# Patient Record
Sex: Female | Born: 1945 | Race: White | Hispanic: No | Smoking: Former smoker
Health system: Southern US, Community
[De-identification: ages and names within clinical notes are randomized; demographics above are authoritative.]

## PROBLEM LIST (undated history)

## (undated) DIAGNOSIS — I10 Essential (primary) hypertension: Secondary | ICD-10-CM

## (undated) DIAGNOSIS — D0220 Carcinoma in situ of unspecified bronchus and lung: Secondary | ICD-10-CM

## (undated) DIAGNOSIS — D630 Anemia in neoplastic disease: Secondary | ICD-10-CM

## (undated) DIAGNOSIS — F3289 Other specified depressive episodes: Secondary | ICD-10-CM

## (undated) DIAGNOSIS — R5382 Chronic fatigue, unspecified: Secondary | ICD-10-CM

## (undated) DIAGNOSIS — C3490 Malignant neoplasm of unspecified part of unspecified bronchus or lung: Secondary | ICD-10-CM

## (undated) DIAGNOSIS — K2100 Gastro-esophageal reflux disease with esophagitis, without bleeding: Secondary | ICD-10-CM

## (undated) DIAGNOSIS — M62838 Other muscle spasm: Secondary | ICD-10-CM

## (undated) DIAGNOSIS — Z9289 Personal history of other medical treatment: Secondary | ICD-10-CM

## (undated) DIAGNOSIS — F419 Anxiety disorder, unspecified: Secondary | ICD-10-CM

## (undated) DIAGNOSIS — G2581 Restless legs syndrome: Secondary | ICD-10-CM

## (undated) DIAGNOSIS — M899 Disorder of bone, unspecified: Secondary | ICD-10-CM

## (undated) DIAGNOSIS — E559 Vitamin D deficiency, unspecified: Secondary | ICD-10-CM

## (undated) DIAGNOSIS — E1149 Type 2 diabetes mellitus with other diabetic neurological complication: Secondary | ICD-10-CM

## (undated) DIAGNOSIS — M6281 Muscle weakness (generalized): Secondary | ICD-10-CM

## (undated) DIAGNOSIS — R55 Syncope and collapse: Secondary | ICD-10-CM

## (undated) DIAGNOSIS — J4489 Other specified chronic obstructive pulmonary disease: Secondary | ICD-10-CM

## (undated) DIAGNOSIS — M546 Pain in thoracic spine: Secondary | ICD-10-CM

## (undated) DIAGNOSIS — M5136 Other intervertebral disc degeneration, lumbar region: Secondary | ICD-10-CM

## (undated) DIAGNOSIS — M1990 Unspecified osteoarthritis, unspecified site: Secondary | ICD-10-CM

## (undated) DIAGNOSIS — T451X5A Adverse effect of antineoplastic and immunosuppressive drugs, initial encounter: Secondary | ICD-10-CM

## (undated) DIAGNOSIS — H9190 Unspecified hearing loss, unspecified ear: Secondary | ICD-10-CM

## (undated) DIAGNOSIS — R3981 Functional urinary incontinence: Secondary | ICD-10-CM

## (undated) DIAGNOSIS — M419 Scoliosis, unspecified: Secondary | ICD-10-CM

## (undated) DIAGNOSIS — N159 Renal tubulo-interstitial disease, unspecified: Secondary | ICD-10-CM

## (undated) DIAGNOSIS — E785 Hyperlipidemia, unspecified: Secondary | ICD-10-CM

## (undated) DIAGNOSIS — I509 Heart failure, unspecified: Secondary | ICD-10-CM

## (undated) DIAGNOSIS — I2510 Atherosclerotic heart disease of native coronary artery without angina pectoris: Secondary | ICD-10-CM

## (undated) DIAGNOSIS — M545 Low back pain, unspecified: Secondary | ICD-10-CM

## (undated) DIAGNOSIS — K5900 Constipation, unspecified: Secondary | ICD-10-CM

## (undated) DIAGNOSIS — I4891 Unspecified atrial fibrillation: Secondary | ICD-10-CM

## (undated) DIAGNOSIS — R0602 Shortness of breath: Secondary | ICD-10-CM

## (undated) DIAGNOSIS — I8390 Asymptomatic varicose veins of unspecified lower extremity: Secondary | ICD-10-CM

## (undated) DIAGNOSIS — M949 Disorder of cartilage, unspecified: Secondary | ICD-10-CM

## (undated) DIAGNOSIS — I70209 Unspecified atherosclerosis of native arteries of extremities, unspecified extremity: Secondary | ICD-10-CM

## (undated) DIAGNOSIS — M25559 Pain in unspecified hip: Secondary | ICD-10-CM

## (undated) DIAGNOSIS — J180 Bronchopneumonia, unspecified organism: Secondary | ICD-10-CM

## (undated) DIAGNOSIS — R7989 Other specified abnormal findings of blood chemistry: Secondary | ICD-10-CM

## (undated) DIAGNOSIS — G609 Hereditary and idiopathic neuropathy, unspecified: Secondary | ICD-10-CM

## (undated) DIAGNOSIS — M51369 Other intervertebral disc degeneration, lumbar region without mention of lumbar back pain or lower extremity pain: Secondary | ICD-10-CM

## (undated) DIAGNOSIS — K130 Diseases of lips: Secondary | ICD-10-CM

## (undated) DIAGNOSIS — K219 Gastro-esophageal reflux disease without esophagitis: Secondary | ICD-10-CM

## (undated) DIAGNOSIS — J439 Emphysema, unspecified: Secondary | ICD-10-CM

## (undated) DIAGNOSIS — M810 Age-related osteoporosis without current pathological fracture: Secondary | ICD-10-CM

## (undated) DIAGNOSIS — E119 Type 2 diabetes mellitus without complications: Secondary | ICD-10-CM

## (undated) DIAGNOSIS — M797 Fibromyalgia: Secondary | ICD-10-CM

## (undated) DIAGNOSIS — T8859XA Other complications of anesthesia, initial encounter: Secondary | ICD-10-CM

## (undated) DIAGNOSIS — F99 Mental disorder, not otherwise specified: Secondary | ICD-10-CM

## (undated) DIAGNOSIS — R519 Headache, unspecified: Secondary | ICD-10-CM

## (undated) DIAGNOSIS — I82409 Acute embolism and thrombosis of unspecified deep veins of unspecified lower extremity: Secondary | ICD-10-CM

## (undated) DIAGNOSIS — J449 Chronic obstructive pulmonary disease, unspecified: Secondary | ICD-10-CM

## (undated) DIAGNOSIS — Z1329 Encounter for screening for other suspected endocrine disorder: Secondary | ICD-10-CM

## (undated) DIAGNOSIS — F329 Major depressive disorder, single episode, unspecified: Secondary | ICD-10-CM

## (undated) DIAGNOSIS — B029 Zoster without complications: Secondary | ICD-10-CM

## (undated) DIAGNOSIS — Z8709 Personal history of other diseases of the respiratory system: Secondary | ICD-10-CM

## (undated) DIAGNOSIS — G4710 Hypersomnia, unspecified: Secondary | ICD-10-CM

## (undated) DIAGNOSIS — R51 Headache: Secondary | ICD-10-CM

## (undated) DIAGNOSIS — C3411 Malignant neoplasm of upper lobe, right bronchus or lung: Secondary | ICD-10-CM

## (undated) DIAGNOSIS — Z86718 Personal history of other venous thrombosis and embolism: Secondary | ICD-10-CM

## (undated) DIAGNOSIS — H9191 Unspecified hearing loss, right ear: Secondary | ICD-10-CM

## (undated) DIAGNOSIS — T4145XA Adverse effect of unspecified anesthetic, initial encounter: Secondary | ICD-10-CM

## (undated) DIAGNOSIS — G8929 Other chronic pain: Secondary | ICD-10-CM

## (undated) DIAGNOSIS — E1142 Type 2 diabetes mellitus with diabetic polyneuropathy: Secondary | ICD-10-CM

## (undated) DIAGNOSIS — IMO0001 Reserved for inherently not codable concepts without codable children: Secondary | ICD-10-CM

## (undated) DIAGNOSIS — Z923 Personal history of irradiation: Secondary | ICD-10-CM

## (undated) DIAGNOSIS — I739 Peripheral vascular disease, unspecified: Secondary | ICD-10-CM

## (undated) HISTORY — PX: SEPTOPLASTY: SUR1290

## (undated) HISTORY — DX: Malignant neoplasm of unspecified part of unspecified bronchus or lung: C34.90

## (undated) HISTORY — DX: Acute embolism and thrombosis of unspecified deep veins of unspecified lower extremity: I82.409

## (undated) HISTORY — DX: Atherosclerotic heart disease of native coronary artery without angina pectoris: I25.10

## (undated) HISTORY — DX: Restless legs syndrome: G25.81

## (undated) HISTORY — DX: Hypersomnia, unspecified: G47.10

## (undated) HISTORY — DX: Constipation, unspecified: K59.00

## (undated) HISTORY — DX: Other specified abnormal findings of blood chemistry: R79.89

## (undated) HISTORY — DX: Disorder of bone, unspecified: M89.9

## (undated) HISTORY — DX: Unspecified osteoarthritis, unspecified site: M19.90

## (undated) HISTORY — DX: Unspecified atrial fibrillation: I48.91

## (undated) HISTORY — DX: Carcinoma in situ of unspecified bronchus and lung: D02.20

## (undated) HISTORY — DX: Hereditary and idiopathic neuropathy, unspecified: G60.9

## (undated) HISTORY — DX: Gastro-esophageal reflux disease without esophagitis: K21.9

## (undated) HISTORY — PX: CARDIAC CATHETERIZATION: SHX172

## (undated) HISTORY — DX: Functional urinary incontinence: R39.81

## (undated) HISTORY — DX: Hyperlipidemia, unspecified: E78.5

## (undated) HISTORY — DX: Unspecified atherosclerosis of native arteries of extremities, unspecified extremity: I70.209

## (undated) HISTORY — DX: Essential (primary) hypertension: I10

## (undated) HISTORY — DX: Encounter for screening for other suspected endocrine disorder: Z13.29

## (undated) HISTORY — DX: Other specified depressive episodes: F32.89

## (undated) HISTORY — DX: Gastro-esophageal reflux disease with esophagitis, without bleeding: K21.00

## (undated) HISTORY — DX: Zoster without complications: B02.9

## (undated) HISTORY — DX: Pain in unspecified hip: M25.559

## (undated) HISTORY — DX: Vitamin D deficiency, unspecified: E55.9

## (undated) HISTORY — DX: Asymptomatic varicose veins of unspecified lower extremity: I83.90

## (undated) HISTORY — DX: Type 2 diabetes mellitus with other diabetic neurological complication: E11.49

## (undated) HISTORY — DX: Gastro-esophageal reflux disease with esophagitis: K21.0

## (undated) HISTORY — DX: Type 2 diabetes mellitus with diabetic polyneuropathy: E11.42

## (undated) HISTORY — DX: Low back pain: M54.5

## (undated) HISTORY — DX: Muscle weakness (generalized): M62.81

## (undated) HISTORY — DX: Personal history of other medical treatment: Z92.89

## (undated) HISTORY — DX: Other chronic pain: G89.29

## (undated) HISTORY — DX: Diseases of lips: K13.0

## (undated) HISTORY — DX: Unspecified hearing loss, unspecified ear: H91.90

## (undated) HISTORY — DX: Pain in thoracic spine: M54.6

## (undated) HISTORY — DX: Low back pain, unspecified: M54.50

## (undated) HISTORY — DX: Major depressive disorder, single episode, unspecified: F32.9

## (undated) HISTORY — DX: Chronic obstructive pulmonary disease, unspecified: J44.9

## (undated) HISTORY — DX: Chronic fatigue, unspecified: R53.82

## (undated) HISTORY — DX: Anemia in neoplastic disease: D63.0

## (undated) HISTORY — DX: Personal history of irradiation: Z92.3

## (undated) HISTORY — DX: Other muscle spasm: M62.838

## (undated) HISTORY — DX: Reserved for inherently not codable concepts without codable children: IMO0001

## (undated) HISTORY — DX: Other specified chronic obstructive pulmonary disease: J44.89

## (undated) HISTORY — DX: Disorder of cartilage, unspecified: M94.9

## (undated) HISTORY — DX: Emphysema, unspecified: J43.9

## (undated) HISTORY — DX: Heart failure, unspecified: I50.9

## (undated) HISTORY — DX: Type 2 diabetes mellitus without complications: E11.9

## (undated) HISTORY — DX: Malignant neoplasm of upper lobe, right bronchus or lung: C34.11

---

## 1978-12-12 HISTORY — PX: TUBAL LIGATION: SHX77

## 1978-12-12 HISTORY — PX: DILATION AND CURETTAGE OF UTERUS: SHX78

## 1984-04-12 HISTORY — PX: LUNG LOBECTOMY: SHX167

## 1997-06-26 ENCOUNTER — Ambulatory Visit (HOSPITAL_COMMUNITY): Admission: RE | Admit: 1997-06-26 | Discharge: 1997-06-26 | Payer: Self-pay | Admitting: Family Medicine

## 1997-10-21 ENCOUNTER — Encounter: Admission: RE | Admit: 1997-10-21 | Discharge: 1997-10-21 | Payer: Self-pay | Admitting: Family Medicine

## 1997-11-20 ENCOUNTER — Encounter: Admission: RE | Admit: 1997-11-20 | Discharge: 1997-11-20 | Payer: Self-pay | Admitting: Family Medicine

## 1997-12-05 ENCOUNTER — Emergency Department (HOSPITAL_COMMUNITY): Admission: EM | Admit: 1997-12-05 | Discharge: 1997-12-05 | Payer: Self-pay | Admitting: Emergency Medicine

## 1997-12-11 ENCOUNTER — Encounter: Admission: RE | Admit: 1997-12-11 | Discharge: 1997-12-11 | Payer: Self-pay | Admitting: Family Medicine

## 1998-01-02 ENCOUNTER — Encounter: Admission: RE | Admit: 1998-01-02 | Discharge: 1998-01-02 | Payer: Self-pay | Admitting: Family Medicine

## 1998-01-21 ENCOUNTER — Encounter: Admission: RE | Admit: 1998-01-21 | Discharge: 1998-01-21 | Payer: Self-pay | Admitting: Family Medicine

## 1998-01-28 ENCOUNTER — Encounter: Admission: RE | Admit: 1998-01-28 | Discharge: 1998-01-28 | Payer: Self-pay | Admitting: Sports Medicine

## 1998-02-04 ENCOUNTER — Encounter: Admission: RE | Admit: 1998-02-04 | Discharge: 1998-02-04 | Payer: Self-pay | Admitting: Sports Medicine

## 1998-02-28 ENCOUNTER — Encounter: Admission: RE | Admit: 1998-02-28 | Discharge: 1998-02-28 | Payer: Self-pay | Admitting: Family Medicine

## 1998-03-20 ENCOUNTER — Encounter: Admission: RE | Admit: 1998-03-20 | Discharge: 1998-03-20 | Payer: Self-pay | Admitting: Family Medicine

## 1998-04-28 ENCOUNTER — Encounter: Admission: RE | Admit: 1998-04-28 | Discharge: 1998-04-28 | Payer: Self-pay | Admitting: Family Medicine

## 1998-05-08 ENCOUNTER — Encounter: Admission: RE | Admit: 1998-05-08 | Discharge: 1998-05-08 | Payer: Self-pay | Admitting: Family Medicine

## 1998-05-20 ENCOUNTER — Encounter: Admission: RE | Admit: 1998-05-20 | Discharge: 1998-05-20 | Payer: Self-pay | Admitting: Family Medicine

## 1998-05-22 ENCOUNTER — Encounter: Admission: RE | Admit: 1998-05-22 | Discharge: 1998-05-22 | Payer: Self-pay | Admitting: Family Medicine

## 1998-06-03 ENCOUNTER — Encounter: Admission: RE | Admit: 1998-06-03 | Discharge: 1998-06-03 | Payer: Self-pay | Admitting: Family Medicine

## 1998-06-13 ENCOUNTER — Encounter: Admission: RE | Admit: 1998-06-13 | Discharge: 1998-06-13 | Payer: Self-pay | Admitting: Family Medicine

## 1998-06-19 ENCOUNTER — Ambulatory Visit (HOSPITAL_COMMUNITY): Admission: RE | Admit: 1998-06-19 | Discharge: 1998-06-19 | Payer: Self-pay | Admitting: Family Medicine

## 1998-06-26 ENCOUNTER — Encounter: Admission: RE | Admit: 1998-06-26 | Discharge: 1998-06-26 | Payer: Self-pay | Admitting: Family Medicine

## 1998-08-13 ENCOUNTER — Encounter: Admission: RE | Admit: 1998-08-13 | Discharge: 1998-08-13 | Payer: Self-pay | Admitting: Family Medicine

## 1998-08-28 ENCOUNTER — Encounter: Admission: RE | Admit: 1998-08-28 | Discharge: 1998-08-28 | Payer: Self-pay | Admitting: Family Medicine

## 1998-09-23 ENCOUNTER — Encounter: Admission: RE | Admit: 1998-09-23 | Discharge: 1998-09-23 | Payer: Self-pay | Admitting: Sports Medicine

## 1998-09-26 ENCOUNTER — Encounter: Admission: RE | Admit: 1998-09-26 | Discharge: 1998-09-26 | Payer: Self-pay | Admitting: Family Medicine

## 1998-10-07 ENCOUNTER — Encounter: Admission: RE | Admit: 1998-10-07 | Discharge: 1998-10-07 | Payer: Self-pay | Admitting: Sports Medicine

## 1998-10-30 ENCOUNTER — Encounter: Admission: RE | Admit: 1998-10-30 | Discharge: 1998-10-30 | Payer: Self-pay | Admitting: Family Medicine

## 1999-01-23 ENCOUNTER — Encounter: Admission: RE | Admit: 1999-01-23 | Discharge: 1999-01-23 | Payer: Self-pay | Admitting: Family Medicine

## 1999-03-04 ENCOUNTER — Emergency Department (HOSPITAL_COMMUNITY): Admission: EM | Admit: 1999-03-04 | Discharge: 1999-03-04 | Payer: Self-pay | Admitting: Podiatry

## 1999-03-04 ENCOUNTER — Encounter: Payer: Self-pay | Admitting: *Deleted

## 1999-03-12 ENCOUNTER — Encounter: Admission: RE | Admit: 1999-03-12 | Discharge: 1999-03-12 | Payer: Self-pay | Admitting: Family Medicine

## 1999-03-24 ENCOUNTER — Encounter: Admission: RE | Admit: 1999-03-24 | Discharge: 1999-03-24 | Payer: Self-pay | Admitting: Sports Medicine

## 1999-04-17 ENCOUNTER — Ambulatory Visit: Admission: RE | Admit: 1999-04-17 | Discharge: 1999-04-17 | Payer: Self-pay | Admitting: Pulmonary Disease

## 1999-04-21 ENCOUNTER — Encounter: Admission: RE | Admit: 1999-04-21 | Discharge: 1999-04-21 | Payer: Self-pay | Admitting: Sports Medicine

## 1999-04-22 ENCOUNTER — Encounter: Admission: RE | Admit: 1999-04-22 | Discharge: 1999-04-22 | Payer: Self-pay | Admitting: Family Medicine

## 1999-05-05 ENCOUNTER — Encounter: Admission: RE | Admit: 1999-05-05 | Discharge: 1999-05-05 | Payer: Self-pay | Admitting: Sports Medicine

## 1999-05-14 ENCOUNTER — Encounter: Admission: RE | Admit: 1999-05-14 | Discharge: 1999-05-14 | Payer: Self-pay | Admitting: Otolaryngology

## 1999-05-14 ENCOUNTER — Encounter: Payer: Self-pay | Admitting: Otolaryngology

## 1999-07-08 ENCOUNTER — Ambulatory Visit (HOSPITAL_COMMUNITY): Admission: RE | Admit: 1999-07-08 | Discharge: 1999-07-09 | Payer: Self-pay | Admitting: Cardiology

## 1999-07-20 ENCOUNTER — Ambulatory Visit (HOSPITAL_COMMUNITY): Admission: RE | Admit: 1999-07-20 | Discharge: 1999-07-20 | Payer: Self-pay | Admitting: Family Medicine

## 1999-08-03 ENCOUNTER — Encounter: Admission: RE | Admit: 1999-08-03 | Discharge: 1999-08-03 | Payer: Self-pay | Admitting: Family Medicine

## 1999-08-04 ENCOUNTER — Encounter: Admission: RE | Admit: 1999-08-04 | Discharge: 1999-08-04 | Payer: Self-pay | Admitting: Sports Medicine

## 1999-08-27 ENCOUNTER — Encounter: Admission: RE | Admit: 1999-08-27 | Discharge: 1999-08-27 | Payer: Self-pay | Admitting: Family Medicine

## 1999-08-28 ENCOUNTER — Encounter: Payer: Self-pay | Admitting: Pulmonary Disease

## 1999-08-28 ENCOUNTER — Ambulatory Visit (HOSPITAL_COMMUNITY): Admission: RE | Admit: 1999-08-28 | Discharge: 1999-08-28 | Payer: Self-pay | Admitting: Pulmonary Disease

## 1999-09-11 ENCOUNTER — Encounter (INDEPENDENT_AMBULATORY_CARE_PROVIDER_SITE_OTHER): Payer: Self-pay | Admitting: *Deleted

## 1999-09-11 LAB — CONVERTED CEMR LAB

## 1999-09-17 ENCOUNTER — Encounter: Admission: RE | Admit: 1999-09-17 | Discharge: 1999-09-17 | Payer: Self-pay | Admitting: Family Medicine

## 1999-09-17 ENCOUNTER — Other Ambulatory Visit: Admission: RE | Admit: 1999-09-17 | Discharge: 1999-09-17 | Payer: Self-pay | Admitting: Family Medicine

## 1999-09-29 ENCOUNTER — Encounter: Payer: Self-pay | Admitting: Emergency Medicine

## 1999-09-29 ENCOUNTER — Emergency Department (HOSPITAL_COMMUNITY): Admission: EM | Admit: 1999-09-29 | Discharge: 1999-09-29 | Payer: Self-pay | Admitting: Emergency Medicine

## 1999-10-01 ENCOUNTER — Encounter: Admission: RE | Admit: 1999-10-01 | Discharge: 1999-10-01 | Payer: Self-pay | Admitting: Family Medicine

## 1999-10-15 ENCOUNTER — Encounter: Admission: RE | Admit: 1999-10-15 | Discharge: 1999-10-15 | Payer: Self-pay | Admitting: Family Medicine

## 2000-01-08 ENCOUNTER — Encounter: Payer: Self-pay | Admitting: Otolaryngology

## 2000-01-08 ENCOUNTER — Encounter: Admission: RE | Admit: 2000-01-08 | Discharge: 2000-01-08 | Payer: Self-pay | Admitting: Otolaryngology

## 2000-01-25 ENCOUNTER — Encounter: Admission: RE | Admit: 2000-01-25 | Discharge: 2000-01-25 | Payer: Self-pay | Admitting: Family Medicine

## 2000-02-01 ENCOUNTER — Encounter: Admission: RE | Admit: 2000-02-01 | Discharge: 2000-02-01 | Payer: Self-pay | Admitting: *Deleted

## 2000-02-08 ENCOUNTER — Encounter: Admission: RE | Admit: 2000-02-08 | Discharge: 2000-02-08 | Payer: Self-pay | Admitting: Family Medicine

## 2000-03-01 ENCOUNTER — Encounter: Admission: RE | Admit: 2000-03-01 | Discharge: 2000-03-01 | Payer: Self-pay | Admitting: Sports Medicine

## 2000-03-16 ENCOUNTER — Emergency Department (HOSPITAL_COMMUNITY): Admission: EM | Admit: 2000-03-16 | Discharge: 2000-03-16 | Payer: Self-pay | Admitting: Emergency Medicine

## 2000-03-16 ENCOUNTER — Encounter: Payer: Self-pay | Admitting: Emergency Medicine

## 2000-03-21 ENCOUNTER — Encounter: Admission: RE | Admit: 2000-03-21 | Discharge: 2000-03-21 | Payer: Self-pay | Admitting: Family Medicine

## 2000-04-07 ENCOUNTER — Encounter: Admission: RE | Admit: 2000-04-07 | Discharge: 2000-04-07 | Payer: Self-pay | Admitting: Family Medicine

## 2000-05-25 ENCOUNTER — Ambulatory Visit: Admission: RE | Admit: 2000-05-25 | Discharge: 2000-05-25 | Payer: Self-pay | Admitting: Pulmonary Disease

## 2000-05-26 ENCOUNTER — Encounter: Admission: RE | Admit: 2000-05-26 | Discharge: 2000-05-26 | Payer: Self-pay | Admitting: Family Medicine

## 2000-06-09 ENCOUNTER — Encounter: Admission: RE | Admit: 2000-06-09 | Discharge: 2000-06-09 | Payer: Self-pay | Admitting: Family Medicine

## 2000-08-29 ENCOUNTER — Encounter: Admission: RE | Admit: 2000-08-29 | Discharge: 2000-08-29 | Payer: Self-pay | Admitting: Family Medicine

## 2000-08-31 ENCOUNTER — Encounter: Admission: RE | Admit: 2000-08-31 | Discharge: 2000-08-31 | Payer: Self-pay | Admitting: Otolaryngology

## 2000-08-31 ENCOUNTER — Encounter: Payer: Self-pay | Admitting: Otolaryngology

## 2000-09-15 ENCOUNTER — Encounter: Admission: RE | Admit: 2000-09-15 | Discharge: 2000-09-15 | Payer: Self-pay | Admitting: Family Medicine

## 2000-10-25 ENCOUNTER — Encounter: Admission: RE | Admit: 2000-10-25 | Discharge: 2000-10-25 | Payer: Self-pay | Admitting: Family Medicine

## 2000-11-16 ENCOUNTER — Encounter: Admission: RE | Admit: 2000-11-16 | Discharge: 2000-11-16 | Payer: Self-pay | Admitting: Family Medicine

## 2000-12-07 ENCOUNTER — Encounter: Admission: RE | Admit: 2000-12-07 | Discharge: 2000-12-07 | Payer: Self-pay | Admitting: Family Medicine

## 2001-02-09 ENCOUNTER — Ambulatory Visit (HOSPITAL_COMMUNITY): Admission: RE | Admit: 2001-02-09 | Discharge: 2001-02-09 | Payer: Self-pay | Admitting: Family Medicine

## 2001-02-09 ENCOUNTER — Encounter: Admission: RE | Admit: 2001-02-09 | Discharge: 2001-02-09 | Payer: Self-pay | Admitting: Family Medicine

## 2001-02-10 ENCOUNTER — Ambulatory Visit (HOSPITAL_COMMUNITY): Admission: RE | Admit: 2001-02-10 | Discharge: 2001-02-10 | Payer: Self-pay | Admitting: Pulmonary Disease

## 2001-02-10 ENCOUNTER — Encounter: Payer: Self-pay | Admitting: Pulmonary Disease

## 2001-03-07 ENCOUNTER — Encounter: Admission: RE | Admit: 2001-03-07 | Discharge: 2001-03-07 | Payer: Self-pay | Admitting: Family Medicine

## 2001-03-20 ENCOUNTER — Encounter: Admission: RE | Admit: 2001-03-20 | Discharge: 2001-03-20 | Payer: Self-pay | Admitting: Family Medicine

## 2001-03-28 ENCOUNTER — Encounter: Admission: RE | Admit: 2001-03-28 | Discharge: 2001-03-28 | Payer: Self-pay | Admitting: Family Medicine

## 2001-03-31 ENCOUNTER — Encounter: Payer: Self-pay | Admitting: Sports Medicine

## 2001-03-31 ENCOUNTER — Encounter: Admission: RE | Admit: 2001-03-31 | Discharge: 2001-03-31 | Payer: Self-pay | Admitting: Sports Medicine

## 2001-04-10 ENCOUNTER — Encounter: Admission: RE | Admit: 2001-04-10 | Discharge: 2001-04-10 | Payer: Self-pay | Admitting: Family Medicine

## 2001-04-18 ENCOUNTER — Encounter: Admission: RE | Admit: 2001-04-18 | Discharge: 2001-04-18 | Payer: Self-pay | Admitting: Family Medicine

## 2001-04-19 ENCOUNTER — Encounter: Admission: RE | Admit: 2001-04-19 | Discharge: 2001-04-19 | Payer: Self-pay | Admitting: Family Medicine

## 2001-04-27 ENCOUNTER — Encounter: Admission: RE | Admit: 2001-04-27 | Discharge: 2001-04-27 | Payer: Self-pay | Admitting: Family Medicine

## 2001-05-18 ENCOUNTER — Encounter: Admission: RE | Admit: 2001-05-18 | Discharge: 2001-05-18 | Payer: Self-pay | Admitting: Family Medicine

## 2001-05-18 ENCOUNTER — Encounter (INDEPENDENT_AMBULATORY_CARE_PROVIDER_SITE_OTHER): Payer: Self-pay | Admitting: Specialist

## 2001-05-26 ENCOUNTER — Encounter: Admission: RE | Admit: 2001-05-26 | Discharge: 2001-05-26 | Payer: Self-pay | Admitting: Family Medicine

## 2001-06-01 ENCOUNTER — Encounter: Admission: RE | Admit: 2001-06-01 | Discharge: 2001-06-01 | Payer: Self-pay | Admitting: Family Medicine

## 2001-06-15 ENCOUNTER — Encounter: Admission: RE | Admit: 2001-06-15 | Discharge: 2001-06-15 | Payer: Self-pay | Admitting: Sports Medicine

## 2001-06-15 ENCOUNTER — Encounter: Admission: RE | Admit: 2001-06-15 | Discharge: 2001-06-15 | Payer: Self-pay | Admitting: *Deleted

## 2001-06-20 ENCOUNTER — Encounter: Admission: RE | Admit: 2001-06-20 | Discharge: 2001-09-18 | Payer: Self-pay | Admitting: *Deleted

## 2001-06-22 ENCOUNTER — Encounter: Admission: RE | Admit: 2001-06-22 | Discharge: 2001-06-22 | Payer: Self-pay | Admitting: Family Medicine

## 2001-06-26 ENCOUNTER — Encounter: Admission: RE | Admit: 2001-06-26 | Discharge: 2001-06-26 | Payer: Self-pay | Admitting: *Deleted

## 2001-10-26 ENCOUNTER — Encounter: Admission: RE | Admit: 2001-10-26 | Discharge: 2001-10-26 | Payer: Self-pay | Admitting: Otolaryngology

## 2001-10-26 ENCOUNTER — Encounter: Payer: Self-pay | Admitting: Otolaryngology

## 2002-01-16 ENCOUNTER — Encounter: Payer: Self-pay | Admitting: Internal Medicine

## 2002-01-16 ENCOUNTER — Encounter: Admission: RE | Admit: 2002-01-16 | Discharge: 2002-01-16 | Payer: Self-pay | Admitting: Internal Medicine

## 2002-02-07 ENCOUNTER — Ambulatory Visit: Admission: RE | Admit: 2002-02-07 | Discharge: 2002-02-07 | Payer: Self-pay | Admitting: Pulmonary Disease

## 2002-09-24 ENCOUNTER — Emergency Department (HOSPITAL_COMMUNITY): Admission: EM | Admit: 2002-09-24 | Discharge: 2002-09-24 | Payer: Self-pay

## 2002-11-08 ENCOUNTER — Encounter: Admission: RE | Admit: 2002-11-08 | Discharge: 2002-11-08 | Payer: Self-pay | Admitting: Family Medicine

## 2002-11-13 ENCOUNTER — Encounter: Admission: RE | Admit: 2002-11-13 | Discharge: 2002-11-13 | Payer: Self-pay | Admitting: Family Medicine

## 2002-11-27 ENCOUNTER — Encounter: Admission: RE | Admit: 2002-11-27 | Discharge: 2002-11-27 | Payer: Self-pay | Admitting: Family Medicine

## 2002-12-18 ENCOUNTER — Encounter: Admission: RE | Admit: 2002-12-18 | Discharge: 2002-12-18 | Payer: Self-pay | Admitting: Family Medicine

## 2003-01-11 ENCOUNTER — Encounter: Payer: Self-pay | Admitting: Internal Medicine

## 2003-01-11 ENCOUNTER — Encounter: Admission: RE | Admit: 2003-01-11 | Discharge: 2003-01-11 | Payer: Self-pay | Admitting: Internal Medicine

## 2003-01-15 ENCOUNTER — Encounter: Admission: RE | Admit: 2003-01-15 | Discharge: 2003-01-15 | Payer: Self-pay | Admitting: Sports Medicine

## 2003-04-19 ENCOUNTER — Ambulatory Visit (HOSPITAL_COMMUNITY)
Admission: RE | Admit: 2003-04-19 | Discharge: 2003-04-19 | Payer: Self-pay | Admitting: Physical Medicine and Rehabilitation

## 2003-05-09 ENCOUNTER — Ambulatory Visit (HOSPITAL_COMMUNITY): Admission: RE | Admit: 2003-05-09 | Discharge: 2003-05-09 | Payer: Self-pay | Admitting: Cardiology

## 2003-08-23 ENCOUNTER — Encounter
Admission: RE | Admit: 2003-08-23 | Discharge: 2003-09-20 | Payer: Self-pay | Admitting: Physical Medicine and Rehabilitation

## 2003-09-05 ENCOUNTER — Other Ambulatory Visit: Admission: RE | Admit: 2003-09-05 | Discharge: 2003-09-05 | Payer: Self-pay | Admitting: Nephrology

## 2004-02-20 ENCOUNTER — Ambulatory Visit: Payer: Self-pay | Admitting: Pulmonary Disease

## 2004-03-03 ENCOUNTER — Ambulatory Visit (HOSPITAL_COMMUNITY): Admission: RE | Admit: 2004-03-03 | Discharge: 2004-03-03 | Payer: Self-pay | Admitting: Pulmonary Disease

## 2004-03-18 ENCOUNTER — Ambulatory Visit: Payer: Self-pay | Admitting: Pulmonary Disease

## 2004-07-13 ENCOUNTER — Ambulatory Visit: Payer: Self-pay | Admitting: Internal Medicine

## 2004-08-17 ENCOUNTER — Ambulatory Visit: Payer: Self-pay | Admitting: Cardiology

## 2004-08-26 ENCOUNTER — Ambulatory Visit: Payer: Self-pay

## 2004-09-15 ENCOUNTER — Other Ambulatory Visit: Admission: RE | Admit: 2004-09-15 | Discharge: 2004-09-15 | Payer: Self-pay | Admitting: Nephrology

## 2004-09-24 ENCOUNTER — Ambulatory Visit: Payer: Self-pay | Admitting: Cardiology

## 2004-10-05 ENCOUNTER — Ambulatory Visit: Payer: Self-pay | Admitting: Pulmonary Disease

## 2004-10-15 ENCOUNTER — Ambulatory Visit: Payer: Self-pay | Admitting: Cardiology

## 2004-11-16 ENCOUNTER — Ambulatory Visit: Payer: Self-pay | Admitting: Pulmonary Disease

## 2004-12-16 ENCOUNTER — Ambulatory Visit: Payer: Self-pay | Admitting: Pulmonary Disease

## 2004-12-21 ENCOUNTER — Ambulatory Visit: Payer: Self-pay | Admitting: Internal Medicine

## 2004-12-28 ENCOUNTER — Ambulatory Visit: Payer: Self-pay | Admitting: Internal Medicine

## 2005-01-19 ENCOUNTER — Ambulatory Visit: Payer: Self-pay | Admitting: Internal Medicine

## 2005-01-19 ENCOUNTER — Inpatient Hospital Stay (HOSPITAL_COMMUNITY): Admission: RE | Admit: 2005-01-19 | Discharge: 2005-01-23 | Payer: Self-pay | Admitting: Internal Medicine

## 2005-01-20 ENCOUNTER — Encounter: Payer: Self-pay | Admitting: Cardiology

## 2005-01-20 ENCOUNTER — Ambulatory Visit: Payer: Self-pay | Admitting: Internal Medicine

## 2005-01-22 ENCOUNTER — Ambulatory Visit: Payer: Self-pay | Admitting: Critical Care Medicine

## 2005-01-29 ENCOUNTER — Ambulatory Visit: Payer: Self-pay | Admitting: Internal Medicine

## 2005-04-19 ENCOUNTER — Ambulatory Visit: Payer: Self-pay | Admitting: Internal Medicine

## 2005-05-14 ENCOUNTER — Ambulatory Visit: Payer: Self-pay | Admitting: Pulmonary Disease

## 2005-05-15 ENCOUNTER — Emergency Department (HOSPITAL_COMMUNITY): Admission: EM | Admit: 2005-05-15 | Discharge: 2005-05-15 | Payer: Self-pay | Admitting: Emergency Medicine

## 2005-07-30 ENCOUNTER — Ambulatory Visit: Payer: Self-pay | Admitting: Pulmonary Disease

## 2005-08-03 ENCOUNTER — Ambulatory Visit: Payer: Self-pay | Admitting: Internal Medicine

## 2005-08-11 ENCOUNTER — Encounter: Payer: Self-pay | Admitting: Emergency Medicine

## 2005-08-16 ENCOUNTER — Ambulatory Visit: Payer: Self-pay | Admitting: Internal Medicine

## 2005-08-20 ENCOUNTER — Ambulatory Visit: Payer: Self-pay | Admitting: Internal Medicine

## 2005-09-13 ENCOUNTER — Ambulatory Visit: Payer: Self-pay | Admitting: Internal Medicine

## 2005-09-28 ENCOUNTER — Encounter (HOSPITAL_COMMUNITY)
Admission: RE | Admit: 2005-09-28 | Discharge: 2005-12-23 | Payer: Self-pay | Admitting: Physical Medicine and Rehabilitation

## 2005-10-08 ENCOUNTER — Ambulatory Visit: Payer: Self-pay | Admitting: Pulmonary Disease

## 2005-11-08 ENCOUNTER — Ambulatory Visit: Payer: Self-pay | Admitting: Internal Medicine

## 2005-12-16 ENCOUNTER — Ambulatory Visit: Payer: Self-pay | Admitting: Internal Medicine

## 2005-12-28 ENCOUNTER — Ambulatory Visit: Payer: Self-pay | Admitting: Pulmonary Disease

## 2006-01-13 ENCOUNTER — Ambulatory Visit: Payer: Self-pay | Admitting: Cardiology

## 2006-01-18 ENCOUNTER — Ambulatory Visit: Payer: Self-pay

## 2006-02-01 ENCOUNTER — Ambulatory Visit: Payer: Self-pay | Admitting: Pulmonary Disease

## 2006-02-23 ENCOUNTER — Ambulatory Visit: Payer: Self-pay | Admitting: Cardiology

## 2006-03-23 ENCOUNTER — Ambulatory Visit: Payer: Self-pay | Admitting: Internal Medicine

## 2006-03-30 ENCOUNTER — Ambulatory Visit: Payer: Self-pay | Admitting: Pulmonary Disease

## 2006-05-10 ENCOUNTER — Ambulatory Visit: Payer: Self-pay | Admitting: Internal Medicine

## 2006-05-10 LAB — CONVERTED CEMR LAB
ALT: 15 units/L (ref 0–40)
AST: 22 units/L (ref 0–37)
BUN: 14 mg/dL (ref 6–23)
CO2: 28 meq/L (ref 19–32)
Calcium: 9.5 mg/dL (ref 8.4–10.5)
Chloride: 102 meq/L (ref 96–112)
Cholesterol: 132 mg/dL (ref 0–200)
Creatinine, Ser: 0.8 mg/dL (ref 0.4–1.2)
GFR calc Af Amer: 93 mL/min
GFR calc non Af Amer: 77 mL/min
Glucose, Bld: 106 mg/dL — ABNORMAL HIGH (ref 70–99)
HDL: 47.7 mg/dL (ref >39.0)
Hgb A1c MFr Bld: 6.1 % — ABNORMAL HIGH (ref 4.6–6.0)
LDL Cholesterol: 73 mg/dL (ref 0–99)
Potassium: 3.9 meq/L (ref 3.5–5.1)
Sodium: 137 meq/L (ref 135–145)
Total CHOL/HDL Ratio: 2.8
Triglycerides: 58 mg/dL (ref 0–149)
VLDL: 12 mg/dL (ref 0–40)

## 2006-05-20 ENCOUNTER — Ambulatory Visit: Payer: Self-pay | Admitting: *Deleted

## 2006-05-24 ENCOUNTER — Ambulatory Visit: Payer: Self-pay | Admitting: Internal Medicine

## 2006-06-10 ENCOUNTER — Encounter (INDEPENDENT_AMBULATORY_CARE_PROVIDER_SITE_OTHER): Payer: Self-pay | Admitting: *Deleted

## 2006-06-13 ENCOUNTER — Ambulatory Visit: Payer: Self-pay | Admitting: Internal Medicine

## 2006-06-13 LAB — CONVERTED CEMR LAB
BUN: 17 mg/dL (ref 6–23)
CO2: 30 meq/L (ref 19–32)
Calcium: 9.2 mg/dL (ref 8.4–10.5)
Chloride: 91 meq/L — ABNORMAL LOW (ref 96–112)
Creatinine, Ser: 0.9 mg/dL (ref 0.4–1.2)
GFR calc Af Amer: 82 mL/min
GFR calc non Af Amer: 67 mL/min
Glucose, Bld: 136 mg/dL — ABNORMAL HIGH (ref 70–99)
Potassium: 3.1 meq/L — ABNORMAL LOW (ref 3.5–5.1)
Sodium: 132 meq/L — ABNORMAL LOW (ref 135–145)

## 2006-06-30 ENCOUNTER — Ambulatory Visit: Payer: Self-pay | Admitting: Internal Medicine

## 2006-06-30 LAB — CONVERTED CEMR LAB
BUN: 13 mg/dL
CO2: 28 meq/L
Calcium: 9.5 mg/dL
Chloride: 100 meq/L
Creatinine, Ser: 0.8 mg/dL
GFR calc Af Amer: 93 mL/min
GFR calc non Af Amer: 77 mL/min
Glucose, Bld: 104 mg/dL — ABNORMAL HIGH
Potassium: 4.4 meq/L
Sodium: 137 meq/L

## 2006-08-01 ENCOUNTER — Ambulatory Visit: Payer: Self-pay | Admitting: Internal Medicine

## 2006-10-04 ENCOUNTER — Encounter: Admission: RE | Admit: 2006-10-04 | Discharge: 2006-10-04 | Payer: Self-pay | Admitting: *Deleted

## 2006-10-28 DIAGNOSIS — J449 Chronic obstructive pulmonary disease, unspecified: Secondary | ICD-10-CM

## 2006-10-28 DIAGNOSIS — E119 Type 2 diabetes mellitus without complications: Secondary | ICD-10-CM | POA: Insufficient documentation

## 2006-10-28 DIAGNOSIS — C3410 Malignant neoplasm of upper lobe, unspecified bronchus or lung: Secondary | ICD-10-CM | POA: Insufficient documentation

## 2006-10-28 DIAGNOSIS — E1142 Type 2 diabetes mellitus with diabetic polyneuropathy: Secondary | ICD-10-CM | POA: Insufficient documentation

## 2006-10-28 DIAGNOSIS — D649 Anemia, unspecified: Secondary | ICD-10-CM

## 2006-10-28 DIAGNOSIS — E785 Hyperlipidemia, unspecified: Secondary | ICD-10-CM

## 2006-10-28 DIAGNOSIS — J329 Chronic sinusitis, unspecified: Secondary | ICD-10-CM

## 2006-10-28 DIAGNOSIS — I2510 Atherosclerotic heart disease of native coronary artery without angina pectoris: Secondary | ICD-10-CM | POA: Insufficient documentation

## 2006-10-28 DIAGNOSIS — I10 Essential (primary) hypertension: Secondary | ICD-10-CM | POA: Insufficient documentation

## 2006-10-28 DIAGNOSIS — G2581 Restless legs syndrome: Secondary | ICD-10-CM

## 2007-02-15 ENCOUNTER — Ambulatory Visit: Payer: Self-pay | Admitting: Cardiology

## 2007-02-21 ENCOUNTER — Ambulatory Visit: Payer: Self-pay

## 2007-03-18 ENCOUNTER — Ambulatory Visit: Payer: Self-pay | Admitting: Cardiovascular Disease

## 2007-04-03 ENCOUNTER — Ambulatory Visit: Payer: Self-pay | Admitting: Cardiovascular Disease

## 2007-05-04 ENCOUNTER — Encounter: Payer: Self-pay | Admitting: Internal Medicine

## 2007-08-25 ENCOUNTER — Ambulatory Visit: Payer: Self-pay | Admitting: Internal Medicine

## 2007-08-25 ENCOUNTER — Ambulatory Visit: Payer: Self-pay | Admitting: Pulmonary Disease

## 2007-08-25 DIAGNOSIS — J209 Acute bronchitis, unspecified: Secondary | ICD-10-CM | POA: Insufficient documentation

## 2007-08-29 ENCOUNTER — Telehealth: Payer: Self-pay | Admitting: Internal Medicine

## 2007-09-01 ENCOUNTER — Telehealth: Payer: Self-pay | Admitting: Adult Health

## 2007-09-14 ENCOUNTER — Encounter: Payer: Self-pay | Admitting: Internal Medicine

## 2007-09-28 ENCOUNTER — Telehealth (INDEPENDENT_AMBULATORY_CARE_PROVIDER_SITE_OTHER): Payer: Self-pay | Admitting: *Deleted

## 2007-10-11 ENCOUNTER — Ambulatory Visit: Payer: Self-pay | Admitting: Vascular Surgery

## 2007-10-31 ENCOUNTER — Ambulatory Visit: Payer: Self-pay | Admitting: Cardiovascular Disease

## 2007-11-17 ENCOUNTER — Encounter: Payer: Self-pay | Admitting: Internal Medicine

## 2007-11-27 ENCOUNTER — Telehealth: Payer: Self-pay | Admitting: Internal Medicine

## 2007-12-26 ENCOUNTER — Ambulatory Visit: Payer: Self-pay | Admitting: Pulmonary Disease

## 2008-01-01 ENCOUNTER — Telehealth (INDEPENDENT_AMBULATORY_CARE_PROVIDER_SITE_OTHER): Payer: Self-pay | Admitting: *Deleted

## 2008-03-19 ENCOUNTER — Encounter: Admission: RE | Admit: 2008-03-19 | Discharge: 2008-03-19 | Payer: Self-pay | Admitting: Sports Medicine

## 2008-03-20 ENCOUNTER — Telehealth (INDEPENDENT_AMBULATORY_CARE_PROVIDER_SITE_OTHER): Payer: Self-pay | Admitting: *Deleted

## 2008-04-02 ENCOUNTER — Telehealth (INDEPENDENT_AMBULATORY_CARE_PROVIDER_SITE_OTHER): Payer: Self-pay | Admitting: *Deleted

## 2008-04-22 ENCOUNTER — Encounter: Admission: RE | Admit: 2008-04-22 | Discharge: 2008-04-22 | Payer: Self-pay | Admitting: Internal Medicine

## 2008-05-23 ENCOUNTER — Encounter
Admission: RE | Admit: 2008-05-23 | Discharge: 2008-05-23 | Payer: Self-pay | Admitting: Physical Medicine and Rehabilitation

## 2008-07-10 ENCOUNTER — Ambulatory Visit: Payer: Self-pay | Admitting: Vascular Surgery

## 2008-07-29 ENCOUNTER — Telehealth: Payer: Self-pay | Admitting: Cardiovascular Disease

## 2008-07-30 ENCOUNTER — Encounter: Payer: Self-pay | Admitting: Cardiovascular Disease

## 2008-07-30 ENCOUNTER — Ambulatory Visit: Payer: Self-pay | Admitting: Cardiovascular Disease

## 2008-08-06 DIAGNOSIS — I739 Peripheral vascular disease, unspecified: Secondary | ICD-10-CM

## 2008-11-21 ENCOUNTER — Ambulatory Visit: Payer: Self-pay | Admitting: Pulmonary Disease

## 2008-12-19 ENCOUNTER — Telehealth (INDEPENDENT_AMBULATORY_CARE_PROVIDER_SITE_OTHER): Payer: Self-pay | Admitting: *Deleted

## 2009-01-01 ENCOUNTER — Ambulatory Visit: Payer: Self-pay | Admitting: Vascular Surgery

## 2009-07-16 ENCOUNTER — Ambulatory Visit: Payer: Self-pay | Admitting: Vascular Surgery

## 2009-07-18 ENCOUNTER — Telehealth: Payer: Self-pay | Admitting: Cardiovascular Disease

## 2009-08-07 ENCOUNTER — Ambulatory Visit: Payer: Self-pay | Admitting: Cardiovascular Disease

## 2009-08-08 ENCOUNTER — Telehealth: Payer: Self-pay | Admitting: Cardiovascular Disease

## 2009-08-11 ENCOUNTER — Ambulatory Visit (HOSPITAL_COMMUNITY): Admission: RE | Admit: 2009-08-11 | Discharge: 2009-08-11 | Payer: Self-pay | Admitting: Cardiovascular Disease

## 2009-08-11 LAB — CONVERTED CEMR LAB
ALT: 18 units/L (ref 0–35)
AST: 26 units/L (ref 0–37)
Albumin: 4.1 g/dL (ref 3.5–5.2)
Alkaline Phosphatase: 97 units/L (ref 39–117)
BUN: 11 mg/dL (ref 6–23)
Basophils Absolute: 0.1 10*3/uL (ref 0.0–0.1)
Basophils Relative: 1.1 % (ref 0.0–3.0)
Bilirubin, Direct: 0.1 mg/dL (ref 0.0–0.3)
CO2: 30 meq/L (ref 19–32)
Calcium: 9.8 mg/dL (ref 8.4–10.5)
Chloride: 101 meq/L (ref 96–112)
Cholesterol: 145 mg/dL (ref 0–200)
Creatinine, Ser: 0.8 mg/dL (ref 0.4–1.2)
Eosinophils Absolute: 0.2 10*3/uL (ref 0.0–0.7)
Eosinophils Relative: 2.2 % (ref 0.0–5.0)
GFR calc non Af Amer: 76.41 mL/min (ref >60)
Glucose, Bld: 100 mg/dL — ABNORMAL HIGH (ref 70–99)
HCT: 35.2 % — ABNORMAL LOW (ref 36.0–46.0)
HDL: 54.4 mg/dL (ref >39.00)
Hemoglobin: 11.7 g/dL — ABNORMAL LOW (ref 12.0–15.0)
Hgb A1c MFr Bld: 5.8 % (ref 4.6–6.5)
LDL Cholesterol: 73 mg/dL (ref 0–99)
Lymphocytes Relative: 27.7 % (ref 12.0–46.0)
Lymphs Abs: 2.9 10*3/uL (ref 0.7–4.0)
MCHC: 33.2 g/dL (ref 30.0–36.0)
MCV: 83.3 fL (ref 78.0–100.0)
Monocytes Absolute: 0.7 10*3/uL (ref 0.1–1.0)
Monocytes Relative: 6.9 % (ref 3.0–12.0)
Neutro Abs: 6.4 10*3/uL (ref 1.4–7.7)
Neutrophils Relative %: 62.1 % (ref 43.0–77.0)
Platelets: 399 10*3/uL (ref 150.0–400.0)
Potassium: 4.2 meq/L (ref 3.5–5.1)
RBC: 4.23 M/uL (ref 3.87–5.11)
RDW: 13.4 % (ref 11.5–14.6)
Sodium: 139 meq/L (ref 135–145)
TSH: 0.86 microintl units/mL (ref 0.35–5.50)
Total Bilirubin: 0.4 mg/dL (ref 0.3–1.2)
Total CHOL/HDL Ratio: 3
Total Protein: 7 g/dL (ref 6.0–8.3)
Triglycerides: 86 mg/dL (ref 0.0–149.0)
VLDL: 17.2 mg/dL (ref 0.0–40.0)
WBC: 10.3 10*3/uL (ref 4.5–10.5)

## 2009-09-10 ENCOUNTER — Ambulatory Visit: Payer: Self-pay | Admitting: Vascular Surgery

## 2009-11-18 ENCOUNTER — Encounter: Payer: Self-pay | Admitting: Pulmonary Disease

## 2009-11-19 ENCOUNTER — Telehealth: Payer: Self-pay | Admitting: Cardiovascular Disease

## 2009-11-27 ENCOUNTER — Telehealth: Payer: Self-pay | Admitting: Cardiovascular Disease

## 2009-12-08 ENCOUNTER — Ambulatory Visit: Payer: Self-pay | Admitting: Pulmonary Disease

## 2009-12-16 ENCOUNTER — Telehealth: Payer: Self-pay | Admitting: Pulmonary Disease

## 2009-12-26 ENCOUNTER — Encounter: Payer: Self-pay | Admitting: Pulmonary Disease

## 2010-01-02 ENCOUNTER — Ambulatory Visit: Payer: Self-pay | Admitting: Pulmonary Disease

## 2010-01-02 DIAGNOSIS — J9691 Respiratory failure, unspecified with hypoxia: Secondary | ICD-10-CM

## 2010-01-02 DIAGNOSIS — G4710 Hypersomnia, unspecified: Secondary | ICD-10-CM | POA: Insufficient documentation

## 2010-01-02 HISTORY — DX: Hypersomnia, unspecified: G47.10

## 2010-01-10 HISTORY — PX: TOTAL HIP ARTHROPLASTY: SHX124

## 2010-01-13 ENCOUNTER — Telehealth (INDEPENDENT_AMBULATORY_CARE_PROVIDER_SITE_OTHER): Payer: Self-pay | Admitting: *Deleted

## 2010-01-28 ENCOUNTER — Telehealth (INDEPENDENT_AMBULATORY_CARE_PROVIDER_SITE_OTHER): Payer: Self-pay | Admitting: *Deleted

## 2010-02-02 ENCOUNTER — Inpatient Hospital Stay (HOSPITAL_COMMUNITY): Admission: RE | Admit: 2010-02-02 | Discharge: 2010-02-05 | Payer: Self-pay | Admitting: Orthopedic Surgery

## 2010-03-18 ENCOUNTER — Ambulatory Visit: Payer: Self-pay | Admitting: Vascular Surgery

## 2010-03-24 ENCOUNTER — Ambulatory Visit (HOSPITAL_BASED_OUTPATIENT_CLINIC_OR_DEPARTMENT_OTHER): Admission: RE | Admit: 2010-03-24 | Payer: Self-pay | Source: Home / Self Care | Admitting: Pulmonary Disease

## 2010-04-13 HISTORY — PX: JOINT REPLACEMENT: SHX530

## 2010-05-03 ENCOUNTER — Encounter: Payer: Self-pay | Admitting: Vascular Surgery

## 2010-05-12 NOTE — Progress Notes (Signed)
Summary: medical clearance---last OV note faxed   Phone Note Call from Patient Call back at Home Phone [phone]   Caller: Patient Call For: sood Summary of Call: Pt states she needs medical clearance to have hip surgery. Initial call taken by: Darletta Moll,  January 13, 2010 4:56 PM  Follow-up for Phone Call        pt was recently seen by VS on 01/02/2010 and there is documentation in the OV note regarding hip surgery clearance.  Will fax OV note to Dr. Elita Quick at [phone]  attn: Olegario Messier. Called and spoke with pt.  pt is aware OV note faxed.   Aundra Millet Reynolds LPN  January 13, 2010 5:16 PM

## 2010-05-12 NOTE — Assessment & Plan Note (Signed)
Summary: f1y   Visit Type:  Follow-up Primary Provider:  Jimmye Norman  CC:  follow up 1 year.  Pt has been having some dizzy spells but is now taking meclizine for this..  History of Present Illness: This is a 65 year old woman with nonobstructive coronary artery disease, essential hypertension, and peripheral arterial disease. She also has a history of lung cancer. She has complained of atypical leg pain in the past that has been considered unrelated to her vascular disease.  Her cath report from 2006 shows: 1. Normal left ventricular function. 2. A 50% eccentric stenosis of the mid left anterior descending artery. 3. No other high grade coronary lesions.  Today she c/o:  Worsening claudication to her LE which has limited her ability to ambulate freely, she states that this has been getting worse over the past few months and is associated with LE tightness, tingling and pain. She denies edema in her LE.  She has been worked up by Dr. Darrick Penna who ordered a lower extremity CT angiogram. The patient was afraid to have the CT scan done at an imaging center and requests that we do it here where there is a cardiologist on site.  She also requests routine blood work, as her PCP has moved to another city and she has not had a chance to find another PCP.   Patient currently denies SOB, Denies CP, Denies fever, chills, nausea, vomiting, diarrhea, constipation. Otherwise she is doing well and denies any other complaints.       Current Medications (verified): 1)  Klor-Con 20 Meq  Pack (Potassium Chloride) .... Take 1 By Mouth Two Times A Day 2)  Diltiazem Hcl Cr 240 Mg  Cp24 (Diltiazem Hcl) .... One By Mouth Once Daily 3)  Prilosec 20 Mg Cpdr (Omeprazole) .... Take 1 By Mouth Two Times A Day 4)  Gemfibrozil 600 Mg  Tabs (Gemfibrozil) .... One By Mouth Two Times A Day 5)  Celexa 40 Mg Tabs (Citalopram Hydrobromide) .... Take 1 By Mouth Once Daily 6)  Xanax 1 Mg Tabs (Alprazolam) .... Take 1 By  Mouth  Three Times A Day 7)  Cozaar 50 Mg  Tabs (Losartan Potassium) .... Take 1 Tablet By Mouth Two Times A Day 8)  Glucophage 500 Mg  Tabs (Metformin Hcl) .Marland Kitchen.. 1 Tablet Once Daily 9)  Ultram 50 Mg  Tabs (Tramadol Hcl) .... One By Mouth Every 6 Hours  As Needed 10)  Vicodin Es 7.5-750 Mg Tabs (Hydrocodone-Acetaminophen) .... Q6hrs As Needed For Pain 11)  Lidoderm 5 %  Ptch (Lidocaine) .... Apply 1-3 Patches To Skin Once Daily 12)  Claritin 10 Mg  Tabs (Loratadine) .... Take 1 Tablet By Mouth Once A Day 13)  Advair Diskus 250-50 Mcg/dose  Misc (Fluticasone-Salmeterol) .... One Puff Twice Daily-Pt Not Using 14)  Combivent 103-18 Mcg/act Aero (Ipratropium-Albuterol) .... As Needed 15)  Proair Hfa 108 (90 Base) Mcg/act Aers (Albuterol Sulfate) .... 2 Puffs Qid As Needed 16)  Maxzide-25 37.5-25 Mg Tabs (Triamterene-Hctz) .... Take 1 By Mouth Once Daily 17)  Aspirin Ec 325 Mg Tbec (Aspirin) .... Take One Tablet By Mouth Daily 18)  Zyrtec Allergy 10 Mg Caps (Cetirizine Hcl) .Marland Kitchen.. 1 By Mouth Once Daily 19)  Antivert 25 Mg Tabs (Meclizine Hcl) .... 1/2 To 1 Tablet Once Daily  Allergies (verified): 1)  ! * Contrast Dye 2)  Pcn 3)  Codeine  Past History:  Past medical history reviewed for relevance to current acute and chronic problems.  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 HEENT: normal Neck: normal carotid upstrokes without bruits, JVP normal Lungs: CTA CV: RRR without murmur or gallop Abd: soft, NT, positive BS, no bruit, no organomegaly Ext: no clubbing, cyanosis, or edema. Pedal pulses diminished bilaterally. Skin: warm and dry without rash   Impression & Recommendations:  Problem # 1:  PVD (ICD-443.9) Assessment Deteriorated Patient c/o of worsening claudication, and reduced ability to walk for more than a few feet over the past few months. No evidence of critical limb ischemia noted. She has both typical and atypical leg pain and this may be related to nonvascular causes. However, with an abnormal pulse exam it is certainly reasonable to proceed with a CT angiogram. Will do this in our office at the patient's request and forward the information to Dr. Darrick Penna.  plan: elective CT angio of abd AO with b/l LE runoff to evalate further progression of PVD. Continue antiplatelet therapy with aspirin.  Problem # 2:  CORONARY ARTERY DISEASE (ICD-414.00) Assessment: Comment Only Well controlled on current treatment, No new changes made today, Will continue to monitor.   Her updated medication list for this problem includes:    Diltiazem Hcl Cr 240 Mg Cp24 (Diltiazem hcl) ..... One by mouth once daily    Aspirin Ec 325 Mg Tbec (Aspirin) .Marland Kitchen... Take one tablet by mouth daily  Orders: EKG w/ Interpretation (93000)  Problem # 3:  HYPERTENSION (ICD-401.9) Assessment: Comment Only BP remains slightly elevated, however due to patients c/o occational dizziness recently (this is likely 2/2 to patients xanex vs vicodin) we will not make any changes to her meds on this visit and reevalaute BP on next visit.  will check BMET, TSH.  Her updated medication list for this problem  includes:    Diltiazem Hcl Cr 240 Mg Cp24 (Diltiazem hcl) ..... One by mouth once daily    Cozaar 50 Mg Tabs (Losartan potassium) .Marland Kitchen... Take 1 tablet by mouth two times a day    Maxzide-25 37.5-25 Mg Tabs (Triamterene-hctz) .Marland Kitchen... Take 1 by mouth once daily    Aspirin Ec 325 Mg Tbec (Aspirin) .Marland Kitchen... Take one tablet by mouth daily  Orders: TLB-BMP (Basic Metabolic Panel-BMET) (80048-METABOL) TLB-Hepatic/Liver Function Pnl (80076-HEPATIC) TLB-TSH (Thyroid Stimulating Hormone) (84443-TSH)  BP today: 162/64 Prior BP: 124/56 (11/21/2008)  Labs Reviewed: K+: 4.4 (06/30/2006) Creat: : 0.8 (06/30/2006)   Chol: 132 (05/10/2006)   HDL: 47.7 (05/10/2006)   LDL: 73 (05/10/2006)   TG: 58 (05/10/2006)  Problem # 4:  HYPERLIPIDEMIA (ICD-272.4) Assessment: Comment Only Check lipid panel.  Her updated medication list for this problem includes:    Gemfibrozil 600 Mg Tabs (  Gemfibrozil) ..... One by mouth two times a day  Orders: TLB-BMP (Basic Metabolic Panel-BMET) (80048-METABOL) TLB-Hepatic/Liver Function Pnl (80076-HEPATIC) TLB-Lipid Panel (80061-LIPID)  CHOL: 132 (05/10/2006)   LDL: 73 (05/10/2006)   HDL: 47.7 (05/10/2006)   TG: 58 (05/10/2006)  Problem # 5:  DIABETES MELLITUS, TYPE II (ICD-250.00) Assessment: Comment Only  Well controlled on current treatment, No new changes made today, Will continue to monitor and check A1c today.  Managed by primary physician, but she requests full lab work today. Will need to hold Glucophage prior to her CT scan.  Her updated medication list for this problem includes:    Cozaar 50 Mg Tabs (Losartan potassium) .Marland Kitchen... Take 1 tablet by mouth two times a day    Glucophage 500 Mg Tabs (Metformin hcl) .Marland Kitchen... 1 tablet once daily    Aspirin Ec 325 Mg Tbec (Aspirin) .Marland Kitchen... Take one tablet by mouth daily  Orders: TLB-A1C / Hgb A1C (Glycohemoglobin) (83036-A1C)  Other Orders: CT Scan  (CT Scan) TLB-CBC Platelet - w/Differential (85025-CBCD)  Patient  Instructions: 1)  Your physician recommends that you schedule a follow-up appointment in: 12 months 2)  Your physician recommends that you return for lab work [id number]. TSH,A1C,CBC,CMET, LIPID PANEL 3)  Non-Cardiac CT scanning, (CAT scanning), is a noninvasive, special x-ray that produces cross-sectional images of the body using x-rays and a computer. CT scans help physicians diagnose and treat medical conditions. For some CT exams, a contrast material is used to enhance visibility in the area of the body being studied. CT scans provide greater clarity and reveal more details than regular x-ray exams. I called the pt and made her aware that she should not take Metformin the night before, day of, or 48 hours after CTA.  The pt verbalized understanding.  Julieta Gutting, RN, BSN  August 07, 2009 6:46 PM

## 2010-05-12 NOTE — Assessment & Plan Note (Signed)
Summary: rov 2 wks ///kp   Visit Type:  Follow-up Copy to:  Tonny Bollman, Gretta Began, Gean Birchwood Primary Provider/Referring Provider:  Jimmye Norman  CC:  COPD...the patient says there is no change in her breathing...she stopped Spiriva due to chest and back pain and went back on Combivent...she is also using the Advair twice daily.Marland Kitchen  History of Present Illness: 65 yo female with Moderate COPD, Sinusitis  She had to stop using spiriva.  This made her chest hurt.  Her chest pain resolved once she stopped spiriva.  She is now using combivent 4 to 5 times per day, and this helps.  She still is using advair.  Her overnight oximetry showed oxygen desaturation suggestive of sleep apnea.  She has been getting trouble with her sleep.  She has to sleep on two pillows, and wakes up gasping for air.  She does snore.   She still has some sinus congestion, but this has gotten better.  She denies fever.  She still has some cough with white sputum and occasional wheezing.  She denies hemoptysis.    Current Medications (verified): 1)  Klor-Con 20 Meq  Pack (Potassium Chloride) .... Take 1 By Mouth Two Times A Day 2)  Diltiazem Hcl Cr 240 Mg  Cp24 (Diltiazem Hcl) .... One By Mouth Once Daily 3)  Prilosec 20 Mg Cpdr (Omeprazole) .... Take 1 By Mouth Two Times A Day 4)  Gemfibrozil 600 Mg  Tabs (Gemfibrozil) .... One By Mouth Two Times A Day 5)  Celexa 40 Mg Tabs (Citalopram Hydrobromide) .... Take 1 By Mouth Once Daily 6)  Xanax 1 Mg Tabs (Alprazolam) .... Take 1 By Mouth  Three Times A Day 7)  Cozaar 50 Mg  Tabs (Losartan Potassium) .... Take 1 Tablet By Mouth Two Times A Day 8)  Glucophage 500 Mg  Tabs (Metformin Hcl) .Marland Kitchen.. 1 Tablet Once Daily 9)  Ultram 50 Mg  Tabs (Tramadol Hcl) .... One By Mouth Every 6 Hours  As Needed 10)  Vicodin Es 7.5-750 Mg Tabs (Hydrocodone-Acetaminophen) .... Q6hrs As Needed For Pain 11)  Lidoderm 5 %  Ptch (Lidocaine) .... Apply 1-3 Patches To Skin Once Daily 12)  Claritin  10 Mg  Tabs (Loratadine) .... Take 1 Tablet By Mouth Once A Day 13)  Advair Diskus 250-50 Mcg/dose  Misc (Fluticasone-Salmeterol) .... One Puff Twice Daily-Pt Not Using 14)  Proair Hfa 108 (90 Base) Mcg/act Aers (Albuterol Sulfate) .... 2 Puffs Qid As Needed 15)  Maxzide-25 37.5-25 Mg Tabs (Triamterene-Hctz) .... Take 1 By Mouth Once Daily 16)  Aspirin Ec 325 Mg Tbec (Aspirin) .... Take One Tablet By Mouth Daily 17)  Antivert 25 Mg Tabs (Meclizine Hcl) .... 1/2 To 1 Tablet Once Daily 18)  Nasonex 50 Mcg/act Susp (Mometasone Furoate) .... Two Sprays Once Daily 19)  Combivent 18-103 Mcg/act Aero (Ipratropium-Albuterol) .... 2 Puffs Four To Five Times Daily  Allergies (verified): 1)  ! * Contrast Dye 2)  Pcn 3)  Codeine  Past History:  Past Medical History: Reviewed history from 12/08/2009 and no changes required. 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  Abdomina U/S - no cholystectomy - 04/10/2001, BTL -, Cardiac echo 11/02- grossly normal EF - 03/13/2001, Cath 3/01 50% stenosis mid LAD, NL EF - 06/19/2000, Cervical cryo -, CT scan chest neg for PE 11/02 - 03/13/2001, Dobut echo 3/01 susp for ant wall isch - 06/19/2000, LE Doppler negative bilaterally 11/02 - 03/13/2001, lobectomy (left lung) adenocarcinoma - 05/13/1986, septoplasty -, Urologic W/U scheduled - 09/03/1999  Family History: Reviewed history from 12/08/2009 and no changes required. Father - bone cancer Mother - diabetes  Social  History: Reviewed history from 12/08/2009 and no changes required. Quit Smoking 11/00 The patient is divorced, has 3 children.  She is a disabled.  Worked previously as a Interior and spatial designer. Alcohol Use - no  Vital Signs:  Patient profile:   65 year old female Height:      66 inches (167.64 cm) Weight:      188.25 pounds (85.57 kg) BMI:     30.49 O2 Sat:      95 % on Room air Temp:     98.1 degrees F (36.72 degrees C) oral Pulse rate:   93 / minute BP sitting:   146 / 66  (left arm) Cuff size:   regular  Vitals Entered By: Michel Bickers CMA (January 02, 2010 1:53 PM)  O2 Sat at Rest %:  95 O2 Flow:  Room air CC: COPD...the patient says there is no change in her breathing...she stopped Spiriva due to chest and back pain and went back on Combivent...she is also using the Advair twice daily. Comments Medications reviewed with patient Daytime phone verified. Michel Bickers [facility]  January 02, 2010 2:00 PM   Physical Exam  General:  obese.   Nose:  clear nasal discharge.   Mouth:  no exudate Neck:  no JVD.   Lungs:  diminished breath sounds, prolonged exhalation, no dullness, no wheezing or rales Heart:  regular rhythm, normal rate, and no murmurs.   Abdomen:  soft, non-tender, normal bowel sounds Extremities:  no clubbing, cyanosis, edema, or deformity noted Neurologic:  normal CN II-XII and strength normal.   Cervical Nodes:  no significant adenopathy Psych:  anxious.     Impression & Recommendations:  Problem # 1:  COPD (ICD-496) She is to continue advair and as needed combivent.  Will give her a flu shot today.  She would benefit from pulmonary rehab once she has recovered from hip surgery.  Problem # 2:  BRONCHITIS, ACUTE (ICD-466.0) She has recovered from this after recent antibiotic treatment.  Problem # 3:  HYPERSOMNIA (ICD-780.54) She has sleep disruption, and daytime sleepiness.  She also has cardiovascular disease.  Her recent overnight oximetry showed oxygen  desaturation in a pattern suggestive of sleep apnea.  To further assess this I will schedule her for a split night sleep study.  Problem # 4:  PRE-OPERATIVE RESPIRATORY EXAMINATION (ICD-V72.82) I have explained to her that she is at increased, but not prohibitive, risk for peri-operative pulmonary complications related to her hip surgery.  I would recommend that she continue on her bronchodilator therapy during the post-operative period.  There is concern for possible sleep disordered breathing, and as such she should have close monitoring of her oxygenation in the post-operative period.  Depending on her status she may benefit from empiric CPAP therapy during the post-operative period.  Pulmonary service can be available as needed in the post-operative period.  Medications Added to Medication List This Visit: 1)  Combivent 18-103 Mcg/act Aero (Ipratropium-albuterol) .... 2 puffs four to five  times daily  Complete Medication List: 1)  Klor-con 20 Meq Pack (Potassium chloride) .... Take 1 by mouth two times a day 2)  Diltiazem Hcl Cr 240 Mg Cp24 (Diltiazem hcl) .... One by mouth once daily 3)  Prilosec 20 Mg Cpdr (Omeprazole) .... Take 1 by mouth two times a day 4)  Gemfibrozil 600 Mg Tabs (Gemfibrozil) .... One by mouth two times a day 5)  Celexa 40 Mg Tabs (Citalopram hydrobromide) .... Take 1 by mouth once daily 6)  Xanax 1 Mg Tabs (Alprazolam) .... Take 1 by mouth  three times a day 7)  Cozaar 50 Mg Tabs (Losartan potassium) .... Take 1 tablet by mouth two times a day 8)  Glucophage 500 Mg Tabs (Metformin hcl) .Marland Kitchen.. 1 tablet once daily 9)  Ultram 50 Mg Tabs (Tramadol hcl) .... One by mouth every 6 hours  as needed 10)  Vicodin Es 7.5-750 Mg Tabs (Hydrocodone-acetaminophen) .... Q6hrs as needed for pain 11)  Lidoderm 5 % Ptch (Lidocaine) .... Apply 1-3 patches to skin once daily 12)  Claritin 10 Mg Tabs (Loratadine) .... Take 1 tablet by mouth once a day 13)  Advair Diskus 250-50 Mcg/dose  Misc (Fluticasone-salmeterol) .... One puff twice daily-pt not using 14)  Proair Hfa 108 (90 Base) Mcg/act Aers (Albuterol sulfate) .... 2 puffs qid as needed 15)  Maxzide-25 37.5-25 Mg Tabs (Triamterene-hctz) .... Take 1 by mouth once daily 16)  Aspirin Ec 325 Mg Tbec (Aspirin) .... Take one tablet by mouth daily 17)  Antivert 25 Mg Tabs (Meclizine hcl) .... 1/2 to 1 tablet once daily 18)  Nasonex 50 Mcg/act Susp (Mometasone furoate) .... Two sprays once daily 19)  Combivent 18-103 Mcg/act Aero (Ipratropium-albuterol) .... 2 puffs four to five times daily  Other Orders: Est. Patient Level IV (81191) Flu Vaccine 87yrs + MEDICARE PATIENTS (Y7829) Administration Flu vaccine - MCR (F6213) Sleep Disorder Referral (Sleep Disorder)  Patient Instructions: 1)  Will schedule sleep test, and will call with results 2)  Flu shot today 3)  Follow up in 3 to 4 months   Flu Vaccine Consent Questions     Do you have a history of severe allergic reactions to this vaccine? no    Any prior history of allergic reactions to egg and/or gelatin? no    Do you have a sensitivity to the preservative Thimersol? no    Do you have a past history of Guillan-Barre Syndrome? no    Do you currently have an acute febrile illness? no    Have you ever had a severe reaction to latex? no    Vaccine information given and explained to patient? yes    Are you currently pregnant? no    Lot Number:[id number]   Exp Date:10/10/2010   Site Given  Left Deltoid IMflu  Arman Filter LPN  January 02, 2010 2:39 PM

## 2010-05-12 NOTE — Progress Notes (Signed)
  Stress,Clearance faxed to Jenny/SS @ [phone] [facility]  January 28, 2010 3:56 PM

## 2010-05-12 NOTE — Progress Notes (Signed)
Summary: hip surgery  Phone Note From Other Clinic   Caller: Olegario Messier Summary of Call: Per Olegario Messier ofc [phone] pt needs clearance for Hip surgery  Initial call taken by: Edman Circle,  November 27, 2009 4:43 PM  Follow-up for Phone Call        I spoke with Olegario Messier and this pt needs a hip replacement with Dr Turner Daniels.  This surgery has not been scheduled.  Please advise if the pt can hold ASA, this is not required per Olegario Messier.  Pt needs cardiac clearance.  Will send message to Dr Excell Seltzer to see if he needs to see this pt prior to clearance or if the pt is cleared.  Julieta Gutting, RN, BSN  November 28, 2009 10:29 AM  Additional Follow-up for Phone Call Additional follow up Details #1::        Pt has only mild CAD without prior ischemic event. She can proceed with surgery off of ASA if necessary. No CV testing is required before surgery. Additional Follow-up by: Norva Karvonen, MD,  November 28, 2009 1:56 PM     Appended Document: hip surgery Note faxed to [phone].  AttnOlegario Messier

## 2010-05-12 NOTE — Progress Notes (Signed)
Summary: med question  Phone Note Call from Patient Call back at Home Phone [phone]   Caller: Patient Reason for Call: Talk to Nurse Summary of Call: request to speak to nurse about meds Initial call taken by: Migdalia Dk,  August 08, 2009 1:32 PM  Follow-up for Phone Call        pt asking about taking Aspirin and cardiac medications prior to CT--Pt knows to hold  Metformin starting Sunday night 08-10-09-discussed with pt--OK  to take cardiac medications and Aspirin prior to CT scan with small amount of water-I talked with patient

## 2010-05-12 NOTE — Letter (Signed)
Summary: [facility]  [facility]   Imported By: Lennie Odor 01/05/2010 11:27:45  _____________________________________________________________________  External Attachment:    Type:   Image     Comment:   External Document

## 2010-05-12 NOTE — Procedures (Signed)
Summary: Order for Pulse Oximetry / IDTF  Order for Pulse Oximetry / IDTF   Imported By: Lennie Odor 01/08/2010 15:02:21  _____________________________________________________________________  External Attachment:    Type:   Image     Comment:   External Document

## 2010-05-12 NOTE — Progress Notes (Signed)
Summary: Question aboput a procedure  Phone Note Call from Patient Call back at Home Phone [phone]   Caller: Patient Summary of Call: Pt want to talk to Dr.Celeste Tavenner about a procedure . Initial call taken by: Judie Grieve,  July 18, 2009 3:56 PM  Follow-up for Phone Call        Spoke with pt regarding Dr. Excell Seltzer to clear her for CT angiogram.This procedure  was order by the vein doctor. Pt. states every time dye was used on procedures she feels a burning sensation. I let pt. know she does not need clearance for CT angiogram. She needs to let the MD know the reaction she has had with the dye. The MD doing the angiogram may give her Pre-med. Pt. verbalized understanding. Follow-up by: Ollen Gross, RN, BSN,  July 18, 2009 4:22 PM

## 2010-05-12 NOTE — Progress Notes (Signed)
Summary: rx sub/ set up ONO  Phone Note Call from Patient Call back at Home Phone [phone]   Caller: Patient Call For: sood Summary of Call: pt was seen 8/29. the same evening she used spiriva for the 1st time. she states that she used it for the next 5 days. stopped staking it sat night- 9/3. later that day she used her combivent which she has continued to use. she states that the symbicort made her sick on her stomach and caused her back and chest to hurt (like a heart attach). she wants to know what dr Craige Cotta would like to sub for this. ALSO pt requests to have ONO set up since she feels as if she is smothering in her sleep.  Initial call taken by: Tivis Ringer, CNA,  December 16, 2009 3:38 PM  Follow-up for Phone Call        Called and spoke with pt.  She states that she had to d/c spiriva due to having CP after each use.  She states that she tried to use for 5 days in a row and had CP each time, also had "tore up stomach", arm pain and back pain.  She states that she started back on combivent and this "helps more that anything".  Also pt states that hometown o2 has yet to come out and do ONO- order was faaxed to [facility]m on 12/08/09.  Will forward msg to both [facility] and [facility].  Follow-up by: Vernie Murders,  December 16, 2009 3:50 PM

## 2010-05-12 NOTE — Procedures (Signed)
Summary: Oximetry/IDS  Oximetry/IDS   Imported By: Lester Rochelle 01/16/2010 07:47:01  _____________________________________________________________________  External Attachment:    Type:   Image     Comment:   External Document

## 2010-05-12 NOTE — Assessment & Plan Note (Signed)
Summary: f/u asthma///kp   Copy to:  Tonny Bollman, Gretta Began, Gean Birchwood Primary Provider/Referring Provider:  Jimmye Norman  CC:  COPD.  The patient c/o increased SOB and  asthma "flares". SOB is worse with ecertion and at rest. She is needing pulmonary clearnace for hip surgery. she is using both the combivent and proair several times a day. Surgical clearance to be faxed to [facility] @ [facility] and fax# [phone].Marland Kitchen  History of Present Illness: 65 yo female with COPD.  She is here for pulmonary evaluation prior to having left hip replacement.  This will be done by Dr. Turner Daniels.  She has been getting more trouble with her breathing.  She has been congested, but does not cough up much sputum.  When she does bring up sputum it is usually white to yellow.  She denies hemoptysis.  She has been getting winded with minimal exertion.  She has some wheeze, and chest tightness.  She has been getting intermittent fever up to 102F.  She gets a gassy feeling in her chest which improves after using her inhalers.  She has been getting more trouble with her allergies.  She has not been getting leg swelling or gland swelling.  She has not noticed any rashes.  She is using her combivent 4 or 5 times per day, and proair two times a day.    She started smoking at age 38, smoked 1 pack per day, and quit in 2000.  She has not had a chest xray recently.   CXR  Procedure date:  12/08/2009  Findings:      CHEST - 2 VIEW   Comparison: Plain films of the chest 08/25/2007 and CT chest 05/09/2003.   Findings: The lungs are emphysematous but clear.  Surgical clips and suture material left hilum noted.  No focal airspace disease or effusion.  Heart size normal.  No pneumothorax.   IMPRESSION:   1.  No acute finding. 2.  Emphysema.   Current Medications (verified): 1)  Klor-Con 20 Meq  Pack (Potassium Chloride) .... Take 1 By Mouth Two Times A Day 2)  Diltiazem Hcl Cr 240 Mg  Cp24 (Diltiazem Hcl) ....  One By Mouth Once Daily 3)  Prilosec 20 Mg Cpdr (Omeprazole) .... Take 1 By Mouth Two Times A Day 4)  Gemfibrozil 600 Mg  Tabs (Gemfibrozil) .... One By Mouth Two Times A Day 5)  Celexa 40 Mg Tabs (Citalopram Hydrobromide) .... Take 1 By Mouth Once Daily 6)  Xanax 1 Mg Tabs (Alprazolam) .... Take 1 By Mouth  Three Times A Day 7)  Cozaar 50 Mg  Tabs (Losartan Potassium) .... Take 1 Tablet By Mouth Two Times A Day 8)  Glucophage 500 Mg  Tabs (Metformin Hcl) .Marland Kitchen.. 1 Tablet Once Daily 9)  Ultram 50 Mg  Tabs (Tramadol Hcl) .... One By Mouth Every 6 Hours  As Needed 10)  Vicodin Es 7.5-750 Mg Tabs (Hydrocodone-Acetaminophen) .... Q6hrs As Needed For Pain 11)  Lidoderm 5 %  Ptch (Lidocaine) .... Apply 1-3 Patches To Skin Once Daily 12)  Claritin 10 Mg  Tabs (Loratadine) .... Take 1 Tablet By Mouth Once A Day 13)  Advair Diskus 250-50 Mcg/dose  Misc (Fluticasone-Salmeterol) .... One Puff Twice Daily-Pt Not Using 14)  Combivent 103-18 Mcg/act Aero (Ipratropium-Albuterol) .... As Needed 15)  Proair Hfa 108 (90 Base) Mcg/act Aers (Albuterol Sulfate) .... 2 Puffs Qid As Needed 16)  Maxzide-25 37.5-25 Mg Tabs (Triamterene-Hctz) .... Take 1 By Mouth Once Daily 17)  Aspirin Ec 325 Mg Tbec (Aspirin) .... Take One Tablet By Mouth Daily 18)  Antivert 25 Mg Tabs (Meclizine Hcl) .... 1/2 To 1 Tablet Once Daily 19)  Mobic 15 Mg Tabs (Meloxicam) .Marland Kitchen.. 1 By Mouth Daily  Allergies (verified): 1)  ! 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  She is a disabled.  Worked previously as a Interior and spatial designer. Alcohol Use - no  Review of Systems       The patient complains of shortness of breath with activity, shortness of breath at rest, productive cough, non-productive cough, chest pain, acid heartburn, indigestion, sore throat, headaches, nasal congestion/difficulty breathing through nose, anxiety, change in color of mucus, and fever.  The patient denies coughing up blood, irregular heartbeats, loss of appetite, weight change, abdominal pain, difficulty swallowing, sneezing, itching, ear ache, depression, hand/feet swelling, joint stiffness or pain, and rash.    Vital Signs:  Patient profile:   65 year old female Height:      66 inches (167.64 cm) Weight:      188 pounds (85.45 kg) BMI:     30.45 O2 Sat:      90 % on Room air Temp:     97.8 degrees F (36.56 degrees C) oral Pulse rate:   103 / minute BP sitting:   162 / 80  (right arm) Cuff size:   regular  Vitals Entered By: Michel Bickers CMA (December 08, 2009 11:34 AM)  O2 Sat at Rest %:  90 O2 Flow:  Room air  Serial Vital Signs/Assessments:  Comments: 12:15 PM Ambulatory Pulse Oximetry  Resting; HR__74___    02 Sat___97% on room air__  Lap1 (185 feet)   HR__104___   02 Sat__95% on room air___ Lap2 (185 feet)   HR_____   02 Sat_____    Lap3 (185 feet)   HR_____   02 Sat_____  ___Test Completed without Difficulty __x_Test Stopped due to: patient complained of fatigue and weakness and being light-headed after 1 lap.  By: Michel Bickers CMA   CC: COPD.  The patient c/o increased SOB and  asthma "flares". SOB is worse with ecertion and at rest. She is needing pulmonary clearnace for hip surgery. she is using both the combivent and proair several times a day. Surgical clearance to be faxed to [facility] @ [facility], fax#  [phone]. Comments Medications reviewed with the patient. Daytime phone verified. Michel Bickers [facility]  December 08, 2009 11:35 AM   Physical Exam  General:  obese.   Eyes:  PERRLA and EOMI.   Nose:  clear nasal discharge.   Mouth:  no exudate Neck:  no JVD.   Chest Wall:  barrel chest.   Lungs:  diminished breath sounds, prolonged exhalation, no dullness, no wheezing or rales Heart:  regular rhythm, normal rate, and no murmurs.   Abdomen:  soft, non-tender, normal bowel sounds Pulses:  pulses normal Extremities:  no clubbing, cyanosis, edema, or deformity noted Neurologic:  normal CN II-XII and strength normal.   Cervical Nodes:  no significant adenopathy Psych:  anxious.     Pulmonary Function Test Date: 12/08/2009 12:04 AM Gender: Female  Pre-Spirometry FVC    Value: 2.73 L/min   % Pred: 80.80 % FEV1    Value: 1.75 L     Pred: 2.59 L     % Pred: 67.60 % FEV1/FVC  Value: 64.09 %     % Pred: 83.20 %  Comments: Moderate obstruction  Impression & Recommendations:  Problem # 1:  BRONCHITIS, ACUTE (ICD-466.0) She has an acute bronchitis.  Will give her a course of zithromax.  Problem # 2:  COPD (ICD-496) Will adjust her inhaler regimen.  She is to continue advair and as needed proair.  I will have her stop combivent and add spiriva.  I don't think that she needs oral steroids at this time.  She has borderline oxygen saturation.  Will arrange for overnight oximetry to further evaluate.  Problem # 3:  SINUSITIS, CHRONIC NOS (ICD-473.9) Advised her to use nasal irrigation and nasonex on a regular basis.  Problem # 4:  PRE-OPERATIVE RESPIRATORY EXAMINATION (ICD-V72.82) She is being evaluated for left hip arthroplasty.  I have recommended that she recover from her acute bronchitis, and get on a more stable COPD inhaler regimen first prior to proceding with surgery.  I will follow up with her in two weeks, and re-assess her pulmonary fitness for having surgery.  Medications Added  to Medication List This Visit: 1)  Mobic 15 Mg Tabs (Meloxicam) .Marland Kitchen.. 1 by mouth daily 2)  Spiriva Handihaler 18 Mcg Caps (Tiotropium bromide monohydrate) .... One puff once daily 3)  Nasonex 50 Mcg/act Susp (Mometasone furoate) .... Two sprays once daily 4)  Zithromax Z-pak 250 Mg Tabs (Azithromycin) .... Use as directed  Complete Medication List: 1)  Klor-con 20 Meq Pack (Potassium chloride) .... Take 1 by mouth two times a day 2)  Diltiazem Hcl Cr 240 Mg Cp24 (Diltiazem hcl) .... One by mouth once daily 3)  Prilosec 20 Mg Cpdr (Omeprazole) .... Take 1 by mouth two times a day 4)  Gemfibrozil 600 Mg Tabs (Gemfibrozil) .... One by mouth two times a day 5)  Celexa 40 Mg Tabs (Citalopram hydrobromide) .... Take 1 by mouth once daily 6)  Xanax 1 Mg Tabs (Alprazolam) .... Take 1 by mouth  three times a day 7)  Cozaar 50 Mg Tabs (Losartan potassium) .... Take 1 tablet by mouth two times a day 8)  Glucophage 500 Mg Tabs (Metformin hcl) .Marland Kitchen.. 1 tablet once daily 9)  Ultram 50 Mg Tabs (Tramadol hcl) .... One by mouth every 6 hours  as needed 10)  Vicodin Es 7.5-750 Mg Tabs (Hydrocodone-acetaminophen) .... Q6hrs as needed for pain 11)  Lidoderm 5 % Ptch (Lidocaine) .... Apply 1-3 patches to skin once daily 12)  Claritin 10 Mg Tabs (Loratadine) .... Take 1 tablet by mouth once a day 13)  Advair Diskus 250-50 Mcg/dose Misc (Fluticasone-salmeterol) .... One puff twice daily-pt not using 14)  Proair Hfa 108 (90 Base) Mcg/act Aers (Albuterol sulfate) .... 2 puffs qid as needed 15)  Maxzide-25 37.5-25 Mg Tabs (Triamterene-hctz) .... Take 1 by mouth once daily 16)  Aspirin Ec 325 Mg Tbec (Aspirin) .... Take one tablet by mouth daily 17)  Antivert 25 Mg Tabs (Meclizine hcl) .... 1/2 to 1 tablet once daily 18)  Mobic 15 Mg Tabs (Meloxicam) .Marland Kitchen.. 1 by mouth daily 19)  Spiriva Handihaler 18 Mcg Caps (  Tiotropium bromide monohydrate) .... One puff once daily 20)  Nasonex 50 Mcg/act Susp (Mometasone furoate) ....  Two sprays once daily 21)  Zithromax Z-pak 250 Mg Tabs (Azithromycin) .... Use as directed  Other Orders: Consultation Level V (99245) Spirometry w/Graph (94010) DME Referral (DME) T-2 View CXR (71020TC)  Patient Instructions: 1)  Stop combivent 2)  Spiriva one puff once daily 3)  Advair one puff two times a day, and rinse your mouth after using 4)  Proair two puffs up to four times per day as needed 5)  Nasal irrigation (saline sinus rinse) once daily 6)  Nasonex two sprays once daily  7)  Zithromax (antibiotic): 2 pills on first day, then 1 pill per dayfor 4 days 8)  Follow up in 2 weeks Prescriptions: ZITHROMAX Z-PAK 250 MG TABS (AZITHROMYCIN) use as directed  #1 x 0   Entered and Authorized by:   Coralyn Helling MD   Signed by:   Coralyn Helling MD on 12/08/2009   Method used:   Print then Give to Patient   RxID:   [id number] NASONEX 50 MCG/ACT SUSP (MOMETASONE FUROATE) two sprays once daily  #1 x 6   Entered and Authorized by:   Coralyn Helling MD   Signed by:   Coralyn Helling MD on 12/08/2009   Method used:   Print then Give to Patient   RxID:   [id number] SPIRIVA HANDIHALER 18 MCG CAPS (TIOTROPIUM BROMIDE MONOHYDRATE) one puff once daily  #30 x 6   Entered and Authorized by:   Coralyn Helling MD   Signed by:   Coralyn Helling MD on 12/08/2009   Method used:   Print then Give to Patient   RxID:   [id number]    CardioPerfect Spirometry  ID: [id number] Patient: [name] DOB: 06-24-1945 Age: 65 Years Old Sex: Female Race: White Physician: DThomos Lemons DO Height: 66 Weight: 188 Status: Unconfirmed 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

## 2010-05-12 NOTE — Progress Notes (Signed)
Summary: question re blocked aorta -surgical clearence  Phone Note Call from Patient   Caller: Patient Reason for Call: Talk to Nurse Summary of Call: pt to have hip surgery-wants to talk to dr cooper about her blocked aorta's-pls call [phone] Initial call taken by: Glynda Jaeger,  November 19, 2009 2:22 PM  Follow-up for Phone Call        The pt was wondering why she is not taking a medication like Plavix for her blockages. The pt is contemplating having hip surgery.  I made the pt aware that she would need cardiac clearance for hip surgery if she decides to proceed.  The pt would like Dr Excell Seltzer to prescribe Plavix is he feels this would benefit her medically. Follow-up by: Julieta Gutting, RN, BSN,  November 19, 2009 2:44 PM  Additional Follow-up for Phone Call Additional follow up Details #1::        She's not on plavix because she has no indication for the drug. She has nonocclusive coronary and peripheral disease. Additional Follow-up by: Norva Karvonen, MD,  November 19, 2009 5:59 PM     Appended Document: question re blocked aorta -surgical clearence Pt aware of Dr Earmon Phoenix comments.  The pt said she is probably going to proceed with hip surgery and our office should be getting a request for clearance.

## 2010-06-24 LAB — CBC
HCT: 30.8 % — ABNORMAL LOW (ref 36.0–46.0)
HCT: 35.7 % — ABNORMAL LOW (ref 36.0–46.0)
Hemoglobin: 11.3 g/dL — ABNORMAL LOW (ref 12.0–15.0)
Hemoglobin: 9.6 g/dL — ABNORMAL LOW (ref 12.0–15.0)
MCH: 25.9 pg — ABNORMAL LOW (ref 26.0–34.0)
MCHC: 31.4 g/dL (ref 30.0–36.0)
MCHC: 31.7 g/dL (ref 30.0–36.0)
MCV: 81.9 fL (ref 78.0–100.0)
Platelets: 282 10*3/uL (ref 150–400)
Platelets: 305 10*3/uL (ref 150–400)
Platelets: 327 10*3/uL (ref 150–400)
Platelets: 396 10*3/uL (ref 150–400)
RBC: 3.73 MIL/uL — ABNORMAL LOW (ref 3.87–5.11)
RBC: 4.36 MIL/uL (ref 3.87–5.11)
RDW: 13.3 % (ref 11.5–15.5)
RDW: 13.9 % (ref 11.5–15.5)
RDW: 14.7 % (ref 11.5–15.5)
WBC: 12.3 10*3/uL — ABNORMAL HIGH (ref 4.0–10.5)
WBC: 12.7 10*3/uL — ABNORMAL HIGH (ref 4.0–10.5)
WBC: 13.1 10*3/uL — ABNORMAL HIGH (ref 4.0–10.5)

## 2010-06-24 LAB — BASIC METABOLIC PANEL
BUN: 8 mg/dL (ref 6–23)
BUN: 8 mg/dL (ref 6–23)
CO2: 29 mEq/L (ref 19–32)
Calcium: 10.1 mg/dL (ref 8.4–10.5)
Chloride: 97 mEq/L (ref 96–112)
Creatinine, Ser: 0.79 mg/dL (ref 0.4–1.2)
Creatinine, Ser: 0.79 mg/dL (ref 0.4–1.2)
GFR calc Af Amer: >60 mL/min (ref >60)
GFR calc non Af Amer: >60 mL/min (ref >60)
GFR calc non Af Amer: >60 mL/min (ref >60)
Glucose, Bld: 120 mg/dL — ABNORMAL HIGH (ref 70–99)
Potassium: 4.2 mEq/L (ref 3.5–5.1)
Sodium: 138 mEq/L (ref 135–145)

## 2010-06-24 LAB — GLUCOSE, CAPILLARY
Glucose-Capillary: 118 mg/dL — ABNORMAL HIGH (ref 70–99)
Glucose-Capillary: 123 mg/dL — ABNORMAL HIGH (ref 70–99)
Glucose-Capillary: 136 mg/dL — ABNORMAL HIGH (ref 70–99)
Glucose-Capillary: 137 mg/dL — ABNORMAL HIGH (ref 70–99)
Glucose-Capillary: 145 mg/dL — ABNORMAL HIGH (ref 70–99)
Glucose-Capillary: 159 mg/dL — ABNORMAL HIGH (ref 70–99)
Glucose-Capillary: 160 mg/dL — ABNORMAL HIGH (ref 70–99)

## 2010-06-24 LAB — TYPE AND SCREEN

## 2010-06-24 LAB — URINALYSIS, ROUTINE W REFLEX MICROSCOPIC
Bilirubin Urine: NEGATIVE
Nitrite: NEGATIVE
Specific Gravity, Urine: 1.009 (ref 1.005–1.030)
pH: 6 (ref 5.0–8.0)

## 2010-06-24 LAB — PROTIME-INR
INR: 1.01 (ref 0.00–1.49)
INR: 1.17 (ref 0.00–1.49)
INR: 1.43 (ref 0.00–1.49)
INR: 1.54 — ABNORMAL HIGH (ref 0.00–1.49)
Prothrombin Time: 13.5 seconds (ref 11.6–15.2)
Prothrombin Time: 17.6 seconds — ABNORMAL HIGH (ref 11.6–15.2)
Prothrombin Time: 18.7 seconds — ABNORMAL HIGH (ref 11.6–15.2)

## 2010-06-24 LAB — SURGICAL PCR SCREEN
MRSA, PCR: NEGATIVE
Staphylococcus aureus: NEGATIVE

## 2010-06-24 LAB — DIFFERENTIAL
Basophils Absolute: 0.1 10*3/uL (ref 0.0–0.1)
Basophils Relative: 0 % (ref 0–1)
Eosinophils Absolute: 0.3 10*3/uL (ref 0.0–0.7)
Eosinophils Relative: 2 % (ref 0–5)
Lymphocytes Relative: 31 % (ref 12–46)
Lymphs Abs: 3.9 10*3/uL (ref 0.7–4.0)
Monocytes Absolute: 0.8 10*3/uL (ref 0.1–1.0)
Monocytes Relative: 7 % (ref 3–12)
Neutro Abs: 7.7 10*3/uL (ref 1.7–7.7)
Neutrophils Relative %: 60 % (ref 43–77)

## 2010-06-24 LAB — URINE MICROSCOPIC-ADD ON

## 2010-06-24 LAB — APTT: aPTT: 31 seconds (ref 24–37)

## 2010-08-25 ENCOUNTER — Telehealth: Payer: Self-pay | Admitting: Pulmonary Disease

## 2010-08-25 NOTE — Assessment & Plan Note (Signed)
[facility]                            CARDIOLOGY OFFICE NOTE   NAME:[name]                      MRN:          [id number]  DATE:04/03/2007                            DOB:          1945-07-11    [name] was seen in followup at the [facility] Cardiology office on  April 03, 2007.  [name] is a 65 year old woman with nonobstructive  CAD, hypertension, peripheral arterial disease, and history of lung  cancer.  She presents today for followup.  She has been previously  followed by Dr. Samule Ohm, and also saw Jacolyn Reedy last month.  At her  visit last month she had complained of ongoing palpitations.  This has  been a long-standing problem but has been worse recently.  She describes  palpitations at rest without any precipitating factors.  She has been on  Diltiazem for 5 to 6 years.  An event recorder was ordered and I have  reviewed that study.  It demonstrated PVCs with rare couplets and  ventricular triplets, but there were no sustained arrhythmias.  She also  underwent a Myoview stress study on November 11 that was normal.   [name] complains of occasions of back pain when lying in bed.  She is  concerned that this is coming from her heart.  She has no exertional  symptoms.  She denies chest pain, orthopnea, PND or edema.  She has no  other complaints at this point except as noted above.   CURRENT MEDICATIONS:  1. Klor-Con 40 mEq daily.  2. Meloxicam 15 mg daily.  3. Combivent 2 puffs daily.  4. Os-Cal plus D 500 mg daily.  5. Diltiazem CD 240 mg daily.  6. Prilosec 20 mg twice daily.  7. Gemfibrozil 600 mg twice daily.  8. Triamterine/hydrochlorothiazide 37.5/25 mg daily.  9. Aspirin 325 mg daily.  10.Advair twice daily.  11.Celexa 20 mg daily.  12.Xanax as needed.  13.Lasix 20 mg daily.  14.Cozaar 50 mg daily.  15.Glucophage 250 mg twice daily.   ALLERGIES:  PENICILLIN and CODEINE.   PHYSICAL EXAMINATION:  On exam, the  patient is alert and oriented.  She  is an anxious woman in no acute distress.  Weight is 193, blood pressure is 130/60, heart rate 80, respiratory rate  16.  HEENT:  Normal neck, normal carotid upstrokes with a right carotid  bruit.  LUNGS:  Clear bilaterally.  CARDIAC:  The apex is discrete and nondisplaced, the heart is regular  rate and rhythm without murmurs or gallops.  ABDOMEN:  Soft, nontender, no organomegaly.  EXTREMITIES:  No cyanosis, clubbing, or edema.  Peripheral pulses are 2+  on the right dorsalis pedis and posterior tibial and are faint on the  left.  Femoral pulses are 2+ and equal bilaterally.   ASSESSMENT:  1. Palpitations:  I suspect she is having symptomatic premature      ventricular contractions.  She has significant reactive airway      disease and likely will not tolerate beta blockade.  I have elected      to increase her Cardizem dose from  240 to 360 mg daily.  No other      changes were made to her medical regimen today.  2. Lower extremity peripheral artery disease:  She has no typical      symptoms of claudication.  Continue treatment of cardiovascular      risk factors.  No indication for revascularization at present.  3. Dyslipidemia:  Lipids drawn in January showed a total cholesterol      of 132, triglycerides 58, HDL 48, LDL 73.  Continue current medical      therapy with gemfibrozil.  4. Hypertension:  Blood pressure is well controlled on current regimen      that includes Cozaar and Diltiazem.  5. For followup I would like to see Ms. Adney back in 6 months, or      sooner if any new problems arise.     [name]. Excell Seltzer, MD  Electronically Signed    MDC/MedQ  DD: 04/03/2007  DT: 04/04/2007  Job #: [id number]   cc:   Bertram Millard. Hyacinth Meeker, M.D.

## 2010-08-25 NOTE — Assessment & Plan Note (Signed)
OFFICE VISIT   [name], [name]  DOB:  10/18/1945                                       10/11/2007  ZOXWR#:[id number]   The patient is a 65 year old female who has a 4 year history of pain in  her thighs and in her legs, her inner thighs and legs.  The right leg is  worse than the left.  She states that the burning in her legs is severe  enough that she has to keep her legs separated at night so that the skin  is not touching.  She also cannot allow her grandchild to sit on her  legs.  She denies any claudication symptoms.  She does complain of some  leg swelling but this is fairly mild overall.  Her atherosclerotic risk  factors include diabetes, hypertension and tobacco abuse.  She quit  smoking 14 years ago.  At that time she is primarily complaining of left  leg discomfort.  Her pain at that time was primarily in the left lateral  calf and knee.  At that time her ABIs were greater than 1 on the left  and greater than 1 on the right.  She apparently had some sort of  arterial study done at [facility] a few months ago which she said was also  normal.  She denies any prior history of DVT.  She denies any history of  varicose veins.  She denies rest pain in her feet.   PAST MEDICAL HISTORY:  Is otherwise significant for COPD.  She has  chronic back pain and has had several injections for this.   PAST SURGICAL HISTORY:  Remarkable for a left lobectomy 20 years ago for  cancer.   FAMILY HISTORY:  Unremarkable.   SOCIAL HISTORY:  She is single, has three children, is disabled.  Smoking history is as above.  She denies alcohol use.   REVIEW OF SYSTEMS:  CONSTITUTIONAL:  She is 5 feet 6 inches, 195 pounds  and has had some recent weight gain.  HEENT:  She has some decline in her hearing acuity.  CARDIAC:  She has occasional chest tightness and pressure and some  shortness of breath when lying flat.  She also has a history of a heart  murmur.  PULMONARY:  She  has chronic bronchitis, asthma and wheezing.  GI:  She has reflux and constipation.  GU:  She has some urinary frequency.  ORTHOPEDIC:  She has chronic back pain, arthritis, joint pain and muscle  pain.  Her back problems are followed by Dr. Abigail Miyamoto.  NEUROLOGIC:  She has chronic headaches.  PSYCHIATRIC:  She has anxiety.   MEDICATIONS:  1. Include chloroquine 20 mEq once a day.  2. Diltiazem 240 mg once a day.  3. Protonix 40 mg once a day.  4. Gemfibrozil 600 mg twice a day.  5. Triamterine/hydrochlorothiazide 37.5/25 once a day.  6. Reglan 10 mg one by mouth four times a day before meals.  7. Celexa 20 mg once a day.  8. Xanax 0.5 mg p.r.n.  9. Cozaar 50 mg once a day.  10.Glucophage 500 mg 1/2 tablet twice a day.  11.Ultram 50 mg every 6 hours p.r.n.  12.Vicodin p.r.n.Marland Kitchen  13.Lidoderm patch p.r.n.  14.Levaquin 750 mg tablet once a day.  15.Combivent inhaler.   ALLERGIES:  She is allergic to codeine.  PHYSICAL EXAMINATION:  Vital signs:  Blood pressure is 146/74, heart  rate 89 and regular.  HEENT:  Unremarkable.  Neck:  Has 2+ carotid  pulses without bruit.  Chest:  Clear to auscultation.  Cardiac:  Regular  rate and rhythm without murmur.  Abdomen:  Slightly obese, soft,  nontender, nondistended with no masses.  Vascular:  She has 1+ femoral  pulses bilaterally.  She has absent popliteal and pedal pulses  bilaterally.  Feet are pink, warm and adequately perfused.  She has  trace edema in both lower extremities.  She has no obvious varicose  veins on the skin surface.  She has no open ulcerations on the feet.   She had a venous duplex examination today which showed no evidence of  DVT.  She did have mild reflux in the common femoral vein bilaterally.  She had mild incompetence of the left saphenofemoral junction.  The  remainder of the greater saphenous vein and the deep veins were  competent throughout.   The patient has bilateral inner thigh burning and pain.  She does  have  some evidence of mild arterial occlusive disease as well as mild venous  disease.  However, I am not sure that these two new problems completely  explain all of her symptoms.  She also has chronic back pain and  multiple joint arthritis pain as well.  I believe most likely her leg  swelling and her chronic leg pain is multifactorial in origin.  I do not  believe there is any vascular intervention warranted at this point.  We  will see her back in six months' time for repeat ABIs or she can follow  up these at [facility], whichever she prefers.  I would not consider  treating her venous incompetence currently as this is fairly mild  overall and do not believe that it would be curative of her inner thigh  burning and numbness symptoms which are her primary complaint.   Janetta Hora. Fields, MD  Electronically Signed   CEF/MEDQ  D:  10/11/2007  T:  10/12/2007  Job:  [id number]   cc:   Bertram Millard. Hyacinth Meeker, M.D.  Veverly Fells. Excell Seltzer, MD

## 2010-08-25 NOTE — Procedures (Signed)
DUPLEX DEEP VENOUS EXAM - LOWER EXTREMITY   INDICATION:  Suspected venous insufficiency.   HISTORY:  Edema:  No.  Trauma/Surgery:  No.  Pain:  Left lower extremity.  PE:  No.  Previous DVT:  No.  Anticoagulants:  No.  Other:   DUPLEX EXAM:                CFV   SFV   PopV  PTV    GSV                R  L  R  L  R  L  R   L  R  L  Thrombosis    o  o  o  o  o  o  o   o  o  o  Spontaneous   +  +  +  +  +  +  +   +  +  +  Phasic        +  +  +  +  +  +  +   +  +  +  Augmentation  +  +  +  +  +  +  +   +  +  +  Compressible  +  +  +  +  +  +  +   +  +  +  Competent     0  0  +  +  +  +  +   +  0  +   Legend:  + - yes  o - no  p - partial  D - decreased   IMPRESSION:  1. No evidence of deep venous thrombosis noted in the bilateral lower      extremities.  2. Mild reflux is noted at the common femoral vein levels bilaterally.  3. The left saphenofemoral junction demonstrates mild incompetence      with the remainder of the greater saphenous vein showing      competency.   _____________________________  Janetta Hora Fields, MD   CH/MEDQ  D:  10/11/2007  T:  10/11/2007  Job:  [id number]

## 2010-08-25 NOTE — Assessment & Plan Note (Signed)
OFFICE VISIT   [name], [name]  DOB:  [birth year]                                       07/10/2008  ZOXWR#:[id number]   The patient is a 65 year old female referred for a chronic leg pain.  She has a history of chronic back pain and has received injections for  this.  She also has a history of left hip arthritis and is being  considered for hip replacement.  She denies any claudication symptoms.  She states she does have occasional swelling in the legs.   PAST MEDICAL HISTORY:  Significant for hypertension, diabetes, COPD.   She states that the left hip is the worst pain.  She states that her  right leg is slightly better.  She was last seen in July 2009.  At that  time, the right leg was worse than the left.  Her atherosclerotic risk  factors continue to include diabetes, hypertension.  She quit smoking 14  years ago.   SOCIAL HISTORY:  She is single, disabled, has 3 children.   REVIEW OF SYSTEMS:  She has had some recent weight gain.  She is 5 feet  6 inches and 193 pounds.   PHYSICAL EXAMINATION:  Blood pressure 139/69 in the left arm, pulse is  96 and regular.  HEENT is unremarkable.  Neck:  Has 2+ carotid pulses  without bruit.  Lower extremities:  She has 2+ femorals with absent  popliteal and pedal pulses bilaterally.  Feet are pink, warm and well-  perfused.  She has no ulcerations.  She has no significant edema.   She had bilateral ABIs performed today which were 0.73 on the left and  0.76 on the right.  Waveforms were biphasic bilaterally.   In summary, I believe still that this patient has multifactorial  etiology of her lower extremity pain.  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  She currently  is not at risk of limb loss and has reasonable perfusion to both legs.  I do not believe a vascular intervention at this time  would give her  significant benefit as she is essentially asymptomatic in the right leg  and her left leg symptoms are very vague and not really consistent with  claudication-type symptoms.  She will continue to follow up with Dr.  Margaretha Sheffield and Dr. Murray Hodgkins.  She will follow up with me in six months' time  for repeat ABIs.   Janetta Hora. Fields, MD  Electronically Signed   CEF/MEDQ  D:  07/11/2008  T:  07/11/2008  Job:  [id number]   cc:   Frederica Kuster, MD  Callie Fielding, M.D.  Frazier Butt, D.O.

## 2010-08-25 NOTE — Assessment & Plan Note (Signed)
OFFICE VISIT   [name], [name]  DOB:  [birth year]                                       09/10/2009  ZOXWR#:[id number]   The patient for followup today.  She was last seen in April 2011 for  lower extremity pain.  She was sent for a CT angiogram.  She returns  today for further followup.  She still complains of pain in her left leg  which radiates from the hip all the way down to the foot.  This occurs  after walking approximately half a block.  It also becomes fairly severe  when standing still for long periods of time.  She also complains of  some pain in the medial thigh and states she has a knot there.   Her symptoms are essentially unchanged from when she was seen in April.  She states that her dizziness has now completely resolved.   On review of systems, she has dyspnea with exertion.  She denies any  chest pain.   On physical exam, blood pressure is 147/64 the right arm, heart rate is  88 and regular.  Temperature is 98.1.  lower extremities:  She has  difficult-to-palpate femoral pulses on both sides.  She has no  significant mass in the left medial thigh.  Feet are pink, warm and well-  perfused.  Skin has no ulcers or rashes.   CT angiogram images were reviewed today.  This shows a 50% left common  femoral stenosis, a 50% right common iliac stenosis.  She does have  calcification scattered throughout the arteries bilaterally, and this is  most likely the reason for her decreased femoral pulse, as well as the  fact that she is obese.   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  There is also some muscle edema of the left medial  thigh where she complains of pain.   In summary, the patient has 50% stenosis of her left common femoral  artery and 50% stenosis of her right common iliac artery.  These are not  lesions that I think would necessarily improve her symptoms if we  treated these with  angioplasty or stenting at this point.  I believe the  best option for her is to continue with her orthopedic followup as  primarily her hip seems to be the major reason for most of her lower  extremity symptoms.  I have encouraged her to follow up with her  orthopedic surgeon regarding this.  Her ABIs at her last visit were 0.8  bilaterally and this is very reasonable perfusion for both lower  extremities.  We will see her in followup in 6 months' time for repeat  ABIs.  If her symptoms become worse over time, we will consider  repeating some of her studies.     Janetta Hora. Fields, MD  Electronically Signed   CEF/MEDQ  D:  09/10/2009  T:  09/11/2009  Job:  [id number]   cc:   Dr. Jacalyn Lefevre  Frazier Butt, D.O.  Callie Fielding, M.D.

## 2010-08-25 NOTE — Assessment & Plan Note (Signed)
[facility]                            CARDIOLOGY OFFICE NOTE   NAME:[name], Fry                      MRN:          [id number]  DATE:02/15/2007                            DOB:          1945-08-21    This is going to be a new patient of Dr. Excell Seltzer. She previously saw Dr.  Samule Ohm.   This is a 65 year old white female with a history of non-obstructive  coronary artery disease by catheterization in 2001 with ejection  fraction of 78%. She also has a history of hypertension, history of lung  cancer status post left upper lobe resection and chronic obstructive  pulmonary disease. She also has had mild arterial insufficiency which  she wears stockings for. She last saw Dr. Samule Ohm in November 2007. She  comes in today complaining of six month history of fluttering of her  heart which causes her to break out into a sweat and become lightheaded.  This happens often when she is walking in [place] shopping and she has  to stop and sit down. She also notices her heart beating and fluttering  when she lies down at night and this has become bothersome. She has no  significant chest pain with this and has not had any syncope. Her last  adenosine Cardiolite was in 2005, which showed normal perfusion and her  last 2D echo was in 2005 as well, which showed normal LV size and  function.   CURRENT MEDICATIONS:  1. Klor-Con 20 mEq two daily.  2. Meloxicam 15 mg daily.  3. Combivent two puffs daily.  4. Os-cal plus D 500 mg daily.  5. Diltiazem CD 240 mg daily.  6. Prilosec 20 mg b.i.d.  7. Gemfibrozil 600 mg b.i.d.  8. Triamterene/hydrochlorothiazide 37.5/25 mg daily.  9. Aspirin 325 daily.  10.Advair 250/50 one puff b.i.d.  11.Celexa 20 mg daily.  12.Xanax 0.5 mg one-half to a whole tablet p.r.n.  13.Lasix 20 mg daily.  14.Cozaar 50 mg daily.  15.Glucophage 500 mg one-half b.i.d.   PHYSICAL EXAMINATION:  This is a pleasant, somewhat anxious, 65 year old  white female in no acute distress. Blood pressure is 120/58, pulse 87,  weight 192.  NECK: Without JVD, HJR, bruit or thyroid enlargement.  LUNGS:  Decreased breath sounds, but are clear anterior, posterior and  lateral.  HEART: Regular rate and rhythm at 87 beats per minute. Normal S1, S2  with a 1-2/6 systolic murmur at the left sternal border.  ABDOMEN: Soft without organomegaly, masses, lesions or abnormal  tenderness.  EXTREMITIES: She has compression hose on, but she has positive distal  pulses. No edema.   IMPRESSION:  1. Palpitations with sweating and lightheadedness for the past six      months.  2. History of non-obstructive coronary artery disease on      catheterization in 2001.  3. Diabetes mellitus.  4. Hypertension.  5. History of lung cancer, status post left upper lobe resection.  6. Hypercholesterolemia.  7. Chronic obstructive pulmonary disease.   PLAN:  At this time, we will place an event recorder on this patient to  determine  what tachy palpitations she is having. Will also order an  adenosine Cardiolite to rule out progression of coronary artery disease  given her multiple risk factors for this. I have asked her to decrease  her caffeine intake. She drinks one cup of coffee in the morning and  also drinks at least two sodas or teas in the afternoon. She will see  Dr. Excell Seltzer back after these tests are performed this month.      Jacolyn Reedy, PA-C  Electronically Signed      Jonelle Sidle, MD  Electronically Signed   ML/MedQ  DD: 02/15/2007  DT: 02/15/2007  Job #: [id number]   cc:   Jarome Matin, M.D.

## 2010-08-25 NOTE — Telephone Encounter (Signed)
Pt requested appt with TP on Thursday-appt set up for 300pm.[name] [facility]

## 2010-08-25 NOTE — Assessment & Plan Note (Signed)
[facility]                            CARDIOLOGY OFFICE NOTE   NAME:[name], [name]                      MRN:          [id number]  DATE:10/31/2007                            DOB:          1945/10/03    HISTORY:  [name] was seen in followup at our [facility]  [facility] on October 31, 2007.  She is a 65 year old woman with nonobstructive  CAD, hypertension, peripheral arterial disease, and history of lung  cancer.  [name] presents for regular followup.  When she was seen  last she had complained of palpitations and she underwent an event  recorder.  This showed sinus rhythm throughout with a rare brief episode  of paroxysmal SVT.  She had no atrial fibrillation or ventricular  arrhythmias.  Her most recent stress test was November 2008, which  demonstrated hyperdynamic LV function with a gated EF of 84% and normal  perfusion.   From a symptomatic standpoint, [name] complains of burning along the  inner aspect of her left thigh.  She was recently evaluated by Dr.  Darrick Penna who recommended conservative therapy.  She has no DVT and her  lower extremity arterial disease is only mild.  He felt that her pain  was atypical for a vascular problem.  She denies orthopnea, PND, chest  pain, or edema.  She has chronic dyspnea that is unchanged over time.  She has multiple aches and pains that are longstanding.   MEDICATIONS:  1. Klor-Con 20 mEq 2 daily.  2. Combivent 2 puffs daily.  3. Os-Cal plus D 500 mg daily.  4. Cardizem CD 240 mg daily.  5. Cozaar 50 mg b.i.d.  6. Prilosec 20 mg b.i.d.  7. Gemfibrozil 600 mg b.i.d.  8. Triamterene/HCTZ 37.5/25 mg daily.  9. Aspirin 325 mg daily.  10.Advair 250/50 mcg twice daily.  11.Celexa 20 mg daily.  12.Xanax as needed.  13.Glucophage 250 mg twice daily.   ALLERGIES:  PENICILLIN and CODEINE.   PHYSICAL EXAMINATION:  GENERAL:  She is alert and oriented.  No acute  distress.  VITAL SIGNS:  Weight  is 199 pounds, blood pressure 140/55, heart rate  86, and respiratory rate 16.  HEENT:  Normal.  NECK:  Normal carotid upstrokes with soft right carotid bruit.  JVP  normal.  LUNGS:  Clear to auscultation bilaterally.  HEART:  Regular rate and rhythm.  There are no murmurs or gallops.  ABDOMEN:  Soft, nontender, no organomegaly.  EXTREMITIES:  No clubbing, cyanosis, or edema.   ASSESSMENT:  1. Nonobstructive coronary artery disease.  Cardiac catheterization      from 2001 showed an eccentric 40-50% stenosis in left anterior      descending.  No angina at present.  Negative stress test within      last 12 months.  Continue Medical Therapy as outlined above.  She      is not a candidate for beta-blockers because of significant lung      disease.  2. Lower extremity peripheral arterial disease.  Mildly abnormal ABIs.      Continue conservative management.  No  claudication symptoms.  3. Dyslipidemia.  Lipids have been at goal on gemfibrozil.  She is      followed by Dr. Hyacinth Meeker.  4. Hypertension.  Blood pressure remains well controlled on diltiazem.   For followup, I would like to see [name] back in 6 months.  I will be  happy to see her sooner if any new problems arise.     Veverly Fells. Excell Seltzer, MD  Electronically Signed    MDC/MedQ  DD: 10/31/2007  DT: 11/01/2007  Job #: [id number]   cc:   Bertram Millard. Hyacinth Meeker, M.D.

## 2010-08-25 NOTE — Assessment & Plan Note (Signed)
OFFICE VISIT   [name], [name]  DOB:  07/19/1945                                       07/16/2009  QMVHQ#:[id number]   The patient returns for followup today.  We have followed her for some  time for lower extremity occlusive disease.  She has had multiple  symptoms of chronic leg pain.  She states that she does not walk much  because she is limited by her multiple arthritis conditions.  She is  considering a left hip replacement currently.  She also has degenerative  disease in her back.  She does not really describe classic claudication  symptoms but does describe fatigue, achiness in her hips, thighs and  lateral calf muscles.  She denies rest pain.  She does not have any  areas of ulceration on her feet that have had difficulty healing.   PAST MEDICAL HISTORY:  Is unchanged from her last visit in March of  2010.   REVIEW OF SYSTEMS:  Also unchanged.   SOCIAL HISTORY:  Also unchanged.   Of note, she also complains today of dizziness that has been going on  for approximately 3 months.  She states that she gets dizzy if she bends  over or when she goes quickly from a lying flat to a sitting up  position.  She denies any frank symptoms of TIA, amaurosis or stroke.  She denies any recent change in her hearing.  She has no ringing in the  ears.  She states she does have chronic sinusitis.   MEDICATIONS:  Include vitamin D, calcium, calcitonin, fish oil,  Miacalcin nasal spray, Advair, ProAir, Combivent, hydrocodone, Ultram,  Xanax, potassium chloride, gemfibrozil, aspirin, diltiazem,  triamterene/hydrochlorothiazide, Cozaar, senna, meloxicam, Lyrica,  Celexa, metformin, Zyrtec, Tagamet and Lidoderm patches.   ALLERGIES:  She has allergy listed to codeine.   PHYSICAL EXAM:  Vital signs:  Blood pressure is 147/73 in the right arm,  143/71 in the left arm in a lying position; when sitting her blood  pressure decreased to 117 systolic on the right, 120  systolic on the  left; and standing her blood pressure decreased to 104 on the right and  was 132 on the left.  She did have some dizziness with going from a  lying to sitting position.  HEENT:  Unremarkable.  Neck:  Has 2+ carotid  pulses without bruit.  Chest:  Clear to auscultation.  Cardiac:  Exam is  regular rate and rhythm without murmur.  Abdomen:  Soft, nontender,  nondistended.  No masses.  Extremities:  She has difficult to palpate  femoral, popliteal and pedal pulses bilaterally.  She has no significant  edema in the lower extremities.  Musculoskeletal:  Exam shows no obvious  major joint deformities.  Neurologic:  Exam shows symmetric upper  extremity and lower extremity motor strength which is 5/5.  Skin:  Has  no open ulcers or rashes.   She had bilateral ABIs performed today which were 0.8 on the left and  0.89 on the right.   In summary, the patient still has bilateral lower extremity pain which I  believe is multifactorial in nature but I did have more difficulty  palpating her femoral pulses today and her ABIs have decreased somewhat  since the last time I saw her.  I believe the best option at this point  would  be to do a CT angiogram with lower extremity runoff to further  define her arterial tree bilaterally.  If she has evidence of iliac  occlusive disease we might consider angioplasty or stenting.  However,  if she has diffuse disease I would continue conservative management.   As far as her dizziness is concerned we will arrange for her to have a  carotid duplex exam when she returns for her followup.  If this does not  suggest any high-grade carotid stenosis then I will refer her back to  her primary physician for further evaluation and workup of her  dizziness.  Of note, she did not seem to be truly orthostatic on exam  today.  However, she is on several medications that could certainly be  the cause of her symptoms of dizziness.     Janetta Hora. Fields,  MD  Electronically Signed   CEF/MEDQ  D:  07/16/2009  T:  07/17/2009  Job:  [id number]   cc:   Bertram Millard. Hyacinth Meeker, M.D.  Frazier Butt, D.O.  Callie Fielding, M.D.

## 2010-08-27 ENCOUNTER — Ambulatory Visit: Payer: Self-pay | Admitting: Adult Health

## 2010-08-28 NOTE — H&P (Signed)
NAME:  [name], [name] NO.:  [id number]   MEDICAL RECORD NO.:  [id number]           PATIENT TYPE:   LOCATION:                                 FACILITY:   PHYSICIAN:  Thomos Lemons, D.O. [facility]   DATE OF BIRTH:  [birth year]   DATE OF ADMISSION:  01/19/2005  DATE OF DISCHARGE:                                HISTORY & PHYSICAL   CHIEF COMPLAINT:  Syncope and shortness of breath.   HISTORY OF PRESENT ILLNESS:  The patient is a 65 year old white female with  past medical history of coronary artery disease, type 2 diabetes, who comes  to see me today regarding episode of syncope and shortness of breath.  The  patient states she had previous episodes of feeling dizzy in the past, was  seen by her cardiologist who did Holter monitor approximately 6 months ago.  The patient states that she has been doing fairly well, but on Sunday this  past weekend, the patient was in a grocery store and felt hot and short of  breath.  The patient states that her symptoms were nonexertional, but the  patient felt like she had to sit down or she was going to pass out.  The  patient states that during her previous episodes, she has had recurrence of  this hot sensation before feeling dizzy.  The patient also notes that she  felt sharp shooting pain go up her neck during this time.  The patient was  taken home with daughter.  The patient does live at home.  The patient  states she has not felt quite right since the weekend.  She feels weak and  has had worsening shortness of breath.  No other associated symptoms, no  nausea or vomiting or diarrhea.  The patient denies any recent fevers.   PAST MEDICAL HISTORY:  1.  Nonobstructive coronary artery disease status post cardiac      catheterization in 2001.  2.  History of lung cancer status post upper lobe resection in 1986.  3.  Cardiolite stress test in January 2005, negative for ischemia, normal      perfusion, EF approximately 70%.  4.  History  of 2-D echocardiogram with normal EF, mild pericardial effusion.  5.  Type 2 diabetes.  6.  Anemia.  7.  Peripheral vascular disease with abnormal ankle brachial indices.  8.  Probable peripheral neuropathy.  9.  Depression.   CURRENT MEDICATIONS:  1.  Aspirin 325 mg once a day.  2.  Klor-Con 20 mEq once a day.  3.  Diltiazem 240 mg once a day.  4.  Prilosec 20 mg once a day.  5.  Gemfibrozil 600 mg twice daily.  6.  Triamterene/hydrochlorothiazide 25/37.5 one a day.  7.  Lexapro 10 mg once a day.  8.  Lorazepam 1 mg nightly p.r.n.  9.  Metoclopramide 10 mg before meals or food and at bedtime.  10. Combivent inhaler 4 times a day.  11. Lidoderm patch p.r.n.   ALLERGIES TO MEDICATIONS:  PENICILLIN, CODEINE.   SOCIAL HISTORY:  The patient is divorced, has  3 children.  She is a disabled  Interior and spatial designer.   FAMILY HISTORY:  Significant for mother deceased at age 68 with diabetes,  father deceased at age 50 secondary to bone cancer.  The patient has two  brothers deceased, one living.  There is history of emphysema in the family.   HABITS:  Denies any alcohol.  She quit tobacco approximately 6 years ago.  The patient smoked since age 1 approximately 1 pack per day.   REVIEW OF SYSTEMS:  As above, all other systems negative.   PHYSICAL EXAMINATION:  VITAL SIGNS: Weight 181 pounds.  Temperature 98.  Pulse 90, blood pressure 137/65.  Per previous weight in June was 180  pounds.  GENERAL:  The patient is a 65 year old white female, pleasant, in no  apparent distress.  HEENT:  Normocephalic and atraumatic.  Pupils equal, round, and reactive to  light bilaterally.  Extraocular movements intact.  The patient was  anicteric.  Conjunctivae within normal limits.  Auditory canals and tympanic  membranes were clear bilaterally. The patient does not have any evidence of  nystagmus.  NECK:  Supple.  No adenopathy or carotid bruits.  No JVD was appreciated.  RESPIRATORY: Normal respiratory  effort.  CHEST:  Clear to auscultation bilaterally. No rhonchi, rales, or wheezing.  CARDIOVASCULAR: Regular rate and rhythm.  No significant murmurs, rubs, or  gallops appreciated.  ABDOMEN: Soft, nontender. Positive bowel sounds. No organomegaly.  MUSCULOSKELETAL: No significant edema.  NEUROLOGIC: Cranial nerves II-XII grossly intact. No focal deficits.   The patient did have blood pressure sitting and standing.  Sitting, they  were approximately 148/70 and 150/70 standing.   IMPRESSION:  1.  Presyncope and shortness of breath in 65 year old with history of      nonobstructive coronary disease.  2.  Type 2 diabetes.  3.  Hypertension.  4.  Peripheral vascular disease.  5.  Anemia.  6.  History of lung cancer status post left upper lobe resection.   RECOMMENDATIONS:  1.  With prodromal symptoms of feeling hot, I suspect the patient may have      had vasovagal reaction.  However, with worsening shortness of breath and      history of coronary artery disease, we will admit the patient for      observation.  We will order a D-dimer, cardiac enzymes, EKG, and chest x-      ray, and patient will likely need a repeat echocardiogram while she is      hospitalized, and I will ask cardiology to evaluate the patient.  2.  We will continue her outpatient medications and check her blood sugar      before meals or food and at bedtime and cover with NovoLog sensitive      sliding scale.  3.  She will also receive albuterol and Atrovent nebulizers p.r.n.      Thomos Lemons, D.O. [facility]  Electronically Signed     RY/MEDQ  D:  01/19/2005  T:  01/19/2005  Job:  [id number]

## 2010-08-28 NOTE — Cardiovascular Report (Signed)
NAME:  [name], [name]               ACCOUNT NO.:  [id number]   MEDICAL RECORD NO.:  [id number]          PATIENT TYPE:  INP   LOCATION:  3703                         FACILITY:  [facility]   PHYSICIAN:  Arturo Morton. Riley Kill, M.D. [facility] OF BIRTH:  24-Jun-1945   DATE OF PROCEDURE:  01/21/2005  DATE OF DISCHARGE:                              CARDIAC CATHETERIZATION   INDICATIONS:  The patient is a 65 year old female who has a history of  having had near syncope. She was short of breath. Her daughter tells me that  she had hurried her through the grocery store and then she became weak. She  was brought to the emergency room and subsequently seen by Dr. Samule Ohm who  recommended cardiac catheterization.   PROCEDURES:  1.  Left heart catheterization  2.  Selective coronary arteriography.  3.  Selective left ventriculography.   DESCRIPTION OF PROCEDURE:  The patient was brought to the catheterization  laboratory, prepped and draped in the usual fashion. Through an anterior  puncture, the left femoral artery was entered using a Smart needle. Views of  left and right coronary arteries were obtained in multiple angiographic  projections. She tolerated procedure well. There were no complications. She  was taken to the holding area in satisfactory clinical condition.   HEMODYNAMIC DATA:  1.  Central aortic pressure 147/68, mean 100.  2.  Left ventricular pressure 145/29.  3.  No gradient on pullback across the aortic valve.   ANGIOGRAPHIC DATA:  1.  Ventriculography was preformed in the RAO projection. Overall systolic      function was very vigorous. No segmental abnormalities contraction were      identified.  2.  The left main was free of critical disease.  3.  The left anterior descending artery, as on the previous study,      demonstrates probably about 30-50% eccentric plaquing in the LAD just      beyond the septal perforator. This does not appear to be substantially      changed. The mid and  distal vessel are without critical disease.  4.  The circumflex provides a very large marginal branch which bifurcates      distally and appears with only minimal luminal irregularity.  5.  The right coronary is very large caliber vessel without significant      focal narrowing. It appears quite smooth.   CONCLUSION:  1.  Well-preserved left ventricular function.  2.  No significant interval change from the previous catheterization of      2001.   DISPOSITION:  Further disposition will be by Dr. Samule Ohm regarding further  evaluation and treatment.      Arturo Morton. Riley Kill, M.D. [facility]  Electronically Signed     TDS/MEDQ  D:  01/21/2005  T:  01/21/2005  Job:  [id number]

## 2010-08-28 NOTE — Assessment & Plan Note (Signed)
[facility]                               PULMONARY OFFICE NOTE   NAME:HIATTOnda, Kattner                      MRN:          [id number]  DATE:12/28/2005                            DOB:          [birth year]    I saw Ms. Beissel today for evaluation of her increased dyspnea and sinus  congestion.  She had seen Dr. Artist Pais approximately two weeks ago and was  started on Rhinocort and Singulair.  She says her symptoms have persisted  since then.  She says the Singulair has helped to some degree, both with the  sinus symptoms as well as with the breathing.  She was not able to tolerate  using the Rhinocort because she said it made her too dry in her nose.  She  has been trying to use saline nasal drops, and this helped to some degree.  She says she has a lot of nasal congestion with postnasal drip but is not  really having much as far as sputum production.  She says that when she does  cough, she brings up a whitish-yellowish sputum but denies having any  hemoptysis.  She is not having any epistaxis either.  She said she has been  using her Combivent inhaler 2-3 times a day for the last several weeks  because of increased feeling of shortness of breath.  She says that she has  not been getting much as far as wheezing, however.  She had tried using  Spiriva before but said this made her mouth too dry, and she is able to  tolerate using the Combivent better.  She has not had any fevers or chest  pain, nausea, vomiting, abdominal pain, rashes, or diarrhea.   CURRENT MEDICATIONS:  1. Neurontin 100 mg b.i.d. and 300 mg nightly.  2. Celexa 20 mg daily.  3. Xanax 0.5 mg daily p.r.n.  4. Combivent 2 puffs t.i.d.  5. Singulair 10 mg nightly.  6. Advair 250/50 1 puff b.i.d.  7. Lasix 20 mg daily p.r.n.   PHYSICAL EXAMINATION:  GENERAL:  She is 193 pounds.  Temperature is 98.1,  blood pressure 110/72, heart rate 77, oxygen saturation is 94% on room air.  HEENT:  Pupils  are reactive.  She has tenderness over the ethmoid sinus  area.  She has dried nasal mucosa with some clear nasal discharge and  prominent nasal turbinates.  She has mild erythema at the posterior pharynx.  NECK:  There is no lymphadenopathy.  HEART:  S1 and S2.  Regular rhythm.  CHEST:  No wheezing or rales.  ABDOMEN:  Obese, soft, nontender.  EXTREMITIES:  Minimal ankle edema.   IMPRESSION:  1. Acute sinusitis:  I will change her over from Rhinocort to Veramyst 2      sprays in each nostril once daily, and I have also given her a      prescription for Astelin 2 sprays in each nostril b.i.d., and I have      given her samples for both of these as well, and I have had my nurse  instruct on the proper use of these.  I will have her continue on her      Singulair.  I have instructed her on the use of nasal irrigation as      well as continuing the use of her saline nasal drops.  I do not feel      that she would need to have antibiotics at this time, although if her      symptoms are persistent, then a course of antibiotics may be necessary.  2. Chronic obstructive pulmonary disease with an asthmatic component.  I      will continue her on her regimen of Advair 250/50 1 puff daily and      Combivent 2 puffs t.i.d. to q.i.d. as needed.  I plan on following up      with her in approximately one month.                                   Coralyn Helling, MD   VS/MedQ  DD:  12/28/2005  DT:  12/29/2005  Job #:  [id number]   cc:   Barbette Hair. Artist Pais, DO

## 2010-08-28 NOTE — Consult Note (Signed)
NAME:  [name], [name] NO.:  [id number]   MEDICAL RECORD NO.:  [id number]          PATIENT TYPE:  INP   LOCATION:  3703                         FACILITY:  [facility]   PHYSICIAN:  Doylene Canning. Ladona Ridgel, M.D.  DATE OF BIRTH:  29-Sep-1945   DATE OF CONSULTATION:  01/19/2005  DATE OF DISCHARGE:                                   CONSULTATION   REQUESTING PHYSICIAN:  Thomos Lemons, D.O.   INDICATIONS FOR CONSULTATION:  Evaluation of chest pain, shortness of breath  and presyncope.   HISTORY OF THE PRESENT ILLNESS:  The patient is a 65 year old woman with a  history of known coronary disease, not obstructing by catheterization in  2001 and with a history of lung cancer status post resection over 20 years  ago.  She has longstanding COPD.  The patient has Type 2 diabetes and  dyslipidemia.  She notes that over the last several weeks she has  increasingly frequency episodes characterized by shortness of breath,  substernal chest discomfort and near syncope.  She states that when she  overdoes at all, she will feel all the above symptoms, get lightheaded and  nearly pass out.  When she sits down to rest symptoms/spells eventually  resolve after several minutes.  As these spells have increased in frequency  and severity she was seen by Dr. Artist Pais and admitted for additional evaluation.   PAST MEDICAL HISTORY:  Additional past medical history is notable for:  1.  Gastritis.  2.  History of chronic sinusitis.  3.  History of left upper lobe of the lung resected.  4.  History of depression.  5.  History of anemia.   SOCIAL HISTORY:  The patient lives in [place].  She is disabled.  She has  a longstanding tobacco use history, stopping six years ago.  She denies  alcohol abuse.   FAMILY HISTORY:  The family history is notable for mother dying of  complications of diabetes.  Father deceased secondary to bone cancer.   REVIEW OF SYSTEMS:  CONSTITUTIONAL:  The patient denies fevers,  chills night  sweats and adenopathy.  HEENT:  The patient denies headaches, vision or  hearing problems.  The patient denies photophobia or changes in vision.  INTEGUMENT:  The patient denies any skin rashes or lesions.  CARDIOPULMONARY:  The patient admits to chest pain, shortness of breath and  dyspnea on exertion.  She denies PND or peripheral edema.  She denies  syncope, but does have claudication.  GENITOURINARY: The patient denies  dysuria, hematuria and nocturia.  NEUROLOGIC:  The patient denies weakness,  numbness, depression or anxiety.  MUSCULOSKELETAL:  The patient denies  arthralgias or arthritis.  GASTROINTESTINAL:  The patient denies nausea,  vomiting, diarrhea and constipation.  ENDOCRINE:  The patient denies  polyuria, polydipsia, heat or cold intolerance.  The st of her review of  systems is negative,   PHYSICAL EXAMINATION:  GENERAL APPEARANCE:  On physical exam she is a  pleasant middle-aged woman in no distress.  VITAL SIGNS:  Blood pressure is 149/60, the pulse is 78 and regular,  respirations are  20 and temperature is 98.4.  HEENT EXAMINATION:  Normocephalic and atraumatic.  Pupils are equal and  round.  Oropharynx is moist.  The sclerae are anicteric.  NECK:  The neck reveals approximately 7-8 cm of jugular venous distention at  45 degrees.  There is no thyromegaly.  The trachea is midline.  The carotids  are 1+ and symmetric bilaterally.  HEART:  Cardiovascular exam reveals a regular rate and rhythm with normal S1  and S2.  There is an S4 gallop present.  LUNGS:  The lungs reveal decreased breath sounds throughout.  There are  scant rales at the bases.  ABDOMEN:  The abdominal exam is soft, nontender and nondistended.  There is  no organomegaly.  Bowel sounds are present.  There is no rebound or  guarding.  EXTREMITIES:  The extremities demonstrate no cyanosis, clubbing or edema.  The pulses are 1+ and symmetric bilaterally.  SKIN:  The skin is warm and dry.   NEUROLOGIC: The neurologic exam reveals the patient to be alert and oriented  times three.  Cranial nerves II-XII are intact. Strength is 5/5 and  symmetric.   LABORATORY DATA:  The EKG is not on the chart at the time of this dictation.   IMPRESSION:  1.  Worsening chest pressure and shortness of breath associated with      dizziness and presyncope.  2.  Peripheral vascular disease.  3.  Nonobstructive coronary artery disease by catheterization five years      ago.  4.  Diabetes.  5.  Dyslipidemia.  6.  History of tobacco use.   DISCUSSION:  The patient's symptoms are concerning for worsening of her LAD  stenosis despite a stress test in the last eight months demonstrating no  obvious ischemia.  With all the above I recommend that she proceed with  catheterization to define her coronary anatomy.  I would also recommend  keeping her on cardiac monitoring to rule out arrhythmias as the cause of  her near syncope and dizzy spells, although I doubt this.           ______________________________  Doylene Canning. Ladona Ridgel, M.D.     GWT/MEDQ  D:  01/19/2005  T:  01/20/2005  Job:  [id number]   cc:   Thomos Lemons, D.O. [facility]  [address] [place], [place] [address]

## 2010-08-28 NOTE — Assessment & Plan Note (Signed)
[facility]                                   ON-CALL NOTE   NAME:HIATTChizaram, Latino                        MRN:          [id number]  DATE:01/07/2006                            DOB:          09/20/1945    PRIMARY CARE PHYSICIAN:  Dr. Artist Pais is her regular doctor.  She said she also  sees [facility], and she sees Dr. Shawnie Pons as well.   CHIEF COMPLAINT:  Query DVT.   HISTORY OF PRESENT ILLNESS:  The patient states her left leg has been  hurting for several days, but now she has a lump between her knee and ankle.  It is very sore, swollen, and puffy, and it hurts to walk on.  She does have  a history of blood clots and has a blood clot under her heart valve as  well.  She is unsure how she is treated for that except she is on aspirin  and she does wear support hose.   ASSESSMENT/PLAN:  After reviewing the symptoms, I told her I could not tell  her if this was a deep clot or just phlebitis and because of that, she  needed evaluation right away, and she is going to the emergency room to be  evaluated.            ______________________________  Audrie Gallus. Tower, MD      MAT/MedQ  DD:  01/07/2006  DT:  01/10/2006  Job #:  [id number]   cc:   Barbette Hair. Artist Pais, DO  Marne A. Milinda Antis, MD

## 2010-08-28 NOTE — Op Note (Signed)
   NAME:  [name], [name]                         ACCOUNT NO.:  [id number]   MEDICAL RECORD NO.:  [id number]                   PATIENT TYPE:  AMB   LOCATION:  CARD                                 [facility]AC[facility]  [facility]   PHYSICIAN:  Oley Balm. Sung Amabile, M.D. [facility]          DATE OF BIRTH:  May 28, 1945   DATE OF PROCEDURE:  02/07/2002  DATE OF DISCHARGE:  02/07/2002                                 OPERATIVE REPORT   PROCEDURE:  Bronchoscopy.   INDICATIONS:  1. History of lung cancer.  2. Progressive dyspnea.  3. Isolated wheeze in the left upper lobe, rule out endobronchial tumor.   PREMEDICATION:  Fentanyl 100 mcg IV, Versed 6 mg IV.   ANESTHESIA:  Topical anesthesia was applied to the nose and throat and 70 cc  of 1% lidocaine were used during the course of the procedure.   DESCRIPTION OF PROCEDURE:  After adequate sedation and anesthesia, the  bronchoscope was introduced via the right naris and advanced in the  posterior pharynx.  This demonstrated normal upper airway anatomy.  Vocal  cords moved symmetrically.  Further anesthesia was achieved with 1%  lidocaine and the scope was advanced in the trachea.  Complete airway  anesthesia was achieved with 1% lidocaine, and an airway examination was  performed.  This demonstrated surgical absence of the left upper lobe.  The  bronchus to the superior segment of the left lower lobe was twisted and was  difficult to cannulate with the scope.  However, this could be achieved.  No  endobronchial tumors, masses, or foreign bodies were seen.  Endobronchial  mucosa was normal.  There were minimal secretions.  After completion of the  airway examination, the scope was retracted and no specimens were obtained.   IMPRESSION:  Isolated wheeze in the left apex is due to distortion of the  airway to the superior segment of the left lower lobe.  No evidence of  recurrent endobronchial tumor.                                               Oley Balm Sung Amabile,  M.D. [facility]    DBS/MEDQ  D:  02/09/2002  T:  112-10-2001  Job:  [id number]   cc:   Fleet Contras, M.D.  [address]  [place]  [place] [address]  Fax: [phone]

## 2010-08-28 NOTE — Discharge Summary (Signed)
[name], CANCHE NO.:  [id number]   MEDICAL RECORD NO.:  [id number]          PATIENT TYPE:  INP   LOCATION:  3703                         FACILITY:  [facility]   PHYSICIAN:  Wanda Plump, MD [facility]    DATE OF BIRTH:  10/23/1945   DATE OF ADMISSION:  01/19/2005  DATE OF DISCHARGE:  01/23/2005                                 DISCHARGE SUMMARY   PRIMARY CARE PHYSICIAN:  Dr. Artist Pais.   ADMITTING DIAGNOSIS:  Presyncope.   DISCHARGE DIAGNOSES:  1.  Presyncope of unclear etiology.  The patient has been stable during this      hospital admission, a cardiac catheterization was basically negative and      a CT of the head was also negative.  D-Dimer was normal and she is to      follow-up with Dr. Artist Pais.  2.  Diabetes type 2 previously on diet control.  She will be discharged on      Glucophage at home, her blood sugar in the hospital has varied from the      200s to the 120s.  3.  Hypertension.  This has been well-controlled.  4.  She was found to have mild anemia, the workup included an iron level of      45 which is low-normal, ferritin of 12 which is also low-normal, B12 and      folic acid level that are normal.  Further workup as needed as      outpatient.  5.  The patient has also chronic obstructive pulmonary disease and      complained of shortness of breath.  She was evaluated by pulmonary and      she was given a new set of medicines and diagnosed also with early      sinusitis.  6.  History of peripheral vascular disease which is stable.  7.  History of depression, stable on Lexapro.  8.  History of lung cancer status post upper lobe resection.   BRIEF HISTORY AND PHYSICAL:  [name] is a 65 year old white female who was  admitted by Dr. Artist Pais with a history of presyncope and shortness of breath.   PHYSICAL EXAMINATION:  She was alert, in no apparent distress.  NECK:  Supple with no adenopathy or carotid bruits, no JVD.  CHEST:  Clear to auscultation.  CARDIOVASCULAR:  Regular rate and rhythm with no significant murmurs.  ABDOMEN:  Unremarkable.  NEUROLOGIC:  Exam shows no focal deficits.   LABORATORY AND X-RAYS:  Initial chest x-ray show emphysema, CT of the head  with and without contrast preliminary report is negative.  D-Dimer was  negative, BNP was less than 30, cardiac enzymes negative, folate and B12  levels normal, CBC shows white count 9.6, hemoglobin 10.8, platelets 396.  Potassium was 3.4, creatinine 0.8, glucose 148.   HOSPITAL COURSE:  [name] was admitted to the hospital with above  complaints.  We consulted Cardiology, they performed an echo that shows left  ventricular ejection fraction of 65% and it was basically normal, please see  the complete report for details.  She also had cardiac catheterization which  was basically normal.  She was also seen by Pulmonary who evaluated her and  adjusted her medications.  At the time of discharge she was feeling better,  the etiology of her presyncopal spells is not clear however, I feel that  since she does not have coronary artery disease, and the workup has been  negative, she will be able to go home with the following instructions:  1.  Advair 250/50 one puff twice a day.  2.  Combivent p.r.n.  3.  Avelox 400, one p.o. daily.  4.  Mucinex over the counter one p.o. b.i.d.  5.  We will start her on Glucophage 500 mg one pill a day.  She is to continue with her regular medications that include  1.  Aspirin.  2.  Potassium.  3.  Diltiazem.  4.  Triamterine/HCTZ.  5.  Lexapro.  6.  Cozaar.  7.  Lopid.  8.  Allergy medicine.  9.  She is to stop the Serevent disk and use the Advair  instead.  10. She is to use two Prilosec instead of one before breakfast.  11. She is to call Dr. Artist Pais for an appointment next week.  12. She is advised to go to the emergency room or call her physician should      the presyncope resurface.      Wanda Plump, MD [facility]  Electronically  Signed     JEP/MEDQ  D:  01/23/2005  T:  01/23/2005  Job:  [id number]   cc:   Thomos Lemons, D.O. [facility]  [address] [place], [place] [address]

## 2010-08-28 NOTE — Progress Notes (Signed)
[facility]                          PERIPHERAL VASCULAR OFFICE NOTE   NAME:[name], [name]                      MRN:          [id number]  DATE:02/23/2006                            DOB:          [birth year]    PRIMARY CARE PHYSICIAN:  Jarome Matin, M.D.   HISTORY OF PRESENT ILLNESS:  [name] is a 65 year old lady with non-  obstructive coronary disease by cardiac catheterization in 2001.  EF of 70%.  I saw her 6 weeks ago with the principal complaint of bilateral leg  discomfort, both at exertion and at rest.  She told me she had previously  seen Dr. Arbie Cookey for a similar problem, but did not wish to return to him.  We  obtained his records.  He had found that she had mild arterial insufficiency  with ABIs becoming abnormal with exercise, but promptly returning to normal.  He had recommended conservative management.  I obtained ABIs which were  normal on the right and mildly abnormal on the left (0.81).   Her symptoms continue unchanged.  Particularly in the left leg, she has hip  and thigh discomfort particularly in the evening while sitting down watching  TV.  It is less of a problem with walking.  She has recently also strained  her right hamstring.   CURRENT MEDICATIONS:  Unchanged from January 13, 2006.   PHYSICAL EXAMINATION:  GENERAL:  She is overweight, but otherwise well  appearing, in no distress.  VITAL SIGNS:  Heart rate of 71, blood pressure 135/62, and weight of 195  pounds.  Weight is stable from a month ago.  NECK:  No JVD, no thyromegaly, and no lymphadenopathy.  LUNGS:  Respiratory effort is normal.  Lungs were clear to auscultation.  CARDIAC:  She has a non-displaced point of maximal cardiac impulse.  There  is a regular rate and rhythm with a 1/6 holosystolic murmur along the left  sternal border and at the apex.  There is no S3 or S4.  ABDOMEN:  Soft, nondistended, nontender.  There is no hepatosplenomegaly.  Bowel  sounds are normal.  EXTREMITIES:  Warm and without edema.  Carotid pulses 2+ bilaterally without  bruits.  Femoral pulses trace or distant bilaterally.  DP and PT not  palpable on either side.   Lower extremity duplex scan performed on January 18, 2006 demonstrates an ABI  of 1.0 on the right and 0.81 on the left.  She has a moderate left common  femoral stenosis with a peak systolic velocity of 298 cm per second, as well  as elevated velocities in the ostium of the SFA.   IMPRESSION/RECOMMENDATION:  1. Left pain.  While the patient does have atherosclerotic peripheral      arterial disease, it is nowhere near severe enough to account for the      predominant symptom of rest pain in her thigh.  Instead, I think this      is musculoskeletal.  I just think that her peripheral arterial disease      is either asymptomatic or very minimally symptomatic.  In either case,  we will continue second prevention efforts and conservative management.  2. Nonobstructive coronary disease.  Continue secondary prevention.  3. Hypertension.  Blood pressure a bit elevated today.  It was nicely      controlled at my last visit, and, as well, was excellent at the time of      her duplex examination of January 18, 2006.  Will make no changes in her      medication, therefore.  4. History of lung cancer, status post left upper lobe resection,      presumably cured.  5. Hypercholesterolemia, managed by Dr. Milinda Antis.  6. Chronic obstructive pulmonary disease, managed by Dr. Milinda Antis and Dr.      Sung Amabile.   I will see her back in a year's time.     Kathleen Farber, MD  Electronically Signed    WED/MedQ  DD: 02/23/2006  DT: 02/23/2006  Job #: [id number]   cc:   Jarome Matin, M.D.

## 2010-08-28 NOTE — Discharge Summary (Signed)
[place]. [facility]  Patient:    [name], [name]                        MRN: Adm. Date:  07/08/99 Disc. Date: 07/09/99 Attending:  Nathen May, M.D., [facility] Dictator:   Rozell Searing, P.A. CC:         Oley Balm. Sung Amabile, M.D. [facility]             Dr. Jason Fila                           Discharge Summary  PROCEDURE:  Cardiac catheterization on July 08, 1999.  REASON FOR ADMISSION:  Ms. Formby is a 65 year old female, without prior history of heart disease, who underwent recent evaluation for atypical chest pain and subsequent diagnostic workup.  A dobutamine Cardiolite scan was performed, and it revealed a suggestion of mild anterior wall ischemia; 86%. The patient was referred to Dr. Odessa Fleming for review of these findings and further evaluation.  He recommended proceeding with diagnostic coronary angiogram.  PREADMISSION LABORATORY DATA:  CBC within normal limits.  BMP revealed elevated glucose at 127, otherwise normal.  INR of 0.9.  HOSPITAL COURSE:  Cardiac catheterization performed by Dr. Ermalene Postin (see catheterization report for full details) revealed 40-50% mid LAD with otherwise normal CFX/CA.  Left ventriculogram was normal.  Dr. Riley Kill recommended cardiopulmonary rehabilitation.  Coronary angiogram results were reviewed by Dr. Graciela Husbands, who in turn spoke with Drs. Simonds and [place].  Dr. Graciela Husbands suggested systolic hypertension as possible etiology for diastolic dysfunction.  He also noted modest tachycardia.  Thus, he suggested initiation of long-acting Diltiazem 120 mg q.d. to be added to the previous home medication regimen.  No further cardiac workup or followup was recommended.  DISCHARGE MEDICATIONS: 1. Coated aspirin 325 mg q.d. 2. Cardizem CD 120 mg q.d. 3. Axid 150 mg b.i.d. 4. BuSpar 7.5 mg b.i.d. 5. Elavil 75 mg q.h.s. 6. Lipitor 10 mg q.d. 7. Prempro 0.625 mg q.d. 8. Serevent/Atrovent inhalers as directed.  DISCHARGE INSTRUCTIONS:   The patient was to refrain from any strenuous activity or driving x [id number].  DISCHARGE DIET:  She was to maintain low-fat and low-cholesterol diet.  SPECIAL INSTRUCTIONS:  She was to call our office if there was any bleeding/swelling of the groin.  FOLLOWUP:  The patient was instructed to schedule followup appointments with Drs. Jason Fila and [place] in the ensuing 1-2 weeks.  DISCHARGE DIAGNOSES: 1. Nonobstructive coronary artery disease.    a. Cardiac catheterization on July 08, 1999.    b. Normal left ventricular function.    c. Recent abnormal dobutamine Cardiolite. 2. Chronic obstructive pulmonary disease. 3. Hypertension. 4. Hypercholesterolemia. 5. Gastroesophageal reflux disease. 6. Status post left lobectomy; Lung cancer. DD:  07/09/99 TD:  07/10/99 Job: [id number]

## 2010-08-28 NOTE — Assessment & Plan Note (Signed)
[facility]                              CARDIOLOGY OFFICE NOTE   NAME:HIATTZaiyah, [name]                      MRN:          [id number]  DATE:01/13/2006                            DOB:          [birth year]    PRIMARY CARE PHYSICIAN:  Jarome Matin, M.D.   HISTORY OF PRESENT ILLNESS:  Ms. Keasling is a 65 year old lady with  nonobstructive coronary disease on cardiac catheterization in 2001.  Adenosine cardiolite in January 2005 demonstrated no ischemia with an  ejection fraction of 70%.  She remains without exertional dyspnea or chest  discomfort.   In early 2006 she had three episodes of near syncope.  Holter demonstrated  no tachy or bradyarrhythmias.  She has had no recurrence.   Her principal complaint today is of bilateral leg discomfort both with  exertion and in the evenings when not exerting herself.  She says it  involves primarily her thighs but also her calves and feet.  She has  previously seen Dr. Arbie Cookey for this.  She thinks it was a venous problem but  is not sure.  She wishes not to return to him.   CURRENT MEDICATIONS:  1. Metformin 500 mg per day.  2. Chlor-con 20 mEq daily.  3. Diltiazem CD 240 mg per day.  4. Prilosec 240 mg per day.  5. Gemfibrozil 600 mg twice daily.  6. Triamterene/HCTZ 25/37.5 1 per day.  7. Lidoderm patch.  8. Metoclopramide 10 mg four times daily.  9. Aspirin 325 mg per day.  10.Advair.  11.Gabapentin 100 mg twice daily and then 300 mg in the evening.  12.Celexa 20 mg per day.  13.Xanax 0.25 to 0.5 mg p.r.n.  14.Combivent inhaler.  15.Singulair.  16.Advair.  17.Lasix 20 mg per day.  18.Cozaar 50 mg per day.   PHYSICAL EXAMINATION:  GENERAL:  She is overweight, but otherwise well-  appearing in no distress.  VITAL SIGNS:  Heart rate 68, blood pressure 126/68, weight 194 pounds.  Weight is stable.  She has no jugular venous distention and no thyromegaly.  LUNGS:  Clear to auscultation.  CARDIOVASCULAR:  She has a nondisplaced of point of maximal cardiac impulse.  There is regular rate and rhythm with a 1/6 holosystolic murmur along the  left sternal border and at the apex.  No S3 or S4.  ABDOMEN:  The abdomen is soft, nondistended, nontender, there is no  hepatosplenomegaly.  Bowel sounds are normal.  The extremities were warm and  without edema.  Carotid pulses are 2+ bilaterally without bruit.  Femoral  pulses trace bilaterally.  Dorsalis pedis and posterior tibial pulses not  palpable on either side.   Electrocardiogram demonstrates normal sinus rhythm and is a normal EKG.   IMPRESSION/RECOMMENDATIONS:  1. Leg pain:  The patient wears compression hose presumably for chronic      venous stasis; however, symptoms have substantial exertional component      to them and she has clearly diminished peripheral pulses.  I will      therefore check ankle brachial indices and duplex to assess potential  arterial contribution to her discomfort.  2. Nonobstructive coronary disease:  Continue preventive care.  3. Hypertension:  Nicely controlled.  4. History of lung cancer, status post left upper lobe resection.  5. Hypercholesterolemia:  Managed by Dr. Milinda Antis.  Goal LDL less than 70.  6. Chronic obstructive pulmonary disease:  Managed by Drs. Tower and      [employer].   I will see her back in six weeks.       Salvadore Farber, MD     WED/MedQ  DD:  01/13/2006  DT:  01/14/2006  Job #:  [id number]   cc:   Jarome Matin, M.D.

## 2010-08-28 NOTE — Assessment & Plan Note (Signed)
[facility]                             PULMONARY OFFICE NOTE   NAME:HIATTMaziyah, Vessel                      MRN:          [id number]  DATE:03/30/2006                            DOB:          [birth year]    I saw Ms. Forester today in followup for her sinus disease and COPD.   She says that for the last three weeks she has been having nasal  congestion with postnasal drip and worsening cough.  She says that she  will have occasional yellowish to greenish discharge from her nose as  well as when she coughs.  She denies having any epistaxis.  She also  denies any hemoptysis.  She has been using the Combivent inhaler  recently 4 to 5 times a day.  She does occasionally get some wheezing as  well.  She was started on cefuroxime, but does not feel that this has  helped.  She continues to use her Advair as well as Singulair in  addition to her Combivent.  She is also still using her Astelin nasal  spray on an as-needed basis, but has stopped using her Veramyst.   MEDICATIONS:  1. Klor-Con 20 mEq daily.  2. Diltiazem CD 240 mg daily.  3. Prilosec 20 mg b.i.d.  4. Gemfibrozil 600 mg b.i.d.  5. Triamterene hydrochlorothiazide 25/37.5 one daily.  6. Aspirin 325 mg daily.  7. Advair 250/50 one puff b.i.d.  8. Neurontin 100 mg b.i.d. and 300 mg at night.  9. Xanax 0.5 mg p.r.n.  10.Combivent 2 puffs q.i.d. p.r.n.  11.Singulair 10 mg nightly.  12.Lasix 20 mg daily p.r.n.  13.Cozaar 50 mg daily.  14.Glucophage 250 mg b.i.d.  15.Astelin nasal spray b.i.d.   PHYSICAL EXAMINATION:  She is 195 pounds, temperature is 98.1, blood  pressure 130/68, heart rate is 82, oxygen saturation is 96% on room air.  HEENT:  She has boggy nasal mucosa with yellowish nasal discharge.  She  had tenderness over the maxillary sinuses, has mild erythema of the  posterior pharynx.  There is no lymphadenopathy.  HEART:  With S1, S2.  CHEST:  Clear to auscultation.  ABDOMEN:  Soft,  nontender.  EXTREMITIES:  Reveal no edema.   IMPRESSION:  1. Acute-on-chronic sinus disease.  I will reinitiate her on Veramyst      nasal spray 2 sprays in each nostril once daily.  I would also have      her continue on her Astelin nasal spray 1 to 2 sprays in each      nostril twice daily.  I have also instructed her on the use of      nasal irrigation, and have advised her to use the nasal irrigation      prior to using her nose sprays.  I would discontinue her cefuroxime      and start her on Avelox 400 milligrams qd for 7 days.  2. Chronic obstructive pulmonary disease.  I will have her continue on      her regimen of Advair, Combivent and Singulair.  I think the      worsening  of her symptoms with this are most likely related to her      sinus disease.  I would also have her continue on Mucinex to help      with her cough expectoration.  I have advised her that if her      symptoms do not improve she should call us for an earlier      evaluation.   I would plan on following up with her in approximately 3 months.     Coralyn Helling, MD  Electronically Signed    VS/MedQ  DD: 03/30/2006  DT: 03/31/2006  Job #: [id number]   cc:   Barbette Hair. Artist Pais, DO

## 2010-08-28 NOTE — Cardiovascular Report (Signed)
[place]. [facility]  Patient:    [name], [name]                      MRN: [id number] Adm. Date:  [id number] Disc. Date: [id number] Attending:  Ronaldo Miyamoto CC:         Oley Balm Sung Amabile, M.D. [facility]             Nathen May, M.D., [facility]             Cardiac Catheterization Lab                        Cardiac Catheterization  INDICATIONS:  Ms. Dowe is a pleasant 65 year old white female who presents with a history of exertional shortness of breath.  Numerous studies have been done.  She has had a Cardiolite which has been read as possible anterior ischemia versus breast attenuation.  Because of this, she was referred for further evaluation.  PROCEDURE:  Left heart catheterization, selective coronary angiography, selective left ventriculography.  DESCRIPTION OF PROCEDURE:  The procedure was performed from the right femoral artery.  We used a Smart needle to gain access through an anterior puncture to the right femoral artery because of the depth of the femoral artery.  She tolerated this without complication.  Multiple doses of intravenous Lopressor were given, 5 mg x 3, to lower blood pressure and heart rate.  We were able to bring the heart rate down from 115 to about 78 and the blood pressure to about 160/40.  At the completion of the procedure, she was given one additional dose of 20 mg of labetalol.  There were no complications.  HEMODYNAMIC DATA:  Central aortic pressure was 173/82.  LV pressure was 184/30.  No gradient on pullback across the aortic valve.  ANGIOGRAPHIC DATA:  Left ventriculography in the RAO projection revealed vigorous global systolic function.  Because of ectopy, ejection fraction could not be calculated but clearly was in excess of 65%.  1. The left main coronary artery was short and free of significant disease. 2. The left anterior descending artery coursed to the apex.  Just beyond the    septal perforator,  there is an eccentric pancake-type plaque of about    40-50% which is eccentric.  Multiple views were obtained to highlight this    area.  Distal to this area, there were no critical lesions. 3. The circumflex consisted of one marginal branch which bifurcated.  There    was minimal luminal irregularity but no significant focal disease. 4. The right coronary artery was a dominant vessel.  No high grade stenoses    were noted.  CONCLUSIONS: 1. Normal left ventricular function. 2. A 50% eccentric stenosis of the mid left anterior descending artery. 3. No other high grade coronary lesions.  DISPOSITION:  I have discussed the case with Dr. Graciela Husbands and will review this with him.  We will likely recommend medical therapy at the present time.  She currently is on lipid lowering therapy as well as aspirin.  A consideration will be given to enrolling the patient in the rehabilitation program. DD:  07/08/99 TD:  07/10/99 Job: [id number]

## 2010-09-17 ENCOUNTER — Encounter: Payer: Self-pay | Admitting: Pulmonary Disease

## 2010-09-17 ENCOUNTER — Ambulatory Visit (INDEPENDENT_AMBULATORY_CARE_PROVIDER_SITE_OTHER): Payer: Medicare Other | Admitting: Pulmonary Disease

## 2010-09-17 VITALS — BP 118/64 | HR 86 | Temp 98.1°F | Ht 66.0 in | Wt 187.6 lb

## 2010-09-17 DIAGNOSIS — J449 Chronic obstructive pulmonary disease, unspecified: Secondary | ICD-10-CM

## 2010-09-17 DIAGNOSIS — G4710 Hypersomnia, unspecified: Secondary | ICD-10-CM

## 2010-09-17 DIAGNOSIS — J329 Chronic sinusitis, unspecified: Secondary | ICD-10-CM

## 2010-09-17 DIAGNOSIS — J4489 Other specified chronic obstructive pulmonary disease: Secondary | ICD-10-CM

## 2010-09-17 MED ORDER — PREDNISONE 5 MG PO TABS: ORAL_TABLET | ORAL | Status: AC

## 2010-09-17 NOTE — Patient Instructions (Signed)
Prednisone 5 mg pills: 4 pills for 2 days, 3 pills for 2 days, 2 pills for 2 days, 1 pill for 2 days Follow up in 6 to 8 weeks

## 2010-09-17 NOTE — Progress Notes (Signed)
Subjective:    Patient ID: [name], female    DOB: 22-Jun-1945, 65 y.o.   MRN: [id number]  HPI 65 yo female former smoker with COPD.  Since her last visit she had her hip replacement surgery.    She was recently treated for an acute bronchitis with antibiotics.  She still has cough with clear sputum.  She has some congestion in her sinuses also.  She has not had fever.  She had trouble using spiriva, and feels combivent works better for her.  Past Medical History  Diagnosis Date  . COPD (chronic obstructive pulmonary disease)     gold 2  . Adenocarcinoma, lung     upper left lobe  . Polyneuropathy in diabetes   . Restless legs syndrome (RLS)   . Unspecified sinusitis (chronic)   . Unspecified essential hypertension   . Other and unspecified hyperlipidemia   . Type II or unspecified type diabetes mellitus without mention of complication, not stated as uncontrolled   . Coronary atherosclerosis of unspecified type of vessel, native or graft   . Anemia, unspecified   . GERD (gastroesophageal reflux disease)      Family History  Problem Relation Age of Onset  . Bone cancer Father   . Diabetes Mother      History   Social History  . Marital Status: Divorced    Spouse Name: N/A    Number of Children: N/A  . Years of Education: N/A   Occupational History  . disabled    Social History Main Topics  . Smoking status: Former Smoker -- 1.0 packs/day for 40 years    Quit date: 1Jan 13, 202000  . Smokeless tobacco: Not on file  . Alcohol Use: Not on file  . Drug Use: Not on file  . Sexually Active: Not on file   Other Topics Concern  . Not on file   Social History Narrative  . No narrative on file     Allergies  Allergen Reactions  . Codeine     REACTION: GI upset  . Iohexol   . Penicillins     REACTION: itching     Outpatient Prescriptions Prior to Visit  Medication Sig Dispense Refill  . albuterol (PROAIR HFA) 108 (90 BASE) MCG/ACT inhaler Inhale 2 puffs  into the lungs every 6 (six) hours as needed.        Marland Kitchen albuterol-ipratropium (COMBIVENT) 18-103 MCG/ACT inhaler Inhale 2 puffs into the lungs every 6 (six) hours as needed.        . ALPRAZolam (XANAX) 1 MG tablet Take 1 mg by mouth 3 (three) times daily.        Marland Kitchen aspirin EC 325 MG tablet Take 325 mg by mouth daily.        . citalopram (CELEXA) 40 MG tablet Take 40 mg by mouth daily.        Marland Kitchen diltiazem (CARDIZEM CD) 240 MG 24 hr capsule Take 240 mg by mouth daily.        . Fluticasone-Salmeterol (ADVAIR DISKUS) 250-50 MCG/DOSE AEPB Inhale 1 puff into the lungs every 12 (twelve) hours.        Marland Kitchen gemfibrozil (LOPID) 600 MG tablet Take 600 mg by mouth 2 (two) times daily before a meal.        . HYDROcodone-acetaminophen (VICODIN ES) 7.5-750 MG per tablet Take 1 tablet by mouth every 6 (six) hours as needed.        . lidocaine (LIDODERM) 5 % Place 1-3 patches onto the  skin daily. Remove & Discard patch within 12 hours or as directed by MD       . loratadine (CLARITIN) 10 MG tablet Take 10 mg by mouth daily.        Marland Kitchen losartan (COZAAR) 50 MG tablet Take 50 mg by mouth 2 (two) times daily.        . metFORMIN (GLUCOPHAGE) 500 MG tablet Take 500 mg by mouth daily.        . mometasone (NASONEX) 50 MCG/ACT nasal spray Place 2 sprays into the nose daily.        Marland Kitchen omeprazole (PRILOSEC) 20 MG capsule Take 20 mg by mouth 2 (two) times daily.        . potassium chloride (KLOR-CON) 20 MEQ packet Take 20 mEq by mouth 2 (two) times daily.        . traMADol (ULTRAM) 50 MG tablet Take 50 mg by mouth every 4 (four) hours as needed.        . triamterene-hydrochlorothiazide (MAXZIDE-25) 37.5-25 MG per tablet Take 1 tablet by mouth daily.        . meclizine (ANTIVERT) 25 MG tablet Take 25 mg by mouth daily. 1/2-1 daily.         Review of Systems     Objective:   Physical Exam  BP 118/64  Pulse 86  Temp(Src) 98.1 F (36.7 C) (Oral)  Ht 5\' 6"  (1.676 m)  Wt 187 lb 9.6 oz (85.095 kg)  BMI 30.28 kg/m2  SpO2  96%  General - obese HEENT - clear nasal discharge, no sinus tenderness, no oral exudate, no LAN Cardiac - s1s2 regular, no murmur Chest - faint basilar wheeze clear with cough, scattered rhonchi Abd - soft, nontender Ext - no edema Neuro - normal strength, CN intact     Assessment & Plan:   COPD She has slow to resolve exacerbation.  I don't think she needs additional antibiotics.  Will give her a prednisone taper.  She was not able to tolerate spiriva.  SINUSITIS, CHRONIC NOS Continue her current sinus regimen.  HYPERSOMNIA She had room air ONO from Sept. 2011.  She was scheduled to have sleep test to further evaluate, but did not have her sleep test done yet.  Will allow her to recover further from her acute bronchitis, and then re-assess.    Updated Medication List Outpatient Encounter Prescriptions as of 09/17/2010  Medication Sig Dispense Refill  . albuterol (PROAIR HFA) 108 (90 BASE) MCG/ACT inhaler Inhale 2 puffs into the lungs every 6 (six) hours as needed.        Marland Kitchen albuterol-ipratropium (COMBIVENT) 18-103 MCG/ACT inhaler Inhale 2 puffs into the lungs every 6 (six) hours as needed.        . ALPRAZolam (XANAX) 1 MG tablet Take 1 mg by mouth 3 (three) times daily.        Marland Kitchen aspirin EC 325 MG tablet Take 325 mg by mouth daily.        . citalopram (CELEXA) 40 MG tablet Take 40 mg by mouth daily.        . cyclobenzaprine (FLEXERIL) 10 MG tablet Take 10 mg by mouth 2 (two) times daily as needed.        . diltiazem (CARDIZEM CD) 240 MG 24 hr capsule Take 240 mg by mouth daily.        . Fluticasone-Salmeterol (ADVAIR DISKUS) 250-50 MCG/DOSE AEPB Inhale 1 puff into the lungs every 12 (twelve) hours.        Marland Kitchen  gemfibrozil (LOPID) 600 MG tablet Take 600 mg by mouth 2 (two) times daily before a meal.        . HYDROcodone-acetaminophen (VICODIN ES) 7.5-750 MG per tablet Take 1 tablet by mouth every 6 (six) hours as needed.        . lidocaine (LIDODERM) 5 % Place 1-3 patches onto the skin  daily. Remove & Discard patch within 12 hours or as directed by MD       . loratadine (CLARITIN) 10 MG tablet Take 10 mg by mouth daily.        Marland Kitchen losartan (COZAAR) 50 MG tablet Take 50 mg by mouth 2 (two) times daily.        . metFORMIN (GLUCOPHAGE) 500 MG tablet Take 500 mg by mouth daily.        . mometasone (NASONEX) 50 MCG/ACT nasal spray Place 2 sprays into the nose daily.        Marland Kitchen omeprazole (PRILOSEC) 20 MG capsule Take 20 mg by mouth 2 (two) times daily.        . potassium chloride (KLOR-CON) 20 MEQ packet Take 20 mEq by mouth 2 (two) times daily.        . traMADol (ULTRAM) 50 MG tablet Take 50 mg by mouth every 4 (four) hours as needed.        . triamterene-hydrochlorothiazide (MAXZIDE-25) 37.5-25 MG per tablet Take 1 tablet by mouth daily.        . predniSONE (DELTASONE) 5 MG tablet 4 pills for 2 days, 3 pills for 2 days, 2 pills for 2 days, 1 pill for 2 days  20 tablet  0  . DISCONTD: meclizine (ANTIVERT) 25 MG tablet Take 25 mg by mouth daily. 1/2-1 daily.

## 2010-09-22 NOTE — Assessment & Plan Note (Signed)
Continue her current sinus regimen.

## 2010-09-22 NOTE — Assessment & Plan Note (Signed)
She has slow to resolve exacerbation.  I don't think she needs additional antibiotics.  Will give her a prednisone taper.  She was not able to tolerate spiriva.

## 2010-09-22 NOTE — Assessment & Plan Note (Signed)
She had room air ONO from Sept. 2011.  She was scheduled to have sleep test to further evaluate, but did not have her sleep test done yet.  Will allow her to recover further from her acute bronchitis, and then re-assess.

## 2010-09-30 ENCOUNTER — Encounter (INDEPENDENT_AMBULATORY_CARE_PROVIDER_SITE_OTHER): Payer: Medicare Other

## 2010-09-30 DIAGNOSIS — M79609 Pain in unspecified limb: Secondary | ICD-10-CM

## 2010-11-09 ENCOUNTER — Ambulatory Visit: Payer: Medicare Other | Admitting: Pulmonary Disease

## 2010-11-26 ENCOUNTER — Ambulatory Visit (INDEPENDENT_AMBULATORY_CARE_PROVIDER_SITE_OTHER)
Admission: RE | Admit: 2010-11-26 | Discharge: 2010-11-26 | Disposition: A | Discharge status: Home / Self Care | Payer: Medicare Other | Source: Ambulatory Visit | Attending: Pulmonary Disease | Admitting: Pulmonary Disease

## 2010-11-26 ENCOUNTER — Encounter: Payer: Self-pay | Admitting: Pulmonary Disease

## 2010-11-26 ENCOUNTER — Ambulatory Visit (INDEPENDENT_AMBULATORY_CARE_PROVIDER_SITE_OTHER): Payer: Medicare Other | Admitting: Pulmonary Disease

## 2010-11-26 VITALS — BP 142/74 | HR 85 | Temp 98.3°F | Ht 66.0 in | Wt 188.4 lb

## 2010-11-26 DIAGNOSIS — J329 Chronic sinusitis, unspecified: Secondary | ICD-10-CM

## 2010-11-26 DIAGNOSIS — J449 Chronic obstructive pulmonary disease, unspecified: Secondary | ICD-10-CM

## 2010-11-26 DIAGNOSIS — G4710 Hypersomnia, unspecified: Secondary | ICD-10-CM

## 2010-11-26 MED ORDER — MOMETASONE FUROATE 50 MCG/ACT NA SUSP: 2.0000 | Freq: Every day | NASAL | Status: DC

## 2010-11-26 MED ORDER — MOMETASONE FURO-FORMOTEROL FUM 200-5 MCG/ACT IN AERO: 2.0000 | INHALATION_SPRAY | Freq: Two times a day (BID) | RESPIRATORY_TRACT | Status: DC

## 2010-11-26 NOTE — Progress Notes (Signed)
Subjective:    Patient ID: [name], female    DOB: [birth year], 65 y.o.   MRN: [id number]  HPI 65 yo female former smoker with GOLD 2 COPD.  She doesn't feel like she every full recovered from her last exacerbation in June.  She felt better with prednisone.  She has continued daily cough with white to beige sputum.  She gets chest tightness and wheezing.  She has to use combivent every 4 hours.  She has trouble sleeping at night, and wakes up with a choking sensation.  She has sinus congestion, and feels like her ears are filled up.  She denies abdominal pain, leg swelling, gland swelling, or hemoptysis.  She has felt feverish on occasion and has noticed a sore throat.  She has not had difficulty swallowing.  Past Medical History  Diagnosis Date  . COPD (chronic obstructive pulmonary disease)     gold 2  . Adenocarcinoma, lung     upper left lobe  . Polyneuropathy in diabetes   . Restless legs syndrome (RLS)   . Unspecified sinusitis (chronic)   . Unspecified essential hypertension   . Other and unspecified hyperlipidemia   . Type II or unspecified type diabetes mellitus without mention of complication, not stated as uncontrolled   . Coronary atherosclerosis of unspecified type of vessel, native or graft   . Anemia, unspecified   . GERD (gastroesophageal reflux disease)     Review of Systems     Objective:   Physical Exam  BP 142/74  Pulse 85  Temp(Src) 98.3 F (36.8 C) (Oral)  Ht 5\' 6"  (1.676 m)  Wt 188 lb 6.4 oz (85.458 kg)  BMI 30.41 kg/m2  SpO2 96%  General - obese  HEENT - clear nasal discharge, no sinus tenderness, no oral exudate, no LAN, TM clear Cardiac - s1s2 regular, no murmur  Chest - prolonged exhalation, decreased breath sounds, no wheeze/rales/dullness Abd - soft, nontender  Ext - no edema  Neuro - normal strength, CN intact Psych - normal mood/behavior  Spirometry 11/26/10>>FEV1 1.91(77%), FEV1% 68  CHEST - 2 VIEW 11/26/10:  Comparison:  Chest x-ray 12/08/2009.   Findings: The cardiac silhouette, mediastinal and hilar contours are stable. Stable surgical changes involving the left hilum.  Stable underlying changes of emphysema. No acute pulmonary findings.  No pleural effusion. The bony thorax is intact.   IMPRESSION:  1. Stable surgical changes in the left hilar region.  2. Stable emphysematous changes.  3. No acute pulmonary findings      Assessment & Plan:   COPD Her spirometry is essential the same, and chest xray was unremarkable today.  Will change to dulera 200/5, and stop advair.  SINUSITIS, CHRONIC NOS Much of her current symptoms are likely related to post-nasal drip.  Will have her continue nasonex.  HYPERSOMNIA She had room air ONO from Sept. 2011.  She is reluctant to have sleep testing at this time.  I explained that some of her nocturnal symptoms may be related to sleep apnea.      Updated Medication List Outpatient Encounter Prescriptions as of 11/26/2010  Medication Sig Dispense Refill  . albuterol (PROAIR HFA) 108 (90 BASE) MCG/ACT inhaler Inhale 2 puffs into the lungs every 6 (six) hours as needed.        Marland Kitchen albuterol-ipratropium (COMBIVENT) 18-103 MCG/ACT inhaler Inhale 2 puffs into the lungs every 6 (six) hours as needed.        . ALPRAZolam (XANAX) 1 MG tablet Take 1 mg  by mouth 3 (three) times daily.        Marland Kitchen aspirin EC 325 MG tablet Take 325 mg by mouth daily.        . citalopram (CELEXA) 40 MG tablet Take 40 mg by mouth daily.        . cyclobenzaprine (FLEXERIL) 10 MG tablet Take 10 mg by mouth 2 (two) times daily as needed.        . diltiazem (CARDIZEM CD) 240 MG 24 hr capsule Take 240 mg by mouth daily.        Marland Kitchen gemfibrozil (LOPID) 600 MG tablet Take 600 mg by mouth 2 (two) times daily before a meal.        . HYDROcodone-acetaminophen (VICODIN ES) 7.5-750 MG per tablet Take 1 tablet by mouth every 6 (six) hours as needed.        . lidocaine (LIDODERM) 5 % Place 1-3 patches onto the  skin daily. Remove & Discard patch within 12 hours or as directed by MD       . losartan (COZAAR) 50 MG tablet Take 50 mg by mouth 2 (two) times daily.        . metFORMIN (GLUCOPHAGE) 500 MG tablet Take 500 mg by mouth daily.        . mometasone (NASONEX) 50 MCG/ACT nasal spray Place 2 sprays into the nose daily.  17 g  5  . omeprazole (PRILOSEC) 20 MG capsule Take 20 mg by mouth 2 (two) times daily.        . potassium chloride (KLOR-CON) 20 MEQ packet Take 20 mEq by mouth 2 (two) times daily.        . pseudoephedrine-guaifenesin (MUCINEX D) 60-600 MG per tablet Take 1 tablet by mouth every 12 (twelve) hours.        . traMADol (ULTRAM) 50 MG tablet Take 50 mg by mouth every 4 (four) hours as needed.        . triamterene-hydrochlorothiazide (MAXZIDE-25) 37.5-25 MG per tablet Take 1 tablet by mouth daily.        Marland Kitchen DISCONTD: Fluticasone-Salmeterol (ADVAIR DISKUS) 250-50 MCG/DOSE AEPB Inhale 1 puff into the lungs every 12 (twelve) hours.        Marland Kitchen DISCONTD: mometasone (NASONEX) 50 MCG/ACT nasal spray Place 2 sprays into the nose daily.        . Mometasone Furo-Formoterol Fum (DULERA) 200-5 MCG/ACT AERO Inhale 2 puffs into the lungs 2 (two) times daily.  1 Inhaler  5  . DISCONTD: loratadine (CLARITIN) 10 MG tablet Take 10 mg by mouth daily.

## 2010-11-26 NOTE — Patient Instructions (Signed)
Stop advair Dulera two puffs twice per day, and rinse mouth after using Nasonex two sprays daily Follow up in 3 months

## 2010-11-26 NOTE — Assessment & Plan Note (Signed)
Her spirometry is essential the same, and chest xray was unremarkable today.  Will change to dulera 200/5, and stop advair.

## 2010-11-26 NOTE — Assessment & Plan Note (Signed)
Much of her current symptoms are likely related to post-nasal drip.  Will have her continue nasonex.

## 2010-11-26 NOTE — Assessment & Plan Note (Signed)
She had room air ONO from Sept. 2011.  She is reluctant to have sleep testing at this time.  I explained that some of her nocturnal symptoms may be related to sleep apnea.

## 2010-12-07 ENCOUNTER — Telehealth: Payer: Self-pay | Admitting: Pulmonary Disease

## 2010-12-07 NOTE — Telephone Encounter (Signed)
Spoke with pt. She states breathing is no better since last seen. She states that she wakes up in the night with SOB and also has felt like she may have fever, but is unsure. I advised VS out of the office this wk and will need to come in. I have sched her to see PW at 2:45 pm tomorrow and advised that she seek emergency care in the meantime if needed. Pt verbalized understanding.

## 2010-12-08 ENCOUNTER — Ambulatory Visit: Payer: Medicare Other | Admitting: Critical Care Medicine

## 2010-12-08 ENCOUNTER — Telehealth: Payer: Self-pay | Admitting: Critical Care Medicine

## 2010-12-08 MED ORDER — PREDNISONE 10 MG PO TABS: ORAL_TABLET | ORAL | Status: DC

## 2010-12-08 MED ORDER — AMOXICILLIN-POT CLAVULANATE 875-125 MG PO TABS: 1.0000 | ORAL_TABLET | Freq: Two times a day (BID) | ORAL | Status: AC

## 2010-12-08 NOTE — Telephone Encounter (Signed)
Pt aware rx's was sent to pharmacy and nothing further was needed. Pt also aware to contact us or seek emergency care if she gets worse. Pt verbalized understanding

## 2010-12-08 NOTE — Telephone Encounter (Signed)
Call in augmentin twice daily 875mg  x 7days Prednisone 10mg  Take 4 for three days, 3 for three days, 2 for three days, 1 for three days and stop   #30

## 2010-12-08 NOTE — Telephone Encounter (Signed)
Spoke with pt and she states she knows she has bronchitis. Pt cancelled her OV today with Dr. Delford Field b/c she states she can't even get dressed. Pt states she is having a lot of congestion and wants something to help break the mucus up. Pt states she has been taking cough syrup that contains guaifenesin and she says that has helped a lot. Pt state she is also having sweats. Pt states her aid is coming to her house around 4 to get her vital signs. Pt is requesting to have something called in. Will send to the on call doctor since Dr. Craige Cotta is out of the office. Please advise Dr. Kriste Basque. Thanks  Allergies  Allergen Reactions  . Codeine     REACTION: GI upset  . Iohexol   . Penicillins     REACTION: itching     Carver Fila, CMA

## 2010-12-16 ENCOUNTER — Telehealth: Payer: Self-pay | Admitting: Critical Care Medicine

## 2010-12-16 NOTE — Telephone Encounter (Signed)
I spoke with the pt and she was given a course of augmentin 875 twice daily x 7 days and pred taper 10mg  4 x 3 etc.  She states she just finished the abx today, and she is on day 4 of the prednisone. She states she started the pred later because  she was so sick. I advised the pt that she needs an appt to be evaluated. She states she is too sick to come in. I advised if she is too sick to come in for OV then she needs to go to ER. Pt states she does not want to go to ER. I again advised that sicen the medications are not helping she needs to come in for OV. The pt then states that the meds are helping. I advised since she just finished the abx and has not been taking the prednisone for long to continue the prednisone for few more days and call if not improved for an office visit. Pt states understanding. Carron Curie, CMA

## 2010-12-25 ENCOUNTER — Ambulatory Visit: Payer: Medicare Other | Admitting: Cardiovascular Disease

## 2010-12-31 ENCOUNTER — Telehealth: Payer: Self-pay | Admitting: Cardiovascular Disease

## 2010-12-31 NOTE — Telephone Encounter (Signed)
9/20--pt calling stating she cancelled her appoint on 9/14 as she thought it was her lungs causing her SOB and not her heart--she has been thru 3 rounds of antibiotics with no change in her condition so now she thinks it may be her heart--pt wants an appoint with dr cooper--remarkably dr cooper has an opening 9/21 at 3:30PM AND PT WILL TAKE THAT PLACE--NT

## 2010-12-31 NOTE — Telephone Encounter (Signed)
Pt went to pulmonary physician and she thinks it is her heart instead of lungs. She said her back was hurting yesterday. Please call

## 2011-01-01 ENCOUNTER — Ambulatory Visit (INDEPENDENT_AMBULATORY_CARE_PROVIDER_SITE_OTHER): Payer: Medicare Other | Admitting: Cardiovascular Disease

## 2011-01-01 ENCOUNTER — Encounter: Payer: Self-pay | Admitting: Cardiovascular Disease

## 2011-01-01 DIAGNOSIS — I739 Peripheral vascular disease, unspecified: Secondary | ICD-10-CM

## 2011-01-01 DIAGNOSIS — I2510 Atherosclerotic heart disease of native coronary artery without angina pectoris: Secondary | ICD-10-CM

## 2011-01-01 DIAGNOSIS — R0989 Other specified symptoms and signs involving the circulatory and respiratory systems: Secondary | ICD-10-CM

## 2011-01-01 DIAGNOSIS — E785 Hyperlipidemia, unspecified: Secondary | ICD-10-CM

## 2011-01-01 DIAGNOSIS — R079 Chest pain, unspecified: Secondary | ICD-10-CM

## 2011-01-01 DIAGNOSIS — R0609 Other forms of dyspnea: Secondary | ICD-10-CM

## 2011-01-01 NOTE — Patient Instructions (Signed)
Your physician wants you to follow-up in: 1 YEAR.  You will receive a reminder letter in the mail two months in advance. If you don't receive a letter, please call our office to schedule the follow-up appointment.  Your physician recommends that you continue on your current medications as directed. Please refer to the Current Medication list given to you today.  Your physician has requested that you have an echocardiogram. Echocardiography is a painless test that uses sound waves to create images of your heart. It provides your doctor with information about the size and shape of your heart and how well your heart's chambers and valves are working. This procedure takes approximately one hour. There are no restrictions for this procedure.  Your physician has requested that you have a lexiscan myoview. For further information please visit [email or web address] Please follow instruction sheet, as given.

## 2011-01-01 NOTE — Assessment & Plan Note (Signed)
The patient's records were reviewed her last cardiac catheterization was in 2006. At that time she had 30-50% stenosis of the LAD. There was no high-grade obstructive disease visualized. She did have an episode as described above of severe chest pressure. I think it is reasonable to evaluate her for ischemia. A Lexiscan Myoview stress test will be ordered. In the setting of progressive dyspnea and edema we will also check a 2-D echocardiogram to rule out LV dysfunction or development of diastolic abnormalities.

## 2011-01-01 NOTE — Assessment & Plan Note (Signed)
The patient has no typical claudication symptoms. She has moderate iliofemoral atherosclerosis is evaluated by CT angiography. No symptoms of progressive claudication. She is followed by Dr. Darrick Penna.

## 2011-01-01 NOTE — Assessment & Plan Note (Signed)
Lipids are at goal with a cholesterol of 145, HDL of 54, and LDL of 73.

## 2011-01-01 NOTE — Progress Notes (Signed)
HPI:  This is a 89 her old woman with COPD, nonobstructive coronary artery disease, and peripheral arterial disease presenting for followup evaluation the patient complains of progressive dyspnea, cough, and nasal congestion over the last few months. She has been treated with cycles of antibiotics without much improvement. She's been concerned that some of her symptoms are cardiac related as she has not improved much with standard treatment for her COPD.  The patient complains of an episode where she experienced severe chest pressure and shortness of breath when she was walking through [employer]. She states the chest pressure felt like "an elephant sitting on my chest and back."This resolved after several minutes of rest. She's had no other episodes of chest pain, but does complain of orthopnea and worsening dyspnea. She complains of mild leg edema.  Outpatient Encounter Prescriptions as of 01/01/2011  Medication Sig Dispense Refill  . albuterol (PROAIR HFA) 108 (90 BASE) MCG/ACT inhaler Inhale 2 puffs into the lungs every 6 (six) hours as needed.        Marland Kitchen albuterol-ipratropium (COMBIVENT) 18-103 MCG/ACT inhaler Inhale 2 puffs into the lungs every 6 (six) hours as needed.        . ALPRAZolam (XANAX) 1 MG tablet Take 1 mg by mouth 3 (three) times daily.        Marland Kitchen amoxicillin-clavulanate (AUGMENTIN) 875-125 MG per tablet Take 1 tablet by mouth 2 (two) times daily.        Marland Kitchen aspirin EC 325 MG tablet Take 325 mg by mouth daily.        Marland Kitchen diltiazem (CARDIZEM CD) 240 MG 24 hr capsule Take 240 mg by mouth daily.        . Fluticasone-Salmeterol (ADVAIR DISKUS) 250-50 MCG/DOSE AEPB Inhale 1 puff into the lungs every 12 (twelve) hours.        Marland Kitchen gemfibrozil (LOPID) 600 MG tablet Take 600 mg by mouth 2 (two) times daily before a meal.        . HYDROcodone-acetaminophen (VICODIN ES) 7.5-750 MG per tablet Take 1 tablet by mouth every 6 (six) hours as needed.        . lidocaine (LIDODERM) 5 % Place 1-3 patches onto the  skin daily. Remove & Discard patch within 12 hours or as directed by MD       . losartan (COZAAR) 50 MG tablet Take 50 mg by mouth 2 (two) times daily.        . metFORMIN (GLUCOPHAGE) 500 MG tablet Take 500 mg by mouth 2 (two) times daily with a meal.       . omeprazole (PRILOSEC) 20 MG capsule Take 20 mg by mouth 2 (two) times daily.        . potassium chloride (KLOR-CON) 20 MEQ packet Take 20 mEq by mouth 2 (two) times daily.        . pseudoephedrine-guaifenesin (MUCINEX D) 60-600 MG per tablet Take 1 tablet by mouth every 12 (twelve) hours.        . traMADol (ULTRAM) 50 MG tablet Take 50 mg by mouth every 4 (four) hours as needed.        Marland Kitchen DISCONTD: mometasone (NASONEX) 50 MCG/ACT nasal spray Place 2 sprays into the nose daily.  17 g  5  . DISCONTD: triamterene-hydrochlorothiazide (MAXZIDE-25) 37.5-25 MG per tablet Take 1 tablet by mouth daily.        Marland Kitchen DISCONTD: citalopram (CELEXA) 40 MG tablet Take 40 mg by mouth daily.        Marland Kitchen DISCONTD: cyclobenzaprine (  FLEXERIL) 10 MG tablet Take 10 mg by mouth 2 (two) times daily as needed.        Marland Kitchen DISCONTD: Mometasone Furo-Formoterol Fum (DULERA) 200-5 MCG/ACT AERO Inhale 2 puffs into the lungs 2 (two) times daily.  1 Inhaler  5  . DISCONTD: predniSONE (DELTASONE) 10 MG tablet Take 4 tablets x 3 days, 3 tablets x 3 days, 2 tablets x 3 days, 1 tablet x 3 days  30 tablet  0    Allergies  Allergen Reactions  . Codeine     REACTION: GI upset  . Iohexol   . Penicillins     REACTION: itching    Past Medical History  Diagnosis Date  . COPD (chronic obstructive pulmonary disease)     gold 2  . Adenocarcinoma, lung     upper left lobe  . Polyneuropathy in diabetes   . Restless legs syndrome (RLS)   . Unspecified sinusitis (chronic)   . Unspecified essential hypertension   . Other and unspecified hyperlipidemia   . Type II or unspecified type diabetes mellitus without mention of complication, not stated as uncontrolled   . Coronary  atherosclerosis of unspecified type of vessel, native or graft   . Anemia, unspecified   . GERD (gastroesophageal reflux disease)     ROS: Negative except as per HPI  BP 160/60  Pulse 85  Resp 18  Ht 5\' 6"  (1.676 m)  Wt 187 lb 12.8 oz (85.186 kg)  BMI 30.31 kg/m2  PHYSICAL EXAM: Pt is alert and oriented, NAD HEENT: normal Neck: JVP - normal, carotids 2+= without bruits Lungs: CTA bilaterally CV: RRR without murmur or gallop Abd: soft, NT, Positive BS, no hepatomegaly Ext: no C/C/E, distal pulses intact and equal Skin: warm/dry no rash  EKG:  Normal sinus rhythm with nonspecific ST and T wave abnormality, mildly prolonged QT, heart rate 85 beats per minute  ASSESSMENT AND PLAN:

## 2011-01-14 ENCOUNTER — Ambulatory Visit (HOSPITAL_COMMUNITY): Payer: Medicare Other | Attending: Cardiovascular Disease | Admitting: Radiology

## 2011-01-14 DIAGNOSIS — I2510 Atherosclerotic heart disease of native coronary artery without angina pectoris: Secondary | ICD-10-CM | POA: Insufficient documentation

## 2011-01-14 DIAGNOSIS — J449 Chronic obstructive pulmonary disease, unspecified: Secondary | ICD-10-CM | POA: Insufficient documentation

## 2011-01-14 DIAGNOSIS — Z87891 Personal history of nicotine dependence: Secondary | ICD-10-CM | POA: Insufficient documentation

## 2011-01-14 DIAGNOSIS — R0602 Shortness of breath: Secondary | ICD-10-CM

## 2011-01-14 DIAGNOSIS — J4489 Other specified chronic obstructive pulmonary disease: Secondary | ICD-10-CM | POA: Insufficient documentation

## 2011-01-14 DIAGNOSIS — R0609 Other forms of dyspnea: Secondary | ICD-10-CM

## 2011-01-14 DIAGNOSIS — I080 Rheumatic disorders of both mitral and aortic valves: Secondary | ICD-10-CM | POA: Insufficient documentation

## 2011-01-14 DIAGNOSIS — R0789 Other chest pain: Secondary | ICD-10-CM

## 2011-01-14 DIAGNOSIS — E785 Hyperlipidemia, unspecified: Secondary | ICD-10-CM | POA: Insufficient documentation

## 2011-01-14 DIAGNOSIS — R609 Edema, unspecified: Secondary | ICD-10-CM | POA: Insufficient documentation

## 2011-01-14 DIAGNOSIS — I079 Rheumatic tricuspid valve disease, unspecified: Secondary | ICD-10-CM | POA: Insufficient documentation

## 2011-01-14 DIAGNOSIS — R0989 Other specified symptoms and signs involving the circulatory and respiratory systems: Secondary | ICD-10-CM

## 2011-01-14 DIAGNOSIS — E119 Type 2 diabetes mellitus without complications: Secondary | ICD-10-CM | POA: Insufficient documentation

## 2011-01-14 DIAGNOSIS — R079 Chest pain, unspecified: Secondary | ICD-10-CM | POA: Insufficient documentation

## 2011-01-14 DIAGNOSIS — E669 Obesity, unspecified: Secondary | ICD-10-CM | POA: Insufficient documentation

## 2011-01-14 DIAGNOSIS — R5381 Other malaise: Secondary | ICD-10-CM | POA: Insufficient documentation

## 2011-01-14 DIAGNOSIS — R5383 Other fatigue: Secondary | ICD-10-CM | POA: Insufficient documentation

## 2011-01-14 MED ORDER — TECHNETIUM TC 99M TETROFOSMIN IV KIT
33.0000 | PACK | Freq: Once | INTRAVENOUS | Status: AC | PRN
  Administered 2011-01-14: 33 via INTRAVENOUS

## 2011-01-14 MED ORDER — TECHNETIUM TC 99M TETROFOSMIN IV KIT
11.0000 | PACK | Freq: Once | INTRAVENOUS | Status: AC | PRN
  Administered 2011-01-14: 11 via INTRAVENOUS

## 2011-01-14 MED ORDER — REGADENOSON 0.4 MG/5ML IV SOLN
0.4000 mg | Freq: Once | INTRAVENOUS | Status: AC
  Administered 2011-01-14: 0.4 mg via INTRAVENOUS

## 2011-01-14 NOTE — Progress Notes (Signed)
[facility] SITE 3 NUCLEAR MED [address] [place] [address] [phone]  Cardiology Nuclear Med Study  [name] is a 65 y.o. female [id number] 1946/04/09   Nuclear Med Background Indication for Stress Test:  Evaluation for Ischemia and Abnormal EKG History:  COPD, '06 Echo:EF 65% NL, '06 Heart Catheterization: EF 30-50% LAD and '08 Myocardial Perfusion Study: EF 84% NL  Cardiac Risk Factors: History of Smoking, Hypertension, Lipids, NIDDM and PVD  Symptoms:  Chest Pressure, DOE and SOB   Nuclear Pre-Procedure Caffeine/Decaff Intake:  None NPO After: 8:00am   Lungs:  clear IV 0.9% NS with Angio Cath:  22g  IV Site: R Forearm  IV Started by:  Stanton Kidney, EMT-P  Chest Size (in):  42 Cup Size: D  Height: 5\' 6"  (1.676 m)  Weight:  187 lb (84.823 kg)  BMI:  Body mass index is 30.18 kg/(m^2). Tech Comments:  NA    Nuclear Med Study 1 or 2 day study: 1 day  Stress Test Type:  Eugenie Birks  Reading MD: Olga Millers, MD  Order Authorizing Provider:  M.Cooper  Resting Radionuclide: Technetium 79m Tetrofosmin  Resting Radionuclide Dose: 11.0 mCi   Stress Radionuclide:  Technetium 42m Tetrofosmin  Stress Radionuclide Dose: 33.0 mCi           Stress Protocol Rest HR: 71 Stress HR: 83  Rest BP: 132/43 Stress BP: 140/47  Exercise Time (min): n/a METS: n/a   Predicted Max HR: 154 bpm % Max HR: 53.9 bpm Rate Pressure Product: 21308   Dose of Adenosine (mg):  n/a Dose of Lexiscan: 0.4 mg  Dose of Atropine (mg): n/a Dose of Dobutamine: n/a mcg/kg/min (at max HR)  Stress Test Technologist: Milana Na, EMT-P  Nuclear Technologist:  Domenic Polite, CNMT     Rest Procedure:  Myocardial perfusion imaging was performed at rest 45 minutes following the intravenous administration of Technetium 33m Tetrofosmin. Rest ECG: NSR  Stress Procedure:  The patient received IV Lexiscan 0.4 mg over 15-seconds.  Technetium 41m Tetrofosmin injected at 30-seconds.  The patient had sob,weakness, and chest heaviness with Lexiscan. There were no significant changes with Lexiscan.  Quantitative spect images were obtained after a 45 minute delay. Stress ECG: No significant ST segment change suggestive of ischemia.  QPS Raw Data Images:  Acquisition technically good; normal left ventricular size. Stress Images:  There is decreased uptake in the anterior wall. Rest Images:  There is decreased uptake in the anterior wall, slightly less prominent compared to the stress images. Subtraction (SDS):  Small anterior defect with minimal reversibility (felt not to be clinically significant) most likely related to breast attenuation and no ischemia. Transient Ischemic Dilatation (Normal <1.22):  1.05 Lung/Heart Ratio (Normal <0.45):  0.23  Quantitative Gated Spect Images QGS EDV:  57 ml QGS ESV:  6 ml QGS cine images:  NL LV Function; NL Wall Motion QGS EF: 89%  Impression Exercise Capacity:  Lexiscan with no exercise. BP Response:  Normal blood pressure response. Clinical Symptoms:  There is chest pain. ECG Impression:  No significant ST segment change suggestive of ischemia. Comparison with Prior Nuclear Study: No images to compare  Overall Impression:  Probably normal stress nuclear study with a small fixed anterior defect suggestive of breast attenuation but no ischemia.    Olga Millers

## 2011-01-15 ENCOUNTER — Telehealth: Payer: Self-pay | Admitting: Cardiovascular Disease

## 2011-01-15 ENCOUNTER — Telehealth: Payer: Self-pay | Admitting: Pulmonary Disease

## 2011-01-15 NOTE — Telephone Encounter (Signed)
I spoke with pt and she states she has had some increase SOB x couple weeks. Pt states she has started to coughing now. Pt states she wanted an OV on Monday. Pt is scheduled to see RA at 4:15. Pt aware in the mean time if something would change to seek ED or urgent care in the meantime. Nothing further was needed

## 2011-01-15 NOTE — Telephone Encounter (Signed)
I spoke the pt and made her aware of echo and myoview results.  The pt plans on making a follow-up appointment with her pulmonary MD for SOB.

## 2011-01-15 NOTE — Telephone Encounter (Signed)
Pt is calling about tests results

## 2011-01-18 ENCOUNTER — Encounter: Payer: Self-pay | Admitting: Pulmonary Disease

## 2011-01-18 ENCOUNTER — Ambulatory Visit (INDEPENDENT_AMBULATORY_CARE_PROVIDER_SITE_OTHER): Payer: Medicare Other | Admitting: Pulmonary Disease

## 2011-01-18 VITALS — BP 140/40 | HR 76 | Temp 98.6°F | Wt 192.6 lb

## 2011-01-18 DIAGNOSIS — J449 Chronic obstructive pulmonary disease, unspecified: Secondary | ICD-10-CM

## 2011-01-18 NOTE — Patient Instructions (Signed)
Trial of symbicort 160 2 puffs twice daily Increase mucinex 1200 daily I would recommend a rehab program Stay on the anxiety pill

## 2011-01-18 NOTE — Progress Notes (Signed)
  Subjective:    Patient ID: [name], female    DOB: 05-11-1945, 65 y.o.   MRN: [id number]  HPI 65 yo female former smoker with GOLD 2 COPD, PAD & non obstructive CAD.  She doesn't feel like she every full recovered from her last exacerbation in June. She felt better with prednisone. She has continued daily cough with white to beige sputum. She gets chest tightness and wheezing. She has to use combivent every 4 hours. She has trouble sleeping at night, and wakes up with a choking sensation. She has sinus congestion, and feels like her ears are filled up.  >> advair changed to dulera  01/18/2011 VS pt. Increased SOB all the time, productive cough with white mucus, wheezing, chest tightness. Stress test on Friday advised same normal, echo - mild pericardial effusion ? significance Episode of dyspnea in grocery store Voice hoarse Dulera does not last, back on advair,  spiriva did not help Anxiety is not worse, & this does not feel like panic attack  Review of Systems Pt denies any significant  nasal congestion or excess secretions, fever, chills, sweats, unintended wt loss, pleuritic or exertional cp, orthopnea pnd or leg swelling.  Pt also denies any obvious fluctuation in symptoms with weather or environmental change or other alleviating or aggravating factors.    Pt denies any increase in rescue therapy over baseline, denies waking up needing it or having early am exacerbations or coughing/wheezing/ or dyspnea      Objective:   Physical Exam Gen. Pleasant, obese, in no distress ENT - no lesions, no post nasal drip Neck: No JVD, no thyromegaly, no carotid bruits Lungs: no use of accessory muscles, no dullness to percussion, decreased without rales or rhonchi  Cardiovascular: Rhythm regular, heart sounds  normal, no murmurs or gallops, no peripheral edema Musculoskeletal: No deformities, no cyanosis or clubbing , no tremors        Assessment & Plan:

## 2011-01-18 NOTE — Assessment & Plan Note (Signed)
Spirometry 11/26/10>>FEV1 1.91(77%), FEV1% 68 Dyspnea is out of proportion to lung function -  Unable to tolerate spiriva. Hoarseness may be due to advair - will trial symbicort - since she prefers to juggle medications around I offered her pulm rehab but she does not want to pursue this Doubt oral steroids needed  Defer significance of pericardial effusion to cards.

## 2011-01-19 ENCOUNTER — Telehealth: Payer: Self-pay | Admitting: Cardiovascular Disease

## 2011-01-19 DIAGNOSIS — R9430 Abnormal result of cardiovascular function study, unspecified: Secondary | ICD-10-CM

## 2011-01-19 NOTE — Telephone Encounter (Addendum)
Pt calling back re results of echo, pls call pt not feeling well

## 2011-01-19 NOTE — Telephone Encounter (Signed)
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  Dr Excell Seltzer will review this information to make sure the pt can have MRI.

## 2011-01-19 NOTE — Telephone Encounter (Signed)
I spoke with the pt and made her aware of final results of echo.  Order placed for Cardiac MRI.  The pt would like this test done ASAP.  The pt will need a BMP prior to procedure. The pt has an allergy to contrast and she takes Metformin.

## 2011-01-19 NOTE — Telephone Encounter (Signed)
Pt calling re results of echo 

## 2011-01-19 NOTE — Progress Notes (Signed)
Reviewed

## 2011-01-20 NOTE — Telephone Encounter (Signed)
Cardiac MRI scheduled for 01/27/11 at 11:00.  Pt is not aware that test is scheduled at this time.  Awaiting review by Dr Excell Seltzer to see if pt can have test due to hip surgery.

## 2011-01-21 ENCOUNTER — Telehealth: Payer: Self-pay | Admitting: Cardiovascular Disease

## 2011-01-21 NOTE — Telephone Encounter (Signed)
Called wanting to give [name] correct orthopedic Dr.'s name. States it is Dr. Gean Birchwood not Dr. Darrick Penna. Advised that was in the notes as Dr. Turner Daniels and Leotis Shames was waiting on Dr. Excell Seltzer to respond. [name] will call her when he has responded re: MRI.

## 2011-01-21 NOTE — Telephone Encounter (Signed)
Please call about hip replacement dr

## 2011-01-21 NOTE — Telephone Encounter (Signed)
Pt called

## 2011-01-24 NOTE — Telephone Encounter (Signed)
Pt ok to have cardiac MRI. I reviewed MRI safety and hip prosthesis should not be a problem for a cardiac MRI.

## 2011-01-25 NOTE — Telephone Encounter (Addendum)
Kathleen Bollman, MD 01/24/2011 4:07 PM Signed  Pt ok to have cardiac MRI. I reviewed MRI safety and hip prosthesis should not be a problem for a cardiac MRI.  I spoke with the pt and made her aware of Cardiac MRI scheduled on 01/27/11 at 11:00.  The pt will come into the office on 01/26/11 at 12:30 for a BMP. The pt will need pre-meds due to contrast allergy and instructions to not take Metformin.  The pt will ask for me when she comes into the office on 01/26/11 and I will go over instructions at that time.  The pt will also need a follow-up office visit scheduled with Dr Excell Seltzer to review Cardiac MRI results.

## 2011-01-26 ENCOUNTER — Telehealth: Payer: Self-pay | Admitting: Cardiovascular Disease

## 2011-01-26 ENCOUNTER — Encounter: Payer: Self-pay | Admitting: Cardiovascular Disease

## 2011-01-26 ENCOUNTER — Other Ambulatory Visit (INDEPENDENT_AMBULATORY_CARE_PROVIDER_SITE_OTHER): Payer: Medicare Other | Admitting: *Deleted

## 2011-01-26 DIAGNOSIS — R9430 Abnormal result of cardiovascular function study, unspecified: Secondary | ICD-10-CM

## 2011-01-26 LAB — BASIC METABOLIC PANEL
BUN: 8 mg/dL (ref 6–23)
CO2: 26 mEq/L (ref 19–32)
GFR: 84.55 mL/min (ref >60.00)
Glucose, Bld: 96 mg/dL (ref 70–99)
Potassium: 3.6 mEq/L (ref 3.5–5.1)
Sodium: 137 mEq/L (ref 135–145)

## 2011-01-26 NOTE — Telephone Encounter (Signed)
Pt has a question about her procedure for tomorrow.  Pl call her.

## 2011-01-26 NOTE — Telephone Encounter (Signed)
I spoke with Dr Excell Seltzer about the pt having a contrast allergy and taking Metformin in regards to Cardiac MRI.  The pt will get gadolinium for this test and does not require pre-medications and does not have to hold metformin.

## 2011-01-26 NOTE — Telephone Encounter (Signed)
I spoke with the patient and she wanted to know if it was okay to take xanax prior to Cardiac MRI.  I told the pt that this would be okay and that she needs to have someone else drive her to/from the hospital.  The pt is also anxious about having a reaction to gadolinium.  I told the pt that she could take Benadryl 25mg  prior to test if she did not take Xanax.

## 2011-01-27 ENCOUNTER — Ambulatory Visit (HOSPITAL_COMMUNITY)
Admission: RE | Admit: 2011-01-27 | Discharge: 2011-01-27 | Disposition: A | Discharge status: Home / Self Care | Payer: Medicare Other | Source: Ambulatory Visit | Attending: Cardiovascular Disease | Admitting: Cardiovascular Disease

## 2011-01-27 DIAGNOSIS — J984 Other disorders of lung: Secondary | ICD-10-CM | POA: Insufficient documentation

## 2011-01-27 DIAGNOSIS — R9430 Abnormal result of cardiovascular function study, unspecified: Secondary | ICD-10-CM

## 2011-01-27 DIAGNOSIS — I319 Disease of pericardium, unspecified: Secondary | ICD-10-CM | POA: Insufficient documentation

## 2011-01-27 MED ORDER — GADOBENATE DIMEGLUMINE 529 MG/ML IV SOLN
20.0000 mL | Freq: Once | INTRAVENOUS | Status: AC
  Administered 2011-01-27: 20 mL via INTRAVENOUS

## 2011-01-28 ENCOUNTER — Ambulatory Visit (INDEPENDENT_AMBULATORY_CARE_PROVIDER_SITE_OTHER): Payer: Medicare Other | Admitting: Cardiovascular Disease

## 2011-01-28 ENCOUNTER — Encounter: Payer: Self-pay | Admitting: Cardiovascular Disease

## 2011-01-28 ENCOUNTER — Telehealth: Payer: Self-pay | Admitting: *Deleted

## 2011-01-28 DIAGNOSIS — C3410 Malignant neoplasm of upper lobe, unspecified bronchus or lung: Secondary | ICD-10-CM

## 2011-01-28 DIAGNOSIS — I2510 Atherosclerotic heart disease of native coronary artery without angina pectoris: Secondary | ICD-10-CM

## 2011-01-28 DIAGNOSIS — R222 Localized swelling, mass and lump, trunk: Secondary | ICD-10-CM

## 2011-01-28 DIAGNOSIS — J449 Chronic obstructive pulmonary disease, unspecified: Secondary | ICD-10-CM

## 2011-01-28 NOTE — Telephone Encounter (Signed)
Order placed to [facility]. 

## 2011-01-28 NOTE — Patient Instructions (Signed)
Your physician recommends that you schedule a follow-up appointment in: 12 months.  

## 2011-01-28 NOTE — Telephone Encounter (Signed)
Message copied by Julaine Hua on Thu Jan 28, 2011  9:01 AM ------      Message from: Cyril Mourning V      Created: Wed Jan 27, 2011  9:01 PM       Pl order Ct chest -NO contrast - & FU with dr Craige Cotta for results

## 2011-01-29 ENCOUNTER — Telehealth: Payer: Self-pay | Admitting: Pulmonary Disease

## 2011-01-29 DIAGNOSIS — R0981 Nasal congestion: Secondary | ICD-10-CM

## 2011-01-29 MED ORDER — AMOXICILLIN-POT CLAVULANATE 875-125 MG PO TABS: 1.0000 | ORAL_TABLET | Freq: Two times a day (BID) | ORAL | Status: DC

## 2011-01-29 NOTE — Telephone Encounter (Signed)
Called and spoke with pt---she is scheduled for her ct scan on Tuesday at 1.  She is requesting something to take for chest congestion, sore throat, chills, ears stopped up,hoarseness and cough in am with yellow sputum.  She stated that she has been off the abx for 3 weeks.  Pt stated that she also has chills.  Please advise--VS not in the office today.  MW please advise. thanks

## 2011-01-29 NOTE — Telephone Encounter (Signed)
Called and spoke with pt and she is aware per MW to start on the augmentin 875mg    1 bid x 5 days.  This rx has been sent in to the pharmacy and pt is aware.  Pt also aware of ct sinus that has been ordered for Tuesday as well.  Pt to call back for any problems.

## 2011-01-29 NOTE — Telephone Encounter (Signed)
Ok to use augmentin 875 bid x 5 days until CT done, need to add sinus ct to request to be complete

## 2011-02-02 ENCOUNTER — Ambulatory Visit (INDEPENDENT_AMBULATORY_CARE_PROVIDER_SITE_OTHER)
Admission: RE | Admit: 2011-02-02 | Discharge: 2011-02-02 | Disposition: A | Discharge status: Home / Self Care | Payer: Medicare Other | Source: Ambulatory Visit | Attending: Pulmonary Disease | Admitting: Pulmonary Disease

## 2011-02-02 ENCOUNTER — Encounter: Payer: Self-pay | Admitting: Internal Medicine

## 2011-02-02 ENCOUNTER — Ambulatory Visit (INDEPENDENT_AMBULATORY_CARE_PROVIDER_SITE_OTHER)
Admission: RE | Admit: 2011-02-02 | Discharge: 2011-02-02 | Disposition: A | Discharge status: Home / Self Care | Payer: Medicare Other | Source: Ambulatory Visit | Attending: Internal Medicine | Admitting: Internal Medicine

## 2011-02-02 DIAGNOSIS — C3410 Malignant neoplasm of upper lobe, unspecified bronchus or lung: Secondary | ICD-10-CM

## 2011-02-02 DIAGNOSIS — J4489 Other specified chronic obstructive pulmonary disease: Secondary | ICD-10-CM

## 2011-02-02 DIAGNOSIS — J449 Chronic obstructive pulmonary disease, unspecified: Secondary | ICD-10-CM

## 2011-02-02 DIAGNOSIS — J3489 Other specified disorders of nose and nasal sinuses: Secondary | ICD-10-CM

## 2011-02-02 DIAGNOSIS — R0981 Nasal congestion: Secondary | ICD-10-CM

## 2011-02-03 ENCOUNTER — Telehealth: Payer: Self-pay | Admitting: Internal Medicine

## 2011-02-03 NOTE — Telephone Encounter (Signed)
Discussed results with pt over phone.  Explained main concern is recurrent malignancy.  Will have my nurse schedule ROV within next week to review in more detail (okay to overbook).

## 2011-02-03 NOTE — Telephone Encounter (Signed)
Spoke with pt and sched her to see VS on Monday 02/08/11 at 9:30 am.

## 2011-02-08 ENCOUNTER — Telehealth: Payer: Self-pay | Admitting: Pulmonary Disease

## 2011-02-08 ENCOUNTER — Ambulatory Visit (INDEPENDENT_AMBULATORY_CARE_PROVIDER_SITE_OTHER): Payer: Medicare Other | Admitting: Pulmonary Disease

## 2011-02-08 ENCOUNTER — Encounter: Payer: Self-pay | Admitting: Pulmonary Disease

## 2011-02-08 DIAGNOSIS — J449 Chronic obstructive pulmonary disease, unspecified: Secondary | ICD-10-CM

## 2011-02-08 DIAGNOSIS — G4710 Hypersomnia, unspecified: Secondary | ICD-10-CM

## 2011-02-08 DIAGNOSIS — J4489 Other specified chronic obstructive pulmonary disease: Secondary | ICD-10-CM

## 2011-02-08 DIAGNOSIS — J329 Chronic sinusitis, unspecified: Secondary | ICD-10-CM

## 2011-02-08 DIAGNOSIS — C3411 Malignant neoplasm of upper lobe, right bronchus or lung: Secondary | ICD-10-CM | POA: Insufficient documentation

## 2011-02-08 DIAGNOSIS — R222 Localized swelling, mass and lump, trunk: Secondary | ICD-10-CM

## 2011-02-08 HISTORY — DX: Malignant neoplasm of upper lobe, right bronchus or lung: C34.11

## 2011-02-08 NOTE — Assessment & Plan Note (Signed)
She has finished her recent course of antibiotics.  I don't think she needs additional antibiotics at this time.

## 2011-02-08 NOTE — Assessment & Plan Note (Signed)
She has new lesions seen on CT chest as detailed above.  Main concern is that she has recurrent malignancy.  I have reviewed her CT results with her.  Will proceed with PET scan first, and then determine what biopsy approach will be best suited for her lesions.

## 2011-02-08 NOTE — Patient Instructions (Signed)
Continue symbicort two puffs twice per day and combivent 2 puffs up to four times per day as needed for cough, wheeze, shortness of breath, or chest congestion Will schedule PET scan and call with results Follow up in 3 weeks

## 2011-02-08 NOTE — Progress Notes (Signed)
Chief Complaint  Patient presents with  . Results    Pt here to discuss her ct results in more detail. Pt c/o increase SOB w/ ecertion and at rest    History of Present Illness: 65 yo female former smoker with GOLD 2 COPD and prior hx of NSCLC.  She is here to review her CT chest from 02/02/11.  She had incidental finding of right upper lobe mass during cardiac MRI.  This was confirmed on CT chest as detailed below.  She has been getting chest discomfort.  She has noticed more trouble with her breathing with exertion.  She has cough and wheeze.  She is bringing up clear to yellow sputum.  She has felt cold.  She denies fever or hemoptysis.  She was recently treated with augmentin.  She has noticed some ear drainage on the right side, and soreness in her neck glands on the right side.  She was switched earlier this month from advair to symbicort to see if this improves her hoarseness.  She is not sure how much this has helped.  She feels the inhalers work better than her nebulizer.  Past Medical History  Diagnosis Date  . COPD (chronic obstructive pulmonary disease)     gold 2  . Adenocarcinoma, lung     upper left lobe  . Polyneuropathy in diabetes   . Restless legs syndrome (RLS)   . Chronic sinusitis   . Hypertension   . Hyperlipidemia   . Type II or unspecified type diabetes mellitus without mention of complication, not stated as uncontrolled   . Coronary artery disease   . Anemia, unspecified   . GERD (gastroesophageal reflux disease)     Past Surgical History  Procedure Date  . Lung lobectomy     left  . Septoplasty   . Total hip arthroplasty 01/2010    left    Current Outpatient Prescriptions on File Prior to Visit  Medication Sig Dispense Refill  . albuterol (PROAIR HFA) 108 (90 BASE) MCG/ACT inhaler Inhale 2 puffs into the lungs every 6 (six) hours as needed.        Marland Kitchen albuterol-ipratropium (COMBIVENT) 18-103 MCG/ACT inhaler Inhale 2 puffs into the lungs every 6  (six) hours as needed.        . ALPRAZolam (XANAX) 1 MG tablet Take 1 mg by mouth 3 (three) times daily.        Marland Kitchen aspirin EC 325 MG tablet Take 325 mg by mouth daily.        . citalopram (CELEXA) 40 MG tablet Take 1 tablet by mouth at bedtime.      Marland Kitchen diltiazem (CARDIZEM CD) 240 MG 24 hr capsule Take 240 mg by mouth daily.        Marland Kitchen gemfibrozil (LOPID) 600 MG tablet Take 600 mg by mouth 2 (two) times daily before a meal.        . HYDROcodone-acetaminophen (VICODIN ES) 7.5-750 MG per tablet Take 1 tablet by mouth every 6 (six) hours as needed.        . lidocaine (LIDODERM) 5 % Place 1-3 patches onto the skin as needed. Remove & Discard patch within 12 hours or as directed by MD      . losartan (COZAAR) 50 MG tablet Take 50 mg by mouth 2 (two) times daily.        . metFORMIN (GLUCOPHAGE) 500 MG tablet Take 500 mg by mouth 2 (two) times daily with a meal.       .  omeprazole (PRILOSEC) 20 MG capsule Take 20 mg by mouth 2 (two) times daily.        . potassium chloride SA (K-DUR,KLOR-CON) 20 MEQ tablet Take 1 tablet by mouth Twice daily.      . traMADol (ULTRAM) 50 MG tablet Take 50 mg by mouth every 4 (four) hours as needed.        . triamterene-hydrochlorothiazide (MAXZIDE-25) 37.5-25 MG per tablet Take 1 tablet by mouth daily.        Allergies  Allergen Reactions  . Codeine     REACTION: GI upset  . Iohexol   . Penicillins     REACTION: itching    Physical Exam:  Blood pressure 148/68, pulse 90, temperature 97.7 F (36.5 C), temperature source Oral, height 5\' 6"  (1.676 m), weight 193 lb 6.4 oz (87.726 kg), SpO2 96.00%.  General - obese  HEENT - clear nasal discharge, no sinus tenderness, no oral exudate, no LAN, TM clear  Cardiac - s1s2 regular, no murmur  Chest - prolonged exhalation, decreased breath sounds, no wheeze/rales/dullness  Abd - soft, nontender  Ext - no edema  Neuro - normal strength, CN intact  Psych - normal mood/behavior  CT CHEST WITHOUT CONTRAST  02/02/11:  Technique: Multidetector CT imaging of the chest was performed  following the standard protocol without IV contrast.  Comparison: Chest CT with contrast 05/09/2003  Findings: There are emphysematous changes in the lungs. Within the  right posterior apical segment of the right upper lobe, there is a  spiculated mass measuring 3.3 x 8-0.7 cm. The findings are  suspicious for bronchogenic neoplasm. Within the right middle lobe,  there is a geographic area of ground-glass density. Findings may  be related to infectious process but neoplasm is not excluded.  Less likely, findings could be related to scarring.  A faint nodule is identified within the left upper lobe, measuring  8 mm. Scattered, faint, one - 2 mm nodules are identified within  the right lung. These are too small to characterize but are  worrisome for metastases in this setting. There are no focal  consolidations or pleural effusions. There are small prevascular,  pretracheal, and precarinal lymph nodes. The largest is seen  within the precarinal region, measuring 2.2 x 1.1 cm in diameter.  The thyroid gland has a normal appearance. No lytic or blastic  lesions are identified within the spine. There are degenerative  changes present.  Images of the upper abdomen show probable calcified gallstone.  IMPRESSION:  1. Spiculated mass within the right upper lobe, suspicious for  bronchogenic carcinoma.  2. Right middle lobe infiltrate, scarring, or neoplasm.  2. Scattered, tiny low attenuation nodules identified within the  lungs bilaterally, described above.  4. Small mediastinal lymph nodes, nonspecific. The largest in the  precarinal region is 2.2 cm.  Spirometry 02/08/11>>FEV1 1.50(61%), FEV1% 65  Assessment/Plan:  Chest mass She has new lesions seen on CT chest as detailed above.  Main concern is that she has recurrent malignancy.  I have reviewed her CT results with her.  Will proceed with PET scan first,  and then determine what biopsy approach will be best suited for her lesions.  COPD She is to continue symbicort and as needed combivent.  SINUSITIS, CHRONIC NOS She has finished her recent course of antibiotics.  I don't think she needs additional antibiotics at this time.  HYPERSOMNIA Will repeat overnight oximetry to determine if she needs home oxygen.     Outpatient Encounter Prescriptions as of  02/08/2011  Medication Sig Dispense Refill  . albuterol (PROAIR HFA) 108 (90 BASE) MCG/ACT inhaler Inhale 2 puffs into the lungs every 6 (six) hours as needed.        Marland Kitchen albuterol-ipratropium (COMBIVENT) 18-103 MCG/ACT inhaler Inhale 2 puffs into the lungs every 6 (six) hours as needed.        . ALPRAZolam (XANAX) 1 MG tablet Take 1 mg by mouth 3 (three) times daily.        Marland Kitchen aspirin EC 325 MG tablet Take 325 mg by mouth daily.        . budesonide-formoterol (SYMBICORT) 160-4.5 MCG/ACT inhaler Inhale 2 puffs into the lungs 2 (two) times daily.        . citalopram (CELEXA) 40 MG tablet Take 1 tablet by mouth at bedtime.      Marland Kitchen diltiazem (CARDIZEM CD) 240 MG 24 hr capsule Take 240 mg by mouth daily.        Marland Kitchen gemfibrozil (LOPID) 600 MG tablet Take 600 mg by mouth 2 (two) times daily before a meal.        . HYDROcodone-acetaminophen (VICODIN ES) 7.5-750 MG per tablet Take 1 tablet by mouth every 6 (six) hours as needed.        . lidocaine (LIDODERM) 5 % Place 1-3 patches onto the skin as needed. Remove & Discard patch within 12 hours or as directed by MD      . losartan (COZAAR) 50 MG tablet Take 50 mg by mouth 2 (two) times daily.        . metFORMIN (GLUCOPHAGE) 500 MG tablet Take 500 mg by mouth 2 (two) times daily with a meal.       . omeprazole (PRILOSEC) 20 MG capsule Take 20 mg by mouth 2 (two) times daily.        . potassium chloride SA (K-DUR,KLOR-CON) 20 MEQ tablet Take 1 tablet by mouth Twice daily.      . traMADol (ULTRAM) 50 MG tablet Take 50 mg by mouth every 4 (four) hours as needed.         . triamterene-hydrochlorothiazide (MAXZIDE-25) 37.5-25 MG per tablet Take 1 tablet by mouth daily.      Marland Kitchen DISCONTD: amoxicillin-clavulanate (AUGMENTIN) 875-125 MG per tablet Take 1 tablet by mouth 2 (two) times daily.  10 tablet  0    Dinita Migliaccio 02/08/2011, 10:11 AM

## 2011-02-08 NOTE — Assessment & Plan Note (Signed)
She is to continue symbicort and as needed combivent.

## 2011-02-08 NOTE — Telephone Encounter (Signed)
Contact number in epic for this doctor is for hospice, but he is also at [facility] one day a week as a PCP, so information corrected and ov note faxed to correct place.. Sign

## 2011-02-08 NOTE — Assessment & Plan Note (Signed)
Will repeat overnight oximetry to determine if she needs home oxygen.

## 2011-02-09 ENCOUNTER — Telehealth: Payer: Self-pay | Admitting: Pulmonary Disease

## 2011-02-09 NOTE — Telephone Encounter (Signed)
Spoke with patient- she states she has a metal hip on Left side and that she is allergic to either IVP dye or a certain kind of dye-Dr Excell Seltzer her cardiologist would know which one-pt requests we call Dr Randolm Idol office and check with him first. Pt is scheduled for PET scan on Friday at 1045am. Once we know if she is allergic or not then we can send message to VS for final say in doing PET or not. Pt is aware that Dr Randolm Idol office is closed for the day and Triage will have to call in morning.

## 2011-02-10 ENCOUNTER — Telehealth: Payer: Self-pay | Admitting: Cardiovascular Disease

## 2011-02-10 NOTE — Telephone Encounter (Signed)
Per [name],  patient schedule to PET scan 11/2 @ 10:45.  Pt allergic to dye unsure what it is. Patient states  Dr. Excell Seltzer will know  What she allergic to.

## 2011-02-10 NOTE — Telephone Encounter (Signed)
I spoke with Juliette Alcide from Dr. Earmon Phoenix office and she states they have the same records as we do. I spoke with Bjorn Loser and she gave me a # to call [phone]. I spoke with [place] and he states they do not use dye for the PET scan, they use radioactive glucose. I spoke with [name] and she is aware of this. I spoke with melinda to make her aware this has been taking care of. She verbalized understanding.

## 2011-02-10 NOTE — Telephone Encounter (Signed)
I left a message for Dr. Earmon Phoenix office to call us back.

## 2011-02-10 NOTE — Telephone Encounter (Signed)
Spoke with [name] she stated not to worry about message.  Stated they do not even use dye with PET scan being done

## 2011-02-11 ENCOUNTER — Telehealth: Payer: Self-pay | Admitting: Pulmonary Disease

## 2011-02-11 DIAGNOSIS — J9691 Respiratory failure, unspecified with hypoxia: Secondary | ICD-10-CM

## 2011-02-11 DIAGNOSIS — C3490 Malignant neoplasm of unspecified part of unspecified bronchus or lung: Secondary | ICD-10-CM

## 2011-02-11 HISTORY — PX: BRONCHOSCOPY: SUR163

## 2011-02-11 NOTE — Telephone Encounter (Signed)
Called and spoke with pt. Pt states she was told she has "spots on her lungs" seen on CT scan.   Pt wanted to be sure with VS and the radiologist that these were really spots in her lungs and not "blockages in her aorta."  Pt requested that I forward this message to VS to address.

## 2011-02-12 ENCOUNTER — Ambulatory Visit (HOSPITAL_COMMUNITY)
Admission: RE | Admit: 2011-02-12 | Discharge: 2011-02-12 | Disposition: A | Discharge status: Home / Self Care | Payer: Medicare Other | Source: Ambulatory Visit | Attending: Pulmonary Disease | Admitting: Pulmonary Disease

## 2011-02-12 DIAGNOSIS — J984 Other disorders of lung: Secondary | ICD-10-CM | POA: Insufficient documentation

## 2011-02-12 DIAGNOSIS — R222 Localized swelling, mass and lump, trunk: Secondary | ICD-10-CM | POA: Insufficient documentation

## 2011-02-12 LAB — GLUCOSE, CAPILLARY: Glucose-Capillary: 110 mg/dL — ABNORMAL HIGH (ref 70–99)

## 2011-02-12 MED ORDER — FLUDEOXYGLUCOSE F - 18 (FDG) INJECTION
15.7000 | Freq: Once | INTRAVENOUS | Status: AC | PRN
  Administered 2011-02-12: 15.7 via INTRAVENOUS

## 2011-02-15 ENCOUNTER — Encounter: Payer: Self-pay | Admitting: Pulmonary Disease

## 2011-02-15 NOTE — Telephone Encounter (Signed)
Discussed PET scan results with pt. Will need to proceed with bronchoscopy. Explained procedure to patient.  Risk were detailed as bleeding, infection, pneumothorax, and non-diagnosis.  Test scheduled for 02/18/11 at 12 pm at [facility].  Explained that she needs to arrive at the hospital 1 hour before test time.  She needs to arrange for transportation to and from the hospital.  She will need to be npo after midnight the night before the test.  Also reviewed her room air ONO.  This showed significant oxygen desaturation.  She will need to use oxygen with exertion and sleep at 2 liters, and will repeat ONO with 2 liters.

## 2011-02-15 NOTE — Telephone Encounter (Signed)
Discussed PET scan results with pt.  Will need to proceed with bronchoscopy.  Explained procedure to patient.

## 2011-02-18 ENCOUNTER — Encounter (HOSPITAL_COMMUNITY): Payer: Self-pay | Admitting: Radiology

## 2011-02-18 ENCOUNTER — Ambulatory Visit (HOSPITAL_COMMUNITY): Payer: Medicare Other

## 2011-02-18 ENCOUNTER — Other Ambulatory Visit: Payer: Self-pay | Admitting: Pulmonary Disease

## 2011-02-18 ENCOUNTER — Ambulatory Visit (HOSPITAL_COMMUNITY)
Admission: RE | Admit: 2011-02-18 | Discharge: 2011-02-18 | Disposition: A | Discharge status: Home / Self Care | Payer: Medicare Other | Source: Ambulatory Visit | Attending: Pulmonary Disease | Admitting: Pulmonary Disease

## 2011-02-18 ENCOUNTER — Encounter (HOSPITAL_COMMUNITY)
Admission: RE | Disposition: A | Discharge status: Home / Self Care | Payer: Self-pay | Source: Ambulatory Visit | Attending: Pulmonary Disease

## 2011-02-18 DIAGNOSIS — J4489 Other specified chronic obstructive pulmonary disease: Secondary | ICD-10-CM | POA: Insufficient documentation

## 2011-02-18 DIAGNOSIS — Z79899 Other long term (current) drug therapy: Secondary | ICD-10-CM | POA: Insufficient documentation

## 2011-02-18 DIAGNOSIS — Z85118 Personal history of other malignant neoplasm of bronchus and lung: Secondary | ICD-10-CM | POA: Insufficient documentation

## 2011-02-18 DIAGNOSIS — Z87891 Personal history of nicotine dependence: Secondary | ICD-10-CM | POA: Insufficient documentation

## 2011-02-18 DIAGNOSIS — I2510 Atherosclerotic heart disease of native coronary artery without angina pectoris: Secondary | ICD-10-CM | POA: Insufficient documentation

## 2011-02-18 DIAGNOSIS — R222 Localized swelling, mass and lump, trunk: Secondary | ICD-10-CM | POA: Insufficient documentation

## 2011-02-18 DIAGNOSIS — E785 Hyperlipidemia, unspecified: Secondary | ICD-10-CM | POA: Insufficient documentation

## 2011-02-18 DIAGNOSIS — E1142 Type 2 diabetes mellitus with diabetic polyneuropathy: Secondary | ICD-10-CM | POA: Insufficient documentation

## 2011-02-18 DIAGNOSIS — G4710 Hypersomnia, unspecified: Secondary | ICD-10-CM | POA: Insufficient documentation

## 2011-02-18 DIAGNOSIS — J449 Chronic obstructive pulmonary disease, unspecified: Secondary | ICD-10-CM | POA: Insufficient documentation

## 2011-02-18 DIAGNOSIS — Z902 Acquired absence of lung [part of]: Secondary | ICD-10-CM | POA: Insufficient documentation

## 2011-02-18 DIAGNOSIS — E1149 Type 2 diabetes mellitus with other diabetic neurological complication: Secondary | ICD-10-CM | POA: Insufficient documentation

## 2011-02-18 DIAGNOSIS — I10 Essential (primary) hypertension: Secondary | ICD-10-CM | POA: Insufficient documentation

## 2011-02-18 DIAGNOSIS — C3490 Malignant neoplasm of unspecified part of unspecified bronchus or lung: Secondary | ICD-10-CM

## 2011-02-18 DIAGNOSIS — K219 Gastro-esophageal reflux disease without esophagitis: Secondary | ICD-10-CM | POA: Insufficient documentation

## 2011-02-18 DIAGNOSIS — J329 Chronic sinusitis, unspecified: Secondary | ICD-10-CM | POA: Insufficient documentation

## 2011-02-18 SURGERY — BRONCHOSCOPY, WITH FLUOROSCOPY
Anesthesia: Moderate Sedation | Laterality: Bilateral | Wound class: Clean Contaminated

## 2011-02-18 MED ORDER — PHENYLEPHRINE HCL 0.25 % NA SOLN: 1.0000 | Freq: Four times a day (QID) | NASAL | Status: DC | PRN

## 2011-02-18 MED ORDER — FENTANYL CITRATE 0.05 MG/ML IJ SOLN
INTRAMUSCULAR | Status: AC
  Filled 2011-02-18: qty 4

## 2011-02-18 MED ORDER — SODIUM CHLORIDE 0.9 % IV SOLN
Freq: Once | INTRAVENOUS | Status: AC
  Administered 2011-02-18: 20 mL via INTRAVENOUS

## 2011-02-18 MED ORDER — FENTANYL CITRATE 0.05 MG/ML IJ SOLN
50.0000 ug | INTRAMUSCULAR | Status: AC | PRN
  Administered 2011-02-18 (×2): 50 ug via INTRAVENOUS

## 2011-02-18 MED ORDER — LIDOCAINE HCL 1 % IJ SOLN
5.0000 mL | INTRAMUSCULAR | Status: DC | PRN
  Administered 2011-02-18: 5 mL

## 2011-02-18 MED ORDER — MIDAZOLAM HCL 10 MG/2ML IJ SOLN
INTRAMUSCULAR | Status: AC
  Filled 2011-02-18: qty 4

## 2011-02-18 MED ORDER — PHENYLEPHRINE HCL 1 % NA SOLN: 1.0000 [drp] | Freq: Four times a day (QID) | NASAL | Status: DC | PRN

## 2011-02-18 MED ORDER — MIDAZOLAM HCL 1 MG/ML IJ SOLN
2.0000 mg | INTRAMUSCULAR | Status: AC | PRN
  Administered 2011-02-18 (×3): 2 mg via INTRAVENOUS

## 2011-02-18 MED ORDER — LIDOCAINE HCL 2 % EX GEL: Freq: Once | CUTANEOUS | Status: DC

## 2011-02-18 MED FILL — Midazolam HCl Inj 2 MG/2ML (Base Equivalent): INTRAMUSCULAR | Qty: 6 | Status: AC

## 2011-02-18 NOTE — Progress Notes (Signed)
02/18/11 1403  Vitals  ECG Heart Rate 70   Resp 17   BP ! 160/40 mmHg  SpO2 94 %  Oxygen Therapy  O2 Device None (Room air)  Pulse Oximetry Type Continuous  LOC  Level of Consciousness Alert  Orientation Level Oriented X4   PROCEDURE COMPLETE

## 2011-02-18 NOTE — Progress Notes (Signed)
02/18/11 1213  Vitals  ECG Heart Rate 75   Resp 13   BP ! 153/61 mmHg  SpO2 100 %  Oxygen Therapy  O2 Device Nasal cannula  O2 Flow Rate (L/min) 4 L/min  Pulse Oximetry Type Continuous  SpO2 Alarm Limit Low 92    start procedure

## 2011-02-18 NOTE — Procedures (Signed)
Piet[facility] Zuluaga [facility]                                                                     02/18/2011 10[birth year]01-43 [id number]    Pre-Operative Diagnosis: r/o lung cancer  Post-Operative Diagnosis: r/o lung cancer  Test: Bronchoscopy, BAL and biopsy RML and RUL.  Indication: [name] is a 65 y.o. female found to have mass in right upper lobe and ground glass opacification right middle lobe on chest imaging study from February 02, 2011.  Bronchoscopy is scheduled for further evaluation.  Risks of procedure were detailed as bleeding, infection, pneumothorax, and non-diagnosis.  Consent form was signed.  Procedure: The patient was brought to the bronchoscopy suite.  She was given 6 mg versed and 100 mcg fentanyl intravenously for sedation and analgesia.  The bronchoscope was entered orally.  1% lidocaine was instilled as needed for topical anesthesia of the airway.  The vocal cords were visualized and had normal motion.  T  he bronchoscope was entered into the trachea.  The main carina was visualized and did not appear to be widened.    The bronchoscope was entered into the left main bronchus.  Changes of prior left upper lobectomy seen.  The left lower lobe orifices were visualized.  The mucosa appeared normal and there were no endobronchial lesions.  The bronchoscope was then entered in the right main bronchus.  The right upper, middle, and lower lobes were visualized.  The mucosa appeared normal and there were no endobronchial lesions.    Then 80 ml saline instilled into right middle lobe with 15 ml cloudy, white fluid returned.  Then using fluoroscopic guidance transbronchial biopsy obtained from right middle lobe.  There was minimal amount of bleeding which resolved after instilling saline.  Focus was then shifted to right upper lobe.  80 ml of saline instilled into right upper lobe with 10 ml of bloody fluid returned.  Then using fluoroscopic guidance transbronchial biopsies were obtained  from right upper lobe.  The airways were then inspected, and no evidence for bleeding.  The bronchoscope was withdrawn.  There were no immediate complications.  The patient was returned to recovery in stable condition.  Post-procedure chest xray is pending.  The specimens will be sent for cytology and surgical pathology.  Will call her to schedule follow up when results are available.

## 2011-02-18 NOTE — Progress Notes (Signed)
SEE SHADOW CHART DEVICE FOR VITAL SIGNS DID NOT FLOW TO [facility] FLOWSHEET

## 2011-02-18 NOTE — Progress Notes (Signed)
02/18/11 1239  Vitals  ECG Heart Rate 91   Resp 17   BP ! 163/60 mmHg  SpO2 97 %  Oxygen Therapy  O2 Device Nasal cannula  O2 Flow Rate (L/min) 4 L/min  Pulse Oximetry Type Continuous   start recovery

## 2011-02-18 NOTE — Progress Notes (Signed)
HPI:  This is a 65 year old woman presenting for followup evaluation. She has a history of nonobstructive coronary artery disease and peripheral arterial disease. She was seen in September for evaluation of chest tightness and shortness of breath. It was initially thought that her symptoms were likely related to pulmonary disease but she did not get better with usual treatment. She was referred for a nuclear stress test and an echocardiogram. Her stress test showed no ischemia. Her echo showed brightness of the pericardium and a cardiac MRI was done. This showed no significant abnormalities with her heart, but there was an incidental finding of a lung mass. This patient has a history of lung cancer and the MRI finding was highly suspicious.  The patient reports no interval changes in her symptoms. She continues to have dyspnea with exertion. She denies recurrent chest pain.  Outpatient Encounter Prescriptions as of 01/28/2011  Medication Sig Dispense Refill  . albuterol (PROAIR HFA) 108 (90 BASE) MCG/ACT inhaler Inhale 2 puffs into the lungs every 6 (six) hours as needed.        Marland Kitchen albuterol-ipratropium (COMBIVENT) 18-103 MCG/ACT inhaler Inhale 2 puffs into the lungs every 6 (six) hours as needed.        . ALPRAZolam (XANAX) 1 MG tablet Take 1 mg by mouth 3 (three) times daily.        Marland Kitchen aspirin EC 325 MG tablet Take 325 mg by mouth daily.        . citalopram (CELEXA) 40 MG tablet Take 1 tablet by mouth at bedtime.      Marland Kitchen diltiazem (CARDIZEM CD) 240 MG 24 hr capsule Take 240 mg by mouth daily.        Marland Kitchen gemfibrozil (LOPID) 600 MG tablet Take 600 mg by mouth 2 (two) times daily before a meal.        . HYDROcodone-acetaminophen (VICODIN ES) 7.5-750 MG per tablet Take 1 tablet by mouth every 6 (six) hours as needed.        . lidocaine (LIDODERM) 5 % Place 1-3 patches onto the skin as needed. Remove & Discard patch within 12 hours or as directed by MD      . losartan (COZAAR) 50 MG tablet Take 50 mg by mouth  2 (two) times daily.        . metFORMIN (GLUCOPHAGE) 500 MG tablet Take 500 mg by mouth 2 (two) times daily with a meal.       . omeprazole (PRILOSEC) 20 MG capsule Take 20 mg by mouth 2 (two) times daily.        . potassium chloride SA (K-DUR,KLOR-CON) 20 MEQ tablet Take 1 tablet by mouth Twice daily.      . traMADol (ULTRAM) 50 MG tablet Take 50 mg by mouth every 4 (four) hours as needed.        . triamterene-hydrochlorothiazide (MAXZIDE-25) 37.5-25 MG per tablet Take 1 tablet by mouth daily.      Marland Kitchen DISCONTD: Fluticasone-Salmeterol (ADVAIR DISKUS) 250-50 MCG/DOSE AEPB Inhale 1 puff into the lungs every 12 (twelve) hours.          Allergies  Allergen Reactions  . Codeine     REACTION: GI upset  . Iohexol   . Penicillins     REACTION: itching    Past Medical History  Diagnosis Date  . COPD (chronic obstructive pulmonary disease)     gold 2  . Adenocarcinoma, lung     upper left lobe  . Polyneuropathy in diabetes   . Restless  legs syndrome (RLS)   . Chronic sinusitis   . Hypertension   . Hyperlipidemia   . Type II or unspecified type diabetes mellitus without mention of complication, not stated as uncontrolled   . Coronary artery disease   . Anemia, unspecified   . GERD (gastroesophageal reflux disease)   . Respiratory failure with hypoxia 01/02/2010   BP 142/50  Pulse 85  Ht 5\' 6"  (1.676 m)  Wt 86.637 kg (191 lb)  BMI 30.83 kg/m2  PHYSICAL EXAM: Pt is alert and oriented, NAD HEENT: normal Neck: JVP - normal, carotids 2+= without bruits Lungs: scattered rhonchi bilaterally CV: RRR without murmur or gallop Abd: soft, NT, Positive BS, no hepatomegaly Ext: no edema Skin: warm/dry no rash  ASSESSMENT AND PLAN:

## 2011-02-18 NOTE — Progress Notes (Signed)
02/18/11 1238  Vitals  ECG Heart Rate ! 104   Resp 17   BP ! 182/100 mmHg  SpO2 91 %  Oxygen Therapy  O2 Device Nasal cannula  O2 Flow Rate (L/min) 4 L/min  Pulse Oximetry Type Continuous  LOC  Level of Consciousness Alert  Orientation Level Oriented X4   end of procedure pt agitated

## 2011-02-18 NOTE — Assessment & Plan Note (Signed)
Nonobstructive CAD in past. Myoview result as detailed with no ischemia.

## 2011-02-18 NOTE — Assessment & Plan Note (Signed)
I reviewed the extracardiac findings on the MRI with the patient today.  She understands that there is concern for lung cancer. She has close followup and CT imaging arranged through Dr. Evlyn Courier office.  With her recent negative stress test, she will be at acceptable cardiac risk if surgery is required.

## 2011-02-19 ENCOUNTER — Encounter (HOSPITAL_COMMUNITY): Payer: Self-pay

## 2011-02-21 ENCOUNTER — Encounter: Payer: Self-pay | Admitting: Pulmonary Disease

## 2011-02-23 ENCOUNTER — Telehealth: Payer: Self-pay | Admitting: Pulmonary Disease

## 2011-02-23 DIAGNOSIS — R222 Localized swelling, mass and lump, trunk: Secondary | ICD-10-CM

## 2011-02-23 MED ORDER — PREDNISONE 20 MG PO TABS: 40.0000 mg | ORAL_TABLET | Freq: Every day | ORAL | Status: AC

## 2011-02-23 NOTE — Telephone Encounter (Signed)
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  Explained that this could be related to lesions being inflammatory process, or that specimens were not adequate for diagnosis of malignancy.  Explained that there is still concern for malignancy given radiographic appearance, but that based on bronch results inflammatory process is also possible.  Will give course of prednisone 40 mg daily for 3 weeks.  Will have my nurse schedule follow up in 3 weeks with chest xray.  If lesions persists at that point, will then need to reconsider additional biopsy attempts.  Advised patient to inform her PCP about being started on prednisone since she will likely need to have her diabetes regimen adjusted.

## 2011-02-23 NOTE — Telephone Encounter (Signed)
Pt's son notified of Dr. Evlyn Courier plan for his mother and will also be present at her next ov on 03/19/11. Pt aware we have spoke with her son and his questions were answered.

## 2011-02-23 NOTE — Telephone Encounter (Signed)
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 Explained that this could be related to lesions being inflammatory process, or that specimens were not adequate for diagnosis of malignancy. Explained that there is still concern for malignancy given radiographic appearance, but that based on bronch results inflammatory process is also possible.  Will give course of prednisone 40 mg daily for 3 weeks. Will have my nurse schedule follow up in 3 weeks with chest xray.  If lesions persists at that point, will then need to reconsider additional biopsy attempts.  Advised patient to inform her PCP about being started on prednisone since she will likely need to have her diabetes regimen adjusted.

## 2011-02-23 NOTE — Telephone Encounter (Signed)
I spoke with pt and she states she is going to call me back to schedule her apt

## 2011-02-23 NOTE — Telephone Encounter (Signed)
Pt is scheduled to come in 03/19/11 at 1:45 and is aware to have cxr done 1st.

## 2011-02-24 NOTE — H&P (Signed)
Chief Complaint   Patient presents with   .  Results     Pt here to discuss her ct results in more detail. Pt c/o increase SOB w/ ecertion and at rest    History of Present Illness:  65 yo female former smoker with GOLD 2 COPD and prior hx of NSCLC.  She is here to review her CT chest from 02/02/11.  She had incidental finding of right upper lobe mass during cardiac MRI. This was confirmed on CT chest as detailed below.  She has been getting chest discomfort. She has noticed more trouble with her breathing with exertion. She has cough and wheeze. She is bringing up clear to yellow sputum. She has felt cold. She denies fever or hemoptysis. She was recently treated with augmentin. She has noticed some ear drainage on the right side, and soreness in her neck glands on the right side. She was switched earlier this month from advair to symbicort to see if this improves her hoarseness. She is not sure how much this has helped. She feels the inhalers work better than her nebulizer.  Past Medical History   Diagnosis  Date   .  COPD (chronic obstructive pulmonary disease)      gold 2   .  Adenocarcinoma, lung      upper left lobe   .  Polyneuropathy in diabetes    .  Restless legs syndrome (RLS)    .  Chronic sinusitis    .  Hypertension    .  Hyperlipidemia    .  Type II or unspecified type diabetes mellitus without mention of complication, not stated as uncontrolled    .  Coronary artery disease    .  Anemia, unspecified    .  GERD (gastroesophageal reflux disease)     Past Surgical History   Procedure  Date   .  Lung lobectomy      left   .  Septoplasty    .  Total hip arthroplasty  01/2010     left    Current Outpatient Prescriptions on File Prior to Visit   Medication  Sig  Dispense  Refill   .  albuterol (PROAIR HFA) 108 (90 BASE) MCG/ACT inhaler  Inhale 2 puffs into the lungs every 6 (six) hours as needed.     Marland Kitchen  albuterol-ipratropium (COMBIVENT) 18-103 MCG/ACT inhaler  Inhale 2  puffs into the lungs every 6 (six) hours as needed.     .  ALPRAZolam (XANAX) 1 MG tablet  Take 1 mg by mouth 3 (three) times daily.     Marland Kitchen  aspirin EC 325 MG tablet  Take 325 mg by mouth daily.     .  citalopram (CELEXA) 40 MG tablet  Take 1 tablet by mouth at bedtime.     Marland Kitchen  diltiazem (CARDIZEM CD) 240 MG 24 hr capsule  Take 240 mg by mouth daily.     Marland Kitchen  gemfibrozil (LOPID) 600 MG tablet  Take 600 mg by mouth 2 (two) times daily before a meal.     .  HYDROcodone-acetaminophen (VICODIN ES) 7.5-750 MG per tablet  Take 1 tablet by mouth every 6 (six) hours as needed.     .  lidocaine (LIDODERM) 5 %  Place 1-3 patches onto the skin as needed. Remove & Discard patch within 12 hours or as directed by MD     .  losartan (COZAAR) 50 MG tablet  Take 50 mg by mouth 2 (two)  times daily.     .  metFORMIN (GLUCOPHAGE) 500 MG tablet  Take 500 mg by mouth 2 (two) times daily with a meal.     .  omeprazole (PRILOSEC) 20 MG capsule  Take 20 mg by mouth 2 (two) times daily.     .  potassium chloride SA (K-DUR,KLOR-CON) 20 MEQ tablet  Take 1 tablet by mouth Twice daily.     .  traMADol (ULTRAM) 50 MG tablet  Take 50 mg by mouth every 4 (four) hours as needed.     .  triamterene-hydrochlorothiazide (MAXZIDE-25) 37.5-25 MG per tablet  Take 1 tablet by mouth daily.      Allergies   Allergen  Reactions   .  Codeine      REACTION: GI upset   .  Iohexol    .  Penicillins      REACTION: itching    Physical Exam:  Blood pressure 148/68, pulse 90, temperature 97.7 F (36.5 C), temperature source Oral, height 5\' 6"  (1.676 m), weight 193 lb 6.4 oz (87.726 kg), SpO2 96.00%.  General - obese  HEENT - clear nasal discharge, no sinus tenderness, no oral exudate, no LAN, TM clear  Cardiac - s1s2 regular, no murmur  Chest - prolonged exhalation, decreased breath sounds, no wheeze/rales/dullness  Abd - soft, nontender  Ext - no edema  Neuro - normal strength, CN intact  Psych - normal mood/behavior  CT CHEST WITHOUT  CONTRAST 02/02/11:  Technique: Multidetector CT imaging of the chest was performed  following the standard protocol without IV contrast.  Comparison: Chest CT with contrast 05/09/2003  Findings: There are emphysematous changes in the lungs. Within the  right posterior apical segment of the right upper lobe, there is a  spiculated mass measuring 3.3 x 8-0.7 cm. The findings are  suspicious for bronchogenic neoplasm. Within the right middle lobe,  there is a geographic area of ground-glass density. Findings may  be related to infectious process but neoplasm is not excluded.  Less likely, findings could be related to scarring.  A faint nodule is identified within the left upper lobe, measuring  8 mm. Scattered, faint, one - 2 mm nodules are identified within  the right lung. These are too small to characterize but are  worrisome for metastases in this setting. There are no focal  consolidations or pleural effusions. There are small prevascular,  pretracheal, and precarinal lymph nodes. The largest is seen  within the precarinal region, measuring 2.2 x 1.1 cm in diameter.  The thyroid gland has a normal appearance. No lytic or blastic  lesions are identified within the spine. There are degenerative  changes present.  Images of the upper abdomen show probable calcified gallstone.  IMPRESSION:  1. Spiculated mass within the right upper lobe, suspicious for  bronchogenic carcinoma.  2. Right middle lobe infiltrate, scarring, or neoplasm.  2. Scattered, tiny low attenuation nodules identified within the  lungs bilaterally, described above.  4. Small mediastinal lymph nodes, nonspecific. The largest in the  precarinal region is 2.2 cm.  Spirometry 02/08/11>>FEV1 1.50(61%), FEV1% 65  Assessment/Plan:  Chest mass  She has new lesions seen on CT chest as detailed above. Main concern is that she has recurrent malignancy.  I have reviewed her CT results with her. Will proceed with PET scan first,  and then determine what biopsy approach will be best suited for her lesions.  COPD  She is to continue symbicort and as needed combivent.  SINUSITIS, CHRONIC NOS  She has finished her recent course of antibiotics. I don't think she needs additional antibiotics at this time.  HYPERSOMNIA  Will repeat overnight oximetry to determine if she needs home oxygen.   Outpatient Encounter Prescriptions as of 02/08/2011   Medication  Sig  Dispense  Refill   .  albuterol (PROAIR HFA) 108 (90 BASE) MCG/ACT inhaler  Inhale 2 puffs into the lungs every 6 (six) hours as needed.     Marland Kitchen  albuterol-ipratropium (COMBIVENT) 18-103 MCG/ACT inhaler  Inhale 2 puffs into the lungs every 6 (six) hours as needed.     .  ALPRAZolam (XANAX) 1 MG tablet  Take 1 mg by mouth 3 (three) times daily.     Marland Kitchen  aspirin EC 325 MG tablet  Take 325 mg by mouth daily.     .  budesonide-formoterol (SYMBICORT) 160-4.5 MCG/ACT inhaler  Inhale 2 puffs into the lungs 2 (two) times daily.     .  citalopram (CELEXA) 40 MG tablet  Take 1 tablet by mouth at bedtime.     Marland Kitchen  diltiazem (CARDIZEM CD) 240 MG 24 hr capsule  Take 240 mg by mouth daily.     Marland Kitchen  gemfibrozil (LOPID) 600 MG tablet  Take 600 mg by mouth 2 (two) times daily before a meal.     .  HYDROcodone-acetaminophen (VICODIN ES) 7.5-750 MG per tablet  Take 1 tablet by mouth every 6 (six) hours as needed.     .  lidocaine (LIDODERM) 5 %  Place 1-3 patches onto the skin as needed. Remove & Discard patch within 12 hours or as directed by MD     .  losartan (COZAAR) 50 MG tablet  Take 50 mg by mouth 2 (two) times daily.     .  metFORMIN (GLUCOPHAGE) 500 MG tablet  Take 500 mg by mouth 2 (two) times daily with a meal.     .  omeprazole (PRILOSEC) 20 MG capsule  Take 20 mg by mouth 2 (two) times daily.     .  potassium chloride SA (K-DUR,KLOR-CON) 20 MEQ tablet  Take 1 tablet by mouth Twice daily.     .  traMADol (ULTRAM) 50 MG tablet  Take 50 mg by mouth every 4 (four) hours as needed.       .  triamterene-hydrochlorothiazide (MAXZIDE-25) 37.5-25 MG per tablet  Take 1 tablet by mouth daily.     Marland Kitchen  DISCONTD: amoxicillin-clavulanate (AUGMENTIN) 875-125 MG per tablet  Take 1 tablet by mouth 2 (two) times daily.  10 tablet  0

## 2011-02-26 ENCOUNTER — Other Ambulatory Visit: Payer: Self-pay | Admitting: *Deleted

## 2011-02-26 MED ORDER — DILTIAZEM HCL ER COATED BEADS 240 MG PO CP24: 240.0000 mg | ORAL_CAPSULE | Freq: Every day | ORAL | Status: DC

## 2011-03-01 ENCOUNTER — Telehealth: Payer: Self-pay | Admitting: Pulmonary Disease

## 2011-03-01 DIAGNOSIS — J449 Chronic obstructive pulmonary disease, unspecified: Secondary | ICD-10-CM

## 2011-03-01 DIAGNOSIS — R222 Localized swelling, mass and lump, trunk: Secondary | ICD-10-CM

## 2011-03-01 NOTE — Telephone Encounter (Signed)
I spoke with [name] and she c/o sore throat, cough w/ pink ting phlem, some chest tightness, low grade fever (not sure what it is), feels tired x 2 days. [name] states she currently takes 40 mg prednisone a day. [name] is washing her mouth out after using her inhalers. [name] has not tried anything OTC. Since Dr. Craige Cotta is not in the office will forward to doc of the day for recs. Please advise Dr. Marchelle Gearing, thanks  Allergies  Allergen Reactions  . Codeine     REACTION: GI upset  . Iohexol   . Penicillins     REACTION: itching

## 2011-03-01 NOTE — Telephone Encounter (Signed)
Sounds like aecopd. She needs antibiotics and prednisone burst but before I customize it would like to know if she is on daily chronic prednisone - if so for how long and what dose. If not, when did she start prednisone 40mg  per day and has it started helping her?  Thanks MR

## 2011-03-01 NOTE — Telephone Encounter (Signed)
Reviewed case in more detail. I now realize she has lung mass for which he is treating her on high dose prednisone. Symptoms could be infectious or could be from prednisone.   Take doxycycline 100mg  po twice daily x 5 days; take after meals and avoid sunlight  But she needs to come in tomorrow with cxr and see MD/NP whoever available  If worse tonight, go to ER

## 2011-03-01 NOTE — Telephone Encounter (Signed)
Pt has been on Prednsione 40 mg daily since OV on 02/23/11 with VS. The patient says it seemed to help for the first few days but not now. Pls advise.

## 2011-03-01 NOTE — Telephone Encounter (Signed)
I spoke with pt and she is scheduled to come in tomorrow to see RB at 1:45. Pt is aware to have cxr done prior to OV. Pt states she will wait til she see's Dr. Delton Coombes before she starts an abx. Pt aware to go to ER if worse tonight. Will forward to Dr. Craige Cotta so he is aware.

## 2011-03-02 ENCOUNTER — Ambulatory Visit (INDEPENDENT_AMBULATORY_CARE_PROVIDER_SITE_OTHER)
Admission: RE | Admit: 2011-03-02 | Discharge: 2011-03-02 | Disposition: A | Discharge status: Home / Self Care | Payer: Medicare Other | Source: Ambulatory Visit | Attending: Pulmonary Disease | Admitting: Pulmonary Disease

## 2011-03-02 ENCOUNTER — Ambulatory Visit (INDEPENDENT_AMBULATORY_CARE_PROVIDER_SITE_OTHER): Payer: Medicare Other | Admitting: Emergency Medicine

## 2011-03-02 ENCOUNTER — Encounter: Payer: Self-pay | Admitting: Emergency Medicine

## 2011-03-02 DIAGNOSIS — J449 Chronic obstructive pulmonary disease, unspecified: Secondary | ICD-10-CM

## 2011-03-02 DIAGNOSIS — R222 Localized swelling, mass and lump, trunk: Secondary | ICD-10-CM

## 2011-03-02 MED ORDER — DOXYCYCLINE HYCLATE 100 MG PO TABS: 100.0000 mg | ORAL_TABLET | Freq: Two times a day (BID) | ORAL | Status: AC

## 2011-03-02 NOTE — Assessment & Plan Note (Signed)
S/p FOB without malignancy seen 02/18/11. Started on steroids, but agree that this appears worrisome for malignancy. She will be re-imaged by Dr Craige Cotta after steroid trial, but I suspect that she will need another procedure, possibly FOB with ENB. i would be happy to assist w this if indicated.

## 2011-03-02 NOTE — Progress Notes (Signed)
65 yo female former smoker with GOLD 2 COPD and prior hx of NSCLC, seen in 10/12 by DR Craige Cotta. Underwent FOB for RUL mass on CT chest from 02/02/11  Acute visit 03/02/11 -- Hx COPD, NSCLCA with new RUL mass, s/p fob by Dr Craige Cotta 11/8 - showed multi-nucleated giant cells, no malignancy. Treated with steroids. Presents now telling me that she has seen some blood streaked sputum, tailing off but has lasted longer than she expected. She is also having sore throat (? Symbicort, which is new), some congestion - sometimes, white, clear or dark brown. The blood is much less but sometimes pink sputum.  She is on pred to see if the RUL lesion will shrink.   Gen: Pleasant, obese, in no distress,  normal affect  ENT: No lesions,  mouth clear,  oropharynx clear, no postnasal drip  Neck: No JVD, no TMG, no carotid bruits  Lungs: No use of accessory muscles, no dullness to percussion, clear without rales or rhonchi  Cardiovascular: RRR, heart sounds normal, no murmur or gallops, no peripheral edema  Musculoskeletal: No deformities, no cyanosis or clubbing  Neuro: alert, non focal  Skin: Warm, no lesions or rashes  COPD Possible acute bronchitis - already on rx with steroids, believes she is seeing brown mucous, blood. She does have some epistaxis which could be source vs her RUL lesion.  - start doxy x 7 days - f/u dr Craige Cotta to assess improvement both in these sx and also the size of the mass (see below)  Chest mass S/p FOB without malignancy seen 02/18/11. Started on steroids, but agree that this appears worrisome for malignancy. She will be re-imaged by Dr Craige Cotta after steroid trial, but I suspect that she will need another procedure, possibly FOB with ENB. i would be happy to assist w this if indicated.

## 2011-03-02 NOTE — Assessment & Plan Note (Addendum)
Possible acute bronchitis - already on rx with steroids, believes she is seeing brown mucous, blood. She does have some epistaxis which could be source vs her RUL lesion.  - start doxy x 7 days - stay on Symbicort for now, although she c/o throat irritation since starting it - ? Whether this is acute process or due to the med.  - f/u dr Craige Cotta to assess improvement both in these sx and also the size of the mass (see below)

## 2011-03-02 NOTE — Telephone Encounter (Signed)
Noted  

## 2011-03-02 NOTE — Patient Instructions (Signed)
Please start doxycycline 100mg  twice a day for 1 week Continue your Symbicort as ordered Continue your prednisone as ordered by Dr Missy Sabins will need a repeat CXR and probably CT scan by Dr Craige Cotta in the next several weeks to assess your R lung lesion.  Follow with Dr Craige Cotta on 03/19/11 as planned

## 2011-03-03 ENCOUNTER — Telehealth: Payer: Self-pay | Admitting: Cardiovascular Disease

## 2011-03-03 ENCOUNTER — Telehealth: Payer: Self-pay | Admitting: Pulmonary Disease

## 2011-03-03 MED ORDER — POTASSIUM CHLORIDE CRYS ER 20 MEQ PO TBCR: 20.0000 meq | EXTENDED_RELEASE_TABLET | Freq: Two times a day (BID) | ORAL | Status: DC

## 2011-03-03 NOTE — Telephone Encounter (Signed)
RX sent into pharmacy. Pt notified. 

## 2011-03-03 NOTE — Telephone Encounter (Signed)
I spoke with pt and she states she has not been able to get a hold of her pcp to get her potassium refilled. She states she has only 2 days left. I advised pt VS was not in the office to approve [place] sending rx and she states that was fine she will see if Dr. Earmon [place] office will call in rx for her.

## 2011-03-03 NOTE — Telephone Encounter (Signed)
New problem Pt is calling about refill of potassium chloride pills Please call to [facility] [phone] She is out of pills

## 2011-03-08 ENCOUNTER — Telehealth: Payer: Self-pay | Admitting: Pulmonary Disease

## 2011-03-08 MED ORDER — FLUCONAZOLE 100 MG PO TABS: ORAL_TABLET | ORAL | Status: DC

## 2011-03-08 NOTE — Telephone Encounter (Signed)
I spoke with [name] and she states she has developed a vaginal yeast infection and thrush in her mouth from the abx doxycyline. [name] states she noticed this on Thursday. [name] states her mouth is red, raw, and burning. [name] states she would like something called in for this. [name] does not want any cream, would prefer tablet form. Since Dr. Craige Cotta is not in the office will forward to the doc of the day for recs. Please advise Dr. Delford Field, thanks  Allergies  Allergen Reactions  . Codeine     REACTION: GI upset  . Iohexol   . Penicillins     REACTION: itching

## 2011-03-08 NOTE — Telephone Encounter (Signed)
Call in diflucan 100mg  Sig Take two once then one daily until gone   #7tablets

## 2011-03-08 NOTE — Telephone Encounter (Signed)
Noted  

## 2011-03-08 NOTE — Telephone Encounter (Signed)
Pt is aware of PW recs and rx has been sent to pharmacy. Will send to Dr. Craige Cotta so he is aware.

## 2011-03-09 ENCOUNTER — Telehealth: Payer: Self-pay | Admitting: Pulmonary Disease

## 2011-03-09 NOTE — Telephone Encounter (Signed)
I have called py's diflucan into the pharmacy for pt. Pt is aware of this

## 2011-03-15 ENCOUNTER — Telehealth: Payer: Self-pay | Admitting: Pulmonary Disease

## 2011-03-15 NOTE — Telephone Encounter (Signed)
Pt also called back again to add the following: she has "a lot of indigestion caused by bronchitis, also very weak". [name]

## 2011-03-15 NOTE — Telephone Encounter (Signed)
She should add pepcid 20mg  bid to the prilosec and then see her PCP These symptoms are not related to her lungs

## 2011-03-15 NOTE — Telephone Encounter (Signed)
Spoke with pt and notified of recs per PW She verbalized understanding and states nothing further needed  

## 2011-03-15 NOTE — Telephone Encounter (Signed)
Spoke with pt. She states that she is having a lot of gas and bloating and increased indigestion the past few days. Taking prilosec bid without relief. I advised that she should contact her PCP regarding this and she refused to do this, stating, "this mass in my chest is what's causing all of this". She states that she also wants to know if she should be taking more abx "until they figure out what it is in my chest". She denies any cough or SOB. Only is c/o gas and bloating. Please advise, thanks!

## 2011-03-18 ENCOUNTER — Other Ambulatory Visit: Payer: Self-pay | Admitting: Pulmonary Disease

## 2011-03-18 DIAGNOSIS — R222 Localized swelling, mass and lump, trunk: Secondary | ICD-10-CM

## 2011-03-19 ENCOUNTER — Ambulatory Visit (INDEPENDENT_AMBULATORY_CARE_PROVIDER_SITE_OTHER)
Admission: RE | Admit: 2011-03-19 | Discharge: 2011-03-19 | Disposition: A | Discharge status: Home / Self Care | Payer: Medicare Other | Source: Ambulatory Visit | Attending: Pulmonary Disease | Admitting: Pulmonary Disease

## 2011-03-19 ENCOUNTER — Encounter: Payer: Self-pay | Admitting: Pulmonary Disease

## 2011-03-19 ENCOUNTER — Ambulatory Visit (INDEPENDENT_AMBULATORY_CARE_PROVIDER_SITE_OTHER): Payer: Medicare Other | Admitting: Pulmonary Disease

## 2011-03-19 DIAGNOSIS — R222 Localized swelling, mass and lump, trunk: Secondary | ICD-10-CM

## 2011-03-19 DIAGNOSIS — Z23 Encounter for immunization: Secondary | ICD-10-CM

## 2011-03-19 DIAGNOSIS — J9600 Acute respiratory failure, unspecified whether with hypoxia or hypercapnia: Secondary | ICD-10-CM

## 2011-03-19 DIAGNOSIS — J4489 Other specified chronic obstructive pulmonary disease: Secondary | ICD-10-CM

## 2011-03-19 DIAGNOSIS — M549 Dorsalgia, unspecified: Secondary | ICD-10-CM

## 2011-03-19 DIAGNOSIS — J449 Chronic obstructive pulmonary disease, unspecified: Secondary | ICD-10-CM

## 2011-03-19 DIAGNOSIS — J9691 Respiratory failure, unspecified with hypoxia: Secondary | ICD-10-CM

## 2011-03-19 MED ORDER — PREDNISONE 20 MG PO TABS: ORAL_TABLET | ORAL | Status: DC

## 2011-03-19 MED ORDER — HYDROCODONE-ACETAMINOPHEN 7.5-325 MG PO TABS: 1.0000 | ORAL_TABLET | Freq: Four times a day (QID) | ORAL | Status: DC | PRN

## 2011-03-19 NOTE — Patient Instructions (Addendum)
Prednisone 20 mg pills: 1.5 pills for 3 days, 1 pill for 3 days, 0.5 pills for 3 days Will arrange for repeat bronchoscopy Flu shot today Follow up in one month

## 2011-03-19 NOTE — Assessment & Plan Note (Signed)
As expected no change in Rt upper lobe mass from prednisone.  Will need to arrange for ENB with Dr. Delton Coombes.

## 2011-03-19 NOTE — Assessment & Plan Note (Signed)
I have refilled her script for narco with no refills.  Advised her that she will need to have this refilled in the future by her PCP or pain specialist.

## 2011-03-19 NOTE — Assessment & Plan Note (Signed)
She is to continue as needed combivent.

## 2011-03-19 NOTE — Progress Notes (Signed)
Chief Complaint  Patient presents with  . Follow-up    w/ cxr. Pt states she is having a lot of SOB, cough w/ very little clear to white phlem, wheezing, chest tightness, fever of 100-101 at night, hoarseness. pt states this has been going on x 3 months    History of Present Illness: [name] is a 65 y.o. female former smoker with GOLD 2 COPD and prior hx of NSCLC.  She developed another bronchitis last week, but is slowly improving.  She gets winded easily with exertion, and feels oxygen helps.  She feels the oxygen set up is too heavy, and is waiting for her DME to get smaller oxygen tanks.    She has been feeling bloated since being on prednisone.  Pepcid has helped.    She has a cough with white to yellow sputum.  She denies fever.  Past Medical History  Diagnosis Date  . COPD (chronic obstructive pulmonary disease)     gold 2  . Adenocarcinoma, lung     upper left lobe  . Polyneuropathy in diabetes   . Restless legs syndrome (RLS)   . Chronic sinusitis   . Hypertension   . Hyperlipidemia   . Type II or unspecified type diabetes mellitus without mention of complication, not stated as uncontrolled   . Coronary artery disease   . Anemia, unspecified   . GERD (gastroesophageal reflux disease)   . Respiratory failure with hypoxia 01/02/2010    Past Surgical History  Procedure Date  . Lung lobectomy     left  . Septoplasty   . Total hip arthroplasty 01/2010    left  . Bronchoscopy 02/2011    Current Outpatient Prescriptions on File Prior to Visit  Medication Sig Dispense Refill  . albuterol (PROAIR HFA) 108 (90 BASE) MCG/ACT inhaler Inhale 2 puffs into the lungs every 6 (six) hours as needed.        Marland Kitchen albuterol-ipratropium (COMBIVENT) 18-103 MCG/ACT inhaler Inhale 2 puffs into the lungs every 6 (six) hours as needed.        . ALPRAZolam (XANAX) 1 MG tablet Take 1 mg by mouth 3 (three) times daily.        Marland Kitchen aspirin EC 325 MG tablet Take 325 mg by mouth daily.         . citalopram (CELEXA) 40 MG tablet Take 1 tablet by mouth at bedtime.      Marland Kitchen diltiazem (CARDIZEM CD) 240 MG 24 hr capsule Take 1 capsule (240 mg total) by mouth daily.  30 capsule  3  . gemfibrozil (LOPID) 600 MG tablet Take 600 mg by mouth 2 (two) times daily before a meal.        . HYDROcodone-acetaminophen (VICODIN ES) 7.5-750 MG per tablet Take 1 tablet by mouth every 6 (six) hours as needed.        Marland Kitchen losartan (COZAAR) 50 MG tablet Take 50 mg by mouth 2 (two) times daily.        . metFORMIN (GLUCOPHAGE) 500 MG tablet Take 500 mg by mouth 2 (two) times daily with a meal.       . potassium chloride SA (K-DUR,KLOR-CON) 20 MEQ tablet Take 1 tablet (20 mEq total) by mouth 2 (two) times daily.  60 tablet  6  . traMADol (ULTRAM) 50 MG tablet Take 50 mg by mouth every 4 (four) hours as needed.        . triamterene-hydrochlorothiazide (MAXZIDE-25) 37.5-25 MG per tablet Take 1 tablet by mouth  daily.        Allergies  Allergen Reactions  . Codeine     REACTION: GI upset  . Iohexol   . Penicillins     REACTION: itching    Physical Exam:  Blood pressure 160/50, pulse 93, temperature 98.8 F (37.1 C), temperature source Oral, height 5\' 6"  (1.676 m), weight 188 lb (85.276 kg), SpO2 96.00%. Body mass index is 30.34 kg/(m^2).  General - obese  HEENT - clear nasal discharge, no sinus tenderness, no oral exudate, no LAN, TM clear  Cardiac - s1s2 regular, no murmur  Chest - prolonged exhalation, decreased breath sounds, no wheeze/rales/dullness  Abd - soft, nontender  Ext - no edema  Neuro - normal strength, CN intact  Psych - normal mood/behavior   Assessment/Plan:  COPD She is to continue as needed combivent.    Chest mass As expected no change in Rt upper lobe mass from prednisone.  Will need to arrange for ENB with Dr. Delton Coombes.  Respiratory failure with hypoxia Continue supplemental oxygen.  Back pain I have refilled her script for narco with no refills.  Advised her that she  will need to have this refilled in the future by her PCP or pain specialist.     Outpatient Encounter Prescriptions as of 03/19/2011  Medication Sig Dispense Refill  . albuterol (PROAIR HFA) 108 (90 BASE) MCG/ACT inhaler Inhale 2 puffs into the lungs every 6 (six) hours as needed.        Marland Kitchen albuterol-ipratropium (COMBIVENT) 18-103 MCG/ACT inhaler Inhale 2 puffs into the lungs every 6 (six) hours as needed.        . ALPRAZolam (XANAX) 1 MG tablet Take 1 mg by mouth 3 (three) times daily.        Marland Kitchen aspirin EC 325 MG tablet Take 325 mg by mouth daily.        . citalopram (CELEXA) 40 MG tablet Take 1 tablet by mouth at bedtime.      Marland Kitchen diltiazem (CARDIZEM CD) 240 MG 24 hr capsule Take 1 capsule (240 mg total) by mouth daily.  30 capsule  3  . famotidine (PEPCID) 20 MG tablet Take 20 mg by mouth 2 (two) times daily.        Marland Kitchen gemfibrozil (LOPID) 600 MG tablet Take 600 mg by mouth 2 (two) times daily before a meal.        . HYDROcodone-acetaminophen (VICODIN ES) 7.5-750 MG per tablet Take 1 tablet by mouth every 6 (six) hours as needed.        Marland Kitchen losartan (COZAAR) 50 MG tablet Take 50 mg by mouth 2 (two) times daily.        . metFORMIN (GLUCOPHAGE) 500 MG tablet Take 500 mg by mouth 2 (two) times daily with a meal.       . potassium chloride SA (K-DUR,KLOR-CON) 20 MEQ tablet Take 1 tablet (20 mEq total) by mouth 2 (two) times daily.  60 tablet  6  . predniSONE (DELTASONE) 20 MG tablet 1.5 pills for 3 days, 1 pill for 3 days, 0.5 pills for 3 days      . traMADol (ULTRAM) 50 MG tablet Take 50 mg by mouth every 4 (four) hours as needed.        . triamterene-hydrochlorothiazide (MAXZIDE-25) 37.5-25 MG per tablet Take 1 tablet by mouth daily.      Marland Kitchen DISCONTD: predniSONE (DELTASONE) 20 MG tablet Take 20 mg by mouth 2 (two) times daily.        Marland Kitchen  HYDROcodone-acetaminophen (NORCO) 7.5-325 MG per tablet Take 1 tablet by mouth every 6 (six) hours as needed for pain.  30 tablet  0  . DISCONTD: budesonide-formoterol  (SYMBICORT) 160-4.5 MCG/ACT inhaler Inhale 2 puffs into the lungs 2 (two) times daily.        Marland Kitchen DISCONTD: fluconazole (DIFLUCAN) 100 MG tablet Take 2 today then 1 daily until gone  7 tablet  0  . DISCONTD: lidocaine (LIDODERM) 5 % Place 1-3 patches onto the skin as needed. Remove & Discard patch within 12 hours or as directed by MD      . DISCONTD: omeprazole (PRILOSEC) 20 MG capsule Take 20 mg by mouth 2 (two) times daily.          Kelissa Merlin Pager:  [phone] 03/19/2011, 2:43 PM

## 2011-03-19 NOTE — Assessment & Plan Note (Signed)
Continue supplemental oxygen. 

## 2011-03-22 ENCOUNTER — Encounter: Payer: Self-pay | Admitting: Pulmonary Disease

## 2011-03-22 ENCOUNTER — Ambulatory Visit: Payer: Medicare Other | Admitting: Pulmonary Disease

## 2011-03-24 ENCOUNTER — Telehealth: Payer: Self-pay | Admitting: Pulmonary Disease

## 2011-03-24 DIAGNOSIS — R222 Localized swelling, mass and lump, trunk: Secondary | ICD-10-CM

## 2011-03-24 NOTE — Telephone Encounter (Signed)
Dr. Craige Cotta, pls advise if you have had a chance to speak with RB regarding EBUS for this patient. She is very anxious and says she is running out of her Prednisone and her chest hurts more on the lower dose.. Pt would like a callback tomorrow afternoon. Pls advise.

## 2011-03-25 NOTE — Telephone Encounter (Signed)
Please inform patient that I have discussed with Dr. Delton Coombes.  I have placed order for Super-D CT chest in preparation for ENB.    Will forward to Dr. Delton Coombes for his review.

## 2011-03-25 NOTE — Telephone Encounter (Signed)
Pt is aware and states she is scheduled to have super-d ct done on Monday.

## 2011-03-26 ENCOUNTER — Encounter (HOSPITAL_COMMUNITY): Payer: Self-pay | Admitting: Pharmacy Technician

## 2011-03-26 ENCOUNTER — Telehealth: Payer: Self-pay | Admitting: Emergency Medicine

## 2011-03-26 NOTE — Telephone Encounter (Signed)
Will sign off and forward to Dr Delton Coombes again as Lorain Childes.

## 2011-03-29 ENCOUNTER — Ambulatory Visit (INDEPENDENT_AMBULATORY_CARE_PROVIDER_SITE_OTHER)
Admission: RE | Admit: 2011-03-29 | Discharge: 2011-03-29 | Disposition: A | Discharge status: Home / Self Care | Payer: Medicare Other | Source: Ambulatory Visit | Attending: Pulmonary Disease | Admitting: Pulmonary Disease

## 2011-03-29 DIAGNOSIS — R222 Localized swelling, mass and lump, trunk: Secondary | ICD-10-CM

## 2011-03-29 NOTE — Telephone Encounter (Signed)
Per pt chart, pt scheduled for bronchoscopy on 12.19.12 per the 12.12.12 phone note.  Also per that note, pt is aware.    LMOM TCB x1.

## 2011-03-29 NOTE — Telephone Encounter (Signed)
Pt returned call.  She had questions about her bronch on 12.19.12: how do you spell the procedure, what time should she arrive, and what is going to be done tomorrow 12.18.12?  Spelled the procedure for pt, advised her to arrive 1 hour prior to the bronch, and that labs are scheduled for tomorrow.  Pt verbalized her understanding.  Nothing further needed.

## 2011-03-30 ENCOUNTER — Ambulatory Visit (HOSPITAL_COMMUNITY)
Admission: RE | Admit: 2011-03-30 | Discharge: 2011-03-30 | Disposition: A | Discharge status: Home / Self Care | Payer: Medicare Other | Source: Ambulatory Visit | Attending: Emergency Medicine | Admitting: Emergency Medicine

## 2011-03-30 ENCOUNTER — Encounter (HOSPITAL_COMMUNITY): Payer: Self-pay

## 2011-03-30 ENCOUNTER — Encounter (HOSPITAL_COMMUNITY)
Admission: RE | Admit: 2011-03-30 | Discharge: 2011-03-30 | Disposition: A | Discharge status: Home / Self Care | Payer: Medicare Other | Source: Ambulatory Visit | Attending: Emergency Medicine | Admitting: Emergency Medicine

## 2011-03-30 HISTORY — DX: Adverse effect of unspecified anesthetic, initial encounter: T41.45XA

## 2011-03-30 HISTORY — DX: Other complications of anesthesia, initial encounter: T88.59XA

## 2011-03-30 LAB — COMPREHENSIVE METABOLIC PANEL
Alkaline Phosphatase: 66 U/L (ref 39–117)
BUN: 11 mg/dL (ref 6–23)
CO2: 29 mEq/L (ref 19–32)
Chloride: 98 mEq/L (ref 96–112)
Creatinine, Ser: 0.92 mg/dL (ref 0.50–1.10)
GFR calc Af Amer: 73 mL/min — ABNORMAL LOW (ref >90)
GFR calc non Af Amer: 63 mL/min — ABNORMAL LOW (ref >90)
Glucose, Bld: 112 mg/dL — ABNORMAL HIGH (ref 70–99)
Total Bilirubin: 0.6 mg/dL (ref 0.3–1.2)

## 2011-03-30 LAB — CBC
HCT: 30.7 % — ABNORMAL LOW (ref 36.0–46.0)
Hemoglobin: 9.3 g/dL — ABNORMAL LOW (ref 12.0–15.0)
MCV: 80.2 fL (ref 78.0–100.0)
RBC: 3.83 MIL/uL — ABNORMAL LOW (ref 3.87–5.11)
RDW: 16.9 % — ABNORMAL HIGH (ref 11.5–15.5)
WBC: 8.8 10*3/uL (ref 4.0–10.5)

## 2011-03-30 LAB — PROTIME-INR
INR: 1.01 (ref 0.00–1.49)
Prothrombin Time: 13.5 seconds (ref 11.6–15.2)

## 2011-03-30 NOTE — Progress Notes (Addendum)
Revonda Standard, PA notified of lung history. Pt reports that she is suppose to be using home oxygen therapy and using it when she leaves home however she reports that she cannot carry the portable oxygen provided by [facility]. Pt reports that she is on a waiting list but that she is not carrying the oxygen away home since she in unable to carry the weight or when she does use it her home health aid is with her and is carrying it for her. Pt presents without oxygen and used a wheelchair to get from place to place. Call made to [facility] with Care management regarding this concern. Aundra Millet called back and reports that she spoke to an onsite [facility] care rep and they would attempt to get request moved up. Pt notified of same. Preop labs reviewed.

## 2011-03-30 NOTE — Pre-Procedure Instructions (Signed)
20 VEATRICE ECKSTEIN  03/30/2011   Your procedure is scheduled on:  March 31, 2011  Report to [facility] at 07:30 AM.  Call this number if you have problems the morning of surgery: [phone]   Remember:   Do not eat food:After Midnight.  May have clear liquids: up to 4 Hours before arrival.  Clear liquids include soda, tea, black coffee, apple or grape juice, broth.  Take these medicines the morning of surgery with A SIP OF WATER: Albuterol, Xanax, Diltazem, Pepcid, hydrocodone   Do not wear jewelry, make-up or nail polish.  Do not wear lotions, powders, or perfumes. You may wear deodorant.  Do not shave 48 hours prior to surgery.  Do not bring valuables to the hospital.  Contacts, dentures or bridgework may not be worn into surgery.  Leave suitcase in the car. After surgery it may be brought to your room.  For patients admitted to the hospital, checkout time is 11:00 AM the day of discharge.   Patients discharged the day of surgery will not be allowed to drive home.  Name and phone number of your driver: Linward Foster  Special Instructions: CHG Shower Use Special Wash: 1/2 bottle night before surgery and 1/2 bottle morning of surgery.   Please read over the following fact sheets that you were given: Pain Booklet, Coughing and Deep Breathing and Surgical Site Infection Prevention

## 2011-03-31 ENCOUNTER — Other Ambulatory Visit: Payer: Self-pay | Admitting: Emergency Medicine

## 2011-03-31 ENCOUNTER — Encounter (HOSPITAL_COMMUNITY): Payer: Self-pay | Admitting: Anesthesiology

## 2011-03-31 ENCOUNTER — Ambulatory Visit (HOSPITAL_COMMUNITY): Payer: Medicare Other | Admitting: Anesthesiology

## 2011-03-31 ENCOUNTER — Encounter (HOSPITAL_COMMUNITY): Payer: Self-pay | Admitting: *Deleted

## 2011-03-31 ENCOUNTER — Telehealth: Payer: Self-pay | Admitting: Pulmonary Disease

## 2011-03-31 ENCOUNTER — Encounter (HOSPITAL_COMMUNITY)
Admission: RE | Disposition: A | Discharge status: Home / Self Care | Payer: Self-pay | Source: Ambulatory Visit | Attending: Emergency Medicine

## 2011-03-31 ENCOUNTER — Ambulatory Visit (HOSPITAL_COMMUNITY)
Admission: RE | Admit: 2011-03-31 | Discharge: 2011-03-31 | Disposition: A | Discharge status: Home / Self Care | Payer: Medicare Other | Source: Ambulatory Visit | Attending: Emergency Medicine | Admitting: Emergency Medicine

## 2011-03-31 ENCOUNTER — Ambulatory Visit (HOSPITAL_COMMUNITY): Payer: Medicare Other

## 2011-03-31 DIAGNOSIS — R222 Localized swelling, mass and lump, trunk: Secondary | ICD-10-CM

## 2011-03-31 DIAGNOSIS — R0902 Hypoxemia: Secondary | ICD-10-CM

## 2011-03-31 DIAGNOSIS — C3490 Malignant neoplasm of unspecified part of unspecified bronchus or lung: Secondary | ICD-10-CM

## 2011-03-31 DIAGNOSIS — Z01818 Encounter for other preprocedural examination: Secondary | ICD-10-CM | POA: Insufficient documentation

## 2011-03-31 DIAGNOSIS — J449 Chronic obstructive pulmonary disease, unspecified: Secondary | ICD-10-CM

## 2011-03-31 DIAGNOSIS — Z01812 Encounter for preprocedural laboratory examination: Secondary | ICD-10-CM | POA: Insufficient documentation

## 2011-03-31 HISTORY — DX: Malignant neoplasm of unspecified part of unspecified bronchus or lung: C34.90

## 2011-03-31 SURGERY — VIDEO BRONCHOSCOPY WITH ENDOBRONCHIAL NAVIGATION
Anesthesia: General | Wound class: Clean Contaminated

## 2011-03-31 MED ORDER — ONDANSETRON HCL 4 MG/2ML IJ SOLN
INTRAMUSCULAR | Status: DC | PRN
  Administered 2011-03-31: 4 mg via INTRAVENOUS

## 2011-03-31 MED ORDER — ROCURONIUM BROMIDE 100 MG/10ML IV SOLN
INTRAVENOUS | Status: DC | PRN
  Administered 2011-03-31: 40 mg via INTRAVENOUS

## 2011-03-31 MED ORDER — HYDROMORPHONE HCL PF 1 MG/ML IJ SOLN
0.2500 mg | INTRAMUSCULAR | Status: DC | PRN
  Administered 2011-03-31: 0.25 mg via INTRAVENOUS
  Administered 2011-03-31: 0.5 mg via INTRAVENOUS

## 2011-03-31 MED ORDER — DEXTROSE 5 % IV SOLN
10.0000 mg | INTRAVENOUS | Status: DC | PRN
  Administered 2011-03-31: 10 ug/min via INTRAVENOUS

## 2011-03-31 MED ORDER — FENTANYL CITRATE 0.05 MG/ML IJ SOLN
INTRAMUSCULAR | Status: DC | PRN
  Administered 2011-03-31 (×2): 50 ug via INTRAVENOUS

## 2011-03-31 MED ORDER — NEOSTIGMINE METHYLSULFATE 1 MG/ML IJ SOLN
INTRAMUSCULAR | Status: DC | PRN
  Administered 2011-03-31: 4 mg via INTRAVENOUS

## 2011-03-31 MED ORDER — LACTATED RINGERS IV SOLN
INTRAVENOUS | Status: DC | PRN
  Administered 2011-03-31: 11:00:00 via INTRAVENOUS

## 2011-03-31 MED ORDER — PROPOFOL 10 MG/ML IV EMUL
INTRAVENOUS | Status: DC | PRN
  Administered 2011-03-31: 160 mg via INTRAVENOUS

## 2011-03-31 MED ORDER — MIDAZOLAM HCL 5 MG/5ML IJ SOLN
INTRAMUSCULAR | Status: DC | PRN
  Administered 2011-03-31: 1 mg via INTRAVENOUS

## 2011-03-31 MED ORDER — LACTATED RINGERS IV SOLN
INTRAVENOUS | Status: DC
  Administered 2011-03-31 (×2): via INTRAVENOUS

## 2011-03-31 MED ORDER — GLYCOPYRROLATE 0.2 MG/ML IJ SOLN
INTRAMUSCULAR | Status: DC | PRN
  Administered 2011-03-31: .6 mg via INTRAVENOUS

## 2011-03-31 MED ORDER — ONDANSETRON HCL 4 MG/2ML IJ SOLN: 4.0000 mg | Freq: Four times a day (QID) | INTRAMUSCULAR | Status: DC | PRN

## 2011-03-31 MED ORDER — 0.9 % SODIUM CHLORIDE (POUR BTL) OPTIME
TOPICAL | Status: DC | PRN
  Administered 2011-03-31: 1000 mL

## 2011-03-31 SURGICAL SUPPLY — 32 items
BRUSH SUPERTRAX BIOPSY (INSTRUMENTS) IMPLANT
BRUSH SUPERTRAX NDL-TIP CYTO (INSTRUMENTS) ×1 IMPLANT
CANISTER SUCTION 2500CC (MISCELLANEOUS) ×2 IMPLANT
CHANNEL WORK EXTEND EDGE 180 (KITS) ×1 IMPLANT
CHANNEL WORK EXTEND EDGE 45 (KITS) IMPLANT
CHANNEL WORK EXTEND EDGE 90 (KITS) IMPLANT
CLOTH BEACON ORANGE TIMEOUT ST (SAFETY) ×2 IMPLANT
CONT SPEC 4OZ CLIKSEAL STRL BL (MISCELLANEOUS) ×2 IMPLANT
COVER TABLE BACK 60X90 (DRAPES) ×2 IMPLANT
FILTER STRAW FLUID ASPIR (MISCELLANEOUS) IMPLANT
FORCEPS BIOP SUPERTRX PREMAR (INSTRUMENTS) ×1 IMPLANT
GLOVE BIO SURGEON STRL SZ7 (GLOVE) ×3 IMPLANT
GLOVE SKINSENSE NS SZ7.5 (GLOVE) ×1
GLOVE SKINSENSE STRL SZ7.5 (GLOVE) IMPLANT
KIT LOCATABLE GUIDE (CANNULA) IMPLANT
KIT MARKER FIDUCIAL DELIVERY (KITS) IMPLANT
KIT PROCEDURE EDGE 180 (KITS) IMPLANT
KIT PROCEDURE EDGE 45 (KITS) IMPLANT
KIT PROCEDURE EDGE 90 (KITS) ×1 IMPLANT
KIT ROOM TURNOVER OR (KITS) ×2 IMPLANT
MARKER SKIN DUAL TIP RULER LAB (MISCELLANEOUS) ×2 IMPLANT
NDL SUPERTRX PREMARK BIOPSY (NEEDLE) IMPLANT
NEEDLE SUPERTRX PREMARK BIOPSY (NEEDLE) ×2 IMPLANT
NS IRRIG 1000ML POUR BTL (IV SOLUTION) ×2 IMPLANT
OIL SILICONE PENTAX (PARTS (SERVICE/REPAIRS)) ×2 IMPLANT
PAD ARMBOARD 7.5X6 YLW CONV (MISCELLANEOUS) ×4 IMPLANT
PATCHES PATIENT (LABEL) ×2 IMPLANT
SPONGE GAUZE 4X4 12PLY (GAUZE/BANDAGES/DRESSINGS) ×2 IMPLANT
SYR 20ML ECCENTRIC (SYRINGE) ×2 IMPLANT
TOWEL OR 17X24 6PK STRL BLUE (TOWEL DISPOSABLE) ×2 IMPLANT
TRAP SPECIMEN MUCOUS 40CC (MISCELLANEOUS) ×2 IMPLANT
TUBE CONNECTING 12X1/4 (SUCTIONS) ×2 IMPLANT

## 2011-03-31 NOTE — Interval H&P Note (Signed)
Interval Hx:   Pleasant 65 yo woman followed by Dr Craige Cotta and seen by me in [facility] for acute bronchitis. 03/02/11. After rx for acute bronchitis and with prednisone for possible inflammatory cause for her RUL lesion, repeat imaging showed no change in the RUL mass. SuperD CT scan done 12/17 confirms its presence and location. She describes 3 months of progressive dyspnea, some episodic nocturnal awakenings with SOB and wheeze. Otherwise no new complaints. She has not seen any more hemoptysis since rx for bronchitis in November.   Exam:  Obese woman in NAD, no wheezing or crackles on lung exam, no edema. No significant changes from priors.   CT chest 03/29/11: IMPRESSION:  1. Right upper lobe and right middle lobe masses are stable from  02/02/2011, but new from 05/09/2003. 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  2. Cholelithiasis.  Plan:  Will proceed with FOB + ENB and biopsies today as planned.   Levy Pupa, MD, PhD 03/31/2011, 10:40 AM [facility] [phone] or if no answer [phone]

## 2011-03-31 NOTE — Anesthesia Preprocedure Evaluation (Addendum)
Anesthesia Evaluation  Patient identified by MRN, date of birth, ID band Patient awake    Reviewed: Allergy & Precautions, H&P , NPO status , Patient's Chart, lab work & pertinent test results, reviewed documented beta blocker date and time   Airway Mallampati: II TM Distance: >3 FB Neck ROM: full    Dental  (+) Loose   Pulmonary asthma , COPD oxygen dependent, former smoker         Cardiovascular hypertension, + CAD     Neuro/Psych    GI/Hepatic GERD-  Medicated and Poorly Controlled,  Endo/Other  Diabetes mellitus-, Poorly Controlled, Type 2, Oral Hypoglycemic Agents  Renal/GU      Musculoskeletal   Abdominal   Peds  Hematology   Anesthesia Other Findings Two loose teeth lower:  Mid and mid-left.  Small mouth.  Reproductive/Obstetrics                        Anesthesia Physical Anesthesia Plan  ASA: III  Anesthesia Plan: General   Post-op Pain Management:    Induction: Intravenous  Airway Management Planned: Oral ETT  Additional Equipment:   Intra-op Plan:   Post-operative Plan: Extubation in OR  Informed Consent: I have reviewed the patients History and Physical, chart, labs and discussed the procedure including the risks, benefits and alternatives for the proposed anesthesia with the patient or authorized representative who has indicated his/her understanding and acceptance.     Plan Discussed with: CRNA and Surgeon  Anesthesia Plan Comments:         Anesthesia Quick Evaluation

## 2011-03-31 NOTE — Telephone Encounter (Signed)
Please inform patient that we will call to discuss pathology results when these are available (likely by Friday of this week).  They will not need to wait until follow up scheduled in January to get results.

## 2011-03-31 NOTE — Anesthesia Procedure Notes (Signed)
Procedure Name: Intubation Date/Time: 03/31/2011 11:25 AM Performed by: Darcey Nora Pre-anesthesia Checklist: Patient identified, Emergency Drugs available, Suction available and Patient being monitored Patient Re-evaluated:Patient Re-evaluated prior to inductionOxygen Delivery Method: Circle System Utilized Preoxygenation: Pre-oxygenation with 100% oxygen Intubation Type: IV induction Ventilation: Mask ventilation without difficulty Laryngoscope Size: Mac and 3 Grade View: Grade II Tube type: Oral Tube size: 8.5 mm Number of attempts: 1 Placement Confirmation: ETT inserted through vocal cords under direct vision,  positive ETCO2 and breath sounds checked- equal and bilateral Secured at: 21 cm Tube secured with: Tape Dental Injury: Teeth and Oropharynx as per pre-operative assessment  Comments: Saw only arytenoids then lower half of tiny cords.

## 2011-03-31 NOTE — Progress Notes (Addendum)
Pt placed on dose of home oxygen of 2 lpm for shortness of breath. Shortness of breath resolved with application of oxygen.

## 2011-03-31 NOTE — Op Note (Signed)
Video Bronchoscopy with Electromagnetic Navigation Procedure Note  Date of Operation: 03/31/2011  Pre-op Diagnosis: RUL mass and RML ground glass infiltrate  Post-op Diagnosis: same  Surgeon: Levy Pupa  Assistants: none  Anesthesia: General endotracheal anesthesia  Operation: Flexible video fiberoptic bronchoscopy with electromagnetic navigation and biopsies.  Estimated Blood Loss: Minimal  Complications: none apparent  Indications and History: [name] is a 65 y.o. female with RUL mass and RML GGI on CT scan of the chest, positive on PET from 11/12. In consultation w Dr Craige Cotta, we decided to pursue bx via Electromagnetic Navigation.  The risks, benefits, complications, treatment options and expected outcomes were discussed with the patient.  The possibilities of pneumothorax, pneumonia, reaction to medication, pulmonary aspiration, perforation of a viscus, bleeding, failure to diagnose a condition and creating a complication requiring transfusion or operation were discussed with the patient who freely signed the consent.    Description of Procedure: The patient was seen in the Preoperative Area, was examined and was deemed appropriate to proceed.  The patient was taken to OR7, identified as Alycia Rossetti and the procedure verified as Flexible Video Fiberoptic Bronchoscopy.  A Time Out was held and the above information confirmed.   Prior to the date of the procedure a high-resolution CT scan of the chest was performed. Utilizing superDimension software a virtual tracheobronchial tree was generated to allow the creation of distinct navigation pathways to the patient's right upper lobe mass and right middle lobe infiltrate. After being taken to the operating room general anesthesia was initiated and the patient  was orally intubated. The video fiberoptic bronchoscope was introduced via the endotracheal tube and a general inspection was performed which showed a possible  endobronchial lesion in the right upper lobe lateral segment. This was biopsied with endobronchial biopsy forceps. The extendable working channel and locator guide were introduced into the bronchoscope. The distinct navigation pathways prepared prior to this procedure were then utilized to navigate to within 1.5cm of patient's right upper lobe and right middle lobe lesion identified on CT scan. The extendable working channel was secured into place and the locator guide was withdrawn. Under fluoroscopic guidance transbronchial needle brushings, transbronchial Wang needle biopsies, and transbronchial forceps biopsies in the right upper lobe, and transbronchial needle brushings with transbronchial forceps biopsies were performed in the right middle lobe to be sent for cytology and pathology.  At the end of the procedure a general airway inspection was performed and there was no evidence of active bleeding. The bronchoscope was removed.  The patient tolerated the procedure well. There was no significant blood loss and there were no obvious complications. A post-procedural chest x-ray is pending.  Samples: 1. Transbronchial needle brushings from RUL mass and RML infiltrate 2. Transbronchial Wang needle biopsies from RUL mass 3. Transbronchial forceps biopsies from RUL mass and RML infiltrate 4. Bronchial washings from RUL 5. Endobronchial biopsies from RUL lateral segmental airway  Plans:  The patient will be discharged from the PACU to home when recovered from anesthesia and after chest x-ray is reviewed. We will review the cytology, pathology and microbiology results with the patient when they become available. Outpatient followup will be with Dr Seth Fry. Sood at [facility].    Levy Pupa, MD, PhD 03/31/2011, 1:02 PM [facility] [phone] or if no answer [phone]

## 2011-03-31 NOTE — Transfer of Care (Signed)
Immediate Anesthesia Transfer of Care Note  Patient: [name]  Procedure(s) Performed:  VIDEO BRONCHOSCOPY WITH ENDOBRONCHIAL NAVIGATION  Patient Location: PACU  Anesthesia Type: General  Level of Consciousness: awake, alert , oriented and patient cooperative  Airway & Oxygen Therapy: Patient Spontanous Breathing and Patient connected to nasal cannula oxygen  Post-op Assessment: Report given to PACU RN, Post -op Vital signs reviewed and stable and Patient moving all extremities  Post vital signs: Reviewed and stable  Complications: No apparent anesthesia complications

## 2011-03-31 NOTE — H&P (View-Only) (Signed)
64 yo female former smoker with GOLD 2 COPD and prior hx of NSCLC, seen in 10/12 by DR Sood. Underwent FOB for RUL mass on CT chest from 02/02/11  Acute visit 03/02/11 -- Hx COPD, NSCLCA with new RUL mass, s/p fob by Dr Sood 11/8 - showed multi-nucleated giant cells, no malignancy. Treated with steroids. Presents now telling me that she has seen some blood streaked sputum, tailing off but has lasted longer than she expected. She is also having sore throat (? Symbicort, which is new), some congestion - sometimes, white, clear or dark brown. The blood is much less but sometimes pink sputum.  She is on pred to see if the RUL lesion will shrink.   Gen: Pleasant, obese, in no distress,  normal affect  ENT: No lesions,  mouth clear,  oropharynx clear, no postnasal drip  Neck: No JVD, no TMG, no carotid bruits  Lungs: No use of accessory muscles, no dullness to percussion, clear without rales or rhonchi  Cardiovascular: RRR, heart sounds normal, no murmur or gallops, no peripheral edema  Musculoskeletal: No deformities, no cyanosis or clubbing  Neuro: alert, non focal  Skin: Warm, no lesions or rashes  COPD Possible acute bronchitis - already on rx with steroids, believes she is seeing brown mucous, blood. She does have some epistaxis which could be source vs her RUL lesion.  - start doxy x 7 days - f/u dr sood to assess improvement both in these sx and also the size of the mass (see below)  Chest mass S/p FOB without malignancy seen 02/18/11. Started on steroids, but agree that this appears worrisome for malignancy. She will be re-imaged by Dr Sood after steroid trial, but I suspect that she will need another procedure, possibly FOB with ENB. i would be happy to assist w this if indicated.          

## 2011-03-31 NOTE — Anesthesia Postprocedure Evaluation (Signed)
Anesthesia Post Note  Patient: [name]  Procedure(s) Performed:  VIDEO BRONCHOSCOPY WITH ENDOBRONCHIAL NAVIGATION  Anesthesia type: General  Patient location: PACU  Post pain: Pain level controlled and Adequate analgesia  Post assessment: Post-op Vital signs reviewed, Patient's Cardiovascular Status Stable, Respiratory Function Stable, Patent Airway and Pain level controlled  Last Vitals:  Filed Vitals:   03/31/11 1440  BP: 156/52  Pulse: 82  Temp:   Resp: 19    Post vital signs: Reviewed and stable  Level of consciousness: awake, alert  and oriented  Complications: No apparent anesthesia complications

## 2011-03-31 NOTE — Preoperative (Signed)
Beta Blockers   Reason not to administer Beta Blockers:Not Applicable 

## 2011-03-31 NOTE — Telephone Encounter (Signed)
I spoke with pt son Jorja Loa and he states pt was made an apt with [facility] on 04/27/11 to review pt's pathology results. He states this is to far out. He states he was under the impression that they would be called with the results on Friday. They are wanting to get started on tx's as soon as possible if this is needed. Please advise Dr. Craige Cotta, thanks

## 2011-03-31 NOTE — Progress Notes (Signed)
Dr. Ladene Artist notified of pt's consumption of ice chips at 08:00

## 2011-04-01 ENCOUNTER — Telehealth: Payer: Self-pay | Admitting: Emergency Medicine

## 2011-04-01 NOTE — Telephone Encounter (Signed)
Pt returned call

## 2011-04-01 NOTE — Telephone Encounter (Signed)
Called and spoke with pt. Informed her of [facility]'s response.  Pt states she is very anxious and would like these results asap before Christmas if possible.  I informed pt that [facility] will call her once the results are back.  Pt was ok with this.

## 2011-04-01 NOTE — Telephone Encounter (Signed)
ATC pt phone rings busy x 3. Wcb

## 2011-04-01 NOTE — Telephone Encounter (Signed)
Duplicate msg.

## 2011-04-02 ENCOUNTER — Telehealth: Payer: Self-pay | Admitting: Pulmonary Disease

## 2011-04-02 ENCOUNTER — Encounter: Payer: Self-pay | Admitting: Pulmonary Disease

## 2011-04-02 NOTE — Telephone Encounter (Signed)
I called pt and she is scheduled to have PFT done 04/19/10 at 10:00. Pt aware not to use her inhalers prior to her apt. She voiced her understanding. Also pt is requesting to have an order sent to [facility] for her to get set up with portable oxygen. Pt states the tanks are to heavy for her to carry around. Please advise if okay to send order Dr. Craige Cotta, thanks

## 2011-04-02 NOTE — Telephone Encounter (Signed)
Dr. Craige Cotta has already discussed this with her. See previous phone note

## 2011-04-02 NOTE — Telephone Encounter (Signed)
Discussed biopsy results with patient.  Explained that biopsy from Rt upper lobe lesion is positive for NSCLC (Adenocarcinoma).  Will proceed with scheduling PFT to assess lung function, and have my nurse schedule this.    Explained that she may not be candidate for surgical intervention given prior history of lung resection and COPD with chronic hypoxic respiratory failure.  Will also have [facility] arrange for patient to be evaluated with oncology at multi-disciplinary thoracic oncology conference [facility]).  Also advised pt that is would okay to resume taking aspirin.

## 2011-04-03 NOTE — Telephone Encounter (Signed)
Ok to send order for portable oxygen.

## 2011-04-05 NOTE — Telephone Encounter (Signed)
Order has been sent

## 2011-04-07 ENCOUNTER — Telehealth: Payer: Self-pay | Admitting: Pulmonary Disease

## 2011-04-07 NOTE — Telephone Encounter (Signed)
I spoke with pt and she states she can't remember if Dr. Craige Cotta was the one who told her that her iron level was low. Pt states she has been feeling tired and wants to know if she should start iron. Pt states she is scared to start it w/o the physician's okay. Please advise Dr. Craige Cotta, thanks

## 2011-04-08 ENCOUNTER — Encounter: Payer: Self-pay | Admitting: *Deleted

## 2011-04-08 NOTE — Telephone Encounter (Signed)
Please inform patient that last blood test from 03/30/11 did show evidence for anemia.  However, many things can cause anemia besides iron deficiency.  She should not start taking iron supplementation yet.  She is due to see hematology/oncology for her lung cancer, and her anemia can be addressed further with hematology/oncology.  Lab Results  Component Value Date   WBC 8.8 03/30/2011   HGB 9.3* 03/30/2011   HCT 30.7* 03/30/2011   MCV 80.2 03/30/2011   PLT 261 03/30/2011

## 2011-04-08 NOTE — Progress Notes (Signed)
Called and spoke with pt regarding appt at [facility] 04/15/11 at 2:00.  Pt verbalized understanding of time and location.

## 2011-04-08 NOTE — Telephone Encounter (Signed)
Pt is aware of VS response. She voiced her understanding and needed nothing further

## 2011-04-15 ENCOUNTER — Ambulatory Visit (HOSPITAL_BASED_OUTPATIENT_CLINIC_OR_DEPARTMENT_OTHER): Payer: Medicare Other | Admitting: Internal Medicine

## 2011-04-15 ENCOUNTER — Ambulatory Visit
Admission: RE | Admit: 2011-04-15 | Discharge: 2011-04-15 | Disposition: A | Discharge status: Home / Self Care | Payer: Medicare Other | Source: Ambulatory Visit | Attending: Radiation Oncology | Admitting: Radiation Oncology

## 2011-04-15 ENCOUNTER — Encounter: Payer: Self-pay | Admitting: Internal Medicine

## 2011-04-15 VITALS — BP 137/51 | HR 85 | Temp 97.8°F | Resp 18 | Ht 66.0 in | Wt 180.0 lb

## 2011-04-15 DIAGNOSIS — C342 Malignant neoplasm of middle lobe, bronchus or lung: Secondary | ICD-10-CM | POA: Diagnosis not present

## 2011-04-15 DIAGNOSIS — Z510 Encounter for antineoplastic radiation therapy: Secondary | ICD-10-CM | POA: Insufficient documentation

## 2011-04-15 DIAGNOSIS — I2510 Atherosclerotic heart disease of native coronary artery without angina pectoris: Secondary | ICD-10-CM | POA: Insufficient documentation

## 2011-04-15 DIAGNOSIS — J449 Chronic obstructive pulmonary disease, unspecified: Secondary | ICD-10-CM | POA: Insufficient documentation

## 2011-04-15 DIAGNOSIS — C3490 Malignant neoplasm of unspecified part of unspecified bronchus or lung: Secondary | ICD-10-CM | POA: Diagnosis not present

## 2011-04-15 DIAGNOSIS — J4489 Other specified chronic obstructive pulmonary disease: Secondary | ICD-10-CM | POA: Insufficient documentation

## 2011-04-15 DIAGNOSIS — E119 Type 2 diabetes mellitus without complications: Secondary | ICD-10-CM | POA: Insufficient documentation

## 2011-04-15 DIAGNOSIS — K219 Gastro-esophageal reflux disease without esophagitis: Secondary | ICD-10-CM | POA: Insufficient documentation

## 2011-04-15 DIAGNOSIS — Z79899 Other long term (current) drug therapy: Secondary | ICD-10-CM | POA: Insufficient documentation

## 2011-04-15 NOTE — Progress Notes (Signed)
REASON FOR CONSULTATION:  66 years old white female diagnosed with lung cancer.  HPI [name] is a 66 y.o. female was past medical history significant for COPD, diabetes mellitus, diabetic neuropathy, hypertension, dyslipidemia, history of coronary heart disease, GERD and anemia. The patient also has a long history of smoking but she quit in 2000. The patient mentions that since the end of July 2002 and she has been complaining of increasing shortness of breath, chest congestion and cough. She was seen by Dr. Craige Cotta at that time and chest x-ray was unremarkable. She was referred to her credit is Dr. Excell Seltzer and cardiac workup was negative. A cardiac MRI was performed on 01/27/2011. It showed Suspicious 3.5cm lesion in the RUL concerning for lung caner. This was followed by CT scan of the chest performed on 02/02/2011 and it showed that within the right posterior apical segment of the right upper lobe, there is a spiculated mass measuring 3.3 CM, suspicious for bronchogenic neoplasm. There was also a geographic area of ground glass density was in the right middle lobe questionable for an infectious process but neoplasm is not excluded. There was also a faint nodule identified within the left upper lobe measuring 8 mm. Small prevascular, pretracheal and precarinal lymph nodes, the largest is seen was in the precarinal region and measured 2.0 x 1.1 CM in diameter. The patient underwent a video bronchoscopy under the care of Dr. Craige Cotta, but the pathology was not conclusive for malignancy. On 03/31/2011, the patient underwent a video bronchoscopy was electromagnetic navigation under the care Dr. Delton Coombes. The biopsy of the right upper lobe was consistent with adenocarcinoma. PET scan on 02/12/2011 showed Spiculated nodule medially in the posterior segment right upper lobe demonstrates central cavitation and measures 3.9 x 2.9  cm. This is hypermetabolic with an SUV max of 18.0. There is a persistent focal  ground-glass opacity inferiorly in the right middle lobe, measuring approximately 2.8 cm on image 87. This is unchanged from the recent CT and shows mildly increased metabolic activity with an SUV max of 4.4. No other hypermetabolic pulmonary lesions are identified. There is no hypermetabolic nodal activity within the neck, chest, abdomen or pelvis. Dr. Delton Coombes kindly referred the patient to me today for evaluation and recommendation regarding her treatment. The patient was seen at the multidisciplinary thoracic oncology clinic [facility]). She was accompanied by her daughter [name]. The patient is currently on disability but used to work as a Social worker. She has a history of left upper lobectomy performed at age 32 for early-stage lung cancer. Her main complaints today are fatigue, loss of appetite and constipation. She denied having any significant chest pain or shortness of breath. No weight loss and no visual changes.   @SFHPI @  Past Medical History  Diagnosis Date  . Polyneuropathy in diabetes   . Restless legs syndrome (RLS)   . Chronic sinusitis   . Hypertension   . Hyperlipidemia   . Type II or unspecified type diabetes mellitus without mention of complication, not stated as uncontrolled   . Anemia, unspecified   . GERD (gastroesophageal reflux disease)   . Respiratory failure with hypoxia 01/02/2010  . Complication of anesthesia     slow to wake up  . Adenocarcinoma, lung     upper left lobe  . COPD (chronic obstructive pulmonary disease)   . Emphysema   . Asthma   . Coronary artery disease     Dr. Excell Seltzer had stress test done  . Dysrhythmia  unsure what type  . Peripheral vascular disease   . Non-small cell lung cancer 03/31/2011    Adenocarcinoma Rt upper lobe mass    Past Surgical History  Procedure Date  . Lung lobectomy     left  . Septoplasty   . Total hip arthroplasty 01/2010    left  . Bronchoscopy 02/2011    Family History  Problem Relation Age of Onset  .  Bone cancer Father   . Diabetes Mother     Social History History  Substance Use Topics  . Smoking status: Former Smoker -- 1.0 packs/day for 40 years    Types: Cigarettes    Quit date: 102/23/2000  . Smokeless tobacco: Never Used  . Alcohol Use: No     quit 1999    Allergies  Allergen Reactions  . Iohexol Shortness Of Breath and Other (See Comments)    Burning of skin  . Codeine     REACTION: GI upset  . Penicillins     REACTION: itching    Current Outpatient Prescriptions  Medication Sig Dispense Refill  . albuterol (PROAIR HFA) 108 (90 BASE) MCG/ACT inhaler Inhale 2 puffs into the lungs every 6 (six) hours as needed. For shortness of breath      . albuterol-ipratropium (COMBIVENT) 18-103 MCG/ACT inhaler Inhale 2 puffs into the lungs every 6 (six) hours as needed. For shortness of breath      . ALPRAZolam (XANAX) 1 MG tablet Take 1 mg by mouth 3 (three) times daily as needed. For anxiety      . aspirin EC 325 MG tablet Take 325 mg by mouth daily.        . calcium carbonate 200 MG capsule Take 1,000 mg by mouth daily. Does not take everyday       . cholecalciferol (VITAMIN D) 1000 UNITS tablet Take 1,000 Units by mouth daily. Dose varies and pt does not take every day       . citalopram (CELEXA) 40 MG tablet Take 1 tablet by mouth at bedtime.      Marland Kitchen diltiazem (CARDIZEM CD) 240 MG 24 hr capsule Take 1 capsule (240 mg total) by mouth daily.  30 capsule  3  . famotidine (PEPCID) 20 MG tablet Take 20 mg by mouth 2 (two) times daily.        Marland Kitchen gemfibrozil (LOPID) 600 MG tablet Take 600 mg by mouth 2 (two) times daily before a meal.        . HYDROcodone-acetaminophen (VICODIN ES) 7.5-750 MG per tablet Take 1 tablet by mouth every 6 (six) hours as needed. For severe pain      . lidocaine (LIDODERM) 5 % Place 1 patch onto the skin daily as needed. For Pain. Remove & Discard patch within 12 hours or as directed by MD       . losartan (COZAAR) 50 MG tablet Take 50 mg by mouth 2 (two)  times daily.        . metFORMIN (GLUCOPHAGE) 500 MG tablet Take 500 mg by mouth 2 (two) times daily with a meal.       . potassium chloride SA (K-DUR,KLOR-CON) 20 MEQ tablet Take 1 tablet (20 mEq total) by mouth 2 (two) times daily.  60 tablet  6  . traMADol (ULTRAM) 50 MG tablet Take 50 mg by mouth every 4 (four) hours as needed. For moderate pain      . triamterene-hydrochlorothiazide (MAXZIDE-25) 37.5-25 MG per tablet Take 1 tablet by mouth daily.  Review of Systems  A comprehensive review of systems was negative except for: Constitutional: positive for fatigue Gastrointestinal: positive for constipation  Physical Exam  ZOX:WRUEA, healthy, no distress, well nourished and well developed SKIN: skin color, texture, turgor are normal HEAD: Normocephalic EYES: normal EARS: External ears normal OROPHARYNX:no exudate, no erythema and lips, buccal mucosa, and tongue normal  NECK: supple, no adenopathy LYMPH:  no palpable lymphadenopathy, no hepatosplenomegaly BREAST:not examined LUNGS: clear to auscultation  HEART: regular rate & rhythm, no murmurs and no gallops ABDOMEN:abdomen soft, non-tender, normal bowel sounds and no masses or organomegaly BACK: Back symmetric, no curvature. EXTREMITIES:no joint deformities, effusion, or inflammation, no edema, no skin discoloration, no clubbing, no cyanosis  NEURO: alert & oriented x 3 with fluent speech, no focal motor/sensory deficits, gait normal    Studies/Results: Dg Chest 2 View Within Previous 72 Hours.  Films Obtained On Friday Are Acceptable For Monday And Tuesday Cases  03/30/2011  *RADIOLOGY REPORT*  Clinical Data: Preoperative examination.  Shortness of breath. Worsening coughing and congestion for 2 months.  History of left lung carcinoma.  CHEST - 2 VIEW  Comparison: 03/19/2011 study.  03/29/2011 CT.  Findings: Spiculated density in the medial aspect of the right upper lobe posteriorly is unchanged.  There is surrounding  patchy infiltrative density.  There is slight volume loss in the right lung base.  There is evidence of previous left sided surgery with surgical clips seen in the left hilar region.  No left pulmonary infiltrative densities are seen.  No pleural effusions are evident. There is slightly osteopenic appearance of bones. Cardiac silhouette upper normal size.  Slight scoliosis.  IMPRESSION: Posteromedial mass in the right upper lobe appears unchanged. There is slight patchy infiltrative density around the mass. Left lung free of infiltrates. No acute superimposed infiltrates evident.  Original Report Authenticated By: Crawford Givens, M.D.   Dg Chest 2 View  03/19/2011  *RADIOLOGY REPORT*  Clinical Data: Lung cancer, follow-up mass  CHEST - 2 VIEW  Comparison: [facility] chest radiographs dated 03/02/2011, [facility] PET CT dated 02/12/2011  Findings: Stable posteromedial right upper lobe mass, compatible with bronchogenic carcinoma when correlating with prior studies. Postsurgical changes in the left lung. No pleural effusion or pneumothorax.  Cardiomediastinal silhouette is within normal limits.  Mild degenerative changes of the visualized thoracolumbar spine.  IMPRESSION: Stable right upper lobe mass, compatible bronchogenic carcinoma.  Postsurgical changes in the left lung.  Original Report Authenticated By: Charline Bills, M.D.   Dg Chest Port 1 View  03/31/2011  *RADIOLOGY REPORT*  Clinical Data: Postop bronchoscopy  PORTABLE CHEST - 1 VIEW  Comparison: Chest x-ray of 03/30/2011  Findings: Abnormal opacity is again noted in the right suprahilar region.  No pneumothorax is seen.  No effusion is noted.  Heart size is stable.  Surgical clips overlying the left hilum.  IMPRESSION:  1.  No change in right suprahilar opacity. 2.  No pneumothorax.  Original Report Authenticated By: Juline Patch, M.D.   Dg Bronchi Uni  03/31/2011  CLINICAL DATA: video bronchoscopy with navigation   BRONCHO UNI FLUORO  Fluoroscopy  was utilized by the requesting physician.  No radiographic  interpretation.     Ct Super D Chest Wo Contrast  03/29/2011  *RADIOLOGY REPORT*  Clinical Data:  Right lung nodule.  Coughing.  History of lung cancer.  CT CHEST WITHOUT CONTRAST  Technique:  Multidetector CT imaging of the chest was performed using thin slice collimation for electromagnetic bronchoscopy planning purposes, without intravenous  contrast.  Comparison:  PET CT 02/12/2011.  CT chest 02/02/2011 and 05/09/2003.  Findings:  Mediastinal lymph nodes measure up to 9 mm in the precarinal station.  Hilar regions are difficult to definitively evaluate without IV contrast.  No axillary adenopathy. Atherosclerotic calcification of the arterial vasculature, including coronary arteries.  Heart size normal.  No pericardial effusion.  Centrilobular emphysema.  Spiculated right upper lobe mass measures 3.5 x 2.4 cm, stable from 02/02/2011 but new from 05/09/2003.  A ground-glass lesion in the right middle lobe measures approximately 4.0 x 4.2 cm, also stable from 02/02/2011, but new from 05/09/2003. Additional scattered nodular densities bilaterally small in size stable.  Postoperative changes of left upper lobectomy.  Airway is otherwise unremarkable.  Incidental imaging of the upper abdomen shows small stones in the gallbladder.  No worrisome lytic or sclerotic lesions.  IMPRESSION:  1.  Right upper lobe and right middle lobe masses are stable from 02/02/2011, but new from 05/09/2003.  Findings are most consistent with synchronous primary bronchogenic carcinomas. 2.  Cholelithiasis.  Original Report Authenticated By: Reyes Ivan, M.D.     ASSESSMENT: This is a very pleasant 66 years old white female recently diagnosed with stage IIB/IIIA non-small cell lung cancer consistent with adenocarcinoma, presenting with right upper lobe mass in addition to suspicious groundglass opacity in the right middle lobe as well as suspicious mediastinal  lymphadenopathy and left upper lobe pulmonary nodule. The patient was seen earlier today by Dr. Michell Heinrich who is considering the patient for for curative radiotherapy to the left upper lobe nodule as well as the right upper lobe pulmonary mass. I had a lengthy discussion with the patient and her daughter today about her disease stage, prognosis and treatment options. I am concerned that the lesion has metastatic disease in the right middle lobe as well as the mediastinum.  PLAN:  #1 I will let the patient finish her curative radiotherapy as planned by Dr. Michell Heinrich. #2 I will complete the staging workup by ordering an MRI of the brain with and without contrast to rule out any brain metastasis. #3 I would consider the patient for systemic chemotherapy with carboplatin for AUC of 5 and Alimta 500 mg/M. given every 2 weeks for 3-4 cycles after completion of her radiotherapy. I discussed with the patient adverse effect of this treatment including but not limited to alopecia, myelosuppression, nausea and vomiting, peripheral neuropathy, liver or renal dysfunction.  The patient would like to proceed with the treatment as planned. #4 I would see the patient back for followup visit in 6 weeks for reevaluation and more detailed discussion of her treatment. I gave the patient and her daughter the time to ask questions and I answered them completely their satisfactions.  The patient knows to call the clinic with any problems, questions or concerns. We can certainly see the patient much sooner if necessary.  Thank you so much for allowing me to participate in the care of [name]. I will continue to follow up the patient with you and assist in her care.  I spent 25 minutes counseling the patient face to face. The total time spent in the appointment was 55 minutes.   Rania Prothero K. 04/15/2011, 3:40 PM

## 2011-04-16 ENCOUNTER — Encounter: Payer: Self-pay | Admitting: *Deleted

## 2011-04-16 ENCOUNTER — Telehealth: Payer: Self-pay | Admitting: *Deleted

## 2011-04-16 ENCOUNTER — Telehealth: Payer: Self-pay | Admitting: Internal Medicine

## 2011-04-16 ENCOUNTER — Other Ambulatory Visit: Payer: Self-pay | Admitting: *Deleted

## 2011-04-16 DIAGNOSIS — C3490 Malignant neoplasm of unspecified part of unspecified bronchus or lung: Secondary | ICD-10-CM

## 2011-04-16 NOTE — Telephone Encounter (Signed)
Talked to the pt then pt's daughter, gave her appt for lab and MRI , a few days later an MD visit

## 2011-04-16 NOTE — Progress Notes (Signed)
Spoke with pt at [facility] 04/15/11.  Gave educational/resource information to pt and family.  Distress screening completed and given to Child psychotherapist.

## 2011-04-16 NOTE — Telephone Encounter (Signed)
called pt,someone had sch her mri for 7p on 1/8 and the pt is unable to do that,r/s mri for 1/11 at 8:45 and advised pt of 2/14 appt    aom

## 2011-04-16 NOTE — Telephone Encounter (Signed)
Left message for pt regarding Rad Onc appt1/8/13

## 2011-04-20 ENCOUNTER — Telehealth: Payer: Self-pay | Admitting: Pulmonary Disease

## 2011-04-20 ENCOUNTER — Ambulatory Visit (INDEPENDENT_AMBULATORY_CARE_PROVIDER_SITE_OTHER): Payer: Medicare Other | Admitting: Pulmonary Disease

## 2011-04-20 ENCOUNTER — Encounter: Payer: Self-pay | Admitting: Radiation Oncology

## 2011-04-20 ENCOUNTER — Other Ambulatory Visit (HOSPITAL_COMMUNITY): Payer: Medicare Other

## 2011-04-20 ENCOUNTER — Ambulatory Visit
Admission: RE | Admit: 2011-04-20 | Discharge: 2011-04-20 | Payer: Medicare Other | Source: Ambulatory Visit | Attending: Radiation Oncology | Admitting: Radiation Oncology

## 2011-04-20 ENCOUNTER — Ambulatory Visit: Admission: RE | Admit: 2011-04-20 | Payer: Medicare Other | Source: Ambulatory Visit

## 2011-04-20 ENCOUNTER — Ambulatory Visit
Admission: RE | Admit: 2011-04-20 | Discharge: 2011-04-20 | Disposition: A | Discharge status: Home / Self Care | Payer: Medicare Other | Source: Ambulatory Visit | Attending: Radiation Oncology | Admitting: Radiation Oncology

## 2011-04-20 DIAGNOSIS — J4489 Other specified chronic obstructive pulmonary disease: Secondary | ICD-10-CM | POA: Diagnosis not present

## 2011-04-20 DIAGNOSIS — I2510 Atherosclerotic heart disease of native coronary artery without angina pectoris: Secondary | ICD-10-CM | POA: Diagnosis not present

## 2011-04-20 DIAGNOSIS — Z510 Encounter for antineoplastic radiation therapy: Secondary | ICD-10-CM | POA: Diagnosis not present

## 2011-04-20 DIAGNOSIS — J449 Chronic obstructive pulmonary disease, unspecified: Secondary | ICD-10-CM

## 2011-04-20 DIAGNOSIS — E119 Type 2 diabetes mellitus without complications: Secondary | ICD-10-CM | POA: Diagnosis not present

## 2011-04-20 DIAGNOSIS — C3490 Malignant neoplasm of unspecified part of unspecified bronchus or lung: Secondary | ICD-10-CM | POA: Diagnosis not present

## 2011-04-20 DIAGNOSIS — C3410 Malignant neoplasm of upper lobe, unspecified bronchus or lung: Secondary | ICD-10-CM | POA: Diagnosis not present

## 2011-04-20 DIAGNOSIS — Z79899 Other long term (current) drug therapy: Secondary | ICD-10-CM | POA: Diagnosis not present

## 2011-04-20 DIAGNOSIS — K219 Gastro-esophageal reflux disease without esophagitis: Secondary | ICD-10-CM | POA: Diagnosis not present

## 2011-04-20 LAB — PULMONARY FUNCTION TEST

## 2011-04-20 NOTE — Progress Notes (Signed)
Please see the Nurse Progress Note in the MD Initial Consult Encounter for this patient. 

## 2011-04-20 NOTE — Progress Notes (Signed)
PATIENT GOT CT/SIM TODAY.  NO C/O

## 2011-04-20 NOTE — Progress Notes (Signed)
PFT done today. 

## 2011-04-20 NOTE — Telephone Encounter (Signed)
I spoke with pt and she states that she is wanting to go back on the advair bc she felt like it helped her. Pt states she has noticed a difference in her breathing since being off of it. She doesn't feel like the combivent/proair helps as much as the advair does. Pt was on symbicort before and did not like it bc it causing her to have thrush. Pt is aware VS is out of the office currently and will return on 04/27/11. Please advise Dr. Craige Cotta, thanks

## 2011-04-20 NOTE — Progress Notes (Signed)
CC:   Coralyn Helling, MD Ines Bloomer, M.D. Lajuana Matte, M.D.  DIAGNOSIS:  Adenocarcinoma of the right upper lobe.  PREVIOUS INTERVENTIONS: 1. Biopsy of right middle lobe mass revealing benign lung parenchyma     on 02/18/2011 as well as biopsy of right upper lobe revealing     benign lung parenchyma.  Bronchial washings negative. 2. Biopsy of the right upper lobe on 03/31/2011 revealing     adenocarcinoma. 3. Biopsy of right middle lobe revealing benign lung tissue.  HISTORY OF PRESENT ILLNESS:  [name] is a pleasant 66 year old female who presents to [facility] today.  She was worked up for complaints of increasing shortness of breath and a cardiac MRI on 01/27/2011 showed a 3.5 cm mass in the right upper lobe concerning for lung cancer.  A CT of the chest on 02/02/2011 showed a right upper lobe mass in the posterior segment measuring 3.3 cm.  Ground-glass density in the right middle lobe was questionable for an infectious source with neoplasm not excluded.  A nodule measuring 8 mm in the left upper lobe was also noted.  Small prevascular and pretracheal lymph nodes were also noted with the largest precarinal lymph node measuring 2.2 x 1.1 cm.  She underwent bronchoscopy and biopsy, which were first negative on November 9.  A followup PET scan performed on 02/12/2011 showed a SUV of 18 in the right upper lobe mass measuring 3.9 x 2.9 cm.  The right middle lobe mass was stable from her chest CT in October and had an SUV of 4.4.  No other hypermetabolic lesions were noted.  No hypermetabolic activity was noted within the mediastinum or associated with any of the lymph nodes in the mediastinum.  The left lung lesion was too small to characterize. Activity was noted in the rectum with an SUV of 7.4, possibly physiologic, but digital rectal examination was recommended.  Ms. Steenbergen then underwent re-biopsy on 03/31/2011 and the right middle lobe again showed no evidence of  malignancy.  The right upper lobe was positive for adenocarcinoma.  She is seen in [facility] today for discussion of further treatment options.  She is accompanied by her daughter and reports fatigue.  She can barely perform her activities of daily living and is not very active in terms of moving about.  She denied any chest pain, hemoptysis or weight loss.  PAST MEDICAL HISTORY:  Type 2 diabetes, anemia, reflux disease, COPD, emphysema, asthma, coronary artery disease, peripheral vascular disease and a history of a non-small cell lung cancer status post left upper lobe resection for lung cancer 20 years ago.  MEDICATIONS:  Albuterol, Combivent, Xanax, aspirin, calcium carbonate, vitamin D, Celexa, Cardizem, Pepcid, gemfibrozil, Vicodin p.r.n., lidocaine patch p.r.n., Cozaar, metformin, potassium chloride, tramadol, and Maxzide 25.  ALLERGIES:  She is allergic to iohexol, codeine and penicillins.  FAMILY HISTORY:  Her father had some sort of bone cancer.  There is no other family history of malignancy.  SOCIAL HISTORY:  She smoked a pack a day for 40 years.  She quit in 2000.  She quit drinking in 1999.  She is currently on disability, but did work as a Interior and spatial designer.  REVIEW OF SYSTEMS:  A 12 point review of systems was performed. Pertinent positives are included as above.  Other positives include loss of appetite.  PHYSICAL EXAMINATION:  She is a pleasant female in no distress sitting comfortably on the exam room table.  Weight 180 pounds,  height 5 feet 6  inches, blood pressure 137/51, pulse 85, temperature 97.8, pulse oximetry 91% on room air.  She is in no distress.  She is alert and oriented x3.  Muscle tone is good.  IMPRESSION:  Stage I adenocarcinoma of the right upper lobe status post left upper lobectomy.  RECOMMENDATIONS:  We discussed Ms. Spadoni's case in conference today. The surgeons do not believe she is a candidate for surgical resection. For this reason, we  will treat her with definitive radiation.  As the right middle lobe lesion has been biopsied multiple times with no positive pathology, I will not treat this lesion.  I discussed with her and her daughter treatment of the right upper lobe lesion with a hypofractionated approach.  I think it is a little big to do SBRT and so will treat her with a CALGB regimen to 70.2 Gy.  I discussed with her and her daughter treatment of the left upper lobe nodule as well.  This does appear to be new over the past several years, but again has not been biopsied, so will just have to see when we simulate her what we are capable of.  She will also be discussing chemotherapy with Dr. Arbutus Ped. 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  I discussed with her and her daughter I think this will be an easy treatment for her as we are far away from her esophagus.  I think that we can treat her with relatively few side effects, although she may get some pretty significant skin reaction of the back given how close it is to her chest wall.  Other than that, I think it should be pretty minimal.  Will plan about 25-27 treatments as an outpatient.  I have scheduled her for simulation on January 8.    ______________________________ Lurline Hare, M.D. SW/MEDQ  D:  04/20/2011  T:  04/20/2011  Job:  [id number]

## 2011-04-20 NOTE — Progress Notes (Signed)
[facility] Radiation Oncology Simulation and Treatment Planning Note   Name: MAKAI AGOSTINELLI MRN: [id number]  Date: 04/20/2011  DOB: January 14, 1946  Status: outpatient    DIAGNOSIS: The encounter diagnosis was T1N0 AdenoCA of RUL.    SIDE: right   CONSENT VERIFIED: yes   SET UP: Patient is setup supine with arms above the head.   IMMOBILIZATION: Following immobilization is used: wing board    NARRATIVE: The patient was brought to the CT Simulation planning suite.  Identity was confirmed.  All relevant records and images related to the planned course of therapy were reviewed.  Then, the patient was positioned in a stable reproducible clinical set-up for radiation therapy.  4D CT was performed to account for tumor motion. CT images were obtained.  Skin markings were placed.  The CT images were loaded into the planning software where the target and avoidance structures were contoured.  The patient's previous PET scan was fused with the planning CT scan.  The GTV was contoured on the MIP scan, planning scan and PET scan. The radiation prescription was entered and confirmed.   TREATMENT PLANNING NOTE:  Treatment planning then occurred. I have requested : 3D Simulation  I have requested a DVH of the following structures: GTV, spinal cord and total lungs.  I have also requested MLCs and an isodose plan.

## 2011-04-21 ENCOUNTER — Telehealth: Payer: Self-pay | Admitting: Internal Medicine

## 2011-04-21 NOTE — Telephone Encounter (Signed)
Moved 2/14 appt to 2/13 due to MM out of office. S/w pt today she is aware.

## 2011-04-22 ENCOUNTER — Telehealth: Payer: Self-pay | Admitting: Radiation Oncology

## 2011-04-22 ENCOUNTER — Telehealth: Payer: Self-pay | Admitting: Internal Medicine

## 2011-04-22 NOTE — Telephone Encounter (Signed)
Pt is aware of lab and MRI for 04/22/12

## 2011-04-22 NOTE — Telephone Encounter (Signed)
Received call from both this patient and her daughter, Kendal Hymen. Answered questions such as start date, length of treatment, and completion date. Spoke with both at length and answered all questions to their satisfaction. Encouraged to call back with any further needs. Both expressed appreciation and understanding.

## 2011-04-23 ENCOUNTER — Ambulatory Visit (HOSPITAL_COMMUNITY)
Admission: RE | Admit: 2011-04-23 | Discharge: 2011-04-23 | Disposition: A | Discharge status: Home / Self Care | Payer: Medicare Other | Source: Ambulatory Visit | Attending: Internal Medicine | Admitting: Internal Medicine

## 2011-04-23 ENCOUNTER — Other Ambulatory Visit: Payer: Medicare Other | Admitting: Lab

## 2011-04-23 ENCOUNTER — Other Ambulatory Visit (HOSPITAL_BASED_OUTPATIENT_CLINIC_OR_DEPARTMENT_OTHER): Payer: Medicare Other

## 2011-04-23 DIAGNOSIS — C3490 Malignant neoplasm of unspecified part of unspecified bronchus or lung: Secondary | ICD-10-CM | POA: Diagnosis not present

## 2011-04-23 DIAGNOSIS — F29 Unspecified psychosis not due to a substance or known physiological condition: Secondary | ICD-10-CM | POA: Insufficient documentation

## 2011-04-23 DIAGNOSIS — K219 Gastro-esophageal reflux disease without esophagitis: Secondary | ICD-10-CM | POA: Diagnosis not present

## 2011-04-23 DIAGNOSIS — F329 Major depressive disorder, single episode, unspecified: Secondary | ICD-10-CM | POA: Diagnosis not present

## 2011-04-23 DIAGNOSIS — F3289 Other specified depressive episodes: Secondary | ICD-10-CM | POA: Insufficient documentation

## 2011-04-23 DIAGNOSIS — Z510 Encounter for antineoplastic radiation therapy: Secondary | ICD-10-CM | POA: Diagnosis not present

## 2011-04-23 DIAGNOSIS — Z85118 Personal history of other malignant neoplasm of bronchus and lung: Secondary | ICD-10-CM | POA: Diagnosis not present

## 2011-04-23 DIAGNOSIS — J449 Chronic obstructive pulmonary disease, unspecified: Secondary | ICD-10-CM | POA: Diagnosis not present

## 2011-04-23 DIAGNOSIS — I2510 Atherosclerotic heart disease of native coronary artery without angina pectoris: Secondary | ICD-10-CM | POA: Diagnosis not present

## 2011-04-23 DIAGNOSIS — C3410 Malignant neoplasm of upper lobe, unspecified bronchus or lung: Secondary | ICD-10-CM | POA: Diagnosis not present

## 2011-04-23 DIAGNOSIS — E119 Type 2 diabetes mellitus without complications: Secondary | ICD-10-CM | POA: Diagnosis not present

## 2011-04-23 LAB — COMPREHENSIVE METABOLIC PANEL
AST: 20 U/L (ref 0–37)
Albumin: 3.7 g/dL (ref 3.5–5.2)
Alkaline Phosphatase: 99 U/L (ref 39–117)
Glucose, Bld: 97 mg/dL (ref 70–99)
Potassium: 3.8 mEq/L (ref 3.5–5.3)
Sodium: 137 mEq/L (ref 135–145)
Total Protein: 6.7 g/dL (ref 6.0–8.3)

## 2011-04-23 LAB — CBC WITH DIFFERENTIAL/PLATELET
Eosinophils Absolute: 0.4 10*3/uL (ref 0.0–0.5)
MCV: 79.3 fL — ABNORMAL LOW (ref 79.5–101.0)
MONO%: 7.8 % (ref 0.0–14.0)
NEUT#: 4.7 10*3/uL (ref 1.5–6.5)
RBC: 3.6 10*6/uL — ABNORMAL LOW (ref 3.70–5.45)
RDW: 18.1 % — ABNORMAL HIGH (ref 11.2–14.5)
WBC: 8.8 10*3/uL (ref 3.9–10.3)

## 2011-04-23 MED ORDER — GADOBENATE DIMEGLUMINE 529 MG/ML IV SOLN
17.0000 mL | Freq: Once | INTRAVENOUS | Status: AC | PRN
  Administered 2011-04-23: 17 mL via INTRAVENOUS

## 2011-04-26 DIAGNOSIS — E119 Type 2 diabetes mellitus without complications: Secondary | ICD-10-CM | POA: Diagnosis not present

## 2011-04-26 DIAGNOSIS — I2510 Atherosclerotic heart disease of native coronary artery without angina pectoris: Secondary | ICD-10-CM | POA: Diagnosis not present

## 2011-04-26 DIAGNOSIS — Z510 Encounter for antineoplastic radiation therapy: Secondary | ICD-10-CM | POA: Diagnosis not present

## 2011-04-26 DIAGNOSIS — C3490 Malignant neoplasm of unspecified part of unspecified bronchus or lung: Secondary | ICD-10-CM | POA: Diagnosis not present

## 2011-04-26 DIAGNOSIS — J449 Chronic obstructive pulmonary disease, unspecified: Secondary | ICD-10-CM | POA: Diagnosis not present

## 2011-04-26 DIAGNOSIS — K219 Gastro-esophageal reflux disease without esophagitis: Secondary | ICD-10-CM | POA: Diagnosis not present

## 2011-04-27 ENCOUNTER — Ambulatory Visit (INDEPENDENT_AMBULATORY_CARE_PROVIDER_SITE_OTHER): Payer: Medicare Other | Admitting: Pulmonary Disease

## 2011-04-27 ENCOUNTER — Encounter: Payer: Self-pay | Admitting: Radiation Oncology

## 2011-04-27 ENCOUNTER — Ambulatory Visit
Admission: RE | Admit: 2011-04-27 | Discharge: 2011-04-27 | Disposition: A | Discharge status: Home / Self Care | Payer: Medicare Other | Source: Ambulatory Visit | Attending: Radiation Oncology | Admitting: Radiation Oncology

## 2011-04-27 ENCOUNTER — Encounter: Payer: Self-pay | Admitting: Pulmonary Disease

## 2011-04-27 DIAGNOSIS — J9600 Acute respiratory failure, unspecified whether with hypoxia or hypercapnia: Secondary | ICD-10-CM

## 2011-04-27 DIAGNOSIS — C3410 Malignant neoplasm of upper lobe, unspecified bronchus or lung: Secondary | ICD-10-CM | POA: Diagnosis not present

## 2011-04-27 DIAGNOSIS — J9691 Respiratory failure, unspecified with hypoxia: Secondary | ICD-10-CM

## 2011-04-27 DIAGNOSIS — J4489 Other specified chronic obstructive pulmonary disease: Secondary | ICD-10-CM | POA: Diagnosis not present

## 2011-04-27 DIAGNOSIS — Z23 Encounter for immunization: Secondary | ICD-10-CM

## 2011-04-27 DIAGNOSIS — K219 Gastro-esophageal reflux disease without esophagitis: Secondary | ICD-10-CM | POA: Diagnosis not present

## 2011-04-27 DIAGNOSIS — C3490 Malignant neoplasm of unspecified part of unspecified bronchus or lung: Secondary | ICD-10-CM

## 2011-04-27 DIAGNOSIS — I2510 Atherosclerotic heart disease of native coronary artery without angina pectoris: Secondary | ICD-10-CM | POA: Diagnosis not present

## 2011-04-27 DIAGNOSIS — E119 Type 2 diabetes mellitus without complications: Secondary | ICD-10-CM | POA: Diagnosis not present

## 2011-04-27 DIAGNOSIS — Z0000 Encounter for general adult medical examination without abnormal findings: Secondary | ICD-10-CM | POA: Diagnosis not present

## 2011-04-27 DIAGNOSIS — J449 Chronic obstructive pulmonary disease, unspecified: Secondary | ICD-10-CM | POA: Diagnosis not present

## 2011-04-27 DIAGNOSIS — Z510 Encounter for antineoplastic radiation therapy: Secondary | ICD-10-CM | POA: Diagnosis not present

## 2011-04-27 NOTE — Assessment & Plan Note (Signed)
She is stable on her current inhaler regimen of advair and prn combivent and proair.  Explained that she needs to pace her activities, but that she should try to maintain her activities as best as possible.    Also discussed referral to pulmonary rehab, but she declined at this time.

## 2011-04-27 NOTE — Progress Notes (Signed)
Chief Complaint  Patient presents with  . Follow-up    Pt states her breathing hasn't been doing well. Pt c/o cough w/ occas yellow phlem--last night had stringy blood in it, wheezing, chest tightness, pain in middle of back last night. pt is not able to sleep at night    History of Present Illness: [name] is a 66 y.o. female former smoker with GOLD 2 COPD and prior hx of NSCLC and new dx of NSCLC December 2012.  She is scheduled to start XRT later this week.  She still has episodes of cough, wheeze, and chest congestion.  She uses albuterol or combivent and these help.  She is also using advair.  She is not very active.  She gets winded very easily with minimal exertion.  She has been using her oxygen, and feels this helps.  She needs a new primary care doctor.  Past Medical History  Diagnosis Date  . Polyneuropathy in diabetes   . Restless legs syndrome (RLS)   . Chronic sinusitis   . Hypertension   . Hyperlipidemia   . Type II or unspecified type diabetes mellitus without mention of complication, not stated as uncontrolled   . Anemia, unspecified   . GERD (gastroesophageal reflux disease)   . Respiratory failure with hypoxia 01/02/2010  . Complication of anesthesia     slow to wake up  . Adenocarcinoma, lung     upper left lobe  . COPD (chronic obstructive pulmonary disease)   . Emphysema   . Asthma   . Coronary artery disease     Dr. Excell Seltzer had stress test done  . Dysrhythmia     unsure what type  . Peripheral vascular disease   . Non-small cell lung cancer 03/31/2011    Adenocarcinoma Rt upper lobe mass    Past Surgical History  Procedure Date  . Lung lobectomy     left  . Septoplasty   . Total hip arthroplasty 01/2010    left  . Bronchoscopy 02/2011    Current Outpatient Prescriptions on File Prior to Visit  Medication Sig Dispense Refill  . albuterol (PROAIR HFA) 108 (90 BASE) MCG/ACT inhaler Inhale 2 puffs into the lungs every 6 (six) hours as  needed. For shortness of breath      . albuterol-ipratropium (COMBIVENT) 18-103 MCG/ACT inhaler Inhale 2 puffs into the lungs every 6 (six) hours as needed. For shortness of breath      . ALPRAZolam (XANAX) 1 MG tablet Take 1 mg by mouth 3 (three) times daily as needed. For anxiety      . aspirin EC 325 MG tablet Take 325 mg by mouth daily.        . calcium carbonate 200 MG capsule Take 1,000 mg by mouth daily. Does not take everyday       . cholecalciferol (VITAMIN D) 1000 UNITS tablet Take 1,000 Units by mouth daily. Dose varies and pt does not take every day       . citalopram (CELEXA) 40 MG tablet Take 1 tablet by mouth at bedtime.      Marland Kitchen diltiazem (CARDIZEM CD) 240 MG 24 hr capsule Take 1 capsule (240 mg total) by mouth daily.  30 capsule  3  . gemfibrozil (LOPID) 600 MG tablet Take 600 mg by mouth 2 (two) times daily before a meal.        . HYDROcodone-acetaminophen (VICODIN ES) 7.5-750 MG per tablet Take 1 tablet by mouth every 6 (six) hours as needed.  For severe pain      . losartan (COZAAR) 50 MG tablet Take 50 mg by mouth 2 (two) times daily.        . metFORMIN (GLUCOPHAGE) 500 MG tablet Take 500 mg by mouth 2 (two) times daily with a meal.       . omeprazole (PRILOSEC) 20 MG capsule Take 20 mg by mouth 2 (two) times daily.        . potassium chloride SA (K-DUR,KLOR-CON) 20 MEQ tablet Take 1 tablet (20 mEq total) by mouth 2 (two) times daily.  60 tablet  6  . traMADol (ULTRAM) 50 MG tablet Take 50 mg by mouth every 4 (four) hours as needed. For moderate pain      . triamterene-hydrochlorothiazide (MAXZIDE-25) 37.5-25 MG per tablet Take 1 tablet by mouth daily.        Allergies  Allergen Reactions  . Iohexol Shortness Of Breath and Other (See Comments)    Burning of skin  . Codeine     REACTION: GI upset  . Penicillins     REACTION: itching    Physical Exam:  There were no vitals taken for this visit. There is no height or weight on file to calculate BMI.  General - obese    HEENT - clear nasal discharge, no sinus tenderness, no oral exudate, no LAN, TM clear  Cardiac - s1s2 regular, no murmur  Chest - prolonged exhalation, decreased breath sounds, no wheeze/rales/dullness  Abd - soft, nontender  Ext - no edema  Neuro - normal strength, CN intact  Psych - normal mood/behavior  PFT 04/19/10>>FEV1 1.80(80%), FEV1% 60, TLC 5.10(97%), DLCO 42%, +BD  Assessment/Plan:  Outpatient Encounter Prescriptions as of 04/27/2011  Medication Sig Dispense Refill  . albuterol (PROAIR HFA) 108 (90 BASE) MCG/ACT inhaler Inhale 2 puffs into the lungs every 6 (six) hours as needed. For shortness of breath      . albuterol-ipratropium (COMBIVENT) 18-103 MCG/ACT inhaler Inhale 2 puffs into the lungs every 6 (six) hours as needed. For shortness of breath      . ALPRAZolam (XANAX) 1 MG tablet Take 1 mg by mouth 3 (three) times daily as needed. For anxiety      . aspirin EC 325 MG tablet Take 325 mg by mouth daily.        . calcium carbonate 200 MG capsule Take 1,000 mg by mouth daily. Does not take everyday       . cholecalciferol (VITAMIN D) 1000 UNITS tablet Take 1,000 Units by mouth daily. Dose varies and pt does not take every day       . citalopram (CELEXA) 40 MG tablet Take 1 tablet by mouth at bedtime.      Marland Kitchen diltiazem (CARDIZEM CD) 240 MG 24 hr capsule Take 1 capsule (240 mg total) by mouth daily.  30 capsule  3  . Fluticasone-Salmeterol (ADVAIR) 250-50 MCG/DOSE AEPB Inhale 1 puff into the lungs every 12 (twelve) hours.      Marland Kitchen gemfibrozil (LOPID) 600 MG tablet Take 600 mg by mouth 2 (two) times daily before a meal.        . HYDROcodone-acetaminophen (VICODIN ES) 7.5-750 MG per tablet Take 1 tablet by mouth every 6 (six) hours as needed. For severe pain      . losartan (COZAAR) 50 MG tablet Take 50 mg by mouth 2 (two) times daily.        . metFORMIN (GLUCOPHAGE) 500 MG tablet Take 500 mg by mouth 2 (two) times daily  with a meal.       . omeprazole (PRILOSEC) 20 MG capsule Take  20 mg by mouth 2 (two) times daily.        . potassium chloride SA (K-DUR,KLOR-CON) 20 MEQ tablet Take 1 tablet (20 mEq total) by mouth 2 (two) times daily.  60 tablet  6  . traMADol (ULTRAM) 50 MG tablet Take 50 mg by mouth every 4 (four) hours as needed. For moderate pain      . triamterene-hydrochlorothiazide (MAXZIDE-25) 37.5-25 MG per tablet Take 1 tablet by mouth daily.      Marland Kitchen DISCONTD: famotidine (PEPCID) 20 MG tablet Take 20 mg by mouth 2 (two) times daily.        Marland Kitchen DISCONTD: lidocaine (LIDODERM) 5 % Place 1 patch onto the skin daily as needed. For Pain. Remove & Discard patch within 12 hours or as directed by MD         Ramil Edgington Pager:  [phone] 04/27/2011, 12:02 PM

## 2011-04-27 NOTE — Assessment & Plan Note (Signed)
She is not a surgical candidate based on her lung function (diffusion capacity).  She is to start radiation therapy later this week, and f/u with oncology also.

## 2011-04-27 NOTE — Progress Notes (Signed)
[facility] Radiation Oncology Simulation Verification Note   Name: [name] MRN: [id number]  Date: 04/27/2011  DOB: [birth year]  Status: outpatient    DIAGNOSIS:Lung cancer    POSITION: Patient is positioned supine and  Isocenter was reviewed. MLCs were also removed.  I verified the cone beam CT registration at the treatment machine.   Treatment was approved.  NARRATIVE:

## 2011-04-27 NOTE — Assessment & Plan Note (Signed)
She is to continue with 2 liters supplemental oxygen.

## 2011-04-27 NOTE — Patient Instructions (Signed)
Will arrange for primary care referral Follow up in 4 months

## 2011-04-28 ENCOUNTER — Ambulatory Visit
Admission: RE | Admit: 2011-04-28 | Discharge: 2011-04-28 | Disposition: A | Discharge status: Home / Self Care | Payer: Medicare Other | Source: Ambulatory Visit | Attending: Radiation Oncology | Admitting: Radiation Oncology

## 2011-04-28 DIAGNOSIS — J449 Chronic obstructive pulmonary disease, unspecified: Secondary | ICD-10-CM | POA: Diagnosis not present

## 2011-04-28 DIAGNOSIS — K219 Gastro-esophageal reflux disease without esophagitis: Secondary | ICD-10-CM | POA: Diagnosis not present

## 2011-04-28 DIAGNOSIS — Z510 Encounter for antineoplastic radiation therapy: Secondary | ICD-10-CM | POA: Diagnosis not present

## 2011-04-28 DIAGNOSIS — C3410 Malignant neoplasm of upper lobe, unspecified bronchus or lung: Secondary | ICD-10-CM | POA: Diagnosis not present

## 2011-04-28 DIAGNOSIS — E119 Type 2 diabetes mellitus without complications: Secondary | ICD-10-CM | POA: Diagnosis not present

## 2011-04-28 DIAGNOSIS — I2510 Atherosclerotic heart disease of native coronary artery without angina pectoris: Secondary | ICD-10-CM | POA: Diagnosis not present

## 2011-04-28 DIAGNOSIS — C3490 Malignant neoplasm of unspecified part of unspecified bronchus or lung: Secondary | ICD-10-CM | POA: Diagnosis not present

## 2011-04-29 ENCOUNTER — Ambulatory Visit
Admission: RE | Admit: 2011-04-29 | Discharge: 2011-04-29 | Disposition: A | Discharge status: Home / Self Care | Payer: Medicare Other | Source: Ambulatory Visit | Attending: Radiation Oncology | Admitting: Radiation Oncology

## 2011-04-29 DIAGNOSIS — C3490 Malignant neoplasm of unspecified part of unspecified bronchus or lung: Secondary | ICD-10-CM | POA: Diagnosis not present

## 2011-04-29 DIAGNOSIS — E119 Type 2 diabetes mellitus without complications: Secondary | ICD-10-CM | POA: Diagnosis not present

## 2011-04-29 DIAGNOSIS — J449 Chronic obstructive pulmonary disease, unspecified: Secondary | ICD-10-CM | POA: Diagnosis not present

## 2011-04-29 DIAGNOSIS — Z510 Encounter for antineoplastic radiation therapy: Secondary | ICD-10-CM | POA: Diagnosis not present

## 2011-04-29 DIAGNOSIS — I2510 Atherosclerotic heart disease of native coronary artery without angina pectoris: Secondary | ICD-10-CM | POA: Diagnosis not present

## 2011-04-29 DIAGNOSIS — K219 Gastro-esophageal reflux disease without esophagitis: Secondary | ICD-10-CM | POA: Diagnosis not present

## 2011-04-29 DIAGNOSIS — C3410 Malignant neoplasm of upper lobe, unspecified bronchus or lung: Secondary | ICD-10-CM | POA: Diagnosis not present

## 2011-04-30 ENCOUNTER — Ambulatory Visit: Payer: Medicare Other

## 2011-05-03 ENCOUNTER — Ambulatory Visit
Admission: RE | Admit: 2011-05-03 | Discharge: 2011-05-03 | Disposition: A | Discharge status: Home / Self Care | Payer: Medicare Other | Source: Ambulatory Visit | Attending: Radiation Oncology | Admitting: Radiation Oncology

## 2011-05-03 DIAGNOSIS — J449 Chronic obstructive pulmonary disease, unspecified: Secondary | ICD-10-CM | POA: Diagnosis not present

## 2011-05-03 DIAGNOSIS — C3490 Malignant neoplasm of unspecified part of unspecified bronchus or lung: Secondary | ICD-10-CM | POA: Diagnosis not present

## 2011-05-03 DIAGNOSIS — Z510 Encounter for antineoplastic radiation therapy: Secondary | ICD-10-CM | POA: Diagnosis not present

## 2011-05-03 DIAGNOSIS — C3410 Malignant neoplasm of upper lobe, unspecified bronchus or lung: Secondary | ICD-10-CM | POA: Diagnosis not present

## 2011-05-03 DIAGNOSIS — K219 Gastro-esophageal reflux disease without esophagitis: Secondary | ICD-10-CM | POA: Diagnosis not present

## 2011-05-03 DIAGNOSIS — I2510 Atherosclerotic heart disease of native coronary artery without angina pectoris: Secondary | ICD-10-CM | POA: Diagnosis not present

## 2011-05-03 DIAGNOSIS — E119 Type 2 diabetes mellitus without complications: Secondary | ICD-10-CM | POA: Diagnosis not present

## 2011-05-04 ENCOUNTER — Ambulatory Visit
Admission: RE | Admit: 2011-05-04 | Discharge: 2011-05-04 | Disposition: A | Discharge status: Home / Self Care | Payer: Medicare Other | Source: Ambulatory Visit | Attending: Radiation Oncology | Admitting: Radiation Oncology

## 2011-05-04 ENCOUNTER — Encounter: Payer: Medicare Other | Admitting: Radiation Oncology

## 2011-05-04 DIAGNOSIS — J449 Chronic obstructive pulmonary disease, unspecified: Secondary | ICD-10-CM | POA: Diagnosis not present

## 2011-05-04 DIAGNOSIS — K219 Gastro-esophageal reflux disease without esophagitis: Secondary | ICD-10-CM | POA: Diagnosis not present

## 2011-05-04 DIAGNOSIS — C3490 Malignant neoplasm of unspecified part of unspecified bronchus or lung: Secondary | ICD-10-CM | POA: Diagnosis not present

## 2011-05-04 DIAGNOSIS — I2510 Atherosclerotic heart disease of native coronary artery without angina pectoris: Secondary | ICD-10-CM | POA: Diagnosis not present

## 2011-05-04 DIAGNOSIS — E119 Type 2 diabetes mellitus without complications: Secondary | ICD-10-CM | POA: Diagnosis not present

## 2011-05-04 DIAGNOSIS — Z510 Encounter for antineoplastic radiation therapy: Secondary | ICD-10-CM | POA: Diagnosis not present

## 2011-05-04 DIAGNOSIS — C3410 Malignant neoplasm of upper lobe, unspecified bronchus or lung: Secondary | ICD-10-CM | POA: Diagnosis not present

## 2011-05-04 NOTE — Progress Notes (Signed)
HAS SOB EVEN SITTING, HARD FOR HER TO DRESS SELF, SW REFERRAL ALREADY MADE FOR TRANSPORTATION ISSUES.  COUGHING AT NIGHT KEEPING HER AWAKE, ALSO COUGH IN AM.

## 2011-05-04 NOTE — Progress Notes (Signed)
Weekly Management Note Current Dose: 10.8  Gy  Projected Dose:  70.2 Gy   Narrative:  The patient presents for routine under treatment assessment.  CBCT/MVCT images/Port film x-rays were reviewed.  The chart was checked. Shortness of breath continues. Cough bothersome at night. Responds to OTC but she is scared to take.   Physical Findings: Weight: 190 lb 8 oz (86.41 kg). Unchanged. [place] in place.  Impression:  The patient is tolerating radiation.  Plan:  Continue treatment as planned. Add biafene to skin. RN education performed.

## 2011-05-05 ENCOUNTER — Encounter: Payer: Self-pay | Admitting: Pulmonary Disease

## 2011-05-05 ENCOUNTER — Ambulatory Visit
Admission: RE | Admit: 2011-05-05 | Discharge: 2011-05-05 | Disposition: A | Discharge status: Home / Self Care | Payer: Medicare Other | Source: Ambulatory Visit | Attending: Radiation Oncology | Admitting: Radiation Oncology

## 2011-05-05 DIAGNOSIS — K219 Gastro-esophageal reflux disease without esophagitis: Secondary | ICD-10-CM | POA: Diagnosis not present

## 2011-05-05 DIAGNOSIS — C3410 Malignant neoplasm of upper lobe, unspecified bronchus or lung: Secondary | ICD-10-CM | POA: Diagnosis not present

## 2011-05-05 DIAGNOSIS — Z510 Encounter for antineoplastic radiation therapy: Secondary | ICD-10-CM | POA: Diagnosis not present

## 2011-05-05 DIAGNOSIS — C3490 Malignant neoplasm of unspecified part of unspecified bronchus or lung: Secondary | ICD-10-CM | POA: Diagnosis not present

## 2011-05-05 DIAGNOSIS — E119 Type 2 diabetes mellitus without complications: Secondary | ICD-10-CM | POA: Diagnosis not present

## 2011-05-05 DIAGNOSIS — I2510 Atherosclerotic heart disease of native coronary artery without angina pectoris: Secondary | ICD-10-CM | POA: Diagnosis not present

## 2011-05-05 DIAGNOSIS — J449 Chronic obstructive pulmonary disease, unspecified: Secondary | ICD-10-CM | POA: Diagnosis not present

## 2011-05-06 ENCOUNTER — Ambulatory Visit
Admission: RE | Admit: 2011-05-06 | Discharge: 2011-05-06 | Disposition: A | Discharge status: Home / Self Care | Payer: Medicare Other | Source: Ambulatory Visit | Attending: Radiation Oncology | Admitting: Radiation Oncology

## 2011-05-06 ENCOUNTER — Ambulatory Visit: Payer: Medicare Other | Admitting: Radiation Oncology

## 2011-05-06 ENCOUNTER — Telehealth: Payer: Self-pay | Admitting: Internal Medicine

## 2011-05-06 DIAGNOSIS — J449 Chronic obstructive pulmonary disease, unspecified: Secondary | ICD-10-CM | POA: Diagnosis not present

## 2011-05-06 DIAGNOSIS — C3410 Malignant neoplasm of upper lobe, unspecified bronchus or lung: Secondary | ICD-10-CM | POA: Diagnosis not present

## 2011-05-06 DIAGNOSIS — C3490 Malignant neoplasm of unspecified part of unspecified bronchus or lung: Secondary | ICD-10-CM

## 2011-05-06 DIAGNOSIS — Z510 Encounter for antineoplastic radiation therapy: Secondary | ICD-10-CM | POA: Diagnosis not present

## 2011-05-06 DIAGNOSIS — E119 Type 2 diabetes mellitus without complications: Secondary | ICD-10-CM | POA: Diagnosis not present

## 2011-05-06 DIAGNOSIS — K219 Gastro-esophageal reflux disease without esophagitis: Secondary | ICD-10-CM | POA: Diagnosis not present

## 2011-05-06 DIAGNOSIS — I2510 Atherosclerotic heart disease of native coronary artery without angina pectoris: Secondary | ICD-10-CM | POA: Diagnosis not present

## 2011-05-06 MED ORDER — BIAFINE EX EMUL: CUTANEOUS | Status: DC | PRN

## 2011-05-06 NOTE — Progress Notes (Signed)
TEACHING DONE WITH PATIENT, VERBALIZES UNDERSTANDING.  BOOKLET, RADIATION AND YOU GIVEN TO PT WITH PERTINENT INFO MARKED.  BIAFINE GIVEN WITH INSTRUCTIONS FOR USE PER DR. Michell Heinrich.

## 2011-05-06 NOTE — Telephone Encounter (Signed)
Pt  Reports " bad head cold" with  nasal congestion/drainage. Headache, Low grade fever , nonproductive cough . She feels like her symptoms are worse today than yesterday. I told her to report these to her therapist today and I will notify XRT nurse. I called Anita Sink in radiation and gave her update on pt.

## 2011-05-07 ENCOUNTER — Ambulatory Visit: Payer: Medicare Other

## 2011-05-07 DIAGNOSIS — E119 Type 2 diabetes mellitus without complications: Secondary | ICD-10-CM | POA: Diagnosis not present

## 2011-05-07 DIAGNOSIS — J449 Chronic obstructive pulmonary disease, unspecified: Secondary | ICD-10-CM | POA: Diagnosis not present

## 2011-05-07 DIAGNOSIS — K219 Gastro-esophageal reflux disease without esophagitis: Secondary | ICD-10-CM | POA: Diagnosis not present

## 2011-05-07 DIAGNOSIS — C3490 Malignant neoplasm of unspecified part of unspecified bronchus or lung: Secondary | ICD-10-CM | POA: Diagnosis not present

## 2011-05-07 DIAGNOSIS — Z510 Encounter for antineoplastic radiation therapy: Secondary | ICD-10-CM | POA: Diagnosis not present

## 2011-05-07 DIAGNOSIS — I2510 Atherosclerotic heart disease of native coronary artery without angina pectoris: Secondary | ICD-10-CM | POA: Diagnosis not present

## 2011-05-07 MED ORDER — BIAFINE EX EMUL
CUTANEOUS | Status: AC | PRN
  Administered 2011-05-06: 1 via TOPICAL
  Administered 2011-05-07: 15:00:00 via TOPICAL

## 2011-05-10 ENCOUNTER — Ambulatory Visit
Admission: RE | Admit: 2011-05-10 | Discharge: 2011-05-10 | Disposition: A | Discharge status: Home / Self Care | Payer: Medicare Other | Source: Ambulatory Visit | Attending: Radiation Oncology | Admitting: Radiation Oncology

## 2011-05-10 ENCOUNTER — Encounter: Payer: Self-pay | Admitting: *Deleted

## 2011-05-10 DIAGNOSIS — J449 Chronic obstructive pulmonary disease, unspecified: Secondary | ICD-10-CM | POA: Diagnosis not present

## 2011-05-10 DIAGNOSIS — C3410 Malignant neoplasm of upper lobe, unspecified bronchus or lung: Secondary | ICD-10-CM | POA: Diagnosis not present

## 2011-05-10 DIAGNOSIS — C3490 Malignant neoplasm of unspecified part of unspecified bronchus or lung: Secondary | ICD-10-CM | POA: Diagnosis not present

## 2011-05-10 DIAGNOSIS — I2510 Atherosclerotic heart disease of native coronary artery without angina pectoris: Secondary | ICD-10-CM | POA: Diagnosis not present

## 2011-05-10 DIAGNOSIS — K219 Gastro-esophageal reflux disease without esophagitis: Secondary | ICD-10-CM | POA: Diagnosis not present

## 2011-05-10 DIAGNOSIS — E119 Type 2 diabetes mellitus without complications: Secondary | ICD-10-CM | POA: Diagnosis not present

## 2011-05-10 DIAGNOSIS — Z510 Encounter for antineoplastic radiation therapy: Secondary | ICD-10-CM | POA: Diagnosis not present

## 2011-05-10 NOTE — Progress Notes (Signed)
[facility] Brief Psychosocial Assessment Clinical Social Work  Visual merchandiser met with patient in radiation lobby to offer support and assess for needs.    Patient identified barriers to care as transportation concerns.  CSW made referral to [facility] and instructed patient to contact [employer] Pt is also eligible for Medicaid transportation through [facility], but patient has declined that service at this time.  CSW also provided wig voucher and information on [facility] wig shop.  Pt has no other needs at this time. CSW encouraged pt to call if additional needs arise.  Kathrin Penner, MSW, [facility] Clinical Social Worker [facility] [phone]

## 2011-05-10 NOTE — Progress Notes (Signed)
Clinical Social Worker received call regarding transportation assistance and requesting information on wigs and other resources. Patient and CSW spoke briefly and plan to meet 05/11/11 after radiation appointment. Kathrin Penner, MSW, [facility] Clinical Social Worker [facility] [phone]

## 2011-05-11 ENCOUNTER — Other Ambulatory Visit: Payer: Self-pay | Admitting: Radiation Oncology

## 2011-05-11 ENCOUNTER — Ambulatory Visit
Admission: RE | Admit: 2011-05-11 | Discharge: 2011-05-11 | Disposition: A | Discharge status: Home / Self Care | Payer: Medicare Other | Source: Ambulatory Visit | Attending: Radiation Oncology | Admitting: Radiation Oncology

## 2011-05-11 ENCOUNTER — Encounter: Payer: Self-pay | Admitting: Radiation Oncology

## 2011-05-11 DIAGNOSIS — K219 Gastro-esophageal reflux disease without esophagitis: Secondary | ICD-10-CM | POA: Diagnosis not present

## 2011-05-11 DIAGNOSIS — C3410 Malignant neoplasm of upper lobe, unspecified bronchus or lung: Secondary | ICD-10-CM

## 2011-05-11 DIAGNOSIS — Z510 Encounter for antineoplastic radiation therapy: Secondary | ICD-10-CM | POA: Diagnosis not present

## 2011-05-11 DIAGNOSIS — C3490 Malignant neoplasm of unspecified part of unspecified bronchus or lung: Secondary | ICD-10-CM | POA: Diagnosis not present

## 2011-05-11 DIAGNOSIS — J449 Chronic obstructive pulmonary disease, unspecified: Secondary | ICD-10-CM | POA: Diagnosis not present

## 2011-05-11 DIAGNOSIS — E119 Type 2 diabetes mellitus without complications: Secondary | ICD-10-CM | POA: Diagnosis not present

## 2011-05-11 DIAGNOSIS — I2510 Atherosclerotic heart disease of native coronary artery without angina pectoris: Secondary | ICD-10-CM | POA: Diagnosis not present

## 2011-05-11 NOTE — Progress Notes (Signed)
Weekly Management Note Current Dose:   Gy  Projected Dose:  Gy   Narrative:  The patient presents for routine under treatment assessment.  CBCT/MVCT images/Port film x-rays were reviewed.  The chart was checked. Tired. In bed all weekend. Walked down here alone but needs wheelchair to get back up. Sinus drainage and cough. No fevers. Poor appetite. Does not really want to be on prednisone again due to reflux disease.   Physical Findings: No distress. Pulse ox 94% on 2L [place].  Wt Readings from Last 3 Encounters:  05/11/11 182 lb 11.2 oz (82.872 kg)  05/04/11 190 lb 8 oz (86.41 kg)  04/27/11 189 lb 3.2 oz (85.821 kg)    Impression:  The patient is tolerating radiation.  Plan:  Continue treatment as planned. Will ask dietician to see. Discussed using muciness and claritin for sinus disease. If does not improve or symptoms worsen, will call Dr. Craige Cotta to discuss prednisone/abx possibly.

## 2011-05-11 NOTE — Progress Notes (Signed)
Pt ambulated slow to nursing, WEARING OXYGEN AT 2 LITERS, N/C, SATS=94%, TOOK ORTHO VITALS, LOSS 5.1 LB 1 WEEK, , COUGHS UP IN MORNINGS CLEAR/WHITE PHEGLM, FOOD GETTING STUCK STERNUM, AND SOME FOOD S TASTE LIKE METAL STATED PT, GOING TO GET WEEK TODAY,, NO PIN TODAY 2:56 PM

## 2011-05-12 ENCOUNTER — Encounter: Payer: Medicare Other | Admitting: Nutrition

## 2011-05-12 ENCOUNTER — Ambulatory Visit
Admission: RE | Admit: 2011-05-12 | Discharge: 2011-05-12 | Disposition: A | Discharge status: Home / Self Care | Payer: Medicare Other | Source: Ambulatory Visit | Attending: Radiation Oncology | Admitting: Radiation Oncology

## 2011-05-12 DIAGNOSIS — E119 Type 2 diabetes mellitus without complications: Secondary | ICD-10-CM | POA: Diagnosis not present

## 2011-05-12 DIAGNOSIS — C3410 Malignant neoplasm of upper lobe, unspecified bronchus or lung: Secondary | ICD-10-CM | POA: Diagnosis not present

## 2011-05-12 DIAGNOSIS — Z510 Encounter for antineoplastic radiation therapy: Secondary | ICD-10-CM | POA: Diagnosis not present

## 2011-05-12 DIAGNOSIS — J449 Chronic obstructive pulmonary disease, unspecified: Secondary | ICD-10-CM | POA: Diagnosis not present

## 2011-05-12 DIAGNOSIS — I2510 Atherosclerotic heart disease of native coronary artery without angina pectoris: Secondary | ICD-10-CM | POA: Diagnosis not present

## 2011-05-12 DIAGNOSIS — K219 Gastro-esophageal reflux disease without esophagitis: Secondary | ICD-10-CM | POA: Diagnosis not present

## 2011-05-12 DIAGNOSIS — C3490 Malignant neoplasm of unspecified part of unspecified bronchus or lung: Secondary | ICD-10-CM | POA: Diagnosis not present

## 2011-05-13 ENCOUNTER — Ambulatory Visit
Admission: RE | Admit: 2011-05-13 | Discharge: 2011-05-13 | Disposition: A | Discharge status: Home / Self Care | Payer: Medicare Other | Source: Ambulatory Visit | Attending: Radiation Oncology | Admitting: Radiation Oncology

## 2011-05-13 DIAGNOSIS — Z510 Encounter for antineoplastic radiation therapy: Secondary | ICD-10-CM | POA: Diagnosis not present

## 2011-05-13 DIAGNOSIS — K219 Gastro-esophageal reflux disease without esophagitis: Secondary | ICD-10-CM | POA: Diagnosis not present

## 2011-05-13 DIAGNOSIS — C3410 Malignant neoplasm of upper lobe, unspecified bronchus or lung: Secondary | ICD-10-CM | POA: Diagnosis not present

## 2011-05-13 DIAGNOSIS — I2510 Atherosclerotic heart disease of native coronary artery without angina pectoris: Secondary | ICD-10-CM | POA: Diagnosis not present

## 2011-05-13 DIAGNOSIS — J449 Chronic obstructive pulmonary disease, unspecified: Secondary | ICD-10-CM | POA: Diagnosis not present

## 2011-05-13 DIAGNOSIS — C3490 Malignant neoplasm of unspecified part of unspecified bronchus or lung: Secondary | ICD-10-CM | POA: Diagnosis not present

## 2011-05-13 DIAGNOSIS — E119 Type 2 diabetes mellitus without complications: Secondary | ICD-10-CM | POA: Diagnosis not present

## 2011-05-14 ENCOUNTER — Ambulatory Visit
Admission: RE | Admit: 2011-05-14 | Discharge: 2011-05-14 | Disposition: A | Discharge status: Home / Self Care | Payer: Medicare Other | Source: Ambulatory Visit | Attending: Radiation Oncology | Admitting: Radiation Oncology

## 2011-05-14 ENCOUNTER — Telehealth: Payer: Self-pay | Admitting: Pulmonary Disease

## 2011-05-14 ENCOUNTER — Ambulatory Visit: Payer: Medicare Other | Admitting: Nutrition

## 2011-05-14 DIAGNOSIS — K219 Gastro-esophageal reflux disease without esophagitis: Secondary | ICD-10-CM | POA: Diagnosis not present

## 2011-05-14 DIAGNOSIS — C3490 Malignant neoplasm of unspecified part of unspecified bronchus or lung: Secondary | ICD-10-CM | POA: Diagnosis not present

## 2011-05-14 DIAGNOSIS — C3410 Malignant neoplasm of upper lobe, unspecified bronchus or lung: Secondary | ICD-10-CM | POA: Diagnosis not present

## 2011-05-14 DIAGNOSIS — J449 Chronic obstructive pulmonary disease, unspecified: Secondary | ICD-10-CM | POA: Diagnosis not present

## 2011-05-14 DIAGNOSIS — R52 Pain, unspecified: Secondary | ICD-10-CM

## 2011-05-14 DIAGNOSIS — I2510 Atherosclerotic heart disease of native coronary artery without angina pectoris: Secondary | ICD-10-CM | POA: Diagnosis not present

## 2011-05-14 DIAGNOSIS — E119 Type 2 diabetes mellitus without complications: Secondary | ICD-10-CM | POA: Diagnosis not present

## 2011-05-14 DIAGNOSIS — Z510 Encounter for antineoplastic radiation therapy: Secondary | ICD-10-CM | POA: Diagnosis not present

## 2011-05-14 NOTE — Telephone Encounter (Signed)
lmomtcb x1 for pt--referral has been placed

## 2011-05-14 NOTE — Telephone Encounter (Signed)
Will also forward message to Dr. Kathrine Cords he checks his box prior to RB answering it.

## 2011-05-14 NOTE — Telephone Encounter (Signed)
Yes - you can make the referral in my name.

## 2011-05-14 NOTE — Telephone Encounter (Signed)
Called, spoke with pt.  Pt states she used to see Dr. Abigail Miyamoto for pain management but he will not see her anymore.  Pt requesting a referral to [facility].  She does not want to wait until Monday when Dr. Craige Cotta returns to office and would like someone in the office today to make this referral.  States she will not have a PCP until Feb 22.    Dr. Delton Coombes, pt states you have done a biopsy on her in the past.  Will you be willing to make this referral?  Please advise. Thanks!

## 2011-05-14 NOTE — Assessment & Plan Note (Signed)
[name] is a 66 year old female patient of Dr. Michell Heinrich diagnosed with non-small cell lung cancer.  HISTORY:  Includes hypertension, hyperlipidemia, diabetes, anemia, GERD, COPD, CAD, and PVD.  MEDICATIONS:  Include Xanax, calcium, vitamin D, Celexa, and Glucophage.  LABORATORIES:  Labs were reviewed.  HEIGHT:  66 inches. WEIGHT:  182.7 pounds. USUAL BODY WEIGHT:  187 pounds in November 2012. BMI:  29.5.  The patient reports feeling fatigued and weak.  She does report a poor appetite and constipation.  She is not normally hungry at breakfast, typically eats a pack of crackers.  She does try to eat a little bit of lunch and dinner.  She occasionally has a bedtime snack.  She reports that her physician told her she should not be walking at this time and she is concerned because she would like to "build herself up" for her upcoming chemotherapy.  NUTRITION DIAGNOSIS:  Food and nutrition-related knowledge deficit related to diagnosis of non-small-cell lung cancer and associated treatments as evidenced by no prior need for nutrition-related information.  INTERVENTION:  I have educated the patient and son on the importance of frequent small meals with adequate calories and protein to achieve weight maintenance.  I have encouraged the patient to consume a minimum of 6 small meals and snacks a day with a protein source at each meal or snack. I have provided her with a list of protein foods that she can choose from.  I have also educated her on strategies for helping her constipation to include increased fiber foods and plenty of fluids.  I have encouraged her to try Glucerna nutritional supplements to provide additional protein and calories twhen she is unable to find something that she wants to eat.  We briefly discussed the importance of eating a combination of protein and complex carbohydrate and fat to help with glycemic control.  I have provided her with fact sheets and supplement samples for her to  try and encouraged her to let me know what she does enjoy so that I can provide her with additional supplements as needed.  MONITORING/EVALUATION (GOALS):  That patient will tolerate small amounts of high-protein foods throughout the day to maintain weight throughout treatment.  NEXT VISIT:  The patient will contact me to let me know how she is doing and to followup as needed.    ______________________________ Zenovia Jarred, RD, LDN Clinical Nutrition Specialist BN/MEDQ  D:  05/14/2011  T:  05/14/2011  Job:  [id number]

## 2011-05-17 ENCOUNTER — Telehealth: Payer: Self-pay | Admitting: Nutrition

## 2011-05-17 ENCOUNTER — Ambulatory Visit
Admission: RE | Admit: 2011-05-17 | Discharge: 2011-05-17 | Disposition: A | Discharge status: Home / Self Care | Payer: Medicare Other | Source: Ambulatory Visit | Attending: Radiation Oncology | Admitting: Radiation Oncology

## 2011-05-17 DIAGNOSIS — K219 Gastro-esophageal reflux disease without esophagitis: Secondary | ICD-10-CM | POA: Diagnosis not present

## 2011-05-17 DIAGNOSIS — Z510 Encounter for antineoplastic radiation therapy: Secondary | ICD-10-CM | POA: Diagnosis not present

## 2011-05-17 DIAGNOSIS — C3410 Malignant neoplasm of upper lobe, unspecified bronchus or lung: Secondary | ICD-10-CM | POA: Diagnosis not present

## 2011-05-17 DIAGNOSIS — C3490 Malignant neoplasm of unspecified part of unspecified bronchus or lung: Secondary | ICD-10-CM | POA: Diagnosis not present

## 2011-05-17 DIAGNOSIS — E119 Type 2 diabetes mellitus without complications: Secondary | ICD-10-CM | POA: Diagnosis not present

## 2011-05-17 DIAGNOSIS — I2510 Atherosclerotic heart disease of native coronary artery without angina pectoris: Secondary | ICD-10-CM | POA: Diagnosis not present

## 2011-05-17 DIAGNOSIS — J449 Chronic obstructive pulmonary disease, unspecified: Secondary | ICD-10-CM | POA: Diagnosis not present

## 2011-05-17 NOTE — Telephone Encounter (Signed)
Pt aware. Corynn Solberg, CMA  

## 2011-05-17 NOTE — Telephone Encounter (Signed)
[name] called to let me know she tried both the chocolate and the strawberry Glucerna shakes and she tolerated the chocolate flavor better than the strawberry flavor.  I encouraged her to work to increase meals/snacks to 6 times daily and drink one to two Glucerna shakes daily.  I will provide her with additional samples of chocolate glucerna.

## 2011-05-18 ENCOUNTER — Ambulatory Visit
Admission: RE | Admit: 2011-05-18 | Discharge: 2011-05-18 | Disposition: A | Discharge status: Home / Self Care | Payer: Medicare Other | Source: Ambulatory Visit | Attending: Radiation Oncology | Admitting: Radiation Oncology

## 2011-05-18 DIAGNOSIS — E119 Type 2 diabetes mellitus without complications: Secondary | ICD-10-CM | POA: Diagnosis not present

## 2011-05-18 DIAGNOSIS — K219 Gastro-esophageal reflux disease without esophagitis: Secondary | ICD-10-CM | POA: Diagnosis not present

## 2011-05-18 DIAGNOSIS — I2510 Atherosclerotic heart disease of native coronary artery without angina pectoris: Secondary | ICD-10-CM | POA: Diagnosis not present

## 2011-05-18 DIAGNOSIS — C3410 Malignant neoplasm of upper lobe, unspecified bronchus or lung: Secondary | ICD-10-CM

## 2011-05-18 DIAGNOSIS — C3490 Malignant neoplasm of unspecified part of unspecified bronchus or lung: Secondary | ICD-10-CM | POA: Diagnosis not present

## 2011-05-18 DIAGNOSIS — J449 Chronic obstructive pulmonary disease, unspecified: Secondary | ICD-10-CM | POA: Diagnosis not present

## 2011-05-18 DIAGNOSIS — Z510 Encounter for antineoplastic radiation therapy: Secondary | ICD-10-CM | POA: Diagnosis not present

## 2011-05-18 NOTE — Progress Notes (Signed)
Weekly Management Note Current Dose: 35.1  Gy  Projected Dose: 70.2 Gy   Narrative:  The patient presents for routine under treatment assessment.  CBCT/MVCT images/Port film x-rays were reviewed.  The chart was checked.Some indigestion and coughing. Not using biafene. Gaining weight.   Physical Findings: Weight: 184 lb 1.6 oz (83.507 kg). Unchanged. No skin changes on back. O2 cannula in place  Impression:  The patient is tolerating radiation.  Plan:  Continue treatment as planned. Use biafene. Breathing stable for now.

## 2011-05-18 NOTE — Progress Notes (Signed)
o2 at 2 l/min prn.  Had problem with major indigestion last night , couldn't get breath.  Decreased urine output.  Dietician gave her some supplements and she has started them

## 2011-05-19 ENCOUNTER — Telehealth: Payer: Self-pay | Admitting: Pulmonary Disease

## 2011-05-19 ENCOUNTER — Ambulatory Visit
Admission: RE | Admit: 2011-05-19 | Discharge: 2011-05-19 | Disposition: A | Discharge status: Home / Self Care | Payer: Medicare Other | Source: Ambulatory Visit | Attending: Radiation Oncology | Admitting: Radiation Oncology

## 2011-05-19 DIAGNOSIS — C3410 Malignant neoplasm of upper lobe, unspecified bronchus or lung: Secondary | ICD-10-CM | POA: Diagnosis not present

## 2011-05-19 DIAGNOSIS — Z510 Encounter for antineoplastic radiation therapy: Secondary | ICD-10-CM | POA: Diagnosis not present

## 2011-05-19 DIAGNOSIS — J449 Chronic obstructive pulmonary disease, unspecified: Secondary | ICD-10-CM | POA: Diagnosis not present

## 2011-05-19 DIAGNOSIS — C3490 Malignant neoplasm of unspecified part of unspecified bronchus or lung: Secondary | ICD-10-CM | POA: Diagnosis not present

## 2011-05-19 DIAGNOSIS — K219 Gastro-esophageal reflux disease without esophagitis: Secondary | ICD-10-CM | POA: Diagnosis not present

## 2011-05-19 DIAGNOSIS — E119 Type 2 diabetes mellitus without complications: Secondary | ICD-10-CM | POA: Diagnosis not present

## 2011-05-19 DIAGNOSIS — I2510 Atherosclerotic heart disease of native coronary artery without angina pectoris: Secondary | ICD-10-CM | POA: Diagnosis not present

## 2011-05-19 NOTE — Telephone Encounter (Signed)
I spoke with pt and she states [employer] is needing some information to submit to medicare to pay for her oxygen. She stated I needed to speak with Chancy Hurter at [phone] (f) [phone]. I called [name] to see what all is needed. I have to leave VM TcB x1

## 2011-05-19 NOTE — Telephone Encounter (Signed)
[name] from [facility] returned [name]'s call & can be reached at [phone] ext [phone].  What she is requesting is office notes from last visit.  I put [name] in touch w/ Medical Records to help facilitate w/ this, but please follow through to ensure that she received these.  Thanks.  [name]

## 2011-05-19 NOTE — Telephone Encounter (Signed)
I spoke with [name] and she states she did receive the records she needed and they needed nothing further. Will sign off message

## 2011-05-20 ENCOUNTER — Ambulatory Visit
Admission: RE | Admit: 2011-05-20 | Discharge: 2011-05-20 | Disposition: A | Discharge status: Home / Self Care | Payer: Medicare Other | Source: Ambulatory Visit | Attending: Radiation Oncology | Admitting: Radiation Oncology

## 2011-05-20 ENCOUNTER — Ambulatory Visit: Payer: Medicare Other | Admitting: Internal Medicine

## 2011-05-20 ENCOUNTER — Telehealth: Payer: Self-pay | Admitting: Internal Medicine

## 2011-05-20 DIAGNOSIS — E119 Type 2 diabetes mellitus without complications: Secondary | ICD-10-CM | POA: Diagnosis not present

## 2011-05-20 DIAGNOSIS — C3490 Malignant neoplasm of unspecified part of unspecified bronchus or lung: Secondary | ICD-10-CM | POA: Diagnosis not present

## 2011-05-20 DIAGNOSIS — Z510 Encounter for antineoplastic radiation therapy: Secondary | ICD-10-CM | POA: Diagnosis not present

## 2011-05-20 DIAGNOSIS — K219 Gastro-esophageal reflux disease without esophagitis: Secondary | ICD-10-CM | POA: Diagnosis not present

## 2011-05-20 DIAGNOSIS — J449 Chronic obstructive pulmonary disease, unspecified: Secondary | ICD-10-CM | POA: Diagnosis not present

## 2011-05-20 DIAGNOSIS — I2510 Atherosclerotic heart disease of native coronary artery without angina pectoris: Secondary | ICD-10-CM | POA: Diagnosis not present

## 2011-05-20 DIAGNOSIS — C3410 Malignant neoplasm of upper lobe, unspecified bronchus or lung: Secondary | ICD-10-CM | POA: Diagnosis not present

## 2011-05-20 NOTE — Telephone Encounter (Signed)
She thinks our office called her. I confirmed appointment with her.

## 2011-05-21 ENCOUNTER — Ambulatory Visit
Admission: RE | Admit: 2011-05-21 | Discharge: 2011-05-21 | Disposition: A | Discharge status: Home / Self Care | Payer: Medicare Other | Source: Ambulatory Visit | Attending: Radiation Oncology | Admitting: Radiation Oncology

## 2011-05-21 DIAGNOSIS — K219 Gastro-esophageal reflux disease without esophagitis: Secondary | ICD-10-CM | POA: Diagnosis not present

## 2011-05-21 DIAGNOSIS — I2510 Atherosclerotic heart disease of native coronary artery without angina pectoris: Secondary | ICD-10-CM | POA: Diagnosis not present

## 2011-05-21 DIAGNOSIS — Z510 Encounter for antineoplastic radiation therapy: Secondary | ICD-10-CM | POA: Diagnosis not present

## 2011-05-21 DIAGNOSIS — C3410 Malignant neoplasm of upper lobe, unspecified bronchus or lung: Secondary | ICD-10-CM | POA: Diagnosis not present

## 2011-05-21 DIAGNOSIS — C3490 Malignant neoplasm of unspecified part of unspecified bronchus or lung: Secondary | ICD-10-CM | POA: Diagnosis not present

## 2011-05-21 DIAGNOSIS — J449 Chronic obstructive pulmonary disease, unspecified: Secondary | ICD-10-CM | POA: Diagnosis not present

## 2011-05-21 DIAGNOSIS — E119 Type 2 diabetes mellitus without complications: Secondary | ICD-10-CM | POA: Diagnosis not present

## 2011-05-24 ENCOUNTER — Ambulatory Visit
Admission: RE | Admit: 2011-05-24 | Discharge: 2011-05-24 | Disposition: A | Discharge status: Home / Self Care | Payer: Medicare Other | Source: Ambulatory Visit | Attending: Radiation Oncology | Admitting: Radiation Oncology

## 2011-05-24 DIAGNOSIS — K219 Gastro-esophageal reflux disease without esophagitis: Secondary | ICD-10-CM | POA: Diagnosis not present

## 2011-05-24 DIAGNOSIS — C3410 Malignant neoplasm of upper lobe, unspecified bronchus or lung: Secondary | ICD-10-CM | POA: Diagnosis not present

## 2011-05-24 DIAGNOSIS — E119 Type 2 diabetes mellitus without complications: Secondary | ICD-10-CM | POA: Diagnosis not present

## 2011-05-24 DIAGNOSIS — I2510 Atherosclerotic heart disease of native coronary artery without angina pectoris: Secondary | ICD-10-CM | POA: Diagnosis not present

## 2011-05-24 DIAGNOSIS — C3490 Malignant neoplasm of unspecified part of unspecified bronchus or lung: Secondary | ICD-10-CM | POA: Diagnosis not present

## 2011-05-24 DIAGNOSIS — Z510 Encounter for antineoplastic radiation therapy: Secondary | ICD-10-CM | POA: Diagnosis not present

## 2011-05-24 DIAGNOSIS — J449 Chronic obstructive pulmonary disease, unspecified: Secondary | ICD-10-CM | POA: Diagnosis not present

## 2011-05-25 ENCOUNTER — Ambulatory Visit
Admission: RE | Admit: 2011-05-25 | Discharge: 2011-05-25 | Disposition: A | Discharge status: Home / Self Care | Payer: Medicare Other | Source: Ambulatory Visit | Attending: Radiation Oncology | Admitting: Radiation Oncology

## 2011-05-25 DIAGNOSIS — C3490 Malignant neoplasm of unspecified part of unspecified bronchus or lung: Secondary | ICD-10-CM | POA: Diagnosis not present

## 2011-05-25 DIAGNOSIS — K219 Gastro-esophageal reflux disease without esophagitis: Secondary | ICD-10-CM | POA: Diagnosis not present

## 2011-05-25 DIAGNOSIS — E119 Type 2 diabetes mellitus without complications: Secondary | ICD-10-CM | POA: Diagnosis not present

## 2011-05-25 DIAGNOSIS — C3410 Malignant neoplasm of upper lobe, unspecified bronchus or lung: Secondary | ICD-10-CM

## 2011-05-25 DIAGNOSIS — I2510 Atherosclerotic heart disease of native coronary artery without angina pectoris: Secondary | ICD-10-CM | POA: Diagnosis not present

## 2011-05-25 DIAGNOSIS — Z510 Encounter for antineoplastic radiation therapy: Secondary | ICD-10-CM | POA: Diagnosis not present

## 2011-05-25 DIAGNOSIS — J449 Chronic obstructive pulmonary disease, unspecified: Secondary | ICD-10-CM | POA: Diagnosis not present

## 2011-05-25 NOTE — Progress Notes (Signed)
C/O ITCHING ON UPPER RIGHT BACK, NOTED DEEP RED AREA FROM TX.  HAS COUGHING IN AM WITH WHITE MUCUS.

## 2011-05-25 NOTE — Progress Notes (Signed)
Weekly Management Note Current Dose: 48.6  Gy  Projected Dose: 70.2 Gy   Narrative:  The patient presents for routine under treatment assessment.  CBCT/MVCT images/Port film x-rays were reviewed.  The chart was checked. Doing well except for posterior skin irritation. Noticed it over the weekend.  Using radiaplex now and asked therapists to do it over the weekend.   Physical Findings:  Red dry desquamation over right back. Chest appears clear.   Impression:  The patient is tolerating radiation.  Plan:  Continue treatment as planned. Enforced skin care. Asked Dr. Stacie Glaze to evaluate patient skin reaction and review plan.

## 2011-05-26 ENCOUNTER — Ambulatory Visit (HOSPITAL_BASED_OUTPATIENT_CLINIC_OR_DEPARTMENT_OTHER): Payer: Medicare Other | Admitting: Internal Medicine

## 2011-05-26 ENCOUNTER — Other Ambulatory Visit: Payer: Medicare Other | Admitting: Lab

## 2011-05-26 ENCOUNTER — Ambulatory Visit
Admission: RE | Admit: 2011-05-26 | Discharge: 2011-05-26 | Disposition: A | Discharge status: Home / Self Care | Payer: Medicare Other | Source: Ambulatory Visit | Attending: Radiation Oncology | Admitting: Radiation Oncology

## 2011-05-26 VITALS — BP 140/74 | HR 91 | Temp 97.2°F | Ht 66.0 in | Wt 184.1 lb

## 2011-05-26 DIAGNOSIS — K219 Gastro-esophageal reflux disease without esophagitis: Secondary | ICD-10-CM | POA: Diagnosis not present

## 2011-05-26 DIAGNOSIS — J449 Chronic obstructive pulmonary disease, unspecified: Secondary | ICD-10-CM | POA: Diagnosis not present

## 2011-05-26 DIAGNOSIS — C3490 Malignant neoplasm of unspecified part of unspecified bronchus or lung: Secondary | ICD-10-CM | POA: Diagnosis not present

## 2011-05-26 DIAGNOSIS — Z510 Encounter for antineoplastic radiation therapy: Secondary | ICD-10-CM | POA: Diagnosis not present

## 2011-05-26 DIAGNOSIS — D649 Anemia, unspecified: Secondary | ICD-10-CM

## 2011-05-26 DIAGNOSIS — E119 Type 2 diabetes mellitus without complications: Secondary | ICD-10-CM | POA: Diagnosis not present

## 2011-05-26 DIAGNOSIS — I2510 Atherosclerotic heart disease of native coronary artery without angina pectoris: Secondary | ICD-10-CM | POA: Diagnosis not present

## 2011-05-26 DIAGNOSIS — C3410 Malignant neoplasm of upper lobe, unspecified bronchus or lung: Secondary | ICD-10-CM | POA: Diagnosis not present

## 2011-05-26 LAB — COMPREHENSIVE METABOLIC PANEL
CO2: 25 mEq/L (ref 19–32)
Calcium: 9.7 mg/dL (ref 8.4–10.5)
Creatinine, Ser: 0.77 mg/dL (ref 0.50–1.10)
Glucose, Bld: 102 mg/dL — ABNORMAL HIGH (ref 70–99)
Total Bilirubin: 0.4 mg/dL (ref 0.3–1.2)
Total Protein: 6.5 g/dL (ref 6.0–8.3)

## 2011-05-26 LAB — CBC WITH DIFFERENTIAL/PLATELET
Eosinophils Absolute: 0.3 10*3/uL (ref 0.0–0.5)
HCT: 31.7 % — ABNORMAL LOW (ref 34.8–46.6)
HGB: 10.4 g/dL — ABNORMAL LOW (ref 11.6–15.9)
LYMPH%: 15 % (ref 14.0–49.7)
MONO#: 0.7 10*3/uL (ref 0.1–0.9)
NEUT#: 4.8 10*3/uL (ref 1.5–6.5)
NEUT%: 69.7 % (ref 38.4–76.8)
Platelets: 299 10*3/uL (ref 145–400)
WBC: 6.9 10*3/uL (ref 3.9–10.3)

## 2011-05-26 MED ORDER — CYANOCOBALAMIN 1000 MCG/ML IJ SOLN
1000.0000 ug | Freq: Once | INTRAMUSCULAR | Status: AC
  Administered 2011-05-26: 1000 ug via INTRAMUSCULAR

## 2011-05-26 NOTE — Progress Notes (Signed)
[facility] OFFICE PROGRESS NOTE  DIAGNOSIS: Stage IIB/IIIA non-small cell lung cancer consistent with adenocarcinoma diagnosed in December 2012.  PRIOR THERAPY: None  CURRENT THERAPY: Radiotherapy under the care of Dr. Michell Heinrich to the right upper lobe lung mass expected to be completed on 06/04/2011  INTERVAL HISTORY: [name] 66 y.o. female returns to the clinic today for followup visit accompanied her daughter. The patient is tolerating her radiotherapy fairly well except for erythema and itching on the skin of the back at the area of the radiotherapy. She denied having any significant chest pain or shortness of breath today. She has no significant weight loss or night sweats. She is here today for evaluation and discussion of her systemic chemotherapy option. The patient had MRI of the brain that showed no evidence for metastatic disease.  MEDICAL HISTORY: Past Medical History  Diagnosis Date  . Polyneuropathy in diabetes   . Restless legs syndrome (RLS)   . Chronic sinusitis   . Hypertension   . Hyperlipidemia   . Type II or unspecified type diabetes mellitus without mention of complication, not stated as uncontrolled   . Anemia, unspecified   . GERD (gastroesophageal reflux disease)   . Respiratory failure with hypoxia 01/02/2010  . Complication of anesthesia     slow to wake up  . Adenocarcinoma, lung     upper left lobe  . COPD (chronic obstructive pulmonary disease)   . Emphysema   . Asthma   . Coronary artery disease     Dr. Excell Seltzer had stress test done  . Dysrhythmia     unsure what type  . Peripheral vascular disease   . Non-small cell lung cancer 03/31/2011    Adenocarcinoma Rt upper lobe mass    ALLERGIES:  is allergic to iohexol; codeine; and penicillins.  MEDICATIONS:  Current Outpatient Prescriptions  Medication Sig Dispense Refill  . albuterol (PROAIR HFA) 108 (90 BASE) MCG/ACT inhaler Inhale 2 puffs into the lungs every 6  (six) hours as needed. For shortness of breath      . albuterol-ipratropium (COMBIVENT) 18-103 MCG/ACT inhaler Inhale 2 puffs into the lungs every 6 (six) hours as needed. For shortness of breath      . ALPRAZolam (XANAX) 1 MG tablet Take 1 mg by mouth 3 (three) times daily as needed. For anxiety      . aspirin EC 325 MG tablet Take 325 mg by mouth daily.        . calcium carbonate 200 MG capsule Take 1,000 mg by mouth daily. Does not take everyday       . cholecalciferol (VITAMIN D) 1000 UNITS tablet Take 1,000 Units by mouth daily. Dose varies and pt does not take every day       . citalopram (CELEXA) 40 MG tablet Take 1 tablet by mouth at bedtime.      Marland Kitchen diltiazem (CARDIZEM CD) 240 MG 24 hr capsule Take 1 capsule (240 mg total) by mouth daily.  30 capsule  3  . Fluticasone-Salmeterol (ADVAIR) 250-50 MCG/DOSE AEPB Inhale 1 puff into the lungs every 12 (twelve) hours.      Marland Kitchen gemfibrozil (LOPID) 600 MG tablet Take 600 mg by mouth 2 (two) times daily before a meal.        . guaiFENesin (MUCINEX) 600 MG 12 hr tablet Take 600 mg by mouth daily.      Marland Kitchen HYDROcodone-acetaminophen (VICODIN ES) 7.5-750 MG per tablet Take 1 tablet by  mouth every 6 (six) hours as needed. For severe pain      . losartan (COZAAR) 50 MG tablet Take 50 mg by mouth 2 (two) times daily.        . metFORMIN (GLUCOPHAGE) 500 MG tablet Take 500 mg by mouth 2 (two) times daily with a meal.       . NON FORMULARY Oxygen 2L prn      . omeprazole (PRILOSEC) 20 MG capsule Take 20 mg by mouth 2 (two) times daily.        . polyethylene glycol (MIRALAX / GLYCOLAX) packet Take 17 g by mouth daily as needed.      . potassium chloride SA (K-DUR,KLOR-CON) 20 MEQ tablet Take 1 tablet (20 mEq total) by mouth 2 (two) times daily.  60 tablet  6  . traMADol (ULTRAM) 50 MG tablet Take 50 mg by mouth every 4 (four) hours as needed. For moderate pain      . triamterene-hydrochlorothiazide (MAXZIDE-25) 37.5-25 MG per tablet Take 1 tablet by mouth daily.        Current Facility-Administered Medications  Medication Dose Route Frequency Provider Last Rate Last Dose  . cyanocobalamin ((VITAMIN B-12)) injection 1,000 mcg  1,000 mcg Intramuscular Once Ajai Harville K. Shamirah Ivan, MD   1,000 mcg at 05/26/11 1448   Facility-Administered Medications Ordered in Other Visits  Medication Dose Route Frequency Provider Last Rate Last Dose  . topical emolient (BIAFINE) emulsion   Topical PRN Lurline Hare, MD        SURGICAL HISTORY:  Past Surgical History  Procedure Date  . Lung lobectomy     left  . Septoplasty   . Total hip arthroplasty 01/2010    left  . Bronchoscopy 02/2011    REVIEW OF SYSTEMS:  A comprehensive review of systems was negative.   PHYSICAL EXAMINATION: General appearance: alert, cooperative and no distress Neck: no adenopathy Lymph nodes: Cervical, supraclavicular, and axillary nodes normal. Resp: clear to auscultation bilaterally Cardio: regular rate and rhythm, S1, S2 normal, no murmur, click, rub or gallop GI: soft, non-tender; bowel sounds normal; no masses,  no organomegaly Extremities: extremities normal, atraumatic, no cyanosis or edema Neurologic: Alert and oriented X 3, normal strength and tone. Normal symmetric reflexes. Normal coordination and gait  ECOG PERFORMANCE STATUS: 1 - Symptomatic but completely ambulatory  Blood pressure 140/74, pulse 91, temperature 97.2 F (36.2 C), temperature source Oral, height 5\' 6"  (1.676 m), weight 184 lb 1.6 oz (83.507 kg).  LABORATORY DATA: Lab Results  Component Value Date   WBC 6.9 05/26/2011   HGB 10.4* 05/26/2011   HCT 31.7* 05/26/2011   MCV 82.6 05/26/2011   PLT 299 05/26/2011      Chemistry      Component Value Date/Time   NA 137 04/23/2011 1032   K 3.8 04/23/2011 1032   CL 99 04/23/2011 1032   CO2 28 04/23/2011 1032   BUN 10 04/23/2011 1032   CREATININE 0.81 04/23/2011 1032      Component Value Date/Time   CALCIUM 9.8 04/23/2011 1032   ALKPHOS 99 04/23/2011 1032   AST  20 04/23/2011 1032   ALT 13 04/23/2011 1032   BILITOT 0.2* 04/23/2011 1032       RADIOGRAPHIC STUDIES: No results found.  ASSESSMENT: This is a very pleasant 66 years old white female with a stage IIb/IIIa non-small cell lung cancer consistent with adenocarcinoma. She is currently undergoing curative radiotherapy to the right upper lobe lung nodule. I have a lengthy discussion again with  the patient and her daughter about her systemic chemotherapy option.  PLAN: I recommended for her at least 4 cycles of systemic chemotherapy with carboplatin for AUC of 5 and Alimta 500 mg/M2 giving every 3 weeks. I discussed with the patient adverse effect of the treatment including but not limited to alopecia, myelosuppression, peripheral neuropathy, nausea and vomiting as well as liver or renal dysfunction. The patient receive vitamin B12 injection today. She was also given prescription for Compazine 10 mg by mouth every 6 hours as needed for nausea/vomiting, Decadron 4 mg by mouth twice a day day before, day of and day after the chemotherapy in addition to folic acid 1 mg by mouth daily.  For the anemia, she was advised to start over-the-counter iron tablet 1-2 tablets every day. The patient would have a chemotherapy education class next week. She is expected to start the first cycle of her systemic chemotherapy on 06/09/2011. She would come back for followup visit in 3 weeks for reevaluation and management any adverse effect of her chemotherapy.  The patient was advised to call me immediately if she has any concerning symptoms in the interval.   All questions were answered. The patient knows to call the clinic with any problems, questions or concerns. We can certainly see the patient much sooner if necessary.  I spent 20 minutes counseling the patient face to face. The total time spent in the appointment was 40 minutes.

## 2011-05-27 ENCOUNTER — Other Ambulatory Visit: Payer: Medicare Other | Admitting: Lab

## 2011-05-27 ENCOUNTER — Telehealth: Payer: Self-pay | Admitting: Internal Medicine

## 2011-05-27 ENCOUNTER — Ambulatory Visit: Payer: Medicare Other | Admitting: Internal Medicine

## 2011-05-27 ENCOUNTER — Ambulatory Visit
Admission: RE | Admit: 2011-05-27 | Discharge: 2011-05-27 | Disposition: A | Discharge status: Home / Self Care | Payer: Medicare Other | Source: Ambulatory Visit | Attending: Radiation Oncology | Admitting: Radiation Oncology

## 2011-05-27 DIAGNOSIS — C3410 Malignant neoplasm of upper lobe, unspecified bronchus or lung: Secondary | ICD-10-CM

## 2011-05-27 DIAGNOSIS — Z510 Encounter for antineoplastic radiation therapy: Secondary | ICD-10-CM | POA: Diagnosis not present

## 2011-05-27 DIAGNOSIS — C3490 Malignant neoplasm of unspecified part of unspecified bronchus or lung: Secondary | ICD-10-CM | POA: Diagnosis not present

## 2011-05-27 DIAGNOSIS — I2510 Atherosclerotic heart disease of native coronary artery without angina pectoris: Secondary | ICD-10-CM | POA: Diagnosis not present

## 2011-05-27 DIAGNOSIS — K219 Gastro-esophageal reflux disease without esophagitis: Secondary | ICD-10-CM | POA: Diagnosis not present

## 2011-05-27 DIAGNOSIS — E119 Type 2 diabetes mellitus without complications: Secondary | ICD-10-CM | POA: Diagnosis not present

## 2011-05-27 DIAGNOSIS — J449 Chronic obstructive pulmonary disease, unspecified: Secondary | ICD-10-CM | POA: Diagnosis not present

## 2011-05-27 MED ORDER — RADIAPLEXRX EX GEL
Freq: Once | CUTANEOUS | Status: AC
  Administered 2011-05-27: 10:00:00 via TOPICAL

## 2011-05-27 MED ORDER — RADIAPLEXRX EX GEL: Freq: Once | CUTANEOUS | Status: AC

## 2011-05-27 NOTE — Telephone Encounter (Signed)
called pt with appts and she will p/u sch today at rad onc appt aom

## 2011-05-27 NOTE — Progress Notes (Signed)
Encounter addended by: Amanda Pea, RN on: 05/27/2011  5:28 PM<BR>     Documentation filed: Visit Diagnoses, Orders

## 2011-05-28 ENCOUNTER — Ambulatory Visit
Admission: RE | Admit: 2011-05-28 | Discharge: 2011-05-28 | Disposition: A | Discharge status: Home / Self Care | Payer: Medicare Other | Source: Ambulatory Visit | Attending: Radiation Oncology | Admitting: Radiation Oncology

## 2011-05-28 DIAGNOSIS — J449 Chronic obstructive pulmonary disease, unspecified: Secondary | ICD-10-CM | POA: Diagnosis not present

## 2011-05-28 DIAGNOSIS — E119 Type 2 diabetes mellitus without complications: Secondary | ICD-10-CM | POA: Diagnosis not present

## 2011-05-28 DIAGNOSIS — Z510 Encounter for antineoplastic radiation therapy: Secondary | ICD-10-CM | POA: Diagnosis not present

## 2011-05-28 DIAGNOSIS — C3410 Malignant neoplasm of upper lobe, unspecified bronchus or lung: Secondary | ICD-10-CM | POA: Diagnosis not present

## 2011-05-28 DIAGNOSIS — K219 Gastro-esophageal reflux disease without esophagitis: Secondary | ICD-10-CM | POA: Diagnosis not present

## 2011-05-28 DIAGNOSIS — C3490 Malignant neoplasm of unspecified part of unspecified bronchus or lung: Secondary | ICD-10-CM | POA: Diagnosis not present

## 2011-05-28 DIAGNOSIS — I2510 Atherosclerotic heart disease of native coronary artery without angina pectoris: Secondary | ICD-10-CM | POA: Diagnosis not present

## 2011-05-31 ENCOUNTER — Ambulatory Visit
Admission: RE | Admit: 2011-05-31 | Discharge: 2011-05-31 | Disposition: A | Discharge status: Home / Self Care | Payer: Medicare Other | Source: Ambulatory Visit | Attending: Radiation Oncology | Admitting: Radiation Oncology

## 2011-05-31 DIAGNOSIS — K219 Gastro-esophageal reflux disease without esophagitis: Secondary | ICD-10-CM | POA: Diagnosis not present

## 2011-05-31 DIAGNOSIS — J449 Chronic obstructive pulmonary disease, unspecified: Secondary | ICD-10-CM | POA: Diagnosis not present

## 2011-05-31 DIAGNOSIS — E119 Type 2 diabetes mellitus without complications: Secondary | ICD-10-CM | POA: Diagnosis not present

## 2011-05-31 DIAGNOSIS — C3490 Malignant neoplasm of unspecified part of unspecified bronchus or lung: Secondary | ICD-10-CM | POA: Diagnosis not present

## 2011-05-31 DIAGNOSIS — I2510 Atherosclerotic heart disease of native coronary artery without angina pectoris: Secondary | ICD-10-CM | POA: Diagnosis not present

## 2011-05-31 DIAGNOSIS — Z510 Encounter for antineoplastic radiation therapy: Secondary | ICD-10-CM | POA: Diagnosis not present

## 2011-06-01 ENCOUNTER — Ambulatory Visit: Payer: Medicare Other

## 2011-06-02 ENCOUNTER — Ambulatory Visit
Admission: RE | Admit: 2011-06-02 | Discharge: 2011-06-02 | Disposition: A | Discharge status: Home / Self Care | Payer: Medicare Other | Source: Ambulatory Visit | Attending: Radiation Oncology | Admitting: Radiation Oncology

## 2011-06-02 DIAGNOSIS — J449 Chronic obstructive pulmonary disease, unspecified: Secondary | ICD-10-CM | POA: Diagnosis not present

## 2011-06-02 DIAGNOSIS — E119 Type 2 diabetes mellitus without complications: Secondary | ICD-10-CM | POA: Diagnosis not present

## 2011-06-02 DIAGNOSIS — C3490 Malignant neoplasm of unspecified part of unspecified bronchus or lung: Secondary | ICD-10-CM | POA: Diagnosis not present

## 2011-06-02 DIAGNOSIS — Z510 Encounter for antineoplastic radiation therapy: Secondary | ICD-10-CM | POA: Diagnosis not present

## 2011-06-02 DIAGNOSIS — K219 Gastro-esophageal reflux disease without esophagitis: Secondary | ICD-10-CM | POA: Diagnosis not present

## 2011-06-02 DIAGNOSIS — I2510 Atherosclerotic heart disease of native coronary artery without angina pectoris: Secondary | ICD-10-CM | POA: Diagnosis not present

## 2011-06-03 ENCOUNTER — Encounter: Payer: Self-pay | Admitting: Radiation Oncology

## 2011-06-03 ENCOUNTER — Other Ambulatory Visit: Payer: Medicare Other

## 2011-06-03 ENCOUNTER — Encounter: Payer: Self-pay | Admitting: *Deleted

## 2011-06-03 ENCOUNTER — Ambulatory Visit
Admission: RE | Admit: 2011-06-03 | Discharge: 2011-06-03 | Disposition: A | Discharge status: Home / Self Care | Payer: Medicare Other | Source: Ambulatory Visit | Attending: Radiation Oncology | Admitting: Radiation Oncology

## 2011-06-03 VITALS — Wt 182.9 lb

## 2011-06-03 DIAGNOSIS — Z510 Encounter for antineoplastic radiation therapy: Secondary | ICD-10-CM | POA: Diagnosis not present

## 2011-06-03 DIAGNOSIS — C3410 Malignant neoplasm of upper lobe, unspecified bronchus or lung: Secondary | ICD-10-CM | POA: Diagnosis not present

## 2011-06-03 DIAGNOSIS — I2510 Atherosclerotic heart disease of native coronary artery without angina pectoris: Secondary | ICD-10-CM | POA: Diagnosis not present

## 2011-06-03 DIAGNOSIS — K219 Gastro-esophageal reflux disease without esophagitis: Secondary | ICD-10-CM | POA: Diagnosis not present

## 2011-06-03 DIAGNOSIS — E119 Type 2 diabetes mellitus without complications: Secondary | ICD-10-CM | POA: Diagnosis not present

## 2011-06-03 DIAGNOSIS — J449 Chronic obstructive pulmonary disease, unspecified: Secondary | ICD-10-CM | POA: Diagnosis not present

## 2011-06-03 DIAGNOSIS — C3490 Malignant neoplasm of unspecified part of unspecified bronchus or lung: Secondary | ICD-10-CM | POA: Diagnosis not present

## 2011-06-03 MED ORDER — SUCRALFATE 1 G PO TABS: 1.0000 g | ORAL_TABLET | Freq: Four times a day (QID) | ORAL | Status: DC

## 2011-06-03 NOTE — Progress Notes (Signed)
Pt reports sore throat but appetite good, drinks Glucerna 1- 2 cans/day. Fatigued. Applying radiaplex to upper R back for dry desquamation, good relief of irritation. Morning prod cough, white/clear sputum. O2 @ 2L/min prn during day, nightly.  Pt did not come in for tx 06/01/11 due to increased fatigue that day.

## 2011-06-04 ENCOUNTER — Telehealth: Payer: Self-pay | Admitting: Radiation Oncology

## 2011-06-04 ENCOUNTER — Ambulatory Visit: Payer: Medicare Other

## 2011-06-04 NOTE — Telephone Encounter (Signed)
Patient phoned reporting [facility] had not received escript for Carafate as promised by Dr. Mitzi Hansen. EPIC record shows escript was done as promised. Phoned Cere at [facility]. Cere confirmed escript had not been received. Per Dr. Joellen Jersey order gave verbal script for Carafate 1 gram four times a day dissolve in 15 cc water, qty  120 with one refill. Cere verbalized this prescription would be ready for pick up today. Informed patient of this. Also, informed Dr. Mitzi Hansen of this occurrence.

## 2011-06-05 NOTE — Progress Notes (Signed)
[facility] Radiation Oncology Weekly Treatment Note    Name: [name] Date: 06/05/2011 MRN: [id number] DOB: 05-Oct-1945  Status: outpatient    Current dose: 6480  Current fraction:24  Planned dose:7020  Planned fraction:26   MEDICATIONS: Current Outpatient Prescriptions  Medication Sig Dispense Refill  . albuterol (PROAIR HFA) 108 (90 BASE) MCG/ACT inhaler Inhale 2 puffs into the lungs every 6 (six) hours as needed. For shortness of breath      . albuterol-ipratropium (COMBIVENT) 18-103 MCG/ACT inhaler Inhale 2 puffs into the lungs every 6 (six) hours as needed. For shortness of breath      . ALPRAZolam (XANAX) 1 MG tablet Take 1 mg by mouth 3 (three) times daily as needed. For anxiety      . aspirin EC 325 MG tablet Take 325 mg by mouth daily.        . calcium carbonate 200 MG capsule Take 1,000 mg by mouth daily. Does not take everyday       . cholecalciferol (VITAMIN D) 1000 UNITS tablet Take 1,000 Units by mouth daily. Dose varies and pt does not take every day       . citalopram (CELEXA) 40 MG tablet Take 1 tablet by mouth at bedtime.      Marland Kitchen diltiazem (CARDIZEM CD) 240 MG 24 hr capsule Take 1 capsule (240 mg total) by mouth daily.  30 capsule  3  . Fluticasone-Salmeterol (ADVAIR) 250-50 MCG/DOSE AEPB Inhale 1 puff into the lungs every 12 (twelve) hours.      . folic acid (FOLVITE) 1 MG tablet Take 1 mg by mouth daily.      Marland Kitchen gemfibrozil (LOPID) 600 MG tablet Take 600 mg by mouth 2 (two) times daily before a meal.        . guaiFENesin (MUCINEX) 600 MG 12 hr tablet Take 600 mg by mouth daily.      Marland Kitchen HYDROcodone-acetaminophen (VICODIN ES) 7.5-750 MG per tablet Take 1 tablet by mouth every 6 (six) hours as needed. For severe pain      . losartan (COZAAR) 50 MG tablet Take 50 mg by mouth 2 (two) times daily.        . metFORMIN (GLUCOPHAGE) 500 MG tablet Take 500 mg by mouth 2 (two) times daily with a meal.       . NON FORMULARY Oxygen 2L prn      . omeprazole  (PRILOSEC) 20 MG capsule Take 20 mg by mouth 2 (two) times daily.        . polyethylene glycol (MIRALAX / GLYCOLAX) packet Take 17 g by mouth daily as needed.      . potassium chloride SA (K-DUR,KLOR-CON) 20 MEQ tablet Take 1 tablet (20 mEq total) by mouth 2 (two) times daily.  60 tablet  6  . prochlorperazine (COMPAZINE) 10 MG tablet Take 10 mg by mouth every 6 (six) hours as needed.      . traMADol (ULTRAM) 50 MG tablet Take 50 mg by mouth every 4 (four) hours as needed. For moderate pain      . triamterene-hydrochlorothiazide (MAXZIDE-25) 37.5-25 MG per tablet Take 1 tablet by mouth daily.      . sucralfate (CARAFATE) 1 G tablet Take 1 tablet (1 g total) by mouth 4 (four) times daily. Dissolve in 15 ml water.  90 tablet  1   No current facility-administered medications for this encounter.   Facility-Administered Medications Ordered in Other Encounters  Medication Dose Route Frequency Provider Last Rate Last Dose  .  hyaluronate sodium (RADIAPLEXRX) gel   Topical Once Lurline Hare, MD      . topical emolient (BIAFINE) emulsion   Topical PRN Lurline Hare, MD         ALLERGIES: Iohexol; Codeine; and Penicillins   LABORATORY DATA:  Lab Results  Component Value Date   WBC 6.9 05/26/2011   HGB 10.4* 05/26/2011   HCT 31.7* 05/26/2011   MCV 82.6 05/26/2011   PLT 299 05/26/2011   Lab Results  Component Value Date   NA 140 05/26/2011   K 4.6 05/26/2011   CL 102 05/26/2011   CO2 25 05/26/2011   Lab Results  Component Value Date   ALT 16 05/26/2011   AST 25 05/26/2011   ALKPHOS 96 05/26/2011   BILITOT 0.4 05/26/2011      NARRATIVE: [name] was seen today for weekly treatment management. The chart was checked and CBCT images were reviewed. Fatigued. Some skin irritation. C/o esophagitis.  PHYSICAL EXAMINATION: weight is 182 lb 14.4 oz (82.963 kg).    Dry desquamation present posteriorly   ASSESSMENT: Patient tolerating treatments well.    PLAN: Continue treatment as planned.  Given carafate.

## 2011-06-07 ENCOUNTER — Ambulatory Visit
Admission: RE | Admit: 2011-06-07 | Discharge: 2011-06-07 | Disposition: A | Discharge status: Home / Self Care | Payer: Medicare Other | Source: Ambulatory Visit | Attending: Radiation Oncology | Admitting: Radiation Oncology

## 2011-06-07 DIAGNOSIS — I2510 Atherosclerotic heart disease of native coronary artery without angina pectoris: Secondary | ICD-10-CM | POA: Diagnosis not present

## 2011-06-07 DIAGNOSIS — E119 Type 2 diabetes mellitus without complications: Secondary | ICD-10-CM | POA: Diagnosis not present

## 2011-06-07 DIAGNOSIS — Z510 Encounter for antineoplastic radiation therapy: Secondary | ICD-10-CM | POA: Diagnosis not present

## 2011-06-07 DIAGNOSIS — C3410 Malignant neoplasm of upper lobe, unspecified bronchus or lung: Secondary | ICD-10-CM | POA: Diagnosis not present

## 2011-06-07 DIAGNOSIS — K219 Gastro-esophageal reflux disease without esophagitis: Secondary | ICD-10-CM | POA: Diagnosis not present

## 2011-06-07 DIAGNOSIS — J449 Chronic obstructive pulmonary disease, unspecified: Secondary | ICD-10-CM | POA: Diagnosis not present

## 2011-06-07 DIAGNOSIS — C3490 Malignant neoplasm of unspecified part of unspecified bronchus or lung: Secondary | ICD-10-CM | POA: Diagnosis not present

## 2011-06-08 ENCOUNTER — Encounter: Payer: Self-pay | Admitting: Radiation Oncology

## 2011-06-08 ENCOUNTER — Ambulatory Visit
Admission: RE | Admit: 2011-06-08 | Discharge: 2011-06-08 | Disposition: A | Discharge status: Home / Self Care | Payer: Medicare Other | Source: Ambulatory Visit | Attending: Radiation Oncology | Admitting: Radiation Oncology

## 2011-06-08 DIAGNOSIS — C3410 Malignant neoplasm of upper lobe, unspecified bronchus or lung: Secondary | ICD-10-CM | POA: Diagnosis not present

## 2011-06-08 DIAGNOSIS — C3490 Malignant neoplasm of unspecified part of unspecified bronchus or lung: Secondary | ICD-10-CM | POA: Diagnosis not present

## 2011-06-08 DIAGNOSIS — J449 Chronic obstructive pulmonary disease, unspecified: Secondary | ICD-10-CM | POA: Diagnosis not present

## 2011-06-08 DIAGNOSIS — I2510 Atherosclerotic heart disease of native coronary artery without angina pectoris: Secondary | ICD-10-CM | POA: Diagnosis not present

## 2011-06-08 DIAGNOSIS — E119 Type 2 diabetes mellitus without complications: Secondary | ICD-10-CM | POA: Diagnosis not present

## 2011-06-08 DIAGNOSIS — K219 Gastro-esophageal reflux disease without esophagitis: Secondary | ICD-10-CM | POA: Diagnosis not present

## 2011-06-08 DIAGNOSIS — Z510 Encounter for antineoplastic radiation therapy: Secondary | ICD-10-CM | POA: Diagnosis not present

## 2011-06-08 NOTE — Progress Notes (Signed)
Weekly Management Note Current Dose:70.2 Gy  Projected Dose:70.2 Gy   Narrative:  The patient presents for routine under treatment assessment.  CBCT/MVCT images/Port film x-rays were reviewed.  The chart was checked. Doing well. Some skin irritation along right breast and middle of chest.  Back is not hurting. Using radiaplex.  Carafate helping.  Had productive cough and chills last night. No fever. Is not going to call Dr. Craige Cotta yet.  Will wait to see if gets worse tonight.  Ready for chemo tomorrow.   Physical Findings:  Doing well. Dry desquamation over back. Some red spots along chest and breast.   Vitals:  Filed Vitals:   06/08/11 1445  BP: 144/73  Pulse: 107  Temp: 98.2 F (36.8 C)  Resp: 24   Weight:  Wt Readings from Last 3 Encounters:  06/08/11 182 lb 9.6 oz (82.827 kg)  06/03/11 182 lb 14.4 oz (82.963 kg)  05/26/11 184 lb 1.6 oz (83.507 kg)   Lab Results  Component Value Date   WBC 6.9 05/26/2011   HGB 10.4* 05/26/2011   HCT 31.7* 05/26/2011   MCV 82.6 05/26/2011   PLT 299 05/26/2011   Lab Results  Component Value Date   CREATININE 0.77 05/26/2011   BUN 12 05/26/2011   NA 140 05/26/2011   K 4.6 05/26/2011   CL 102 05/26/2011   CO2 25 05/26/2011     Impression:  Finishes RT today.   Plan:  Continue radiaplex. Add to other areas.  Call Dr. Craige Cotta if breathing worsens. F/u in 1 month. Preferably in late morning, early afternoon.

## 2011-06-08 NOTE — Progress Notes (Signed)
Encounter addended by: Lurline Hare, MD on: 06/08/2011  3:44 PM<BR>     Documentation filed: Notes Section

## 2011-06-08 NOTE — Progress Notes (Signed)
Pt ambulated steady gait completed rad tx today, room air sats=91=93% room air, after 4 minutes back on oxygen up to 96% 2 liters n/c, oxygen, gave 1 month f/u appt card, b/p=144/73, p=107,rr=24, pt taking decadron prior to chemotherapy tomorrow, red pea sized erythema spot on middle sternum, under right side rib area nickel sized erythema spot, pt is using radiaplex gel upper back, dry, Pt also using Carafate given rx by Dr. Mitzi Hansen last week, , left chest and back pain level about  2, position are at night uncomfortable, itching burn on uppe r back as well, 2:53 PM

## 2011-06-09 ENCOUNTER — Ambulatory Visit: Payer: Medicare Other | Admitting: Nutrition

## 2011-06-09 ENCOUNTER — Ambulatory Visit (HOSPITAL_BASED_OUTPATIENT_CLINIC_OR_DEPARTMENT_OTHER): Payer: Medicare Other

## 2011-06-09 ENCOUNTER — Other Ambulatory Visit (HOSPITAL_BASED_OUTPATIENT_CLINIC_OR_DEPARTMENT_OTHER): Payer: Medicare Other | Admitting: Lab

## 2011-06-09 ENCOUNTER — Other Ambulatory Visit: Payer: Self-pay | Admitting: Internal Medicine

## 2011-06-09 VITALS — BP 154/65 | HR 94 | Temp 97.2°F

## 2011-06-09 DIAGNOSIS — Z5111 Encounter for antineoplastic chemotherapy: Secondary | ICD-10-CM | POA: Diagnosis not present

## 2011-06-09 DIAGNOSIS — C3490 Malignant neoplasm of unspecified part of unspecified bronchus or lung: Secondary | ICD-10-CM

## 2011-06-09 LAB — CBC WITH DIFFERENTIAL/PLATELET
Eosinophils Absolute: 0 10*3/uL (ref 0.0–0.5)
HCT: 31.4 % — ABNORMAL LOW (ref 34.8–46.6)
LYMPH%: 4.2 % — ABNORMAL LOW (ref 14.0–49.7)
MONO#: 0.5 10*3/uL (ref 0.1–0.9)
NEUT#: 7.2 10*3/uL — ABNORMAL HIGH (ref 1.5–6.5)
Platelets: 317 10*3/uL (ref 145–400)
RBC: 3.73 10*6/uL (ref 3.70–5.45)
WBC: 8 10*3/uL (ref 3.9–10.3)
lymph#: 0.3 10*3/uL — ABNORMAL LOW (ref 0.9–3.3)

## 2011-06-09 LAB — COMPREHENSIVE METABOLIC PANEL
ALT: 13 U/L (ref 0–35)
Albumin: 4.7 g/dL (ref 3.5–5.2)
CO2: 24 mEq/L (ref 19–32)
Calcium: 10.2 mg/dL (ref 8.4–10.5)
Chloride: 99 mEq/L (ref 96–112)
Glucose, Bld: 147 mg/dL — ABNORMAL HIGH (ref 70–99)
Sodium: 138 mEq/L (ref 135–145)
Total Bilirubin: 0.3 mg/dL (ref 0.3–1.2)
Total Protein: 7 g/dL (ref 6.0–8.3)

## 2011-06-09 MED ORDER — SODIUM CHLORIDE 0.9 % IV SOLN
Freq: Once | INTRAVENOUS | Status: AC
  Administered 2011-06-09: 09:00:00 via INTRAVENOUS

## 2011-06-09 MED ORDER — SODIUM CHLORIDE 0.9 % IV SOLN
592.5000 mg | Freq: Once | INTRAVENOUS | Status: AC
  Administered 2011-06-09: 590 mg via INTRAVENOUS
  Filled 2011-06-09: qty 59

## 2011-06-09 MED ORDER — DEXAMETHASONE SODIUM PHOSPHATE 4 MG/ML IJ SOLN
20.0000 mg | Freq: Once | INTRAMUSCULAR | Status: AC
  Administered 2011-06-09: 20 mg via INTRAVENOUS

## 2011-06-09 MED ORDER — ONDANSETRON 16 MG/50ML IVPB (CHCC)
16.0000 mg | Freq: Once | INTRAVENOUS | Status: AC
  Administered 2011-06-09: 16 mg via INTRAVENOUS

## 2011-06-09 MED ORDER — SODIUM CHLORIDE 0.9 % IV SOLN
500.0000 mg/m2 | Freq: Once | INTRAVENOUS | Status: AC
  Administered 2011-06-09: 975 mg via INTRAVENOUS
  Filled 2011-06-09: qty 39

## 2011-06-09 NOTE — Progress Notes (Signed)
I spoke briefly with [name] and her daughter today in the chemotherapy area.  [name]'s weight is stable at roughly 180-183 pounds.  She reports that her blood sugar first thing in the morning is pretty low, between 70 and 80.  It does go up in the evenings to around 160 range.  She is drinking Glucerna twice a day and she is requesting some additional samples today, along with some coupons.  NUTRITION DIAGNOSIS:  Food and nutrition related knowledge deficit has improved.  INTERVENTION:  I have reinforced the importance of small frequent meals and Glucerna as needed between meals to promote weight maintenance.  We have discussed the importance of protein foods.  The patient is appreciative of nutrition information and samples.  MONITORING/EVALUATION/GOALS:  The patient has tolerated high-protein foods frequently throughout the day and has been able to maintain her weight.  NEXT VISIT:  There is no followup scheduled.  However, the patient will contact me if she has questions or concerns or needs additional samples.    ______________________________ Zenovia Jarred, RD, LDN Clinical Nutrition Specialist BN/MEDQ  D:  06/09/2011  T:  06/09/2011  Job:  [id number]

## 2011-06-10 ENCOUNTER — Telehealth: Payer: Self-pay

## 2011-06-10 NOTE — Telephone Encounter (Signed)
Spoke with pt re: 1st carbo/alimta yesterday.  Pt denies nausea and vomiting. Verbalizes good po intake and appetite this am.  Denies insomnia. Reports constipation and has taken a stool softener.  Pt reports "this is not a new problem for me, so I know how to handle it."  Reports having metamucil and miralax in the home as well and verbalizes understanding on how to use each. Pt questions "is it normal to feel weak and wobbly?"  Pt denies dizziness, visual disturbance, or lightheadedness.  Instructed pt that fatigue is expected and encourage her to continue pushing po fluids, and take breaks/naps as her body requires. Pt is aware of how to contact Dr [facility] RN or on call MD as needed. dph

## 2011-06-16 ENCOUNTER — Ambulatory Visit: Payer: Medicare Other | Admitting: Physician Assistant

## 2011-06-16 ENCOUNTER — Ambulatory Visit: Payer: Medicare Other | Admitting: Internal Medicine

## 2011-06-16 ENCOUNTER — Other Ambulatory Visit: Payer: Medicare Other | Admitting: Lab

## 2011-06-16 ENCOUNTER — Telehealth: Payer: Self-pay | Admitting: Pulmonary Disease

## 2011-06-16 ENCOUNTER — Telehealth: Payer: Self-pay | Admitting: Internal Medicine

## 2011-06-16 NOTE — Telephone Encounter (Signed)
Called spoke with patient who c/o low grade fever up to 99.5, body aches, sore throat, weakness, increased SOB, wheezing, cough occasionally producing clear/white/"tint of green" mucus and thrush symptoms including rawness, redness and blisters in mouth x5days.  Currently taking advair 250-47mcg and stated that she brushes/rinses/gargles after each use.  Pt is also currently taking chemo for lung ca.  Stated zpak does not work.  VS not in office, will forward to doc of the day.  Dr Shelle Iron please advise, thanks.  [facility].   Allergies  Allergen Reactions  . Iohexol Shortness Of Breath and Other (See Comments)    Burning of skin  . Codeine     REACTION: GI upset  . Penicillins     REACTION: itching

## 2011-06-16 NOTE — Telephone Encounter (Signed)
pt called and needs to r.s from 3/6 to 3/8 due to weakness and her aid not being there  aom

## 2011-06-16 NOTE — Telephone Encounter (Signed)
I think this needs to be handled by her oncologist since she is currently getting chemo.  I am not sure any of these symptoms have anything to do with her lungs. I would like you to forward the message to Dr. Craige Cotta though, so he can consider what to do with the advair.

## 2011-06-17 ENCOUNTER — Telehealth: Payer: Self-pay | Admitting: *Deleted

## 2011-06-17 MED ORDER — HYDROCODONE-ACETAMINOPHEN 7.5-325 MG PO TABS: 1.0000 | ORAL_TABLET | Freq: Four times a day (QID) | ORAL | Status: AC | PRN

## 2011-06-17 MED ORDER — DOXYCYCLINE HYCLATE 100 MG PO TABS: 100.0000 mg | ORAL_TABLET | Freq: Two times a day (BID) | ORAL | Status: AC

## 2011-06-17 NOTE — Telephone Encounter (Signed)
Medications called to pharmacy. 

## 2011-06-17 NOTE — Telephone Encounter (Signed)
Pt called stating that she has a burn on her breast from radiation and she also needs her hydrocodone filled because her PCP office closed and they were filling it for chronic pain issues in her back.  Informed pt that she needs to contact rad-onc regarding her radiation burn and Dr Donnald Garre is out of the office but at this time we will not be filling her hydrocodone if her pain is not r/t her cancer.  Pt verbalized understanding.  Received request from [facility] regarding rx refill request.  [facility] and informed them we would not fill rx at this time.  Also called pt's former PCP Dr. Jacalyn Lefevre [phone] and they stated that Dr Hyacinth Meeker no longer works at [employer] and the [place] office did close.  They also stated that they have been informing pt that she needs a new PCP to get her pain meds but she has not found one yet.  Informed Tiana Loft PA-C and will route message to Dr Donnald Garre to review.  Pt is to see Tiana Loft 06/18/11.  SLJ

## 2011-06-17 NOTE — Telephone Encounter (Signed)
She has sinus congestion with cough and sputum.  She feels like she has a low grade temperature.  She has some wheezing.  She has been feeling week since she started chemotherapy, and has burning on her back and rash from radiation therapy.  She spoke with oncology and radiation oncology.  Please call script for doxycycline 100 mg bid, dispense 20 with no refills.  Pt also requesting refill on norco.  She has not been able to see her new pain specialist yet.  Please call in script for norco 7.5 mg/325 mg, one pill q6hrs prn, dispense 60 with no refills.  I have advised her that I will not refill this medication in the future, and she needs to establish with primary care and pain management to determine optimal therapy for her pain control.

## 2011-06-17 NOTE — Telephone Encounter (Signed)
Called spoke with patient, advised of KC's recs.  Pt verbalized her understanding but stated that this is an ongoing issue for her at this time of year.  VS in office this afternoon, will forward.  Dr Craige Cotta please advise, thanks.

## 2011-06-18 ENCOUNTER — Other Ambulatory Visit (HOSPITAL_BASED_OUTPATIENT_CLINIC_OR_DEPARTMENT_OTHER): Payer: Medicare Other

## 2011-06-18 ENCOUNTER — Ambulatory Visit (HOSPITAL_BASED_OUTPATIENT_CLINIC_OR_DEPARTMENT_OTHER): Payer: Medicare Other | Admitting: Physician Assistant

## 2011-06-18 ENCOUNTER — Encounter: Payer: Self-pay | Admitting: Physician Assistant

## 2011-06-18 VITALS — BP 135/86 | HR 110 | Temp 97.8°F | Ht 66.0 in | Wt 180.0 lb

## 2011-06-18 DIAGNOSIS — C3410 Malignant neoplasm of upper lobe, unspecified bronchus or lung: Secondary | ICD-10-CM

## 2011-06-18 DIAGNOSIS — R300 Dysuria: Secondary | ICD-10-CM

## 2011-06-18 DIAGNOSIS — C3490 Malignant neoplasm of unspecified part of unspecified bronchus or lung: Secondary | ICD-10-CM

## 2011-06-18 LAB — URINALYSIS, MICROSCOPIC - CHCC
Bilirubin (Urine): NEGATIVE
Glucose: NEGATIVE g/dL
Nitrite: NEGATIVE
pH: 6 (ref 4.6–8.0)

## 2011-06-18 LAB — COMPREHENSIVE METABOLIC PANEL
ALT: 48 U/L — ABNORMAL HIGH (ref 0–35)
Albumin: 4.3 g/dL (ref 3.5–5.2)
CO2: 26 mEq/L (ref 19–32)
Chloride: 99 mEq/L (ref 96–112)
Glucose, Bld: 109 mg/dL — ABNORMAL HIGH (ref 70–99)
Potassium: 3.5 mEq/L (ref 3.5–5.3)
Sodium: 137 mEq/L (ref 135–145)
Total Bilirubin: 0.3 mg/dL (ref 0.3–1.2)
Total Protein: 6.3 g/dL (ref 6.0–8.3)

## 2011-06-18 LAB — CBC WITH DIFFERENTIAL/PLATELET
BASO%: 0.2 % (ref 0.0–2.0)
Eosinophils Absolute: 0 10*3/uL (ref 0.0–0.5)
MCHC: 33.4 g/dL (ref 31.5–36.0)
MONO#: 0.1 10*3/uL (ref 0.1–0.9)
NEUT#: 1.9 10*3/uL (ref 1.5–6.5)
Platelets: 147 10*3/uL (ref 145–400)
RBC: 3.67 10*6/uL — ABNORMAL LOW (ref 3.70–5.45)
RDW: 15.4 % — ABNORMAL HIGH (ref 11.2–14.5)
WBC: 2.4 10*3/uL — ABNORMAL LOW (ref 3.9–10.3)
lymph#: 0.3 10*3/uL — ABNORMAL LOW (ref 0.9–3.3)

## 2011-06-21 ENCOUNTER — Telehealth: Payer: Self-pay | Admitting: Internal Medicine

## 2011-06-21 NOTE — Telephone Encounter (Signed)
called pt and asked that she picks up her march-may scheduled on 03/13

## 2011-06-22 ENCOUNTER — Other Ambulatory Visit: Payer: Medicare Other | Admitting: Lab

## 2011-06-23 ENCOUNTER — Telehealth: Payer: Self-pay | Admitting: Medical Oncology

## 2011-06-23 ENCOUNTER — Telehealth: Payer: Self-pay | Admitting: Internal Medicine

## 2011-06-23 ENCOUNTER — Other Ambulatory Visit (HOSPITAL_BASED_OUTPATIENT_CLINIC_OR_DEPARTMENT_OTHER): Payer: Medicare Other | Admitting: Lab

## 2011-06-23 DIAGNOSIS — C3490 Malignant neoplasm of unspecified part of unspecified bronchus or lung: Secondary | ICD-10-CM | POA: Diagnosis not present

## 2011-06-23 LAB — COMPREHENSIVE METABOLIC PANEL
AST: 35 U/L (ref 0–37)
BUN: 10 mg/dL (ref 6–23)
Calcium: 9.3 mg/dL (ref 8.4–10.5)
Chloride: 100 mEq/L (ref 96–112)
Creatinine, Ser: 0.79 mg/dL (ref 0.50–1.10)
Total Bilirubin: 0.3 mg/dL (ref 0.3–1.2)

## 2011-06-23 LAB — CBC WITH DIFFERENTIAL/PLATELET
Eosinophils Absolute: 0.1 10*3/uL (ref 0.0–0.5)
HCT: 28.1 % — ABNORMAL LOW (ref 34.8–46.6)
LYMPH%: 24.7 % (ref 14.0–49.7)
MCV: 83.7 fL (ref 79.5–101.0)
MONO#: 0.3 10*3/uL (ref 0.1–0.9)
MONO%: 20.4 % — ABNORMAL HIGH (ref 0.0–14.0)
NEUT#: 0.7 10*3/uL — ABNORMAL LOW (ref 1.5–6.5)
NEUT%: 50.8 % (ref 38.4–76.8)
Platelets: 70 10*3/uL — ABNORMAL LOW (ref 145–400)
RBC: 3.36 10*6/uL — ABNORMAL LOW (ref 3.70–5.45)
WBC: 1.4 10*3/uL — ABNORMAL LOW (ref 3.9–10.3)

## 2011-06-23 MED ORDER — LEVOFLOXACIN 500 MG PO TABS: 500.0000 mg | ORAL_TABLET | Freq: Every day | ORAL | Status: AC

## 2011-06-23 NOTE — Telephone Encounter (Signed)
Called pt with lab results and reviewed neutrapenic precautions - She stated she started Levaquin today for sinusitis -Dr Garner Gavel?

## 2011-06-23 NOTE — Telephone Encounter (Signed)
Please send script for levaquin 500 mg one pill daily for 7 days with no refills.

## 2011-06-23 NOTE — Progress Notes (Signed)
Quick Note:  Call patient with the result and give neutropenic precautions. ______ 

## 2011-06-23 NOTE — Progress Notes (Signed)
[facility] OFFICE PROGRESS NOTE  DIAGNOSIS: Stage IIB/IIIA non-small cell lung cancer consistent with adenocarcinoma diagnosed in December 2012.  PRIOR THERAPY: status post radiation therapy to the right upper lobe lung mass completed on 06/04/2011 under the care of Dr. Michell Heinrich.  CURRENT THERAPY:  1. systemic chemotherapy with carboplatin for AUC of 5 and Alimta 500 mg/M2 giving every 3 weeks, status post 1 cycle  INTERVAL HISTORY: [name] 66 y.o. female returns to the clinic today for followup visit accompanied her daughter. She complains of feeling weak. She has noticed a decreased appetite. She also complains of her throat and chest being sore. She complains of an area on her chest in the area where she received radiotherapy that is not healing. The creams that she is using are not helping. The patient is tolerated her radiotherapy fairly well except for erythema and itching on the skin. . She denied having any significant chest pain or shortness of breath today. She has no significant weight loss or night sweats.  MEDICAL HISTORY: Past Medical History  Diagnosis Date  . Polyneuropathy in diabetes   . Restless legs syndrome (RLS)   . Chronic sinusitis   . Hypertension   . Hyperlipidemia   . Type II or unspecified type diabetes mellitus without mention of complication, not stated as uncontrolled   . Anemia, unspecified   . GERD (gastroesophageal reflux disease)   . Respiratory failure with hypoxia 01/02/2010  . Complication of anesthesia     slow to wake up  . Adenocarcinoma, lung     upper left lobe  . COPD (chronic obstructive pulmonary disease)   . Emphysema   . Asthma   . Coronary artery disease     Dr. Excell Seltzer had stress test done  . Dysrhythmia     unsure what type  . Peripheral vascular disease   . Non-small cell lung cancer 03/31/2011    Adenocarcinoma Rt upper lobe mass    ALLERGIES:  is allergic to iohexol; codeine; and  penicillins.  MEDICATIONS:  Current Outpatient Prescriptions  Medication Sig Dispense Refill  . albuterol (PROAIR HFA) 108 (90 BASE) MCG/ACT inhaler Inhale 2 puffs into the lungs every 6 (six) hours as needed. For shortness of breath      . albuterol-ipratropium (COMBIVENT) 18-103 MCG/ACT inhaler Inhale 2 puffs into the lungs every 6 (six) hours as needed. For shortness of breath      . ALPRAZolam (XANAX) 1 MG tablet Take 1 mg by mouth 3 (three) times daily as needed. For anxiety      . aspirin EC 325 MG tablet Take 325 mg by mouth daily.        . calcium carbonate 200 MG capsule Take 1,000 mg by mouth daily. Does not take everyday       . cholecalciferol (VITAMIN D) 1000 UNITS tablet Take 1,000 Units by mouth daily. Dose varies and pt does not take every day       . citalopram (CELEXA) 40 MG tablet Take 1 tablet by mouth at bedtime.      Marland Kitchen diltiazem (CARDIZEM CD) 240 MG 24 hr capsule Take 1 capsule (240 mg total) by mouth daily.  30 capsule  3  . doxycycline (VIBRA-TABS) 100 MG tablet Take 1 tablet (100 mg total) by mouth 2 (two) times daily.  20 tablet  0  . Fluticasone-Salmeterol (ADVAIR) 250-50 MCG/DOSE AEPB Inhale 1 puff into the lungs every 12 (twelve) hours.      Marland Kitchen  folic acid (FOLVITE) 1 MG tablet Take 1 mg by mouth daily.      Marland Kitchen gemfibrozil (LOPID) 600 MG tablet Take 600 mg by mouth 2 (two) times daily before a meal.        . guaiFENesin (MUCINEX) 600 MG 12 hr tablet Take 600 mg by mouth daily.      Marland Kitchen HYDROcodone-acetaminophen (NORCO) 7.5-325 MG per tablet Take 1 tablet by mouth every 6 (six) hours as needed for pain.  60 tablet  0  . HYDROcodone-acetaminophen (VICODIN ES) 7.5-750 MG per tablet Take 1 tablet by mouth every 6 (six) hours as needed. For severe pain      . levofloxacin (LEVAQUIN) 500 MG tablet Take 1 tablet (500 mg total) by mouth daily.  7 tablet  0  . losartan (COZAAR) 50 MG tablet Take 50 mg by mouth 2 (two) times daily.        . metFORMIN (GLUCOPHAGE) 500 MG tablet  Take 500 mg by mouth 2 (two) times daily with a meal.       . NON FORMULARY Oxygen 2L prn      . omeprazole (PRILOSEC) 20 MG capsule Take 20 mg by mouth daily.       . polyethylene glycol (MIRALAX / GLYCOLAX) packet Take 17 g by mouth daily as needed.      . potassium chloride SA (K-DUR,KLOR-CON) 20 MEQ tablet Take 1 tablet (20 mEq total) by mouth 2 (two) times daily.  60 tablet  6  . prochlorperazine (COMPAZINE) 10 MG tablet Take 10 mg by mouth every 6 (six) hours as needed.      . sucralfate (CARAFATE) 1 G tablet Take 1 tablet (1 g total) by mouth 4 (four) times daily. Dissolve in 15 ml water.  90 tablet  1  . traMADol (ULTRAM) 50 MG tablet Take 50 mg by mouth every 4 (four) hours as needed. For moderate pain      . triamterene-hydrochlorothiazide (MAXZIDE-25) 37.5-25 MG per tablet Take 1 tablet by mouth daily.       No current facility-administered medications for this visit.   Facility-Administered Medications Ordered in Other Visits  Medication Dose Route Frequency Provider Last Rate Last Dose  . hyaluronate sodium (RADIAPLEXRX) gel   Topical Once Lurline Hare, MD      . topical emolient (BIAFINE) emulsion   Topical PRN Lurline Hare, MD        SURGICAL HISTORY:  Past Surgical History  Procedure Date  . Lung lobectomy     left  . Septoplasty   . Total hip arthroplasty 01/2010    left  . Bronchoscopy 02/2011    REVIEW OF SYSTEMS:  A comprehensive review of systems was negative except for: Constitutional: positive for anorexia Integument/breast: positive for skin lesion(s) sore throat and chest soreness   PHYSICAL EXAMINATION: General appearance: alert, cooperative and no distress Neck: no adenopathy Lymph nodes: Cervical, supraclavicular, and axillary nodes normal. Resp: clear to auscultation bilaterally Cardio: regular rate and rhythm, S1, S2 normal, no murmur, click, rub or gallop GI: soft, non-tender; bowel sounds normal; no masses,  no organomegaly Extremities:  extremities normal, atraumatic, no cyanosis or edema Neurologic: Alert and oriented X 3, normal strength and tone. Normal symmetric reflexes. Normal coordination and gait Skin: Approximately 1.25" circular lesion on right lateral chest wall with dry, flaking skin. No drainage  ECOG PERFORMANCE STATUS: 1 - Symptomatic but completely ambulatory  Blood pressure 135/86, pulse 110, temperature 97.8 F (36.6 C), temperature source Oral, height 5'  6" (1.676 m), weight 180 lb (81.647 kg).  LABORATORY DATA: Lab Results  Component Value Date   WBC 1.4* 06/23/2011   HGB 9.4* 06/23/2011   HCT 28.1* 06/23/2011   MCV 83.7 06/23/2011   PLT 70* 06/23/2011      Chemistry      Component Value Date/Time   NA 138 06/23/2011 1303   K 3.6 06/23/2011 1303   CL 100 06/23/2011 1303   CO2 25 06/23/2011 1303   BUN 10 06/23/2011 1303   CREATININE 0.79 06/23/2011 1303      Component Value Date/Time   CALCIUM 9.3 06/23/2011 1303   ALKPHOS 109 06/23/2011 1303   AST 35 06/23/2011 1303   ALT 41* 06/23/2011 1303   BILITOT 0.3 06/23/2011 1303       RADIOGRAPHIC STUDIES: No results found.  ASSESSMENT/PLAN: This is a very pleasant 66 years old white female with a stage IIb/IIIa non-small cell lung cancer consistent with adenocarcinoma. She is status post curative radiotherapy to the right upper lobe lung nodule. She is currently status post 1 cycle ( of 4 planned cycles) of systemic chemotherapy with carboplatin for AUC of 5 and Alimta 500 mg/m2 given every 3 weeks. The patient was discussed with Dr. Darrold Span in Dr. Asa Lente absence. We will continue to check weekly labs consisting of a CBC differential and CMET. She is given samples of Aquaphor lotion to apply to the lesion on her right lateral chest wall. She will return in 2 weeks prior to cycle #2 of her systemic chemotherapy with carboplatin and Alimta. She complained of some dysuria and we will check a clean catch urinalysis and urine C&S to further evaluate this  complaint.   Laural Benes, Dezaree Tracey E, PA-C     All questions were answered. The patient knows to call the clinic with any problems, questions or concerns. We can certainly see the patient much sooner if necessary.

## 2011-06-23 NOTE — Telephone Encounter (Signed)
Pt states her insurance will not cover doxycycline. She is requesting an alternative be called to her pharmacy but does not want a Zpak due to side effects. Pt still c/o sob, wheezing, cough with greenish mucus and body aches. Pls advise on alternative. Allergies  Allergen Reactions  . Iohexol Shortness Of Breath and Other (See Comments)    Burning of skin  . Codeine     REACTION: GI upset  . Penicillins     REACTION: itching

## 2011-06-23 NOTE — Telephone Encounter (Signed)
I spoke with pt and agree'd with VS recs. She is aware rx has been sent to the pharmacy and needed nothing further

## 2011-06-23 NOTE — Telephone Encounter (Signed)
Message copied by Charma Igo on Wed Jun 23, 2011  6:03 PM ------      Message from: [name]      Created: Wed Jun 23, 2011  4:24 PM       Call patient with the result and give neutropenic precautions.

## 2011-06-25 ENCOUNTER — Telehealth: Payer: Self-pay | Admitting: Nutrition

## 2011-06-25 NOTE — Telephone Encounter (Signed)
Patient wanted to know what she could eat to build her counts up.  She reports she continues to drink Glucerna once daily.  She wondered if there was a more nutritious supplement she could drink to improve her counts.  I educated patient on a healthy balanced diet with increased protein,  B-vitamin rich foods and iron rich foods.  I encouraged her to continue Glucerna as needed.  Patient appreciative of information.

## 2011-06-29 DIAGNOSIS — Z79899 Other long term (current) drug therapy: Secondary | ICD-10-CM | POA: Diagnosis not present

## 2011-06-29 DIAGNOSIS — B0223 Postherpetic polyneuropathy: Secondary | ICD-10-CM | POA: Diagnosis not present

## 2011-06-29 DIAGNOSIS — M47817 Spondylosis without myelopathy or radiculopathy, lumbosacral region: Secondary | ICD-10-CM | POA: Diagnosis not present

## 2011-06-29 DIAGNOSIS — R079 Chest pain, unspecified: Secondary | ICD-10-CM | POA: Diagnosis not present

## 2011-06-30 ENCOUNTER — Ambulatory Visit (HOSPITAL_BASED_OUTPATIENT_CLINIC_OR_DEPARTMENT_OTHER): Payer: Medicare Other | Admitting: Physician Assistant

## 2011-06-30 ENCOUNTER — Encounter: Payer: Self-pay | Admitting: Physician Assistant

## 2011-06-30 ENCOUNTER — Ambulatory Visit (HOSPITAL_BASED_OUTPATIENT_CLINIC_OR_DEPARTMENT_OTHER): Payer: Medicare Other

## 2011-06-30 ENCOUNTER — Telehealth: Payer: Self-pay | Admitting: Medical Oncology

## 2011-06-30 ENCOUNTER — Other Ambulatory Visit (HOSPITAL_BASED_OUTPATIENT_CLINIC_OR_DEPARTMENT_OTHER): Payer: Medicare Other | Admitting: Lab

## 2011-06-30 VITALS — BP 111/58 | HR 91 | Temp 98.3°F | Ht 66.0 in | Wt 180.6 lb

## 2011-06-30 DIAGNOSIS — D649 Anemia, unspecified: Secondary | ICD-10-CM | POA: Diagnosis not present

## 2011-06-30 DIAGNOSIS — C3410 Malignant neoplasm of upper lobe, unspecified bronchus or lung: Secondary | ICD-10-CM

## 2011-06-30 DIAGNOSIS — C3490 Malignant neoplasm of unspecified part of unspecified bronchus or lung: Secondary | ICD-10-CM

## 2011-06-30 DIAGNOSIS — Z5111 Encounter for antineoplastic chemotherapy: Secondary | ICD-10-CM | POA: Diagnosis not present

## 2011-06-30 DIAGNOSIS — J449 Chronic obstructive pulmonary disease, unspecified: Secondary | ICD-10-CM

## 2011-06-30 DIAGNOSIS — E119 Type 2 diabetes mellitus without complications: Secondary | ICD-10-CM

## 2011-06-30 LAB — CBC WITH DIFFERENTIAL/PLATELET
Basophils Absolute: 0 10*3/uL (ref 0.0–0.1)
Eosinophils Absolute: 0 10*3/uL (ref 0.0–0.5)
HCT: 27.4 % — ABNORMAL LOW (ref 34.8–46.6)
HGB: 8.9 g/dL — ABNORMAL LOW (ref 11.6–15.9)
MCV: 84 fL (ref 79.5–101.0)
MONO%: 11.9 % (ref 0.0–14.0)
NEUT#: 7.8 10*3/uL — ABNORMAL HIGH (ref 1.5–6.5)
RDW: 15 % — ABNORMAL HIGH (ref 11.2–14.5)
lymph#: 0.4 10*3/uL — ABNORMAL LOW (ref 0.9–3.3)

## 2011-06-30 LAB — COMPREHENSIVE METABOLIC PANEL
Albumin: 4 g/dL (ref 3.5–5.2)
CO2: 25 mEq/L (ref 19–32)
Glucose, Bld: 140 mg/dL — ABNORMAL HIGH (ref 70–99)
Potassium: 4 mEq/L (ref 3.5–5.3)
Sodium: 138 mEq/L (ref 135–145)
Total Protein: 6.7 g/dL (ref 6.0–8.3)

## 2011-06-30 MED ORDER — DEXAMETHASONE SODIUM PHOSPHATE 4 MG/ML IJ SOLN
20.0000 mg | Freq: Once | INTRAMUSCULAR | Status: AC
  Administered 2011-06-30: 20 mg via INTRAVENOUS

## 2011-06-30 MED ORDER — ONDANSETRON 16 MG/50ML IVPB (CHCC)
16.0000 mg | Freq: Once | INTRAVENOUS | Status: AC
  Administered 2011-06-30: 16 mg via INTRAVENOUS

## 2011-06-30 MED ORDER — SODIUM CHLORIDE 0.9 % IV SOLN
500.0000 mg/m2 | Freq: Once | INTRAVENOUS | Status: AC
  Administered 2011-06-30: 975 mg via INTRAVENOUS
  Filled 2011-06-30: qty 39

## 2011-06-30 MED ORDER — SODIUM CHLORIDE 0.9 % IV SOLN
580.0000 mg | Freq: Once | INTRAVENOUS | Status: AC
  Administered 2011-06-30: 580 mg via INTRAVENOUS
  Filled 2011-06-30: qty 58

## 2011-06-30 NOTE — Patient Instructions (Signed)
[facility] Discharge Instructions for Patients Receiving Chemotherapy  Today you received the following chemotherapy agents Alimta and Carboplatin.  To help prevent nausea and vomiting after your treatment, we encourage you to take your nausea medication as prescribed by your physician.   If you develop nausea and vomiting that is not controlled by your nausea medication, call the clinic.   BELOW ARE SYMPTOMS THAT SHOULD BE REPORTED IMMEDIATELY:  *FEVER GREATER THAN 100.5 F  *CHILLS WITH OR WITHOUT FEVER  NAUSEA AND VOMITING THAT IS NOT CONTROLLED WITH YOUR NAUSEA MEDICATION  *UNUSUAL SHORTNESS OF BREATH  *UNUSUAL BRUISING OR BLEEDING  TENDERNESS IN MOUTH AND THROAT WITH OR WITHOUT PRESENCE OF ULCERS  *URINARY PROBLEMS  *BOWEL PROBLEMS  UNUSUAL RASH Items with * indicate a potential emergency and should be followed up as soon as possible.  Feel free to call the clinic you have any questions or concerns. The clinic phone number is [phone].    

## 2011-06-30 NOTE — Telephone Encounter (Signed)
Called to confirm appointments and if she should take premeds. Information given

## 2011-07-01 ENCOUNTER — Telehealth: Payer: Self-pay | Admitting: Pulmonary Disease

## 2011-07-01 NOTE — Telephone Encounter (Signed)
Ok with me to switch 

## 2011-07-01 NOTE — Telephone Encounter (Signed)
RB, please advise. Thanks!  

## 2011-07-01 NOTE — Telephone Encounter (Signed)
Called and spoke with pt.  Pt is requesting to switch from Dr. Craige Cotta to Dr. Delton Coombes.  She states Dr. Craige Cotta is a "great guy and has no hard feelings but would just like to switch."  Pt is aware that we will have to get approval from both physicians before the switch can be approved. RB and VS, please advise.  Thanks!

## 2011-07-01 NOTE — Progress Notes (Signed)
[facility] Radiation Oncology Simulation Verification Note   Name: [name] MRN: [id number]   Date: 04/27/2011  DOB: 10/20/1945  Status:outpatient   DIAGNOSIS:  1. T1N0 AdenoCA of RUL     POSITION: Patient was placed in the supine position on the treatment machine.  Isocenter and MLCS were reviewed and treatment was approved. I reviewed the overlay of the mlcs on the kv images. I reviewed the cone beam CT and the normal structures as well as the tumor volume.   NARRATIVE: Patient tolerated simulation well.

## 2011-07-01 NOTE — Progress Notes (Signed)
DIAGNOSIS:  Non-small cell lung cancer.  T1 N0 adenocarcinoma of the right upper lobe.  TREATMENT DATES:  04/28/2011 to 06/08/2011.  ANATOMIC REGION TREATED:  Right upper lobe.  DOSE: 1. 5.13 Gy at 2.7 Gy per fraction x19 fractions. 2. 18.9 Gy at 2.7 Gy x7 fractions.  BEAM ARRANGEMENT: 1. DCA. 2. DCA.  BEAM ENERGY: 1. 6 mV photons. 2. 6 mV photons.  TREATMENT TOLERANCE:  Ms. Billingham initially tolerated her treatment well. She began experiencing some dry desquamation on her back.  For that reason, we re-simulated her and she received a boost for her final 7 fractions.  FOLLOWUP:  I will plan on seeing her back in 1 month's time.  She also has regular scheduled followup with Dr. Craige Cotta.    ______________________________ Lurline Hare, M.D. SW/MEDQ  D:  07/01/2011  T:  07/01/2011  Job:  [id number]

## 2011-07-02 ENCOUNTER — Telehealth: Payer: Self-pay | Admitting: Internal Medicine

## 2011-07-02 NOTE — Telephone Encounter (Signed)
This is OK with me too.

## 2011-07-02 NOTE — Telephone Encounter (Signed)
Added f/u appt for 4/10. S/w pt re new appt time for 4/10 @ 10:45 am.

## 2011-07-02 NOTE — Telephone Encounter (Signed)
Pt aware and has scheduled an appt on Fri., 4/26 @ 11am. Made appt 30 min since she will be new to RB and is not a new consult. RB aware and is fine with this.

## 2011-07-03 NOTE — Progress Notes (Signed)
[facility] OFFICE PROGRESS NOTE  DIAGNOSIS: Stage IIB/IIIA non-small cell lung cancer consistent with adenocarcinoma diagnosed in December 2012.  PRIOR THERAPY: status post radiation therapy to the right upper lobe lung mass completed on 06/04/2011 under the care of Dr. Michell Heinrich.  CURRENT THERAPY:  1. systemic chemotherapy with carboplatin for AUC of 5 and Alimta 500 mg/M2 giving every 3 weeks, status post 1 cycle  INTERVAL HISTORY: [name] 66 y.o. female returns to the clinic today for followup visit unaccompanied. She reports she had continued weakness and dizziness for about a week after her last office visit. She reports the symptoms gradually got better. Her appetite is improved. She completed a course of Levaquin that was prescribed by Dr. Craige Cotta. She does report some intermittent pains across her chest more so on the right side "wear my malignancy is" as well as some intermittent right arm numbness. She reports that the Aquaphor was helpful to the area on her right lateral chest She denied having any significant chest pain or shortness of breath today. She has no significant weight loss or night sweats.  MEDICAL HISTORY: Past Medical History  Diagnosis Date  . Polyneuropathy in diabetes   . Restless legs syndrome (RLS)   . Chronic sinusitis   . Hypertension   . Hyperlipidemia   . Type II or unspecified type diabetes mellitus without mention of complication, not stated as uncontrolled   . Anemia, unspecified   . GERD (gastroesophageal reflux disease)   . Respiratory failure with hypoxia 01/02/2010  . Complication of anesthesia     slow to wake up  . Adenocarcinoma, lung     upper left lobe  . COPD (chronic obstructive pulmonary disease)   . Emphysema   . Asthma   . Coronary artery disease     Dr. Excell Seltzer had stress test done  . Dysrhythmia     unsure what type  . Peripheral vascular disease   . Non-small cell lung cancer 03/31/2011   Adenocarcinoma Rt upper lobe mass    ALLERGIES:  is allergic to iohexol; codeine; and penicillins.  MEDICATIONS:  Current Outpatient Prescriptions  Medication Sig Dispense Refill  . albuterol (PROAIR HFA) 108 (90 BASE) MCG/ACT inhaler Inhale 2 puffs into the lungs every 6 (six) hours as needed. For shortness of breath      . albuterol-ipratropium (COMBIVENT) 18-103 MCG/ACT inhaler Inhale 2 puffs into the lungs every 6 (six) hours as needed. For shortness of breath      . ALPRAZolam (XANAX) 1 MG tablet Take 1 mg by mouth 3 (three) times daily as needed. For anxiety      . aspirin EC 325 MG tablet Take 325 mg by mouth daily.        . calcium carbonate 200 MG capsule Take 1,000 mg by mouth daily. Does not take everyday       . cholecalciferol (VITAMIN D) 1000 UNITS tablet Take 1,000 Units by mouth daily. Dose varies and pt does not take every day       . citalopram (CELEXA) 40 MG tablet Take 1 tablet by mouth at bedtime.      Marland Kitchen dexamethasone (DECADRON) 4 MG tablet Take 4 mg by mouth 2 (two) times daily with a meal. Take 1 tablet twice a day the day before, the day of and the day after chemotherapy.       . diltiazem (CARDIZEM CD) 240 MG 24 hr capsule Take 1 capsule (240  mg total) by mouth daily.  30 capsule  3  . Fluticasone-Salmeterol (ADVAIR) 250-50 MCG/DOSE AEPB Inhale 1 puff into the lungs every 12 (twelve) hours.      . folic acid (FOLVITE) 1 MG tablet Take 1 mg by mouth daily.      Marland Kitchen gemfibrozil (LOPID) 600 MG tablet Take 600 mg by mouth 2 (two) times daily before a meal.        . guaiFENesin (MUCINEX) 600 MG 12 hr tablet Take 600 mg by mouth daily.      Marland Kitchen HYDROcodone-acetaminophen (VICODIN ES) 7.5-750 MG per tablet Take 1 tablet by mouth every 6 (six) hours as needed. For severe pain      . losartan (COZAAR) 50 MG tablet Take 50 mg by mouth 2 (two) times daily.        . metFORMIN (GLUCOPHAGE) 500 MG tablet Take 500 mg by mouth 2 (two) times daily with a meal.       . NON FORMULARY Oxygen  2L prn      . omeprazole (PRILOSEC) 20 MG capsule Take 20 mg by mouth daily.       . polyethylene glycol (MIRALAX / GLYCOLAX) packet Take 17 g by mouth daily as needed.      . potassium chloride SA (K-DUR,KLOR-CON) 20 MEQ tablet Take 1 tablet (20 mEq total) by mouth 2 (two) times daily.  60 tablet  6  . prochlorperazine (COMPAZINE) 10 MG tablet Take 10 mg by mouth every 6 (six) hours as needed.      . sucralfate (CARAFATE) 1 G tablet Take 1 tablet (1 g total) by mouth 4 (four) times daily. Dissolve in 15 ml water.  90 tablet  1  . traMADol (ULTRAM) 50 MG tablet Take 50 mg by mouth every 4 (four) hours as needed. For moderate pain      . triamterene-hydrochlorothiazide (MAXZIDE-25) 37.5-25 MG per tablet Take 1 tablet by mouth daily.      Marland Kitchen levofloxacin (LEVAQUIN) 500 MG tablet Take 1 tablet (500 mg total) by mouth daily.  7 tablet  0   No current facility-administered medications for this visit.   Facility-Administered Medications Ordered in Other Visits  Medication Dose Route Frequency Provider Last Rate Last Dose  . hyaluronate sodium (RADIAPLEXRX) gel   Topical Once Lurline Hare, MD      . topical emolient (BIAFINE) emulsion   Topical PRN Lurline Hare, MD        SURGICAL HISTORY:  Past Surgical History  Procedure Date  . Lung lobectomy     left  . Septoplasty   . Total hip arthroplasty 01/2010    left  . Bronchoscopy 02/2011    REVIEW OF SYSTEMS:  A comprehensive review of systems was negative except for: Constitutional: positive for fatigue and Weakness and dizziness Integument/breast: positive for skin lesion(s) chest soreness and intermittent right arm numbness   PHYSICAL EXAMINATION: General appearance: alert, cooperative and no distress Neck: no adenopathy Lymph nodes: Cervical, supraclavicular, and axillary nodes normal. Resp: clear to auscultation bilaterally Cardio: regular rate and rhythm, S1, S2 normal, no murmur, click, rub or gallop GI: soft, non-tender;  bowel sounds normal; no masses,  no organomegaly Extremities: extremities normal, atraumatic, no cyanosis or edema Neurologic: Alert and oriented X 3, normal strength and tone. Normal symmetric reflexes. Normal coordination and gait Skin: Approximately 1.25" circular lesion on right lateral chest wall now with more normal-looking skin in this area and significantly decreased dryness and flaking  ECOG PERFORMANCE STATUS: 1 -  Symptomatic but completely ambulatory  Blood pressure 111/58, pulse 91, temperature 98.3 F (36.8 C), temperature source Oral, height 5\' 6"  (1.676 m), weight 180 lb 9.6 oz (81.92 kg).  LABORATORY DATA: Lab Results  Component Value Date   WBC 9.4 06/30/2011   HGB 8.9* 06/30/2011   HCT 27.4* 06/30/2011   MCV 84.0 06/30/2011   PLT 363 06/30/2011      Chemistry      Component Value Date/Time   NA 138 06/30/2011 1422   K 4.0 06/30/2011 1422   CL 98 06/30/2011 1422   CO2 25 06/30/2011 1422   BUN 12 06/30/2011 1422   CREATININE 0.79 06/30/2011 1422      Component Value Date/Time   CALCIUM 10.0 06/30/2011 1422   ALKPHOS 102 06/30/2011 1422   AST 19 06/30/2011 1422   ALT 19 06/30/2011 1422   BILITOT 0.2* 06/30/2011 1422       RADIOGRAPHIC STUDIES: No results found.  ASSESSMENT/PLAN: This is a very pleasant 66 years old white female with a stage IIb/IIIa non-small cell lung cancer consistent with adenocarcinoma. She is status post curative radiotherapy to the right upper lobe lung nodule. She is currently status post 1 cycle ( of 4 planned cycles) of systemic chemotherapy with carboplatin for AUC of 5 and Alimta 500 mg/m2 given every 3 weeks. The patient was discussed  Dr. Arbutus Ped. She will proceed with cycle #2 of her systemic chemotherapy with carboplatin and Alimta We will continue to check weekly labs consisting of a CBC differential and CMET.  She will return in 3 weeks prior to cycle #3 of her systemic chemotherapy with carboplatin and Alimta.   Laural Benes, Adrean Heitz E,  PA-C     All questions were answered. The patient knows to call the clinic with any problems, questions or concerns. We can certainly see the patient much sooner if necessary.

## 2011-07-04 ENCOUNTER — Emergency Department (HOSPITAL_COMMUNITY): Payer: Medicare Other

## 2011-07-04 ENCOUNTER — Emergency Department (HOSPITAL_COMMUNITY)
Admission: EM | Admit: 2011-07-04 | Discharge: 2011-07-04 | Disposition: A | Discharge status: Home / Self Care | Payer: Medicare Other | Attending: Emergency Medicine | Admitting: Emergency Medicine

## 2011-07-04 ENCOUNTER — Other Ambulatory Visit: Payer: Self-pay

## 2011-07-04 ENCOUNTER — Encounter (HOSPITAL_COMMUNITY): Payer: Self-pay | Admitting: Emergency Medicine

## 2011-07-04 DIAGNOSIS — Z87891 Personal history of nicotine dependence: Secondary | ICD-10-CM | POA: Insufficient documentation

## 2011-07-04 DIAGNOSIS — Z7982 Long term (current) use of aspirin: Secondary | ICD-10-CM | POA: Diagnosis not present

## 2011-07-04 DIAGNOSIS — E119 Type 2 diabetes mellitus without complications: Secondary | ICD-10-CM | POA: Diagnosis not present

## 2011-07-04 DIAGNOSIS — Z79899 Other long term (current) drug therapy: Secondary | ICD-10-CM | POA: Insufficient documentation

## 2011-07-04 DIAGNOSIS — I10 Essential (primary) hypertension: Secondary | ICD-10-CM | POA: Diagnosis not present

## 2011-07-04 DIAGNOSIS — E860 Dehydration: Secondary | ICD-10-CM

## 2011-07-04 DIAGNOSIS — J329 Chronic sinusitis, unspecified: Secondary | ICD-10-CM | POA: Diagnosis not present

## 2011-07-04 DIAGNOSIS — E785 Hyperlipidemia, unspecified: Secondary | ICD-10-CM | POA: Insufficient documentation

## 2011-07-04 DIAGNOSIS — R05 Cough: Secondary | ICD-10-CM | POA: Diagnosis not present

## 2011-07-04 DIAGNOSIS — C3490 Malignant neoplasm of unspecified part of unspecified bronchus or lung: Secondary | ICD-10-CM | POA: Diagnosis not present

## 2011-07-04 DIAGNOSIS — Z902 Acquired absence of lung [part of]: Secondary | ICD-10-CM | POA: Diagnosis not present

## 2011-07-04 DIAGNOSIS — K219 Gastro-esophageal reflux disease without esophagitis: Secondary | ICD-10-CM | POA: Diagnosis not present

## 2011-07-04 DIAGNOSIS — R0602 Shortness of breath: Secondary | ICD-10-CM | POA: Diagnosis not present

## 2011-07-04 DIAGNOSIS — J438 Other emphysema: Secondary | ICD-10-CM | POA: Diagnosis not present

## 2011-07-04 DIAGNOSIS — I2510 Atherosclerotic heart disease of native coronary artery without angina pectoris: Secondary | ICD-10-CM | POA: Insufficient documentation

## 2011-07-04 DIAGNOSIS — R059 Cough, unspecified: Secondary | ICD-10-CM | POA: Diagnosis not present

## 2011-07-04 DIAGNOSIS — R55 Syncope and collapse: Secondary | ICD-10-CM | POA: Diagnosis not present

## 2011-07-04 DIAGNOSIS — E876 Hypokalemia: Secondary | ICD-10-CM

## 2011-07-04 DIAGNOSIS — Z85118 Personal history of other malignant neoplasm of bronchus and lung: Secondary | ICD-10-CM | POA: Diagnosis not present

## 2011-07-04 DIAGNOSIS — W19XXXA Unspecified fall, initial encounter: Secondary | ICD-10-CM | POA: Insufficient documentation

## 2011-07-04 LAB — COMPREHENSIVE METABOLIC PANEL
Albumin: 3.6 g/dL (ref 3.5–5.2)
BUN: 20 mg/dL (ref 6–23)
Calcium: 8.5 mg/dL (ref 8.4–10.5)
Creatinine, Ser: 0.97 mg/dL (ref 0.50–1.10)
GFR calc Af Amer: 69 mL/min — ABNORMAL LOW (ref >90)
Potassium: 3.3 mEq/L — ABNORMAL LOW (ref 3.5–5.1)
Total Protein: 6.4 g/dL (ref 6.0–8.3)

## 2011-07-04 LAB — DIFFERENTIAL
Basophils Relative: 0 % (ref 0–1)
Eosinophils Absolute: 0 10*3/uL (ref 0.0–0.7)
Eosinophils Relative: 0 % (ref 0–5)
Monocytes Absolute: 0 10*3/uL — ABNORMAL LOW (ref 0.1–1.0)
Monocytes Relative: 0 % — ABNORMAL LOW (ref 3–12)
Neutrophils Relative %: 96 % — ABNORMAL HIGH (ref 43–77)

## 2011-07-04 LAB — URINE MICROSCOPIC-ADD ON

## 2011-07-04 LAB — CBC
Hemoglobin: 10.1 g/dL — ABNORMAL LOW (ref 12.0–15.0)
MCH: 28 pg (ref 26.0–34.0)
MCHC: 32.8 g/dL (ref 30.0–36.0)
MCV: 85.3 fL (ref 78.0–100.0)

## 2011-07-04 LAB — URINALYSIS, ROUTINE W REFLEX MICROSCOPIC
Nitrite: NEGATIVE
Protein, ur: NEGATIVE mg/dL
Specific Gravity, Urine: 1.01 (ref 1.005–1.030)
Urobilinogen, UA: 0.2 mg/dL (ref 0.0–1.0)

## 2011-07-04 LAB — POCT I-STAT TROPONIN I: Troponin i, poc: 0.01 ng/mL (ref 0.00–0.08)

## 2011-07-04 MED ORDER — POTASSIUM CHLORIDE CRYS ER 20 MEQ PO TBCR: 20.0000 meq | EXTENDED_RELEASE_TABLET | Freq: Two times a day (BID) | ORAL | Status: DC

## 2011-07-04 MED ORDER — POTASSIUM CHLORIDE CRYS ER 20 MEQ PO TBCR
40.0000 meq | EXTENDED_RELEASE_TABLET | Freq: Once | ORAL | Status: AC
  Administered 2011-07-04: 40 meq via ORAL
  Filled 2011-07-04: qty 2

## 2011-07-04 MED ORDER — ESOMEPRAZOLE MAGNESIUM 40 MG PO CPDR: 40.0000 mg | DELAYED_RELEASE_CAPSULE | Freq: Every day | ORAL | Status: DC

## 2011-07-04 MED ORDER — ONDANSETRON HCL 4 MG/2ML IJ SOLN
INTRAMUSCULAR | Status: AC
  Filled 2011-07-04: qty 2

## 2011-07-04 MED ORDER — SODIUM CHLORIDE 0.9 % IV BOLUS (SEPSIS)
1000.0000 mL | Freq: Once | INTRAVENOUS | Status: AC
  Administered 2011-07-04: 1000 mL via INTRAVENOUS

## 2011-07-04 MED ORDER — GI COCKTAIL ~~LOC~~
30.0000 mL | Freq: Once | ORAL | Status: AC
  Administered 2011-07-04: 30 mL via ORAL
  Filled 2011-07-04: qty 30

## 2011-07-04 MED ORDER — FAMOTIDINE IN NACL 20-0.9 MG/50ML-% IV SOLN
20.0000 mg | Freq: Once | INTRAVENOUS | Status: AC
  Administered 2011-07-04: 20 mg via INTRAVENOUS
  Filled 2011-07-04: qty 50

## 2011-07-04 NOTE — ED Notes (Signed)
Chemo pt last treated on Wednesday, onset of diarrhea, black and watery (takes iron). Nausea w/o emesis PTA. Received Zofran 4 mg IV per EMS. While in bathroom became diaphoretic, clammy, dizzy, fell returning to bedroom. Struck head on plastic storage container, no loss of consciousness. Describes pain left back beneath shoulder blade

## 2011-07-04 NOTE — ED Provider Notes (Signed)
History     CSN: [id number]  Arrival date & time 07/04/11  1758   First MD Initiated Contact with Patient 07/04/11 1834      Chief Complaint  Patient presents with  . Near Syncope    (Consider location/radiation/quality/duration/timing/severity/associated sxs/prior treatment) HPI  Patient relates she had lung cancer about 20 years ago. She has had a recent recurrence. She is getting her second round of chemotherapy 4 days ago. He relates after the first round it made her have some indigestion and feel lightheaded. She relates this time she started feeling bad with again indigestion which she describes as a burning and a discomfort behind her sternum. She's had nausea without vomiting. Last night she had diarrhea about 2-3 times and about 3 times a day. She has some abdominal cramping without pain. She relates this afternoon she had gone to the bathroom and when she was walking back to her bedroom she possibly passed out. She fell between the bed and the dresser and was able to call for her granddaughter to come help her. Her granddaughter reported she was very diaphoretic and was slowly responsive to her. Patient wears oxygen at home most of the time. She also sees Dr. 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  PCP none Pulmonologist Dr. Delton Coombes Cardiologist Dr. Excell Seltzer  Past Medical History  Diagnosis Date  . Polyneuropathy in diabetes   . Restless legs syndrome (RLS)   . Chronic sinusitis   . Hypertension   . Hyperlipidemia   . Type II or unspecified type diabetes mellitus without mention of complication, not stated as uncontrolled   . Anemia, unspecified   . GERD (gastroesophageal reflux disease)   . Respiratory failure with hypoxia 01/02/2010  . Complication of anesthesia     slow to wake up  . Adenocarcinoma, lung     upper left lobe  . COPD (chronic obstructive  pulmonary disease)   . Emphysema   . Asthma   . Coronary artery disease     Dr. Excell Seltzer had stress test done  . Dysrhythmia     unsure what type  . Peripheral vascular disease   . Non-small cell lung cancer 03/31/2011    Adenocarcinoma Rt upper lobe mass    Past Surgical History  Procedure Date  . Lung lobectomy     left  . Septoplasty   . Total hip arthroplasty 01/2010    left  . Bronchoscopy 02/2011  . Tubal ligation     Family History  Problem Relation Age of Onset  . Bone cancer Father   . Diabetes Mother     History  Substance Use Topics  . Smoking status: Former Smoker -- 1.0 packs/day for 40 years    Types: Cigarettes    Quit date: [id number]  . Smokeless tobacco: Never Used  . Alcohol Use: No     quit 1999  lives with family Uses oxygen at home  OB History    Grav Para Term Preterm Abortions TAB SAB Ect Mult Living                  Review of Systems  All other systems reviewed and are negative.    Allergies  Iohexol; Codeine; and Penicillins  Home Medications   Current Outpatient Rx  Name Route Sig Dispense Refill  . ALBUTEROL SULFATE HFA 108 (90 BASE) MCG/ACT IN AERS  Inhalation Inhale 2 puffs into the lungs every 6 (six) hours as needed. For shortness of breath    . IPRATROPIUM-ALBUTEROL 18-103 MCG/ACT IN AERO Inhalation Inhale 2 puffs into the lungs every 6 (six) hours as needed. For shortness of breath    . ALPRAZOLAM 1 MG PO TABS Oral Take 1 mg by mouth 3 (three) times daily as needed. For anxiety    . ASPIRIN EC 325 MG PO TBEC Oral Take 325 mg by mouth daily.      Marland Kitchen DEXAMETHASONE 4 MG PO TABS Oral Take 4 mg by mouth 2 (two) times daily with a meal. Take 1 tablet twice a day the day before, the day of and the day after chemotherapy.     Marland Kitchen DILTIAZEM HCL ER COATED BEADS 240 MG PO CP24 Oral Take 1 capsule (240 mg total) by mouth daily. 30 capsule 3  . FLUTICASONE-SALMETEROL 250-50 MCG/DOSE IN AEPB Inhalation Inhale 1 puff into the lungs every 12  (twelve) hours.    Marland Kitchen FOLIC ACID 1 MG PO TABS Oral Take 1 mg by mouth daily.    Marland Kitchen GEMFIBROZIL 600 MG PO TABS Oral Take 600 mg by mouth daily.     . GUAIFENESIN ER 600 MG PO TB12 Oral Take 600 mg by mouth daily as needed. For congestion    . HYDROCODONE-ACETAMINOPHEN 7.5-750 MG PO TABS Oral Take 1 tablet by mouth every 6 (six) hours as needed. For severe pain    . LOSARTAN POTASSIUM 50 MG PO TABS Oral Take 50 mg by mouth 2 (two) times daily.      Marland Kitchen METFORMIN HCL 500 MG PO TABS Oral Take 500 mg by mouth 2 (two) times daily with a meal.     . OMEPRAZOLE 20 MG PO CPDR Oral Take 20 mg by mouth daily.     Marland Kitchen POLYETHYLENE GLYCOL 3350 PO PACK Oral Take 17 g by mouth daily as needed. For constipation    . POTASSIUM CHLORIDE CRYS ER 20 MEQ PO TBCR Oral Take 1 tablet (20 mEq total) by mouth 2 (two) times daily. 60 tablet 6  . PROCHLORPERAZINE MALEATE 10 MG PO TABS Oral Take 10 mg by mouth every 6 (six) hours as needed. For nausea    . SENNA 8.6 MG PO TABS Oral Take 1 tablet by mouth daily as needed. For constipation    . SUCRALFATE 1 G PO TABS Oral Take 1 tablet (1 g total) by mouth 4 (four) times daily. Dissolve in 15 ml water. 90 tablet 1  . TRAMADOL HCL 50 MG PO TABS Oral Take 50 mg by mouth every 4 (four) hours as needed. For moderate pain    . TRIAMTERENE-HCTZ 37.5-25 MG PO TABS Oral Take 1 tablet by mouth daily.    Marland Kitchen LEVOFLOXACIN 500 MG PO TABS Oral Take 1 tablet (500 mg total) by mouth daily. 7 tablet 0  . NON FORMULARY  Oxygen 2L prn      BP 134/42  Pulse 78  Temp(Src) 97.8 F (36.6 C) (Oral)  Resp 16  SpO2 100%  Vital signs normal    Physical Exam  Constitutional: She is oriented to person, place, and time. She appears well-developed and well-nourished.  Non-toxic appearance. She does not appear ill. No distress.  HENT:  Head: Normocephalic and atraumatic.  Right Ear: External ear normal.  Left Ear: External ear normal.  Nose: Nose normal. No mucosal edema or rhinorrhea.    Mouth/Throat: Mucous membranes are normal. No dental abscesses or uvula swelling.  Tongue dry  Eyes: Conjunctivae and EOM are normal. Pupils are equal, round, and reactive to light.  Neck: Normal range of motion and full passive range of motion without pain. Neck supple.  Cardiovascular: Normal rate, regular rhythm and normal heart sounds.  Exam reveals no gallop and no friction rub.   No murmur heard. Pulmonary/Chest: Effort normal and breath sounds normal. No respiratory distress. She has no wheezes. She has no rhonchi. She has no rales. She exhibits no tenderness and no crepitus.  Abdominal: Soft. Normal appearance and bowel sounds are normal. She exhibits no distension. There is no tenderness. There is no rebound and no guarding.  Musculoskeletal: Normal range of motion. She exhibits no edema and no tenderness.       Moves all extremities well.   Neurological: She is alert and oriented to person, place, and time. She has normal strength. No cranial nerve deficit.  Skin: Skin is warm, dry and intact. No rash noted. No erythema. No pallor.  Psychiatric: Her speech is normal and behavior is normal. Her mood appears not anxious.       Affect flat sometimes tearful    ED Course  Procedures (including critical care time)    Medications  ondansetron (ZOFRAN) 4 MG/2ML injection (not administered)  sodium chloride 0.9 % bolus 1,000 mL (1000 mL Intravenous Given 07/04/11 2054)  gi cocktail (Maalox,Lidocaine,Donnatal) (30 mL Oral Given 07/04/11 2053)  famotidine (PEPCID) IVPB 20 mg (20 mg Intravenous Given 07/04/11 2053)  potassium chloride SA (K-DUR,KLOR-CON) CR tablet 40 mEq (40 mEq Oral Given 07/04/11 2101)   Pt is feeling better, requests to start on nexium which she has taken in the past, states she has been on prilosec a long time and isn't working any more.    Results for orders placed during the hospital encounter of 07/04/11  CBC      Component Value Range   WBC 9.2  4.0 - 10.5  (K/uL)   RBC 3.61 (*) 3.87 - 5.11 (MIL/uL)   Hemoglobin 10.1 (*) 12.0 - 15.0 (g/dL)   HCT 16.1 (*) 09.6 - 46.0 (%)   MCV 85.3  78.0 - 100.0 (fL)   MCH 28.0  26.0 - 34.0 (pg)   MCHC 32.8  30.0 - 36.0 (g/dL)   RDW 04.5  40.9 - 81.1 (%)   Platelets 325  150 - 400 (K/uL)  DIFFERENTIAL      Component Value Range   Neutrophils Relative 96 (*) 43 - 77 (%)   Neutro Abs 8.8 (*) 1.7 - 7.7 (K/uL)   Lymphocytes Relative 4 (*) 12 - 46 (%)   Lymphs Abs 0.3 (*) 0.7 - 4.0 (K/uL)   Monocytes Relative 0 (*) 3 - 12 (%)   Monocytes Absolute 0.0 (*) 0.1 - 1.0 (K/uL)   Eosinophils Relative 0  0 - 5 (%)   Eosinophils Absolute 0.0  0.0 - 0.7 (K/uL)   Basophils Relative 0  0 - 1 (%)   Basophils Absolute 0.0  0.0 - 0.1 (K/uL)  COMPREHENSIVE METABOLIC PANEL      Component Value Range   Sodium 136  135 - 145 (mEq/L)   Potassium 3.3 (*) 3.5 - 5.1 (mEq/L)   Chloride 99  96 - 112 (mEq/L)   CO2 29  19 - 32 (mEq/L)   Glucose, Bld 112 (*) 70 - 99 (mg/dL)   BUN 20  6 - 23 (mg/dL)   Creatinine, Ser 9.14  0.50 - 1.10 (mg/dL)   Calcium 8.5  8.4 -  10.5 (mg/dL)   Total Protein 6.4  6.0 - 8.3 (g/dL)   Albumin 3.6  3.5 - 5.2 (g/dL)   AST 24  0 - 37 (U/L)   ALT 20  0 - 35 (U/L)   Alkaline Phosphatase 94  39 - 117 (U/L)   Total Bilirubin 0.3  0.3 - 1.2 (mg/dL)   GFR calc non Af Amer 59 (*) >90 (mL/min)   GFR calc Af Amer 69 (*) >90 (mL/min)  POCT I-STAT TROPONIN I      Component Value Range   Troponin i, poc 0.01  0.00 - 0.08 (ng/mL)   Comment 3           URINALYSIS, ROUTINE W REFLEX MICROSCOPIC      Component Value Range   Color, Urine YELLOW  YELLOW    APPearance CLEAR  CLEAR    Specific Gravity, Urine 1.010  1.005 - 1.030    pH 7.0  5.0 - 8.0    Glucose, UA NEGATIVE  NEGATIVE (mg/dL)   Hgb urine dipstick TRACE (*) NEGATIVE    Bilirubin Urine NEGATIVE  NEGATIVE    Ketones, ur NEGATIVE  NEGATIVE (mg/dL)   Protein, ur NEGATIVE  NEGATIVE (mg/dL)   Urobilinogen, UA 0.2  0.0 - 1.0 (mg/dL)   Nitrite NEGATIVE   NEGATIVE    Leukocytes, UA NEGATIVE  NEGATIVE   URINE MICROSCOPIC-ADD ON      Component Value Range   Squamous Epithelial / LPF FEW (*) RARE    WBC, UA 0-2  <3 (WBC/hpf)   RBC / HPF 0-2  <3 (RBC/hpf)  POCT I-STAT TROPONIN I      Component Value Range   Troponin i, poc 0.00  0.00 - 0.08 (ng/mL)   Comment 3            Laboratory interpretation all normal except for anemia, mild hypokalemia   Dg Chest Port 1 View  07/04/2011  *RADIOLOGY REPORT*  Clinical Data: 65 year old female with cough, congestion and near- syncope. History of lung cancer.  PORTABLE CHEST - 1 VIEW  Comparison: 03/31/2011 prior chest radiographs  Findings: The cardiomediastinal silhouette is stable with evidence of previous cardiac surgery. The right suprahilar opacity/mass previously identified is much less prominent. There is no evidence of airspace disease, pleural effusion or pneumothorax. Postoperative changes along the left mediastinum/lung are noted.  IMPRESSION: No evidence of acute cardiopulmonary disease.  Right suprahilar opacity/mass less prominent.  Original Report Authenticated By: Rosendo Gros, M.D.     Date: 07/04/2011  Rate: 77  Rhythm: normal sinus rhythm  QRS Axis: normal  Intervals: normal  ST/T Wave abnormalities: nonspecific ST/T changes  Conduction Disutrbances:none  Narrative Interpretation:   Old EKG Reviewed: unchanged from 01/20/2005     1. GERD (gastroesophageal reflux disease)   2. Syncope   3. Dehydration   4. Hypokalemia    New Prescriptions   ESOMEPRAZOLE (NEXIUM) 40 MG CAPSULE    Take 1 capsule (40 mg total) by mouth daily.   POTASSIUM CHLORIDE SA (K-DUR,KLOR-CON) 20 MEQ TABLET    Take 1 tablet (20 mEq total) by mouth 2 (two) times daily.   Plan discharge Devoria Albe, MD, FACEP    MDM           Ward Givens, MD 07/04/11 [phone]

## 2011-07-04 NOTE — Discharge Instructions (Signed)
Drink plenty of fluids. Take the potassium pills until gone. Stop the Prilosec and start the Nexium. Recheck if you feel worse again.

## 2011-07-05 ENCOUNTER — Encounter: Payer: Self-pay | Admitting: *Deleted

## 2011-07-05 ENCOUNTER — Telehealth: Payer: Self-pay | Admitting: *Deleted

## 2011-07-05 ENCOUNTER — Other Ambulatory Visit: Payer: Self-pay | Admitting: Physician Assistant

## 2011-07-05 ENCOUNTER — Telehealth: Payer: Self-pay | Admitting: Medical Oncology

## 2011-07-05 ENCOUNTER — Other Ambulatory Visit: Payer: Self-pay | Admitting: *Deleted

## 2011-07-05 DIAGNOSIS — C3490 Malignant neoplasm of unspecified part of unspecified bronchus or lung: Secondary | ICD-10-CM

## 2011-07-05 DIAGNOSIS — C3410 Malignant neoplasm of upper lobe, unspecified bronchus or lung: Secondary | ICD-10-CM

## 2011-07-05 MED ORDER — PANTOPRAZOLE SODIUM 40 MG PO TBEC: 40.0000 mg | DELAYED_RELEASE_TABLET | Freq: Every day | ORAL | Status: DC

## 2011-07-05 NOTE — Telephone Encounter (Signed)
Referred pt to [facility] to assess home care needs . Pt has personal aide 3 hours a day

## 2011-07-05 NOTE — Telephone Encounter (Signed)
Called patient who says she does not have a PCP.  Asked that she work on this but at this time we will call in the pantoprazole.

## 2011-07-05 NOTE — Telephone Encounter (Signed)
Returned pts call. She went to ED last night for  dyspnea, pain, indigestion which started since she began chemo. She has rx for nexium.

## 2011-07-05 NOTE — Telephone Encounter (Signed)
CALLED PT. SHE LIVES ALONE AND IS HAVING A DIFFICULT TIME WITH THE SIDE AFFECTS OF HER CHEMO. SPOKE WITH DR.MOHAMED'S NURSE, DIANE BELL,RN. SHE WILL TALK WITH PT. AND MAKE A REFERRAL TO THE SOCIAL WORKER. NOTIFIED JOSEPHINE. SHE VOICES UNDERSTANDING.

## 2011-07-05 NOTE — Telephone Encounter (Signed)
Patient called reporting she went to the ER yesterday.  History of an ulcer and feels chemotherapy has exasperated the ulcer.  Hurts so bad and can't eat.  Was given a GI cocktail which lasted ten minutes.  A prescription for nexium was given to patient but [facility] told her this interacts with a medication she takes.  Patient asked if she can take mylanta or have tagamet called in.  Says she does not have a GI because she hasn't had a problem in years.  Noticed the problem of burning between her breast starting after second chemo treatment.    [facility] [phone]).  Pharmacist reports she takes citalopram 40mg  and nexium will increase risk of arrythmia's.  The pantoprazole does not have this risk per pharmacist.  Will notify providers and update med list.

## 2011-07-06 ENCOUNTER — Other Ambulatory Visit: Payer: Self-pay | Admitting: Cardiovascular Disease

## 2011-07-06 ENCOUNTER — Other Ambulatory Visit: Payer: Medicare Other | Admitting: Lab

## 2011-07-06 ENCOUNTER — Other Ambulatory Visit: Payer: Self-pay | Admitting: Emergency Medicine

## 2011-07-06 MED ORDER — DILTIAZEM HCL ER COATED BEADS 240 MG PO CP24: 240.0000 mg | ORAL_CAPSULE | Freq: Every day | ORAL | Status: DC

## 2011-07-06 MED ORDER — ALBUTEROL SULFATE HFA 108 (90 BASE) MCG/ACT IN AERS: 2.0000 | INHALATION_SPRAY | Freq: Four times a day (QID) | RESPIRATORY_TRACT | Status: DC | PRN

## 2011-07-06 NOTE — Telephone Encounter (Signed)
New Msg: Please call RX in ASAP, pt only has one pill left.

## 2011-07-06 NOTE — Telephone Encounter (Signed)
REFILLED MEDICATION 

## 2011-07-14 ENCOUNTER — Telehealth: Payer: Self-pay | Admitting: Emergency Medicine

## 2011-07-14 ENCOUNTER — Other Ambulatory Visit: Payer: Self-pay | Admitting: *Deleted

## 2011-07-14 DIAGNOSIS — B370 Candidal stomatitis: Secondary | ICD-10-CM

## 2011-07-14 MED ORDER — FLUCONAZOLE 100 MG PO TABS: 100.0000 mg | ORAL_TABLET | Freq: Every day | ORAL | Status: DC

## 2011-07-14 MED ORDER — LORATADINE 10 MG PO TABS: 10.0000 mg | ORAL_TABLET | Freq: Every day | ORAL | Status: DC

## 2011-07-14 NOTE — Telephone Encounter (Signed)
Pt called stating that her mouth is really burning and it is white.  She has been using biotene with no relief.  Per Dr Donnald Garre, okay to give diflucan 100mg  x 10 days.  Pt verbalized understanding.  SLJ

## 2011-07-14 NOTE — Telephone Encounter (Signed)
PT c/o mouth feeling raw from chemo.  Pt instructed to call oncologist regarding this.  Pt also requests an rx for Claritin .  She reports that Dr Delton Coombes has given this to her in the past because insurance will pay for it this way.  Rx Sent to [facility].

## 2011-07-15 ENCOUNTER — Other Ambulatory Visit: Payer: Self-pay | Admitting: *Deleted

## 2011-07-15 ENCOUNTER — Other Ambulatory Visit: Payer: Medicare Other | Admitting: Lab

## 2011-07-15 ENCOUNTER — Ambulatory Visit: Admission: RE | Admit: 2011-07-15 | Payer: Medicare Other | Source: Ambulatory Visit | Admitting: Radiation Oncology

## 2011-07-15 DIAGNOSIS — B370 Candidal stomatitis: Secondary | ICD-10-CM

## 2011-07-15 MED ORDER — NYSTATIN 100000 UNIT/ML MT SUSP: 500000.0000 [IU] | Freq: Four times a day (QID) | OROMUCOSAL | Status: DC

## 2011-07-15 NOTE — Telephone Encounter (Signed)
Received all from pt's pharmacy that there is an adverse interaction between taking celexa and diflucan.  Per Tiana Loft PAC, okay to switch to nystatin.  Pharmacy will inform patient.  SLJ

## 2011-07-20 DIAGNOSIS — M159 Polyosteoarthritis, unspecified: Secondary | ICD-10-CM | POA: Diagnosis not present

## 2011-07-20 DIAGNOSIS — B0223 Postherpetic polyneuropathy: Secondary | ICD-10-CM | POA: Diagnosis not present

## 2011-07-20 DIAGNOSIS — M47817 Spondylosis without myelopathy or radiculopathy, lumbosacral region: Secondary | ICD-10-CM | POA: Diagnosis not present

## 2011-07-21 ENCOUNTER — Ambulatory Visit (HOSPITAL_BASED_OUTPATIENT_CLINIC_OR_DEPARTMENT_OTHER): Payer: Medicare Other | Admitting: Internal Medicine

## 2011-07-21 ENCOUNTER — Telehealth: Payer: Self-pay | Admitting: Internal Medicine

## 2011-07-21 ENCOUNTER — Other Ambulatory Visit (HOSPITAL_BASED_OUTPATIENT_CLINIC_OR_DEPARTMENT_OTHER): Payer: Medicare Other | Admitting: Lab

## 2011-07-21 ENCOUNTER — Ambulatory Visit: Payer: Medicare Other | Admitting: Nutrition

## 2011-07-21 ENCOUNTER — Encounter (HOSPITAL_COMMUNITY)
Admission: RE | Admit: 2011-07-21 | Discharge: 2011-07-21 | Disposition: A | Discharge status: Home / Self Care | Payer: Medicare HMO | Source: Ambulatory Visit | Attending: Internal Medicine | Admitting: Internal Medicine

## 2011-07-21 ENCOUNTER — Ambulatory Visit: Payer: Medicare Other | Admitting: Lab

## 2011-07-21 ENCOUNTER — Ambulatory Visit (HOSPITAL_BASED_OUTPATIENT_CLINIC_OR_DEPARTMENT_OTHER): Payer: Medicare Other

## 2011-07-21 ENCOUNTER — Ambulatory Visit: Payer: Medicare Other

## 2011-07-21 VITALS — BP 141/65 | HR 94 | Temp 97.6°F | Wt 175.6 lb

## 2011-07-21 DIAGNOSIS — C3490 Malignant neoplasm of unspecified part of unspecified bronchus or lung: Secondary | ICD-10-CM

## 2011-07-21 DIAGNOSIS — R5381 Other malaise: Secondary | ICD-10-CM

## 2011-07-21 DIAGNOSIS — D6481 Anemia due to antineoplastic chemotherapy: Secondary | ICD-10-CM | POA: Diagnosis not present

## 2011-07-21 DIAGNOSIS — Z5111 Encounter for antineoplastic chemotherapy: Secondary | ICD-10-CM

## 2011-07-21 DIAGNOSIS — D649 Anemia, unspecified: Secondary | ICD-10-CM | POA: Insufficient documentation

## 2011-07-21 DIAGNOSIS — R5383 Other fatigue: Secondary | ICD-10-CM | POA: Diagnosis not present

## 2011-07-21 DIAGNOSIS — T451X5A Adverse effect of antineoplastic and immunosuppressive drugs, initial encounter: Secondary | ICD-10-CM

## 2011-07-21 DIAGNOSIS — C3410 Malignant neoplasm of upper lobe, unspecified bronchus or lung: Secondary | ICD-10-CM | POA: Diagnosis not present

## 2011-07-21 LAB — CBC WITH DIFFERENTIAL/PLATELET
BASO%: 0.1 % (ref 0.0–2.0)
Basophils Absolute: 0 10*3/uL (ref 0.0–0.1)
HCT: 22.2 % — ABNORMAL LOW (ref 34.8–46.6)
HGB: 7.2 g/dL — ABNORMAL LOW (ref 11.6–15.9)
LYMPH%: 4.4 % — ABNORMAL LOW (ref 14.0–49.7)
MCH: 27.6 pg (ref 25.1–34.0)
MCHC: 32.4 g/dL (ref 31.5–36.0)
MONO#: 0.7 10*3/uL (ref 0.1–0.9)
NEUT%: 89.3 % — ABNORMAL HIGH (ref 38.4–76.8)
Platelets: 321 10*3/uL (ref 145–400)
lymph#: 0.5 10*3/uL — ABNORMAL LOW (ref 0.9–3.3)

## 2011-07-21 LAB — TECHNOLOGIST REVIEW

## 2011-07-21 MED ORDER — ONDANSETRON 16 MG/50ML IVPB (CHCC)
16.0000 mg | Freq: Once | INTRAVENOUS | Status: AC
  Administered 2011-07-21: 16 mg via INTRAVENOUS

## 2011-07-21 MED ORDER — SODIUM CHLORIDE 0.9 % IV SOLN
496.0000 mg | Freq: Once | INTRAVENOUS | Status: AC
  Administered 2011-07-21: 500 mg via INTRAVENOUS
  Filled 2011-07-21: qty 50

## 2011-07-21 MED ORDER — SODIUM CHLORIDE 0.9 % IV SOLN
Freq: Once | INTRAVENOUS | Status: AC
  Administered 2011-07-21: 14:00:00 via INTRAVENOUS

## 2011-07-21 MED ORDER — DEXAMETHASONE SODIUM PHOSPHATE 4 MG/ML IJ SOLN
20.0000 mg | Freq: Once | INTRAMUSCULAR | Status: AC
  Administered 2011-07-21: 20 mg via INTRAVENOUS

## 2011-07-21 MED ORDER — SODIUM CHLORIDE 0.9 % IV SOLN
500.0000 mg/m2 | Freq: Once | INTRAVENOUS | Status: AC
  Administered 2011-07-21: 975 mg via INTRAVENOUS
  Filled 2011-07-21: qty 39

## 2011-07-21 MED ORDER — CYANOCOBALAMIN 1000 MCG/ML IJ SOLN
1000.0000 ug | Freq: Once | INTRAMUSCULAR | Status: AC
  Administered 2011-07-21: 1000 ug via INTRAMUSCULAR

## 2011-07-21 NOTE — Patient Instructions (Signed)
Patient aware of next appointment; discharged home with no complaints. 

## 2011-07-21 NOTE — Progress Notes (Signed)
[facility] [phone]   [phone]  OFFICE PROGRESS NOTE  [name]., MD, MD [address] [employer] [place] [address]  DIAGNOSIS: Stage IIB/IIIA non-small cell lung cancer consistent with adenocarcinoma diagnosed in December 2012.   PRIOR THERAPY: status post radiation therapy to the right upper lobe lung mass completed on 06/04/2011 under the care of Dr. Michell Heinrich.   CURRENT THERAPY: Systemic chemotherapy with carboplatin for AUC of 5 and Alimta 500 mg/M2 giving every 3 weeks, status post 2 cycles.  INTERVAL HISTORY: [name] 66 y.o. female returns to the clinic today for followup visit. The patient tolerated the second cycle of her chemotherapy fairly well except for increasing fatigue and weakness. She has some dizzy spells recently. She denied having any significant fever or chills, no nausea or vomiting. She still has poor appetite. She denied having any significant chest pain but has baseline shortness of breath and currently on home oxygen. She is here today to start cycle #3 of her chemotherapy.  MEDICAL HISTORY: Past Medical History  Diagnosis Date  . Polyneuropathy in diabetes   . Restless legs syndrome (RLS)   . Chronic sinusitis   . Hypertension   . Hyperlipidemia   . Type II or unspecified type diabetes mellitus without mention of complication, not stated as uncontrolled   . Anemia, unspecified   . GERD (gastroesophageal reflux disease)   . Respiratory failure with hypoxia 01/02/2010  . Complication of anesthesia     slow to wake up  . Adenocarcinoma, lung     upper left lobe  . COPD (chronic obstructive pulmonary disease)   . Emphysema   . Asthma   . Coronary artery disease     Dr. Excell Seltzer had stress test done  . Dysrhythmia     unsure what type  . Peripheral vascular disease   . Non-small cell lung cancer 03/31/2011    Adenocarcinoma Rt upper lobe mass    ALLERGIES:  is allergic to  iohexol; codeine; and penicillins.  MEDICATIONS:  Current Outpatient Prescriptions  Medication Sig Dispense Refill  . albuterol (PROAIR HFA) 108 (90 BASE) MCG/ACT inhaler Inhale 2 puffs into the lungs every 6 (six) hours as needed. For shortness of breath  1 Inhaler  2  . albuterol-ipratropium (COMBIVENT) 18-103 MCG/ACT inhaler Inhale 2 puffs into the lungs every 6 (six) hours as needed. For shortness of breath      . ALPRAZolam (XANAX) 1 MG tablet Take 1 mg by mouth 3 (three) times daily as needed. For anxiety      . aspirin EC 325 MG tablet Take 325 mg by mouth daily.        . citalopram (CELEXA) 40 MG tablet Take 40 mg by mouth daily.      Marland Kitchen dexamethasone (DECADRON) 4 MG tablet Take 4 mg by mouth 2 (two) times daily with a meal. Take 1 tablet twice a day the day before, the day of and the day after chemotherapy.       . diltiazem (CARDIZEM CD) 240 MG 24 hr capsule Take 1 capsule (240 mg total) by mouth daily.  30 capsule  6  . Fluticasone-Salmeterol (ADVAIR) 250-50 MCG/DOSE AEPB Inhale 1 puff into the lungs every 12 (twelve) hours.      . folic acid (FOLVITE) 1 MG tablet Take 1 mg by mouth daily.      Marland Kitchen gemfibrozil (LOPID) 600 MG tablet Take 600 mg by mouth daily.       Marland Kitchen  guaiFENesin (MUCINEX) 600 MG 12 hr tablet Take 600 mg by mouth daily as needed. For congestion      . HYDROcodone-acetaminophen (VICODIN ES) 7.5-750 MG per tablet Take 1 tablet by mouth every 6 (six) hours as needed. For severe pain      . loratadine (CLARITIN) 10 MG tablet Take 1 tablet (10 mg total) by mouth daily.  30 tablet  3  . losartan (COZAAR) 50 MG tablet Take 50 mg by mouth 2 (two) times daily.        . metFORMIN (GLUCOPHAGE) 500 MG tablet Take 500 mg by mouth 2 (two) times daily with a meal.       . NON FORMULARY Oxygen 2L prn      . nystatin (MYCOSTATIN) 100000 UNIT/ML suspension Take 5 mLs (500,000 Units total) by mouth 4 (four) times daily.  180 mL  0  . omeprazole (PRILOSEC) 20 MG capsule Take 20 mg by mouth  daily.       . pantoprazole (PROTONIX) 40 MG tablet Take 1 tablet (40 mg total) by mouth daily.  30 tablet  1  . polyethylene glycol (MIRALAX / GLYCOLAX) packet Take 17 g by mouth daily as needed. For constipation      . prochlorperazine (COMPAZINE) 10 MG tablet Take 10 mg by mouth every 6 (six) hours as needed. For nausea      . senna (SENOKOT) 8.6 MG TABS Take 1 tablet by mouth daily as needed. For constipation      . sucralfate (CARAFATE) 1 G tablet Take 1 tablet (1 g total) by mouth 4 (four) times daily. Dissolve in 15 ml water.  90 tablet  1  . traMADol (ULTRAM) 50 MG tablet Take 50 mg by mouth every 4 (four) hours as needed. For moderate pain      . triamcinolone ointment (KENALOG) 0.1 % Apply topically 2 (two) times daily.      Marland Kitchen triamterene-hydrochlorothiazide (MAXZIDE-25) 37.5-25 MG per tablet Take 1 tablet by mouth daily.      . potassium chloride SA (K-DUR,KLOR-CON) 20 MEQ tablet Take 1 tablet (20 mEq total) by mouth 2 (two) times daily.  60 tablet  6  . DISCONTD: calcium carbonate 200 MG capsule Take 250 mg by mouth daily. Does not take everyday      . DISCONTD: potassium chloride SA (K-DUR,KLOR-CON) 20 MEQ tablet Take 1 tablet (20 mEq total) by mouth 2 (two) times daily.  10 tablet  0   No current facility-administered medications for this visit.   Facility-Administered Medications Ordered in Other Visits  Medication Dose Route Frequency Provider Last Rate Last Dose  . 0.9 %  sodium chloride infusion   Intravenous Once Si Gaul, MD      . CARBOplatin (PARAPLATIN) 500 mg in sodium chloride 0.9 % 250 mL chemo infusion  500 mg Intravenous Once Si Gaul, MD   500 mg at 07/21/11 1446  . cyanocobalamin ((VITAMIN B-12)) injection 1,000 mcg  1,000 mcg Intramuscular Once Si Gaul, MD   1,000 mcg at 07/21/11 1435  . dexamethasone (DECADRON) injection 20 mg  20 mg Intravenous Once Si Gaul, MD   20 mg at 07/21/11 1359  . hyaluronate sodium (RADIAPLEXRX) gel    Topical Once Lurline Hare, MD      . ondansetron (ZOFRAN) IVPB 16 mg  16 mg Intravenous Once Si Gaul, MD   16 mg at 07/21/11 1359  . PEMEtrexed (ALIMTA) 975 mg in sodium chloride 0.9 % 100 mL chemo infusion  500 mg/m2 (  Treatment Plan Actual) Intravenous Once Si Gaul, MD   975 mg at 07/21/11 1433  . topical emolient (BIAFINE) emulsion   Topical PRN Lurline Hare, MD        SURGICAL HISTORY:  Past Surgical History  Procedure Date  . Lung lobectomy     left  . Septoplasty   . Total hip arthroplasty 01/2010    left  . Bronchoscopy 02/2011  . Tubal ligation     REVIEW OF SYSTEMS:  A comprehensive review of systems was negative except for: Constitutional: positive for anorexia and fatigue Respiratory: positive for cough, dyspnea on exertion and wheezing Musculoskeletal: positive for muscle weakness   PHYSICAL EXAMINATION: General appearance: alert, cooperative, fatigued and no distress Head: Normocephalic, without obvious abnormality, atraumatic Neck: no adenopathy Lymph nodes: Cervical, supraclavicular, and axillary nodes normal. Resp: wheezes bilaterally Cardio: regular rate and rhythm, S1, S2 normal, no murmur, click, rub or gallop GI: soft, non-tender; bowel sounds normal; no masses,  no organomegaly Extremities: extremities normal, atraumatic, no cyanosis or edema Neurologic: Alert and oriented X 3, normal strength and tone. Normal symmetric reflexes. Normal coordination and gait  ECOG PERFORMANCE STATUS: 1 - Symptomatic but completely ambulatory  Blood pressure 141/65, pulse 94, temperature 97.6 F (36.4 C), temperature source Oral, weight 175 lb 9.6 oz (79.652 kg).  LABORATORY DATA: Lab Results  Component Value Date   WBC 10.8* 07/21/2011   HGB 7.2* 07/21/2011   HCT 22.2* 07/21/2011   MCV 85.1 07/21/2011   PLT 321 07/21/2011      Chemistry      Component Value Date/Time   NA 136 07/04/2011 1920   K 3.3* 07/04/2011 1920   CL 99 07/04/2011 1920   CO2  29 07/04/2011 1920   BUN 20 07/04/2011 1920   CREATININE 0.97 07/04/2011 1920      Component Value Date/Time   CALCIUM 8.5 07/04/2011 1920   ALKPHOS 94 07/04/2011 1920   AST 24 07/04/2011 1920   ALT 20 07/04/2011 1920   BILITOT 0.3 07/04/2011 1920       RADIOGRAPHIC STUDIES: Dg Chest Port 1 View  07/04/2011  *RADIOLOGY REPORT*  Clinical Data: 66 year old female with cough, congestion and near- syncope. History of lung cancer.  PORTABLE CHEST - 1 VIEW  Comparison: 03/31/2011 prior chest radiographs  Findings: The cardiomediastinal silhouette is stable with evidence of previous cardiac surgery. The right suprahilar opacity/mass previously identified is much less prominent. There is no evidence of airspace disease, pleural effusion or pneumothorax. Postoperative changes along the left mediastinum/lung are noted.  IMPRESSION: No evidence of acute cardiopulmonary disease.  Right suprahilar opacity/mass less prominent.  Original Report Authenticated By: Rosendo Gros, M.D.    ASSESSMENT: This is a very pleasant 66 years old white female with a stage IIIa non-small cell lung cancer, adenocarcinoma status post radiotherapy to the right upper lobe lung. She is currently on systemic chemotherapy with carboplatin and Alimta status post 2 cycles. The patient is tolerating her treatment fairly well except for fatigue from the chemotherapy-induced anemia.  PLAN:  #1 I would arrange for the patient to have 2 units of red blood cell transfusion today. #2 we'll proceed with systemic chemotherapy with carboplatin and Alimta as scheduled. #3 the patient would come back for followup visit in 3 weeks with the start of the next cycle of her chemotherapy. #4 with repeat CT scan of the chest, abdomen and pelvis for restaging of her disease before her upcoming visit. The patient was advised to call me  immediately if she has any concerning symptoms in the interval.  All questions were answered. The patient knows to call  the clinic with any problems, questions or concerns. We can certainly see the patient much sooner if necessary.  I spent 20 minutes counseling the patient face to face. The total time spent in the appointment was 35 minutes.

## 2011-07-21 NOTE — Telephone Encounter (Signed)
lm that sch was ready and to p/u sch and contrast  aom

## 2011-07-22 ENCOUNTER — Telehealth: Payer: Self-pay | Admitting: Medical Oncology

## 2011-07-22 ENCOUNTER — Encounter (HOSPITAL_COMMUNITY): Payer: Self-pay

## 2011-07-22 ENCOUNTER — Encounter (HOSPITAL_COMMUNITY)
Admission: RE | Admit: 2011-07-22 | Discharge: 2011-07-22 | Disposition: A | Discharge status: Home / Self Care | Payer: Medicare HMO | Source: Ambulatory Visit | Attending: Internal Medicine | Admitting: Internal Medicine

## 2011-07-22 VITALS — BP 142/59 | HR 75 | Temp 98.2°F | Resp 16 | Ht 66.0 in | Wt 175.0 lb

## 2011-07-22 DIAGNOSIS — C3490 Malignant neoplasm of unspecified part of unspecified bronchus or lung: Secondary | ICD-10-CM

## 2011-07-22 LAB — COMPREHENSIVE METABOLIC PANEL
AST: 19 U/L (ref 0–37)
Albumin: 4.1 g/dL (ref 3.5–5.2)
BUN: 11 mg/dL (ref 6–23)
Calcium: 9.6 mg/dL (ref 8.4–10.5)
Chloride: 101 mEq/L (ref 96–112)
Creatinine, Ser: 0.93 mg/dL (ref 0.50–1.10)
Glucose, Bld: 148 mg/dL — ABNORMAL HIGH (ref 70–99)
Potassium: 4 mEq/L (ref 3.5–5.3)

## 2011-07-22 LAB — PREPARE RBC (CROSSMATCH)

## 2011-07-22 MED ORDER — ACETAMINOPHEN 325 MG PO TABS
650.0000 mg | ORAL_TABLET | Freq: Once | ORAL | Status: AC
  Administered 2011-07-22: 650 mg via ORAL

## 2011-07-22 MED ORDER — SODIUM CHLORIDE 0.9 % IJ SOLN: 3.0000 mL | INTRAMUSCULAR | Status: DC | PRN

## 2011-07-22 MED ORDER — DIPHENHYDRAMINE HCL 50 MG/ML IJ SOLN
INTRAMUSCULAR | Status: AC
  Administered 2011-07-22: 25 mg via INTRAVENOUS
  Filled 2011-07-22: qty 1

## 2011-07-22 MED ORDER — SODIUM CHLORIDE 0.9 % IV SOLN
250.0000 mL | Freq: Once | INTRAVENOUS | Status: AC
  Administered 2011-07-22: 250 mL via INTRAVENOUS

## 2011-07-22 MED ORDER — SODIUM CHLORIDE 0.9 % IJ SOLN: 10.0000 mL | INTRAMUSCULAR | Status: DC | PRN

## 2011-07-22 MED ORDER — ACETAMINOPHEN 325 MG PO TABS
ORAL_TABLET | ORAL | Status: AC
  Administered 2011-07-22: 650 mg via ORAL
  Filled 2011-07-22: qty 2

## 2011-07-22 MED ORDER — DIPHENHYDRAMINE HCL 50 MG/ML IJ SOLN
25.0000 mg | Freq: Once | INTRAMUSCULAR | Status: AC
  Administered 2011-07-22: 25 mg via INTRAVENOUS

## 2011-07-22 MED ORDER — HEPARIN SOD (PORK) LOCK FLUSH 100 UNIT/ML IV SOLN: 500.0000 [IU] | Freq: Every day | INTRAVENOUS | Status: DC | PRN

## 2011-07-22 MED ORDER — HEPARIN SOD (PORK) LOCK FLUSH 100 UNIT/ML IV SOLN: 250.0000 [IU] | INTRAVENOUS | Status: DC | PRN

## 2011-07-22 NOTE — Telephone Encounter (Signed)
Feels jittery , has indigestion , sweating. Wants to cancel blood transfusion. I told pt she needs the transfusion today as scheduled . She said she would be there. I told her to eat , take her medications as ordered.She voices understanding.

## 2011-07-22 NOTE — Progress Notes (Signed)
I spoke with Ms. Aller in the chemotherapy area.  She has had difficulty eating and complains of nausea and poor appetite.  Her weight has decreased to 175.6 pounds, documented on the 10th, from 180.6 pounds on the 20th and 190 pounds in January.  She continues to drink Glucerna occasionally.  NUTRITION DIAGNOSIS:  Food and nutrition related knowledge deficit continues.  INTERVENTION:  I continue to educate patient to consume high-protein, high-calorie foods in small amounts throughout the day to provide weight maintenance. I have educated her to consume Glucerna as needed and to try to add a snack with it if she is using that at mealtimes.  I have reinforced which foods have protein and provided specific suggestions for snacks.  I have given her some additional coupons today.  MONITORING/EVALUATION/GOALS:  The patient has been unable to increase her oral intake to maintain her weight.  She will work to increase her oral intake to promote maintenance of lean body mass.  NEXT VISIT:  Wednesday, May 22nd, during chemotherapy.    ______________________________ Zenovia Jarred, RD, LDN Clinical Nutrition Specialist BN/MEDQ  D:  07/21/2011  T:  07/22/2011  Job:  [id number]

## 2011-07-22 NOTE — Discharge Instructions (Signed)
Blood Transfusion Information  WHAT IS A BLOOD TRANSFUSION?  A transfusion is the replacement of blood or some of its parts. Blood is made up of multiple cells which provide different functions.   Red blood cells carry oxygen and are used for blood loss replacement.   White blood cells fight against infection.   Platelets control bleeding.   Plasma helps clot blood.   Other blood products are available for specialized needs, such as hemophilia or other clotting disorders.  BEFORE THE TRANSFUSION   Who gives blood for transfusions?    You may be able to donate blood to be used at a later date on yourself (autologous donation).   Relatives can be asked to donate blood. This is generally not any safer than if you have received blood from a stranger. The same precautions are taken to ensure safety when a relative's blood is donated.   Healthy volunteers who are fully evaluated to make sure their blood is safe. This is blood bank blood.  Transfusion therapy is the safest it has ever been in the practice of medicine. Before blood is taken from a donor, a complete history is taken to make sure that person has no history of diseases nor engages in risky social behavior (examples are intravenous drug use or sexual activity with multiple partners). The donor's travel history is screened to minimize risk of transmitting infections, such as malaria. The donated blood is tested for signs of infectious diseases, such as HIV and hepatitis. The blood is then tested to be sure it is compatible with you in order to minimize the chance of a transfusion reaction. If you or a relative donates blood, this is often done in anticipation of surgery and is not appropriate for emergency situations. It takes many days to process the donated blood.  RISKS AND COMPLICATIONS  Although transfusion therapy is very safe and saves many lives, the main dangers of transfusion include:    Getting an infectious disease.   Developing a  transfusion reaction. This is an allergic reaction to something in the blood you were given. Every precaution is taken to prevent this.  The decision to have a blood transfusion has been considered carefully by your caregiver before blood is given. Blood is not given unless the benefits outweigh the risks.  AFTER THE TRANSFUSION   Right after receiving a blood transfusion, you will usually feel much better and more energetic. This is especially true if your red blood cells have gotten low (anemic). The transfusion raises the level of the red blood cells which carry oxygen, and this usually causes an energy increase.   The nurse administering the transfusion will monitor you carefully for complications.  HOME CARE INSTRUCTIONS   No special instructions are needed after a transfusion. You may find your energy is better. Speak with your caregiver about any limitations on activity for underlying diseases you may have.  SEEK MEDICAL CARE IF:    Your condition is not improving after your transfusion.   You develop redness or irritation at the intravenous (IV) site.  SEEK IMMEDIATE MEDICAL CARE IF:   Any of the following symptoms occur over the next 12 hours:   Shaking chills.   You have a temperature by mouth above 102 F (38.9 C), not controlled by medicine.   Chest, back, or muscle pain.   People around you feel you are not acting correctly or are confused.   Shortness of breath or difficulty breathing.   Dizziness and fainting.     You get a rash or develop hives.   You have a decrease in urine output.   Your urine turns a dark color or changes to pink, red, or brown.  Any of the following symptoms occur over the next 10 days:   You have a temperature by mouth above 102 F (38.9 C), not controlled by medicine.   Shortness of breath.   Weakness after normal activity.   The white part of the eye turns yellow (jaundice).   You have a decrease in the amount of urine or are urinating less often.   Your  urine turns a dark color or changes to pink, red, or brown.  Document Released: 03/26/2000 Document Revised: 03/18/2011 Document Reviewed: 11/13/2007  [employer] Patient Information 2012 [employer].

## 2011-07-23 LAB — TYPE AND SCREEN
ABO/RH(D): O NEG
Antibody Screen: NEGATIVE
Unit division: 0
Unit division: 0

## 2011-07-27 ENCOUNTER — Telehealth: Payer: Self-pay | Admitting: Medical Oncology

## 2011-07-27 NOTE — Telephone Encounter (Signed)
Received blood  transfusion last Friday -since then has been weak , no appetite , watery diarrhea 3x day , nose , mouth raw . States she is drinking extra fluids . She is not taking anything for diarrhea so I told her to get Immodium AS and start it now. I told her if she feels like she is getting worse to go to ED and call if she has temp >/=100.5 f. She voices understanding. I told her to keep appointment tomorrow.

## 2011-07-28 ENCOUNTER — Other Ambulatory Visit: Payer: Self-pay | Admitting: Cardiovascular Disease

## 2011-07-28 ENCOUNTER — Telehealth: Payer: Self-pay | Admitting: Medical Oncology

## 2011-07-28 ENCOUNTER — Other Ambulatory Visit (HOSPITAL_BASED_OUTPATIENT_CLINIC_OR_DEPARTMENT_OTHER): Payer: Medicare Other

## 2011-07-28 DIAGNOSIS — C3490 Malignant neoplasm of unspecified part of unspecified bronchus or lung: Secondary | ICD-10-CM

## 2011-07-28 DIAGNOSIS — K1231 Oral mucositis (ulcerative) due to antineoplastic therapy: Secondary | ICD-10-CM

## 2011-07-28 LAB — CBC WITH DIFFERENTIAL/PLATELET
Basophils Absolute: 0 10*3/uL (ref 0.0–0.1)
Eosinophils Absolute: 0 10*3/uL (ref 0.0–0.5)
HCT: 32.3 % — ABNORMAL LOW (ref 34.8–46.6)
HGB: 10.9 g/dL — ABNORMAL LOW (ref 11.6–15.9)
LYMPH%: 8.7 % — ABNORMAL LOW (ref 14.0–49.7)
MCV: 81.4 fL (ref 79.5–101.0)
MONO#: 0 10*3/uL — ABNORMAL LOW (ref 0.1–0.9)
MONO%: 1.5 % (ref 0.0–14.0)
NEUT#: 2.6 10*3/uL (ref 1.5–6.5)
NEUT%: 89.4 % — ABNORMAL HIGH (ref 38.4–76.8)
Platelets: 207 10*3/uL (ref 145–400)
WBC: 2.9 10*3/uL — ABNORMAL LOW (ref 3.9–10.3)
nRBC: 0 % (ref 0–0)

## 2011-07-28 LAB — COMPREHENSIVE METABOLIC PANEL
ALT: 49 U/L — ABNORMAL HIGH (ref 0–35)
BUN: 26 mg/dL — ABNORMAL HIGH (ref 6–23)
CO2: 25 mEq/L (ref 19–32)
Calcium: 8.8 mg/dL (ref 8.4–10.5)
Chloride: 99 mEq/L (ref 96–112)
Creatinine, Ser: 1.31 mg/dL — ABNORMAL HIGH (ref 0.50–1.10)
Glucose, Bld: 123 mg/dL — ABNORMAL HIGH (ref 70–99)
Total Bilirubin: 0.7 mg/dL (ref 0.3–1.2)

## 2011-07-28 MED ORDER — MAGIC MOUTHWASH W/LIDOCAINE: 5.0000 mL | Freq: Four times a day (QID) | ORAL | Status: DC | PRN

## 2011-07-28 MED ORDER — LOSARTAN POTASSIUM 50 MG PO TABS: 50.0000 mg | ORAL_TABLET | Freq: Two times a day (BID) | ORAL | Status: DC

## 2011-07-28 NOTE — Telephone Encounter (Signed)
Called in MMW with lidocaine to [facility] because [facility] make it. Recipe is  -,maalox 120 ml ; nystatin ( 100,000 units /ml)  =60 ml ; benadryl elixir =338ml  ,; viscous lidocaine 2%=40 ml and mix with 120 ml of mmw total #480 ml

## 2011-08-03 ENCOUNTER — Telehealth: Payer: Self-pay | Admitting: Medical Oncology

## 2011-08-03 NOTE — Telephone Encounter (Signed)
Reports 1/2 TBSP pink tinged sputum when coughing -2-3 times a day.I told her to monitor it for now and to call 911 or go to ED for uncontrolled hemoptysis . She also confirmed her next scan and I gave her the phone number to radiology so she can change the time per her request.

## 2011-08-04 ENCOUNTER — Telehealth: Payer: Self-pay | Admitting: Medical Oncology

## 2011-08-04 ENCOUNTER — Encounter (HOSPITAL_COMMUNITY): Payer: Self-pay | Admitting: Physician Assistant

## 2011-08-04 ENCOUNTER — Other Ambulatory Visit: Payer: Medicare Other | Admitting: Lab

## 2011-08-04 ENCOUNTER — Ambulatory Visit (HOSPITAL_BASED_OUTPATIENT_CLINIC_OR_DEPARTMENT_OTHER): Payer: Medicare Other | Admitting: Internal Medicine

## 2011-08-04 ENCOUNTER — Inpatient Hospital Stay (HOSPITAL_COMMUNITY)
Admission: AD | Admit: 2011-08-04 | Discharge: 2011-08-11 | DRG: 808 | Disposition: A | Discharge status: Home / Self Care | Payer: Medicare HMO | Source: Ambulatory Visit | Attending: Internal Medicine | Admitting: Internal Medicine

## 2011-08-04 VITALS — BP 132/52 | HR 97 | Temp 97.8°F | Wt 170.2 lb

## 2011-08-04 DIAGNOSIS — C3410 Malignant neoplasm of upper lobe, unspecified bronchus or lung: Secondary | ICD-10-CM

## 2011-08-04 DIAGNOSIS — R5383 Other fatigue: Secondary | ICD-10-CM | POA: Diagnosis present

## 2011-08-04 DIAGNOSIS — R0902 Hypoxemia: Secondary | ICD-10-CM | POA: Diagnosis present

## 2011-08-04 DIAGNOSIS — J9600 Acute respiratory failure, unspecified whether with hypoxia or hypercapnia: Secondary | ICD-10-CM | POA: Diagnosis present

## 2011-08-04 DIAGNOSIS — J449 Chronic obstructive pulmonary disease, unspecified: Secondary | ICD-10-CM

## 2011-08-04 DIAGNOSIS — I2510 Atherosclerotic heart disease of native coronary artery without angina pectoris: Secondary | ICD-10-CM

## 2011-08-04 DIAGNOSIS — D709 Neutropenia, unspecified: Secondary | ICD-10-CM | POA: Diagnosis not present

## 2011-08-04 DIAGNOSIS — D649 Anemia, unspecified: Secondary | ICD-10-CM

## 2011-08-04 DIAGNOSIS — E785 Hyperlipidemia, unspecified: Secondary | ICD-10-CM

## 2011-08-04 DIAGNOSIS — K1231 Oral mucositis (ulcerative) due to antineoplastic therapy: Secondary | ICD-10-CM

## 2011-08-04 DIAGNOSIS — T451X5A Adverse effect of antineoplastic and immunosuppressive drugs, initial encounter: Principal | ICD-10-CM

## 2011-08-04 DIAGNOSIS — D61810 Antineoplastic chemotherapy induced pancytopenia: Principal | ICD-10-CM | POA: Diagnosis present

## 2011-08-04 DIAGNOSIS — R5381 Other malaise: Secondary | ICD-10-CM | POA: Diagnosis present

## 2011-08-04 DIAGNOSIS — R0989 Other specified symptoms and signs involving the circulatory and respiratory systems: Secondary | ICD-10-CM | POA: Diagnosis present

## 2011-08-04 DIAGNOSIS — D61818 Other pancytopenia: Secondary | ICD-10-CM

## 2011-08-04 DIAGNOSIS — J9691 Respiratory failure, unspecified with hypoxia: Secondary | ICD-10-CM

## 2011-08-04 DIAGNOSIS — F419 Anxiety disorder, unspecified: Secondary | ICD-10-CM

## 2011-08-04 DIAGNOSIS — C3490 Malignant neoplasm of unspecified part of unspecified bronchus or lung: Secondary | ICD-10-CM

## 2011-08-04 DIAGNOSIS — I739 Peripheral vascular disease, unspecified: Secondary | ICD-10-CM

## 2011-08-04 DIAGNOSIS — J329 Chronic sinusitis, unspecified: Secondary | ICD-10-CM

## 2011-08-04 DIAGNOSIS — E119 Type 2 diabetes mellitus without complications: Secondary | ICD-10-CM | POA: Diagnosis present

## 2011-08-04 DIAGNOSIS — Z902 Acquired absence of lung [part of]: Secondary | ICD-10-CM

## 2011-08-04 DIAGNOSIS — D696 Thrombocytopenia, unspecified: Secondary | ICD-10-CM

## 2011-08-04 DIAGNOSIS — J4489 Other specified chronic obstructive pulmonary disease: Secondary | ICD-10-CM

## 2011-08-04 LAB — COMPREHENSIVE METABOLIC PANEL
ALT: 26 U/L (ref 0–35)
AST: 25 U/L (ref 0–37)
Albumin: 3.9 g/dL (ref 3.5–5.2)
BUN: 19 mg/dL (ref 6–23)
CO2: 26 mEq/L (ref 19–32)
Calcium: 8.6 mg/dL (ref 8.4–10.5)
Chloride: 99 mEq/L (ref 96–112)
Potassium: 3.7 mEq/L (ref 3.5–5.3)

## 2011-08-04 LAB — CBC WITH DIFFERENTIAL/PLATELET
BASO%: 0 % (ref 0.0–2.0)
Eosinophils Absolute: 0 10*3/uL (ref 0.0–0.5)
HCT: 22 % — ABNORMAL LOW (ref 34.8–46.6)
MCHC: 33.2 g/dL (ref 31.5–36.0)
MONO#: 0.1 10*3/uL (ref 0.1–0.9)
NEUT#: 0.1 10*3/uL — CL (ref 1.5–6.5)
NEUT%: 11.6 % — ABNORMAL LOW (ref 38.4–76.8)
Platelets: 3 10*3/uL — CL (ref 145–400)
WBC: 0.4 10*3/uL — CL (ref 3.9–10.3)
lymph#: 0.2 10*3/uL — ABNORMAL LOW (ref 0.9–3.3)
nRBC: 0 % (ref 0–0)

## 2011-08-04 MED ORDER — METFORMIN HCL 500 MG PO TABS
500.0000 mg | ORAL_TABLET | Freq: Two times a day (BID) | ORAL | Status: DC
  Administered 2011-08-04 – 2011-08-11 (×12): 500 mg via ORAL
  Filled 2011-08-04 (×16): qty 1

## 2011-08-04 MED ORDER — PANTOPRAZOLE SODIUM 40 MG PO TBEC
40.0000 mg | DELAYED_RELEASE_TABLET | Freq: Every day | ORAL | Status: DC
  Administered 2011-08-04 – 2011-08-07 (×4): 40 mg via ORAL
  Filled 2011-08-04 (×8): qty 1

## 2011-08-04 MED ORDER — ACETAMINOPHEN 325 MG PO TABS
650.0000 mg | ORAL_TABLET | Freq: Once | ORAL | Status: AC
  Administered 2011-08-04: 650 mg via ORAL
  Filled 2011-08-04: qty 2

## 2011-08-04 MED ORDER — FILGRASTIM 480 MCG/1.6ML IJ SOLN
480.0000 ug | Freq: Every day | INTRAMUSCULAR | Status: DC
  Administered 2011-08-04 – 2011-08-07 (×4): 480 ug via SUBCUTANEOUS
  Filled 2011-08-04 (×6): qty 1.6

## 2011-08-04 MED ORDER — PROCHLORPERAZINE MALEATE 10 MG PO TABS: 10.0000 mg | ORAL_TABLET | Freq: Four times a day (QID) | ORAL | Status: DC | PRN

## 2011-08-04 MED ORDER — GUAIFENESIN ER 600 MG PO TB12
600.0000 mg | ORAL_TABLET | Freq: Every day | ORAL | Status: DC | PRN
  Filled 2011-08-04 (×2): qty 1

## 2011-08-04 MED ORDER — LORATADINE 10 MG PO TABS
10.0000 mg | ORAL_TABLET | Freq: Every day | ORAL | Status: DC
  Administered 2011-08-04 – 2011-08-10 (×7): 10 mg via ORAL
  Filled 2011-08-04 (×8): qty 1

## 2011-08-04 MED ORDER — DIPHENHYDRAMINE HCL 50 MG/ML IJ SOLN
25.0000 mg | Freq: Once | INTRAMUSCULAR | Status: AC
  Administered 2011-08-04: 25 mg via INTRAVENOUS
  Filled 2011-08-04: qty 1

## 2011-08-04 MED ORDER — TRAMADOL HCL 50 MG PO TABS
50.0000 mg | ORAL_TABLET | Freq: Four times a day (QID) | ORAL | Status: DC | PRN
  Administered 2011-08-06 – 2011-08-08 (×3): 50 mg via ORAL
  Filled 2011-08-04 (×3): qty 1

## 2011-08-04 MED ORDER — LOSARTAN POTASSIUM 50 MG PO TABS
50.0000 mg | ORAL_TABLET | Freq: Two times a day (BID) | ORAL | Status: DC
  Administered 2011-08-04 – 2011-08-11 (×14): 50 mg via ORAL
  Filled 2011-08-04 (×15): qty 1

## 2011-08-04 MED ORDER — HYDROCODONE-ACETAMINOPHEN 5-500 MG PO TABS: 1.5000 | ORAL_TABLET | Freq: Four times a day (QID) | ORAL | Status: DC | PRN

## 2011-08-04 MED ORDER — ALPRAZOLAM 1 MG PO TABS
1.0000 mg | ORAL_TABLET | Freq: Three times a day (TID) | ORAL | Status: DC | PRN
  Administered 2011-08-04 – 2011-08-10 (×11): 1 mg via ORAL
  Filled 2011-08-04 (×11): qty 1

## 2011-08-04 MED ORDER — FLUTICASONE-SALMETEROL 250-50 MCG/DOSE IN AEPB
1.0000 | INHALATION_SPRAY | Freq: Two times a day (BID) | RESPIRATORY_TRACT | Status: DC
  Administered 2011-08-04 – 2011-08-09 (×10): 1 via RESPIRATORY_TRACT
  Filled 2011-08-04: qty 14

## 2011-08-04 MED ORDER — IPRATROPIUM-ALBUTEROL 18-103 MCG/ACT IN AERO
2.0000 | INHALATION_SPRAY | Freq: Four times a day (QID) | RESPIRATORY_TRACT | Status: DC | PRN
  Administered 2011-08-07 – 2011-08-11 (×3): 2 via RESPIRATORY_TRACT
  Filled 2011-08-04: qty 14.7

## 2011-08-04 MED ORDER — POLYETHYLENE GLYCOL 3350 17 G PO PACK
17.0000 g | PACK | Freq: Every day | ORAL | Status: DC | PRN
  Filled 2011-08-04: qty 1

## 2011-08-04 MED ORDER — SODIUM CHLORIDE 0.9 % IV SOLN
INTRAVENOUS | Status: DC
  Administered 2011-08-04 – 2011-08-05 (×2): via INTRAVENOUS

## 2011-08-04 MED ORDER — ALBUTEROL SULFATE HFA 108 (90 BASE) MCG/ACT IN AERS
2.0000 | INHALATION_SPRAY | Freq: Four times a day (QID) | RESPIRATORY_TRACT | Status: DC | PRN
  Filled 2011-08-04: qty 6.7

## 2011-08-04 MED ORDER — MOXIFLOXACIN HCL IN NACL 400 MG/250ML IV SOLN
400.0000 mg | INTRAVENOUS | Status: DC
Start: 2011-08-05 — End: 2011-08-09
  Administered 2011-08-05 – 2011-08-08 (×4): 400 mg via INTRAVENOUS
  Filled 2011-08-04 (×4): qty 250

## 2011-08-04 MED ORDER — DILTIAZEM HCL ER COATED BEADS 240 MG PO CP24
240.0000 mg | ORAL_CAPSULE | Freq: Every day | ORAL | Status: DC
  Administered 2011-08-05 – 2011-08-11 (×7): 240 mg via ORAL
  Filled 2011-08-04 (×8): qty 1

## 2011-08-04 MED ORDER — FOLIC ACID 1 MG PO TABS
1.0000 mg | ORAL_TABLET | Freq: Every day | ORAL | Status: DC
  Administered 2011-08-05 – 2011-08-11 (×7): 1 mg via ORAL
  Filled 2011-08-04 (×8): qty 1

## 2011-08-04 MED ORDER — NON FORMULARY: 2.0000 L | Status: DC

## 2011-08-04 MED ORDER — CITALOPRAM HYDROBROMIDE 40 MG PO TABS
40.0000 mg | ORAL_TABLET | Freq: Every day | ORAL | Status: DC
  Administered 2011-08-05 – 2011-08-08 (×4): 40 mg via ORAL
  Filled 2011-08-04 (×5): qty 1

## 2011-08-04 MED ORDER — HYDROCODONE-ACETAMINOPHEN 7.5-325 MG PO TABS
1.0000 | ORAL_TABLET | Freq: Four times a day (QID) | ORAL | Status: DC | PRN
  Administered 2011-08-07: 1 via ORAL
  Filled 2011-08-04: qty 1

## 2011-08-04 MED ORDER — MOXIFLOXACIN HCL IN NACL 400 MG/250ML IV SOLN
400.0000 mg | Freq: Once | INTRAVENOUS | Status: AC
  Administered 2011-08-04: 400 mg via INTRAVENOUS
  Filled 2011-08-04: qty 250

## 2011-08-04 MED ORDER — MAGIC MOUTHWASH W/LIDOCAINE
5.0000 mL | Freq: Four times a day (QID) | ORAL | Status: DC | PRN
  Administered 2011-08-04 – 2011-08-10 (×4): 5 mL via ORAL
  Filled 2011-08-04 (×5): qty 5

## 2011-08-04 MED ORDER — BIOTENE DRY MOUTH MT LIQD
15.0000 mL | Freq: Two times a day (BID) | OROMUCOSAL | Status: DC
  Administered 2011-08-04 – 2011-08-09 (×7): 15 mL via OROMUCOSAL
  Filled 2011-08-04: qty 15

## 2011-08-04 NOTE — Telephone Encounter (Addendum)
I contacted pt daughter and told her to bring [name] back to [facility] because she will be admitted to [facility] . Dr Donnald Garre to see pt now.

## 2011-08-04 NOTE — Telephone Encounter (Signed)
Called for bed to admit pt.

## 2011-08-04 NOTE — H&P (Signed)
ZAIRE VANBUSKIRK is an 66 y.o. female.   Chief Complaint: Chemotherapy-induced pancytopenia HPI: The patient is a pleasant 66 year old white female with stage IIB/IIIa non-small cell lung cancer consistent with adenocarcinoma that was diagnosed in December 2012. She is status post radiation therapy to the right upper lobe lung mass completed 06/04/2011 and is currently status post 3 cycles of systemic chemotherapy with carboplatin for an AUC of 5 and Alimta at 500 mg per meter squared given every 3 weeks. The patient presented to the [facility] for her weekly labs as scheduled. She was found to be pancytopenic and complained of 2-3 day history of sore throat, fatigue and cough productive of blood-tinged secretions. She reports she's been having significant difficulty with sinus drainage and pressure due to her seasonal allergies. She's had subjective chills but denies any frank fever.  A comprehensive review of systems was negative except for: Constitutional: positive for chills and fatigue Respiratory: positive for cough and Blood-tinged secretions Gastrointestinal: positive for constipation   Past Medical History  Diagnosis Date  . Polyneuropathy in diabetes   . Restless legs syndrome (RLS)   . Chronic sinusitis   . Hypertension   . Hyperlipidemia   . Type II or unspecified type diabetes mellitus without mention of complication, not stated as uncontrolled   . Anemia, unspecified   . GERD (gastroesophageal reflux disease)   . Respiratory failure with hypoxia 01/02/2010  . Complication of anesthesia     slow to wake up  . Adenocarcinoma, lung     upper left lobe  . COPD (chronic obstructive pulmonary disease)   . Emphysema   . Asthma   . Coronary artery disease     Dr. Excell Seltzer had stress test done  . Dysrhythmia     unsure what type  . Peripheral vascular disease   . Non-small cell lung cancer 03/31/2011    Adenocarcinoma Rt upper lobe mass    Past Surgical History    Procedure Date  . Lung lobectomy     left  . Septoplasty   . Total hip arthroplasty 01/2010    left  . Bronchoscopy 02/2011  . Tubal ligation     Family History  Problem Relation Age of Onset  . Bone cancer Father   . Diabetes Mother    Social History:  reports that she quit smoking about 14 years ago. Her smoking use included Cigarettes. She has a 60 pack-year smoking history. She has never used smokeless tobacco. She reports that she does not drink alcohol. Her drug history not on file.  Allergies:  Allergies  Allergen Reactions  . Iohexol Shortness Of Breath and Other (See Comments)    Burning of skin  . Codeine     REACTION: GI upset  . Penicillins     REACTION: itching    Medications Prior to Admission  Medication Sig Dispense Refill  . albuterol (PROAIR HFA) 108 (90 BASE) MCG/ACT inhaler Inhale 2 puffs into the lungs every 6 (six) hours as needed. For shortness of breath  1 Inhaler  2  . albuterol-ipratropium (COMBIVENT) 18-103 MCG/ACT inhaler Inhale 2 puffs into the lungs every 6 (six) hours as needed. For shortness of breath      . ALPRAZolam (XANAX) 1 MG tablet Take 1 mg by mouth 3 (three) times daily as needed. For anxiety      . Alum & Mag Hydroxide-Simeth (MAGIC MOUTHWASH W/LIDOCAINE) SOLN Take 5 mLs by mouth 4 (four) times daily as needed.  200  mL  0  . aspirin EC 325 MG tablet Take 325 mg by mouth daily.        . citalopram (CELEXA) 40 MG tablet Take 40 mg by mouth daily.      Marland Kitchen dexamethasone (DECADRON) 4 MG tablet Take 4 mg by mouth 2 (two) times daily with a meal. Take 1 tablet twice a day the day before, the day of and the day after chemotherapy.       . diltiazem (CARDIZEM CD) 240 MG 24 hr capsule Take 1 capsule (240 mg total) by mouth daily.  30 capsule  6  . Fluticasone-Salmeterol (ADVAIR) 250-50 MCG/DOSE AEPB Inhale 1 puff into the lungs every 12 (twelve) hours.      . folic acid (FOLVITE) 1 MG tablet Take 1 mg by mouth daily.      Marland Kitchen gemfibrozil (LOPID)  600 MG tablet Take 600 mg by mouth daily.       Marland Kitchen guaiFENesin (MUCINEX) 600 MG 12 hr tablet Take 600 mg by mouth daily as needed. For congestion      . HYDROcodone-acetaminophen (VICODIN ES) 7.5-750 MG per tablet Take 1 tablet by mouth every 6 (six) hours as needed. For severe pain      . loratadine (CLARITIN) 10 MG tablet Take 1 tablet (10 mg total) by mouth daily.  30 tablet  3  . losartan (COZAAR) 50 MG tablet Take 1 tablet (50 mg total) by mouth 2 (two) times daily.  60 tablet  6  . metFORMIN (GLUCOPHAGE) 500 MG tablet Take 500 mg by mouth 2 (two) times daily with a meal.       . NON FORMULARY Oxygen 2L prn      . nystatin (MYCOSTATIN) 100000 UNIT/ML suspension Take 5 mLs (500,000 Units total) by mouth 4 (four) times daily.  180 mL  0  . omeprazole (PRILOSEC) 20 MG capsule Take 20 mg by mouth daily.       . pantoprazole (PROTONIX) 40 MG tablet Take 1 tablet (40 mg total) by mouth daily.  30 tablet  1  . polyethylene glycol (MIRALAX / GLYCOLAX) packet Take 17 g by mouth daily as needed. For constipation      . potassium chloride SA (K-DUR,KLOR-CON) 20 MEQ tablet Take 1 tablet (20 mEq total) by mouth 2 (two) times daily.  60 tablet  6  . prochlorperazine (COMPAZINE) 10 MG tablet Take 10 mg by mouth every 6 (six) hours as needed. For nausea      . senna (SENOKOT) 8.6 MG TABS Take 1 tablet by mouth daily as needed. For constipation      . sucralfate (CARAFATE) 1 G tablet Take 1 tablet (1 g total) by mouth 4 (four) times daily. Dissolve in 15 ml water.  90 tablet  1  . traMADol (ULTRAM) 50 MG tablet Take 50 mg by mouth every 4 (four) hours as needed. For moderate pain      . triamcinolone ointment (KENALOG) 0.1 % Apply topically 2 (two) times daily.      Marland Kitchen triamterene-hydrochlorothiazide (MAXZIDE-25) 37.5-25 MG per tablet Take 1 tablet by mouth daily.        Results for orders placed in visit on 08/04/11 (from the past 48 hour(s))  CBC WITH DIFFERENTIAL     Status: Abnormal   Collection Time    08/04/11 12:40 PM      Component Value Range Comment   WBC 0.4 (*) 3.9 - 10.3 (10e3/uL)    NEUT# 0.1 (*) 1.5 - 6.5 (  10e3/uL)    HGB 7.3 (*) 11.6 - 15.9 (g/dL)    HCT 16.1 (*) 09.6 - 46.6 (%)    Platelets 3 (*) 145 - 400 (10e3/uL)    MCV 78.6 (*) 79.5 - 101.0 (fL)    MCH 26.1  25.1 - 34.0 (pg)    MCHC 33.2  31.5 - 36.0 (g/dL)    RBC 0.45 (*) 4.09 - 5.45 (10e6/uL)    RDW 15.3 (*) 11.2 - 14.5 (%)    lymph# 0.2 (*) 0.9 - 3.3 (10e3/uL)    MONO# 0.1  0.1 - 0.9 (10e3/uL)    Eosinophils Absolute 0.0  0.0 - 0.5 (10e3/uL)    Basophils Absolute 0.0  0.0 - 0.1 (10e3/uL)    NEUT% 11.6 (*) 38.4 - 76.8 (%)    LYMPH% 53.5 (*) 14.0 - 49.7 (%)    MONO% 32.6 (*) 0.0 - 14.0 (%)    EOS% 2.3  0.0 - 7.0 (%)    BASO% 0.0  0.0 - 2.0 (%)    nRBC 0  0 - 0 (%)    No results found.    There were no vitals taken for this visit. General appearance: alert, cooperative, appears stated age and no distress Head: Normocephalic, without obvious abnormality, atraumatic Neck: no adenopathy, no carotid bruit, no JVD, supple, symmetrical, trachea midline and thyroid not enlarged, symmetric, no tenderness/mass/nodules Lymph nodes: Cervical, supraclavicular, and axillary nodes normal. Resp: clear to auscultation bilaterally Cardio: regular rate and rhythm, S1, S2 normal, no murmur, click, rub or gallop GI: soft, non-tender; bowel sounds normal; no masses,  no organomegaly Extremities: extremities normal, atraumatic, no cyanosis or edema Neurologic: Alert and oriented X 3, normal strength and tone. Normal symmetric reflexes. Normal coordination and gait   Assessment/Plan The patient is a pleasant 66 year old white female with stage IIB/IIIa non-small cell lung cancer adenocarcinoma diagnosed in December 2012. She presented for routine weekly labs test and was found to have chemotherapy induced pancytopenia with a total white count of 0.4, ANC of 0.1, hemoglobin of 7.3, and platelets of 3. Patient was also seen by and  discussed with Dr. Arbutus Ped. We will admit the patient to the inpatient oncology service. She will be empirically covered with cefepime 2 g IV every 12 hours. She'll be transfused 2 units packed red blood cells and 2 units of platelets and we will initiate Neupogen 480 mcg subcutaneously daily. She will also be placed on IV fluids and neutropenic diet and neutropenic precautions.  Tiana Loft E PA-C 08/04/2011, 2:17 PM  Hematology/oncology attending: The patient is seen and examined. I agree the above note. She has a stage IIIA non-small cell lung cancer and currently on treatment with carboplatin and Alimta status post 3 cycles. The patient presented for her routine weekly labs and was complaining of increasing fatigue and weakness as well as shortness of throat. CBC today showed significant pancytopenia. The patient will be admitted for evaluation and management of her severe neutropenia, thrombocytopenia and anemia.

## 2011-08-04 NOTE — Progress Notes (Signed)
[name] presented today for a routine blood work. She was complaining of sore throat and generalized fatigue. CBC today showed total white blood count 0.4 with absolute neutrophil count of 100, hemoglobin was down to 7.3 and platelets count of 3000. The patient will be admitted to [facility] for treatment of pancytopenia. He sees her hospital admission for more details.

## 2011-08-05 DIAGNOSIS — D709 Neutropenia, unspecified: Secondary | ICD-10-CM | POA: Diagnosis not present

## 2011-08-05 DIAGNOSIS — D696 Thrombocytopenia, unspecified: Secondary | ICD-10-CM | POA: Diagnosis not present

## 2011-08-05 DIAGNOSIS — C3490 Malignant neoplasm of unspecified part of unspecified bronchus or lung: Secondary | ICD-10-CM | POA: Diagnosis not present

## 2011-08-05 LAB — COMPREHENSIVE METABOLIC PANEL
ALT: 23 U/L (ref 0–35)
AST: 22 U/L (ref 0–37)
Alkaline Phosphatase: 90 U/L (ref 39–117)
CO2: 27 mEq/L (ref 19–32)
Calcium: 9 mg/dL (ref 8.4–10.5)
GFR calc Af Amer: 42 mL/min — ABNORMAL LOW (ref >90)
GFR calc non Af Amer: 36 mL/min — ABNORMAL LOW (ref >90)
Glucose, Bld: 111 mg/dL — ABNORMAL HIGH (ref 70–99)
Potassium: 3.4 mEq/L — ABNORMAL LOW (ref 3.5–5.1)
Sodium: 137 mEq/L (ref 135–145)
Total Protein: 6.6 g/dL (ref 6.0–8.3)

## 2011-08-05 LAB — PREPARE PLATELET PHERESIS: Unit division: 0

## 2011-08-05 LAB — DIFFERENTIAL
Basophils Absolute: 0 10*3/uL (ref 0.0–0.1)
Basophils Relative: 0 % (ref 0–1)
Eosinophils Absolute: 0 10*3/uL (ref 0.0–0.7)
Monocytes Absolute: 0.1 10*3/uL (ref 0.1–1.0)
Neutro Abs: 0.4 10*3/uL — ABNORMAL LOW (ref 1.7–7.7)

## 2011-08-05 LAB — CBC
HCT: 26.6 % — ABNORMAL LOW (ref 36.0–46.0)
Hemoglobin: 9.2 g/dL — ABNORMAL LOW (ref 12.0–15.0)
MCH: 28.5 pg (ref 26.0–34.0)
MCHC: 34.6 g/dL (ref 30.0–36.0)
RDW: 16 % — ABNORMAL HIGH (ref 11.5–15.5)

## 2011-08-05 LAB — GLUCOSE, CAPILLARY: Glucose-Capillary: 92 mg/dL (ref 70–99)

## 2011-08-05 MED ORDER — HEPARIN SOD (PORK) LOCK FLUSH 100 UNIT/ML IV SOLN: 500.0000 [IU] | Freq: Every day | INTRAVENOUS | Status: DC | PRN

## 2011-08-05 MED ORDER — SODIUM CHLORIDE 0.9 % IV SOLN: 250.0000 mL | Freq: Once | INTRAVENOUS | Status: DC

## 2011-08-05 MED ORDER — SODIUM CHLORIDE 0.9 % IJ SOLN: 10.0000 mL | INTRAMUSCULAR | Status: DC | PRN

## 2011-08-05 MED ORDER — SODIUM CHLORIDE 0.9 % IJ SOLN: 3.0000 mL | INTRAMUSCULAR | Status: DC | PRN

## 2011-08-05 MED ORDER — DIPHENHYDRAMINE HCL 50 MG/ML IJ SOLN: 25.0000 mg | Freq: Once | INTRAMUSCULAR | Status: DC

## 2011-08-05 MED ORDER — ACETAMINOPHEN 325 MG PO TABS: 650.0000 mg | ORAL_TABLET | Freq: Once | ORAL | Status: DC

## 2011-08-05 MED ORDER — HEPARIN SOD (PORK) LOCK FLUSH 100 UNIT/ML IV SOLN: 250.0000 [IU] | INTRAVENOUS | Status: DC | PRN

## 2011-08-05 MED ORDER — DIPHENHYDRAMINE HCL 50 MG/ML IJ SOLN
25.0000 mg | Freq: Once | INTRAMUSCULAR | Status: AC
  Administered 2011-08-06: 25 mg via INTRAVENOUS
  Filled 2011-08-05: qty 1

## 2011-08-05 MED ORDER — ACETAMINOPHEN 325 MG PO TABS
650.0000 mg | ORAL_TABLET | Freq: Once | ORAL | Status: AC
  Administered 2011-08-06: 650 mg via ORAL
  Filled 2011-08-05: qty 2

## 2011-08-05 NOTE — Progress Notes (Signed)
CRITICAL VALUE ALERT  Critical value received:  WBC 0.7, Platlets <5  Date of notification: 08/05/2011  Time of notification:  0845  Critical value read back: yes  Nurse who received alert:  Harrietta Guardian RN  MD notified (1st page):  Dr. Gwenyth Bouillon on floor  Time of first page: 0845  MD notified (2nd page):  Time of second page:  Responding MD:  Dr. Gwenyth Bouillon  Time MD responded:  [phone]

## 2011-08-05 NOTE — Progress Notes (Signed)
Subjective: The patient is seen and examined today. She is feeling fine with no specific complaints except for mild fatigue. She denied having any fever or chills last night. She has no significant bleeding issues. She received 2 units of red blood cell transfusion as well as 2 units of platelets yesterday.  Objective: Vital signs in last 24 hours: Temp:  [97.8 F (36.6 C)-99.9 F (37.7 C)] 99.1 F (37.3 C) (04/25 0840) Pulse Rate:  [73-97] 97  (04/25 0840) Resp:  [14-20] 20  (04/25 0840) BP: (90-132)/(45-68) 122/62 mmHg (04/25 0840) SpO2:  [97 %-100 %] 97 % (04/25 0532) FiO2 (%):  [28 %] 28 % (04/25 0342) Weight:  [170 lb 3.2 oz (77.202 kg)] 170 lb 3.2 oz (77.202 kg) (04/24 1437)  Intake/Output from previous day: 04/24 0701 - 04/25 0700 In: 1736 [P.O.:480; I.V.:60; Blood:1196] Out: -  Intake/Output this shift:    General appearance: alert, cooperative and no distress Resp: clear to auscultation bilaterally Cardio: regular rate and rhythm, S1, S2 normal, no murmur, click, rub or gallop GI: soft, non-tender; bowel sounds normal; no masses,  no organomegaly Extremities: extremities normal, atraumatic, no cyanosis or edema  Lab Results:   Basename 08/05/11 0804 08/04/11 1240  WBC 0.7* 0.4*  HGB 9.2* 7.3*  HCT 26.6* 22.0*  PLT <5* 3*   BMET  Basename 08/05/11 0804 08/04/11 1240  NA 137 136  K 3.4* 3.7  CL 99 99  CO2 27 26  GLUCOSE 111* 119*  BUN 22 19  CREATININE 1.46* 1.41*  CALCIUM 9.0 8.6    Studies/Results: No results found.  Medications: I have reviewed the patient's current medications.   Assessment/Plan: This is a very pleasant 66 years old white female with history of stage IIIa non-small cell lung cancer currently undergoing systemic chemotherapy with carboplatin and Alimta status post 3 cycles. The patient was admitted yesterday with severe pancytopenia. Her anemia is better today after the packed rbc's transfusion but the patient continues to have severe  thrombocytopenia and neutropenia. I will arrange for her to receive 2 units of platelets transfusion today. She will continue on Neupogen 480 mcg subcutaneously on daily basis until ANC over 1000. She would also continue with empiric coverage with Avelox. I will continue to check her CBC and comprehensive metabolic panel on daily basis.  LOS: 1 day    [name] K. 08/05/2011

## 2011-08-06 ENCOUNTER — Ambulatory Visit: Payer: Medicare Other | Admitting: Emergency Medicine

## 2011-08-06 DIAGNOSIS — D696 Thrombocytopenia, unspecified: Secondary | ICD-10-CM | POA: Diagnosis not present

## 2011-08-06 DIAGNOSIS — E876 Hypokalemia: Secondary | ICD-10-CM | POA: Diagnosis not present

## 2011-08-06 DIAGNOSIS — D649 Anemia, unspecified: Secondary | ICD-10-CM | POA: Diagnosis not present

## 2011-08-06 DIAGNOSIS — C3490 Malignant neoplasm of unspecified part of unspecified bronchus or lung: Secondary | ICD-10-CM | POA: Diagnosis not present

## 2011-08-06 LAB — COMPREHENSIVE METABOLIC PANEL
ALT: 16 U/L (ref 0–35)
AST: 16 U/L (ref 0–37)
CO2: 25 mEq/L (ref 19–32)
Calcium: 8.3 mg/dL — ABNORMAL LOW (ref 8.4–10.5)
Chloride: 101 mEq/L (ref 96–112)
GFR calc non Af Amer: 44 mL/min — ABNORMAL LOW (ref >90)
Potassium: 2.9 mEq/L — ABNORMAL LOW (ref 3.5–5.1)
Sodium: 138 mEq/L (ref 135–145)

## 2011-08-06 LAB — DIC (DISSEMINATED INTRAVASCULAR COAGULATION)PANEL
D-Dimer, Quant: 2.57 ug/mL-FEU — ABNORMAL HIGH (ref 0.00–0.48)
INR: 1.01 (ref 0.00–1.49)
Platelets: 7 10*3/uL — CL (ref 150–400)

## 2011-08-06 LAB — DIFFERENTIAL
Eosinophils Relative: 1 % (ref 0–5)
Lymphocytes Relative: 16 % (ref 12–46)
Monocytes Absolute: 0.4 10*3/uL (ref 0.1–1.0)
Monocytes Relative: 15 % — ABNORMAL HIGH (ref 3–12)
Neutrophils Relative %: 68 % (ref 43–77)
WBC Morphology: INCREASED

## 2011-08-06 LAB — DIRECT ANTIGLOBULIN TEST (NOT AT ARMC): DAT, IgG: NEGATIVE

## 2011-08-06 LAB — CBC
Platelets: 6 10*3/uL — CL (ref 150–400)
RBC: 2.71 MIL/uL — ABNORMAL LOW (ref 3.87–5.11)
WBC: 2.6 10*3/uL — ABNORMAL LOW (ref 4.0–10.5)

## 2011-08-06 MED ORDER — DIPHENHYDRAMINE HCL 50 MG/ML IJ SOLN
INTRAMUSCULAR | Status: AC
  Administered 2011-08-06: 25 mg
  Filled 2011-08-06: qty 1

## 2011-08-06 MED ORDER — POTASSIUM CHLORIDE 10 MEQ/100ML IV SOLN
10.0000 meq | INTRAVENOUS | Status: AC
  Administered 2011-08-06 – 2011-08-07 (×4): 10 meq via INTRAVENOUS
  Filled 2011-08-06 (×4): qty 100

## 2011-08-06 MED ORDER — DIPHENHYDRAMINE HCL 50 MG/ML IJ SOLN
25.0000 mg | Freq: Once | INTRAMUSCULAR | Status: AC
  Administered 2011-08-06: 25 mg via INTRAVENOUS

## 2011-08-06 MED ORDER — ACETAMINOPHEN 325 MG PO TABS
ORAL_TABLET | ORAL | Status: AC
  Filled 2011-08-06: qty 2

## 2011-08-06 MED ORDER — ACETAMINOPHEN 325 MG PO TABS
650.0000 mg | ORAL_TABLET | Freq: Once | ORAL | Status: AC
  Administered 2011-08-06: 650 mg via ORAL

## 2011-08-06 MED ORDER — ACETAMINOPHEN 325 MG PO TABS
ORAL_TABLET | ORAL | Status: AC
  Administered 2011-08-06: 650 mg
  Filled 2011-08-06: qty 2

## 2011-08-06 MED ORDER — DIPHENHYDRAMINE HCL 50 MG/ML IJ SOLN
INTRAMUSCULAR | Status: AC
  Filled 2011-08-06: qty 1

## 2011-08-06 NOTE — Progress Notes (Signed)
Patient ID: [name], female   DOB: Jul 05, 1945, 66 y.o.   MRN: [id number] Subjective: Generally feeling a little weak today. She complains of mild back pain. Reports cough productive of blood tinged sputum.  Objective: Vital signs in last 24 hours: Temp:  [98.1 F (36.7 C)-99.3 F (37.4 C)] 99.1 F (37.3 C) (04/26 0500) Pulse Rate:  [81-103] 84  (04/26 0500) Resp:  [18-20] 20  (04/26 0500) BP: (100-148)/(40-61) 107/40 mmHg (04/26 0500) SpO2:  [96 %-98 %] 96 % (04/26 0343)  Intake/Output from previous day: 04/25 0701 - 04/26 0700 In: 2557 [P.O.:1180; I.V.:724.5; Blood:402.5; IV Piggyback:250] Out: -  Intake/Output this shift:    General appearance: alert, cooperative, appears stated age and no distress Resp: clear to auscultation bilaterally Cardio: regular rate and rhythm, S1, S2 normal, no murmur, click, rub or gallop GI: soft, non-tender; bowel sounds normal; no masses,  no organomegaly Extremities: extremities normal, atraumatic, no cyanosis or edema  Lab Results:   Basename 08/06/11 0740 08/05/11 0804  WBC 2.6* 0.7*  HGB 7.7* 9.2*  HCT 22.4* 26.6*  PLT 6* <5*   BMET  Basename 08/06/11 0740 08/05/11 0804  NA 138 137  K 2.9* 3.4*  CL 101 99  CO2 25 27  GLUCOSE 88 111*  BUN 16 22  CREATININE 1.23* 1.46*  CALCIUM 8.3* 9.0    Studies/Results: No results found.  Medications: I have reviewed the patient's current medications.  Assessment/Plan: Pleasant 66 year old female with stage IIB/IIIA non-small cell lung cancer adenocarcinoma, admitted with chemotherapy induced pancytopenia. The neutropenia has resolved. We will discontinue the Neupogen after today's dose. She remains thrombocytopenic and is again anemic, raising concerns for hemolysis. We will further investigate this with DIC, DAT, haptoglobin and LDH. She will be transfused another 2 units of packed red blood cells for her hgb of 7.7 and another 2 units of platelets for her plt count of 6. She is  hypokalemic and we will repleat this with 4 runs of KCL 10 meq.   LOS: 2 days    [name] 08/06/2011 9:27 AM  Hematology/Oncology Attending: [name] is seen and examined. I agree with the above note. She is feeling well today but still has fatigue with exertion. ANC is improving, will D/C Neupogen tomorrow. Hb and platelets still low. We will transfuse 2 units of PRBCs and 2 units of platelets today. We will check DIC panel, LDH, Haptoglobin and DAT to rule out our etiology for the anemia and thrombocytopenia.

## 2011-08-06 NOTE — Progress Notes (Signed)
Patient with Signed and held orders for 4/25 for [facility]. Orders listed to transfuse 2 unit platelets and MD had left in note plan to transfuse platelets. Paged MD on call. Orders placed to transfuse 2 units of platelets with pre meds when available.  2400 Blood bank has 1 unit available. To get another from [facility] later tonight. Will wait to start when both available since pre meds ordered.

## 2011-08-07 DIAGNOSIS — D61818 Other pancytopenia: Secondary | ICD-10-CM | POA: Diagnosis not present

## 2011-08-07 DIAGNOSIS — R0989 Other specified symptoms and signs involving the circulatory and respiratory systems: Secondary | ICD-10-CM | POA: Diagnosis not present

## 2011-08-07 DIAGNOSIS — C3490 Malignant neoplasm of unspecified part of unspecified bronchus or lung: Secondary | ICD-10-CM | POA: Diagnosis not present

## 2011-08-07 LAB — GLUCOSE, CAPILLARY
Glucose-Capillary: 74 mg/dL (ref 70–99)
Glucose-Capillary: 94 mg/dL (ref 70–99)
Glucose-Capillary: 95 mg/dL (ref 70–99)

## 2011-08-07 LAB — DIFFERENTIAL
Eosinophils Relative: 0 % (ref 0–5)
Lymphs Abs: 0.7 10*3/uL (ref 0.7–4.0)
Monocytes Relative: 9 % (ref 3–12)
Neutro Abs: 7.9 10*3/uL — ABNORMAL HIGH (ref 1.7–7.7)

## 2011-08-07 LAB — CBC
HCT: 32 % — ABNORMAL LOW (ref 36.0–46.0)
Hemoglobin: 10.8 g/dL — ABNORMAL LOW (ref 12.0–15.0)
MCV: 85.8 fL (ref 78.0–100.0)
RBC: 3.73 MIL/uL — ABNORMAL LOW (ref 3.87–5.11)
WBC: 9.4 10*3/uL (ref 4.0–10.5)

## 2011-08-07 MED ORDER — FUROSEMIDE 10 MG/ML IJ SOLN
20.0000 mg | Freq: Once | INTRAMUSCULAR | Status: AC
  Administered 2011-08-07: 20 mg via INTRAVENOUS
  Filled 2011-08-07: qty 2

## 2011-08-07 NOTE — Progress Notes (Signed)
Patient c/o feeling increased SOB when ambulating this am. Patient had 2 units of blood and 2 bags of platelets in last 24 hours (1 bag of platelets had total volume of 590 ml) plus 4 KCL "runs". KVO fluids and spoke to Dr Welton Flakes. Orders received.

## 2011-08-07 NOTE — Progress Notes (Signed)
Subjective: Patient complains of congestion this morning and dry cough. She received 2 units of PRBC and 1 unit platelets. She has no pain. She is slightly puffy appearing. She does tell me that she has been urinating well.  Objective:  Most recent Vital signs: Blood pressure 118/62, pulse 80, temperature 98 F (36.7 C), temperature source Oral, resp. rate 20, height 5\' 6"  (1.676 m), weight 170 lb 3.2 oz (77.202 kg), SpO2 90.00%. 5\' 6"  (1.676 m)    Body surface area is 1.90 meters squared.  Intake/Output from previous day: 04/26 0701 - 04/27 0700 In: 4750.1 [P.O.:580; I.V.:1862.5; Blood:1857.7; IV Piggyback:450] Out: 2551 [Urine:2550; Stool:1]  Physical Exam: Awake and alert, NAD HEENT: EOMI, PERRLA, sclera anicteric LUNGS: rales bilateral with wheezes CVS; RRR ABD; NT. ND BS+,  EXT: +1 edema  Lab Results:   Basename 08/07/11 0400 08/06/11 1001 08/06/11 0740  WBC 9.4 -- 2.6*  HGB 10.8* -- 7.7*  HCT 32.0* -- 22.4*  PLT 19* 7* --   BMET:  Basename 08/06/11 0740 08/05/11 0804  NA 138 137  K 2.9* 3.4*  CL 101 99  CO2 25 27  GLUCOSE 88 111*  BUN 16 22  CREATININE 1.23* 1.46*  CALCIUM 8.3* 9.0   CMP:     Component Value Date/Time   NA 138 08/06/2011 0740   K 2.9* 08/06/2011 0740   CL 101 08/06/2011 0740   CO2 25 08/06/2011 0740   GLUCOSE 88 08/06/2011 0740   BUN 16 08/06/2011 0740   CREATININE 1.23* 08/06/2011 0740   CALCIUM 8.3* 08/06/2011 0740   PROT 5.9* 08/06/2011 0740   ALBUMIN 2.9* 08/06/2011 0740   AST 16 08/06/2011 0740   ALT 16 08/06/2011 0740   ALKPHOS 73 08/06/2011 0740   BILITOT 0.2* 08/06/2011 0740   GFRNONAA 44* 08/06/2011 0740   GFRAA 51* 08/06/2011 0740   Micro: Results for orders placed in visit on 07/21/11  TECHNOLOGIST REVIEW     Status: Normal   Collection Time   07/21/11 11:07 AM      Component Value Range Status Comment   Technologist Review Few Metas and Myelocytes present   Final       Medications: I have reviewed the patient's current  medications.  Assessment/Plan: 66 y.o. female with  1. Stage IIB/IIIA NSCLC ( adenocarcinoma) s/p chemotherapy with pancytopenia, now slowly improving. She still remains anemic and thrombocytopenic. S/P PRBC and Platelets  2. Lung Congestion: possibly fluid overload to above, give lasix today. If no improvement then would consider chest x-ray.     LOS: 3 days   Drue Second, MD Medical/Oncology [facility] [phone] (beeper) [phone] (Office)  08/07/2011, 11:35 AM

## 2011-08-08 ENCOUNTER — Inpatient Hospital Stay (HOSPITAL_COMMUNITY): Payer: Medicare HMO

## 2011-08-08 ENCOUNTER — Other Ambulatory Visit: Payer: Self-pay | Admitting: Oncology

## 2011-08-08 DIAGNOSIS — C3490 Malignant neoplasm of unspecified part of unspecified bronchus or lung: Secondary | ICD-10-CM | POA: Diagnosis not present

## 2011-08-08 DIAGNOSIS — R0602 Shortness of breath: Secondary | ICD-10-CM | POA: Diagnosis not present

## 2011-08-08 LAB — TYPE AND SCREEN
Antibody Screen: NEGATIVE
Unit division: 0
Unit division: 0
Unit division: 0

## 2011-08-08 LAB — DIFFERENTIAL
Eosinophils Relative: 0 % (ref 0–5)
Monocytes Absolute: 2.3 10*3/uL — ABNORMAL HIGH (ref 0.1–1.0)
Monocytes Relative: 14 % — ABNORMAL HIGH (ref 3–12)
Neutrophils Relative %: 82 % — ABNORMAL HIGH (ref 43–77)

## 2011-08-08 LAB — PREPARE PLATELET PHERESIS: Unit division: 0

## 2011-08-08 LAB — CBC
MCH: 29.2 pg (ref 26.0–34.0)
Platelets: 20 10*3/uL — CL (ref 150–400)
RBC: 3.87 MIL/uL (ref 3.87–5.11)
WBC: 16.3 10*3/uL — ABNORMAL HIGH (ref 4.0–10.5)

## 2011-08-08 LAB — GLUCOSE, CAPILLARY: Glucose-Capillary: 95 mg/dL (ref 70–99)

## 2011-08-08 MED ORDER — SODIUM CHLORIDE 0.9 % IV SOLN
INTRAVENOUS | Status: DC
  Administered 2011-08-08 – 2011-08-10 (×3): via INTRAVENOUS
  Filled 2011-08-08 (×6): qty 1000

## 2011-08-08 MED ORDER — FUROSEMIDE 10 MG/ML IJ SOLN
20.0000 mg | Freq: Once | INTRAMUSCULAR | Status: AC
  Administered 2011-08-08: 20 mg via INTRAVENOUS
  Filled 2011-08-08: qty 2

## 2011-08-08 MED ORDER — LEVALBUTEROL HCL 0.63 MG/3ML IN NEBU
0.6300 mg | INHALATION_SOLUTION | Freq: Three times a day (TID) | RESPIRATORY_TRACT | Status: DC | PRN
  Administered 2011-08-08 – 2011-08-10 (×5): 0.63 mg via RESPIRATORY_TRACT
  Filled 2011-08-08 (×4): qty 3

## 2011-08-08 MED ORDER — CITALOPRAM HYDROBROMIDE 20 MG PO TABS
20.0000 mg | ORAL_TABLET | Freq: Every day | ORAL | Status: DC
  Administered 2011-08-09 – 2011-08-11 (×3): 20 mg via ORAL
  Filled 2011-08-08 (×3): qty 1

## 2011-08-08 NOTE — Progress Notes (Signed)
Subjective: 66 yo woman with 3B adenoca lung on active chemo. Feeling more winded, c/o feeling feverish. Also coughing blood tinged phlegm.denies pain.  Objective:  Most recent Vital signs: Blood pressure 167/62, pulse 95, temperature 99.4 F (37.4 C), temperature source Oral, resp. rate 20, height 5\' 6"  (1.676 m), weight 170 lb 3.2 oz (77.202 kg), SpO2 88.00%. 5\' 6"  (1.676 m)    Body surface area is 1.90 meters squared.  Vital signs in last 24 hours: Temp:  [99 F (37.2 C)-99.9 F (37.7 C)] 99.4 F (37.4 C) (04/28 0805) Pulse Rate:  [80-95] 95  (04/28 0421) Resp:  [20-22] 20  (04/28 0421) BP: (118-167)/(52-62) 167/62 mmHg (04/28 0421) SpO2:  [88 %-96 %] 88 % (04/28 0813)  Intake/Output from previous day: 04/27 0701 - 04/28 0700 In: 780.4 [P.O.:480; I.V.:300.4] Out: 1400 [Urine:1400]  Physical Exam: General appearance: alert, cooperative and appears stated age Eyes: conjunctivae/corneas clear. PERRL, EOM's intact. Fundi benign. Throat: lips, mucosa, and tongue normal; teeth and gums normal Resp: bibasilar rales with bronchial BS on left Cardio: regular rate and rhythm, S1, S2 normal, no murmur, click, rub or gallop GI: soft, non-tender; bowel sounds normal; no masses,  no organomegaly Extremities: extremities normal, atraumatic, no cyanosis or edema Pulses: 2+ and symmetric  Lab Results:   Basename 08/08/11 0406 08/07/11 0400  WBC 16.3* 9.4  HGB 11.3* 10.8*  HCT 33.3* 32.0*  PLT 20* 19*   BMET:  Basename 08/06/11 0740  NA 138  K 2.9*  CL 101  CO2 25  GLUCOSE 88  BUN 16  CREATININE 1.23*  CALCIUM 8.3*   CMP:     Component Value Date/Time   NA 138 08/06/2011 0740   K 2.9* 08/06/2011 0740   CL 101 08/06/2011 0740   CO2 25 08/06/2011 0740   GLUCOSE 88 08/06/2011 0740   BUN 16 08/06/2011 0740   CREATININE 1.23* 08/06/2011 0740   CALCIUM 8.3* 08/06/2011 0740   PROT 5.9* 08/06/2011 0740   ALBUMIN 2.9* 08/06/2011 0740   AST 16 08/06/2011 0740   ALT 16 08/06/2011 0740    ALKPHOS 73 08/06/2011 0740   BILITOT 0.2* 08/06/2011 0740   GFRNONAA 44* 08/06/2011 0740   GFRAA 51* 08/06/2011 0740   Micro: Results for orders placed in visit on 07/21/11  TECHNOLOGIST REVIEW     Status: Normal   Collection Time   07/21/11 11:07 AM      Component Value Range Status Comment   Technologist Review Few Metas and Myelocytes present   Final     Pathology:  n/a   Studies/Results: Dg Chest Port 1 View  08/08/2011  *RADIOLOGY REPORT*  Clinical Data: Shortness of breath  PORTABLE CHEST - 1 VIEW  Comparison: 07/04/2011  Findings: Right suprahilar mass is more prominent on today's examination.  In addition, there is increased interstitial prominence, right greater than left.  Status post left lower lobectomy in.  No pneumothorax.  No definite pleural effusion. Cardiac contour within normal limits.  No acute osseous abnormality identified.  IMPRESSION: Right suprahilar mass is more pronounced on today's examination. Increased interstitial prominence may reflect edema, atypical infection, or lymphangitic spread of disease.  Original Report Authenticated By: Waneta Martins, M.D.    Medications: I have reviewed the patient's current medications.  Assessment/Plan: 66 y.o. female with hx of 3b adenoca on chemotherapy. Admitted with fever, possible pna. On abics, s/p transfusions. Plts still low @20k , will continue to monitor. Her wbc has improved , while she has low grade temp,  this might be secondary to improving wbc.  She has having some blood tinged sputum which is likely to local irritation. Hx of pancytopenia secondary to chemo. Still having low gr temps. cxr remanis stable. Will check o2 , low threshold t0 check for PE. Will begin PAS.    LOS: 4 days   Pierce Crane, MD Medical/Oncology [facility] [phone] (beeper) [phone] (Office)  08/08/2011, 9:44 AM

## 2011-08-09 ENCOUNTER — Other Ambulatory Visit (HOSPITAL_COMMUNITY): Payer: Medicare Other

## 2011-08-09 ENCOUNTER — Inpatient Hospital Stay (HOSPITAL_COMMUNITY)
Admission: RE | Admit: 2011-08-09 | Discharge: 2011-08-09 | Payer: Medicare Other | Source: Ambulatory Visit | Attending: Internal Medicine | Admitting: Internal Medicine

## 2011-08-09 DIAGNOSIS — R5381 Other malaise: Secondary | ICD-10-CM

## 2011-08-09 DIAGNOSIS — D696 Thrombocytopenia, unspecified: Secondary | ICD-10-CM | POA: Diagnosis not present

## 2011-08-09 DIAGNOSIS — C3490 Malignant neoplasm of unspecified part of unspecified bronchus or lung: Secondary | ICD-10-CM | POA: Diagnosis not present

## 2011-08-09 LAB — CBC
MCHC: 33.7 g/dL (ref 30.0–36.0)
MCV: 85.8 fL (ref 78.0–100.0)
Platelets: 21 10*3/uL — CL (ref 150–400)
RDW: 16 % — ABNORMAL HIGH (ref 11.5–15.5)
WBC: 10.4 10*3/uL (ref 4.0–10.5)

## 2011-08-09 LAB — COMPREHENSIVE METABOLIC PANEL
Albumin: 3 g/dL — ABNORMAL LOW (ref 3.5–5.2)
BUN: 11 mg/dL (ref 6–23)
Calcium: 7.5 mg/dL — ABNORMAL LOW (ref 8.4–10.5)
Creatinine, Ser: 1.17 mg/dL — ABNORMAL HIGH (ref 0.50–1.10)
GFR calc Af Amer: 55 mL/min — ABNORMAL LOW (ref >90)
Total Protein: 6.2 g/dL (ref 6.0–8.3)

## 2011-08-09 LAB — DIFFERENTIAL
Basophils Absolute: 0 10*3/uL (ref 0.0–0.1)
Eosinophils Absolute: 0 10*3/uL (ref 0.0–0.7)
Lymphs Abs: 0.5 10*3/uL — ABNORMAL LOW (ref 0.7–4.0)
Monocytes Absolute: 1.2 10*3/uL — ABNORMAL HIGH (ref 0.1–1.0)
Monocytes Relative: 12 % (ref 3–12)

## 2011-08-09 LAB — GLUCOSE, CAPILLARY: Glucose-Capillary: 130 mg/dL — ABNORMAL HIGH (ref 70–99)

## 2011-08-09 MED ORDER — DIPHENHYDRAMINE HCL 25 MG PO CAPS
25.0000 mg | ORAL_CAPSULE | Freq: Four times a day (QID) | ORAL | Status: DC | PRN
  Administered 2011-08-09 – 2011-08-10 (×2): 25 mg via ORAL
  Filled 2011-08-09: qty 1

## 2011-08-09 MED ORDER — VITAMINS A & D EX OINT
TOPICAL_OINTMENT | CUTANEOUS | Status: AC
  Administered 2011-08-09: 22:00:00
  Filled 2011-08-09: qty 5

## 2011-08-09 NOTE — Progress Notes (Signed)
Patient ID: [name], female   DOB: 10-Aug-1945, 66 y.o.   MRN: [id number] Subjective: Patient reports feeling weak. She states she had a "smothering episode" last night when she was up to the bathroom. Had difficulty catching her breath. Nurses increased her oxygen and the episode resolved. Patient reports history of asthma, emphysema and COPD. Voiced no other complaints.  Objective: Vital signs in last 24 hours: Temp:  [98.5 F (36.9 C)-99.4 F (37.4 C)] 99.4 F (37.4 C) (04/29 0500) Pulse Rate:  [81-90] 90  (04/29 0500) Resp:  [20] 20  (04/29 0500) BP: (111-150)/(50-55) 150/55 mmHg (04/29 0500) SpO2:  [89 %-93 %] 93 % (04/29 0500)  Intake/Output from previous day: 04/28 0701 - 04/29 0700 In: 385.1 [I.V.:385.1] Out: 450 [Urine:450] Intake/Output this shift: Total I/O In: -  Out: 300 [Urine:300]  General appearance: alert, cooperative, appears stated age and no distress Resp: clear to auscultation bilaterally Cardio: regular rate and rhythm, S1, S2 normal, no murmur, click, rub or gallop GI: soft, non-tender; bowel sounds normal; no masses,  no organomegaly Extremities: extremities normal, atraumatic, no cyanosis or edema  Lab Results:   Basename 08/09/11 0345 08/08/11 0406  WBC 10.4 16.3*  HGB 11.0* 11.3*  HCT 32.6* 33.3*  PLT 21* 20*   BMET  Basename 08/09/11 0345  NA 139  K 3.3*  CL 101  CO2 26  GLUCOSE 102*  BUN 11  CREATININE 1.17*  CALCIUM 7.5*    Studies/Results: Dg Chest Port 1 View  08/08/2011  *RADIOLOGY REPORT*  Clinical Data: Shortness of breath  PORTABLE CHEST - 1 VIEW  Comparison: 07/04/2011  Findings: Right suprahilar mass is more prominent on today's examination.  In addition, there is increased interstitial prominence, right greater than left.  Status post left lower lobectomy in.  No pneumothorax.  No definite pleural effusion. Cardiac contour within normal limits.  No acute osseous abnormality identified.  IMPRESSION: Right suprahilar mass  is more pronounced on today's examination. Increased interstitial prominence may reflect edema, atypical infection, or lymphangitic spread of disease.  Original Report Authenticated By: Waneta Martins, M.D.    Medications: I have reviewed the patient's current medications.  Assessment/Plan: Pleasant 66 year old white female with stage IIIA non-small cell lung cancer status post 3 cycles systemic chemotherapy with carboplatin and Alimta. She was admitted with severe chemotherapy induced pancytopenia. The anemia and neutropenia have resolve. She remains thrombocytopenic without active bleeding or bruising. The thrombocytopenia is improving slowly. Regarding respiratory episode, CXR suggests edema. Will decrease IVF to 25 ml/hour. As her neutropenia has resolved, we will discontinue the avelox.   LOS: 5 days    [name] 08/09/2011 9:09 AM  Hematology/oncology attending:  The patient is seen and examined today. I agree with the above note. She still feeling weak with chest congestion. This could be a result of volume overload. I will decrease her IV fluids rate. We will monitor her overnight after considering discharging her home tomorrow if medically stable.

## 2011-08-10 DIAGNOSIS — D696 Thrombocytopenia, unspecified: Secondary | ICD-10-CM | POA: Diagnosis not present

## 2011-08-10 DIAGNOSIS — C3490 Malignant neoplasm of unspecified part of unspecified bronchus or lung: Secondary | ICD-10-CM | POA: Diagnosis not present

## 2011-08-10 LAB — DIFFERENTIAL
Basophils Absolute: 0 10*3/uL (ref 0.0–0.1)
Eosinophils Relative: 1 % (ref 0–5)
Monocytes Absolute: 1.2 10*3/uL — ABNORMAL HIGH (ref 0.1–1.0)
Monocytes Relative: 18 % — ABNORMAL HIGH (ref 3–12)
Neutrophils Relative %: 75 % (ref 43–77)

## 2011-08-10 LAB — GLUCOSE, CAPILLARY
Glucose-Capillary: 84 mg/dL (ref 70–99)
Glucose-Capillary: 90 mg/dL (ref 70–99)

## 2011-08-10 LAB — CBC
Platelets: 24 10*3/uL — CL (ref 150–400)
RBC: 3.77 MIL/uL — ABNORMAL LOW (ref 3.87–5.11)
RDW: 15.8 % — ABNORMAL HIGH (ref 11.5–15.5)
WBC: 6.8 10*3/uL (ref 4.0–10.5)

## 2011-08-10 MED ORDER — FUROSEMIDE 10 MG/ML IJ SOLN
40.0000 mg | Freq: Once | INTRAMUSCULAR | Status: AC
  Administered 2011-08-10: 40 mg via INTRAVENOUS
  Filled 2011-08-10: qty 4

## 2011-08-10 NOTE — Progress Notes (Signed)
Patient ID: [name], female   DOB: [birth year], 66 y.o.   MRN: [id number] Subjective: Complains of feeling weak. Had another "smothering spell" last night when getting up to go to the bathroom. Given neb treatment and humidification on her oxygen with resolution. Reports cough productive of bloody secretions. Patient still thrombocytopenic.  Objective: Vital signs in last 24 hours: Temp:  [97.9 F (36.6 C)-99.7 F (37.6 C)] 99.7 F (37.6 C) (04/30 0510) Pulse Rate:  [86-98] 86  (04/30 0730) Resp:  [20] 20  (04/30 0510) BP: (130-152)/(36-78) 142/40 mmHg (04/30 0510) SpO2:  [90 %-94 %] 94 % (04/30 0730)  Intake/Output from previous day: 04/29 0701 - 04/30 0700 In: 3135 [P.O.:1065; I.V.:2070] Out: 2252 [Urine:2250; Stool:2] Intake/Output this shift:    General appearance: alert, cooperative, appears stated age and no distress Resp: rales generalized crackles Cardio: regular rate and rhythm, S1, S2 normal, no murmur, click, rub or gallop GI: soft, non-tender; bowel sounds normal; no masses,  no organomegaly Extremities: extremities normal, atraumatic, no cyanosis or edema  Lab Results:   Basename 08/10/11 0350 08/09/11 0345  WBC 6.8 10.4  HGB 10.9* 11.0*  HCT 32.0* 32.6*  PLT 24* 21*   BMET  Basename 08/09/11 0345  NA 139  K 3.3*  CL 101  CO2 26  GLUCOSE 102*  BUN 11  CREATININE 1.17*  CALCIUM 7.5*    Studies/Results: No results found.  Medications: I have reviewed the patient's current medications.  Assessment/Plan: Pleasant 66 year old white female with stage IIIA non-small cell lung cancer, status post 3 cycles systemic chemotherapy with carboplatin and Alimta. Admitted with severe chemotherapy induced pancytopenia. The anemia and neutropenia have resolved and remain stable. The thrombocytopenia is improving. The bloody secretions are likely secondary to the thrombocytopenia. Will closely monitor this. Her respiratory issues likely secondary to volume  overload. Will decrease IVF to [facility] and give IV Lasix for diuresis. If improved symptomatically and stable will consider discharge home later today or in the morning.   LOS: 6 days    [name] 08/10/2011 9:05 AM  Hematology/oncology attending:  The patient is seen and examined. I agree with the above note. She had episodes of dyspnea last night. Her physical exam showed bilateral crackles suspicious for pulmonary edema. Will start the patient on IV diuretics with Lasix. We will monitor her today. She is expected to go home tomorrow if she is stable.

## 2011-08-10 NOTE — Progress Notes (Signed)
Patient with SOB after going to bathroom. Sats in low 90's on 4l [place]. Patient with productive cough with dark red phlegm. Given neb with decreased in SOB. Patient had refused advair at hs.

## 2011-08-11 ENCOUNTER — Other Ambulatory Visit: Payer: Self-pay | Admitting: Physician Assistant

## 2011-08-11 ENCOUNTER — Ambulatory Visit: Payer: Medicare Other

## 2011-08-11 ENCOUNTER — Telehealth: Payer: Self-pay | Admitting: Internal Medicine

## 2011-08-11 ENCOUNTER — Ambulatory Visit: Payer: Medicare Other | Admitting: Internal Medicine

## 2011-08-11 ENCOUNTER — Telehealth: Payer: Self-pay | Admitting: Hematology and Oncology

## 2011-08-11 ENCOUNTER — Other Ambulatory Visit: Payer: Medicare Other

## 2011-08-11 DIAGNOSIS — D696 Thrombocytopenia, unspecified: Secondary | ICD-10-CM | POA: Diagnosis not present

## 2011-08-11 DIAGNOSIS — C3490 Malignant neoplasm of unspecified part of unspecified bronchus or lung: Secondary | ICD-10-CM | POA: Diagnosis not present

## 2011-08-11 DIAGNOSIS — R0989 Other specified symptoms and signs involving the circulatory and respiratory systems: Secondary | ICD-10-CM | POA: Diagnosis not present

## 2011-08-11 DIAGNOSIS — C3410 Malignant neoplasm of upper lobe, unspecified bronchus or lung: Secondary | ICD-10-CM

## 2011-08-11 LAB — CBC
Hemoglobin: 10.4 g/dL — ABNORMAL LOW (ref 12.0–15.0)
MCH: 28.5 pg (ref 26.0–34.0)
MCHC: 33.9 g/dL (ref 30.0–36.0)
Platelets: 36 10*3/uL — ABNORMAL LOW (ref 150–400)
RBC: 3.65 MIL/uL — ABNORMAL LOW (ref 3.87–5.11)

## 2011-08-11 LAB — DIFFERENTIAL
Basophils Relative: 0 % (ref 0–1)
Eosinophils Absolute: 0 10*3/uL (ref 0.0–0.7)
Lymphocytes Relative: 10 % — ABNORMAL LOW (ref 12–46)
Monocytes Absolute: 1.2 10*3/uL — ABNORMAL HIGH (ref 0.1–1.0)
Neutrophils Relative %: 73 % (ref 43–77)

## 2011-08-11 MED ORDER — FUROSEMIDE 20 MG PO TABS: 20.0000 mg | ORAL_TABLET | Freq: Every day | ORAL | Status: DC

## 2011-08-11 MED ORDER — LEVALBUTEROL HCL 0.63 MG/3ML IN NEBU: 0.6300 mg | INHALATION_SOLUTION | Freq: Three times a day (TID) | RESPIRATORY_TRACT | Status: DC | PRN

## 2011-08-11 MED ORDER — ASPIRIN EC 325 MG PO TBEC: DELAYED_RELEASE_TABLET | ORAL | Status: DC

## 2011-08-11 MED ORDER — TRIAMCINOLONE ACETONIDE 0.025 % EX OINT: TOPICAL_OINTMENT | Freq: Two times a day (BID) | CUTANEOUS | Status: DC

## 2011-08-11 NOTE — Telephone Encounter (Signed)
appts made and printed for pt aom °

## 2011-08-11 NOTE — Progress Notes (Signed)
Patient was sleeping but awoke around 2330 with c/o increased SOB while lying in bed. Respirations labored, respirations in upper 20's, anxious. sats in low 90's on O2 2l [place]. Patient reports that since O2 turned down earlier she "feels like she was not getting enough oxygen". Neb treatment given with decreased SOB per patient. Patient had refused advair tonight. O2 sats whlile changing from [place] to neb dropped into the mid 80's on room air. When neb completed placed patient on 3 [place] with sats in low 90's with [place] on. Will continue to monitor. Patient reports has HX of sleep apnea as well does not use CPAP only oxygen at home.

## 2011-08-11 NOTE — Progress Notes (Signed)
Pt d/ced home with daughter and oxygen. Gave d/c instructions, pt verbalized understanding. Levonne Lapping, RN

## 2011-08-11 NOTE — Discharge Summary (Signed)
Physician Discharge Summary  Patient ID: [name] MRN: [id number] DOB/AGE: [birth year] 66 y.o.  Admit date: 08/04/2011 Discharge date: 08/11/2011   Discharge Diagnoses:  1. Chemotherapy induced pancytopenia 2. 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 Lung congestion  Active Problems:  * No active hospital problems. *    Discharged Condition: fair  Hospital Course: The day of admission the patient had presented to the [facility] for her routine weekly labs. She was found to be pancytopenic with a total white count of 0.4, ANC of 0.1, hemoglobin 7.3, and platelets of 3. She been complaining of a 2 to three-day history of sore throat fatigue and blood-tinged sputum. She is afebrile. Patient was admitted for management of her severe chemotherapy-induced pancytopenia. She was placed on IV antibiotics, IV fluids and transfused a total of 4 units of packed red blood cells and 4 units of platelets. She was also treated with Neupogen at 480 mcg subcutaneously until her ANC was greater than 1000. This was discontinued on 08/06/2011. She did complain of increased dyspnea/"smothering spells". Her O2 sats during these episodes were in the upper 80s to low 90s. Her oxygen was titrated up to 4 L and she was treated with Xopenex nebulizer treatments with resolution. Chest x-ray was suggestive of edema. Patient was gently diuresed also with significant improvement in her dyspnea. She remained afebrile. Day of discharge labs revealed a white count of 7.3, ANC of 5.4, hemoglobin 10.4, and platelets of 36. Her oxygen was titrated down to 3 L with O2 sats in the low to mid 90s. Her blood-tinged secretions were felt to be secondary to local or irritation as well as thrombocytopenia.  Consults: None  Significant Diagnostic Studies: Chest x-ray  Treatments: IV hydration, antibiotics: Avelox and respiratory therapy: O2 and Xopenex nebs  Discharge Exam: Blood pressure  138/42, pulse 99, temperature 98.2 F (36.8 C), temperature source Oral, resp. rate 20, height 5\' 6"  (1.676 m), weight 78.336 kg (172 lb 11.2 oz), SpO2 92.00%. General appearance: alert, cooperative, appears stated age and no distress Head: Normocephalic, without obvious abnormality, atraumatic Throat: lips, mucosa, and tongue normal; teeth and gums normal Neck: no adenopathy, no carotid bruit, no JVD, supple, symmetrical, trachea midline and thyroid not enlarged, symmetric, no tenderness/mass/nodules Resp: clear to auscultation bilaterally Cardio: regular rate and rhythm, S1, S2 normal, no murmur, click, rub or gallop GI: soft, non-tender; bowel sounds normal; no masses,  no organomegaly Extremities: extremities normal, atraumatic, no cyanosis or edema Neurologic: Alert and oriented X 3, normal strength and tone. Normal symmetric reflexes. Normal coordination and gait  Disposition: 01-Home or Self Care Her restaging CT scans will be rescheduled to be performed on an outpatient basis and she'll followup with Dr. Arbutus Ped within a week of the CT scans to discuss the results and any further treatment.  Discharge Orders    Future Appointments: Provider: Department: Dept Phone: Center:   08/18/2011 12:30 PM Mauri [place] [facility] [phone] None   08/25/2011 12:30 PM Mauri [place] [facility] [phone] None   09/01/2011 9:45 AM Delcie Roch [facility] [phone] None   09/01/2011 10:15 AM [facility] [phone] None   09/01/2011 11:15 AM Anabel Bene, RD [facility] [phone] None     Future Orders Please Complete By Expires   Care order/instruction  08/05/11 08/07/11   Comments:   Transfuse Parameters     Medication List  As of 08/11/2011  9:16 AM   ASK your  doctor about these medications         albuterol 108 (90 BASE) MCG/ACT inhaler   Commonly known as: PROVENTIL HFA;VENTOLIN HFA   Inhale 2 puffs into the lungs every 6 (six) hours as needed.  For shortness of breath      ALPRAZolam 1 MG tablet   Commonly known as: XANAX   Take 1 mg by mouth 3 (three) times daily as needed. For anxiety      aspirin EC 325 MG tablet   Take 325 mg by mouth daily.      citalopram 40 MG tablet   Commonly known as: CELEXA   Take 40 mg by mouth daily.      COMBIVENT 18-103 MCG/ACT inhaler   Generic drug: albuterol-ipratropium   Inhale 2 puffs into the lungs every 6 (six) hours as needed. For shortness of breath      dexamethasone 4 MG tablet   Commonly known as: DECADRON   Take 4 mg by mouth 2 (two) times daily with a meal. Take 1 tablet twice a day the day before, the day of and the day after chemotherapy.      diltiazem 240 MG 24 hr capsule   Commonly known as: CARDIZEM CD   Take 1 capsule (240 mg total) by mouth daily.      Fluticasone-Salmeterol 250-50 MCG/DOSE Aepb   Commonly known as: ADVAIR   Inhale 1 puff into the lungs every 12 (twelve) hours.      folic acid 1 MG tablet   Commonly known as: FOLVITE   Take 1 mg by mouth daily.      gemfibrozil 600 MG tablet   Commonly known as: LOPID   Take 600 mg by mouth daily.      HYDROcodone-acetaminophen 7.5-750 MG per tablet   Commonly known as: VICODIN ES   Take 1 tablet by mouth every 6 (six) hours as needed. For severe pain      loratadine 10 MG tablet   Commonly known as: CLARITIN   Take 1 tablet (10 mg total) by mouth daily.      losartan 50 MG tablet   Commonly known as: COZAAR   Take 1 tablet (50 mg total) by mouth 2 (two) times daily.      magic mouthwash w/lidocaine Soln   Take 5 mLs by mouth 4 (four) times daily as needed.      metFORMIN 500 MG tablet   Commonly known as: GLUCOPHAGE   Take 500 mg by mouth 2 (two) times daily with a meal.      NON FORMULARY   Oxygen 2L prn      pantoprazole 40 MG tablet   Commonly known as: PROTONIX   Take 1 tablet (40 mg total) by mouth daily.      polyethylene glycol packet   Commonly known as: MIRALAX / GLYCOLAX   Take  17 g by mouth daily as needed. For constipation      potassium chloride SA 20 MEQ tablet   Commonly known as: K-DUR,KLOR-CON   Take 1 tablet (20 mEq total) by mouth 2 (two) times daily.      PRESCRIPTION MEDICATION   Receiving chemotherapy at [facility].  Regimen is Carboplatin for AUC 5 and Alimta 500mg /m*2 given every 3 weeks.   Last treament was 07-21-11; next scheduled for 08-11-11.  Oncologist is Dr. Arbutus Ped.      prochlorperazine 10 MG tablet   Commonly known as: COMPAZINE   Take 10 mg by mouth every 6 (  six) hours as needed. For nausea      senna 8.6 MG Tabs   Commonly known as: SENOKOT   Take 1 tablet by mouth daily as needed. For constipation      sucralfate 1 G tablet   Commonly known as: CARAFATE   Take 1 tablet (1 g total) by mouth 4 (four) times daily. Dissolve in 15 ml water.      traMADol 50 MG tablet   Commonly known as: ULTRAM   Take 50 mg by mouth every 4 (four) hours as needed. For moderate pain      triamcinolone ointment 0.1 %   Commonly known as: KENALOG   Apply topically 2 (two) times daily.      triamterene-hydrochlorothiazide 37.5-25 MG per tablet   Commonly known as: MAXZIDE-25   Take 1 tablet by mouth daily.             SignedConni Slipper PA-C 08/11/2011, 9:16 AM   Hematology/oncology attending: The patient is seen and examined today. She is feeling fine with no significant complaints except for mild dyspnea with exertion. She has significant improvement in her lung exam today with no crackles. Her platelets count continues to improve. We will discharge the patient today was followup next week after repeating CT scan of the chest. She was advised to call me immediately if she has any concerning symptoms.

## 2011-08-11 NOTE — Telephone Encounter (Signed)
s/w pt YN:WGNF for 5/6.  Aware to p/u contrast 5/2 5/3 as she states that she does not have any at home like i thought.  Also sent a note to [place] as pt thought she has an allergy to contrast and needs premeds/prednisone.  Also transferred her to sj vm/m re inhalers.     aom

## 2011-08-12 ENCOUNTER — Other Ambulatory Visit: Payer: Self-pay | Admitting: Physician Assistant

## 2011-08-12 ENCOUNTER — Telehealth: Payer: Self-pay | Admitting: Internal Medicine

## 2011-08-12 ENCOUNTER — Encounter: Payer: Self-pay | Admitting: *Deleted

## 2011-08-12 DIAGNOSIS — C3410 Malignant neoplasm of upper lobe, unspecified bronchus or lung: Secondary | ICD-10-CM

## 2011-08-12 NOTE — Progress Notes (Signed)
Called pt to discuss setting up nebulizers through [facility].  Pt began to c/o diarrhea that has continued since her hospitalization, along with blood when she has diarrhea.  She states she has a fever 100.5 and is not eating, dizziness, flushed, "feels awful", and thinks she has a clot in her right leg.  She described a bump on her calf that is tender, no redness or swelling noted per pt assessment.  Pt had not mentioned while hospitalized that she was having diarrhea.  She was just d/c'd yesterday.  Per AJ and Dr Donnald Garre, pt needs to go to the ED for evaluation.  Pt verbalized understanding.  SLJ

## 2011-08-12 NOTE — Telephone Encounter (Signed)
have tried several times today to call the pt and her number has been busy  aom

## 2011-08-13 ENCOUNTER — Encounter (HOSPITAL_COMMUNITY): Payer: Self-pay

## 2011-08-13 ENCOUNTER — Inpatient Hospital Stay (HOSPITAL_COMMUNITY)
Admission: EM | Admit: 2011-08-13 | Discharge: 2011-08-16 | DRG: 392 | Disposition: A | Discharge status: Home / Self Care | Payer: Medicare HMO | Attending: Internal Medicine | Admitting: Internal Medicine

## 2011-08-13 ENCOUNTER — Telehealth: Payer: Self-pay | Admitting: *Deleted

## 2011-08-13 DIAGNOSIS — C3490 Malignant neoplasm of unspecified part of unspecified bronchus or lung: Secondary | ICD-10-CM

## 2011-08-13 DIAGNOSIS — Z96649 Presence of unspecified artificial hip joint: Secondary | ICD-10-CM

## 2011-08-13 DIAGNOSIS — J449 Chronic obstructive pulmonary disease, unspecified: Secondary | ICD-10-CM | POA: Diagnosis present

## 2011-08-13 DIAGNOSIS — F3289 Other specified depressive episodes: Secondary | ICD-10-CM | POA: Diagnosis present

## 2011-08-13 DIAGNOSIS — E876 Hypokalemia: Secondary | ICD-10-CM | POA: Diagnosis not present

## 2011-08-13 DIAGNOSIS — E119 Type 2 diabetes mellitus without complications: Secondary | ICD-10-CM | POA: Diagnosis present

## 2011-08-13 DIAGNOSIS — E860 Dehydration: Secondary | ICD-10-CM | POA: Diagnosis not present

## 2011-08-13 DIAGNOSIS — J329 Chronic sinusitis, unspecified: Secondary | ICD-10-CM

## 2011-08-13 DIAGNOSIS — E785 Hyperlipidemia, unspecified: Secondary | ICD-10-CM

## 2011-08-13 DIAGNOSIS — R197 Diarrhea, unspecified: Secondary | ICD-10-CM

## 2011-08-13 DIAGNOSIS — C3410 Malignant neoplasm of upper lobe, unspecified bronchus or lung: Secondary | ICD-10-CM

## 2011-08-13 DIAGNOSIS — T451X5A Adverse effect of antineoplastic and immunosuppressive drugs, initial encounter: Secondary | ICD-10-CM | POA: Diagnosis present

## 2011-08-13 DIAGNOSIS — J9691 Respiratory failure, unspecified with hypoxia: Secondary | ICD-10-CM

## 2011-08-13 DIAGNOSIS — T3695XA Adverse effect of unspecified systemic antibiotic, initial encounter: Secondary | ICD-10-CM | POA: Diagnosis present

## 2011-08-13 DIAGNOSIS — F329 Major depressive disorder, single episode, unspecified: Secondary | ICD-10-CM | POA: Diagnosis present

## 2011-08-13 DIAGNOSIS — I2510 Atherosclerotic heart disease of native coronary artery without angina pectoris: Secondary | ICD-10-CM | POA: Diagnosis not present

## 2011-08-13 DIAGNOSIS — N183 Chronic kidney disease, stage 3 unspecified: Secondary | ICD-10-CM | POA: Diagnosis present

## 2011-08-13 DIAGNOSIS — D6959 Other secondary thrombocytopenia: Secondary | ICD-10-CM | POA: Diagnosis present

## 2011-08-13 DIAGNOSIS — I129 Hypertensive chronic kidney disease with stage 1 through stage 4 chronic kidney disease, or unspecified chronic kidney disease: Secondary | ICD-10-CM | POA: Diagnosis present

## 2011-08-13 DIAGNOSIS — N179 Acute kidney failure, unspecified: Secondary | ICD-10-CM | POA: Diagnosis present

## 2011-08-13 DIAGNOSIS — E875 Hyperkalemia: Secondary | ICD-10-CM | POA: Diagnosis present

## 2011-08-13 DIAGNOSIS — G2581 Restless legs syndrome: Secondary | ICD-10-CM | POA: Diagnosis present

## 2011-08-13 DIAGNOSIS — I739 Peripheral vascular disease, unspecified: Secondary | ICD-10-CM

## 2011-08-13 DIAGNOSIS — D696 Thrombocytopenia, unspecified: Secondary | ICD-10-CM

## 2011-08-13 DIAGNOSIS — R042 Hemoptysis: Secondary | ICD-10-CM | POA: Diagnosis present

## 2011-08-13 DIAGNOSIS — K219 Gastro-esophageal reflux disease without esophagitis: Secondary | ICD-10-CM | POA: Diagnosis present

## 2011-08-13 DIAGNOSIS — J4489 Other specified chronic obstructive pulmonary disease: Secondary | ICD-10-CM

## 2011-08-13 DIAGNOSIS — A088 Other specified intestinal infections: Principal | ICD-10-CM | POA: Diagnosis present

## 2011-08-13 LAB — CBC
MCH: 29.4 pg (ref 26.0–34.0)
MCHC: 35.6 g/dL (ref 30.0–36.0)
MCV: 82.6 fL (ref 78.0–100.0)
Platelets: 37 10*3/uL — ABNORMAL LOW (ref 150–400)
RBC: 6.16 MIL/uL — ABNORMAL HIGH (ref 3.87–5.11)
RDW: 15.3 % (ref 11.5–15.5)

## 2011-08-13 LAB — BASIC METABOLIC PANEL
CO2: 24 mEq/L (ref 19–32)
Calcium: 7 mg/dL — ABNORMAL LOW (ref 8.4–10.5)
Creatinine, Ser: 1.28 mg/dL — ABNORMAL HIGH (ref 0.50–1.10)
Glucose, Bld: 100 mg/dL — ABNORMAL HIGH (ref 70–99)

## 2011-08-13 LAB — DIFFERENTIAL
Eosinophils Relative: 0 % (ref 0–5)
Lymphs Abs: 0.3 10*3/uL — ABNORMAL LOW (ref 0.7–4.0)
Monocytes Absolute: 0.4 10*3/uL (ref 0.1–1.0)

## 2011-08-13 MED ORDER — PANTOPRAZOLE SODIUM 40 MG PO TBEC
40.0000 mg | DELAYED_RELEASE_TABLET | Freq: Every day | ORAL | Status: DC
  Administered 2011-08-14 – 2011-08-16 (×4): 40 mg via ORAL
  Filled 2011-08-13 (×4): qty 1

## 2011-08-13 MED ORDER — IPRATROPIUM-ALBUTEROL 18-103 MCG/ACT IN AERO
2.0000 | INHALATION_SPRAY | Freq: Four times a day (QID) | RESPIRATORY_TRACT | Status: DC | PRN
  Filled 2011-08-13: qty 14.7

## 2011-08-13 MED ORDER — LEVALBUTEROL HCL 0.63 MG/3ML IN NEBU
0.6300 mg | INHALATION_SOLUTION | Freq: Three times a day (TID) | RESPIRATORY_TRACT | Status: DC | PRN
  Filled 2011-08-13: qty 3

## 2011-08-13 MED ORDER — INSULIN ASPART 100 UNIT/ML ~~LOC~~ SOLN
0.0000 [IU] | Freq: Three times a day (TID) | SUBCUTANEOUS | Status: DC
  Administered 2011-08-14: 2 [IU] via SUBCUTANEOUS
  Administered 2011-08-14: 11 [IU] via SUBCUTANEOUS
  Administered 2011-08-15 (×3): 3 [IU] via SUBCUTANEOUS
  Administered 2011-08-16: 5 [IU] via SUBCUTANEOUS
  Administered 2011-08-16: 2 [IU] via SUBCUTANEOUS

## 2011-08-13 MED ORDER — GEMFIBROZIL 600 MG PO TABS
600.0000 mg | ORAL_TABLET | Freq: Every day | ORAL | Status: DC
  Administered 2011-08-14 – 2011-08-16 (×3): 600 mg via ORAL
  Filled 2011-08-13 (×4): qty 1

## 2011-08-13 MED ORDER — SODIUM CHLORIDE 0.9 % IV BOLUS (SEPSIS)
1000.0000 mL | Freq: Once | INTRAVENOUS | Status: AC
  Administered 2011-08-13: 1000 mL via INTRAVENOUS

## 2011-08-13 MED ORDER — HYDROCODONE-ACETAMINOPHEN 5-325 MG PO TABS: 1.5000 | ORAL_TABLET | Freq: Four times a day (QID) | ORAL | Status: DC | PRN

## 2011-08-13 MED ORDER — KCL IN DEXTROSE-NACL 20-5-0.9 MEQ/L-%-% IV SOLN
INTRAVENOUS | Status: DC
  Administered 2011-08-13: 23:00:00 via INTRAVENOUS
  Administered 2011-08-14: 75 mL via INTRAVENOUS
  Filled 2011-08-13 (×6): qty 1000

## 2011-08-13 MED ORDER — TRAMADOL HCL 50 MG PO TABS: 50.0000 mg | ORAL_TABLET | ORAL | Status: DC | PRN

## 2011-08-13 MED ORDER — POTASSIUM CHLORIDE CRYS ER 20 MEQ PO TBCR
20.0000 meq | EXTENDED_RELEASE_TABLET | Freq: Two times a day (BID) | ORAL | Status: DC
  Administered 2011-08-13 – 2011-08-14 (×3): 20 meq via ORAL
  Filled 2011-08-13 (×5): qty 1

## 2011-08-13 MED ORDER — ALBUTEROL SULFATE HFA 108 (90 BASE) MCG/ACT IN AERS
2.0000 | INHALATION_SPRAY | Freq: Four times a day (QID) | RESPIRATORY_TRACT | Status: DC | PRN
  Filled 2011-08-13: qty 6.7

## 2011-08-13 MED ORDER — ALPRAZOLAM 1 MG PO TABS
1.0000 mg | ORAL_TABLET | Freq: Three times a day (TID) | ORAL | Status: DC | PRN
  Administered 2011-08-14 – 2011-08-15 (×3): 1 mg via ORAL
  Filled 2011-08-13 (×4): qty 1

## 2011-08-13 MED ORDER — DEXAMETHASONE 4 MG PO TABS
4.0000 mg | ORAL_TABLET | Freq: Two times a day (BID) | ORAL | Status: DC
  Administered 2011-08-14 – 2011-08-16 (×5): 4 mg via ORAL
  Filled 2011-08-13 (×8): qty 1

## 2011-08-13 MED ORDER — SUCRALFATE 1 G PO TABS
1.0000 g | ORAL_TABLET | Freq: Four times a day (QID) | ORAL | Status: DC
  Administered 2011-08-13 – 2011-08-16 (×11): 1 g via ORAL
  Filled 2011-08-13 (×13): qty 1

## 2011-08-13 MED ORDER — LORATADINE 10 MG PO TABS
10.0000 mg | ORAL_TABLET | Freq: Every day | ORAL | Status: DC
  Administered 2011-08-13 – 2011-08-16 (×4): 10 mg via ORAL
  Filled 2011-08-13 (×4): qty 1

## 2011-08-13 MED ORDER — LOSARTAN POTASSIUM 50 MG PO TABS
50.0000 mg | ORAL_TABLET | Freq: Two times a day (BID) | ORAL | Status: DC
  Administered 2011-08-13 – 2011-08-14 (×3): 50 mg via ORAL
  Filled 2011-08-13 (×5): qty 1

## 2011-08-13 MED ORDER — SODIUM CHLORIDE 0.9 % IV SOLN: INTRAVENOUS | Status: DC

## 2011-08-13 MED ORDER — DILTIAZEM HCL ER COATED BEADS 240 MG PO CP24
240.0000 mg | ORAL_CAPSULE | Freq: Every day | ORAL | Status: DC
  Administered 2011-08-14 – 2011-08-16 (×3): 240 mg via ORAL
  Filled 2011-08-13 (×3): qty 1

## 2011-08-13 MED ORDER — POTASSIUM CHLORIDE 10 MEQ/100ML IV SOLN
10.0000 meq | INTRAVENOUS | Status: AC
  Administered 2011-08-13 – 2011-08-14 (×3): 10 meq via INTRAVENOUS
  Filled 2011-08-13 (×3): qty 100

## 2011-08-13 MED ORDER — CITALOPRAM HYDROBROMIDE 40 MG PO TABS
40.0000 mg | ORAL_TABLET | Freq: Every day | ORAL | Status: DC
  Administered 2011-08-14 – 2011-08-16 (×3): 40 mg via ORAL
  Filled 2011-08-13 (×3): qty 1

## 2011-08-13 MED ORDER — ONDANSETRON HCL 4 MG/2ML IJ SOLN
4.0000 mg | Freq: Once | INTRAMUSCULAR | Status: AC
  Administered 2011-08-13: 4 mg via INTRAVENOUS
  Filled 2011-08-13: qty 2

## 2011-08-13 MED ORDER — HYDROCODONE-ACETAMINOPHEN 5-500 MG PO TABS: 1.5000 | ORAL_TABLET | Freq: Four times a day (QID) | ORAL | Status: DC | PRN

## 2011-08-13 MED ORDER — ASPIRIN EC 81 MG PO TBEC
81.0000 mg | DELAYED_RELEASE_TABLET | Freq: Every day | ORAL | Status: DC
  Administered 2011-08-13 – 2011-08-16 (×4): 81 mg via ORAL
  Filled 2011-08-13 (×4): qty 1

## 2011-08-13 MED ORDER — FOLIC ACID 1 MG PO TABS
1.0000 mg | ORAL_TABLET | Freq: Every day | ORAL | Status: DC
  Administered 2011-08-13 – 2011-08-16 (×4): 1 mg via ORAL
  Filled 2011-08-13 (×4): qty 1

## 2011-08-13 MED ORDER — POTASSIUM CHLORIDE CRYS ER 20 MEQ PO TBCR
40.0000 meq | EXTENDED_RELEASE_TABLET | Freq: Once | ORAL | Status: AC
  Administered 2011-08-13: 40 meq via ORAL
  Filled 2011-08-13: qty 2

## 2011-08-13 MED ORDER — METRONIDAZOLE IN NACL 5-0.79 MG/ML-% IV SOLN
500.0000 mg | Freq: Three times a day (TID) | INTRAVENOUS | Status: DC
  Administered 2011-08-13 – 2011-08-15 (×5): 500 mg via INTRAVENOUS
  Filled 2011-08-13 (×7): qty 100

## 2011-08-13 NOTE — ED Notes (Addendum)
Pt has been having diarrhea. 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 2 in aorta found in a year and a half ago. Blood clot noted in right lower leg 4 years ago. Blood clot size is a quarter size. She takes Aspirin, but she hasn't had aspirin in 8 days. Changes in breathing in the last 4 days include asthma attacks despite oxygen. Has recently required blood transfusion and platelets. Scheduled for chemo this week and is being held. Last chemo was 3 and a half weeks ago. History of lung cancer. Pt states left upper lobe taken out in 84 and she four tumors (one in the middle, one in the right that's malignant, one under the right, and a small one all the way to the left. She takes Allergic to codeine, iodine, and penicillin. She takes "fluid pills".

## 2011-08-13 NOTE — ED Notes (Signed)
Patient reports that she was discharged from hospital after receiving blood and platelets 2 days ago. Patient reports that she had nausea and diarrhea prior to being discharged and is continuing to have diarrhea and nausea. Patient also reports that she had a fever last night. Patient state she took Tylenol prior to coming to the hospital today

## 2011-08-13 NOTE — Telephone Encounter (Signed)
Received call from caregiver stating that pt talked with Judeth Cornfield yest & was told to go to the ED.  She reports that she is having smothering spells, bleeding from vagina or rectum, diarrhea, & fever & is not eating well & would like [place] to call the pt.  Called & spoke with pt & encouraged to go to the ED per instructions yest & she said she would.

## 2011-08-13 NOTE — H&P (Signed)
PCP:   [name]., MD, MD   Chief Complaint: Diarrhea for 3-4 days   HPI: [name] is an 66 y.o. female with history of adenocarcinoma of the lung, hypertension, restless leg syndrome, chronic sinusitis, type 2 diabetes, asthma, discharged 3 days ago for hospital stay due to post chemotherapy pancytopenia. She was given antibiotic for her sinusitis. Has been no ill contact or distant travel. She has no bloody stool. She has some fever but no chills. Workup in emergency room included a potassium of 3.1, creatinine of 1.28, white count of 3.1 thousand, and hemoglobin of 18 g per decaliter. Her platelet count remains low at 37,000. Hospitalist was asked to admit her under observation as she was dehydrated. She also has nausea, and has not been eating adequately at home.  Rewiew of Systems:  The patient denies anorexia, weight loss,, vision loss, decreased hearing, hoarseness, chest pain, syncope, dyspnea on exertion, peripheral edema, balance deficits, hemoptysis, abdominal pain, melena, hematochezia, severe indigestion/heartburn, hematuria, incontinence, genital sores, muscle weakness, suspicious skin lesions, transient blindness, difficulty walking, depression, unusual weight change, abnormal bleeding, enlarged lymph nodes, angioedema, and breast masses.   Past Medical History  Diagnosis Date  . Polyneuropathy in diabetes   . Restless legs syndrome (RLS)   . Chronic sinusitis   . Hypertension   . Hyperlipidemia   . Type II or unspecified type diabetes mellitus without mention of complication, not stated as uncontrolled   . Anemia, unspecified   . GERD (gastroesophageal reflux disease)   . Respiratory failure with hypoxia 01/02/2010  . Complication of anesthesia     slow to wake up  . Adenocarcinoma, lung     upper left lobe  . COPD (chronic obstructive pulmonary disease)   . Emphysema   . Asthma   . Coronary artery disease     Dr. Excell Seltzer had stress test done  . Dysrhythmia      unsure what type  . Peripheral vascular disease   . Non-small cell lung cancer 03/31/2011    Adenocarcinoma Rt upper lobe mass    Past Surgical History  Procedure Date  . Lung lobectomy     left  . Septoplasty   . Total hip arthroplasty 01/2010    left  . Bronchoscopy 02/2011  . Tubal ligation     Medications:  HOME MEDS: Prior to Admission medications   Medication Sig Start Date End Date Taking? Authorizing Provider  albuterol (PROAIR HFA) 108 (90 BASE) MCG/ACT inhaler Inhale 2 puffs into the lungs every 6 (six) hours as needed. For shortness of breath 07/06/11  Yes [name], MD  albuterol-ipratropium (COMBIVENT) 18-103 MCG/ACT inhaler Inhale 2 puffs into the lungs every 6 (six) hours as needed. For shortness of breath   Yes Historical Provider, MD  ALPRAZolam Prudy Feeler) 1 MG tablet Take 1 mg by mouth 3 (three) times daily as needed. For anxiety   Yes Historical Provider, MD  Alum & Mag Hydroxide-Simeth (MAGIC MOUTHWASH W/LIDOCAINE) SOLN Take 5 mLs by mouth 4 (four) times daily as needed. 07/28/11  Yes Si Gaul, MD  aspirin EC 325 MG tablet Hold for the next 4 days then resume taking one tablet by mouth daily 08/11/11  Yes Conni Slipper, PA  citalopram (CELEXA) 40 MG tablet Take 40 mg by mouth daily.   Yes Historical Provider, MD  dexamethasone (DECADRON) 4 MG tablet Take 4 mg by mouth 2 (two) times daily with a meal. Take 1 tablet twice a day the day before,  the day of and the day after chemotherapy.    Yes Historical Provider, MD  diltiazem (CARDIZEM CD) 240 MG 24 hr capsule Take 1 capsule (240 mg total) by mouth daily. 07/06/11  Yes Tonny Bollman, MD  folic acid (FOLVITE) 1 MG tablet Take 1 mg by mouth daily.   Yes Historical Provider, MD  furosemide (LASIX) 20 MG tablet Take 1 tablet (20 mg total) by mouth daily. 08/11/11 08/10/12 Yes Conni Slipper, PA  gemfibrozil (LOPID) 600 MG tablet Take 600 mg by mouth daily.    Yes Historical Provider, MD    HYDROcodone-acetaminophen (VICODIN ES) 7.5-750 MG per tablet Take 1 tablet by mouth every 6 (six) hours as needed. For severe pain   Yes Historical Provider, MD  loratadine (CLARITIN) 10 MG tablet Take 1 tablet (10 mg total) by mouth daily. 07/14/11 07/13/12 Yes [name], MD  losartan (COZAAR) 50 MG tablet Take 1 tablet (50 mg total) by mouth 2 (two) times daily. 07/28/11  Yes Tonny Bollman, MD  metFORMIN (GLUCOPHAGE) 500 MG tablet Take 500 mg by mouth 2 (two) times daily with a meal.    Yes Historical Provider, MD  pantoprazole (PROTONIX) 40 MG tablet Take 1 tablet (40 mg total) by mouth daily. 07/05/11 07/04/12 Yes Si Gaul, MD  polyethylene glycol [facility] / GLYCOLAX) packet Take 17 g by mouth daily as needed. For constipation   Yes Historical Provider, MD  potassium chloride SA (K-DUR,KLOR-CON) 20 MEQ tablet Take 1 tablet (20 mEq total) by mouth 2 (two) times daily. 03/03/11  Yes Tonny Bollman, MD  PRESCRIPTION MEDICATION Receiving chemotherapy at [facility].  Regimen is Carboplatin for AUC 5 and Alimta 500mg /m*2 given every 3 weeks.   Last treament was 07-21-11; next scheduled for 08-11-11.  Oncologist is Dr. Arbutus Ped. Pt did not get her chemo treatment on 08-11-11   Yes Historical Provider, MD  prochlorperazine (COMPAZINE) 10 MG tablet Take 10 mg by mouth every 6 (six) hours as needed. For nausea   Yes Historical Provider, MD  senna (SENOKOT) 8.6 MG TABS Take 1 tablet by mouth daily as needed. For constipation   Yes Historical Provider, MD  traMADol (ULTRAM) 50 MG tablet Take 50 mg by mouth every 4 (four) hours as needed. For moderate pain   Yes Historical Provider, MD  triamcinolone (KENALOG) 0.025 % ointment Apply topically 2 (two) times daily. 08/11/11 08/10/12 Yes Conni Slipper, PA  triamcinolone ointment (KENALOG) 0.1 % Apply topically 2 (two) times daily.   Yes Historical Provider, MD  triamterene-hydrochlorothiazide (MAXZIDE-25) 37.5-25 MG per tablet Take 1 tablet by mouth daily. 01/01/11  Yes  Historical Provider, MD  levalbuterol (XOPENEX) 0.63 MG/3ML nebulizer solution Take 3 mLs (0.63 mg total) by nebulization every 8 (eight) hours as needed for wheezing or shortness of breath. 08/11/11 08/10/12  Conni Slipper, PA  NON FORMULARY Oxygen 2L prn    Historical Provider, MD  sucralfate (CARAFATE) 1 G tablet Take 1 tablet (1 g total) by mouth 4 (four) times daily. Dissolve in 15 ml water. 06/03/11 06/02/12  Jonna Coup, MD     Allergies:  Allergies  Allergen Reactions  . Iohexol Shortness Of Breath and Other (See Comments)    Burning of skin  . Codeine     REACTION: GI upset  . Penicillins     REACTION: itching    Social History:   reports that she quit smoking about 14 years ago. Her smoking use included Cigarettes. She has a 60 pack-year smoking history.  She has never used smokeless tobacco. She reports that she uses illicit drugs (Hydrocodone). She reports that she does not drink alcohol.  Family History: Family History  Problem Relation Age of Onset  . Bone cancer Father   . Diabetes Mother      Physical Exam: Filed Vitals:   08/13/11 1535 08/13/11 2107 08/13/11 2124  BP: 134/58  139/50  Pulse: 103 94 94  Temp: 98.3 F (36.8 C)    TempSrc: Oral    Resp: 18 18 18   SpO2: 91% 95%    Blood pressure 139/50, pulse 94, temperature 98.3 F (36.8 C), temperature source Oral, resp. rate 18, SpO2 95.00%.  GEN:  Pleasant  person lying in the stretcher in no acute distress; cooperative with exam PSYCH:  alert and oriented x4; does not appear anxious or depressed; affect is appropriate. HEENT: Mucous membranes pink and anicteric; PERRLA; EOM intact; no cervical lymphadenopathy nor thyromegaly or carotid bruit; no JVD; Breasts:: Not examined CHEST WALL: No tenderness CHEST: Normal respiration, clear to auscultation bilaterally HEART: Regular rate and rhythm; no murmurs rubs or gallops BACK: No kyphosis or scoliosis; no CVA tenderness ABDOMEN: Obese, ; no masses, no  organomegaly, normal abdominal bowel sounds; no pannus; no intertriginous candida. She has right quadrant pain but no rebound. Rectal Exam: Not done EXTREMITIES: No bone or joint deformity; age-appropriate arthropathy of the hands and knees; no edema; no ulcerations. Genitalia: not examined PULSES: 2+ and symmetric SKIN: Normal hydration no rash or ulceration CNS: Cranial nerves 2-12 grossly intact no focal lateralizing neurologic deficit   Labs & Imaging Results for orders placed during the hospital encounter of 08/13/11 (from the past 48 hour(s))  CBC     Status: Abnormal   Collection Time   08/13/11  5:00 PM      Component Value Range Comment   WBC 3.1 (*) 4.0 - 10.5 (K/uL)    RBC 6.16 (*) 3.87 - 5.11 (MIL/uL)    Hemoglobin 18.1 (*) 12.0 - 15.0 (g/dL)    HCT 16.1 (*) 09.6 - 46.0 (%)    MCV 82.6  78.0 - 100.0 (fL)    MCH 29.4  26.0 - 34.0 (pg)    MCHC 35.6  30.0 - 36.0 (g/dL)    RDW 04.5  40.9 - 81.1 (%)    Platelets 37 (*) 150 - 400 (K/uL) SPECIMEN CHECKED FOR CLOTS  DIFFERENTIAL     Status: Abnormal   Collection Time   08/13/11  5:00 PM      Component Value Range Comment   Neutrophils Relative 77  43 - 77 (%)    Lymphocytes Relative 9 (*) 12 - 46 (%)    Monocytes Relative 14 (*) 3 - 12 (%)    Eosinophils Relative 0  0 - 5 (%)    Basophils Relative 0  0 - 1 (%)    Neutro Abs 2.4  1.7 - 7.7 (K/uL)    Lymphs Abs 0.3 (*) 0.7 - 4.0 (K/uL)    Monocytes Absolute 0.4  0.1 - 1.0 (K/uL)    Eosinophils Absolute 0.0  0.0 - 0.7 (K/uL)    Basophils Absolute 0.0  0.0 - 0.1 (K/uL)    Smear Review PLATELET COUNT CONFIRMED BY SMEAR     BASIC METABOLIC PANEL     Status: Abnormal   Collection Time   08/13/11  5:00 PM      Component Value Range Comment   Sodium 137  135 - 145 (mEq/L)    Potassium 3.1 (*)  3.5 - 5.1 (mEq/L)    Chloride 94 (*) 96 - 112 (mEq/L)    CO2 24  19 - 32 (mEq/L)    Glucose, Bld 100 (*) 70 - 99 (mg/dL)    BUN 17  6 - 23 (mg/dL)    Creatinine, Ser 1.61 (*) 0.50 - 1.10  (mg/dL)    Calcium 7.0 (*) 8.4 - 10.5 (mg/dL)    GFR calc non Af Amer 42 (*) >90 (mL/min)    GFR calc Af Amer 49 (*) >90 (mL/min)    No results found.    Assessment Present on Admission:  .T1N0 AdenoCA of RUL .Dehydration Diarrhea, suspicious for C. difficile Chronic sinusitis Hypokalemia DM2  PLAN: Will admit her under observation,  replete her potassium, and give IV fluid. I will start her on clear liquids and progress as tolerated. C. difficile PCR and stool culture was done, however, given the high clinical suspicion, I will start her on Flagyl. Rest of her medications will be continued. Since Glucophage can cause diarrhea, will temporarily hold at this time. She is a full code and will be admitted to triad hospitalist service. For her diabetes we'll put on insulin sliding scale. Other plans as per orders.   Lindzee Gouge 08/13/2011, 9:47 PM

## 2011-08-13 NOTE — ED Notes (Signed)
Pt states that her blood clot in the right lower leg was recently obtained since her last hospital visit (so, when she was off Aspirin for 8 days) instead of 4 years prior as noted in the admission ED file.

## 2011-08-13 NOTE — ED Provider Notes (Signed)
History     CSN: [id number]  Arrival date & time 08/13/11  1432   First MD Initiated Contact with Patient 08/13/11 1605      Chief Complaint  Patient presents with  . Diarrhea  . Nausea    (Consider location/radiation/quality/duration/timing/severity/associated sxs/prior treatment) HPI Complains of diarrhea, nausea and generalized weakness for approximately the past 3 days area no treatment prior to coming here she's been only able to hold down a few sips of fluids, due to extreme nausea. Temperature 101.5 two days ago denies cough denies shortness of breath complaint patient discharged from here 08/11/2011 for chemotherapy-induced pancytopenia. Past Medical History  Diagnosis Date  . Polyneuropathy in diabetes   . Restless legs syndrome (RLS)   . Chronic sinusitis   . Hypertension   . Hyperlipidemia   . Type II or unspecified type diabetes mellitus without mention of complication, not stated as uncontrolled   . Anemia, unspecified   . GERD (gastroesophageal reflux disease)   . Respiratory failure with hypoxia 01/02/2010  . Complication of anesthesia     slow to wake up  . Adenocarcinoma, lung     upper left lobe  . COPD (chronic obstructive pulmonary disease)   . Emphysema   . Asthma   . Coronary artery disease     Dr. Excell Seltzer had stress test done  . Dysrhythmia     unsure what type  . Peripheral vascular disease   . Non-small cell lung cancer 03/31/2011    Adenocarcinoma Rt upper lobe mass    Past Surgical History  Procedure Date  . Lung lobectomy     left  . Septoplasty   . Total hip arthroplasty 01/2010    left  . Bronchoscopy 02/2011  . Tubal ligation     Family History  Problem Relation Age of Onset  . Bone cancer Father   . Diabetes Mother     History  Substance Use Topics  . Smoking status: Former Smoker -- 1.5 packs/day for 40 years    Types: Cigarettes    Quit date: 02/10/1997  . Smokeless tobacco: Never Used  . Alcohol Use: No     quit 1999     OB History    Grav Para Term Preterm Abortions TAB SAB Ect Mult Living                  Review of Systems  Constitutional: Positive for fever and activity change.  Gastrointestinal: Positive for nausea and diarrhea.  All other systems reviewed and are negative.    Allergies  Iohexol; Codeine; and Penicillins  Home Medications   Current Outpatient Rx  Name Route Sig Dispense Refill  . ALBUTEROL SULFATE HFA 108 (90 BASE) MCG/ACT IN AERS Inhalation Inhale 2 puffs into the lungs every 6 (six) hours as needed. For shortness of breath 1 Inhaler 2  . IPRATROPIUM-ALBUTEROL 18-103 MCG/ACT IN AERO Inhalation Inhale 2 puffs into the lungs every 6 (six) hours as needed. For shortness of breath    . ALPRAZOLAM 1 MG PO TABS Oral Take 1 mg by mouth 3 (three) times daily as needed. For anxiety    . MAGIC MOUTHWASH W/LIDOCAINE Oral Take 5 mLs by mouth 4 (four) times daily as needed. 200 mL 0  . ASPIRIN EC 325 MG PO TBEC  Hold for the next 4 days then resume taking one tablet by mouth daily 30 tablet 1  . CITALOPRAM HYDROBROMIDE 40 MG PO TABS Oral Take 40 mg by mouth daily.    Marland Kitchen  DEXAMETHASONE 4 MG PO TABS Oral Take 4 mg by mouth 2 (two) times daily with a meal. Take 1 tablet twice a day the day before, the day of and the day after chemotherapy.     Marland Kitchen DILTIAZEM HCL ER COATED BEADS 240 MG PO CP24 Oral Take 1 capsule (240 mg total) by mouth daily. 30 capsule 6  . FOLIC ACID 1 MG PO TABS Oral Take 1 mg by mouth daily.    . FUROSEMIDE 20 MG PO TABS Oral Take 1 tablet (20 mg total) by mouth daily. 3 tablet 0  . GEMFIBROZIL 600 MG PO TABS Oral Take 600 mg by mouth daily.     Marland Kitchen HYDROCODONE-ACETAMINOPHEN 7.5-750 MG PO TABS Oral Take 1 tablet by mouth every 6 (six) hours as needed. For severe pain    . LORATADINE 10 MG PO TABS Oral Take 1 tablet (10 mg total) by mouth daily. 30 tablet 3  . LOSARTAN POTASSIUM 50 MG PO TABS Oral Take 1 tablet (50 mg total) by mouth 2 (two) times daily. 60 tablet 6  .  METFORMIN HCL 500 MG PO TABS Oral Take 500 mg by mouth 2 (two) times daily with a meal.     . PANTOPRAZOLE SODIUM 40 MG PO TBEC Oral Take 1 tablet (40 mg total) by mouth daily. 30 tablet 1  . POLYETHYLENE GLYCOL 3350 PO PACK Oral Take 17 g by mouth daily as needed. For constipation    . POTASSIUM CHLORIDE CRYS ER 20 MEQ PO TBCR Oral Take 1 tablet (20 mEq total) by mouth 2 (two) times daily. 60 tablet 6  . PRESCRIPTION MEDICATION  Receiving chemotherapy at [facility].  Regimen is Carboplatin for AUC 5 and Alimta 500mg /m*2 given every 3 weeks.   Last treament was 07-21-11; next scheduled for 08-11-11.  Oncologist is Dr. Arbutus Ped. Pt did not get her chemo treatment on 08-11-11    . PROCHLORPERAZINE MALEATE 10 MG PO TABS Oral Take 10 mg by mouth every 6 (six) hours as needed. For nausea    . SENNA 8.6 MG PO TABS Oral Take 1 tablet by mouth daily as needed. For constipation    . TRAMADOL HCL 50 MG PO TABS Oral Take 50 mg by mouth every 4 (four) hours as needed. For moderate pain    . TRIAMCINOLONE ACETONIDE 0.025 % EX OINT Topical Apply topically 2 (two) times daily. 15 g 0  . TRIAMCINOLONE ACETONIDE 0.1 % EX OINT Topical Apply topically 2 (two) times daily.    . TRIAMTERENE-HCTZ 37.5-25 MG PO TABS Oral Take 1 tablet by mouth daily.    Marland Kitchen LEVALBUTEROL HCL 0.63 MG/3ML IN NEBU Nebulization Take 3 mLs (0.63 mg total) by nebulization every 8 (eight) hours as needed for wheezing or shortness of breath. 90 mL 1    Dispense as 30 3 ml vials, one vial inhaled via ne ...  . NON FORMULARY  Oxygen 2L prn    . SUCRALFATE 1 G PO TABS Oral Take 1 tablet (1 g total) by mouth 4 (four) times daily. Dissolve in 15 ml water. 90 tablet 1    BP 134/58  Pulse 103  Temp(Src) 98.3 F (36.8 C) (Oral)  Resp 18  SpO2 91%  Physical Exam  Nursing note and vitals reviewed. Constitutional: She appears well-developed and well-nourished.       Chronically ill-appearing  HENT:  Head: Normocephalic and atraumatic.       Because murmurs  dry  Eyes: Conjunctivae are normal. Pupils are equal, round,  and reactive to light.  Neck: Neck supple. No tracheal deviation present. No thyromegaly present.  Cardiovascular: Normal rate and regular rhythm.   No murmur heard. Pulmonary/Chest: Effort normal and breath sounds normal.  Abdominal: Soft. Bowel sounds are normal. She exhibits no distension. There is tenderness.       Minimal diffuse tenderness  Musculoskeletal: Normal range of motion. She exhibits no edema and no tenderness.  Neurological: She is alert. Coordination normal.  Skin: Skin is warm and dry. No rash noted.  Psychiatric: She has a normal mood and affect.    ED Course  Procedures (including critical care time)   Labs Reviewed  CBC  DIFFERENTIAL  BASIC METABOLIC PANEL  CLOSTRIDIUM DIFFICILE BY PCR   No results found.   No diagnosis found.   Spoke with Dr. Coralie Keens who requests I call the oncologist for admission. Spoke with Dr.Murinson who deferred to hospitalist. Dr. Conley Rolls reconsulted MDM  Plan 23 hour observation IV hydration Med surge floor Diagnosis #1 dehydration #2 diarrhea #3 hypokalemia #4 thrombocytopenia        Doug Sou, MD 08/13/11 2005

## 2011-08-14 DIAGNOSIS — N179 Acute kidney failure, unspecified: Secondary | ICD-10-CM

## 2011-08-14 DIAGNOSIS — R197 Diarrhea, unspecified: Secondary | ICD-10-CM

## 2011-08-14 DIAGNOSIS — E860 Dehydration: Secondary | ICD-10-CM | POA: Diagnosis not present

## 2011-08-14 DIAGNOSIS — E876 Hypokalemia: Secondary | ICD-10-CM | POA: Diagnosis not present

## 2011-08-14 LAB — BASIC METABOLIC PANEL
Chloride: 99 mEq/L (ref 96–112)
Creatinine, Ser: 1.45 mg/dL — ABNORMAL HIGH (ref 0.50–1.10)
GFR calc Af Amer: 42 mL/min — ABNORMAL LOW (ref >90)
Potassium: 4.1 mEq/L (ref 3.5–5.1)
Sodium: 134 mEq/L — ABNORMAL LOW (ref 135–145)

## 2011-08-14 LAB — CBC
Hemoglobin: 9.4 g/dL — ABNORMAL LOW (ref 12.0–15.0)
MCH: 28.6 pg (ref 26.0–34.0)
RBC: 3.29 MIL/uL — ABNORMAL LOW (ref 3.87–5.11)

## 2011-08-14 LAB — GLUCOSE, CAPILLARY
Glucose-Capillary: 142 mg/dL — ABNORMAL HIGH (ref 70–99)
Glucose-Capillary: 118 mg/dL — ABNORMAL HIGH (ref 70–99)

## 2011-08-14 LAB — CLOSTRIDIUM DIFFICILE BY PCR: Toxigenic C. Difficile by PCR: NEGATIVE

## 2011-08-14 MED ORDER — HYDROCODONE-ACETAMINOPHEN 5-325 MG PO TABS
1.5000 | ORAL_TABLET | Freq: Four times a day (QID) | ORAL | Status: DC | PRN
  Administered 2011-08-15: 1.5 via ORAL
  Filled 2011-08-14: qty 2

## 2011-08-14 NOTE — Progress Notes (Signed)
Subjective:   Chart reviewed. Patient indicates that she had diarrhea during the recent hospitalization which had improved prior to discharge. She then started having profuse diarrhea 2 days ago with? Minimal dried blood on wipes. Mild abdominal pain/discomfort. Poor appetite but no nausea or vomiting. Since admission she says she's feeling much better and was finally able to eat after to 3 days. Tolerated breakfast. No nausea vomiting. 2 BMs since admission. She had not been discharged on any antibiotics but was said to be on IV antibiotics in the hospital.  Objective  Vital signs in last 24 hours: Filed Vitals:   08/13/11 2107 08/13/11 2124 08/13/11 2200 08/14/11 0600  BP:  139/50 120/66 115/67  Pulse: 94 94 92 86  Temp:   98.7 F (37.1 C) 98.4 F (36.9 C)  TempSrc:   Oral Oral  Resp: 18 18 20 18   Height:   5\' 6"  (1.676 m)   Weight:   78.654 kg (173 lb 6.4 oz)   SpO2: 95%  96% 97%   Weight change:   Intake/Output Summary (Last 24 hours) at 08/14/11 1130 Last data filed at 08/14/11 0700  Gross per 24 hour  Intake   1200 ml  Output      0 ml  Net   1200 ml    Physical Exam:  General Exam: Comfortable.  Respiratory System: Clear. No increased work of breathing.  Cardiovascular System: First and second heart sounds heard. Regular rate and rhythm. No JVD/murmurs.  Gastrointestinal System: Abdomen is non distended, soft and normal bowel sounds heard. Nontender. Central Nervous System: Alert and oriented. No focal neurological deficits. Extremities: Symmetrical 5 x 5 power. Approximately 1 cm diameter mildly tender, firm subcutaneous knot -like area on the mid right calf but no other acute signs.  Labs:  Basic Metabolic Panel:  Lab 08/14/11 5784 08/13/11 1700 08/09/11 0345  NA 134* 137 139  K 4.1 3.1* 3.3*  CL 99 94* 101  CO2 20 24 26   GLUCOSE 99 100* 102*  BUN 19 17 11   CREATININE 1.45* 1.28* 1.17*  CALCIUM 6.7* 7.0* 7.5*  ALB -- -- --  PHOS -- -- --   Liver Function  Tests:  Lab 08/09/11 0345  AST 24  ALT 16  ALKPHOS 100  BILITOT 0.4  PROT 6.2  ALBUMIN 3.0*   No results found for this basename: LIPASE:3,AMYLASE:3 in the last 168 hours No results found for this basename: AMMONIA:3 in the last 168 hours CBC:  Lab 08/14/11 0542 08/13/11 1700 08/11/11 0347 08/10/11 0350 08/09/11 0345  WBC 5.2 3.1* 7.3 -- --  NEUTROABS -- 2.4 5.4 5.1 --  HGB 9.4* 18.1* 10.4* -- --  HCT 27.7* 50.9* 30.7* -- --  MCV 84.2 82.6 84.1 84.9 85.8  PLT 82* 37* 36* -- --   Cardiac Enzymes: No results found for this basename: CKTOTAL:5,CKMB:5,CKMBINDEX:5,TROPONINI:5 in the last 168 hours CBG:  Lab 08/14/11 0746 08/11/11 0810 08/10/11 1703 08/10/11 0743 08/09/11 0744  GLUCAP 142* 83 90 84 130*    Iron Studies: No results found for this basename: IRON,TIBC,TRANSFERRIN,FERRITIN in the last 72 hours Studies/Results: No results found. Medications:    . dextrose 5 % and 0.9 % NaCl with KCl 20 mEq/L 75 mL/hr at 08/13/11 2312      . aspirin EC  81 mg Oral Daily  . citalopram  40 mg Oral Daily  . dexamethasone  4 mg Oral BID WC  . diltiazem  240 mg Oral Daily  . folic acid  1  mg Oral Daily  . gemfibrozil  600 mg Oral QAC breakfast  . insulin aspart  0-15 Units Subcutaneous TID WC  . loratadine  10 mg Oral Daily  . losartan  50 mg Oral BID  . metronidazole  500 mg Intravenous Q8H  . ondansetron  4 mg Intravenous Once  . pantoprazole  40 mg Oral Q1200  . potassium chloride  10 mEq Intravenous Q1 Hr x 3  . potassium chloride SA  20 mEq Oral BID  . potassium chloride  40 mEq Oral Once  . sodium chloride  1,000 mL Intravenous Once  . sucralfate  1 g Oral QID  . DISCONTD: sodium chloride   Intravenous STAT    I  have reviewed scheduled and prn medications.     Problem/Plan: Principal Problem:  *Diarrhea Active Problems:  T1N0 AdenoCA of RUL  Dehydration  Hypokalemia  Thrombocytopenia  1. Acute diarrhea: Rule out C. difficile colitis. If C. difficile PCR  is negative then may be secondary to acute viral gastroenteritis versus antibiotic-induced diarrhea. Clinically seems to be improving. Advance diet as tolerated. 2. Dehydration: Improving. Continue IV fluids. Discontinue Flagyl and C. difficile PCR is negative. Minimal blood in stools-? Significance. Monitor. 3. Mild acute on stage III chronic kidney disease secondary to dehydration: Continue IV fluids and follow BMP tomorrow. 4. Hypokalemia: Repleted. 5. Recent pancytopenia from chemotherapy: Leukopenia resolved. Thrombocytopenia improving. Anemia is probably stable. Follow CBC tomorrow. 6. Type 2 diabetes mellitus: Sliding scale insulin. 7. Hypertension: Controlled. Continue losartan and diltiazem. 8. History of peripheral artery disease: Continue aspirin. 9. Hyperlipidemia: Continue home medications. 10. GERD: Continue PPIs. 11. Lung cancer: Outpatient followup with oncology. Continue steroids. 12. History of COPD: Stable.  Disposition: If C. difficile PCR is negative and patient's diarrhea improved, possible discharge home 5/5.   Jaide Hillenburg 08/14/2011,11:30 AM  LOS: 1 day

## 2011-08-15 DIAGNOSIS — R197 Diarrhea, unspecified: Secondary | ICD-10-CM | POA: Diagnosis not present

## 2011-08-15 DIAGNOSIS — E875 Hyperkalemia: Secondary | ICD-10-CM

## 2011-08-15 DIAGNOSIS — N179 Acute kidney failure, unspecified: Secondary | ICD-10-CM

## 2011-08-15 DIAGNOSIS — E860 Dehydration: Secondary | ICD-10-CM

## 2011-08-15 LAB — CBC
MCH: 29.1 pg (ref 26.0–34.0)
MCV: 83.8 fL (ref 78.0–100.0)
Platelets: 95 10*3/uL — ABNORMAL LOW (ref 150–400)
RDW: 15.4 % (ref 11.5–15.5)
WBC: 3.8 10*3/uL — ABNORMAL LOW (ref 4.0–10.5)

## 2011-08-15 LAB — BASIC METABOLIC PANEL
Chloride: 107 mEq/L (ref 96–112)
GFR calc Af Amer: 50 mL/min — ABNORMAL LOW (ref >90)
Potassium: 4.7 mEq/L (ref 3.5–5.1)

## 2011-08-15 LAB — COMPREHENSIVE METABOLIC PANEL
AST: 18 U/L (ref 0–37)
Albumin: 2.5 g/dL — ABNORMAL LOW (ref 3.5–5.2)
Calcium: 6.9 mg/dL — ABNORMAL LOW (ref 8.4–10.5)
Creatinine, Ser: 1.19 mg/dL — ABNORMAL HIGH (ref 0.50–1.10)
Total Protein: 6 g/dL (ref 6.0–8.3)

## 2011-08-15 LAB — GLUCOSE, CAPILLARY
Glucose-Capillary: 172 mg/dL — ABNORMAL HIGH (ref 70–99)
Glucose-Capillary: 164 mg/dL — ABNORMAL HIGH (ref 70–99)
Glucose-Capillary: 196 mg/dL — ABNORMAL HIGH (ref 70–99)

## 2011-08-15 MED ORDER — POTASSIUM CHLORIDE CRYS ER 20 MEQ PO TBCR
20.0000 meq | EXTENDED_RELEASE_TABLET | Freq: Two times a day (BID) | ORAL | Status: DC
  Administered 2011-08-16: 20 meq via ORAL
  Filled 2011-08-15 (×2): qty 1

## 2011-08-15 MED ORDER — LOSARTAN POTASSIUM 50 MG PO TABS
50.0000 mg | ORAL_TABLET | Freq: Two times a day (BID) | ORAL | Status: DC
  Administered 2011-08-16: 50 mg via ORAL
  Filled 2011-08-15 (×2): qty 1

## 2011-08-15 NOTE — Progress Notes (Signed)
Subjective: No N/V. Abdomen queezy. 2 BM's over last 24 hours. Onn and off cough with small streaks/clots of blood- ongoing for 3-4 weeks=following with Dr. Arbutus Ped.     Objective  Vital signs in last 24 hours: Filed Vitals:   08/14/11 1517 08/14/11 2000 08/14/11 2107 08/15/11 0621  BP: 114/49  126/62 138/57  Pulse: 84  77 78  Temp: 98.4 F (36.9 C)  97.5 F (36.4 C) 97.2 F (36.2 C)  TempSrc: Oral  Oral Oral  Resp: 18  18 20   Height:      Weight:    82.6 kg (182 lb 1.6 oz)  SpO2: 100% 100% 97% 98%   Weight change: 3.946 kg (8 lb 11.2 oz)  Intake/Output Summary (Last 24 hours) at 08/15/11 1503 Last data filed at 08/15/11 1000  Gross per 24 hour  Intake 3302.84 ml  Output   1000 ml  Net 2302.84 ml    Physical Exam:  General Exam: Comfortable.  Respiratory System: Clear. No increased work of breathing.  Cardiovascular System: First and second heart sounds heard. Regular rate and rhythm. No JVD/murmurs.  Gastrointestinal System: Abdomen is non distended, soft and normal bowel sounds heard. Nontender. Central Nervous System: Alert and oriented. No focal neurological deficits. Extremities: Symmetrical 5 x 5 power. Approximately 1 cm diameter mildly tender, firm subcutaneous knot -like area on the mid right calf but no other acute signs.  Labs:  Basic Metabolic Panel:  Lab 08/15/11 4540 08/15/11 0512 08/14/11 0542  NA 139 136 134*  K 4.7 5.2* 4.1  CL 107 107 99  CO2 21 18* 20  GLUCOSE 148* 220* 99  BUN 19 19 19   CREATININE 1.25* 1.19* 1.45*  CALCIUM 6.9* 6.9* 6.7*  ALB -- -- --  PHOS -- -- --   Liver Function Tests:  Lab 08/15/11 0512 08/09/11 0345  AST 18 24  ALT 13 16  ALKPHOS 64 100  BILITOT 0.3 0.4  PROT 6.0 6.2  ALBUMIN 2.5* 3.0*   No results found for this basename: LIPASE:3,AMYLASE:3 in the last 168 hours No results found for this basename: AMMONIA:3 in the last 168 hours CBC:  Lab 08/15/11 0512 08/14/11 0542 08/13/11 1700 08/11/11 0347 08/10/11  0350  WBC 3.8* 5.2 3.1* -- --  NEUTROABS -- -- 2.4 5.4 5.1  HGB 9.3* 9.4* 18.1* -- --  HCT 26.8* 27.7* 50.9* -- --  MCV 83.8 84.2 82.6 84.1 84.9  PLT 95* 82* 37* -- --   Cardiac Enzymes: No results found for this basename: CKTOTAL:5,CKMB:5,CKMBINDEX:5,TROPONINI:5 in the last 168 hours CBG:  Lab 08/15/11 1123 08/15/11 0722 08/14/11 2104 08/14/11 1729 08/14/11 1112  GLUCAP 164* 172* 118* 330* 100*    Iron Studies: No results found for this basename: IRON,TIBC,TRANSFERRIN,FERRITIN in the last 72 hours Studies/Results: No results found. Medications:    . DISCONTD: dextrose 5 % and 0.9 % NaCl with KCl 20 mEq/L 75 mL (08/14/11 2101)      . aspirin EC  81 mg Oral Daily  . citalopram  40 mg Oral Daily  . dexamethasone  4 mg Oral BID WC  . diltiazem  240 mg Oral Daily  . folic acid  1 mg Oral Daily  . gemfibrozil  600 mg Oral QAC breakfast  . insulin aspart  0-15 Units Subcutaneous TID WC  . loratadine  10 mg Oral Daily  . losartan  50 mg Oral BID  . metronidazole  500 mg Intravenous Q8H  . pantoprazole  40 mg Oral Q1200  .  potassium chloride SA  20 mEq Oral BID  . sucralfate  1 g Oral QID  . DISCONTD: losartan  50 mg Oral BID  . DISCONTD: potassium chloride SA  20 mEq Oral BID    I  have reviewed scheduled and prn medications.     Problem/Plan: Principal Problem:  *Diarrhea Active Problems:  T1N0 AdenoCA of RUL  Dehydration  Hypokalemia  Thrombocytopenia  1. Acute diarrhea: CDiff PCR neg. Clinically seems to be improving. Advance diet as tolerated. ? Acute viral GE. D/C Flagyl. 2. Dehydration: Improved= d/c IVF.  3. Hyperkalemia: ? 2/2 supplements= hold ARB and KCL for today. Resolved. 4. Mild acute on stage III chronic kidney disease secondary to dehydration: Improved. 5. Recent pancytopenia from chemotherapy:Stable. OP f/u with Oncology. 6. Type 2 diabetes mellitus: Sliding scale insulin. Reasonable inpatient control. 7. Hypertension: Controlled. Continue  losartan and diltiazem. 8. History of peripheral artery disease: Continue aspirin. 9. Hyperlipidemia: Continue home medications. 10. GERD: Continue PPIs. 11. Lung cancer: Outpatient followup with oncology. Continue steroids. 12. History of COPD: Stable. 13. Hemoptysis: OP f/U with Oncology.  Disposition: D/C home 5/6. D/W Oncology in am.   [name] 08/15/2011,3:03 PM  LOS: 2 days

## 2011-08-16 ENCOUNTER — Other Ambulatory Visit: Payer: Medicare Other | Admitting: Lab

## 2011-08-16 ENCOUNTER — Inpatient Hospital Stay (HOSPITAL_COMMUNITY): Admission: RE | Admit: 2011-08-16 | Payer: Medicare Other | Source: Ambulatory Visit

## 2011-08-16 ENCOUNTER — Telehealth: Payer: Self-pay | Admitting: Medical Oncology

## 2011-08-16 ENCOUNTER — Other Ambulatory Visit: Payer: Self-pay | Admitting: Medical Oncology

## 2011-08-16 ENCOUNTER — Other Ambulatory Visit (HOSPITAL_COMMUNITY): Payer: Medicare Other

## 2011-08-16 ENCOUNTER — Ambulatory Visit: Payer: Medicare Other | Admitting: Internal Medicine

## 2011-08-16 DIAGNOSIS — E875 Hyperkalemia: Secondary | ICD-10-CM | POA: Diagnosis not present

## 2011-08-16 DIAGNOSIS — N179 Acute kidney failure, unspecified: Secondary | ICD-10-CM

## 2011-08-16 DIAGNOSIS — E860 Dehydration: Secondary | ICD-10-CM

## 2011-08-16 DIAGNOSIS — R197 Diarrhea, unspecified: Secondary | ICD-10-CM

## 2011-08-16 LAB — BASIC METABOLIC PANEL
BUN: 22 mg/dL (ref 6–23)
Calcium: 7.2 mg/dL — ABNORMAL LOW (ref 8.4–10.5)
GFR calc Af Amer: 54 mL/min — ABNORMAL LOW (ref >90)
GFR calc non Af Amer: 47 mL/min — ABNORMAL LOW (ref >90)
Potassium: 4.9 mEq/L (ref 3.5–5.1)
Sodium: 139 mEq/L (ref 135–145)

## 2011-08-16 LAB — GLUCOSE, CAPILLARY: Glucose-Capillary: 144 mg/dL — ABNORMAL HIGH (ref 70–99)

## 2011-08-16 MED ORDER — CITALOPRAM HYDROBROMIDE 40 MG PO TABS: 20.0000 mg | ORAL_TABLET | Freq: Every day | ORAL | Status: DC

## 2011-08-16 MED ORDER — ALBUTEROL SULFATE (2.5 MG/3ML) 0.083% IN NEBU: 2.5000 mg | INHALATION_SOLUTION | Freq: Four times a day (QID) | RESPIRATORY_TRACT | Status: DC | PRN

## 2011-08-16 NOTE — Progress Notes (Signed)
Discharge instructions given to pt, verbalized understanding. Left the unit in stable condition. 

## 2011-08-16 NOTE — Telephone Encounter (Signed)
I called [facility] and made referral for home nebulizer

## 2011-08-16 NOTE — Progress Notes (Signed)
UR completed 

## 2011-08-16 NOTE — Progress Notes (Signed)
08-16-11 Spoke with Mrs Kubota at bedside. Lives alone but has a supportive dtr that lives in the area. Arranged appt with Dr Glade Lloyd as her new PCP for 09-02-11 at 2pm. Patient states she has dme at home. Has home 02 thru [facility]. She has portable o2 tank in room. Dtr to pick her up. Has aide at the home for several hrs a day. No further needs assessed. States she has several chemo sessions left.   [place], [place]  [phone]

## 2011-08-16 NOTE — Discharge Summary (Signed)
Discharge Summary  [name] MR#: [id number]  DOB:08-15-1945  Date of Admission: 08/13/2011 Date of Discharge: 08/16/2011  Patient's PCP: Does not have one.  Primary pulmonologist: [name]., MD, MD  Primary cardiologist: Dr. Tonny Bollman.  Primary oncologist: Dr. Si Gaul  Attending Physician:Marriana Hibberd  Consults: None  Discharge Diagnoses: Principal Problem:  *Diarrhea Active Problems:  T1N0 AdenoCA of RUL  Dehydration  Hypokalemia  Thrombocytopenia Hemoptysis  Brief Admitting History and Physical 66 y.o. female with history of adenocarcinoma of the lung, hypertension, restless leg syndrome, chronic sinusitis, type 2 diabetes, asthma, discharged from hospital 3 days prior to this admission. At that time she was admitted due to post chemotherapy pancytopenia. She is said to have received antibiotics during the hospital stay. She now presented with diarrhea which was actually present during the discharge but got worse at home. Has been no ill contact or distant travel. She has no bloody stool. She has some fever but no chills. Workup in emergency room included a potassium of 3.1, creatinine of 1.28, white count of 3.1 thousand, and hemoglobin of 18 g per decaliter. Her platelet count remains low at 37,000. Hospitalist was asked to admit her under observation as she was dehydrated. She also has nausea, and has not been eating adequately at home.   Discharge Medications Current Discharge Medication List    CONTINUE these medications which have CHANGED   Details  citalopram (CELEXA) 40 MG tablet Take 0.5 tablets (20 mg total) by mouth daily.      CONTINUE these medications which have NOT CHANGED   Details  albuterol (PROAIR HFA) 108 (90 BASE) MCG/ACT inhaler Inhale 2 puffs into the lungs every 6 (six) hours as needed. For shortness of breath Qty: 1 Inhaler, Refills: 2    albuterol-ipratropium (COMBIVENT) 18-103 MCG/ACT inhaler Inhale 2 puffs into the  lungs every 6 (six) hours as needed. For shortness of breath    ALPRAZolam (XANAX) 1 MG tablet Take 1 mg by mouth 3 (three) times daily as needed. For anxiety    Alum & Mag Hydroxide-Simeth (MAGIC MOUTHWASH W/LIDOCAINE) SOLN Take 5 mLs by mouth 4 (four) times daily as needed. Qty: 200 mL, Refills: 0   Associated Diagnoses: Mucositis (ulcerative) due to antineoplastic therapy    aspirin EC 325 MG tablet Hold for the next 4 days then resume taking one tablet by mouth daily Qty: 30 tablet, Refills: 1    dexamethasone (DECADRON) 4 MG tablet Take 4 mg by mouth 2 (two) times daily with a meal. Take 1 tablet twice a day the day before, the day of and the day after chemotherapy.     diltiazem (CARDIZEM CD) 240 MG 24 hr capsule Take 1 capsule (240 mg total) by mouth daily. Qty: 30 capsule, Refills: 6    folic acid (FOLVITE) 1 MG tablet Take 1 mg by mouth daily.    furosemide (LASIX) 20 MG tablet Take 1 tablet (20 mg total) by mouth daily. Qty: 3 tablet, Refills: 0    gemfibrozil (LOPID) 600 MG tablet Take 600 mg by mouth daily.     HYDROcodone-acetaminophen (VICODIN ES) 7.5-750 MG per tablet Take 1 tablet by mouth every 6 (six) hours as needed. For severe pain    loratadine (CLARITIN) 10 MG tablet Take 1 tablet (10 mg total) by mouth daily. Qty: 30 tablet, Refills: 3    losartan (COZAAR) 50 MG tablet Take 1 tablet (50 mg total) by mouth 2 (two) times daily. Qty: 60 tablet, Refills: 6  metFORMIN (GLUCOPHAGE) 500 MG tablet Take 500 mg by mouth 2 (two) times daily with a meal.     pantoprazole (PROTONIX) 40 MG tablet Take 1 tablet (40 mg total) by mouth daily. Qty: 30 tablet, Refills: 1   Associated Diagnoses: Malignant neoplasm of upper lobe, bronchus or lung; Malignant neoplasm of bronchus and lung, unspecified site    polyethylene glycol (MIRALAX / GLYCOLAX) packet Take 17 g by mouth daily as needed. For constipation   Associated Diagnoses: Non-small cell lung cancer    potassium  chloride SA (K-DUR,KLOR-CON) 20 MEQ tablet Take 1 tablet (20 mEq total) by mouth 2 (two) times daily. Qty: 60 tablet, Refills: 6    PRESCRIPTION MEDICATION Receiving chemotherapy at [facility].  Regimen is Carboplatin for AUC 5 and Alimta 500mg /m*2 given every 3 weeks.   Last treament was 07-21-11; next scheduled for 08-11-11.  Oncologist is Dr. Arbutus Ped. Pt did not get her chemo treatment on 08-11-11    prochlorperazine (COMPAZINE) 10 MG tablet Take 10 mg by mouth every 6 (six) hours as needed. For nausea    senna (SENOKOT) 8.6 MG TABS Take 1 tablet by mouth daily as needed. For constipation    traMADol (ULTRAM) 50 MG tablet Take 50 mg by mouth every 4 (four) hours as needed. For moderate pain    triamcinolone ointment (KENALOG) 0.1 % Apply topically 2 (two) times daily.   Associated Diagnoses: Non-small cell lung cancer    triamterene-hydrochlorothiazide (MAXZIDE-25) 37.5-25 MG per tablet Take 1 tablet by mouth daily.    levalbuterol (XOPENEX) 0.63 MG/3ML nebulizer solution Take 3 mLs (0.63 mg total) by nebulization every 8 (eight) hours as needed for wheezing or shortness of breath. Qty: 90 mL, Refills: 1   Comments: Dispense as 30 3 ml vials, one vial inhaled via nebulizer every 8 hours as needed for shortness of breath or wheezing    NON FORMULARY Oxygen 2L prn   Associated Diagnoses: Non-small cell lung cancer    sucralfate (CARAFATE) 1 G tablet Take 1 tablet (1 g total) by mouth 4 (four) times daily. Dissolve in 15 ml water. Qty: 90 tablet, Refills: 1      STOP taking these medications     triamcinolone (KENALOG) 0.025 % ointment Comments:  Reason for Stopping:          Hospital Course: Diarrhea Present on Admission:  .T1N0 AdenoCA of RUL .Dehydration   1. Acute diarrhea: Admitted to hospital. C. difficile PCR was negative. She was started on liquid diet which was advanced and she has tolerated the same. She indicates that she feels a whole lot better. No further diarrhea,  abdominal pain or nausea or vomiting. Unclear as to etiology of the diarrhea. Possibilities include acute viral gastroenteritis versus recent antibiotic exposure. She had been empirically started on Flagyl which was discontinued once of C. difficile PCR was negative. 2. Dehydration: secondary to diarrhea. Diuretics were temporarily held. She was hydrated with IV fluids and this has resolved. 3. Mild acute on stage III chronic kidney disease secondary to dehydration: Resolved after hydration. 4. Hypokalemia: Repleted. Then she briefly became hyperkalemic possibly secondary to potassium supplements. Potassium levels are now normal. 5. Recent pancytopenia from chemotherapy: Improved and stable. 6. Type 2 diabetes mellitus: Resume home metformin. Inpatient she was on sliding scale insulin with reasonable control. 7. Hypertension: Controlled. Continue diuretics, losartan and diltiazem. Controlled. 8. History of peripheral artery disease: Continue aspirin. 9. Hyperlipidemia: Continue home medications. 10. GERD: Continue PPIs. 11. Lung cancer: Outpatient followup with oncology.  Continue steroids. Patient indicates that she has intermittent minimal hemoptysis ongoing for the last couple of weeks which her oncologists are aware of and are evaluating as outpatient. She was scheduled to have some form of imaging this afternoon. Discussed with oncology team who will reschedule the same. They will arrange for outpatient follow. 12. History of COPD: Stable. 13. History of depression: As per pharmacist recommendations her Celexa dose has been reduced to 20 mg daily secondary to her age greater than 60 years and risk for prolonged QT and arrhythmia risk.     Day of Discharge  Complaints:  Patient denies any chest pain or dyspnea or nausea, vomiting or abdominal pain or diarrhea. Minimal intermittent cough with brownish/small clots of blood at times.  BP 111/45  Pulse 69  Temp(Src) 97.6 F (36.4 C) (Oral)   Resp 18  Ht 5\' 6"  (1.676 m)  Wt 82.7 kg (182 lb 5.1 oz)  BMI 29.43 kg/m2  SpO2 100%  General Exam: Comfortable.  Respiratory System: Clear. No increased work of breathing.  Cardiovascular System: First and second heart sounds heard. Regular rate and rhythm. No JVD/murmurs.  Gastrointestinal System: Abdomen is non distended, soft and normal bowel sounds heard. Nontender.  Central Nervous System: Alert and oriented. No focal neurological deficits.  Extremities: Symmetrical 5 x 5 power. Approximately 1 cm diameter mildly tender, firm subcutaneous knot -like area on the mid right calf but no other acute signs.  Basic Metabolic Panel:  Lab 08/16/11 7829 08/15/11 1213 08/15/11 0512  NA 139 139 136  K 4.9 4.7 5.2*  CL 107 107 107  CO2 20 21 18*  GLUCOSE 200* 148* 220*  BUN 22 19 19   CREATININE 1.18* 1.25* 1.19*  CALCIUM 7.2* 6.9* 6.9*  ALB -- -- --  PHOS -- -- --   Liver Function Tests:  Lab 08/15/11 0512  AST 18  ALT 13  ALKPHOS 64  BILITOT 0.3  PROT 6.0  ALBUMIN 2.5*   No results found for this basename: LIPASE:3,AMYLASE:3 in the last 168 hours No results found for this basename: AMMONIA:3 in the last 168 hours CBC:  Lab 08/15/11 0512 08/14/11 0542 08/13/11 1700 08/11/11 0347 08/10/11 0350  WBC 3.8* 5.2 3.1* -- --  NEUTROABS -- -- 2.4 5.4 5.1  HGB 9.3* 9.4* 18.1* -- --  HCT 26.8* 27.7* 50.9* -- --  MCV 83.8 84.2 82.6 84.1 84.9  PLT 95* 82* 37* -- --   Cardiac Enzymes: No results found for this basename: CKTOTAL:5,CKMB:5,CKMBINDEX:5,TROPONINI:5 in the last 168 hours CBG:  Lab 08/16/11 1144 08/16/11 0744 08/15/11 2117 08/15/11 1655 08/15/11 1123  GLUCAP 241* 144* 196* 165* 164*   Other lab data:  1. C. difficile by PCR: Negative 2. Stool culture: Preliminary result shows abundant yeast and reduced normal flora.   Disposition: Discharged home in stable condition.  Diet: Heart healthy and diabetic.  Activity: Increase activity gradually.  Follow-up  Appts: Discharge Orders    Future Appointments: Provider: Department: Dept Phone: Center:   08/25/2011 12:30 PM Mauri Brooklyn [facility] [phone] None   09/01/2011 9:45 AM Delcie Roch [facility] [phone] None   09/01/2011 10:15 AM [facility] [phone] None   09/01/2011 11:15 AM Anabel Bene, RD [facility] [phone] None     Future Orders Please Complete By Expires   Diet - low sodium heart healthy      Diet Carb Modified      Increase activity slowly      Discharge  instructions      Comments:   Continue Oxygen at previous home dose.   Call MD for:  difficulty breathing, headache or visual disturbances         TESTS THAT NEED FOLLOW-UP Periodic CBC followup as per the oncologist recommendation.  Time spent on discharge, talking to the patient, and coordinating care: 35 mins.   SignedMarcellus Scott, MD 08/16/2011, 2:38 PM

## 2011-08-17 ENCOUNTER — Telehealth: Payer: Self-pay | Admitting: *Deleted

## 2011-08-17 ENCOUNTER — Ambulatory Visit: Payer: Medicare Other | Admitting: Internal Medicine

## 2011-08-17 ENCOUNTER — Telehealth: Payer: Self-pay | Admitting: Internal Medicine

## 2011-08-17 ENCOUNTER — Other Ambulatory Visit: Payer: Self-pay | Admitting: Physician Assistant

## 2011-08-17 ENCOUNTER — Ambulatory Visit: Payer: Medicare Other

## 2011-08-17 ENCOUNTER — Other Ambulatory Visit: Payer: Medicare Other | Admitting: Lab

## 2011-08-17 ENCOUNTER — Other Ambulatory Visit: Payer: Self-pay | Admitting: Medical Oncology

## 2011-08-17 DIAGNOSIS — M47817 Spondylosis without myelopathy or radiculopathy, lumbosacral region: Secondary | ICD-10-CM | POA: Diagnosis not present

## 2011-08-17 DIAGNOSIS — C3410 Malignant neoplasm of upper lobe, unspecified bronchus or lung: Secondary | ICD-10-CM

## 2011-08-17 DIAGNOSIS — M159 Polyosteoarthritis, unspecified: Secondary | ICD-10-CM | POA: Diagnosis not present

## 2011-08-17 DIAGNOSIS — C3490 Malignant neoplasm of unspecified part of unspecified bronchus or lung: Secondary | ICD-10-CM

## 2011-08-17 LAB — STOOL CULTURE

## 2011-08-17 NOTE — Telephone Encounter (Signed)
pt aware of her appts    aom °

## 2011-08-17 NOTE — Telephone Encounter (Signed)
Per staff message from [place], I have scheduled treatment appt to follow MD visit on 5/15. Anne aware appt in computer.  JMW

## 2011-08-17 NOTE — Telephone Encounter (Signed)
Pt calling to see if medication was being filled at [facility]

## 2011-08-18 ENCOUNTER — Telehealth: Payer: Self-pay | Admitting: Internal Medicine

## 2011-08-18 ENCOUNTER — Other Ambulatory Visit: Payer: Medicare Other | Admitting: Lab

## 2011-08-18 NOTE — Telephone Encounter (Signed)
08/18/11- Pt received nebulizer machine. Still has some slight hemoptysis. I told her if worsens to go to ED-she voices udnerstanding.

## 2011-08-18 NOTE — Telephone Encounter (Signed)
called pt and moved appt to 11:45

## 2011-08-23 ENCOUNTER — Ambulatory Visit (HOSPITAL_COMMUNITY)
Admission: RE | Admit: 2011-08-23 | Discharge: 2011-08-23 | Disposition: A | Discharge status: Home / Self Care | Payer: Medicare HMO | Source: Ambulatory Visit | Attending: Physician Assistant | Admitting: Physician Assistant

## 2011-08-23 DIAGNOSIS — C3410 Malignant neoplasm of upper lobe, unspecified bronchus or lung: Secondary | ICD-10-CM

## 2011-08-23 DIAGNOSIS — R059 Cough, unspecified: Secondary | ICD-10-CM | POA: Insufficient documentation

## 2011-08-23 DIAGNOSIS — R0602 Shortness of breath: Secondary | ICD-10-CM | POA: Insufficient documentation

## 2011-08-23 DIAGNOSIS — E278 Other specified disorders of adrenal gland: Secondary | ICD-10-CM | POA: Insufficient documentation

## 2011-08-23 DIAGNOSIS — R05 Cough: Secondary | ICD-10-CM | POA: Insufficient documentation

## 2011-08-23 DIAGNOSIS — K8020 Calculus of gallbladder without cholecystitis without obstruction: Secondary | ICD-10-CM | POA: Insufficient documentation

## 2011-08-23 DIAGNOSIS — C3490 Malignant neoplasm of unspecified part of unspecified bronchus or lung: Secondary | ICD-10-CM | POA: Insufficient documentation

## 2011-08-24 ENCOUNTER — Telehealth: Payer: Self-pay | Admitting: *Deleted

## 2011-08-24 NOTE — Telephone Encounter (Signed)
Pt called stating that she has been feeling very bad with nausea, has a cold, is very congested, and had a fever last night of 100.2.  Asked pt to take temp today and she stated she cannot get her thermometer to work.  She has not been taking any nausea meds today to help with the nausea.  Encouraged her to take her compazine.  She also stated her RN aide is coming to the house this afternoon and she will check her temp.  She will call back if her temp is elevated.  Otherwise she will come in and be assess by Dr Donnald Garre at f/u appt tomorrow.  Dr Donnald Garre aware.  SLJ

## 2011-08-25 ENCOUNTER — Ambulatory Visit: Payer: Medicare Other

## 2011-08-25 ENCOUNTER — Telehealth: Payer: Self-pay | Admitting: Internal Medicine

## 2011-08-25 ENCOUNTER — Other Ambulatory Visit: Payer: Self-pay | Admitting: *Deleted

## 2011-08-25 ENCOUNTER — Ambulatory Visit (HOSPITAL_BASED_OUTPATIENT_CLINIC_OR_DEPARTMENT_OTHER): Payer: Medicare Other | Admitting: Internal Medicine

## 2011-08-25 ENCOUNTER — Other Ambulatory Visit: Payer: Medicare Other | Admitting: Lab

## 2011-08-25 VITALS — BP 134/73 | HR 101 | Temp 97.2°F | Ht 66.0 in | Wt 167.3 lb

## 2011-08-25 DIAGNOSIS — C3490 Malignant neoplasm of unspecified part of unspecified bronchus or lung: Secondary | ICD-10-CM

## 2011-08-25 DIAGNOSIS — J449 Chronic obstructive pulmonary disease, unspecified: Secondary | ICD-10-CM

## 2011-08-25 DIAGNOSIS — C3410 Malignant neoplasm of upper lobe, unspecified bronchus or lung: Secondary | ICD-10-CM

## 2011-08-25 LAB — CBC WITH DIFFERENTIAL/PLATELET
Basophils Absolute: 0 10*3/uL (ref 0.0–0.1)
EOS%: 0 % (ref 0.0–7.0)
Eosinophils Absolute: 0 10*3/uL (ref 0.0–0.5)
HCT: 26.2 % — ABNORMAL LOW (ref 34.8–46.6)
HGB: 9 g/dL — ABNORMAL LOW (ref 11.6–15.9)
MCH: 29.3 pg (ref 25.1–34.0)
MCV: 85.3 fL (ref 79.5–101.0)
NEUT%: 91.5 % — ABNORMAL HIGH (ref 38.4–76.8)
lymph#: 0.5 10*3/uL — ABNORMAL LOW (ref 0.9–3.3)

## 2011-08-25 MED ORDER — IPRATROPIUM-ALBUTEROL 18-103 MCG/ACT IN AERO: 2.0000 | INHALATION_SPRAY | Freq: Four times a day (QID) | RESPIRATORY_TRACT | Status: DC | PRN

## 2011-08-25 MED ORDER — IPRATROPIUM-ALBUTEROL 18-103 MCG/ACT IN AERO: 1.0000 | INHALATION_SPRAY | Freq: Four times a day (QID) | RESPIRATORY_TRACT | Status: DC | PRN

## 2011-08-25 NOTE — Telephone Encounter (Signed)
appts made and printed for pt aom °

## 2011-08-25 NOTE — Progress Notes (Signed)
[facility] [phone]   [phone]  OFFICE PROGRESS NOTE  [name]., MD, MD [address] [employer] [place] [address]  DIAGNOSIS: Stage IIB/IIIA non-small cell lung cancer consistent with adenocarcinoma diagnosed in December 2012.   PRIOR THERAPY: status post radiation therapy to the right upper lobe lung mass completed on 06/04/2011 under the care of Dr. Michell Heinrich.   CURRENT THERAPY: Systemic chemotherapy with carboplatin for AUC of 5 and Alimta 500 mg/M2 giving every 3 weeks, status post 3 cycles.  INTERVAL HISTORY: MEDA DUDZINSKI 66 y.o. female returns to the clinic today for followup visit accompanied by her daughter Lora Havens. The patient is feeling much better today except for mild fatigue and shortness breath with exertion. She denied having any significant fever or chills, no nausea or vomiting. She has no chest pain. She was hospitalized twice recently secondary to severe pancytopenia first admission and second admission was for diarrhea. These completely resolved at this point. The patient has repeat CT scan of the chest performed recently and she is here for evaluation and discussion of her scan results.  MEDICAL HISTORY: Past Medical History  Diagnosis Date  . Polyneuropathy in diabetes   . Restless legs syndrome (RLS)   . Chronic sinusitis   . Hypertension   . Hyperlipidemia   . Type II or unspecified type diabetes mellitus without mention of complication, not stated as uncontrolled   . Anemia, unspecified   . GERD (gastroesophageal reflux disease)   . Respiratory failure with hypoxia 01/02/2010  . Complication of anesthesia     slow to wake up  . Adenocarcinoma, lung     upper left lobe  . COPD (chronic obstructive pulmonary disease)   . Emphysema   . Asthma   . Coronary artery disease     Dr. Excell Seltzer had stress test done  . Dysrhythmia     unsure what type  . Peripheral vascular disease   .  Non-small cell lung cancer 03/31/2011    Adenocarcinoma Rt upper lobe mass    ALLERGIES:  is allergic to iohexol; codeine; and penicillins.  MEDICATIONS:  Current Outpatient Prescriptions  Medication Sig Dispense Refill  . folic acid (FOLVITE) 1 MG tablet Take 1 mg by mouth daily.      . furosemide (LASIX) 20 MG tablet Take 1 tablet (20 mg total) by mouth daily.  3 tablet  0  . gemfibrozil (LOPID) 600 MG tablet Take 600 mg by mouth daily.       Marland Kitchen HYDROcodone-acetaminophen (VICODIN ES) 7.5-750 MG per tablet Take 1 tablet by mouth every 6 (six) hours as needed. For severe pain      . loratadine (CLARITIN) 10 MG tablet Take 1 tablet (10 mg total) by mouth daily.  30 tablet  3  . losartan (COZAAR) 50 MG tablet Take 1 tablet (50 mg total) by mouth 2 (two) times daily.  60 tablet  6  . metFORMIN (GLUCOPHAGE) 500 MG tablet Take 500 mg by mouth 2 (two) times daily with a meal.       . NON FORMULARY Oxygen 2L prn      . pantoprazole (PROTONIX) 40 MG tablet Take 1 tablet (40 mg total) by mouth daily.  30 tablet  1  . polyethylene glycol (MIRALAX / GLYCOLAX) packet Take 17 g by mouth daily as needed. For constipation      . potassium chloride SA (K-DUR,KLOR-CON) 20 MEQ tablet Take 1 tablet (20 mEq  total) by mouth 2 (two) times daily.  60 tablet  6  . PRESCRIPTION MEDICATION Receiving chemotherapy at [facility].  Regimen is Carboplatin for AUC 5 and Alimta 500mg /m*2 given every 3 weeks.   Last treament was 07-21-11; next scheduled for 08-11-11.  Oncologist is Dr. Arbutus Ped. Pt did not get her chemo treatment on 08-11-11      . prochlorperazine (COMPAZINE) 10 MG tablet Take 10 mg by mouth every 6 (six) hours as needed. For nausea      . senna (SENOKOT) 8.6 MG TABS Take 1 tablet by mouth daily as needed. For constipation      . sucralfate (CARAFATE) 1 G tablet Take 1 tablet (1 g total) by mouth 4 (four) times daily. Dissolve in 15 ml water.  90 tablet  1  . traMADol (ULTRAM) 50 MG tablet Take 50 mg by mouth every 4  (four) hours as needed. For moderate pain      . triamcinolone ointment (KENALOG) 0.1 % Apply topically 2 (two) times daily.      Marland Kitchen triamterene-hydrochlorothiazide (MAXZIDE-25) 37.5-25 MG per tablet Take 1 tablet by mouth daily.      Marland Kitchen albuterol (PROVENTIL) (2.5 MG/3ML) 0.083% nebulizer solution Take 3 mLs (2.5 mg total) by nebulization every 6 (six) hours as needed for wheezing or shortness of breath.      Marland Kitchen albuterol-ipratropium (COMBIVENT) 18-103 MCG/ACT inhaler Inhale 2 puffs into the lungs every 6 (six) hours as needed. For shortness of breath  2 Inhaler  2  . ALPRAZolam (XANAX) 1 MG tablet Take 1 mg by mouth 3 (three) times daily as needed. For anxiety      . Alum & Mag Hydroxide-Simeth (MAGIC MOUTHWASH W/LIDOCAINE) SOLN Take 5 mLs by mouth 4 (four) times daily as needed.  200 mL  0  . aspirin EC 325 MG tablet Hold for the next 4 days then resume taking one tablet by mouth daily  30 tablet  1  . citalopram (CELEXA) 40 MG tablet Take 0.5 tablets (20 mg total) by mouth daily.      Marland Kitchen dexamethasone (DECADRON) 4 MG tablet Take 4 mg by mouth 2 (two) times daily with a meal. Take 1 tablet twice a day the day before, the day of and the day after chemotherapy.       . diltiazem (CARDIZEM CD) 240 MG 24 hr capsule Take 1 capsule (240 mg total) by mouth daily.  30 capsule  6  . DISCONTD: albuterol-ipratropium (COMBIVENT) 18-103 MCG/ACT inhaler Inhale 2 puffs into the lungs every 6 (six) hours as needed. For shortness of breath      . DISCONTD: calcium carbonate 200 MG capsule Take 250 mg by mouth daily. Does not take everyday       No current facility-administered medications for this visit.   Facility-Administered Medications Ordered in Other Visits  Medication Dose Route Frequency Provider Last Rate Last Dose  . hyaluronate sodium (RADIAPLEXRX) gel   Topical Once Lurline Hare, MD      . topical emolient (BIAFINE) emulsion   Topical PRN Lurline Hare, MD        SURGICAL HISTORY:  Past Surgical  History  Procedure Date  . Lung lobectomy     left  . Septoplasty   . Total hip arthroplasty 01/2010    left  . Bronchoscopy 02/2011  . Tubal ligation     REVIEW OF SYSTEMS:  A comprehensive review of systems was negative except for: Constitutional: positive for fatigue Respiratory: positive for cough, dyspnea  on exertion and wheezing   PHYSICAL EXAMINATION: General appearance: alert, cooperative and no distress Head: Normocephalic, without obvious abnormality, atraumatic Neck: no adenopathy Lymph nodes: Cervical, supraclavicular, and axillary nodes normal. Resp: Wheezes bilaterally Cardio: regular rate and rhythm, S1, S2 normal, no murmur, click, rub or gallop GI: soft, non-tender; bowel sounds normal; no masses,  no organomegaly Extremities: extremities normal, atraumatic, no cyanosis or edema Neurologic: Alert and oriented X 3, normal strength and tone. Normal symmetric reflexes. Normal coordination and gait  ECOG PERFORMANCE STATUS: 2 - Symptomatic, <50% confined to bed  Blood pressure 134/73, pulse 101, temperature 97.2 F (36.2 C), temperature source Oral, height 5\' 6"  (1.676 m), weight 167 lb 4.8 oz (75.887 kg).  LABORATORY DATA: Lab Results  Component Value Date   WBC 13.0* 08/25/2011   HGB 9.0* 08/25/2011   HCT 26.2* 08/25/2011   MCV 85.3 08/25/2011   PLT 258 08/25/2011      Chemistry      Component Value Date/Time   NA 139 08/16/2011 0446   K 4.9 08/16/2011 0446   CL 107 08/16/2011 0446   CO2 20 08/16/2011 0446   BUN 22 08/16/2011 0446   CREATININE 1.18* 08/16/2011 0446      Component Value Date/Time   CALCIUM 7.2* 08/16/2011 0446   ALKPHOS 64 08/15/2011 0512   AST 18 08/15/2011 0512   ALT 13 08/15/2011 0512   BILITOT 0.3 08/15/2011 0512       RADIOGRAPHIC STUDIES: Ct Chest Wo Contrast  08/23/2011  *RADIOLOGY REPORT*  Clinical Data: Restaging lung cancer, left side diagnosed 1986, right side diagnosed 2012.  Completed radiation therapy.  Ongoing chemotherapy.  Cough and  shortness of breath.  CT CHEST WITHOUT CONTRAST  Technique:  Multidetector CT imaging of the chest was performed following the standard protocol without IV contrast.  Comparison: Chest radiograph 08/08/2011, most recent CT 03/29/2011  Findings: Right upper lobe mass measures 2.7 x 2.3 cm image 19 (previously 3.5 x 2.4 cm).  Ill-defined right middle lobe mass like opacity is also smaller, now 2.8 x 2.3 cm (previously 4.2 x 4.0 cm), on current image 34.  There are new areas of increased reticular markings predominately within the right greater than left bilateral upper lobe and superior segment right lower lobe.  In general, there is an approximate linear configuration which could correspond to a radiation portal encompassing the area.  Right greater than left pleural fluid or thickening is stable.  Left lower lobe bleb again noted.  No new pulmonary nodule or mass is identified.  This could be obscured by the presence of reticular markings, predominately in the region of the superior segment right lower lobe, for example image 25.  Previously measured pretracheal node is smaller, now 0.6 cm image 22 compared to 0.9 cm previously.  Trace superior pericardial recess fluid is identified.  Coronary arterial calcifications are noted.  Heart size is normal.  No other lymphadenopathy, allowing for decreased conspicuity given lack of IV contrast.  Great vessels are normal in caliber.  No axillary lymphadenopathy.  Small esophageal air-fluid level without esophageal wall thickening.  Incomplete imaging of the upper abdomen demonstrates minimal left adrenal nodularity greater than right, but no new focal measurable mass lesion.  Vascular calcifications are present.  Gallstones without CT evidence for acute cholecystitis.  No new lytic or sclerotic osseous lesion.  IMPRESSION: Decrease in size of dominant right upper lobe and right middle lobe masses, compatible with response to treatment, but with interval development of  confluent  predominately right upper lobe increased reticular markings likely conforming to the expected position of radiation portal/fibrosis.  Other causes of pneumonitis could have a similar appearance, and lymphangitic spread of tumor is also within the differential consideration.  Stable bilateral adrenal nodularity without new lesion or change to suggest metastatic disease.  Gallstones without CT evidence for cholecystitis.  Original Report Authenticated By: Harrel Lemon, M.D.   Dg Chest Port 1 View  08/08/2011  *RADIOLOGY REPORT*  Clinical Data: Shortness of breath  PORTABLE CHEST - 1 VIEW  Comparison: 07/04/2011  Findings: Right suprahilar mass is more prominent on today's examination.  In addition, there is increased interstitial prominence, right greater than left.  Status post left lower lobectomy in.  No pneumothorax.  No definite pleural effusion. Cardiac contour within normal limits.  No acute osseous abnormality identified.  IMPRESSION: Right suprahilar mass is more pronounced on today's examination. Increased interstitial prominence may reflect edema, atypical infection, or lymphangitic spread of disease.  Original Report Authenticated By: Waneta Martins, M.D.    ASSESSMENT: This is a very pleasant 65 years old white female with history of stage IIb non-small cell lung cancer status post curative radiotherapy to the right upper lobe lung mass followed by 3 cycles of systemic chemotherapy with carboplatin and Alimta. The patient is doing fine and she has evidence for disease improvement. She has rough time with the last cycle of the chemotherapy with severe pancytopenia.   PLAN: I have a lengthy discussion with the patient and her daughter today about her current condition and treatment options. I recommended for her to discontinue chemotherapy at this point and I will continue to monitor her closely. I would see her back for followup visit in 2 months with repeat CT scan of the chest  with contrast. For COPD, I started the patient on Combivent inhaler when necessary in addition to her nebulizer. She was advised to call me immediately if she has any concerning symptoms in the interval.  All questions were answered. The patient knows to call the clinic with any problems, questions or concerns. We can certainly see the patient much sooner if necessary.  I spent 15 minutes counseling the patient face to face. The total time spent in the appointment was 25 minutes.

## 2011-08-26 ENCOUNTER — Ambulatory Visit: Payer: Medicare Other | Admitting: Radiation Oncology

## 2011-08-30 ENCOUNTER — Other Ambulatory Visit: Payer: Self-pay | Admitting: Cardiovascular Disease

## 2011-08-30 MED ORDER — TRIAMTERENE-HCTZ 37.5-25 MG PO TABS: 1.0000 | ORAL_TABLET | Freq: Every day | ORAL | Status: DC

## 2011-08-30 NOTE — Telephone Encounter (Signed)
Refill- triamterene-hydrochlorothiazide (MAXZIDE-25) 37.5-25 MG per tablet          - Citalopram (CELEXA) 40 MG tablet         - ALPRAZolam (XANAX) 1 MG tablet  [facility]

## 2011-08-31 ENCOUNTER — Telehealth: Payer: Self-pay | Admitting: Medical Oncology

## 2011-08-31 NOTE — Telephone Encounter (Signed)
Requesting additional hours for personal care. Form to Dr Donnald Garre .

## 2011-08-31 NOTE — Telephone Encounter (Signed)
Myrene Buddy will call pt to have Dr Galen Daft at [facility] evaluate need for increased hours for personal aide. Pt has new pt appointment with Dr Galen Daft this week

## 2011-09-01 ENCOUNTER — Encounter: Payer: Medicare Other | Admitting: Nutrition

## 2011-09-01 ENCOUNTER — Telehealth: Payer: Self-pay | Admitting: Medical Oncology

## 2011-09-01 ENCOUNTER — Ambulatory Visit: Payer: Medicare Other

## 2011-09-01 ENCOUNTER — Other Ambulatory Visit: Payer: Medicare Other | Admitting: Lab

## 2011-09-01 DIAGNOSIS — E1149 Type 2 diabetes mellitus with other diabetic neurological complication: Secondary | ICD-10-CM | POA: Diagnosis not present

## 2011-09-01 DIAGNOSIS — I70209 Unspecified atherosclerosis of native arteries of extremities, unspecified extremity: Secondary | ICD-10-CM | POA: Diagnosis not present

## 2011-09-01 DIAGNOSIS — C3490 Malignant neoplasm of unspecified part of unspecified bronchus or lung: Secondary | ICD-10-CM | POA: Diagnosis not present

## 2011-09-01 DIAGNOSIS — G609 Hereditary and idiopathic neuropathy, unspecified: Secondary | ICD-10-CM | POA: Diagnosis not present

## 2011-09-01 NOTE — Telephone Encounter (Signed)
Erroneous encounter

## 2011-09-14 DIAGNOSIS — C3490 Malignant neoplasm of unspecified part of unspecified bronchus or lung: Secondary | ICD-10-CM | POA: Diagnosis not present

## 2011-09-14 DIAGNOSIS — F411 Generalized anxiety disorder: Secondary | ICD-10-CM | POA: Diagnosis not present

## 2011-09-14 DIAGNOSIS — E785 Hyperlipidemia, unspecified: Secondary | ICD-10-CM | POA: Diagnosis not present

## 2011-09-14 DIAGNOSIS — E1149 Type 2 diabetes mellitus with other diabetic neurological complication: Secondary | ICD-10-CM | POA: Diagnosis not present

## 2011-09-14 DIAGNOSIS — M899 Disorder of bone, unspecified: Secondary | ICD-10-CM | POA: Diagnosis not present

## 2011-09-14 DIAGNOSIS — H9190 Unspecified hearing loss, unspecified ear: Secondary | ICD-10-CM | POA: Diagnosis not present

## 2011-09-15 ENCOUNTER — Encounter: Payer: Self-pay | Admitting: Neurosurgery

## 2011-09-15 ENCOUNTER — Emergency Department (HOSPITAL_COMMUNITY): Payer: Medicare Other

## 2011-09-15 ENCOUNTER — Encounter (HOSPITAL_COMMUNITY): Payer: Self-pay

## 2011-09-15 ENCOUNTER — Inpatient Hospital Stay (HOSPITAL_COMMUNITY)
Admission: EM | Admit: 2011-09-15 | Discharge: 2011-09-17 | DRG: 812 | Disposition: A | Discharge status: Home / Self Care | Payer: Medicare Other | Attending: Internal Medicine | Admitting: Internal Medicine

## 2011-09-15 DIAGNOSIS — R0602 Shortness of breath: Secondary | ICD-10-CM | POA: Diagnosis not present

## 2011-09-15 DIAGNOSIS — D6481 Anemia due to antineoplastic chemotherapy: Principal | ICD-10-CM | POA: Diagnosis present

## 2011-09-15 DIAGNOSIS — J4489 Other specified chronic obstructive pulmonary disease: Secondary | ICD-10-CM

## 2011-09-15 DIAGNOSIS — I2510 Atherosclerotic heart disease of native coronary artery without angina pectoris: Secondary | ICD-10-CM | POA: Diagnosis present

## 2011-09-15 DIAGNOSIS — I10 Essential (primary) hypertension: Secondary | ICD-10-CM | POA: Diagnosis present

## 2011-09-15 DIAGNOSIS — J9610 Chronic respiratory failure, unspecified whether with hypoxia or hypercapnia: Secondary | ICD-10-CM | POA: Diagnosis present

## 2011-09-15 DIAGNOSIS — Z880 Allergy status to penicillin: Secondary | ICD-10-CM | POA: Diagnosis not present

## 2011-09-15 DIAGNOSIS — C3490 Malignant neoplasm of unspecified part of unspecified bronchus or lung: Secondary | ICD-10-CM

## 2011-09-15 DIAGNOSIS — J329 Chronic sinusitis, unspecified: Secondary | ICD-10-CM

## 2011-09-15 DIAGNOSIS — J9691 Respiratory failure, unspecified with hypoxia: Secondary | ICD-10-CM

## 2011-09-15 DIAGNOSIS — E1149 Type 2 diabetes mellitus with other diabetic neurological complication: Secondary | ICD-10-CM | POA: Diagnosis present

## 2011-09-15 DIAGNOSIS — Z7982 Long term (current) use of aspirin: Secondary | ICD-10-CM | POA: Diagnosis not present

## 2011-09-15 DIAGNOSIS — E119 Type 2 diabetes mellitus without complications: Secondary | ICD-10-CM | POA: Diagnosis present

## 2011-09-15 DIAGNOSIS — D649 Anemia, unspecified: Secondary | ICD-10-CM | POA: Diagnosis not present

## 2011-09-15 DIAGNOSIS — Z87891 Personal history of nicotine dependence: Secondary | ICD-10-CM

## 2011-09-15 DIAGNOSIS — E118 Type 2 diabetes mellitus with unspecified complications: Secondary | ICD-10-CM

## 2011-09-15 DIAGNOSIS — C3410 Malignant neoplasm of upper lobe, unspecified bronchus or lung: Secondary | ICD-10-CM

## 2011-09-15 DIAGNOSIS — Z888 Allergy status to other drugs, medicaments and biological substances status: Secondary | ICD-10-CM | POA: Diagnosis not present

## 2011-09-15 DIAGNOSIS — T451X5A Adverse effect of antineoplastic and immunosuppressive drugs, initial encounter: Secondary | ICD-10-CM | POA: Diagnosis present

## 2011-09-15 DIAGNOSIS — E785 Hyperlipidemia, unspecified: Secondary | ICD-10-CM | POA: Diagnosis present

## 2011-09-15 DIAGNOSIS — N289 Disorder of kidney and ureter, unspecified: Secondary | ICD-10-CM | POA: Diagnosis present

## 2011-09-15 DIAGNOSIS — Z833 Family history of diabetes mellitus: Secondary | ICD-10-CM | POA: Diagnosis not present

## 2011-09-15 DIAGNOSIS — R531 Weakness: Secondary | ICD-10-CM

## 2011-09-15 DIAGNOSIS — E1165 Type 2 diabetes mellitus with hyperglycemia: Secondary | ICD-10-CM | POA: Diagnosis not present

## 2011-09-15 DIAGNOSIS — R112 Nausea with vomiting, unspecified: Secondary | ICD-10-CM | POA: Diagnosis not present

## 2011-09-15 DIAGNOSIS — Z79899 Other long term (current) drug therapy: Secondary | ICD-10-CM | POA: Diagnosis not present

## 2011-09-15 DIAGNOSIS — J479 Bronchiectasis, uncomplicated: Secondary | ICD-10-CM | POA: Diagnosis not present

## 2011-09-15 DIAGNOSIS — D696 Thrombocytopenia, unspecified: Secondary | ICD-10-CM

## 2011-09-15 DIAGNOSIS — E876 Hypokalemia: Secondary | ICD-10-CM

## 2011-09-15 DIAGNOSIS — K219 Gastro-esophageal reflux disease without esophagitis: Secondary | ICD-10-CM

## 2011-09-15 DIAGNOSIS — R42 Dizziness and giddiness: Secondary | ICD-10-CM | POA: Diagnosis not present

## 2011-09-15 DIAGNOSIS — E1142 Type 2 diabetes mellitus with diabetic polyneuropathy: Secondary | ICD-10-CM | POA: Diagnosis present

## 2011-09-15 DIAGNOSIS — R0600 Dyspnea, unspecified: Secondary | ICD-10-CM | POA: Diagnosis present

## 2011-09-15 DIAGNOSIS — R197 Diarrhea, unspecified: Secondary | ICD-10-CM

## 2011-09-15 DIAGNOSIS — J90 Pleural effusion, not elsewhere classified: Secondary | ICD-10-CM | POA: Diagnosis not present

## 2011-09-15 DIAGNOSIS — I739 Peripheral vascular disease, unspecified: Secondary | ICD-10-CM

## 2011-09-15 DIAGNOSIS — E860 Dehydration: Secondary | ICD-10-CM

## 2011-09-15 DIAGNOSIS — K2289 Other specified disease of esophagus: Secondary | ICD-10-CM | POA: Diagnosis not present

## 2011-09-15 DIAGNOSIS — R918 Other nonspecific abnormal finding of lung field: Secondary | ICD-10-CM | POA: Diagnosis not present

## 2011-09-15 DIAGNOSIS — J449 Chronic obstructive pulmonary disease, unspecified: Secondary | ICD-10-CM | POA: Diagnosis not present

## 2011-09-15 DIAGNOSIS — Z808 Family history of malignant neoplasm of other organs or systems: Secondary | ICD-10-CM | POA: Diagnosis not present

## 2011-09-15 DIAGNOSIS — IMO0002 Reserved for concepts with insufficient information to code with codable children: Secondary | ICD-10-CM | POA: Diagnosis not present

## 2011-09-15 HISTORY — DX: Age-related osteoporosis without current pathological fracture: M81.0

## 2011-09-15 HISTORY — DX: Other intervertebral disc degeneration, lumbar region: M51.36

## 2011-09-15 HISTORY — DX: Bronchopneumonia, unspecified organism: J18.0

## 2011-09-15 HISTORY — DX: Unspecified hearing loss, right ear: H91.91

## 2011-09-15 HISTORY — DX: Adverse effect of antineoplastic and immunosuppressive drugs, initial encounter: T45.1X5A

## 2011-09-15 HISTORY — DX: Personal history of other diseases of the respiratory system: Z87.09

## 2011-09-15 HISTORY — DX: Anxiety disorder, unspecified: F41.9

## 2011-09-15 HISTORY — DX: Personal history of other medical treatment: Z92.89

## 2011-09-15 HISTORY — DX: Peripheral vascular disease, unspecified: I73.9

## 2011-09-15 HISTORY — DX: Headache: R51

## 2011-09-15 HISTORY — DX: Syncope and collapse: R55

## 2011-09-15 HISTORY — DX: Headache, unspecified: R51.9

## 2011-09-15 HISTORY — DX: Renal tubulo-interstitial disease, unspecified: N15.9

## 2011-09-15 HISTORY — DX: Personal history of other venous thrombosis and embolism: Z86.718

## 2011-09-15 HISTORY — DX: Mental disorder, not otherwise specified: F99

## 2011-09-15 HISTORY — DX: Fibromyalgia: M79.7

## 2011-09-15 HISTORY — DX: Shortness of breath: R06.02

## 2011-09-15 HISTORY — DX: Scoliosis, unspecified: M41.9

## 2011-09-15 HISTORY — DX: Unspecified osteoarthritis, unspecified site: M19.90

## 2011-09-15 HISTORY — DX: Other intervertebral disc degeneration, lumbar region without mention of lumbar back pain or lower extremity pain: M51.369

## 2011-09-15 LAB — DIFFERENTIAL
Eosinophils Absolute: 0.1 10*3/uL (ref 0.0–0.7)
Eosinophils Relative: 2 % (ref 0–5)
Lymphs Abs: 0.9 10*3/uL (ref 0.7–4.0)
Monocytes Absolute: 0.8 10*3/uL (ref 0.1–1.0)
Monocytes Relative: 13 % — ABNORMAL HIGH (ref 3–12)

## 2011-09-15 LAB — BASIC METABOLIC PANEL
BUN: 14 mg/dL (ref 6–23)
CO2: 23 mEq/L (ref 19–32)
Calcium: 9.5 mg/dL (ref 8.4–10.5)
Creatinine, Ser: 1.16 mg/dL — ABNORMAL HIGH (ref 0.50–1.10)
GFR calc non Af Amer: 48 mL/min — ABNORMAL LOW (ref >90)
Glucose, Bld: 156 mg/dL — ABNORMAL HIGH (ref 70–99)
Sodium: 136 mEq/L (ref 135–145)

## 2011-09-15 LAB — CARDIAC PANEL(CRET KIN+CKTOT+MB+TROPI)
CK, MB: 1.2 ng/mL (ref 0.3–4.0)
Total CK: 32 U/L (ref 7–177)
Troponin I: <0.3 ng/mL (ref <0.30)

## 2011-09-15 LAB — CBC
HCT: 20.5 % — ABNORMAL LOW (ref 36.0–46.0)
MCH: 31.6 pg (ref 26.0–34.0)
MCV: 96.7 fL (ref 78.0–100.0)
Platelets: 228 10*3/uL (ref 150–400)
RBC: 2.12 MIL/uL — ABNORMAL LOW (ref 3.87–5.11)

## 2011-09-15 MED ORDER — SODIUM CHLORIDE 0.9 % IV SOLN
INTRAVENOUS | Status: DC
  Administered 2011-09-15: 20 mL/h via INTRAVENOUS

## 2011-09-15 MED ORDER — POLYETHYLENE GLYCOL 3350 17 G PO PACK
17.0000 g | PACK | Freq: Every day | ORAL | Status: DC | PRN
  Filled 2011-09-15: qty 1

## 2011-09-15 MED ORDER — ACETAMINOPHEN 650 MG RE SUPP: 650.0000 mg | Freq: Four times a day (QID) | RECTAL | Status: DC | PRN

## 2011-09-15 MED ORDER — CITALOPRAM HYDROBROMIDE 20 MG PO TABS
20.0000 mg | ORAL_TABLET | Freq: Every day | ORAL | Status: DC
  Administered 2011-09-16 – 2011-09-17 (×2): 20 mg via ORAL
  Filled 2011-09-15 (×2): qty 1

## 2011-09-15 MED ORDER — PREDNISONE 20 MG PO TABS
50.0000 mg | ORAL_TABLET | Freq: Once | ORAL | Status: AC
  Administered 2011-09-15: 50 mg via ORAL
  Filled 2011-09-15: qty 3

## 2011-09-15 MED ORDER — PROCHLORPERAZINE MALEATE 10 MG PO TABS
10.0000 mg | ORAL_TABLET | Freq: Four times a day (QID) | ORAL | Status: DC | PRN
  Filled 2011-09-15: qty 1

## 2011-09-15 MED ORDER — SUCRALFATE 1 G PO TABS
1.0000 g | ORAL_TABLET | Freq: Four times a day (QID) | ORAL | Status: DC
  Administered 2011-09-16 – 2011-09-17 (×5): 1 g via ORAL
  Filled 2011-09-15 (×9): qty 1

## 2011-09-15 MED ORDER — DIPHENHYDRAMINE HCL 50 MG PO CAPS
50.0000 mg | ORAL_CAPSULE | Freq: Once | ORAL | Status: AC
  Administered 2011-09-16: 50 mg via ORAL
  Filled 2011-09-15: qty 1

## 2011-09-15 MED ORDER — INSULIN ASPART 100 UNIT/ML ~~LOC~~ SOLN
0.0000 [IU] | Freq: Three times a day (TID) | SUBCUTANEOUS | Status: DC
  Administered 2011-09-16: 3 [IU] via SUBCUTANEOUS
  Administered 2011-09-16: 2 [IU] via SUBCUTANEOUS
  Administered 2011-09-17: 1 [IU] via SUBCUTANEOUS

## 2011-09-15 MED ORDER — PANTOPRAZOLE SODIUM 40 MG PO TBEC
40.0000 mg | DELAYED_RELEASE_TABLET | Freq: Every day | ORAL | Status: DC
  Administered 2011-09-16 – 2011-09-17 (×2): 40 mg via ORAL
  Filled 2011-09-15 (×2): qty 1

## 2011-09-15 MED ORDER — TRAMADOL HCL 50 MG PO TABS
50.0000 mg | ORAL_TABLET | Freq: Four times a day (QID) | ORAL | Status: DC | PRN
  Administered 2011-09-16 – 2011-09-17 (×3): 50 mg via ORAL
  Filled 2011-09-15 (×3): qty 1

## 2011-09-15 MED ORDER — ALPRAZOLAM 0.5 MG PO TABS
1.0000 mg | ORAL_TABLET | Freq: Three times a day (TID) | ORAL | Status: DC | PRN
  Administered 2011-09-15 – 2011-09-16 (×2): 1 mg via ORAL
  Filled 2011-09-15 (×4): qty 1

## 2011-09-15 MED ORDER — ALBUTEROL SULFATE (5 MG/ML) 0.5% IN NEBU
2.5000 mg | INHALATION_SOLUTION | RESPIRATORY_TRACT | Status: DC | PRN
  Administered 2011-09-17: 2.5 mg via RESPIRATORY_TRACT
  Filled 2011-09-15: qty 0.5

## 2011-09-15 MED ORDER — SODIUM CHLORIDE 0.9 % IJ SOLN
3.0000 mL | Freq: Two times a day (BID) | INTRAMUSCULAR | Status: DC
  Administered 2011-09-15 – 2011-09-17 (×4): 3 mL via INTRAVENOUS

## 2011-09-15 MED ORDER — DILTIAZEM HCL ER COATED BEADS 240 MG PO CP24
240.0000 mg | ORAL_CAPSULE | Freq: Every day | ORAL | Status: DC
  Administered 2011-09-16 – 2011-09-17 (×2): 240 mg via ORAL
  Filled 2011-09-15 (×2): qty 1

## 2011-09-15 MED ORDER — HYDROCODONE-ACETAMINOPHEN 5-325 MG PO TABS: 1.5000 | ORAL_TABLET | Freq: Four times a day (QID) | ORAL | Status: DC | PRN

## 2011-09-15 MED ORDER — FUROSEMIDE 10 MG/ML IJ SOLN
20.0000 mg | Freq: Once | INTRAMUSCULAR | Status: AC
  Administered 2011-09-16: 20 mg via INTRAVENOUS
  Filled 2011-09-15: qty 2

## 2011-09-15 MED ORDER — PREDNISONE 50 MG PO TABS
50.0000 mg | ORAL_TABLET | Freq: Once | ORAL | Status: AC
  Administered 2011-09-16: 50 mg via ORAL
  Filled 2011-09-15: qty 1

## 2011-09-15 MED ORDER — LORATADINE 10 MG PO TABS
10.0000 mg | ORAL_TABLET | Freq: Every day | ORAL | Status: DC
  Administered 2011-09-15 – 2011-09-17 (×3): 10 mg via ORAL
  Filled 2011-09-15 (×3): qty 1

## 2011-09-15 MED ORDER — IPRATROPIUM-ALBUTEROL 18-103 MCG/ACT IN AERO
1.0000 | INHALATION_SPRAY | Freq: Four times a day (QID) | RESPIRATORY_TRACT | Status: DC | PRN
Start: 2011-09-15 — End: 2011-09-17

## 2011-09-15 MED ORDER — MORPHINE SULFATE 2 MG/ML IJ SOLN: 2.0000 mg | INTRAMUSCULAR | Status: DC | PRN

## 2011-09-15 MED ORDER — GEMFIBROZIL 600 MG PO TABS
600.0000 mg | ORAL_TABLET | Freq: Every day | ORAL | Status: DC
  Administered 2011-09-15 – 2011-09-17 (×3): 600 mg via ORAL
  Filled 2011-09-15 (×3): qty 1

## 2011-09-15 MED ORDER — ONDANSETRON HCL 4 MG/2ML IJ SOLN: 4.0000 mg | Freq: Four times a day (QID) | INTRAMUSCULAR | Status: DC | PRN

## 2011-09-15 MED ORDER — HYDROCODONE-ACETAMINOPHEN 5-500 MG PO TABS: 1.5000 | ORAL_TABLET | Freq: Four times a day (QID) | ORAL | Status: DC | PRN

## 2011-09-15 MED ORDER — ACETAMINOPHEN 325 MG PO TABS: 650.0000 mg | ORAL_TABLET | Freq: Four times a day (QID) | ORAL | Status: DC | PRN

## 2011-09-15 MED ORDER — ONDANSETRON HCL 4 MG PO TABS: 4.0000 mg | ORAL_TABLET | Freq: Four times a day (QID) | ORAL | Status: DC | PRN

## 2011-09-15 NOTE — ED Notes (Signed)
Report given to Merci RN on 2000

## 2011-09-15 NOTE — ED Provider Notes (Signed)
History     CSN: [id number]  Arrival date & time 09/15/11  1531   First MD Initiated Contact with Patient 09/15/11 1612      Chief Complaint  Patient presents with  . Shortness of Breath    (Consider location/radiation/quality/duration/timing/severity/associated sxs/prior treatment) Patient is a 66 y.o. female presenting with shortness of breath. The history is provided by the patient.  Shortness of Breath  The current episode started more than 2 weeks ago. The onset was gradual. The problem occurs continuously. The problem has been unchanged. The symptoms are relieved by rest. The symptoms are aggravated by activity. Associated symptoms include chest pain, rhinorrhea, cough and shortness of breath. Pertinent negatives include no chest pressure, no orthopnea, no fever and no wheezing.  Pt states she has been feeling dizzy, weak, short of breath for the last month. Yesterday was seen by her PCP, had blood work done which showed hgb of 7. Pt with diagnosis of lung ca, currenltly on chemo and radiation. Last chemo about 2 mon ago. States has had this problem with hgb in the past, and has had to have RPBC and platelets transfusion. Denies seeing blood in stool, urine. No other complaints.   Past Medical History  Diagnosis Date  . Polyneuropathy in diabetes   . Restless legs syndrome (RLS)   . Chronic sinusitis   . Hypertension   . Hyperlipidemia   . Type II or unspecified type diabetes mellitus without mention of complication, not stated as uncontrolled   . Anemia, unspecified   . GERD (gastroesophageal reflux disease)   . Respiratory failure with hypoxia 01/02/2010  . Complication of anesthesia     slow to wake up  . Adenocarcinoma, lung     upper left lobe  . COPD (chronic obstructive pulmonary disease)   . Emphysema   . Asthma   . Coronary artery disease     Dr. Excell Seltzer had stress test done  . Dysrhythmia     unsure what type  . Peripheral vascular disease   . Non-small cell  lung cancer 03/31/2011    Adenocarcinoma Rt upper lobe mass    Past Surgical History  Procedure Date  . Lung lobectomy     left  . Septoplasty   . Total hip arthroplasty 01/2010    left  . Bronchoscopy 02/2011  . Tubal ligation     Family History  Problem Relation Age of Onset  . Bone cancer Father   . Diabetes Mother     History  Substance Use Topics  . Smoking status: Former Smoker -- 1.5 packs/day for 40 years    Types: Cigarettes    Quit date: 02/10/1997  . Smokeless tobacco: Never Used  . Alcohol Use: No     quit 1999    OB History    Grav Para Term Preterm Abortions TAB SAB Ect Mult Living                  Review of Systems  Constitutional: Positive for fatigue. Negative for fever and chills.  HENT: Positive for rhinorrhea.   Eyes: Negative for visual disturbance.  Respiratory: Positive for cough, chest tightness and shortness of breath. Negative for wheezing.   Cardiovascular: Positive for chest pain. Negative for palpitations, orthopnea and leg swelling.  Gastrointestinal: Negative for nausea, vomiting and abdominal pain.  Genitourinary: Negative for dysuria.  Musculoskeletal: Negative for myalgias.  Skin: Negative.   Neurological: Positive for dizziness, weakness and light-headedness.    Allergies  Iohexol; Codeine;  and Penicillins  Home Medications   Current Outpatient Rx  Name Route Sig Dispense Refill  . ALBUTEROL SULFATE (2.5 MG/3ML) 0.083% IN NEBU Nebulization Take 3 mLs (2.5 mg total) by nebulization every 6 (six) hours as needed for wheezing or shortness of breath.    Maximino Green[place] 18-103 MCG/ACT IN AERO Inhalation Inhale 1 puff into the lungs every 6 (six) hours as needed. For shortness of breath 2 Inhaler 2  . ALPRAZOLAM 1 MG PO TABS Oral Take 1 mg by mouth 3 (three) times daily as needed. For anxiety    . MAGIC MOUTHWASH W/LIDOCAINE Oral Take 5 mLs by mouth 4 (four) times daily as needed. 200 mL 0  . ASPIRIN EC 325 MG PO TBEC   Hold for the next 4 days then resume taking one tablet by mouth daily 30 tablet 1  . CITALOPRAM HYDROBROMIDE 40 MG PO TABS Oral Take 0.5 tablets (20 mg total) by mouth daily.    Marland Kitchen DEXAMETHASONE 4 MG PO TABS Oral Take 4 mg by mouth 2 (two) times daily with a meal. Take 1 tablet twice a day the day before, the day of and the day after chemotherapy.     Marland Kitchen DILTIAZEM HCL ER COATED BEADS 240 MG PO CP24 Oral Take 1 capsule (240 mg total) by mouth daily. 30 capsule 6  . GEMFIBROZIL 600 MG PO TABS Oral Take 600 mg by mouth daily.     Marland Kitchen HYDROCODONE-ACETAMINOPHEN 7.5-750 MG PO TABS Oral Take 1 tablet by mouth every 6 (six) hours as needed. For severe pain    . LORATADINE 10 MG PO TABS Oral Take 1 tablet (10 mg total) by mouth daily. 30 tablet 3  . LOSARTAN POTASSIUM 50 MG PO TABS Oral Take 1 tablet (50 mg total) by mouth 2 (two) times daily. 60 tablet 6  . METFORMIN HCL 500 MG PO TABS Oral Take 500 mg by mouth 2 (two) times daily with a meal.     . PANTOPRAZOLE SODIUM 40 MG PO TBEC Oral Take 1 tablet (40 mg total) by mouth daily. 30 tablet 1  . POLYETHYLENE GLYCOL 3350 PO PACK Oral Take 17 g by mouth daily as needed. For constipation    . POTASSIUM CHLORIDE CRYS ER 20 MEQ PO TBCR Oral Take 1 tablet (20 mEq total) by mouth 2 (two) times daily. 60 tablet 6  . PROCHLORPERAZINE MALEATE 10 MG PO TABS Oral Take 10 mg by mouth every 6 (six) hours as needed. For nausea    . SENNA 8.6 MG PO TABS Oral Take 1 tablet by mouth daily as needed. For constipation    . SUCRALFATE 1 G PO TABS Oral Take 1 tablet (1 g total) by mouth 4 (four) times daily. Dissolve in 15 ml water. 90 tablet 1  . TRAMADOL HCL 50 MG PO TABS Oral Take 50 mg by mouth every 4 (four) hours as needed. For moderate pain    . TRIAMCINOLONE ACETONIDE 0.1 % EX OINT Topical Apply topically 2 (two) times daily.    . TRIAMTERENE-HCTZ 37.5-25 MG PO TABS Oral Take 1 each (1 tablet total) by mouth daily. 30 tablet 6    BP 98/40  Pulse 99  Temp(Src) 97 F  (36.1 C) (Oral)  Resp 18  SpO2 94%  Physical Exam  Nursing note and vitals reviewed. Constitutional: She appears well-developed and well-nourished. No distress.  HENT:  Head: Normocephalic.  Eyes: Conjunctivae are normal.  Neck: Normal range of motion. Neck supple.  Cardiovascular: Normal rate,  regular rhythm and normal heart sounds.   Pulmonary/Chest: Effort normal and breath sounds normal. No respiratory distress. She has no wheezes. She has no rales.  Abdominal: Soft. Bowel sounds are normal. She exhibits no distension. There is no tenderness.  Musculoskeletal: Normal range of motion. She exhibits no edema.  Neurological: She is alert.  Skin: Skin is warm and dry.  Psychiatric: She has a normal mood and affect.    ED Course  Procedures (including critical care time)  Pt with SOB, dizziness, weakness for about a month now. Pt was found to have hgb of 7 in the office yesterday. Will recheck today. No complaints of bleeding. Pt in no distress, VS normal.   Results for orders placed during the hospital encounter of 09/15/11  CBC      Component Value Range   WBC 6.0  4.0 - 10.5 (K/uL)   RBC 2.12 (*) 3.87 - 5.11 (MIL/uL)   Hemoglobin 6.7 (*) 12.0 - 15.0 (g/dL)   HCT 16.1 (*) 09.6 - 46.0 (%)   MCV 96.7  78.0 - 100.0 (fL)   MCH 31.6  26.0 - 34.0 (pg)   MCHC 32.7  30.0 - 36.0 (g/dL)   RDW 04.5 (*) 40.9 - 15.5 (%)   Platelets 228  150 - 400 (K/uL)  DIFFERENTIAL      Component Value Range   Neutrophils Relative 69  43 - 77 (%)   Neutro Abs 4.1  1.7 - 7.7 (K/uL)   Lymphocytes Relative 16  12 - 46 (%)   Lymphs Abs 0.9  0.7 - 4.0 (K/uL)   Monocytes Relative 13 (*) 3 - 12 (%)   Monocytes Absolute 0.8  0.1 - 1.0 (K/uL)   Eosinophils Relative 2  0 - 5 (%)   Eosinophils Absolute 0.1  0.0 - 0.7 (K/uL)   Basophils Relative 0  0 - 1 (%)   Basophils Absolute 0.0  0.0 - 0.1 (K/uL)  BASIC METABOLIC PANEL      Component Value Range   Sodium 136  135 - 145 (mEq/L)   Potassium 4.1  3.5 -  5.1 (mEq/L)   Chloride 100  96 - 112 (mEq/L)   CO2 23  19 - 32 (mEq/L)   Glucose, Bld 156 (*) 70 - 99 (mg/dL)   BUN 14  6 - 23 (mg/dL)   Creatinine, Ser 8.11 (*) 0.50 - 1.10 (mg/dL)   Calcium 9.5  8.4 - 91.4 (mg/dL)   GFR calc non Af Amer 48 (*) >90 (mL/min)   GFR calc Af Amer 55 (*) >90 (mL/min)  SAMPLE TO BLOOD BANK      Component Value Range   Blood Bank Specimen SAMPLE AVAILABLE FOR TESTING     Sample Expiration 09/16/2011    TROPONIN I      Component Value Range   Troponin I <0.30  <0.30 (ng/mL)  TYPE AND SCREEN      Component Value Range   ABO/RH(D) O NEG     Antibody Screen PENDING     Sample Expiration 09/18/2011    PREPARE RBC (CROSSMATCH)      Component Value Range   Order Confirmation ORDER PROCESSED BY BLOOD BANK     Dg Chest 2 View  09/15/2011  *RADIOLOGY REPORT*  Clinical Data: Shortness of breath and lightheadedness  CHEST - 2 VIEW  Comparison: 08/08/2011  Findings: Heart size is normal.  No pleural effusion or interstitial edema identified.  Bilateral upper lobe airspace opacities appear more confluent when compared with the  previous examination.  In the setting of external beam radiation these findings may represent areas of progressive radiation change with consolidation and fibrosis.  Superimposed infection cannot be excluded.  The lower lobes are clear.  The visualized osseous structures are unremarkable.  IMPRESSION:  1.  Progressive bilateral upper lobe confluents and consolidation. In the setting of external beam radiation this is nonspecific and may reflect radiation change.  Superimposed infection not excluded.  Original Report Authenticated By: Rosealee Albee, M.D.   6:05 PM Hgb here 6.7, will ordered transfusion. Spoke with [facility] hospitalist, will admit.     1. Anemia   2. Shortness of breath       MDM          Lottie Mussel, PA 09/16/11 [phone]

## 2011-09-15 NOTE — ED Notes (Signed)
Dr. Vassie Loll gave orders for prep for CTA in the morning since patient is allergic to the IV contrast.

## 2011-09-15 NOTE — ED Notes (Signed)
Arnetha Massy PAC was made aware that the patient has antibodies and that her blood for transfusion will not be available soon.

## 2011-09-15 NOTE — ED Notes (Signed)
Pt. Was called by Dr. Allen Kell sent to [place] for a blood transfusion, hbg. Is 7.  Pt. Is sob and dizzy, denies any bleeding

## 2011-09-15 NOTE — ED Notes (Signed)
Pt stated that the patient has been SOB x 1 month that got progressively worse especially on exertion. Pt looks pale but skin is warm and dry. Pt is attached to the cardiac monitor. O2 started at 2 LPM/Vincent.  Family is at the bedside

## 2011-09-15 NOTE — H&P (Addendum)
PCP:   [name]., MD, MD   Chief Complaint:  SOB, generalized weakness and fatigue.  HPI: 66 year old female with a past medical history significant for diabetes, hypertension, hyperlipidemia, chronic respiratory failure secondary to COPD and non-small cell lung cancer; come into the hospital complaining of about 2 weeks history of shortness of breath, generalized weakness and fatigue. Patient reports that the shortness of breath has continuously worsening throughout this time and at this moment is making impossible for her to do any kind of activity. Patient states that she has been feeling also dizzy and lightheaded. She reports that about 2 months ago she requires admission into the hospital secondary to severe symptomatic anemia and also thrombocytopenia (abdominal mentioned diagnoses was attribute it to the use of chemotherapy agents); patient reports that she had been out of chemotherapy treatment for about a month or so and denies any hematemesis, melena, or any other acute signs of bleeding. Patient also denies any fever, any cough, any chest pressure, denies chest pain, orthopnea, wheezing or any other acute complaints. In the ED a chest x-ray demonstrated chronic changes as a sitter with radiation treatment but unable to rule out any superimposed infection. Patient was no febrile and her blood work demonstrated a hemoglobin of 6.7 reasonable [facility] hospitalist was consulted for management of her symptomatic anemia.   Allergies:   Allergies  Allergen Reactions  . Iohexol Shortness Of Breath and Other (See Comments)    Burning of skin  . Codeine     REACTION: GI upset  . Penicillins     REACTION: itching      Past Medical History  Diagnosis Date  . Polyneuropathy in diabetes   . Restless legs syndrome (RLS)   . Chronic sinusitis   . Hypertension   . Hyperlipidemia   . Type II or unspecified type diabetes mellitus without mention of complication, not stated as uncontrolled    . Anemia, unspecified   . GERD (gastroesophageal reflux disease)   . Respiratory failure with hypoxia 01/02/2010  . Complication of anesthesia     slow to wake up  . Adenocarcinoma, lung     upper left lobe  . COPD (chronic obstructive pulmonary disease)   . Emphysema   . Asthma   . Coronary artery disease     Dr. Excell Seltzer had stress test done  . Dysrhythmia     unsure what type  . Peripheral vascular disease   . Non-small cell lung cancer 03/31/2011    Adenocarcinoma Rt upper lobe mass    Past Surgical History  Procedure Date  . Lung lobectomy     left  . Septoplasty   . Total hip arthroplasty 01/2010    left  . Bronchoscopy 02/2011  . Tubal ligation     Prior to Admission medications   Medication Sig Start Date End Date Taking? Authorizing Provider  albuterol (PROVENTIL) (2.5 MG/3ML) 0.083% nebulizer solution Take 3 mLs (2.5 mg total) by nebulization every 6 (six) hours as needed for wheezing or shortness of breath. 08/16/11 08/15/12 Yes Elease Etienne, MD  albuterol-ipratropium (COMBIVENT) 18-103 MCG/ACT inhaler Inhale 1 puff into the lungs every 6 (six) hours as needed. For shortness of breath 08/25/11  Yes Si Gaul, MD  ALPRAZolam Prudy Feeler) 1 MG tablet Take 1 mg by mouth 3 (three) times daily as needed. For anxiety   Yes Historical Provider, MD  Alum & Mag Hydroxide-Simeth (MAGIC MOUTHWASH W/LIDOCAINE) SOLN Take 5 mLs by mouth 4 (four) times daily as needed. 07/28/11  Yes Si Gaul, MD  aspirin EC 325 MG tablet Hold for the next 4 days then resume taking one tablet by mouth daily 08/11/11  Yes Conni Slipper, PA  citalopram (CELEXA) 40 MG tablet Take 0.5 tablets (20 mg total) by mouth daily. 08/16/11  Yes Elease Etienne, MD  dexamethasone (DECADRON) 4 MG tablet Take 4 mg by mouth 2 (two) times daily with a meal. Take 1 tablet twice a day the day before, the day of and the day after chemotherapy.    Yes Historical Provider, MD  diltiazem (CARDIZEM CD) 240 MG 24 hr  capsule Take 1 capsule (240 mg total) by mouth daily. 07/06/11  Yes Tonny Bollman, MD  gemfibrozil (LOPID) 600 MG tablet Take 600 mg by mouth daily.    Yes Historical Provider, MD  HYDROcodone-acetaminophen (VICODIN ES) 7.5-750 MG per tablet Take 1 tablet by mouth every 6 (six) hours as needed. For severe pain   Yes Historical Provider, MD  loratadine (CLARITIN) 10 MG tablet Take 1 tablet (10 mg total) by mouth daily. 07/14/11 07/13/12 Yes [name], MD  losartan (COZAAR) 50 MG tablet Take 1 tablet (50 mg total) by mouth 2 (two) times daily. 07/28/11  Yes Tonny Bollman, MD  metFORMIN (GLUCOPHAGE) 500 MG tablet Take 500 mg by mouth 2 (two) times daily with a meal.    Yes Historical Provider, MD  pantoprazole (PROTONIX) 40 MG tablet Take 1 tablet (40 mg total) by mouth daily. 07/05/11 07/04/12 Yes Si Gaul, MD  polyethylene glycol [facility] / GLYCOLAX) packet Take 17 g by mouth daily as needed. For constipation   Yes Historical Provider, MD  potassium chloride SA (K-DUR,KLOR-CON) 20 MEQ tablet Take 1 tablet (20 mEq total) by mouth 2 (two) times daily. 03/03/11  Yes Tonny Bollman, MD  prochlorperazine (COMPAZINE) 10 MG tablet Take 10 mg by mouth every 6 (six) hours as needed. For nausea   Yes Historical Provider, MD  senna (SENOKOT) 8.6 MG TABS Take 1 tablet by mouth daily as needed. For constipation   Yes Historical Provider, MD  sucralfate (CARAFATE) 1 G tablet Take 1 tablet (1 g total) by mouth 4 (four) times daily. Dissolve in 15 ml water. 06/03/11 06/02/12 Yes Jonna Coup, MD  traMADol (ULTRAM) 50 MG tablet Take 50 mg by mouth every 4 (four) hours as needed. For moderate pain   Yes Historical Provider, MD  triamcinolone ointment (KENALOG) 0.1 % Apply topically 2 (two) times daily.   Yes Historical Provider, MD  triamterene-hydrochlorothiazide (MAXZIDE-25) 37.5-25 MG per tablet Take 1 each (1 tablet total) by mouth daily. 08/30/11  Yes Tonny Bollman, MD    Social History:  reports that  she quit smoking about 14 years ago. Her smoking use included Cigarettes. She has a 60 pack-year smoking history. She has never used smokeless tobacco. She reports that she uses illicit drugs (Hydrocodone). She reports that she does not drink alcohol.  Family History  Problem Relation Age of Onset  . Bone cancer Father   . Diabetes Mother     Review of Systems:  Generalized weakness; lightheadedness, tachycardia; SOB and fatigue. Otherwise negative except as mentioned on HPI.   Physical Exam: Blood pressure 126/39, pulse 84, temperature 97 F (36.1 C), temperature source Oral, resp. rate 22, SpO2 98.00%. Constitutional: She appears well-developed and well-nourished. No distress.  HENT:  Head: Normocephalic.  Eyes: Conjunctivae are normal.  Neck: Normal range of motion. Neck supple.  Cardiovascular: Normal rate, regular rhythm and normal heart sounds.  Pulmonary/Chest: Effort normal and breath sounds normal. No respiratory distress. She has no wheezes. She has no rales.  Abdominal: Soft. Bowel sounds are normal. She exhibits no distension. There is no tenderness.  Musculoskeletal: Normal range of motion. She exhibits no edema.  Neurological: She is alert.  Skin: Skin is warm and dry.  Psychiatric: She has a normal mood and affect.    Labs on Admission:  Results for orders placed during the hospital encounter of 09/15/11 (from the past 48 hour(s))  CBC     Status: Abnormal   Collection Time   09/15/11  4:16 PM      Component Value Range Comment   WBC 6.0  4.0 - 10.5 (K/uL)    RBC 2.12 (*) 3.87 - 5.11 (MIL/uL)    Hemoglobin 6.7 (*) 12.0 - 15.0 (g/dL)    HCT 16.1 (*) 09.6 - 46.0 (%)    MCV 96.7  78.0 - 100.0 (fL)    MCH 31.6  26.0 - 34.0 (pg)    MCHC 32.7  30.0 - 36.0 (g/dL)    RDW 04.5 (*) 40.9 - 15.5 (%)    Platelets 228  150 - 400 (K/uL)   DIFFERENTIAL     Status: Abnormal   Collection Time   09/15/11  4:16 PM      Component Value Range Comment   Neutrophils Relative 69  43  - 77 (%)    Neutro Abs 4.1  1.7 - 7.7 (K/uL)    Lymphocytes Relative 16  12 - 46 (%)    Lymphs Abs 0.9  0.7 - 4.0 (K/uL)    Monocytes Relative 13 (*) 3 - 12 (%)    Monocytes Absolute 0.8  0.1 - 1.0 (K/uL)    Eosinophils Relative 2  0 - 5 (%)    Eosinophils Absolute 0.1  0.0 - 0.7 (K/uL)    Basophils Relative 0  0 - 1 (%)    Basophils Absolute 0.0  0.0 - 0.1 (K/uL)   BASIC METABOLIC PANEL     Status: Abnormal   Collection Time   09/15/11  4:16 PM      Component Value Range Comment   Sodium 136  135 - 145 (mEq/L)    Potassium 4.1  3.5 - 5.1 (mEq/L)    Chloride 100  96 - 112 (mEq/L)    CO2 23  19 - 32 (mEq/L)    Glucose, Bld 156 (*) 70 - 99 (mg/dL)    BUN 14  6 - 23 (mg/dL)    Creatinine, Ser 8.11 (*) 0.50 - 1.10 (mg/dL)    Calcium 9.5  8.4 - 10.5 (mg/dL)    GFR calc non Af Amer 48 (*) >90 (mL/min)    GFR calc Af Amer 55 (*) >90 (mL/min)   TROPONIN I     Status: Normal   Collection Time   09/15/11  4:17 PM      Component Value Range Comment   Troponin I <0.30  <0.30 (ng/mL)   SAMPLE TO BLOOD BANK     Status: Normal   Collection Time   09/15/11  4:30 PM      Component Value Range Comment   Blood Bank Specimen SAMPLE AVAILABLE FOR TESTING      Sample Expiration 09/16/2011     TYPE AND SCREEN     Status: Normal (Preliminary result)   Collection Time   09/15/11  4:30 PM      Component Value Range Comment   ABO/RH(D) O NEG  Antibody Screen PENDING      Sample Expiration 09/18/2011     PREPARE RBC (CROSSMATCH)     Status: Normal   Collection Time   09/15/11  4:30 PM      Component Value Range Comment   Order Confirmation ORDER PROCESSED BY BLOOD BANK       Radiological Exams on Admission: Dg Chest 2 View  09/15/2011  *RADIOLOGY REPORT*  Clinical Data: Shortness of breath and lightheadedness  CHEST - 2 VIEW  Comparison: 08/08/2011  Findings: Heart size is normal.  No pleural effusion or interstitial edema identified.  Bilateral upper lobe airspace opacities appear more confluent  when compared with the previous examination.  In the setting of external beam radiation these findings may represent areas of progressive radiation change with consolidation and fibrosis.  Superimposed infection cannot be excluded.  The lower lobes are clear.  The visualized osseous structures are unremarkable.  IMPRESSION:  1.  Progressive bilateral upper lobe confluents and consolidation. In the setting of external beam radiation this is nonspecific and may reflect radiation change.  Superimposed infection not excluded.  Original Report Authenticated By: Rosealee Albee, M.D.     Assessment/Plan 1-SOB (shortness of breath): most likely associated with acute symptomatic anemia. Patient with hx of chronic resp failure oxygen dependent due to COPD; but at this point not wheezing. I doubt exacerbation. Also with risk factors for CAD (HTN, DM and HLD); will admit to telemetry, cycle cardiac enzymes and follow symptoms; so far no changes on EKG suggesting ischemia. Other possibility to her SOB especially with underlying diagnosis of lung cancer and increase sedentarism lately is PE; will get CTA of her chest to r/o.  2-Anemia: most likely associated with chemotherapy and bone marrow suppression; will transfuse 2 units; continue PPI; check occult blod test and follow Hgb.   3-Chronic resp failure: continue inhaler and nebs treatment as indicated at home; use IS and continue oxygen supplementation.   4-T1N0 AdenoCA of RUL: oncology consulted by ED; will follow any recommendations. Patient receiving radiation and chemotherapy treatment. (last one on hold for the last month or so according to patient).  5-Weakness generalized: secondary to anemia.  6-HYPERLIPIDEMIA: will check lipid profile and continue gemfibrozil.  7-DM:will use SSI; hold metformin for now.  8-ARI: most likely due to decrease perfusion due to anemia. Will hold ARB; gentle resuscitation and follow BMET in am.  DVT: SCD's  Time  Spent on Admission: 50 minutes  [name] [facility] [phone]  09/15/2011, 6:49 PM

## 2011-09-15 NOTE — ED Notes (Signed)
Attempted to start an IV site but unsuccessful. Will try when patient gets back from Radiology.

## 2011-09-15 NOTE — ED Notes (Signed)
Radiolgy notified that pt received first dose of prednisone

## 2011-09-16 ENCOUNTER — Ambulatory Visit: Payer: Medicare Other | Admitting: Neurosurgery

## 2011-09-16 ENCOUNTER — Inpatient Hospital Stay (HOSPITAL_COMMUNITY): Payer: Medicare Other

## 2011-09-16 ENCOUNTER — Encounter (HOSPITAL_COMMUNITY): Payer: Self-pay | Admitting: General Practice

## 2011-09-16 ENCOUNTER — Ambulatory Visit: Payer: Medicare Other | Admitting: Radiation Oncology

## 2011-09-16 DIAGNOSIS — R112 Nausea with vomiting, unspecified: Secondary | ICD-10-CM

## 2011-09-16 DIAGNOSIS — E1165 Type 2 diabetes mellitus with hyperglycemia: Secondary | ICD-10-CM

## 2011-09-16 DIAGNOSIS — J90 Pleural effusion, not elsewhere classified: Secondary | ICD-10-CM | POA: Diagnosis not present

## 2011-09-16 DIAGNOSIS — J479 Bronchiectasis, uncomplicated: Secondary | ICD-10-CM | POA: Diagnosis not present

## 2011-09-16 DIAGNOSIS — K228 Other specified diseases of esophagus: Secondary | ICD-10-CM | POA: Diagnosis not present

## 2011-09-16 DIAGNOSIS — E118 Type 2 diabetes mellitus with unspecified complications: Secondary | ICD-10-CM | POA: Diagnosis not present

## 2011-09-16 DIAGNOSIS — R0602 Shortness of breath: Secondary | ICD-10-CM

## 2011-09-16 DIAGNOSIS — C3410 Malignant neoplasm of upper lobe, unspecified bronchus or lung: Secondary | ICD-10-CM | POA: Diagnosis not present

## 2011-09-16 HISTORY — PX: FETAL BLOOD TRANSFUSION: SHX1602

## 2011-09-16 LAB — LIPID PANEL
HDL: 68 mg/dL (ref >39)
LDL Cholesterol: 90 mg/dL (ref 0–99)
Total CHOL/HDL Ratio: 2.6 RATIO
Triglycerides: 92 mg/dL (ref <150)

## 2011-09-16 LAB — HEMOGLOBIN A1C
Hgb A1c MFr Bld: 5.1 % (ref <5.7)
Mean Plasma Glucose: 100 mg/dL (ref <117)

## 2011-09-16 LAB — CBC
HCT: 28.9 % — ABNORMAL LOW (ref 36.0–46.0)
Hemoglobin: 10.2 g/dL — ABNORMAL LOW (ref 12.0–15.0)
MCHC: 35.3 g/dL (ref 30.0–36.0)
WBC: 4.9 10*3/uL (ref 4.0–10.5)

## 2011-09-16 LAB — BASIC METABOLIC PANEL
Chloride: 94 mEq/L — ABNORMAL LOW (ref 96–112)
GFR calc Af Amer: 65 mL/min — ABNORMAL LOW (ref >90)
GFR calc non Af Amer: 56 mL/min — ABNORMAL LOW (ref >90)
Glucose, Bld: 151 mg/dL — ABNORMAL HIGH (ref 70–99)
Potassium: 3.4 mEq/L — ABNORMAL LOW (ref 3.5–5.1)
Sodium: 134 mEq/L — ABNORMAL LOW (ref 135–145)

## 2011-09-16 LAB — CARDIAC PANEL(CRET KIN+CKTOT+MB+TROPI)
CK, MB: 1.2 ng/mL (ref 0.3–4.0)
Relative Index: INVALID (ref 0.0–2.5)

## 2011-09-16 LAB — GLUCOSE, CAPILLARY
Glucose-Capillary: 125 mg/dL — ABNORMAL HIGH (ref 70–99)
Glucose-Capillary: 186 mg/dL — ABNORMAL HIGH (ref 70–99)

## 2011-09-16 MED ORDER — BIOTENE DRY MOUTH MT LIQD
15.0000 mL | Freq: Two times a day (BID) | OROMUCOSAL | Status: DC
  Administered 2011-09-16 – 2011-09-17 (×3): 15 mL via OROMUCOSAL

## 2011-09-16 MED ORDER — IOHEXOL 350 MG/ML SOLN
100.0000 mL | Freq: Once | INTRAVENOUS | Status: AC | PRN
  Administered 2011-09-16: 100 mL via INTRAVENOUS

## 2011-09-16 MED ORDER — POTASSIUM CHLORIDE CRYS ER 20 MEQ PO TBCR
40.0000 meq | EXTENDED_RELEASE_TABLET | ORAL | Status: AC
  Administered 2011-09-16 (×2): 40 meq via ORAL
  Filled 2011-09-16 (×2): qty 2

## 2011-09-16 NOTE — Progress Notes (Signed)
Subjective: Feeling better; no CP, improved SOB and generalized weakness after transfussion; CTA negative for PE. Patient afebrile.  Objective: Vital signs in last 24 hours: Temp:  [97 F (36.1 C)-98.4 F (36.9 C)] 97.7 F (36.5 C) (06/06 0600) Pulse Rate:  [75-99] 80  (06/06 0600) Resp:  [16-22] 20  (06/06 0600) BP: (98-153)/(39-78) 117/71 mmHg (06/06 0600) SpO2:  [94 %-100 %] 99 % (06/06 0500) Weight:  [74.3 kg (163 lb 12.8 oz)] 74.3 kg (163 lb 12.8 oz) (06/05 2019) Weight change:     Intake/Output from previous day: 06/05 0701 - 06/06 0700 In: 1052 [P.O.:240; I.V.:100; Blood:712] Out: 1250 [Urine:1250] Total I/O In: 240 [P.O.:240] Out: -    Physical Exam: General: Alert, awake, oriented x3, in no acute distress. HEENT: No bruits, no goiter. Heart: Regular rate and rhythm, positive systolic murmur, No rubs or gallops. Lungs:No wheezing or rales. Positive scattered rhonchi Abdomen: Soft, nontender, nondistended, positive bowel sounds. Extremities: No clubbing cyanosis or edema with positive pedal pulses. Neuro: Grossly intact, nonfocal.  Lab Results: Basic Metabolic Panel:  Basename 09/16/11 0931 09/15/11 1616  NA 134* 136  K 3.4* 4.1  CL 94* 100  CO2 25 23  GLUCOSE 151* 156*  BUN 18 14  CREATININE 1.01 1.16*  CALCIUM 9.8 9.5  MG -- --  PHOS -- --   CBC:  Basename 09/16/11 0931 09/15/11 1616  WBC 4.9 6.0  NEUTROABS -- 4.1  HGB 10.2* 6.7*  HCT 28.9* 20.5*  MCV 91.5 96.7  PLT 247 228   Cardiac Enzymes:  Basename 09/16/11 0931 09/15/11 2130 09/15/11 1617  CKTOTAL 43 32 --  CKMB 1.2 1.2 --  CKMBINDEX -- -- --  TROPONINI <0.30 <0.30 <0.30   CBG:  Basename 09/16/11 1059 09/16/11 0615 09/15/11 2122  GLUCAP 145* 186* 125*   Hemoglobin A1C:  Basename 09/15/11 2124  HGBA1C 5.1   Fasting Lipid Panel:  Basename 09/16/11 0931  CHOL 176  HDL 68  LDLCALC 90  TRIG 92  CHOLHDL 2.6  LDLDIRECT --   Studies/Results: Dg Chest 2 View  09/15/2011   *RADIOLOGY REPORT*  Clinical Data: Shortness of breath and lightheadedness  CHEST - 2 VIEW  Comparison: 08/08/2011  Findings: Heart size is normal.  No pleural effusion or interstitial edema identified.  Bilateral upper lobe airspace opacities appear more confluent when compared with the previous examination.  In the setting of external beam radiation these findings may represent areas of progressive radiation change with consolidation and fibrosis.  Superimposed infection cannot be excluded.  The lower lobes are clear.  The visualized osseous structures are unremarkable.  IMPRESSION:  1.  Progressive bilateral upper lobe confluents and consolidation. In the setting of external beam radiation this is nonspecific and may reflect radiation change.  Superimposed infection not excluded.  Original Report Authenticated By: Rosealee Albee, M.D.   Ct Angio Chest W/cm &/or Wo Cm  09/16/2011  *RADIOLOGY REPORT*  Clinical Data: History of lung cancer.  Concern for pulmonary embolism.  Shortness of breath.  Weakness.  CT.  Left upper lobectomy for adenocarcinoma.  Right upper lobectomy for non-small cell lung cancer.  Most recent chemotherapy 2 weeks ago.  CT ANGIOGRAPHY CHEST  Technique:  Multidetector CT imaging of the chest using the standard protocol during bolus administration of intravenous contrast. Multiplanar reconstructed images including MIPs were obtained and reviewed to evaluate the vascular anatomy.  Contrast: OMNIPAQUE IOHEXOL 350 MG/ML SOLN  Comparison: Plain film of 09/15/2011.  CT of 08/23/2011.  Findings: Lung  windows demonstrate mild motion degradation, primarily superiorly.  Surgical changes of the left upper lobectomy.  Probable volume loss at the anterior right lung base; image 52.  A new finding. Similar 6 mm ground-glass nodule at the anterior left lung base on image 39.  Progressive airspace disease with air bronchograms in the posterior superior left lung on image 31.  The right upper lobe  lung nodule is now nearly entirely obscured by surrounding airspace disease.  Likely measures 2.5 x 2.1 cm on image 28 versus 2.7 x 2.3 cm at the same level on the prior.  The right middle lobe lesion is not well visualized.  Increased posterior right upper lobe airspace disease with concurrent bronchiectasis.  This is at the site of septal thickening and ground-glass opacity on the prior exam.  Soft tissue windows:  The quality of this exam for evaluation of pulmonary embolism is good to excellent.  The bolus is well timed. No evidence of pulmonary embolism.  A right supraclavicular node measures 8 mm versus 7 mm on the prior.  Not pathologic by size criteria.  Aortic atherosclerosis without dissection or aneurysm.  Mild cardiomegaly.  Resolved pericardial effusion.  Small right pleural effusion is new or increased.No mediastinal or hilar adenopathy. Fluid filled esophagus with mild dilatation.  Limited abdominal imaging demonstrates similar bilateral adrenal thickening.  Subtle sclerosis at the left humeral head is likely related to avascular necrosis on image 1.  This is present back to 03/29/2011. No acute osseous abnormality.  IMPRESSION:  1. No evidence of pulmonary embolism. 2.  Progressive posterior bilateral pulmonary opacities with areas of bronchiectasis.  Most likely evolving radiation change. Concurrent infection is difficult to exclude. 3.  The right upper lobe nodule is nearly completely obscured by overlying airspace disease but felt to be slightly smaller.  The right middle lobe lesion is no longer readily identified. 4.  Slight increase in small right pleural effusion. 5.  Esophageal dilatation and fluid suggests dysmotility or gastroesophageal reflux. 6.  Left humeral head avascular necrosis. 7.  Minimally enlarged right supraclavicular node.  Although not pathologic by size criteria, this warrants followup attention.  Original Report Authenticated By: Consuello Bossier, M.D.     Medications: Scheduled Meds:   . antiseptic oral rinse  15 mL Mouth Rinse BID  . citalopram  20 mg Oral Daily  . diltiazem  240 mg Oral Daily  . diphenhydrAMINE  50 mg Oral Once  . furosemide  20 mg Intravenous Once  . gemfibrozil  600 mg Oral Daily  . insulin aspart  0-9 Units Subcutaneous TID WC  . loratadine  10 mg Oral Daily  . pantoprazole  40 mg Oral Daily  . predniSONE  50 mg Oral Once  . predniSONE  50 mg Oral Once  . predniSONE  50 mg Oral Once  . sodium chloride  3 mL Intravenous Q12H  . sucralfate  1 g Oral QID   Continuous Infusions:   . sodium chloride 20 mL/hr at 09/16/11 0700   PRN Meds:.acetaminophen, acetaminophen, albuterol, albuterol-ipratropium, ALPRAZolam, HYDROcodone-acetaminophen, iohexol, morphine injection, ondansetron (ZOFRAN) IV, ondansetron, polyethylene glycol, prochlorperazine, traMADol, DISCONTD: HYDROcodone-acetaminophen  Assessment/Plan: 1-SOB (shortness of breath): most likely associated with acute symptomatic anemia. Cardiac enxymes, EKG and telemetry assessment ruling out so far any cardiac component. CTA has also r/o PE as cause for her SOB. Will follow CBC in am; also B12 and ferritin level. Further workup per oncology service as an outpatient. Most likely home tomorrow.  2-Anemia: most likely associated  with chemotherapy and bone marrow suppression; Hgb 10.2 after 2 units of PRBC's given on 09/15/11. Will follow CBC in am; continue PPI; follow occult blod test card and follow B12 and ferritin level.   3-Chronic resp failure: continue inhaler and nebs treatment as indicated at home and continue oxygen supplementation.   4-T1N0 AdenoCA of RUL: oncology consulted by ED; will follow any recommendations. Patient receiving radiation and chemotherapy treatment. (last one on hold for the last month or so according to patient). Plan is for outpatient follow up by oncology.  5-Weakness generalized: secondary to anemia. Better after  transfusion.  6-HYPERLIPIDEMIA: continue gemfibrozil. Lipid panel WNL  7-DM: will continue SSI inside the hospital; A1C 5.1; patient will not required hypoglycemic agent and just diet control at this point.  8-ARI: Better after transfusion; will follow BMET; continue holding ARB for now.  9-Hypokalemia: will replete.  DVT: SCD's      LOS: 1 day   Udell Mazzocco [facility] [phone]  09/16/2011, 12:25 PM

## 2011-09-16 NOTE — Care Management Note (Unsigned)
    Page 1 of 2   09/17/2011     10:41:05 AM   CARE MANAGEMENT NOTE 09/17/2011  Patient:  [name], [name]   Account Number:  [id number]  Date Initiated:  09/16/2011  Documentation initiated by:  SIMMONS,Ronique Simerly  Subjective/Objective Assessment:   ADMITTED WITH SYMPTOMATIC ANEMIA; LIVES AT HOME ALONE; HAS AIDE FROM [facility] M-F 6:15-8:15; HAS DME- O2 AT HOME FROM [facility], R/W, CANE; USES [facility].     Action/Plan:   DISCHARGE PLANNING DISCUSSED; VOICED DESIRE TO GO TO ALF OR SNF AT D/C.   Anticipated DC Date:  09/17/2011   Anticipated DC Plan:  HOME W HOME HEALTH SERVICES  In-house referral  Clinical Social Worker      DC Associate Professor  CM consult      PAC Choice  DURABLE MEDICAL EQUIPMENT  HOME HEALTH   Choice offered to / List presented to:  C-1 Patient   DME arranged  3-N-1  HOSPITAL BED      DME agency  [facility]     HH arranged  HH-1 RN  HH-2 PT      [facility] agency  [facility]   Status of service:  In process, will continue to follow Medicare Important Message given?   (If response is "NO", the following Medicare IM given date fields will be blank) Date Medicare IM given:   Date Additional Medicare IM given:    Discharge Disposition:    Per UR Regulation:  Reviewed for med. necessity/level of care/duration of stay  If discussed at Long Length of Stay Meetings, dates discussed:    Comments:  09/17/11  1038  Caeden Foots SIMMONS RN, BSN [phone] DISCUSSED DISCHARGE PLANNING WITH PT; PREFERS [facility] FOR HHRN/ PT; ALSO NEEDS BSC AND HOSPITAL BED; REFERRAL PLACED TO [facility] WITH [facility] FOR HH AND REFERRAL PLACED TO [facility] FOR DME. MD- PLEASE DOCUMENT IN PROGRESS NOTES WHAT IS ON STICKY NOTE.   09/16/11  1457  Taevyn Hausen SIMMONS RN, BSN [phone] CSW SPOKE WITH PT AND SHE EXPRESSED DESIRE TO RETURN HOME WITH HH; PT WOULD BENEFIT FROM PALLIATIVE CARE C/S; NCM CONTACTED [facility]- [phone] BUT THE WAITING LIST IS OVER 200 AND CAN'T MAKE HER  A PRIORITY; WILL AWAIT GOC MEETING IN ORDER TO PROCEED WITH D/C PLANNING.  09/16/11  1153  Trichelle Lehan SIMMONS RN, BSN [phone] VERBAL CONSULT PLACED TO CSW; NCM WILL FOLLOW.

## 2011-09-16 NOTE — Clinical Social Work Psychosocial (Signed)
     Clinical Social Work Department BRIEF PSYCHOSOCIAL ASSESSMENT 09/16/2011  Patient:  [name], [name]     Account Number:  [id number]     Admit date:  09/15/2011  Clinical Social Worker:  Robin Searing  Date/Time:  09/16/2011 01:00 AM  Referred by:  Care Management  Date Referred:  09/16/2011 Referred for  ALF Placement   Other Referral:   Interview type:  Patient Other interview type:    PSYCHOSOCIAL DATA Living Status:  ALONE Admitted from facility:   Level of care:   Primary support name:  daughter Primary support relationship to patient:  FAMILY Degree of support available:   minimal    CURRENT CONCERNS Current Concerns  Post-Acute Placement   Other Concerns:   Patient reports that she is currently getting chemo/radiation. She is realistic with her situation and would like to look into Hospice and/or Palliative Care- "if I stop taking these treatments I will be dying." She says the treatments seem to cause her platelets to drop and then she has to get transfusions.  SHe is open to the thought of speaking with the Palliative Team for goals of care    SOCIAL WORK ASSESSMENT / PLAN Will ask MD to consult PALLIATIVE TEAM to meet with patient for goals of care- she does plan to go home at d/c and declines ALF placment at this time.  SHe has a caregiver 55 hours week and might benefit from [facility] services as well.   Assessment/plan status:  Other - See comment Other assessment/ plan:   Will ask MD for Palliative Team consult for goals of care meeting with patient   Information/referral to community resources:   Palliative Team Consult    PATIENTS/FAMILYS RESPONSE TO PLAN OF CARE: Patient is somewhat fearful of having another stroke- she acknoweldges this during our talk about d/c plans.

## 2011-09-16 NOTE — ED Provider Notes (Signed)
Medical screening examination/treatment/procedure(s) were conducted as a shared visit with non-physician practitioner(s) and myself.  I personally evaluated the patient during the encounter Patient with new weakness and shortness of breath and found to be anemic at her PCP office. Unclear where the anemia is coming from it is not from her chemotherapy.  Gwyneth Sprout, MD 09/16/11 2324

## 2011-09-17 DIAGNOSIS — E1165 Type 2 diabetes mellitus with hyperglycemia: Secondary | ICD-10-CM

## 2011-09-17 DIAGNOSIS — E118 Type 2 diabetes mellitus with unspecified complications: Secondary | ICD-10-CM | POA: Diagnosis not present

## 2011-09-17 DIAGNOSIS — C3410 Malignant neoplasm of upper lobe, unspecified bronchus or lung: Secondary | ICD-10-CM

## 2011-09-17 DIAGNOSIS — R112 Nausea with vomiting, unspecified: Secondary | ICD-10-CM

## 2011-09-17 DIAGNOSIS — R0602 Shortness of breath: Secondary | ICD-10-CM

## 2011-09-17 LAB — CBC
HCT: 27 % — ABNORMAL LOW (ref 36.0–46.0)
Hemoglobin: 9.6 g/dL — ABNORMAL LOW (ref 12.0–15.0)
MCH: 32.9 pg (ref 26.0–34.0)
MCHC: 35.6 g/dL (ref 30.0–36.0)

## 2011-09-17 LAB — VITAMIN B12: Vitamin B-12: 473 pg/mL (ref 211–911)

## 2011-09-17 LAB — TYPE AND SCREEN
Antibody Screen: POSITIVE
Donor AG Type: NEGATIVE
Unit division: 0

## 2011-09-17 LAB — FERRITIN: Ferritin: 1275 ng/mL — ABNORMAL HIGH (ref 10–291)

## 2011-09-17 MED ORDER — TRIAMTERENE-HCTZ 37.5-25 MG PO TABS: 0.5000 | ORAL_TABLET | Freq: Every day | ORAL | Status: DC

## 2011-09-17 MED ORDER — ASPIRIN EC 325 MG PO TBEC: DELAYED_RELEASE_TABLET | ORAL | Status: DC

## 2011-09-17 NOTE — Discharge Summary (Signed)
Physician Discharge Summary  Patient ID: [name] MRN: [id number] DOB/AGE: Jul 23, 1945 66 y.o.  Admit date: 09/15/2011 Discharge date: 09/17/2011  Primary Care Physician:  [name]., MD, MD   Discharge Diagnoses:   .SOB (shortness of breath) .Weakness generalized .Anemia .T1N0 AdenoCA of RUL .HYPERTENSION .HYPERLIPIDEMIA .DIABETES MELLITUS, TYPE II .COPD   Medication List  As of 09/17/2011 11:38 AM   STOP taking these medications         losartan 50 MG tablet      metFORMIN 500 MG tablet         TAKE these medications         albuterol (2.5 MG/3ML) 0.083% nebulizer solution   Commonly known as: PROVENTIL   Take 3 mLs (2.5 mg total) by nebulization every 6 (six) hours as needed for wheezing or shortness of breath.      albuterol-ipratropium 18-103 MCG/ACT inhaler   Commonly known as: COMBIVENT   Inhale 1 puff into the lungs every 6 (six) hours as needed. For shortness of breath      ALPRAZolam 1 MG tablet   Commonly known as: XANAX   Take 1 mg by mouth 3 (three) times daily as needed. For anxiety      aspirin EC 325 MG tablet   Hold for 1 week then resume taking one tablet by mouth daily      citalopram 40 MG tablet   Commonly known as: CELEXA   Take 0.5 tablets (20 mg total) by mouth daily.      dexamethasone 4 MG tablet   Commonly known as: DECADRON   Take 4 mg by mouth 2 (two) times daily with a meal. Take 1 tablet twice a day the day before, the day of and the day after chemotherapy.      diltiazem 240 MG 24 hr capsule   Commonly known as: CARDIZEM CD   Take 1 capsule (240 mg total) by mouth daily.      gemfibrozil 600 MG tablet   Commonly known as: LOPID   Take 600 mg by mouth daily.      HYDROcodone-acetaminophen 7.5-750 MG per tablet   Commonly known as: VICODIN ES   Take 1 tablet by mouth every 6 (six) hours as needed. For severe pain      loratadine 10 MG tablet   Commonly known as: CLARITIN   Take 1 tablet (10 mg total) by mouth  daily.      magic mouthwash w/lidocaine Soln   Take 5 mLs by mouth 4 (four) times daily as needed.      pantoprazole 40 MG tablet   Commonly known as: PROTONIX   Take 1 tablet (40 mg total) by mouth daily.      polyethylene glycol packet   Commonly known as: MIRALAX / GLYCOLAX   Take 17 g by mouth daily as needed. For constipation      potassium chloride SA 20 MEQ tablet   Commonly known as: K-DUR,KLOR-CON   Take 1 tablet (20 mEq total) by mouth 2 (two) times daily.      prochlorperazine 10 MG tablet   Commonly known as: COMPAZINE   Take 10 mg by mouth every 6 (six) hours as needed. For nausea      senna 8.6 MG Tabs   Commonly known as: SENOKOT   Take 1 tablet by mouth daily as needed. For constipation      sucralfate 1 G tablet   Commonly known as: CARAFATE   Take 1 tablet (  1 g total) by mouth 4 (four) times daily. Dissolve in 15 ml water.      traMADol 50 MG tablet   Commonly known as: ULTRAM   Take 50 mg by mouth every 4 (four) hours as needed. For moderate pain      triamcinolone ointment 0.1 %   Commonly known as: KENALOG   Apply topically 2 (two) times daily.      triamterene-hydrochlorothiazide 37.5-25 MG per tablet   Commonly known as: MAXZIDE-25   Take 0.5 each (0.5 tablets total) by mouth daily.             Disposition and Follow-up:  Patient discharge in stable and improved condition; currently no complaining of significant SOB or weakness. Symptoms associated to anemia; she received 2 units of PRBC's during this admission and her Hgb is now stable at 9.6; she will follow with oncologist in 5 days for further evaluation and treatment. Patient A1C was 5.1; reason why her Metformin was discontinue; also her ARB was discontinue and HCTZ cut in half due toBP been soft and mild renal insufficiency on admission; PCP to follow on these chronic problems and made proper adjustments. Patient also mentioned interest in discussing GOC with palliative team; but will like  to discuss with oncologist first about further treatments and life expectancy.  Consults:   None   Significant Diagnostic Studies:  Dg Chest 2 View  09/15/2011  *RADIOLOGY REPORT*  Clinical Data: Shortness of breath and lightheadedness  CHEST - 2 VIEW  Comparison: 08/08/2011  Findings: Heart size is normal.  No pleural effusion or interstitial edema identified.  Bilateral upper lobe airspace opacities appear more confluent when compared with the previous examination.  In the setting of external beam radiation these findings may represent areas of progressive radiation change with consolidation and fibrosis.  Superimposed infection cannot be excluded.  The lower lobes are clear.  The visualized osseous structures are unremarkable.  IMPRESSION:  1.  Progressive bilateral upper lobe confluents and consolidation. In the setting of external beam radiation this is nonspecific and may reflect radiation change.  Superimposed infection not excluded.  Original Report Authenticated By: Rosealee Albee, M.D.   Ct Angio Chest W/cm &/or Wo Cm  09/16/2011  *RADIOLOGY REPORT*  Clinical Data: History of lung cancer.  Concern for pulmonary embolism.  Shortness of breath.  Weakness.  CT.  Left upper lobectomy for adenocarcinoma.  Right upper lobectomy for non-small cell lung cancer.  Most recent chemotherapy 2 weeks ago.  CT ANGIOGRAPHY CHEST  Technique:  Multidetector CT imaging of the chest using the standard protocol during bolus administration of intravenous contrast. Multiplanar reconstructed images including MIPs were obtained and reviewed to evaluate the vascular anatomy.  Contrast: OMNIPAQUE IOHEXOL 350 MG/ML SOLN  Comparison: Plain film of 09/15/2011.  CT of 08/23/2011.  Findings: Lung windows demonstrate mild motion degradation, primarily superiorly.  Surgical changes of the left upper lobectomy.  Probable volume loss at the anterior right lung base; image 52.  A new finding. Similar 6 mm ground-glass nodule  at the anterior left lung base on image 39.  Progressive airspace disease with air bronchograms in the posterior superior left lung on image 31.  The right upper lobe lung nodule is now nearly entirely obscured by surrounding airspace disease.  Likely measures 2.5 x 2.1 cm on image 28 versus 2.7 x 2.3 cm at the same level on the prior.  The right middle lobe lesion is not well visualized.  Increased posterior right  upper lobe airspace disease with concurrent bronchiectasis.  This is at the site of septal thickening and ground-glass opacity on the prior exam.  Soft tissue windows:  The quality of this exam for evaluation of pulmonary embolism is good to excellent.  The bolus is well timed. No evidence of pulmonary embolism.  A right supraclavicular node measures 8 mm versus 7 mm on the prior.  Not pathologic by size criteria.  Aortic atherosclerosis without dissection or aneurysm.  Mild cardiomegaly.  Resolved pericardial effusion.  Small right pleural effusion is new or increased.No mediastinal or hilar adenopathy. Fluid filled esophagus with mild dilatation.  Limited abdominal imaging demonstrates similar bilateral adrenal thickening.  Subtle sclerosis at the left humeral head is likely related to avascular necrosis on image 1.  This is present back to 03/29/2011. No acute osseous abnormality.  IMPRESSION:  1. No evidence of pulmonary embolism. 2.  Progressive posterior bilateral pulmonary opacities with areas of bronchiectasis.  Most likely evolving radiation change. Concurrent infection is difficult to exclude. 3.  The right upper lobe nodule is nearly completely obscured by overlying airspace disease but felt to be slightly smaller.  The right middle lobe lesion is no longer readily identified. 4.  Slight increase in small right pleural effusion. 5.  Esophageal dilatation and fluid suggests dysmotility or gastroesophageal reflux. 6.  Left humeral head avascular necrosis. 7.  Minimally enlarged right  supraclavicular node.  Although not pathologic by size criteria, this warrants followup attention.  Original Report Authenticated By: Consuello Bossier, M.D.    Brief H and P: For complete details please refer to admission H and P, but in brief; 66 year old female with a past medical history significant for diabetes, hypertension, hyperlipidemia, chronic respiratory failure secondary to COPD and non-small cell lung cancer; come into the hospital complaining of about 2 weeks history of shortness of breath, generalized weakness and fatigue. Patient reports that the shortness of breath has continuously worsening throughout this time and at this moment is making impossible for her to do any kind of activity. Patient states that she has been feeling also dizzy and lightheaded. She reports that about 2 months ago she requires admission into the hospital secondary to severe symptomatic anemia and also thrombocytopenia (abdominal mentioned diagnoses was attribute it to the use of chemotherapy agents); patient reports that she had been out of chemotherapy treatment for about a month or so and denies any hematemesis, melena, or any other acute signs of bleeding.    Hospital Course:  1-SOB (shortness of breath): most likely associated with acute symptomatic anemia. Cardiac enxymes, EKG and telemetry assessment ruling out any cardiac component. CTA has also r/o PE as cause for her SOB. B12 WNL and ferritin demonstrating anemia of chronic disease; at discharge Hgb stable at 9.6 and no signs of bleeding. She will follow with oncologist for further evaluation and treatment in the next 5 days.   2-Anemia: most likely associated with chemotherapy and bone marrow suppression; Hgb 9.6 after 2 units of PRBC's given on 09/15/11. Will recommend follow up with Hem/Onc for further treatment.   3-Chronic resp failure: continue inhaler and nebs treatment as indicated at home and continue oxygen supplementation. No  wheezing.  4-T1N0 AdenoCA of RUL: oncology consulted by ED; will follow any recommendations. Patient receiving radiation and chemotherapy treatment. (last one on hold for the last month or so according to patient). Plan is for outpatient follow up by oncology. Patient also interested in palliative care; will let oncology service to discuss  this possibility at length with patient.  5-Weakness generalized: secondary to anemia. Better after transfusion. HHPT and also HHRN has been arranged to continue providing conditioning and care assistance.   6-HYPERLIPIDEMIA: continue gemfibrozil. Lipid panel WNL   7-DM: will discontinue metformin at this point and will ask patient to follow a low carb diet. A1C 5.1  8-ARI: Back to baseline. Will cut HCTZ to half of regular dose and will stop ARB. Further treatment as indicated by PCP; BP has remains soft despite this changes.  9-HTN: stable but soft; doubt she will required that much antihypertensive agents anymore. Advised to follow a low sodium diet and will follow with PCP for further medication adjustments as needed. No further lightheadedness or dizziness .  10-GERD: continue PPI and carafate; patient will also required her head to be elevated at 30 degrees as part of treatment; currently due to generalized body pain due to arthritis; unable to achieved it on regular bed; will request Hospital bed for her. Her mobility is also compromised and will benefit of the bed for changing position and avoiding pressure sores.  9-Hypokalemia: repleted.    Time spent on Discharge: 45 minutes  Signed: Cailean Heacock 09/17/2011, 11:38 AM

## 2011-09-17 NOTE — Progress Notes (Signed)
Patient is now declining SNF/ALF and wishes to d/c home with [facility] services. Chart reviewed and have spoken with RNCM who is arranging HH and DME. CSW to sign off- please contact us if SW needs arise. Reece Levy, MSW, Theresia Majors [phone]

## 2011-09-19 DIAGNOSIS — T451X5A Adverse effect of antineoplastic and immunosuppressive drugs, initial encounter: Secondary | ICD-10-CM | POA: Diagnosis not present

## 2011-09-19 DIAGNOSIS — J449 Chronic obstructive pulmonary disease, unspecified: Secondary | ICD-10-CM | POA: Diagnosis not present

## 2011-09-19 DIAGNOSIS — M6281 Muscle weakness (generalized): Secondary | ICD-10-CM | POA: Diagnosis not present

## 2011-09-19 DIAGNOSIS — C3490 Malignant neoplasm of unspecified part of unspecified bronchus or lung: Secondary | ICD-10-CM | POA: Diagnosis not present

## 2011-09-19 DIAGNOSIS — I10 Essential (primary) hypertension: Secondary | ICD-10-CM | POA: Diagnosis not present

## 2011-09-19 DIAGNOSIS — D6481 Anemia due to antineoplastic chemotherapy: Secondary | ICD-10-CM | POA: Diagnosis not present

## 2011-09-19 DIAGNOSIS — E119 Type 2 diabetes mellitus without complications: Secondary | ICD-10-CM | POA: Diagnosis not present

## 2011-09-20 DIAGNOSIS — M545 Low back pain: Secondary | ICD-10-CM | POA: Diagnosis not present

## 2011-09-20 DIAGNOSIS — M47817 Spondylosis without myelopathy or radiculopathy, lumbosacral region: Secondary | ICD-10-CM | POA: Diagnosis not present

## 2011-09-20 DIAGNOSIS — M159 Polyosteoarthritis, unspecified: Secondary | ICD-10-CM | POA: Diagnosis not present

## 2011-09-21 ENCOUNTER — Telehealth: Payer: Self-pay | Admitting: Emergency Medicine

## 2011-09-21 ENCOUNTER — Telehealth: Payer: Self-pay | Admitting: Medical Oncology

## 2011-09-21 NOTE — Telephone Encounter (Signed)
Records faxed per pt request

## 2011-09-21 NOTE — Telephone Encounter (Signed)
Called [employer], spoke with [place].  Was advised Amber is in a mtg but Tamela Oddi states she will Neurosurgeon we returned her call and have her call back.

## 2011-09-22 ENCOUNTER — Encounter: Payer: Self-pay | Admitting: Neurosurgery

## 2011-09-22 ENCOUNTER — Encounter: Payer: Self-pay | Admitting: *Deleted

## 2011-09-22 NOTE — Telephone Encounter (Signed)
Pt was in hospital on 6-513 and PT was ordered by attending. Pt is refusing to have PT due to her Lung Cancer and endurance. Amber wanted to make doctor aware. I am going to send as an FYI to Dr. Delton Coombes because the pt switched from [facility] to RB. Pt has an appt with RB on 10-26-11. Carron Curie, CMA

## 2011-09-22 NOTE — Progress Notes (Signed)
Pt called stating she had given the wrong name yesterday when she asked for medical records to get faxed to [facility] sr care.  MD at [facility] is Dr. Chong Sicilian and her fax # is [phone], ph# [phone].  Records re-faxed to Attn: Dr Janae Bridgeman.  SLJ

## 2011-09-22 NOTE — Telephone Encounter (Signed)
Thank you :)

## 2011-09-23 ENCOUNTER — Ambulatory Visit (INDEPENDENT_AMBULATORY_CARE_PROVIDER_SITE_OTHER): Payer: Medicare HMO | Admitting: *Deleted

## 2011-09-23 ENCOUNTER — Ambulatory Visit (INDEPENDENT_AMBULATORY_CARE_PROVIDER_SITE_OTHER): Payer: Medicare Other | Admitting: Neurosurgery

## 2011-09-23 ENCOUNTER — Encounter: Payer: Self-pay | Admitting: Neurosurgery

## 2011-09-23 VITALS — BP 150/73 | HR 108 | Resp 18 | Ht 66.0 in | Wt 160.0 lb

## 2011-09-23 DIAGNOSIS — I739 Peripheral vascular disease, unspecified: Secondary | ICD-10-CM

## 2011-09-23 DIAGNOSIS — I70219 Atherosclerosis of native arteries of extremities with intermittent claudication, unspecified extremity: Secondary | ICD-10-CM

## 2011-09-23 DIAGNOSIS — I10 Essential (primary) hypertension: Secondary | ICD-10-CM | POA: Diagnosis not present

## 2011-09-23 DIAGNOSIS — E119 Type 2 diabetes mellitus without complications: Secondary | ICD-10-CM | POA: Diagnosis not present

## 2011-09-23 DIAGNOSIS — C3490 Malignant neoplasm of unspecified part of unspecified bronchus or lung: Secondary | ICD-10-CM | POA: Diagnosis not present

## 2011-09-23 DIAGNOSIS — I7025 Atherosclerosis of native arteries of other extremities with ulceration: Secondary | ICD-10-CM | POA: Insufficient documentation

## 2011-09-23 DIAGNOSIS — M6281 Muscle weakness (generalized): Secondary | ICD-10-CM | POA: Diagnosis not present

## 2011-09-23 DIAGNOSIS — J449 Chronic obstructive pulmonary disease, unspecified: Secondary | ICD-10-CM | POA: Diagnosis not present

## 2011-09-23 DIAGNOSIS — D6481 Anemia due to antineoplastic chemotherapy: Secondary | ICD-10-CM | POA: Diagnosis not present

## 2011-09-23 NOTE — Progress Notes (Signed)
[facility] [place] HISTORY AND PHYSICAL   CC: Annual ABIs for lower extremity PVD Referring Physician: Fields  History of Present Illness: 66 year old female patient of Dr. Darrick Penna seen for followup for her known PVD. Patient states that her lower extremity pain is minimal. She's been diagnosed with lung cancer and is under treatment for that although she is in a rest period from chemotherapy right now. The patient denies true claudication, rest pain and has no open wounds.  Past Medical History  Diagnosis Date  . Polyneuropathy in diabetes   . Restless legs syndrome (RLS)   . Chronic sinusitis   . Hypertension   . Hyperlipidemia   . Anemia, unspecified   . GERD (gastroesophageal reflux disease)   . Respiratory failure with hypoxia 01/02/2010  . Complication of anesthesia     slow to wake up  . Asthma   . Coronary artery disease     Dr. Excell Seltzer had stress test done  . Type II or unspecified type diabetes mellitus without mention of complication, not stated as uncontrolled   . Peripheral vascular disease   . PAD (peripheral artery disease)   . Heart murmur   . Dysrhythmia     irregular  . Anginal pain   . History of blood clots     "2 in my aorta"  . Hard of hearing, right   . History of bronchitis   . Bronchial pneumonia     history  . COPD (chronic obstructive pulmonary disease)   . Emphysema   . Shortness of breath     "anytime really"  . Syncope and collapse   . Adenocarcinoma, lung     upper left lobe  . 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 Chemotherapy adverse reaction 09/15/11    "makes me light headed and pass out; this is 3rd blood transfusion since going thru it"  . History of blood transfusion   . Kidney infection     "related to chemo"  . Sinus headache     "all the time"  . Arthritis     "all over my body"  . Fibromyalgia   . Osteoporosis   . DDD (degenerative disc disease), lumbar   .  Scoliosis   . Anxiety   . Mental disorder   . Reflux   . CHF (congestive heart failure)   . DVT (deep venous thrombosis)     ROS: [x]  Positive   [ ]  Denies    General: [ ]  Weight loss, [ ]  Fever, [ ]  chills Neurologic: [x ] Dizziness, [ ]  Blackouts, [ ]  Seizure [ ]  Stroke, [ ]  "Mini stroke", [ ]  Slurred speech, [ ]  Temporary blindness; [x ] weakness in arms or legs, [ ]  Hoarseness Cardiac: [ x] Chest pain/pressure, [x ] Shortness of breath at rest [x ] Shortness of breath with exertion, [ ]  Atrial fibrillation or irregular heartbeat Vascular: [ ]  Pain in legs with walking, [x ] Pain in legs at rest, [ ]  Pain in legs at night,  [ ]  Non-healing ulcer, [ ]  Blood clot in vein/DVT,   Pulmonary: [ x] Home oxygen, [ ]  Productive cough, [ ]  Coughing up blood, [x ] Asthma,  [x ] Wheezing Musculoskeletal:  [ ]  Arthritis, [ ]  Low back pain, [ ]  Joint pain Hematologic: [ ]  Easy Bruising, [ ]  Anemia; [ ]  Hepatitis Gastrointestinal: [ ]  Blood in stool, [ ]  Gastroesophageal Reflux/heartburn, [ ]  Trouble  swallowing Urinary: [ ]  chronic Kidney disease, [ ]  on HD - [ ]  MWF or [ ]  TTHS, [ ]  Burning with urination, [ ]  Difficulty urinating Skin: [ ]  Rashes, [ ]  Wounds Psychological: [ ]  Anxiety, [ ]  Depression   Social History History  Substance Use Topics  . Smoking status: Former Smoker -- 1.5 packs/day for 40 years    Types: Cigarettes    Quit date: 02/10/1997  . Smokeless tobacco: Never Used  . Alcohol Use: Yes     "last alcohol 1999"    Family History Family History  Problem Relation Age of Onset  . Bone cancer Father   . Cancer Father   . Diabetes Mother   . Deep vein thrombosis Mother   . Heart disease Mother     Heart Disease before age 37  . Hyperlipidemia Mother   . Hypertension Mother     Allergies  Allergen Reactions  . Iohexol Shortness Of Breath and Other (See Comments)    "burns me up inside"  . Other     CAN'T STAND THE SMELL OF PURE BLEACH; "HAVE TO BACK UP AND  POUR AND DON'T BREATH IT TIL i GET CAP BACK ON; DILUTE IT TO SPRAY"  . Penicillins Itching and Other (See Comments)    "scratchy; makes red marks on me, might be from my scratching"  . Codeine Itching and Nausea And Vomiting    REACTION: GI upset    Current Outpatient Prescriptions  Medication Sig Dispense Refill  . albuterol (PROVENTIL) (2.5 MG/3ML) 0.083% nebulizer solution Take 3 mLs (2.5 mg total) by nebulization every 6 (six) hours as needed for wheezing or shortness of breath.      . ALPRAZolam (XANAX) 1 MG tablet Take 1 mg by mouth 3 (three) times daily as needed. For anxiety      . Alum & Mag Hydroxide-Simeth (MAGIC MOUTHWASH W/LIDOCAINE) SOLN Take 5 mLs by mouth 4 (four) times daily as needed.  200 mL  0  . aspirin EC 325 MG tablet Hold for 1 week then resume taking one tablet by mouth daily      . citalopram (CELEXA) 40 MG tablet Take 0.5 tablets (20 mg total) by mouth daily.      Marland Kitchen diltiazem (CARDIZEM CD) 240 MG 24 hr capsule Take 1 capsule (240 mg total) by mouth daily.  30 capsule  6  . gemfibrozil (LOPID) 600 MG tablet Take 600 mg by mouth daily.       Marland Kitchen HYDROcodone-acetaminophen (VICODIN ES) 7.5-750 MG per tablet Take 1 tablet by mouth every 6 (six) hours as needed. For severe pain      . loratadine (CLARITIN) 10 MG tablet Take 1 tablet (10 mg total) by mouth daily.  30 tablet  3  . pantoprazole (PROTONIX) 40 MG tablet Take 1 tablet (40 mg total) by mouth daily.  30 tablet  1  . polyethylene glycol (MIRALAX / GLYCOLAX) packet Take 17 g by mouth daily as needed. For constipation      . potassium chloride SA (K-DUR,KLOR-CON) 20 MEQ tablet Take 1 tablet (20 mEq total) by mouth 2 (two) times daily.  60 tablet  6  . prochlorperazine (COMPAZINE) 10 MG tablet Take 10 mg by mouth every 6 (six) hours as needed. For nausea      . senna (SENOKOT) 8.6 MG TABS Take 1 tablet by mouth daily as needed. For constipation      . sucralfate (CARAFATE) 1 G tablet Take 1 tablet (1 g  total) by mouth 4  (four) times daily. Dissolve in 15 ml water.  90 tablet  1  . traMADol (ULTRAM) 50 MG tablet Take 50 mg by mouth every 4 (four) hours as needed. For moderate pain      . triamcinolone ointment (KENALOG) 0.1 % Apply topically 2 (two) times daily.      Marland Kitchen triamterene-hydrochlorothiazide (MAXZIDE-25) 37.5-25 MG per tablet Take 0.5 each (0.5 tablets total) by mouth daily.  30 tablet  6  . albuterol-ipratropium (COMBIVENT) 18-103 MCG/ACT inhaler Inhale 1 puff into the lungs every 6 (six) hours as needed. For shortness of breath  2 Inhaler  2  . dexamethasone (DECADRON) 4 MG tablet Take 4 mg by mouth 2 (two) times daily with a meal. Take 1 tablet twice a day the day before, the day of and the day after chemotherapy.       Marland Kitchen DISCONTD: calcium carbonate 200 MG capsule Take 250 mg by mouth daily. Does not take everyday       No current facility-administered medications for this visit.   Facility-Administered Medications Ordered in Other Visits  Medication Dose Route Frequency Provider Last Rate Last Dose  . hyaluronate sodium (RADIAPLEXRX) gel   Topical Once Lurline Hare, MD      . topical emolient (BIAFINE) emulsion   Topical PRN Lurline Hare, MD        Physical Examination  Filed Vitals:   09/23/11 1027  BP: 150/73  Pulse: 108  Resp: 18    Body mass index is 25.82 kg/(m^2).  General:  WDWN in NAD Gait: Normal HEENT: WNL Eyes: Pupils equal Pulmonary: normal non-labored breathing , without Rales, rhonchi,  wheezing Cardiac: RRR, without  Murmurs, rubs or gallops; No carotid bruits Abdomen: soft, NT, no masses Skin: no rashes, ulcers noted Vascular Exam/Pulses: 2+ PT and DP pulses bilaterally  Extremities without ischemic changes, no Gangrene , no cellulitis; no open wounds;  Musculoskeletal: no muscle wasting or atrophy  Neurologic: A&O X 3; Appropriate Affect ; SENSATION: normal; MOTOR FUNCTION:  moving all extremities equally. Speech is fluent/normal  Non-Invasive Vascular  Imaging: ABIs today are 1.03 on the right and biphasic to triphasic, 1.01 on the left and triphasic to biphasic  ASSESSMENT/PLAN: From a vascular standpoint she is asymptomatic and doing well, she will followup here in one year with repeat ABIs, her questions were encouraged and answered.  [name] ANP  Clinic M.D.: [facility]

## 2011-09-23 NOTE — Addendum Note (Signed)
Addended by: Sharee Pimple on: 09/23/2011 12:22 PM   Modules accepted: Orders

## 2011-09-24 DIAGNOSIS — T451X5A Adverse effect of antineoplastic and immunosuppressive drugs, initial encounter: Secondary | ICD-10-CM | POA: Diagnosis not present

## 2011-09-24 DIAGNOSIS — I10 Essential (primary) hypertension: Secondary | ICD-10-CM | POA: Diagnosis not present

## 2011-09-24 DIAGNOSIS — M6281 Muscle weakness (generalized): Secondary | ICD-10-CM | POA: Diagnosis not present

## 2011-09-24 DIAGNOSIS — C3490 Malignant neoplasm of unspecified part of unspecified bronchus or lung: Secondary | ICD-10-CM | POA: Diagnosis not present

## 2011-09-24 DIAGNOSIS — E119 Type 2 diabetes mellitus without complications: Secondary | ICD-10-CM | POA: Diagnosis not present

## 2011-09-24 DIAGNOSIS — J449 Chronic obstructive pulmonary disease, unspecified: Secondary | ICD-10-CM | POA: Diagnosis not present

## 2011-09-27 DIAGNOSIS — J449 Chronic obstructive pulmonary disease, unspecified: Secondary | ICD-10-CM | POA: Diagnosis not present

## 2011-09-27 DIAGNOSIS — I10 Essential (primary) hypertension: Secondary | ICD-10-CM | POA: Diagnosis not present

## 2011-09-27 DIAGNOSIS — C3490 Malignant neoplasm of unspecified part of unspecified bronchus or lung: Secondary | ICD-10-CM | POA: Diagnosis not present

## 2011-09-27 DIAGNOSIS — E119 Type 2 diabetes mellitus without complications: Secondary | ICD-10-CM | POA: Diagnosis not present

## 2011-09-27 DIAGNOSIS — D6481 Anemia due to antineoplastic chemotherapy: Secondary | ICD-10-CM | POA: Diagnosis not present

## 2011-09-27 DIAGNOSIS — M6281 Muscle weakness (generalized): Secondary | ICD-10-CM | POA: Diagnosis not present

## 2011-09-28 DIAGNOSIS — E119 Type 2 diabetes mellitus without complications: Secondary | ICD-10-CM | POA: Diagnosis not present

## 2011-09-28 DIAGNOSIS — H9190 Unspecified hearing loss, unspecified ear: Secondary | ICD-10-CM | POA: Diagnosis not present

## 2011-09-28 DIAGNOSIS — E875 Hyperkalemia: Secondary | ICD-10-CM | POA: Diagnosis not present

## 2011-09-28 DIAGNOSIS — D62 Acute posthemorrhagic anemia: Secondary | ICD-10-CM | POA: Diagnosis not present

## 2011-09-28 DIAGNOSIS — E559 Vitamin D deficiency, unspecified: Secondary | ICD-10-CM | POA: Diagnosis not present

## 2011-09-28 DIAGNOSIS — M899 Disorder of bone, unspecified: Secondary | ICD-10-CM | POA: Diagnosis not present

## 2011-09-28 DIAGNOSIS — M949 Disorder of cartilage, unspecified: Secondary | ICD-10-CM | POA: Diagnosis not present

## 2011-09-30 DIAGNOSIS — E119 Type 2 diabetes mellitus without complications: Secondary | ICD-10-CM | POA: Diagnosis not present

## 2011-09-30 DIAGNOSIS — J449 Chronic obstructive pulmonary disease, unspecified: Secondary | ICD-10-CM | POA: Diagnosis not present

## 2011-09-30 DIAGNOSIS — I10 Essential (primary) hypertension: Secondary | ICD-10-CM | POA: Diagnosis not present

## 2011-09-30 DIAGNOSIS — M6281 Muscle weakness (generalized): Secondary | ICD-10-CM | POA: Diagnosis not present

## 2011-09-30 DIAGNOSIS — C3490 Malignant neoplasm of unspecified part of unspecified bronchus or lung: Secondary | ICD-10-CM | POA: Diagnosis not present

## 2011-09-30 DIAGNOSIS — D6481 Anemia due to antineoplastic chemotherapy: Secondary | ICD-10-CM | POA: Diagnosis not present

## 2011-10-04 ENCOUNTER — Telehealth: Payer: Self-pay | Admitting: Internal Medicine

## 2011-10-04 NOTE — Telephone Encounter (Signed)
pt had called and i called he back to verify appts  aom

## 2011-10-07 DIAGNOSIS — M6281 Muscle weakness (generalized): Secondary | ICD-10-CM | POA: Diagnosis not present

## 2011-10-07 DIAGNOSIS — E119 Type 2 diabetes mellitus without complications: Secondary | ICD-10-CM | POA: Diagnosis not present

## 2011-10-07 DIAGNOSIS — D6481 Anemia due to antineoplastic chemotherapy: Secondary | ICD-10-CM | POA: Diagnosis not present

## 2011-10-07 DIAGNOSIS — C3490 Malignant neoplasm of unspecified part of unspecified bronchus or lung: Secondary | ICD-10-CM | POA: Diagnosis not present

## 2011-10-07 DIAGNOSIS — J449 Chronic obstructive pulmonary disease, unspecified: Secondary | ICD-10-CM | POA: Diagnosis not present

## 2011-10-07 DIAGNOSIS — I10 Essential (primary) hypertension: Secondary | ICD-10-CM | POA: Diagnosis not present

## 2011-10-21 DIAGNOSIS — M62838 Other muscle spasm: Secondary | ICD-10-CM | POA: Diagnosis not present

## 2011-10-22 ENCOUNTER — Telehealth: Payer: Self-pay | Admitting: Medical Oncology

## 2011-10-22 DIAGNOSIS — Z91041 Radiographic dye allergy status: Secondary | ICD-10-CM

## 2011-10-22 MED ORDER — PREDNISONE 50 MG PO TABS: ORAL_TABLET | ORAL | Status: DC

## 2011-10-22 MED ORDER — DIPHENHYDRAMINE HCL 50 MG PO TABS: 50.0000 mg | ORAL_TABLET | Freq: Once | ORAL | Status: DC

## 2011-10-22 NOTE — Telephone Encounter (Signed)
States she is allergic to CT dye and needs steroid premed. I called in Rx to [facility]

## 2011-10-25 ENCOUNTER — Telehealth: Payer: Self-pay | Admitting: *Deleted

## 2011-10-25 ENCOUNTER — Other Ambulatory Visit (HOSPITAL_BASED_OUTPATIENT_CLINIC_OR_DEPARTMENT_OTHER): Payer: Medicare HMO | Admitting: Lab

## 2011-10-25 ENCOUNTER — Ambulatory Visit (HOSPITAL_COMMUNITY)
Admission: RE | Admit: 2011-10-25 | Discharge: 2011-10-25 | Disposition: A | Discharge status: Home / Self Care | Payer: Medicare HMO | Source: Ambulatory Visit | Attending: Internal Medicine | Admitting: Internal Medicine

## 2011-10-25 DIAGNOSIS — C3490 Malignant neoplasm of unspecified part of unspecified bronchus or lung: Secondary | ICD-10-CM

## 2011-10-25 DIAGNOSIS — I700 Atherosclerosis of aorta: Secondary | ICD-10-CM | POA: Diagnosis not present

## 2011-10-25 DIAGNOSIS — J90 Pleural effusion, not elsewhere classified: Secondary | ICD-10-CM | POA: Diagnosis not present

## 2011-10-25 DIAGNOSIS — I2510 Atherosclerotic heart disease of native coronary artery without angina pectoris: Secondary | ICD-10-CM | POA: Diagnosis not present

## 2011-10-25 DIAGNOSIS — J438 Other emphysema: Secondary | ICD-10-CM | POA: Insufficient documentation

## 2011-10-25 DIAGNOSIS — Z902 Acquired absence of lung [part of]: Secondary | ICD-10-CM | POA: Diagnosis not present

## 2011-10-25 DIAGNOSIS — E279 Disorder of adrenal gland, unspecified: Secondary | ICD-10-CM | POA: Insufficient documentation

## 2011-10-25 DIAGNOSIS — Z923 Personal history of irradiation: Secondary | ICD-10-CM | POA: Insufficient documentation

## 2011-10-25 LAB — CBC WITH DIFFERENTIAL/PLATELET
Basophils Absolute: 0 10*3/uL (ref 0.0–0.1)
EOS%: 0 % (ref 0.0–7.0)
Eosinophils Absolute: 0 10*3/uL (ref 0.0–0.5)
HCT: 27.9 % — ABNORMAL LOW (ref 34.8–46.6)
HGB: 9.3 g/dL — ABNORMAL LOW (ref 11.6–15.9)
MCH: 32.5 pg (ref 25.1–34.0)
MCV: 97.4 fL (ref 79.5–101.0)
MONO%: 1.8 % (ref 0.0–14.0)
NEUT#: 4.9 10*3/uL (ref 1.5–6.5)
NEUT%: 88.5 % — ABNORMAL HIGH (ref 38.4–76.8)
Platelets: 274 10*3/uL (ref 145–400)

## 2011-10-25 LAB — CMP (CANCER CENTER ONLY)
AST: 25 U/L (ref 11–38)
Albumin: 3.3 g/dL (ref 3.3–5.5)
Alkaline Phosphatase: 106 U/L — ABNORMAL HIGH (ref 26–84)
BUN, Bld: 15 mg/dL (ref 7–22)
Calcium: 9.6 mg/dL (ref 8.0–10.3)
Creat: 0.8 mg/dl (ref 0.6–1.2)
Glucose, Bld: 167 mg/dL — ABNORMAL HIGH (ref 73–118)

## 2011-10-25 MED ORDER — IOHEXOL 300 MG/ML  SOLN
100.0000 mL | Freq: Once | INTRAMUSCULAR | Status: AC | PRN
  Administered 2011-10-25: 100 mL via INTRAVENOUS

## 2011-10-25 NOTE — Telephone Encounter (Signed)
Pt called stating that under her right armpit where she had had radiation it is very sore.  It is superficial like her muscle is sore.  She wanted to know whether the CT she is having today will be able to see if anything is going on.  Saw pt in lobby, informed pt that the CT chest will show if there are any problems there.  Also encouraged her to contact radiation to see if they have any suggestions regarding pain at the radiation site.  She verbalized understanding.  SLJ

## 2011-10-26 ENCOUNTER — Encounter: Payer: Self-pay | Admitting: Emergency Medicine

## 2011-10-26 ENCOUNTER — Ambulatory Visit (INDEPENDENT_AMBULATORY_CARE_PROVIDER_SITE_OTHER): Payer: Medicare Other | Admitting: Emergency Medicine

## 2011-10-26 ENCOUNTER — Encounter: Payer: Self-pay | Admitting: *Deleted

## 2011-10-26 VITALS — BP 130/62 | HR 107 | Temp 98.3°F | Ht 66.0 in | Wt 165.0 lb

## 2011-10-26 DIAGNOSIS — J449 Chronic obstructive pulmonary disease, unspecified: Secondary | ICD-10-CM | POA: Diagnosis not present

## 2011-10-26 DIAGNOSIS — C3490 Malignant neoplasm of unspecified part of unspecified bronchus or lung: Secondary | ICD-10-CM | POA: Diagnosis not present

## 2011-10-26 NOTE — Assessment & Plan Note (Signed)
Volume loss and scar on CT scan from yesterday, but I dont see any evidence of recurrence. She is following w Dr Arbutus Ped this week

## 2011-10-26 NOTE — Patient Instructions (Addendum)
Please continue your combivent 2 puffs 4 times a day Wear your oxygen at all times Follow with Dr Arbutus Ped as planned Follow with Dr Delton Coombes in 3 months or sooner if you have any problems.

## 2011-10-26 NOTE — Assessment & Plan Note (Signed)
Continue combivent qid for now, consider changing to longer acting meds in future visit

## 2011-10-26 NOTE — Progress Notes (Signed)
Patient called requesting another wig that is more lightweight to wear during the summer months.  [name] qualifies for another wig from the [employer]. CSW informed patient, however, she states there was minimal variety.  CSW recommended patient visit the [facility] office for additional free wigs and scarves. CSW also informed patient of Look Good, Feel Better program. Patient plans to contact CSW with additional questions or concerns.  [name], MSW, [facility] Clinical Social Worker [facility] [phone]

## 2011-10-26 NOTE — Progress Notes (Signed)
Subjective:    Patient ID: [name], female    DOB: Jun 06, 1945, 66 y.o.   MRN: [id number]  HPI History of Present Illness:  [name] is a 67 y.o. female former smoker with GOLD 2 COPD and prior hx of NSCLC and new dx of NSCLC December 2012. Has received XRT and chemo with Drs Michell Heinrich and Arbutus Ped.  Has been followed by Dr Craige Cotta and now wants to change MD's.   She tolerated XRT completely. She has completed 3 cycles chemo.   Currently managed on combivent qid, started O2 4 months ago    Review of Systems  Constitutional: Positive for chills, diaphoresis and appetite change. Negative for fever, activity change, fatigue and unexpected weight change.  HENT: Positive for hearing loss, ear pain, nosebleeds, congestion, trouble swallowing, postnasal drip and sinus pressure. Negative for sore throat, facial swelling, rhinorrhea, sneezing, mouth sores, neck pain, neck stiffness, dental problem, voice change, tinnitus and ear discharge.   Eyes: Positive for discharge. Negative for photophobia, itching and visual disturbance.  Respiratory: Positive for choking, chest tightness, shortness of breath and wheezing. Negative for cough.   Cardiovascular: Positive for chest pain and leg swelling. Negative for palpitations.  Gastrointestinal: Positive for constipation. Negative for nausea, vomiting, abdominal pain, blood in stool and abdominal distention.  Genitourinary: Positive for frequency. Negative for dysuria, urgency, hematuria, decreased urine volume and difficulty urinating.  Musculoskeletal: Positive for joint swelling and gait problem. Negative for myalgias, back pain and arthralgias.  Skin: Negative for color change, pallor and rash.  Neurological: Positive for dizziness, weakness, light-headedness, numbness and headaches. Negative for tremors, syncope and speech difficulty.  Hematological: Negative for adenopathy. Does not bruise/bleed easily.  Psychiatric/Behavioral: Positive for  disturbed wake/sleep cycle. Negative for confusion and agitation. The patient is nervous/anxious.     Past Medical History  Diagnosis Date  . Polyneuropathy in diabetes   . Restless legs syndrome (RLS)   . Chronic sinusitis   . Hypertension   . Hyperlipidemia   . Anemia, unspecified   . GERD (gastroesophageal reflux disease)   . Respiratory failure with hypoxia 01/02/2010  . Complication of anesthesia     slow to wake up  . Asthma   . Coronary artery disease     Dr. Excell Seltzer had stress test done  . Type II or unspecified type diabetes mellitus without mention of complication, not stated as uncontrolled   . Peripheral vascular disease   . PAD (peripheral artery disease)   . Heart murmur   . Dysrhythmia     irregular  . Anginal pain   . History of blood clots     "2 in my aorta"  . Hard of hearing, right   . History of bronchitis   . Bronchial pneumonia     history  . COPD (chronic obstructive pulmonary disease)   . Emphysema   . Shortness of breath     "anytime really"  . Syncope and collapse   . Adenocarcinoma, lung     upper left lobe  . 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 Chemotherapy adverse reaction 09/15/11    "makes me light headed and pass out; this is 3rd blood transfusion since going thru it"  . History of blood transfusion   . Kidney infection     "related to chemo"  . Sinus headache     "all the time"  . Arthritis     "all over my body"  .  Fibromyalgia   . Osteoporosis   . DDD (degenerative disc disease), lumbar   . Scoliosis   . Anxiety   . Mental disorder   . Reflux   . CHF (congestive heart failure)   . DVT (deep venous thrombosis)      Family History  Problem Relation Age of Onset  . Bone cancer Father   . Cancer Father   . Diabetes Mother   . Deep vein thrombosis Mother   . Heart disease Mother     Heart Disease before age 6  . Hyperlipidemia Mother   . Hypertension Mother      History    Social History  . Marital Status: Divorced    Spouse Name: N/A    Number of Children: 3  . Years of Education: N/A   Occupational History  . disabled   . RETIRED    Social History Main Topics  . Smoking status: Former Smoker -- 1.5 packs/day for 40 years    Types: Cigarettes    Quit date: 02/10/1997  . Smokeless tobacco: Never Used  . Alcohol Use: Yes     in the past -occassionaly "maybe on weekends"  . Drug Use: Yes    Special: Hydrocodone  . Sexually Active: No   Other Topics Concern  . Not on file   Social History Narrative  . No narrative on file     Allergies  Allergen Reactions  . Iohexol Shortness Of Breath and Other (See Comments)    "burns me up inside"  . Other     CAN'T STAND THE SMELL OF PURE BLEACH; "HAVE TO BACK UP AND POUR AND DON'T BREATH IT TIL i GET CAP BACK ON; DILUTE IT TO SPRAY"  . Penicillins Itching and Other (See Comments)    "scratchy; makes red marks on me, might be from my scratching"  . Codeine Itching and Nausea And Vomiting    REACTION: GI upset     Outpatient Prescriptions Prior to Visit  Medication Sig Dispense Refill  . albuterol (PROVENTIL) (2.5 MG/3ML) 0.083% nebulizer solution Take 3 mLs (2.5 mg total) by nebulization every 6 (six) hours as needed for wheezing or shortness of breath.      Marland Kitchen albuterol-ipratropium (COMBIVENT) 18-103 MCG/ACT inhaler Inhale 1 puff into the lungs every 6 (six) hours as needed. For shortness of breath  2 Inhaler  2  . ALPRAZolam (XANAX) 1 MG tablet Take 1 mg by mouth 3 (three) times daily as needed. For anxiety      . Alum & Mag Hydroxide-Simeth (MAGIC MOUTHWASH W/LIDOCAINE) SOLN Take 5 mLs by mouth 4 (four) times daily as needed.  200 mL  0  . aspirin EC 325 MG tablet Hold for 1 week then resume taking one tablet by mouth daily      . citalopram (CELEXA) 40 MG tablet Take 0.5 tablets (20 mg total) by mouth daily.      Marland Kitchen diltiazem (CARDIZEM CD) 240 MG 24 hr capsule Take 1 capsule (240 mg total) by  mouth daily.  30 capsule  6  . diphenhydrAMINE (BENADRYL) 50 MG tablet Take 1 tablet (50 mg total) by mouth once.  30 tablet  0  . HYDROcodone-acetaminophen (VICODIN ES) 7.5-750 MG per tablet Take 1 tablet by mouth every 6 (six) hours as needed. For severe pain      . loratadine (CLARITIN) 10 MG tablet Take 1 tablet (10 mg total) by mouth daily.  30 tablet  3  . pantoprazole (PROTONIX) 40 MG  tablet Take 1 tablet (40 mg total) by mouth daily.  30 tablet  1  . potassium chloride SA (K-DUR,KLOR-CON) 20 MEQ tablet Take 1 tablet (20 mEq total) by mouth 2 (two) times daily.  60 tablet  6  . senna (SENOKOT) 8.6 MG TABS Take 1 tablet by mouth daily as needed. For constipation      . sucralfate (CARAFATE) 1 G tablet Take 1 tablet (1 g total) by mouth 4 (four) times daily. Dissolve in 15 ml water.  90 tablet  1  . traMADol (ULTRAM) 50 MG tablet Take 50 mg by mouth every 4 (four) hours as needed. For moderate pain      . triamcinolone ointment (KENALOG) 0.1 % Apply topically 2 (two) times daily.      Marland Kitchen triamterene-hydrochlorothiazide (MAXZIDE-25) 37.5-25 MG per tablet Take 0.5 each (0.5 tablets total) by mouth daily.  30 tablet  6  . dexamethasone (DECADRON) 4 MG tablet Take 4 mg by mouth 2 (two) times daily with a meal. Take 1 tablet twice a day the day before, the day of and the day after chemotherapy.       Marland Kitchen gemfibrozil (LOPID) 600 MG tablet Take 600 mg by mouth daily.       . polyethylene glycol (MIRALAX / GLYCOLAX) packet Take 17 g by mouth daily as needed. For constipation      . prochlorperazine (COMPAZINE) 10 MG tablet Take 10 mg by mouth every 6 (six) hours as needed. For nausea      . predniSONE (DELTASONE) 50 MG tablet Take 1 tablet 13 hours before Ct scan, take 1 tablet 7 hours before ct scan and take 1 tablet 1 hour before CT scan.  3 tablet  0   Facility-Administered Medications Prior to Visit  Medication Dose Route Frequency Provider Last Rate Last Dose  . hyaluronate sodium (RADIAPLEXRX) gel    Topical Once Lurline Hare, MD      . topical emolient (BIAFINE) emulsion   Topical PRN Lurline Hare, MD            Objective:   Physical Exam  Gen: Pleasant, elderly woman, in no distress,  normal affect  ENT: No lesions,  mouth clear,  oropharynx clear, no postnasal drip  Neck: No JVD, no TMG, no carotid bruits  Lungs: No use of accessory muscles, distant, no wheezes or crackles  Cardiovascular: RRR, heart sounds normal, no murmur or gallops, no peripheral edema  Musculoskeletal: No deformities, no cyanosis or clubbing  Neuro: alert, non focal  Skin: Warm, no lesions or rashes    CT scan 10/25/11 --  Comparison: Chest CT 09/16/2011.  Findings:  Mediastinum: Heart size is normal. Small amount of pericardial  fluid and/or thickening anteriorly, unlikely to be of hemodynamic  significance. No pathologically enlarged mediastinal or hilar lymph  nodes. There is atherosclerosis of the thoracic aorta, the great  vessels of the mediastinum and the coronary arteries, including  calcified atherosclerotic plaque in the left anterior descending  and left circumflex coronary arteries. Esophagus is unremarkable in  appearance.  Lungs/Pleura: Status post left upper lobectomy. Post procedural  changes of radiation therapy are noted in the thorax, predominately  in the right upper lobe where there is extensive volume loss and  evolving postradiation changes. The previously noted right upper  lobe mass is now obscured by these changes. Because of the volume  loss, the right middle lobe is hyperexpanded and extends into the  apex of the right hemithorax. The previously noted  ground-glass  attenuation lesion in the right middle lobe is no longer  confidently identified upon this background of postradiation  changes. Small right-sided pleural effusion layering dependently  is simple in appearance. An area of chronic consolidation in the  superior segment of the left lower lobe is  similar to the prior  examination, and also likely represent an evolving postradiation  change. A background of mild centrilobular emphysema and mild  diffuse bronchial wall thickening. No new suspicious appearing  pulmonary nodules or masses are identified.  Upper Abdomen: Small low attenuation left adrenal nodule  (previously characterized as an adenoma) is unchanged.  Musculoskeletal: There are no aggressive appearing lytic or blastic  lesions noted in the visualized portions of the skeleton.  IMPRESSION:  1. Evolving postradiation changes in the lungs bilaterally, as  above, without definitive evidence to suggest local recurrence or  new metastatic disease in the thorax.  2. Status post left upper lobectomy.  3. Atherosclerosis, including left anterior descending coronary  artery disease.  4. Small right-sided pleural effusion layering dependently is  unchanged in size and simple in appearance.      Assessment & Plan:  Non-small cell lung cancer Volume loss and scar on CT scan from yesterday, but I dont see any evidence of recurrence. She is following w Dr Arbutus Ped this week  COPD Continue combivent qid for now, consider changing to longer acting meds in future visit

## 2011-10-28 ENCOUNTER — Encounter: Payer: Self-pay | Admitting: Radiation Oncology

## 2011-10-28 ENCOUNTER — Telehealth: Payer: Self-pay | Admitting: Internal Medicine

## 2011-10-28 ENCOUNTER — Ambulatory Visit (HOSPITAL_BASED_OUTPATIENT_CLINIC_OR_DEPARTMENT_OTHER): Payer: Medicare Other | Admitting: Internal Medicine

## 2011-10-28 ENCOUNTER — Ambulatory Visit
Admission: RE | Admit: 2011-10-28 | Discharge: 2011-10-28 | Disposition: A | Discharge status: Home / Self Care | Payer: Medicare Other | Source: Ambulatory Visit | Attending: Radiation Oncology | Admitting: Radiation Oncology

## 2011-10-28 VITALS — BP 127/64 | HR 120 | Temp 96.9°F | Ht 66.0 in | Wt 163.1 lb

## 2011-10-28 VITALS — BP 104/72 | HR 104 | Temp 97.6°F | Resp 20 | Wt 163.7 lb

## 2011-10-28 DIAGNOSIS — R0602 Shortness of breath: Secondary | ICD-10-CM

## 2011-10-28 DIAGNOSIS — C3490 Malignant neoplasm of unspecified part of unspecified bronchus or lung: Secondary | ICD-10-CM

## 2011-10-28 DIAGNOSIS — C3410 Malignant neoplasm of upper lobe, unspecified bronchus or lung: Secondary | ICD-10-CM

## 2011-10-28 MED ORDER — INTEGRA PLUS PO CAPS: 1.0000 | ORAL_CAPSULE | Freq: Every morning | ORAL | Status: DC

## 2011-10-28 NOTE — Progress Notes (Signed)
[facility] [phone]   [phone]  OFFICE PROGRESS NOTE  [name]., MD [address] [employer] [place] [address]  DIAGNOSIS: Stage IIB/IIIA non-small cell lung cancer consistent with adenocarcinoma diagnosed in December 2012.   PRIOR THERAPY:  1) Status post radiation therapy to the right upper lobe lung mass completed on 06/04/2011 under the care of Dr. Michell Heinrich. 2) Systemic chemotherapy with carboplatin for AUC of 5 and Alimta 500 mg/M2 giving every 3 weeks, status post 3 cycles, last dose was given on 07/21/2011.  CURRENT THERAPY: Observation.  INTERVAL HISTORY: [name] 66 y.o. female returns to the clinic today for routine two-month followup visit accompanied by her daughter. Patient is doing fine today with no specific complaints except for fatigue and shortness breath with exertion. She still home oxygen 2 L per minute. She denied having any significant chest pain, cough or hemoptysis. She has repeat CT scan of the chest performed recently and she is here today for evaluation and discussion of her scan results.  MEDICAL HISTORY: Past Medical History  Diagnosis Date  . Polyneuropathy in diabetes   . Restless legs syndrome (RLS)   . Chronic sinusitis   . Hypertension   . Hyperlipidemia   . Anemia, unspecified   . GERD (gastroesophageal reflux disease)   . Respiratory failure with hypoxia 01/02/2010  . Complication of anesthesia     slow to wake up  . Asthma   . Coronary artery disease     Dr. Excell Seltzer had stress test done  . Type II or unspecified type diabetes mellitus without mention of complication, not stated as uncontrolled   . Peripheral vascular disease   . PAD (peripheral artery disease)   . Heart murmur   . Dysrhythmia     irregular  . Anginal pain   . History of blood clots     "2 in my aorta"  . Hard of hearing, right   . History of bronchitis   . Bronchial pneumonia    history  . COPD (chronic obstructive pulmonary disease)   . Emphysema   . Shortness of breath     "anytime really"  . Syncope and collapse   . Adenocarcinoma, lung     upper left lobe  . 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 Chemotherapy adverse reaction 09/15/11    "makes me light headed and pass out; this is 3rd blood transfusion since going thru it"  . History of blood transfusion   . Kidney infection     "related to chemo"  . Sinus headache     "all the time"  . Arthritis     "all over my body"  . Fibromyalgia   . Osteoporosis   . DDD (degenerative disc disease), lumbar   . Scoliosis   . Anxiety   . Mental disorder   . Reflux   . CHF (congestive heart failure)   . DVT (deep venous thrombosis)     ALLERGIES:  is allergic to iohexol; other; penicillins; and codeine.  MEDICATIONS:  Current Outpatient Prescriptions  Medication Sig Dispense Refill  . albuterol (PROVENTIL) (2.5 MG/3ML) 0.083% nebulizer solution Take 3 mLs (2.5 mg total) by nebulization every 6 (six) hours as needed for wheezing or shortness of breath.      Marland Kitchen albuterol-ipratropium (COMBIVENT) 18-103 MCG/ACT inhaler Inhale 1 puff into the lungs every 6 (six) hours as needed. For shortness  of breath  2 Inhaler  2  . ALPRAZolam (XANAX) 1 MG tablet Take 1 mg by mouth 3 (three) times daily as needed. For anxiety      . Alum & Mag Hydroxide-Simeth (MAGIC MOUTHWASH W/LIDOCAINE) SOLN Take 5 mLs by mouth 4 (four) times daily as needed.  200 mL  0  . aspirin EC 325 MG tablet Hold for 1 week then resume taking one tablet by mouth daily      . citalopram (CELEXA) 40 MG tablet Take 0.5 tablets (20 mg total) by mouth daily.      Marland Kitchen dexamethasone (DECADRON) 4 MG tablet Take 4 mg by mouth 2 (two) times daily with a meal. Take 1 tablet twice a day the day before, the day of and the day after chemotherapy.       . diltiazem (CARDIZEM CD) 240 MG 24 hr capsule Take 1 capsule (240 mg total) by  mouth daily.  30 capsule  6  . diphenhydrAMINE (BENADRYL) 50 MG tablet Take 1 tablet (50 mg total) by mouth once.  30 tablet  0  . gemfibrozil (LOPID) 600 MG tablet Take 600 mg by mouth daily.       Marland Kitchen HYDROcodone-acetaminophen (VICODIN ES) 7.5-750 MG per tablet Take 1 tablet by mouth every 6 (six) hours as needed. For severe pain      . loratadine (CLARITIN) 10 MG tablet Take 1 tablet (10 mg total) by mouth daily.  30 tablet  3  . metFORMIN (GLUCOPHAGE) 500 MG tablet Take 500 mg by mouth 2 (two) times daily with a meal.      . pantoprazole (PROTONIX) 40 MG tablet Take 1 tablet (40 mg total) by mouth daily.  30 tablet  1  . polyethylene glycol (MIRALAX / GLYCOLAX) packet Take 17 g by mouth daily as needed. For constipation      . potassium chloride SA (K-DUR,KLOR-CON) 20 MEQ tablet Take 1 tablet (20 mEq total) by mouth 2 (two) times daily.  60 tablet  6  . prochlorperazine (COMPAZINE) 10 MG tablet Take 10 mg by mouth every 6 (six) hours as needed. For nausea      . senna (SENOKOT) 8.6 MG TABS Take 1 tablet by mouth daily as needed. For constipation      . sucralfate (CARAFATE) 1 G tablet Take 1 tablet (1 g total) by mouth 4 (four) times daily. Dissolve in 15 ml water.  90 tablet  1  . traMADol (ULTRAM) 50 MG tablet Take 50 mg by mouth every 4 (four) hours as needed. For moderate pain      . triamcinolone ointment (KENALOG) 0.1 % Apply topically 2 (two) times daily.      Marland Kitchen triamterene-hydrochlorothiazide (MAXZIDE-25) 37.5-25 MG per tablet Take 0.5 each (0.5 tablets total) by mouth daily.  30 tablet  6  . DISCONTD: calcium carbonate 200 MG capsule Take 250 mg by mouth daily. Does not take everyday       No current facility-administered medications for this visit.   Facility-Administered Medications Ordered in Other Visits  Medication Dose Route Frequency Provider Last Rate Last Dose  . hyaluronate sodium (RADIAPLEXRX) gel   Topical Once Lurline Hare, MD      . topical emolient (BIAFINE)  emulsion   Topical PRN Lurline Hare, MD        SURGICAL HISTORY:  Past Surgical History  Procedure Date  . Lung lobectomy 1986    left  . Septoplasty   . Total hip arthroplasty 01/2010  left  . Bronchoscopy 02/2011  . Dilation and curettage of uterus 1980's  . Tubal ligation 1980's  . Cardiac catheterization   . Fetal blood transfusion 09/16/11  . Joint replacement 04/13/10    Left Hip    REVIEW OF SYSTEMS:  A comprehensive review of systems was negative except for: Constitutional: positive for fatigue Respiratory: positive for dyspnea on exertion   PHYSICAL EXAMINATION: General appearance: alert, cooperative, fatigued and no distress Neck: no adenopathy Lymph nodes: Cervical, supraclavicular, and axillary nodes normal. Resp: wheezes bilaterally Cardio: regular rate and rhythm, S1, S2 normal, no murmur, click, rub or gallop GI: soft, non-tender; bowel sounds normal; no masses,  no organomegaly Extremities: extremities normal, atraumatic, no cyanosis or edema Neurologic: Alert and oriented X 3, normal strength and tone. Normal symmetric reflexes. Normal coordination and gait  ECOG PERFORMANCE STATUS: 1 - Symptomatic but completely ambulatory  Blood pressure 127/64, pulse 120, temperature 96.9 F (36.1 C), temperature source Oral, height 5\' 6"  (1.676 m), weight 163 lb 1.6 oz (73.982 kg).  LABORATORY DATA: Lab Results  Component Value Date   WBC 5.5 10/25/2011   HGB 9.3* 10/25/2011   HCT 27.9* 10/25/2011   MCV 97.4 10/25/2011   PLT 274 10/25/2011      Chemistry      Component Value Date/Time   NA 142 10/25/2011 1200   NA 134* 09/16/2011 0931   K 4.6 10/25/2011 1200   K 3.4* 09/16/2011 0931   CL 97* 10/25/2011 1200   CL 94* 09/16/2011 0931   CO2 24 10/25/2011 1200   CO2 25 09/16/2011 0931   BUN 15 10/25/2011 1200   BUN 18 09/16/2011 0931   CREATININE 0.8 10/25/2011 1200   CREATININE 1.01 09/16/2011 0931      Component Value Date/Time   CALCIUM 9.6 10/25/2011 1200   CALCIUM 9.8  09/16/2011 0931   ALKPHOS 106* 10/25/2011 1200   ALKPHOS 64 08/15/2011 0512   AST 25 10/25/2011 1200   AST 18 08/15/2011 0512   ALT 13 08/15/2011 0512   BILITOT 0.60 10/25/2011 1200   BILITOT 0.3 08/15/2011 0512       RADIOGRAPHIC STUDIES: Ct Chest W Contrast  10/25/2011  *RADIOLOGY REPORT*  Clinical Data: History of lung cancer.  Restaging scan.  CT CHEST WITH CONTRAST  Technique:  Multidetector CT imaging of the chest was performed following the standard protocol during bolus administration of intravenous contrast.  Contrast: OMNIPAQUE IOHEXOL 300 MG/ML  SOLN  Comparison: Chest CT 09/16/2011.  Findings:  Mediastinum: Heart size is normal. Small amount of pericardial fluid and/or thickening anteriorly, unlikely to be of hemodynamic significance. No pathologically enlarged mediastinal or hilar lymph nodes. There is atherosclerosis of the thoracic aorta, the great vessels of the mediastinum and the coronary arteries, including calcified atherosclerotic plaque in the left anterior descending and left circumflex coronary arteries. Esophagus is unremarkable in appearance.  Lungs/Pleura: Status post left upper lobectomy.  Post procedural changes of radiation therapy are noted in the thorax, predominately in the right upper lobe where there is extensive volume loss and evolving postradiation changes.  The previously noted right upper lobe mass is now obscured by these changes.  Because of the volume loss, the right middle lobe is hyperexpanded and extends into the apex of the right hemithorax.  The previously noted ground-glass attenuation lesion in the right middle lobe is no longer confidently identified upon this background of postradiation changes.  Small right-sided pleural effusion layering dependently is simple in appearance.  An area  of chronic consolidation in the superior segment of the left lower lobe is similar to the prior examination, and also likely represent an evolving postradiation change.  A  background of mild centrilobular emphysema and mild diffuse bronchial wall thickening.  No new suspicious appearing pulmonary nodules or masses are identified.  Upper Abdomen: Small low attenuation left adrenal nodule (previously characterized as an adenoma) is unchanged.  Musculoskeletal: There are no aggressive appearing lytic or blastic lesions noted in the visualized portions of the skeleton.  IMPRESSION: 1.  Evolving postradiation changes in the lungs bilaterally, as above, without definitive evidence to suggest local recurrence or new metastatic disease in the thorax. 2.  Status post left upper lobectomy. 3. Atherosclerosis, including left anterior descending coronary artery disease.  4.  Small right-sided pleural effusion layering dependently is unchanged in size and simple in appearance.  Original Report Authenticated By: Florencia Reasons, M.D.    ASSESSMENT: This is a very pleasant 66 years old white female with history of stage IIIa non-small cell lung cancer status post curative radiotherapy to the right upper lobe lung mass followed by 3 cycles of systemic chemotherapy with carboplatin and Alimta and she has been observation for the last 2 months. The patient is doing fine and she has no evidence for disease progression.  PLAN: I discussed the scan results with the patient and her daughter and recommended for her to continue on observation for now. She would have repeat CT scan of the chest with contrast in 3 months. The patient would come back for followup visit at that time.  She was advised to call me immediately she has any concerning symptoms in the interval.  All questions were answered. The patient knows to call the clinic with any problems, questions or concerns. We can certainly see the patient much sooner if necessary.

## 2011-10-28 NOTE — Telephone Encounter (Signed)
appts made and printed for pt aom °

## 2011-10-28 NOTE — Progress Notes (Signed)
Here for fu of right lung.  C/o sharpe pain under arm on right and it radiates around back to left side of under arm.  Skin looks great.  Nothing visible in this area to me.  Lung doctor, Dr. Delton Coombes, started her on continusous o2 at 2l/min to 2.5 l.  Has a new primary doctor.  Name is M. Pandy , she is located at [facility]

## 2011-10-28 NOTE — Progress Notes (Signed)
Department of Radiation Oncology  Phone:  [phone] Fax:        [phone]   Name: [name]   DOB: Jun 17, 1945  MRN: [id number]    Date: 10/28/2011  Follow Up Visit Note  Diagnosis: T1N0 NSCLC of right upper lobe  Interval since last radiation: 5 months  Interval History: [name] presents today for routine followup.  She completed radiation to the right upper lobe in February. She completed 3 cycles of chemotherapy which was complicated by neutropenia and anemia. She actually missed all of her followup appointments with me due to hospitalizations. She had a CT of the chest on Monday which showed progressive radiation change and scarring in the right upper lobe. The right middle lobe nodule was not as prominent and the left nodule was stable. Her shortness of breath continues. She's on 2-2.5 L of oxygen every day and continuously. She saw Dr. Delton Coombes on Monday who switched up some of her inhalers. She has received 2 blood transfusions in the interim. She is seeing Dr. Arbutus Ped following this appointment. She has a new primary care physician and is happy with that. We have a good result from her CT of the chest which was performed on July 15. This showed just radiation change in the posterior aspect of the right upper lobe. The right middle lobe lesion was gone and the left lung lesion was stable. She complains of fatigue and stable shortness breath with exertion  Allergies:  Allergies  Allergen Reactions  . Iohexol Shortness Of Breath and Other (See Comments)    "burns me up inside"  . Other     CAN'T STAND THE SMELL OF PURE BLEACH; "HAVE TO BACK UP AND POUR AND DON'T BREATH IT TIL i GET CAP BACK ON; DILUTE IT TO SPRAY"  . Penicillins Itching and Other (See Comments)    "scratchy; makes red marks on me, might be from my scratching"  . Codeine Itching and Nausea And Vomiting    REACTION: GI upset    Medications:  Current Outpatient Prescriptions  Medication Sig Dispense  Refill  . albuterol (PROVENTIL) (2.5 MG/3ML) 0.083% nebulizer solution Take 3 mLs (2.5 mg total) by nebulization every 6 (six) hours as needed for wheezing or shortness of breath.      Marland Kitchen albuterol-ipratropium (COMBIVENT) 18-103 MCG/ACT inhaler Inhale 1 puff into the lungs every 6 (six) hours as needed. For shortness of breath  2 Inhaler  2  . ALPRAZolam (XANAX) 1 MG tablet Take 1 mg by mouth 3 (three) times daily as needed. For anxiety      . Alum & Mag Hydroxide-Simeth (MAGIC MOUTHWASH W/LIDOCAINE) SOLN Take 5 mLs by mouth 4 (four) times daily as needed.  200 mL  0  . aspirin EC 325 MG tablet Hold for 1 week then resume taking one tablet by mouth daily      . citalopram (CELEXA) 40 MG tablet Take 0.5 tablets (20 mg total) by mouth daily.      Marland Kitchen dexamethasone (DECADRON) 4 MG tablet Take 4 mg by mouth 2 (two) times daily with a meal. Take 1 tablet twice a day the day before, the day of and the day after chemotherapy.       . diltiazem (CARDIZEM CD) 240 MG 24 hr capsule Take 1 capsule (240 mg total) by mouth daily.  30 capsule  6  . diphenhydrAMINE (BENADRYL) 50 MG tablet Take 1 tablet (50 mg total) by mouth once.  30 tablet  0  .  gemfibrozil (LOPID) 600 MG tablet Take 600 mg by mouth daily.       Marland Kitchen HYDROcodone-acetaminophen (VICODIN ES) 7.5-750 MG per tablet Take 1 tablet by mouth every 6 (six) hours as needed. For severe pain      . loratadine (CLARITIN) 10 MG tablet Take 1 tablet (10 mg total) by mouth daily.  30 tablet  3  . metFORMIN (GLUCOPHAGE) 500 MG tablet Take 500 mg by mouth 2 (two) times daily with a meal.      . pantoprazole (PROTONIX) 40 MG tablet Take 1 tablet (40 mg total) by mouth daily.  30 tablet  1  . polyethylene glycol (MIRALAX / GLYCOLAX) packet Take 17 g by mouth daily as needed. For constipation      . potassium chloride SA (K-DUR,KLOR-CON) 20 MEQ tablet Take 1 tablet (20 mEq total) by mouth 2 (two) times daily.  60 tablet  6  . prochlorperazine (COMPAZINE) 10 MG tablet Take  10 mg by mouth every 6 (six) hours as needed. For nausea      . senna (SENOKOT) 8.6 MG TABS Take 1 tablet by mouth daily as needed. For constipation      . sucralfate (CARAFATE) 1 G tablet Take 1 tablet (1 g total) by mouth 4 (four) times daily. Dissolve in 15 ml water.  90 tablet  1  . traMADol (ULTRAM) 50 MG tablet Take 50 mg by mouth every 4 (four) hours as needed. For moderate pain      . triamcinolone ointment (KENALOG) 0.1 % Apply topically 2 (two) times daily.      Marland Kitchen triamterene-hydrochlorothiazide (MAXZIDE-25) 37.5-25 MG per tablet Take 0.5 each (0.5 tablets total) by mouth daily.  30 tablet  6  . DISCONTD: calcium carbonate 200 MG capsule Take 250 mg by mouth daily. Does not take everyday       No current facility-administered medications for this encounter.   Facility-Administered Medications Ordered in Other Encounters  Medication Dose Route Frequency Provider Last Rate Last Dose  . hyaluronate sodium (RADIAPLEXRX) gel   Topical Once Lurline Hare, MD      . topical emolient (BIAFINE) emulsion   Topical PRN Lurline Hare, MD        Physical Exam:   weight is 163 lb 11.2 oz (74.254 kg). Her oral temperature is 97.6 F (36.4 C). Her blood pressure is 104/72 and her pulse is 104. Her respiration is 20 and oxygen saturation is 98%.  She is in no respiratory distress. She is wearing a nasal cannula.  IMPRESSION: [name] is a 66 y.o. female with no evidence of disease  PLAN:  I think [name] looks good at this point. We'll plan on seeing her back in 6 months with a CT of the chest at that time. She is somewhat concerned Dr. Shirline Frees will recommend further chemotherapy. Given her difficulties with pancytopenia I counseled her that that she could discuss this further with him.   Lurline Hare, MD

## 2011-10-29 ENCOUNTER — Telehealth: Payer: Self-pay | Admitting: Emergency Medicine

## 2011-10-29 DIAGNOSIS — J449 Chronic obstructive pulmonary disease, unspecified: Secondary | ICD-10-CM

## 2011-10-29 NOTE — Telephone Encounter (Signed)
Pt aware that RB back in office no Monday and will advise if okay to place order for this type of O2 tank. Pt aware she will get a call back then.

## 2011-11-01 NOTE — Telephone Encounter (Signed)
Yes, please ask DME to get her the lightest system possible, thanks

## 2011-11-01 NOTE — Telephone Encounter (Signed)
Patient aware order has been 0placed for lighterportable oxygen system through [facility].

## 2011-11-04 ENCOUNTER — Telehealth: Payer: Self-pay | Admitting: Medical Oncology

## 2011-11-04 NOTE — Telephone Encounter (Signed)
Reports vaginal " yeast infection " with increased moisture and itching. I told her to  Call her primary care.

## 2011-11-15 DIAGNOSIS — M47817 Spondylosis without myelopathy or radiculopathy, lumbosacral region: Secondary | ICD-10-CM | POA: Diagnosis not present

## 2011-11-15 DIAGNOSIS — S139XXA Sprain of joints and ligaments of unspecified parts of neck, initial encounter: Secondary | ICD-10-CM | POA: Diagnosis not present

## 2011-11-15 DIAGNOSIS — B0223 Postherpetic polyneuropathy: Secondary | ICD-10-CM | POA: Diagnosis not present

## 2011-11-15 DIAGNOSIS — R079 Chest pain, unspecified: Secondary | ICD-10-CM | POA: Diagnosis not present

## 2011-11-17 DIAGNOSIS — J0100 Acute maxillary sinusitis, unspecified: Secondary | ICD-10-CM | POA: Diagnosis not present

## 2011-11-19 ENCOUNTER — Telehealth: Payer: Self-pay | Admitting: Medical Oncology

## 2011-11-19 ENCOUNTER — Telehealth: Payer: Self-pay | Admitting: Emergency Medicine

## 2011-11-19 NOTE — Telephone Encounter (Signed)
Reviewed her status with her by phone. She is concerned about new worsening back pain, worried also about whether it was ok for her to stop chemo at this time. I have reassured her about the chemo, asked her to talk to Dr Arbutus Ped if she has questions about what circumstance would lead them to restart it. Offered to help work up her back pain if it persists. I don't think it has anything to do with her lung dz or her cancer rx, but we can investigate. She will call if she needs [place].

## 2011-11-19 NOTE — Telephone Encounter (Signed)
Back pain worse -  Not in her chest. This pain is affecting her sleeping. She is afraid the tumor is getting bigger because she has never hurt like this . She has increased wheezing too. " I don't want to be eating pain pills all the time". Next CT /f/u in October. I will consult with Dr Donnald Garre .

## 2011-11-19 NOTE — Telephone Encounter (Signed)
Called and spoke with pt and she stated that Dr. Shirline Frees has stopped her chemo.  She is now c/o lung pain on the right side, right arm is weak and her shoulder feels like she has pulled a muscle but she has not.  She stated that her pain has increased since they have stopped her chemo and she is using the pain meds and a heating pad.  She did see Dr. Haroldine Laws on Wednesday and started on doxy for a sinus infection.  Pt stated that she is using flexeril at bedtime to help her muscles relax.  Pt stated that she would really like to speak with Dr. Magda Paganini just wants to be reassured that they have not given up on her.  RB please advise.  Thanks  Allergies  Allergen Reactions  . Iohexol Shortness Of Breath and Other (See Comments)    "burns me up inside"  . Other     CAN'T STAND THE SMELL OF PURE BLEACH; "HAVE TO BACK UP AND POUR AND DON'T BREATH IT TIL i GET CAP BACK ON; DILUTE IT TO SPRAY"  . Penicillins Itching and Other (See Comments)    "scratchy; makes red marks on me, might be from my scratching"  . Codeine Itching and Nausea And Vomiting    REACTION: GI upset

## 2011-11-22 ENCOUNTER — Telehealth: Payer: Self-pay | Admitting: Medical Oncology

## 2011-11-22 ENCOUNTER — Telehealth: Payer: Self-pay | Admitting: Emergency Medicine

## 2011-11-22 ENCOUNTER — Encounter: Payer: Self-pay | Admitting: Internal Medicine

## 2011-11-22 DIAGNOSIS — J449 Chronic obstructive pulmonary disease, unspecified: Secondary | ICD-10-CM

## 2011-11-22 DIAGNOSIS — M549 Dorsalgia, unspecified: Secondary | ICD-10-CM

## 2011-11-22 DIAGNOSIS — C3490 Malignant neoplasm of unspecified part of unspecified bronchus or lung: Secondary | ICD-10-CM

## 2011-11-22 NOTE — Telephone Encounter (Signed)
Called, spoke with pt.  Informed her of below per Dr. Delton Coombes.  She verbalized understanding of this.  We have scheduled her to see RB on Friday, Aug 16 at 2 pm with cxr prior.  Pt is aware and advised to seek call back if symptoms worsen prior to this or seek emergency care if needed.  She verbalized understanding of instructions and voiced no further questions/concerns at this time.

## 2011-11-22 NOTE — Progress Notes (Signed)
I did return the patient call and she had a question about her bill,so I gave her the number to the billing department.

## 2011-11-22 NOTE — Telephone Encounter (Signed)
I told [name] to f/u with pulmonologist for her back pain and wheezing.

## 2011-11-22 NOTE — Telephone Encounter (Signed)
Called, spoke with pt.  States she called Dr. Ena Dawley office today regarding her back pain and was advised to call RB about it.  States since talking with Dr. Delton Coombes on Friday, she now has the back pain when coughing and sneezing.  Pain is 5-8/10.  States it is on the right side of back near the center and "feels like something is going to bust."  Using heating pad with some ease.  Believes there may be fluid built up -- would like to know if RB will order a fluid pill to see if this helps.  Dr. Delton Coombes, pls advise.  Thank you.  Note:  Pt states she is on doxy by Dr. Esmeralda Arthur for sinus infection.

## 2011-11-22 NOTE — Telephone Encounter (Signed)
She needs to be seen, with a CXR. If no one has openings you can put her into my schedule.

## 2011-11-26 ENCOUNTER — Telehealth: Payer: Self-pay | Admitting: Emergency Medicine

## 2011-11-26 ENCOUNTER — Ambulatory Visit (INDEPENDENT_AMBULATORY_CARE_PROVIDER_SITE_OTHER)
Admission: RE | Admit: 2011-11-26 | Discharge: 2011-11-26 | Disposition: A | Discharge status: Home / Self Care | Payer: Medicare Other | Source: Ambulatory Visit | Attending: Emergency Medicine | Admitting: Emergency Medicine

## 2011-11-26 ENCOUNTER — Encounter: Payer: Self-pay | Admitting: Emergency Medicine

## 2011-11-26 ENCOUNTER — Ambulatory Visit (INDEPENDENT_AMBULATORY_CARE_PROVIDER_SITE_OTHER): Payer: Medicare Other | Admitting: Emergency Medicine

## 2011-11-26 VITALS — BP 112/50 | HR 114 | Temp 97.1°F | Ht 66.0 in | Wt 168.4 lb

## 2011-11-26 DIAGNOSIS — C3490 Malignant neoplasm of unspecified part of unspecified bronchus or lung: Secondary | ICD-10-CM | POA: Diagnosis not present

## 2011-11-26 DIAGNOSIS — M549 Dorsalgia, unspecified: Secondary | ICD-10-CM

## 2011-11-26 DIAGNOSIS — J449 Chronic obstructive pulmonary disease, unspecified: Secondary | ICD-10-CM

## 2011-11-26 DIAGNOSIS — R918 Other nonspecific abnormal finding of lung field: Secondary | ICD-10-CM | POA: Diagnosis not present

## 2011-11-26 DIAGNOSIS — M25519 Pain in unspecified shoulder: Secondary | ICD-10-CM

## 2011-11-26 DIAGNOSIS — R0989 Other specified symptoms and signs involving the circulatory and respiratory systems: Secondary | ICD-10-CM | POA: Diagnosis not present

## 2011-11-26 DIAGNOSIS — R05 Cough: Secondary | ICD-10-CM | POA: Diagnosis not present

## 2011-11-26 MED ORDER — HYDROCOD POLST-CHLORPHEN POLST 10-8 MG/5ML PO LQCR: ORAL | Status: DC

## 2011-11-26 MED ORDER — HYDROCOD POLST-CHLORPHEN POLST 10-8 MG/5ML PO LQCR: 5.0000 mL | Freq: Two times a day (BID) | ORAL | Status: DC | PRN

## 2011-11-26 NOTE — Progress Notes (Signed)
Subjective:    Patient ID: [name], female    DOB: [birth year], 66 y.o.   MRN: [id number] HPI History of Present Illness:  [name] is a 66 y.o. 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 Has received XRT and chemo with Drs Michell Heinrich and Arbutus Ped.  She tolerated XRT completely. She has completed 3 cycles chemo.  Currently managed on combivent qid, started O2.   ROV 11/26/11 -- COPD with adenoCA 12/12 (chemo/XRT).  Presents today reporting R back and shoulder pain - is musculoskeletal, is involving R chest and R shoulder as well. She was exerting herself 3 weeks ago, believes she was sore afterwards and now with muscle pain. Dr Sander Radon gave her flexeril. [facility] Dr Vear Clock. She is having more cough, which is exacerbating her back and shoulder pain.       Objective:   Physical Exam Filed Vitals:   11/26/11 1423  BP: 112/50  Pulse: 114  Temp: 97.1 F (36.2 C)   Gen: Pleasant, elderly woman, in no distress,  normal affect  ENT: No lesions,  mouth clear,  oropharynx clear, no postnasal drip  Neck: No JVD, no TMG, no carotid bruits  Lungs: No use of accessory muscles, distant, no wheezes or crackles  Cardiovascular: RRR, heart sounds normal, no murmur or gallops, no peripheral edema  Musculoskeletal: No deformities, no cyanosis or clubbing  Neuro: alert, non focal  Skin: Warm, no lesions or rashes   CT scan 10/25/11 --  Comparison: Chest CT 09/16/2011.  Findings:  Mediastinum: Heart size is normal. Small amount of pericardial  fluid and/or thickening anteriorly, unlikely to be of hemodynamic  significance. No pathologically enlarged mediastinal or hilar lymph  nodes. There is atherosclerosis of the thoracic aorta, the great  vessels of the mediastinum and the coronary arteries, including  calcified atherosclerotic plaque in the left anterior descending  and left circumflex coronary arteries. Esophagus is unremarkable  in  appearance.  Lungs/Pleura: Status post left upper lobectomy. Post procedural  changes of radiation therapy are noted in the thorax, predominately  in the right upper lobe where there is extensive volume loss and  evolving postradiation changes. The previously noted right upper  lobe mass is now obscured by these changes. Because of the volume  loss, the right middle lobe is hyperexpanded and extends into the  apex of the right hemithorax. The previously noted ground-glass  attenuation lesion in the right middle lobe is no longer  confidently identified upon this background of postradiation  changes. Small right-sided pleural effusion layering dependently  is simple in appearance. An area of chronic consolidation in the  superior segment of the left lower lobe is similar to the prior  examination, and also likely represent an evolving postradiation  change. A background of mild centrilobular emphysema and mild  diffuse bronchial wall thickening. No new suspicious appearing  pulmonary nodules or masses are identified.  Upper Abdomen: Small low attenuation left adrenal nodule  (previously characterized as an adenoma) is unchanged.  Musculoskeletal: There are no aggressive appearing lytic or blastic  lesions noted in the visualized portions of the skeleton.  IMPRESSION:  1. Evolving postradiation changes in the lungs bilaterally, as  above, without definitive evidence to suggest local recurrence or  new metastatic disease in the thorax.  2. Status post left upper lobectomy.  3. Atherosclerosis, including left anterior descending coronary  artery disease.  4. Small right-sided pleural effusion layering dependently is  unchanged in size and simple in appearance.  CXR 11/26/11 --  IMPRESSION:  Slight interval decrease in the right posterior collapse /  consolidation. The left suprahilar opacity has also decreased in  the interval. No new or progressive findings.  R shoulder X-ray  11/26/11 --  IMPRESSION:  No bony changes in the right shoulder to explain the history of  right shoulder pain      Assessment & Plan:  Back pain Acute on chronic. She is concerned that this relates to her R lung lesion - I tried to reassure her that this is MSK. Certainly her cough could be contributing. I cannot/will not give her narcs since she sees the [facility]. Have asked her to contact Dr Sander Radon to see if she should increase the flexeril  COPD Continue combivent  Chronic cough Trial tussionex

## 2011-11-26 NOTE — Assessment & Plan Note (Signed)
--  Continue combivent.  

## 2011-11-26 NOTE — Telephone Encounter (Signed)
Order placed. Pt is aware.  Sophiya Morello, CMA  

## 2011-11-26 NOTE — Assessment & Plan Note (Signed)
Trial tussionex

## 2011-11-26 NOTE — Patient Instructions (Addendum)
Continue your combivent Your CXR and your R shoulder X-ray are normal We will start Tussionex 1 tbsp every 12 hours if needed for cough Speak to Dr Sander Radon about possibly increasing your Flexeril Review your back symptoms with Dr Vear Clock at the [facility]

## 2011-11-26 NOTE — Telephone Encounter (Signed)
I spoke with the pt and she has an appt today with RB and is already set to have a CXR. Pt states she is having pain in her right shoulder and wants to have an xray of this as well while she is here. I advised the pt that I will ask Dr. Delton Coombes and we will place the order if he wants this done. Please advise. Carron Curie, CMA

## 2011-11-26 NOTE — Assessment & Plan Note (Signed)
Acute on chronic. She is concerned that this relates to her R lung lesion - I tried to reassure her that this is MSK. Certainly her cough could be contributing. I cannot/will not give her narcs since she sees the [facility]. Have asked her to contact Dr Sander Radon to see if she should increase the flexeril

## 2011-11-26 NOTE — Telephone Encounter (Signed)
This is OK, please order it

## 2011-11-29 ENCOUNTER — Telehealth: Payer: Self-pay | Admitting: Medical Oncology

## 2011-11-29 NOTE — Telephone Encounter (Signed)
Pt still having pain-She saw Dr Delton Coombes who recommend she see her pain specialist. I referred her to him also

## 2011-12-01 ENCOUNTER — Telehealth: Payer: Self-pay | Admitting: Emergency Medicine

## 2011-12-01 NOTE — Telephone Encounter (Signed)
Spoke with pt and notified that she can try delsym for cough and let us know if this does not help. She was fine with this and states nothing further needed.

## 2011-12-08 DIAGNOSIS — H9319 Tinnitus, unspecified ear: Secondary | ICD-10-CM | POA: Diagnosis not present

## 2011-12-08 DIAGNOSIS — H905 Unspecified sensorineural hearing loss: Secondary | ICD-10-CM | POA: Diagnosis not present

## 2011-12-08 DIAGNOSIS — H903 Sensorineural hearing loss, bilateral: Secondary | ICD-10-CM | POA: Diagnosis not present

## 2011-12-09 DIAGNOSIS — R42 Dizziness and giddiness: Secondary | ICD-10-CM | POA: Diagnosis not present

## 2011-12-09 DIAGNOSIS — D62 Acute posthemorrhagic anemia: Secondary | ICD-10-CM | POA: Diagnosis not present

## 2011-12-09 DIAGNOSIS — M6281 Muscle weakness (generalized): Secondary | ICD-10-CM | POA: Diagnosis not present

## 2011-12-09 DIAGNOSIS — E876 Hypokalemia: Secondary | ICD-10-CM | POA: Diagnosis not present

## 2011-12-09 DIAGNOSIS — E119 Type 2 diabetes mellitus without complications: Secondary | ICD-10-CM | POA: Diagnosis not present

## 2011-12-09 DIAGNOSIS — F411 Generalized anxiety disorder: Secondary | ICD-10-CM | POA: Diagnosis not present

## 2011-12-09 DIAGNOSIS — I10 Essential (primary) hypertension: Secondary | ICD-10-CM | POA: Diagnosis not present

## 2011-12-09 DIAGNOSIS — F329 Major depressive disorder, single episode, unspecified: Secondary | ICD-10-CM | POA: Diagnosis not present

## 2011-12-21 ENCOUNTER — Telehealth: Payer: Self-pay | Admitting: Medical Oncology

## 2011-12-21 NOTE — Telephone Encounter (Signed)
Persistent pain right side and under right arm radiating toward r breast -arm numb. Concerned there may be a enlarged lymph node. Her other MD tell her it may be muscular. She is very concerned . I will consult with Dr Donnald Garre.

## 2011-12-21 NOTE — Telephone Encounter (Signed)
Per Dr Donnald Garre may move up labs and scans and office visit . She stated she has a small discoloration on her skin, aching and burning.I told her to call pcp as she may have shingles. I also told her The scheduler will call her back with new appointments and to go to ED if pain becomes severe.

## 2011-12-23 ENCOUNTER — Telehealth: Payer: Self-pay | Admitting: *Deleted

## 2011-12-23 NOTE — Telephone Encounter (Signed)
Left voice message to inform the patient of the new date and time of the labs and scans

## 2011-12-28 DIAGNOSIS — E1149 Type 2 diabetes mellitus with other diabetic neurological complication: Secondary | ICD-10-CM | POA: Diagnosis not present

## 2011-12-28 DIAGNOSIS — C3490 Malignant neoplasm of unspecified part of unspecified bronchus or lung: Secondary | ICD-10-CM | POA: Diagnosis not present

## 2011-12-28 DIAGNOSIS — R7989 Other specified abnormal findings of blood chemistry: Secondary | ICD-10-CM | POA: Diagnosis not present

## 2011-12-28 DIAGNOSIS — K130 Diseases of lips: Secondary | ICD-10-CM | POA: Diagnosis not present

## 2011-12-28 DIAGNOSIS — D630 Anemia in neoplastic disease: Secondary | ICD-10-CM | POA: Diagnosis not present

## 2012-01-05 ENCOUNTER — Telehealth: Payer: Self-pay | Admitting: Emergency Medicine

## 2012-01-05 NOTE — Telephone Encounter (Signed)
Pt does not want to schedule an appt for her 3 mo f/u w/ RB at this time.  Pt stated that she will call us when an appt is necessary.  [name]

## 2012-01-05 NOTE — Telephone Encounter (Signed)
Called and spoke with patient. Patient stating she had "all these appointment dates" on paper from our office (AVS from last visit on 11/26/11).  Patient asked if she had any appt scheduled with our office and to clarify her appt with oncology.  I informed patient that according to the office note with Dr. Shirline Frees in July he wanted her to have repeat CT scan done in 3 months which would be October and wanted a folllow up with him after the scan, she stated she is aware of this.  I then informed her that she has an appt with his office 01/20/12 and she said she knew nothing about this. I advised patient to call Dr. Cleora Fleet office to clarify when her appt is and when Dr. Shirline Frees wants her in.  She stated she was calling their office now and needed nothing else from me, said she would call us back once she talked to Oncology.

## 2012-01-10 ENCOUNTER — Telehealth: Payer: Self-pay | Admitting: Medical Oncology

## 2012-01-10 DIAGNOSIS — Z91041 Radiographic dye allergy status: Secondary | ICD-10-CM

## 2012-01-10 MED ORDER — DIPHENHYDRAMINE HCL 50 MG PO TABS: 50.0000 mg | ORAL_TABLET | Freq: Once | ORAL | Status: DC

## 2012-01-10 MED ORDER — PREDNISONE 50 MG PO TABS: ORAL_TABLET | ORAL | Status: DC

## 2012-01-10 NOTE — Telephone Encounter (Signed)
Needs premeds for CT scan- i called in prednisone and benadryl

## 2012-01-13 DIAGNOSIS — R079 Chest pain, unspecified: Secondary | ICD-10-CM | POA: Diagnosis not present

## 2012-01-13 DIAGNOSIS — M47817 Spondylosis without myelopathy or radiculopathy, lumbosacral region: Secondary | ICD-10-CM | POA: Diagnosis not present

## 2012-01-13 DIAGNOSIS — M159 Polyosteoarthritis, unspecified: Secondary | ICD-10-CM | POA: Diagnosis not present

## 2012-01-17 ENCOUNTER — Ambulatory Visit (HOSPITAL_COMMUNITY)
Admission: RE | Admit: 2012-01-17 | Discharge: 2012-01-17 | Disposition: A | Discharge status: Home / Self Care | Payer: Medicare Other | Source: Ambulatory Visit | Attending: Internal Medicine | Admitting: Internal Medicine

## 2012-01-17 ENCOUNTER — Other Ambulatory Visit (HOSPITAL_BASED_OUTPATIENT_CLINIC_OR_DEPARTMENT_OTHER): Payer: Medicare Other | Admitting: Lab

## 2012-01-17 DIAGNOSIS — I2510 Atherosclerotic heart disease of native coronary artery without angina pectoris: Secondary | ICD-10-CM | POA: Insufficient documentation

## 2012-01-17 DIAGNOSIS — C3490 Malignant neoplasm of unspecified part of unspecified bronchus or lung: Secondary | ICD-10-CM

## 2012-01-17 DIAGNOSIS — K8020 Calculus of gallbladder without cholecystitis without obstruction: Secondary | ICD-10-CM | POA: Diagnosis not present

## 2012-01-17 DIAGNOSIS — J90 Pleural effusion, not elsewhere classified: Secondary | ICD-10-CM | POA: Diagnosis not present

## 2012-01-17 LAB — CBC WITH DIFFERENTIAL/PLATELET
BASO%: 0.1 % (ref 0.0–2.0)
Eosinophils Absolute: 0 10*3/uL (ref 0.0–0.5)
HCT: 29.2 % — ABNORMAL LOW (ref 34.8–46.6)
HGB: 10.2 g/dL — ABNORMAL LOW (ref 11.6–15.9)
LYMPH%: 7.2 % — ABNORMAL LOW (ref 14.0–49.7)
MCHC: 35 g/dL (ref 31.5–36.0)
MONO#: 0.1 10*3/uL (ref 0.1–0.9)
NEUT#: 7.7 10*3/uL — ABNORMAL HIGH (ref 1.5–6.5)
NEUT%: 91 % — ABNORMAL HIGH (ref 38.4–76.8)
Platelets: 287 10*3/uL (ref 145–400)
WBC: 8.5 10*3/uL (ref 3.9–10.3)
lymph#: 0.6 10*3/uL — ABNORMAL LOW (ref 0.9–3.3)

## 2012-01-17 LAB — COMPREHENSIVE METABOLIC PANEL (CC13)
ALT: 15 U/L (ref 0–55)
CO2: 21 mEq/L — ABNORMAL LOW (ref 22–29)
Calcium: 10.2 mg/dL (ref 8.4–10.4)
Chloride: 101 mEq/L (ref 98–107)
Creatinine: 1.1 mg/dL (ref 0.6–1.1)
Glucose: 190 mg/dl — ABNORMAL HIGH (ref 70–99)
Total Protein: 7.5 g/dL (ref 6.4–8.3)

## 2012-01-17 MED ORDER — IOHEXOL 300 MG/ML  SOLN
80.0000 mL | Freq: Once | INTRAMUSCULAR | Status: AC | PRN
  Administered 2012-01-17: 80 mL via INTRAVENOUS

## 2012-01-20 ENCOUNTER — Telehealth: Payer: Self-pay | Admitting: Internal Medicine

## 2012-01-20 ENCOUNTER — Ambulatory Visit (HOSPITAL_BASED_OUTPATIENT_CLINIC_OR_DEPARTMENT_OTHER): Payer: Medicare Other | Admitting: Internal Medicine

## 2012-01-20 VITALS — BP 146/67 | HR 120 | Temp 97.0°F | Resp 22 | Ht 66.0 in | Wt 173.6 lb

## 2012-01-20 DIAGNOSIS — C3490 Malignant neoplasm of unspecified part of unspecified bronchus or lung: Secondary | ICD-10-CM

## 2012-01-20 DIAGNOSIS — C3410 Malignant neoplasm of upper lobe, unspecified bronchus or lung: Secondary | ICD-10-CM

## 2012-01-20 NOTE — Progress Notes (Signed)
[facility] [phone]   [phone]  OFFICE PROGRESS NOTE  [name]., MD [address] [employer] [place] [address]  DIAGNOSIS: Stage IIB/IIIA non-small cell lung cancer consistent with adenocarcinoma diagnosed in December 2012.   PRIOR THERAPY:  1) Status post radiation therapy to the right upper lobe lung mass completed on 06/04/2011 under the care of Dr. Michell Heinrich.  2) Systemic chemotherapy with carboplatin for AUC of 5 and Alimta 500 mg/M2 giving every 3 weeks, status post 3 cycles, last dose was given on 07/21/2011.   CURRENT THERAPY: Observation.  INTERVAL HISTORY: [name] 66 y.o. female returns to the clinic today for followup visit accompanied by her daughter. The patient is feeling fine today with no specific complaints except for the baseline shortness breath increased with exertion. She is currently on home oxygen. She denied having any significant chest pain, cough or hemoptysis. The patient denied having any significant weight loss or night sweats. The patient has repeat CT scan of the chest performed recently and she is here for evaluation and discussion of her scan results.  MEDICAL HISTORY: Past Medical History  Diagnosis Date  . Polyneuropathy in diabetes   . Restless legs syndrome (RLS)   . Chronic sinusitis   . Hypertension   . Hyperlipidemia   . Anemia, unspecified   . GERD (gastroesophageal reflux disease)   . Respiratory failure with hypoxia 01/02/2010  . Complication of anesthesia     slow to wake up  . Asthma   . Coronary artery disease     Dr. Excell Seltzer had stress test done  . Type II or unspecified type diabetes mellitus without mention of complication, not stated as uncontrolled   . Peripheral vascular disease   . PAD (peripheral artery disease)   . Heart murmur   . Dysrhythmia     irregular  . Anginal pain   . History of blood clots     "2 in my aorta"  . Hard of hearing,  right   . History of bronchitis   . Bronchial pneumonia     history  . COPD (chronic obstructive pulmonary disease)   . Emphysema   . Shortness of breath     "anytime really"  . Syncope and collapse   . Adenocarcinoma, lung     upper left lobe  . 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 Chemotherapy adverse reaction 09/15/11    "makes me light headed and pass out; this is 3rd blood transfusion since going thru it"  . History of blood transfusion   . Kidney infection     "related to chemo"  . Sinus headache     "all the time"  . Arthritis     "all over my body"  . Fibromyalgia   . Osteoporosis   . DDD (degenerative disc disease), lumbar   . Scoliosis   . Anxiety   . Mental disorder   . Reflux   . CHF (congestive heart failure)   . DVT (deep venous thrombosis)     ALLERGIES:  is allergic to iohexol; other; penicillins; and codeine.  MEDICATIONS:  Current Outpatient Prescriptions  Medication Sig Dispense Refill  . albuterol (PROVENTIL) (2.5 MG/3ML) 0.083% nebulizer solution Take 3 mLs (2.5 mg total) by nebulization every 6 (six) hours as needed for wheezing or shortness of breath.      Marland Kitchen albuterol-ipratropium (COMBIVENT) 18-103 MCG/ACT inhaler Inhale  1 puff into the lungs every 6 (six) hours as needed. For shortness of breath  2 Inhaler  2  . ALPRAZolam (XANAX) 1 MG tablet Take 1 mg by mouth 3 (three) times daily as needed. For anxiety      . Alum & Mag Hydroxide-Simeth (MAGIC MOUTHWASH W/LIDOCAINE) SOLN Take 5 mLs by mouth 4 (four) times daily as needed.  200 mL  0  . aspirin EC 325 MG tablet Hold for 1 week then resume taking one tablet by mouth daily      . chlorpheniramine-HYDROcodone (TUSSIONEX) 10-8 MG/5ML LQCR 1 tbsp every 12 hours if needed for cough  140 mL  0  . citalopram (CELEXA) 40 MG tablet Take 0.5 tablets (20 mg total) by mouth daily.      . cyclobenzaprine (FLEXERIL) 5 MG tablet Take 5 mg by mouth 3 (three) times daily as  needed.      Marland Kitchen dexamethasone (DECADRON) 4 MG tablet Take 4 mg by mouth 2 (two) times daily with a meal. Take 1 tablet twice a day the day before, the day of and the day after chemotherapy.       . diltiazem (CARDIZEM CD) 240 MG 24 hr capsule Take 1 capsule (240 mg total) by mouth daily.  30 capsule  6  . diphenhydrAMINE (BENADRYL) 50 MG tablet Take 1 tablet (50 mg total) by mouth once.  1 tablet  0  . doxycycline (VIBRAMYCIN) 100 MG capsule Take 100 mg by mouth 2 (two) times daily.      . Fe Fum-FePoly-Vit C-Vit B3 (INTEGRA PO) Take 1 tablet by mouth daily.      Marland Kitchen FeFum-FePoly-FA-B Cmp-C-Biot (INTEGRA PLUS) CAPS Take 1 capsule by mouth every morning.  30 capsule  2  . gemfibrozil (LOPID) 600 MG tablet Take 600 mg by mouth daily.       Marland Kitchen HYDROcodone-acetaminophen (VICODIN ES) 7.5-750 MG per tablet Take 1 tablet by mouth every 6 (six) hours as needed. For severe pain      . Lactobacillus (ACIDOPHILUS PO) Take 1 tablet by mouth daily.      Marland Kitchen loratadine (CLARITIN) 10 MG tablet Take 1 tablet (10 mg total) by mouth daily.  30 tablet  3  . metFORMIN (GLUCOPHAGE) 500 MG tablet Take 500 mg by mouth 2 (two) times daily with a meal.      . Multiple Vitamins-Minerals (CENTRUM SILVER PO) Take 1 tablet by mouth daily.      . pantoprazole (PROTONIX) 40 MG tablet Take 1 tablet (40 mg total) by mouth daily.  30 tablet  1  . polyethylene glycol (MIRALAX / GLYCOLAX) packet Take 17 g by mouth daily as needed. For constipation      . potassium chloride SA (K-DUR,KLOR-CON) 20 MEQ tablet Take 1 tablet (20 mEq total) by mouth 2 (two) times daily.  60 tablet  6  . predniSONE (DELTASONE) 50 MG tablet Take 1 tablet 13 hours before Ct scan, take 1 tablet 7 hours before ct scan and take 1 tablet 1 hour before CT scan.  3 tablet  0  . prochlorperazine (COMPAZINE) 10 MG tablet Take 10 mg by mouth every 6 (six) hours as needed. For nausea      . senna (SENOKOT) 8.6 MG TABS Take 1 tablet by mouth daily as needed. For constipation       . sucralfate (CARAFATE) 1 G tablet Take 1 tablet (1 g total) by mouth 4 (four) times daily. Dissolve in 15 ml water.  90 tablet  1  . traMADol (ULTRAM) 50 MG tablet Take 50 mg by mouth every 4 (four) hours as needed. For moderate pain      . triamcinolone ointment (KENALOG) 0.1 % Apply topically 2 (two) times daily.      Marland Kitchen triamterene-hydrochlorothiazide (MAXZIDE-25) 37.5-25 MG per tablet Take 0.5 each (0.5 tablets total) by mouth daily.  30 tablet  6  . DISCONTD: calcium carbonate 200 MG capsule Take 250 mg by mouth daily. Does not take everyday       No current facility-administered medications for this visit.   Facility-Administered Medications Ordered in Other Visits  Medication Dose Route Frequency Provider Last Rate Last Dose  . hyaluronate sodium (RADIAPLEXRX) gel   Topical Once Lurline Hare, MD      . topical emolient (BIAFINE) emulsion   Topical PRN Lurline Hare, MD        SURGICAL HISTORY:  Past Surgical History  Procedure Date  . Lung lobectomy 1986    left  . Septoplasty   . Total hip arthroplasty 01/2010    left  . Bronchoscopy 02/2011  . Dilation and curettage of uterus 1980's  . Tubal ligation 1980's  . Cardiac catheterization   . Fetal blood transfusion 09/16/11  . Joint replacement 04/13/10    Left Hip    REVIEW OF SYSTEMS:  A comprehensive review of systems was negative except for: Respiratory: positive for dyspnea on exertion   PHYSICAL EXAMINATION: General appearance: alert, cooperative and no distress Head: Normocephalic, without obvious abnormality, atraumatic Neck: no adenopathy Lymph nodes: Cervical, supraclavicular, and axillary nodes normal. Resp: clear to auscultation bilaterally Cardio: regular rate and rhythm, S1, S2 normal, no murmur, click, rub or gallop GI: soft, non-tender; bowel sounds normal; no masses,  no organomegaly Extremities: extremities normal, atraumatic, no cyanosis or edema  ECOG PERFORMANCE STATUS: 1 - Symptomatic but  completely ambulatory  There were no vitals taken for this visit.  LABORATORY DATA: Lab Results  Component Value Date   WBC 8.5 01/17/2012   HGB 10.2* 01/17/2012   HCT 29.2* 01/17/2012   MCV 97.2 01/17/2012   PLT 287 01/17/2012      Chemistry      Component Value Date/Time   NA 137 01/17/2012 1154   NA 142 10/25/2011 1200   NA 134* 09/16/2011 0931   K 4.1 01/17/2012 1154   K 4.6 10/25/2011 1200   K 3.4* 09/16/2011 0931   CL 101 01/17/2012 1154   CL 97* 10/25/2011 1200   CL 94* 09/16/2011 0931   CO2 21* 01/17/2012 1154   CO2 24 10/25/2011 1200   CO2 25 09/16/2011 0931   BUN 23.0 01/17/2012 1154   BUN 15 10/25/2011 1200   BUN 18 09/16/2011 0931   CREATININE 1.1 01/17/2012 1154   CREATININE 0.8 10/25/2011 1200   CREATININE 1.01 09/16/2011 0931      Component Value Date/Time   CALCIUM 10.2 01/17/2012 1154   CALCIUM 9.6 10/25/2011 1200   CALCIUM 9.8 09/16/2011 0931   ALKPHOS 124 01/17/2012 1154   ALKPHOS 106* 10/25/2011 1200   ALKPHOS 64 08/15/2011 0512   AST 16 01/17/2012 1154   AST 25 10/25/2011 1200   AST 18 08/15/2011 0512   ALT 15 01/17/2012 1154   ALT 13 08/15/2011 0512   BILITOT 0.30 01/17/2012 1154   BILITOT 0.60 10/25/2011 1200   BILITOT 0.3 08/15/2011 0512       RADIOGRAPHIC STUDIES: Ct Chest W Contrast  01/17/2012  *RADIOLOGY REPORT*  Clinical Data: History of non-small  cell lung cancer.  Restaging scan. Status post left upper lobectomy.  Chemotherapy and radiation therapy completed.  CT CHEST WITH CONTRAST  Technique:  Multidetector CT imaging of the chest was performed following the standard protocol during bolus administration of intravenous contrast.  Contrast: 80mL OMNIPAQUE IOHEXOL 300 MG/ML  SOLN  Comparison: Chest CT 10/25/2011.  Findings:  Mediastinum: Heart size is normal. Small amount of anterior pericardial fluid and/or thickening, unlikely to be of any hemodynamic significance (and unchanged compared to prior study). There is atherosclerosis of the thoracic aorta, the great vessels of the  mediastinum and the coronary arteries, including calcified atherosclerotic plaque in the left anterior descending coronary artery. No pathologically enlarged mediastinal or hilar lymph nodes. Esophagus is unremarkable in appearance.  Lungs/Pleura: Status post left upper lobectomy.  Trace chronic right-sided pleural effusion has decreased compared to the prior examination.  There continues to be chronic consolidation and architectural distortion in the right mid and upper lung, similar to prior examinations, and the most compatible with evolving postradiation changes.  There is a background of mild centrilobular and paraseptal emphysema. No definite suspicious appearing pulmonary nodules or masses are identified on today's examination.  Upper Abdomen: Small low attenuation left adrenal nodule (previously characterized as an adenoma) is unchanged.  Two calcified gallstones are seen in the dependent portion of the gallbladder.  Visualized portion of the gallbladder appears only moderately distended, without surrounding inflammatory changes to suggest acute cholecystitis at this time.  Atherosclerosis.  Musculoskeletal: There are no aggressive appearing lytic or blastic lesions noted in the visualized portions of the skeleton.  IMPRESSION: 1.  Status post left upper lobectomy and radiation therapy without evidence to suggest recurrent or metastatic disease in the thorax. 2.  Decreasing trace right-sided pleural effusion. 3.  Atherosclerosis, including left anterior descending coronary artery disease.  Assessment for potential risk factor modification, dietary therapy or pharmacologic therapy may be warranted, if clinically indicated. 4.  Cholelithiasis.   Original Report Authenticated By: Florencia Reasons, M.D.     ASSESSMENT: This is a very pleasant 66 years old white female with history of stage IIB/IIIa non-small cell lung cancer, adenocarcinoma status post radiation therapy followed by systemic chemotherapy  with carboplatin and Alimta status post 3 cycles and has been observation since April 2013 was no evidence for disease progression.  PLAN: I discussed the scan results with the patient and her daughter. I recommended for her to continue on observation for now with repeat CT scan of the chest with contrast in 3 months.  The patient was advised to call me immediately if she has any concerning symptoms in the interval.  All questions were answered. The patient knows to call the clinic with any problems, questions or concerns. We can certainly see the patient much sooner if necessary.

## 2012-01-20 NOTE — Patient Instructions (Addendum)
Scan is fine today. Followup in 3 month

## 2012-01-20 NOTE — Telephone Encounter (Signed)
appts made and pt called with all appts    aom

## 2012-01-20 NOTE — Progress Notes (Signed)
Pt c/o 5/10 pain, in her right armpit, chest area.  She has a pain management MD that oversees pain control.  Will inform Dr Donnald Garre.  SLJ     Per Dr Donnald Garre, okay for patient to get a flu shot.  SLJ

## 2012-01-24 ENCOUNTER — Telehealth: Payer: Self-pay | Admitting: Emergency Medicine

## 2012-01-24 NOTE — Telephone Encounter (Signed)
I spoke with pt and she stated she would like to get set up with portable oxygen that can plug into the car cigarette lighter/plug in wall and charge it up. Pt states she is moving in with her daughter. She has a concentrator, oxygen cylinders and 2 fill up bottles. Pt requests [place] to call [employer] and speak with Natale Lay at [phone] regarding this. I called Joe and spoke with him and he stated they need pt last OV note and qualifying sats done in the past year faxed over to [phone].   I spoke with Alida and she is going to check with [facility] to see what qualifying sats they have on pt since we could not find anything in pt cart.

## 2012-01-25 ENCOUNTER — Telehealth: Payer: Self-pay | Admitting: Emergency Medicine

## 2012-01-25 NOTE — Telephone Encounter (Signed)
Ov notes and sats from [facility] faxed to [employer], 01/24/12, spoke with Natale Lay, he has received .Kandice Hams

## 2012-01-25 NOTE — Telephone Encounter (Signed)
Spoke with pt and notified that yes, she should get the flu shot Pt verbalized understanding and states nothing further needed.

## 2012-01-26 ENCOUNTER — Ambulatory Visit (INDEPENDENT_AMBULATORY_CARE_PROVIDER_SITE_OTHER): Payer: Medicare Other

## 2012-01-26 DIAGNOSIS — Z23 Encounter for immunization: Secondary | ICD-10-CM

## 2012-01-27 ENCOUNTER — Other Ambulatory Visit: Payer: Self-pay | Admitting: *Deleted

## 2012-01-27 DIAGNOSIS — Z23 Encounter for immunization: Secondary | ICD-10-CM | POA: Diagnosis not present

## 2012-01-27 MED ORDER — DILTIAZEM HCL ER COATED BEADS 240 MG PO CP24: 240.0000 mg | ORAL_CAPSULE | Freq: Every day | ORAL | Status: DC

## 2012-01-31 ENCOUNTER — Telehealth: Payer: Self-pay | Admitting: Emergency Medicine

## 2012-01-31 ENCOUNTER — Other Ambulatory Visit: Payer: Medicare Other | Admitting: Lab

## 2012-01-31 ENCOUNTER — Other Ambulatory Visit (HOSPITAL_COMMUNITY): Payer: Medicare Other

## 2012-01-31 NOTE — Telephone Encounter (Signed)
Spoke with pt informed her form is in Dr Delton Coombes folder ready for signature, Per Leotis Shames, Dr Delton Coombes nurse says as soon as he gets in will get signature, we will fax to [employer]. 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 She will most likely need order to pick up tanks, but not until [employer] delivers,. Pt states she really needs form filled and faxed today,

## 2012-01-31 NOTE — Telephone Encounter (Signed)
Cmn signed by Dr Delton Coombes and faxed to [facility] spoke with pt and informed .[name]

## 2012-02-02 ENCOUNTER — Ambulatory Visit: Payer: Medicare Other | Admitting: Internal Medicine

## 2012-02-14 ENCOUNTER — Ambulatory Visit: Payer: Medicare Other | Admitting: Emergency Medicine

## 2012-02-23 ENCOUNTER — Telehealth: Payer: Self-pay | Admitting: Medical Oncology

## 2012-02-23 NOTE — Telephone Encounter (Signed)
Pt self referred for mammogram and was told she needs approval from Dr Arbutus Ped due to cancer rx. I referred pt to PCP for mammogram order

## 2012-02-25 ENCOUNTER — Telehealth: Payer: Self-pay

## 2012-02-25 NOTE — Telephone Encounter (Signed)
Phone call from pt. To report an area on the inside of her left lower leg that started as a bruise about 3-4 weeks ago, and now feels like a hard knot.  Denies any swelling of left lower extremity.  Denies any pain with activity.  States sometimes my left leg hurts at night.  Has not reported the above symptoms to her PCP.  Discussed w/ R. Roczniak, PA.  Advised that with pt's diagnosis of lung CA, and tendency to have hypercoagulable state, she should be evaluated by her PCP.  Pt. was advised of the above recommendation and verb. understanding.

## 2012-03-01 ENCOUNTER — Other Ambulatory Visit: Payer: Self-pay | Admitting: Internal Medicine

## 2012-03-01 DIAGNOSIS — N390 Urinary tract infection, site not specified: Secondary | ICD-10-CM | POA: Diagnosis not present

## 2012-03-01 DIAGNOSIS — R350 Frequency of micturition: Secondary | ICD-10-CM | POA: Diagnosis not present

## 2012-03-01 DIAGNOSIS — I8390 Asymptomatic varicose veins of unspecified lower extremity: Secondary | ICD-10-CM | POA: Diagnosis not present

## 2012-03-01 DIAGNOSIS — Z1231 Encounter for screening mammogram for malignant neoplasm of breast: Secondary | ICD-10-CM

## 2012-03-01 DIAGNOSIS — C3490 Malignant neoplasm of unspecified part of unspecified bronchus or lung: Secondary | ICD-10-CM | POA: Diagnosis not present

## 2012-03-03 ENCOUNTER — Ambulatory Visit (INDEPENDENT_AMBULATORY_CARE_PROVIDER_SITE_OTHER): Payer: Medicare Other | Admitting: Emergency Medicine

## 2012-03-03 ENCOUNTER — Other Ambulatory Visit: Payer: Self-pay | Admitting: Internal Medicine

## 2012-03-03 ENCOUNTER — Encounter: Payer: Self-pay | Admitting: Emergency Medicine

## 2012-03-03 VITALS — BP 110/50 | HR 92 | Temp 98.1°F | Ht 66.0 in | Wt 175.6 lb

## 2012-03-03 DIAGNOSIS — C3410 Malignant neoplasm of upper lobe, unspecified bronchus or lung: Secondary | ICD-10-CM

## 2012-03-03 DIAGNOSIS — J449 Chronic obstructive pulmonary disease, unspecified: Secondary | ICD-10-CM | POA: Diagnosis not present

## 2012-03-03 DIAGNOSIS — N644 Mastodynia: Secondary | ICD-10-CM

## 2012-03-03 DIAGNOSIS — J0100 Acute maxillary sinusitis, unspecified: Secondary | ICD-10-CM | POA: Diagnosis not present

## 2012-03-03 NOTE — Assessment & Plan Note (Signed)
Will change the combivent from prn to qid, follow sx

## 2012-03-03 NOTE — Assessment & Plan Note (Signed)
Stable by CT scan 10/13 - following with Dr Arbutus Ped.

## 2012-03-03 NOTE — Addendum Note (Signed)
Addended by: Orma Flaming D on: 03/03/2012 02:19 PM   Modules accepted: Orders

## 2012-03-03 NOTE — Patient Instructions (Addendum)
Please start using your combivent 2 puffs 4 times a day (on a schedule)  Continue your oxygen at all times Follow with Dr Delton Coombes in 3 months or sooner if you have any problems.

## 2012-03-03 NOTE — Progress Notes (Signed)
Subjective:    Patient ID: [name], female    DOB: [birth year], 66 y.o.   MRN: [id number] HPI History of Present Illness:  [name] is a 66 y.o. 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 Has received XRT and chemo with Drs Michell Heinrich and Arbutus Ped.  She tolerated XRT completely. She has completed 3 cycles chemo.  Currently managed on combivent qid, started O2.   ROV 11/26/11 -- COPD with adenoCA 12/12 (chemo/XRT).  Presents today reporting R back and shoulder pain - is musculoskeletal, is involving R chest and R shoulder as well. She was exerting herself 3 weeks ago, believes she was sore afterwards and now with muscle pain. Dr Sander Radon gave her flexeril. [facility] Dr Vear Clock. She is having more cough, which is exacerbating her back and shoulder pain.   ROV 03/03/12 -- COPD with adenoCA 12/12 (chemo/XRT). Currently on combivent, taking it prn. 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 Stable CT scan on 01/17/12. She is having more SOB. Was started on avelox recently for sinusitis, no other exacerbations. Marginal compliance with her O2      Objective:   Physical Exam Filed Vitals:   03/03/12 1345  BP: 110/50  Pulse: 92  Temp: 98.1 F (36.7 C)   Gen: Pleasant, elderly woman, in no distress,  normal affect  ENT: No lesions,  mouth clear,  oropharynx clear, no postnasal drip  Neck: No JVD, no TMG, no carotid bruits  Lungs: No use of accessory muscles, distant, no wheezes or crackles  Cardiovascular: RRR, heart sounds normal, no murmur or gallops, no peripheral edema  Musculoskeletal: No deformities, no cyanosis or clubbing  Neuro: alert, non focal  Skin: Warm, no lesions or rashes   Chest CT 01/17/12 --  Comparison: Chest CT 10/25/2011.  Findings:  Mediastinum: Heart size is normal. Small amount of anterior  pericardial fluid and/or thickening, unlikely to be of any  hemodynamic  significance (and unchanged compared to prior study).  There is atherosclerosis of the thoracic aorta, the great vessels  of the mediastinum and the coronary arteries, including calcified  atherosclerotic plaque in the left anterior descending coronary  artery. No pathologically enlarged mediastinal or hilar lymph  nodes. Esophagus is unremarkable in appearance.  Lungs/Pleura: Status post left upper lobectomy. Trace chronic  right-sided pleural effusion has decreased compared to the prior  examination. There continues to be chronic consolidation and  architectural distortion in the right mid and upper lung, similar  to prior examinations, and the most compatible with evolving  postradiation changes. There is a background of mild centrilobular  and paraseptal emphysema. No definite suspicious appearing  pulmonary nodules or masses are identified on today's examination.  Upper Abdomen: Small low attenuation left adrenal nodule  (previously characterized as an adenoma) is unchanged. Two  calcified gallstones are seen in the dependent portion of the  gallbladder. Visualized portion of the gallbladder appears only  moderately distended, without surrounding inflammatory changes to  suggest acute cholecystitis at this time. Atherosclerosis.  Musculoskeletal: There are no aggressive appearing lytic or blastic  lesions noted in the visualized portions of the skeleton.  IMPRESSION:  1. Status post left upper lobectomy and radiation therapy without  evidence to suggest recurrent or metastatic disease in the thorax.  2. Decreasing trace right-sided pleural effusion.  3. Atherosclerosis, including left anterior descending coronary  artery disease. Assessment for potential risk factor modification,  dietary therapy or pharmacologic therapy may be warranted, if  clinically indicated.  4. Cholelithiasis.       Assessment & Plan:  T1N0 AdenoCA of RUL Stable by CT scan 10/13 - following with Dr  Arbutus Ped.   COPD Will change the combivent from prn to qid, follow sx

## 2012-03-13 ENCOUNTER — Telehealth: Payer: Self-pay | Admitting: Emergency Medicine

## 2012-03-13 NOTE — Telephone Encounter (Signed)
ATCx2, line rang several times, NA, no voicemail. Carron Curie, CMA

## 2012-03-14 NOTE — Telephone Encounter (Signed)
ATC NA NA to leave VM. WCB

## 2012-03-15 NOTE — Telephone Encounter (Signed)
Yes she needs it at all times. Please order the battery if possible.

## 2012-03-15 NOTE — Telephone Encounter (Signed)
LMTCB

## 2012-03-15 NOTE — Telephone Encounter (Signed)
Spoke with patient-states she was seen by RB on 11-22 and was retested for second battery for her O2. Pt needs order sent for second battery and to let the company know that she is on O2 all the time instead of part time as they have documented. RB please advise.

## 2012-03-16 NOTE — Telephone Encounter (Signed)
Pt notified that the order had been sent on 03/03/12 to [employer] for a second battery. We will forward to the [facility] and ask that they check on the status of this order for the p[t.

## 2012-03-20 NOTE — Telephone Encounter (Signed)
Spoke to pt and agreed to refax order that was don 03/03/12 [name]

## 2012-03-23 DIAGNOSIS — J40 Bronchitis, not specified as acute or chronic: Secondary | ICD-10-CM | POA: Diagnosis not present

## 2012-03-24 ENCOUNTER — Telehealth: Payer: Self-pay | Admitting: *Deleted

## 2012-03-24 ENCOUNTER — Ambulatory Visit
Admission: RE | Admit: 2012-03-24 | Discharge: 2012-03-24 | Disposition: A | Discharge status: Home / Self Care | Payer: Medicare Other | Source: Ambulatory Visit | Attending: Internal Medicine | Admitting: Internal Medicine

## 2012-03-24 DIAGNOSIS — C3490 Malignant neoplasm of unspecified part of unspecified bronchus or lung: Secondary | ICD-10-CM | POA: Diagnosis not present

## 2012-03-24 DIAGNOSIS — N644 Mastodynia: Secondary | ICD-10-CM

## 2012-03-24 LAB — HM MAMMOGRAPHY: HM Mammogram: NEGATIVE

## 2012-03-24 NOTE — Telephone Encounter (Signed)
Most recent office note given to Dr Donnald Garre to review.  SLJ

## 2012-03-28 ENCOUNTER — Telehealth: Payer: Self-pay | Admitting: Emergency Medicine

## 2012-03-28 DIAGNOSIS — R05 Cough: Secondary | ICD-10-CM

## 2012-03-28 MED ORDER — LEVOFLOXACIN 750 MG PO TABS: 750.0000 mg | ORAL_TABLET | Freq: Every day | ORAL | Status: DC

## 2012-03-28 MED ORDER — DOXYCYCLINE HYCLATE 100 MG PO TABS: 100.0000 mg | ORAL_TABLET | Freq: Two times a day (BID) | ORAL | Status: DC

## 2012-03-28 MED ORDER — HYDROCOD POLST-CHLORPHEN POLST 10-8 MG/5ML PO LQCR: ORAL | Status: DC

## 2012-03-28 NOTE — Telephone Encounter (Signed)
I spoke with pt and she c/o cough w/ yellow-green phlem, chest congestion, nasal congestion, sore throat, sweats, chills, PND x Saturday. No n/v. She has been taking tussin DM and chlorseptic spray. She is requesting ABX and "something" to help her cough. Since RB is scheduled off will forward to doc of the day. Please advise Dr. Kriste Basque thanks Last OV 03/03/12 No pending OV Allergies  Allergen Reactions  . Iohexol Shortness Of Breath and Other (See Comments)    "burns me up inside"    Pt does fine with 13 hour prep  01/17/12  . Other     CAN'T STAND THE SMELL OF PURE BLEACH; "HAVE TO BACK UP AND POUR AND DON'T BREATH IT TIL i GET CAP BACK ON; DILUTE IT TO SPRAY"  . Penicillins Itching and Other (See Comments)    "scratchy; makes red marks on me, might be from my scratching"  . Codeine Itching and Nausea And Vomiting    REACTION: GI upset

## 2012-03-28 NOTE — Telephone Encounter (Signed)
Pharmacy calling back with interactions between the Levaquin and Citalopram, immediate death as one of the side effects. SN, pls advise if this okay to fill and pt take for 7 days.

## 2012-03-28 NOTE — Telephone Encounter (Signed)
Per SN, change Levaquin to Doxycycline 100 mg #20 BID for 10 days w/ no refills. Pharmacist is aware and they will get this delivered to the pt today.

## 2012-03-28 NOTE — Addendum Note (Signed)
Addended by: Michel Bickers A on: 03/28/2012 04:31 PM   Modules accepted: Orders

## 2012-03-28 NOTE — Telephone Encounter (Signed)
Per SN---ok to call in levaquin 750 mg  #7  1 daily and tussionex  1 tsp every 12 hours as needed for cough.   thanks

## 2012-03-28 NOTE — Telephone Encounter (Signed)
Pt aware of prescriptions and this was called to [facility] on [address] Rock Nephew asked that this be delivered. RX called.

## 2012-03-30 ENCOUNTER — Other Ambulatory Visit: Payer: Self-pay | Admitting: *Deleted

## 2012-03-30 MED ORDER — TRIAMTERENE-HCTZ 37.5-25 MG PO TABS: 0.5000 | ORAL_TABLET | Freq: Every day | ORAL | Status: DC

## 2012-03-30 MED ORDER — DILTIAZEM HCL ER COATED BEADS 240 MG PO CP24: 240.0000 mg | ORAL_CAPSULE | Freq: Every day | ORAL | Status: DC

## 2012-04-11 ENCOUNTER — Ambulatory Visit: Payer: Medicare Other

## 2012-04-12 HISTORY — PX: EYE SURGERY: SHX253

## 2012-04-13 ENCOUNTER — Other Ambulatory Visit: Payer: Self-pay

## 2012-04-13 ENCOUNTER — Telehealth: Payer: Self-pay | Admitting: *Deleted

## 2012-04-13 DIAGNOSIS — C3490 Malignant neoplasm of unspecified part of unspecified bronchus or lung: Secondary | ICD-10-CM

## 2012-04-13 MED ORDER — PREDNISONE 20 MG PO TABS: ORAL_TABLET | ORAL | Status: DC

## 2012-04-13 MED ORDER — DIPHENHYDRAMINE HCL 50 MG PO TABS: ORAL_TABLET | ORAL | Status: DC

## 2012-04-13 NOTE — Telephone Encounter (Signed)
Opened in error

## 2012-04-14 ENCOUNTER — Ambulatory Visit: Payer: Medicare Other

## 2012-04-17 ENCOUNTER — Telehealth: Payer: Self-pay | Admitting: Internal Medicine

## 2012-04-17 NOTE — Telephone Encounter (Signed)
pt called in to r/s her appts as it will be too cold for her to get out on 1/7,done      anne

## 2012-04-18 ENCOUNTER — Ambulatory Visit (HOSPITAL_COMMUNITY): Payer: Medicare Other

## 2012-04-18 ENCOUNTER — Other Ambulatory Visit: Payer: Medicare Other | Admitting: Lab

## 2012-04-20 ENCOUNTER — Ambulatory Visit: Payer: Medicare Other | Admitting: Internal Medicine

## 2012-04-24 ENCOUNTER — Other Ambulatory Visit (HOSPITAL_BASED_OUTPATIENT_CLINIC_OR_DEPARTMENT_OTHER): Payer: Medicare Other | Admitting: Lab

## 2012-04-24 ENCOUNTER — Ambulatory Visit (HOSPITAL_COMMUNITY)
Admission: RE | Admit: 2012-04-24 | Discharge: 2012-04-24 | Disposition: A | Discharge status: Home / Self Care | Payer: Medicare Other | Source: Ambulatory Visit | Attending: Internal Medicine | Admitting: Internal Medicine

## 2012-04-24 DIAGNOSIS — C3410 Malignant neoplasm of upper lobe, unspecified bronchus or lung: Secondary | ICD-10-CM

## 2012-04-24 DIAGNOSIS — E042 Nontoxic multinodular goiter: Secondary | ICD-10-CM | POA: Diagnosis not present

## 2012-04-24 DIAGNOSIS — M949 Disorder of cartilage, unspecified: Secondary | ICD-10-CM | POA: Insufficient documentation

## 2012-04-24 DIAGNOSIS — J984 Other disorders of lung: Secondary | ICD-10-CM | POA: Diagnosis not present

## 2012-04-24 DIAGNOSIS — K8020 Calculus of gallbladder without cholecystitis without obstruction: Secondary | ICD-10-CM | POA: Diagnosis not present

## 2012-04-24 DIAGNOSIS — J701 Chronic and other pulmonary manifestations due to radiation: Secondary | ICD-10-CM | POA: Diagnosis not present

## 2012-04-24 DIAGNOSIS — Y842 Radiological procedure and radiotherapy as the cause of abnormal reaction of the patient, or of later complication, without mention of misadventure at the time of the procedure: Secondary | ICD-10-CM | POA: Insufficient documentation

## 2012-04-24 DIAGNOSIS — C3490 Malignant neoplasm of unspecified part of unspecified bronchus or lung: Secondary | ICD-10-CM | POA: Diagnosis not present

## 2012-04-24 DIAGNOSIS — M899 Disorder of bone, unspecified: Secondary | ICD-10-CM | POA: Diagnosis not present

## 2012-04-24 DIAGNOSIS — E279 Disorder of adrenal gland, unspecified: Secondary | ICD-10-CM | POA: Insufficient documentation

## 2012-04-24 LAB — CBC WITH DIFFERENTIAL/PLATELET
BASO%: 0.1 % (ref 0.0–2.0)
EOS%: 0 % (ref 0.0–7.0)
LYMPH%: 8.1 % — ABNORMAL LOW (ref 14.0–49.7)
MCH: 32.5 pg (ref 25.1–34.0)
MCHC: 33.9 g/dL (ref 31.5–36.0)
MCV: 95.8 fL (ref 79.5–101.0)
MONO#: 0.1 10*3/uL (ref 0.1–0.9)
MONO%: 0.9 % (ref 0.0–14.0)
Platelets: 268 10*3/uL (ref 145–400)
RBC: 3.24 10*6/uL — ABNORMAL LOW (ref 3.70–5.45)
WBC: 10.8 10*3/uL — ABNORMAL HIGH (ref 3.9–10.3)

## 2012-04-24 LAB — COMPREHENSIVE METABOLIC PANEL (CC13)
ALT: 11 U/L (ref 0–55)
AST: 15 U/L (ref 5–34)
Alkaline Phosphatase: 117 U/L (ref 40–150)
Creatinine: 1.3 mg/dL — ABNORMAL HIGH (ref 0.6–1.1)
Sodium: 138 mEq/L (ref 136–145)
Total Bilirubin: 0.37 mg/dL (ref 0.20–1.20)
Total Protein: 7.4 g/dL (ref 6.4–8.3)

## 2012-04-24 MED ORDER — IOHEXOL 300 MG/ML  SOLN
80.0000 mL | Freq: Once | INTRAMUSCULAR | Status: AC | PRN
  Administered 2012-04-24: 80 mL via INTRAVENOUS

## 2012-04-25 ENCOUNTER — Other Ambulatory Visit: Payer: Self-pay | Admitting: Cardiovascular Disease

## 2012-04-25 ENCOUNTER — Encounter: Payer: Self-pay | Admitting: Radiation Oncology

## 2012-04-25 ENCOUNTER — Other Ambulatory Visit: Payer: Self-pay | Admitting: *Deleted

## 2012-04-25 DIAGNOSIS — I2510 Atherosclerotic heart disease of native coronary artery without angina pectoris: Secondary | ICD-10-CM | POA: Insufficient documentation

## 2012-04-25 DIAGNOSIS — R011 Cardiac murmur, unspecified: Secondary | ICD-10-CM | POA: Insufficient documentation

## 2012-04-25 DIAGNOSIS — M419 Scoliosis, unspecified: Secondary | ICD-10-CM | POA: Insufficient documentation

## 2012-04-25 DIAGNOSIS — R55 Syncope and collapse: Secondary | ICD-10-CM | POA: Insufficient documentation

## 2012-04-25 DIAGNOSIS — I209 Angina pectoris, unspecified: Secondary | ICD-10-CM | POA: Insufficient documentation

## 2012-04-25 DIAGNOSIS — G2581 Restless legs syndrome: Secondary | ICD-10-CM | POA: Insufficient documentation

## 2012-04-25 DIAGNOSIS — Z86718 Personal history of other venous thrombosis and embolism: Secondary | ICD-10-CM | POA: Insufficient documentation

## 2012-04-25 DIAGNOSIS — I499 Cardiac arrhythmia, unspecified: Secondary | ICD-10-CM | POA: Insufficient documentation

## 2012-04-25 DIAGNOSIS — Z9289 Personal history of other medical treatment: Secondary | ICD-10-CM | POA: Insufficient documentation

## 2012-04-25 DIAGNOSIS — J449 Chronic obstructive pulmonary disease, unspecified: Secondary | ICD-10-CM | POA: Insufficient documentation

## 2012-04-25 DIAGNOSIS — Z923 Personal history of irradiation: Secondary | ICD-10-CM | POA: Insufficient documentation

## 2012-04-25 DIAGNOSIS — E1142 Type 2 diabetes mellitus with diabetic polyneuropathy: Secondary | ICD-10-CM | POA: Insufficient documentation

## 2012-04-25 DIAGNOSIS — F99 Mental disorder, not otherwise specified: Secondary | ICD-10-CM | POA: Insufficient documentation

## 2012-04-25 DIAGNOSIS — K219 Gastro-esophageal reflux disease without esophagitis: Secondary | ICD-10-CM | POA: Insufficient documentation

## 2012-04-25 DIAGNOSIS — Z8709 Personal history of other diseases of the respiratory system: Secondary | ICD-10-CM | POA: Insufficient documentation

## 2012-04-25 DIAGNOSIS — C3490 Malignant neoplasm of unspecified part of unspecified bronchus or lung: Secondary | ICD-10-CM | POA: Insufficient documentation

## 2012-04-25 DIAGNOSIS — N159 Renal tubulo-interstitial disease, unspecified: Secondary | ICD-10-CM | POA: Insufficient documentation

## 2012-04-25 DIAGNOSIS — M797 Fibromyalgia: Secondary | ICD-10-CM | POA: Insufficient documentation

## 2012-04-25 DIAGNOSIS — I5032 Chronic diastolic (congestive) heart failure: Secondary | ICD-10-CM | POA: Insufficient documentation

## 2012-04-25 DIAGNOSIS — M5136 Other intervertebral disc degeneration, lumbar region: Secondary | ICD-10-CM | POA: Insufficient documentation

## 2012-04-25 MED ORDER — IPRATROPIUM-ALBUTEROL 18-103 MCG/ACT IN AERO: 1.0000 | INHALATION_SPRAY | Freq: Four times a day (QID) | RESPIRATORY_TRACT | Status: DC | PRN

## 2012-04-26 ENCOUNTER — Ambulatory Visit: Payer: Medicare Other | Admitting: Internal Medicine

## 2012-04-26 ENCOUNTER — Ambulatory Visit (HOSPITAL_BASED_OUTPATIENT_CLINIC_OR_DEPARTMENT_OTHER): Payer: Medicare Other | Admitting: Internal Medicine

## 2012-04-26 ENCOUNTER — Telehealth: Payer: Self-pay | Admitting: Internal Medicine

## 2012-04-26 ENCOUNTER — Encounter: Payer: Self-pay | Admitting: Internal Medicine

## 2012-04-26 VITALS — BP 145/49 | HR 108 | Temp 98.0°F | Resp 20 | Ht 66.0 in | Wt 171.2 lb

## 2012-04-26 DIAGNOSIS — C3410 Malignant neoplasm of upper lobe, unspecified bronchus or lung: Secondary | ICD-10-CM | POA: Diagnosis not present

## 2012-04-26 DIAGNOSIS — M899 Disorder of bone, unspecified: Secondary | ICD-10-CM

## 2012-04-26 DIAGNOSIS — M949 Disorder of cartilage, unspecified: Secondary | ICD-10-CM | POA: Diagnosis not present

## 2012-04-26 DIAGNOSIS — R0602 Shortness of breath: Secondary | ICD-10-CM

## 2012-04-26 DIAGNOSIS — M549 Dorsalgia, unspecified: Secondary | ICD-10-CM | POA: Diagnosis not present

## 2012-04-26 DIAGNOSIS — C3490 Malignant neoplasm of unspecified part of unspecified bronchus or lung: Secondary | ICD-10-CM

## 2012-04-26 MED ORDER — PREDNISONE 20 MG PO TABS: ORAL_TABLET | ORAL | Status: DC

## 2012-04-26 MED ORDER — DIPHENHYDRAMINE HCL 50 MG PO TABS: ORAL_TABLET | ORAL | Status: DC

## 2012-04-26 NOTE — Patient Instructions (Signed)
No evidence for disease progression on his recent CT scan of the chest. I will order MRI of the thoracic spine to rule out metastatic disease at the T5 level. Followup in 3 months with repeat CT scan of the chest.

## 2012-04-26 NOTE — Progress Notes (Signed)
[facility] [phone]   [phone]  OFFICE PROGRESS NOTE  [name]., MD [address] [employer] [place] [address]  DIAGNOSIS: Stage IIB/IIIA non-small cell lung cancer consistent with adenocarcinoma diagnosed in December 2012.   PRIOR THERAPY:  1) Status post radiation therapy to the right upper lobe lung mass completed on 06/04/2011 under the care of Dr. Michell Fry.  2) Systemic chemotherapy with carboplatin for AUC of 5 and Alimta 500 mg/M2 giving every 3 weeks, status post 3 cycles, last dose was given on 07/21/2011.   CURRENT THERAPY: Observation.  INTERVAL HISTORY: [name] 67 y.o. female returns to the clinic today for followup visit accompanied her daughter. The patient is feeling fine today with except for the baseline shortness of breath and she is currently on home oxygen. She denied having any significant chest pain, cough or hemoptysis. She is followed by Dr. Delton Fry for her COPD. The patient denied having any significant weight loss or night sweats. She continues to complain of mild pain in the midback. She had repeat CT scan of the chest performed recently and she is here for evaluation and discussion of her scan results.  MEDICAL HISTORY: Past Medical History  Diagnosis Date  . 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 Restless legs syndrome (RLS)   . Chronic sinusitis   . Hypertension   . Hyperlipidemia   . Anemia, unspecified   . GERD (gastroesophageal reflux disease)   . Respiratory failure with hypoxia 01/02/2010  . Complication of anesthesia     slow to wake up  . Asthma   . Coronary artery disease     Dr. Excell Seltzer had stress test done  . Type II or unspecified type diabetes mellitus without mention of complication, not stated as uncontrolled   . Peripheral vascular disease   . PAD (peripheral artery disease)   . Heart murmur   . Dysrhythmia     irregular  . Anginal pain   . History of  blood clots     "2 in my aorta"  . Hard of hearing, right   . History of bronchitis   . Bronchial pneumonia     history  . COPD (chronic obstructive pulmonary disease)   . Emphysema   . Shortness of breath     "anytime really"  . Syncope and collapse   . Adenocarcinoma, lung     upper left lobe  . 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 Chemotherapy adverse reaction 09/15/11    "makes me light headed and pass out; this is 3rd blood transfusion since going thru it"  . History of blood transfusion   . Kidney infection     "related to chemo"  . Sinus headache     "all the time"  . Arthritis     "all over my body"  . Fibromyalgia   . Osteoporosis   . DDD (degenerative disc disease), lumbar   . Scoliosis   . Anxiety   . Mental disorder   . Reflux   . CHF (congestive heart failure)   . DVT (deep venous thrombosis)   . Hx of radiation therapy 04/28/11 -06/08/11    right upper lobe-lung    ALLERGIES:  is allergic to iohexol; other; penicillins; and codeine.  MEDICATIONS:  Current Outpatient Prescriptions  Medication Sig Dispense Refill  . albuterol-ipratropium (COMBIVENT) 18-103 MCG/ACT inhaler Inhale 1 puff into the lungs every  6 (six) hours as needed. For shortness of breath  2 Inhaler  0  . ALPRAZolam (XANAX) 1 MG tablet Take 1 mg by mouth 3 (three) times daily as needed. For anxiety      . aspirin EC 325 MG tablet Hold for 1 week then resume taking one tablet by mouth daily      . chlorpheniramine-HYDROcodone (TUSSIONEX) 10-8 MG/5ML LQCR 1 tbsp every 12 hours if needed for cough  120 mL  0  . citalopram (CELEXA) 40 MG tablet Take 0.5 tablets (20 mg total) by mouth daily.      . cyclobenzaprine (FLEXERIL) 5 MG tablet Take 5 mg by mouth 3 (three) times daily as needed.      . diltiazem (CARDIZEM CD) 240 MG 24 hr capsule TAKE ONE (1) CAPSULE EACH DAY  30 capsule  0  . diphenhydrAMINE (BENADRYL) 50 MG tablet Take 1 tablet 1 hour prior to CT  SCAn  1 tablet  0  . doxycycline (VIBRA-TABS) 100 MG tablet Take 1 tablet (100 mg total) by mouth 2 (two) times daily.  20 tablet  0  . gabapentin (NEURONTIN) 100 MG capsule Take 100 mg by mouth 3 (three) times daily. 1 capsule each night x 10days, then tirate up to 2 tabs at night for 10 days, increase to 3 per night.      Marland Kitchen gemfibrozil (LOPID) 600 MG tablet Take 600 mg by mouth daily.       Marland Kitchen HYDROcodone-acetaminophen (VICODIN ES) 7.5-750 MG per tablet Take 1 tablet by mouth every 6 (six) hours as needed. For severe pain      . Lactobacillus (ACIDOPHILUS PO) Take 1 tablet by mouth daily.      Marland Kitchen levofloxacin (LEVAQUIN) 750 MG tablet Take 1 tablet (750 mg total) by mouth daily.  7 tablet  0  . loratadine (CLARITIN) 10 MG tablet Take 1 tablet (10 mg total) by mouth daily.  30 tablet  3  . moxifloxacin (AVELOX) 400 MG tablet Take 400 mg by mouth daily. X 5 days      . Multiple Vitamins-Minerals (CENTRUM SILVER PO) Take 1 tablet by mouth daily.      . pantoprazole (PROTONIX) 40 MG tablet Take 1 tablet (40 mg total) by mouth daily.  30 tablet  1  . potassium chloride SA (K-DUR,KLOR-CON) 20 MEQ tablet Take 1 tablet (20 mEq total) by mouth 2 (two) times daily.  60 tablet  6  . predniSONE (DELTASONE) 20 MG tablet Take 2 1/2 tablets 13 hours prior to CT SCAN then  2 1/2 tabs 7 hours prior to ct scan, and then 2 1/2 tabs 1 hour prior to CT scan.  8 tablet  0  . traMADol (ULTRAM) 50 MG tablet Take 50 mg by mouth every 4 (four) hours as needed. For moderate pain      . triamcinolone ointment (KENALOG) 0.1 % Apply topically 2 (two) times daily.      Marland Kitchen triamterene-hydrochlorothiazide (MAXZIDE-25) 37.5-25 MG per tablet Take 0.5 each (0.5 tablets total) by mouth daily.  30 tablet  0  . [DISCONTINUED] calcium carbonate 200 MG capsule Take 250 mg by mouth daily. Does not take everyday       No current facility-administered medications for this visit.   Facility-Administered Medications Ordered in Other Visits    Medication Dose Route Frequency Provider Last Rate Last Dose  . hyaluronate sodium (RADIAPLEXRX) gel   Topical Once Kathleen Hare, MD      . topical emolient (BIAFINE)  emulsion   Topical PRN Kathleen Hare, MD        SURGICAL HISTORY:  Past Surgical History  Procedure Date  . Lung lobectomy 1986    left  . Septoplasty   . Total hip arthroplasty 01/2010    left  . Bronchoscopy 02/2011  . Dilation and curettage of uterus 1980's  . Tubal ligation 1980's  . Cardiac catheterization   . Fetal blood transfusion 09/16/11  . Joint replacement 04/13/10    Left Hip    REVIEW OF SYSTEMS:  A comprehensive review of systems was negative except for: Respiratory: positive for dyspnea on exertion and wheezing Musculoskeletal: positive for back pain   PHYSICAL EXAMINATION: General appearance: alert, cooperative and no distress Head: Normocephalic, without obvious abnormality, atraumatic Neck: no adenopathy Lymph nodes: Cervical, supraclavicular, and axillary nodes normal. Resp: wheezes bilaterally Back: Mild tenderness to palpation in the upper back Cardio: regular rate and rhythm, S1, S2 normal, no murmur, click, rub or gallop GI: soft, non-tender; bowel sounds normal; no masses,  no organomegaly Extremities: extremities normal, atraumatic, no cyanosis or edema Neurologic: Alert and oriented X 3, normal strength and tone. Normal symmetric reflexes. Normal coordination and gait  ECOG PERFORMANCE STATUS: 1 - Symptomatic but completely ambulatory  Blood pressure 145/49, pulse 108, temperature 98 F (36.7 C), temperature source Oral, resp. rate 20, height 5\' 6"  (1.676 m), weight 171 lb 3.2 oz (77.656 kg).  LABORATORY DATA: Lab Results  Component Value Date   WBC 10.8* 04/24/2012   HGB 10.5* 04/24/2012   HCT 31.0* 04/24/2012   MCV 95.8 04/24/2012   PLT 268 04/24/2012      Chemistry      Component Value Date/Time   NA 138 04/24/2012 1141   NA 142 10/25/2011 1200   NA 134* 09/16/2011 0931    K 3.8 04/24/2012 1141   K 4.6 10/25/2011 1200   K 3.4* 09/16/2011 0931   CL 99 04/24/2012 1141   CL 97* 10/25/2011 1200   CL 94* 09/16/2011 0931   CO2 25 04/24/2012 1141   CO2 24 10/25/2011 1200   CO2 25 09/16/2011 0931   BUN 24.0 04/24/2012 1141   BUN 15 10/25/2011 1200   BUN 18 09/16/2011 0931   CREATININE 1.3* 04/24/2012 1141   CREATININE 0.8 10/25/2011 1200   CREATININE 1.01 09/16/2011 0931      Component Value Date/Time   CALCIUM 10.3 04/24/2012 1141   CALCIUM 9.6 10/25/2011 1200   CALCIUM 9.8 09/16/2011 0931   ALKPHOS 117 04/24/2012 1141   ALKPHOS 106* 10/25/2011 1200   ALKPHOS 64 08/15/2011 0512   AST 15 04/24/2012 1141   AST 25 10/25/2011 1200   AST 18 08/15/2011 0512   ALT 11 04/24/2012 1141   ALT 13 08/15/2011 0512   BILITOT 0.37 04/24/2012 1141   BILITOT 0.60 10/25/2011 1200   BILITOT 0.3 08/15/2011 0512       RADIOGRAPHIC STUDIES: Ct Chest W Contrast  04/24/2012  *RADIOLOGY REPORT*  Clinical Data: Right lung cancer  CT CHEST WITH CONTRAST  Technique:  Multidetector CT imaging of the chest was performed following the standard protocol during bolus administration of intravenous contrast.  Contrast: 80mL OMNIPAQUE IOHEXOL 300 MG/ML  SOLN  Comparison: 01/17/2012  Findings: No axillary lymphadenopathy.  Tiny left thyroid nodules are stable.  No mediastinal lymphadenopathy.  Distortion of left hilar anatomy is compatible with previous surgery.  There is a stable confluent opacity in the posterior right upper lobe, likely reflecting postradiation change.  Heart size  is stable.  No substantial pericardial effusion.  No evidence for pleural effusion.  Lung windows again show post radiation confluent fibrosis in the posteromedial right upper lobe.  There is some stable scarring in the posterior left upper lung and pulmonary venous anatomy in the left hilum is compatible with previous left upper lobe resection.  Images which include the upper abdomen again show calcified stones in the gallbladder.  Stable appearance  of 11 mm left adrenal nodule, characterized previously has benign adrenal adenoma.  Bone windows show a new superior endplate compression fracture involving the T5 vertebral body.  There is some associated sclerosis at the level of T5.  The  IMPRESSION: No substantial change and cardiopulmonary exam.  New sclerosis in the T5 vertebral body with superior endplate depression.  Metastatic involvement cannot be excluded.  MRI without and with contrast may prove helpful to further evaluate.   Original Report Authenticated By: Kennith Center, M.D.     ASSESSMENT: This is a very pleasant 67 years old white female with unresectable stage IIB/IIIa non-small cell lung cancer, adenocarcinoma status post radiotherapy to the right upper lobe lung mass followed by 3 cycles of systemic chemotherapy with carboplatin and Alimta. The patient has no evidence for disease progression on his recent scan of the chest. But there was questionable new or sclerotic lesion in the T5 vertebral body.  PLAN: I discussed the scan results and showed the images to the patient and her daughter today. I recommended for her to continue on observation with repeat CT scan of the chest with contrast in 3 months. For the questionable T5 lesion, I will order the MRI of the thoracic spine to rule out any metastatic disease to this area. The patient was advised to call immediately if she has any concerning symptoms in the interval.  All questions were answered. The patient knows to call the clinic with any problems, questions or concerns. We can certainly see the patient much sooner if necessary.

## 2012-04-26 NOTE — Telephone Encounter (Signed)
Gave pt appt for April 2014 lab and MD, gave referral to [facility] for MRI this week,CT will be scheduled by radiology

## 2012-04-27 ENCOUNTER — Ambulatory Visit: Payer: Medicare Other | Admitting: Radiation Oncology

## 2012-05-02 DIAGNOSIS — D630 Anemia in neoplastic disease: Secondary | ICD-10-CM | POA: Diagnosis not present

## 2012-05-02 DIAGNOSIS — K5900 Constipation, unspecified: Secondary | ICD-10-CM | POA: Diagnosis not present

## 2012-05-02 DIAGNOSIS — M546 Pain in thoracic spine: Secondary | ICD-10-CM | POA: Diagnosis not present

## 2012-05-02 DIAGNOSIS — E559 Vitamin D deficiency, unspecified: Secondary | ICD-10-CM | POA: Diagnosis not present

## 2012-05-02 DIAGNOSIS — E119 Type 2 diabetes mellitus without complications: Secondary | ICD-10-CM | POA: Diagnosis not present

## 2012-05-03 ENCOUNTER — Ambulatory Visit (HOSPITAL_COMMUNITY)
Admission: RE | Admit: 2012-05-03 | Discharge: 2012-05-03 | Disposition: A | Discharge status: Home / Self Care | Payer: Medicare Other | Source: Ambulatory Visit | Attending: Internal Medicine | Admitting: Internal Medicine

## 2012-05-03 DIAGNOSIS — C3490 Malignant neoplasm of unspecified part of unspecified bronchus or lung: Secondary | ICD-10-CM | POA: Diagnosis not present

## 2012-05-03 DIAGNOSIS — M8448XA Pathological fracture, other site, initial encounter for fracture: Secondary | ICD-10-CM | POA: Diagnosis not present

## 2012-05-03 DIAGNOSIS — K8020 Calculus of gallbladder without cholecystitis without obstruction: Secondary | ICD-10-CM | POA: Insufficient documentation

## 2012-05-03 DIAGNOSIS — G96198 Other disorders of meninges, not elsewhere classified: Secondary | ICD-10-CM | POA: Diagnosis not present

## 2012-05-08 ENCOUNTER — Other Ambulatory Visit: Payer: Self-pay | Admitting: Internal Medicine

## 2012-05-08 ENCOUNTER — Telehealth: Payer: Self-pay | Admitting: Medical Oncology

## 2012-05-08 ENCOUNTER — Telehealth: Payer: Self-pay | Admitting: Internal Medicine

## 2012-05-08 DIAGNOSIS — IMO0002 Reserved for concepts with insufficient information to code with codable children: Secondary | ICD-10-CM

## 2012-05-08 DIAGNOSIS — C3490 Malignant neoplasm of unspecified part of unspecified bronchus or lung: Secondary | ICD-10-CM

## 2012-05-08 NOTE — Addendum Note (Signed)
Addended by: Si Gaul on: 05/08/2012 03:11 PM   Modules accepted: Orders

## 2012-05-08 NOTE — Telephone Encounter (Signed)
Wants results of MRI -next ov is april

## 2012-05-08 NOTE — Telephone Encounter (Signed)
Per Dr Arbutus Ped - He referred her to IR for T 4 pathologic fracture lesion. I notified pt.

## 2012-05-08 NOTE — Telephone Encounter (Signed)
S/w jennifer with dr Erlene Senters and she will fix the order for ir and will call the pt    [name]

## 2012-05-15 ENCOUNTER — Telehealth: Payer: Self-pay | Admitting: *Deleted

## 2012-05-15 NOTE — Telephone Encounter (Signed)
Per patient phone call, She wanted to know her appt date and time. I have given her the time and date.  JWM

## 2012-05-16 ENCOUNTER — Telehealth: Payer: Self-pay | Admitting: Emergency Medicine

## 2012-05-16 ENCOUNTER — Ambulatory Visit (INDEPENDENT_AMBULATORY_CARE_PROVIDER_SITE_OTHER): Payer: Medicare Other | Admitting: Emergency Medicine

## 2012-05-16 ENCOUNTER — Encounter: Payer: Self-pay | Admitting: Emergency Medicine

## 2012-05-16 VITALS — BP 128/60 | HR 107 | Temp 98.4°F | Ht 66.0 in | Wt 176.0 lb

## 2012-05-16 DIAGNOSIS — J449 Chronic obstructive pulmonary disease, unspecified: Secondary | ICD-10-CM

## 2012-05-16 DIAGNOSIS — C3490 Malignant neoplasm of unspecified part of unspecified bronchus or lung: Secondary | ICD-10-CM

## 2012-05-16 DIAGNOSIS — R05 Cough: Secondary | ICD-10-CM | POA: Diagnosis not present

## 2012-05-16 MED ORDER — PREDNISONE 50 MG PO TABS: 50.0000 mg | ORAL_TABLET | Freq: Every day | ORAL | Status: DC

## 2012-05-16 MED ORDER — FLUTICASONE FUROATE 27.5 MCG/SPRAY NA SUSP: 2.0000 | Freq: Two times a day (BID) | NASAL | Status: DC

## 2012-05-16 MED ORDER — MOMETASONE FUROATE 50 MCG/ACT NA SUSP: 2.0000 | Freq: Two times a day (BID) | NASAL | Status: DC

## 2012-05-16 MED ORDER — LORATADINE 10 MG PO TABS: 10.0000 mg | ORAL_TABLET | Freq: Every day | ORAL | Status: DC

## 2012-05-16 NOTE — Telephone Encounter (Signed)
Please call patient and tell her that prednisone scri[pt sent. I think I gave her nasonex samples already, but may want to confirm

## 2012-05-16 NOTE — Telephone Encounter (Signed)
Pt seen by RB earlier this morning: Patient Instructions     Prednisone for 5 days and then stop  Start claritin (loratadine) every day  Start fluticasone nasal spray, 2 sprays each side twice a day  Start nasal saline washes every day  Use your combivent as needed for shortness of breath  Follow with Dr Delton Coombes in 1 month   Dr Delton Coombes, please advise on the prednisone > mg, quantity, refills. Was the patient given a sample of Nasonex in the office or does a script need to be sent?   Please advise, thanks.

## 2012-05-16 NOTE — Assessment & Plan Note (Signed)
C T scan 04/25/12 -- stable but possible T5 lesion; MRI reassuring

## 2012-05-16 NOTE — Telephone Encounter (Signed)
Called spoke with patient, advised RB sent her prednisone.  Pt stated that she was not given a nasonex sample in the office.  Requesting rx for this and claritin (her insurance covers otc meds) be sent to [facility] and wants her meds delivered.  Rx's sent. Called [facility] spoke with pharmacist Johnny, and requested pt's rx's be delivered.  Johnny did report that they do not have veramyst in stock.  Advised ok to change to nasonex (same directions) as this was the med pt was going to be given sample of.  MAR updated.  Will sign and forward to RB to make him aware.

## 2012-05-16 NOTE — Patient Instructions (Addendum)
Prednisone for 5 days and then stop Start claritin (loratadine) every day Start fluticasone nasal spray, 2 sprays each side twice a day Start nasal saline washes every day Use your combivent as needed for shortness of breath Follow with Dr Delton Coombes in 1 month

## 2012-05-16 NOTE — Progress Notes (Signed)
  Subjective:    Patient ID: [name], female    DOB: 09/23/1945, 67 y.o.   MRN: [id number] HPI History of Present Illness:  [name] is a 67 y.o. 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 Has received XRT and chemo with Drs Michell Heinrich and Arbutus Ped.  She tolerated XRT completely. She has completed 3 cycles chemo.  Currently managed on combivent qid, started O2.   ROV 11/26/11 -- COPD with adenoCA 12/12 (chemo/XRT).  Presents today reporting R back and shoulder pain - is musculoskeletal, is involving R chest and R shoulder as well. She was exerting herself 3 weeks ago, believes she was sore afterwards and now with muscle pain. Dr Sander Radon gave her flexeril. [facility] Dr Vear Clock. She is having more cough, which is exacerbating her back and shoulder pain.   ROV 03/03/12 -- COPD with adenoCA 12/12 (chemo/XRT). Currently on combivent, taking it prn. 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 Stable CT scan on 01/17/12. She is having more SOB. Was started on avelox recently for sinusitis, no other exacerbations. Marginal compliance with her O2  ROV 05/16/12 -- COPD with adenoCA 12/12. She developed URI sx, nasal obstruction, clear to yellow nasal congestion, scratchy throat. She tried restarting restarting claritin and mucinex-D a couple weeks ago. She has been using both prn - little improvement. She has a throat trickle, cough that is sometimes productive - white, yellow, clear. Using combivent twice a day for last two weeks > helps her cough.       Objective:   Physical Exam Filed Vitals:   05/16/12 1123  BP: 128/60  Pulse: 107  Temp: 98.4 F (36.9 C)   Gen: Pleasant, elderly woman, in no distress,  normal affect  ENT: No lesions,  mouth clear,  oropharynx clear, no postnasal drip  Neck: No JVD, no TMG, no carotid bruits  Lungs: No use of accessory muscles, distant, no wheezes or  crackles  Cardiovascular: RRR, heart sounds normal, no murmur or gallops, no peripheral edema  Musculoskeletal: No deformities, no cyanosis or clubbing  Neuro: alert, non focal  Skin: Warm, no lesions or rashes   CT scan 04/24/12 --  No substantial change and cardiopulmonary exam.  New sclerosis in the T5 vertebral body with superior endplate  depression. Metastatic involvement cannot be excluded. MRI  without and with contrast may prove helpful to further evaluate      Assessment & Plan:  Non-small cell lung cancer C T scan 04/25/12 -- stable but possible T5 lesion; MRI reassuring  COPD Continue combivent prn; low threshold to change to scheduled regimen; she has done prn even when we discussed a schedule  Chronic cough Seems to have an exacerbation UA irritation, ? Due to recent URI vs allergies.  - start NSW's daily - loratadine and nasal steroid every day, not prn - pred x 5 days then stop - rov 1 month

## 2012-05-16 NOTE — Assessment & Plan Note (Signed)
Continue combivent prn; low threshold to change to scheduled regimen; she has done prn even when we discussed a schedule

## 2012-05-16 NOTE — Assessment & Plan Note (Signed)
Seems to have an exacerbation UA irritation, ? Due to recent URI vs allergies.  - start NSW's daily - loratadine and nasal steroid every day, not prn - pred x 5 days then stop - rov 1 month

## 2012-05-17 ENCOUNTER — Ambulatory Visit (HOSPITAL_COMMUNITY)
Admission: RE | Admit: 2012-05-17 | Discharge: 2012-05-17 | Disposition: A | Discharge status: Home / Self Care | Payer: Medicare Other | Source: Ambulatory Visit | Attending: Internal Medicine | Admitting: Internal Medicine

## 2012-05-17 ENCOUNTER — Other Ambulatory Visit (HOSPITAL_COMMUNITY): Payer: Self-pay | Admitting: Interventional Radiology

## 2012-05-17 DIAGNOSIS — C3490 Malignant neoplasm of unspecified part of unspecified bronchus or lung: Secondary | ICD-10-CM

## 2012-05-17 DIAGNOSIS — IMO0002 Reserved for concepts with insufficient information to code with codable children: Secondary | ICD-10-CM

## 2012-05-17 DIAGNOSIS — M8448XA Pathological fracture, other site, initial encounter for fracture: Secondary | ICD-10-CM | POA: Diagnosis not present

## 2012-05-18 ENCOUNTER — Other Ambulatory Visit: Payer: Self-pay | Admitting: Radiology

## 2012-05-22 ENCOUNTER — Other Ambulatory Visit: Payer: Self-pay | Admitting: Radiology

## 2012-05-22 ENCOUNTER — Encounter (HOSPITAL_COMMUNITY): Payer: Self-pay | Admitting: Pharmacy Technician

## 2012-05-23 ENCOUNTER — Encounter (HOSPITAL_COMMUNITY): Payer: Self-pay

## 2012-05-23 ENCOUNTER — Ambulatory Visit (HOSPITAL_COMMUNITY)
Admission: RE | Admit: 2012-05-23 | Discharge: 2012-05-23 | Disposition: A | Discharge status: Home / Self Care | Payer: Medicare Other | Source: Ambulatory Visit | Attending: Interventional Radiology | Admitting: Interventional Radiology

## 2012-05-23 ENCOUNTER — Other Ambulatory Visit (HOSPITAL_COMMUNITY): Payer: Self-pay | Admitting: Interventional Radiology

## 2012-05-23 DIAGNOSIS — M8448XA Pathological fracture, other site, initial encounter for fracture: Secondary | ICD-10-CM | POA: Insufficient documentation

## 2012-05-23 DIAGNOSIS — Z85118 Personal history of other malignant neoplasm of bronchus and lung: Secondary | ICD-10-CM | POA: Insufficient documentation

## 2012-05-23 DIAGNOSIS — IMO0002 Reserved for concepts with insufficient information to code with codable children: Secondary | ICD-10-CM

## 2012-05-23 DIAGNOSIS — D166 Benign neoplasm of vertebral column: Secondary | ICD-10-CM | POA: Diagnosis not present

## 2012-05-23 DIAGNOSIS — C3490 Malignant neoplasm of unspecified part of unspecified bronchus or lung: Secondary | ICD-10-CM

## 2012-05-23 DIAGNOSIS — E119 Type 2 diabetes mellitus without complications: Secondary | ICD-10-CM | POA: Insufficient documentation

## 2012-05-23 DIAGNOSIS — I509 Heart failure, unspecified: Secondary | ICD-10-CM | POA: Insufficient documentation

## 2012-05-23 DIAGNOSIS — J4489 Other specified chronic obstructive pulmonary disease: Secondary | ICD-10-CM | POA: Insufficient documentation

## 2012-05-23 DIAGNOSIS — J449 Chronic obstructive pulmonary disease, unspecified: Secondary | ICD-10-CM | POA: Insufficient documentation

## 2012-05-23 DIAGNOSIS — I10 Essential (primary) hypertension: Secondary | ICD-10-CM | POA: Diagnosis not present

## 2012-05-23 LAB — CBC WITH DIFFERENTIAL/PLATELET
Basophils Absolute: 0 10*3/uL (ref 0.0–0.1)
Basophils Relative: 0 % (ref 0–1)
HCT: 29.9 % — ABNORMAL LOW (ref 36.0–46.0)
Hemoglobin: 10.4 g/dL — ABNORMAL LOW (ref 12.0–15.0)
Lymphocytes Relative: 18 % (ref 12–46)
MCHC: 34.8 g/dL (ref 30.0–36.0)
Monocytes Absolute: 0.8 10*3/uL (ref 0.1–1.0)
Monocytes Relative: 8 % (ref 3–12)
Neutro Abs: 7.4 10*3/uL (ref 1.7–7.7)
Neutrophils Relative %: 73 % (ref 43–77)
RDW: 12.2 % (ref 11.5–15.5)
WBC: 10.1 10*3/uL (ref 4.0–10.5)

## 2012-05-23 LAB — BASIC METABOLIC PANEL
Chloride: 98 mEq/L (ref 96–112)
GFR calc Af Amer: 69 mL/min — ABNORMAL LOW (ref >90)
GFR calc non Af Amer: 59 mL/min — ABNORMAL LOW (ref >90)
Potassium: 3.4 mEq/L — ABNORMAL LOW (ref 3.5–5.1)

## 2012-05-23 LAB — PROTIME-INR: INR: 0.94 (ref 0.00–1.49)

## 2012-05-23 LAB — APTT: aPTT: 31 seconds (ref 24–37)

## 2012-05-23 MED ORDER — TOBRAMYCIN SULFATE 1.2 G IJ SOLR
INTRAMUSCULAR | Status: AC
  Filled 2012-05-23: qty 1.2

## 2012-05-23 MED ORDER — VANCOMYCIN HCL IN DEXTROSE 1-5 GM/200ML-% IV SOLN
1000.0000 mg | Freq: Once | INTRAVENOUS | Status: AC
  Administered 2012-05-23: 1000 mg via INTRAVENOUS
  Filled 2012-05-23: qty 200

## 2012-05-23 MED ORDER — MIDAZOLAM HCL 2 MG/2ML IJ SOLN
INTRAMUSCULAR | Status: AC
  Filled 2012-05-23: qty 6

## 2012-05-23 MED ORDER — FENTANYL CITRATE 0.05 MG/ML IJ SOLN
INTRAMUSCULAR | Status: AC
  Filled 2012-05-23: qty 4

## 2012-05-23 MED ORDER — SODIUM CHLORIDE 0.9 % IV SOLN: INTRAVENOUS | Status: DC

## 2012-05-23 MED ORDER — HYDROMORPHONE HCL PF 1 MG/ML IJ SOLN
INTRAMUSCULAR | Status: AC
  Filled 2012-05-23: qty 2

## 2012-05-23 MED ORDER — MIDAZOLAM HCL 2 MG/2ML IJ SOLN
INTRAMUSCULAR | Status: AC | PRN
  Administered 2012-05-23 (×2): 1 mg via INTRAVENOUS

## 2012-05-23 MED ORDER — SODIUM CHLORIDE 0.9 % IV SOLN: INTRAVENOUS | Status: AC

## 2012-05-23 MED ORDER — FENTANYL CITRATE 0.05 MG/ML IJ SOLN
INTRAMUSCULAR | Status: AC | PRN
  Administered 2012-05-23 (×2): 25 ug via INTRAVENOUS

## 2012-05-23 NOTE — Procedures (Signed)
S/P VP at T4 with biopsy

## 2012-05-23 NOTE — H&P (Signed)
[name] is an 67 y.o. female.   Chief Complaint: Back pain Hx lung Cancer MRI reveals Thoracic #4 fracture Scheduled for T4 kyphoplasty with biopsy HPI: DM; lung ca; RLS; HTN; HLD; hypoxia; CAD; PVD; COPD; Scoliosis; Fibromyalgia; CHF  Past Medical History  Diagnosis Date  . 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 Restless legs syndrome (RLS)   . Chronic sinusitis   . Hypertension   . Hyperlipidemia   . Anemia, unspecified   . GERD (gastroesophageal reflux disease)   . Respiratory failure with hypoxia 01/02/2010  . Complication of anesthesia     slow to wake up  . Asthma   . Coronary artery disease     Dr. Excell Seltzer had stress test done  . Type II or unspecified type diabetes mellitus without mention of complication, not stated as uncontrolled   . Peripheral vascular disease   . PAD (peripheral artery disease)   . Heart murmur   . Dysrhythmia     irregular  . Anginal pain   . History of blood clots     "2 in my aorta"  . Hard of hearing, right   . History of bronchitis   . Bronchial pneumonia     history  . COPD (chronic obstructive pulmonary disease)   . Emphysema   . Shortness of breath     "anytime really"  . Syncope and collapse   . Adenocarcinoma, lung     upper left lobe  . 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 Chemotherapy adverse reaction 09/15/11    "makes me light headed and pass out; this is 3rd blood transfusion since going thru it"  . History of blood transfusion   . Kidney infection     "related to chemo"  . Sinus headache     "all the time"  . Arthritis     "all over my body"  . Fibromyalgia   . Osteoporosis   . DDD (degenerative disc disease), lumbar   . Scoliosis   . Anxiety   . Mental disorder   . Reflux   . CHF (congestive heart failure)   . DVT (deep venous thrombosis)   . Hx of radiation therapy 04/28/11 -06/08/11    right upper lobe-lung    Past Surgical History  Procedure Laterality Date   . Lung lobectomy  1986    left  . Septoplasty    . Total hip arthroplasty  01/2010    left  . Bronchoscopy  02/2011  . Dilation and curettage of uterus  1980's  . Tubal ligation  1980's  . Cardiac catheterization    . Fetal blood transfusion  09/16/11  . Joint replacement  04/13/10    Left Hip    Family History  Problem Relation Age of Onset  . Bone cancer Father   . Cancer Father   . Diabetes Mother   . Deep vein thrombosis Mother   . Heart disease Mother     Heart Disease before age 38  . Hyperlipidemia Mother   . Hypertension Mother    Social History:  reports that she quit smoking about 15 years ago. Her smoking use included Cigarettes. She has a 60 pack-year smoking history. She has never used smokeless tobacco. She reports that  drinks alcohol. She reports that she uses illicit drugs (Hydrocodone).  Allergies:  Allergies  Allergen Reactions  . Iohexol Shortness Of Breath and Other (See Comments)    "burns me up  inside"    Pt does fine with 13 hour prep  01/17/12  . Other     CAN'T STAND THE SMELL OF PURE BLEACH; "HAVE TO BACK UP AND POUR AND DON'T BREATH IT TIL i GET CAP BACK ON; DILUTE IT TO SPRAY"  . Penicillins Itching and Other (See Comments)    "scratchy; makes red marks on me, might be from my scratching"  . Codeine Itching and Nausea And Vomiting    REACTION: GI upset     (Not in a hospital admission)  No results found for this or any previous visit (from the past 48 hour(s)). No results found.  Review of Systems  Constitutional: Negative for fever and weight loss.  Respiratory: Positive for cough.   Gastrointestinal: Negative for nausea and vomiting.  Musculoskeletal: Positive for back pain.  Neurological: Negative for headaches.    There were no vitals taken for this visit. Physical Exam  Cardiovascular: Normal rate, regular rhythm and normal heart sounds.   No murmur heard. Respiratory: Effort normal. She has wheezes.  GI: Soft. Bowel sounds are  normal. There is tenderness.  Musculoskeletal: Normal range of motion.  Neurological: She is alert.  Psychiatric: She has a normal mood and affect. Her behavior is normal. Judgment and thought content normal.     Assessment/Plan Back pain MRI shows T4 fx Consulted with Dr Corliss Skains Scheduled now for T4 KP and biopsy Pt aware of procedure benefits and risks and agreeable to proceed Consent signed and in chart  Makynzi Eastland A 05/23/2012, 10:44 AM

## 2012-05-24 ENCOUNTER — Telehealth (HOSPITAL_COMMUNITY): Payer: Self-pay | Admitting: Interventional Radiology

## 2012-05-24 ENCOUNTER — Other Ambulatory Visit (HOSPITAL_COMMUNITY): Payer: Self-pay | Admitting: Interventional Radiology

## 2012-05-24 DIAGNOSIS — IMO0002 Reserved for concepts with insufficient information to code with codable children: Secondary | ICD-10-CM

## 2012-05-31 ENCOUNTER — Other Ambulatory Visit: Payer: Self-pay | Admitting: *Deleted

## 2012-05-31 MED ORDER — DILTIAZEM HCL ER 240 MG PO CP24: 240.0000 mg | ORAL_CAPSULE | Freq: Every day | ORAL | Status: DC

## 2012-06-08 ENCOUNTER — Telehealth: Payer: Self-pay | Admitting: *Deleted

## 2012-06-08 NOTE — Telephone Encounter (Signed)
Per Dr Michell Heinrich called pt to reschedule her FU she cancelled in January. Pt states she "has so many drs appointments and doesn't want to reschedule at this time".  She states she will call if she wants to come back in. Enc pt to call as needed.

## 2012-06-13 ENCOUNTER — Ambulatory Visit (HOSPITAL_COMMUNITY): Admission: RE | Admit: 2012-06-13 | Payer: Medicare Other | Source: Ambulatory Visit

## 2012-06-23 ENCOUNTER — Ambulatory Visit (HOSPITAL_COMMUNITY): Admission: RE | Admit: 2012-06-23 | Payer: Medicare Other | Source: Ambulatory Visit

## 2012-07-18 ENCOUNTER — Other Ambulatory Visit: Payer: Self-pay | Admitting: *Deleted

## 2012-07-18 DIAGNOSIS — C3490 Malignant neoplasm of unspecified part of unspecified bronchus or lung: Secondary | ICD-10-CM

## 2012-07-18 MED ORDER — PREDNISONE 50 MG PO TABS: ORAL_TABLET | ORAL | Status: DC

## 2012-07-18 MED ORDER — DIPHENHYDRAMINE HCL 50 MG PO TABS: 50.0000 mg | ORAL_TABLET | ORAL | Status: DC

## 2012-07-19 ENCOUNTER — Other Ambulatory Visit: Payer: Self-pay | Admitting: *Deleted

## 2012-07-21 ENCOUNTER — Other Ambulatory Visit (HOSPITAL_BASED_OUTPATIENT_CLINIC_OR_DEPARTMENT_OTHER): Payer: Medicare Other | Admitting: Lab

## 2012-07-21 ENCOUNTER — Ambulatory Visit (HOSPITAL_COMMUNITY)
Admission: RE | Admit: 2012-07-21 | Discharge: 2012-07-21 | Disposition: A | Discharge status: Home / Self Care | Payer: Medicare Other | Source: Ambulatory Visit | Attending: Internal Medicine | Admitting: Internal Medicine

## 2012-07-21 DIAGNOSIS — C3490 Malignant neoplasm of unspecified part of unspecified bronchus or lung: Secondary | ICD-10-CM | POA: Diagnosis not present

## 2012-07-21 DIAGNOSIS — C3410 Malignant neoplasm of upper lobe, unspecified bronchus or lung: Secondary | ICD-10-CM

## 2012-07-21 DIAGNOSIS — R0602 Shortness of breath: Secondary | ICD-10-CM | POA: Diagnosis not present

## 2012-07-21 DIAGNOSIS — E041 Nontoxic single thyroid nodule: Secondary | ICD-10-CM | POA: Diagnosis not present

## 2012-07-21 DIAGNOSIS — R05 Cough: Secondary | ICD-10-CM | POA: Insufficient documentation

## 2012-07-21 DIAGNOSIS — E279 Disorder of adrenal gland, unspecified: Secondary | ICD-10-CM | POA: Insufficient documentation

## 2012-07-21 DIAGNOSIS — I700 Atherosclerosis of aorta: Secondary | ICD-10-CM | POA: Insufficient documentation

## 2012-07-21 DIAGNOSIS — Z902 Acquired absence of lung [part of]: Secondary | ICD-10-CM | POA: Insufficient documentation

## 2012-07-21 DIAGNOSIS — M8448XA Pathological fracture, other site, initial encounter for fracture: Secondary | ICD-10-CM | POA: Diagnosis not present

## 2012-07-21 DIAGNOSIS — R059 Cough, unspecified: Secondary | ICD-10-CM | POA: Insufficient documentation

## 2012-07-21 DIAGNOSIS — K8020 Calculus of gallbladder without cholecystitis without obstruction: Secondary | ICD-10-CM | POA: Diagnosis not present

## 2012-07-21 DIAGNOSIS — I2510 Atherosclerotic heart disease of native coronary artery without angina pectoris: Secondary | ICD-10-CM | POA: Diagnosis not present

## 2012-07-21 DIAGNOSIS — Z9221 Personal history of antineoplastic chemotherapy: Secondary | ICD-10-CM | POA: Insufficient documentation

## 2012-07-21 DIAGNOSIS — J438 Other emphysema: Secondary | ICD-10-CM | POA: Diagnosis not present

## 2012-07-21 LAB — CBC WITH DIFFERENTIAL/PLATELET
BASO%: 0.1 % (ref 0.0–2.0)
HCT: 31.7 % — ABNORMAL LOW (ref 34.8–46.6)
MCHC: 34.5 g/dL (ref 31.5–36.0)
MONO#: 0.1 10*3/uL (ref 0.1–0.9)
RBC: 3.37 10*6/uL — ABNORMAL LOW (ref 3.70–5.45)
WBC: 9.6 10*3/uL (ref 3.9–10.3)
lymph#: 0.8 10*3/uL — ABNORMAL LOW (ref 0.9–3.3)

## 2012-07-21 LAB — COMPREHENSIVE METABOLIC PANEL (CC13)
ALT: 13 U/L (ref 0–55)
CO2: 25 mEq/L (ref 22–29)
Calcium: 10.5 mg/dL — ABNORMAL HIGH (ref 8.4–10.4)
Chloride: 99 mEq/L (ref 98–107)
Potassium: 4 mEq/L (ref 3.5–5.1)
Sodium: 140 mEq/L (ref 136–145)
Total Protein: 7.7 g/dL (ref 6.4–8.3)

## 2012-07-21 MED ORDER — IOHEXOL 300 MG/ML  SOLN
80.0000 mL | Freq: Once | INTRAMUSCULAR | Status: AC | PRN
  Administered 2012-07-21: 80 mL via INTRAVENOUS

## 2012-07-25 ENCOUNTER — Encounter: Payer: Self-pay | Admitting: Internal Medicine

## 2012-07-25 ENCOUNTER — Ambulatory Visit (HOSPITAL_BASED_OUTPATIENT_CLINIC_OR_DEPARTMENT_OTHER): Payer: Medicare Other | Admitting: Internal Medicine

## 2012-07-25 ENCOUNTER — Telehealth: Payer: Self-pay | Admitting: Internal Medicine

## 2012-07-25 ENCOUNTER — Other Ambulatory Visit: Payer: Self-pay | Admitting: Geriatric Medicine

## 2012-07-25 VITALS — BP 145/41 | HR 108 | Temp 97.8°F | Resp 20 | Ht 66.0 in | Wt 174.5 lb

## 2012-07-25 DIAGNOSIS — R0602 Shortness of breath: Secondary | ICD-10-CM

## 2012-07-25 DIAGNOSIS — J449 Chronic obstructive pulmonary disease, unspecified: Secondary | ICD-10-CM | POA: Diagnosis not present

## 2012-07-25 DIAGNOSIS — C3491 Malignant neoplasm of unspecified part of right bronchus or lung: Secondary | ICD-10-CM

## 2012-07-25 DIAGNOSIS — C3410 Malignant neoplasm of upper lobe, unspecified bronchus or lung: Secondary | ICD-10-CM

## 2012-07-25 DIAGNOSIS — M549 Dorsalgia, unspecified: Secondary | ICD-10-CM | POA: Diagnosis not present

## 2012-07-25 MED ORDER — CITALOPRAM HYDROBROMIDE 40 MG PO TABS: 40.0000 mg | ORAL_TABLET | Freq: Every day | ORAL | Status: DC

## 2012-07-25 NOTE — Telephone Encounter (Signed)
gv and printed appt schedule and avs for pt....pt aware cs will contact with d/t of ct

## 2012-07-25 NOTE — Patient Instructions (Signed)
No evidence for disease progression on the recent scan. Followup in 6 months with repeat CT scan of the chest.

## 2012-07-25 NOTE — Progress Notes (Signed)
[facility] Telephone:([phone]) [phone]   Fax:([phone]) [phone]  OFFICE PROGRESS NOTE  [name]., MD [address] [place] [address]  DIAGNOSIS: Stage IIB/IIIA non-small cell lung cancer consistent with adenocarcinoma diagnosed in December 2012.   PRIOR THERAPY:  1) Status post radiation therapy to the right upper lobe lung mass completed on 06/04/2011 under the care of Dr. Michell Heinrich.  2) Systemic chemotherapy with carboplatin for AUC of 5 and Alimta 500 mg/M2 giving every 3 weeks, status post 3 cycles, last dose was given on 07/21/2011.   CURRENT THERAPY: Observation.  INTERVAL HISTORY: [name] 67 y.o. female returns to the clinic today for routine followup visit accompanied her daughter. The patient is feeling fine today with no specific complaints except for the baseline shortness breath and back pain. She underwent vertebroplasty under the care of Dr. Corliss Skains and biopsy of the T4 compression fraction was unremarkable for any malignancy. She denied having any significant chest pain or hemoptysis. She denied having any significant weight loss or night sweats. The patient had repeat CT scan of the chest performed recently and she is here today for evaluation and discussion of her scan results.  MEDICAL HISTORY: Past Medical History  Diagnosis Date  . 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 Restless legs syndrome (RLS)   . Chronic sinusitis   . Hypertension   . Hyperlipidemia   . Anemia, unspecified   . GERD (gastroesophageal reflux disease)   . Respiratory failure with hypoxia 01/02/2010  . Complication of anesthesia     slow to wake up  . Asthma   . Coronary artery disease     Dr. Excell Seltzer had stress test done  . Type II or unspecified type diabetes mellitus without mention of complication, not stated as uncontrolled   . Peripheral vascular disease   . PAD (peripheral artery disease)   . Heart murmur   . Dysrhythmia     irregular  . Anginal  pain   . History of blood clots     "2 in my aorta"  . Hard of hearing, right   . History of bronchitis   . Bronchial pneumonia     history  . COPD (chronic obstructive pulmonary disease)   . Emphysema   . Shortness of breath     "anytime really"  . Syncope and collapse   . Adenocarcinoma, lung     upper left lobe  . 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 Chemotherapy adverse reaction 09/15/11    "makes me light headed and pass out; this is 3rd blood transfusion since going thru it"  . History of blood transfusion   . Kidney infection     "related to chemo"  . Sinus headache     "all the time"  . Arthritis     "all over my body"  . Fibromyalgia   . Osteoporosis   . DDD (degenerative disc disease), lumbar   . Scoliosis   . Anxiety   . Mental disorder   . Reflux   . CHF (congestive heart failure)   . DVT (deep venous thrombosis)   . Hx of radiation therapy 04/28/11 -06/08/11    right upper lobe-lung    ALLERGIES:  is allergic to iohexol; other; penicillins; and codeine.  MEDICATIONS:  Current Outpatient Prescriptions  Medication Sig Dispense Refill  . albuterol-ipratropium (COMBIVENT) 18-103 MCG/ACT inhaler Inhale 2 puffs into the lungs every 6 (six)  hours as needed. For shortness of breath      . ALPRAZolam (XANAX) 1 MG tablet Take 1 mg by mouth 3 (three) times daily as needed. For anxiety      . aspirin EC 325 MG tablet Take 325 mg by mouth daily.      . Calcium Citrate-Vitamin D (CITRACAL + D PO) Take 2 tablets by mouth daily.      . citalopram (CELEXA) 40 MG tablet Take 40 mg by mouth daily.      . cyclobenzaprine (FLEXERIL) 5 MG tablet Take 5 mg by mouth 3 (three) times daily as needed. For muscle spasms      . diltiazem (DILACOR XR) 240 MG 24 hr capsule Take 1 capsule (240 mg total) by mouth daily.  30 capsule  6  . FeFum-FePoly-FA-B Cmp-C-Biot (INTEGRA PLUS PO) Take 1 tablet by mouth daily.      Marland Kitchen gemfibrozil (LOPID) 600 MG  tablet Take 600 mg by mouth daily.       Marland Kitchen HYDROcodone-acetaminophen (VICODIN ES) 7.5-750 MG per tablet Take 1 tablet by mouth every 6 (six) hours as needed. For severe pain      . Lactobacillus (ACIDOPHILUS PO) Take 1 tablet by mouth daily.      Marland Kitchen lidocaine (LIDODERM) 5 % Place 1 patch onto the skin daily as needed. For shoulder pain.Remove & Discard patch within 12 hours or as directed by MD.      . loratadine (CLARITIN) 10 MG tablet Take 1 tablet (10 mg total) by mouth daily.  30 tablet  1  . losartan (COZAAR) 50 MG tablet Take 1 tablet by mouth 2 (two) times daily.      . mometasone (NASONEX) 50 MCG/ACT nasal spray Place 2 sprays into the nose 2 (two) times daily.  17 g  1  . Multiple Vitamins-Minerals (CENTRUM SILVER PO) Take 1 tablet by mouth daily.      . potassium chloride SA (K-DUR,KLOR-CON) 20 MEQ tablet Take 1 tablet (20 mEq total) by mouth 2 (two) times daily.  60 tablet  6  . senna (SENOKOT) 8.6 MG TABS Take 1 tablet by mouth at bedtime and may repeat dose one time if needed. For constipation.      . Simethicone (GAS-X EXTRA STRENGTH PO) Take 1 tablet by mouth at bedtime as needed. As needed for gas.      . traMADol (ULTRAM) 50 MG tablet Take 50 mg by mouth every 4 (four) hours as needed. For moderate pain      . triamcinolone ointment (KENALOG) 0.1 % Apply 1 application topically 2 (two) times daily as needed. As needed for irritated lips.      . triamterene-hydrochlorothiazide (MAXZIDE-25) 37.5-25 MG per tablet Take 1 tablet by mouth daily.      . diphenhydrAMINE (BENADRYL) 50 MG tablet Take 1 tablet (50 mg total) by mouth as directed. 1 hour before CT scan  1 tablet  0  . predniSONE (DELTASONE) 50 MG tablet 50mg  13 hours, 7 hours, and 1 hour before CT scan.  3 tablet  0  . [DISCONTINUED] calcium carbonate 200 MG capsule Take 250 mg by mouth daily. Does not take everyday       No current facility-administered medications for this visit.   Facility-Administered Medications Ordered  in Other Visits  Medication Dose Route Frequency Provider Last Rate Last Dose  . hyaluronate sodium (RADIAPLEXRX) gel   Topical Once Lurline Hare, MD      . topical emolient (BIAFINE) emulsion  Topical PRN Lurline Hare, MD        SURGICAL HISTORY:  Past Surgical History  Procedure Laterality Date  . Lung lobectomy  1986    left  . Septoplasty    . Total hip arthroplasty  01/2010    left  . Bronchoscopy  02/2011  . Dilation and curettage of uterus  1980's  . Tubal ligation  1980's  . Cardiac catheterization    . Fetal blood transfusion  09/16/11  . Joint replacement  04/13/10    Left Hip    REVIEW OF SYSTEMS:  A comprehensive review of systems was negative except for: Constitutional: positive for fatigue Respiratory: positive for cough and dyspnea on exertion Musculoskeletal: positive for back pain   PHYSICAL EXAMINATION: General appearance: alert, cooperative and no distress Head: Normocephalic, without obvious abnormality, atraumatic Neck: no adenopathy Resp: clear to auscultation bilaterally Cardio: regular rate and rhythm, S1, S2 normal, no murmur, click, rub or gallop GI: soft, non-tender; bowel sounds normal; no masses,  no organomegaly Extremities: extremities normal, atraumatic, no cyanosis or edema  ECOG PERFORMANCE STATUS: 1 - Symptomatic but completely ambulatory  Blood pressure 145/41, pulse 108, temperature 97.8 F (36.6 C), temperature source Oral, resp. rate 20, height 5\' 6"  (1.676 m), weight 174 lb 8 oz (79.153 kg).  LABORATORY DATA: Lab Results  Component Value Date   WBC 9.6 07/21/2012   HGB 10.9* 07/21/2012   HCT 31.7* 07/21/2012   MCV 94.1 07/21/2012   PLT 276 07/21/2012      Chemistry      Component Value Date/Time   NA 140 07/21/2012 1125   NA 138 05/23/2012 1009   NA 142 10/25/2011 1200   K 4.0 07/21/2012 1125   K 3.4* 05/23/2012 1009   K 4.6 10/25/2011 1200   CL 99 07/21/2012 1125   CL 98 05/23/2012 1009   CL 97* 10/25/2011 1200   CO2 25  07/21/2012 1125   CO2 29 05/23/2012 1009   CO2 24 10/25/2011 1200   BUN 22.9 07/21/2012 1125   BUN 22 05/23/2012 1009   BUN 15 10/25/2011 1200   CREATININE 1.2* 07/21/2012 1125   CREATININE 0.96 05/23/2012 1009   CREATININE 0.8 10/25/2011 1200      Component Value Date/Time   CALCIUM 10.5* 07/21/2012 1125   CALCIUM 9.9 05/23/2012 1009   CALCIUM 9.6 10/25/2011 1200   ALKPHOS 136 07/21/2012 1125   ALKPHOS 106* 10/25/2011 1200   ALKPHOS 64 08/15/2011 0512   AST 18 07/21/2012 1125   AST 25 10/25/2011 1200   AST 18 08/15/2011 0512   ALT 13 07/21/2012 1125   ALT 13 08/15/2011 0512   BILITOT 0.36 07/21/2012 1125   BILITOT 0.60 10/25/2011 1200   BILITOT 0.3 08/15/2011 0512       RADIOGRAPHIC STUDIES: Ct Chest W Contrast  07/21/2012  *RADIOLOGY REPORT*  Clinical Data: Follow up bilateral lung cancer, chemotherapy completed 11/2011, cough, shortness of breath  CT CHEST WITH CONTRAST  Technique:  Multidetector CT imaging of the chest was performed following the standard protocol during bolus administration of intravenous contrast.  Contrast: 80mL OMNIPAQUE IOHEXOL 300 MG/ML  SOLN  Comparison: CT chest dated 04/24/2012. MRI thoracic spine dated 05/03/2012.  Findings: Prior left upper lobectomy.  Stable posterior right upper lobe/paramediastinal lung consolidation (series 2/image 22), likely reflecting radiation changes. Additional radiation changes in the posterior left upper lung (series 5/image 21).  No suspicious pulmonary nodules.  Underlying moderate centrilobular and paraseptal emphysematous changes. No pleural effusion or pneumothorax.  Small left thyroid nodule (series 2/image 8), unchanged.  The heart is normal in size.  Trace pericardial fluid.  Coronary atherosclerosis.  Atherosclerotic calcifications of the aortic arch.  No suspicious mediastinal, hilar, or axillary lymphadenopathy.  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  Cholelithiasis, without associated inflammatory changes.  Mild compression deformity at T4, corresponding to pathologic fracture on MRI, with interval vertebral augmentation.  Early/mild superior endplate changes at T5 (sagittal image 58).  IMPRESSION: Prior left upper lobectomy.  Stable right upper lobe/paramediastinal radiation changes.  Stable radiation changes in the posterior left upper lung.  Mild compression deformity at T4, corresponding to known pathologic fracture, with associated vertebral augmentation.  No evidence of new/progressive metastatic disease in the chest.   Original Report Authenticated By: Charline Bills, M.D.     ASSESSMENT: This is a very pleasant 67 years old white female with history of stage IIIA non-small cell lung cancer, status post palliative radiotherapy to the right upper lobe lung mass followed by systemic chemotherapy with carboplatin and Alimta and has been observation since April 2013 with no evidence for disease progression.   PLAN: I discussed the scan results with the patient and her daughter. I recommended for her to continue on observation with repeat CT scan of the chest in 6 months.  For the shortness of breath, the patient would see her pulmonologist Dr. Delton Coombes for evaluation and management of COPD. She was advised to call immediately if she has any concerning symptoms in the interval the All questions were answered. The patient knows to call the clinic with any problems, questions or concerns. We can certainly see the patient much sooner if necessary.

## 2012-07-26 ENCOUNTER — Encounter: Payer: Self-pay | Admitting: Internal Medicine

## 2012-07-26 ENCOUNTER — Other Ambulatory Visit: Payer: Self-pay | Admitting: *Deleted

## 2012-07-26 ENCOUNTER — Ambulatory Visit (INDEPENDENT_AMBULATORY_CARE_PROVIDER_SITE_OTHER): Payer: Medicare Other | Admitting: Internal Medicine

## 2012-07-26 VITALS — BP 132/58 | HR 88 | Temp 97.9°F | Resp 98 | Ht 65.5 in | Wt 175.8 lb

## 2012-07-26 DIAGNOSIS — J3489 Other specified disorders of nose and nasal sinuses: Secondary | ICD-10-CM

## 2012-07-26 DIAGNOSIS — I498 Other specified cardiac arrhythmias: Secondary | ICD-10-CM

## 2012-07-26 DIAGNOSIS — F411 Generalized anxiety disorder: Secondary | ICD-10-CM | POA: Diagnosis not present

## 2012-07-26 DIAGNOSIS — R0981 Nasal congestion: Secondary | ICD-10-CM

## 2012-07-26 DIAGNOSIS — C3491 Malignant neoplasm of unspecified part of right bronchus or lung: Secondary | ICD-10-CM

## 2012-07-26 DIAGNOSIS — I10 Essential (primary) hypertension: Secondary | ICD-10-CM

## 2012-07-26 DIAGNOSIS — G894 Chronic pain syndrome: Secondary | ICD-10-CM | POA: Diagnosis not present

## 2012-07-26 MED ORDER — ALPRAZOLAM 1 MG PO TABS: ORAL_TABLET | ORAL | Status: DC

## 2012-07-26 MED ORDER — CITALOPRAM HYDROBROMIDE 40 MG PO TABS: ORAL_TABLET | ORAL | Status: DC

## 2012-07-26 MED ORDER — DILTIAZEM HCL ER 240 MG PO CP24: ORAL_CAPSULE | ORAL | Status: DC

## 2012-07-26 MED ORDER — LOSARTAN POTASSIUM 50 MG PO TABS: ORAL_TABLET | ORAL | Status: DC

## 2012-07-26 MED ORDER — TRIAMTERENE-HCTZ 37.5-25 MG PO TABS: ORAL_TABLET | ORAL | Status: DC

## 2012-07-26 MED ORDER — LOSARTAN POTASSIUM 50 MG PO TABS: 50.0000 mg | ORAL_TABLET | Freq: Two times a day (BID) | ORAL | Status: DC

## 2012-07-26 MED ORDER — GEMFIBROZIL 600 MG PO TABS: ORAL_TABLET | ORAL | Status: DC

## 2012-07-26 MED ORDER — IPRATROPIUM-ALBUTEROL 18-103 MCG/ACT IN AERO: INHALATION_SPRAY | RESPIRATORY_TRACT | Status: DC

## 2012-07-26 NOTE — Patient Instructions (Signed)
Check bp daily and notify office in 1 week about the readings

## 2012-07-26 NOTE — Progress Notes (Signed)
  Subjective:    Patient ID: [name], female    DOB: [birth year], 67 y.o.   MRN: [id number]  HPI Patient here for her follow up visit. She has been feeling congested and has tried all OTC medication without much help. She feels dry in her nostrils. She is on continuous oxygen by nasal canula. She follows with pulmonary and heme-onc for her lung cancer and copd  Review of Systems  Constitutional: Negative for fever, chills and activity change.  HENT: Positive for congestion. Negative for ear pain, mouth sores, neck pain and sinus pressure.   Eyes: Negative for discharge and visual disturbance.  Respiratory: Positive for cough and shortness of breath.   Cardiovascular: Negative for chest pain, palpitations and leg swelling.  Gastrointestinal: Negative for abdominal pain and abdominal distention.  Musculoskeletal: Positive for back pain and arthralgias.  Neurological: Negative for dizziness and light-headedness.  Hematological: Negative for adenopathy.  Psychiatric/Behavioral: Negative for behavioral problems and agitation.      Objective:   Physical Exam  Constitutional: She is oriented to person, place, and time.  Elderly chronically ill apprearing patient  HENT:  Head: Normocephalic and atraumatic.  Right Ear: External ear normal.  Left Ear: External ear normal.  Nose: Nose normal.  Mouth/Throat: Oropharynx is clear and moist. No oropharyngeal exudate.  Eyes: Conjunctivae are normal. Pupils are equal, round, and reactive to light.  Neck: Normal range of motion. Neck supple.  Cardiovascular: Normal rate and regular rhythm.   Pulmonary/Chest: Effort normal and breath sounds normal.  Abdominal: Soft. Bowel sounds are normal.  Musculoskeletal: Normal range of motion. She exhibits no edema and no tenderness.  Lymphadenopathy:    She has no cervical adenopathy.  Neurological: She is alert and oriented to person, place, and time.  Skin: Skin is warm.  Psychiatric: She has a  normal mood and affect.   BP 132/58  Pulse 88  Temp(Src) 97.9 F (36.6 C)  Resp 98  Ht 5' 5.5" (1.664 m)  Wt 175 lb 12.8 oz (79.742 kg)  BMI 28.8 kg/m2     Assessment & Plan:  Nasal congestion- her oxygen and allergy with pollen could be contributing to this. Encouraged use of flonase and steam inhalation bid and to use humidifier and reassess  Dysrhythmia- controlled with diltiazem and asa for now, monitor  Chronic pain- managed by cancer centre. Continue current regimen, no changes made  HTN- bp well controlled, DBP on lower side. Pt to monitor bp at home and if bp remains low normal, will need to decrease bp medication. Currently continue maxzide and losartan with diltiazem for now. Continue asa and kcl supplement  Anxiety- in setting of copd and lung cancer, continue alprazolam for now, refill provided  Depression- stable with current regimen of celexa and xanax-- monitor

## 2012-08-02 DIAGNOSIS — M542 Cervicalgia: Secondary | ICD-10-CM | POA: Diagnosis not present

## 2012-08-02 DIAGNOSIS — M545 Low back pain: Secondary | ICD-10-CM | POA: Diagnosis not present

## 2012-08-02 DIAGNOSIS — M159 Polyosteoarthritis, unspecified: Secondary | ICD-10-CM | POA: Diagnosis not present

## 2012-08-24 ENCOUNTER — Other Ambulatory Visit: Payer: Self-pay | Admitting: Emergency Medicine

## 2012-08-24 MED ORDER — MOMETASONE FUROATE 50 MCG/ACT NA SUSP: 2.0000 | Freq: Two times a day (BID) | NASAL | Status: DC

## 2012-09-15 ENCOUNTER — Telehealth: Payer: Self-pay | Admitting: Emergency Medicine

## 2012-09-15 MED ORDER — CLINDAMYCIN HCL 300 MG PO CAPS: 300.0000 mg | ORAL_CAPSULE | Freq: Four times a day (QID) | ORAL | Status: DC

## 2012-09-15 NOTE — Telephone Encounter (Signed)
Called and spoke with pt and she stated that she has not been feeling good x 2 wks.  Pt stated that she went outside last night and felt weak and dizzy headed, sore throat, chills and sweats, cough with beige sputum at times and she stated that sometimes this has blood in it but she feels this is coming from her sinuses.  Pt is requesting that something be called into her pharmacy.  RB please advise. Thanks  Allergies  Allergen Reactions  . Iohexol Shortness Of Breath and Other (See Comments)    "burns me up inside"    Pt does fine with 13 hour prep  01/17/12  . Other     CAN'T STAND THE SMELL OF PURE BLEACH; "HAVE TO BACK UP AND POUR AND DON'T BREATH IT TIL i GET CAP BACK ON; DILUTE IT TO SPRAY"  . Penicillins Itching and Other (See Comments)    "scratchy; makes red marks on me, might be from my scratching"  . Codeine Itching and Nausea And Vomiting    REACTION: GI upset

## 2012-09-15 NOTE — Telephone Encounter (Signed)
It sounds like an acute sinusitis. She should try doing nasal saline washes daily if she can tolerate. Continue her loratadine and fluticasone nasal spray. Start clinda 300mg  qid for 10 days. Thanks

## 2012-09-15 NOTE — Telephone Encounter (Signed)
Pt is aware of RB recommendations. She verbalized understanding. Rx has been sent to [facility] [address] per the pt's request. Nothing further was needed.

## 2012-09-21 ENCOUNTER — Other Ambulatory Visit: Payer: Self-pay | Admitting: Emergency Medicine

## 2012-09-21 ENCOUNTER — Ambulatory Visit: Payer: Self-pay | Admitting: Vascular Surgery

## 2012-09-21 NOTE — Telephone Encounter (Signed)
Rx was already filled 08/24/12 17g with 6 refills.

## 2012-09-22 ENCOUNTER — Telehealth: Payer: Self-pay | Admitting: Emergency Medicine

## 2012-09-22 MED ORDER — LEVOFLOXACIN 750 MG PO TABS: 750.0000 mg | ORAL_TABLET | Freq: Every day | ORAL | Status: AC

## 2012-09-22 NOTE — Telephone Encounter (Signed)
Spoke with patient, patient states she is still not feeling well. States her head is stopped up, chest is sore and congested, One minute she is freezing one mintue she is burning up Sometimes she has a hacky cough which produces mucus of every color And then sometimes she has dry cough Patient states she feels light headed when going from sitting to standing position-- says she feels anemic with the fatigue and low energy Reports she is taking her antibiotic (Clindamycin 300 qid x 10 prescribed on 09/15/12), nasal saline washes and claritin Patient wants to know is there anything else she should be doing? Please advise Dr, Delton Coombes, thank you!  Last Ov: 05/16/12 with 1 month f/u never scheduled

## 2012-09-22 NOTE — Telephone Encounter (Signed)
Spoke with patient,  Informed her recs as listed below per RB Orders placed  Patient verbalized understanding and nothing further needed at this time

## 2012-09-22 NOTE — Telephone Encounter (Signed)
Have her stop clinda and start levaquin 750mg  daily x 7 days.  Try cough suppressant Delsym q12h Tell her that she should go be seen in urgent care if she worsens or feels sicker

## 2012-09-26 ENCOUNTER — Telehealth: Payer: Self-pay | Admitting: Emergency Medicine

## 2012-09-26 NOTE — Telephone Encounter (Signed)
I spoke with the pt and she states she is still taking levaquin and her cough and congestion seems to be improving. She states she still has productive cough but phlegm is clear now. Pt states she is still having a lot of fatigue and some dizziness when she stands. Pt has a BP machine at home and checked it while I was on the phone and it was 130/50.  I advised the pt that it will take a while for her to fully recover form illness given her history. I advised the it is normal to have increased fatigue for several weeks after illness. I advised her to drink lots of water, gt plenty of rest, finish abxs and to change positions slowly. I advised her that if the dizziness does not improve or gets worse ten to contact her PCP. Pt states understanding. Carron Curie, CMA

## 2012-09-28 ENCOUNTER — Other Ambulatory Visit: Payer: Self-pay | Admitting: *Deleted

## 2012-09-28 ENCOUNTER — Ambulatory Visit: Payer: Medicare HMO | Admitting: Vascular Surgery

## 2012-09-28 DIAGNOSIS — C3490 Malignant neoplasm of unspecified part of unspecified bronchus or lung: Secondary | ICD-10-CM

## 2012-09-28 MED ORDER — INTEGRA PLUS PO CAPS: 1.0000 | ORAL_CAPSULE | Freq: Every morning | ORAL | Status: DC

## 2012-09-29 ENCOUNTER — Other Ambulatory Visit: Payer: Self-pay | Admitting: *Deleted

## 2012-09-29 DIAGNOSIS — D649 Anemia, unspecified: Secondary | ICD-10-CM

## 2012-09-29 MED ORDER — FERROUS FUMARATE-FOLIC ACID 324-1 MG PO TABS: 1.0000 | ORAL_TABLET | Freq: Every day | ORAL | Status: DC

## 2012-09-29 NOTE — Telephone Encounter (Signed)
Received fax from [facility] that integra plus is on back-order.  Per Dr Donnald Garre, okay to order hemocyte.  SLJ

## 2012-10-03 ENCOUNTER — Encounter: Payer: Self-pay | Admitting: Internal Medicine

## 2012-10-03 ENCOUNTER — Ambulatory Visit (INDEPENDENT_AMBULATORY_CARE_PROVIDER_SITE_OTHER): Payer: Medicare Other | Admitting: Internal Medicine

## 2012-10-03 VITALS — BP 132/58 | HR 100 | Temp 97.4°F | Resp 20 | Ht 65.5 in | Wt 174.8 lb

## 2012-10-03 DIAGNOSIS — D649 Anemia, unspecified: Secondary | ICD-10-CM

## 2012-10-03 DIAGNOSIS — R1013 Epigastric pain: Secondary | ICD-10-CM | POA: Diagnosis not present

## 2012-10-03 DIAGNOSIS — K219 Gastro-esophageal reflux disease without esophagitis: Secondary | ICD-10-CM | POA: Diagnosis not present

## 2012-10-03 DIAGNOSIS — R5381 Other malaise: Secondary | ICD-10-CM | POA: Diagnosis not present

## 2012-10-03 DIAGNOSIS — R531 Weakness: Secondary | ICD-10-CM

## 2012-10-03 MED ORDER — OMEPRAZOLE 10 MG PO CPDR: 10.0000 mg | DELAYED_RELEASE_CAPSULE | Freq: Every day | ORAL | Status: DC

## 2012-10-03 NOTE — Progress Notes (Signed)
  Subjective:    Patient ID: [name], female    DOB: [birth year], 67 y.o.   MRN: [id number]  HPI  A month back ears started hurting , congestion in her head and chest and saw Dr Delton Coombes and was started on clindamycin and later switched to levofloxacin She was weak, had falls and was extremly tired. She was having problem with her vision too and now  it is resolved. She finished her antibiotic course of 2 weeks 3 days back. Her congestion has improved a whole lot. She has cough with clear phlegm now. She is now having problem with her belly. She is unable to belch and feels bloated. She is able to pass gas. Has loose stool once a day for now. Denies blood in stool, denies painful abdominal cramps She wants to move out of her house into a subsidized apartment and needs the form filled out She has iron deficiency anemia  And wants her iron level checked given her weakness Appetite is getting better Is keeping herself hydrated  Review of Systems  No fever or chills No nausea or vomiting No headaches or blurry vision No edema No urinary complaints     Objective:   Physical Exam  BP 132/58  Pulse 100  Temp(Src) 97.4 F (36.3 C)  Resp 20  Ht 5' 5.5" (1.664 m)  Wt 174 lb 12.8 oz (79.289 kg)  BMI 28.64 kg/m2  SpO2 98%  gen- elderly female patient in NAD, on o2 by nasal canula heent- no pallor, no icterus, no LAD, MMM cvs- normal s1, s2 respi- b/l CTA Ext- able to move all 4 abdo- epigastric discomfort on palpation, no guarding or rigidity      Assessment & Plan:   Weakness- has been recovering from recent infection and completed course of antibiotics. The debility from her infection could be contributing to this. Will check her h/h given her hx of anemia. Check cbc with diff to assess for infection  Epigastric discomfort- will have her on omeprazole 10 mg daily for now given her reflux, simethicone prn and check her lft and renal function. Encouraged hydration. Also  check h/h  HTN- BP well controlled. Continue current regimen  Filled out her form for apartment requirements to keep  Pet with her

## 2012-10-04 LAB — COMPREHENSIVE METABOLIC PANEL
Albumin/Globulin Ratio: 1.9 (ref 1.1–2.5)
Calcium: 10.3 mg/dL — ABNORMAL HIGH (ref 8.6–10.2)
Creatinine, Ser: 1.06 mg/dL — ABNORMAL HIGH (ref 0.57–1.00)
GFR calc non Af Amer: 54 mL/min/{1.73_m2} — ABNORMAL LOW (ref >59)
Globulin, Total: 2.1 g/dL (ref 1.5–4.5)
Glucose: 123 mg/dL — ABNORMAL HIGH (ref 65–99)
Total Protein: 6.1 g/dL (ref 6.0–8.5)

## 2012-10-04 LAB — CBC WITH DIFFERENTIAL/PLATELET
Basos: 0 % (ref 0–3)
Eos: 2 % (ref 0–5)
Immature Grans (Abs): 0 10*3/uL (ref 0.0–0.1)
Lymphs: 22 % (ref 14–46)
MCV: 91 fL (ref 79–97)
Neutrophils Relative %: 69 % (ref 40–74)
RBC: 2.95 x10E6/uL — ABNORMAL LOW (ref 3.77–5.28)
WBC: 8.1 10*3/uL (ref 3.4–10.8)

## 2012-10-11 ENCOUNTER — Telehealth: Payer: Self-pay | Admitting: *Deleted

## 2012-10-11 NOTE — Telephone Encounter (Signed)
Patient called and stated that the paperwork Dr Glade Lloyd filled out is in complete. Per Dr Glade Lloyd  It is okay to drop off. Patient has been notified

## 2012-10-12 ENCOUNTER — Encounter: Payer: Self-pay | Admitting: Cardiovascular Disease

## 2012-10-12 ENCOUNTER — Ambulatory Visit (INDEPENDENT_AMBULATORY_CARE_PROVIDER_SITE_OTHER): Payer: Medicare Other | Admitting: Cardiovascular Disease

## 2012-10-12 VITALS — BP 122/58 | HR 79 | Ht 65.5 in | Wt 173.1 lb

## 2012-10-12 DIAGNOSIS — I2510 Atherosclerotic heart disease of native coronary artery without angina pectoris: Secondary | ICD-10-CM

## 2012-10-12 NOTE — Progress Notes (Signed)
HPI:  67 year old woman presenting for followup of coronary artery disease. The patient has a history of nonobstructive disease. Her last heart catheterization was in 2006. This demonstrated nonobstructive nonobstructive plaque in the LAD with wide patency of the left circumflex and right coronary arteries. Left ventricular function has been within normal limits. She has peripheral arterial disease followed by Dr. Darrick Penna. She has chronic leg pain, primarily at night. She is not particularly limited by leg pain with ambulation. Her pain is in her thighs and calves. She also describes pain behind the knee when she stretches out. She's had no foot ulcerations. She's had chest pain at rest but nothing exertional. She has chronic shortness of breath and this is her primary limitation. She denies orthopnea or PND. Since her last visit here, she's been diagnosed with recurrent lung cancer. She's undergone radiation and chemotherapy. She's been followed closely at the cancer Center.  Outpatient Encounter Prescriptions as of 10/12/2012  Medication Sig Dispense Refill  . albuterol-ipratropium (COMBIVENT) 18-103 MCG/ACT inhaler 2 puffs every for hours for lung cancer  3 Inhaler  3  . ALPRAZolam (XANAX) 1 MG tablet Take one tablet three times a day for anxiety  90 tablet  3  . aspirin EC 325 MG tablet Take 325 mg by mouth daily.      . Calcium Citrate-Vitamin D (CITRACAL + D PO) Take 2 tablets by mouth daily.      . citalopram (CELEXA) 40 MG tablet Take one tablet once a day for depression at bedtime  90 tablet  3  . clindamycin (CLEOCIN) 300 MG capsule Take 300 mg by mouth 3 (three) times daily. Take 1 tablet four times a day      . cyclobenzaprine (FLEXERIL) 5 MG tablet Take 5 mg by mouth 3 (three) times daily as needed for muscle spasms. Take one tablet at bedtime if needed      . diltiazem (DILACOR XR) 240 MG 24 hr capsule Take one tablet once a day for blood pressure  90 capsule  3  . diphenhydrAMINE  (BENADRYL) 50 MG tablet Take 1 tablet (50 mg total) by mouth as directed. 1 hour before CT scan  1 tablet  0  . Ferrous Fumarate-Folic Acid (HEMOCYTE-F) 324-1 MG TABS Take 1 tablet by mouth daily.  30 each  0  . gemfibrozil (LOPID) 600 MG tablet Take 1 tablet 2 times  A day to help lower triglycerides and cholesterol  180 tablet  3  . HYDROcodone-acetaminophen (NORCO) 7.5-325 MG per tablet Take 1 tablet by mouth every 6 (six) hours as needed for pain. Take one tablet every 6 hours as needed for breakthrough pain      . lidocaine (LIDODERM) 5 % Place 1 patch onto the skin daily as needed. For shoulder pain.Remove & Discard patch within 12 hours or as directed by MD.      . loratadine (CLARITIN) 10 MG tablet Take 1 tablet (10 mg total) by mouth daily.  30 tablet  1  . losartan (COZAAR) 50 MG tablet Take one tablet twice a day for blood pressure  180 tablet  3  . Multiple Vitamins-Minerals (CENTRUM SILVER PO) Take 1 tablet by mouth daily.      Marland Kitchen omeprazole (PRILOSEC) 10 MG capsule Take 1 capsule (10 mg total) by mouth daily.  30 capsule  3  . potassium chloride (K-DUR) 10 MEQ tablet Take 10 mEq by mouth 2 (two) times daily. Take one tablet once a day for potassium supplement      .  senna (SENOKOT) 8.6 MG TABS Take 1 tablet by mouth at bedtime and may repeat dose one time if needed. For constipation.      . Simethicone (GAS-X EXTRA STRENGTH PO) Take 1 tablet by mouth at bedtime as needed. As needed for gas.      . traMADol (ULTRAM) 50 MG tablet Take 50 mg by mouth every 4 (four) hours as needed. For moderate pain      . triamcinolone ointment (KENALOG) 0.1 % Apply 1 application topically 2 (two) times daily as needed. As needed for irritated lips.      . triamterene-hydrochlorothiazide (MAXZIDE-25) 37.5-25 MG per tablet Take one tablet once a day  90 tablet  3   Facility-Administered Encounter Medications as of 10/12/2012  Medication Dose Route Frequency Provider Last Rate Last Dose  . hyaluronate sodium  (RADIAPLEXRX) gel   Topical Once Lurline Hare, MD      . topical emolient (BIAFINE) emulsion   Topical PRN Lurline Hare, MD        Allergies  Allergen Reactions  . Iohexol Shortness Of Breath and Other (See Comments)    "burns me up inside"    Pt does fine with 13 hour prep  01/17/12  . Other     CAN'T STAND THE SMELL OF PURE BLEACH; "HAVE TO BACK UP AND POUR AND DON'T BREATH IT TIL i GET CAP BACK ON; DILUTE IT TO SPRAY"  . Codeine Itching and Nausea And Vomiting    REACTION: GI upset    Past Medical History  Diagnosis Date  . 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 Restless legs syndrome (RLS)   . Chronic sinusitis   . Hypertension   . Hyperlipidemia   . Anemia, unspecified   . GERD (gastroesophageal reflux disease)   . Respiratory failure with hypoxia 01/02/2010  . Complication of anesthesia     slow to wake up  . Asthma   . Coronary artery disease     Dr. Excell Seltzer had stress test done  . Type II or unspecified type diabetes mellitus without mention of complication, not stated as uncontrolled   . Peripheral vascular disease   . PAD (peripheral artery disease)   . Heart murmur   . Dysrhythmia     irregular  . Anginal pain   . History of blood clots     "2 in my aorta"  . Hard of hearing, right   . History of bronchitis   . Bronchial pneumonia     history  . COPD (chronic obstructive pulmonary disease)   . Emphysema   . Shortness of breath     "anytime really"  . Syncope and collapse   . Adenocarcinoma, lung     upper left lobe  . 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 Chemotherapy adverse reaction 09/15/11    "makes me light headed and pass out; this is 3rd blood transfusion since going thru it"  . History of blood transfusion   . Kidney infection     "related to chemo"  . Sinus headache     "all the time"  . Arthritis     "all over my body"  . Fibromyalgia   . Osteoporosis   . DDD (degenerative disc disease), lumbar    . Scoliosis   . Anxiety   . Mental disorder   . Reflux   . CHF (congestive heart failure)   . DVT (deep venous thrombosis)   . Hx of  radiation therapy 04/28/11 -06/08/11    right upper lobe-lung  . Acute upper respiratory infections of unspecified site   . Pain in thoracic spine   . Asymptomatic varicose veins   . Functional urinary incontinence   . Diseases of lips   . Other abnormal blood chemistry   . Effects of radiation, unspecified   . Anemia in neoplastic disease   . Muscle weakness (generalized)   . Dizziness and giddiness   . Spasm of muscle   . Unspecified vitamin D deficiency   . Hyperpotassemia   . Acute posthemorrhagic anemia   . Unspecified hearing loss   . Disorder of bone and cartilage, unspecified   . Screening for thyroid disorder   . Malignant neoplasm of bronchus and lung, unspecified site   . Hypopotassemia   . Atherosclerosis of native arteries of the extremities, unspecified   . Reflux esophagitis   . Unspecified constipation   . Type II or unspecified type diabetes mellitus with neurological manifestations, uncontrolled(250.62)   . Unspecified hereditary and idiopathic peripheral neuropathy   . Urinary tract infection, site not specified   . Muscle weakness (generalized)   . Acute sinusitis, unspecified   . Herpes zoster without mention of complication   . 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 Type II or unspecified type diabetes mellitus without mention of complication, not stated as uncontrolled   . Other and unspecified hyperlipidemia   . Anxiety state, unspecified   . Depressive disorder, not elsewhere classified   . Unspecified essential hypertension   . Atrial fibrillation   . Chronic airway obstruction, not elsewhere classified   . Osteoarthrosis, unspecified whether generalized or localized, unspecified site   . Lumbago     ROS: Negative except as per HPI  BP 122/58  Pulse 79  Ht 5' 5.5" (1.664 m)  Wt 173 lb 1.9 oz (78.527 kg)   BMI 28.36 kg/m2  PHYSICAL EXAM: Pt is alert and oriented, NAD HEENT: normal Neck: JVP - normal, carotids 2+= without bruits Lungs: CTA bilaterally CV: RRR with grade 2/6 systolic ejection murmur at the left sternal border Abd: soft, NT, Positive BS, no hepatomegaly Ext: 1+ bilateral pretibial edema, posterior tibial pulses are 1+ bilaterally, DP pulses are faint. Skin: warm/dry no rash  EKG:  Normal sinus rhythm heart rate 79 beats per minute, RV conduction delay, otherwise within normal limits  ASSESSMENT AND PLAN: 1. Coronary artery disease, native vessel. The patient appears stable. She does not have clear symptoms of angina. She's had nonobstructive disease by cardiac catheterization. She's on appropriate medical program which I have reviewed. I'll see her back in one year.  2. Lower extremity PAD. She has an upcoming appointment with Dr. Darrick Penna as well as noninvasive vascular studies. Her leg pain is atypical.  Tonny Bollman 10/12/2012 2:11 PM

## 2012-10-12 NOTE — Patient Instructions (Signed)
Your physician wants you to follow-up in: 1 YEAR with Dr Cooper.  You will receive a reminder letter in the mail two months in advance. If you don't receive a letter, please call our office to schedule the follow-up appointment.  Your physician recommends that you continue on your current medications as directed. Please refer to the Current Medication list given to you today.  

## 2012-10-18 ENCOUNTER — Other Ambulatory Visit: Payer: Self-pay | Admitting: Internal Medicine

## 2012-10-18 ENCOUNTER — Other Ambulatory Visit: Payer: Self-pay | Admitting: Emergency Medicine

## 2012-10-25 ENCOUNTER — Encounter: Payer: Self-pay | Admitting: Vascular Surgery

## 2012-10-25 ENCOUNTER — Ambulatory Visit: Payer: Self-pay | Admitting: Internal Medicine

## 2012-10-26 ENCOUNTER — Ambulatory Visit (INDEPENDENT_AMBULATORY_CARE_PROVIDER_SITE_OTHER): Payer: Medicare Other | Admitting: Vascular Surgery

## 2012-10-26 ENCOUNTER — Encounter: Payer: Self-pay | Admitting: Vascular Surgery

## 2012-10-26 ENCOUNTER — Encounter (INDEPENDENT_AMBULATORY_CARE_PROVIDER_SITE_OTHER): Payer: Medicare Other | Admitting: *Deleted

## 2012-10-26 VITALS — BP 134/58 | HR 92 | Ht 65.5 in | Wt 173.3 lb

## 2012-10-26 DIAGNOSIS — I739 Peripheral vascular disease, unspecified: Secondary | ICD-10-CM | POA: Diagnosis not present

## 2012-10-26 DIAGNOSIS — I70219 Atherosclerosis of native arteries of extremities with intermittent claudication, unspecified extremity: Secondary | ICD-10-CM | POA: Diagnosis not present

## 2012-10-26 MED ORDER — GABAPENTIN 300 MG PO CAPS: 300.0000 mg | ORAL_CAPSULE | Freq: Every day | ORAL | Status: DC

## 2012-10-26 NOTE — Progress Notes (Signed)
Patient is a 67 year old female returns for followup today regarding peripheral arterial disease. She has been followed since 2011. She was originally seen and found to have mild to moderate bilateral peripheral arterial disease. She has a known left common femoral stenosis and a right common iliac stenosis. This was seen on prior CT Angio. She currently complains of numbness and tingling from the knee down to the foot bilaterally. She does not really describe claudication symptoms. However she is overall fairly inactive consistent recliner most of the day. She is on continuous home oxygen. She has now developed recurrent lung cancer and has recently had radiation and chemotherapy treatments. She also complains of some soreness behind both knees in the right groin occasionally. She is also noted that she bruises easy.  She denies any lower extremity swelling. She had a left total hip replacement several years ago. She does not describe rest pain in the feet. Chronic medical problems include hypertension, hyperlipidemia, coronary artery disease, lung cancer, diabetes, atrial fibrillation, COPD. These are all currently stable.  Past Medical History  Diagnosis Date  . 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 Restless legs syndrome (RLS)   . Chronic sinusitis   . Hypertension   . Hyperlipidemia   . Anemia, unspecified   . GERD (gastroesophageal reflux disease)   . Respiratory failure with hypoxia 01/02/2010  . Complication of anesthesia     slow to wake up  . Asthma   . Coronary artery disease     Dr. Excell Seltzer had stress test done  . Type II or unspecified type diabetes mellitus without mention of complication, not stated as uncontrolled   . Peripheral vascular disease   . PAD (peripheral artery disease)   . Heart murmur   . Dysrhythmia     irregular  . Anginal pain   . History of blood clots     "2 in my aorta"  . Hard of hearing, right   . History of bronchitis   . Bronchial pneumonia    history  . COPD (chronic obstructive pulmonary disease)   . Emphysema   . Shortness of breath     "anytime really"  . Syncope and collapse   . Adenocarcinoma, lung     upper left lobe  . 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 Chemotherapy adverse reaction 09/15/11    "makes me light headed and pass out; this is 3rd blood transfusion since going thru it"  . History of blood transfusion   . Kidney infection     "related to chemo"  . Sinus headache     "all the time"  . Arthritis     "all over my body"  . Fibromyalgia   . Osteoporosis   . DDD (degenerative disc disease), lumbar   . Scoliosis   . Anxiety   . Mental disorder   . Reflux   . CHF (congestive heart failure)   . DVT (deep venous thrombosis)   . Hx of radiation therapy 04/28/11 -06/08/11    right upper lobe-lung  . Acute upper respiratory infections of unspecified site   . Pain in thoracic spine   . Asymptomatic varicose veins   . Functional urinary incontinence   . Diseases of lips   . Other abnormal blood chemistry   . Effects of radiation, unspecified   . Anemia in neoplastic disease   . Muscle weakness (generalized)   . Dizziness and giddiness   . Spasm of muscle   .  Unspecified vitamin D deficiency   . Hyperpotassemia   . Acute posthemorrhagic anemia   . Unspecified hearing loss   . Disorder of bone and cartilage, unspecified   . Screening for thyroid disorder   . Malignant neoplasm of bronchus and lung, unspecified site   . Hypopotassemia   . Atherosclerosis of native arteries of the extremities, unspecified   . Reflux esophagitis   . Unspecified constipation   . Type II or unspecified type diabetes mellitus with neurological manifestations, uncontrolled(250.62)   . Unspecified hereditary and idiopathic peripheral neuropathy   . Urinary tract infection, site not specified   . Muscle weakness (generalized)   . Acute sinusitis, unspecified   . Herpes zoster without  mention of complication   . 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 Type II or unspecified type diabetes mellitus without mention of complication, not stated as uncontrolled   . Other and unspecified hyperlipidemia   . Anxiety state, unspecified   . Depressive disorder, not elsewhere classified   . Unspecified essential hypertension   . Atrial fibrillation   . Chronic airway obstruction, not elsewhere classified   . Osteoarthrosis, unspecified whether generalized or localized, unspecified site   . Lumbago    Past Surgical History  Procedure Laterality Date  . Lung lobectomy  1986    left  . Septoplasty    . Total hip arthroplasty  01/2010    left  . Bronchoscopy  02/2011  . Dilation and curettage of uterus  1980's  . Tubal ligation  1980's  . Cardiac catheterization    . Fetal blood transfusion  09/16/11  . Joint replacement  04/13/10    Left Hip     Review of systems: She denies chest pain. She does develop shortness of breath with exertion.  Physical exam:  Filed Vitals:   10/26/12 1322  BP: 134/58  Pulse: 92  Height: 5' 5.5" (1.664 m)  Weight: 173 lb 4.8 oz (78.608 kg)  SpO2: 99%   Neck: No carotid bruits  Chest: Clear to auscultation bilaterally  Cardiac: Regular rate and rhythm without murmur  Abdomen: Soft nontender no mass  Extremities: 1+ femoral pulses bilaterally with absent popliteal and pedal pulses  Skin: No open ulcerations  Data: Patient had bilateral ABIs performed today. Right side was 0.92 with a digit pressure of 94 left side was 0.94 with a digit pressure of 95 waveforms were biphasic to triphasic bilaterally this represents a slight decline compared to June of 2013. However this is improved from June of 2011 and 2012. I reviewed and interpreted this study.  Assessment: Bilateral lower extremity peripheral arterial disease fairly mild in nature. Most of her symptoms seem more like neuropathy and related to her arterial occlusive disease. She  is not a very good candidate for intervention due to her active lung cancer as well as her overall minimally ambulatory state. She has adequate perfusion and is currently not at risk of limb loss.  Plan: The patient was given a prescription today for Neurontin 300 mg 3 times a day to improve her neuropathy symptoms. She will followup with repeat ABIs in one year.  Fabienne Bruns, MD [facility] Office: [phone] Pager: [phone]

## 2012-10-31 NOTE — Addendum Note (Signed)
Addended by: Sharee Pimple on: 10/31/2012 09:45 AM   Modules accepted: Orders

## 2012-11-09 ENCOUNTER — Other Ambulatory Visit: Payer: Self-pay | Admitting: Internal Medicine

## 2012-11-10 ENCOUNTER — Other Ambulatory Visit: Payer: Self-pay | Admitting: Emergency Medicine

## 2012-11-10 MED ORDER — LORATADINE 10 MG PO TABS: 10.0000 mg | ORAL_TABLET | Freq: Every day | ORAL | Status: DC

## 2012-11-23 DIAGNOSIS — IMO0002 Reserved for concepts with insufficient information to code with codable children: Secondary | ICD-10-CM | POA: Diagnosis not present

## 2012-11-23 DIAGNOSIS — H11159 Pinguecula, unspecified eye: Secondary | ICD-10-CM | POA: Diagnosis not present

## 2012-12-06 ENCOUNTER — Telehealth: Payer: Self-pay | Admitting: *Deleted

## 2012-12-06 NOTE — Telephone Encounter (Signed)
Patient called yesterday and today and left a message for me to call her regarding her Claritin not working and she having congestion. Tried calling patient for the past 2 days and the number left # [phone] rings busy each time. Tried calling the home number and it is disconnected.

## 2012-12-12 ENCOUNTER — Telehealth: Payer: Self-pay | Admitting: Emergency Medicine

## 2012-12-12 NOTE — Telephone Encounter (Signed)
ATCx2. Line busy. WCB. Carron Curie, CMA

## 2012-12-13 ENCOUNTER — Encounter: Payer: Self-pay | Admitting: Nurse Practitioner

## 2012-12-13 ENCOUNTER — Ambulatory Visit (INDEPENDENT_AMBULATORY_CARE_PROVIDER_SITE_OTHER): Payer: Medicare Other | Admitting: Nurse Practitioner

## 2012-12-13 ENCOUNTER — Other Ambulatory Visit: Payer: Self-pay | Admitting: Internal Medicine

## 2012-12-13 ENCOUNTER — Ambulatory Visit
Admission: RE | Admit: 2012-12-13 | Discharge: 2012-12-13 | Disposition: A | Discharge status: Home / Self Care | Payer: Medicare Other | Source: Ambulatory Visit | Attending: Nurse Practitioner | Admitting: Nurse Practitioner

## 2012-12-13 VITALS — BP 154/60 | HR 91 | Temp 99.1°F | Resp 18 | Ht 65.5 in | Wt 179.2 lb

## 2012-12-13 DIAGNOSIS — J209 Acute bronchitis, unspecified: Secondary | ICD-10-CM

## 2012-12-13 DIAGNOSIS — R0602 Shortness of breath: Secondary | ICD-10-CM | POA: Diagnosis not present

## 2012-12-13 MED ORDER — AMOXICILLIN-POT CLAVULANATE 875-125 MG PO TABS: 1.0000 | ORAL_TABLET | Freq: Two times a day (BID) | ORAL | Status: DC

## 2012-12-13 MED ORDER — SACCHAROMYCES BOULARDII 250 MG PO CAPS: 250.0000 mg | ORAL_CAPSULE | Freq: Two times a day (BID) | ORAL | Status: DC

## 2012-12-13 NOTE — Patient Instructions (Addendum)
Medications sent to pharmacy Once you leave the office go get chest xray- to rule out pneumonia   Augmentin 1 tablet twice daily for 10 days  Florastor twice daily for GI health  Cont combivent every 4 hours scheduled while awake when on antibiotic  May take [facility] DM 2 tablets twice daily for 10 days for cough and congestion then as needed

## 2012-12-13 NOTE — Telephone Encounter (Signed)
ATC - Line has a fast busy signal.  Home number is disconnected.  Will try back later

## 2012-12-13 NOTE — Progress Notes (Signed)
Patient ID: [name], female   DOB: Mar 28, 1946, 67 y.o.   MRN: [id number]   Allergies  Allergen Reactions  . Iohexol Shortness Of Breath and Other (See Comments)    "burns me up inside"    Pt does fine with 13 hour prep  01/17/12  . Other     CAN'T STAND THE SMELL OF PURE BLEACH; "HAVE TO BACK UP AND POUR AND DON'T BREATH IT TIL i GET CAP BACK ON; DILUTE IT TO SPRAY"  . Codeine Itching and Nausea And Vomiting    REACTION: GI upset    Chief Complaint  Patient presents with  . Follow-up    chest congestion, cough    HPI: Patient is a 67 y.o. female seen in the office today due to chest congestion Chronic sinusitis every spring and fall and now reports it is in her lungs For the last week and a half she reports increase cough, indigestion, acid reflux, chest congestion which always ends up into a bronchitis  Has taken mucinex which has not helped  Reports an episode like this 6 months ago and had to go on antibiotics  Reports fever and chills but has not taken her temperature. Reports increase shortness of breath; uses O2-- 2.5L   Able to eat and drink Review of Systems:  Review of Systems  Constitutional: Positive for fever and malaise/fatigue. Negative for chills.  Respiratory: Positive for cough, sputum production and shortness of breath. Negative for wheezing.   Cardiovascular: Negative for chest pain.  Gastrointestinal: Negative for heartburn, abdominal pain, diarrhea and constipation.  Genitourinary: Negative for dysuria, urgency and frequency.  Musculoskeletal: Negative for myalgias.  Skin: Negative.   Neurological: Negative for dizziness, weakness and headaches.     Past Medical History  Diagnosis Date  . 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 Restless legs syndrome (RLS)   . Chronic sinusitis   . Hypertension   . Hyperlipidemia   . Anemia, unspecified   . GERD (gastroesophageal reflux disease)   . Respiratory failure with hypoxia 01/02/2010  . Complication  of anesthesia     slow to wake up  . Asthma   . Coronary artery disease     Dr. Excell Seltzer had stress test done  . Type II or unspecified type diabetes mellitus without mention of complication, not stated as uncontrolled   . Peripheral vascular disease   . PAD (peripheral artery disease)   . Heart murmur   . Dysrhythmia     irregular  . Anginal pain   . History of blood clots     "2 in my aorta"  . Hard of hearing, right   . History of bronchitis   . Bronchial pneumonia     history  . COPD (chronic obstructive pulmonary disease)   . Emphysema   . Shortness of breath     "anytime really"  . Syncope and collapse   . Adenocarcinoma, lung     upper left lobe  . 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 Chemotherapy adverse reaction 09/15/11    "makes me light headed and pass out; this is 3rd blood transfusion since going thru it"  . History of blood transfusion   . Kidney infection     "related to chemo"  . Sinus headache     "all the time"  . Arthritis     "all over my body"  . Fibromyalgia   . Osteoporosis   . DDD (degenerative  disc disease), lumbar   . Scoliosis   . Anxiety   . Mental disorder   . Reflux   . CHF (congestive heart failure)   . DVT (deep venous thrombosis)   . Hx of radiation therapy 04/28/11 -06/08/11    right upper lobe-lung  . Acute upper respiratory infections of unspecified site   . Pain in thoracic spine   . Asymptomatic varicose veins   . Functional urinary incontinence   . Diseases of lips   . Other abnormal blood chemistry   . Effects of radiation, unspecified   . Anemia in neoplastic disease   . Muscle weakness (generalized)   . Dizziness and giddiness   . Spasm of muscle   . Unspecified vitamin D deficiency   . Hyperpotassemia   . Acute posthemorrhagic anemia   . Unspecified hearing loss   . Disorder of bone and cartilage, unspecified   . Screening for thyroid disorder   . Malignant neoplasm of  bronchus and lung, unspecified site   . Hypopotassemia   . Atherosclerosis of native arteries of the extremities, unspecified   . Reflux esophagitis   . Unspecified constipation   . Type II or unspecified type diabetes mellitus with neurological manifestations, uncontrolled(250.62)   . Unspecified hereditary and idiopathic peripheral neuropathy   . Urinary tract infection, site not specified   . Muscle weakness (generalized)   . Acute sinusitis, unspecified   . Herpes zoster without mention of complication   . 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 Type II or unspecified type diabetes mellitus without mention of complication, not stated as uncontrolled   . Other and unspecified hyperlipidemia   . Anxiety state, unspecified   . Depressive disorder, not elsewhere classified   . Unspecified essential hypertension   . Atrial fibrillation   . Chronic airway obstruction, not elsewhere classified   . Osteoarthrosis, unspecified whether generalized or localized, unspecified site   . Lumbago    Past Surgical History  Procedure Laterality Date  . Lung lobectomy  1986    left  . Septoplasty    . Total hip arthroplasty  01/2010    left  . Bronchoscopy  02/2011  . Dilation and curettage of uterus  1980's  . Tubal ligation  1980's  . Cardiac catheterization    . Fetal blood transfusion  09/16/11  . Joint replacement  04/13/10    Left Hip   Social History:   reports that she quit smoking about 15 years ago. Her smoking use included Cigarettes. She has a 60 pack-year smoking history. She has never used smokeless tobacco. She reports that she does not drink alcohol or use illicit drugs.  Family History  Problem Relation Age of Onset  . Bone cancer Father   . Cancer Father   . Diabetes Mother   . Deep vein thrombosis Mother   . Heart disease Mother     Heart Disease before age 76  . Hyperlipidemia Mother   . Hypertension Mother   . Arthritis Daughter     Medications: Patient's  Medications  New Prescriptions   No medications on file  Previous Medications   ALBUTEROL-IPRATROPIUM (COMBIVENT) 18-103 MCG/ACT INHALER    2 puffs every for hours for lung cancer   ALPRAZOLAM (XANAX) 1 MG TABLET    Take one tablet three times a day for anxiety   ASPIRIN EC 325 MG TABLET    Take 325 mg by mouth daily.   CALCIUM CITRATE-VITAMIN D (CITRACAL +  D PO)    Take 2 tablets by mouth daily.   CITALOPRAM (CELEXA) 40 MG TABLET    Take one tablet once a day for depression at bedtime   CLINDAMYCIN (CLEOCIN) 300 MG CAPSULE    Take 300 mg by mouth 3 (three) times daily. Take 1 tablet four times a day   CYCLOBENZAPRINE (FLEXERIL) 5 MG TABLET    Take 5 mg by mouth 3 (three) times daily as needed for muscle spasms. Take one tablet at bedtime if needed   DILTIAZEM (DILACOR XR) 240 MG 24 HR CAPSULE    Take one tablet once a day for blood pressure   DIPHENHYDRAMINE (BENADRYL) 50 MG TABLET    Take 1 tablet (50 mg total) by mouth as directed. 1 hour before CT scan   GEMFIBROZIL (LOPID) 600 MG TABLET    Take 1 tablet 2 times  A day to help lower triglycerides and cholesterol   HEMOCYTE-F 324-1 MG TABS    TAKE 1 TABLET BY MOUTH DAILY   HYDROCODONE-ACETAMINOPHEN (NORCO) 7.5-325 MG PER TABLET    Take 1 tablet by mouth every 6 (six) hours as needed for pain. Take one tablet every 6 hours as needed for breakthrough pain   LIDOCAINE (LIDODERM) 5 %    Place 1 patch onto the skin daily as needed. For shoulder pain.Remove & Discard patch within 12 hours or as directed by MD.   LORATADINE (CLARITIN) 10 MG TABLET    Take 1 tablet (10 mg total) by mouth daily.   LOSARTAN (COZAAR) 50 MG TABLET    Take one tablet twice a day for blood pressure   MULTIPLE VITAMINS-MINERALS (CENTRUM SILVER PO)    Take 1 tablet by mouth daily.   NASONEX 50 MCG/ACT NASAL SPRAY    USE 2 SPRAYS IN EACH NOSTRIL TWICE DAILY   OMEPRAZOLE (PRILOSEC) 10 MG CAPSULE    Take 1 capsule (10 mg total) by mouth daily.   POTASSIUM CHLORIDE (K-DUR) 10  MEQ TABLET    Take 10 mEq by mouth 2 (two) times daily. Take one tablet once a day for potassium supplement   SENNA (SENOKOT) 8.6 MG TABS    Take 1 tablet by mouth at bedtime and may repeat dose one time if needed. For constipation.   SIMETHICONE (GAS-X EXTRA STRENGTH PO)    Take 1 tablet by mouth at bedtime as needed. As needed for gas.   TRAMADOL (ULTRAM) 50 MG TABLET    Take 50 mg by mouth every 4 (four) hours as needed. For moderate pain   TRIAMCINOLONE OINTMENT (KENALOG) 0.1 %    Apply 1 application topically 2 (two) times daily as needed. As needed for irritated lips.   TRIAMTERENE-HYDROCHLOROTHIAZIDE (MAXZIDE-25) 37.5-25 MG PER TABLET    Take one tablet once a day  Modified Medications   No medications on file  Discontinued Medications   GABAPENTIN (NEURONTIN) 300 MG CAPSULE    Take 1 capsule (300 mg total) by mouth daily.   LORATADINE (CLARITIN) 10 MG TABLET    Take 1 tablet (10 mg total) by mouth daily.     Physical Exam:  Filed Vitals:   12/13/12 1432  BP: 154/60  Pulse: 91  Temp: 99.1 F (37.3 C)  TempSrc: Oral  Resp: 18  Height: 5' 5.5" (1.664 m)  Weight: 179 lb 3.2 oz (81.285 kg)  SpO2: 98%    Physical Exam  Vitals reviewed. Constitutional: She is oriented to person, place, and time and well-developed, well-nourished, and in no distress.  No distress.  HENT:  Head: Normocephalic and atraumatic.  Right Ear: External ear normal.  Left Ear: External ear normal.  Nose: Nose normal.  Mouth/Throat: Oropharynx is clear and moist. No oropharyngeal exudate.  Eyes: Conjunctivae and EOM are normal. Pupils are equal, round, and reactive to light.  Neck: Normal range of motion. Neck supple.  Cardiovascular: Normal rate, regular rhythm and normal heart sounds.   Pulmonary/Chest: Effort normal and breath sounds normal. No respiratory distress.  Abdominal: Soft. Bowel sounds are normal. She exhibits no distension.  Musculoskeletal: Normal range of motion.  Lymphadenopathy:     She has no cervical adenopathy.  Neurological: She is alert and oriented to person, place, and time.  Skin: Skin is warm and dry. She is not diaphoretic.     Labs reviewed: Basic Metabolic Panel:  Recent Labs  41/66/06 1009 07/21/12 1125 10/03/12 1432  NA 138 140 141  K 3.4* 4.0 3.5  CL 98 99 96*  CO2 29 25 29   GLUCOSE 100* 184* 123*  BUN 22 22.9 19  CREATININE 0.96 1.2* 1.06*  CALCIUM 9.9 10.5* 10.3*   Liver Function Tests:  Recent Labs  01/17/12 1154 04/24/12 1141 07/21/12 1125 10/03/12 1432  AST 16 15 18 12   ALT 15 11 13 7   ALKPHOS 124 117 136 95  BILITOT 0.30 0.37 0.36 0.1  PROT 7.5 7.4 7.7 6.1  ALBUMIN 3.8 3.9 4.1  --    No results found for this basename: LIPASE, AMYLASE,  in the last 8760 hours No results found for this basename: AMMONIA,  in the last 8760 hours CBC:  Recent Labs  04/24/12 1141 05/23/12 1009 07/21/12 1125 10/03/12 1432  WBC 10.8* 10.1 9.6 8.1  NEUTROABS 9.8* 7.4 8.7* 5.5  HGB 10.5* 10.4* 10.9* 9.1*  HCT 31.0* 29.9* 31.7* 26.7*  MCV 95.8 92.6 94.1 91  PLT 268 268 276  --     Assessment/Plan 1. Acute bronchitis Will get chest xray; start antibiotic twice daily with florastor BID To seek medical attention if symptoms worsen, fever or chill, unable to keep hydrated or shortness of breath worsens  - amoxicillin-clavulanate (AUGMENTIN) 875-125 MG per tablet; Take 1 tablet by mouth 2 (two) times daily.  Dispense: 20 tablet; Refill: 0 - saccharomyces boulardii (FLORASTOR) 250 MG capsule; Take 1 capsule (250 mg total) by mouth 2 (two) times daily.  Dispense: 30 capsule; Refill: 0 - DG Chest 2 View

## 2012-12-15 NOTE — Telephone Encounter (Signed)
ATC PT line still fast busy signal wcb

## 2012-12-15 NOTE — Telephone Encounter (Signed)
Per protocol I will sign off on message. Azucena Dart, CMA  

## 2012-12-15 NOTE — Telephone Encounter (Signed)
ATC PT fast busy signal. No alternate # in chart wcb

## 2012-12-19 ENCOUNTER — Telehealth: Payer: Self-pay | Admitting: *Deleted

## 2012-12-19 NOTE — Telephone Encounter (Signed)
pts chest xray revealed no pneumonia. Bronchitis takes time and could be viral- no antibiotic for that but we are covering broad spectrum. Pt should stay on probiotic and take twice daily; can also add yogurt.  mucinex DM can take 2 tablets twice daily with full glass of water Make sure she is staying hydrated and eating properly.  If she is getting worse -- having worsening shortness of breath, fevers, chills, fatigue or unable to eat or drink she will need to follow up in the office or go to the hospital.

## 2012-12-19 NOTE — Telephone Encounter (Signed)
Patient states she is no better, still has the chest congestion and has lung cancer. Has been seen twice and the antibiotic she has been on since the 3rd is not helping. Patient thinks it needs to be switched to something else. Patient states that her stomach has been "so tore up". She also wants to know if you can switch her Probiotic also.  She states she needs to get cleared up so she can take the flu shot. Please Advise.  Pharmacy: [facility]

## 2012-12-20 NOTE — Telephone Encounter (Signed)
Patient Notified and agreed. 

## 2012-12-20 NOTE — Telephone Encounter (Signed)
LMOM to return call.

## 2012-12-26 ENCOUNTER — Telehealth: Payer: Self-pay | Admitting: Emergency Medicine

## 2012-12-26 NOTE — Telephone Encounter (Signed)
I spoke with the [name] and she went to PCP on 12-13-12 and was diagnosed with bronchitis and given abx and mucinex. [name] states she does note feel that she is better. I advised since it has been a while since she has been seen and that she is not better then OV is needed. Appt set for tomorrow at 2:15. Carron Curie, CMA

## 2012-12-26 NOTE — Telephone Encounter (Signed)
Patient called back today and said that she is still having chest congestion and no better. Patient had an appointment for tomorrow to be seen and she cancelled it and said she was not coming in. Told patient what [name] said in the message and patient stated that she would just call her Lung Dr. And see what he had to say. Told her that was fine, but if she needed [place] to call us and we would get her in to be seen. She agreed.

## 2012-12-27 ENCOUNTER — Ambulatory Visit (INDEPENDENT_AMBULATORY_CARE_PROVIDER_SITE_OTHER): Payer: Medicare Other | Admitting: Emergency Medicine

## 2012-12-27 ENCOUNTER — Encounter: Payer: Self-pay | Admitting: Emergency Medicine

## 2012-12-27 ENCOUNTER — Ambulatory Visit: Payer: Medicare Other | Admitting: Internal Medicine

## 2012-12-27 VITALS — BP 120/58 | HR 99 | Temp 98.0°F | Ht 65.5 in | Wt 181.0 lb

## 2012-12-27 DIAGNOSIS — J449 Chronic obstructive pulmonary disease, unspecified: Secondary | ICD-10-CM | POA: Diagnosis not present

## 2012-12-27 DIAGNOSIS — Z23 Encounter for immunization: Secondary | ICD-10-CM

## 2012-12-27 MED ORDER — FLUCONAZOLE 100 MG PO TABS: 100.0000 mg | ORAL_TABLET | Freq: Every day | ORAL | Status: DC

## 2012-12-27 NOTE — Progress Notes (Signed)
Subjective:    Patient ID: [name], female    DOB: Mar 16, 1946, 67 y.o.   MRN: [id number] HPI History of Present Illness:  [name] is a 67 y.o. 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 Has received XRT and chemo with Drs Michell Heinrich and Arbutus Ped.  She tolerated XRT completely. She has completed 3 cycles chemo.  Currently managed on combivent qid, started O2.   ROV 11/26/11 -- COPD with adenoCA 12/12 (chemo/XRT).  Presents today reporting R back and shoulder pain - is musculoskeletal, is involving R chest and R shoulder as well. She was exerting herself 3 weeks ago, believes she was sore afterwards and now with muscle pain. Dr Sander Radon gave her flexeril. [facility] Dr Vear Clock. She is having more cough, which is exacerbating her back and shoulder pain.   ROV 03/03/12 -- COPD with adenoCA 12/12 (chemo/XRT). Currently on combivent, taking it prn. 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 Stable CT scan on 01/17/12. She is having more SOB. Was started on avelox recently for sinusitis, no other exacerbations. Marginal compliance with her O2  ROV 05/16/12 -- COPD with adenoCA 12/12. She developed URI sx, nasal obstruction, clear to yellow nasal congestion, scratchy throat. She tried restarting restarting claritin and mucinex-D a couple weeks ago. She has been using both prn - little improvement. She has a throat trickle, cough that is sometimes productive - white, yellow, clear. Using combivent twice a day for last two weeks > helps her cough.   Acute OV 12/27/12 -- COPD with adenoCA 12/12 (chemo/XRT). For last 2 weeks has had nasal drainage, low energy, cough usually non-prod, more indigestion. Was treated w Augmentin. She started mucinex DM x 2 weeks. She stopped loratadine 3 weeks ago because pressure in her head. Last CT scan was in 07/2012. Due to see Dr Arbutus Ped next month.       Objective:   Physical  Exam Filed Vitals:   12/27/12 1443  BP: 120/58  Pulse: 99  Temp: 98 F (36.7 C)   Gen: Pleasant, elderly woman, in no distress,  normal affect  ENT: No lesions,  mouth clear,  oropharynx clear, no postnasal drip  Neck: No JVD, no TMG, no carotid bruits  Lungs: No use of accessory muscles, distant, no wheezes or crackles  Cardiovascular: RRR, heart sounds normal, no murmur or gallops, no peripheral edema  Musculoskeletal: No deformities, no cyanosis or clubbing  Neuro: alert, non focal  Skin: Warm, no lesions or rashes   07/25/12 --  Comparison: CT chest dated 04/24/2012. MRI thoracic spine dated  05/03/2012.  Findings: Prior left upper lobectomy.  Stable posterior right upper lobe/paramediastinal lung  consolidation (series 2/image 22), likely reflecting radiation  changes. Additional radiation changes in the posterior left upper  lung (series 5/image 21).  No suspicious pulmonary nodules. Underlying moderate centrilobular  and paraseptal emphysematous changes. No pleural effusion or  pneumothorax.  Small left thyroid nodule (series 2/image 8), unchanged.  The heart is normal in size. Trace pericardial fluid. Coronary  atherosclerosis. Atherosclerotic calcifications of the aortic  arch.  No suspicious mediastinal, hilar, or axillary lymphadenopathy.  Visualized upper abdomen is notable for a stable 11 mm left adrenal  nodule (series 2/57), previously characterized as a benign adrenal  adenoma. Cholelithiasis, without associated inflammatory changes.  Mild compression deformity at T4, corresponding to pathologic  fracture on MRI, with interval vertebral augmentation. Early/mild  superior endplate changes at T5 (sagittal image 58).  IMPRESSION:  Prior left upper lobectomy.  Stable right upper lobe/paramediastinal radiation changes. Stable  radiation changes in the posterior left upper lung.  Mild compression deformity at T4, corresponding to known pathologic   fracture, with associated vertebral augmentation.  No evidence of new/progressive metastatic disease in the chest.       Assessment & Plan:  No problem-specific assessment & plan notes found for this encounter.

## 2012-12-27 NOTE — Assessment & Plan Note (Addendum)
Exacerbated by more PND, possibly more GERD.  - would like to increase omeprazole to bid - add chlorpheniramine - same BDs - fluconazole for yeast vaginitis - rov 1 month

## 2012-12-27 NOTE — Patient Instructions (Addendum)
Take fluconazole for 3 days Stop mucinex - DM Try to restart loratadine (Claritin) every other day Use decongestants that contain chlorpheniramine (OTC) as directed Continue your other medications as ordered Flu shot today Follow with Dr Delton Coombes in 1 month

## 2012-12-28 ENCOUNTER — Telehealth: Payer: Self-pay | Admitting: Emergency Medicine

## 2012-12-28 NOTE — Telephone Encounter (Signed)
Pt states the pharmacy did not receive the rx for fluconazole yesterday and wants it re-sent.I  called pharmacy and they do have the rx and will fill it and get it delivered to the pt. Pt is aware. Carron Curie, CMA

## 2013-01-10 ENCOUNTER — Other Ambulatory Visit: Payer: Self-pay | Admitting: Internal Medicine

## 2013-01-12 DIAGNOSIS — H25019 Cortical age-related cataract, unspecified eye: Secondary | ICD-10-CM | POA: Diagnosis not present

## 2013-01-12 DIAGNOSIS — H524 Presbyopia: Secondary | ICD-10-CM | POA: Diagnosis not present

## 2013-01-12 DIAGNOSIS — H25049 Posterior subcapsular polar age-related cataract, unspecified eye: Secondary | ICD-10-CM | POA: Diagnosis not present

## 2013-01-12 DIAGNOSIS — H04129 Dry eye syndrome of unspecified lacrimal gland: Secondary | ICD-10-CM | POA: Diagnosis not present

## 2013-01-12 DIAGNOSIS — H2510 Age-related nuclear cataract, unspecified eye: Secondary | ICD-10-CM | POA: Diagnosis not present

## 2013-01-16 ENCOUNTER — Other Ambulatory Visit: Payer: Self-pay | Admitting: *Deleted

## 2013-01-16 DIAGNOSIS — C3490 Malignant neoplasm of unspecified part of unspecified bronchus or lung: Secondary | ICD-10-CM

## 2013-01-16 MED ORDER — PREDNISONE 50 MG PO TABS: ORAL_TABLET | ORAL | Status: DC

## 2013-01-16 MED ORDER — DIPHENHYDRAMINE HCL 50 MG PO TABS: 50.0000 mg | ORAL_TABLET | ORAL | Status: DC

## 2013-01-22 ENCOUNTER — Other Ambulatory Visit: Payer: Self-pay | Admitting: Internal Medicine

## 2013-01-22 ENCOUNTER — Other Ambulatory Visit (HOSPITAL_BASED_OUTPATIENT_CLINIC_OR_DEPARTMENT_OTHER): Payer: Medicare Other | Admitting: Lab

## 2013-01-22 ENCOUNTER — Ambulatory Visit (HOSPITAL_COMMUNITY)
Admission: RE | Admit: 2013-01-22 | Discharge: 2013-01-22 | Disposition: A | Discharge status: Home / Self Care | Payer: Medicare Other | Source: Ambulatory Visit | Attending: Internal Medicine | Admitting: Internal Medicine

## 2013-01-22 ENCOUNTER — Encounter (HOSPITAL_COMMUNITY): Payer: Self-pay

## 2013-01-22 ENCOUNTER — Encounter (INDEPENDENT_AMBULATORY_CARE_PROVIDER_SITE_OTHER): Payer: Self-pay

## 2013-01-22 DIAGNOSIS — J90 Pleural effusion, not elsewhere classified: Secondary | ICD-10-CM | POA: Diagnosis not present

## 2013-01-22 DIAGNOSIS — I2510 Atherosclerotic heart disease of native coronary artery without angina pectoris: Secondary | ICD-10-CM | POA: Diagnosis not present

## 2013-01-22 DIAGNOSIS — K7689 Other specified diseases of liver: Secondary | ICD-10-CM | POA: Insufficient documentation

## 2013-01-22 DIAGNOSIS — M8448XA Pathological fracture, other site, initial encounter for fracture: Secondary | ICD-10-CM | POA: Insufficient documentation

## 2013-01-22 DIAGNOSIS — E278 Other specified disorders of adrenal gland: Secondary | ICD-10-CM | POA: Insufficient documentation

## 2013-01-22 DIAGNOSIS — C3491 Malignant neoplasm of unspecified part of right bronchus or lung: Secondary | ICD-10-CM

## 2013-01-22 DIAGNOSIS — J701 Chronic and other pulmonary manifestations due to radiation: Secondary | ICD-10-CM | POA: Diagnosis not present

## 2013-01-22 DIAGNOSIS — C3410 Malignant neoplasm of upper lobe, unspecified bronchus or lung: Secondary | ICD-10-CM | POA: Diagnosis not present

## 2013-01-22 DIAGNOSIS — K8020 Calculus of gallbladder without cholecystitis without obstruction: Secondary | ICD-10-CM | POA: Insufficient documentation

## 2013-01-22 DIAGNOSIS — J438 Other emphysema: Secondary | ICD-10-CM | POA: Diagnosis not present

## 2013-01-22 DIAGNOSIS — C3490 Malignant neoplasm of unspecified part of unspecified bronchus or lung: Secondary | ICD-10-CM | POA: Insufficient documentation

## 2013-01-22 LAB — CBC WITH DIFFERENTIAL/PLATELET
BASO%: 0.2 % (ref 0.0–2.0)
Eosinophils Absolute: 0 10*3/uL (ref 0.0–0.5)
HGB: 11.2 g/dL — ABNORMAL LOW (ref 11.6–15.9)
LYMPH%: 8.8 % — ABNORMAL LOW (ref 14.0–49.7)
MCHC: 34.2 g/dL (ref 31.5–36.0)
MCV: 91.4 fL (ref 79.5–101.0)
MONO#: 0.1 10*3/uL (ref 0.1–0.9)
MONO%: 0.7 % (ref 0.0–14.0)
NEUT#: 8.1 10*3/uL — ABNORMAL HIGH (ref 1.5–6.5)
RBC: 3.58 10*6/uL — ABNORMAL LOW (ref 3.70–5.45)
RDW: 12.8 % (ref 11.2–14.5)
WBC: 9 10*3/uL (ref 3.9–10.3)
lymph#: 0.8 10*3/uL — ABNORMAL LOW (ref 0.9–3.3)

## 2013-01-22 LAB — COMPREHENSIVE METABOLIC PANEL (CC13)
ALT: 16 U/L (ref 0–55)
AST: 17 U/L (ref 5–34)
Albumin: 3.9 g/dL (ref 3.5–5.0)
Alkaline Phosphatase: 104 U/L (ref 40–150)
Anion Gap: 14 mEq/L — ABNORMAL HIGH (ref 3–11)
CO2: 25 mEq/L (ref 22–29)
Calcium: 10.6 mg/dL — ABNORMAL HIGH (ref 8.4–10.4)
Chloride: 103 mEq/L (ref 98–109)
Glucose: 198 mg/dl — ABNORMAL HIGH (ref 70–140)
Potassium: 3.9 mEq/L (ref 3.5–5.1)
Sodium: 141 mEq/L (ref 136–145)
Total Bilirubin: 0.32 mg/dL (ref 0.20–1.20)
Total Protein: 7.6 g/dL (ref 6.4–8.3)

## 2013-01-22 MED ORDER — IOHEXOL 300 MG/ML  SOLN
80.0000 mL | Freq: Once | INTRAMUSCULAR | Status: AC | PRN
  Administered 2013-01-22: 80 mL via INTRAVENOUS

## 2013-01-22 NOTE — Telephone Encounter (Signed)
Spoke with Alcario Drought, rx is on file from 01-10-13

## 2013-01-24 ENCOUNTER — Encounter: Payer: Self-pay | Admitting: Internal Medicine

## 2013-01-24 ENCOUNTER — Encounter (INDEPENDENT_AMBULATORY_CARE_PROVIDER_SITE_OTHER): Payer: Self-pay

## 2013-01-24 ENCOUNTER — Ambulatory Visit (HOSPITAL_BASED_OUTPATIENT_CLINIC_OR_DEPARTMENT_OTHER): Payer: Medicare Other | Admitting: Internal Medicine

## 2013-01-24 ENCOUNTER — Other Ambulatory Visit: Payer: Self-pay | Admitting: *Deleted

## 2013-01-24 VITALS — BP 157/58 | HR 74 | Temp 98.1°F | Resp 20 | Ht 65.5 in | Wt 179.0 lb

## 2013-01-24 DIAGNOSIS — Z85118 Personal history of other malignant neoplasm of bronchus and lung: Secondary | ICD-10-CM | POA: Diagnosis not present

## 2013-01-24 DIAGNOSIS — C3491 Malignant neoplasm of unspecified part of right bronchus or lung: Secondary | ICD-10-CM

## 2013-01-24 MED ORDER — AMBULATORY NON FORMULARY MEDICATION: Status: DC

## 2013-01-24 NOTE — Patient Instructions (Signed)
Followup visit in 6 months with repeat CT scan of the chest. 

## 2013-01-24 NOTE — Progress Notes (Signed)
[facility] [phone]   [phone]  OFFICE PROGRESS NOTE  [name]., MD [address] [place] [address]  DIAGNOSIS: Stage IIB/IIIA non-small cell lung cancer consistent with adenocarcinoma diagnosed in December 2012.   PRIOR THERAPY:  1) Status post radiation therapy to the right upper lobe lung mass completed on 06/04/2011 under the care of Dr. Michell Heinrich.  2) Systemic chemotherapy with carboplatin for AUC of 5 and Alimta 500 mg/M2 giving every 3 weeks, status post 3 cycles, last dose was given on 07/21/2011.   CURRENT THERAPY: Observation.  INTERVAL HISTORY: [name] 67 y.o. female returns to the clinic today for routine three month follow up visit. The patient is feeling fine today with no specific complaints except for the baseline shortness of breath. She is currently on home oxygen. The patient denied having any significant chest pain except for occasional soreness of the right side of the chest with no cough or hemoptysis. She denied having any significant weight loss or night sweats. She had repeat CT scan of the chest performed recently and she is here for evaluation and discussion of her scan results.  MEDICAL HISTORY: Past Medical History  Diagnosis Date  . 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 Restless legs syndrome (RLS)   . Chronic sinusitis   . Hypertension   . Hyperlipidemia   . Anemia, unspecified   . GERD (gastroesophageal reflux disease)   . Respiratory failure with hypoxia 01/02/2010  . Complication of anesthesia     slow to wake up  . Asthma   . Coronary artery disease     Dr. Excell Seltzer had stress test done  . Type II or unspecified type diabetes mellitus without mention of complication, not stated as uncontrolled   . Peripheral vascular disease   . PAD (peripheral artery disease)   . Heart murmur   . Dysrhythmia     irregular  . Anginal pain   . History of blood clots     "2 in my aorta"  .  Hard of hearing, right   . History of bronchitis   . Bronchial pneumonia     history  . COPD (chronic obstructive pulmonary disease)   . Emphysema   . Shortness of breath     "anytime really"  . Syncope and collapse   . Adenocarcinoma, lung     upper left lobe  . 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 Chemotherapy adverse reaction 09/15/11    "makes me light headed and pass out; this is 3rd blood transfusion since going thru it"  . History of blood transfusion   . Kidney infection     "related to chemo"  . Sinus headache     "all the time"  . Arthritis     "all over my body"  . Fibromyalgia   . Osteoporosis   . DDD (degenerative disc disease), lumbar   . Scoliosis   . Anxiety   . Mental disorder   . Reflux   . CHF (congestive heart failure)   . DVT (deep venous thrombosis)   . Hx of radiation therapy 04/28/11 -06/08/11    right upper lobe-lung  . Acute upper respiratory infections of unspecified site   . Pain in thoracic spine   . Asymptomatic varicose veins   . Functional urinary incontinence   . Diseases of lips   . Other abnormal blood chemistry   .  Effects of radiation, unspecified   . Anemia in neoplastic disease   . Muscle weakness (generalized)   . Dizziness and giddiness   . Spasm of muscle   . Unspecified vitamin D deficiency   . Hyperpotassemia   . Acute posthemorrhagic anemia   . Unspecified hearing loss   . Disorder of bone and cartilage, unspecified   . Screening for thyroid disorder   . Malignant neoplasm of bronchus and lung, unspecified site   . Hypopotassemia   . Atherosclerosis of native arteries of the extremities, unspecified   . Reflux esophagitis   . Unspecified constipation   . Type II or unspecified type diabetes mellitus with neurological manifestations, uncontrolled(250.62)   . Unspecified hereditary and idiopathic peripheral neuropathy   . Urinary tract infection, site not specified   . Muscle  weakness (generalized)   . Acute sinusitis, unspecified   . Herpes zoster without mention of complication   . 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 Type II or unspecified type diabetes mellitus without mention of complication, not stated as uncontrolled   . Other and unspecified hyperlipidemia   . Anxiety state, unspecified   . Depressive disorder, not elsewhere classified   . Unspecified essential hypertension   . Atrial fibrillation   . Chronic airway obstruction, not elsewhere classified   . Osteoarthrosis, unspecified whether generalized or localized, unspecified site   . Lumbago     ALLERGIES:  is allergic to iohexol; other; and codeine.  MEDICATIONS:  Current Outpatient Prescriptions  Medication Sig Dispense Refill  . albuterol-ipratropium (COMBIVENT) 18-103 MCG/ACT inhaler 2 puffs every for hours for lung cancer  3 Inhaler  3  . ALPRAZolam (XANAX) 1 MG tablet TAKE 1 TABLET BY MOUTH THREE TIMES A DAY AS NEEDED FOR ANXIETY  90 tablet  1  . aspirin EC 325 MG tablet Take 325 mg by mouth daily.      . Calcium Citrate-Vitamin D (CITRACAL + D PO) Take 2 tablets by mouth daily.      . citalopram (CELEXA) 20 MG tablet TAKE ONE TABLET BY MOUTH EVERY DAY  30 tablet  5  . citalopram (CELEXA) 40 MG tablet Take one tablet once a day for depression at bedtime  90 tablet  3  . COMBIVENT RESPIMAT 20-100 MCG/ACT AERS respimat INHALE 1 PUFF BY MOUTH EVERY 6 HOURS AS NEEDED FOR SHORTNESS OF BREATH  12 g  5  . cyclobenzaprine (FLEXERIL) 5 MG tablet Take 5 mg by mouth 3 (three) times daily as needed for muscle spasms. Take one tablet at bedtime if needed      . diltiazem (DILACOR XR) 240 MG 24 hr capsule Take one tablet once a day for blood pressure  90 capsule  3  . gemfibrozil (LOPID) 600 MG tablet Take 1 tablet 2 times  A day to help lower triglycerides and cholesterol  180 tablet  3  . HEMOCYTE-F 324-1 MG TABS TAKE 1 TABLET BY MOUTH DAILY  30 each  3  . HYDROcodone-acetaminophen (NORCO)  7.5-325 MG per tablet TAKE 1 TABLET BY MOUTH EVERY 6 HOURS AS NEEDED FOR BREAKTHROUGH PAIN  120 tablet  0  . lidocaine (LIDODERM) 5 % Place 1 patch onto the skin daily as needed. For shoulder pain.Remove & Discard patch within 12 hours or as directed by MD.      . loratadine (CLARITIN) 10 MG tablet Take 1 tablet (10 mg total) by mouth daily.  30 tablet  11  . losartan (COZAAR) 50  MG tablet TAKE 1 TABLET BY MOUTH TWICE DAILY  60 tablet  5  . Multiple Vitamins-Minerals (CENTRUM SILVER PO) Take 1 tablet by mouth daily.      Marland Kitchen NASONEX 50 MCG/ACT nasal spray USE 2 SPRAYS IN EACH NOSTRIL TWICE DAILY  17 g  6  . omeprazole (PRILOSEC) 10 MG capsule TAKE 1 CAPSULE BY MOUTH DAILY  30 capsule  5  . potassium chloride (K-DUR) 10 MEQ tablet Take 10 mEq by mouth 2 (two) times daily. Take one tablet once a day for potassium supplement      . senna (SENOKOT) 8.6 MG TABS Take 1 tablet by mouth at bedtime and may repeat dose one time if needed. For constipation.      . Simethicone (GAS-X EXTRA STRENGTH PO) Take 1 tablet by mouth at bedtime as needed. As needed for gas.      . traMADol (ULTRAM) 50 MG tablet TAKE 1 TABLET BY MOUTH THREE TIMES A DAY AS NEEDED FOR PAIN  90 tablet  1  . triamcinolone ointment (KENALOG) 0.1 % APPLY TOPICALLY TWICE DAILY  60 g  5  . triamterene-hydrochlorothiazide (MAXZIDE-25) 37.5-25 MG per tablet TAKE 1 TABLET BY MOUTH EVERY DAY TO CONTROL BLOOD PRESSURE  30 tablet  5  . diphenhydrAMINE (BENADRYL) 50 MG tablet Take 1 tablet (50 mg total) by mouth as directed. 1 hour before CT scan  1 tablet  0  . predniSONE (DELTASONE) 50 MG tablet Take 50mg  13 hours, 7 hours, and 1 hour before CT scan.  3 tablet  0  . saccharomyces boulardii (FLORASTOR) 250 MG capsule Take 1 capsule (250 mg total) by mouth 2 (two) times daily.  30 capsule  0  . [DISCONTINUED] calcium carbonate 200 MG capsule Take 250 mg by mouth daily. Does not take everyday       No current facility-administered medications for this  visit.   Facility-Administered Medications Ordered in Other Visits  Medication Dose Route Frequency Provider Last Rate Last Dose  . hyaluronate sodium (RADIAPLEXRX) gel   Topical Once Lurline Hare, MD      . topical emolient (BIAFINE) emulsion   Topical PRN Lurline Hare, MD        SURGICAL HISTORY:  Past Surgical History  Procedure Laterality Date  . Lung lobectomy  1986    left  . Septoplasty    . Total hip arthroplasty  01/2010    left  . Bronchoscopy  02/2011  . Dilation and curettage of uterus  1980's  . Tubal ligation  1980's  . Cardiac catheterization    . Fetal blood transfusion  09/16/11  . Joint replacement  04/13/10    Left Hip    REVIEW OF SYSTEMS:  A comprehensive review of systems was negative except for: Respiratory: positive for dyspnea on exertion and pleurisy/chest pain   PHYSICAL EXAMINATION: General appearance: alert, cooperative and no distress Head: Normocephalic, without obvious abnormality, atraumatic Neck: no adenopathy, no JVD, supple, symmetrical, trachea midline and thyroid not enlarged, symmetric, no tenderness/mass/nodules Lymph nodes: Cervical, supraclavicular, and axillary nodes normal. Resp: clear to auscultation bilaterally Back: symmetric, no curvature. ROM normal. No CVA tenderness. Cardio: regular rate and rhythm, S1, S2 normal, no murmur, click, rub or gallop GI: soft, non-tender; bowel sounds normal; no masses,  no organomegaly Extremities: extremities normal, atraumatic, no cyanosis or edema  ECOG PERFORMANCE STATUS: 1 - Symptomatic but completely ambulatory  Blood pressure 157/58, pulse 74, temperature 98.1 F (36.7 C), temperature source Oral, resp. rate 20, height  5' 5.5" (1.664 m), weight 179 lb (81.194 kg), SpO2 98.00%.  LABORATORY DATA: Lab Results  Component Value Date   WBC 9.0 01/22/2013   HGB 11.2* 01/22/2013   HCT 32.7* 01/22/2013   MCV 91.4 01/22/2013   PLT 319 01/22/2013      Chemistry      Component Value  Date/Time   NA 141 01/22/2013 1142   NA 141 10/03/2012 1432   NA 138 05/23/2012 1009   NA 142 10/25/2011 1200   K 3.9 01/22/2013 1142   K 3.5 10/03/2012 1432   K 4.6 10/25/2011 1200   CL 96* 10/03/2012 1432   CL 99 07/21/2012 1125   CL 97* 10/25/2011 1200   CO2 25 01/22/2013 1142   CO2 29 10/03/2012 1432   CO2 24 10/25/2011 1200   BUN 25.5 01/22/2013 1142   BUN 19 10/03/2012 1432   BUN 22 05/23/2012 1009   BUN 15 10/25/2011 1200   CREATININE 1.2* 01/22/2013 1142   CREATININE 1.06* 10/03/2012 1432   CREATININE 0.8 10/25/2011 1200      Component Value Date/Time   CALCIUM 10.6* 01/22/2013 1142   CALCIUM 10.3* 10/03/2012 1432   CALCIUM 9.6 10/25/2011 1200   ALKPHOS 104 01/22/2013 1142   ALKPHOS 95 10/03/2012 1432   ALKPHOS 106* 10/25/2011 1200   AST 17 01/22/2013 1142   AST 12 10/03/2012 1432   AST 25 10/25/2011 1200   ALT 16 01/22/2013 1142   ALT 7 10/03/2012 1432   ALT 14 10/25/2011 1200   BILITOT 0.32 01/22/2013 1142   BILITOT 0.1 10/03/2012 1432   BILITOT 0.60 10/25/2011 1200       RADIOGRAPHIC STUDIES: Ct Chest W Contrast  01/22/2013   CLINICAL DATA:  Followup lung cancer.  EXAM: CT CHEST WITH CONTRAST  TECHNIQUE: Multidetector CT imaging of the chest was performed during intravenous contrast administration.  CONTRAST:  80mL OMNIPAQUE IOHEXOL 300 MG/ML  SOLN  COMPARISON:  07/21/2012.  FINDINGS: No pathologically enlarged mediastinal, left hilar or axillary lymph nodes. Atherosclerotic calcification of the arterial vasculature, including coronary arteries. Heart size normal. No pericardial effusion. Esophagus is mildly dilated throughout most of its course which can be seen with dysmotility.  Centrilobular emphysema. Consolidation, radiation fibrosis and volume loss are seen in the right perihilar region, with involvement of primarily the right upper lobe. A patchy area of ground-glass, in what appears to be the right middle lobe (image 23), may be slightly more prominent than on prior  examinations. No solid component. Small right pleural effusion, slightly increased. Somewhat amorphous appearing focal area of ground-glass in the left lower lobe (image 26) is unchanged. Postoperative changes of left upper lobectomy. Airway is otherwise unremarkable.  Incidental imaging of the upper abdomen reveals low attenuation throughout the liver. Stones are seen in the gallbladder. Bilateral adrenal nodules measure up to 1.0 x 1.6 cm on the left, as before. No worrisome lytic or sclerotic lesions. Slight progression of a superior endplate compression fracture involving T5. T4 vertebroplasty.  IMPRESSION: 1. Postoperative and posttreatment changes in the lungs bilaterally, as before. 2. Patchy area of ground-glass in the right hemi thorax, possibly minimally more prominent than on prior examinations. Continued attention on followup exams is warranted. 3. Small right pleural effusion, slightly increased. 4. Hepatic steatosis. 5. Cholelithiasis. 6. Bilateral adrenal nodules, stable. 7. Slight progression of T5 superior endplate compression fracture.   Electronically Signed   By: Leanna Battles M.D.   On: 01/22/2013 14:55    ASSESSMENT AND PLAN: this  is a very pleasant 67 years old white female with history of stage IIB/IIIA non-small cell lung cancer status post concurrent chemoradiation followed by consolidation chemotherapy with carboplatin and Alimta and has been observation since April 2013. She has no evidence for disease progression on his recent scan. I discussed the scan results with the patient and recommended for her to continue on observation for now. I would see her back for follow up visit in 6 months with repeat CT scan of the chest. She was advised to call immediately if she has any concerning symptoms in the interval.  The patient voices understanding of current disease status and treatment options and is in agreement with the current care plan.  All questions were answered. The patient  knows to call the clinic with any problems, questions or concerns. We can certainly see the patient much sooner if necessary.

## 2013-02-07 ENCOUNTER — Encounter: Payer: Self-pay | Admitting: Emergency Medicine

## 2013-02-07 ENCOUNTER — Other Ambulatory Visit: Payer: Self-pay | Admitting: Internal Medicine

## 2013-02-07 ENCOUNTER — Ambulatory Visit: Payer: Medicare Other | Admitting: Internal Medicine

## 2013-02-08 ENCOUNTER — Other Ambulatory Visit: Payer: Self-pay | Admitting: Internal Medicine

## 2013-02-09 ENCOUNTER — Telehealth: Payer: Self-pay | Admitting: Internal Medicine

## 2013-02-09 ENCOUNTER — Ambulatory Visit: Payer: Medicare Other | Admitting: Emergency Medicine

## 2013-02-09 NOTE — Telephone Encounter (Signed)
lvm for pt regarding to April 2014 appt....mailed pt avs and letter

## 2013-02-21 ENCOUNTER — Ambulatory Visit (INDEPENDENT_AMBULATORY_CARE_PROVIDER_SITE_OTHER): Payer: Medicare Other | Admitting: Internal Medicine

## 2013-02-21 ENCOUNTER — Encounter: Payer: Self-pay | Admitting: Internal Medicine

## 2013-02-21 VITALS — BP 126/60 | HR 90 | Temp 98.0°F | Resp 16 | Wt 178.0 lb

## 2013-02-21 DIAGNOSIS — J209 Acute bronchitis, unspecified: Secondary | ICD-10-CM | POA: Insufficient documentation

## 2013-02-21 DIAGNOSIS — J329 Chronic sinusitis, unspecified: Secondary | ICD-10-CM

## 2013-02-21 DIAGNOSIS — J440 Chronic obstructive pulmonary disease with acute lower respiratory infection: Secondary | ICD-10-CM

## 2013-02-21 MED ORDER — AMOXICILLIN-POT CLAVULANATE 875-125 MG PO TABS: 1.0000 | ORAL_TABLET | Freq: Two times a day (BID) | ORAL | Status: DC

## 2013-02-21 MED ORDER — IPRATROPIUM-ALBUTEROL 18-103 MCG/ACT IN AERO: INHALATION_SPRAY | RESPIRATORY_TRACT | Status: DC

## 2013-02-21 MED ORDER — IPRATROPIUM-ALBUTEROL 0.5-2.5 (3) MG/3ML IN SOLN: 3.0000 mL | Freq: Four times a day (QID) | RESPIRATORY_TRACT | Status: DC

## 2013-02-21 MED ORDER — HYDROCODONE-ACETAMINOPHEN 7.5-325 MG PO TABS: ORAL_TABLET | ORAL | Status: DC

## 2013-02-21 MED ORDER — TRAMADOL HCL 50 MG PO TABS: ORAL_TABLET | ORAL | Status: DC

## 2013-02-21 NOTE — Progress Notes (Signed)
Patient ID: [name], female   DOB: 1946-01-04, 67 y.o.   MRN: [id number]  Chief Complaint  Patient presents with  . Follow-up  . Nasal Congestion   Allergies  Allergen Reactions  . Iohexol Shortness Of Breath and Other (See Comments)    "burns me up inside"    Pt does fine with 13 hour prep  01/17/12  . Other     CAN'T STAND THE SMELL OF PURE BLEACH; "HAVE TO BACK UP AND POUR AND DON'T BREATH IT TIL i GET CAP BACK ON; DILUTE IT TO SPRAY"  . Codeine Itching and Nausea And Vomiting    REACTION: GI upset   HPI 67 y/o female patient is here for her routine visit. She has been having runny nose with congestion in her chest and sinuses for a week now Bouts of coughing- had to use her oxygen, inhaler, chlorseptic spray and this helped her some. She is bringing up yellowish white phlegm. She feels sore in her chest and congested in her chest.  Has runny nose with yellow mucus Her sinus and head feels congested She is feeling feverish and has chills   Review of Systems No nausea or vomiting No headaches or blurry vision No edema No urinary complaints  Past Medical History  Diagnosis Date  . 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 Restless legs syndrome (RLS)   . Chronic sinusitis   . Hypertension   . Hyperlipidemia   . Anemia, unspecified   . GERD (gastroesophageal reflux disease)   . Respiratory failure with hypoxia 01/02/2010  . Complication of anesthesia     slow to wake up  . Asthma   . Coronary artery disease     Dr. Excell Seltzer had stress test done  . Type II or unspecified type diabetes mellitus without mention of complication, not stated as uncontrolled   . Peripheral vascular disease   . PAD (peripheral artery disease)   . Heart murmur   . Dysrhythmia     irregular  . Anginal pain   . History of blood clots     "2 in my aorta"  . Hard of hearing, right   . History of bronchitis   . Bronchial pneumonia     history  . COPD (chronic obstructive pulmonary disease)    . Emphysema   . Shortness of breath     "anytime really"  . Syncope and collapse   . Adenocarcinoma, lung     upper left lobe  . 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 Chemotherapy adverse reaction 09/15/11    "makes me light headed and pass out; this is 3rd blood transfusion since going thru it"  . History of blood transfusion   . Kidney infection     "related to chemo"  . Sinus headache     "all the time"  . Arthritis     "all over my body"  . Fibromyalgia   . Osteoporosis   . DDD (degenerative disc disease), lumbar   . Scoliosis   . Anxiety   . Mental disorder   . Reflux   . CHF (congestive heart failure)   . DVT (deep venous thrombosis)   . Hx of radiation therapy 04/28/11 -06/08/11    right upper lobe-lung  . Acute upper respiratory infections of unspecified site   . Pain in thoracic spine   . Asymptomatic varicose veins   . Functional urinary incontinence   . Diseases of  lips   . Other abnormal blood chemistry   . Effects of radiation, unspecified   . Anemia in neoplastic disease   . Muscle weakness (generalized)   . Dizziness and giddiness   . Spasm of muscle   . Unspecified vitamin D deficiency   . Hyperpotassemia   . Acute posthemorrhagic anemia   . Unspecified hearing loss   . Disorder of bone and cartilage, unspecified   . Screening for thyroid disorder   . Malignant neoplasm of bronchus and lung, unspecified site   . Hypopotassemia   . Atherosclerosis of native arteries of the extremities, unspecified   . Reflux esophagitis   . Unspecified constipation   . Type II or unspecified type diabetes mellitus with neurological manifestations, uncontrolled(250.62)   . Unspecified hereditary and idiopathic peripheral neuropathy   . Urinary tract infection, site not specified   . Muscle weakness (generalized)   . Acute sinusitis, unspecified   . Herpes zoster without mention of complication   . Carcinoma in situ of bronchus  and lung   . Type II or unspecified type diabetes mellitus without mention of complication, not stated as uncontrolled   . Other and unspecified hyperlipidemia   . Anxiety state, unspecified   . Depressive disorder, not elsewhere classified   . Unspecified essential hypertension   . Atrial fibrillation   . Chronic airway obstruction, not elsewhere classified   . Osteoarthrosis, unspecified whether generalized or localized, unspecified site   . Lumbago    Current Outpatient Prescriptions on File Prior to Visit  Medication Sig Dispense Refill  . ALPRAZolam (XANAX) 1 MG tablet TAKE 1 TABLET BY MOUTH THREE TIMES A DAY AS NEEDED FOR ANXIETY  90 tablet  1  . AMBULATORY NON FORMULARY MEDICATION Lighter Weight Rollator with Padded Seat and Hand Brakes Dx: 496, 729.1, 722.52  1 each  0  . aspirin EC 325 MG tablet Take 325 mg by mouth daily.      . Calcium Citrate-Vitamin D (CITRACAL + D PO) Take 2 tablets by mouth daily.      . citalopram (CELEXA) 20 MG tablet TAKE ONE TABLET BY MOUTH EVERY DAY  30 tablet  5  . citalopram (CELEXA) 40 MG tablet Take one tablet once a day for depression at bedtime  90 tablet  3  . COMBIVENT RESPIMAT 20-100 MCG/ACT AERS respimat INHALE 1 PUFF BY MOUTH EVERY 6 HOURS AS NEEDED FOR SHORTNESS OF BREATH  12 g  5  . cyclobenzaprine (FLEXERIL) 5 MG tablet Take 5 mg by mouth 3 (three) times daily as needed for muscle spasms. Take one tablet at bedtime if needed      . diltiazem (DILACOR XR) 240 MG 24 hr capsule Take one tablet once a day for blood pressure  90 capsule  3  . diphenhydrAMINE (BENADRYL) 50 MG tablet Take 1 tablet (50 mg total) by mouth as directed. 1 hour before CT scan  1 tablet  0  . gemfibrozil (LOPID) 600 MG tablet Take 1 tablet 2 times  A day to help lower triglycerides and cholesterol  180 tablet  3  . HEMOCYTE-F 324-1 MG TABS TAKE 1 TABLET BY MOUTH DAILY  30 each  3  . lidocaine (LIDODERM) 5 % Place 1 patch onto the skin daily as needed. For shoulder  pain.Remove & Discard patch within 12 hours or as directed by MD.      . loratadine (CLARITIN) 10 MG tablet Take 1 tablet (10 mg total) by mouth daily.  30  tablet  11  . losartan (COZAAR) 50 MG tablet TAKE 1 TABLET BY MOUTH TWICE DAILY  60 tablet  5  . Multiple Vitamins-Minerals (CENTRUM SILVER PO) Take 1 tablet by mouth daily.      Marland Kitchen NASONEX 50 MCG/ACT nasal spray USE 2 SPRAYS IN EACH NOSTRIL TWICE DAILY  17 g  6  . omeprazole (PRILOSEC) 10 MG capsule TAKE 1 CAPSULE BY MOUTH DAILY  30 capsule  5  . potassium chloride (K-DUR) 10 MEQ tablet Take 10 mEq by mouth 2 (two) times daily. Take one tablet once a day for potassium supplement      . predniSONE (DELTASONE) 50 MG tablet Take 50mg  13 hours, 7 hours, and 1 hour before CT scan.  3 tablet  0  . saccharomyces boulardii (FLORASTOR) 250 MG capsule Take 1 capsule (250 mg total) by mouth 2 (two) times daily.  30 capsule  0  . senna (SENOKOT) 8.6 MG TABS Take 1 tablet by mouth at bedtime and may repeat dose one time if needed. For constipation.      . Simethicone (GAS-X EXTRA STRENGTH PO) Take 1 tablet by mouth at bedtime as needed. As needed for gas.      . triamcinolone ointment (KENALOG) 0.1 % RX was sent on 01-10-2013 with 5 refills  60 g  5  . triamterene-hydrochlorothiazide (MAXZIDE-25) 37.5-25 MG per tablet TAKE 1 TABLET BY MOUTH EVERY DAY TO CONTROL BLOOD PRESSURE  30 tablet  5  . [DISCONTINUED] calcium carbonate 200 MG capsule Take 250 mg by mouth daily. Does not take everyday       Current Facility-Administered Medications on File Prior to Visit  Medication Dose Route Frequency Provider Last Rate Last Dose  . hyaluronate sodium (RADIAPLEXRX) gel   Topical Once Lurline Hare, MD      . topical emolient (BIAFINE) emulsion   Topical PRN Lurline Hare, MD       Physical exam  BP 126/60  Pulse 90  Temp(Src) 98 F (36.7 C) (Oral)  Resp 16  Wt 178 lb (80.74 kg)  SpO2 96%  gen- elderly female patient in NAD, on o2 by nasal canula heent-  no pallor, no icterus, no LAD, MMM, no oropharyngeal erythema, has swollen nasal mucosa and tender frontal and maxillary sinus cvs- normal s1, s2 respi- b/l decreased air entry at bases, mild expiratory wheezing Ext- able to move all 4 abdo- epigastric discomfort on palpation, no guarding or rigidity  Assessment/plan  Sinusitis- will start her augmentin bid for 10 days, monitor her symptoms  Acute bronchitis- with hx of copd, lung cancer and pleural effusion seen on her last ct chest, not dyspneic but her cough and chest congestion has been bothersome for the patient. augmentin should cover for acute bronchitis as well and will have her take duoneb for now instead of combivent for next 5 days. Reassess if no improvement or worsening of symptoms. Consider cxr if no improvement to assess for both infiltrates and worsening effusion

## 2013-02-26 ENCOUNTER — Telehealth: Payer: Self-pay | Admitting: *Deleted

## 2013-02-26 NOTE — Telephone Encounter (Signed)
Patient called and stated that Dr. Glade Lloyd wanted her to call today and let her know how she was doing. Patient stated that the antibiotic is helping her but would like to know if she can take Mucinex DM to help with the congestion.  *Printed and given to Wyatt Portela to respond.

## 2013-02-27 NOTE — Telephone Encounter (Signed)
Per Jenetta Downer ok to do and follow box directions. Patient Notified.

## 2013-03-02 ENCOUNTER — Telehealth: Payer: Self-pay

## 2013-03-02 MED ORDER — AMOXICILLIN-POT CLAVULANATE 875-125 MG PO TABS: 1.0000 | ORAL_TABLET | Freq: Two times a day (BID) | ORAL | Status: DC

## 2013-03-02 NOTE — Telephone Encounter (Signed)
Patient's called back indicating she is only slightly better from OV on 02/21/2013. Patient still with chest congestion. Patient would like to know if 1 more round of ABX can be called in. Patient aware Dr.Pandey is out of the office, will send to covering provider.  I called and spoke with Wyatt Portela, NP. Per Eunice Blase rx 4 more days of Augmentin (#8 pills), patient needs to be seen on Monday if no better, if any fever patient to be seen in ER.    Patient aware of response from NP. RX sent, patient will call if no improvement after 4 additional days. Patient will seek medical attention at ER if fever.

## 2013-03-04 NOTE — Telephone Encounter (Signed)
Noted and agree. 

## 2013-03-05 ENCOUNTER — Telehealth: Payer: Self-pay

## 2013-03-05 DIAGNOSIS — J440 Chronic obstructive pulmonary disease with acute lower respiratory infection: Secondary | ICD-10-CM

## 2013-03-05 DIAGNOSIS — J209 Acute bronchitis, unspecified: Secondary | ICD-10-CM

## 2013-03-05 NOTE — Telephone Encounter (Signed)
Patient needs a nebulizer machine ([facility]) and would also like a blood sugar machine sent to [facility] on file. 1.) Please advise if ok to give order for nebulizer- please provide diagnosis 2.) Please advise on how many times patient should test blood sugar daily

## 2013-03-06 NOTE — Telephone Encounter (Signed)
491.22 copd with acute bronchitis is diagnosis for nebulizer. Ok to provide this  Last a1c suggestive of diet controlled diabetes. Will not need a glucometer at present. Not on any diabetes medication

## 2013-03-06 NOTE — Telephone Encounter (Signed)
Left message on VM informing patient of Dr.Pandey's response. RX for Nebulizer sent to [facility]

## 2013-03-12 DIAGNOSIS — H2510 Age-related nuclear cataract, unspecified eye: Secondary | ICD-10-CM | POA: Diagnosis not present

## 2013-03-12 DIAGNOSIS — H269 Unspecified cataract: Secondary | ICD-10-CM | POA: Diagnosis not present

## 2013-03-12 DIAGNOSIS — H25019 Cortical age-related cataract, unspecified eye: Secondary | ICD-10-CM | POA: Diagnosis not present

## 2013-03-12 HISTORY — PX: CATARACT EXTRACTION: SUR2

## 2013-03-13 DIAGNOSIS — H2510 Age-related nuclear cataract, unspecified eye: Secondary | ICD-10-CM | POA: Diagnosis not present

## 2013-03-13 DIAGNOSIS — H25019 Cortical age-related cataract, unspecified eye: Secondary | ICD-10-CM | POA: Diagnosis not present

## 2013-03-22 ENCOUNTER — Ambulatory Visit: Payer: Medicare Other | Admitting: Emergency Medicine

## 2013-03-27 ENCOUNTER — Encounter: Payer: Self-pay | Admitting: Internal Medicine

## 2013-03-27 ENCOUNTER — Ambulatory Visit
Admission: RE | Admit: 2013-03-27 | Discharge: 2013-03-27 | Disposition: A | Discharge status: Home / Self Care | Payer: Medicare Other | Source: Ambulatory Visit | Attending: Internal Medicine | Admitting: Internal Medicine

## 2013-03-27 ENCOUNTER — Ambulatory Visit (INDEPENDENT_AMBULATORY_CARE_PROVIDER_SITE_OTHER): Payer: Medicare Other | Admitting: Internal Medicine

## 2013-03-27 VITALS — BP 134/66 | HR 80 | Temp 99.5°F | Resp 12 | Wt 181.0 lb

## 2013-03-27 DIAGNOSIS — M4310 Spondylolisthesis, site unspecified: Secondary | ICD-10-CM | POA: Diagnosis not present

## 2013-03-27 DIAGNOSIS — Z23 Encounter for immunization: Secondary | ICD-10-CM | POA: Diagnosis not present

## 2013-03-27 DIAGNOSIS — M5136 Other intervertebral disc degeneration, lumbar region: Secondary | ICD-10-CM

## 2013-03-27 DIAGNOSIS — M542 Cervicalgia: Secondary | ICD-10-CM | POA: Diagnosis not present

## 2013-03-27 DIAGNOSIS — K219 Gastro-esophageal reflux disease without esophagitis: Secondary | ICD-10-CM

## 2013-03-27 DIAGNOSIS — R109 Unspecified abdominal pain: Secondary | ICD-10-CM | POA: Diagnosis not present

## 2013-03-27 DIAGNOSIS — M51369 Other intervertebral disc degeneration, lumbar region without mention of lumbar back pain or lower extremity pain: Secondary | ICD-10-CM

## 2013-03-27 DIAGNOSIS — M5137 Other intervertebral disc degeneration, lumbosacral region: Secondary | ICD-10-CM

## 2013-03-27 DIAGNOSIS — M47817 Spondylosis without myelopathy or radiculopathy, lumbosacral region: Secondary | ICD-10-CM | POA: Diagnosis not present

## 2013-03-27 DIAGNOSIS — N3000 Acute cystitis without hematuria: Secondary | ICD-10-CM | POA: Diagnosis not present

## 2013-03-27 DIAGNOSIS — J4489 Other specified chronic obstructive pulmonary disease: Secondary | ICD-10-CM

## 2013-03-27 DIAGNOSIS — J449 Chronic obstructive pulmonary disease, unspecified: Secondary | ICD-10-CM

## 2013-03-27 DIAGNOSIS — E876 Hypokalemia: Secondary | ICD-10-CM

## 2013-03-27 LAB — POCT URINALYSIS DIPSTICK
Bilirubin, UA: NEGATIVE
Glucose, UA: NEGATIVE
Ketones, UA: NEGATIVE

## 2013-03-27 MED ORDER — TRAMADOL HCL 50 MG PO TABS: ORAL_TABLET | ORAL | Status: DC

## 2013-03-27 MED ORDER — FLUCONAZOLE 150 MG PO TABS: 150.0000 mg | ORAL_TABLET | Freq: Once | ORAL | Status: DC

## 2013-03-27 MED ORDER — HYDROCODONE-ACETAMINOPHEN 7.5-325 MG PO TABS: ORAL_TABLET | ORAL | Status: DC

## 2013-03-27 MED ORDER — SULFAMETHOXAZOLE-TMP DS 800-160 MG PO TABS: 1.0000 | ORAL_TABLET | Freq: Two times a day (BID) | ORAL | Status: DC

## 2013-03-27 MED ORDER — ALPRAZOLAM 1 MG PO TABS: 1.0000 mg | ORAL_TABLET | Freq: Three times a day (TID) | ORAL | Status: DC | PRN

## 2013-03-27 MED ORDER — LIDOCAINE 5 % EX PTCH: 1.0000 | MEDICATED_PATCH | Freq: Every day | CUTANEOUS | Status: DC | PRN

## 2013-03-27 NOTE — Progress Notes (Signed)
Patient ID: [name], female   DOB: 08-08-1945, 67 y.o.   MRN: [id number]  Chief Complaint  Patient presents with  . Medical Managment of Chronic Issues    1 month follow-up   . Urinary Concerns    HPI 67 y/o female patient is here for follow up. She recently lost her youngest daughter on 03/14/13 and pt is tearful this visit She is having burning and pain with urination. She has also noticed some blood in her urine. complaints of suprapubic pain.No flank pain. No fever or chills  Continues to be on oxygen  Taking her mood and anxiety medication  Has cataract surgery of right eye 04/02/13  Review of Systems  Constitutional: Negative for fever, chills, weight loss, malaise/fatigue and diaphoresis.  HENT: Negative for congestion, hearing loss and sore throat.   Eyes: Negative for blurred vision, double vision and discharge.  Respiratory: Negative for cough, sputum production and wheezing.  has dyspnea on exertion Cardiovascular: Negative for chest pain, palpitations, orthopnea and leg swelling.  Gastrointestinal: Negative for heartburn, nausea, vomiting, diarrhea and constipation.  Genitourinary: Negative for dysuria, urgency, frequency and flank pain.  Musculoskeletal: Negative for back pain, falls, joint pain and myalgias.  Skin: Negative for itching and rash.  Neurological: Negative for dizziness, tingling, focal weakness and headaches.  Psychiatric/Behavioral: Negative for depression and memory loss. The patient is not nervous/anxious.    Past Medical History  Diagnosis Date  . 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 Restless legs syndrome (RLS)   . Chronic sinusitis   . Hypertension   . Hyperlipidemia   . Anemia, unspecified   . GERD (gastroesophageal reflux disease)   . Respiratory failure with hypoxia 01/02/2010  . Complication of anesthesia     slow to wake up  . Asthma   . Coronary artery disease     Dr. Excell Seltzer had stress test done  . Type II or unspecified  type diabetes mellitus without mention of complication, not stated as uncontrolled   . Peripheral vascular disease   . PAD (peripheral artery disease)   . Heart murmur   . Dysrhythmia     irregular  . Anginal pain   . History of blood clots     "2 in my aorta"  . Hard of hearing, right   . History of bronchitis   . Bronchial pneumonia     history  . COPD (chronic obstructive pulmonary disease)   . Emphysema   . Shortness of breath     "anytime really"  . Syncope and collapse   . Adenocarcinoma, lung     upper left lobe  . 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 Chemotherapy adverse reaction 09/15/11    "makes me light headed and pass out; this is 3rd blood transfusion since going thru it"  . History of blood transfusion   . Kidney infection     "related to chemo"  . Sinus headache     "all the time"  . Arthritis     "all over my body"  . Fibromyalgia   . Osteoporosis   . DDD (degenerative disc disease), lumbar   . Scoliosis   . Anxiety   . Mental disorder   . Reflux   . CHF (congestive heart failure)   . DVT (deep venous thrombosis)   . Hx of radiation therapy 04/28/11 -06/08/11    right upper lobe-lung  . Acute upper respiratory infections of unspecified site   .  Pain in thoracic spine   . Asymptomatic varicose veins   . Functional urinary incontinence   . Diseases of lips   . Other abnormal blood chemistry   . Effects of radiation, unspecified   . Anemia in neoplastic disease   . Muscle weakness (generalized)   . Dizziness and giddiness   . Spasm of muscle   . Unspecified vitamin D deficiency   . Hyperpotassemia   . Acute posthemorrhagic anemia   . Unspecified hearing loss   . Disorder of bone and cartilage, unspecified   . Screening for thyroid disorder   . Malignant neoplasm of bronchus and lung, unspecified site   . Hypopotassemia   . Atherosclerosis of native arteries of the extremities, unspecified   . Reflux  esophagitis   . Unspecified constipation   . Type II or unspecified type diabetes mellitus with neurological manifestations, uncontrolled(250.62)   . Unspecified hereditary and idiopathic peripheral neuropathy   . Urinary tract infection, site not specified   . Muscle weakness (generalized)   . Acute sinusitis, unspecified   . Herpes zoster without mention of complication   . 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 Type II or unspecified type diabetes mellitus without mention of complication, not stated as uncontrolled   . Other and unspecified hyperlipidemia   . Anxiety state, unspecified   . Depressive disorder, not elsewhere classified   . Unspecified essential hypertension   . Atrial fibrillation   . Chronic airway obstruction, not elsewhere classified   . Osteoarthrosis, unspecified whether generalized or localized, unspecified site   . Lumbago    Past Surgical History  Procedure Laterality Date  . Lung lobectomy  1986    left  . Septoplasty    . Total hip arthroplasty  01/2010    left  . Bronchoscopy  02/2011  . Dilation and curettage of uterus  1980's  . Tubal ligation  1980's  . Cardiac catheterization    . Fetal blood transfusion  09/16/11  . Joint replacement  04/13/10    Left Hip  . Cataract extraction  03/12/2013    Left Eye    Medication reviewed. See [facility]  Physical exam BP 134/66  Pulse 80  Temp(Src) 99.5 F (37.5 C) (Oral)  Resp 12  Wt 181 lb (82.101 kg)  SpO2 96%  General- elderly female in no acute distress Head- atraumatic, normocephalic Eyes- PERRLA, EOMI, no pallor, no icterus, no discharge Neck- no lymphadenopathy, no thyromegaly, no jugular vein distension, no carotid bruit Ears- left ear normal tympanic membrane and normal external ear canal , right ear normal tympanic membrane and normal external ear canal Chest- no chest wall deformities, no chest wall tenderness Cardiovascular- normal s1,s2, no murmurs/ rubs/ gallops Respiratory- bilateral  clear to auscultation, no wheeze, no rhonchi, no crackles Abdomen- bowel sounds present, soft, non tender, no organomegaly, no CVA tenderness Musculoskeletal- able to move all 4 extremities, cervical spine tenderness with some paravertebral tenderness  Neurological- no focal deficit, normal reflexes, normal muscle strength, normal sensation to fine touch and vibration Psychiatry- alert and oriented to person, place and time, normal mood and affect  Labs- CBC    Component Value Date/Time   WBC 9.0 01/22/2013 1142   WBC 8.1 10/03/2012 1432   WBC 10.1 05/23/2012 1009   RBC 3.58* 01/22/2013 1142   RBC 2.95* 10/03/2012 1432   RBC 3.23* 05/23/2012 1009   HGB 11.2* 01/22/2013 1142   HGB 9.1* 10/03/2012 1432   HCT 32.7* 01/22/2013 1142  HCT 26.7* 10/03/2012 1432   PLT 319 01/22/2013 1142   PLT 268 05/23/2012 1009   MCV 91.4 01/22/2013 1142   MCV 91 10/03/2012 1432   MCH 31.3 01/22/2013 1142   MCH 30.8 10/03/2012 1432   MCH 32.2 05/23/2012 1009   MCHC 34.2 01/22/2013 1142   MCHC 34.1 10/03/2012 1432   MCHC 34.8 05/23/2012 1009   RDW 12.8 01/22/2013 1142   RDW 13.2 10/03/2012 1432   RDW 12.2 05/23/2012 1009   LYMPHSABS 0.8* 01/22/2013 1142   LYMPHSABS 1.8 10/03/2012 1432   LYMPHSABS 1.8 05/23/2012 1009   MONOABS 0.1 01/22/2013 1142   MONOABS 0.8 05/23/2012 1009   EOSABS 0.0 01/22/2013 1142   EOSABS 0.1 05/23/2012 1009   BASOSABS 0.0 01/22/2013 1142   BASOSABS 0.0 10/03/2012 1432   BASOSABS 0.0 05/23/2012 1009    CMP     Component Value Date/Time   NA 141 01/22/2013 1142   NA 141 10/03/2012 1432   NA 138 05/23/2012 1009   NA 142 10/25/2011 1200   K 3.9 01/22/2013 1142   K 3.5 10/03/2012 1432   K 4.6 10/25/2011 1200   CL 96* 10/03/2012 1432   CL 99 07/21/2012 1125   CL 97* 10/25/2011 1200   CO2 25 01/22/2013 1142   CO2 29 10/03/2012 1432   CO2 24 10/25/2011 1200   GLUCOSE 198* 01/22/2013 1142   GLUCOSE 123* 10/03/2012 1432   GLUCOSE 184* 07/21/2012 1125   GLUCOSE 100* 05/23/2012 1009   GLUCOSE  167* 10/25/2011 1200   BUN 25.5 01/22/2013 1142   BUN 19 10/03/2012 1432   BUN 22 05/23/2012 1009   BUN 15 10/25/2011 1200   CREATININE 1.2* 01/22/2013 1142   CREATININE 1.06* 10/03/2012 1432   CREATININE 0.8 10/25/2011 1200   CALCIUM 10.6* 01/22/2013 1142   CALCIUM 10.3* 10/03/2012 1432   CALCIUM 9.6 10/25/2011 1200   PROT 7.6 01/22/2013 1142   PROT 6.1 10/03/2012 1432   PROT 7.5 10/25/2011 1200   PROT 6.0 08/15/2011 0512   ALBUMIN 3.9 01/22/2013 1142   ALBUMIN 2.5* 08/15/2011 0512   AST 17 01/22/2013 1142   AST 12 10/03/2012 1432   AST 25 10/25/2011 1200   ALT 16 01/22/2013 1142   ALT 7 10/03/2012 1432   ALT 14 10/25/2011 1200   ALKPHOS 104 01/22/2013 1142   ALKPHOS 95 10/03/2012 1432   ALKPHOS 106* 10/25/2011 1200   BILITOT 0.32 01/22/2013 1142   BILITOT 0.1 10/03/2012 1432   BILITOT 0.60 10/25/2011 1200   GFRNONAA 54* 10/03/2012 1432   GFRAA 62 10/03/2012 1432   Assessment/plan  Neck pain, acute With spinal tenderness on palpation. Given her hx of malignancy, frequent steroid use will get xray to rule out fracture vs mass effect. This could also be worsening of her OA - DG Cervical Spine Complete; Future - prn norco prescribed - felexeril prn for muscle spasm  Acute cystitis - POC Urinalysis Dipstick - Culture, Urine - bactrim ds 1 tab q12h for 10 days - diflucan 150 mg x 1 as pt tends to get vaginitis following antibiotic course   COPD -Continue current bronchodilator treatment  GERD (gastroesophageal reflux disease) -continue prilosec  Hypokalemia - continue potassium supplement  Need for prophylactic vaccination against Streptococcus pneumoniae (pneumococcus) - Pneumococcal polysaccharide vaccine 23-valent greater than or equal to 2yo subcutaneous/IM

## 2013-03-28 ENCOUNTER — Ambulatory Visit: Payer: Medicare Other | Admitting: Emergency Medicine

## 2013-03-28 LAB — URINE CULTURE

## 2013-04-04 ENCOUNTER — Other Ambulatory Visit: Payer: Self-pay | Admitting: Internal Medicine

## 2013-04-10 ENCOUNTER — Telehealth: Payer: Self-pay | Admitting: *Deleted

## 2013-04-10 NOTE — Telephone Encounter (Signed)
Patient states she has taken the antibiotic for 10 days. Patient's urine is bright yellow but no other symptoms. Patient wants to know if this is caused by contrast or a medication she is taking or she has a UTI? Patient also wants you to know she is having surgery on Right eye for cataracts on Monday

## 2013-04-11 NOTE — Telephone Encounter (Signed)
To drink plenty of water. If her symptoms have resolved, that means her infection has cleared away. Wish her good luck with her cataract surgery

## 2013-04-13 NOTE — Telephone Encounter (Signed)
Pt notified and states that urine is still bright yellow with no other symptoms.  She is having some leg/hip pain as well as stomach troubles and will call back to make an appointment if she doesn't get better.

## 2013-04-16 DIAGNOSIS — H2510 Age-related nuclear cataract, unspecified eye: Secondary | ICD-10-CM | POA: Diagnosis not present

## 2013-04-16 DIAGNOSIS — H269 Unspecified cataract: Secondary | ICD-10-CM | POA: Diagnosis not present

## 2013-04-19 ENCOUNTER — Ambulatory Visit: Payer: Medicare Other | Admitting: Emergency Medicine

## 2013-04-20 ENCOUNTER — Telehealth: Payer: Self-pay | Admitting: *Deleted

## 2013-04-20 NOTE — Telephone Encounter (Signed)
Per Dr Mariea Clonts, pt should bring a urine sample by on Monday.  Pt aware/agreeable.

## 2013-04-25 ENCOUNTER — Other Ambulatory Visit: Payer: Self-pay | Admitting: Internal Medicine

## 2013-04-30 ENCOUNTER — Other Ambulatory Visit: Payer: Self-pay | Admitting: *Deleted

## 2013-04-30 MED ORDER — HYDROCODONE-ACETAMINOPHEN 7.5-325 MG PO TABS: ORAL_TABLET | ORAL | Status: DC

## 2013-05-01 ENCOUNTER — Ambulatory Visit (INDEPENDENT_AMBULATORY_CARE_PROVIDER_SITE_OTHER): Payer: Medicare Other | Admitting: Emergency Medicine

## 2013-05-01 ENCOUNTER — Encounter: Payer: Self-pay | Admitting: Emergency Medicine

## 2013-05-01 VITALS — BP 142/70 | HR 109 | Ht 66.0 in | Wt 188.0 lb

## 2013-05-01 DIAGNOSIS — J449 Chronic obstructive pulmonary disease, unspecified: Secondary | ICD-10-CM | POA: Diagnosis not present

## 2013-05-01 DIAGNOSIS — J4489 Other specified chronic obstructive pulmonary disease: Secondary | ICD-10-CM

## 2013-05-01 IMAGING — XA IR DG VERTEBROPLASTY FL
1 series · 11 of 11 positions shown · non-contrast
Comparison: MRI scan of the thoracic spine of 05/03/2012.

CLINICAL DATA: Severe interscapular pain secondary to compression
fracture at T4.  History of lung carcinoma.

VERTEBROPLASTY AT T4

[Series 300: spine · 11 of 11 slices shown]
[im 1/11]
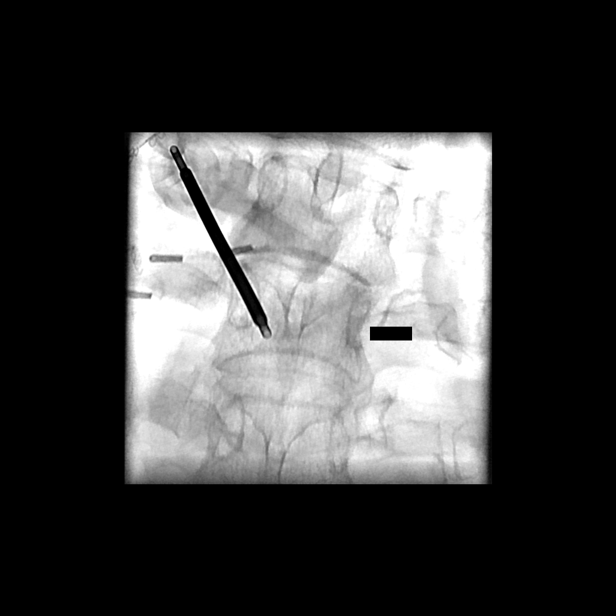
[im 2/11]
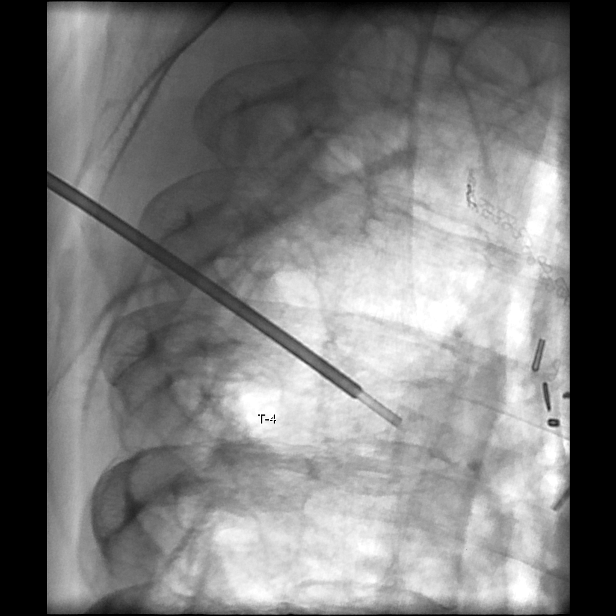
[im 3/11]
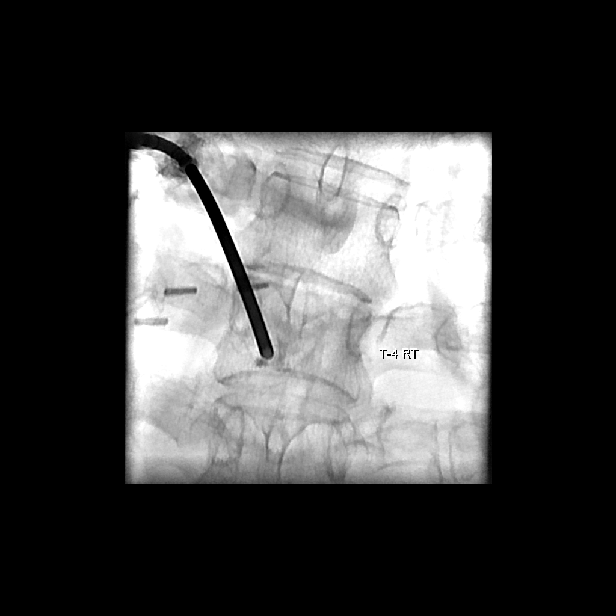
[im 4/11]
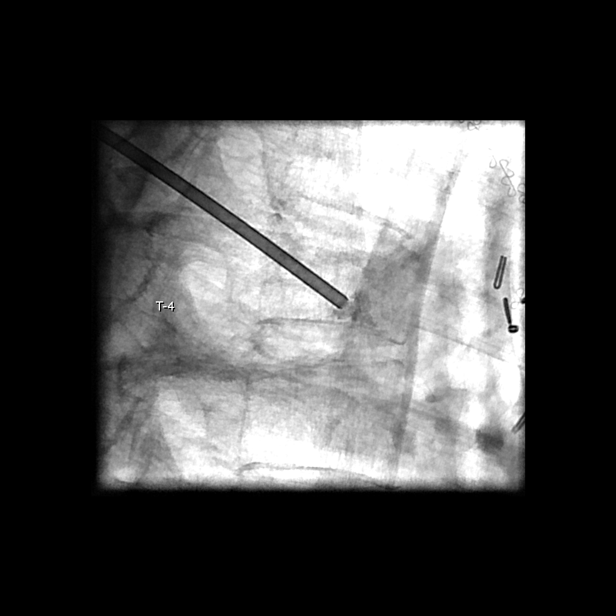
[im 5/11]
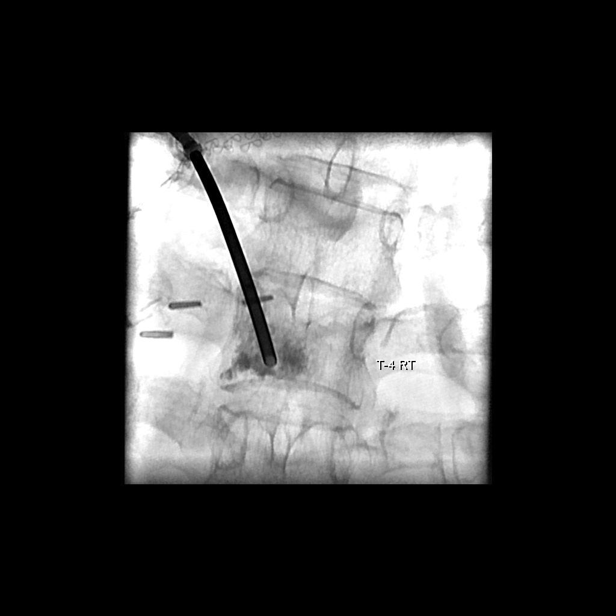
[im 6/11]
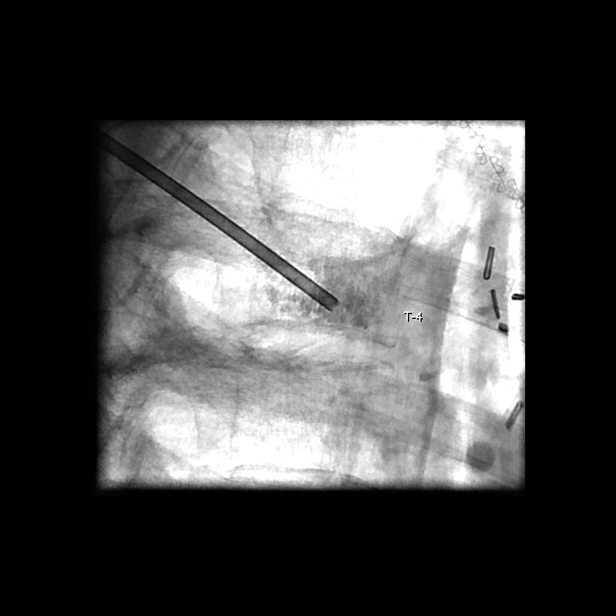
[im 7/11]
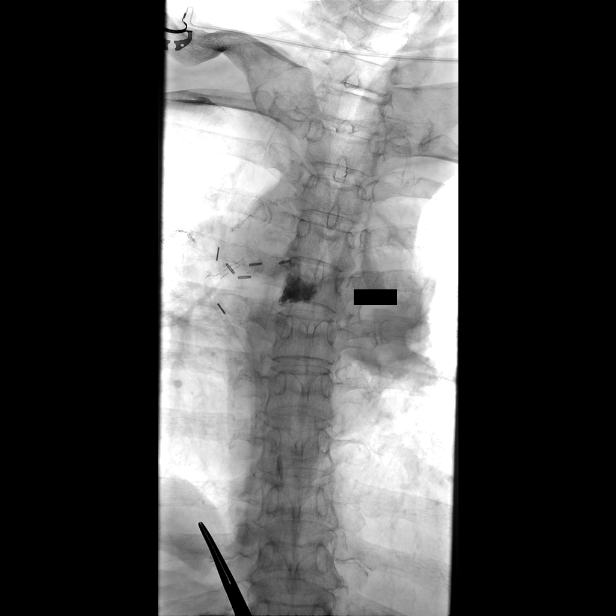
[im 8/11]
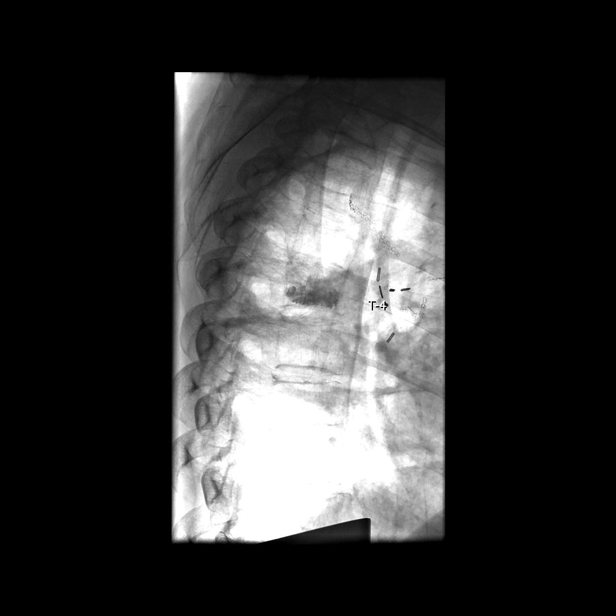
[im 9/11]
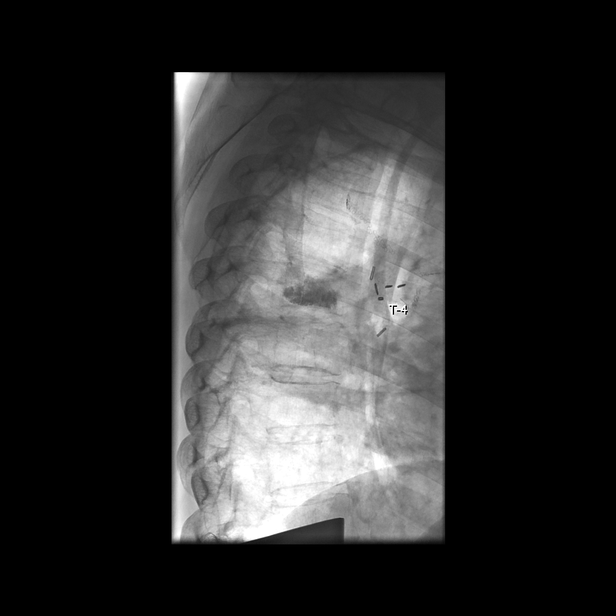
[im 10/11]
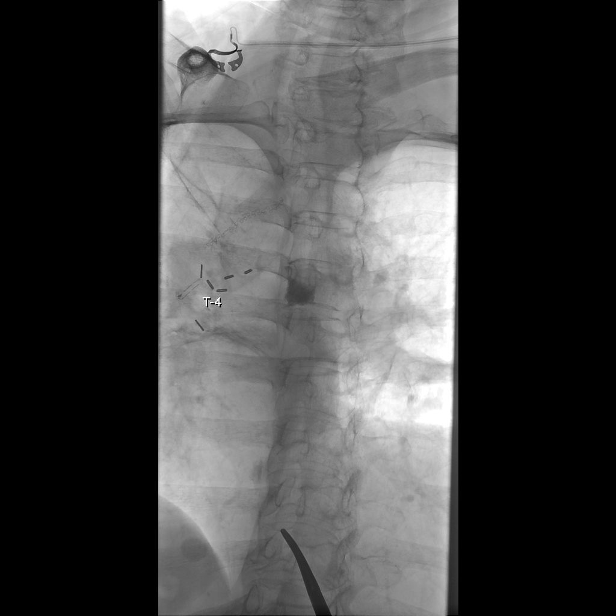
[im 11/11]
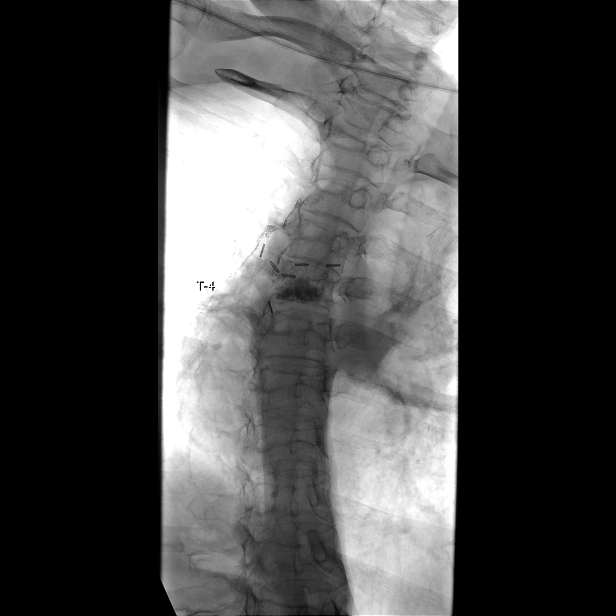

[11 of 11 positions shown; findings below may reference images not displayed]

Following a full explanation of the procedure along with the
potential associated complications, an informed witnessed consent
was obtained.

The patient was placed prone on the fluoroscopic table.

The skin overlying the thoracic region was then prepped and draped
in the usual sterile fashion.

The left pedicle at T4 was then infiltrated with 0.25% bupivacaine.
Using biplane intermittent fluoroscopy, a 13-gauge Bobsu spine
needle was then advanced into the posterior third at T4.

In a coaxial manner, a 16-gauge core biopsy needle was then
advanced through the needle.  Using a 20 ml syringe, a core biopsy
was obtained and sent for pathologic analysis.

The working needle was then advanced to the junction of the
anterior and middle thirds at T4.

At this time methylmethacrylate mixture was reconstituted with
Tobramycin in the Jiang delivery device system.

Using biplane intermittent fluoroscopy via microtubing connected to
the 13-gauge Bobsu spine needle, methylmethacrylate mixture was
injected at T4 using the Jiang delivery device system.

Excellent filling was obtained in the AP and lateral projections.
Crossing of the midline was seen.

There was no extrusion noted into disc spaces or posteriorly into
the spinal canal.  No paraspinous venous contamination was seen.

The needle was then removed.  Hemostasis was achieved at the skin
entry site.

The patient tolerated the procedure well.  There were no acute
complications.

Medications utilized: Versed 2 mg IV.  Fentanyl 25 mcg.
IMPRESSION: 1.  Status post fluoroscopic-guided needle placement for deep core
bone biopsy at T4.
2. Status post vertebral body augmentation for painful compression
fracture at T4 using the vertebroplasty technique.

## 2013-05-01 MED ORDER — PREDNISONE 10 MG PO TABS: ORAL_TABLET | ORAL | Status: DC

## 2013-05-01 NOTE — Assessment & Plan Note (Addendum)
Possible mild exacerbation although I think this is more UA cough than lower airways disease in absence of wheeze. Will treat with prednisone to see if she responds. Discussed timing an usage of her guaifenesin and combivent. She would like to explore financial assistance for the hycodan, but I do not believe there is a mechanism for this - will try to check.

## 2013-05-01 NOTE — Patient Instructions (Signed)
We will treat with prednisone. Please take until gone Continue your combivent 4x a day Wear your oxygen at all times You may use guaifenesin 1200mg  twice a day We will check to see if you can get any kind of assistance to get Hycodan cough syrup. I do not believe this is available.  Follow with Dr Lamonte Sakai in 4 months or sooner if you have any problems.

## 2013-05-01 NOTE — Progress Notes (Signed)
Subjective:    Patient ID: [name], female    DOB: 1945/08/30, 68 y.o.   MRN: [id number] HPI History of Present Illness:  [name] is a 68 y.o. 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 Has received XRT and chemo with Drs Pablo Ledger and Julien Nordmann.  She tolerated XRT completely. She has completed 3 cycles chemo.  Currently managed on combivent qid, started O2.   ROV 11/26/11 -- COPD with adenoCA 12/12 (chemo/XRT).  Presents today reporting R back and shoulder pain - is musculoskeletal, is involving R chest and R shoulder as well. She was exerting herself 3 weeks ago, believes she was sore afterwards and now with muscle pain. Dr Guinevere Ferrari gave her flexeril. [facility] Dr Hardin Negus. She is having more cough, which is exacerbating her back and shoulder pain.   ROV 03/03/12 -- COPD with adenoCA 12/12 (chemo/XRT). Currently on combivent, taking it prn. 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 Stable CT scan on 01/17/12. She is having more SOB. Was started on avelox recently for sinusitis, no other exacerbations. Marginal compliance with her O2  ROV 05/16/12 -- COPD with adenoCA 12/12. She developed URI sx, nasal obstruction, clear to yellow nasal congestion, scratchy throat. She tried restarting restarting claritin and mucinex-D a couple weeks ago. She has been using both prn - little improvement. She has a throat trickle, cough that is sometimes productive - white, yellow, clear. Using combivent twice a day for last two weeks > helps her cough.   Acute OV 12/27/12 -- COPD with adenoCA 12/12 (chemo/XRT). For last 2 weeks has had nasal drainage, low energy, cough usually non-prod, more indigestion. Was treated w Augmentin. She started mucinex DM x 2 weeks. She stopped loratadine 3 weeks ago because pressure in her head. Last CT scan was in 07/2012. Due to see Dr Julien Nordmann next month.   ROV 05/01/13 -- COPD with adenoCA  12/12 (chemo/XRT). Her last CT scan was done 01/22/13, read as stable from CA standpoint. She is still dealing with cough, dyspnea and a smothering feeling. She wears O2 prn, with most exertion, at 2.5L/min. She is using combivent about 4x a day.       Objective:   Physical Exam Filed Vitals:   05/01/13 1142  BP: 142/70  Pulse: 109  Height: 5\' 6"  (1.676 m)  Weight: 188 lb (85.276 kg)  SpO2: 95%   Gen: Pleasant, elderly woman, in no distress,  normal affect  ENT: No lesions,  mouth clear,  oropharynx clear, no postnasal drip  Neck: No JVD, no TMG, no carotid bruits  Lungs: No use of accessory muscles, distant, no wheezes or crackles  Cardiovascular: RRR, heart sounds normal, no murmur or gallops, no peripheral edema  Musculoskeletal: No deformities, no cyanosis or clubbing  Neuro: alert, non focal  Skin: Warm, no lesions or rashes   07/25/12 --  Comparison: CT chest dated 04/24/2012. MRI thoracic spine dated  05/03/2012.  Findings: Prior left upper lobectomy.  Stable posterior right upper lobe/paramediastinal lung  consolidation (series 2/image 22), likely reflecting radiation  changes. Additional radiation changes in the posterior left upper  lung (series 5/image 21).  No suspicious pulmonary nodules. Underlying moderate centrilobular  and paraseptal emphysematous changes. No pleural effusion or  pneumothorax.  Small left thyroid nodule (series 2/image 8), unchanged.  The heart is normal in size. Trace pericardial fluid. Coronary  atherosclerosis. Atherosclerotic calcifications of the  aortic  arch.  No suspicious mediastinal, hilar, or axillary lymphadenopathy.  Visualized upper abdomen is notable for a stable 11 mm left adrenal  nodule (series 2/57), previously characterized as a benign adrenal  adenoma. Cholelithiasis, without associated inflammatory changes.  Mild compression deformity at T4, corresponding to pathologic  fracture on MRI, with interval vertebral  augmentation. Early/mild  superior endplate changes at T5 (sagittal image 58).  IMPRESSION:  Prior left upper lobectomy.  Stable right upper lobe/paramediastinal radiation changes. Stable  radiation changes in the posterior left upper lung.  Mild compression deformity at T4, corresponding to known pathologic  fracture, with associated vertebral augmentation.  No evidence of new/progressive metastatic disease in the chest.       Assessment & Plan:  COPD Possible mild exacerbation although I think this is more UA cough than lower airways disease in absence of wheeze. Will treat with prednisone to see if she responds. Discussed timing an usage of her guaifenesin and combivent. She would like to explore financial assistance for the hycodan, but I do not believe there is a mechanism for this - will try to check.

## 2013-05-04 ENCOUNTER — Other Ambulatory Visit: Payer: Self-pay | Admitting: Internal Medicine

## 2013-05-07 ENCOUNTER — Telehealth: Payer: Self-pay | Admitting: *Deleted

## 2013-05-07 NOTE — Telephone Encounter (Signed)
Appointment made for patient to be evaluated

## 2013-05-07 NOTE — Telephone Encounter (Signed)
Appointment scheduled for patient to come in on Wednesday 05/09/2013 to see Dr. Bubba Camp

## 2013-05-07 NOTE — Telephone Encounter (Signed)
Prilosec is not working no more and she is having indigestion. It is getting worse and she is waking up with indigestion.

## 2013-05-09 ENCOUNTER — Encounter: Payer: Self-pay | Admitting: Internal Medicine

## 2013-05-10 ENCOUNTER — Other Ambulatory Visit: Payer: Self-pay | Admitting: *Deleted

## 2013-05-10 MED ORDER — LOSARTAN POTASSIUM 50 MG PO TABS: ORAL_TABLET | ORAL | Status: DC

## 2013-05-11 ENCOUNTER — Telehealth: Payer: Self-pay | Admitting: Emergency Medicine

## 2013-05-11 MED ORDER — AMOXICILLIN-POT CLAVULANATE 875-125 MG PO TABS: 1.0000 | ORAL_TABLET | Freq: Two times a day (BID) | ORAL | Status: DC

## 2013-05-11 NOTE — Telephone Encounter (Signed)
Called and spoke with pt. Made aware of recs. RX called in. Nothing further needed

## 2013-05-11 NOTE — Telephone Encounter (Signed)
Dc prednisone Try augmentin 875 bid x 7days

## 2013-05-11 NOTE — Telephone Encounter (Signed)
Called and spoke with pt. She reports the prednisone is not helping her this go around. She reports the prednisone is also causing her to have a lot of indigestion, gas, constipation, not sleeping. She reports she was taking protonix but did not help so she stopped. She is taking gas x at night. She is wanting RB to call something in for her since he rx's the prednisone for her. Please advise Dr. Joya Gaskins as RB is scheduled off this afternoon. thanks  Allergies  Allergen Reactions  . Iohexol Shortness Of Breath and Other (See Comments)    "burns me up inside"    Pt does fine with 13 hour prep  01/17/12  . Other     CAN'T STAND THE SMELL OF PURE BLEACH; "HAVE TO BACK UP AND POUR AND DON'T BREATH IT TIL i GET CAP BACK ON; DILUTE IT TO SPRAY"  . Codeine Itching and Nausea And Vomiting    REACTION: GI upset

## 2013-05-15 ENCOUNTER — Ambulatory Visit: Payer: Self-pay | Admitting: Internal Medicine

## 2013-05-16 ENCOUNTER — Telehealth: Payer: Self-pay | Admitting: Emergency Medicine

## 2013-05-16 MED ORDER — FLUCONAZOLE 100 MG PO TABS: ORAL_TABLET | ORAL | Status: DC

## 2013-05-16 NOTE — Telephone Encounter (Signed)
Spoke with pt. Since PW prescribed amoxicillin, pt has developed thrush and a vaginal yeast infection. She would like Magic Mouthwash and Diflucan called in to treat these issues.  RB - please advise. Thanks.

## 2013-05-16 NOTE — Telephone Encounter (Signed)
Should be able to rx diflucan alone to address both issues.  Fluconazole 200mg  on first day then 100mg  qd for 2 more days If she insists you can also call in nystatin swish and swallow x 3 days

## 2013-05-16 NOTE — Telephone Encounter (Signed)
Called and spoke with pt. Aware of recs.

## 2013-05-28 ENCOUNTER — Other Ambulatory Visit: Payer: Self-pay | Admitting: *Deleted

## 2013-05-28 MED ORDER — HYDROCODONE-ACETAMINOPHEN 7.5-325 MG PO TABS: ORAL_TABLET | ORAL | Status: DC

## 2013-05-28 NOTE — Telephone Encounter (Signed)
Refilled medication 2 days early because of bad weather

## 2013-05-29 ENCOUNTER — Ambulatory Visit: Payer: Self-pay | Admitting: Internal Medicine

## 2013-05-30 ENCOUNTER — Other Ambulatory Visit: Payer: Self-pay | Admitting: Internal Medicine

## 2013-06-13 ENCOUNTER — Ambulatory Visit: Payer: Self-pay | Admitting: Internal Medicine

## 2013-06-20 ENCOUNTER — Other Ambulatory Visit: Payer: Self-pay | Admitting: Internal Medicine

## 2013-06-25 ENCOUNTER — Other Ambulatory Visit: Payer: Self-pay | Admitting: *Deleted

## 2013-06-25 MED ORDER — HYDROCODONE-ACETAMINOPHEN 7.5-325 MG PO TABS: ORAL_TABLET | ORAL | Status: DC

## 2013-06-26 ENCOUNTER — Ambulatory Visit: Payer: Self-pay | Admitting: Internal Medicine

## 2013-06-27 ENCOUNTER — Encounter: Payer: Self-pay | Admitting: Internal Medicine

## 2013-06-27 ENCOUNTER — Ambulatory Visit (INDEPENDENT_AMBULATORY_CARE_PROVIDER_SITE_OTHER): Payer: Medicare Other | Admitting: Internal Medicine

## 2013-06-27 ENCOUNTER — Other Ambulatory Visit: Payer: Self-pay | Admitting: Internal Medicine

## 2013-06-27 VITALS — BP 126/64 | HR 82 | Temp 97.6°F | Wt 187.0 lb

## 2013-06-27 DIAGNOSIS — J449 Chronic obstructive pulmonary disease, unspecified: Secondary | ICD-10-CM

## 2013-06-27 DIAGNOSIS — K219 Gastro-esophageal reflux disease without esophagitis: Secondary | ICD-10-CM

## 2013-06-27 DIAGNOSIS — D630 Anemia in neoplastic disease: Secondary | ICD-10-CM | POA: Diagnosis not present

## 2013-06-27 DIAGNOSIS — R0609 Other forms of dyspnea: Secondary | ICD-10-CM

## 2013-06-27 DIAGNOSIS — R0600 Dyspnea, unspecified: Secondary | ICD-10-CM

## 2013-06-27 DIAGNOSIS — R0989 Other specified symptoms and signs involving the circulatory and respiratory systems: Secondary | ICD-10-CM

## 2013-06-27 DIAGNOSIS — I509 Heart failure, unspecified: Secondary | ICD-10-CM

## 2013-06-27 MED ORDER — PANTOPRAZOLE SODIUM 20 MG PO TBEC: 20.0000 mg | DELAYED_RELEASE_TABLET | Freq: Every day | ORAL | Status: DC

## 2013-06-27 NOTE — Progress Notes (Signed)
Patient ID: [name], female   DOB: 1946-01-27, 68 y.o.   MRN: [id number]    Chief Complaint  Patient presents with  . Medical Managment of Chronic Issues    3 month f/u    Allergies  Allergen Reactions  . Iohexol Shortness Of Breath and Other (See Comments)    "burns me up inside"    Pt does fine with 13 hour prep  01/17/12  . Other     CAN'T STAND THE SMELL OF PURE BLEACH; "HAVE TO BACK UP AND POUR AND DON'T BREATH IT TIL i GET CAP BACK ON; DILUTE IT TO SPRAY"  . Codeine Itching and Nausea And Vomiting    REACTION: GI upset   HPI 68 y/o female patient is here for follow up. She has history of copd, lung cancer, chf and GERD among others. She has been feeling tired and low with energy for a month now. She gets dyspneic with exertion. She has stopped her omeprazole after not getting the desired effect. denies dysphagia but has her heartburn bothering her Continues to be on oxygen Taking her mood and anxiety medication  Review of Systems  Constitutional: Negative for fever, chills, weight loss, diaphoresis.  HENT: Negative for congestion, hearing loss and sore throat.   Eyes: Negative for blurred vision, double vision and discharge.  Respiratory: Negative for cough, sputum production and wheezing.  has dyspnea on exertion Cardiovascular: Negative for chest pain, palpitations, orthopnea. Has leg swelling.  Gastrointestinal: Negative for nausea, vomiting, diarrhea and constipation.  Genitourinary: Negative for dysuria, urgency, frequency and flank pain.  Musculoskeletal: Negative for falls, joint pain and myalgias.  Skin: Negative for itching and rash.  Neurological: Negative for dizziness, tingling, focal weakness and headaches.  Psychiatric/Behavioral: Negative for depression and memory loss. The patient is not nervous/anxious.    Past Medical History  Diagnosis Date  . 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 Restless legs syndrome (RLS)   . Chronic sinusitis   .  Hypertension   . Hyperlipidemia   . Anemia, unspecified   . GERD (gastroesophageal reflux disease)   . Respiratory failure with hypoxia 01/02/2010  . Complication of anesthesia     slow to wake up  . Asthma   . Coronary artery disease     Dr. Burt Knack had stress test done  . Type II or unspecified type diabetes mellitus without mention of complication, not stated as uncontrolled   . Peripheral vascular disease   . PAD (peripheral artery disease)   . Heart murmur   . Dysrhythmia     irregular  . Anginal pain   . History of blood clots     "2 in my aorta"  . Hard of hearing, right   . History of bronchitis   . Bronchial pneumonia     history  . COPD (chronic obstructive pulmonary disease)   . Emphysema   . Shortness of breath     "anytime really"  . Syncope and collapse   . Adenocarcinoma, lung     upper left lobe  . 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 Chemotherapy adverse reaction 09/15/11    "makes me light headed and pass out; this is 3rd blood transfusion since going thru it"  . History of blood transfusion   . Kidney infection     "related to chemo"  . Sinus headache     "all the time"  . Arthritis     "all over  my body"  . Fibromyalgia   . Osteoporosis   . DDD (degenerative disc disease), lumbar   . Scoliosis   . Anxiety   . Mental disorder   . Reflux   . CHF (congestive heart failure)   . DVT (deep venous thrombosis)   . Hx of radiation therapy 04/28/11 -06/08/11    right upper lobe-lung  . Acute upper respiratory infections of unspecified site   . Pain in thoracic spine   . Asymptomatic varicose veins   . Functional urinary incontinence   . Diseases of lips   . Other abnormal blood chemistry   . Effects of radiation, unspecified   . Anemia in neoplastic disease   . Muscle weakness (generalized)   . Dizziness and giddiness   . Spasm of muscle   . Unspecified vitamin D deficiency   . Hyperpotassemia   . Acute  posthemorrhagic anemia   . Unspecified hearing loss   . Disorder of bone and cartilage, unspecified   . Screening for thyroid disorder   . Malignant neoplasm of bronchus and lung, unspecified site   . Hypopotassemia   . Atherosclerosis of native arteries of the extremities, unspecified   . Reflux esophagitis   . Unspecified constipation   . Type II or unspecified type diabetes mellitus with neurological manifestations, uncontrolled   . Unspecified hereditary and idiopathic peripheral neuropathy   . Urinary tract infection, site not specified   . Muscle weakness (generalized)   . Acute sinusitis, unspecified   . Herpes zoster without mention of complication   . 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 Type II or unspecified type diabetes mellitus without mention of complication, not stated as uncontrolled   . Other and unspecified hyperlipidemia   . Anxiety state, unspecified   . Depressive disorder, not elsewhere classified   . Unspecified essential hypertension   . Atrial fibrillation   . Chronic airway obstruction, not elsewhere classified   . Osteoarthrosis, unspecified whether generalized or localized, unspecified site   . Lumbago    Past Surgical History  Procedure Laterality Date  . Lung lobectomy  1986    left  . Septoplasty    . Total hip arthroplasty  01/2010    left  . Bronchoscopy  02/2011  . Dilation and curettage of uterus  1980's  . Tubal ligation  1980's  . Cardiac catheterization    . Fetal blood transfusion  09/16/11  . Joint replacement  04/13/10    Left Hip  . Cataract extraction  03/12/2013    Left Eye    Current Outpatient Prescriptions on File Prior to Visit  Medication Sig Dispense Refill  . albuterol-ipratropium (COMBIVENT) 18-103 MCG/ACT inhaler every 4 (four) hours as needed. 2 puffs every for hours for lung cancer      . ALPRAZolam (XANAX) 1 MG tablet TAKE 1 TABLET BY MOUTH THREE TIMES A DAY AS NEEDED FOR ANXIETY  90 tablet  1  . AMBULATORY NON  FORMULARY MEDICATION Lighter Weight Rollator with Padded Seat and Hand Brakes Dx: 496, 729.1, 722.52  1 each  0  . aspirin EC 325 MG tablet Take 325 mg by mouth daily.      . Calcium Citrate-Vitamin D (CITRACAL + D PO) Take 2 tablets by mouth daily.      . citalopram (CELEXA) 20 MG tablet TAKE ONE TABLET BY MOUTH EVERY DAY  30 tablet  5  . COMBIVENT RESPIMAT 20-100 MCG/ACT AERS respimat INHALE 1 PUFF BY MOUTH EVERY  6 HOURS AS NEEDED FOR SHORTNESS OF BREATH  12 g  5  . cyclobenzaprine (FLEXERIL) 5 MG tablet Take 5 mg by mouth 3 (three) times daily as needed for muscle spasms. Take one tablet at bedtime if needed      . diltiazem (DILACOR XR) 240 MG 24 hr capsule Take one tablet once a day for blood pressure  90 capsule  3  . gemfibrozil (LOPID) 600 MG tablet Take 1 tablet 2 times  A day to help lower triglycerides and cholesterol  180 tablet  3  . HEMOCYTE-F 324-1 MG TABS TAKE 1 TABLET BY MOUTH DAILY  30 each  3  . HYDROcodone-acetaminophen (NORCO) 7.5-325 MG per tablet Take 1 tablet by mouth every 6 hours as needed for severe pain  120 tablet  0  . lidocaine (LIDODERM) 5 % Place 1 patch onto the skin daily as needed. For shoulder pain.Remove & Discard patch within 12 hours or as directed by MD.  30 patch  0  . loratadine (CLARITIN) 10 MG tablet Take 1 tablet (10 mg total) by mouth daily.  30 tablet  11  . losartan (COZAAR) 50 MG tablet Take one tablet by mouth twice daily  60 tablet  5  . Multiple Vitamins-Minerals (CENTRUM SILVER PO) Take 1 tablet by mouth daily.      . potassium chloride (K-DUR,KLOR-CON) 10 MEQ tablet TAKE 1 TABLET BY MOUTH EVERY DAY  30 tablet  3  . senna (SENOKOT) 8.6 MG TABS Take 1 tablet by mouth at bedtime and may repeat dose one time if needed. For constipation.      . Simethicone (GAS-X EXTRA STRENGTH PO) Take 1 tablet by mouth at bedtime as needed. As needed for gas.      . traMADol (ULTRAM) 50 MG tablet TAKE 1 TABLET BY MOUTH THREE TIMES A DAY AS NEEDED FOR PAIN  90  tablet  0  . triamcinolone ointment (KENALOG) 0.1 % RX was sent on 01-10-2013 with 5 refills  60 g  5  . triamterene-hydrochlorothiazide (MAXZIDE-25) 37.5-25 MG per tablet TAKE 1 TABLET BY MOUTH EVERY DAY TO CONTROL BLOOD PRESSURE  30 tablet  5  . [DISCONTINUED] calcium carbonate 200 MG capsule Take 250 mg by mouth daily. Does not take everyday       Current Facility-Administered Medications on File Prior to Visit  Medication Dose Route Frequency Provider Last Rate Last Dose  . hyaluronate sodium (RADIAPLEXRX) gel   Topical Once Thea Silversmith, MD      . topical emolient (BIAFINE) emulsion   Topical PRN Thea Silversmith, MD       Family History  Problem Relation Age of Onset  . Bone cancer Father   . Cancer Father   . Diabetes Mother   . Deep vein thrombosis Mother   . Heart disease Mother     Heart Disease before age 64  . Hyperlipidemia Mother   . Hypertension Mother   . Arthritis Daughter    Physical exam BP 126/64  Pulse 82  Temp(Src) 97.6 F (36.4 C) (Oral)  Wt 187 lb (84.823 kg)  SpO2 97%  General- elderly female in no acute distress, obese Head- atraumatic, normocephalic Eyes- PERRLA, EOMI, no pallor, no icterus, no discharge Neck- no lymphadenopathy, no thyromegaly, no jugular vein distension, no carotid bruit Ears- left ear normal tympanic membrane and normal external ear canal , right ear normal tympanic membrane and normal external ear canal Chest- no chest wall deformities, no chest wall tenderness Cardiovascular- normal  s1,s2, no murmurs/ rubs/ gallops Respiratory- bilateral clear to auscultation, no wheeze, no rhonchi, no crackles, on o2 [place] Abdomen- bowel sounds present, soft, non tender, no organomegaly, no CVA tenderness Musculoskeletal- able to move all 4 extremities Neurological- no focal deficit, normal reflexes, normal muscle strength, normal sensation to fine touch and vibration Psychiatry- alert and oriented to person, place and time, normal mood and  affect  Labs-  Lab Results  Component Value Date   HGBA1C 5.1 09/15/2011   CMP     Component Value Date/Time   NA 141 01/22/2013 1142   NA 141 10/03/2012 1432   NA 138 05/23/2012 1009   NA 142 10/25/2011 1200   K 3.9 01/22/2013 1142   K 3.5 10/03/2012 1432   K 4.6 10/25/2011 1200   CL 96* 10/03/2012 1432   CL 99 07/21/2012 1125   CL 97* 10/25/2011 1200   CO2 25 01/22/2013 1142   CO2 29 10/03/2012 1432   CO2 24 10/25/2011 1200   GLUCOSE 198* 01/22/2013 1142   GLUCOSE 123* 10/03/2012 1432   GLUCOSE 184* 07/21/2012 1125   GLUCOSE 100* 05/23/2012 1009   GLUCOSE 167* 10/25/2011 1200   BUN 25.5 01/22/2013 1142   BUN 19 10/03/2012 1432   BUN 22 05/23/2012 1009   BUN 15 10/25/2011 1200   CREATININE 1.2* 01/22/2013 1142   CREATININE 1.06* 10/03/2012 1432   CREATININE 0.8 10/25/2011 1200   CALCIUM 10.6* 01/22/2013 1142   CALCIUM 10.3* 10/03/2012 1432   CALCIUM 9.6 10/25/2011 1200   PROT 7.6 01/22/2013 1142   PROT 6.1 10/03/2012 1432   PROT 7.5 10/25/2011 1200   PROT 6.0 08/15/2011 0512   ALBUMIN 3.9 01/22/2013 1142   ALBUMIN 2.5* 08/15/2011 0512   AST 17 01/22/2013 1142   AST 12 10/03/2012 1432   AST 25 10/25/2011 1200   ALT 16 01/22/2013 1142   ALT 7 10/03/2012 1432   ALT 14 10/25/2011 1200   ALKPHOS 104 01/22/2013 1142   ALKPHOS 95 10/03/2012 1432   ALKPHOS 106* 10/25/2011 1200   BILITOT 0.32 01/22/2013 1142   BILITOT 0.1 10/03/2012 1432   BILITOT 0.60 10/25/2011 1200   GFRNONAA 54* 10/03/2012 1432   GFRAA 62 10/03/2012 1432   CBC    Component Value Date/Time   WBC 9.0 01/22/2013 1142   WBC 8.1 10/03/2012 1432   WBC 10.1 05/23/2012 1009   RBC 3.58* 01/22/2013 1142   RBC 2.95* 10/03/2012 1432   RBC 3.23* 05/23/2012 1009   HGB 11.2* 01/22/2013 1142   HGB 9.1* 10/03/2012 1432   HCT 32.7* 01/22/2013 1142   HCT 26.7* 10/03/2012 1432   PLT 319 01/22/2013 1142   PLT 268 05/23/2012 1009   MCV 91.4 01/22/2013 1142   MCV 91 10/03/2012 1432   MCH 31.3 01/22/2013 1142   MCH 30.8 10/03/2012 1432   MCH 32.2  05/23/2012 1009   MCHC 34.2 01/22/2013 1142   MCHC 34.1 10/03/2012 1432   MCHC 34.8 05/23/2012 1009   RDW 12.8 01/22/2013 1142   RDW 13.2 10/03/2012 1432   RDW 12.2 05/23/2012 1009   LYMPHSABS 0.8* 01/22/2013 1142   LYMPHSABS 1.8 10/03/2012 1432   LYMPHSABS 1.8 05/23/2012 1009   MONOABS 0.1 01/22/2013 1142   MONOABS 0.8 05/23/2012 1009   EOSABS 0.0 01/22/2013 1142   EOSABS 0.1 05/23/2012 1009   BASOSABS 0.0 01/22/2013 1142   BASOSABS 0.0 10/03/2012 1432   BASOSABS 0.0 05/23/2012 1009    Assessment/plan 1. Esophageal reflux Will have her on pantoprazole  20 mg daily for now and reassess  2. CHF (congestive heart failure) With worsening dyspnea, normal lung exam and increased swelling, will have her BNP and BMP. Continue maxzide for now with losartan. Continue kcl supplement - CBC with Differential - CMP - Brain Natriuretic Peptide  3. COPD (chronic obstructive pulmonary disease) Stable. continue oxygen with combivent  4. Anemia in neoplastic disease This could be contributing to her fatigue. - CBC with Differential  5. Dyspnea Concern for anemia, copd and chf making it worse. Continue breathing treatment. Will evaluate further after reviewing BNP. Pt to notify if develops chest pain, worsening dyspnea - Brain Natriuretic Peptide  Labs- Bnp, bmp, cbc

## 2013-06-28 LAB — COMPREHENSIVE METABOLIC PANEL
ALT: 24 IU/L (ref 0–32)
AST: 33 IU/L (ref 0–40)
Albumin/Globulin Ratio: 2.3 (ref 1.1–2.5)
Albumin: 4.5 g/dL (ref 3.6–4.8)
Alkaline Phosphatase: 115 IU/L (ref 39–117)
BUN / CREAT RATIO: 14 (ref 11–26)
BUN: 15 mg/dL (ref 8–27)
CALCIUM: 10.1 mg/dL (ref 8.7–10.3)
CHLORIDE: 97 mmol/L (ref 97–108)
CO2: 26 mmol/L (ref 18–29)
Creatinine, Ser: 1.04 mg/dL — ABNORMAL HIGH (ref 0.57–1.00)
GFR calc non Af Amer: 55 mL/min/{1.73_m2} — ABNORMAL LOW (ref >59)
GFR, EST AFRICAN AMERICAN: 63 mL/min/{1.73_m2} (ref >59)
GLOBULIN, TOTAL: 2 g/dL (ref 1.5–4.5)
Glucose: 104 mg/dL — ABNORMAL HIGH (ref 65–99)
POTASSIUM: 3.9 mmol/L (ref 3.5–5.2)
SODIUM: 141 mmol/L (ref 134–144)
TOTAL PROTEIN: 6.5 g/dL (ref 6.0–8.5)
Total Bilirubin: 0.2 mg/dL (ref 0.0–1.2)

## 2013-06-28 LAB — CBC WITH DIFFERENTIAL/PLATELET
Basophils Absolute: 0 10*3/uL (ref 0.0–0.2)
Basos: 0 %
EOS ABS: 0.2 10*3/uL (ref 0.0–0.4)
Eos: 2 %
HEMATOCRIT: 32.7 % — AB (ref 34.0–46.6)
HEMOGLOBIN: 11.1 g/dL (ref 11.1–15.9)
IMMATURE GRANULOCYTES: 0 %
Immature Grans (Abs): 0 10*3/uL (ref 0.0–0.1)
LYMPHS ABS: 2 10*3/uL (ref 0.7–3.1)
Lymphs: 25 %
MCH: 29.8 pg (ref 26.6–33.0)
MCHC: 33.9 g/dL (ref 31.5–35.7)
MCV: 88 fL (ref 79–97)
MONOS ABS: 0.7 10*3/uL (ref 0.1–0.9)
Monocytes: 9 %
NEUTROS ABS: 5.1 10*3/uL (ref 1.4–7.0)
Neutrophils Relative %: 64 %
RBC: 3.72 x10E6/uL — ABNORMAL LOW (ref 3.77–5.28)
RDW: 13.5 % (ref 12.3–15.4)
WBC: 8 10*3/uL (ref 3.4–10.8)

## 2013-06-28 LAB — BRAIN NATRIURETIC PEPTIDE: BNP: 18.2 pg/mL (ref 0.0–100.0)

## 2013-07-19 ENCOUNTER — Other Ambulatory Visit: Payer: Self-pay | Admitting: *Deleted

## 2013-07-19 DIAGNOSIS — C3490 Malignant neoplasm of unspecified part of unspecified bronchus or lung: Secondary | ICD-10-CM

## 2013-07-19 MED ORDER — PREDNISONE 50 MG PO TABS: ORAL_TABLET | ORAL | Status: DC

## 2013-07-19 MED ORDER — DIPHENHYDRAMINE HCL 50 MG PO TABS: 50.0000 mg | ORAL_TABLET | ORAL | Status: DC

## 2013-07-23 ENCOUNTER — Telehealth: Payer: Self-pay | Admitting: Medical Oncology

## 2013-07-23 NOTE — Telephone Encounter (Signed)
Iotd pt to take benadryl 50 mg 1 hour prior to CT scan.

## 2013-07-24 ENCOUNTER — Other Ambulatory Visit (HOSPITAL_BASED_OUTPATIENT_CLINIC_OR_DEPARTMENT_OTHER): Payer: Medicare Other

## 2013-07-24 ENCOUNTER — Other Ambulatory Visit: Payer: Self-pay | Admitting: Internal Medicine

## 2013-07-24 ENCOUNTER — Ambulatory Visit (HOSPITAL_COMMUNITY)
Admission: RE | Admit: 2013-07-24 | Discharge: 2013-07-24 | Disposition: A | Discharge status: Home / Self Care | Payer: Medicare Other | Source: Ambulatory Visit | Attending: Internal Medicine | Admitting: Internal Medicine

## 2013-07-24 ENCOUNTER — Other Ambulatory Visit: Payer: Self-pay | Admitting: *Deleted

## 2013-07-24 DIAGNOSIS — I2510 Atherosclerotic heart disease of native coronary artery without angina pectoris: Secondary | ICD-10-CM | POA: Diagnosis not present

## 2013-07-24 DIAGNOSIS — K7689 Other specified diseases of liver: Secondary | ICD-10-CM | POA: Diagnosis not present

## 2013-07-24 DIAGNOSIS — C3491 Malignant neoplasm of unspecified part of right bronchus or lung: Secondary | ICD-10-CM

## 2013-07-24 DIAGNOSIS — J701 Chronic and other pulmonary manifestations due to radiation: Secondary | ICD-10-CM | POA: Diagnosis not present

## 2013-07-24 DIAGNOSIS — C3410 Malignant neoplasm of upper lobe, unspecified bronchus or lung: Secondary | ICD-10-CM

## 2013-07-24 DIAGNOSIS — M8448XA Pathological fracture, other site, initial encounter for fracture: Secondary | ICD-10-CM | POA: Insufficient documentation

## 2013-07-24 DIAGNOSIS — I700 Atherosclerosis of aorta: Secondary | ICD-10-CM | POA: Diagnosis not present

## 2013-07-24 DIAGNOSIS — Y842 Radiological procedure and radiotherapy as the cause of abnormal reaction of the patient, or of later complication, without mention of misadventure at the time of the procedure: Secondary | ICD-10-CM | POA: Insufficient documentation

## 2013-07-24 DIAGNOSIS — J438 Other emphysema: Secondary | ICD-10-CM | POA: Diagnosis not present

## 2013-07-24 DIAGNOSIS — E278 Other specified disorders of adrenal gland: Secondary | ICD-10-CM | POA: Insufficient documentation

## 2013-07-24 DIAGNOSIS — C3490 Malignant neoplasm of unspecified part of unspecified bronchus or lung: Secondary | ICD-10-CM | POA: Insufficient documentation

## 2013-07-24 LAB — CBC WITH DIFFERENTIAL/PLATELET
BASO%: 0.6 % (ref 0.0–2.0)
BASOS ABS: 0.1 10*3/uL (ref 0.0–0.1)
EOS ABS: 0 10*3/uL (ref 0.0–0.5)
EOS%: 0 % (ref 0.0–7.0)
HCT: 34.2 % — ABNORMAL LOW (ref 34.8–46.6)
HEMOGLOBIN: 11.3 g/dL — AB (ref 11.6–15.9)
LYMPH#: 1.1 10*3/uL (ref 0.9–3.3)
LYMPH%: 10.4 % — ABNORMAL LOW (ref 14.0–49.7)
MCH: 30.1 pg (ref 25.1–34.0)
MCHC: 33 g/dL (ref 31.5–36.0)
MCV: 91.4 fL (ref 79.5–101.0)
MONO#: 0.1 10*3/uL (ref 0.1–0.9)
MONO%: 0.9 % (ref 0.0–14.0)
NEUT%: 88.1 % — ABNORMAL HIGH (ref 38.4–76.8)
NEUTROS ABS: 9.5 10*3/uL — AB (ref 1.5–6.5)
Platelets: 384 10*3/uL (ref 145–400)
RBC: 3.74 10*6/uL (ref 3.70–5.45)
RDW: 13.3 % (ref 11.2–14.5)
WBC: 10.8 10*3/uL — ABNORMAL HIGH (ref 3.9–10.3)

## 2013-07-24 LAB — COMPREHENSIVE METABOLIC PANEL (CC13)
ALT: 16 U/L (ref 0–55)
ANION GAP: 16 meq/L — AB (ref 3–11)
AST: 17 U/L (ref 5–34)
Albumin: 4.3 g/dL (ref 3.5–5.0)
Alkaline Phosphatase: 104 U/L (ref 40–150)
BILIRUBIN TOTAL: 0.3 mg/dL (ref 0.20–1.20)
BUN: 19.9 mg/dL (ref 7.0–26.0)
CALCIUM: 10.6 mg/dL — AB (ref 8.4–10.4)
CO2: 23 meq/L (ref 22–29)
CREATININE: 1.3 mg/dL — AB (ref 0.6–1.1)
Chloride: 102 mEq/L (ref 98–109)
Glucose: 187 mg/dl — ABNORMAL HIGH (ref 70–140)
Potassium: 4.2 mEq/L (ref 3.5–5.1)
SODIUM: 141 meq/L (ref 136–145)
Total Protein: 7.8 g/dL (ref 6.4–8.3)

## 2013-07-24 MED ORDER — IOHEXOL 300 MG/ML  SOLN
80.0000 mL | Freq: Once | INTRAMUSCULAR | Status: AC | PRN
  Administered 2013-07-24: 80 mL via INTRAVENOUS

## 2013-07-26 ENCOUNTER — Ambulatory Visit (HOSPITAL_BASED_OUTPATIENT_CLINIC_OR_DEPARTMENT_OTHER): Payer: Medicare Other | Admitting: Internal Medicine

## 2013-07-26 ENCOUNTER — Telehealth: Payer: Self-pay | Admitting: *Deleted

## 2013-07-26 ENCOUNTER — Other Ambulatory Visit: Payer: Self-pay | Admitting: *Deleted

## 2013-07-26 ENCOUNTER — Telehealth: Payer: Self-pay | Admitting: Internal Medicine

## 2013-07-26 VITALS — BP 154/49 | HR 87 | Temp 98.1°F | Resp 19 | Ht 66.0 in | Wt 188.7 lb

## 2013-07-26 DIAGNOSIS — C3490 Malignant neoplasm of unspecified part of unspecified bronchus or lung: Secondary | ICD-10-CM

## 2013-07-26 DIAGNOSIS — C3410 Malignant neoplasm of upper lobe, unspecified bronchus or lung: Secondary | ICD-10-CM | POA: Diagnosis not present

## 2013-07-26 DIAGNOSIS — R0602 Shortness of breath: Secondary | ICD-10-CM

## 2013-07-26 MED ORDER — HYDROCODONE-ACETAMINOPHEN 7.5-325 MG PO TABS: ORAL_TABLET | ORAL | Status: DC

## 2013-07-26 NOTE — Telephone Encounter (Signed)
Gave pt appt for lab and Md October 2015

## 2013-07-26 NOTE — Telephone Encounter (Signed)
Patient called and stated that the reflux medication that Dr. Bubba Camp prescribed is not working.Protonix. Patient states that she has lung cancer and cannot keep coming back and forths to the Dr. Geraldine Solar a different medication called in. Patient states she gets up in the morning with the indigestion before she even eats anything. Please Advise.  Pharmacy: [facility] [address]

## 2013-07-26 NOTE — Progress Notes (Signed)
[place] [phone]   Fax:([phone]) [place], [place], [place] [address]  DIAGNOSIS: Stage IIB/IIIA non-small cell lung cancer consistent with adenocarcinoma diagnosed in December 2012.   PRIOR THERAPY:  1) Status post radiation therapy to the right upper lobe lung mass completed on 06/04/2011 under the care of Dr. Pablo Ledger.  2) Systemic chemotherapy with carboplatin for AUC of 5 and Alimta 500 mg/M2 giving every 3 weeks, status post 3 cycles, last dose was given on 07/21/2011.   CURRENT THERAPY: Observation.  INTERVAL HISTORY: [name] 68 y.o. female returns to the clinic today for routine six month follow up visit. The patient is feeling fine today with no specific complaints except for the baseline shortness of breath. She is currently on home oxygen. The patient denied having any significant chest pain except for occasional soreness of the right side of the chest with no cough or hemoptysis. She denied having any significant weight loss or night sweats. She had repeat CT scan of the chest performed recently and she is here for evaluation and discussion of her scan results.  MEDICAL HISTORY: Past Medical History  Diagnosis Date  . 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 Restless legs syndrome (RLS)   . Chronic sinusitis   . Hypertension   . Hyperlipidemia   . Anemia, unspecified   . GERD (gastroesophageal reflux disease)   . Respiratory failure with hypoxia 01/02/2010  . Complication of anesthesia     slow to wake up  . Asthma   . Coronary artery disease     Dr. Burt Knack had stress test done  . Type II or unspecified type diabetes mellitus without mention of complication, not stated as uncontrolled   . Peripheral vascular disease   . PAD (peripheral artery disease)   . Heart murmur   . Dysrhythmia     irregular  . Anginal pain   . History of blood clots     "2 in my aorta"  . Hard  of hearing, right   . History of bronchitis   . Bronchial pneumonia     history  . COPD (chronic obstructive pulmonary disease)   . Emphysema   . Shortness of breath     "anytime really"  . Syncope and collapse   . Adenocarcinoma, lung     upper left lobe  . 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 Chemotherapy adverse reaction 09/15/11    "makes me light headed and pass out; this is 3rd blood transfusion since going thru it"  . History of blood transfusion   . Kidney infection     "related to chemo"  . Sinus headache     "all the time"  . Arthritis     "all over my body"  . Fibromyalgia   . Osteoporosis   . DDD (degenerative disc disease), lumbar   . Scoliosis   . Anxiety   . Mental disorder   . Reflux   . CHF (congestive heart failure)   . DVT (deep venous thrombosis)   . Hx of radiation therapy 04/28/11 -06/08/11    right upper lobe-lung  . Acute upper respiratory infections of unspecified site   . Pain in thoracic spine   . Asymptomatic varicose veins   . Functional urinary incontinence   . Diseases of lips   . Other abnormal blood chemistry   .  Effects of radiation, unspecified   . Anemia in neoplastic disease   . Muscle weakness (generalized)   . Dizziness and giddiness   . Spasm of muscle   . Unspecified vitamin D deficiency   . Hyperpotassemia   . Acute posthemorrhagic anemia   . Unspecified hearing loss   . Disorder of bone and cartilage, unspecified   . Screening for thyroid disorder   . Malignant neoplasm of bronchus and lung, unspecified site   . Hypopotassemia   . Atherosclerosis of native arteries of the extremities, unspecified   . Reflux esophagitis   . Unspecified constipation   . Type II or unspecified type diabetes mellitus with neurological manifestations, uncontrolled   . Unspecified hereditary and idiopathic peripheral neuropathy   . Urinary tract infection, site not specified   . Muscle weakness  (generalized)   . Acute sinusitis, unspecified   . Herpes zoster without mention of complication   . 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 Type II or unspecified type diabetes mellitus without mention of complication, not stated as uncontrolled   . Other and unspecified hyperlipidemia   . Anxiety state, unspecified   . Depressive disorder, not elsewhere classified   . Unspecified essential hypertension   . Atrial fibrillation   . Chronic airway obstruction, not elsewhere classified   . Osteoarthrosis, unspecified whether generalized or localized, unspecified site   . Lumbago     ALLERGIES:  is allergic to iohexol; other; and codeine.  MEDICATIONS:  Current Outpatient Prescriptions  Medication Sig Dispense Refill  . albuterol-ipratropium (COMBIVENT) 18-103 MCG/ACT inhaler every 4 (four) hours as needed. 2 puffs every for hours for lung cancer      . ALPRAZolam (XANAX) 1 MG tablet TAKE 1 TABLET BY MOUTH THREE TIMES A DAY AS NEEDED FOR ANIETY  90 tablet  0  . AMBULATORY NON FORMULARY MEDICATION Lighter Weight Rollator with Padded Seat and Hand Brakes Dx: 496, 729.1, 722.52  1 each  0  . aspirin EC 325 MG tablet Take 325 mg by mouth daily.      . Calcium Citrate-Vitamin D (CITRACAL + D PO) Take 2 tablets by mouth daily.      . citalopram (CELEXA) 20 MG tablet TAKE ONE TABLET BY MOUTH EVERY DAY  30 tablet  5  . COMBIVENT RESPIMAT 20-100 MCG/ACT AERS respimat INHALE 1 PUFF BY MOUTH EVERY 6 HOURS AS NEEDED FOR SHORTNESS OF BREATH  12 g  5  . cyclobenzaprine (FLEXERIL) 5 MG tablet Take 5 mg by mouth 3 (three) times daily as needed for muscle spasms. Take one tablet at bedtime if needed      . diltiazem (CARDIZEM CD) 240 MG 24 hr capsule TAKE 1 CAPSULE BY MOUTH EVERY DAY FOR BLOOD PRESSURE  90 capsule  0  . diltiazem (DILACOR XR) 240 MG 24 hr capsule Take one tablet once a day for blood pressure  90 capsule  3  . diphenhydrAMINE (BENADRYL) 50 MG tablet Take 1 tablet (50 mg total) by mouth  as directed. 1 hour before CT scan  1 tablet  0  . gemfibrozil (LOPID) 600 MG tablet TAKE 1 TABLET BY MOUTH TWICE DAILY TO HELP LOWER TRIGLYCERIDES AND CHOLESTEROL  360 tablet  0  . HEMOCYTE-F 324-1 MG TABS TAKE 1 TABLET BY MOUTH DAILY  30 each  3  . HYDROcodone-acetaminophen (NORCO) 7.5-325 MG per tablet Take 1 tablet by mouth every 6 hours as needed for severe pain  120 tablet  0  .  lidocaine (LIDODERM) 5 % Place 1 patch onto the skin daily as needed. For shoulder pain.Remove & Discard patch within 12 hours or as directed by MD.  30 patch  0  . loratadine (CLARITIN) 10 MG tablet Take 1 tablet (10 mg total) by mouth daily.  30 tablet  11  . losartan (COZAAR) 50 MG tablet Take one tablet by mouth twice daily  60 tablet  5  . Multiple Vitamins-Minerals (CENTRUM SILVER PO) Take 1 tablet by mouth daily.      . pantoprazole (PROTONIX) 20 MG tablet Take 1 tablet (20 mg total) by mouth daily.  30 tablet  3  . potassium chloride (K-DUR,KLOR-CON) 10 MEQ tablet TAKE 1 TABLET BY MOUTH EVERY DAY  30 tablet  5  . predniSONE (DELTASONE) 50 MG tablet Take 50mg  13 hours, 7 hours, and 1 hour before CT scan.  3 tablet  0  . senna (SENOKOT) 8.6 MG TABS Take 1 tablet by mouth at bedtime and may repeat dose one time if needed. For constipation.      . Simethicone (GAS-X EXTRA STRENGTH PO) Take 1 tablet by mouth at bedtime as needed. As needed for gas.      . traMADol (ULTRAM) 50 MG tablet TAKE 1 TABLET BY MOUTH THREE TIMES A DAY AS NEEDED FOR PAIN  90 tablet  0  . triamcinolone ointment (KENALOG) 0.1 % RX was sent on 01-10-2013 with 5 refills  60 g  5  . triamterene-hydrochlorothiazide (MAXZIDE-25) 37.5-25 MG per tablet TAKE 1 TABLET BY MOUTH EVERY DAY TO CONTROL BLOOD PRESSURE  30 tablet  0  . [DISCONTINUED] calcium carbonate 200 MG capsule Take 250 mg by mouth daily. Does not take everyday       No current facility-administered medications for this visit.   Facility-Administered Medications Ordered in Other Visits   Medication Dose Route Frequency Provider Last Rate Last Dose  . hyaluronate sodium (RADIAPLEXRX) gel   Topical Once Thea Silversmith, MD      . topical emolient (BIAFINE) emulsion   Topical PRN Thea Silversmith, MD        SURGICAL HISTORY:  Past Surgical History  Procedure Laterality Date  . Lung lobectomy  1986    left  . Septoplasty    . Total hip arthroplasty  01/2010    left  . Bronchoscopy  02/2011  . Dilation and curettage of uterus  1980's  . Tubal ligation  1980's  . Cardiac catheterization    . Fetal blood transfusion  09/16/11  . Joint replacement  04/13/10    Left Hip  . Cataract extraction  03/12/2013    Left Eye     REVIEW OF SYSTEMS:  A comprehensive review of systems was negative except for: Respiratory: positive for dyspnea on exertion and pleurisy/chest pain   PHYSICAL EXAMINATION: General appearance: alert, cooperative and no distress Head: Normocephalic, without obvious abnormality, atraumatic Neck: no adenopathy, no JVD, supple, symmetrical, trachea midline and thyroid not enlarged, symmetric, no tenderness/mass/nodules Lymph nodes: Cervical, supraclavicular, and axillary nodes normal. Resp: clear to auscultation bilaterally Back: symmetric, no curvature. ROM normal. No CVA tenderness. Cardio: regular rate and rhythm, S1, S2 normal, no murmur, click, rub or gallop GI: soft, non-tender; bowel sounds normal; no masses,  no organomegaly Extremities: extremities normal, atraumatic, no cyanosis or edema  ECOG PERFORMANCE STATUS: 1 - Symptomatic but completely ambulatory  Blood pressure 154/49, pulse 87, temperature 98.1 F (36.7 C), temperature source Oral, resp. rate 19, height 5\' 6"  (1.676 m), weight  188 lb 11.2 oz (85.594 kg), SpO2 98.00%.  LABORATORY DATA: Lab Results  Component Value Date   WBC 10.8* 07/24/2013   HGB 11.3* 07/24/2013   HCT 34.2* 07/24/2013   MCV 91.4 07/24/2013   PLT 384 07/24/2013      Chemistry      Component Value Date/Time   NA 141  07/24/2013 1211   NA 141 06/27/2013 1421   NA 138 05/23/2012 1009   NA 142 10/25/2011 1200   K 4.2 07/24/2013 1211   K 3.9 06/27/2013 1421   K 4.6 10/25/2011 1200   CL 97 06/27/2013 1421   CL 99 07/21/2012 1125   CL 97* 10/25/2011 1200   CO2 23 07/24/2013 1211   CO2 26 06/27/2013 1421   CO2 24 10/25/2011 1200   BUN 19.9 07/24/2013 1211   BUN 15 06/27/2013 1421   BUN 22 05/23/2012 1009   BUN 15 10/25/2011 1200   CREATININE 1.3* 07/24/2013 1211   CREATININE 1.04* 06/27/2013 1421   CREATININE 0.8 10/25/2011 1200      Component Value Date/Time   CALCIUM 10.6* 07/24/2013 1211   CALCIUM 10.1 06/27/2013 1421   CALCIUM 9.6 10/25/2011 1200   ALKPHOS 104 07/24/2013 1211   ALKPHOS 115 06/27/2013 1421   ALKPHOS 106* 10/25/2011 1200   AST 17 07/24/2013 1211   AST 33 06/27/2013 1421   AST 25 10/25/2011 1200   ALT 16 07/24/2013 1211   ALT 24 06/27/2013 1421   ALT 14 10/25/2011 1200   BILITOT 0.30 07/24/2013 1211   BILITOT 0.2 06/27/2013 1421   BILITOT 0.60 10/25/2011 1200       RADIOGRAPHIC STUDIES: Ct Chest W Contrast  07/24/2013   CLINICAL DATA:  Followup lung cancer  EXAM: CT CHEST WITH CONTRAST  TECHNIQUE: Multidetector CT imaging of the chest was performed during intravenous contrast administration.  CONTRAST:  27mL OMNIPAQUE IOHEXOL 300 MG/ML  SOLN  COMPARISON:  01/22/2013  FINDINGS: There is no pleural effusion identified. Centrilobular emphysema. Consolidation, radiation fibrosis and volume loss are seen in the right perihilar region with involvement of primarily the right upper lobe. This is unchanged in appearance from the previous exam. Ground-glass attenuation in the right middle lobe measures 5.7 cm, image 25/series 7 and appears unchanged from the previous exam. Anterior right middle lobe nodule is unchanged at 6 mm, image 29/series 7. There are postoperative changes from left upper lobectomy. Nonspecific area of ground-glass attenuation in the left lower lobe appears stable from previous exam, image  26/series 7.  The heart size appears normal. There is no pericardial effusion. Calcified atherosclerotic disease involves the thoracic aorta. There are calcifications involving the LAD Coronary artery.  Incidental imaging through the upper abdomen shows mild diffuse low attenuation within the liver parenchyma. There is a nodule in the right adrenal gland which measures 1.5 cm, image 57/series 2. This is unchanged from previous exam. Left adrenal nodule is stable measuring 1.3 cm, image 57/series 2.  Review of the visualized bony structures shows stable compression deformities involving T4 and T5. The T4 vertebra has been treated with bone cement. Next  IMPRESSION: 1. Stable postoperative and post treatment changes in the lungs bilaterally. 2. No change in patchy area of ground-glass attenuation in the right hemi thorax. 3. Resolution of small right pleural effusion. 4. Hepatic steatosis. 5. Stable bilateral adrenal nodules. 6. No change in T4 and T5 compression fractures.   Electronically Signed   By: Kerby Moors M.D.   On: 07/24/2013 13:39  ASSESSMENT AND PLAN: this is a very pleasant 68 years old white female with history of stage IIB/IIIA non-small cell lung cancer status post concurrent chemoradiation followed by consolidation chemotherapy with carboplatin and Alimta and has been observation since April 2013. She has no evidence for disease progression on his recent scan. I discussed the scan results with the patient and recommended for her to continue on observation for now. I would see her back for follow up visit in 6 months with repeat CT scan of the chest. She was advised to call immediately if she has any concerning symptoms in the interval.  The patient voices understanding of current disease status and treatment options and is in agreement with the current care plan.  All questions were answered. The patient knows to call the clinic with any problems, questions or concerns. We can certainly  see the patient much sooner if necessary.  Disclaimer: This note was dictated with voice recognition software. Similar sounding words can inadvertently be transcribed and may not be corrected upon review.

## 2013-07-27 NOTE — Telephone Encounter (Signed)
May try zantac 150 mg in the mornings to help with GERD, will need follow up with [facility] if this is not effective for further evaluation

## 2013-07-27 NOTE — Telephone Encounter (Signed)
Discussed with patient, patient verbalized understanding of recommendations. Patient requested to schedule appointment, no available appointments with Dr.Pandey, patient scheduled to see Dr.Green on Wednesday 08/01/2013

## 2013-07-28 ENCOUNTER — Encounter: Payer: Self-pay | Admitting: Internal Medicine

## 2013-08-01 ENCOUNTER — Ambulatory Visit (INDEPENDENT_AMBULATORY_CARE_PROVIDER_SITE_OTHER): Payer: Medicare Other | Admitting: Internal Medicine

## 2013-08-01 ENCOUNTER — Other Ambulatory Visit: Payer: Self-pay | Admitting: Internal Medicine

## 2013-08-01 ENCOUNTER — Encounter: Payer: Self-pay | Admitting: Internal Medicine

## 2013-08-01 VITALS — BP 130/60 | HR 110 | Temp 97.9°F | Resp 20 | Ht 66.0 in | Wt 184.4 lb

## 2013-08-01 DIAGNOSIS — E1165 Type 2 diabetes mellitus with hyperglycemia: Secondary | ICD-10-CM

## 2013-08-01 DIAGNOSIS — E785 Hyperlipidemia, unspecified: Secondary | ICD-10-CM | POA: Diagnosis not present

## 2013-08-01 DIAGNOSIS — H9209 Otalgia, unspecified ear: Secondary | ICD-10-CM | POA: Diagnosis not present

## 2013-08-01 DIAGNOSIS — M62838 Other muscle spasm: Secondary | ICD-10-CM

## 2013-08-01 DIAGNOSIS — IMO0001 Reserved for inherently not codable concepts without codable children: Secondary | ICD-10-CM | POA: Diagnosis not present

## 2013-08-01 DIAGNOSIS — M549 Dorsalgia, unspecified: Secondary | ICD-10-CM | POA: Diagnosis not present

## 2013-08-01 DIAGNOSIS — I209 Angina pectoris, unspecified: Secondary | ICD-10-CM | POA: Diagnosis not present

## 2013-08-01 DIAGNOSIS — I2510 Atherosclerotic heart disease of native coronary artery without angina pectoris: Secondary | ICD-10-CM | POA: Diagnosis not present

## 2013-08-01 DIAGNOSIS — J329 Chronic sinusitis, unspecified: Secondary | ICD-10-CM

## 2013-08-01 DIAGNOSIS — K279 Peptic ulcer, site unspecified, unspecified as acute or chronic, without hemorrhage or perforation: Secondary | ICD-10-CM | POA: Diagnosis not present

## 2013-08-01 DIAGNOSIS — R1310 Dysphagia, unspecified: Secondary | ICD-10-CM

## 2013-08-01 DIAGNOSIS — M542 Cervicalgia: Secondary | ICD-10-CM | POA: Diagnosis not present

## 2013-08-01 DIAGNOSIS — R5383 Other fatigue: Secondary | ICD-10-CM | POA: Diagnosis not present

## 2013-08-01 DIAGNOSIS — E1142 Type 2 diabetes mellitus with diabetic polyneuropathy: Secondary | ICD-10-CM | POA: Insufficient documentation

## 2013-08-01 DIAGNOSIS — R5381 Other malaise: Secondary | ICD-10-CM | POA: Diagnosis not present

## 2013-08-01 DIAGNOSIS — R1013 Epigastric pain: Secondary | ICD-10-CM | POA: Diagnosis not present

## 2013-08-01 DIAGNOSIS — R1312 Dysphagia, oropharyngeal phase: Secondary | ICD-10-CM | POA: Insufficient documentation

## 2013-08-01 DIAGNOSIS — K219 Gastro-esophageal reflux disease without esophagitis: Secondary | ICD-10-CM

## 2013-08-01 MED ORDER — METOCLOPRAMIDE HCL 5 MG PO TABS: 5.0000 mg | ORAL_TABLET | Freq: Four times a day (QID) | ORAL | Status: DC

## 2013-08-01 MED ORDER — TIZANIDINE HCL 4 MG PO TABS: 4.0000 mg | ORAL_TABLET | Freq: Four times a day (QID) | ORAL | Status: DC | PRN

## 2013-08-01 MED ORDER — FREESTYLE LANCETS MISC
Status: DC
Start: 2013-08-01 — End: 2013-10-11

## 2013-08-01 MED ORDER — RANITIDINE HCL 300 MG PO TABS: 300.0000 mg | ORAL_TABLET | Freq: Every day | ORAL | Status: DC

## 2013-08-01 MED ORDER — GLUCOSE BLOOD VI STRP
ORAL_STRIP | Status: DC
Start: 2013-08-01 — End: 2013-10-11

## 2013-08-01 NOTE — Patient Instructions (Signed)
Stop citalopram and cyclobenzaprine (Flexeril) New medications have been added. You may need to see a gastroenterologist.

## 2013-08-01 NOTE — Progress Notes (Signed)
Patient ID: [name], female   DOB: 12-May-1945, 68 y.o.   MRN: [id number]    Location:  [facility]   Place of Service: OFFICE    Allergies  Allergen Reactions  . Iohexol Shortness Of Breath and Other (See Comments)    "burns me up inside"    Pt does fine with 13 hour prep  01/17/12  . Other     CAN'T STAND THE SMELL OF PURE BLEACH; "HAVE TO BACK UP AND POUR AND DON'T BREATH IT TIL i GET CAP BACK ON; DILUTE IT TO SPRAY"  . Codeine Itching and Nausea And Vomiting    REACTION: GI upset  . Protonix [Pantoprazole Sodium] Diarrhea    Chief Complaint  Patient presents with  . Acute Visit    acid reflux, rt side ear and throat is sore. muscle and bone spasms    HPI:  Having continued issues with acid reflux. Wants cold ice water all the time. History of ulcer 37 years ago. Got diarrhea from Protonix. History of EGD about 8 years ago by Dr Delfin Edis. Food sticks in her throat. Has not been able to eat popcorn or corn.  Has symptoms of bloating and full feelings. Has known gallstones noted on prior CT scans of the abdomen.  Complains of spasms in muscles and bone pains.  Sore in both ears, but worse on the right.  Medications: Patient's Medications  New Prescriptions   No medications on file  Previous Medications   ALBUTEROL-IPRATROPIUM (COMBIVENT) 18-103 MCG/ACT INHALER    every 4 (four) hours as needed. 2 puffs every for hours for lung cancer   ALPRAZOLAM (XANAX) 1 MG TABLET    TAKE 1 TABLET BY MOUTH THREE TIMES A DAY AS NEEDED FOR ANIETY   AMBULATORY NON FORMULARY MEDICATION    Lighter Weight Rollator with Padded Seat and Hand Brakes Dx: 496, 729.1, 722.52   ASPIRIN EC 325 MG TABLET    Take 325 mg by mouth daily.   CALCIUM CITRATE-VITAMIN D (CITRACAL + D PO)    Take 2 tablets by mouth daily.   CITALOPRAM (CELEXA) 20 MG TABLET    TAKE ONE TABLET BY MOUTH EVERY DAY   COMBIVENT RESPIMAT 20-100 MCG/ACT AERS RESPIMAT    INHALE 1 PUFF BY MOUTH EVERY 6 HOURS AS NEEDED FOR SHORTNESS  OF BREATH   CYCLOBENZAPRINE (FLEXERIL) 5 MG TABLET    Take 5 mg by mouth 3 (three) times daily as needed for muscle spasms. Take one tablet at bedtime if needed   DILTIAZEM (CARDIZEM CD) 240 MG 24 HR CAPSULE    TAKE 1 CAPSULE BY MOUTH EVERY DAY FOR BLOOD PRESSURE   DILTIAZEM (DILACOR XR) 240 MG 24 HR CAPSULE    Take one tablet once a day for blood pressure   GEMFIBROZIL (LOPID) 600 MG TABLET    TAKE 1 TABLET BY MOUTH TWICE DAILY TO HELP LOWER TRIGLYCERIDES AND CHOLESTEROL   HEMOCYTE-F 324-1 MG TABS    TAKE 1 TABLET BY MOUTH DAILY   HYDROCODONE-ACETAMINOPHEN (NORCO) 7.5-325 MG PER TABLET    Take 1 tablet by mouth every 6 hours as needed for severe pain   LIDOCAINE (LIDODERM) 5 %    Place 1 patch onto the skin daily as needed. For shoulder pain.Remove & Discard patch within 12 hours or as directed by MD.   LORATADINE (CLARITIN) 10 MG TABLET    Take 1 tablet (10 mg total) by mouth daily.   LOSARTAN (COZAAR) 50 MG TABLET    Take  one tablet by mouth twice daily   MULTIPLE VITAMINS-MINERALS (CENTRUM SILVER PO)    Take 1 tablet by mouth daily.   POTASSIUM CHLORIDE (K-DUR,KLOR-CON) 10 MEQ TABLET    TAKE 1 TABLET BY MOUTH EVERY DAY   SENNA (SENOKOT) 8.6 MG TABS    Take 1 tablet by mouth at bedtime and may repeat dose one time if needed. For constipation.   SIMETHICONE (GAS-X EXTRA STRENGTH PO)    Take 1 tablet by mouth at bedtime as needed. As needed for gas.   TRAMADOL (ULTRAM) 50 MG TABLET    TAKE 1 TABLET BY MOUTH THREE TIMES A DAY AS NEEDED FOR PAIN   TRIAMCINOLONE OINTMENT (KENALOG) 0.1 %    RX was sent on 01-10-2013 with 5 refills   TRIAMTERENE-HYDROCHLOROTHIAZIDE (MAXZIDE-25) 37.5-25 MG PER TABLET    TAKE 1 TABLET BY MOUTH EVERY DAY TO CONTROL BLOOD PRESSURE  Modified Medications   No medications on file  Discontinued Medications   DIPHENHYDRAMINE (BENADRYL) 50 MG TABLET    Take 1 tablet (50 mg total) by mouth as directed. 1 hour before CT scan   PANTOPRAZOLE (PROTONIX) 20 MG TABLET    Take 1  tablet (20 mg total) by mouth daily.   PREDNISONE (DELTASONE) 50 MG TABLET    Take 50mg  13 hours, 7 hours, and 1 hour before CT scan.     Review of Systems  Constitutional: Positive for activity change.       Lost 4# since last visit  HENT: Positive for ear pain (right side) and trouble swallowing. Negative for congestion.   Respiratory: Positive for choking and shortness of breath. Negative for chest tightness, wheezing and stridor.   Cardiovascular: Negative for chest pain and palpitations.  Gastrointestinal: Positive for constipation. Negative for nausea, abdominal pain, diarrhea and abdominal distention.       Reflux frequently  Musculoskeletal: Positive for arthralgias, back pain, myalgias and neck pain.  Skin: Negative.   Psychiatric/Behavioral: Positive for sleep disturbance and dysphoric mood. Negative for hallucinations. The patient is not nervous/anxious and is not hyperactive.     Filed Vitals:   08/01/13 1152  BP: 130/60  Pulse: 110  Temp: 97.9 F (36.6 C)  TempSrc: Oral  Resp: 20  Height: 5\' 6"  (1.676 m)  Weight: 184 lb 6.4 oz (83.643 kg)  SpO2: 96%   Physical Exam  Constitutional: She is oriented to person, place, and time.  overweight  HENT:  Right Ear: External ear normal.  Left Ear: External ear normal.  Nose: Nose normal.  Mouth/Throat: Oropharynx is clear and moist. No oropharyngeal exudate.  Eyes: Conjunctivae and EOM are normal. Pupils are equal, round, and reactive to light.  Neck: No JVD present. No tracheal deviation present. No thyromegaly present.  Cardiovascular: Normal rate, regular rhythm and normal heart sounds.  Exam reveals no gallop and no friction rub.   No murmur heard. Pulmonary/Chest: No respiratory distress. She has no wheezes. She has no rales. She exhibits no tenderness.  Abdominal: She exhibits no distension and no mass. There is no tenderness.  Musculoskeletal: Normal range of motion. She exhibits edema and tenderness.    Lymphadenopathy:    She has no cervical adenopathy.  Neurological: She is oriented to person, place, and time. No cranial nerve deficit. Coordination normal.  Skin: No rash noted. No erythema. No pallor.  Psychiatric: Her behavior is normal. Judgment and thought content normal.     Labs reviewed: Appointment on 07/24/2013  Component Date Value Ref Range Status  .  Sodium 07/24/2013 141  136 - 145 mEq/L Final  . Potassium 07/24/2013 4.2  3.5 - 5.1 mEq/L Final  . Chloride 07/24/2013 102  98 - 109 mEq/L Final  . CO2 07/24/2013 23  22 - 29 mEq/L Final  . Glucose 07/24/2013 187* 70 - 140 mg/dl Final  . BUN 07/24/2013 19.9  7.0 - 26.0 mg/dL Final  . Creatinine 07/24/2013 1.3* 0.6 - 1.1 mg/dL Final  . Total Bilirubin 07/24/2013 0.30  0.20 - 1.20 mg/dL Final  . Alkaline Phosphatase 07/24/2013 104  40 - 150 U/L Final  . AST 07/24/2013 17  5 - 34 U/L Final  . ALT 07/24/2013 16  0 - 55 U/L Final  . Total Protein 07/24/2013 7.8  6.4 - 8.3 g/dL Final  . Albumin 07/24/2013 4.3  3.5 - 5.0 g/dL Final  . Calcium 07/24/2013 10.6* 8.4 - 10.4 mg/dL Final  . Anion Gap 07/24/2013 16* 3 - 11 mEq/L Final  . WBC 07/24/2013 10.8* 3.9 - 10.3 10e3/uL Final  . NEUT# 07/24/2013 9.5* 1.5 - 6.5 10e3/uL Final  . HGB 07/24/2013 11.3* 11.6 - 15.9 g/dL Final  . HCT 07/24/2013 34.2* 34.8 - 46.6 % Final  . Platelets 07/24/2013 384  145 - 400 10e3/uL Final  . MCV 07/24/2013 91.4  79.5 - 101.0 fL Final  . MCH 07/24/2013 30.1  25.1 - 34.0 pg Final  . MCHC 07/24/2013 33.0  31.5 - 36.0 g/dL Final  . RBC 07/24/2013 3.74  3.70 - 5.45 10e6/uL Final  . RDW 07/24/2013 13.3  11.2 - 14.5 % Final  . lymph# 07/24/2013 1.1  0.9 - 3.3 10e3/uL Final  . MONO# 07/24/2013 0.1  0.1 - 0.9 10e3/uL Final  . Eosinophils Absolute 07/24/2013 0.0  0.0 - 0.5 10e3/uL Final  . Basophils Absolute 07/24/2013 0.1  0.0 - 0.1 10e3/uL Final  . NEUT% 07/24/2013 88.1* 38.4 - 76.8 % Final  . LYMPH% 07/24/2013 10.4* 14.0 - 49.7 % Final  . MONO%  07/24/2013 0.9  0.0 - 14.0 % Final  . EOS% 07/24/2013 0.0  0.0 - 7.0 % Final  . BASO% 07/24/2013 0.6  0.0 - 2.0 % Final  Office Visit on 06/27/2013  Component Date Value Ref Range Status  . WBC 06/27/2013 8.0  3.4 - 10.8 x10E3/uL Final  . RBC 06/27/2013 3.72* 3.77 - 5.28 x10E6/uL Final  . Hemoglobin 06/27/2013 11.1  11.1 - 15.9 g/dL Final  . HCT 06/27/2013 32.7* 34.0 - 46.6 % Final  . MCV 06/27/2013 88  79 - 97 fL Final  . MCH 06/27/2013 29.8  26.6 - 33.0 pg Final  . MCHC 06/27/2013 33.9  31.5 - 35.7 g/dL Final  . RDW 06/27/2013 13.5  12.3 - 15.4 % Final  . Neutrophils Relative % 06/27/2013 64   Final  . Lymphs 06/27/2013 25   Final  . Monocytes 06/27/2013 9   Final  . Eos 06/27/2013 2   Final  . Basos 06/27/2013 0   Final  . Neutrophils Absolute 06/27/2013 5.1  1.4 - 7.0 x10E3/uL Final  . Lymphocytes Absolute 06/27/2013 2.0  0.7 - 3.1 x10E3/uL Final  . Monocytes Absolute 06/27/2013 0.7  0.1 - 0.9 x10E3/uL Final  . Eosinophils Absolute 06/27/2013 0.2  0.0 - 0.4 x10E3/uL Final  . Basophils Absolute 06/27/2013 0.0  0.0 - 0.2 x10E3/uL Final  . Immature Granulocytes 06/27/2013 0   Final  . Immature Grans (Abs) 06/27/2013 0.0  0.0 - 0.1 x10E3/uL Final  . Glucose 06/27/2013 104* 65 - 99 mg/dL  Final  . BUN 06/27/2013 15  8 - 27 mg/dL Final  . Creatinine, Ser 06/27/2013 1.04* 0.57 - 1.00 mg/dL Final  . GFR calc non Af Amer 06/27/2013 55* >59 mL/min/1.73 Final  . GFR calc Af Amer 06/27/2013 63  >59 mL/min/1.73 Final  . BUN/Creatinine Ratio 06/27/2013 14  11 - 26 Final  . Sodium 06/27/2013 141  134 - 144 mmol/L Final  . Potassium 06/27/2013 3.9  3.5 - 5.2 mmol/L Final  . Chloride 06/27/2013 97  97 - 108 mmol/L Final  . CO2 06/27/2013 26  18 - 29 mmol/L Final  . Calcium 06/27/2013 10.1  8.7 - 10.3 mg/dL Final  . Total Protein 06/27/2013 6.5  6.0 - 8.5 g/dL Final  . Albumin 06/27/2013 4.5  3.6 - 4.8 g/dL Final  . Globulin, Total 06/27/2013 2.0  1.5 - 4.5 g/dL Final  . Albumin/Globulin  Ratio 06/27/2013 2.3  1.1 - 2.5 Final  . Total Bilirubin 06/27/2013 0.2  0.0 - 1.2 mg/dL Final  . Alkaline Phosphatase 06/27/2013 115  39 - 117 IU/L Final  . AST 06/27/2013 33  0 - 40 IU/L Final  . ALT 06/27/2013 24  0 - 32 IU/L Final  . BNP 06/27/2013 18.2  0.0 - 100.0 pg/mL Final      Assessment/Plan  Esophageal reflux - Plan: metoCLOPramide (REGLAN) 5 MG tablet  SINUSITIS, CHRONIC NOS: contributes to sensation of pain in the right ear  Otalgia: discomfort wiithout anatomic changes  Type II or unspecified type diabetes mellitus without mention of complication: controlled  Dysphagia, unspecified(787.20): suspect esophageal disorder  PUD (peptic ulcer disease)   - Plan: H. pylori Antibody, IgG, NM Gastric Emptying, DG UGI W/Kub, ranitidine (ZANTAC) 300 MG tablet  Muscle spasm:   - Plan: refilled tiZANidine (ZANAFLEX) 4 MG tablet  Myalgia and myositis  observe

## 2013-08-02 ENCOUNTER — Telehealth: Payer: Self-pay | Admitting: *Deleted

## 2013-08-02 ENCOUNTER — Other Ambulatory Visit: Payer: Self-pay | Admitting: *Deleted

## 2013-08-02 ENCOUNTER — Telehealth: Payer: Self-pay | Admitting: Emergency Medicine

## 2013-08-02 LAB — H. PYLORI ANTIBODY, IGG: H Pylori IgG: <0.9 U/mL (ref 0.0–0.8)

## 2013-08-02 NOTE — Telephone Encounter (Signed)
Patient states that you told her yesterday that you were going to call in Metformin for her to go back on, but there was no Rx at her pharmacy. I do not see this medication in your note or the medication list. Is this something you want to restart? Please Advise.

## 2013-08-02 NOTE — Telephone Encounter (Signed)
Resume metformin 500 mg bid.

## 2013-08-02 NOTE — Telephone Encounter (Signed)
Spoke with the pt  She states that she is needing a new battery for her wheelchair I advised that we did not order this chair and should she should contact who did order this  Per pt, Dr Sabra Heck ordered this, but he is now a hospice doc  She is asking that we order this for her since she is is a wheelchair due to her SOB  Please advise if okay with you, thanks!

## 2013-08-02 NOTE — Telephone Encounter (Signed)
Pt was wondering if she was supposed to get a prescription for Metformin when she was in last OV? We did send in a glucose meter/strips for her. Please advise. Thanks

## 2013-08-02 NOTE — Telephone Encounter (Signed)
Yes. See the previous note from [facility] sent earlier today.

## 2013-08-03 MED ORDER — METFORMIN HCL 500 MG PO TABS: ORAL_TABLET | ORAL | Status: DC

## 2013-08-03 NOTE — Telephone Encounter (Signed)
rx sent to the pharmacy.  Pt notified [facility] phone.

## 2013-08-03 NOTE — Telephone Encounter (Signed)
Pt was notified and RX sent to pharmacy

## 2013-08-03 NOTE — Telephone Encounter (Signed)
I called spoke with pt. I made her aware we are awaiting RB decision. Please advise RB thanks

## 2013-08-03 NOTE — Telephone Encounter (Signed)
Pt is calling back about the battery for her wheelchair.  She states her breathing has gotten bad.  Her ph # [phone].

## 2013-08-06 NOTE — Telephone Encounter (Signed)
Order the battery x1. In the future it would be best for her primary MD to take care of this.

## 2013-08-06 NOTE — Telephone Encounter (Signed)
LMTCB for [employer] TCB

## 2013-08-07 NOTE — Telephone Encounter (Signed)
Pt is aware that [employer] is scheduled to come to her home tomorrow for wheelchair eval and then will send rx to physician to sign and then get approval from medicare. Nothing more needed at this time.

## 2013-08-07 NOTE — Telephone Encounter (Signed)
[facility] for pt - Let her know that I spoke with Gerald Stabs at [employer] [phone]. [phone]) and she states that a technician is scheduled to come to pt's home tomorrow afternoon to evaluate the wheelchair.  Then they will have to send a rx to her physician to get signed and then sned this to Medicare for approval.  They have Dr Lorrin Mais on file as her PCP and the Dr that needs to sign her orders.  Pt needs to let Gerald Stabs know if this is the physician that needs to sign orders instead of Dr Lamonte Sakai.

## 2013-08-09 ENCOUNTER — Ambulatory Visit (HOSPITAL_COMMUNITY)
Admission: RE | Admit: 2013-08-09 | Discharge: 2013-08-09 | Disposition: A | Discharge status: Home / Self Care | Payer: Medicare Other | Source: Ambulatory Visit | Attending: Internal Medicine | Admitting: Internal Medicine

## 2013-08-09 DIAGNOSIS — K277 Chronic peptic ulcer, site unspecified, without hemorrhage or perforation: Secondary | ICD-10-CM | POA: Insufficient documentation

## 2013-08-09 DIAGNOSIS — K279 Peptic ulcer, site unspecified, unspecified as acute or chronic, without hemorrhage or perforation: Secondary | ICD-10-CM

## 2013-08-09 DIAGNOSIS — K219 Gastro-esophageal reflux disease without esophagitis: Secondary | ICD-10-CM | POA: Diagnosis not present

## 2013-08-10 ENCOUNTER — Telehealth: Payer: Self-pay | Admitting: Cardiovascular Disease

## 2013-08-10 NOTE — Telephone Encounter (Signed)
Called patient and advised her that I do not see any attempt from our office to reach her.  Patient verbalized understanding and gratitude.

## 2013-08-10 NOTE — Telephone Encounter (Signed)
New message    Patient calling stating someone called her today.

## 2013-08-15 ENCOUNTER — Encounter: Payer: Self-pay | Admitting: Internal Medicine

## 2013-08-15 ENCOUNTER — Ambulatory Visit (INDEPENDENT_AMBULATORY_CARE_PROVIDER_SITE_OTHER): Payer: Medicare Other | Admitting: Internal Medicine

## 2013-08-15 VITALS — BP 124/68 | HR 81 | Temp 97.8°F | Resp 18 | Ht 66.0 in | Wt 187.4 lb

## 2013-08-15 DIAGNOSIS — R269 Unspecified abnormalities of gait and mobility: Secondary | ICD-10-CM

## 2013-08-15 DIAGNOSIS — R5381 Other malaise: Secondary | ICD-10-CM

## 2013-08-15 DIAGNOSIS — J449 Chronic obstructive pulmonary disease, unspecified: Secondary | ICD-10-CM

## 2013-08-15 DIAGNOSIS — E1165 Type 2 diabetes mellitus with hyperglycemia: Secondary | ICD-10-CM

## 2013-08-15 DIAGNOSIS — K219 Gastro-esophageal reflux disease without esophagitis: Secondary | ICD-10-CM | POA: Diagnosis not present

## 2013-08-15 DIAGNOSIS — R531 Weakness: Secondary | ICD-10-CM

## 2013-08-15 DIAGNOSIS — IMO0001 Reserved for inherently not codable concepts without codable children: Secondary | ICD-10-CM | POA: Diagnosis not present

## 2013-08-15 DIAGNOSIS — D649 Anemia, unspecified: Secondary | ICD-10-CM

## 2013-08-15 DIAGNOSIS — R0602 Shortness of breath: Secondary | ICD-10-CM

## 2013-08-15 DIAGNOSIS — R5383 Other fatigue: Secondary | ICD-10-CM

## 2013-08-15 DIAGNOSIS — M549 Dorsalgia, unspecified: Secondary | ICD-10-CM

## 2013-08-15 DIAGNOSIS — E785 Hyperlipidemia, unspecified: Secondary | ICD-10-CM

## 2013-08-15 DIAGNOSIS — M797 Fibromyalgia: Secondary | ICD-10-CM

## 2013-08-15 MED ORDER — CITALOPRAM HYDROBROMIDE 20 MG PO TABS: ORAL_TABLET | ORAL | Status: DC

## 2013-08-15 MED ORDER — TRAMADOL HCL 50 MG PO TABS: ORAL_TABLET | ORAL | Status: DC

## 2013-08-15 MED ORDER — HYDROCODONE-ACETAMINOPHEN 7.5-325 MG PO TABS: ORAL_TABLET | ORAL | Status: DC

## 2013-08-15 NOTE — Progress Notes (Addendum)
Patient ID: [name], female   DOB: 1946-01-22, 68 y.o.   MRN: [id number]    Location:  [facility]   Place of Service: OFFICE    Allergies  Allergen Reactions  . Iohexol Shortness Of Breath and Other (See Comments)    "burns me up inside"    Pt does fine with 13 hour prep  01/17/12  . Other     CAN'T STAND THE SMELL OF PURE BLEACH; "HAVE TO BACK UP AND POUR AND DON'T BREATH IT TIL i GET CAP BACK ON; DILUTE IT TO SPRAY"  . Codeine Itching and Nausea And Vomiting    REACTION: GI upset  . Protonix [Pantoprazole Sodium] Diarrhea    Chief Complaint  Patient presents with  . Follow-up    HPI:  GERD (gastroesophageal reflux disease): symptoms improved on metoclopramide  COPD (chronic obstructive pulmonary disease): remains O2 dependent and dyspneic most of the time  Fibromyalgia - Increased muscular aches ad emotional lability since Celexa was stopped.  Type II or unspecified type diabetes mellitus without mention of complication, uncontrolled: glucose still running high. Poor dietary compliance.  Anemia: improved. hgb up to 11.3  Back pain - benefits from  Current pain m,edications. She thinks she be getting tolerant to them. She asks about using NSAID.  Gait abnormality: unsteady on feet. Using cane.  HYPERLIPIDEMIA: pharmacogenetic testing shows potential for multiple drug interactions with gemfibrozil. Discussed with patient. We have elected to stop this medication and repeat lipids next visit.  Weakness generalized: multifactorial. She is unable to walk any significant distance due to hypoxia, myalgias, heart disease and back pains. She is requesting replacement battery for a motorized wheelchair.   SOB (shortness of breath): due to combination of problems with her chronic lung disease and heart problems. Unchanged.    Medications: Patient's Medications  New Prescriptions   No medications on file  Previous Medications   ALBUTEROL-IPRATROPIUM (COMBIVENT) 18-103  MCG/ACT INHALER    every 4 (four) hours as needed. 2 puffs every for hours for lung cancer   ALPRAZOLAM (XANAX) 1 MG TABLET    TAKE 1 TABLET BY MOUTH THREE TIMES A DAY AS NEEDED FOR ANIETY   AMBULATORY NON FORMULARY MEDICATION    Lighter Weight Rollator with Padded Seat and Hand Brakes Dx: 496, 729.1, 722.52   ASPIRIN EC 325 MG TABLET    Take 325 mg by mouth daily.   CALCIUM CITRATE-VITAMIN D (CITRACAL + D PO)    Take 2 tablets by mouth daily.   COMBIVENT RESPIMAT 20-100 MCG/ACT AERS RESPIMAT    INHALE 1 PUFF BY MOUTH EVERY 6 HOURS AS NEEDED FOR SHORTNESS OF BREATH   DILTIAZEM (CARDIZEM CD) 240 MG 24 HR CAPSULE    TAKE 1 CAPSULE BY MOUTH EVERY DAY FOR BLOOD PRESSURE   GEMFIBROZIL (LOPID) 600 MG TABLET    TAKE 1 TABLET BY MOUTH TWICE DAILY TO HELP LOWER TRIGLYCERIDES AND CHOLESTEROL   GLUCOSE BLOOD (FREESTYLE LITE) TEST STRIP    Use as instructed for Diabetes 250.00   HEMOCYTE-F 324-1 MG TABS    TAKE 1 TABLET BY MOUTH DAILY   HYDROCODONE-ACETAMINOPHEN (NORCO) 7.5-325 MG PER TABLET    Take 1 tablet by mouth every 6 hours as needed for severe pain   LANCETS (FREESTYLE) LANCETS    Use as instructed for Diabetes   LIDOCAINE (LIDODERM) 5 %    Place 1 patch onto the skin daily as needed. For shoulder pain.Remove & Discard patch within 12 hours or as directed by MD.  LORATADINE (CLARITIN) 10 MG TABLET    Take 1 tablet (10 mg total) by mouth daily.   LOSARTAN (COZAAR) 50 MG TABLET    Take one tablet by mouth twice daily   METFORMIN (GLUCOPHAGE) 500 MG TABLET    Take 1 tablet twice daily for Diabetes   METOCLOPRAMIDE (REGLAN) 5 MG TABLET    Take 1 tablet (5 mg total) by mouth 4 (four) times daily.   MULTIPLE VITAMINS-MINERALS (CENTRUM SILVER PO)    Take 1 tablet by mouth daily.   POTASSIUM CHLORIDE (K-DUR,KLOR-CON) 10 MEQ TABLET    TAKE 1 TABLET BY MOUTH EVERY DAY   RANITIDINE (ZANTAC) 300 MG TABLET    Take 1 tablet (300 mg total) by mouth at bedtime.   SENNA (SENOKOT) 8.6 MG TABS    Take 1 tablet by  mouth at bedtime and may repeat dose one time if needed. For constipation.   SIMETHICONE (GAS-X EXTRA STRENGTH PO)    Take 1 tablet by mouth at bedtime as needed. As needed for gas.   TIZANIDINE (ZANAFLEX) 4 MG TABLET    TAKE 1 TABLET BY MOUTH EVERY 6 HOURS AS NEEDED FOR MUSCLE SPASMS   TRAMADOL (ULTRAM) 50 MG TABLET    TAKE 1 TABLET BY MOUTH THREE TIMES A DAY AS NEEDED FOR PAIN   TRIAMCINOLONE OINTMENT (KENALOG) 0.1 %    RX was sent on 01-10-2013 with 5 refills   TRIAMTERENE-HYDROCHLOROTHIAZIDE (MAXZIDE-25) 37.5-25 MG PER TABLET    TAKE 1 TABLET BY MOUTH EVERY DAY TO CONTROL BLOOD PRESSURE  Modified Medications   No medications on file  Discontinued Medications   No medications on file     Review of Systems  Constitutional: Positive for activity change.       Lost 4# since last visit  HENT: Positive for ear pain (right side) and trouble swallowing. Negative for congestion.   Respiratory: Positive for choking and shortness of breath. Negative for chest tightness, wheezing and stridor.   Cardiovascular: Negative for chest pain and palpitations.  Gastrointestinal: Positive for constipation. Negative for nausea, abdominal pain, diarrhea and abdominal distention.       Reflux frequently  Musculoskeletal: Positive for arthralgias, back pain, gait problem, myalgias and neck pain.  Skin: Negative.   Neurological: Positive for dizziness, weakness (generalized) and light-headedness. Negative for tremors and syncope.  Psychiatric/Behavioral: Positive for sleep disturbance and dysphoric mood. Negative for hallucinations. The patient is not nervous/anxious and is not hyperactive.     Filed Vitals:   08/15/13 1553  BP: 124/68  Pulse: 81  Temp: 97.8 F (36.6 C)  TempSrc: Oral  Resp: 18  Height: 5\' 6"  (1.676 m)  Weight: 187 lb 6.4 oz (85.004 kg)  SpO2: 93%   Physical Exam  Constitutional: She is oriented to person, place, and time.  overweight  HENT:  Right Ear: External ear normal.  Left  Ear: External ear normal.  Nose: Nose normal.  Mouth/Throat: Oropharynx is clear and moist. No oropharyngeal exudate.  Eyes: Conjunctivae and EOM are normal. Pupils are equal, round, and reactive to light.  Neck: No JVD present. No tracheal deviation present. No thyromegaly present.  Cardiovascular: Normal rate, regular rhythm and normal heart sounds.  Exam reveals no gallop and no friction rub.   No murmur heard. Pulmonary/Chest: No respiratory distress. She has no wheezes. She has no rales. She exhibits no tenderness.  Abdominal: She exhibits no distension and no mass. There is no tenderness.  Musculoskeletal: Normal range of motion. She exhibits edema and  tenderness.  Lymphadenopathy:    She has no cervical adenopathy.  Neurological: She is oriented to person, place, and time. No cranial nerve deficit. Coordination normal.  Skin: No rash noted. No erythema. No pallor.  Psychiatric: Her behavior is normal. Judgment and thought content normal.     Labs reviewed: Office Visit on 08/01/2013  Component Date Value Ref Range Status  . H Pylori IgG 08/01/2013 <0.9  0.0 - 0.8 U/mL Final   Comment:                              Negative            <0.9                                                       Indeterminate  0.9 - 1.0                                                       Positive            >1.0  Appointment on 07/24/2013  Component Date Value Ref Range Status  . Sodium 07/24/2013 141  136 - 145 mEq/L Final  . Potassium 07/24/2013 4.2  3.5 - 5.1 mEq/L Final  . Chloride 07/24/2013 102  98 - 109 mEq/L Final  . CO2 07/24/2013 23  22 - 29 mEq/L Final  . Glucose 07/24/2013 187* 70 - 140 mg/dl Final  . BUN 07/24/2013 19.9  7.0 - 26.0 mg/dL Final  . Creatinine 07/24/2013 1.3* 0.6 - 1.1 mg/dL Final  . Total Bilirubin 07/24/2013 0.30  0.20 - 1.20 mg/dL Final  . Alkaline Phosphatase 07/24/2013 104  40 - 150 U/L Final  . AST 07/24/2013 17  5 - 34 U/L Final  . ALT 07/24/2013 16  0 - 55  U/L Final  . Total Protein 07/24/2013 7.8  6.4 - 8.3 g/dL Final  . Albumin 07/24/2013 4.3  3.5 - 5.0 g/dL Final  . Calcium 07/24/2013 10.6* 8.4 - 10.4 mg/dL Final  . Anion Gap 07/24/2013 16* 3 - 11 mEq/L Final  . WBC 07/24/2013 10.8* 3.9 - 10.3 10e3/uL Final  . NEUT# 07/24/2013 9.5* 1.5 - 6.5 10e3/uL Final  . HGB 07/24/2013 11.3* 11.6 - 15.9 g/dL Final  . HCT 07/24/2013 34.2* 34.8 - 46.6 % Final  . Platelets 07/24/2013 384  145 - 400 10e3/uL Final  . MCV 07/24/2013 91.4  79.5 - 101.0 fL Final  . MCH 07/24/2013 30.1  25.1 - 34.0 pg Final  . MCHC 07/24/2013 33.0  31.5 - 36.0 g/dL Final  . RBC 07/24/2013 3.74  3.70 - 5.45 10e6/uL Final  . RDW 07/24/2013 13.3  11.2 - 14.5 % Final  . lymph# 07/24/2013 1.1  0.9 - 3.3 10e3/uL Final  . MONO# 07/24/2013 0.1  0.1 - 0.9 10e3/uL Final  . Eosinophils Absolute 07/24/2013 0.0  0.0 - 0.5 10e3/uL Final  . Basophils Absolute 07/24/2013 0.1  0.0 - 0.1 10e3/uL Final  . NEUT% 07/24/2013 88.1* 38.4 - 76.8 % Final  . LYMPH% 07/24/2013 10.4* 14.0 - 49.7 % Final  . MONO%  07/24/2013 0.9  0.0 - 14.0 % Final  . EOS% 07/24/2013 0.0  0.0 - 7.0 % Final  . BASO% 07/24/2013 0.6  0.0 - 2.0 % Final  Office Visit on 06/27/2013  Component Date Value Ref Range Status  . WBC 06/27/2013 8.0  3.4 - 10.8 x10E3/uL Final  . RBC 06/27/2013 3.72* 3.77 - 5.28 x10E6/uL Final  . Hemoglobin 06/27/2013 11.1  11.1 - 15.9 g/dL Final  . HCT 06/27/2013 32.7* 34.0 - 46.6 % Final  . MCV 06/27/2013 88  79 - 97 fL Final  . MCH 06/27/2013 29.8  26.6 - 33.0 pg Final  . MCHC 06/27/2013 33.9  31.5 - 35.7 g/dL Final  . RDW 06/27/2013 13.5  12.3 - 15.4 % Final  . Neutrophils Relative % 06/27/2013 64   Final  . Lymphs 06/27/2013 25   Final  . Monocytes 06/27/2013 9   Final  . Eos 06/27/2013 2   Final  . Basos 06/27/2013 0   Final  . Neutrophils Absolute 06/27/2013 5.1  1.4 - 7.0 x10E3/uL Final  . Lymphocytes Absolute 06/27/2013 2.0  0.7 - 3.1 x10E3/uL Final  . Monocytes Absolute 06/27/2013  0.7  0.1 - 0.9 x10E3/uL Final  . Eosinophils Absolute 06/27/2013 0.2  0.0 - 0.4 x10E3/uL Final  . Basophils Absolute 06/27/2013 0.0  0.0 - 0.2 x10E3/uL Final  . Immature Granulocytes 06/27/2013 0   Final  . Immature Grans (Abs) 06/27/2013 0.0  0.0 - 0.1 x10E3/uL Final  . Glucose 06/27/2013 104* 65 - 99 mg/dL Final  . BUN 06/27/2013 15  8 - 27 mg/dL Final  . Creatinine, Ser 06/27/2013 1.04* 0.57 - 1.00 mg/dL Final  . GFR calc non Af Amer 06/27/2013 55* >59 mL/min/1.73 Final  . GFR calc Af Amer 06/27/2013 63  >59 mL/min/1.73 Final  . BUN/Creatinine Ratio 06/27/2013 14  11 - 26 Final  . Sodium 06/27/2013 141  134 - 144 mmol/L Final  . Potassium 06/27/2013 3.9  3.5 - 5.2 mmol/L Final  . Chloride 06/27/2013 97  97 - 108 mmol/L Final  . CO2 06/27/2013 26  18 - 29 mmol/L Final  . Calcium 06/27/2013 10.1  8.7 - 10.3 mg/dL Final  . Total Protein 06/27/2013 6.5  6.0 - 8.5 g/dL Final  . Albumin 06/27/2013 4.5  3.6 - 4.8 g/dL Final  . Globulin, Total 06/27/2013 2.0  1.5 - 4.5 g/dL Final  . Albumin/Globulin Ratio 06/27/2013 2.3  1.1 - 2.5 Final  . Total Bilirubin 06/27/2013 0.2  0.0 - 1.2 mg/dL Final  . Alkaline Phosphatase 06/27/2013 115  39 - 117 IU/L Final  . AST 06/27/2013 33  0 - 40 IU/L Final  . ALT 06/27/2013 24  0 - 32 IU/L Final  . BNP 06/27/2013 18.2  0.0 - 100.0 pg/mL Final      Assessment/Plan  1. GERD (gastroesophageal reflux disease) Symptoms are better on metoclopramiode  2. COPD (chronic obstructive pulmonary disease) unchnanged  3. Fibromyalgia - resume citalopram (CELEXA) 20 MG tablet; One daily to help mood.  Dispense: 90 tablet; Refill: 3  4. Type II or unspecified type diabetes mellitus without mention of complication, uncontrolled Advised better diet control  5. Anemia improved  6. Back pain - HYDROcodone-acetaminophen (NORCO) 7.5-325 MG per tablet; Take 1 tablet by mouth every 6 hours as needed for severe pain  Dispense: 120 tablet; Refill: 0 - traMADol  (ULTRAM) 50 MG tablet; TAKE 1 TABLET BY MOUTH THREE TIMES A DAY AS NEEDED FOR PAIN  Dispense: 90 tablet;  Refill: 0  7. Gait abnormality observe  8. HYPERLIPIDEMIA Stop gemfibrozil. Lipids next visit  9. Weakness generalized Obtain replacement battery for a motorized wheelchair  10. SOB (shortness of breath) unchanged

## 2013-08-15 NOTE — Patient Instructions (Addendum)
Stop gemfibrozil. Stop iron.

## 2013-08-16 ENCOUNTER — Telehealth: Payer: Self-pay | Admitting: Emergency Medicine

## 2013-08-16 ENCOUNTER — Other Ambulatory Visit: Payer: Self-pay | Admitting: Internal Medicine

## 2013-08-16 DIAGNOSIS — J9691 Respiratory failure, unspecified with hypoxia: Secondary | ICD-10-CM

## 2013-08-16 DIAGNOSIS — R269 Unspecified abnormalities of gait and mobility: Secondary | ICD-10-CM | POA: Insufficient documentation

## 2013-08-16 NOTE — Telephone Encounter (Signed)
I called spoke with [name]. She reports since the heat/humidity she has had to turn O2 up to 3 l/m. Even at times she still has some SOB. She wants [place] to send order to get her a bigger portable O2 tank. Her current tank does not last long since she turned O2 to 3 liters. We have documented [name] to be on 2 liters. Please advise if okay RB? thanks

## 2013-08-16 NOTE — Telephone Encounter (Signed)
Spoke with the pt and notified recs per RB Order was sent to [facility]  Nothing further needed per pt

## 2013-08-16 NOTE — Telephone Encounter (Signed)
It is Ok to turn up to 3 w exertion and also to get a better tank. thanks

## 2013-08-17 ENCOUNTER — Telehealth: Payer: Self-pay | Admitting: Emergency Medicine

## 2013-08-17 NOTE — Telephone Encounter (Signed)
lmtcb cindy

## 2013-08-20 ENCOUNTER — Ambulatory Visit (HOSPITAL_COMMUNITY)
Admission: RE | Admit: 2013-08-20 | Discharge: 2013-08-20 | Disposition: A | Discharge status: Home / Self Care | Payer: Medicare Other | Source: Ambulatory Visit | Attending: Internal Medicine | Admitting: Internal Medicine

## 2013-08-20 DIAGNOSIS — K279 Peptic ulcer, site unspecified, unspecified as acute or chronic, without hemorrhage or perforation: Secondary | ICD-10-CM

## 2013-08-20 DIAGNOSIS — R110 Nausea: Secondary | ICD-10-CM | POA: Insufficient documentation

## 2013-08-20 DIAGNOSIS — R141 Gas pain: Secondary | ICD-10-CM | POA: Diagnosis not present

## 2013-08-20 DIAGNOSIS — R109 Unspecified abdominal pain: Secondary | ICD-10-CM | POA: Diagnosis not present

## 2013-08-20 DIAGNOSIS — R142 Eructation: Secondary | ICD-10-CM

## 2013-08-20 DIAGNOSIS — R143 Flatulence: Secondary | ICD-10-CM

## 2013-08-20 DIAGNOSIS — R6881 Early satiety: Secondary | ICD-10-CM | POA: Diagnosis not present

## 2013-08-20 MED ORDER — TECHNETIUM TC 99M SULFUR COLLOID
2.2000 | Freq: Once | INTRAVENOUS | Status: AC | PRN
  Administered 2013-08-20: 2.2 via INTRAVENOUS

## 2013-08-20 NOTE — Telephone Encounter (Signed)
Pt returning call cn b reached @ [phone].[name]

## 2013-08-20 NOTE — Telephone Encounter (Signed)
Pt last seen 04-2013 and sats were not done at that time. Per ov note the pt is due for an OV.  I called and [id number] to schedule the pt for an appt with RB this month. I called and made cinidi at [facility] aware of this. Eloy Bing, CMA

## 2013-08-20 NOTE — Telephone Encounter (Signed)
Spoke with the pt and notified needs appt to recert for o2  She verbalized understanding  Will keep planned ov with RB on 08/31/13

## 2013-08-21 ENCOUNTER — Telehealth: Payer: Self-pay | Admitting: Emergency Medicine

## 2013-08-21 NOTE — Telephone Encounter (Signed)
Pt has an appt on 08-31-13 and advised Jenny Reichmann we will test her at that time for any oxygen changes. She states she will note this in her chart. Acequia Bing, CMA

## 2013-08-22 ENCOUNTER — Other Ambulatory Visit: Payer: Self-pay | Admitting: Internal Medicine

## 2013-08-30 ENCOUNTER — Telehealth: Payer: Self-pay | Admitting: *Deleted

## 2013-08-30 NOTE — Telephone Encounter (Signed)
Patient wanted to know if you filled out the forms for her batteries for the Wheelchair.She stated that they will also need a reason of why she needs a wheelchair. Needs it faxed back. Please Advise.

## 2013-08-31 ENCOUNTER — Ambulatory Visit (INDEPENDENT_AMBULATORY_CARE_PROVIDER_SITE_OTHER): Payer: Medicare Other | Admitting: Emergency Medicine

## 2013-08-31 ENCOUNTER — Encounter: Payer: Self-pay | Admitting: Emergency Medicine

## 2013-08-31 ENCOUNTER — Telehealth: Payer: Self-pay | Admitting: Emergency Medicine

## 2013-08-31 VITALS — BP 140/70 | HR 112 | Ht 66.0 in | Wt 191.0 lb

## 2013-08-31 DIAGNOSIS — J449 Chronic obstructive pulmonary disease, unspecified: Secondary | ICD-10-CM

## 2013-08-31 DIAGNOSIS — J4489 Other specified chronic obstructive pulmonary disease: Secondary | ICD-10-CM | POA: Diagnosis not present

## 2013-08-31 DIAGNOSIS — J329 Chronic sinusitis, unspecified: Secondary | ICD-10-CM | POA: Diagnosis not present

## 2013-08-31 MED ORDER — AZITHROMYCIN 250 MG PO TABS: 250.0000 mg | ORAL_TABLET | Freq: Every day | ORAL | Status: DC

## 2013-08-31 NOTE — Progress Notes (Signed)
Subjective:    Patient ID: [name], female    DOB: 1945/11/24, 68 y.o.   MRN: [id number] HPI History of Present Illness:  [name] is a 68 y.o. 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 Has received XRT and chemo with Drs Pablo Ledger and Julien Nordmann.  She tolerated XRT completely. She has completed 3 cycles chemo.  Currently managed on combivent qid, started O2.   ROV 11/26/11 -- COPD with adenoCA 12/12 (chemo/XRT).  Presents today reporting R back and shoulder pain - is musculoskeletal, is involving R chest and R shoulder as well. She was exerting herself 3 weeks ago, believes she was sore afterwards and now with muscle pain. Dr Guinevere Ferrari gave her flexeril. [facility] Dr Hardin Negus. She is having more cough, which is exacerbating her back and shoulder pain.   ROV 03/03/12 -- COPD with adenoCA 12/12 (chemo/XRT). Currently on combivent, taking it prn. 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 Stable CT scan on 01/17/12. She is having more SOB. Was started on avelox recently for sinusitis, no other exacerbations. Marginal compliance with her O2  ROV 05/16/12 -- COPD with adenoCA 12/12. She developed URI sx, nasal obstruction, clear to yellow nasal congestion, scratchy throat. She tried restarting restarting claritin and mucinex-D a couple weeks ago. She has been using both prn - little improvement. She has a throat trickle, cough that is sometimes productive - white, yellow, clear. Using combivent twice a day for last two weeks > helps her cough.   Acute OV 12/27/12 -- COPD with adenoCA 12/12 (chemo/XRT). For last 2 weeks has had nasal drainage, low energy, cough usually non-prod, more indigestion. Was treated w Augmentin. She started mucinex DM x 2 weeks. She stopped loratadine 3 weeks ago because pressure in her head. Last CT scan was in 07/2012. Due to see Dr Julien Nordmann next month.   ROV 05/01/13 -- COPD with adenoCA  12/12 (chemo/XRT). Her last CT scan was done 01/22/13, read as stable from CA standpoint. She is still dealing with cough, dyspnea and a smothering feeling. She wears O2 prn, with most exertion, at 2.5L/min. She is using combivent about 4x a day.   ROV 08/31/13 -- COPD with adenoCA 12/12 (chemo/XRT). CT scan 08/23/13 was stable. O2 dependent w exertion.  She has baseline exertional SOB. starting about 6 weeks ago began to have nasal drainage, green mucous. She changed from loratadine to zyrtec-D. Uses nasal saline spray. Has never tolerated other nasal sprays due to dryness. Uses combivent qid.       Objective:   Physical Exam Filed Vitals:   08/31/13 1409  BP: 140/70  Pulse: 112  Height: 5\' 6"  (1.676 m)  Weight: 191 lb (86.637 kg)  SpO2: 93%   Gen: Pleasant, elderly woman, in no distress,  normal affect  ENT: No lesions,  mouth clear,  oropharynx clear, no postnasal drip  Neck: No JVD, no TMG, no carotid bruits  Lungs: No use of accessory muscles, distant, no wheezes or crackles  Cardiovascular: RRR, heart sounds normal, no murmur or gallops, no peripheral edema  Musculoskeletal: No deformities, no cyanosis or clubbing  Neuro: alert, non focal  Skin: Warm, no lesions or rashes   07/24/13 --  COMPARISON: 01/22/2013  FINDINGS:  There is no pleural effusion identified. Centrilobular emphysema.  Consolidation, radiation fibrosis and volume loss are seen in the  right perihilar region with involvement of primarily the  right upper  lobe. This is unchanged in appearance from the previous exam.  Ground-glass attenuation in the right middle lobe measures 5.7 cm,  image 25/series 7 and appears unchanged from the previous exam.  Anterior right middle lobe nodule is unchanged at 6 mm, image  29/series 7. There are postoperative changes from left upper  lobectomy. Nonspecific area of ground-glass attenuation in the left  lower lobe appears stable from previous exam, image 26/series 7.   The heart size appears normal. There is no pericardial effusion.  Calcified atherosclerotic disease involves the thoracic aorta. There  are calcifications involving the LAD Coronary artery.  Incidental imaging through the upper abdomen shows mild diffuse low  attenuation within the liver parenchyma. There is a nodule in the  right adrenal gland which measures 1.5 cm, image 57/series 2. This  is unchanged from previous exam. Left adrenal nodule is stable  measuring 1.3 cm, image 57/series 2.  Review of the visualized bony structures shows stable compression  deformities involving T4 and T5. The T4 vertebra has been treated  with bone cement. Next  IMPRESSION:  1. Stable postoperative and post treatment changes in the lungs  bilaterally.  2. No change in patchy area of ground-glass attenuation in the right  hemi thorax.  3. Resolution of small right pleural effusion.  4. Hepatic steatosis.  5. Stable bilateral adrenal nodules.  6. No change in T4 and T5 compression fractures      Assessment & Plan:  COPD Currently stable on combivent, O2  Sinusitis Suspect this is allergic rhinitis. She doesn't want to start a nasal steroid because she gets dryness.  - azithromycin - nasal hygeine

## 2013-08-31 NOTE — Assessment & Plan Note (Signed)
Currently stable on combivent, O2

## 2013-08-31 NOTE — Patient Instructions (Signed)
Take azithromycin for 5 days. We will give you a script for diflucan also  Continue your combivent 4x a day Continue your zyrtec-D Follow with Dr Lamonte Sakai in 4 months or sooner if you have any problems.

## 2013-08-31 NOTE — Telephone Encounter (Signed)
lmomtcb x1 for pt RX for zpak and diflucan was sent to [place].

## 2013-08-31 NOTE — Assessment & Plan Note (Signed)
Suspect this is allergic rhinitis. She doesn't want to start a nasal steroid because she gets dryness.  - azithromycin - nasal hygeine

## 2013-09-04 NOTE — Telephone Encounter (Signed)
Pt advised rx sent. Pt then mentioned that she spoke with RB at visit about him doing a letter stating that she needs 80 hours of a nursing aide at home. I do not see any mention of this. Pt wants letter mailed to her.  Please advise. Williamson Bing, CMA

## 2013-09-05 ENCOUNTER — Encounter: Payer: Self-pay | Admitting: Emergency Medicine

## 2013-09-05 MED ORDER — FLUCONAZOLE 100 MG PO TABS: 100.0000 mg | ORAL_TABLET | Freq: Every day | ORAL | Status: DC

## 2013-09-05 NOTE — Telephone Encounter (Signed)
Form was completed and faxed today, 09/05/13.

## 2013-09-05 NOTE — Telephone Encounter (Signed)
Letter finished

## 2013-09-05 NOTE — Telephone Encounter (Signed)
Letter placed to be mailed to patient home address. Pt aware. Nothing further needed.

## 2013-09-05 NOTE — Telephone Encounter (Signed)
Patient Notified

## 2013-09-11 DIAGNOSIS — Z029 Encounter for administrative examinations, unspecified: Secondary | ICD-10-CM

## 2013-09-12 ENCOUNTER — Encounter: Payer: Self-pay | Admitting: Internal Medicine

## 2013-09-13 ENCOUNTER — Other Ambulatory Visit: Payer: Self-pay | Admitting: Internal Medicine

## 2013-09-17 ENCOUNTER — Other Ambulatory Visit: Payer: Self-pay | Admitting: *Deleted

## 2013-09-17 DIAGNOSIS — M549 Dorsalgia, unspecified: Secondary | ICD-10-CM

## 2013-09-17 MED ORDER — HYDROCODONE-ACETAMINOPHEN 7.5-325 MG PO TABS
ORAL_TABLET | ORAL | Status: DC
Start: 2013-09-17 — End: 2013-10-10

## 2013-09-19 ENCOUNTER — Other Ambulatory Visit: Payer: Self-pay | Admitting: Internal Medicine

## 2013-09-26 ENCOUNTER — Ambulatory Visit: Payer: Medicare Other | Admitting: Internal Medicine

## 2013-10-08 ENCOUNTER — Telehealth: Payer: Self-pay | Admitting: *Deleted

## 2013-10-08 NOTE — Telephone Encounter (Signed)
Patient called and stated that she was running a fever and had a sinus infection. No appointments available for today or tomorrow and advised patient to go to the Urgent Care, but patient refused and made an appointment with Dr. Nyoka Cowden on Wednesday.

## 2013-10-10 ENCOUNTER — Encounter: Payer: Self-pay | Admitting: Internal Medicine

## 2013-10-10 ENCOUNTER — Ambulatory Visit (INDEPENDENT_AMBULATORY_CARE_PROVIDER_SITE_OTHER): Payer: Medicare Other | Admitting: Internal Medicine

## 2013-10-10 VITALS — BP 150/64 | HR 85 | Temp 98.5°F | Wt 190.0 lb

## 2013-10-10 DIAGNOSIS — J988 Other specified respiratory disorders: Secondary | ICD-10-CM | POA: Diagnosis not present

## 2013-10-10 DIAGNOSIS — M545 Low back pain, unspecified: Secondary | ICD-10-CM | POA: Diagnosis not present

## 2013-10-10 DIAGNOSIS — J449 Chronic obstructive pulmonary disease, unspecified: Secondary | ICD-10-CM | POA: Diagnosis not present

## 2013-10-10 DIAGNOSIS — J4489 Other specified chronic obstructive pulmonary disease: Secondary | ICD-10-CM

## 2013-10-10 DIAGNOSIS — J22 Unspecified acute lower respiratory infection: Secondary | ICD-10-CM

## 2013-10-10 MED ORDER — HYDROCODONE-ACETAMINOPHEN 7.5-325 MG PO TABS: ORAL_TABLET | ORAL | Status: DC

## 2013-10-10 MED ORDER — DM-GUAIFENESIN ER 30-600 MG PO TB12: ORAL_TABLET | ORAL | Status: DC

## 2013-10-10 MED ORDER — DOXYCYCLINE HYCLATE 100 MG PO TABS: ORAL_TABLET | ORAL | Status: DC

## 2013-10-10 NOTE — Progress Notes (Signed)
Patient ID: [name], female   DOB: February 07, 1946, 68 y.o.   MRN: [id number]    Location:    [facility]  Place of Service:  OFFICE    Allergies  Allergen Reactions  . Iohexol Shortness Of Breath and Other (See Comments)    "burns me up inside"    Pt does fine with 13 hour prep  01/17/12  . Other     CAN'T STAND THE SMELL OF PURE BLEACH; "HAVE TO BACK UP AND POUR AND DON'T BREATH IT TIL i GET CAP BACK ON; DILUTE IT TO SPRAY"  . Codeine Itching and Nausea And Vomiting    REACTION: GI upset  . Protonix [Pantoprazole Sodium] Diarrhea    Chief Complaint  Patient presents with  . Sinus Problem     for one month nose stopped up, coughing white/green phlegm occasionally . Pain left back, ears and throat hurt.     HPI:  Patient is here for an acute visit regarding an upper respiratory infection. She complains of nasal congestion, cough producing white and green phlegm, sinus tenderness, ear discomfort, and a sore throat. She has pain in her left back. She has a known history of COPD and recurrent infections. She denies fever or chills. She has not had nausea or palpitations.  Medications: Patient's Medications  New Prescriptions   No medications on file  Previous Medications   ALBUTEROL-IPRATROPIUM (COMBIVENT) 18-103 MCG/ACT INHALER    every 4 (four) hours as needed. 2 puffs every for hours for lung cancer   ALPRAZOLAM (XANAX) 1 MG TABLET    TAKE 1 TABLET BY MOUTH THREE TIMES A DAY AS NEEDED FOR ANXIETY   AMBULATORY NON FORMULARY MEDICATION    Lighter Weight Rollator with Padded Seat and Hand Brakes Dx: 496, 729.1, 722.52   ASPIRIN EC 325 MG TABLET    Take 325 mg by mouth daily.   AZITHROMYCIN (ZITHROMAX) 250 MG TABLET    Take 1 tablet (250 mg total) by mouth daily.   CALCIUM CITRATE-VITAMIN D (CITRACAL + D PO)    Take 2 tablets by mouth daily.   CITALOPRAM (CELEXA) 20 MG TABLET    One daily to help mood.   COMBIVENT RESPIMAT 20-100 MCG/ACT AERS RESPIMAT    INHALE 1 PUFF BY MOUTH EVERY 6  HOURS AS NEEDED FOR SHORTNESS OF BREATH   DILTIAZEM (CARDIZEM CD) 240 MG 24 HR CAPSULE    TAKE 1 CAPSULE BY MOUTH EVERY DAY FOR BLOOD PRESSURE   FLUCONAZOLE (DIFLUCAN) 100 MG TABLET    Take 1 tablet (100 mg total) by mouth daily.   GLUCOSE BLOOD (FREESTYLE LITE) TEST STRIP    Use as instructed for Diabetes 250.00   HEMOCYTE-F 324-1 MG TABS    TAKE 1 TABLET BY MOUTH DAILY   HYDROCODONE-ACETAMINOPHEN (NORCO) 7.5-325 MG PER TABLET    Take 1 tablet by mouth every 6 hours as needed for severe pain   LANCETS (FREESTYLE) LANCETS    Use as instructed for Diabetes   LIDOCAINE (LIDODERM) 5 %    Place 1 patch onto the skin daily as needed. For shoulder pain.Remove & Discard patch within 12 hours or as directed by MD.   LORATADINE (CLARITIN) 10 MG TABLET    Take 1 tablet (10 mg total) by mouth daily.   LOSARTAN (COZAAR) 50 MG TABLET    Take one tablet by mouth twice daily   LOSARTAN (COZAAR) 50 MG TABLET    TAKE 1 TABLET BY MOUTH TWICE DAILY   METFORMIN (GLUCOPHAGE)  500 MG TABLET    Take 1 tablet twice daily for Diabetes   METOCLOPRAMIDE (REGLAN) 5 MG TABLET    Take 1 tablet (5 mg total) by mouth 4 (four) times daily.   MULTIPLE VITAMINS-MINERALS (CENTRUM SILVER PO)    Take 1 tablet by mouth daily.   POTASSIUM CHLORIDE (K-DUR,KLOR-CON) 10 MEQ TABLET    TAKE 1 TABLET BY MOUTH EVERY DAY   RANITIDINE (ZANTAC) 300 MG TABLET    Take 1 tablet (300 mg total) by mouth at bedtime.   SENNA (SENOKOT) 8.6 MG TABS    Take 1 tablet by mouth at bedtime and may repeat dose one time if needed. For constipation.   SIMETHICONE (GAS-X EXTRA STRENGTH PO)    Take 1 tablet by mouth at bedtime as needed. As needed for gas.   TRAMADOL (ULTRAM) 50 MG TABLET    TAKE 1 TABLET BY MOUTH THREE TIMES A DAY AS NEEDED FOR PAIN   TRIAMCINOLONE OINTMENT (KENALOG) 0.1 %    RX was sent on 01-10-2013 with 5 refills   TRIAMTERENE-HYDROCHLOROTHIAZIDE (MAXZIDE-25) 37.5-25 MG PER TABLET    TAKE 1 TABLET BY MOUTH EVERY DAY TO CONTROL BLOOD PRESSURE    Modified Medications   No medications on file  Discontinued Medications   No medications on file     Review of Systems  Constitutional: Positive for activity change. Negative for fever and chills.  HENT: Positive for ear pain (right side), sinus pressure, sore throat and trouble swallowing. Negative for congestion.   Eyes: Negative.   Respiratory: Positive for cough and shortness of breath. Negative for choking, chest tightness, wheezing and stridor.   Cardiovascular: Negative for chest pain and palpitations.  Gastrointestinal: Positive for constipation. Negative for nausea, abdominal pain, diarrhea and abdominal distention.       Reflux frequently  Musculoskeletal: Positive for arthralgias, back pain, gait problem, myalgias and neck pain.  Skin: Negative.   Neurological: Positive for dizziness, weakness (generalized) and light-headedness. Negative for tremors and syncope.  Hematological: Negative.   Psychiatric/Behavioral: Positive for sleep disturbance and dysphoric mood. Negative for hallucinations. The patient is not nervous/anxious and is not hyperactive.     Filed Vitals:   10/10/13 1604  BP: 150/64  Pulse: 85  Temp: 98.5 F (36.9 C)  TempSrc: Oral  Weight: 190 lb (86.183 kg)  SpO2: 90%   Body mass index is 30.68 kg/(m^2).  Physical Exam  Constitutional: She is oriented to person, place, and time.  overweight  HENT:  Nose: Nose normal.  Mouth/Throat: Oropharynx is clear and moist. No oropharyngeal exudate.  Tympanic membranes have a pink blush on both sides.  Eyes: Conjunctivae and EOM are normal. Pupils are equal, round, and reactive to light.  Neck: No JVD present. No tracheal deviation present. No thyromegaly present.  Cardiovascular: Normal rate, regular rhythm and normal heart sounds.  Exam reveals no gallop and no friction rub.   No murmur heard. Pulmonary/Chest: No respiratory distress. She has no wheezes. She has no rales. She exhibits no tenderness.   Abdominal: She exhibits no distension and no mass. There is no tenderness.  Musculoskeletal: Normal range of motion. She exhibits edema and tenderness.  Lymphadenopathy:    She has no cervical adenopathy.  Neurological: She is oriented to person, place, and time. No cranial nerve deficit. Coordination normal.  Skin: No rash noted. No erythema. No pallor.  Psychiatric: Her behavior is normal. Judgment and thought content normal.     Labs reviewed: Office Visit on 08/01/2013  Component  Date Value Ref Range Status  . H Pylori IgG 08/01/2013 <0.9  0.0 - 0.8 U/mL Final   Comment:                              Negative            <0.9                                                       Indeterminate  0.9 - 1.0                                                       Positive            >1.0  Appointment on 07/24/2013  Component Date Value Ref Range Status  . Sodium 07/24/2013 141  136 - 145 mEq/L Final  . Potassium 07/24/2013 4.2  3.5 - 5.1 mEq/L Final  . Chloride 07/24/2013 102  98 - 109 mEq/L Final  . CO2 07/24/2013 23  22 - 29 mEq/L Final  . Glucose 07/24/2013 187* 70 - 140 mg/dl Final  . BUN 07/24/2013 19.9  7.0 - 26.0 mg/dL Final  . Creatinine 07/24/2013 1.3* 0.6 - 1.1 mg/dL Final  . Total Bilirubin 07/24/2013 0.30  0.20 - 1.20 mg/dL Final  . Alkaline Phosphatase 07/24/2013 104  40 - 150 U/L Final  . AST 07/24/2013 17  5 - 34 U/L Final  . ALT 07/24/2013 16  0 - 55 U/L Final  . Total Protein 07/24/2013 7.8  6.4 - 8.3 g/dL Final  . Albumin 07/24/2013 4.3  3.5 - 5.0 g/dL Final  . Calcium 07/24/2013 10.6* 8.4 - 10.4 mg/dL Final  . Anion Gap 07/24/2013 16* 3 - 11 mEq/L Final  . WBC 07/24/2013 10.8* 3.9 - 10.3 10e3/uL Final  . NEUT# 07/24/2013 9.5* 1.5 - 6.5 10e3/uL Final  . HGB 07/24/2013 11.3* 11.6 - 15.9 g/dL Final  . HCT 07/24/2013 34.2* 34.8 - 46.6 % Final  . Platelets 07/24/2013 384  145 - 400 10e3/uL Final  . MCV 07/24/2013 91.4  79.5 - 101.0 fL Final  . MCH 07/24/2013 30.1   25.1 - 34.0 pg Final  . MCHC 07/24/2013 33.0  31.5 - 36.0 g/dL Final  . RBC 07/24/2013 3.74  3.70 - 5.45 10e6/uL Final  . RDW 07/24/2013 13.3  11.2 - 14.5 % Final  . lymph# 07/24/2013 1.1  0.9 - 3.3 10e3/uL Final  . MONO# 07/24/2013 0.1  0.1 - 0.9 10e3/uL Final  . Eosinophils Absolute 07/24/2013 0.0  0.0 - 0.5 10e3/uL Final  . Basophils Absolute 07/24/2013 0.1  0.0 - 0.1 10e3/uL Final  . NEUT% 07/24/2013 88.1* 38.4 - 76.8 % Final  . LYMPH% 07/24/2013 10.4* 14.0 - 49.7 % Final  . MONO% 07/24/2013 0.9  0.0 - 14.0 % Final  . EOS% 07/24/2013 0.0  0.0 - 7.0 % Final  . BASO% 07/24/2013 0.6  0.0 - 2.0 % Final      Assessment/Plan  1. Acute respiratory infection -Doxycycline 100 mg; one twice daily for 7 days for infection - dextromethorphan-guaiFENesin ([place] DM) 30-600 MG per  12 hr tablet; One every 12 hours to help congestion and to suppress cough  Dispense: 60 tablet; Refill: 3  2. Midline low back pain without sciatica - HYDROcodone-acetaminophen (NORCO) 7.5-325 MG per tablet; Take 1 tablet by mouth every 6 hours as needed for severe pain  Dispense: 120 tablet; Refill: 0  3. COPD Continue Combivent and loratadine

## 2013-10-11 ENCOUNTER — Telehealth: Payer: Self-pay

## 2013-10-11 ENCOUNTER — Other Ambulatory Visit: Payer: Self-pay

## 2013-10-11 DIAGNOSIS — J22 Unspecified acute lower respiratory infection: Secondary | ICD-10-CM

## 2013-10-11 MED ORDER — DOXYCYCLINE HYCLATE 100 MG PO TABS: ORAL_TABLET | ORAL | Status: DC

## 2013-10-11 NOTE — Telephone Encounter (Signed)
I called patient to inform her that PA was received from [facility] for Freestyle Lite Strips.Patient will need to contact her insurance company to see which DM testing supplies they will cover.  Patient states she already received strips from [facility], patient thinks we received this in error.  I called [facility] and rx went through. Doug at the pharmacy indicated that we need to make sure instructions for DM testing supplies is very specific and include diagnosis. Noted

## 2013-10-11 NOTE — Telephone Encounter (Signed)
Patient called to inquire about rx (doxycycline)  that was to be sent to [facility]. I reviewed refill history, rx was sent to [facility]. Patient aware I will cancel rx at [facility] and send it electronically to [facility].  RX corrected

## 2013-10-16 ENCOUNTER — Ambulatory Visit: Payer: Medicare Other | Admitting: Internal Medicine

## 2013-10-17 ENCOUNTER — Other Ambulatory Visit: Payer: Self-pay | Admitting: Internal Medicine

## 2013-10-17 DIAGNOSIS — C3490 Malignant neoplasm of unspecified part of unspecified bronchus or lung: Secondary | ICD-10-CM

## 2013-10-22 ENCOUNTER — Telehealth: Payer: Self-pay | Admitting: Emergency Medicine

## 2013-10-22 NOTE — Telephone Encounter (Signed)
Patient has been added on to see Dr Lamonte Sakai 10/23/13 at 130 Pt requested to be seen by our office tomorrow. Does not want to see PCP for this as it is possibly "related to her cancer" Pt refused pain med rx as she has some Vicodin at home she can use.   Nothing further needed.

## 2013-10-22 NOTE — Telephone Encounter (Signed)
This will need to be evaluated either by [place] or her PCP, please have her make appointment w TP or with her Primary. We can give her vicodin 7.5mg /325mg , #30, 0RF, but she would have to come pick the prescription up. Alternatively she could do ibuprofen

## 2013-10-22 NOTE — Telephone Encounter (Signed)
Pt c/o Right arm pain and discomfort in chest when moving this arm Pt states that she has R lung CA and is concerned the the two are related States that when she lifts/moves her right arm there is pulling and aching sensation which radiates to chest where tumor is located. Pt is wondering if something can be called in to help relieve these symptoms. Advised Dr Lamonte Sakai working in hospital today, will send him message. Pt did not want to wait until RB back in office tomorrow.  Allergies  Allergen Reactions  . Iohexol Shortness Of Breath and Other (See Comments)    "burns me up inside"    Pt does fine with 13 hour prep  01/17/12  . Other     CAN'T STAND THE SMELL OF PURE BLEACH; "HAVE TO BACK UP AND POUR AND DON'T BREATH IT TIL i GET CAP BACK ON; DILUTE IT TO SPRAY"  . Codeine Itching and Nausea And Vomiting    REACTION: GI upset  . Protonix [Pantoprazole Sodium] Diarrhea   Please advise Dr Lamonte Sakai. Thanks.

## 2013-10-23 ENCOUNTER — Ambulatory Visit (INDEPENDENT_AMBULATORY_CARE_PROVIDER_SITE_OTHER): Payer: Medicare Other | Admitting: Nurse Practitioner

## 2013-10-23 ENCOUNTER — Ambulatory Visit (INDEPENDENT_AMBULATORY_CARE_PROVIDER_SITE_OTHER): Payer: Medicare Other | Admitting: Emergency Medicine

## 2013-10-23 ENCOUNTER — Encounter: Payer: Self-pay | Admitting: Emergency Medicine

## 2013-10-23 VITALS — BP 130/60 | HR 88 | Ht 66.0 in | Wt 185.8 lb

## 2013-10-23 VITALS — BP 140/70 | HR 94 | Ht 65.0 in | Wt 187.8 lb

## 2013-10-23 DIAGNOSIS — R079 Chest pain, unspecified: Secondary | ICD-10-CM

## 2013-10-23 DIAGNOSIS — R0789 Other chest pain: Secondary | ICD-10-CM | POA: Diagnosis not present

## 2013-10-23 DIAGNOSIS — I10 Essential (primary) hypertension: Secondary | ICD-10-CM

## 2013-10-23 DIAGNOSIS — J4489 Other specified chronic obstructive pulmonary disease: Secondary | ICD-10-CM

## 2013-10-23 DIAGNOSIS — I259 Chronic ischemic heart disease, unspecified: Secondary | ICD-10-CM | POA: Diagnosis not present

## 2013-10-23 DIAGNOSIS — J329 Chronic sinusitis, unspecified: Secondary | ICD-10-CM

## 2013-10-23 DIAGNOSIS — J449 Chronic obstructive pulmonary disease, unspecified: Secondary | ICD-10-CM

## 2013-10-23 MED ORDER — SENNA 8.6 MG PO TABS: 1.0000 | ORAL_TABLET | Freq: Every evening | ORAL | Status: DC | PRN

## 2013-10-23 NOTE — Assessment & Plan Note (Signed)
-   trial changing combivent to anoro, rov to assess effects

## 2013-10-23 NOTE — Patient Instructions (Signed)
Please continue your mucinex D and loratadine Start fluticasone nasal spray, 2 sprays each nostril daily Stop Combivent for now.  We will try Anoro on inhalation once a day Wear your oxygen at all times on 3L/min Follow with Dr Lamonte Sakai in 1 month

## 2013-10-23 NOTE — Assessment & Plan Note (Signed)
Add nasal steroid to mucinex D and loratadine

## 2013-10-23 NOTE — Patient Instructions (Signed)
We will get an ultrasound of your heart set up  Stay on your current medicines  Let us know if you continue to have chest pain - it sounds like it is more related to the radiation treatment you have had  See Dr. Burt Knack in 6 months  Call the [place] office at [phone] if you have any questions, problems or concerns.

## 2013-10-23 NOTE — Progress Notes (Signed)
[name] Date of Birth: July 17, 1945 Medical Record #[id number]  History of Present Illness: [name] is seen back today for a one year check. Seen for Dr. Burt Knack. She has CAD with nonobstructive disease noted by cath in 2006 - plaque in the LAD with wide patency of the LCX and RCA. EF normal. Other issues include PVD - followed by Dr. Oneida Alar, chronic leg pain, HTN, HLD, anemia, GERD, DM and COPD.  She has a history of stage IIB/IIIA non-small cell lung cancer status post concurrent chemoradiation followed by consolidation chemotherapy with carboplatin and Alimta and has been observation since April 2013. She has no evidence for disease progression on his recent scan. Sees Dr. Elder Negus for her primary care.   Last here a year ago. Cardiac status seemed stable.  Comes back today. Here alone. Doing ok. No chest pain. Has had some pains in her right breast - clearly worse with moving her right arm. Has had some pain over the left breast that radiated over to the right. Tells me she has had radiation to both sides. Mostly limited by her lungs - so short of breath with minimal activity. Seems to be getting worse. No energy. Has had recent bout of sinusitis/bronchitis. Seeing [name] later today. Some swelling in her legs - mostly at night - looks better by the next morning. Not active. Remains on continuous oxygen.   Current Outpatient Prescriptions  Medication Sig Dispense Refill  . albuterol-ipratropium (COMBIVENT) 18-103 MCG/ACT inhaler every 4 (four) hours as needed. 2 puffs every for hours for lung cancer      . ALPRAZolam (XANAX) 1 MG tablet TAKE 1 TABLET BY MOUTH THREE TIMES A DAY AS NEEDED ANXIETY  60 tablet  2  . aspirin EC 325 MG tablet Take 325 mg by mouth daily.      . Calcium Citrate-Vitamin D (CITRACAL + D PO) Take 2 tablets by mouth daily.      . citalopram (CELEXA) 20 MG tablet One daily to help mood.  90 tablet  3  . COMBIVENT RESPIMAT 20-100 MCG/ACT AERS respimat INHALE 1  PUFF BY MOUTH EVERY 6 HOURS AS NEEDED FOR SHORTNESS OF BREATH  12 g  5  . dextromethorphan-guaiFENesin (MUCINEX DM) 30-600 MG per 12 hr tablet One every 12 hours to help congestion and to suppress cough  60 tablet  3  . diltiazem (CARDIZEM CD) 240 MG 24 hr capsule TAKE 1 CAPSULE BY MOUTH EVERY DAY FOR BLOOD PRESSURE  90 capsule  1  . HEMATINIC/FOLIC ACID 716-9 MG TABS TAKE 1 TABLET BY MOUTH DAILY  30 each  4  . HYDROcodone-acetaminophen (NORCO) 7.5-325 MG per tablet Take 1 tablet by mouth every 6 hours as needed for severe pain  120 tablet  0  . loratadine (CLARITIN) 10 MG tablet Take 1 tablet (10 mg total) by mouth daily.  30 tablet  11  . losartan (COZAAR) 50 MG tablet Take one tablet by mouth twice daily  60 tablet  5  . metFORMIN (GLUCOPHAGE) 500 MG tablet TAKE 1 TABLET BY MOUTH 2 TIMES A DAY  60 tablet  3  . metoCLOPramide (REGLAN) 5 MG tablet TAKE 1 TABLET BY MOUTH 4 TIMES A DAY  120 tablet  1  . Multiple Vitamins-Minerals (CENTRUM SILVER PO) Take 1 tablet by mouth daily.      . NON FORMULARY Place 3 L into the nose daily. 3 liter at home 2 liters when pt is out      .  potassium chloride (K-DUR,KLOR-CON) 10 MEQ tablet TAKE 1 TABLET BY MOUTH EVERY DAY  30 tablet  5  . ranitidine (ZANTAC) 300 MG tablet TAKE 1 TABLET BY MOUTH EVERY NIGHT AT BEDTIME  30 tablet  3  . senna (SENOKOT) 8.6 MG TABS Take 1 tablet by mouth at bedtime and may repeat dose one time if needed. For constipation.      . Simethicone (GAS-X EXTRA STRENGTH PO) Take 1 tablet by mouth at bedtime as needed. As needed for gas.      . traMADol (ULTRAM) 50 MG tablet TAKE 1 TABLET BY MOUTH THREE TIMES A DAY AS NEEDED FOR PAIN  90 tablet  0  . triamcinolone ointment (KENALOG) 0.1 % RX was sent on 01-10-2013 with 5 refills  60 g  5  . triamterene-hydrochlorothiazide (MAXZIDE-25) 37.5-25 MG per tablet TAKE 1 TABLET BY MOUTH EVERY DAY TO CONTROL BLOOD PRESSURE  30 tablet  3  . AMBULATORY NON FORMULARY MEDICATION Lighter Weight Rollator with  Padded Seat and Hand Brakes Dx: 496, 729.1, 722.52  1 each  0  . glucose blood test strip Check blood sugar once daily  DX: 250.00      . lidocaine (LIDODERM) 5 % Place 1 patch onto the skin daily as needed. For shoulder pain.Remove & Discard patch within 12 hours or as directed by MD.  30 patch  0  . [DISCONTINUED] calcium carbonate 200 MG capsule Take 250 mg by mouth daily. Does not take everyday       No current facility-administered medications for this visit.   Facility-Administered Medications Ordered in Other Visits  Medication Dose Route Frequency Provider Last Rate Last Dose  . hyaluronate sodium (RADIAPLEXRX) gel   Topical Once Thea Silversmith, MD      . topical emolient (BIAFINE) emulsion   Topical PRN Thea Silversmith, MD        Allergies  Allergen Reactions  . Iohexol Shortness Of Breath and Other (See Comments)    "burns me up inside"    Pt does fine with 13 hour prep  01/17/12  . Other     CAN'T STAND THE SMELL OF PURE BLEACH; "HAVE TO BACK UP AND POUR AND DON'T BREATH IT TIL i GET CAP BACK ON; DILUTE IT TO SPRAY"  . Codeine Itching and Nausea And Vomiting    REACTION: GI upset  . Protonix [Pantoprazole Sodium] Diarrhea    Past Medical History  Diagnosis Date  . 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 Restless legs syndrome (RLS)   . Chronic sinusitis   . Hypertension   . Hyperlipidemia   . Anemia, unspecified   . GERD (gastroesophageal reflux disease)   . Respiratory failure with hypoxia 01/02/2010  . Complication of anesthesia     slow to wake up  . Asthma   . Coronary artery disease     Dr. Burt Knack had stress test done  . Type II or unspecified type diabetes mellitus without mention of complication, not stated as uncontrolled   . Peripheral vascular disease   . PAD (peripheral artery disease)   . Heart murmur   . Dysrhythmia     irregular  . Anginal pain   . History of blood clots     "2 in my aorta"  . Hard of hearing, right   . History of bronchitis     . Bronchial pneumonia     history  . COPD (chronic obstructive pulmonary disease)   . Emphysema   . Shortness of breath     "  anytime really"  . Syncope and collapse   . Adenocarcinoma, lung     upper left lobe  . 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 Chemotherapy adverse reaction 09/15/11    "makes me light headed and pass out; this is 3rd blood transfusion since going thru it"  . History of blood transfusion   . Kidney infection     "related to chemo"  . Sinus headache     "all the time"  . Arthritis     "all over my body"  . Fibromyalgia   . Osteoporosis   . DDD (degenerative disc disease), lumbar   . Scoliosis   . Anxiety   . Mental disorder   . Reflux   . CHF (congestive heart failure)   . DVT (deep venous thrombosis)   . Hx of radiation therapy 04/28/11 -06/08/11    right upper lobe-lung  . Acute upper respiratory infections of unspecified site   . Pain in thoracic spine   . Asymptomatic varicose veins   . Functional urinary incontinence   . Diseases of lips   . Other abnormal blood chemistry   . Effects of radiation, unspecified   . Anemia in neoplastic disease   . Muscle weakness (generalized)   . Dizziness and giddiness   . Spasm of muscle   . Unspecified vitamin D deficiency   . Hyperpotassemia   . Acute posthemorrhagic anemia   . Unspecified hearing loss   . Disorder of bone and cartilage, unspecified   . Screening for thyroid disorder   . Malignant neoplasm of bronchus and lung, unspecified site   . Hypopotassemia   . Atherosclerosis of native arteries of the extremities, unspecified   . Reflux esophagitis   . Unspecified constipation   . Type II or unspecified type diabetes mellitus with neurological manifestations, uncontrolled   . Unspecified hereditary and idiopathic peripheral neuropathy   . Urinary tract infection, site not specified   . Muscle weakness (generalized)   . Acute sinusitis, unspecified   .  Herpes zoster without mention of complication   . 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 Type II or unspecified type diabetes mellitus without mention of complication, not stated as uncontrolled   . Other and unspecified hyperlipidemia   . Anxiety state, unspecified   . Depressive disorder, not elsewhere classified   . Unspecified essential hypertension   . Atrial fibrillation   . Chronic airway obstruction, not elsewhere classified   . Osteoarthrosis, unspecified whether generalized or localized, unspecified site   . Lumbago     Past Surgical History  Procedure Laterality Date  . Lung lobectomy  1986    left  . Septoplasty    . Total hip arthroplasty  01/2010    left  . Bronchoscopy  02/2011  . Dilation and curettage of uterus  1980's  . Tubal ligation  1980's  . Cardiac catheterization    . Fetal blood transfusion  09/16/11  . Joint replacement  04/13/10    Left Hip  . Cataract extraction  03/12/2013    Left Eye     History  Smoking status  . Former Smoker -- 1.50 packs/day for 40 years  . Types: Cigarettes  . Quit date: 02/10/1997  Smokeless tobacco  . Never Used    History  Alcohol Use  . Yes    Comment: in the past -occassionaly "maybe on weekends"    Family History  Problem  Relation Age of Onset  . Bone cancer Father   . Cancer Father   . Diabetes Mother   . Deep vein thrombosis Mother   . Heart disease Mother     Heart Disease before age 74  . Hyperlipidemia Mother   . Hypertension Mother   . Arthritis Daughter     Review of Systems: The review of systems is per the HPI.  All other systems were reviewed and are negative.  Physical Exam: BP 130/60  Pulse 88  Ht 5\' 6"  (1.676 m)  Wt 185 lb 12.8 oz (84.278 kg)  BMI 30.00 kg/m2  SpO2 98% Patient is very pleasant and in no acute distress. Weight is down 5 pounds. Skin is warm and dry. Color is normal.  HEENT is unremarkable. Normocephalic/atraumatic. PERRL. Sclera are nonicteric. Neck is supple. No  masses. No JVD. Lungs are coarse. Cardiac exam shows a regular rate and rhythm. Abdomen is soft. Extremities are with just trace edema. Gait and ROM are intact. Using a cane. Has oxygen in place. No gross neurologic deficits noted.  Wt Readings from Last 3 Encounters:  10/23/13 185 lb 12.8 oz (84.278 kg)  10/10/13 190 lb (86.183 kg)  08/31/13 191 lb (86.637 kg)    LABORATORY DATA/PROCEDURES:  Lab Results  Component Value Date   WBC 10.8* 07/24/2013   HGB 11.3* 07/24/2013   HCT 34.2* 07/24/2013   PLT 384 07/24/2013   GLUCOSE 187* 07/24/2013   CHOL 176 09/16/2011   TRIG 92 09/16/2011   HDL 68 09/16/2011   LDLCALC 90 09/16/2011   ALT 16 07/24/2013   AST 17 07/24/2013   NA 141 07/24/2013   K 4.2 07/24/2013   CL 97 06/27/2013   CREATININE 1.3* 07/24/2013   BUN 19.9 07/24/2013   CO2 23 07/24/2013   TSH 0.86 08/07/2009   INR 0.94 05/23/2012   HGBA1C 5.1 09/15/2011    BNP (last 3 results) No results found for this basename: PROBNP,  in the last 8760 hours   Assessment / Plan: 1. CAD - nonobstructive per cath in 2006 - managed medically. Check EKG today - this is unchanged - has RSR'  2. HTN - BP is good - no change in current regimen  3. Atypical chest pain - EKG ok - would favor medical management. Sounds more like it is from her radiation treatment that she has received.  4. Worsening shortness of breath - most likely multifactorial - has severe COPD, prior lung cancer x 2, surgery, radiation, sedentary, etc. Will check echo to make sure EF is still ok.   Tentatively see back in 6 months unless the echo is abnormal.   Patient is agreeable to this plan and will call if any problems develop in the interim.   Burtis Junes, RN, [facility] [address] [place], [place]  [address] [phone]

## 2013-10-23 NOTE — Progress Notes (Signed)
Subjective:    Patient ID: [name], female    DOB: 06/01/1945, 68 y.o.   MRN: [id number] HPI History of Present Illness:  LENNIX ROTUNDO is a 68 y.o. 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 Has received XRT and chemo with Drs Pablo Ledger and Julien Nordmann.  She tolerated XRT completely. She has completed 3 cycles chemo.  Currently managed on combivent qid, started O2.   ROV 11/26/11 -- COPD with adenoCA 12/12 (chemo/XRT).  Presents today reporting R back and shoulder pain - is musculoskeletal, is involving R chest and R shoulder as well. She was exerting herself 3 weeks ago, believes she was sore afterwards and now with muscle pain. Dr Guinevere Ferrari gave her flexeril. [facility] Dr Hardin Negus. She is having more cough, which is exacerbating her back and shoulder pain.   ROV 03/03/12 -- COPD with adenoCA 12/12 (chemo/XRT). Currently on combivent, taking it prn. 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 Stable CT scan on 01/17/12. She is having more SOB. Was started on avelox recently for sinusitis, no other exacerbations. Marginal compliance with her O2  ROV 05/16/12 -- COPD with adenoCA 12/12. She developed URI sx, nasal obstruction, clear to yellow nasal congestion, scratchy throat. She tried restarting restarting claritin and mucinex-D a couple weeks ago. She has been using both prn - little improvement. She has a throat trickle, cough that is sometimes productive - white, yellow, clear. Using combivent twice a day for last two weeks > helps her cough.   Acute OV 12/27/12 -- COPD with adenoCA 12/12 (chemo/XRT). For last 2 weeks has had nasal drainage, low energy, cough usually non-prod, more indigestion. Was treated w Augmentin. She started mucinex DM x 2 weeks. She stopped loratadine 3 weeks ago because pressure in her head. Last CT scan was in 07/2012. Due to see Dr Julien Nordmann next month.   ROV 05/01/13 -- COPD with adenoCA  12/12 (chemo/XRT). Her last CT scan was done 01/22/13, read as stable from CA standpoint. She is still dealing with cough, dyspnea and a smothering feeling. She wears O2 prn, with most exertion, at 2.5L/min. She is using combivent about 4x a day.   ROV 08/31/13 -- COPD with adenoCA 12/12 (chemo/XRT). CT scan 08/23/13 was stable. O2 dependent w exertion.  She has baseline exertional SOB. starting about 6 weeks ago began to have nasal drainage, green mucous. She changed from loratadine to zyrtec-D. Uses nasal saline spray. Has never tolerated other nasal sprays due to dryness. Uses combivent qid.   ROV 10/23/13 -- COPD with RUL adenoCA 12/'12 (chemo/XRT). Last Ct chest was 07/24/13. She reports that she has been experiencing am B am numbness, some R pectoral and shoulder pain. Of note she has been using and rolling her wheelchair. Her breathing is very limiting. She has nasal congestion and was recently started on mucinex D. Hasn't been taking loratadine. She is using clombivent ~4 times a day.      Objective:   Physical Exam Filed Vitals:   10/23/13 1332  BP: 140/70  Pulse: 94  Height: 5\' 5"  (1.651 m)  Weight: 85.186 kg (187 lb 12.8 oz)  SpO2: 98%   Gen: Pleasant, elderly woman, in no distress,  normal affect  ENT: No lesions,  mouth clear,  oropharynx clear, no postnasal drip  Neck: No JVD, no TMG, no carotid bruits  Lungs: No use of accessory muscles, distant, no wheezes or  crackles  Cardiovascular: RRR, heart sounds normal, no murmur or gallops, no peripheral edema  Musculoskeletal: No deformities, no cyanosis or clubbing. Tender to palp of the R pectoral area  Neuro: alert, non focal  Skin: Warm, no lesions or rashes   07/24/13 --  COMPARISON: 01/22/2013  FINDINGS:  There is no pleural effusion identified. Centrilobular emphysema.  Consolidation, radiation fibrosis and volume loss are seen in the  right perihilar region with involvement of primarily the right upper  lobe. This  is unchanged in appearance from the previous exam.  Ground-glass attenuation in the right middle lobe measures 5.7 cm,  image 25/series 7 and appears unchanged from the previous exam.  Anterior right middle lobe nodule is unchanged at 6 mm, image  29/series 7. There are postoperative changes from left upper  lobectomy. Nonspecific area of ground-glass attenuation in the left  lower lobe appears stable from previous exam, image 26/series 7.  The heart size appears normal. There is no pericardial effusion.  Calcified atherosclerotic disease involves the thoracic aorta. There  are calcifications involving the LAD Coronary artery.  Incidental imaging through the upper abdomen shows mild diffuse low  attenuation within the liver parenchyma. There is a nodule in the  right adrenal gland which measures 1.5 cm, image 57/series 2. This  is unchanged from previous exam. Left adrenal nodule is stable  measuring 1.3 cm, image 57/series 2.  Review of the visualized bony structures shows stable compression  deformities involving T4 and T5. The T4 vertebra has been treated  with bone cement. Next  IMPRESSION:  1. Stable postoperative and post treatment changes in the lungs  bilaterally.  2. No change in patchy area of ground-glass attenuation in the right  hemi thorax.  3. Resolution of small right pleural effusion.  4. Hepatic steatosis.  5. Stable bilateral adrenal nodules.  6. No change in T4 and T5 compression fractures      Assessment & Plan:  COPD - trial changing combivent to anoro, rov to assess effects  SINUSITIS, CHRONIC NOS Add nasal steroid to mucinex D and loratadine  Atypical chest pain Appears to be MSK likely related to her increased exertion as she rolls her wheelchair. Will manage conservatively

## 2013-10-23 NOTE — Assessment & Plan Note (Signed)
Appears to be MSK likely related to her increased exertion as she rolls her wheelchair. Will manage conservatively

## 2013-10-24 ENCOUNTER — Other Ambulatory Visit: Payer: Self-pay | Admitting: *Deleted

## 2013-10-24 ENCOUNTER — Encounter: Payer: Self-pay | Admitting: Family

## 2013-10-24 ENCOUNTER — Telehealth: Payer: Self-pay | Admitting: Emergency Medicine

## 2013-10-24 MED ORDER — FLUTICASONE PROPIONATE 50 MCG/ACT NA SUSP: 2.0000 | Freq: Two times a day (BID) | NASAL | Status: DC

## 2013-10-24 NOTE — Telephone Encounter (Signed)
Called and spoke to pt. Pt stated she was reading about the Anoro and stated she read she should take the Anoro with caution d/t her DM. Pt takes metformin 500mg  BID, last time pt took metformin and checked her blood sugar was on 7/13, pt unsure of BG levels. Pt states since she started taking the Anoro she has had an increase in energy and is breathing with more ease. Pt states the Anoro has really helped. RB please advise if there are any contraindications to pt taking Anoro.

## 2013-10-24 NOTE — Telephone Encounter (Signed)
There have been some cases reported of Anoro raising people's blood sugar, but this is rare. The benefits of staying on the medication far outweigh any problems that could happen with her diabetes. I don't have any hesitation in recommending that she stay on the Anoro. If she notices worsening sugar control then we can talk about whether she should change.

## 2013-10-24 NOTE — Telephone Encounter (Signed)
Spoke with pt and advised of RB's rec's. Pt verbalized understanding.' Nothing further needed

## 2013-10-25 ENCOUNTER — Encounter: Payer: Self-pay | Admitting: Family

## 2013-10-25 ENCOUNTER — Ambulatory Visit (INDEPENDENT_AMBULATORY_CARE_PROVIDER_SITE_OTHER): Payer: Medicare Other | Admitting: Family

## 2013-10-25 ENCOUNTER — Ambulatory Visit (HOSPITAL_COMMUNITY)
Admission: RE | Admit: 2013-10-25 | Discharge: 2013-10-25 | Disposition: A | Discharge status: Home / Self Care | Payer: Medicare Other | Source: Ambulatory Visit | Attending: Family | Admitting: Family

## 2013-10-25 VITALS — BP 123/69 | HR 86 | Resp 16 | Ht 66.0 in | Wt 182.0 lb

## 2013-10-25 DIAGNOSIS — I70219 Atherosclerosis of native arteries of extremities with intermittent claudication, unspecified extremity: Secondary | ICD-10-CM | POA: Diagnosis not present

## 2013-10-25 NOTE — Patient Instructions (Signed)

## 2013-10-25 NOTE — Telephone Encounter (Signed)
Close encounter 

## 2013-10-25 NOTE — Progress Notes (Signed)
[facility] [place] HISTORY AND PHYSICAL -PAD  History of Present Illness [name] is a 68 y.o. female patient of Dr. Oneida Alar who returns for followup today regarding peripheral arterial disease. She has been followed since 2011. She was originally seen and found to have mild to moderate bilateral peripheral arterial disease. She has a known left common femoral stenosis and a right common iliac stenosis. This was seen on prior CT Angio. She complains of numbness and tingling from the knee down to the foot bilaterally at times. She does not really describe claudication symptoms. However she is overall fairly inactive consistent recliner most of the day. She is on continuous home oxygen. She has now developed recurrent lung cancer and has recently had radiation and chemotherapy treatments. She also complains of some soreness behind both knees in the right groin occasionally. She is also noted that she bruises easy. She denies any lower extremity swelling. She had a left total hip replacement several years ago. She does not describe rest pain in the feet. Chronic medical problems include hypertension, hyperlipidemia, coronary artery disease, lung cancer, diabetes, atrial fibrillation, COPD. These are all currently stable.  Her walking is limited by dyspnea, not claudication symptoms, denies non healing wounds. She elevates her ankles above her heart when in her recliner and does leg exercises daily while sitting.  The patient reports New Medical or Surgical History: has gallstones, her stomach ulcer is causing pain and post prandial nausea, diarrhea for a week, denies rectal bleeding  Pt Diabetic: Yes, states in good control Pt smoker: former smoker, quit in 2000  Pt meds include: Statin :No ASA: Yes Other anticoagulants/antiplatelets: no  Past Medical History  Diagnosis Date  . 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 Restless legs syndrome (RLS)   . Chronic sinusitis    . Hypertension   . Hyperlipidemia   . Anemia, unspecified   . GERD (gastroesophageal reflux disease)   . Respiratory failure with hypoxia 01/02/2010  . Complication of anesthesia     slow to wake up  . Asthma   . Coronary artery disease     Dr. Burt Knack had stress test done  . Type II or unspecified type diabetes mellitus without mention of complication, not stated as uncontrolled   . Peripheral vascular disease   . PAD (peripheral artery disease)   . Heart murmur   . Dysrhythmia     irregular  . Anginal pain   . History of blood clots     "2 in my aorta"  . Hard of hearing, right   . History of bronchitis   . Bronchial pneumonia     history  . COPD (chronic obstructive pulmonary disease)   . Emphysema   . Shortness of breath     "anytime really"  . Syncope and collapse   . Adenocarcinoma, lung     upper left lobe  . 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 Chemotherapy adverse reaction 09/15/11    "makes me light headed and pass out; this is 3rd blood transfusion since going thru it"  . History of blood transfusion   . Kidney infection     "related to chemo"  . Sinus headache     "all the time"  . Arthritis     "all over my body"  . Fibromyalgia   . Osteoporosis   . DDD (degenerative disc disease), lumbar   . Scoliosis   . Anxiety   .  Mental disorder   . Reflux   . CHF (congestive heart failure)   . DVT (deep venous thrombosis)   . Hx of radiation therapy 04/28/11 -06/08/11    right upper lobe-lung  . Acute upper respiratory infections of unspecified site   . Pain in thoracic spine   . Asymptomatic varicose veins   . Functional urinary incontinence   . Diseases of lips   . Other abnormal blood chemistry   . Effects of radiation, unspecified   . Anemia in neoplastic disease   . Muscle weakness (generalized)   . Dizziness and giddiness   . Spasm of muscle   . Unspecified vitamin D deficiency   . Hyperpotassemia   . Acute  posthemorrhagic anemia   . Unspecified hearing loss   . Disorder of bone and cartilage, unspecified   . Screening for thyroid disorder   . Malignant neoplasm of bronchus and lung, unspecified site   . Hypopotassemia   . Atherosclerosis of native arteries of the extremities, unspecified   . Reflux esophagitis   . Unspecified constipation   . Type II or unspecified type diabetes mellitus with neurological manifestations, uncontrolled   . Unspecified hereditary and idiopathic peripheral neuropathy   . Urinary tract infection, site not specified   . Muscle weakness (generalized)   . Acute sinusitis, unspecified   . Herpes zoster without mention of complication   . 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 Type II or unspecified type diabetes mellitus without mention of complication, not stated as uncontrolled   . Other and unspecified hyperlipidemia   . Anxiety state, unspecified   . Depressive disorder, not elsewhere classified   . Unspecified essential hypertension   . Atrial fibrillation   . Chronic airway obstruction, not elsewhere classified   . Osteoarthrosis, unspecified whether generalized or localized, unspecified site   . Lumbago     Social History History  Substance Use Topics  . Smoking status: Former Smoker -- 1.50 packs/day for 40 years    Types: Cigarettes    Quit date: 02/10/1997  . Smokeless tobacco: Never Used  . Alcohol Use: Yes     Comment: in the past -occassionaly "maybe on weekends"    Family History Family History  Problem Relation Age of Onset  . Bone cancer Father   . Cancer Father   . Diabetes Mother   . Deep vein thrombosis Mother   . Heart disease Mother     Heart Disease before age 44  . Hyperlipidemia Mother   . Hypertension Mother   . Arthritis Daughter     Past Surgical History  Procedure Laterality Date  . Lung lobectomy  1986    left  . Septoplasty    . Total hip arthroplasty  01/2010    left  . Bronchoscopy  02/2011  .  Dilation and curettage of uterus  1980's  . Tubal ligation  1980's  . Cardiac catheterization    . Fetal blood transfusion  09/16/11  . Joint replacement  04/13/10    Left Hip  . Cataract extraction  03/12/2013    Left Eye     Allergies  Allergen Reactions  . Iohexol Shortness Of Breath and Other (See Comments)    "burns me up inside"    Pt does fine with 13 hour prep  01/17/12  . Other     CAN'T STAND THE SMELL OF PURE BLEACH; "HAVE TO BACK UP AND POUR AND DON'T BREATH IT TIL i GET CAP  BACK ON; DILUTE IT TO SPRAY"  . Codeine Itching and Nausea And Vomiting    REACTION: GI upset  . Protonix [Pantoprazole Sodium] Diarrhea    Current Outpatient Prescriptions  Medication Sig Dispense Refill  . albuterol-ipratropium (COMBIVENT) 18-103 MCG/ACT inhaler every 4 (four) hours as needed. 2 puffs every for hours for lung cancer      . ALPRAZolam (XANAX) 1 MG tablet TAKE 1 TABLET BY MOUTH THREE TIMES A DAY AS NEEDED ANXIETY  60 tablet  2  . AMBULATORY NON FORMULARY MEDICATION Lighter Weight Rollator with Padded Seat and Hand Brakes Dx: 496, 729.1, 722.52  1 each  0  . aspirin EC 325 MG tablet Take 325 mg by mouth daily.      . Calcium Citrate-Vitamin D (CITRACAL + D PO) Take 2 tablets by mouth daily.      . citalopram (CELEXA) 20 MG tablet One daily to help mood.  90 tablet  3  . dextromethorphan-guaiFENesin (MUCINEX DM) 30-600 MG per 12 hr tablet One every 12 hours to help congestion and to suppress cough  60 tablet  3  . diltiazem (CARDIZEM CD) 240 MG 24 hr capsule TAKE 1 CAPSULE BY MOUTH EVERY DAY FOR BLOOD PRESSURE  90 capsule  1  . fluticasone (FLONASE) 50 MCG/ACT nasal spray Place 2 sprays into both nostrils 2 (two) times daily.  16 g  2  . glucose blood test strip Check blood sugar once daily  DX: 250.00      . HYDROcodone-acetaminophen (NORCO) 7.5-325 MG per tablet Take 1 tablet by mouth every 6 hours as needed for severe pain  120 tablet  0  . lidocaine (LIDODERM) 5 % Place 1 patch onto the  skin daily as needed. For shoulder pain.Remove & Discard patch within 12 hours or as directed by MD.  30 patch  0  . loratadine (CLARITIN) 10 MG tablet Take 1 tablet (10 mg total) by mouth daily.  30 tablet  11  . losartan (COZAAR) 50 MG tablet Take one tablet by mouth twice daily  60 tablet  5  . metFORMIN (GLUCOPHAGE) 500 MG tablet TAKE 1 TABLET BY MOUTH 2 TIMES A DAY  60 tablet  3  . metoCLOPramide (REGLAN) 5 MG tablet TAKE 1 TABLET BY MOUTH 4 TIMES A DAY  120 tablet  1  . Multiple Vitamins-Minerals (CENTRUM SILVER PO) Take 1 tablet by mouth daily.      . NON FORMULARY Place 3 L into the nose daily. 3 liter at home 2 liters when pt is out      . potassium chloride (K-DUR,KLOR-CON) 10 MEQ tablet TAKE 1 TABLET BY MOUTH EVERY DAY  30 tablet  5  . ranitidine (ZANTAC) 300 MG tablet TAKE 1 TABLET BY MOUTH EVERY NIGHT AT BEDTIME  30 tablet  3  . senna (SENOKOT) 8.6 MG TABS tablet Take 1 tablet (8.6 mg total) by mouth at bedtime and may repeat dose one time if needed. For constipation.  120 each  6  . Simethicone (GAS-X EXTRA STRENGTH PO) Take 1 tablet by mouth at bedtime as needed. As needed for gas.      . traMADol (ULTRAM) 50 MG tablet TAKE 1 TABLET BY MOUTH THREE TIMES A DAY AS NEEDED FOR PAIN  90 tablet  0  . triamcinolone ointment (KENALOG) 0.1 % RX was sent on 01-10-2013 with 5 refills  60 g  5  . triamterene-hydrochlorothiazide (MAXZIDE-25) 37.5-25 MG per tablet TAKE 1 TABLET BY MOUTH EVERY DAY TO  CONTROL BLOOD PRESSURE  30 tablet  3  . Umeclidinium-Vilanterol (ANORO ELLIPTA) 62.5-25 MCG/INH AEPB Inhale into the lungs daily.      . COMBIVENT RESPIMAT 20-100 MCG/ACT AERS respimat INHALE 1 PUFF BY MOUTH EVERY 6 HOURS AS NEEDED FOR SHORTNESS OF BREATH  12 g  5  . HEMATINIC/FOLIC ACID 008-6 MG TABS TAKE 1 TABLET BY MOUTH DAILY  30 each  4  . [DISCONTINUED] calcium carbonate 200 MG capsule Take 250 mg by mouth daily. Does not take everyday       No current facility-administered medications for this  visit.   Facility-Administered Medications Ordered in Other Visits  Medication Dose Route Frequency Provider Last Rate Last Dose  . hyaluronate sodium (RADIAPLEXRX) gel   Topical Once Thea Silversmith, MD      . topical emolient (BIAFINE) emulsion   Topical PRN Thea Silversmith, MD        ROS: See HPI for pertinent positives and negatives.   Physical Examination   Filed Vitals:   10/25/13 1628  BP: 123/69  Pulse: 86  Resp: 16  Height: 5\' 6"  (1.676 m)  Weight: 182 lb (82.555 kg)  SpO2: 99%   Body mass index is 29.39 kg/(m^2).   General: A&O x 3, WDWN. Gait: using cane Eyes: pupils equal Pulmonary: CTAB, without wheezes , rales or rhonchi. Cardiac: regular Rythm , without detected murmur.         Carotid Bruits Left Right   Negative Negative  Aorta is not palpable. Radial pulses: are 2+ palpable and =                           VASCULAR EXAM: Extremities without ischemic changes  without Gangrene; without open wounds.                                                                                                          LE Pulses LEFT RIGHT       FEMORAL  not palpable  not palpable        POPLITEAL  not palpable   not palpable       POSTERIOR TIBIAL  not palpable   not palpable        DORSALIS PEDIS      ANTERIOR TIBIAL faintly palpable  faintly palpable    Abdomen: soft, NT, no masses. Skin: no rashes, no ulcers noted. Musculoskeletal: no muscle wasting or atrophy. 2+ non pitting edema in lower legs and ankles.  Neurologic: A&O X 3; Appropriate Affect ; SENSATION: normal; MOTOR FUNCTION:  moving all extremities equally, motor strength 3/5 throughout. Speech is fluent/normal. CN 2-12 grossly intact.    Non-Invasive Vascular Imaging: DATE: 10/25/2013 ABI: RIGHT 0.96;  LEFT 0.93, Waveforms: tri and biphasic Previous (10/26/12) ABI's: Right: 0.92, Left: 0.94  ASSESSMENT: VENOLA CASTELLO is a 68 y.o. female who was originally seen and found to have mild to  moderate bilateral peripheral arterial disease. Her walking is limited by dyspnea, not claudication symptoms, denies non healing wounds. She elevates her  ankles above her heart when in her recliner and does leg exercises daily while sitting. ABI's remain normal and stable from a year ago, no evidence of arterial occlusive disease.   PLAN:  I discussed in depth with the patient the nature of atherosclerosis, and emphasized the importance of maximal medical management including strict control of blood pressure, blood glucose, and lipid levels, obtaining regular exercise, and continued cessation of smoking.  The patient is aware that without maximal medical management the underlying atherosclerotic disease process will progress, limiting the benefit of any interventions.  Continue chair exercises for legs.   Based on the patient's vascular studies and examination, pt will return to clinic in 1 year for ABI's.  The patient was given information about PAD including signs, symptoms, treatment, what symptoms should prompt the patient to seek immediate medical care, and risk reduction measures to take.  Clemon Chambers, RN, MSN, FNP-C [facility] Phone: [phone]  Clinic MD: Scot Dock  10/25/2013 4:48 PM

## 2013-10-26 ENCOUNTER — Telehealth: Payer: Self-pay | Admitting: Emergency Medicine

## 2013-10-26 NOTE — Telephone Encounter (Signed)
Will forward to [facility] since [place] off this pm  Please advise thanks

## 2013-10-26 NOTE — Telephone Encounter (Signed)
Let pt know she needs to stop anoro.  She can go back to combivent until she hears from [name].  Please send note to [name] so he can decide a substitute.

## 2013-10-26 NOTE — Telephone Encounter (Signed)
ATC the pt x 2 and NA, no option to leave a msg, [facility]

## 2013-10-26 NOTE — Telephone Encounter (Signed)
Spoke with the pt  She states started taking anoro 2 days ago  She noticed last night after using inhaler her top lip was swollen  She states that she does not have any swelling anywhere else, and no rash or changes in her breathing  RB, please advise thanks!

## 2013-10-26 NOTE — Addendum Note (Signed)
Addended by: Mena Goes on: 10/26/2013 11:04 AM   Modules accepted: Orders

## 2013-10-29 NOTE — Telephone Encounter (Signed)
I spoke with the pt and advised of recs. She states she continued the anro over the weekend and the swelling has improved. She states she hates to stop the Anoro because it has helped her breathing so much. I advised her to go ahead and stop the inhaler until we get further recs from [name]. [name] please advise. [place] Bing, CMA

## 2013-10-31 NOTE — Telephone Encounter (Signed)
LMOM x 1 

## 2013-10-31 NOTE — Telephone Encounter (Signed)
Have her try the anoro again, but be sure to stop it and call us if she has ANY swelling or rash.

## 2013-11-01 ENCOUNTER — Ambulatory Visit (HOSPITAL_COMMUNITY): Payer: Medicare Other | Attending: Internal Medicine

## 2013-11-01 DIAGNOSIS — I739 Peripheral vascular disease, unspecified: Secondary | ICD-10-CM | POA: Diagnosis not present

## 2013-11-01 DIAGNOSIS — H9209 Otalgia, unspecified ear: Secondary | ICD-10-CM | POA: Insufficient documentation

## 2013-11-01 DIAGNOSIS — Z87891 Personal history of nicotine dependence: Secondary | ICD-10-CM | POA: Diagnosis not present

## 2013-11-01 DIAGNOSIS — I499 Cardiac arrhythmia, unspecified: Secondary | ICD-10-CM | POA: Diagnosis not present

## 2013-11-01 DIAGNOSIS — I2510 Atherosclerotic heart disease of native coronary artery without angina pectoris: Secondary | ICD-10-CM | POA: Insufficient documentation

## 2013-11-01 DIAGNOSIS — R0609 Other forms of dyspnea: Secondary | ICD-10-CM | POA: Insufficient documentation

## 2013-11-01 DIAGNOSIS — I509 Heart failure, unspecified: Secondary | ICD-10-CM | POA: Diagnosis not present

## 2013-11-01 DIAGNOSIS — IMO0002 Reserved for concepts with insufficient information to code with codable children: Secondary | ICD-10-CM | POA: Insufficient documentation

## 2013-11-01 DIAGNOSIS — K279 Peptic ulcer, site unspecified, unspecified as acute or chronic, without hemorrhage or perforation: Secondary | ICD-10-CM | POA: Insufficient documentation

## 2013-11-01 DIAGNOSIS — I10 Essential (primary) hypertension: Secondary | ICD-10-CM

## 2013-11-01 DIAGNOSIS — E785 Hyperlipidemia, unspecified: Secondary | ICD-10-CM | POA: Diagnosis not present

## 2013-11-01 DIAGNOSIS — R55 Syncope and collapse: Secondary | ICD-10-CM | POA: Diagnosis not present

## 2013-11-01 DIAGNOSIS — J4489 Other specified chronic obstructive pulmonary disease: Secondary | ICD-10-CM | POA: Insufficient documentation

## 2013-11-01 DIAGNOSIS — R0989 Other specified symptoms and signs involving the circulatory and respiratory systems: Secondary | ICD-10-CM | POA: Insufficient documentation

## 2013-11-01 DIAGNOSIS — I359 Nonrheumatic aortic valve disorder, unspecified: Secondary | ICD-10-CM | POA: Insufficient documentation

## 2013-11-01 DIAGNOSIS — K219 Gastro-esophageal reflux disease without esophagitis: Secondary | ICD-10-CM | POA: Insufficient documentation

## 2013-11-01 DIAGNOSIS — R079 Chest pain, unspecified: Secondary | ICD-10-CM | POA: Diagnosis not present

## 2013-11-01 DIAGNOSIS — E119 Type 2 diabetes mellitus without complications: Secondary | ICD-10-CM | POA: Insufficient documentation

## 2013-11-01 DIAGNOSIS — R197 Diarrhea, unspecified: Secondary | ICD-10-CM | POA: Insufficient documentation

## 2013-11-01 DIAGNOSIS — R072 Precordial pain: Secondary | ICD-10-CM | POA: Diagnosis not present

## 2013-11-01 DIAGNOSIS — M4120 Other idiopathic scoliosis, site unspecified: Secondary | ICD-10-CM | POA: Insufficient documentation

## 2013-11-01 DIAGNOSIS — D696 Thrombocytopenia, unspecified: Secondary | ICD-10-CM | POA: Insufficient documentation

## 2013-11-01 DIAGNOSIS — R011 Cardiac murmur, unspecified: Secondary | ICD-10-CM | POA: Insufficient documentation

## 2013-11-01 DIAGNOSIS — J449 Chronic obstructive pulmonary disease, unspecified: Secondary | ICD-10-CM | POA: Insufficient documentation

## 2013-11-01 NOTE — Telephone Encounter (Signed)
I spoke with the [name] and she states when she last spoke with [place] on 7-20 she started back on the anoro and took it on 7-20, 2-21, 7-22 and her lips began to swell again. She has not taken anoro today. I advised to stop the anoro and only do the combivent as advised from [facility] and we will see what alternatives Dr. Mosie Epstein. The [name] denies any sob, swelling of the tongue, throat. [name] advised if she develops any of these symptoms to call 911 and go to ER. Please advise. [place] Bing, CMA

## 2013-11-01 NOTE — Progress Notes (Signed)
2D Echo completed. 11/01/2013

## 2013-11-02 NOTE — Telephone Encounter (Signed)
Attempted to x1 lmtcb

## 2013-11-02 NOTE — Telephone Encounter (Signed)
The best options for now > change back to combivent 4x a day (on a schedule) OR try Spiriva once a day. Please ask her which she would prefer to do. Thanks.

## 2013-11-02 NOTE — Telephone Encounter (Signed)
Spoke with pt and advised her of RB's rec's. Pt stated that combivent was not doing her any good and would like to try spiriva respimat until next ov 12/04/13 and re-assess at that time. Pt's home aid is to come by and pick up 2 samples of spiriva respimat and be shown how pt is to use. Nothing further needed

## 2013-11-02 NOTE — Telephone Encounter (Signed)
We will have to put Anoro on her allergy list. Please inform her that she should never take it again.

## 2013-11-05 ENCOUNTER — Telehealth: Payer: Self-pay | Admitting: Emergency Medicine

## 2013-11-05 ENCOUNTER — Telehealth: Payer: Self-pay | Admitting: Cardiovascular Disease

## 2013-11-05 MED ORDER — FLUCONAZOLE 100 MG PO TABS: 100.0000 mg | ORAL_TABLET | Freq: Every day | ORAL | Status: DC

## 2013-11-05 NOTE — Telephone Encounter (Signed)
Called and spoke with pt and she is aware of RB recs.  She is aware of the diflucan that has been called to [facility] on [address].  Pt will come by tomorrow to pick up her sample of the symbicort 160 and will have someone show her how to use this medication.

## 2013-11-05 NOTE — Telephone Encounter (Signed)
I don't believe she has been on the spiriva long enough for [place] to stop it - sometimes takes a month to see full benefit. I would like to add on Symbicort 160/4.20mcg 2 puffs bid. She may need some teaching on how to use it. Please ask her if she wants to come in and get samples + teaching.  Ok to start fluconazole 100mg  daily x 3 days.

## 2013-11-05 NOTE — Telephone Encounter (Signed)
Pt states that the Spiriva is not working and would like to switch back to [employer] that she benefited a lot from this inhaler and would like to start back on it Pt states that she is still having the mouth swelling and irritation--does not think that it was due to the Anoro Pt states that around the time of her mouth outbreak she had seen the Dentist for a tooth repair and was also on multiple medications for her stomach issues.  Pt wanting to know if she can be placed back on this inhaler and also is requesting something be called in for her mouth--pt feels she has a yeast infection/thrush. Allergies  Allergen Reactions  . Iohexol Shortness Of Breath and Other (See Comments)    "burns me up inside"    Pt does fine with 13 hour prep  01/17/12  . Other     CAN'T STAND THE SMELL OF PURE BLEACH; "HAVE TO BACK UP AND POUR AND DON'T BREATH IT TIL i GET CAP BACK ON; DILUTE IT TO SPRAY"  . Codeine Itching and Nausea And Vomiting    REACTION: GI upset  . Anoro Ellipta [Umeclidinium-Vilanterol]     Swelling of lips and mouth  . Protonix [Pantoprazole Sodium] Diarrhea    Please advise Dr Lamonte Sakai. Thanks.

## 2013-11-07 ENCOUNTER — Telehealth: Payer: Self-pay | Admitting: Emergency Medicine

## 2013-11-07 MED ORDER — BUDESONIDE-FORMOTEROL FUMARATE 160-4.5 MCG/ACT IN AERO: 2.0000 | INHALATION_SPRAY | Freq: Two times a day (BID) | RESPIRATORY_TRACT | Status: DC

## 2013-11-07 MED ORDER — FLUCONAZOLE 100 MG PO TABS: 100.0000 mg | ORAL_TABLET | Freq: Every day | ORAL | Status: DC

## 2013-11-07 NOTE — Telephone Encounter (Signed)
Spoke with patient-she states that she has not gotten her Rx's from 11-05-13 yet; looks as though a sample of Symbicort was left at front for patient and Diflucan was sent to [place]. Pt states she has not used Product/process development scientist in a while and wanted the Rx to go to [facility] and [address]. I have updated the patients pharmacy in her chart and resent Diflucan and Symbicort Rx(per patient-just wanted Rx no sample-pharmacy to instruct patient on usage). I have also cancelled Rx at [place]. Nothing further needed at this time.

## 2013-11-12 ENCOUNTER — Other Ambulatory Visit: Payer: Self-pay | Admitting: Internal Medicine

## 2013-11-12 ENCOUNTER — Telehealth: Payer: Self-pay | Admitting: Emergency Medicine

## 2013-11-12 NOTE — Telephone Encounter (Signed)
Called spoke with pt. She report she read on symbicort if you have asthma then using this inhaler can lead to death. Pt now afraid to use it.  Pt also does not feel that the spiriva resp has helped her. She reports last week she had another attack and could not breathe. She wants to go back on Anoro. Felt this has helped her the most.  Please advise RB thanks  --pending appt 12/04/13 --pt aware RB not in until tomorrow afternoon

## 2013-11-13 NOTE — Telephone Encounter (Signed)
The risk associated with Symbicort does not apply to her. Please reassure her that I think it is safe for now. We can revisit a change in inhalers at out scheduled office visit

## 2013-11-14 ENCOUNTER — Ambulatory Visit: Payer: Medicare Other | Admitting: Internal Medicine

## 2013-11-14 ENCOUNTER — Other Ambulatory Visit: Payer: Self-pay | Admitting: *Deleted

## 2013-11-14 DIAGNOSIS — M545 Low back pain, unspecified: Secondary | ICD-10-CM

## 2013-11-14 MED ORDER — HYDROCODONE-ACETAMINOPHEN 7.5-325 MG PO TABS: ORAL_TABLET | ORAL | Status: DC

## 2013-11-14 NOTE — Telephone Encounter (Signed)
Pt aware of recs per RB Nothing further needed.

## 2013-11-14 NOTE — Telephone Encounter (Signed)
lmomtcb x1 

## 2013-11-15 ENCOUNTER — Other Ambulatory Visit: Payer: Self-pay | Admitting: Internal Medicine

## 2013-11-29 ENCOUNTER — Telehealth: Payer: Self-pay | Admitting: Emergency Medicine

## 2013-11-29 NOTE — Telephone Encounter (Signed)
Based on our prior attempts and conversations I have believed that she has had facial and tongue swelling due to Anoro. Now it is difficult to find a cause since she still has the sx even off this medication. She has tried restarting the Anoro before but stopped due to swelling. I do not know whether to blame the Anoro or not - this is why I asked her not to use it. If she would like to try changing to Anoro one more time, I am Ok with this. But she will have to be advised that there is some risk involved in taking this medication and that she will have to report any side-effect symptoms to [place] immediately. Have her stop Spiriva and Symbicort, restart Anoro once a day. If she notices any change  In swelling then stop Anoro immediately.

## 2013-11-29 NOTE — Telephone Encounter (Signed)
Attempted to call pt but the pts VM has not been set up at this time.  Will try back later.

## 2013-11-29 NOTE — Telephone Encounter (Signed)
Called and spoke with pt and she stated that her chest has been feeling tight and her lips are still swollen and she stated it feels like her tongue is still swollen too.  She stated that she has been rinsing after using the symbicort and spiriva.  Pt stated that she has an appt on 8/25 with RB but she wanted to see if he would be willing to change her to the anoro until her OV to see if this helps her more.  RB please advise. Thanks  Allergies  Allergen Reactions  . Iohexol Shortness Of Breath and Other (See Comments)    "burns me up inside"    Pt does fine with 13 hour prep  01/17/12  . Other     CAN'T STAND THE SMELL OF PURE BLEACH; "HAVE TO BACK UP AND POUR AND DON'T BREATH IT TIL i GET CAP BACK ON; DILUTE IT TO SPRAY"  . Codeine Itching and Nausea And Vomiting    REACTION: GI upset  . Anoro Ellipta [Umeclidinium-Vilanterol]     Swelling of lips and mouth  . Protonix [Pantoprazole Sodium] Diarrhea    Current Outpatient Prescriptions on File Prior to Visit  Medication Sig Dispense Refill  . albuterol-ipratropium (COMBIVENT) 18-103 MCG/ACT inhaler every 4 (four) hours as needed. 2 puffs every for hours for lung cancer      . ALPRAZolam (XANAX) 1 MG tablet TAKE 1 TABLET BY MOUTH THREE TIMES A DAY AS NEEDED ANXIETY  90 tablet  0  . AMBULATORY NON FORMULARY MEDICATION Lighter Weight Rollator with Padded Seat and Hand Brakes Dx: 496, 729.1, 722.52  1 each  0  . aspirin EC 325 MG tablet Take 325 mg by mouth daily.      . budesonide-formoterol (SYMBICORT) 160-4.5 MCG/ACT inhaler Inhale 2 puffs into the lungs 2 (two) times daily. RINSE MOUTH WELL AFTER USE  1 Inhaler  1  . Calcium Citrate-Vitamin D (CITRACAL + D PO) Take 2 tablets by mouth daily.      . citalopram (CELEXA) 20 MG tablet One daily to help mood.  90 tablet  3  . COMBIVENT RESPIMAT 20-100 MCG/ACT AERS respimat INHALE 1 PUFF BY MOUTH EVERY 6 HOURS AS NEEDED FOR SHORTNESS OF BREATH  12 g  5  . dextromethorphan-guaiFENesin (MUCINEX DM)  30-600 MG per 12 hr tablet One every 12 hours to help congestion and to suppress cough  60 tablet  3  . diltiazem (CARDIZEM CD) 240 MG 24 hr capsule TAKE 1 CAPSULE BY MOUTH EVERY DAY FOR BLOOD PRESSURE  90 capsule  1  . fluconazole (DIFLUCAN) 100 MG tablet Take 1 tablet (100 mg total) by mouth daily.  3 tablet  0  . fluticasone (FLONASE) 50 MCG/ACT nasal spray Place 2 sprays into both nostrils 2 (two) times daily.  16 g  2  . glucose blood test strip Check blood sugar once daily  DX: 250.00      . HEMATINIC/FOLIC ACID 809-9 MG TABS TAKE 1 TABLET BY MOUTH DAILY  30 each  4  . HYDROcodone-acetaminophen (NORCO) 7.5-325 MG per tablet Take 1 tablet by mouth every 6 hours as needed for severe pain  120 tablet  0  . lidocaine (LIDODERM) 5 % Place 1 patch onto the skin daily as needed. For shoulder pain.Remove & Discard patch within 12 hours or as directed by MD.  30 patch  0  . loratadine (CLARITIN) 10 MG tablet Take 1 tablet (10 mg total) by mouth daily.  30 tablet  11  . losartan (COZAAR) 50 MG tablet Take one tablet by mouth twice daily  60 tablet  5  . metFORMIN (GLUCOPHAGE) 500 MG tablet TAKE 1 TABLET BY MOUTH 2 TIMES A DAY  60 tablet  3  . metoCLOPramide (REGLAN) 5 MG tablet TAKE 1 TABLET BY MOUTH 4 TIMES A DAY  120 tablet  1  . Multiple Vitamins-Minerals (CENTRUM SILVER PO) Take 1 tablet by mouth daily.      . NON FORMULARY Place 3 L into the nose daily. 3 liter at home 2 liters when pt is out      . potassium chloride (K-DUR,KLOR-CON) 10 MEQ tablet TAKE 1 TABLET BY MOUTH EVERY DAY  30 tablet  5  . ranitidine (ZANTAC) 300 MG tablet TAKE 1 TABLET BY MOUTH EVERY NIGHT AT BEDTIME  30 tablet  3  . senna (SENOKOT) 8.6 MG TABS tablet Take 1 tablet (8.6 mg total) by mouth at bedtime and may repeat dose one time if needed. For constipation.  120 each  6  . Simethicone (GAS-X EXTRA STRENGTH PO) Take 1 tablet by mouth at bedtime as needed. As needed for gas.      . traMADol (ULTRAM) 50 MG tablet TAKE 1  TABLET BY MOUTH THREE TIMES A DAY AS NEEDED FOR PAIN  90 tablet  0  . triamcinolone ointment (KENALOG) 0.1 % RX was sent on 01-10-2013 with 5 refills  60 g  5  . triamterene-hydrochlorothiazide (MAXZIDE-25) 37.5-25 MG per tablet TAKE 1 TABLET BY MOUTH EVERY DAY TO CONTROL BLOOD PRESSURE  30 tablet  3  . Umeclidinium-Vilanterol (ANORO ELLIPTA) 62.5-25 MCG/INH AEPB Inhale into the lungs daily.      . [DISCONTINUED] calcium carbonate 200 MG capsule Take 250 mg by mouth daily. Does not take everyday       Current Facility-Administered Medications on File Prior to Visit  Medication Dose Route Frequency Provider Last Rate Last Dose  . hyaluronate sodium (RADIAPLEXRX) gel   Topical Once Thea Silversmith, MD      . topical emolient (BIAFINE) emulsion   Topical PRN Thea Silversmith, MD

## 2013-11-30 NOTE — Telephone Encounter (Signed)
Pt aware of rec's per Dr Lamonte Sakai and also aware of risks of continuing Anoro without the knowledge of where her sx are coming from. Advised that if she sees a change in her sx's to contact our on call doc asap or go to ED--pt also aware to stop the Anoro immediately if any changes. Advised to stop Spiriva and Symbicort while taking the Anoro. Pt has appt 12/04/13 with RB Nothing further needed.

## 2013-12-04 ENCOUNTER — Encounter: Payer: Self-pay | Admitting: Internal Medicine

## 2013-12-04 ENCOUNTER — Ambulatory Visit (INDEPENDENT_AMBULATORY_CARE_PROVIDER_SITE_OTHER): Payer: Medicare Other | Admitting: Emergency Medicine

## 2013-12-04 ENCOUNTER — Ambulatory Visit (INDEPENDENT_AMBULATORY_CARE_PROVIDER_SITE_OTHER): Payer: Medicare Other | Admitting: Internal Medicine

## 2013-12-04 ENCOUNTER — Encounter: Payer: Self-pay | Admitting: Emergency Medicine

## 2013-12-04 VITALS — BP 136/60 | HR 86 | Temp 98.6°F | Resp 10 | Ht 65.5 in | Wt 185.0 lb

## 2013-12-04 VITALS — BP 112/62 | HR 93 | Temp 98.2°F | Ht 66.0 in | Wt 185.0 lb

## 2013-12-04 DIAGNOSIS — E1165 Type 2 diabetes mellitus with hyperglycemia: Secondary | ICD-10-CM

## 2013-12-04 DIAGNOSIS — J449 Chronic obstructive pulmonary disease, unspecified: Secondary | ICD-10-CM

## 2013-12-04 DIAGNOSIS — E785 Hyperlipidemia, unspecified: Secondary | ICD-10-CM

## 2013-12-04 DIAGNOSIS — N3000 Acute cystitis without hematuria: Secondary | ICD-10-CM | POA: Diagnosis not present

## 2013-12-04 DIAGNOSIS — R5381 Other malaise: Secondary | ICD-10-CM

## 2013-12-04 DIAGNOSIS — R531 Weakness: Secondary | ICD-10-CM

## 2013-12-04 DIAGNOSIS — R82998 Other abnormal findings in urine: Secondary | ICD-10-CM

## 2013-12-04 DIAGNOSIS — C3491 Malignant neoplasm of unspecified part of right bronchus or lung: Secondary | ICD-10-CM

## 2013-12-04 DIAGNOSIS — I209 Angina pectoris, unspecified: Secondary | ICD-10-CM | POA: Diagnosis not present

## 2013-12-04 DIAGNOSIS — I25119 Atherosclerotic heart disease of native coronary artery with unspecified angina pectoris: Secondary | ICD-10-CM

## 2013-12-04 DIAGNOSIS — M545 Low back pain, unspecified: Secondary | ICD-10-CM

## 2013-12-04 DIAGNOSIS — J4489 Other specified chronic obstructive pulmonary disease: Secondary | ICD-10-CM

## 2013-12-04 DIAGNOSIS — C3490 Malignant neoplasm of unspecified part of unspecified bronchus or lung: Secondary | ICD-10-CM

## 2013-12-04 DIAGNOSIS — R8290 Unspecified abnormal findings in urine: Secondary | ICD-10-CM

## 2013-12-04 DIAGNOSIS — I70219 Atherosclerosis of native arteries of extremities with intermittent claudication, unspecified extremity: Secondary | ICD-10-CM

## 2013-12-04 DIAGNOSIS — IMO0001 Reserved for inherently not codable concepts without codable children: Secondary | ICD-10-CM

## 2013-12-04 DIAGNOSIS — R300 Dysuria: Secondary | ICD-10-CM | POA: Diagnosis not present

## 2013-12-04 DIAGNOSIS — I2510 Atherosclerotic heart disease of native coronary artery without angina pectoris: Secondary | ICD-10-CM

## 2013-12-04 DIAGNOSIS — M797 Fibromyalgia: Secondary | ICD-10-CM

## 2013-12-04 DIAGNOSIS — R5383 Other fatigue: Secondary | ICD-10-CM

## 2013-12-04 LAB — POCT URINALYSIS DIPSTICK
Bilirubin, UA: NEGATIVE
Glucose, UA: NEGATIVE
Ketones, UA: NEGATIVE
Nitrite, UA: NEGATIVE
PH UA: 7.5
Protein, UA: NEGATIVE
Spec Grav, UA: 1.01
Urobilinogen, UA: 0.2

## 2013-12-04 MED ORDER — HYDROCODONE-ACETAMINOPHEN 7.5-325 MG PO TABS: ORAL_TABLET | ORAL | Status: DC

## 2013-12-04 MED ORDER — LEVOFLOXACIN 500 MG PO TABS: 500.0000 mg | ORAL_TABLET | Freq: Every day | ORAL | Status: DC

## 2013-12-04 MED ORDER — METFORMIN HCL 500 MG PO TABS: ORAL_TABLET | ORAL | Status: DC

## 2013-12-04 MED ORDER — CYCLOBENZAPRINE HCL 5 MG PO TABS: ORAL_TABLET | ORAL | Status: DC

## 2013-12-04 NOTE — Progress Notes (Signed)
Subjective:    Patient ID: [name], female    DOB: 10/10/1945, 68 y.o.   MRN: [id number] HPI History of Present Illness:  [name] is a 68 y.o. 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 Has received XRT and chemo with Drs Pablo Ledger and Julien Nordmann.  She tolerated XRT completely. She has completed 3 cycles chemo.  Currently managed on combivent qid, started O2.   ROV 11/26/11 -- COPD with adenoCA 12/12 (chemo/XRT).  Presents today reporting R back and shoulder pain - is musculoskeletal, is involving R chest and R shoulder as well. She was exerting herself 3 weeks ago, believes she was sore afterwards and now with muscle pain. Dr Guinevere Ferrari gave her flexeril. [facility], Dr Hardin Negus. She is having more cough, which is exacerbating her back and shoulder pain.   ROV 03/03/12 -- COPD with adenoCA 12/12 (chemo/XRT). Currently on combivent, taking it prn. 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 Stable CT scan on 01/17/12. She is having more SOB. Was started on avelox recently for sinusitis, no other exacerbations. Marginal compliance with her O2  ROV 05/16/12 -- COPD with adenoCA 12/12. She developed URI sx, nasal obstruction, clear to yellow nasal congestion, scratchy throat. She tried restarting restarting claritin and mucinex-D a couple weeks ago. She has been using both prn - little improvement. She has a throat trickle, cough that is sometimes productive - white, yellow, clear. Using combivent twice a day for last two weeks > helps her cough.   Acute OV 12/27/12 -- COPD with adenoCA 12/12 (chemo/XRT). For last 2 weeks has had nasal drainage, low energy, cough usually non-prod, more indigestion. Was treated w Augmentin. She started mucinex DM x 2 weeks. She stopped loratadine 3 weeks ago because pressure in her head. Last CT scan was in 07/2012. Due to see Dr Julien Nordmann next month.   ROV 05/01/13 -- COPD with adenoCA  12/12 (chemo/XRT). Her last CT scan was done 01/22/13, read as stable from CA standpoint. She is still dealing with cough, dyspnea and a smothering feeling. She wears O2 prn, with most exertion, at 2.5L/min. She is using combivent about 4x a day.   ROV 08/31/13 -- COPD with adenoCA 12/12 (chemo/XRT). CT scan 08/23/13 was stable. O2 dependent w exertion.  She has baseline exertional SOB. starting about 6 weeks ago began to have nasal drainage, green mucous. She changed from loratadine to zyrtec-D. Uses nasal saline spray. Has never tolerated other nasal sprays due to dryness. Uses combivent qid.   ROV 10/23/13 -- COPD with RUL adenoCA 12/'12 (chemo/XRT). Last Ct chest was 07/24/13. She reports that she has been experiencing am B am numbness, some R pectoral and shoulder pain. Of note she has been using and rolling her wheelchair. Her breathing is very limiting. She has nasal congestion and was recently started on mucinex D. Hasn't been taking loratadine. She is using clombivent ~4 times a day.   ROV 12/04/13 -- COPD with RUL adenoCA 12/'12 (chemo/XRT). Since last time she has had significant difficulty with her inhaled meds. I tried to change her combivent to Anoro, but she was unable to tolerate due to tongue and lip swelling. I changed her to spiriva and symbicort > she felt little response, sore throat, maybe tongue swelling. Tried Anoro again and the tongue swelling happened again. She feels that the Anoro helps her breathing the most, but she has reliably had  the side effects. She is back to combivent qid now.  She is having yellow nasal drainage, green sputum.      Objective:   Physical Exam Filed Vitals:   12/04/13 1356  BP: 112/62  Pulse: 93  Temp: 98.2 F (36.8 C)  TempSrc: Oral  Height: 5\' 6"  (1.676 m)  Weight: 185 lb (83.915 kg)  SpO2: 99%   Gen: Pleasant, elderly woman, in no distress,  normal affect  ENT: No lesions,  mouth clear,  oropharynx clear, no postnasal drip  Neck: No JVD,  no TMG, no carotid bruits  Lungs: No use of accessory muscles, distant, no wheezes or crackles  Cardiovascular: RRR, heart sounds normal, no murmur or gallops, no peripheral edema  Musculoskeletal: No deformities, no cyanosis or clubbing. Tender to palp of the R pectoral area  Neuro: alert, non focal  Skin: Warm, no lesions or rashes   07/24/13 --  COMPARISON: 01/22/2013  FINDINGS:  There is no pleural effusion identified. Centrilobular emphysema.  Consolidation, radiation fibrosis and volume loss are seen in the  right perihilar region with involvement of primarily the right upper  lobe. This is unchanged in appearance from the previous exam.  Ground-glass attenuation in the right middle lobe measures 5.7 cm,  image 25/series 7 and appears unchanged from the previous exam.  Anterior right middle lobe nodule is unchanged at 6 mm, image  29/series 7. There are postoperative changes from left upper  lobectomy. Nonspecific area of ground-glass attenuation in the left  lower lobe appears stable from previous exam, image 26/series 7.  The heart size appears normal. There is no pericardial effusion.  Calcified atherosclerotic disease involves the thoracic aorta. There  are calcifications involving the LAD Coronary artery.  Incidental imaging through the upper abdomen shows mild diffuse low  attenuation within the liver parenchyma. There is a nodule in the  right adrenal gland which measures 1.5 cm, image 57/series 2. This  is unchanged from previous exam. Left adrenal nodule is stable  measuring 1.3 cm, image 57/series 2.  Review of the visualized bony structures shows stable compression  deformities involving T4 and T5. The T4 vertebra has been treated  with bone cement. Next  IMPRESSION:  1. Stable postoperative and post treatment changes in the lungs  bilaterally.  2. No change in patchy area of ground-glass attenuation in the right  hemi thorax.  3. Resolution of small right  pleural effusion.  4. Hepatic steatosis.  5. Stable bilateral adrenal nodules.  6. No change in T4 and T5 compression fractures      Assessment & Plan:  COPD Difficult history to sort out, but she describes tongue swelling and lip swelling with Anoro, possibly with Symbicort/Spiriva as well. She believes that her bronchitis / sinusitis may be causing the swelling but I suspect meds.  - will treat with levaquin x 2 weeks for sinusitis and bronchitis - no pred at this time - combivent qid + prn  - rov 1

## 2013-12-04 NOTE — Progress Notes (Signed)
Patient ID: [name], female   DOB: 06/05/1945, 68 y.o.   MRN: [id number]    Location:    [facility]  Place of Service:  OFFICE    Allergies  Allergen Reactions  . Iohexol Shortness Of Breath and Other (See Comments)    "burns me up inside"    Pt does fine with 13 hour prep  01/17/12  . Other     CAN'T STAND THE SMELL OF PURE BLEACH; "HAVE TO BACK UP AND POUR AND DON'T BREATH IT TIL i GET CAP BACK ON; DILUTE IT TO SPRAY"  . Codeine Itching and Nausea And Vomiting    REACTION: GI upset  . Anoro Ellipta [Umeclidinium-Vilanterol]     Swelling of lips and mouth  . Protonix [Pantoprazole Sodium] Diarrhea    Chief Complaint  Patient presents with  . Medication Refill    Refill medications and Discuss Metformin. Patient questions if she can decrease, take ONLY as needed or d/c Metformin  . Dysuria    Patient with strong odor and bright yelow urine at times. Patient denies pain or discomfort when urinating     HPI:  Bad odor of urine - started a few days ago. Also she worries about a bright yellow color to her urine. She is taking a multivitamin.  Dysuria - recurrent issue. She thinks she may have an infection  COPD; continues to see [name]  Coronary artery disease involving native coronary artery of native heart with angina pectoris: having intermittent chest pain that she thinks in heart related  HYPERLIPIDEMIA - needs lipid panel  Non-small cell lung cancer, right;contines to see [name]. Has coughed up blood recently.  Type II or unspecified type diabetes mellitus without mention of complication, uncontrolled - has had several mornings her glucose has dropped to 60 mg%. She feels quite weak at that time.  Weakness generalized - multifactorial  Fibromyalgia - benefits from cyclobenzaprine (FLEXERIL) 5 MG tablet. It allows her to rest.  Midline low back pain without sciatica -benefits from HYDROcodone-APAP (NORCO) 7.5-325 MG per tablet    Medications: Patient's  Medications  New Prescriptions   No medications on file  Previous Medications   ALBUTEROL-IPRATROPIUM (COMBIVENT) 18-103 MCG/ACT INHALER    every 4 (four) hours as needed. 2 puffs every for hours for lung cancer   ALPRAZOLAM (XANAX) 1 MG TABLET    TAKE 1 TABLET BY MOUTH THREE TIMES A DAY AS NEEDED ANXIETY   AMBULATORY NON FORMULARY MEDICATION    Lighter Weight Rollator with Padded Seat and Hand Brakes Dx: 496, 729.1, 722.52   ASPIRIN EC 325 MG TABLET    Take 325 mg by mouth daily.   CALCIUM CITRATE-VITAMIN D (CITRACAL + D PO)    Take 2 tablets by mouth daily.   CITALOPRAM (CELEXA) 20 MG TABLET    One daily to help mood.   DEXTROMETHORPHAN-GUAIFENESIN (MUCINEX DM) 30-600 MG PER 12 HR TABLET    One every 12 hours to help congestion and to suppress cough   DILTIAZEM (CARDIZEM CD) 240 MG 24 HR CAPSULE    TAKE 1 CAPSULE BY MOUTH EVERY DAY FOR BLOOD PRESSURE   FLUTICASONE (FLONASE) 50 MCG/ACT NASAL SPRAY    Place 2 sprays into both nostrils 2 (two) times daily.   GLUCOSE BLOOD TEST STRIP    Check blood sugar once daily  DX: 250.00   HYDROCODONE-ACETAMINOPHEN (NORCO) 7.5-325 MG PER TABLET    Take 1 tablet by mouth every 6 hours as needed for severe pain  LEVOFLOXACIN (LEVAQUIN) 500 MG TABLET    Take 1 tablet (500 mg total) by mouth daily.   LIDOCAINE (LIDODERM) 5 %    Place 1 patch onto the skin daily as needed. For shoulder pain.Remove & Discard patch within 12 hours or as directed by MD.   LORATADINE (CLARITIN) 10 MG TABLET    Take 1 tablet (10 mg total) by mouth daily.   LOSARTAN (COZAAR) 50 MG TABLET    Take one tablet by mouth twice daily   METFORMIN (GLUCOPHAGE) 500 MG TABLET    TAKE 1 TABLET BY MOUTH 2 TIMES A DAY   METOCLOPRAMIDE (REGLAN) 5 MG TABLET    TAKE 1 TABLET BY MOUTH 4 TIMES A DAY   MULTIPLE VITAMINS-MINERALS (CENTRUM SILVER PO)    Take 1 tablet by mouth daily.   NON FORMULARY    Place 3 L into the nose daily. Oxygen   POTASSIUM CHLORIDE (K-DUR,KLOR-CON) 10 MEQ TABLET    TAKE 1  TABLET BY MOUTH EVERY DAY   RANITIDINE (ZANTAC) 300 MG TABLET    TAKE 1 TABLET BY MOUTH EVERY NIGHT AT BEDTIME   SENNA (SENOKOT) 8.6 MG TABS TABLET    Take 1 tablet (8.6 mg total) by mouth at bedtime and may repeat dose one time if needed. For constipation.   SIMETHICONE (GAS-X EXTRA STRENGTH PO)    Take 1 tablet by mouth at bedtime as needed. As needed for gas.   TRAMADOL (ULTRAM) 50 MG TABLET    TAKE 1 TABLET BY MOUTH THREE TIMES A DAY AS NEEDED FOR PAIN   TRIAMCINOLONE OINTMENT (KENALOG) 0.1 %    RX was sent on 01-10-2013 with 5 refills   TRIAMTERENE-HYDROCHLOROTHIAZIDE (MAXZIDE-25) 37.5-25 MG PER TABLET    TAKE 1 TABLET BY MOUTH EVERY DAY TO CONTROL BLOOD PRESSURE   UMECLIDINIUM-VILANTEROL (ANORO ELLIPTA) 62.5-25 MCG/INH AEPB    Inhale into the lungs daily.  Modified Medications   No medications on file  Discontinued Medications   BUDESONIDE-FORMOTEROL (SYMBICORT) 160-4.5 MCG/ACT INHALER    Inhale 2 puffs into the lungs 2 (two) times daily. RINSE MOUTH WELL AFTER USE   FLUCONAZOLE (DIFLUCAN) 100 MG TABLET    Take 1 tablet (100 mg total) by mouth daily.     Review of Systems  Constitutional: Positive for activity change. Negative for fever and chills.  HENT: Positive for ear pain (right side), sinus pressure, sore throat and trouble swallowing. Negative for congestion.   Eyes: Negative.   Respiratory: Positive for cough and shortness of breath. Negative for choking, chest tightness, wheezing and stridor.   Cardiovascular: Negative for chest pain and palpitations.  Gastrointestinal: Positive for constipation. Negative for nausea, abdominal pain, diarrhea and abdominal distention.       Reflux frequently  Musculoskeletal: Positive for arthralgias, back pain, gait problem, myalgias and neck pain.  Skin: Negative.   Neurological: Positive for dizziness, weakness (generalized) and light-headedness. Negative for tremors and syncope.  Hematological: Negative.   Psychiatric/Behavioral: Positive  for sleep disturbance and dysphoric mood. Negative for hallucinations. The patient is not nervous/anxious and is not hyperactive.     Filed Vitals:   12/04/13 1629  BP: 136/60  Pulse: 86  Temp: 98.6 F (37 C)  TempSrc: Oral  Resp: 10  Height: 5' 5.5" (1.664 m)  Weight: 185 lb (83.915 kg)  SpO2: 96%   Body mass index is 30.31 kg/(m^2).  Physical Exam  Constitutional: She is oriented to person, place, and time.  overweight  HENT:  Nose: Nose normal.  Mouth/Throat: Oropharynx is clear and moist. No oropharyngeal exudate.  Tympanic membranes have a pink blush on both sides.  Eyes: Conjunctivae and EOM are normal. Pupils are equal, round, and reactive to light.  Neck: No JVD present. No tracheal deviation present. No thyromegaly present.  Cardiovascular: Normal rate, regular rhythm and normal heart sounds.  Exam reveals no gallop and no friction rub.   No murmur heard. Pulmonary/Chest: No respiratory distress. She has no wheezes. She has no rales. She exhibits no tenderness.  Abdominal: She exhibits no distension and no mass. There is no tenderness.  Musculoskeletal: Normal range of motion. She exhibits edema and tenderness.  Lymphadenopathy:    She has no cervical adenopathy.  Neurological: She is oriented to person, place, and time. No cranial nerve deficit. Coordination normal.  Skin: No rash noted. No erythema. No pallor.  Psychiatric: Her behavior is normal. Judgment and thought content normal.     Labs reviewed: Office Visit on 12/04/2013  Component Date Value Ref Range Status  . Color, UA 12/04/2013 Dark Yellow   Final  . Clarity, UA 12/04/2013 Cloudy   Final  . Glucose, UA 12/04/2013 Neg   Final  . Bilirubin, UA 12/04/2013 Neg   Final  . Ketones, UA 12/04/2013 Neg   Final  . Spec Grav, UA 12/04/2013 1.010   Final  . Blood, UA 12/04/2013 Trace   Final  . pH, UA 12/04/2013 7.5   Final  . Protein, UA 12/04/2013 Neg   Final  . Urobilinogen, UA 12/04/2013 0.2   Final    . Nitrite, UA 12/04/2013 Neg   Final  . Leukocytes, UA 12/04/2013 small (1+)   Final    Assessment/Plan  1. Bad odor of urine - POCT urinalysis dipstick - Urine culture  2. Dysuria - POCT urinalysis dipstick - Urine culture  3. COPD Continue with [name]  4. Acute cystitis without hematuria -UA and culture  5. Coronary artery disease involving native coronary artery of native heart with angina pectoris unchanged  6. HYPERLIPIDEMIA - Lipid panel; Future  7. Non-small cell lung cancer, right Continue with [name]  8. Type II or unspecified type diabetes mellitus without mention of complication, uncontrolled Reduce metformin due to potential for hypoglycemia - metFORMIN (GLUCOPHAGE) 500 MG tablet; One each morning to control diabetes  Dispense: 60 tablet; Refill: 3 - Comprehensive metabolic panel; Future - Hemoglobin A1c; Future - Microalbumin, urine; Future  9. Weakness generalized - Comprehensive metabolic panel; Future  10. Fibromyalgia - cyclobenzaprine (FLEXERIL) 5 MG tablet; One at bed to relax muscles  Dispense: 30 tablet; Refill: 4  11. Midline low back pain without sciatica - HYDROcodone-acetaminophen (NORCO) 7.5-325 MG per tablet; Take 1 tablet by mouth every 6 hours as needed for severe pain  Dispense: 120 tablet; Refill: 0

## 2013-12-04 NOTE — Patient Instructions (Addendum)
Use your combivent 4x a day on a schedule Use albuterol 2 puffs as needed for shortness of breath DO NOT USE Symbicort, Anoro or Spiriva for now.  Take levaquin for 14 days as directed Follow with Dr Lamonte Sakai in 1 month

## 2013-12-04 NOTE — Assessment & Plan Note (Signed)
Difficult history to sort out, but she describes tongue swelling and lip swelling with Anoro, possibly with Symbicort/Spiriva as well. She believes that her bronchitis / sinusitis may be causing the swelling but I suspect meds.  - will treat with levaquin x 2 weeks for sinusitis and bronchitis - no pred at this time - combivent qid + prn  - rov 1

## 2013-12-05 ENCOUNTER — Telehealth: Payer: Self-pay | Admitting: *Deleted

## 2013-12-05 MED ORDER — BUDESONIDE-FORMOTEROL FUMARATE 160-4.5 MCG/ACT IN AERO: 2.0000 | INHALATION_SPRAY | Freq: Two times a day (BID) | RESPIRATORY_TRACT | Status: DC

## 2013-12-05 NOTE — Telephone Encounter (Signed)
Per Dr.Green- Symbicort 160/4.5 2 puffs every 12 hours to help breathing. Patient aware rx called in

## 2013-12-05 NOTE — Telephone Encounter (Signed)
Symbicort 160/4.5 : 2 puffs every 12 hours

## 2013-12-05 NOTE — Telephone Encounter (Signed)
Patient called and stated that she was seen yesterday by Dr. Nyoka Cowden and he was suppose to prescribe an inhaler. Nothing was called into pharmacy and patient wants to know if something can be called into [facility]. Please Advise.

## 2013-12-06 ENCOUNTER — Other Ambulatory Visit: Payer: Self-pay

## 2013-12-06 LAB — URINE CULTURE: Organism ID, Bacteria: NO GROWTH

## 2013-12-07 ENCOUNTER — Other Ambulatory Visit: Payer: Self-pay | Admitting: Internal Medicine

## 2013-12-11 ENCOUNTER — Telehealth: Payer: Self-pay | Admitting: *Deleted

## 2013-12-11 NOTE — Telephone Encounter (Signed)
Patient called requesting Ambien to help her sleep at night. States that the Xanax is not helping. Also states that she is on an antibiotic that Dr. Nyoka Cowden gave her at last OV and it is causing yeast in her mouth and she still has another week of it.  Please Advise.

## 2013-12-11 NOTE — Telephone Encounter (Signed)
pls have her bee seen for oral exam and to adjust sleep med

## 2013-12-11 NOTE — Telephone Encounter (Signed)
Patient does not want to make an appointment and will not come in to be seen. Patient states that she will just wait.

## 2013-12-14 ENCOUNTER — Other Ambulatory Visit: Payer: Self-pay | Admitting: Internal Medicine

## 2013-12-14 ENCOUNTER — Other Ambulatory Visit: Payer: Self-pay | Admitting: Emergency Medicine

## 2013-12-18 ENCOUNTER — Other Ambulatory Visit: Payer: Medicare Other

## 2013-12-18 DIAGNOSIS — E1165 Type 2 diabetes mellitus with hyperglycemia: Principal | ICD-10-CM

## 2013-12-18 DIAGNOSIS — R5383 Other fatigue: Secondary | ICD-10-CM | POA: Diagnosis not present

## 2013-12-18 DIAGNOSIS — R531 Weakness: Secondary | ICD-10-CM

## 2013-12-18 DIAGNOSIS — IMO0001 Reserved for inherently not codable concepts without codable children: Secondary | ICD-10-CM

## 2013-12-18 DIAGNOSIS — E785 Hyperlipidemia, unspecified: Secondary | ICD-10-CM

## 2013-12-18 DIAGNOSIS — R5381 Other malaise: Secondary | ICD-10-CM | POA: Diagnosis not present

## 2013-12-19 LAB — LIPID PANEL
CHOL/HDL RATIO: 3.3 ratio (ref 0.0–4.4)
Cholesterol, Total: 207 mg/dL — ABNORMAL HIGH (ref 100–199)
HDL: 63 mg/dL (ref >39)
LDL Calculated: 110 mg/dL — ABNORMAL HIGH (ref 0–99)
Triglycerides: 168 mg/dL — ABNORMAL HIGH (ref 0–149)
VLDL Cholesterol Cal: 34 mg/dL (ref 5–40)

## 2013-12-19 LAB — COMPREHENSIVE METABOLIC PANEL
A/G RATIO: 2.1 (ref 1.1–2.5)
ALT: 10 IU/L (ref 0–32)
AST: 19 IU/L (ref 0–40)
Albumin: 4.1 g/dL (ref 3.6–4.8)
Alkaline Phosphatase: 96 IU/L (ref 39–117)
BUN / CREAT RATIO: 9 — AB (ref 11–26)
BUN: 12 mg/dL (ref 8–27)
CHLORIDE: 98 mmol/L (ref 97–108)
CO2: 26 mmol/L (ref 18–29)
Calcium: 9.7 mg/dL (ref 8.7–10.3)
Creatinine, Ser: 1.28 mg/dL — ABNORMAL HIGH (ref 0.57–1.00)
GFR calc non Af Amer: 43 mL/min/{1.73_m2} — ABNORMAL LOW (ref >59)
GFR, EST AFRICAN AMERICAN: 49 mL/min/{1.73_m2} — AB (ref >59)
GLUCOSE: 123 mg/dL — AB (ref 65–99)
Globulin, Total: 2 g/dL (ref 1.5–4.5)
POTASSIUM: 4.4 mmol/L (ref 3.5–5.2)
SODIUM: 143 mmol/L (ref 134–144)
Total Bilirubin: 0.2 mg/dL (ref 0.0–1.2)
Total Protein: 6.1 g/dL (ref 6.0–8.5)

## 2013-12-19 LAB — HEMOGLOBIN A1C
Est. average glucose Bld gHb Est-mCnc: 117 mg/dL
HEMOGLOBIN A1C: 5.7 % — AB (ref 4.8–5.6)

## 2013-12-19 LAB — MICROALBUMIN, URINE: MICROALBUM., U, RANDOM: 10.8 ug/mL (ref 0.0–17.0)

## 2013-12-26 ENCOUNTER — Telehealth: Payer: Self-pay | Admitting: *Deleted

## 2013-12-26 NOTE — Telephone Encounter (Signed)
Patient called and stated that Dr. Nyoka Cowden gave her an antibiotic and now she has a yeast infection in her mouth and needs something called in for it. Needs it called to [facility] and [address]. Please Advise.

## 2013-12-26 NOTE — Telephone Encounter (Signed)
Patient also wanted a refill on her Xanaxx. INformed her to have the pharmacy fax a refill request.

## 2013-12-27 ENCOUNTER — Other Ambulatory Visit: Payer: Self-pay | Admitting: Internal Medicine

## 2013-12-27 MED ORDER — FLUCONAZOLE 100 MG PO TABS: ORAL_TABLET | ORAL | Status: DC

## 2013-12-27 MED ORDER — ALPRAZOLAM 1 MG PO TABS: ORAL_TABLET | ORAL | Status: DC

## 2013-12-27 NOTE — Telephone Encounter (Signed)
Per Dr. Arturo Morton 100mg  One tablet by mouth daily to treat yeast #3.  Patient Notified and phoned in Diflucan and Alprazolam to [facility]

## 2014-01-03 ENCOUNTER — Other Ambulatory Visit: Payer: Self-pay | Admitting: *Deleted

## 2014-01-03 DIAGNOSIS — M545 Low back pain, unspecified: Secondary | ICD-10-CM

## 2014-01-03 MED ORDER — HYDROCODONE-ACETAMINOPHEN 7.5-325 MG PO TABS: ORAL_TABLET | ORAL | Status: DC

## 2014-01-03 NOTE — Telephone Encounter (Signed)
Patient Requested and will pick up 

## 2014-01-04 ENCOUNTER — Ambulatory Visit: Payer: Medicare Other | Admitting: Emergency Medicine

## 2014-01-08 ENCOUNTER — Other Ambulatory Visit: Payer: Self-pay | Admitting: Internal Medicine

## 2014-01-09 ENCOUNTER — Encounter: Payer: Self-pay | Admitting: Internal Medicine

## 2014-01-09 ENCOUNTER — Ambulatory Visit (INDEPENDENT_AMBULATORY_CARE_PROVIDER_SITE_OTHER): Payer: Medicare Other | Admitting: Internal Medicine

## 2014-01-09 VITALS — BP 140/70 | HR 87 | Temp 98.2°F | Resp 12 | Ht 66.0 in | Wt 179.0 lb

## 2014-01-09 DIAGNOSIS — J449 Chronic obstructive pulmonary disease, unspecified: Secondary | ICD-10-CM

## 2014-01-09 DIAGNOSIS — J988 Other specified respiratory disorders: Secondary | ICD-10-CM

## 2014-01-09 DIAGNOSIS — C3490 Malignant neoplasm of unspecified part of unspecified bronchus or lung: Secondary | ICD-10-CM

## 2014-01-09 DIAGNOSIS — C3491 Malignant neoplasm of unspecified part of right bronchus or lung: Secondary | ICD-10-CM

## 2014-01-09 DIAGNOSIS — J22 Unspecified acute lower respiratory infection: Secondary | ICD-10-CM

## 2014-01-09 DIAGNOSIS — G4700 Insomnia, unspecified: Secondary | ICD-10-CM | POA: Insufficient documentation

## 2014-01-09 DIAGNOSIS — I70219 Atherosclerosis of native arteries of extremities with intermittent claudication, unspecified extremity: Secondary | ICD-10-CM | POA: Diagnosis not present

## 2014-01-09 DIAGNOSIS — J329 Chronic sinusitis, unspecified: Secondary | ICD-10-CM | POA: Diagnosis not present

## 2014-01-09 MED ORDER — TEMAZEPAM 15 MG PO CAPS: 15.0000 mg | ORAL_CAPSULE | Freq: Every evening | ORAL | Status: DC | PRN

## 2014-01-09 MED ORDER — CETIRIZINE-PSEUDOEPHEDRINE ER 5-120 MG PO TB12: ORAL_TABLET | ORAL | Status: DC

## 2014-01-09 NOTE — Progress Notes (Signed)
Patient ID: [name], female   DOB: Feb 29, 1946, 68 y.o.   MRN: [id number]      [facility]    Place of Service:   OFFICE    Allergies  Allergen Reactions  . Iohexol Shortness Of Breath and Other (See Comments)    "burns me up inside"    Pt does fine with 13 hour prep  01/17/12  . Other     CAN'T STAND THE SMELL OF PURE BLEACH; "HAVE TO BACK UP AND POUR AND DON'T BREATH IT TIL i GET CAP BACK ON; DILUTE IT TO SPRAY"  . Codeine Itching and Nausea And Vomiting    REACTION: GI upset  . Anoro Ellipta [Umeclidinium-Vilanterol]     Swelling of lips and mouth  . Protonix [Pantoprazole Sodium] Diarrhea    Chief Complaint  Patient presents with  . Acute Visit    Follow-up on sinuses and bronchitis, symptoms are better BUT still present  . Sleeping Problem    Trouble falling and staying asleep   . Results    Discuss lab rsults (copy printed)     HPI:  Acute Sinusitis: Pt states has been sick for 4 weeks now. Reports feels "alot better" since completing 14 day course of Levaquin, but not back to herself yet.   SINUSITIS, CHRONIC NOS: Reports usually gets sick this time of year, has seasonal allergies. Reports takes her Claritin every other day because she doesn't want "to build up a resistance to it."Has continued to have yellow sputum, now having some orange sputum. Sinus congestion, hoarseness continue. Sore throat x 5 days. C/o chills in evening, denies fevers.   Insomnia: Trouble falling asleep and staying asleep, currently taking Xanax x 2 and Flexeril at bedtime with no improvement.   COPD: Stable  Non-small cell lung cancer, right:     Medications: Patient's Medications  New Prescriptions   No medications on file  Previous Medications   ALBUTEROL-IPRATROPIUM (COMBIVENT) 18-103 MCG/ACT INHALER    every 4 (four) hours as needed. 2 puffs every for hours for lung cancer   ALPRAZOLAM (XANAX) 1 MG TABLET    TAKE 1 TABLET BY MOUTH THREE TIMES A DAY AS NEEDED ANXIETY   AMBULATORY NON FORMULARY MEDICATION    Lighter Weight Rollator with Padded Seat and Hand Brakes Dx: 496, 729.1, 722.52   ASPIRIN EC 325 MG TABLET    Take 325 mg by mouth daily.   BUDESONIDE-FORMOTEROL (SYMBICORT) 160-4.5 MCG/ACT INHALER    Inhale 2 puffs into the lungs every 12 (twelve) hours.   CALCIUM CITRATE-VITAMIN D (CITRACAL + D PO)    Take 2 tablets by mouth daily.   CITALOPRAM (CELEXA) 20 MG TABLET    One daily to help mood.   CYCLOBENZAPRINE (FLEXERIL) 5 MG TABLET    One at bed to relax muscles   DEXTROMETHORPHAN-GUAIFENESIN (MUCINEX DM) 30-600 MG PER 12 HR TABLET    One every 12 hours to help congestion and to suppress cough   DILTIAZEM (CARDIZEM CD) 240 MG 24 HR CAPSULE    TAKE 1 CAPSULE BY MOUTH EVERY DAY FOR BLOOD PRESSURE   FLUTICASONE (FLONASE) 50 MCG/ACT NASAL SPRAY    Place 2 sprays into both nostrils 2 (two) times daily.   GLUCOSE BLOOD TEST STRIP    Check blood sugar once daily  DX: 250.00   HYDROCODONE-ACETAMINOPHEN (NORCO) 7.5-325 MG PER TABLET    Take 1 tablet by mouth every 6 hours as needed for severe pain   LIDOCAINE (LIDODERM) 5 %  Place 1 patch onto the skin daily as needed. For shoulder pain.Remove & Discard patch within 12 hours or as directed by MD.   LORATADINE (CLARITIN) 10 MG TABLET    TAKE 1 TABLET BY MOUTH EVERY DAY   LOSARTAN (COZAAR) 50 MG TABLET    Take one tablet by mouth twice daily   METFORMIN (GLUCOPHAGE) 500 MG TABLET    One each morning to control diabetes   METOCLOPRAMIDE (REGLAN) 5 MG TABLET    TAKE 1 TABLET BY MOUTH 4 TIMES A DAY   MULTIPLE VITAMINS-MINERALS (CENTRUM SILVER PO)    Take 1 tablet by mouth daily.   NON FORMULARY    Place 3 L into the nose daily. Oxygen   POTASSIUM CHLORIDE (K-DUR,KLOR-CON) 10 MEQ TABLET    TAKE 1 TABLET BY MOUTH EVERY DAY   RANITIDINE (ZANTAC) 300 MG TABLET    TAKE 1 TABLET BY MOUTH EVERY NIGHT AT BEDTIME   SENNA (SENOKOT) 8.6 MG TABS TABLET    Take 1 tablet (8.6 mg total) by mouth at bedtime and may repeat dose one  time if needed. For constipation.   SIMETHICONE (GAS-X EXTRA STRENGTH PO)    Take 1 tablet by mouth at bedtime as needed. As needed for gas.   TRAMADOL (ULTRAM) 50 MG TABLET    TAKE 1 TABLET BY MOUTH THREE TIMES A DAY AS NEEDED FOR PAIN   TRIAMCINOLONE OINTMENT (KENALOG) 0.1 %    APPLY TOPICALLY TWICE DAILY   TRIAMTERENE-HYDROCHLOROTHIAZIDE (MAXZIDE-25) 37.5-25 MG PER TABLET    TAKE 1 TABLET BY MOUTH EVERY DAY TO CONTROL BLOOD PRESSURE   UMECLIDINIUM-VILANTEROL (ANORO ELLIPTA) 62.5-25 MCG/INH AEPB    Inhale into the lungs daily.  Modified Medications   No medications on file  Discontinued Medications   FLUCONAZOLE (DIFLUCAN) 100 MG TABLET    Take one tablet by mouth once daily to treat yeast   LEVOFLOXACIN (LEVAQUIN) 500 MG TABLET    Take 1 tablet (500 mg total) by mouth daily.   TRIAMCINOLONE OINTMENT (KENALOG) 0.1 %    RX was sent on 01-10-2013 with 5 refills     Review of Systems  Constitutional: Positive for activity change. Negative for fever and chills.  HENT: Positive for ear pain (right side), sinus pressure, sore throat and trouble swallowing. Negative for congestion.   Eyes: Negative.   Respiratory: Positive for cough and shortness of breath. Negative for choking, chest tightness, wheezing and stridor.   Cardiovascular: Negative for chest pain and palpitations.  Gastrointestinal: Positive for constipation and anal bleeding. Negative for nausea, abdominal pain, diarrhea and abdominal distention.       Reflux frequently  Musculoskeletal: Positive for myalgias, back pain, arthralgias, gait problem and neck pain.  Skin: Negative.   Neurological: Positive for dizziness, weakness (generalized) and light-headedness. Negative for tremors and syncope.  Hematological: Negative.   Psychiatric/Behavioral: Positive for sleep disturbance and dysphoric mood. Negative for hallucinations. The patient is not nervous/anxious and is not hyperactive.     Filed Vitals:   01/09/14 1443  BP: 140/70    Pulse: 87  Temp: 98.2 F (36.8 C)  TempSrc: Oral  Resp: 12  Height: 5\' 6"  (1.676 m)  Weight: 179 lb (81.194 kg)  SpO2: 97%   Body mass index is 28.91 kg/(m^2).  Physical Exam  Constitutional: She is oriented to person, place, and time.  overweight  HENT:  Nose: Nose normal.  Mouth/Throat: Oropharynx is clear and moist. No oropharyngeal exudate.  Tympanic membranes have a pink blush on both  sides.  Eyes: Conjunctivae and EOM are normal. Pupils are equal, round, and reactive to light.  Neck: No JVD present. No tracheal deviation present. No thyromegaly present.  Cardiovascular: Normal rate, regular rhythm and normal heart sounds.  Exam reveals no gallop and no friction rub.   No murmur heard. Pulmonary/Chest: No respiratory distress. She has no wheezes. She has no rales. She exhibits no tenderness.  Abdominal: She exhibits no distension and no mass. There is no tenderness.  Musculoskeletal: Normal range of motion. She exhibits edema and tenderness.  Lymphadenopathy:    She has no cervical adenopathy.  Neurological: She is oriented to person, place, and time. No cranial nerve deficit. Coordination normal.  Skin: No rash noted. No erythema. No pallor.  Psychiatric: Her behavior is normal. Judgment and thought content normal.     Labs reviewed: Appointment on 12/18/2013  Component Date Value Ref Range Status  . Glucose 12/18/2013 123* 65 - 99 mg/dL Final  . BUN 12/18/2013 12  8 - 27 mg/dL Final  . Creatinine, Ser 12/18/2013 1.28* 0.57 - 1.00 mg/dL Final  . GFR calc non Af Amer 12/18/2013 43* >59 mL/min/1.73 Final  . GFR calc Af Amer 12/18/2013 49* >59 mL/min/1.73 Final  . BUN/Creatinine Ratio 12/18/2013 9* 11 - 26 Final  . Sodium 12/18/2013 143  134 - 144 mmol/L Final  . Potassium 12/18/2013 4.4  3.5 - 5.2 mmol/L Final  . Chloride 12/18/2013 98  97 - 108 mmol/L Final  . CO2 12/18/2013 26  18 - 29 mmol/L Final  . Calcium 12/18/2013 9.7  8.7 - 10.3 mg/dL Final  . Total  Protein 12/18/2013 6.1  6.0 - 8.5 g/dL Final  . Albumin 12/18/2013 4.1  3.6 - 4.8 g/dL Final  . Globulin, Total 12/18/2013 2.0  1.5 - 4.5 g/dL Final  . Albumin/Globulin Ratio 12/18/2013 2.1  1.1 - 2.5 Final  . Total Bilirubin 12/18/2013 0.2  0.0 - 1.2 mg/dL Final  . Alkaline Phosphatase 12/18/2013 96  39 - 117 IU/L Final  . AST 12/18/2013 19  0 - 40 IU/L Final  . ALT 12/18/2013 10  0 - 32 IU/L Final  . Hemoglobin A1C 12/18/2013 5.7* 4.8 - 5.6 % Final   Comment:          Increased risk for diabetes: 5.7 - 6.4                                   Diabetes: >6.4                                   Glycemic control for adults with diabetes: <7.0  . Estimated average glucose 12/18/2013 117   Final  . Cholesterol, Total 12/18/2013 207* 100 - 199 mg/dL Final  . Triglycerides 12/18/2013 168* 0 - 149 mg/dL Final  . HDL 12/18/2013 63  >39 mg/dL Final   Comment: According to ATP-III Guidelines, HDL-C >59 mg/dL is considered a                          negative risk factor for CHD.  Marland Kitchen VLDL Cholesterol Cal 12/18/2013 34  5 - 40 mg/dL Final  . LDL Calculated 12/18/2013 110* 0 - 99 mg/dL Final  . Chol/HDL Ratio 12/18/2013 3.3  0.0 - 4.4 ratio units Final   Comment:  T. Chol/HDL Ratio                                                                      Men  Women                                                        1/2 Avg.Risk  3.4    3.3                                                            Avg.Risk  5.0    4.4                                                         2X Avg.Risk  9.6    7.1                                                         3X Avg.Risk 23.4   11.0  . Microalbum.,U,Random 12/18/2013 10.8  0.0 - 17.0 ug/mL Final  Office Visit on 12/04/2013  Component Date Value Ref Range Status  . Color, UA 12/04/2013 Dark Yellow   Final  . Clarity, UA 12/04/2013 Cloudy   Final  . Glucose, UA 12/04/2013 Neg   Final  . Bilirubin, UA 12/04/2013 Neg   Final  .  Ketones, UA 12/04/2013 Neg   Final  . Spec Grav, UA 12/04/2013 1.010   Final  . Blood, UA 12/04/2013 Trace   Final  . pH, UA 12/04/2013 7.5   Final  . Protein, UA 12/04/2013 Neg   Final  . Urobilinogen, UA 12/04/2013 0.2   Final  . Nitrite, UA 12/04/2013 Neg   Final  . Leukocytes, UA 12/04/2013 small (1+)   Final  . Urine Culture, Routine 12/04/2013 Final report   Final  . Result 1 12/04/2013 No growth   Final     Assessment/Plan 1. Insomnia - temazepam (RESTORIL) 15 MG capsule; Take 1 capsule (15 mg total) by mouth at bedtime as needed for sleep.  Dispense: 30 capsule; Refill: 0  2. SINUSITIS, CHRONIC NOS - cetirizine-pseudoephedrine (ZYRTEC-D) 5-120 MG per tablet; One twice daily to help congestion  Dispense: 60 tablet; Refill: 3 - Respiratory or Resp and Sputum Culture  3. COPD Stable  4. Non-small cell lung cancer, right Continues follow up with oncology  5. Acute Sinusitis Has finished antibiotics. Imroved.

## 2014-01-11 ENCOUNTER — Ambulatory Visit: Payer: Medicare Other | Admitting: Emergency Medicine

## 2014-01-22 ENCOUNTER — Other Ambulatory Visit: Payer: Self-pay | Admitting: Medical Oncology

## 2014-01-22 ENCOUNTER — Telehealth: Payer: Self-pay | Admitting: Medical Oncology

## 2014-01-22 DIAGNOSIS — C3490 Malignant neoplasm of unspecified part of unspecified bronchus or lung: Secondary | ICD-10-CM

## 2014-01-22 MED ORDER — PREDNISONE 50 MG PO TABS: ORAL_TABLET | ORAL | Status: DC

## 2014-01-22 NOTE — Telephone Encounter (Signed)
Refilled prednisone rx to preferred pharmacy per patient's request for CT contrast pre-meds. Instructed patient to buy benadryl OTC and take total of 50 mg 1 hour before scan. Pt informed benadryl can cause drowsiness and to please bring a driver to appt. Pt verbalizes understanding of the above.

## 2014-01-22 NOTE — Telephone Encounter (Signed)
erro

## 2014-01-25 ENCOUNTER — Ambulatory Visit (HOSPITAL_COMMUNITY)
Admission: RE | Admit: 2014-01-25 | Discharge: 2014-01-25 | Disposition: A | Discharge status: Home / Self Care | Payer: Medicare Other | Source: Ambulatory Visit | Attending: Internal Medicine | Admitting: Internal Medicine

## 2014-01-25 ENCOUNTER — Other Ambulatory Visit: Payer: Self-pay | Admitting: Internal Medicine

## 2014-01-25 ENCOUNTER — Other Ambulatory Visit: Payer: Self-pay | Admitting: Emergency Medicine

## 2014-01-25 ENCOUNTER — Other Ambulatory Visit (HOSPITAL_BASED_OUTPATIENT_CLINIC_OR_DEPARTMENT_OTHER): Payer: Medicare Other

## 2014-01-25 ENCOUNTER — Encounter (HOSPITAL_COMMUNITY): Payer: Self-pay

## 2014-01-25 DIAGNOSIS — S2231XD Fracture of one rib, right side, subsequent encounter for fracture with routine healing: Secondary | ICD-10-CM | POA: Diagnosis not present

## 2014-01-25 DIAGNOSIS — C3490 Malignant neoplasm of unspecified part of unspecified bronchus or lung: Secondary | ICD-10-CM

## 2014-01-25 DIAGNOSIS — C3492 Malignant neoplasm of unspecified part of left bronchus or lung: Secondary | ICD-10-CM | POA: Diagnosis not present

## 2014-01-25 DIAGNOSIS — C3411 Malignant neoplasm of upper lobe, right bronchus or lung: Secondary | ICD-10-CM | POA: Diagnosis not present

## 2014-01-25 LAB — COMPREHENSIVE METABOLIC PANEL (CC13)
ALBUMIN: 3.9 g/dL (ref 3.5–5.0)
ALK PHOS: 97 U/L (ref 40–150)
ALT: 16 U/L (ref 0–55)
AST: 16 U/L (ref 5–34)
Anion Gap: 15 mEq/L — ABNORMAL HIGH (ref 3–11)
BUN: 25.3 mg/dL (ref 7.0–26.0)
CO2: 25 mEq/L (ref 22–29)
Calcium: 11.1 mg/dL — ABNORMAL HIGH (ref 8.4–10.4)
Chloride: 99 mEq/L (ref 98–109)
Creatinine: 1.3 mg/dL — ABNORMAL HIGH (ref 0.6–1.1)
GLUCOSE: 157 mg/dL — AB (ref 70–140)
POTASSIUM: 4.1 meq/L (ref 3.5–5.1)
SODIUM: 139 meq/L (ref 136–145)
TOTAL PROTEIN: 7.1 g/dL (ref 6.4–8.3)
Total Bilirubin: 0.28 mg/dL (ref 0.20–1.20)

## 2014-01-25 LAB — CBC WITH DIFFERENTIAL/PLATELET
BASO%: 0 % (ref 0.0–2.0)
BASOS ABS: 0 10*3/uL (ref 0.0–0.1)
EOS ABS: 0 10*3/uL (ref 0.0–0.5)
EOS%: 0 % (ref 0.0–7.0)
HCT: 34.7 % — ABNORMAL LOW (ref 34.8–46.6)
HGB: 11.8 g/dL (ref 11.6–15.9)
LYMPH#: 1.2 10*3/uL (ref 0.9–3.3)
LYMPH%: 12.7 % — ABNORMAL LOW (ref 14.0–49.7)
MCH: 30 pg (ref 25.1–34.0)
MCHC: 34 g/dL (ref 31.5–36.0)
MCV: 88.3 fL (ref 79.5–101.0)
MONO#: 0.1 10*3/uL (ref 0.1–0.9)
MONO%: 0.8 % (ref 0.0–14.0)
NEUT#: 7.9 10*3/uL — ABNORMAL HIGH (ref 1.5–6.5)
NEUT%: 86.5 % — AB (ref 38.4–76.8)
PLATELETS: 245 10*3/uL (ref 145–400)
RBC: 3.93 10*6/uL (ref 3.70–5.45)
RDW: 12.8 % (ref 11.2–14.5)
WBC: 9.1 10*3/uL (ref 3.9–10.3)
nRBC: 0 % (ref 0–0)

## 2014-01-25 MED ORDER — IOHEXOL 300 MG/ML  SOLN
80.0000 mL | Freq: Once | INTRAMUSCULAR | Status: AC | PRN
  Administered 2014-01-25: 80 mL via INTRAVENOUS

## 2014-01-28 ENCOUNTER — Other Ambulatory Visit: Payer: Medicare Other

## 2014-01-28 ENCOUNTER — Ambulatory Visit: Payer: Medicare Other | Admitting: Internal Medicine

## 2014-01-29 ENCOUNTER — Telehealth: Payer: Self-pay | Admitting: *Deleted

## 2014-01-29 NOTE — Telephone Encounter (Signed)
Pt called wanting to know results of CT scan.  Pt missed her appts on 10/19 because she was not feeling well.  Per Dr Vista Mink, when she is feeling better she can r/s an appt and he will discuss the results with her at that time.  Pt verbalized understanding and asked for schedulers to schedule her next week.  Onc tx schedule filled out.

## 2014-01-30 ENCOUNTER — Telehealth: Payer: Self-pay | Admitting: Internal Medicine

## 2014-01-30 NOTE — Telephone Encounter (Signed)
s.w. pt she wanted appt this week...done....pt ok and aware of d.t

## 2014-01-31 ENCOUNTER — Ambulatory Visit (HOSPITAL_BASED_OUTPATIENT_CLINIC_OR_DEPARTMENT_OTHER): Payer: Medicare Other | Admitting: Physician Assistant

## 2014-01-31 ENCOUNTER — Encounter: Payer: Self-pay | Admitting: Physician Assistant

## 2014-01-31 VITALS — BP 119/64 | HR 91 | Temp 98.4°F | Resp 18 | Ht 66.0 in | Wt 178.1 lb

## 2014-01-31 DIAGNOSIS — Z85118 Personal history of other malignant neoplasm of bronchus and lung: Secondary | ICD-10-CM | POA: Diagnosis not present

## 2014-01-31 DIAGNOSIS — C3492 Malignant neoplasm of unspecified part of left bronchus or lung: Secondary | ICD-10-CM

## 2014-01-31 NOTE — Progress Notes (Addendum)
[facility] [phone]   Fax:(336) [address], [place], [place] [address]  DIAGNOSIS: Stage IIB/IIIA non-small cell lung cancer consistent with adenocarcinoma diagnosed in December 2012.   PRIOR THERAPY:  1) Status post radiation therapy to the right upper lobe lung mass completed on 06/04/2011 under the care of Dr. Pablo Ledger.  2) Systemic chemotherapy with carboplatin for AUC of 5 and Alimta 500 mg/M2 giving every 3 weeks, status post 3 cycles, last dose was given on 07/21/2011.   CURRENT THERAPY: Observation.  INTERVAL HISTORY: [name] 68 y.o. female returns to the clinic today for routine six month follow up visit. She reports some sinus and chest congestion. She states that she feels cold but she's had no fever or chills. She sometimes will cough and produce a small amount of blood but this is related to the coughing spell and she is not otherwise coughing up any blood.  She continues to have her baseline shortness of breath. She is currently on home oxygen. The patient denied having any significant chest pain except for occasional soreness of the right side of the chest. She denied having any significant weight loss or night sweats. She recently had a restaging CT scan of her chest and presents to discuss the results.  MEDICAL HISTORY: Past Medical History  Diagnosis Date  . 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 Restless legs syndrome (RLS)   . Chronic sinusitis   . Hypertension   . Hyperlipidemia   . Anemia, unspecified   . GERD (gastroesophageal reflux disease)   . Respiratory failure with hypoxia 01/02/2010  . Complication of anesthesia     slow to wake up  . Asthma   . Coronary artery disease     Dr. Burt Knack had stress test done  . Type II or unspecified type diabetes mellitus without mention of complication, not stated as uncontrolled   . Peripheral vascular disease   . PAD  (peripheral artery disease)   . Heart murmur   . Dysrhythmia     irregular  . Anginal pain   . History of blood clots     "2 in my aorta"  . Hard of hearing, right   . History of bronchitis   . Bronchial pneumonia     history  . COPD (chronic obstructive pulmonary disease)   . Emphysema   . Shortness of breath     "anytime really"  . Syncope and collapse   . Adenocarcinoma, lung     upper left lobe  . 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 Chemotherapy adverse reaction 09/15/11    "makes me light headed and pass out; this is 3rd blood transfusion since going thru it"  . History of blood transfusion   . Kidney infection     "related to chemo"  . Sinus headache     "all the time"  . Arthritis     "all over my body"  . Fibromyalgia   . Osteoporosis   . DDD (degenerative disc disease), lumbar   . Scoliosis   . Anxiety   . Mental disorder   . Reflux   . CHF (congestive heart failure)   . DVT (deep venous thrombosis)   . Hx of radiation therapy 04/28/11 -06/08/11    right upper lobe-lung  . Acute upper respiratory infections of unspecified site   . Pain in  thoracic spine   . Asymptomatic varicose veins   . Functional urinary incontinence   . Diseases of lips   . Other abnormal blood chemistry   . Effects of radiation, unspecified   . Anemia in neoplastic disease   . Muscle weakness (generalized)   . Dizziness and giddiness   . Spasm of muscle   . Unspecified vitamin D deficiency   . Hyperpotassemia   . Acute posthemorrhagic anemia   . Unspecified hearing loss   . Disorder of bone and cartilage, unspecified   . Screening for thyroid disorder   . Malignant neoplasm of bronchus and lung, unspecified site   . Hypopotassemia   . Atherosclerosis of native arteries of the extremities, unspecified   . Reflux esophagitis   . Unspecified constipation   . Type II or unspecified type diabetes mellitus with neurological manifestations,  uncontrolled   . Unspecified hereditary and idiopathic peripheral neuropathy   . Urinary tract infection, site not specified   . Muscle weakness (generalized)   . Acute sinusitis, unspecified   . Herpes zoster without mention of complication   . 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 Type II or unspecified type diabetes mellitus without mention of complication, not stated as uncontrolled   . Other and unspecified hyperlipidemia   . Anxiety state, unspecified   . Depressive disorder, not elsewhere classified   . Unspecified essential hypertension   . Atrial fibrillation   . Chronic airway obstruction, not elsewhere classified   . Osteoarthrosis, unspecified whether generalized or localized, unspecified site   . Lumbago     ALLERGIES:  is allergic to iohexol; other; codeine; anoro ellipta; and protonix.  MEDICATIONS:  Current Outpatient Prescriptions  Medication Sig Dispense Refill  . albuterol-ipratropium (COMBIVENT) 18-103 MCG/ACT inhaler every 4 (four) hours as needed. 2 puffs every for hours for lung cancer      . ALPRAZolam (XANAX) 1 MG tablet TAKE 1 TABLET BY MOUTH THREE TIMES A DAY AS NEEDED FOR ANXIETY  90 tablet  0  . AMBULATORY NON FORMULARY MEDICATION Lighter Weight Rollator with Padded Seat and Hand Brakes Dx: 496, 729.1, 722.52  1 each  0  . aspirin EC 325 MG tablet Take 325 mg by mouth daily.      . budesonide-formoterol (SYMBICORT) 160-4.5 MCG/ACT inhaler Inhale 2 puffs into the lungs every 12 (twelve) hours.  1 Inhaler  2  . Calcium Citrate-Vitamin D (CITRACAL + D PO) Take 2 tablets by mouth daily.      . cetirizine-pseudoephedrine (ZYRTEC-D) 5-120 MG per tablet One twice daily to help congestion  60 tablet  3  . citalopram (CELEXA) 20 MG tablet One daily to help mood.  90 tablet  3  . COMBIVENT RESPIMAT 20-100 MCG/ACT AERS respimat INHALE 1 PUFF BY MOUTH EVERY 6 HOURS AS NEEDED FOR SHORTNESS OF BREATH  12 g  1  . dextromethorphan-guaiFENesin (MUCINEX DM) 30-600 MG  per 12 hr tablet One every 12 hours to help congestion and to suppress cough  60 tablet  3  . diltiazem (CARDIZEM CD) 240 MG 24 hr capsule TAKE 1 CAPSULE BY MOUTH EVERY DAY FOR BLOOD PRESSURE  90 capsule  1  . fluticasone (FLONASE) 50 MCG/ACT nasal spray INSTILL 2 SPRAYS INTO BOTH NOSTRILS  TWICE DAILY  1 g  0  . glucose blood test strip Check blood sugar once daily  DX: 250.00      . HYDROcodone-acetaminophen (NORCO) 7.5-325 MG per tablet Take 1 tablet by mouth  every 6 hours as needed for severe pain  120 tablet  0  . lidocaine (LIDODERM) 5 % Place 1 patch onto the skin daily as needed. For shoulder pain.Remove & Discard patch within 12 hours or as directed by MD.  30 patch  0  . losartan (COZAAR) 50 MG tablet Take one tablet by mouth twice daily  60 tablet  5  . metFORMIN (GLUCOPHAGE) 500 MG tablet One each morning to control diabetes  60 tablet  3  . metoCLOPramide (REGLAN) 5 MG tablet TAKE 1 TABLET BY MOUTH 4 TIMES A DAY  120 tablet  5  . Multiple Vitamins-Minerals (CENTRUM SILVER PO) Take 1 tablet by mouth daily.      . NON FORMULARY Place 3 L into the nose daily. Oxygen      . potassium chloride (K-DUR,KLOR-CON) 10 MEQ tablet TAKE 1 TABLET BY MOUTH EVERY DAY  30 tablet  5  . ranitidine (ZANTAC) 300 MG tablet TAKE 1 TABLET BY MOUTH EVERY NIGHT AT BEDTIME  30 tablet  3  . senna (SENOKOT) 8.6 MG TABS tablet Take 1 tablet (8.6 mg total) by mouth at bedtime and may repeat dose one time if needed. For constipation.  120 each  6  . Simethicone (GAS-X EXTRA STRENGTH PO) Take 1 tablet by mouth at bedtime as needed. As needed for gas.      . temazepam (RESTORIL) 15 MG capsule Take 1 capsule (15 mg total) by mouth at bedtime as needed for sleep.  30 capsule  0  . traMADol (ULTRAM) 50 MG tablet TAKE 1 TABLET BY MOUTH THREE TIMES A DAY AS NEEDED FOR PAIN  90 tablet  0  . triamcinolone ointment (KENALOG) 0.1 % APPLY TOPICALLY TWICE DAILY  60 g  4  . triamterene-hydrochlorothiazide (MAXZIDE-25) 37.5-25 MG  per tablet TAKE 1 TABLET BY MOUTH EVERY DAY TO CONTROL BLOOD PRESSURE  30 tablet  3  . Umeclidinium-Vilanterol (ANORO ELLIPTA) 62.5-25 MCG/INH AEPB Inhale into the lungs daily.      . cyclobenzaprine (FLEXERIL) 5 MG tablet One at bed to relax muscles  30 tablet  4  . predniSONE (DELTASONE) 50 MG tablet Take 50mg  13 hours, 7 hours, and 1 hour before CT scan.  3 tablet  0  . [DISCONTINUED] calcium carbonate 200 MG capsule Take 250 mg by mouth daily. Does not take everyday       No current facility-administered medications for this visit.   Facility-Administered Medications Ordered in Other Visits  Medication Dose Route Frequency Provider Last Rate Last Dose  . hyaluronate sodium (RADIAPLEXRX) gel   Topical Once Thea Silversmith, MD      . topical emolient (BIAFINE) emulsion   Topical PRN Thea Silversmith, MD        SURGICAL HISTORY:  Past Surgical History  Procedure Laterality Date  . Lung lobectomy  1986    left  . Septoplasty    . Total hip arthroplasty  01/2010    left  . Bronchoscopy  02/2011  . Dilation and curettage of uterus  1980's  . Tubal ligation  1980's  . Cardiac catheterization    . Fetal blood transfusion  09/16/11  . Joint replacement  04/13/10    Left Hip  . Cataract extraction  03/12/2013    Left Eye     REVIEW OF SYSTEMS:  A comprehensive review of systems was negative except for: Respiratory: positive for dyspnea on exertion and pleurisy/chest pain   PHYSICAL EXAMINATION: General appearance: alert, cooperative and no  distress Head: Normocephalic, without obvious abnormality, atraumatic Neck: no adenopathy, no JVD, supple, symmetrical, trachea midline and thyroid not enlarged, symmetric, no tenderness/mass/nodules Lymph nodes: Cervical, supraclavicular, and axillary nodes normal. Resp: clear to auscultation bilaterally Back: symmetric, no curvature. ROM normal. No CVA tenderness. Cardio: regular rate and rhythm, S1, S2 normal, no murmur, click, rub or gallop GI:  soft, non-tender; bowel sounds normal; no masses,  no organomegaly Extremities: extremities normal, atraumatic, no cyanosis or edema  ECOG PERFORMANCE STATUS: 1 - Symptomatic but completely ambulatory  Blood pressure 119/64, pulse 91, temperature 98.4 F (36.9 C), temperature source Oral, resp. rate 18, height 5\' 6"  (1.676 m), weight 178 lb 1.6 oz (80.786 kg), SpO2 98.00%.  LABORATORY DATA: Lab Results  Component Value Date   WBC 9.1 01/25/2014   HGB 11.8 01/25/2014   HCT 34.7* 01/25/2014   MCV 88.3 01/25/2014   PLT 245 01/25/2014      Chemistry      Component Value Date/Time   NA 139 01/25/2014 1308   NA 143 12/18/2013 1122   NA 138 05/23/2012 1009   NA 142 10/25/2011 1200   K 4.1 01/25/2014 1308   K 4.4 12/18/2013 1122   K 4.6 10/25/2011 1200   CL 98 12/18/2013 1122   CL 99 07/21/2012 1125   CL 97* 10/25/2011 1200   CO2 25 01/25/2014 1308   CO2 26 12/18/2013 1122   CO2 24 10/25/2011 1200   BUN 25.3 01/25/2014 1308   BUN 12 12/18/2013 1122   BUN 22 05/23/2012 1009   BUN 15 10/25/2011 1200   CREATININE 1.3* 01/25/2014 1308   CREATININE 1.28* 12/18/2013 1122   CREATININE 0.8 10/25/2011 1200      Component Value Date/Time   CALCIUM 11.1* 01/25/2014 1308   CALCIUM 9.7 12/18/2013 1122   CALCIUM 9.6 10/25/2011 1200   ALKPHOS 97 01/25/2014 1308   ALKPHOS 96 12/18/2013 1122   ALKPHOS 106* 10/25/2011 1200   AST 16 01/25/2014 1308   AST 19 12/18/2013 1122   AST 25 10/25/2011 1200   ALT 16 01/25/2014 1308   ALT 10 12/18/2013 1122   ALT 14 10/25/2011 1200   BILITOT 0.28 01/25/2014 1308   BILITOT 0.2 12/18/2013 1122   BILITOT 0.60 10/25/2011 1200       RADIOGRAPHIC STUDIES: Ct Chest W Contrast  01/25/2014   CLINICAL DATA:  Lung cancer diagnosed on the left in 1986 and on the right in 2012. Left lobectomy. Right-sided radiation therapy and chemotherapy COPD. Emphysema.Non-small cell lung cancer C34.90 (ICD-10-CM).  EXAM: CT CHEST WITH CONTRAST  TECHNIQUE: Multidetector CT imaging of the chest was  performed during intravenous contrast administration.  CONTRAST:  33mL OMNIPAQUE IOHEXOL 300 MG/ML  SOLN  COMPARISON:  07/24/2013  FINDINGS: Lungs/Pleura: Moderate centrilobular emphysema. Similar endobronchial narrowing including at the the right upper lobe segmental and right lower lobe subsegmental branches. left upper lobectomy.  Ground-glass opacity measures 6.1 x 3.8 cm on image 28 versus 5.9 x 3.3 cm on the prior exam (when remeasured). 2.5 cm craniocaudal today versus similar on the prior. 5.5 x 3.4 cm back on 01/22/2013.  Similar configuration of volume loss in the right infrahilar and paramediastinal region. No well-defined locally recurrent disease.  Similar right pleural thickening.  Heart/Mediastinum: No supraclavicular adenopathy. Normal heart size, without pericardial effusion. Extensive aortic atherosclerosis. Multivessel coronary artery atherosclerosis. No central pulmonary embolism, on this non-dedicated study. No mediastinal or hilar adenopathy. Mildly dilated, fluid-filled esophagus on image 28. Similar small prevascular nodes, without adenopathy.  Upper Abdomen:  Mild hepatic steatosis.  Cholelithiasis.  Normal imaged portions of the spleen, stomach, pancreas, kidneys. Mild bilateral adrenal thickening and nodularity, unchanged.  Bones/Musculoskeletal: Bilateral humeral head avascular necrosis, similar. Healing posterior right fifth rib fracture, including on image 21 of series 2. There is subtle underlying heterogeneous density and possible heterogeneous density involving the posterior right third rib. Example image 14. There is overall similar. Vertebral augmentation at T4 and T5.  IMPRESSION: 1. Surgical changes of left upper lobectomy with presumed radiation changes in the right hemi thorax. No evidence of locally recurrent or extra osseous metastatic disease. 2. Suspect slight interval enlargement of a ground-glass opacity within the remaining right lung (the right upper or right middle  lobe). Cannot exclude indolent neoplasm such as low-grade adenocarcinoma. Recommend attention on follow-up. 3. Cholelithiasis. 4. Healing fifth right rib fracture. Suggestion of heterogeneous density within this rib and possibly the right posterior third rib. Correlate with trauma in these areas. Alternatively, this could be pathologic and related to radiation therapy or osseous metastasis. Of note, this appears new since the 01/2012/14 exam. 5. Osteopenia with compression deformities at T4 and T5, status post vertebral augmentation. 6. Bilateral humeral head avascular necrosis.   Electronically Signed   By: Abigail Miyamoto M.D.   On: 01/25/2014 17:02   ASSESSMENT AND PLAN: this is a very pleasant 68 years old white female with history of stage IIB/IIIA non-small cell lung cancer status post concurrent chemoradiation followed by consolidation chemotherapy with carboplatin and Alimta and has been observation since April 2013. She has no overt evidence for disease progression on her recent scan. We'll pay close attention to the area of groundglass deformity on her next restaging CT scan. Patient was discussed with and also seen by [name]. She will followup in 6 months with repeat CT scan of the chest. She was advised to call immediately if she has any concerning symptoms in the interval.  The patient voices understanding of current disease status and treatment options and is in agreement with the current care plan.  All questions were answered. The patient knows to call the clinic with any problems, questions or concerns. We can certainly see the patient much sooner if necessary.  Carlton Adam PA-C  ADDENDUM:  Hematology/Oncology Attending:  I had a face to face encounter with the patient. I recommended her care plan. This is a very pleasant 68 years old white female with history of unresectable as stage IIB/IIIa non-small cell lung cancer status post radiotherapy to the lung followed by systemic  chemotherapy and has been on observation since April 2013. Her recent CT scan of the chest showed no evidence for disease progression. I discussed the scan results with the patient and recommended for her to continue on observation with repeat CT scan of the chest in 6 months. She would come back for followup visit at that time. The patient was advised to call immediately if she has any concerning symptoms in the interval.  Disclaimer: This note was dictated with voice recognition software. Similar sounding words can inadvertently be transcribed and may not be corrected upon review. Eilleen Kempf., MD 02/05/2014

## 2014-02-01 ENCOUNTER — Telehealth: Payer: Self-pay | Admitting: Internal Medicine

## 2014-02-01 ENCOUNTER — Other Ambulatory Visit: Payer: Self-pay

## 2014-02-01 DIAGNOSIS — M545 Low back pain, unspecified: Secondary | ICD-10-CM

## 2014-02-01 MED ORDER — HYDROCODONE-ACETAMINOPHEN 7.5-325 MG PO TABS: ORAL_TABLET | ORAL | Status: DC

## 2014-02-01 NOTE — Telephone Encounter (Signed)
s.w. pt and advised on April 2016 appt...mailed pt appt sched/avs and letter

## 2014-02-03 NOTE — Patient Instructions (Signed)
Your CT scan revealed stable disease Followup in 6 months with another restaging CT scan of your chest to reevaluate your disease

## 2014-02-06 ENCOUNTER — Other Ambulatory Visit: Payer: Self-pay | Admitting: Internal Medicine

## 2014-02-06 ENCOUNTER — Other Ambulatory Visit: Payer: Self-pay | Admitting: Emergency Medicine

## 2014-02-06 ENCOUNTER — Ambulatory Visit: Payer: Medicare Other | Admitting: Internal Medicine

## 2014-02-12 ENCOUNTER — Ambulatory Visit (INDEPENDENT_AMBULATORY_CARE_PROVIDER_SITE_OTHER): Payer: Medicare Other | Admitting: *Deleted

## 2014-02-12 ENCOUNTER — Encounter: Payer: Self-pay | Admitting: Emergency Medicine

## 2014-02-12 ENCOUNTER — Ambulatory Visit (INDEPENDENT_AMBULATORY_CARE_PROVIDER_SITE_OTHER): Payer: Medicare Other | Admitting: Emergency Medicine

## 2014-02-12 VITALS — BP 126/88 | HR 98 | Temp 98.1°F | Ht 66.0 in | Wt 182.0 lb

## 2014-02-12 DIAGNOSIS — J449 Chronic obstructive pulmonary disease, unspecified: Secondary | ICD-10-CM | POA: Diagnosis not present

## 2014-02-12 DIAGNOSIS — Z23 Encounter for immunization: Secondary | ICD-10-CM | POA: Diagnosis not present

## 2014-02-12 DIAGNOSIS — J019 Acute sinusitis, unspecified: Secondary | ICD-10-CM | POA: Diagnosis not present

## 2014-02-12 DIAGNOSIS — C3492 Malignant neoplasm of unspecified part of left bronchus or lung: Secondary | ICD-10-CM | POA: Diagnosis not present

## 2014-02-12 DIAGNOSIS — I70219 Atherosclerosis of native arteries of extremities with intermittent claudication, unspecified extremity: Secondary | ICD-10-CM

## 2014-02-12 NOTE — Progress Notes (Signed)
Subjective:    Patient ID: [name], female    DOB: [birth year], 68 y.o.   MRN: [id number] HPI History of Present Illness:  [name] is a 68 y.o. 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 Has received XRT and chemo with Drs Pablo Ledger and Julien Nordmann.  She tolerated XRT completely. She has completed 3 cycles chemo.  Currently managed on combivent qid, started O2.   ROV 11/26/11 -- COPD with adenoCA 12/12 (chemo/XRT).  Presents today reporting R back and shoulder pain - is musculoskeletal, is involving R chest and R shoulder as well. She was exerting herself 3 weeks ago, believes she was sore afterwards and now with muscle pain. Dr Guinevere Ferrari gave her flexeril. [facility], Dr Hardin Negus. She is having more cough, which is exacerbating her back and shoulder pain.   ROV 03/03/12 -- COPD with adenoCA 12/12 (chemo/XRT). Currently on combivent, taking it prn. 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 Stable CT scan on 01/17/12. She is having more SOB. Was started on avelox recently for sinusitis, no other exacerbations. Marginal compliance with her O2  ROV 05/16/12 -- COPD with adenoCA 12/12. She developed URI sx, nasal obstruction, clear to yellow nasal congestion, scratchy throat. She tried restarting restarting claritin and mucinex-D a couple weeks ago. She has been using both prn - little improvement. She has a throat trickle, cough that is sometimes productive - white, yellow, clear. Using combivent twice a day for last two weeks > helps her cough.   Acute OV 12/27/12 -- COPD with adenoCA 12/12 (chemo/XRT). For last 2 weeks has had nasal drainage, low energy, cough usually non-prod, more indigestion. Was treated w Augmentin. She started mucinex DM x 2 weeks. She stopped loratadine 3 weeks ago because pressure in her head. Last CT scan was in 07/2012. Due to see Dr Julien Nordmann next month.   ROV 05/01/13 -- COPD with adenoCA  12/12 (chemo/XRT). Her last CT scan was done 01/22/13, read as stable from CA standpoint. She is still dealing with cough, dyspnea and a smothering feeling. She wears O2 prn, with most exertion, at 2.5L/min. She is using combivent about 4x a day.   ROV 08/31/13 -- COPD with adenoCA 12/12 (chemo/XRT). CT scan 08/23/13 was stable. O2 dependent w exertion.  She has baseline exertional SOB. starting about 6 weeks ago began to have nasal drainage, green mucous. She changed from loratadine to zyrtec-D. Uses nasal saline spray. Has never tolerated other nasal sprays due to dryness. Uses combivent qid.   ROV 10/23/13 -- COPD with RUL adenoCA 12/'12 (chemo/XRT). Last Ct chest was 07/24/13. She reports that she has been experiencing am B am numbness, some R pectoral and shoulder pain. Of note she has been using and rolling her wheelchair. Her breathing is very limiting. She has nasal congestion and was recently started on mucinex D. Hasn't been taking loratadine. She is using clombivent ~4 times a day.   ROV 12/04/13 -- COPD with RUL adenoCA 12/'12 (chemo/XRT). Since last time she has had significant difficulty with her inhaled meds. I tried to change her combivent to Anoro, but she was unable to tolerate due to tongue and lip swelling. I changed her to spiriva and symbicort > she felt little response, sore throat, maybe tongue swelling. Tried Anoro again and the tongue swelling happened again. She feels that the Anoro helps her breathing the most, but she has reliably had  the side effects. She is back to combivent qid now.  She is having yellow nasal drainage, green sputum.   ROV 02/12/14 -- follow up for COPD and RUL adenoCA (2012). Last time we continued combivent qid, also treated with levaquin for suspected sinusitis. She complains today of nasal congestion, drainage, ear and sinus fullness.       Objective:   Physical Exam Filed Vitals:   02/12/14 1408  BP: 126/88  Pulse: 98  Temp: 98.1 F (36.7 C)   Height: 5\' 6"  (1.676 m)  Weight: 182 lb (82.555 kg)  SpO2: 99%   Gen: Pleasant, elderly woman, in no distress,  normal affect  ENT: No lesions,  mouth clear,  oropharynx clear, no postnasal drip  Neck: No JVD, no TMG, no carotid bruits  Lungs: No use of accessory muscles, distant, no wheezes or crackles  Cardiovascular: RRR, heart sounds normal, no murmur or gallops, no peripheral edema  Musculoskeletal: No deformities, no cyanosis or clubbing. Tender to palp of the R pectoral area  Neuro: alert, non focal  Skin: Warm, no lesions or rashes   07/24/13 --  COMPARISON: 01/22/2013  FINDINGS:  There is no pleural effusion identified. Centrilobular emphysema.  Consolidation, radiation fibrosis and volume loss are seen in the  right perihilar region with involvement of primarily the right upper  lobe. This is unchanged in appearance from the previous exam.  Ground-glass attenuation in the right middle lobe measures 5.7 cm,  image 25/series 7 and appears unchanged from the previous exam.  Anterior right middle lobe nodule is unchanged at 6 mm, image  29/series 7. There are postoperative changes from left upper  lobectomy. Nonspecific area of ground-glass attenuation in the left  lower lobe appears stable from previous exam, image 26/series 7.  The heart size appears normal. There is no pericardial effusion.  Calcified atherosclerotic disease involves the thoracic aorta. There  are calcifications involving the LAD Coronary artery.  Incidental imaging through the upper abdomen shows mild diffuse low  attenuation within the liver parenchyma. There is a nodule in the  right adrenal gland which measures 1.5 cm, image 57/series 2. This  is unchanged from previous exam. Left adrenal nodule is stable  measuring 1.3 cm, image 57/series 2.  Review of the visualized bony structures shows stable compression  deformities involving T4 and T5. The T4 vertebra has been treated  with bone cement.  Next  IMPRESSION:  1. Stable postoperative and post treatment changes in the lungs  bilaterally.  2. No change in patchy area of ground-glass attenuation in the right  hemi thorax.  3. Resolution of small right pleural effusion.  4. Hepatic steatosis.  5. Stable bilateral adrenal nodules.  6. No change in T4 and T5 compression fractures      Assessment & Plan:  Non-small cell lung cancer Slight enlargement of groundglass infiltrate noted on most recent CT scan. She will review these findings with [name] and depending on their suspicion may decide to follow serial scans versus initiate further treatment.   COPD (chronic obstructive pulmonary disease) - will continue combivent - will maximize sinus routine, no evidence current sinusitis.  - rov 3  Sinusitis Significant sinus disease but this does not appear to be infectious in nature. I believe she has allergic rhinitis that needs to be treated more aggressively.

## 2014-02-12 NOTE — Patient Instructions (Signed)
Please continue your combivent 4 times a day Wear your oxygen at all times. Use albuterol 2 puffs if needed for shortness of breath Continue Zyrtec-D Increase flonase to 2 sprays each nostril twice a day Start doing nasal saline washes daily Continue mucinex 600 mg daily Follow with Dr Lamonte Sakai in 3 months or sooner if you have any problems.

## 2014-02-12 NOTE — Assessment & Plan Note (Signed)
Slight enlargement of groundglass infiltrate noted on most recent CT scan. She will review these findings with [name] and depending on their suspicion may decide to follow serial scans versus initiate further treatment.

## 2014-02-12 NOTE — Assessment & Plan Note (Signed)
Significant sinus disease but this does not appear to be infectious in nature. I believe she has allergic rhinitis that needs to be treated more aggressively.

## 2014-02-12 NOTE — Assessment & Plan Note (Signed)
-   will continue combivent - will maximize sinus routine, no evidence current sinusitis.  - rov 3

## 2014-02-13 ENCOUNTER — Telehealth: Payer: Self-pay | Admitting: *Deleted

## 2014-02-13 NOTE — Telephone Encounter (Signed)
Pt called stating "Dr Lamonte Sakai said something had increased on my scan and I wanted to see what Dr Vista Mink thinks".  Per Adrena's office note 10/22, he is going to continue to keep pt on observation x 6 months and monitor closely a place that was seen on the scan.  Informed pt, she verbalized understanding.

## 2014-02-25 ENCOUNTER — Telehealth: Payer: Self-pay | Admitting: *Deleted

## 2014-02-25 NOTE — Telephone Encounter (Signed)
It is okay to increase the temazepam to 30 mg nightly. Dispense prescription with 30 capsules and four refills.

## 2014-02-25 NOTE — Telephone Encounter (Signed)
Patient called and stated that she has ran out of the Temazepam 15mg  but the medication did not work well and is requesting it to be increased to the Temazepam 30mg . Please Advise.

## 2014-02-26 MED ORDER — TEMAZEPAM 30 MG PO CAPS: ORAL_CAPSULE | ORAL | Status: DC

## 2014-02-26 NOTE — Telephone Encounter (Signed)
Printed Rx for signature and will fax to pharmacy. Tried calling patient and cannot leave voicemail.

## 2014-02-27 NOTE — Telephone Encounter (Signed)
Patient Notified and Rx Faxed to Pharmacy

## 2014-02-28 ENCOUNTER — Other Ambulatory Visit: Payer: Self-pay | Admitting: Internal Medicine

## 2014-03-01 ENCOUNTER — Other Ambulatory Visit: Payer: Self-pay | Admitting: *Deleted

## 2014-03-01 DIAGNOSIS — M545 Low back pain, unspecified: Secondary | ICD-10-CM

## 2014-03-01 MED ORDER — HYDROCODONE-ACETAMINOPHEN 7.5-325 MG PO TABS: ORAL_TABLET | ORAL | Status: DC

## 2014-03-06 ENCOUNTER — Ambulatory Visit: Payer: Self-pay | Admitting: Internal Medicine

## 2014-03-06 ENCOUNTER — Other Ambulatory Visit: Payer: Self-pay | Admitting: Emergency Medicine

## 2014-03-12 ENCOUNTER — Ambulatory Visit (INDEPENDENT_AMBULATORY_CARE_PROVIDER_SITE_OTHER): Payer: Medicare Other | Admitting: Internal Medicine

## 2014-03-12 ENCOUNTER — Encounter: Payer: Self-pay | Admitting: Internal Medicine

## 2014-03-12 VITALS — BP 130/58 | HR 94 | Temp 98.2°F | Resp 18 | Ht 66.0 in | Wt 179.6 lb

## 2014-03-12 DIAGNOSIS — R05 Cough: Secondary | ICD-10-CM

## 2014-03-12 DIAGNOSIS — E1142 Type 2 diabetes mellitus with diabetic polyneuropathy: Secondary | ICD-10-CM | POA: Diagnosis not present

## 2014-03-12 DIAGNOSIS — R1013 Epigastric pain: Secondary | ICD-10-CM

## 2014-03-12 DIAGNOSIS — G4700 Insomnia, unspecified: Secondary | ICD-10-CM

## 2014-03-12 DIAGNOSIS — I70219 Atherosclerosis of native arteries of extremities with intermittent claudication, unspecified extremity: Secondary | ICD-10-CM

## 2014-03-12 DIAGNOSIS — K2100 Gastro-esophageal reflux disease with esophagitis, without bleeding: Secondary | ICD-10-CM

## 2014-03-12 DIAGNOSIS — C3491 Malignant neoplasm of unspecified part of right bronchus or lung: Secondary | ICD-10-CM

## 2014-03-12 DIAGNOSIS — R053 Chronic cough: Secondary | ICD-10-CM

## 2014-03-12 DIAGNOSIS — G629 Polyneuropathy, unspecified: Secondary | ICD-10-CM | POA: Diagnosis not present

## 2014-03-12 DIAGNOSIS — D649 Anemia, unspecified: Secondary | ICD-10-CM | POA: Diagnosis not present

## 2014-03-12 DIAGNOSIS — J449 Chronic obstructive pulmonary disease, unspecified: Secondary | ICD-10-CM | POA: Diagnosis not present

## 2014-03-12 DIAGNOSIS — R1310 Dysphagia, unspecified: Secondary | ICD-10-CM | POA: Diagnosis not present

## 2014-03-12 DIAGNOSIS — E1140 Type 2 diabetes mellitus with diabetic neuropathy, unspecified: Secondary | ICD-10-CM | POA: Diagnosis not present

## 2014-03-12 DIAGNOSIS — C3492 Malignant neoplasm of unspecified part of left bronchus or lung: Secondary | ICD-10-CM

## 2014-03-12 MED ORDER — RANITIDINE HCL 300 MG PO TABS: 300.0000 mg | ORAL_TABLET | Freq: Every day | ORAL | Status: DC

## 2014-03-12 MED ORDER — ALPRAZOLAM 1 MG PO TABS: ORAL_TABLET | ORAL | Status: DC

## 2014-03-12 NOTE — Progress Notes (Signed)
Patient ID: [name], female   DOB: 09-24-1945, 68 y.o.   MRN: [id number]    Facility  [facility]    Place of Service:   OFFICE   Allergies  Allergen Reactions  . Iohexol Shortness Of Breath and Other (See Comments)    "burns me up inside"    Pt does fine with 13 hour prep  01/17/12  . Other     CAN'T STAND THE SMELL OF PURE BLEACH; "HAVE TO BACK UP AND POUR AND DON'T BREATH IT TIL i GET CAP BACK ON; DILUTE IT TO SPRAY"  . Codeine Itching and Nausea And Vomiting    REACTION: GI upset  . Anoro Ellipta [Umeclidinium-Vilanterol]     Swelling of lips and mouth  . Protonix [Pantoprazole Sodium] Diarrhea    Chief Complaint  Patient presents with  . Medical Management of Chronic Issues    HPI:  Non-small cell lung cancer, right: Continues under oncologic and pulmonary care.  Abdominal pain, epigastric: Intermittent. No nausea.  Anemia, unspecified anemia type: Improved. Last hemoglobin 11.8  Chronic obstructive pulmonary disease, unspecified COPD, unspecified chronic bronchitis type: Continues with cough, congestion, bronchial rattle, and dyspnea on exertion.  Chronic cough: Persistent. Mostly nonproductive, but occasionally produces some white to clear yellow phlegm. No blood in sputum.  Dysphagia: Mild. Maintaining her weight.  Insomnia: Temazepam seem to cause itching. She only used it a couple of times. She has resumed her cyclobenzaprine on her own and is using it for rest at night.    Medications: Patient's Medications  New Prescriptions   No medications on file  Previous Medications   ALBUTEROL-IPRATROPIUM (COMBIVENT) 18-103 MCG/ACT INHALER    every 4 (four) hours as needed. 2 puffs every for hours for lung cancer   ALPRAZOLAM (XANAX) 1 MG TABLET    TAKE 1 TABLET BY MOUTH THREE TIMES A DAY AS NEEDED FOR ANXIETY   AMBULATORY NON FORMULARY MEDICATION    Lighter Weight Rollator with Padded Seat and Hand Brakes Dx: 496, 729.1, 722.52   ASPIRIN EC 325 MG TABLET    Take  325 mg by mouth daily.   BUDESONIDE-FORMOTEROL (SYMBICORT) 160-4.5 MCG/ACT INHALER    Inhale 2 puffs into the lungs every 12 (twelve) hours.   CALCIUM CITRATE-VITAMIN D (CITRACAL + D PO)    Take 2 tablets by mouth daily.   CETIRIZINE-PSEUDOEPHEDRINE (ZYRTEC-D) 5-120 MG PER TABLET    One twice daily to help congestion   CITALOPRAM (CELEXA) 20 MG TABLET    One daily to help mood.   COMBIVENT RESPIMAT 20-100 MCG/ACT AERS RESPIMAT    INHALE 1 PUFF BY MOUTH EVERY 6 HOURS AS NEEDED FOR SHORTNESS OF BREATH   CYCLOBENZAPRINE (FLEXERIL) 5 MG TABLET    One at bed to relax muscles   DEXTROMETHORPHAN-GUAIFENESIN (MUCINEX DM) 30-600 MG PER 12 HR TABLET    One every 12 hours to help congestion and to suppress cough   DILTIAZEM (CARDIZEM CD) 240 MG 24 HR CAPSULE    TAKE 1 CAPSULE BY MOUTH EVERY DAY FOR BLOOD PRESSURE   FLUTICASONE (FLONASE) 50 MCG/ACT NASAL SPRAY    INSTILL 2 SPRAYS INTO BOTH NOSTRILS  TWICE DAILY   GLUCOSE BLOOD TEST STRIP    Check blood sugar once daily  DX: 250.00   HYDROCODONE-ACETAMINOPHEN (NORCO) 7.5-325 MG PER TABLET    Take 1 tablet by mouth every 6 hours as needed for severe pain   LIDOCAINE (LIDODERM) 5 %    Place 1 patch onto the skin daily as  needed. For shoulder pain.Remove & Discard patch within 12 hours or as directed by MD.   LOSARTAN (COZAAR) 50 MG TABLET    TAKE 1 TABLET BY MOUTH TWICE DAILY   METFORMIN (GLUCOPHAGE) 500 MG TABLET    TAKE 1 TABLET BY MOUTH 2 TIMES A DAY   METOCLOPRAMIDE (REGLAN) 5 MG TABLET    TAKE 1 TABLET BY MOUTH 4 TIMES A DAY   MULTIPLE VITAMINS-MINERALS (CENTRUM SILVER PO)    Take 1 tablet by mouth daily.   NON FORMULARY    Place 3 L into the nose daily. Oxygen   POTASSIUM CHLORIDE (K-DUR,KLOR-CON) 10 MEQ TABLET    TAKE 1 TABLET BY MOUTH EVERY DAY   RANITIDINE (ZANTAC) 300 MG TABLET    TAKE 1 TABLET BY MOUTH EVERY NIGHT AT BEDTIME   SENNA (SENOKOT) 8.6 MG TABS TABLET    Take 1 tablet (8.6 mg total) by mouth at bedtime and may repeat dose one time if  needed. For constipation.   SIMETHICONE (GAS-X EXTRA STRENGTH PO)    Take 1 tablet by mouth at bedtime as needed. As needed for gas.   TEMAZEPAM (RESTORIL) 30 MG CAPSULE    Take one tablet by mouth at bedtime to help rest   TRAMADOL (ULTRAM) 50 MG TABLET    TAKE 1 TABLET BY MOUTH THREE TIMES A DAY AS NEEDED FOR PAIN   TRIAMCINOLONE OINTMENT (KENALOG) 0.1 %    APPLY TOPICALLY TWICE DAILY   TRIAMTERENE-HYDROCHLOROTHIAZIDE (MAXZIDE-25) 37.5-25 MG PER TABLET    TAKE 1 TABLET BY MOUTH EVERY DAY TO CONTROL BLOOD PRESSURE   UMECLIDINIUM-VILANTEROL (ANORO ELLIPTA) 62.5-25 MCG/INH AEPB    Inhale into the lungs daily.  Modified Medications   No medications on file  Discontinued Medications   LOSARTAN (COZAAR) 50 MG TABLET    Take one tablet by mouth twice daily   METFORMIN (GLUCOPHAGE) 500 MG TABLET    One each morning to control diabetes   PREDNISONE (DELTASONE) 50 MG TABLET    Take 50mg  13 hours, 7 hours, and 1 hour before CT scan.     Review of Systems  Constitutional: Positive for activity change. Negative for fever and chills.  HENT: Positive for ear pain (right side), sinus pressure, sore throat and trouble swallowing. Negative for congestion.   Eyes: Negative.   Respiratory: Positive for cough and shortness of breath. Negative for choking, chest tightness, wheezing and stridor.   Cardiovascular: Negative for chest pain and palpitations.  Gastrointestinal: Positive for constipation. Negative for nausea, abdominal pain, diarrhea and abdominal distention.       Reflux frequently  Musculoskeletal: Positive for myalgias, back pain, arthralgias, gait problem and neck pain.  Skin: Negative.   Neurological: Positive for dizziness, weakness (generalized), light-headedness and headaches. Negative for tremors and syncope.  Hematological: Negative.   Psychiatric/Behavioral: Positive for sleep disturbance and dysphoric mood. Negative for hallucinations. The patient is not nervous/anxious and is not  hyperactive.     Filed Vitals:   03/12/14 1609  BP: 130/58  Pulse: 94  Temp: 98.2 F (36.8 C)  TempSrc: Oral  Resp: 18  Height: 5\' 6"  (1.676 m)  Weight: 179 lb 9.6 oz (81.466 kg)  SpO2: 95%   Body mass index is 29 kg/(m^2).  Physical Exam  Constitutional: She is oriented to person, place, and time.  overweight  HENT:  Nose: Nose normal.  Mouth/Throat: Oropharynx is clear and moist. No oropharyngeal exudate.  Tympanic membranes have a pink blush on both sides.  Eyes: Conjunctivae and  EOM are normal. Pupils are equal, round, and reactive to light.  Neck: No JVD present. No tracheal deviation present. No thyromegaly present.  Cardiovascular: Normal rate, regular rhythm and normal heart sounds.  Exam reveals no gallop and no friction rub.   No murmur heard. Pulmonary/Chest: No respiratory distress. She has no wheezes. She has no rales. She exhibits no tenderness.  Abdominal: She exhibits no distension and no mass. There is no tenderness.  Musculoskeletal: Normal range of motion. She exhibits edema and tenderness.  Lymphadenopathy:    She has no cervical adenopathy.  Neurological: She is oriented to person, place, and time. No cranial nerve deficit. Coordination normal.  Skin: No rash noted. No erythema. No pallor.  Psychiatric: Her behavior is normal. Judgment and thought content normal.     Labs reviewed: Appointment on 01/25/2014  Component Date Value Ref Range Status  . Sodium 01/25/2014 139  136 - 145 mEq/L Final  . Potassium 01/25/2014 4.1  3.5 - 5.1 mEq/L Final  . Chloride 01/25/2014 99  98 - 109 mEq/L Final  . CO2 01/25/2014 25  22 - 29 mEq/L Final  . Glucose 01/25/2014 157* 70 - 140 mg/dl Final  . BUN 01/25/2014 25.3  7.0 - 26.0 mg/dL Final  . Creatinine 01/25/2014 1.3* 0.6 - 1.1 mg/dL Final  . Total Bilirubin 01/25/2014 0.28  0.20 - 1.20 mg/dL Final  . Alkaline Phosphatase 01/25/2014 97  40 - 150 U/L Final  . AST 01/25/2014 16  5 - 34 U/L Final  . ALT  01/25/2014 16  0 - 55 U/L Final  . Total Protein 01/25/2014 7.1  6.4 - 8.3 g/dL Final  . Albumin 01/25/2014 3.9  3.5 - 5.0 g/dL Final  . Calcium 01/25/2014 11.1* 8.4 - 10.4 mg/dL Final  . Anion Gap 01/25/2014 15* 3 - 11 mEq/L Final  . WBC 01/25/2014 9.1  3.9 - 10.3 10e3/uL Final  . NEUT# 01/25/2014 7.9* 1.5 - 6.5 10e3/uL Final  . HGB 01/25/2014 11.8  11.6 - 15.9 g/dL Final  . HCT 01/25/2014 34.7* 34.8 - 46.6 % Final  . Platelets 01/25/2014 245  145 - 400 10e3/uL Final  . MCV 01/25/2014 88.3  79.5 - 101.0 fL Final  . MCH 01/25/2014 30.0  25.1 - 34.0 pg Final  . MCHC 01/25/2014 34.0  31.5 - 36.0 g/dL Final  . RBC 01/25/2014 3.93  3.70 - 5.45 10e6/uL Final  . RDW 01/25/2014 12.8  11.2 - 14.5 % Final  . lymph# 01/25/2014 1.2  0.9 - 3.3 10e3/uL Final  . MONO# 01/25/2014 0.1  0.1 - 0.9 10e3/uL Final  . Eosinophils Absolute 01/25/2014 0.0  0.0 - 0.5 10e3/uL Final  . Basophils Absolute 01/25/2014 0.0  0.0 - 0.1 10e3/uL Final  . NEUT% 01/25/2014 86.5* 38.4 - 76.8 % Final  . LYMPH% 01/25/2014 12.7* 14.0 - 49.7 % Final  . MONO% 01/25/2014 0.8  0.0 - 14.0 % Final  . EOS% 01/25/2014 0.0  0.0 - 7.0 % Final  . BASO% 01/25/2014 0.0  0.0 - 2.0 % Final  . nRBC 01/25/2014 0  0 - 0 % Final  Appointment on 12/18/2013  Component Date Value Ref Range Status  . Glucose 12/18/2013 123* 65 - 99 mg/dL Final  . BUN 12/18/2013 12  8 - 27 mg/dL Final  . Creatinine, Ser 12/18/2013 1.28* 0.57 - 1.00 mg/dL Final  . GFR calc non Af Amer 12/18/2013 43* >59 mL/min/1.73 Final  . GFR calc Af Amer 12/18/2013 49* >59 mL/min/1.73 Final  .  BUN/Creatinine Ratio 12/18/2013 9* 11 - 26 Final  . Sodium 12/18/2013 143  134 - 144 mmol/L Final  . Potassium 12/18/2013 4.4  3.5 - 5.2 mmol/L Final  . Chloride 12/18/2013 98  97 - 108 mmol/L Final  . CO2 12/18/2013 26  18 - 29 mmol/L Final  . Calcium 12/18/2013 9.7  8.7 - 10.3 mg/dL Final  . Total Protein 12/18/2013 6.1  6.0 - 8.5 g/dL Final  . Albumin 12/18/2013 4.1  3.6 - 4.8  g/dL Final  . Globulin, Total 12/18/2013 2.0  1.5 - 4.5 g/dL Final  . Albumin/Globulin Ratio 12/18/2013 2.1  1.1 - 2.5 Final  . Total Bilirubin 12/18/2013 0.2  0.0 - 1.2 mg/dL Final  . Alkaline Phosphatase 12/18/2013 96  39 - 117 IU/L Final  . AST 12/18/2013 19  0 - 40 IU/L Final  . ALT 12/18/2013 10  0 - 32 IU/L Final  . Hgb A1c MFr Bld 12/18/2013 5.7* 4.8 - 5.6 % Final   Comment:          Increased risk for diabetes: 5.7 - 6.4                                   Diabetes: >6.4                                   Glycemic control for adults with diabetes: <7.0  . Est. average glucose Bld gHb Est-m* 12/18/2013 117   Final  . Cholesterol, Total 12/18/2013 207* 100 - 199 mg/dL Final  . Triglycerides 12/18/2013 168* 0 - 149 mg/dL Final  . HDL 12/18/2013 63  >39 mg/dL Final   Comment: According to ATP-III Guidelines, HDL-C >59 mg/dL is considered a                          negative risk factor for CHD.  Marland Kitchen VLDL Cholesterol Cal 12/18/2013 34  5 - 40 mg/dL Final  . LDL Calculated 12/18/2013 110* 0 - 99 mg/dL Final  . Chol/HDL Ratio 12/18/2013 3.3  0.0 - 4.4 ratio units Final   Comment:                                   T. Chol/HDL Ratio                                                                      Men  Women                                                        1/2 Avg.Risk  3.4    3.3  Avg.Risk  5.0    4.4                                                         2X Avg.Risk  9.6    7.1                                                         3X Avg.Risk 23.4   11.0  . Microalbum.,U,Random 12/18/2013 10.8  0.0 - 17.0 ug/mL Final   Ct Chest W Contrast  01/25/2014   CLINICAL DATA:  Lung cancer diagnosed on the left in 1986 and on the right in 2012. Left lobectomy. Right-sided radiation therapy and chemotherapy COPD. Emphysema.Non-small cell lung cancer C34.90 (ICD-10-CM).  EXAM: CT CHEST WITH CONTRAST  TECHNIQUE: Multidetector CT  imaging of the chest was performed during intravenous contrast administration.  CONTRAST:  31mL OMNIPAQUE IOHEXOL 300 MG/ML  SOLN  COMPARISON:  07/24/2013  FINDINGS: Lungs/Pleura: Moderate centrilobular emphysema. Similar endobronchial narrowing including at the the right upper lobe segmental and right lower lobe subsegmental branches. left upper lobectomy.  Ground-glass opacity measures 6.1 x 3.8 cm on image 28 versus 5.9 x 3.3 cm on the prior exam (when remeasured). 2.5 cm craniocaudal today versus similar on the prior. 5.5 x 3.4 cm back on 01/22/2013.  Similar configuration of volume loss in the right infrahilar and paramediastinal region. No well-defined locally recurrent disease.  Similar right pleural thickening.  Heart/Mediastinum: No supraclavicular adenopathy. Normal heart size, without pericardial effusion. Extensive aortic atherosclerosis. Multivessel coronary artery atherosclerosis. No central pulmonary embolism, on this non-dedicated study. No mediastinal or hilar adenopathy. Mildly dilated, fluid-filled esophagus on image 28. Similar small prevascular nodes, without adenopathy.  Upper Abdomen:  Mild hepatic steatosis.  Cholelithiasis.  Normal imaged portions of the spleen, stomach, pancreas, kidneys. Mild bilateral adrenal thickening and nodularity, unchanged.  Bones/Musculoskeletal: Bilateral humeral head avascular necrosis, similar. Healing posterior right fifth rib fracture, including on image 21 of series 2. There is subtle underlying heterogeneous density and possible heterogeneous density involving the posterior right third rib. Example image 14. There is overall similar. Vertebral augmentation at T4 and T5.  IMPRESSION: 1. Surgical changes of left upper lobectomy with presumed radiation changes in the right hemi thorax. No evidence of locally recurrent or extra osseous metastatic disease. 2. Suspect slight interval enlargement of a ground-glass opacity within the remaining right lung (the right  upper or right middle lobe). Cannot exclude indolent neoplasm such as low-grade adenocarcinoma. Recommend attention on follow-up. 3. Cholelithiasis. 4. Healing fifth right rib fracture. Suggestion of heterogeneous density within this rib and possibly the right posterior third rib. Correlate with trauma in these areas. Alternatively, this could be pathologic and related to radiation therapy or osseous metastasis. Of note, this appears new since the 01/2012/14 exam. 5. Osteopenia with compression deformities at T4 and T5, status post vertebral augmentation. 6. Bilateral humeral head avascular necrosis.   Electronically Signed   By: Abigail Miyamoto M.D.   On: 01/25/2014 17:02    Assessment/Plan  1. Non-small cell lung cancer, right See x-ray report above. Continues under care of oncology and pulmonary medicine.  2. Abdominal  pain, epigastric General improvement occasional discomfort  3. Anemia, unspecified anemia type Improved - CBC with Differential  4. Chronic obstructive pulmonary disease, unspecified COPD, unspecified chronic bronchitis type Contributes to persistent cough  5. Chronic cough Continue medications  6. Dysphagia Observe  7. Insomnia - ALPRAZolam (XANAX) 1 MG tablet; One by mouth up to 3 times daily for anxiety  Dispense: 90 tablet; Refill: 1  8. DM type 2 with diabetic peripheral neuropathy Controlled - CMP  9. Gastroesophageal reflux disease with esophagitis I contribute to the cough - ranitidine (ZANTAC) 300 MG tablet; Take 1 tablet (300 mg total) by mouth at bedtime.  Dispense: 30 tablet; Refill: 5  10. Non-small cell lung cancer, left See x-ray reports above

## 2014-03-13 LAB — CBC WITH DIFFERENTIAL/PLATELET
BASOS: 0 %
Basophils Absolute: 0 10*3/uL (ref 0.0–0.2)
Eos: 2 %
Eosinophils Absolute: 0.2 10*3/uL (ref 0.0–0.4)
HCT: 33.2 % — ABNORMAL LOW (ref 34.0–46.6)
Hemoglobin: 11 g/dL — ABNORMAL LOW (ref 11.1–15.9)
IMMATURE GRANS (ABS): 0 10*3/uL (ref 0.0–0.1)
Immature Granulocytes: 0 %
LYMPHS: 24 %
Lymphocytes Absolute: 2.3 10*3/uL (ref 0.7–3.1)
MCH: 29.7 pg (ref 26.6–33.0)
MCHC: 33.1 g/dL (ref 31.5–35.7)
MCV: 90 fL (ref 79–97)
Monocytes Absolute: 0.8 10*3/uL (ref 0.1–0.9)
Monocytes: 8 %
NEUTROS PCT: 66 %
Neutrophils Absolute: 6 10*3/uL (ref 1.4–7.0)
RBC: 3.7 x10E6/uL — AB (ref 3.77–5.28)
RDW: 14.2 % (ref 12.3–15.4)
WBC: 9.3 10*3/uL (ref 3.4–10.8)

## 2014-03-13 LAB — COMPREHENSIVE METABOLIC PANEL
ALK PHOS: 86 IU/L (ref 39–117)
ALT: 15 IU/L (ref 0–32)
AST: 24 IU/L (ref 0–40)
Albumin/Globulin Ratio: 2.2 (ref 1.1–2.5)
Albumin: 4.3 g/dL (ref 3.5–4.8)
BUN / CREAT RATIO: 14 (ref 11–26)
BUN: 15 mg/dL (ref 8–27)
CHLORIDE: 97 mmol/L (ref 97–108)
CO2: 27 mmol/L (ref 18–29)
Calcium: 10.5 mg/dL — ABNORMAL HIGH (ref 8.7–10.3)
Creatinine, Ser: 1.1 mg/dL — ABNORMAL HIGH (ref 0.57–1.00)
GFR calc Af Amer: 59 mL/min/{1.73_m2} — ABNORMAL LOW (ref >59)
GFR calc non Af Amer: 51 mL/min/{1.73_m2} — ABNORMAL LOW (ref >59)
Globulin, Total: 2 g/dL (ref 1.5–4.5)
Glucose: 98 mg/dL (ref 65–99)
Potassium: 3.9 mmol/L (ref 3.5–5.2)
SODIUM: 142 mmol/L (ref 134–144)
Total Bilirubin: 0.3 mg/dL (ref 0.0–1.2)
Total Protein: 6.3 g/dL (ref 6.0–8.5)

## 2014-03-14 ENCOUNTER — Other Ambulatory Visit: Payer: Self-pay | Admitting: *Deleted

## 2014-03-14 DIAGNOSIS — C3490 Malignant neoplasm of unspecified part of unspecified bronchus or lung: Secondary | ICD-10-CM

## 2014-03-28 ENCOUNTER — Other Ambulatory Visit: Payer: Self-pay | Admitting: Emergency Medicine

## 2014-03-28 ENCOUNTER — Other Ambulatory Visit: Payer: Self-pay | Admitting: Internal Medicine

## 2014-03-28 DIAGNOSIS — M545 Low back pain, unspecified: Secondary | ICD-10-CM

## 2014-03-28 MED ORDER — HYDROCODONE-ACETAMINOPHEN 7.5-325 MG PO TABS: ORAL_TABLET | ORAL | Status: DC

## 2014-03-28 NOTE — Telephone Encounter (Signed)
Patient requested and will pick up 

## 2014-04-02 ENCOUNTER — Other Ambulatory Visit: Payer: Self-pay | Admitting: Internal Medicine

## 2014-04-09 ENCOUNTER — Telehealth: Payer: Self-pay | Admitting: *Deleted

## 2014-04-09 NOTE — Telephone Encounter (Signed)
Patient called having ear Pain and drainage. I offered patient an appointment and she refused and stated that she will take care of it herself for now and call us later if she needs [place].

## 2014-04-25 ENCOUNTER — Other Ambulatory Visit: Payer: Self-pay | Admitting: *Deleted

## 2014-04-25 DIAGNOSIS — M545 Low back pain, unspecified: Secondary | ICD-10-CM

## 2014-04-25 MED ORDER — HYDROCODONE-ACETAMINOPHEN 7.5-325 MG PO TABS: ORAL_TABLET | ORAL | Status: DC

## 2014-04-25 NOTE — Telephone Encounter (Signed)
Patient Requested and will pick up 

## 2014-04-29 ENCOUNTER — Other Ambulatory Visit: Payer: Self-pay | Admitting: Internal Medicine

## 2014-05-07 ENCOUNTER — Other Ambulatory Visit: Payer: Self-pay | Admitting: Internal Medicine

## 2014-05-13 ENCOUNTER — Other Ambulatory Visit: Payer: Self-pay | Admitting: *Deleted

## 2014-05-13 MED ORDER — ALPRAZOLAM 1 MG PO TABS: ORAL_TABLET | ORAL | Status: DC

## 2014-05-13 NOTE — Telephone Encounter (Signed)
Patient requested and phy. Pharmacy called and stated that she is due. Printed and faxed.

## 2014-05-21 ENCOUNTER — Encounter: Payer: Self-pay | Admitting: Internal Medicine

## 2014-05-21 ENCOUNTER — Ambulatory Visit (INDEPENDENT_AMBULATORY_CARE_PROVIDER_SITE_OTHER): Payer: Medicare Other | Admitting: Internal Medicine

## 2014-05-21 VITALS — BP 120/50 | HR 93 | Temp 97.9°F | Resp 20 | Ht 66.0 in | Wt 168.0 lb

## 2014-05-21 DIAGNOSIS — M545 Low back pain, unspecified: Secondary | ICD-10-CM

## 2014-05-21 DIAGNOSIS — J320 Chronic maxillary sinusitis: Secondary | ICD-10-CM | POA: Diagnosis not present

## 2014-05-21 DIAGNOSIS — R053 Chronic cough: Secondary | ICD-10-CM

## 2014-05-21 DIAGNOSIS — R05 Cough: Secondary | ICD-10-CM | POA: Diagnosis not present

## 2014-05-21 DIAGNOSIS — J449 Chronic obstructive pulmonary disease, unspecified: Secondary | ICD-10-CM

## 2014-05-21 DIAGNOSIS — R1310 Dysphagia, unspecified: Secondary | ICD-10-CM | POA: Diagnosis not present

## 2014-05-21 DIAGNOSIS — J9611 Chronic respiratory failure with hypoxia: Secondary | ICD-10-CM | POA: Diagnosis not present

## 2014-05-21 DIAGNOSIS — Z9181 History of falling: Secondary | ICD-10-CM | POA: Diagnosis not present

## 2014-05-21 MED ORDER — AMOXICILLIN-POT CLAVULANATE 875-125 MG PO TABS: ORAL_TABLET | ORAL | Status: DC

## 2014-05-21 MED ORDER — HYDROCODONE-ACETAMINOPHEN 7.5-325 MG PO TABS: ORAL_TABLET | ORAL | Status: DC

## 2014-05-21 NOTE — Progress Notes (Signed)
Patient ID: [name], female   DOB: 08-17-1945, 69 y.o.   MRN: [id number]    Facility  [facility]    Place of Service:   OFFICE   Allergies  Allergen Reactions  . Iohexol Shortness Of Breath and Other (See Comments)    "burns me up inside"    Pt does fine with 13 hour prep  01/17/12  . Other     CAN'T STAND THE SMELL OF PURE BLEACH; "HAVE TO BACK UP AND POUR AND DON'T BREATH IT TIL i GET CAP BACK ON; DILUTE IT TO SPRAY"  . Codeine Itching and Nausea And Vomiting    REACTION: GI upset  . Anoro Ellipta [Umeclidinium-Vilanterol]     Swelling of lips and mouth  . Protonix [Pantoprazole Sodium] Diarrhea    Chief Complaint  Patient presents with  . Acute Visit    sinus infection, headaches  . Medical Management of Chronic Issues    needs toilet riser, shower rail,     HPI:  Started with earache. Drainage from sinuses and nose. Produced Medardo Hassing, beige, yellow, and blood in drainage. Pain in the ears, temples, and side of the head. Sweating and cold at night.   Still with headaches. Pains would last about 6-7 seconds.  Lost 7 pounds in the last 2 months. Says she is eating at least twice daily.  Fell in the [place] and hit the toilet. Got bruised in the back.  Midline low back pain without sciatica - Plan: using HYDROcodone-acetaminophen (NORCO) 7.5-325 MG per tablet  Chronic maxillary sinusitis -wants antibiotic  Chronic obstructive pulmonary disease, unspecified COPD, unspecified chronic bronchitis type: Unchanged  Chronic respiratory failure with hypoxia: Continues on oxygen 24/7  Chronic cough - persistent cough felt associated with chronic maxillary sinusitis and probable chronic bronchitis.   Dysphagia: Continues with choking at mealtimes  History of fall: Recent fall and injury to her back with a contusion.    Medications: Patient's Medications  New Prescriptions   No medications on file  Previous Medications   ALBUTEROL-IPRATROPIUM (COMBIVENT) 18-103 MCG/ACT INHALER     every 4 (four) hours as needed. 2 puffs every for hours for lung cancer   ALPRAZOLAM (XANAX) 1 MG TABLET    Take one tablet by mouth three times daily as needed for anxiety   AMBULATORY NON FORMULARY MEDICATION    Lighter Weight Rollator with Padded Seat and Hand Brakes Dx: 496, 729.1, 722.52   ASPIRIN EC 325 MG TABLET    Take 325 mg by mouth daily.   BUDESONIDE-FORMOTEROL (SYMBICORT) 160-4.5 MCG/ACT INHALER    Inhale 2 puffs into the lungs every 12 (twelve) hours.   CALCIUM CITRATE-VITAMIN D (CITRACAL + D PO)    Take 2 tablets by mouth daily.   CETIRIZINE-PSEUDOEPHEDRINE (ZYRTEC-D) 5-120 MG PER TABLET    One twice daily to help congestion   CITALOPRAM (CELEXA) 20 MG TABLET    One daily to help mood.   COMBIVENT RESPIMAT 20-100 MCG/ACT AERS RESPIMAT    INHALE 1 PUFF BY MOUTH EVERY 6 HOURS AS NEEDED FOR SHORTNESS OF BREATH   CYCLOBENZAPRINE (FLEXERIL) 5 MG TABLET    One at bed to relax muscles   DEXTROMETHORPHAN-GUAIFENESIN (MUCINEX DM) 30-600 MG PER 12 HR TABLET    One every 12 hours to help congestion and to suppress cough   DILTIAZEM (CARDIZEM CD) 240 MG 24 HR CAPSULE    TAKE 1 CAPSULE BY MOUTH EVERY DAY FOR BLOOD PRESSURE   FLUTICASONE (FLONASE) 50 MCG/ACT NASAL SPRAY  INSTILL 2 SPRAYS INTO BOTH NOSTRILS  TWICE DAILY   GLUCOSE BLOOD TEST STRIP    Check blood sugar once daily  DX: 250.00   LIDOCAINE (LIDODERM) 5 %    Place 1 patch onto the skin daily as needed. For shoulder pain.Remove & Discard patch within 12 hours or as directed by MD.   LOSARTAN (COZAAR) 50 MG TABLET    TAKE 1 TABLET BY MOUTH TWICE DAILY   METFORMIN (GLUCOPHAGE) 500 MG TABLET    TAKE 1 TABLET BY MOUTH 2 TIMES A DAY   METOCLOPRAMIDE (REGLAN) 5 MG TABLET    TAKE 1 TABLET BY MOUTH 4 TIMES A DAY   MULTIPLE VITAMINS-MINERALS (CENTRUM SILVER PO)    Take 1 tablet by mouth daily.   NON FORMULARY    Place 3 L into the nose daily. Oxygen   POTASSIUM CHLORIDE (K-DUR,KLOR-CON) 10 MEQ TABLET    TAKE 1 TABLET BY MOUTH EVERY DAY    RANITIDINE (ZANTAC) 300 MG TABLET    Take 1 tablet (300 mg total) by mouth at bedtime.   SENNA (SENOKOT) 8.6 MG TABS TABLET    Take 1 tablet (8.6 mg total) by mouth at bedtime and may repeat dose one time if needed. For constipation.   SIMETHICONE (GAS-X EXTRA STRENGTH PO)    Take 1 tablet by mouth at bedtime as needed. As needed for gas.   TRAMADOL (ULTRAM) 50 MG TABLET    TAKE 1 TABLET BY MOUTH THREE TIMES A DAY AS NEEDED FOR PAIN   TRIAMCINOLONE OINTMENT (KENALOG) 0.1 %    APPLY TOPICALLY TWICE DAILY   TRIAMTERENE-HYDROCHLOROTHIAZIDE (MAXZIDE-25) 37.5-25 MG PER TABLET    TAKE 1 TABLET BY MOUTH EVERY DAY TO CONTROL BLOOD PRESSURE   UMECLIDINIUM-VILANTEROL (ANORO ELLIPTA) 62.5-25 MCG/INH AEPB    Inhale into the lungs daily.  Modified Medications   Modified Medication Previous Medication   HYDROCODONE-ACETAMINOPHEN (NORCO) 7.5-325 MG PER TABLET HYDROcodone-acetaminophen (NORCO) 7.5-325 MG per tablet      Take 1 tablet by mouth every 6 hours as needed for severe pain    Take 1 tablet by mouth every 6 hours as needed for severe pain  Discontinued Medications   METFORMIN (GLUCOPHAGE) 500 MG TABLET    Take one tablet by mouth twice daily to control blood sugar     Review of Systems  Constitutional: Positive for activity change. Negative for fever and chills.  HENT: Positive for ear pain (right side), sinus pressure, sore throat and trouble swallowing. Negative for congestion.   Eyes: Negative.   Respiratory: Positive for cough and shortness of breath. Negative for choking, chest tightness, wheezing and stridor.   Cardiovascular: Negative for chest pain and palpitations.  Gastrointestinal: Positive for constipation. Negative for nausea, abdominal pain, diarrhea and abdominal distention.       Reflux frequently  Musculoskeletal: Positive for myalgias, back pain, arthralgias, gait problem and neck pain.  Skin: Negative.   Neurological: Positive for dizziness, weakness (generalized),  light-headedness and headaches. Negative for tremors and syncope.  Hematological: Negative.   Psychiatric/Behavioral: Positive for sleep disturbance and dysphoric mood. Negative for hallucinations. The patient is not nervous/anxious and is not hyperactive.     Filed Vitals:   05/21/14 1539  BP: 120/50  Pulse: 93  Temp: 97.9 F (36.6 C)  TempSrc: Oral  Resp: 20  Height: 5\' 6"  (1.676 m)  Weight: 168 lb (76.204 kg)  SpO2: 96%   Body mass index is 27.13 kg/(m^2).  Physical Exam  Constitutional: She is oriented to  person, place, and time.  overweight  HENT:  Nose: Nose normal.  Mouth/Throat: Oropharynx is clear and moist. No oropharyngeal exudate.  Loss of hearing.  Eyes: Conjunctivae and EOM are normal. Pupils are equal, round, and reactive to light.  Neck: No JVD present. No tracheal deviation present. No thyromegaly present.  Cardiovascular: Normal rate, regular rhythm and normal heart sounds.  Exam reveals no gallop and no friction rub.   No murmur heard. Pulmonary/Chest: No respiratory distress. She has no wheezes. She has no rales. She exhibits no tenderness.  Abdominal: She exhibits no distension and no mass. There is no tenderness.  Musculoskeletal: Normal range of motion. She exhibits edema and tenderness.  Lymphadenopathy:    She has no cervical adenopathy.  Neurological: She is oriented to person, place, and time. No cranial nerve deficit. Coordination normal.  Skin: No rash noted. No erythema. No pallor.  Psychiatric: Her behavior is normal. Judgment and thought content normal.     Labs reviewed: Office Visit on 03/12/2014  Component Date Value Ref Range Status  . Glucose 03/12/2014 98  65 - 99 mg/dL Final  . BUN 03/12/2014 15  8 - 27 mg/dL Final  . Creatinine, Ser 03/12/2014 1.10* 0.57 - 1.00 mg/dL Final  . GFR calc non Af Amer 03/12/2014 51* >59 mL/min/1.73 Final  . GFR calc Af Amer 03/12/2014 59* >59 mL/min/1.73 Final  . BUN/Creatinine Ratio 03/12/2014 14   11 - 26 Final  . Sodium 03/12/2014 142  134 - 144 mmol/L Final  . Potassium 03/12/2014 3.9  3.5 - 5.2 mmol/L Final  . Chloride 03/12/2014 97  97 - 108 mmol/L Final  . CO2 03/12/2014 27  18 - 29 mmol/L Final  . Calcium 03/12/2014 10.5* 8.7 - 10.3 mg/dL Final  . Total Protein 03/12/2014 6.3  6.0 - 8.5 g/dL Final  . Albumin 03/12/2014 4.3  3.5 - 4.8 g/dL Final  . Globulin, Total 03/12/2014 2.0  1.5 - 4.5 g/dL Final  . Albumin/Globulin Ratio 03/12/2014 2.2  1.1 - 2.5 Final  . Total Bilirubin 03/12/2014 0.3  0.0 - 1.2 mg/dL Final  . Alkaline Phosphatase 03/12/2014 86  39 - 117 IU/L Final  . AST 03/12/2014 24  0 - 40 IU/L Final  . ALT 03/12/2014 15  0 - 32 IU/L Final  . WBC 03/12/2014 9.3  3.4 - 10.8 x10E3/uL Final  . RBC 03/12/2014 3.70* 3.77 - 5.28 x10E6/uL Final  . Hemoglobin 03/12/2014 11.0* 11.1 - 15.9 g/dL Final  . HCT 03/12/2014 33.2* 34.0 - 46.6 % Final  . MCV 03/12/2014 90  79 - 97 fL Final  . MCH 03/12/2014 29.7  26.6 - 33.0 pg Final  . MCHC 03/12/2014 33.1  31.5 - 35.7 g/dL Final  . RDW 03/12/2014 14.2  12.3 - 15.4 % Final  . Neutrophils Relative % 03/12/2014 66   Final  . Lymphs 03/12/2014 24   Final  . Monocytes 03/12/2014 8   Final  . Eos 03/12/2014 2   Final  . Basos 03/12/2014 0   Final  . Neutrophils Absolute 03/12/2014 6.0  1.4 - 7.0 x10E3/uL Final  . Lymphocytes Absolute 03/12/2014 2.3  0.7 - 3.1 x10E3/uL Final  . Monocytes Absolute 03/12/2014 0.8  0.1 - 0.9 x10E3/uL Final  . Eosinophils Absolute 03/12/2014 0.2  0.0 - 0.4 x10E3/uL Final  . Basophils Absolute 03/12/2014 0.0  0.0 - 0.2 x10E3/uL Final  . Immature Granulocytes 03/12/2014 0   Final  . Immature Grans (Abs) 03/12/2014 0.0  0.0 -  0.1 x10E3/uL Final     Assessment/Plan  1. Midline low back pain without sciatica - HYDROcodone-acetaminophen (NORCO) 7.5-325 MG per tablet; Take 1 tablet by mouth every 6 hours as needed for severe pain  Dispense: 120 tablet; Refill: 0  2. Chronic maxillary sinusitis -  amoxicillin-clavulanate (AUGMENTIN) 875-125 MG per tablet; One twice daily for infection  Dispense: 14 tablet; Refill: 0  3. Chronic obstructive pulmonary disease, unspecified COPD, unspecified chronic bronchitis type unchanged  4. Chronic respiratory failure with hypoxia Continue O2 supplementation  5. Chronic cough - amoxicillin-clavulanate (AUGMENTIN) 875-125 MG per tablet; One twice daily for infection  Dispense: 14 tablet; Refill: 0  6. Dysphagia obsereve  7. History of fall At risk for further falls. Recommended a shower chair and a toilet riser seat.

## 2014-05-29 ENCOUNTER — Telehealth: Payer: Self-pay

## 2014-05-29 MED ORDER — MAGIC MOUTHWASH: ORAL | Status: DC

## 2014-05-29 NOTE — Telephone Encounter (Signed)
Patient called in indicating she took antibiotic rx'ed by Dr.Green at last office visit (05/21/14). Patient now has a yeast infection in her mouth. Patient is requesting medication to [facility]  Patient states FYI- "My ears are still bothering me but I am not coming in for another appointment about it."

## 2014-05-29 NOTE — Telephone Encounter (Signed)
Patient aware rx sent in

## 2014-05-29 NOTE — Telephone Encounter (Signed)
Rx  Magic Mouthwash 200 cc.  Swish and swallow 5 cc 4 times daily

## 2014-05-31 ENCOUNTER — Other Ambulatory Visit: Payer: Self-pay | Admitting: Nurse Practitioner

## 2014-05-31 ENCOUNTER — Other Ambulatory Visit: Payer: Self-pay

## 2014-06-03 NOTE — Telephone Encounter (Signed)
Patient called and wanted to know does she have to swallow the Magic Mouthwash? It makes her nauseated. Please Advise.

## 2014-06-03 NOTE — Telephone Encounter (Signed)
It is better if she can swallow it due to yeast being in the esophagus sometimes. If it is too nauseating, she can swish in the mouth for 1-2 minutes, then spit it out.

## 2014-06-05 ENCOUNTER — Ambulatory Visit: Payer: Medicare Other | Admitting: Internal Medicine

## 2014-06-05 NOTE — Telephone Encounter (Signed)
Patient Notified and agreed. 

## 2014-06-12 ENCOUNTER — Telehealth: Payer: Self-pay

## 2014-06-12 NOTE — Telephone Encounter (Signed)
Message left on triage voicemail: Patient needs rx for pain medication to be available on Friday, patient's daughter will pick-up.  Patient is not due for pain medication until 06/19/14 (next Wednesday)  I tried to call patient x 2 to inform her of when medication is due, unable to make connection. I will try again on Thursday

## 2014-06-18 NOTE — Telephone Encounter (Signed)
Patient aware rx is not due for pick-up until tomorrow

## 2014-06-19 ENCOUNTER — Other Ambulatory Visit: Payer: Self-pay | Admitting: *Deleted

## 2014-06-19 DIAGNOSIS — M545 Low back pain, unspecified: Secondary | ICD-10-CM

## 2014-06-19 MED ORDER — HYDROCODONE-ACETAMINOPHEN 7.5-325 MG PO TABS: ORAL_TABLET | ORAL | Status: DC

## 2014-06-26 ENCOUNTER — Other Ambulatory Visit: Payer: Self-pay | Admitting: Nurse Practitioner

## 2014-06-28 ENCOUNTER — Other Ambulatory Visit: Payer: Self-pay | Admitting: Emergency Medicine

## 2014-06-28 ENCOUNTER — Other Ambulatory Visit: Payer: Self-pay | Admitting: Internal Medicine

## 2014-07-03 ENCOUNTER — Encounter: Payer: Medicare Other | Admitting: Cardiovascular Disease

## 2014-07-04 ENCOUNTER — Ambulatory Visit: Payer: Medicare Other | Admitting: Nurse Practitioner

## 2014-07-04 NOTE — Progress Notes (Signed)
This encounter was created in error - please disregard.  This encounter was created in error - please disregard.

## 2014-07-16 ENCOUNTER — Ambulatory Visit: Payer: Medicare Other | Admitting: Internal Medicine

## 2014-07-18 ENCOUNTER — Telehealth: Payer: Self-pay | Admitting: *Deleted

## 2014-07-18 DIAGNOSIS — D649 Anemia, unspecified: Secondary | ICD-10-CM

## 2014-07-18 MED ORDER — INTEGRA PLUS PO CAPS: 1.0000 | ORAL_CAPSULE | Freq: Every day | ORAL | Status: DC

## 2014-07-18 NOTE — Telephone Encounter (Signed)
Spoke with patient regarding having lab work, she stated that she would come in on Friday to get labs drawn.

## 2014-07-18 NOTE — Telephone Encounter (Addendum)
Patient called requesting medication to build her blood up, she stated that she is feeling tired and run-down. She wanted Dr. Nyoka Cowden to give her the medication that she used before which was Integra Plus. I spoke with Dr. Nyoka Cowden and he stated that she could use ferrous sulfate 325mg  if this medication was to expensive or didn't agree with her this time. I called the patient and LMOM that she needs to come in to get some labwork done before she starts on this medication.

## 2014-07-19 ENCOUNTER — Other Ambulatory Visit: Payer: Self-pay

## 2014-07-19 ENCOUNTER — Other Ambulatory Visit: Payer: Self-pay | Admitting: *Deleted

## 2014-07-19 DIAGNOSIS — M545 Low back pain, unspecified: Secondary | ICD-10-CM

## 2014-07-19 MED ORDER — HYDROCODONE-ACETAMINOPHEN 7.5-325 MG PO TABS: ORAL_TABLET | ORAL | Status: DC

## 2014-07-19 NOTE — Telephone Encounter (Signed)
Patient requested and will pick up 

## 2014-07-22 ENCOUNTER — Other Ambulatory Visit: Payer: Medicare Other

## 2014-07-23 ENCOUNTER — Other Ambulatory Visit: Payer: Medicare Other

## 2014-07-23 DIAGNOSIS — D649 Anemia, unspecified: Secondary | ICD-10-CM

## 2014-07-24 LAB — CBC WITH DIFFERENTIAL/PLATELET
BASOS ABS: 0 10*3/uL (ref 0.0–0.2)
Basos: 0 %
EOS ABS: 0.3 10*3/uL (ref 0.0–0.4)
Eos: 3 %
HEMATOCRIT: 35.7 % (ref 34.0–46.6)
Hemoglobin: 11.7 g/dL (ref 11.1–15.9)
Immature Grans (Abs): 0 10*3/uL (ref 0.0–0.1)
Immature Granulocytes: 0 %
LYMPHS ABS: 2.3 10*3/uL (ref 0.7–3.1)
Lymphs: 28 %
MCH: 29.9 pg (ref 26.6–33.0)
MCHC: 32.8 g/dL (ref 31.5–35.7)
MCV: 91 fL (ref 79–97)
Monocytes Absolute: 0.6 10*3/uL (ref 0.1–0.9)
Monocytes: 7 %
NEUTROS ABS: 4.9 10*3/uL (ref 1.4–7.0)
Neutrophils Relative %: 62 %
Platelets: 258 10*3/uL (ref 150–379)
RBC: 3.91 x10E6/uL (ref 3.77–5.28)
RDW: 13.6 % (ref 12.3–15.4)
WBC: 8.1 10*3/uL (ref 3.4–10.8)

## 2014-07-25 ENCOUNTER — Other Ambulatory Visit: Payer: Self-pay | Admitting: Nurse Practitioner

## 2014-07-25 ENCOUNTER — Other Ambulatory Visit: Payer: Self-pay | Admitting: *Deleted

## 2014-07-25 ENCOUNTER — Other Ambulatory Visit: Payer: Self-pay | Admitting: Internal Medicine

## 2014-07-25 DIAGNOSIS — C3490 Malignant neoplasm of unspecified part of unspecified bronchus or lung: Secondary | ICD-10-CM

## 2014-07-25 MED ORDER — PREDNISONE 50 MG PO TABS
ORAL_TABLET | ORAL | Status: DC
Start: 2014-07-25 — End: 2014-08-01

## 2014-07-25 MED ORDER — DIPHENHYDRAMINE HCL 50 MG PO TABS: 50.0000 mg | ORAL_TABLET | ORAL | Status: DC

## 2014-07-25 NOTE — Progress Notes (Signed)
Patient called at 1256 requesting refill on benadryl and prednisone  For CT scans on 07-29-2014 at 1:00 pm.  Reports allergy to dye with scans.  Orders found and re-ordered at this time.  Instructed to take prednisone at 12:00 am, 6 am and again at 12:00 pm.  benadryl will be taken at 12:00 pm.  Verbalized understanding.

## 2014-07-26 ENCOUNTER — Telehealth: Payer: Self-pay

## 2014-07-26 MED ORDER — TRAMADOL HCL 50 MG PO TABS: ORAL_TABLET | ORAL | Status: DC

## 2014-07-26 NOTE — Telephone Encounter (Signed)
Patient called about lab work done 07/23/14, CBC was all normal continue all the same.

## 2014-07-26 NOTE — Telephone Encounter (Signed)
Spoke with patient, patient states Dr.Green told her to restart Lopid after labs in September

## 2014-07-29 ENCOUNTER — Ambulatory Visit (HOSPITAL_COMMUNITY)
Admission: RE | Admit: 2014-07-29 | Discharge: 2014-07-29 | Disposition: A | Discharge status: Home / Self Care | Payer: Medicare Other | Source: Ambulatory Visit | Attending: Physician Assistant | Admitting: Physician Assistant

## 2014-07-29 ENCOUNTER — Other Ambulatory Visit (HOSPITAL_BASED_OUTPATIENT_CLINIC_OR_DEPARTMENT_OTHER): Payer: Medicare Other

## 2014-07-29 ENCOUNTER — Encounter (HOSPITAL_COMMUNITY): Payer: Self-pay

## 2014-07-29 ENCOUNTER — Other Ambulatory Visit: Payer: Self-pay

## 2014-07-29 DIAGNOSIS — Z923 Personal history of irradiation: Secondary | ICD-10-CM | POA: Diagnosis not present

## 2014-07-29 DIAGNOSIS — Z85118 Personal history of other malignant neoplasm of bronchus and lung: Secondary | ICD-10-CM

## 2014-07-29 DIAGNOSIS — C3492 Malignant neoplasm of unspecified part of left bronchus or lung: Secondary | ICD-10-CM | POA: Diagnosis not present

## 2014-07-29 DIAGNOSIS — C3490 Malignant neoplasm of unspecified part of unspecified bronchus or lung: Secondary | ICD-10-CM

## 2014-07-29 DIAGNOSIS — Z9889 Other specified postprocedural states: Secondary | ICD-10-CM | POA: Diagnosis not present

## 2014-07-29 DIAGNOSIS — Z87891 Personal history of nicotine dependence: Secondary | ICD-10-CM | POA: Insufficient documentation

## 2014-07-29 DIAGNOSIS — J90 Pleural effusion, not elsewhere classified: Secondary | ICD-10-CM | POA: Diagnosis not present

## 2014-07-29 LAB — CBC WITH DIFFERENTIAL/PLATELET
BASO%: 0.3 % (ref 0.0–2.0)
BASOS ABS: 0 10*3/uL (ref 0.0–0.1)
EOS%: 0 % (ref 0.0–7.0)
Eosinophils Absolute: 0 10*3/uL (ref 0.0–0.5)
HEMATOCRIT: 36.1 % (ref 34.8–46.6)
HEMOGLOBIN: 11.9 g/dL (ref 11.6–15.9)
LYMPH%: 11.6 % — AB (ref 14.0–49.7)
MCH: 29.5 pg (ref 25.1–34.0)
MCHC: 32.9 g/dL (ref 31.5–36.0)
MCV: 89.7 fL (ref 79.5–101.0)
MONO#: 0 10*3/uL — ABNORMAL LOW (ref 0.1–0.9)
MONO%: 0.4 % (ref 0.0–14.0)
NEUT%: 87.7 % — ABNORMAL HIGH (ref 38.4–76.8)
NEUTROS ABS: 7.5 10*3/uL — AB (ref 1.5–6.5)
PLATELETS: 267 10*3/uL (ref 145–400)
RBC: 4.03 10*6/uL (ref 3.70–5.45)
RDW: 13.1 % (ref 11.2–14.5)
WBC: 8.6 10*3/uL (ref 3.9–10.3)
lymph#: 1 10*3/uL (ref 0.9–3.3)

## 2014-07-29 LAB — COMPREHENSIVE METABOLIC PANEL (CC13)
ALBUMIN: 4 g/dL (ref 3.5–5.0)
ALT: 10 U/L (ref 0–55)
AST: 14 U/L (ref 5–34)
Alkaline Phosphatase: 83 U/L (ref 40–150)
Anion Gap: 17 mEq/L — ABNORMAL HIGH (ref 3–11)
BUN: 16.8 mg/dL (ref 7.0–26.0)
CALCIUM: 10 mg/dL (ref 8.4–10.4)
CHLORIDE: 101 meq/L (ref 98–109)
CO2: 22 mEq/L (ref 22–29)
CREATININE: 1.3 mg/dL — AB (ref 0.6–1.1)
EGFR: 41 mL/min/{1.73_m2} — ABNORMAL LOW (ref >90)
Glucose: 200 mg/dl — ABNORMAL HIGH (ref 70–140)
Potassium: 4.1 mEq/L (ref 3.5–5.1)
Sodium: 140 mEq/L (ref 136–145)
Total Bilirubin: 0.26 mg/dL (ref 0.20–1.20)
Total Protein: 7 g/dL (ref 6.4–8.3)

## 2014-07-29 MED ORDER — IOHEXOL 300 MG/ML  SOLN
80.0000 mL | Freq: Once | INTRAMUSCULAR | Status: AC | PRN
  Administered 2014-07-29: 80 mL via INTRAVENOUS

## 2014-07-29 MED ORDER — TRAMADOL HCL 50 MG PO TABS: ORAL_TABLET | ORAL | Status: DC

## 2014-07-30 ENCOUNTER — Telehealth: Payer: Self-pay | Admitting: *Deleted

## 2014-07-30 NOTE — Telephone Encounter (Signed)
Patient called and stated that she had a CT done on her lungs yesterday and they told her not to take her Diabetic medications for 2 days after the procedure. Patient has never been told not to do this before and wants to know why they told her this and if this is ok with you. Please Advise.

## 2014-07-31 ENCOUNTER — Ambulatory Visit (HOSPITAL_BASED_OUTPATIENT_CLINIC_OR_DEPARTMENT_OTHER): Payer: Medicare Other | Admitting: Internal Medicine

## 2014-07-31 ENCOUNTER — Encounter: Payer: Self-pay | Admitting: Internal Medicine

## 2014-07-31 ENCOUNTER — Telehealth: Payer: Self-pay | Admitting: Internal Medicine

## 2014-07-31 VITALS — BP 139/48 | HR 84 | Temp 98.0°F | Resp 18 | Ht 66.0 in | Wt 168.4 lb

## 2014-07-31 DIAGNOSIS — J984 Other disorders of lung: Secondary | ICD-10-CM | POA: Diagnosis not present

## 2014-07-31 DIAGNOSIS — Z85118 Personal history of other malignant neoplasm of bronchus and lung: Secondary | ICD-10-CM

## 2014-07-31 DIAGNOSIS — C3491 Malignant neoplasm of unspecified part of right bronchus or lung: Secondary | ICD-10-CM

## 2014-07-31 NOTE — Telephone Encounter (Signed)
She should leave off the metformin. Take all of her other usual medications.  Metformin, when used following a contrast procedure in radiology, has been associated with subsequent kidney failure.

## 2014-07-31 NOTE — Telephone Encounter (Signed)
Patient Notified and agreed. 

## 2014-07-31 NOTE — Telephone Encounter (Signed)
Gave and printed appt sched and avs for pt for April thru Aug

## 2014-07-31 NOTE — Progress Notes (Signed)
[facility] [phone]   Fax:([phone]) [place], [place], [place] [address]  DIAGNOSIS: Stage IIB/IIIA non-small cell lung cancer consistent with adenocarcinoma diagnosed in December 2012.   PRIOR THERAPY:  1) Status post radiation therapy to the right upper lobe lung mass completed on 06/04/2011 under the care of Dr. Pablo Ledger.  2) Systemic chemotherapy with carboplatin for AUC of 5 and Alimta 500 mg/M2 giving every 3 weeks, status post 3 cycles, last dose was given on 07/21/2011.   CURRENT THERAPY: Observation.  INTERVAL HISTORY: [name] 69 y.o. female returns to the clinic today for routine six month follow up visit. The patient is feeling fine today with no specific complaints except for the baseline shortness of breath and some musculoskeletal pain on the right side of the chest. She is currently on Vicodin by her primary care physician. She is currently on home oxygen. She denied having any significant weight loss or night sweats. She has no nausea or vomiting, no fever or chills. She had repeat CT scan of the chest performed recently and she is here for evaluation and discussion of her scan results.  MEDICAL HISTORY: Past Medical History  Diagnosis Date  . 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 Restless legs syndrome (RLS)   . Chronic sinusitis   . Hypertension   . Hyperlipidemia   . Anemia, unspecified   . GERD (gastroesophageal reflux disease)   . Respiratory failure with hypoxia 01/02/2010  . Complication of anesthesia     slow to wake up  . Asthma   . Coronary artery disease     Dr. Burt Knack had stress test done  . Type II or unspecified type diabetes mellitus without mention of complication, not stated as uncontrolled   . Peripheral vascular disease   . PAD (peripheral artery disease)   . Heart murmur   . Dysrhythmia     irregular  . Anginal pain   . History of blood clots      "2 in my aorta"  . Hard of hearing, right   . History of bronchitis   . Bronchial pneumonia     history  . COPD (chronic obstructive pulmonary disease)   . Emphysema   . Shortness of breath     "anytime really"  . Syncope and collapse   . Adenocarcinoma, lung     upper left lobe  . 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 Chemotherapy adverse reaction 09/15/11    "makes me light headed and pass out; this is 3rd blood transfusion since going thru it"  . History of blood transfusion   . Kidney infection     "related to chemo"  . Sinus headache     "all the time"  . Arthritis     "all over my body"  . Fibromyalgia   . Osteoporosis   . DDD (degenerative disc disease), lumbar   . Scoliosis   . Anxiety   . Mental disorder   . Reflux   . CHF (congestive heart failure)   . DVT (deep venous thrombosis)   . Hx of radiation therapy 04/28/11 -06/08/11    right upper lobe-lung  . Acute upper respiratory infections of unspecified site   . Pain in thoracic spine   . Asymptomatic varicose veins   . Functional urinary incontinence   . Diseases of lips   .  Other abnormal blood chemistry   . Effects of radiation, unspecified   . Anemia in neoplastic disease   . Muscle weakness (generalized)   . Dizziness and giddiness   . Spasm of muscle   . Unspecified vitamin D deficiency   . Hyperpotassemia   . Acute posthemorrhagic anemia   . Unspecified hearing loss   . Disorder of bone and cartilage, unspecified   . Screening for thyroid disorder   . Malignant neoplasm of bronchus and lung, unspecified site   . Hypopotassemia   . Atherosclerosis of native arteries of the extremities, unspecified   . Reflux esophagitis   . Unspecified constipation   . Type II or unspecified type diabetes mellitus with neurological manifestations, uncontrolled   . Unspecified hereditary and idiopathic peripheral neuropathy   . Urinary tract infection, site not specified   .  Muscle weakness (generalized)   . Acute sinusitis, unspecified   . Herpes zoster without mention of complication   . 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 Type II or unspecified type diabetes mellitus without mention of complication, not stated as uncontrolled   . Other and unspecified hyperlipidemia   . Anxiety state, unspecified   . Depressive disorder, not elsewhere classified   . Unspecified essential hypertension   . Atrial fibrillation   . Chronic airway obstruction, not elsewhere classified   . Osteoarthrosis, unspecified whether generalized or localized, unspecified site   . Lumbago     ALLERGIES:  is allergic to iohexol; other; anoro ellipta; codeine; and protonix.  MEDICATIONS:  Current Outpatient Prescriptions  Medication Sig Dispense Refill  . albuterol-ipratropium (COMBIVENT) 18-103 MCG/ACT inhaler every 4 (four) hours as needed. 2 puffs every for hours for lung cancer    . ALPRAZolam (XANAX) 1 MG tablet TAKE 1 TABLET BY MOUTH THREE TIMES A DAY AS NEEDED FOR ANXIETY 90 tablet 0  . AMBULATORY NON FORMULARY MEDICATION Lighter Weight Rollator with Padded Seat and Hand Brakes Dx: 496, 729.1, 722.52 1 each 0  . aspirin EC 325 MG tablet Take 325 mg by mouth daily.    . Calcium Citrate-Vitamin D (CITRACAL + D PO) Take 2 tablets by mouth daily.    . citalopram (CELEXA) 20 MG tablet TAKE 1 TABLET BY MOUTH EVERY DAY TO HELP MOOD 90 tablet 1  . diltiazem (CARDIZEM CD) 240 MG 24 hr capsule TAKE 1 CAPSULE BY MOUTH EVERY DAY FOR BLOOD PRESSURE 90 capsule 1  . diphenhydrAMINE (BENADRYL) 50 MG tablet Take 1 tablet (50 mg total) by mouth as directed. 1 hour before CT scan 1 tablet 0  . FeFum-FePoly-FA-B Cmp-C-Biot (INTEGRA PLUS) CAPS Take 1 capsule by mouth daily. 30 capsule 3  . fluticasone (FLONASE) 50 MCG/ACT nasal spray INSTILL 2 SPRAYS INTO BOTH NOSTRILS  TWICE DAILY 16 g 2  . gemfibrozil (LOPID) 600 MG tablet TAKE 1 TABLET BY MOUTH ONCE DAILY 30 tablet 5  . glucose blood test  strip Check blood sugar once daily  DX: 250.00    . HYDROcodone-acetaminophen (NORCO) 7.5-325 MG per tablet Take 1 tablet by mouth every 6 hours as needed for severe pain 120 tablet 0  . lidocaine (LIDODERM) 5 % Place 1 patch onto the skin daily as needed. For shoulder pain.Remove & Discard patch within 12 hours or as directed by MD. 30 patch 0  . losartan (COZAAR) 50 MG tablet TAKE 1 TABLET BY MOUTH TWICE DAILY 60 tablet 5  . metFORMIN (GLUCOPHAGE) 500 MG tablet TAKE 1 TABLET BY MOUTH 2  TIMES A DAY 60 tablet 5  . metoCLOPramide (REGLAN) 5 MG tablet TAKE 1 TABLET BY MOUTH 4 TIMES A DAY 120 tablet 2  . Multiple Vitamins-Minerals (CENTRUM SILVER PO) Take 1 tablet by mouth daily.    . NON FORMULARY Place 3 L into the nose daily. Oxygen    . potassium chloride (K-DUR,KLOR-CON) 10 MEQ tablet TAKE 1 TABLET BY MOUTH EVERY DAY 30 tablet 2  . predniSONE (DELTASONE) 50 MG tablet Take '50mg'$  13 hours, 7 hours, and 1 hour before CT scan. 3 tablet 0  . ranitidine (ZANTAC) 300 MG tablet Take 1 tablet (300 mg total) by mouth at bedtime. 30 tablet 5  . Simethicone (GAS-X EXTRA STRENGTH PO) Take 1 tablet by mouth at bedtime as needed. As needed for gas.    . traMADol (ULTRAM) 50 MG tablet TAKE ONE TABLET BY MOUTH 3 TIMES DAILY AS NEEDED FOR PAIN. 90 tablet 0  . triamcinolone ointment (KENALOG) 0.1 % APPLY TOPICALLY TWICE DAILY 60 g 4  . triamterene-hydrochlorothiazide (MAXZIDE-25) 37.5-25 MG per tablet TAKE 1 TABLET BY MOUTH EVERY DAY TO CONTROL BLOOD PRESSURE 90 tablet 1  . Umeclidinium-Vilanterol (ANORO ELLIPTA) 62.5-25 MCG/INH AEPB Inhale into the lungs daily.    . Alum & Mag Hydroxide-Simeth (MAGIC MOUTHWASH) SOLN Swish and swallow 5 cc four times daily as needed (Patient not taking: Reported on 07/31/2014) 200 mL 0  . budesonide-formoterol (SYMBICORT) 160-4.5 MCG/ACT inhaler Inhale 2 puffs into the lungs every 12 (twelve) hours. (Patient not taking: Reported on 07/31/2014) 1 Inhaler 2  . cetirizine-pseudoephedrine  (ZYRTEC-D) 5-120 MG per tablet One twice daily to help congestion (Patient not taking: Reported on 07/31/2014) 60 tablet 3  . COMBIVENT RESPIMAT 20-100 MCG/ACT AERS respimat INHALE 1 PUFF BY MOUTH EVERY 6 HOURS AS NEEDED FOR SHORTNESS OF BREATH (Patient not taking: Reported on 07/31/2014) 12 g 1  . cyclobenzaprine (FLEXERIL) 5 MG tablet One at bed to relax muscles (Patient not taking: Reported on 07/31/2014) 30 tablet 4  . dextromethorphan-guaiFENesin (MUCINEX DM) 30-600 MG per 12 hr tablet One every 12 hours to help congestion and to suppress cough (Patient not taking: Reported on 07/31/2014) 60 tablet 3  . senna (SENOKOT) 8.6 MG TABS tablet Take 1 tablet (8.6 mg total) by mouth at bedtime and may repeat dose one time if needed. For constipation. (Patient not taking: Reported on 05/21/2014) 120 each 6  . [DISCONTINUED] calcium carbonate 200 MG capsule Take 250 mg by mouth daily. Does not take everyday     No current facility-administered medications for this visit.   Facility-Administered Medications Ordered in Other Visits  Medication Dose Route Frequency Provider Last Rate Last Dose  . hyaluronate sodium (RADIAPLEXRX) gel   Topical Once Thea Silversmith, MD      . topical emolient (BIAFINE) emulsion   Topical PRN Thea Silversmith, MD        SURGICAL HISTORY:  Past Surgical History  Procedure Laterality Date  . Lung lobectomy  1986    left  . Septoplasty    . Total hip arthroplasty  01/2010    left  . Bronchoscopy  02/2011  . Dilation and curettage of uterus  1980's  . Tubal ligation  1980's  . Cardiac catheterization    . Fetal blood transfusion  09/16/11  . Joint replacement  04/13/10    Left Hip  . Cataract extraction  03/12/2013    Left Eye     REVIEW OF SYSTEMS:  A comprehensive review of systems was negative except for:  Respiratory: positive for dyspnea on exertion and pleurisy/chest pain   PHYSICAL EXAMINATION: General appearance: alert, cooperative and no distress Head:  Normocephalic, without obvious abnormality, atraumatic Neck: no adenopathy, no JVD, supple, symmetrical, trachea midline and thyroid not enlarged, symmetric, no tenderness/mass/nodules Lymph nodes: Cervical, supraclavicular, and axillary nodes normal. Resp: clear to auscultation bilaterally Back: symmetric, no curvature. ROM normal. No CVA tenderness. Cardio: regular rate and rhythm, S1, S2 normal, no murmur, click, rub or gallop GI: soft, non-tender; bowel sounds normal; no masses,  no organomegaly Extremities: extremities normal, atraumatic, no cyanosis or edema  ECOG PERFORMANCE STATUS: 1 - Symptomatic but completely ambulatory  There were no vitals taken for this visit.  LABORATORY DATA: Lab Results  Component Value Date   WBC 8.6 07/29/2014   HGB 11.9 07/29/2014   HCT 36.1 07/29/2014   MCV 89.7 07/29/2014   PLT 267 07/29/2014      Chemistry      Component Value Date/Time   NA 140 07/29/2014 1332   NA 142 03/12/2014 1737   NA 138 05/23/2012 1009   NA 142 10/25/2011 1200   K 4.1 07/29/2014 1332   K 3.9 03/12/2014 1737   K 4.6 10/25/2011 1200   CL 97 03/12/2014 1737   CL 99 07/21/2012 1125   CL 97* 10/25/2011 1200   CO2 22 07/29/2014 1332   CO2 27 03/12/2014 1737   CO2 24 10/25/2011 1200   BUN 16.8 07/29/2014 1332   BUN 15 03/12/2014 1737   BUN 22 05/23/2012 1009   BUN 15 10/25/2011 1200   CREATININE 1.3* 07/29/2014 1332   CREATININE 1.10* 03/12/2014 1737   CREATININE 0.8 10/25/2011 1200      Component Value Date/Time   CALCIUM 10.0 07/29/2014 1332   CALCIUM 10.5* 03/12/2014 1737   CALCIUM 9.6 10/25/2011 1200   ALKPHOS 83 07/29/2014 1332   ALKPHOS 86 03/12/2014 1737   ALKPHOS 106* 10/25/2011 1200   AST 14 07/29/2014 1332   AST 24 03/12/2014 1737   AST 25 10/25/2011 1200   ALT 10 07/29/2014 1332   ALT 15 03/12/2014 1737   ALT 14 10/25/2011 1200   BILITOT 0.26 07/29/2014 1332   BILITOT 0.3 03/12/2014 1737   BILITOT 0.60 10/25/2011 1200        RADIOGRAPHIC STUDIES: Ct Chest W Contrast  07/29/2014   CLINICAL DATA:  Restaging right lung cancer diagnosed 2012, status post radiation. Left upper lobectomy 1986.  EXAM: CT CHEST WITH CONTRAST  TECHNIQUE: Multidetector CT imaging of the chest was performed during intravenous contrast administration.  CONTRAST:  65m OMNIPAQUE IOHEXOL 300 MG/ML  SOLN  COMPARISON:  01/25/2014  FINDINGS: Mediastinum/Nodes: Small amount of fluid in the superior pericardial recess. Moderate atheromatous aortic and coronary arterial atheromatous calcifications. Great vessels are normal in caliber.  Postsurgical change status post left upper lobectomy noted. No mass is identified adjacent to the resection margin.  Heart size is normal.  Lungs/Pleura: Increased right pleural effusion is identified, currently small. Curvilinear right perihilar consolidation with a linear configuration is subjectively stable, most likely representing radiation change.  In the right upper lung zone, an area of nodular ground-glass airspace opacity is slightly increased in size at 6.7 x 3.8 cm image 24. Portions also appear subjectively slightly more solid, for example image 23. Adjacent subpleural right upper lung zone nodularity measuring 2-3 mm on image 27 is new since previously.  Diffuse emphysematous changes are reidentified. Left upper lung zone scarring is stable. No new nodule, mass, or opacification of the  left residual lung is identified.  Upper abdomen: Left adrenal nodularity is stable. Mild fullness of the adrenal glands bilaterally which likely indicates adrenal hyperplasia. Gallstones partly visualized without other visualized CT evidence for acute cholecystitis.  Musculoskeletal: Healing right posterior fifth rib fracture reidentified. Mottled appearance of the right posterior third rib reidentified. T4 vertebral body augmentation and T4-T5 vertebral body compression deformities are reidentified.  IMPRESSION: Increase in  ill-defined nodular ground-glass airspace opacity within the right upper lung zone, highly suspicious for adenocarcinoma. Adjacent 2-3 mm nodules are nonspecific but suspicious in this context.  Stable left upper lobectomy postsurgical change.  Increased, now small, right pleural effusion.  Possible pathologic fracture reidentified, right posterior fifth rib.  Abnormal appearance of the right posterior third rib again noted, for which primary differential considerations again include metastasis versus post radiation change.   Electronically Signed   By: Conchita Paris M.D.   On: 07/29/2014 15:28   ASSESSMENT AND PLAN: this is a very pleasant 70 years old white female with history of stage IIB/IIIA non-small cell lung cancer status post concurrent chemoradiation followed by consolidation chemotherapy with carboplatin and Alimta and has been observation since April 2013. She has no evidence for disease progression on his recent scan except for a slightly enlarging right upper lobe opacity. I discussed the scan results with the patient and recommended for her to continue on observation for now. I would see her back for follow up visit in 4 months with repeat CT scan of the chest for further evaluation of the right upper lobe lung opacity. She was advised to call immediately if she has any concerning symptoms in the interval.  The patient voices understanding of current disease status and treatment options and is in agreement with the current care plan.  All questions were answered. The patient knows to call the clinic with any problems, questions or concerns. We can certainly see the patient much sooner if necessary.  Disclaimer: This note was dictated with voice recognition software. Similar sounding words can inadvertently be transcribed and may not be corrected upon review.

## 2014-08-01 ENCOUNTER — Ambulatory Visit (INDEPENDENT_AMBULATORY_CARE_PROVIDER_SITE_OTHER): Payer: Medicare Other | Admitting: Emergency Medicine

## 2014-08-01 ENCOUNTER — Encounter: Payer: Self-pay | Admitting: Emergency Medicine

## 2014-08-01 VITALS — BP 122/70 | HR 98 | Ht 66.0 in | Wt 169.0 lb

## 2014-08-01 DIAGNOSIS — J449 Chronic obstructive pulmonary disease, unspecified: Secondary | ICD-10-CM

## 2014-08-01 DIAGNOSIS — E1142 Type 2 diabetes mellitus with diabetic polyneuropathy: Secondary | ICD-10-CM | POA: Diagnosis not present

## 2014-08-01 DIAGNOSIS — C3491 Malignant neoplasm of unspecified part of right bronchus or lung: Secondary | ICD-10-CM | POA: Diagnosis not present

## 2014-08-01 DIAGNOSIS — G629 Polyneuropathy, unspecified: Secondary | ICD-10-CM | POA: Diagnosis not present

## 2014-08-01 NOTE — Assessment & Plan Note (Signed)
With a slight increase in right upper lobe groundglass opacity on her most recent CT scan compared with prior. Current plan is for her to have a repeat CT in 4 months and to be seen by [name]

## 2014-08-01 NOTE — Progress Notes (Signed)
Subjective:    Patient ID: [name], female    DOB: December 16, 1945, 69 y.o.   MRN: [id number] HPI History of Present Illness:  [name] is a 69 y.o. 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 Has received XRT and chemo with Drs Pablo Ledger and Julien Nordmann.  She tolerated XRT completely. She has completed 3 cycles chemo.  Currently managed on combivent qid, started O2.   ROV 11/26/11 -- COPD with adenoCA 12/12 (chemo/XRT).  Presents today reporting R back and shoulder pain - is musculoskeletal, is involving R chest and R shoulder as well. She was exerting herself 3 weeks ago, believes she was sore afterwards and now with muscle pain. Dr Guinevere Ferrari gave her flexeril. [facility], Dr Hardin Negus. She is having more cough, which is exacerbating her back and shoulder pain.   ROV 03/03/12 -- COPD with adenoCA 12/12 (chemo/XRT). Currently on combivent, taking it prn. 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 Stable CT scan on 01/17/12. She is having more SOB. Was started on avelox recently for sinusitis, no other exacerbations. Marginal compliance with her O2  ROV 05/16/12 -- COPD with adenoCA 12/12. She developed URI sx, nasal obstruction, clear to yellow nasal congestion, scratchy throat. She tried restarting restarting claritin and mucinex-D a couple weeks ago. She has been using both prn - little improvement. She has a throat trickle, cough that is sometimes productive - white, yellow, clear. Using combivent twice a day for last two weeks > helps her cough.   Acute OV 12/27/12 -- COPD with adenoCA 12/12 (chemo/XRT). For last 2 weeks has had nasal drainage, low energy, cough usually non-prod, more indigestion. Was treated w Augmentin. She started mucinex DM x 2 weeks. She stopped loratadine 3 weeks ago because pressure in her head. Last CT scan was in 07/2012. Due to see Dr Julien Nordmann next month.   ROV 05/01/13 -- COPD with adenoCA  12/12 (chemo/XRT). Her last CT scan was done 01/22/13, read as stable from CA standpoint. She is still dealing with cough, dyspnea and a smothering feeling. She wears O2 prn, with most exertion, at 2.5L/min. She is using combivent about 4x a day.   ROV 08/31/13 -- COPD with adenoCA 12/12 (chemo/XRT). CT scan 08/23/13 was stable. O2 dependent w exertion.  She has baseline exertional SOB. starting about 6 weeks ago began to have nasal drainage, green mucous. She changed from loratadine to zyrtec-D. Uses nasal saline spray. Has never tolerated other nasal sprays due to dryness. Uses combivent qid.   ROV 10/23/13 -- COPD with RUL adenoCA 12/'12 (chemo/XRT). Last Ct chest was 07/24/13. She reports that she has been experiencing am B am numbness, some R pectoral and shoulder pain. Of note she has been using and rolling her wheelchair. Her breathing is very limiting. She has nasal congestion and was recently started on mucinex D. Hasn't been taking loratadine. She is using clombivent ~4 times a day.   ROV 12/04/13 -- COPD with RUL adenoCA 12/'12 (chemo/XRT). Since last time she has had significant difficulty with her inhaled meds. I tried to change her combivent to Anoro, but she was unable to tolerate due to tongue and lip swelling. I changed her to spiriva and symbicort > she felt little response, sore throat, maybe tongue swelling. Tried Anoro again and the tongue swelling happened again. She feels that the Anoro helps her breathing the most, but she has reliably had  the side effects. She is back to combivent qid now.  She is having yellow nasal drainage, green sputum.   ROV 02/12/14 -- follow up for COPD and RUL adenoCA (2012). Last time we continued combivent qid, also treated with levaquin for suspected sinusitis. She complains today of nasal congestion, drainage, ear and sinus fullness.    ROV 08/01/14 -- follow-up visit for COPD and adenocarcinoma of the long in the right upper lobe. She underwent chemotherapy  and radiation therapy and is currently under observation.  Her last CT scan was 07/29/14. There was some possible increase in groundglass the right upper lung zone. She c/o being more weak, more DOE. She is more fatigued. Her o2 is set on 2L/min, occasionally to 3L/min.      Objective:   Physical Exam Filed Vitals:   08/01/14 1449  BP: 122/70  Pulse: 98  Height: '5\' 6"'$  (1.676 m)  Weight: 76.658 kg (169 lb)  SpO2: 99%   Gen: Pleasant, elderly woman, in no distress,  normal affect  ENT: No lesions,  mouth clear,  oropharynx clear, no postnasal drip  Neck: No JVD, no TMG, no carotid bruits  Lungs: No use of accessory muscles, distant, no wheezes or crackles  Cardiovascular: RRR, heart sounds normal, no murmur or gallops, no peripheral edema  Musculoskeletal: No deformities, no cyanosis or clubbing. Tender to palp of the R pectoral area  Neuro: alert, non focal  Skin: Warm, no lesions or rashes   07/29/14 --  COMPARISON: 01/25/2014  FINDINGS: Mediastinum/Nodes: Small amount of fluid in the superior pericardial recess. Moderate atheromatous aortic and coronary arterial atheromatous calcifications. Great vessels are normal in caliber.  Postsurgical change status post left upper lobectomy noted. No mass is identified adjacent to the resection margin.  Heart size is normal.  Lungs/Pleura: Increased right pleural effusion is identified, currently small. Curvilinear right perihilar consolidation with a linear configuration is subjectively stable, most likely representing radiation change.  In the right upper lung zone, an area of nodular ground-glass airspace opacity is slightly increased in size at 6.7 x 3.8 cm image 24. Portions also appear subjectively slightly more solid, for example image 23. Adjacent subpleural right upper lung zone nodularity measuring 2-3 mm on image 27 is new since previously.  Diffuse emphysematous changes are reidentified. Left upper lung  zone scarring is stable. No new nodule, mass, or opacification of the left residual lung is identified.  Upper abdomen: Left adrenal nodularity is stable. Mild fullness of the adrenal glands bilaterally which likely indicates adrenal hyperplasia. Gallstones partly visualized without other visualized CT evidence for acute cholecystitis.  Musculoskeletal: Healing right posterior fifth rib fracture reidentified. Mottled appearance of the right posterior third rib reidentified. T4 vertebral body augmentation and T4-T5 vertebral body compression deformities are reidentified.  IMPRESSION: Increase in ill-defined nodular ground-glass airspace opacity within the right upper lung zone, highly suspicious for adenocarcinoma. Adjacent 2-3 mm nodules are nonspecific but suspicious in this context.  Stable left upper lobectomy postsurgical change.  Increased, now small, right pleural effusion.  Possible pathologic fracture reidentified, right posterior fifth rib.  Abnormal appearance of the right posterior third rib again noted, for which primary differential considerations again include metastasis versus post radiation change.      Assessment & Plan:  Non-small cell lung cancer With a slight increase in right upper lobe groundglass opacity on her most recent CT scan compared with prior. Current plan is for her to have a repeat CT in 4 months and to be seen  by [name]   COLD (chronic obstructive lung disease) She has not tolerated long-acting anticholinergic meds. She is currently using Combivent 3-4 times a day. We will continue this regimen

## 2014-08-01 NOTE — Assessment & Plan Note (Signed)
She has not tolerated long-acting anticholinergic meds. She is currently using Combivent 3-4 times a day. We will continue this regimen

## 2014-08-01 NOTE — Patient Instructions (Addendum)
Please continue your combivent 4 times a day.  We will perform walking oximetry on 2L/min to see if this is enough  Continue to follow with Dr Julien Nordmann  Repeat Ct scan in 4 months.  Follow with Dr Lamonte Sakai in 3 months or sooner if you have any problems.

## 2014-08-20 ENCOUNTER — Ambulatory Visit: Payer: Medicare Other | Admitting: Internal Medicine

## 2014-08-20 ENCOUNTER — Other Ambulatory Visit: Payer: Self-pay

## 2014-08-20 DIAGNOSIS — M545 Low back pain, unspecified: Secondary | ICD-10-CM

## 2014-08-20 MED ORDER — HYDROCODONE-ACETAMINOPHEN 7.5-325 MG PO TABS: ORAL_TABLET | ORAL | Status: DC

## 2014-08-20 NOTE — Telephone Encounter (Signed)
Patient was here today for appointment, patient unable to be seen due to [place] [facility]. Patient will contact her caseworker to get insurance issues resolved and call to reschedule appointment.  Patient requested refill for Hydrocodone.  I discussed this with Anita/CMA (Triage Assistant) and she agreed ok to give rx and inform patient she will need to have insurance issues resolved prior to next refill.   Dr.Green was verbally informed that patient will not be seen today due to insurance issues.   Patient verbalized understanding and stated she plans on calling tomorrow to get this resolved for she needs to get advice/recommendations from Dr.Green about her health.

## 2014-08-26 ENCOUNTER — Other Ambulatory Visit: Payer: Self-pay | Admitting: Internal Medicine

## 2014-08-28 ENCOUNTER — Other Ambulatory Visit: Payer: Self-pay | Admitting: Internal Medicine

## 2014-09-11 ENCOUNTER — Telehealth: Payer: Self-pay | Admitting: *Deleted

## 2014-09-11 NOTE — Telephone Encounter (Signed)
Patient called requesting samples of Boost,  She states its the only thing that helps and she cannot afford it.  Requested that we check with the dietician.  Will Route to [facility] and call her

## 2014-09-12 ENCOUNTER — Telehealth: Payer: Self-pay | Admitting: Nutrition

## 2014-09-12 NOTE — Telephone Encounter (Signed)
Received message from RN patient is requesting samples of boost. Contacted patient by phone but she was unavailable. I've asked patient to contact me to provide more information about flavors and specific product information.   Left name and phone number for patient to call me.

## 2014-09-17 ENCOUNTER — Ambulatory Visit (INDEPENDENT_AMBULATORY_CARE_PROVIDER_SITE_OTHER): Payer: Medicare Other | Admitting: Internal Medicine

## 2014-09-17 ENCOUNTER — Encounter: Payer: Self-pay | Admitting: Internal Medicine

## 2014-09-17 VITALS — BP 138/68 | HR 97 | Temp 97.7°F | Resp 20 | Ht 66.0 in | Wt 171.0 lb

## 2014-09-17 DIAGNOSIS — M25552 Pain in left hip: Secondary | ICD-10-CM | POA: Diagnosis not present

## 2014-09-17 DIAGNOSIS — E1142 Type 2 diabetes mellitus with diabetic polyneuropathy: Secondary | ICD-10-CM

## 2014-09-17 DIAGNOSIS — M5136 Other intervertebral disc degeneration, lumbar region: Secondary | ICD-10-CM | POA: Diagnosis not present

## 2014-09-17 DIAGNOSIS — G8929 Other chronic pain: Secondary | ICD-10-CM | POA: Diagnosis not present

## 2014-09-17 DIAGNOSIS — C3491 Malignant neoplasm of unspecified part of right bronchus or lung: Secondary | ICD-10-CM

## 2014-09-17 DIAGNOSIS — D696 Thrombocytopenia, unspecified: Secondary | ICD-10-CM

## 2014-09-17 DIAGNOSIS — G44229 Chronic tension-type headache, not intractable: Secondary | ICD-10-CM

## 2014-09-17 DIAGNOSIS — G629 Polyneuropathy, unspecified: Secondary | ICD-10-CM

## 2014-09-17 DIAGNOSIS — J449 Chronic obstructive pulmonary disease, unspecified: Secondary | ICD-10-CM | POA: Diagnosis not present

## 2014-09-17 DIAGNOSIS — M25559 Pain in unspecified hip: Secondary | ICD-10-CM

## 2014-09-17 HISTORY — DX: Other chronic pain: G89.29

## 2014-09-17 MED ORDER — HYDROCODONE-ACETAMINOPHEN 10-325 MG PO TABS: ORAL_TABLET | ORAL | Status: DC

## 2014-09-17 MED ORDER — CETIRIZINE-PSEUDOEPHEDRINE ER 5-120 MG PO TB12: ORAL_TABLET | ORAL | Status: DC

## 2014-09-17 NOTE — Progress Notes (Signed)
Patient ID: [name], female   DOB: 26-May-1945, 69 y.o.   MRN: [id number]    Facility  [facility]    Place of Service:   OFFICE    Allergies  Allergen Reactions  . Iohexol Shortness Of Breath and Other (See Comments)    "burns me up inside"    Pt does fine with 13 hour prep  01/17/12  . Other     CAN'T STAND THE SMELL OF PURE BLEACH; "HAVE TO BACK UP AND POUR AND DON'T BREATH IT TIL i GET CAP BACK ON; DILUTE IT TO SPRAY"  . Anoro Ellipta [Umeclidinium-Vilanterol] Swelling    Swelling of lips and mouth  . Codeine Itching and Nausea And Vomiting    REACTION: GI upset  . Protonix [Pantoprazole Sodium] Diarrhea    Chief Complaint  Patient presents with  . Medical Management of Chronic Issues    HPI:  DM type 2 with diabetic peripheral neuropathy: last glucose 200 mg %. Home glucose 150-160 mg %.  Non-small cell lung cancer, right: coughing up Jessina Marse phlegm and clot of blood  2 weeks ago.  Chronic obstructive pulmonary disease, unspecified COPD, unspecified chronic bronchitis type: chronically dyspneic at rest and on exertion  Thrombocytopenia: resolved  Chronic tension-type headache, not intractable: in the left occi[put and lefyt side of the neck  Hip pain, chronic, left: benefits from hydrocone  DDD (degenerative disc disease), lumbar: benefits from hydrocodone    Medications: Patient's Medications  New Prescriptions   No medications on file  Previous Medications   ALBUTEROL-IPRATROPIUM (COMBIVENT) 18-103 MCG/ACT INHALER    every 4 (four) hours as needed. 2 puffs every for hours for lung cancer   ALPRAZOLAM (XANAX) 1 MG TABLET    TAKE 1 TABLET BY MOUTH THREE TIMES A DAY AS NEEDED FOR ANXIETY   AMBULATORY NON FORMULARY MEDICATION    Lighter Weight Rollator with Padded Seat and Hand Brakes Dx: 496, 729.1, 722.52   ASPIRIN EC 325 MG TABLET    Take 325 mg by mouth daily.   CALCIUM CITRATE-VITAMIN D (CITRACAL + D PO)    Take 2 tablets by mouth daily.   CITALOPRAM  (CELEXA) 20 MG TABLET    TAKE 1 TABLET BY MOUTH EVERY DAY TO HELP MOOD   DILTIAZEM (CARDIZEM CD) 240 MG 24 HR CAPSULE    TAKE 1 CAPSULE BY MOUTH EVERY DAY FOR BLOOD PRESSURE   FEFUM-FEPOLY-FA-B CMP-C-BIOT (INTEGRA PLUS) CAPS    Take 1 capsule by mouth daily.   FLUTICASONE (FLONASE) 50 MCG/ACT NASAL SPRAY    INSTILL 2 SPRAYS INTO BOTH NOSTRILS  TWICE DAILY   GEMFIBROZIL (LOPID) 600 MG TABLET    TAKE 1 TABLET BY MOUTH ONCE DAILY   GLUCOSE BLOOD TEST STRIP    Check blood sugar once daily  DX: 250.00   HYDROCODONE-ACETAMINOPHEN (NORCO) 7.5-325 MG PER TABLET    Take 1 tablet by mouth every 6 hours as needed for severe pain   LIDOCAINE (LIDODERM) 5 %    Place 1 patch onto the skin daily as needed. For shoulder pain.Remove & Discard patch within 12 hours or as directed by MD.   LOSARTAN (COZAAR) 50 MG TABLET    TAKE 1 TABLET BY MOUTH TWICE DAILY   METFORMIN (GLUCOPHAGE) 500 MG TABLET    TAKE 1 TABLET BY MOUTH 2 TIMES A DAY   METOCLOPRAMIDE (REGLAN) 5 MG TABLET    TAKE 1 TABLET BY MOUTH 4 TIMES A DAY   MULTIPLE VITAMINS-MINERALS (CENTRUM SILVER PO)  Take 1 tablet by mouth daily.   NON FORMULARY    Place 3 L into the nose daily. Oxygen   POTASSIUM CHLORIDE (K-DUR,KLOR-CON) 10 MEQ TABLET    TAKE 1 TABLET BY MOUTH EVERY DAY   RANITIDINE (ZANTAC) 300 MG TABLET    Take 1 tablet (300 mg total) by mouth at bedtime.   SIMETHICONE (GAS-X EXTRA STRENGTH PO)    Take 1 tablet by mouth at bedtime as needed. As needed for gas.   TRAMADOL (ULTRAM) 50 MG TABLET    TAKE ONE TABLET BY MOUTH 3 TIMES DAILY AS NEEDED FOR PAIN.   TRIAMCINOLONE OINTMENT (KENALOG) 0.1 %    APPLY TOPICALLY TWICE DAILY   TRIAMTERENE-HYDROCHLOROTHIAZIDE (MAXZIDE-25) 37.5-25 MG PER TABLET    TAKE 1 TABLET BY MOUTH EVERY DAY TO CONTROL BLOOD PRESSURE   UMECLIDINIUM-VILANTEROL (ANORO ELLIPTA) 62.5-25 MCG/INH AEPB    Inhale into the lungs daily.  Modified Medications   No medications on file  Discontinued Medications   DIPHENHYDRAMINE (BENADRYL)  50 MG TABLET    Take 1 tablet (50 mg total) by mouth as directed. 1 hour before CT scan     Review of Systems  Constitutional: Positive for activity change. Negative for fever and chills.  HENT: Positive for postnasal drip, sinus pressure and trouble swallowing. Negative for congestion, ear pain and sore throat.        Popping noise in ears  Eyes: Negative.   Respiratory: Positive for cough and shortness of breath. Negative for choking, chest tightness, wheezing and stridor.   Cardiovascular: Negative for chest pain and palpitations.  Gastrointestinal: Positive for constipation. Negative for nausea, abdominal pain, diarrhea and abdominal distention.       Reflux frequently  Musculoskeletal: Positive for myalgias, back pain, arthralgias, gait problem and neck pain.  Skin: Negative.   Neurological: Positive for dizziness, weakness (generalized), light-headedness and headaches. Negative for tremors and syncope.  Hematological: Negative.   Psychiatric/Behavioral: Positive for sleep disturbance and dysphoric mood. Negative for hallucinations. The patient is not nervous/anxious and is not hyperactive.     Filed Vitals:   09/17/14 1340  BP: 138/68  Pulse: 97  Temp: 97.7 F (36.5 C)  TempSrc: Oral  Resp: 20  Height: 5' 6"  (1.676 m)  Weight: 171 lb (77.565 kg)  SpO2: 94%   Body mass index is 27.61 kg/(m^2).  Physical Exam  Constitutional: She is oriented to person, place, and time.  overweight  HENT:  Nose: Nose normal.  Mouth/Throat: Oropharynx is clear and moist. No oropharyngeal exudate.  Loss of hearing.  Eyes: Conjunctivae and EOM are normal. Pupils are equal, round, and reactive to light.  Neck: No JVD present. No tracheal deviation present. No thyromegaly present.  Cardiovascular: Normal rate, regular rhythm and normal heart sounds.  Exam reveals no gallop and no friction rub.   No murmur heard. Pulmonary/Chest: No respiratory distress. She has no wheezes. She has no rales.  She exhibits no tenderness.  Abdominal: She exhibits no distension and no mass. There is no tenderness.  Musculoskeletal: Normal range of motion. She exhibits edema and tenderness.  Lymphadenopathy:    She has no cervical adenopathy.  Neurological: She is oriented to person, place, and time. No cranial nerve deficit. Coordination normal.  Skin: No rash noted. No erythema. No pallor.  Psychiatric: Her behavior is normal. Judgment and thought content normal.     Labs reviewed: Appointment on 07/29/2014  Component Date Value Ref Range Status  . WBC 07/29/2014 8.6  3.9 - 10.3  10e3/uL Final  . NEUT# 07/29/2014 7.5* 1.5 - 6.5 10e3/uL Final  . HGB 07/29/2014 11.9  11.6 - 15.9 g/dL Final  . HCT 07/29/2014 36.1  34.8 - 46.6 % Final  . Platelets 07/29/2014 267  145 - 400 10e3/uL Final  . MCV 07/29/2014 89.7  79.5 - 101.0 fL Final  . MCH 07/29/2014 29.5  25.1 - 34.0 pg Final  . MCHC 07/29/2014 32.9  31.5 - 36.0 g/dL Final  . RBC 07/29/2014 4.03  3.70 - 5.45 10e6/uL Final  . RDW 07/29/2014 13.1  11.2 - 14.5 % Final  . lymph# 07/29/2014 1.0  0.9 - 3.3 10e3/uL Final  . MONO# 07/29/2014 0.0* 0.1 - 0.9 10e3/uL Final  . Eosinophils Absolute 07/29/2014 0.0  0.0 - 0.5 10e3/uL Final  . Basophils Absolute 07/29/2014 0.0  0.0 - 0.1 10e3/uL Final  . NEUT% 07/29/2014 87.7* 38.4 - 76.8 % Final  . LYMPH% 07/29/2014 11.6* 14.0 - 49.7 % Final  . MONO% 07/29/2014 0.4  0.0 - 14.0 % Final  . EOS% 07/29/2014 0.0  0.0 - 7.0 % Final  . BASO% 07/29/2014 0.3  0.0 - 2.0 % Final  . Sodium 07/29/2014 140  136 - 145 mEq/L Final  . Potassium 07/29/2014 4.1  3.5 - 5.1 mEq/L Final  . Chloride 07/29/2014 101  98 - 109 mEq/L Final  . CO2 07/29/2014 22  22 - 29 mEq/L Final  . Glucose 07/29/2014 200* 70 - 140 mg/dl Final  . BUN 07/29/2014 16.8  7.0 - 26.0 mg/dL Final  . Creatinine 07/29/2014 1.3* 0.6 - 1.1 mg/dL Final  . Total Bilirubin 07/29/2014 0.26  0.20 - 1.20 mg/dL Final  . Alkaline Phosphatase 07/29/2014 83  40 -  150 U/L Final  . AST 07/29/2014 14  5 - 34 U/L Final  . ALT 07/29/2014 10  0 - 55 U/L Final  . Total Protein 07/29/2014 7.0  6.4 - 8.3 g/dL Final  . Albumin 07/29/2014 4.0  3.5 - 5.0 g/dL Final  . Calcium 07/29/2014 10.0  8.4 - 10.4 mg/dL Final  . Anion Gap 07/29/2014 17* 3 - 11 mEq/L Final  . EGFR 07/29/2014 41* >90 ml/min/1.73 m2 Final   eGFR is calculated using the CKD-EPI Creatinine Equation (2009)  Appointment on 07/23/2014  Component Date Value Ref Range Status  . WBC 07/23/2014 8.1  3.4 - 10.8 x10E3/uL Final  . RBC 07/23/2014 3.91  3.77 - 5.28 x10E6/uL Final  . Hemoglobin 07/23/2014 11.7  11.1 - 15.9 g/dL Final  . HCT 07/23/2014 35.7  34.0 - 46.6 % Final  . MCV 07/23/2014 91  79 - 97 fL Final  . MCH 07/23/2014 29.9  26.6 - 33.0 pg Final  . MCHC 07/23/2014 32.8  31.5 - 35.7 g/dL Final  . RDW 07/23/2014 13.6  12.3 - 15.4 % Final  . Platelets 07/23/2014 258  150 - 379 x10E3/uL Final  . Neutrophils Relative % 07/23/2014 62   Final  . Lymphs 07/23/2014 28   Final  . Monocytes 07/23/2014 7   Final  . Eos 07/23/2014 3   Final  . Basos 07/23/2014 0   Final  . Neutrophils Absolute 07/23/2014 4.9  1.4 - 7.0 x10E3/uL Final  . Lymphocytes Absolute 07/23/2014 2.3  0.7 - 3.1 x10E3/uL Final  . Monocytes Absolute 07/23/2014 0.6  0.1 - 0.9 x10E3/uL Final  . Eosinophils Absolute 07/23/2014 0.3  0.0 - 0.4 x10E3/uL Final  . Basophils Absolute 07/23/2014 0.0  0.0 - 0.2 x10E3/uL Final  . Immature  Granulocytes 07/23/2014 0   Final  . Immature Grans (Abs) 07/23/2014 0.0  0.0 - 0.1 x10E3/uL Final   Ct Chest W Contrast  07/29/2014   CLINICAL DATA:  Restaging right lung cancer diagnosed 2012, status post radiation. Left upper lobectomy 1986.  EXAM: CT CHEST WITH CONTRAST  TECHNIQUE: Multidetector CT imaging of the chest was performed during intravenous contrast administration.  CONTRAST:  17m OMNIPAQUE IOHEXOL 300 MG/ML  SOLN  COMPARISON:  01/25/2014  FINDINGS: Mediastinum/Nodes: Small amount of fluid  in the superior pericardial recess. Moderate atheromatous aortic and coronary arterial atheromatous calcifications. Great vessels are normal in caliber.  Postsurgical change status post left upper lobectomy noted. No mass is identified adjacent to the resection margin.  Heart size is normal.  Lungs/Pleura: Increased right pleural effusion is identified, currently small. Curvilinear right perihilar consolidation with a linear configuration is subjectively stable, most likely representing radiation change.  In the right upper lung zone, an area of nodular ground-glass airspace opacity is slightly increased in size at 6.7 x 3.8 cm image 24. Portions also appear subjectively slightly more solid, for example image 23. Adjacent subpleural right upper lung zone nodularity measuring 2-3 mm on image 27 is new since previously.  Diffuse emphysematous changes are reidentified. Left upper lung zone scarring is stable. No new nodule, mass, or opacification of the left residual lung is identified.  Upper abdomen: Left adrenal nodularity is stable. Mild fullness of the adrenal glands bilaterally which likely indicates adrenal hyperplasia. Gallstones partly visualized without other visualized CT evidence for acute cholecystitis.  Musculoskeletal: Healing right posterior fifth rib fracture reidentified. Mottled appearance of the right posterior third rib reidentified. T4 vertebral body augmentation and T4-T5 vertebral body compression deformities are reidentified.   IMPRESSION: Increase in ill-defined nodular ground-glass airspace opacity within the right upper lung zone, highly suspicious for adenocarcinoma. Adjacent 2-3 mm nodules are nonspecific but suspicious in this context.  Stable left upper lobectomy postsurgical change.  Increased, now small, right pleural effusion.  Possible pathologic fracture reidentified, right posterior fifth rib.  Abnormal appearance of the right posterior third rib again noted, for which primary  differential considerations again include metastasis versus post radiation change.   Electronically Signed   By: GConchita ParisM.D.   On: 07/29/2014 15:28    Assessment/Plan  1. DM type 2 with diabetic peripheral neuropathy controlled  2. Non-small cell lung cancer, right Continues with Dr. MJulien Nordmannand Dr.  BLamonte Sakai 3. Chronic obstructive pulmonary disease, unspecified COPD, unspecified chronic bronchitis type Continue with Drt. Byrum  4. Thrombocytopenia resolved  5. Chronic tension-type headache, not intractable - HYDROcodone-acetaminophen (NORCO) 10-325 MG per tablet; One every 6 hours if needed for pain  Dispense: 120 tablet; Refill: 0  6. Hip pain, chronic, left - HYDROcodone-acetaminophen (NORCO) 10-325 MG per tablet; One every 6 hours if needed for pain  Dispense: 120 tablet; Refill: 0  7. DDD (degenerative disc disease), lumbar Use Hydrocodone -acetaminophen - HYDROcodone-acetaminophen (NORCO) 10-325 MG per tablet; One every 6 hours if needed for pain  Dispense: 120 tablet; Refill: 0

## 2014-09-25 ENCOUNTER — Other Ambulatory Visit: Payer: Self-pay | Admitting: Internal Medicine

## 2014-09-26 ENCOUNTER — Other Ambulatory Visit: Payer: Self-pay | Admitting: *Deleted

## 2014-09-26 MED ORDER — ALPRAZOLAM 1 MG PO TABS: ORAL_TABLET | ORAL | Status: DC

## 2014-09-27 ENCOUNTER — Telehealth: Payer: Self-pay | Admitting: Internal Medicine

## 2014-09-27 NOTE — Telephone Encounter (Signed)
s.w. pt and and advised on 8.24 appt moved to 8.22 due to MD on pal..Marland KitchenMarland KitchenMarland Kitchenpt ok and aware

## 2014-10-04 ENCOUNTER — Ambulatory Visit: Payer: Medicare Other | Admitting: Internal Medicine

## 2014-10-08 ENCOUNTER — Encounter: Payer: Self-pay | Admitting: Nurse Practitioner

## 2014-10-08 ENCOUNTER — Ambulatory Visit (INDEPENDENT_AMBULATORY_CARE_PROVIDER_SITE_OTHER): Payer: Medicare Other | Admitting: Nurse Practitioner

## 2014-10-08 VITALS — BP 128/76 | HR 86 | Temp 98.3°F | Resp 18 | Ht 66.0 in | Wt 170.0 lb

## 2014-10-08 DIAGNOSIS — J329 Chronic sinusitis, unspecified: Secondary | ICD-10-CM

## 2014-10-08 DIAGNOSIS — B370 Candidal stomatitis: Secondary | ICD-10-CM

## 2014-10-08 DIAGNOSIS — L853 Xerosis cutis: Secondary | ICD-10-CM | POA: Diagnosis not present

## 2014-10-08 MED ORDER — MAGIC MOUTHWASH
5.0000 mL | Freq: Three times a day (TID) | ORAL | Status: DC
Start: 2014-10-08 — End: 2014-10-08

## 2014-10-08 MED ORDER — AMOXICILLIN-POT CLAVULANATE 875-125 MG PO TABS
1.0000 | ORAL_TABLET | Freq: Two times a day (BID) | ORAL | Status: DC
Start: 2014-10-08 — End: 2014-11-12

## 2014-10-08 MED ORDER — LIDOCAINE 5 % EX PTCH: 1.0000 | MEDICATED_PATCH | Freq: Every day | CUTANEOUS | Status: DC | PRN

## 2014-10-08 MED ORDER — FIRST-DUKES MOUTHWASH MT SUSP: 5.0000 mL | Freq: Three times a day (TID) | OROMUCOSAL | Status: DC

## 2014-10-08 MED ORDER — FLUCONAZOLE 150 MG PO TABS
150.0000 mg | ORAL_TABLET | Freq: Once | ORAL | Status: DC
Start: 2014-10-08 — End: 2014-12-23

## 2014-10-08 NOTE — Patient Instructions (Signed)
Use magic mouthwash, switch and spit three times daily Will give one time dose of diflucan 150 mg - can take when you pick up from pharmacy  Use Eucerin cream to dry skin at least twice daily

## 2014-10-08 NOTE — Progress Notes (Signed)
Patient ID: [name], female   DOB: 1946-02-24, 69 y.o.   MRN: [id number]    PCP: Estill Dooms, MD  Allergies  Allergen Reactions  . Iohexol Shortness Of Breath and Other (See Comments)    "burns me up inside"    Pt does fine with 13 hour prep  01/17/12  . Other     CAN'T STAND THE SMELL OF PURE BLEACH; "HAVE TO BACK UP AND POUR AND DON'T BREATH IT TIL i GET CAP BACK ON; DILUTE IT TO SPRAY"  . Anoro Ellipta [Umeclidinium-Vilanterol] Swelling    Swelling of lips and mouth  . Codeine Itching and Nausea And Vomiting    REACTION: GI upset  . Protonix [Pantoprazole Sodium] Diarrhea    Chief Complaint  Patient presents with  . Acute Visit    Mouth is raw and throat sore. Patient thinks she has a yeast infection in mouth. When patient brushes teeth it burns mouth.    . Medication Refill    Lidocaine patch, last filled 2014     HPI: Patient is a 69 y.o. female seen in the office today due to mouth and throat pain. Hx of thrush, was treated with magic mouth wash. Mouth is still raw.  Pt was using mouthwash for about 14 days because mouth was getting better but now it is worse.  Food has a weird taste.   Reports increased pressure in ears and head. Hurts. Increased sinus congestion. Has been going on for weeks now. Changed from Claritin to [place] but has not worked.  Feels like something is inside her ears and it is bad No fevers or chills.   Review of Systems:  Review of Systems  Constitutional: Negative for activity change, appetite change, fatigue and unexpected weight change.  HENT: Positive for congestion, ear pain, postnasal drip, sinus pressure, sore throat and trouble swallowing. Negative for hearing loss, mouth sores and tinnitus.   Eyes: Negative.   Respiratory: Negative for cough and shortness of breath.   Cardiovascular: Negative for chest pain, palpitations and leg swelling.  Gastrointestinal: Negative for abdominal pain, diarrhea and constipation.    Genitourinary: Negative for dysuria and difficulty urinating.  Musculoskeletal: Negative for myalgias and arthralgias.  Skin: Negative for color change, rash and wound.       Itchy skin  Neurological: Negative for dizziness and weakness.  Psychiatric/Behavioral: Negative for behavioral problems, confusion and agitation.    Past Medical History  Diagnosis Date  . 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 Restless legs syndrome (RLS)   . Chronic sinusitis   . Hypertension   . Hyperlipidemia   . Anemia, unspecified   . GERD (gastroesophageal reflux disease)   . Respiratory failure with hypoxia 01/02/2010  . Complication of anesthesia     slow to wake up  . Asthma   . Coronary artery disease     Dr. Burt Knack had stress test done  . Type II or unspecified type diabetes mellitus without mention of complication, not stated as uncontrolled   . Peripheral vascular disease   . PAD (peripheral artery disease)   . Heart murmur   . Dysrhythmia     irregular  . Anginal pain   . History of blood clots     "2 in my aorta"  . Hard of hearing, right   . History of bronchitis   . Bronchial pneumonia     history  . COPD (chronic obstructive pulmonary disease)   . Emphysema   . Shortness of  breath     "anytime really"  . Syncope and collapse   . Adenocarcinoma, lung     upper left lobe  . 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 Chemotherapy adverse reaction 09/15/11    "makes me light headed and pass out; this is 3rd blood transfusion since going thru it"  . History of blood transfusion   . Kidney infection     "related to chemo"  . Sinus headache     "all the time"  . Arthritis     "all over my body"  . Fibromyalgia   . Osteoporosis   . DDD (degenerative disc disease), lumbar   . Scoliosis   . Anxiety   . Mental disorder   . Reflux   . CHF (congestive heart failure)   . DVT (deep venous thrombosis)   . Hx of radiation therapy 04/28/11 -06/08/11     right upper lobe-lung  . Acute upper respiratory infections of unspecified site   . Pain in thoracic spine   . Asymptomatic varicose veins   . Functional urinary incontinence   . Diseases of lips   . Other abnormal blood chemistry   . Effects of radiation, unspecified   . Anemia in neoplastic disease   . Muscle weakness (generalized)   . Dizziness and giddiness   . Spasm of muscle   . Unspecified vitamin D deficiency   . Hyperpotassemia   . Acute posthemorrhagic anemia   . Unspecified hearing loss   . Disorder of bone and cartilage, unspecified   . Screening for thyroid disorder   . Malignant neoplasm of bronchus and lung, unspecified site   . Hypopotassemia   . Atherosclerosis of native arteries of the extremities, unspecified   . Reflux esophagitis   . Unspecified constipation   . Type II or unspecified type diabetes mellitus with neurological manifestations, uncontrolled   . Unspecified hereditary and idiopathic peripheral neuropathy   . Urinary tract infection, site not specified   . Muscle weakness (generalized)   . Acute sinusitis, unspecified   . Herpes zoster without mention of complication   . 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 Type II or unspecified type diabetes mellitus without mention of complication, not stated as uncontrolled   . Other and unspecified hyperlipidemia   . Anxiety state, unspecified   . Depressive disorder, not elsewhere classified   . Unspecified essential hypertension   . Atrial fibrillation   . Chronic airway obstruction, not elsewhere classified   . Osteoarthrosis, unspecified whether generalized or localized, unspecified site   . Lumbago   . Hip pain, chronic 09/17/2014   Past Surgical History  Procedure Laterality Date  . Lung lobectomy  1986    left  . Septoplasty    . Total hip arthroplasty  01/2010    left  . Bronchoscopy  02/2011  . Dilation and curettage of uterus  1980's  . Tubal ligation  1980's  . Cardiac  catheterization    . Fetal blood transfusion  09/16/11  . Joint replacement  04/13/10    Left Hip  . Cataract extraction  03/12/2013    Left Eye    Social History:   reports that she quit smoking about 17 years ago. Her smoking use included Cigarettes. She has a 60 pack-year smoking history. She has never used smokeless tobacco. She reports that she does not drink alcohol or use illicit drugs.  Family History  Problem  Relation Age of Onset  . Bone cancer Father   . Cancer Father   . Diabetes Mother   . Deep vein thrombosis Mother   . Heart disease Mother     Heart Disease before age 35  . Hyperlipidemia Mother   . Hypertension Mother   . Arthritis Daughter     Medications: Patient's Medications  New Prescriptions   No medications on file  Previous Medications   ALBUTEROL-IPRATROPIUM (COMBIVENT) 18-103 MCG/ACT INHALER    every 4 (four) hours as needed. 2 puffs every for hours for lung cancer   ALPRAZOLAM (XANAX) 1 MG TABLET    TAKE 1 TABLET BY MOUTH THREE TIMES A DAY AS NEEDED FOR ANXIETY   AMBULATORY NON FORMULARY MEDICATION    Lighter Weight Rollator with Padded Seat and Hand Brakes Dx: 496, 729.1, 722.52   ASPIRIN EC 325 MG TABLET    Take 325 mg by mouth daily.   CALCIUM CITRATE-VITAMIN D (CITRACAL + D PO)    Take 2 tablets by mouth daily.   CETIRIZINE-PSEUDOEPHEDRINE (ZYRTEC-D) 5-120 MG PER TABLET    One twice daily to help allergy and congestion   CITALOPRAM (CELEXA) 20 MG TABLET    TAKE 1 TABLET BY MOUTH EVERY DAY TO HELP MOOD   DILTIAZEM (CARDIZEM CD) 240 MG 24 HR CAPSULE    TAKE 1 CAPSULE BY MOUTH EVERY DAY FOR BLOOD PRESSURE   FEFUM-FEPOLY-FA-B CMP-C-BIOT (INTEGRA PLUS) CAPS    Take 1 capsule by mouth daily.   FLUTICASONE (FLONASE) 50 MCG/ACT NASAL SPRAY    INSTILL 2 SPRAYS INTO BOTH NOSTRILS  TWICE DAILY   GEMFIBROZIL (LOPID) 600 MG TABLET    TAKE 1 TABLET BY MOUTH ONCE DAILY   GLUCOSE BLOOD TEST STRIP    Check blood sugar once daily  DX: 250.00    HYDROCODONE-ACETAMINOPHEN (NORCO) 10-325 MG PER TABLET    One every 6 hours if needed for pain   LOSARTAN (COZAAR) 50 MG TABLET    TAKE 1 TABLET BY MOUTH TWICE DAILY   METFORMIN (GLUCOPHAGE) 500 MG TABLET    TAKE 1 TABLET BY MOUTH 2 TIMES A DAY   METOCLOPRAMIDE (REGLAN) 5 MG TABLET    TAKE 1 TABLET BY MOUTH 4 TIMES A DAY   MULTIPLE VITAMINS-MINERALS (CENTRUM SILVER PO)    Take 1 tablet by mouth daily.   NON FORMULARY    Place 3 L into the nose daily. Oxygen   POTASSIUM CHLORIDE (K-DUR,KLOR-CON) 10 MEQ TABLET    TAKE 1 TABLET BY MOUTH EVERY DAY   RANITIDINE (ZANTAC) 300 MG TABLET    Take 1 tablet (300 mg total) by mouth at bedtime.   TRAMADOL (ULTRAM) 50 MG TABLET    TAKE ONE TABLET BY MOUTH 3 TIMES DAILY AS NEEDED FOR PAIN.   TRIAMTERENE-HYDROCHLOROTHIAZIDE (MAXZIDE-25) 37.5-25 MG PER TABLET    TAKE 1 TABLET BY MOUTH EVERY DAY TO CONTROL BLOOD PRESSURE  Modified Medications   Modified Medication Previous Medication   LIDOCAINE (LIDODERM) 5 % lidocaine (LIDODERM) 5 %      Place 1 patch onto the skin daily as needed. For shoulder pain.Remove & Discard patch within 12 hours or as directed by MD.    Place 1 patch onto the skin daily as needed. For shoulder pain.Remove & Discard patch within 12 hours or as directed by MD.   LIDOCAINE (LIDODERM) 5 % lidocaine (LIDODERM) 5 %      Place 1 patch onto the skin daily as needed. For shoulder pain.Remove & Discard patch  within 12 hours or as directed by MD.    Place 1 patch onto the skin daily as needed. For shoulder pain.Remove & Discard patch within 12 hours or as directed by MD.  Discontinued Medications   SIMETHICONE (GAS-X EXTRA STRENGTH PO)    Take 1 tablet by mouth at bedtime as needed. As needed for gas.   TRIAMCINOLONE OINTMENT (KENALOG) 0.1 %    APPLY TOPICALLY TWICE DAILY   UMECLIDINIUM-VILANTEROL (ANORO ELLIPTA) 62.5-25 MCG/INH AEPB    Inhale into the lungs daily.     Physical Exam:  Filed Vitals:   10/08/14 1439  BP: 128/76  Pulse: 86    Temp: 98.3 F (36.8 C)  TempSrc: Oral  Resp: 18  Height: '5\' 6"'$  (1.676 m)  Weight: 170 lb (77.111 kg)  SpO2: 98%    Physical Exam  Constitutional: She is oriented to person, place, and time. She appears well-developed and well-nourished. No distress.  HENT:  Head: Normocephalic and atraumatic.  Right Ear: External ear normal.  Left Ear: External ear normal.  Nose: Nose normal.  Mouth/Throat: Oropharynx is clear and moist. No oropharyngeal exudate.  Eyes: Conjunctivae are normal. Pupils are equal, round, and reactive to light.  Neck: Normal range of motion. Neck supple.  Cardiovascular: Normal rate, regular rhythm and normal heart sounds.   Pulmonary/Chest: Effort normal and breath sounds normal.  Abdominal: Soft. Bowel sounds are normal.  Musculoskeletal: She exhibits no edema or tenderness.  Neurological: She is alert and oriented to person, place, and time.  Skin: Skin is warm and dry. No rash noted. She is not diaphoretic. No erythema. No pallor.  Psychiatric: She has a normal mood and affect.    Labs reviewed: Basic Metabolic Panel:  Recent Labs  12/18/13 1122 01/25/14 1308 03/12/14 1737 07/29/14 1332  NA 143 139 142 140  K 4.4 4.1 3.9 4.1  CL 98  --  97  --   CO2 '26 25 27 22  '$ GLUCOSE 123* 157* 98 200*  BUN 12 25.3 15 16.8  CREATININE 1.28* 1.3* 1.10* 1.3*  CALCIUM 9.7 11.1* 10.5* 10.0   Liver Function Tests:  Recent Labs  01/25/14 1308 03/12/14 1737 07/29/14 1332  AST '16 24 14  '$ ALT '16 15 10  '$ ALKPHOS 97 86 83  BILITOT 0.28 0.3 0.26  PROT 7.1 6.3 7.0  ALBUMIN 3.9  --  4.0   No results for input(s): LIPASE, AMYLASE in the last 8760 hours. No results for input(s): AMMONIA in the last 8760 hours. CBC:  Recent Labs  01/25/14 1308 03/12/14 1737 07/23/14 1441 07/29/14 1332  WBC 9.1 9.3 8.1 8.6  NEUTROABS 7.9* 6.0 4.9 7.5*  HGB 11.8 11.0* 11.7 11.9  HCT 34.7* 33.2* 35.7 36.1  MCV 88.3 90 91 89.7  PLT 245  --  258 267   Lipid Panel:  Recent  Labs  12/18/13 1122  CHOL 207*  HDL 63  LDLCALC 110*  TRIG 168*  CHOLHDL 3.3   TSH: No results for input(s): TSH in the last 8760 hours. A1C: Lab Results  Component Value Date   HGBA1C 5.7* 12/18/2013     Assessment/Plan 1. Thrush, oral -tongue with faint white coating, will retreat with mouthwash TID  -to take diflucan once antibiotics are complete - fluconazole (DIFLUCAN) 150 MG tablet; Take 1 tablet (150 mg total) by mouth once.  Dispense: 1 tablet; Refill: 0 - Diphenhyd-Hydrocort-Nystatin (FIRST-DUKES MOUTHWASH) SUSP; Use as directed 5 mLs in the mouth or throat 3 (three) times daily.  Dispense: 237  mL; Refill: 0   2. Sinusitis, unspecified chronicity, unspecified location - amoxicillin-clavulanate (AUGMENTIN) 875-125 MG per tablet; Take 1 tablet by mouth 2 (two) times daily.  Dispense: 20 tablet; Refill: 0  3. Dry skin To use Eucerin lotion to affected area twice daily    Mohammad Granade K. Harle Battiest  [facility] [phone] 8 am - 5 pm) [phone] (after hours)

## 2014-10-09 ENCOUNTER — Other Ambulatory Visit: Payer: Self-pay | Admitting: *Deleted

## 2014-10-09 ENCOUNTER — Telehealth: Payer: Self-pay

## 2014-10-09 MED ORDER — FREESTYLE LANCETS MISC: Status: DC

## 2014-10-09 MED ORDER — GLUCOSE BLOOD VI STRP: ORAL_STRIP | Status: DC

## 2014-10-09 NOTE — Telephone Encounter (Signed)
Yesterday patient was in office and requested refill on DM testing supplies (Lancets & Test Strips). I asked patient to verify name of supplies, patient was unsure and asked for me to call her previous pharmacy ([facility]).   I called [facility] and ended call due to long wait on hold. I called again this morning, spoke with [facility]. Patient never had DM testing supplies filled at this pharmacy.  I called patient and left message on voicemail for patient to return call when available. Reason for call- Patient needs to look at DM supplies at home and give name. RX's then to be sent to pharmacy of choice (need to verify 2 pharmacies on file)

## 2014-10-09 NOTE — Telephone Encounter (Signed)
Patient requested 

## 2014-10-10 ENCOUNTER — Other Ambulatory Visit: Payer: Self-pay | Admitting: *Deleted

## 2014-10-10 MED ORDER — GLUCOSE BLOOD VI STRP: ORAL_STRIP | Status: DC

## 2014-10-10 MED ORDER — FREESTYLE LANCETS MISC: Status: DC

## 2014-10-10 NOTE — Telephone Encounter (Signed)
Received [facility] Diabetic Form. Filled out and given to Janett Billow to review and sign. Refaxed Rx's to pharmacy.

## 2014-10-17 ENCOUNTER — Other Ambulatory Visit: Payer: Self-pay | Admitting: *Deleted

## 2014-10-17 DIAGNOSIS — M5136 Other intervertebral disc degeneration, lumbar region: Secondary | ICD-10-CM

## 2014-10-17 DIAGNOSIS — G44229 Chronic tension-type headache, not intractable: Secondary | ICD-10-CM

## 2014-10-17 DIAGNOSIS — G8929 Other chronic pain: Secondary | ICD-10-CM

## 2014-10-17 DIAGNOSIS — M25552 Pain in left hip: Secondary | ICD-10-CM

## 2014-10-17 MED ORDER — HYDROCODONE-ACETAMINOPHEN 10-325 MG PO TABS: ORAL_TABLET | ORAL | Status: DC

## 2014-10-23 ENCOUNTER — Other Ambulatory Visit: Payer: Self-pay | Admitting: Internal Medicine

## 2014-10-23 ENCOUNTER — Other Ambulatory Visit: Payer: Self-pay | Admitting: Nurse Practitioner

## 2014-10-25 ENCOUNTER — Encounter: Payer: Self-pay | Admitting: Family

## 2014-10-28 ENCOUNTER — Telehealth: Payer: Self-pay | Admitting: *Deleted

## 2014-10-28 NOTE — Telephone Encounter (Signed)
Patient called and wants a Rx written for a  Raised Commode Seat with Rail due to her back. Please Advise.

## 2014-10-29 ENCOUNTER — Other Ambulatory Visit: Payer: Self-pay | Admitting: *Deleted

## 2014-10-29 ENCOUNTER — Ambulatory Visit (INDEPENDENT_AMBULATORY_CARE_PROVIDER_SITE_OTHER): Payer: Medicare Other | Admitting: Family

## 2014-10-29 ENCOUNTER — Encounter: Payer: Self-pay | Admitting: Family

## 2014-10-29 ENCOUNTER — Ambulatory Visit (HOSPITAL_COMMUNITY)
Admission: RE | Admit: 2014-10-29 | Discharge: 2014-10-29 | Disposition: A | Discharge status: Home / Self Care | Payer: Medicare Other | Source: Ambulatory Visit | Attending: Family | Admitting: Family

## 2014-10-29 VITALS — BP 108/48 | HR 80 | Temp 98.3°F | Resp 16 | Ht 66.0 in | Wt 172.0 lb

## 2014-10-29 DIAGNOSIS — E119 Type 2 diabetes mellitus without complications: Secondary | ICD-10-CM | POA: Diagnosis not present

## 2014-10-29 DIAGNOSIS — Z87891 Personal history of nicotine dependence: Secondary | ICD-10-CM | POA: Diagnosis not present

## 2014-10-29 DIAGNOSIS — M79652 Pain in left thigh: Secondary | ICD-10-CM | POA: Insufficient documentation

## 2014-10-29 DIAGNOSIS — E785 Hyperlipidemia, unspecified: Secondary | ICD-10-CM | POA: Insufficient documentation

## 2014-10-29 DIAGNOSIS — I70219 Atherosclerosis of native arteries of extremities with intermittent claudication, unspecified extremity: Secondary | ICD-10-CM | POA: Diagnosis not present

## 2014-10-29 DIAGNOSIS — I779 Disorder of arteries and arterioles, unspecified: Secondary | ICD-10-CM

## 2014-10-29 DIAGNOSIS — I10 Essential (primary) hypertension: Secondary | ICD-10-CM | POA: Insufficient documentation

## 2014-10-29 MED ORDER — LIDOCAINE 5 % EX PTCH: 1.0000 | MEDICATED_PATCH | Freq: Every day | CUTANEOUS | Status: DC | PRN

## 2014-10-29 MED ORDER — AMBULATORY NON FORMULARY MEDICATION: Status: DC

## 2014-10-29 NOTE — Progress Notes (Signed)
[facility] HISTORY AND PHYSICAL -PAD  History of Present Illness [name] is a 69 y.o. female patient of Dr. Oneida Alar who returns for followup today regarding peripheral arterial disease. She has been followed since 2011. She was originally seen and found to have mild to moderate bilateral peripheral arterial disease. She has a known left common femoral stenosis and a right common iliac stenosis. This was seen on prior CT Angio. She complains of numbness and tingling from the knee down to the foot bilaterally at times. She does not really describe claudication symptoms. However she is overall fairly inactive consistent recliner most of the day. She is on continuous home oxygen. She has now developed recurrent lung cancer and has recently had radiation and chemotherapy treatments. She also complains of some soreness behind both knees occasionally. She is also noted that she bruises easy. She denies any lower extremity swelling. She had a left total hip replacement several years ago. She does not describe rest pain in the feet. Chronic medical problems include hypertension, hyperlipidemia, coronary artery disease, lung cancer, diabetes, atrial fibrillation, COPD. These are all currently stable.  Her walking is limited by dyspnea, not claudication symptoms, denies non healing wounds. She elevates her ankles above her heart when in her recliner and does leg exercises daily while sitting. She states that she has known fibromyalgia.   The patient reports New Medical or Surgical History: gets CT scans every 4 months for lung cancer follow up Has gallstones, has occasional pain from her stomach ulcer, post prandial nausea and diarrhea at tmes.  Pt Diabetic: Yes, states in good control, last A1C result on file was Sept. 2015 at 5.7 Pt smoker: former smoker, quit in 2000  Pt meds include: Statin :yes ASA: Yes, 325 mg daily; advised to decrease to 81 mg enteric coated daily  due to her stomach ulcer Other anticoagulants/antiplatelets: no     Past Medical History  Diagnosis Date  . 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 Restless legs syndrome (RLS)   . Chronic sinusitis   . Hypertension   . Hyperlipidemia   . Anemia, unspecified   . GERD (gastroesophageal reflux disease)   . Respiratory failure with hypoxia 01/02/2010  . Complication of anesthesia     slow to wake up  . Asthma   . Coronary artery disease     Dr. Burt Knack had stress test done  . Type II or unspecified type diabetes mellitus without mention of complication, not stated as uncontrolled   . Peripheral vascular disease   . PAD (peripheral artery disease)   . Heart murmur   . Dysrhythmia     irregular  . Anginal pain   . History of blood clots     "2 in my aorta"  . Hard of hearing, right   . History of bronchitis   . Bronchial pneumonia     history  . COPD (chronic obstructive pulmonary disease)   . Emphysema   . Shortness of breath     "anytime really"  . Syncope and collapse   . Adenocarcinoma, lung     upper left lobe  . 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 Chemotherapy adverse reaction 09/15/11    "makes me light headed and pass out; this is 3rd blood transfusion since going thru it"  . History of blood transfusion   . Kidney infection     "related to chemo"  . Sinus  headache     "all the time"  . Arthritis     "all over my body"  . Fibromyalgia   . Osteoporosis   . DDD (degenerative disc disease), lumbar   . Scoliosis   . Anxiety   . Mental disorder   . Reflux   . CHF (congestive heart failure)   . DVT (deep venous thrombosis)   . Hx of radiation therapy 04/28/11 -06/08/11    right upper lobe-lung  . Acute upper respiratory infections of unspecified site   . Pain in thoracic spine   . Asymptomatic varicose veins   . Functional urinary incontinence   . Diseases of lips   . Other abnormal blood chemistry   . Effects of  radiation, unspecified   . Anemia in neoplastic disease   . Muscle weakness (generalized)   . Dizziness and giddiness   . Spasm of muscle   . Unspecified vitamin D deficiency   . Hyperpotassemia   . Acute posthemorrhagic anemia   . Unspecified hearing loss   . Disorder of bone and cartilage, unspecified   . Screening for thyroid disorder   . Malignant neoplasm of bronchus and lung, unspecified site   . Hypopotassemia   . Atherosclerosis of native arteries of the extremities, unspecified   . Reflux esophagitis   . Unspecified constipation   . Type II or unspecified type diabetes mellitus with neurological manifestations, uncontrolled   . Unspecified hereditary and idiopathic peripheral neuropathy   . Urinary tract infection, site not specified   . Muscle weakness (generalized)   . Acute sinusitis, unspecified   . Herpes zoster without mention of complication   . 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 Type II or unspecified type diabetes mellitus without mention of complication, not stated as uncontrolled   . Other and unspecified hyperlipidemia   . Anxiety state, unspecified   . Depressive disorder, not elsewhere classified   . Unspecified essential hypertension   . Atrial fibrillation   . Chronic airway obstruction, not elsewhere classified   . Osteoarthrosis, unspecified whether generalized or localized, unspecified site   . Lumbago   . Hip pain, chronic 09/17/2014    Social History History  Substance Use Topics  . Smoking status: Former Smoker -- 1.50 packs/day for 40 years    Types: Cigarettes    Quit date: 02/10/1997  . Smokeless tobacco: Never Used  . Alcohol Use: No    Family History Family History  Problem Relation Age of Onset  . Bone cancer Father   . Cancer Father     Bone  . Diabetes Mother   . Deep vein thrombosis Mother   . Heart disease Mother     Heart Disease before age 41-  PVD  . Hyperlipidemia Mother   . Hypertension Mother   . Varicose  Veins Mother   . Arthritis Daughter     Past Surgical History  Procedure Laterality Date  . Lung lobectomy  1986    left  . Septoplasty    . Total hip arthroplasty  01/2010    left  . Bronchoscopy  02/2011  . Dilation and curettage of uterus  1980's  . Tubal ligation  1980's  . Cardiac catheterization    . Fetal blood transfusion  09/16/11  . Joint replacement  04/13/10    Left Hip  . Cataract extraction  03/12/2013    Left Eye   . Eye surgery Left 2014    Cataract  Allergies  Allergen Reactions  . Iohexol Shortness Of Breath and Other (See Comments)    "burns me up inside"    Pt does fine with 13 hour prep  01/17/12  . Other     CAN'T STAND THE SMELL OF PURE BLEACH; "HAVE TO BACK UP AND POUR AND DON'T BREATH IT TIL i GET CAP BACK ON; DILUTE IT TO SPRAY"  . Anoro Ellipta [Umeclidinium-Vilanterol] Swelling    Swelling of lips and mouth  . Codeine Itching and Nausea And Vomiting    REACTION: GI upset  . Protonix [Pantoprazole Sodium] Diarrhea    Current Outpatient Prescriptions  Medication Sig Dispense Refill  . albuterol-ipratropium (COMBIVENT) 18-103 MCG/ACT inhaler every 4 (four) hours as needed. 2 puffs every for hours for lung cancer    . ALPRAZolam (XANAX) 1 MG tablet TAKE 1 TABLET BY MOUTH 3 TIMES A DAY AS NEEDED FOR ANXIETY 90 tablet 0  . AMBULATORY NON FORMULARY MEDICATION Lighter Weight Rollator with Padded Seat and Hand Brakes Dx: 496, 729.1, 722.52 1 each 0  . AMBULATORY NON FORMULARY MEDICATION Raised Commode Seat with Rail Dx: M51.36, M54.5 (Patient not taking: Reported on 10/29/2014) 1 each 0  . amoxicillin-clavulanate (AUGMENTIN) 875-125 MG per tablet Take 1 tablet by mouth 2 (two) times daily. (Patient not taking: Reported on 10/29/2014) 20 tablet 0  . aspirin EC 325 MG tablet Take 325 mg by mouth daily.    . Calcium Citrate-Vitamin D (CITRACAL + D PO) Take 2 tablets by mouth daily.    . cetirizine-pseudoephedrine (ZYRTEC-D) 5-120 MG per tablet One twice daily  to help allergy and congestion 60 tablet 5  . citalopram (CELEXA) 20 MG tablet TAKE 1 TABLET BY MOUTH EVERY DAY TO HELP MOOD 90 tablet 1  . COMBIVENT RESPIMAT 20-100 MCG/ACT AERS respimat     . diltiazem (CARDIZEM CD) 240 MG 24 hr capsule TAKE 1 CAPSULE BY MOUTH EVERY DAY FOR BLOOD PRESSURE 90 capsule 1  . Diphenhyd-Hydrocort-Nystatin (FIRST-DUKES MOUTHWASH) SUSP Use as directed 5 mLs in the mouth or throat 3 (three) times daily. 237 mL 0  . FeFum-FePoly-FA-B Cmp-C-Biot (INTEGRA PLUS) CAPS Take 1 capsule by mouth daily.  3  . fluconazole (DIFLUCAN) 150 MG tablet Take 1 tablet (150 mg total) by mouth once. 1 tablet 0  . fluticasone (FLONASE) 50 MCG/ACT nasal spray INSTILL 2 SPRAYS INTO BOTH NOSTRILS  TWICE DAILY 16 g 2  . gemfibrozil (LOPID) 600 MG tablet TAKE 1 TABLET BY MOUTH ONCE DAILY 30 tablet 5  . glucose blood (FREESTYLE LITE) test strip Use to test blood sugar three times daily. Dx: E11.9, E11.49 100 each 12  . glucose blood test strip Check blood sugar once daily  DX: 250.00    . HYDROcodone-acetaminophen (NORCO) 10-325 MG per tablet One every 6 hours if needed for pain 120 tablet 0  . Lancets (FREESTYLE) lancets Use to test blood sugar three times daily. Dx: E11.9, E11.49 100 each 12  . lidocaine (LIDODERM) 5 % Place 1 patch onto the skin daily as needed. For shoulder pain.Remove & Discard patch within 12 hours or as directed by MD. 30 patch 0  . lidocaine (LIDODERM) 5 % Place 1 patch onto the skin daily as needed. For shoulder pain.Remove & Discard patch within 12 hours or as directed by MD. (Patient not taking: Reported on 10/29/2014) 30 patch 0  . losartan (COZAAR) 50 MG tablet TAKE 1 TABLET BY MOUTH TWICE DAILY (Patient not taking: Reported on 10/29/2014) 60 tablet 4  .  metFORMIN (GLUCOPHAGE) 500 MG tablet TAKE 1 TABLET BY MOUTH 2 TIMES A DAY 60 tablet 5  . metoCLOPramide (REGLAN) 5 MG tablet TAKE 1 TABLET BY MOUTH 4 TIMES A DAY 120 tablet 1  . Multiple Vitamins-Minerals (CENTRUM SILVER  PO) Take 1 tablet by mouth daily.    . NON FORMULARY Place 3 L into the nose daily. Oxygen    . nystatin (MYCOSTATIN) 100000 UNIT/ML suspension     . potassium chloride (K-DUR,KLOR-CON) 10 MEQ tablet TAKE 1 TABLET BY MOUTH EVERY DAY 30 tablet 1  . ranitidine (ZANTAC) 300 MG tablet Take 1 tablet (300 mg total) by mouth at bedtime. 30 tablet 5  . traMADol (ULTRAM) 50 MG tablet TAKE 1 TABLET BY MOUTH THREE TIMES A DAY AS NEEDED FOR PAIN 90 tablet 0  . triamterene-hydrochlorothiazide (MAXZIDE-25) 37.5-25 MG per tablet TAKE 1 TABLET BY MOUTH EVERY DAY TO CONTROL BLOOD PRESSURE 90 tablet 1  . [DISCONTINUED] calcium carbonate 200 MG capsule Take 250 mg by mouth daily. Does not take everyday     No current facility-administered medications for this visit.   Facility-Administered Medications Ordered in Other Visits  Medication Dose Route Frequency Provider Last Rate Last Dose  . hyaluronate sodium (RADIAPLEXRX) gel   Topical Once Thea Silversmith, MD      . topical emolient (BIAFINE) emulsion   Topical PRN Thea Silversmith, MD        ROS: See HPI for pertinent positives and negatives.   Physical Examination  Filed Vitals:   10/29/14 1024  BP: 108/48  Pulse: 80  Temp: 98.3 F (36.8 C)  TempSrc: Oral  Resp: 16  Height: '5\' 6"'$  (1.676 m)  Weight: 172 lb (78.019 kg)  SpO2: 97%   Body mass index is 27.77 kg/(m^2).  General: A&O x 3, WDWN. Gait: using cane Eyes: pupils equal Pulmonary: CTAB, without wheezes , rales or rhonchi. Cardiac: regular Rhythm, no detected murmur.     Carotid Bruits Left Right   Negative Negative  Aorta is not palpable. Radial pulses: are 2+ palpable and =   VASCULAR EXAM: Extremities without ischemic changes  without Gangrene; without open wounds.     LE Pulses LEFT RIGHT    FEMORAL not palpable 2+ palpable    POPLITEAL not palpable  not palpable   POSTERIOR TIBIAL not palpable  not palpable    DORSALIS PEDIS  ANTERIOR TIBIAL 1+palpable  faintly palpable    Abdomen: soft, NT, no palpable masses. Skin: no rashes, no ulcers. Musculoskeletal: no muscle wasting or atrophy. trace non pitting edema in lower legs and ankles.Tender to touch in multiple areas palpated.  Neurologic: A&O X 3; Appropriate Affect,; MOTOR FUNCTION: moving all extremities equally, motor strength 4/5 throughout. Speech is fluent/normal. CN 2-12 grossly intact.         Non-Invasive Vascular Imaging: DATE: 10/29/2014 ABI: RIGHT: 1.1 (0.96, 10/25/13), Waveforms: triphasic, TBI: 0.54 (0.47) ;  LEFT: 1.0 (0.93), Waveforms: triphasic; TBI: 0.54 (0.45)   ASSESSMENT: [name] is a 69 y.o. female who presents for follow up of her mild peripheral artery disease. She has a known left common femoral stenosis and a right common iliac stenosis. This was seen on prior CT Angio. She does not seem to describe claudication symptoms, she has no signs of ischemia or tissue loss in her lower extremities.   ABI's today are normal with all triphasic waveforms; TBI's are below normal but improved from a year ago. She has been performing seated leg exercises on a regular basis  which is the likely reason for the improvement in perfusion.  She has issues with falling.  Pt advised to ask her PCP for consideration of referral to a rheumatologist to address generalized tenderness which is her primary complaint, she has known fibromyalgia.    PLAN:  Continue seated daily leg exercises as discussed and demonstrated, she has fallen a few times, graduated walking program is not adivsed.  Based on the patient's vascular studies and examination, pt will return to clinic in 1 year with ABI's.  I discussed in depth with the patient the nature of atherosclerosis, and  emphasized the importance of maximal medical management including strict control of blood pressure, blood glucose, and lipid levels, obtaining regular exercise, and continued cessation of smoking.  The patient is aware that without maximal medical management the underlying atherosclerotic disease process will progress, limiting the benefit of any interventions.  The patient was given information about PAD including signs, symptoms, treatment, what symptoms should prompt the patient to seek immediate medical care, and risk reduction measures to take.  Clemon Chambers, RN, MSN, FNP-C [facility] Phone: [phone]  Clinic MD: Early  10/29/2014 10:50 AM

## 2014-10-29 NOTE — Telephone Encounter (Signed)
This is an appropriate Rx for her. I will be in the office this afternoon.

## 2014-10-29 NOTE — Telephone Encounter (Signed)
Called and spoke with Amy at [facility] and patient is due for refill. Phoned into pharmacy.

## 2014-10-29 NOTE — Patient Instructions (Signed)

## 2014-10-29 NOTE — Telephone Encounter (Signed)
Rx Printed and patient notified to pick up after 2 for Dr. Nyoka Cowden to sign.

## 2014-10-30 ENCOUNTER — Other Ambulatory Visit: Payer: Self-pay | Admitting: *Deleted

## 2014-10-30 MED ORDER — LIDOCAINE 5 % EX PTCH: 1.0000 | MEDICATED_PATCH | Freq: Every day | CUTANEOUS | Status: DC | PRN

## 2014-10-30 NOTE — Telephone Encounter (Signed)
Patient called and left message that pharmacy did not receive her Lidoderm Patch Rx. Resent to pharmacy. Tried calling patient and just rings.

## 2014-10-30 NOTE — Addendum Note (Signed)
Addended by: Dorthula Rue L on: 10/30/2014 04:39 PM   Modules accepted: Orders

## 2014-10-31 DIAGNOSIS — E119 Type 2 diabetes mellitus without complications: Secondary | ICD-10-CM | POA: Diagnosis not present

## 2014-10-31 DIAGNOSIS — H11159 Pinguecula, unspecified eye: Secondary | ICD-10-CM | POA: Diagnosis not present

## 2014-10-31 DIAGNOSIS — Z961 Presence of intraocular lens: Secondary | ICD-10-CM | POA: Diagnosis not present

## 2014-10-31 LAB — HM DIABETES EYE EXAM

## 2014-11-04 ENCOUNTER — Telehealth: Payer: Self-pay | Admitting: *Deleted

## 2014-11-04 ENCOUNTER — Telehealth: Payer: Self-pay | Admitting: Cardiovascular Disease

## 2014-11-04 ENCOUNTER — Encounter: Payer: Self-pay | Admitting: *Deleted

## 2014-11-04 NOTE — Telephone Encounter (Signed)
Received [facility] [address] forms in the mail from [place] [address] # [phone] Fax: [phone] Forms are for [employer] To be mailed back once completed. Given to Dr. Nyoka Cowden to review and sign.

## 2014-11-04 NOTE — Telephone Encounter (Signed)
I spoke with the pt and she complains of 2 small knots in her left forearm. The knots hurt and she thinks that one of the knots has been present for 1 year.  The pt also complains of aches and pains in her shoulders and arms. The pt said the knots are located in an area where she has had blood drawn in the past.  I made the pt aware that the veins do have valves in them and they can feel like a knot to a pt.  Per [facility] note they advised the pt to contact her PCP for referral to Rheumatology.  At this time the pt has not called her PCP.  I advised her to follow the recommended plan from [facility] and keep scheduled August appointment with Dr Burt Knack. Pt agreed with plan.

## 2014-11-04 NOTE — Telephone Encounter (Signed)
New message     Pt arms and hand are hurting very bad and hands feel limp. Pt went to vascular doctor on July 19th Pt states she was told to continue taking aspirin '325mg'$  Please call to discuss

## 2014-11-05 NOTE — Telephone Encounter (Signed)
Copied and sent for scan. Mailed Forms to [employer]

## 2014-11-06 ENCOUNTER — Telehealth: Payer: Self-pay

## 2014-11-06 NOTE — Telephone Encounter (Signed)
I should discuss this with her at her next visit

## 2014-11-06 NOTE — Telephone Encounter (Signed)
Patient was seen by Vascular Vein specialist and they recommended patient to see specialist for fibromyalgia pains. Patient with pain and swelling in both arms. Patient also with blockages in leg causing pain  (report from [facility]in and Vascular is scanned in under media). Patient will need Dr.Green to place referral to the appropriate specialist   Dr.Green please advise

## 2014-11-07 NOTE — Telephone Encounter (Signed)
Patient notified and stated that she will call back and make sooner appointment.

## 2014-11-12 ENCOUNTER — Encounter: Payer: Self-pay | Admitting: Internal Medicine

## 2014-11-12 ENCOUNTER — Ambulatory Visit (INDEPENDENT_AMBULATORY_CARE_PROVIDER_SITE_OTHER): Payer: Medicare Other | Admitting: Internal Medicine

## 2014-11-12 VITALS — BP 128/52 | HR 103 | Temp 99.0°F | Resp 20 | Ht 66.0 in | Wt 170.0 lb

## 2014-11-12 DIAGNOSIS — G44229 Chronic tension-type headache, not intractable: Secondary | ICD-10-CM

## 2014-11-12 DIAGNOSIS — G8929 Other chronic pain: Secondary | ICD-10-CM

## 2014-11-12 DIAGNOSIS — J449 Chronic obstructive pulmonary disease, unspecified: Secondary | ICD-10-CM | POA: Diagnosis not present

## 2014-11-12 DIAGNOSIS — M25552 Pain in left hip: Secondary | ICD-10-CM

## 2014-11-12 DIAGNOSIS — M791 Myalgia: Secondary | ICD-10-CM | POA: Diagnosis not present

## 2014-11-12 DIAGNOSIS — I779 Disorder of arteries and arterioles, unspecified: Secondary | ICD-10-CM | POA: Diagnosis not present

## 2014-11-12 DIAGNOSIS — M7918 Myalgia, other site: Secondary | ICD-10-CM | POA: Insufficient documentation

## 2014-11-12 DIAGNOSIS — M5136 Other intervertebral disc degeneration, lumbar region: Secondary | ICD-10-CM

## 2014-11-12 MED ORDER — MELOXICAM 15 MG PO TABS: ORAL_TABLET | ORAL | Status: DC

## 2014-11-12 MED ORDER — HYDROCODONE-ACETAMINOPHEN 10-325 MG PO TABS: ORAL_TABLET | ORAL | Status: DC

## 2014-11-12 NOTE — Progress Notes (Signed)
Patient ID: [name], female   DOB: 1945-05-06, 69 y.o.   MRN: [id number]    Facility  [facility]    Place of Service:   OFFICE    Allergies  Allergen Reactions  . Iohexol Shortness Of Breath and Other (See Comments)    "burns me up inside"    Pt does fine with 13 hour prep  01/17/12  . Other     CAN'T STAND THE SMELL OF PURE BLEACH; "HAVE TO BACK UP AND POUR AND DON'T BREATH IT TIL i GET CAP BACK ON; DILUTE IT TO SPRAY"  . Anoro Ellipta [Umeclidinium-Vilanterol] Swelling    Swelling of lips and mouth  . Codeine Itching and Nausea And Vomiting    REACTION: GI upset  . Protonix [Pantoprazole Sodium] Diarrhea    Chief Complaint  Patient presents with  . Acute Visit    has knots in her arms that are sore  . Immunizations    discuss prevnar 13 and tdap    HPI:  Multiple lipomas and probable lumpy fascial ares on the arms.  1. Myofascial pain Pain in multiple muscles and joints.  2. Chronic obstructive pulmonary disease, unspecified COPD, unspecified chronic bronchitis type Oxygen dependent. Coughing.   Medications: Patient's Medications  New Prescriptions   No medications on file  Previous Medications   ALBUTEROL-IPRATROPIUM (COMBIVENT) 18-103 MCG/ACT INHALER    every 4 (four) hours as needed. 2 puffs every for hours for lung cancer   ALPRAZOLAM (XANAX) 1 MG TABLET    TAKE 1 TABLET BY MOUTH 3 TIMES A DAY AS NEEDED FOR ANXIETY   AMBULATORY NON FORMULARY MEDICATION    Lighter Weight Rollator with Padded Seat and Hand Brakes Dx: 496, 729.1, 722.52   AMBULATORY NON FORMULARY MEDICATION    Raised Commode Seat with Rail Dx: M51.36, M54.5   ASPIRIN EC 325 MG TABLET    Take 325 mg by mouth daily.   CALCIUM CITRATE-VITAMIN D (CITRACAL + D PO)    Take 2 tablets by mouth daily.   CETIRIZINE-PSEUDOEPHEDRINE (ZYRTEC-D) 5-120 MG PER TABLET    One twice daily to help allergy and congestion   CITALOPRAM (CELEXA) 20 MG TABLET    TAKE 1 TABLET BY MOUTH EVERY DAY TO HELP MOOD   COMBIVENT RESPIMAT 20-100 MCG/ACT AERS RESPIMAT       DILTIAZEM (CARDIZEM CD) 240 MG 24 HR CAPSULE    TAKE 1 CAPSULE BY MOUTH EVERY DAY FOR BLOOD PRESSURE   DIPHENHYD-HYDROCORT-NYSTATIN (FIRST-DUKES MOUTHWASH) SUSP    Use as directed 5 mLs in the mouth or throat 3 (three) times daily.   FEFUM-FEPOLY-FA-B CMP-C-BIOT (INTEGRA PLUS) CAPS    Take 1 capsule by mouth daily.   FLUCONAZOLE (DIFLUCAN) 150 MG TABLET    Take 1 tablet (150 mg total) by mouth once.   FLUTICASONE (FLONASE) 50 MCG/ACT NASAL SPRAY    INSTILL 2 SPRAYS INTO BOTH NOSTRILS  TWICE DAILY   GEMFIBROZIL (LOPID) 600 MG TABLET    TAKE 1 TABLET BY MOUTH ONCE DAILY   GLUCOSE BLOOD (FREESTYLE LITE) TEST STRIP    Use to test blood sugar three times daily. Dx: E11.9, E11.49   GLUCOSE BLOOD TEST STRIP    Check blood sugar once daily  DX: 250.00   HYDROCODONE-ACETAMINOPHEN (NORCO) 10-325 MG PER TABLET    One every 6 hours if needed for pain   LANCETS (FREESTYLE) LANCETS    Use to test blood sugar three times daily. Dx: E11.9, E11.49   LIDOCAINE (LIDODERM) 5 %  Place 1 patch onto the skin daily as needed. For shoulder pain.Remove & Discard patch within 12 hours or as directed by MD.   LOSARTAN (COZAAR) 50 MG TABLET    TAKE 1 TABLET BY MOUTH TWICE DAILY   METFORMIN (GLUCOPHAGE) 500 MG TABLET    TAKE 1 TABLET BY MOUTH 2 TIMES A DAY   METOCLOPRAMIDE (REGLAN) 5 MG TABLET    TAKE 1 TABLET BY MOUTH 4 TIMES A DAY   MULTIPLE VITAMINS-MINERALS (CENTRUM SILVER PO)    Take 1 tablet by mouth daily.   NON FORMULARY    Place 3 L into the nose daily. Oxygen   NYSTATIN (MYCOSTATIN) 100000 UNIT/ML SUSPENSION       POTASSIUM CHLORIDE (K-DUR,KLOR-CON) 10 MEQ TABLET    TAKE 1 TABLET BY MOUTH EVERY DAY   RANITIDINE (ZANTAC) 300 MG TABLET    Take 1 tablet (300 mg total) by mouth at bedtime.   TRAMADOL (ULTRAM) 50 MG TABLET    TAKE 1 TABLET BY MOUTH THREE TIMES A DAY AS NEEDED FOR PAIN   TRIAMTERENE-HYDROCHLOROTHIAZIDE (MAXZIDE-25) 37.5-25 MG PER TABLET    TAKE 1  TABLET BY MOUTH EVERY DAY TO CONTROL BLOOD PRESSURE  Modified Medications   No medications on file  Discontinued Medications   AMOXICILLIN-CLAVULANATE (AUGMENTIN) 875-125 MG PER TABLET    Take 1 tablet by mouth 2 (two) times daily.     Review of Systems  Constitutional: Positive for activity change. Negative for fever and chills.  HENT: Positive for postnasal drip, sinus pressure and trouble swallowing. Negative for congestion, ear pain and sore throat.        Popping noise in ears  Eyes: Negative.   Respiratory: Positive for cough and shortness of breath. Negative for choking, chest tightness, wheezing and stridor.   Cardiovascular: Negative for chest pain and palpitations.  Gastrointestinal: Positive for constipation. Negative for nausea, abdominal pain, diarrhea and abdominal distention.       Reflux frequently  Musculoskeletal: Positive for myalgias, back pain, arthralgias, gait problem and neck pain.  Skin: Negative.   Neurological: Positive for dizziness, weakness (generalized), light-headedness and headaches. Negative for tremors and syncope.  Hematological: Negative.   Psychiatric/Behavioral: Positive for sleep disturbance and dysphoric mood. Negative for hallucinations. The patient is not nervous/anxious and is not hyperactive.     Filed Vitals:   11/12/14 1539  BP: 128/52  Pulse: 103  Temp: 99 F (37.2 C)  TempSrc: Oral  Resp: 20  Height: '5\' 6"'$  (1.676 m)  Weight: 170 lb (77.111 kg)  SpO2: 98%   Body mass index is 27.45 kg/(m^2).  Physical Exam  Constitutional: She is oriented to person, place, and time.  overweight  HENT:  Nose: Nose normal.  Mouth/Throat: Oropharynx is clear and moist. No oropharyngeal exudate.  Loss of hearing.  Eyes: Conjunctivae and EOM are normal. Pupils are equal, round, and reactive to light.  Neck: No JVD present. No tracheal deviation present. No thyromegaly present.  Cardiovascular: Normal rate, regular rhythm and normal heart  sounds.  Exam reveals no gallop and no friction rub.   No murmur heard. Pulmonary/Chest: No respiratory distress. She has no wheezes. She has no rales. She exhibits no tenderness.  Abdominal: She exhibits no distension and no mass. There is no tenderness.  Musculoskeletal: Normal range of motion. She exhibits edema and tenderness.  Lymphadenopathy:    She has no cervical adenopathy.  Neurological: She is oriented to person, place, and time. No cranial nerve deficit. Coordination normal.  Skin: No  rash noted. No erythema. No pallor.  Psychiatric: Her behavior is normal. Judgment and thought content normal.     Labs reviewed: Abstract on 11/04/2014  Component Date Value Ref Range Status  . HM Diabetic Eye Exam 10/31/2014 No Retinopathy  No Retinopathy Final   Dr. Einar Gip [phone]     Assessment/Plan  1. Myofascial pain - meloxicam (MOBIC) 15 MG tablet; One daily to help arthritis  Dispense: 30 tablet; Refill: 5  2. Chronic obstructive pulmonary disease, unspecified COPD, unspecified chronic bronchitis type Continue oxygen  3. Chronic tension-type headache, not intractable - HYDROcodone-acetaminophen (NORCO) 10-325 MG per tablet; One every 6 hours if needed for pain  Dispense: 120 tablet; Refill: 0  4. Hip pain, chronic, left - HYDROcodone-acetaminophen (NORCO) 10-325 MG per tablet; One every 6 hours if needed for pain  Dispense: 120 tablet; Refill: 0  5. DDD (degenerative disc disease), lumbar - HYDROcodone-acetaminophen (NORCO) 10-325 MG per tablet; One every 6 hours if needed for pain  Dispense: 120 tablet; Refill: 0

## 2014-11-14 ENCOUNTER — Other Ambulatory Visit: Payer: Self-pay | Admitting: Nurse Practitioner

## 2014-11-14 ENCOUNTER — Other Ambulatory Visit: Payer: Self-pay | Admitting: Internal Medicine

## 2014-11-15 ENCOUNTER — Other Ambulatory Visit: Payer: Self-pay | Admitting: *Deleted

## 2014-11-15 MED ORDER — ALPRAZOLAM 1 MG PO TABS: ORAL_TABLET | ORAL | Status: DC

## 2014-11-20 ENCOUNTER — Other Ambulatory Visit: Payer: Self-pay | Admitting: Internal Medicine

## 2014-11-20 ENCOUNTER — Other Ambulatory Visit: Payer: Self-pay | Admitting: Emergency Medicine

## 2014-11-26 ENCOUNTER — Other Ambulatory Visit: Payer: Self-pay | Admitting: Medical Oncology

## 2014-11-26 ENCOUNTER — Telehealth: Payer: Self-pay | Admitting: *Deleted

## 2014-11-26 DIAGNOSIS — Z91041 Radiographic dye allergy status: Secondary | ICD-10-CM

## 2014-11-26 MED ORDER — DIPHENHYDRAMINE HCL 50 MG PO TABS: 50.0000 mg | ORAL_TABLET | Freq: Once | ORAL | Status: DC

## 2014-11-26 MED ORDER — PREDNISONE 50 MG PO TABS: ORAL_TABLET | ORAL | Status: DC

## 2014-11-26 NOTE — Telephone Encounter (Signed)
ATC to call CT/[facility] - unable to reach anyone to speak with about Pre-treatment protocol. Need to contact CT at [facility] in AM to request regimen for Pred/Benadryl per their pre-treatment protocol.

## 2014-11-26 NOTE — Telephone Encounter (Signed)
Please set up the benadryl prednisone per radiology's pre-treatment protocol. Thanks

## 2014-11-26 NOTE — Telephone Encounter (Signed)
Patient called requesting "benadryl and prednisone order be sent to [facility] on [facility]  I am alllergic to contrast and have to have these medicines by Saturday for scans on Monday at the hospital."  Observed scans are at [facility].  Added comments to appointment for CT chest to be done with and without contrast.  Order from [name] CT chest without contrast and [name] ordered CT chest with contrast.  Explained appointments to her home health nurse to be at [facility] at 1015 for lab and [facility] at 1115 for CT scan.

## 2014-11-27 ENCOUNTER — Other Ambulatory Visit: Payer: Self-pay | Admitting: *Deleted

## 2014-11-27 DIAGNOSIS — C3490 Malignant neoplasm of unspecified part of unspecified bronchus or lung: Secondary | ICD-10-CM

## 2014-11-27 NOTE — Telephone Encounter (Signed)
Well i am real confused the ct for dr rb was cancelled at [place] ct but there is an order in for dr Inda Merlin to do a ct who needs to set this up [place] or them ?

## 2014-11-27 NOTE — Telephone Encounter (Signed)
[name] notified of [place] CT cancellation.  Verbal order received and read back for CT chest without contrast due to labs and allergy to contrast.  [facility] scheduling who report this order is not in the work [employer].  New order entered.  Peggy scheduled CT chest W/O contrast at [facility] arrive at 2:15 pm and NPO four hours before CT chest.  Milagros Reap with these instructions.  Verbalizes she will not eat or drink after 10:15 am at the latest.

## 2014-11-27 NOTE — Telephone Encounter (Signed)
Called spoke with Stacy from [place]. She reports since pt is allergic to IV dye she is not able to have CT done there so will have to be done at the hospital. Per stacy PCC's will have to call and schedule this appt at the hospital and protocol can be faxed from them. PCC's please advise thanks

## 2014-11-28 ENCOUNTER — Other Ambulatory Visit: Payer: Medicare Other

## 2014-12-02 ENCOUNTER — Inpatient Hospital Stay: Admission: RE | Admit: 2014-12-02 | Payer: Medicare Other | Source: Ambulatory Visit

## 2014-12-02 ENCOUNTER — Encounter (HOSPITAL_COMMUNITY): Payer: Self-pay

## 2014-12-02 ENCOUNTER — Ambulatory Visit (HOSPITAL_COMMUNITY)
Admission: RE | Admit: 2014-12-02 | Discharge: 2014-12-02 | Disposition: A | Discharge status: Home / Self Care | Payer: Medicare Other | Source: Ambulatory Visit | Attending: Internal Medicine | Admitting: Internal Medicine

## 2014-12-02 ENCOUNTER — Other Ambulatory Visit (HOSPITAL_BASED_OUTPATIENT_CLINIC_OR_DEPARTMENT_OTHER): Payer: Medicare Other

## 2014-12-02 DIAGNOSIS — K8020 Calculus of gallbladder without cholecystitis without obstruction: Secondary | ICD-10-CM | POA: Insufficient documentation

## 2014-12-02 DIAGNOSIS — Z85118 Personal history of other malignant neoplasm of bronchus and lung: Secondary | ICD-10-CM

## 2014-12-02 DIAGNOSIS — R918 Other nonspecific abnormal finding of lung field: Secondary | ICD-10-CM | POA: Diagnosis not present

## 2014-12-02 DIAGNOSIS — I2510 Atherosclerotic heart disease of native coronary artery without angina pectoris: Secondary | ICD-10-CM | POA: Insufficient documentation

## 2014-12-02 DIAGNOSIS — D3502 Benign neoplasm of left adrenal gland: Secondary | ICD-10-CM | POA: Diagnosis not present

## 2014-12-02 DIAGNOSIS — C3490 Malignant neoplasm of unspecified part of unspecified bronchus or lung: Secondary | ICD-10-CM | POA: Diagnosis not present

## 2014-12-02 DIAGNOSIS — Z902 Acquired absence of lung [part of]: Secondary | ICD-10-CM | POA: Insufficient documentation

## 2014-12-02 DIAGNOSIS — J90 Pleural effusion, not elsewhere classified: Secondary | ICD-10-CM | POA: Insufficient documentation

## 2014-12-02 DIAGNOSIS — D3501 Benign neoplasm of right adrenal gland: Secondary | ICD-10-CM | POA: Insufficient documentation

## 2014-12-02 DIAGNOSIS — I700 Atherosclerosis of aorta: Secondary | ICD-10-CM | POA: Diagnosis not present

## 2014-12-02 DIAGNOSIS — C3491 Malignant neoplasm of unspecified part of right bronchus or lung: Secondary | ICD-10-CM

## 2014-12-02 LAB — CBC WITH DIFFERENTIAL/PLATELET
BASO%: 0.5 % (ref 0.0–2.0)
BASOS ABS: 0 10*3/uL (ref 0.0–0.1)
EOS%: 3.1 % (ref 0.0–7.0)
Eosinophils Absolute: 0.2 10*3/uL (ref 0.0–0.5)
HEMATOCRIT: 33.2 % — AB (ref 34.8–46.6)
HGB: 11.1 g/dL — ABNORMAL LOW (ref 11.6–15.9)
LYMPH#: 1.6 10*3/uL (ref 0.9–3.3)
LYMPH%: 22.1 % (ref 14.0–49.7)
MCH: 30.2 pg (ref 25.1–34.0)
MCHC: 33.5 g/dL (ref 31.5–36.0)
MCV: 90.2 fL (ref 79.5–101.0)
MONO#: 0.6 10*3/uL (ref 0.1–0.9)
MONO%: 8.2 % (ref 0.0–14.0)
NEUT#: 4.7 10*3/uL (ref 1.5–6.5)
NEUT%: 66.1 % (ref 38.4–76.8)
Platelets: 319 10*3/uL (ref 145–400)
RBC: 3.68 10*6/uL — AB (ref 3.70–5.45)
RDW: 12.8 % (ref 11.2–14.5)
WBC: 7.2 10*3/uL (ref 3.9–10.3)

## 2014-12-02 LAB — COMPREHENSIVE METABOLIC PANEL (CC13)
ALBUMIN: 3.8 g/dL (ref 3.5–5.0)
ALK PHOS: 101 U/L (ref 40–150)
ALT: 12 U/L (ref 0–55)
AST: 18 U/L (ref 5–34)
Anion Gap: 11 mEq/L (ref 3–11)
BILIRUBIN TOTAL: 0.39 mg/dL (ref 0.20–1.20)
BUN: 15.9 mg/dL (ref 7.0–26.0)
CO2: 26 mEq/L (ref 22–29)
CREATININE: 1.1 mg/dL (ref 0.6–1.1)
Calcium: 10.1 mg/dL (ref 8.4–10.4)
Chloride: 105 mEq/L (ref 98–109)
EGFR: 51 mL/min/{1.73_m2} — AB (ref >90)
GLUCOSE: 100 mg/dL (ref 70–140)
Potassium: 4.2 mEq/L (ref 3.5–5.1)
Sodium: 143 mEq/L (ref 136–145)
Total Protein: 6.6 g/dL (ref 6.4–8.3)

## 2014-12-03 ENCOUNTER — Ambulatory Visit (HOSPITAL_BASED_OUTPATIENT_CLINIC_OR_DEPARTMENT_OTHER): Payer: Medicare Other | Admitting: Internal Medicine

## 2014-12-03 ENCOUNTER — Ambulatory Visit (HOSPITAL_BASED_OUTPATIENT_CLINIC_OR_DEPARTMENT_OTHER): Payer: Medicare Other

## 2014-12-03 ENCOUNTER — Encounter: Payer: Self-pay | Admitting: Internal Medicine

## 2014-12-03 ENCOUNTER — Telehealth: Payer: Self-pay | Admitting: Internal Medicine

## 2014-12-03 VITALS — BP 140/61 | HR 100 | Temp 98.2°F | Resp 17 | Ht 66.0 in | Wt 169.8 lb

## 2014-12-03 DIAGNOSIS — C3491 Malignant neoplasm of unspecified part of right bronchus or lung: Secondary | ICD-10-CM

## 2014-12-03 DIAGNOSIS — Z85118 Personal history of other malignant neoplasm of bronchus and lung: Secondary | ICD-10-CM

## 2014-12-03 NOTE — Progress Notes (Signed)
[facility] [phone]   [phone]  OFFICE PROGRESS NOTE  GREEN, Kathleen Spare, MD [address] [place] [address]  DIAGNOSIS: Stage IIB/IIIA non-small cell lung cancer consistent with adenocarcinoma diagnosed in December 2012.   PRIOR THERAPY:  1) Status post radiation therapy to the right upper lobe lung mass completed on 06/04/2011 under the care of Dr. Pablo Ledger.  2) Systemic chemotherapy with carboplatin for AUC of 5 and Alimta 500 mg/M2 giving every 3 weeks, status post 3 cycles, last dose was given on 07/21/2011.   CURRENT THERAPY: Observation.  INTERVAL HISTORY: [name] 69 y.o. female returns to the clinic today for routine four month follow up visit. The patient is feeling fine today with no specific complaints except for the baseline shortness of breath and persistent musculoskeletal pain on the right side of the chest.  She is currently on home oxygen 2 L/minute. She denied having any significant weight loss or night sweats. She has no nausea or vomiting, no fever or chills. She had repeat CT scan of the chest performed recently and she is here for evaluation and discussion of her scan results.  MEDICAL HISTORY: Past Medical History  Diagnosis Date  . 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 Restless legs syndrome (RLS)   . Chronic sinusitis   . Hypertension   . Hyperlipidemia   . Anemia, unspecified   . GERD (gastroesophageal reflux disease)   . Respiratory failure with hypoxia 01/02/2010  . Complication of anesthesia     slow to wake up  . Asthma   . Coronary artery disease     Dr. Burt Knack had stress test done  . Type II or unspecified type diabetes mellitus without mention of complication, not stated as uncontrolled   . Peripheral vascular disease   . PAD (peripheral artery disease)   . Heart murmur   . Dysrhythmia     irregular  . Anginal pain   . History of blood clots     "2 in my aorta"  . Hard of hearing,  right   . History of bronchitis   . Bronchial pneumonia     history  . COPD (chronic obstructive pulmonary disease)   . Emphysema   . Shortness of breath     "anytime really"  . Syncope and collapse   . Adenocarcinoma, lung     upper left lobe  . 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 Chemotherapy adverse reaction 09/15/11    "makes me light headed and pass out; this is 3rd blood transfusion since going thru it"  . History of blood transfusion   . Kidney infection     "related to chemo"  . Sinus headache     "all the time"  . Arthritis     "all over my body"  . Fibromyalgia   . Osteoporosis   . DDD (degenerative disc disease), lumbar   . Scoliosis   . Anxiety   . Mental disorder   . Reflux   . CHF (congestive heart failure)   . DVT (deep venous thrombosis)   . Hx of radiation therapy 04/28/11 -06/08/11    right upper lobe-lung  . Acute upper respiratory infections of unspecified site   . Pain in thoracic spine   . Asymptomatic varicose veins   . Functional urinary incontinence   . Diseases of lips   . Other abnormal blood chemistry   .  Effects of radiation, unspecified   . Anemia in neoplastic disease   . Muscle weakness (generalized)   . Dizziness and giddiness   . Spasm of muscle   . Unspecified vitamin D deficiency   . Hyperpotassemia   . Acute posthemorrhagic anemia   . Unspecified hearing loss   . Disorder of bone and cartilage, unspecified   . Screening for thyroid disorder   . Malignant neoplasm of bronchus and lung, unspecified site   . Hypopotassemia   . Atherosclerosis of native arteries of the extremities, unspecified   . Reflux esophagitis   . Unspecified constipation   . Type II or unspecified type diabetes mellitus with neurological manifestations, uncontrolled   . Unspecified hereditary and idiopathic peripheral neuropathy   . Urinary tract infection, site not specified   . Muscle weakness (generalized)   .  Acute sinusitis, unspecified   . Herpes zoster without mention of complication   . 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 Type II or unspecified type diabetes mellitus without mention of complication, not stated as uncontrolled   . Other and unspecified hyperlipidemia   . Anxiety state, unspecified   . Depressive disorder, not elsewhere classified   . Unspecified essential hypertension   . Atrial fibrillation   . Chronic airway obstruction, not elsewhere classified   . Osteoarthrosis, unspecified whether generalized or localized, unspecified site   . Lumbago   . Hip pain, chronic 09/17/2014    ALLERGIES:  is allergic to iohexol; other; anoro ellipta; codeine; and protonix.  MEDICATIONS:  Current Outpatient Prescriptions  Medication Sig Dispense Refill  . albuterol-ipratropium (COMBIVENT) 18-103 MCG/ACT inhaler every 4 (four) hours as needed. 2 puffs every for hours for lung cancer    . ALPRAZolam (XANAX) 1 MG tablet TAKE 1 TABLET BY MOUTH 3 TIMES A DAY AS NEEDED FOR ANXIETY 90 tablet 0  . AMBULATORY NON FORMULARY MEDICATION Lighter Weight Rollator with Padded Seat and Hand Brakes Dx: 496, 729.1, 722.52 1 each 0  . AMBULATORY NON FORMULARY MEDICATION Raised Commode Seat with Rail Dx: M51.36, M54.5 1 each 0  . amoxicillin-clavulanate (AUGMENTIN) 875-125 MG per tablet     . aspirin EC 325 MG tablet Take 325 mg by mouth daily.    . Calcium Citrate-Vitamin D (CITRACAL + D PO) Take 2 tablets by mouth daily.    . cetirizine-pseudoephedrine (ZYRTEC-D) 5-120 MG per tablet One twice daily to help allergy and congestion 60 tablet 5  . citalopram (CELEXA) 20 MG tablet TAKE 1 TABLET BY MOUTH EVERY DAY TO HELP MOOD 90 tablet 0  . COMBIVENT RESPIMAT 20-100 MCG/ACT AERS respimat     . diltiazem (CARDIZEM CD) 240 MG 24 hr capsule TAKE 1 CAPSULE BY MOUTH EVERY DAY FOR BLOOD PRESSURE 90 capsule 1  . Diphenhyd-Hydrocort-Nystatin (FIRST-DUKES MOUTHWASH) SUSP Use as directed 5 mLs in the mouth or throat  3 (three) times daily. 237 mL 0  . diphenhydrAMINE (BENADRYL) 50 MG tablet Take 1 tablet (50 mg total) by mouth once. 1 tablet 1  . FeFum-FePoly-FA-B Cmp-C-Biot (INTEGRA PLUS) CAPS Take 1 capsule by mouth daily.  3  . fluconazole (DIFLUCAN) 150 MG tablet Take 1 tablet (150 mg total) by mouth once. 1 tablet 0  . fluticasone (FLONASE) 50 MCG/ACT nasal spray INSTILL 2 SPRAYS INTO BOTH NOSTRILS  TWICE DAILY 16 g 2  . gemfibrozil (LOPID) 600 MG tablet TAKE 1 TABLET BY MOUTH ONCE DAILY 30 tablet 5  . glucose blood (FREESTYLE LITE) test strip Use to  test blood sugar three times daily. Dx: E11.9, E11.49 100 each 12  . glucose blood test strip Check blood sugar once daily  DX: 250.00    . HEMATINIC/FOLIC ACID 496-7 MG TABS TAKE 1 TABLET BY MOUTH DAILY 30 each 3  . HYDROcodone-acetaminophen (NORCO) 10-325 MG per tablet One every 6 hours if needed for pain 120 tablet 0  . Lancets (FREESTYLE) lancets Use to test blood sugar three times daily. Dx: E11.9, E11.49 100 each 12  . lidocaine (LIDODERM) 5 % Place 1 patch onto the skin daily as needed. For shoulder pain.Remove & Discard patch within 12 hours or as directed by MD. 30 patch 0  . loratadine (CLARITIN) 10 MG tablet TAKE 1 TABLET BY MOUTH EVERY DAY 30 tablet 5  . losartan (COZAAR) 50 MG tablet TAKE 1 TABLET BY MOUTH TWICE DAILY 60 tablet 4  . meloxicam (MOBIC) 15 MG tablet One daily to help arthritis 30 tablet 5  . metFORMIN (GLUCOPHAGE) 500 MG tablet TAKE 1 TABLET BY MOUTH 2 TIMES A DAY 60 tablet 5  . metoCLOPramide (REGLAN) 5 MG tablet TAKE 1 TABLET BY MOUTH 4 TIMES A DAY 120 tablet 1  . Multiple Vitamins-Minerals (CENTRUM SILVER PO) Take 1 tablet by mouth daily.    . NON FORMULARY Place 3 L into the nose daily. Oxygen    . nystatin (MYCOSTATIN) 100000 UNIT/ML suspension     . potassium chloride (K-DUR,KLOR-CON) 10 MEQ tablet TAKE 1 TABLET BY MOUTH EVERY DAY 30 tablet 5  . predniSONE (DELTASONE) 50 MG tablet Take one tablet 13 hours , one tablet  7  hours and one tablet 1 hour prior to CT scan 3 tablet 1  . ranitidine (ZANTAC) 300 MG tablet Take 1 tablet (300 mg total) by mouth at bedtime. 30 tablet 5  . traMADol (ULTRAM) 50 MG tablet TAKE 1 TABLET BY MOUTH THREE TIMES A DAY AS NEEDED FOR PAIN 90 tablet 0  . triamterene-hydrochlorothiazide (MAXZIDE-25) 37.5-25 MG per tablet TAKE 1 TABLET BY MOUTH EVERY DAY TO CONTROL BLOOD PRESSURE 90 tablet 0  . [DISCONTINUED] calcium carbonate 200 MG capsule Take 250 mg by mouth daily. Does not take everyday     No current facility-administered medications for this visit.   Facility-Administered Medications Ordered in Other Visits  Medication Dose Route Frequency Provider Last Rate Last Dose  . hyaluronate sodium (RADIAPLEXRX) gel   Topical Once Thea Silversmith, MD      . topical emolient (BIAFINE) emulsion   Topical PRN Thea Silversmith, MD        SURGICAL HISTORY:  Past Surgical History  Procedure Laterality Date  . Lung lobectomy  1986    left  . Septoplasty    . Total hip arthroplasty  01/2010    left  . Bronchoscopy  02/2011  . Dilation and curettage of uterus  1980's  . Tubal ligation  1980's  . Cardiac catheterization    . Fetal blood transfusion  09/16/11  . Joint replacement  04/13/10    Left Hip  . Cataract extraction  03/12/2013    Left Eye   . Eye surgery Left 2014    Cataract    REVIEW OF SYSTEMS:  A comprehensive review of systems was negative except for: Respiratory: positive for dyspnea on exertion and pleurisy/chest pain   PHYSICAL EXAMINATION: General appearance: alert, cooperative and no distress Head: Normocephalic, without obvious abnormality, atraumatic Neck: no adenopathy, no JVD, supple, symmetrical, trachea midline and thyroid not enlarged, symmetric, no tenderness/mass/nodules Lymph nodes:  Cervical, supraclavicular, and axillary nodes normal. Resp: clear to auscultation bilaterally Back: symmetric, no curvature. ROM normal. No CVA tenderness. Cardio: regular rate  and rhythm, S1, S2 normal, no murmur, click, rub or gallop GI: soft, non-tender; bowel sounds normal; no masses,  no organomegaly Extremities: extremities normal, atraumatic, no cyanosis or edema  ECOG PERFORMANCE STATUS: 1 - Symptomatic but completely ambulatory  Blood pressure 140/61, pulse 100, temperature 98.2 F (36.8 C), temperature source Oral, resp. rate 17, height 5' 6"  (1.676 m), weight 169 lb 12.8 oz (77.021 kg), SpO2 99 %.  LABORATORY DATA: Lab Results  Component Value Date   WBC 7.2 12/02/2014   HGB 11.1* 12/02/2014   HCT 33.2* 12/02/2014   MCV 90.2 12/02/2014   PLT 319 12/02/2014      Chemistry      Component Value Date/Time   NA 143 12/02/2014 1309   NA 142 03/12/2014 1737   NA 138 05/23/2012 1009   NA 142 10/25/2011 1200   K 4.2 12/02/2014 1309   K 3.9 03/12/2014 1737   K 4.6 10/25/2011 1200   CL 97 03/12/2014 1737   CL 99 07/21/2012 1125   CL 97* 10/25/2011 1200   CO2 26 12/02/2014 1309   CO2 27 03/12/2014 1737   CO2 24 10/25/2011 1200   BUN 15.9 12/02/2014 1309   BUN 15 03/12/2014 1737   BUN 22 05/23/2012 1009   BUN 15 10/25/2011 1200   CREATININE 1.1 12/02/2014 1309   CREATININE 1.10* 03/12/2014 1737   CREATININE 0.8 10/25/2011 1200      Component Value Date/Time   CALCIUM 10.1 12/02/2014 1309   CALCIUM 10.5* 03/12/2014 1737   CALCIUM 9.6 10/25/2011 1200   ALKPHOS 101 12/02/2014 1309   ALKPHOS 86 03/12/2014 1737   ALKPHOS 106* 10/25/2011 1200   AST 18 12/02/2014 1309   AST 24 03/12/2014 1737   AST 25 10/25/2011 1200   ALT 12 12/02/2014 1309   ALT 15 03/12/2014 1737   ALT 14 10/25/2011 1200   BILITOT 0.39 12/02/2014 1309   BILITOT 0.3 03/12/2014 1737   BILITOT 0.60 10/25/2011 1200       RADIOGRAPHIC STUDIES: Ct Chest Wo Contrast  12/02/2014   CLINICAL DATA:  Restaging lung cancer. History of right lung cancer initially diagnosed in 2012.  EXAM: CT CHEST WITHOUT CONTRAST  TECHNIQUE: Multidetector CT imaging of the chest was performed  following the standard protocol without IV contrast.  COMPARISON:  07/29/2014 and 01/25/2014  FINDINGS: Chest wall: No breast masses, supraclavicular or axillary lymphadenopathy. The thyroid gland appears normal. The bony thorax is intact. No destructive bone lesions or spinal canal compromise. Remote thoracic compression fractures and vertebral augmentation changes. Stable changes involving the right posterior ribs, possibly related to radiation change. There is a ununited fracture involving the right fifth posterior rib.  Mediastinum: The heart is normal in size. Small amount of pericardial fluid but no overt effusion. The aorta demonstrates stable atherosclerotic calcifications. No aneurysm. Stable coronary artery calcifications. No enlarged mediastinal or hilar lymph nodes. Stable soft tissue density surrounding the right mainstem bronchus likely radiation fibrosis. I do not see any findings to suggest recurrent tumor.  Lungs/ pleura: Stable emphysematous changes. Stable surgical changes from a left upper lobe lobectomy. There is a stable right upper lobe ground-glass density measuring 7.3 x 4.5 cm. It previously measured 6.7 x 3.8 cm. This is worrisome for a slow growing adenocarcinoma. There are small adjacent ground-glass nodules which have also enlarged. The nodule on image number  23 measures 8.5 mm. The nodule on image 27 measures 9.5 mm.  8 mm vague ground-glass nodule in the left upper lobe on image number 25 appears stable. No acute pulmonary findings. There is a small persistent right pleural effusion.  Upper abdomen: Stable gallstones and advanced atherosclerotic calcifications involving the aorta. There are stable bilateral adrenal gland adenomas.  IMPRESSION: 1. Continued enlargement of the ground-glass opacity in the right upper lobe with enlarging adjacent ground-glass nodules. This is most likely slow growing adenocarcinoma. 2. Stable surgical changes involving the left hemi thorax and stable  radiation changes involving the right hemi thorax. 3. No mediastinal or hilar mass or adenopathy. 4. Small right pleural effusion. 5. Stable upper right rib changes but no other findings for bony metastatic disease. 6. Stable bilateral adrenal gland adenomas.   Electronically Signed   By: Marijo Sanes M.D.   On: 12/02/2014 14:02   ASSESSMENT AND PLAN: this is a very pleasant 69 years old white female with history of stage IIB/IIIA non-small cell lung cancer status post concurrent chemoradiation followed by consolidation chemotherapy with carboplatin and Alimta and has been observation since April 2013. She has no evidence for disease progression on his recent scan except for a continuous enlargement of the right upper lobe opacity. I discussed the scan results with the patient today. I will order blood test for evaluation of EGFR mutation status. I discussed with the patient her treatment options including treatment with targeted therapy if she has positive EGFR mutation versus consideration of treatment with immunotherapy versus close monitoring and observation. The patient is interested in proceeding with some form of treatment. I will see her back for follow-up visit in 3 weeks for reevaluation and discussion of her treatment options based on the EGFR mutational status. She was advised to call immediately if she has any concerning symptoms in the interval. The patient voices understanding of current disease status and treatment options and is in agreement with the current care plan.  All questions were answered. The patient knows to call the clinic with any problems, questions or concerns. We can certainly see the patient much sooner if necessary.  Disclaimer: This note was dictated with voice recognition software. Similar sounding words can inadvertently be transcribed and may not be corrected upon review.

## 2014-12-03 NOTE — Telephone Encounter (Signed)
Pt confirmed labs/ov per 08/23 POF, gave pt avs and calendar... KJ

## 2014-12-04 ENCOUNTER — Ambulatory Visit: Payer: Medicare Other | Admitting: Internal Medicine

## 2014-12-05 ENCOUNTER — Telehealth: Payer: Self-pay

## 2014-12-05 NOTE — Telephone Encounter (Signed)
Patient states we should have paperwork from [facility], this paperwork is to give approval for patient to have a dog. Patient states Dr.Green originally denied this paperwork, then she came into the office and thoroughly discussed it with him and he changed his mind and said he would approve. Paperwork was resubmitted to [place] for Dr.Green to complete about a week ago. Patient would like status of this paperwork   Dr.Green please advise if you seen the second request

## 2014-12-05 NOTE — Telephone Encounter (Signed)
I have not seen the second request. I did tell her I would fill out the papers again.

## 2014-12-06 NOTE — Telephone Encounter (Signed)
Discussed with patient, patient verbalized understanding. Patient will get form sent again

## 2014-12-08 NOTE — Progress Notes (Signed)
Cardiology Office Note Date:  12/09/2014   ID:  Kathleen, Fry [birth year], MRN [id number]  PCP:  Kathleen Dooms, MD  Cardiologist:  Kathleen Mocha, MD    Chief Complaint  Patient presents with  . Shortness of Breath   History of Present Illness: [name] is a 69 y.o. female who presents for follow-up of nonobstructive CAD. Other medical problems include lung CA s/p chemotherapy and radiation followed by Dr [name] and PAD followed by Dr [name]. She was last seen in our office by [name] one year ago. Last cath was in 2006 and demonstrated mild nonobstructive CAD, primarily affecting the LAD.   She notes there has been some increase in right lung CA (ground glass nodules) felt to be slowly growing AdenoCA. She is considering treatment options but has not tolerated chemotherapy well in the past.   She has occasional chest pain on the left side. Sx's are unpredictable and not related to physical exertion. She is markedly limited with shortness of breath and is on home O2 24/7. Complains of mild ankle swelling. Denies orthopnea or PND.    Past Medical History  Diagnosis Date  . 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 Restless legs syndrome (RLS)   . Chronic sinusitis   . Hypertension   . Hyperlipidemia   . Anemia, unspecified   . GERD (gastroesophageal reflux disease)   . Respiratory failure with hypoxia 01/02/2010  . Complication of anesthesia     slow to wake up  . Asthma   . Coronary artery disease     Dr. Burt Fry had stress test done  . Type II or unspecified type diabetes mellitus without mention of complication, not stated as uncontrolled   . Peripheral vascular disease   . PAD (peripheral artery disease)   . Heart murmur   . Dysrhythmia     irregular  . Anginal pain   . History of blood clots     "2 in my aorta"  . Hard of hearing, right   . History of bronchitis   . Bronchial pneumonia     history  . COPD (chronic obstructive pulmonary disease)    . Emphysema   . Shortness of breath     "anytime really"  . Syncope and collapse   . Adenocarcinoma, lung     upper left lobe  . 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 Chemotherapy adverse reaction 09/15/11    "makes me light headed and pass out; this is 3rd blood transfusion since going thru it"  . History of blood transfusion   . Kidney infection     "related to chemo"  . Sinus headache     "all the time"  . Arthritis     "all over my body"  . Fibromyalgia   . Osteoporosis   . DDD (degenerative disc disease), lumbar   . Scoliosis   . Anxiety   . Mental disorder   . Reflux   . CHF (congestive heart failure)   . DVT (deep venous thrombosis)   . Hx of radiation therapy 04/28/11 -06/08/11    right upper lobe-lung  . Acute upper respiratory infections of unspecified site   . Pain in thoracic spine   . Asymptomatic varicose veins   . Functional urinary incontinence   . Diseases of lips   . Other abnormal blood chemistry   . Effects of radiation, unspecified   . Anemia in neoplastic disease   .  Muscle weakness (generalized)   . Dizziness and giddiness   . Spasm of muscle   . Unspecified vitamin D deficiency   . Hyperpotassemia   . Acute posthemorrhagic anemia   . Unspecified hearing loss   . Disorder of bone and cartilage, unspecified   . Screening for thyroid disorder   . Malignant neoplasm of bronchus and lung, unspecified site   . Hypopotassemia   . Atherosclerosis of native arteries of the extremities, unspecified   . Reflux esophagitis   . Unspecified constipation   . Type II or unspecified type diabetes mellitus with neurological manifestations, uncontrolled   . Unspecified hereditary and idiopathic peripheral neuropathy   . Urinary tract infection, site not specified   . Muscle weakness (generalized)   . Acute sinusitis, unspecified   . Herpes zoster without mention of complication   . Carcinoma in situ of bronchus and  lung   . Type II or unspecified type diabetes mellitus without mention of complication, not stated as uncontrolled   . Other and unspecified hyperlipidemia   . Anxiety state, unspecified   . Depressive disorder, not elsewhere classified   . Unspecified essential hypertension   . Atrial fibrillation   . Chronic airway obstruction, not elsewhere classified   . Osteoarthrosis, unspecified whether generalized or localized, unspecified site   . Lumbago   . Hip pain, chronic 09/17/2014    Past Surgical History  Procedure Laterality Date  . Lung lobectomy  1986    left  . Septoplasty    . Total hip arthroplasty  01/2010    left  . Bronchoscopy  02/2011  . Dilation and curettage of uterus  1980's  . Tubal ligation  1980's  . Cardiac catheterization    . Fetal blood transfusion  09/16/11  . Joint replacement  04/13/10    Left Hip  . Cataract extraction  03/12/2013    Left Eye   . Eye surgery Left 2014    Cataract    Current Outpatient Prescriptions  Medication Sig Dispense Refill  . albuterol-ipratropium (COMBIVENT) 18-103 MCG/ACT inhaler every 4 (four) hours as needed. 2 puffs every for hours for lung cancer    . ALPRAZolam (XANAX) 1 MG tablet TAKE 1 TABLET BY MOUTH 3 TIMES A DAY AS NEEDED FOR ANXIETY 90 tablet 0  . AMBULATORY NON FORMULARY MEDICATION Lighter Weight Rollator with Padded Seat and Hand Brakes Dx: 496, 729.1, 722.52 1 each 0  . AMBULATORY NON FORMULARY MEDICATION Raised Commode Seat with Rail Dx: M51.36, M54.5 1 each 0  . aspirin 81 MG tablet Take 81 mg by mouth daily.    . Calcium Citrate-Vitamin D (CITRACAL + D PO) Take 2 tablets by mouth daily.    . cetirizine-pseudoephedrine (ZYRTEC-D) 5-120 MG per tablet One twice daily to help allergy and congestion (Patient taking differently: Take 1 tablet by mouth 2 (two) times daily. One twice daily to help allergy and congestion) 60 tablet 5  . citalopram (CELEXA) 20 MG tablet TAKE 1 TABLET BY MOUTH EVERY DAY TO HELP MOOD 90  tablet 0  . COMBIVENT RESPIMAT 20-100 MCG/ACT AERS respimat Inhale 1 puff into the lungs every 6 (six) hours as needed for wheezing.     . diltiazem (CARDIZEM CD) 240 MG 24 hr capsule TAKE 1 CAPSULE BY MOUTH EVERY DAY FOR BLOOD PRESSURE 90 capsule 1  . fluconazole (DIFLUCAN) 150 MG tablet Take 1 tablet (150 mg total) by mouth once. 1 tablet 0  . fluticasone (FLONASE) 50 MCG/ACT nasal spray INSTILL  2 SPRAYS INTO BOTH NOSTRILS  TWICE DAILY 16 g 2  . gemfibrozil (LOPID) 600 MG tablet TAKE 1 TABLET BY MOUTH ONCE DAILY 30 tablet 5  . glucose blood (FREESTYLE LITE) test strip Use to test blood sugar three times daily. Dx: E11.9, E11.49 100 each 12  . glucose blood test strip Check blood sugar once daily  DX: 250.00    . HEMATINIC/FOLIC ACID 841-3 MG TABS TAKE 1 TABLET BY MOUTH DAILY 30 each 3  . HYDROcodone-acetaminophen (NORCO) 10-325 MG per tablet One every 6 hours if needed for pain 120 tablet 0  . Lancets (FREESTYLE) lancets Use to test blood sugar three times daily. Dx: E11.9, E11.49 100 each 12  . loratadine (CLARITIN) 10 MG tablet TAKE 1 TABLET BY MOUTH EVERY DAY 30 tablet 5  . losartan (COZAAR) 50 MG tablet TAKE 1 TABLET BY MOUTH TWICE DAILY 60 tablet 4  . meloxicam (MOBIC) 15 MG tablet One daily to help arthritis (Patient taking differently: Take 15 mg by mouth daily. One daily to help arthritis) 30 tablet 5  . metFORMIN (GLUCOPHAGE) 500 MG tablet TAKE 1 TABLET BY MOUTH 2 TIMES A DAY 60 tablet 5  . metoCLOPramide (REGLAN) 5 MG tablet TAKE 1 TABLET BY MOUTH 4 TIMES A DAY 120 tablet 1  . Multiple Vitamins-Minerals (CENTRUM SILVER PO) Take 1 tablet by mouth daily. Patient unsure of dose    . NON FORMULARY Place 3 L into the nose daily. Oxygen    . nystatin (MYCOSTATIN) 100000 UNIT/ML suspension     . potassium chloride (K-DUR,KLOR-CON) 10 MEQ tablet TAKE 1 TABLET BY MOUTH EVERY DAY 30 tablet 5  . predniSONE (DELTASONE) 50 MG tablet Take one tablet 13 hours , one tablet  7 hours and one tablet 1  hour prior to CT scan 3 tablet 1  . ranitidine (ZANTAC) 300 MG tablet Take 1 tablet (300 mg total) by mouth at bedtime. 30 tablet 5  . traMADol (ULTRAM) 50 MG tablet TAKE 1 TABLET BY MOUTH THREE TIMES A DAY AS NEEDED FOR PAIN 90 tablet 0  . triamterene-hydrochlorothiazide (MAXZIDE-25) 37.5-25 MG per tablet TAKE 1 TABLET BY MOUTH EVERY DAY TO CONTROL BLOOD PRESSURE 90 tablet 0  . [DISCONTINUED] calcium carbonate 200 MG capsule Take 250 mg by mouth daily. Does not take everyday     No current facility-administered medications for this visit.   Facility-Administered Medications Ordered in Other Visits  Medication Dose Route Frequency Provider Last Rate Last Dose  . hyaluronate sodium (RADIAPLEXRX) gel   Topical Once Thea Silversmith, MD      . topical emolient (BIAFINE) emulsion   Topical PRN Thea Silversmith, MD        Allergies:   Iohexol; Other; Anoro ellipta; Codeine; and Protonix   Social History:  The patient  reports that she quit smoking about 17 years ago. Her smoking use included Cigarettes. She has a 60 pack-year smoking history. She has never used smokeless tobacco. She reports that she does not drink alcohol or use illicit drugs.   Family History:  The patient's  family history includes Arthritis in her daughter; Bone cancer in her father; Cancer in her father; Deep vein thrombosis in her mother; Diabetes in her mother; Heart disease in her mother; Hyperlipidemia in her mother; Hypertension in her mother; Varicose Veins in her mother.    ROS:  Please see the history of present illness.  Otherwise, review of systems is positive for chest pain, shortness of breath, hearing loss, visual disturbance,  cough, abdominal pain, depression, back pain, muscle pain, dizziness, easy bruising, recent fever, leg pain, snoring, wheezing, constipation, anxiety, and wants problems, headaches.  All other systems are reviewed and negative.    PHYSICAL EXAM: VS:  BP 126/68 mmHg  Pulse 82  Ht '5\' 6"'$   (1.676 m)  Wt 175 lb (79.379 kg)  BMI 28.26 kg/m2 , BMI Body mass index is 28.26 kg/(m^2). GEN: Well nourished, well developed, in no acute distress HEENT: normal Neck: no JVD, no masses. No carotid bruits Cardiac: RRR without murmur or gallop                Respiratory:  clear to auscultation bilaterally, normal work of breathing GI: soft, nontender, nondistended, + BS MS: no deformity or atrophy Ext: trace pretibial and ankle edema Skin: warm and dry, no rash Neuro:  Strength and sensation are intact Psych: euthymic mood, full affect  EKG:  EKG is ordered today. The ekg ordered today shows normal sinus rhythm with RV conduction delay, heart rate 82 bpm, nonspecific ST abnormality  Recent Labs: 12/02/2014: ALT 12; BUN 15.9; Creatinine 1.1; HGB 11.1*; Platelets 319; Potassium 4.2; Sodium 143   Lipid Panel     Component Value Date/Time   CHOL 207* 12/18/2013 1122   CHOL 176 09/16/2011 0931   TRIG 168* 12/18/2013 1122   HDL 63 12/18/2013 1122   HDL 68 09/16/2011 0931   CHOLHDL 3.3 12/18/2013 1122   CHOLHDL 2.6 09/16/2011 0931   VLDL 18 09/16/2011 0931   LDLCALC 110* 12/18/2013 1122   LDLCALC 90 09/16/2011 0931      Wt Readings from Last 3 Encounters:  12/09/14 175 lb (79.379 kg)  12/03/14 169 lb 12.8 oz (77.021 kg)  11/12/14 170 lb (77.111 kg)     Cardiac Studies Reviewed: 2D Echo 7.23.2015: Study Conclusions  - Left ventricle: The cavity size was normal. Systolic function was normal. The estimated ejection fraction was in the range of 60% to 65%. Wall motion was normal; there were no regional wall motion abnormalities. There was an increased relative contribution of atrial contraction to ventricular filling. Doppler parameters are consistent with abnormal left ventricular relaxation (grade 1 diastolic dysfunction). - Aortic valve: There was mild regurgitation. - Left atrium: The atrium was mildly dilated.  ASSESSMENT AND PLAN: 1.  CAD, native  vessel: The patient does have episodic chest pain. Her pains are somewhat atypical. Fortunately her recent CT scan did not show any evidence of pericardial effusion related to her lung malignancy. I don't think ischemic evaluation is indicated and I will plan on seeing her back in one year for follow-up evaluation.  2. Essential HTN: Blood pressure well controlled  3. Shortness of breath: Continue home O2 and supportive care.  Current medicines are reviewed with the patient today.  The patient does not have concerns regarding medicines.  Labs/ tests ordered today include:   Orders Placed This Encounter  Procedures  . EKG 12-Lead    Disposition:   FU one year  Signed, Kathleen Mocha, MD  12/09/2014 9:35 AM    [facility] [address], [place], [place]  [address] Phone: [phone]; Fax: [phone]

## 2014-12-09 ENCOUNTER — Encounter: Payer: Self-pay | Admitting: Cardiovascular Disease

## 2014-12-09 ENCOUNTER — Ambulatory Visit (INDEPENDENT_AMBULATORY_CARE_PROVIDER_SITE_OTHER): Payer: Medicare Other | Admitting: Cardiovascular Disease

## 2014-12-09 VITALS — BP 126/68 | HR 82 | Ht 66.0 in | Wt 175.0 lb

## 2014-12-09 DIAGNOSIS — I25119 Atherosclerotic heart disease of native coronary artery with unspecified angina pectoris: Secondary | ICD-10-CM | POA: Diagnosis not present

## 2014-12-09 DIAGNOSIS — I779 Disorder of arteries and arterioles, unspecified: Secondary | ICD-10-CM | POA: Diagnosis not present

## 2014-12-09 NOTE — Patient Instructions (Signed)

## 2014-12-17 ENCOUNTER — Other Ambulatory Visit: Payer: Self-pay | Admitting: *Deleted

## 2014-12-17 DIAGNOSIS — M25552 Pain in left hip: Secondary | ICD-10-CM

## 2014-12-17 DIAGNOSIS — G8929 Other chronic pain: Secondary | ICD-10-CM

## 2014-12-17 DIAGNOSIS — M5136 Other intervertebral disc degeneration, lumbar region: Secondary | ICD-10-CM

## 2014-12-17 DIAGNOSIS — M51369 Other intervertebral disc degeneration, lumbar region without mention of lumbar back pain or lower extremity pain: Secondary | ICD-10-CM

## 2014-12-17 DIAGNOSIS — G44229 Chronic tension-type headache, not intractable: Secondary | ICD-10-CM

## 2014-12-17 MED ORDER — HYDROCODONE-ACETAMINOPHEN 10-325 MG PO TABS: ORAL_TABLET | ORAL | Status: DC

## 2014-12-17 NOTE — Telephone Encounter (Signed)
Patient requested and will pick up 

## 2014-12-18 ENCOUNTER — Telehealth: Payer: Self-pay

## 2014-12-18 NOTE — Telephone Encounter (Signed)
Incoming fax received from [employer] [facility] requesting change of status paperwork to be completed due to change in physical condition. Paperwork was placed on Dr.Green's ledge for completion

## 2014-12-19 ENCOUNTER — Other Ambulatory Visit: Payer: Self-pay | Admitting: Internal Medicine

## 2014-12-23 ENCOUNTER — Telehealth: Payer: Self-pay | Admitting: Internal Medicine

## 2014-12-23 ENCOUNTER — Ambulatory Visit (HOSPITAL_BASED_OUTPATIENT_CLINIC_OR_DEPARTMENT_OTHER): Payer: Medicare Other | Admitting: Internal Medicine

## 2014-12-23 ENCOUNTER — Other Ambulatory Visit (HOSPITAL_BASED_OUTPATIENT_CLINIC_OR_DEPARTMENT_OTHER): Payer: Medicare Other

## 2014-12-23 ENCOUNTER — Encounter: Payer: Self-pay | Admitting: Internal Medicine

## 2014-12-23 VITALS — BP 164/64 | HR 104 | Temp 99.0°F | Resp 19 | Ht 66.0 in | Wt 169.9 lb

## 2014-12-23 DIAGNOSIS — C3491 Malignant neoplasm of unspecified part of right bronchus or lung: Secondary | ICD-10-CM

## 2014-12-23 DIAGNOSIS — C3411 Malignant neoplasm of upper lobe, right bronchus or lung: Secondary | ICD-10-CM

## 2014-12-23 LAB — COMPREHENSIVE METABOLIC PANEL (CC13)
ALT: 14 U/L (ref 0–55)
AST: 19 U/L (ref 5–34)
Albumin: 4 g/dL (ref 3.5–5.0)
Alkaline Phosphatase: 108 U/L (ref 40–150)
Anion Gap: 11 meq/L (ref 3–11)
BUN: 18.7 mg/dL (ref 7.0–26.0)
CO2: 28 meq/L (ref 22–29)
Calcium: 10.3 mg/dL (ref 8.4–10.4)
Chloride: 102 meq/L (ref 98–109)
Creatinine: 1.1 mg/dL (ref 0.6–1.1)
EGFR: 49 ml/min/1.73 m2 — ABNORMAL LOW
Glucose: 106 mg/dL (ref 70–140)
Potassium: 4 meq/L (ref 3.5–5.1)
Sodium: 141 meq/L (ref 136–145)
Total Bilirubin: 0.42 mg/dL (ref 0.20–1.20)
Total Protein: 6.9 g/dL (ref 6.4–8.3)

## 2014-12-23 LAB — CBC WITH DIFFERENTIAL/PLATELET
BASO%: 0.5 % (ref 0.0–2.0)
BASOS ABS: 0 10*3/uL (ref 0.0–0.1)
EOS%: 2.3 % (ref 0.0–7.0)
Eosinophils Absolute: 0.2 10*3/uL (ref 0.0–0.5)
HCT: 35.7 % (ref 34.8–46.6)
HEMOGLOBIN: 12 g/dL (ref 11.6–15.9)
LYMPH#: 1.7 10*3/uL (ref 0.9–3.3)
LYMPH%: 19.7 % (ref 14.0–49.7)
MCH: 30.5 pg (ref 25.1–34.0)
MCHC: 33.8 g/dL (ref 31.5–36.0)
MCV: 90.4 fL (ref 79.5–101.0)
MONO#: 0.7 10*3/uL (ref 0.1–0.9)
MONO%: 7.9 % (ref 0.0–14.0)
NEUT#: 6.1 10*3/uL (ref 1.5–6.5)
NEUT%: 69.6 % (ref 38.4–76.8)
Platelets: 343 10*3/uL (ref 145–400)
RBC: 3.94 10*6/uL (ref 3.70–5.45)
RDW: 12.6 % (ref 11.2–14.5)
WBC: 8.8 10*3/uL (ref 3.9–10.3)

## 2014-12-23 MED ORDER — CYANOCOBALAMIN 1000 MCG/ML IJ SOLN
1000.0000 ug | Freq: Once | INTRAMUSCULAR | Status: AC
  Administered 2014-12-23: 1000 ug via INTRAMUSCULAR

## 2014-12-23 MED ORDER — FOLIC ACID 1 MG PO TABS: 1.0000 mg | ORAL_TABLET | Freq: Every day | ORAL | Status: DC

## 2014-12-23 MED ORDER — PROCHLORPERAZINE MALEATE 10 MG PO TABS: 10.0000 mg | ORAL_TABLET | Freq: Four times a day (QID) | ORAL | Status: DC | PRN

## 2014-12-23 MED ORDER — DEXAMETHASONE 4 MG PO TABS: ORAL_TABLET | ORAL | Status: DC

## 2014-12-23 NOTE — Telephone Encounter (Signed)
lvm for pt regarding to sept appt... °

## 2014-12-23 NOTE — Progress Notes (Signed)
[place] Telephone:([phone]) [phone]   Fax:([phone]) [phone]  OFFICE PROGRESS NOTE  GREEN, [name], MD [address] [place] [address]  DIAGNOSIS: Progressive non-small cell lung cancer initially diagnosed as Stage IIB/IIIA non-small cell lung cancer consistent with adenocarcinoma with negative EGFR mutation and negative ALK gene translocation diagnosed in December 2012.   PRIOR THERAPY:  1) Status post radiation therapy to the right upper lobe lung mass completed on 06/04/2011 under the care of Dr. Pablo Ledger.  2) Systemic chemotherapy with carboplatin for AUC of 5 and Alimta 500 mg/M2 giving every 3 weeks, status post 3 cycles, last dose was given on 07/21/2011.   CURRENT THERAPY: Systemic chemotherapy with carboplatin for AUC of 5 and Alimta 500 MG/M2 every 3 weeks. First dose expected on 01/02/2015.  INTERVAL HISTORY: [name] 69 y.o. female returns to the clinic today for routine four month follow up visit accompanied by her daughter. The patient is feeling fine today with no specific complaints except for the baseline shortness of breath and persistent mild pain on the right side of the chest. She is currently on home oxygen 2 L/minute. She felt much better after starting oral iron tablets. She denied having any significant weight loss or night sweats. She has no nausea or vomiting, no fever or chills. She had a recent blood test for EGFR mutation that was reported to be negative. The patient is here today for evaluation and discussion of her treatment options.  MEDICAL HISTORY: Past Medical History  Diagnosis Date  . 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 Restless legs syndrome (RLS)   . Chronic sinusitis   . Hypertension   . Hyperlipidemia   . Anemia, unspecified   . GERD (gastroesophageal reflux disease)   . Respiratory failure with hypoxia 01/02/2010  . Complication of anesthesia     slow to wake up  . Asthma   . Coronary artery disease      Dr. Burt Knack had stress test done  . Type II or unspecified type diabetes mellitus without mention of complication, not stated as uncontrolled   . Peripheral vascular disease   . PAD (peripheral artery disease)   . Heart murmur   . Dysrhythmia     irregular  . Anginal pain   . History of blood clots     "2 in my aorta"  . Hard of hearing, right   . History of bronchitis   . Bronchial pneumonia     history  . COPD (chronic obstructive pulmonary disease)   . Emphysema   . Shortness of breath     "anytime really"  . Syncope and collapse   . Adenocarcinoma, lung     upper left lobe  . 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 Chemotherapy adverse reaction 09/15/11    "makes me light headed and pass out; this is 3rd blood transfusion since going thru it"  . History of blood transfusion   . Kidney infection     "related to chemo"  . Sinus headache     "all the time"  . Arthritis     "all over my body"  . Fibromyalgia   . Osteoporosis   . DDD (degenerative disc disease), lumbar   . Scoliosis   . Anxiety   . Mental disorder   . Reflux   . CHF (congestive heart failure)   . DVT (deep venous thrombosis)   . Hx  of radiation therapy 04/28/11 -06/08/11    right upper lobe-lung  . Acute upper respiratory infections of unspecified site   . Pain in thoracic spine   . Asymptomatic varicose veins   . Functional urinary incontinence   . Diseases of lips   . Other abnormal blood chemistry   . Effects of radiation, unspecified   . Anemia in neoplastic disease   . Muscle weakness (generalized)   . Dizziness and giddiness   . Spasm of muscle   . Unspecified vitamin D deficiency   . Hyperpotassemia   . Acute posthemorrhagic anemia   . Unspecified hearing loss   . Disorder of bone and cartilage, unspecified   . Screening for thyroid disorder   . Malignant neoplasm of bronchus and lung, unspecified site   . Hypopotassemia   . Atherosclerosis of native  arteries of the extremities, unspecified   . Reflux esophagitis   . Unspecified constipation   . Type II or unspecified type diabetes mellitus with neurological manifestations, uncontrolled   . Unspecified hereditary and idiopathic peripheral neuropathy   . Urinary tract infection, site not specified   . Muscle weakness (generalized)   . Acute sinusitis, unspecified   . Herpes zoster without mention of complication   . 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 Type II or unspecified type diabetes mellitus without mention of complication, not stated as uncontrolled   . Other and unspecified hyperlipidemia   . Anxiety state, unspecified   . Depressive disorder, not elsewhere classified   . Unspecified essential hypertension   . Atrial fibrillation   . Chronic airway obstruction, not elsewhere classified   . Osteoarthrosis, unspecified whether generalized or localized, unspecified site   . Lumbago   . Hip pain, chronic 09/17/2014    ALLERGIES:  is allergic to iohexol; other; anoro ellipta; codeine; and protonix.  MEDICATIONS:  Current Outpatient Prescriptions  Medication Sig Dispense Refill  . albuterol-ipratropium (COMBIVENT) 18-103 MCG/ACT inhaler Inhale 2 puffs into the lungs every 4 (four) hours as needed (lung cancer).     . ALPRAZolam (XANAX) 1 MG tablet TAKE 1 TABLET BY MOUTH THREE TIMES A DAY AS NEEDED FOR ANXIETY 90 tablet 0  . AMBULATORY NON FORMULARY MEDICATION Lighter Weight Rollator with Padded Seat and Hand Brakes Dx: 496, 729.1, 722.52 1 each 0  . AMBULATORY NON FORMULARY MEDICATION Raised Commode Seat with Rail Dx: M51.36, M54.5 1 each 0  . aspirin 81 MG tablet Take 81 mg by mouth daily.    . Calcium Citrate-Vitamin D (CITRACAL + D PO) Take 2 tablets by mouth daily.    . cetirizine-pseudoephedrine (ZYRTEC-D) 5-120 MG per tablet Take 1 tablet by mouth 2 (two) times daily. To help allergy and congestion    . citalopram (CELEXA) 20 MG tablet TAKE 1 TABLET BY MOUTH EVERY  DAY TO HELP MOOD 90 tablet 0  . COMBIVENT RESPIMAT 20-100 MCG/ACT AERS respimat Inhale 1 puff into the lungs every 6 (six) hours as needed for wheezing.     . diltiazem (CARDIZEM CD) 240 MG 24 hr capsule TAKE 1 CAPSULE BY MOUTH EVERY DAY FOR BLOOD PRESSURE 90 capsule 1  . fluticasone (FLONASE) 50 MCG/ACT nasal spray INSTILL 2 SPRAYS INTO BOTH NOSTRILS  TWICE DAILY 16 g 2  . gemfibrozil (LOPID) 600 MG tablet TAKE 1 TABLET BY MOUTH ONCE DAILY 30 tablet 5  . glucose blood (FREESTYLE LITE) test strip Use to test blood sugar three times daily. Dx: E11.9, E11.49 100 each 12  .  glucose blood test strip Check blood sugar once daily  DX: 250.00    . HEMATINIC/FOLIC ACID 388-8 MG TABS TAKE 1 TABLET BY MOUTH DAILY 30 each 3  . HYDROcodone-acetaminophen (NORCO) 10-325 MG per tablet Take One tablet by mouth every 6 hours if needed for pain 120 tablet 0  . Lancets (FREESTYLE) lancets Use to test blood sugar three times daily. Dx: E11.9, E11.49 100 each 12  . loratadine (CLARITIN) 10 MG tablet TAKE 1 TABLET BY MOUTH EVERY DAY 30 tablet 5  . losartan (COZAAR) 50 MG tablet TAKE 1 TABLET BY MOUTH TWICE DAILY 60 tablet 4  . meloxicam (MOBIC) 15 MG tablet One daily to help arthritis (Patient taking differently: Take 15 mg by mouth daily. One daily to help arthritis) 30 tablet 5  . metFORMIN (GLUCOPHAGE) 500 MG tablet TAKE 1 TABLET BY MOUTH 2 TIMES A DAY 60 tablet 5  . metoCLOPramide (REGLAN) 5 MG tablet TAKE 1 TABLET BY MOUTH 4 TIMES A DAY 120 tablet 1  . Multiple Vitamins-Minerals (CENTRUM SILVER PO) Take 1 tablet by mouth daily. Patient unsure of dose    . NON FORMULARY Place 3 L into the nose daily. Oxygen    . nystatin (MYCOSTATIN) 100000 UNIT/ML suspension     . potassium chloride (K-DUR,KLOR-CON) 10 MEQ tablet TAKE 1 TABLET BY MOUTH EVERY DAY 30 tablet 5  . predniSONE (DELTASONE) 50 MG tablet Take one tablet 13 hours , one tablet  7 hours and one tablet 1 hour prior to CT scan 3 tablet 1  . ranitidine (ZANTAC)  300 MG tablet Take 1 tablet (300 mg total) by mouth at bedtime. 30 tablet 5  . traMADol (ULTRAM) 50 MG tablet TAKE 1 TABLET BY MOUTH THREE TIMES A DAY AS NEEDED FOR PAIN 90 tablet 0  . triamterene-hydrochlorothiazide (MAXZIDE-25) 37.5-25 MG per tablet TAKE 1 TABLET BY MOUTH EVERY DAY TO CONTROL BLOOD PRESSURE 90 tablet 0  . [DISCONTINUED] calcium carbonate 200 MG capsule Take 250 mg by mouth daily. Does not take everyday     No current facility-administered medications for this visit.   Facility-Administered Medications Ordered in Other Visits  Medication Dose Route Frequency Provider Last Rate Last Dose  . hyaluronate sodium (RADIAPLEXRX) gel   Topical Once Thea Silversmith, MD      . topical emolient (BIAFINE) emulsion   Topical PRN Thea Silversmith, MD        SURGICAL HISTORY:  Past Surgical History  Procedure Laterality Date  . Lung lobectomy  1986    left  . Septoplasty    . Total hip arthroplasty  01/2010    left  . Bronchoscopy  02/2011  . Dilation and curettage of uterus  1980's  . Tubal ligation  1980's  . Cardiac catheterization    . Fetal blood transfusion  09/16/11  . Joint replacement  04/13/10    Left Hip  . Cataract extraction  03/12/2013    Left Eye   . Eye surgery Left 2014    Cataract    REVIEW OF SYSTEMS:  Constitutional: positive for fatigue Eyes: negative Ears, nose, mouth, throat, and face: negative Respiratory: positive for dyspnea on exertion Cardiovascular: negative Gastrointestinal: negative Genitourinary:negative Integument/breast: negative Hematologic/lymphatic: negative Musculoskeletal:negative Neurological: negative Behavioral/Psych: negative Endocrine: negative Allergic/Immunologic: negative   PHYSICAL EXAMINATION: General appearance: alert, cooperative, fatigued and no distress Head: Normocephalic, without obvious abnormality, atraumatic Neck: no adenopathy, no JVD, supple, symmetrical, trachea midline and thyroid not enlarged, symmetric, no  tenderness/mass/nodules Lymph nodes: Cervical, supraclavicular, and  axillary nodes normal. Resp: clear to auscultation bilaterally Back: symmetric, no curvature. ROM normal. No CVA tenderness. Cardio: regular rate and rhythm, S1, S2 normal, no murmur, click, rub or gallop GI: soft, non-tender; bowel sounds normal; no masses,  no organomegaly Extremities: extremities normal, atraumatic, no cyanosis or edema Neurologic: Alert and oriented X 3, normal strength and tone. Normal symmetric reflexes. Normal coordination and gait  ECOG PERFORMANCE STATUS: 1 - Symptomatic but completely ambulatory  Blood pressure 164/64, pulse 104, temperature 99 F (37.2 C), temperature source Oral, resp. rate 19, height 5' 6"  (1.676 m), weight 169 lb 14.4 oz (77.066 kg), SpO2 99 %.  LABORATORY DATA: Lab Results  Component Value Date   WBC 8.8 12/23/2014   HGB 12.0 12/23/2014   HCT 35.7 12/23/2014   MCV 90.4 12/23/2014   PLT 343 12/23/2014      Chemistry      Component Value Date/Time   NA 143 12/02/2014 1309   NA 142 03/12/2014 1737   NA 138 05/23/2012 1009   NA 142 10/25/2011 1200   K 4.2 12/02/2014 1309   K 3.9 03/12/2014 1737   K 4.6 10/25/2011 1200   CL 97 03/12/2014 1737   CL 99 07/21/2012 1125   CL 97* 10/25/2011 1200   CO2 26 12/02/2014 1309   CO2 27 03/12/2014 1737   CO2 24 10/25/2011 1200   BUN 15.9 12/02/2014 1309   BUN 15 03/12/2014 1737   BUN 22 05/23/2012 1009   BUN 15 10/25/2011 1200   CREATININE 1.1 12/02/2014 1309   CREATININE 1.10* 03/12/2014 1737   CREATININE 0.8 10/25/2011 1200      Component Value Date/Time   CALCIUM 10.1 12/02/2014 1309   CALCIUM 10.5* 03/12/2014 1737   CALCIUM 9.6 10/25/2011 1200   ALKPHOS 101 12/02/2014 1309   ALKPHOS 86 03/12/2014 1737   ALKPHOS 106* 10/25/2011 1200   AST 18 12/02/2014 1309   AST 24 03/12/2014 1737   AST 25 10/25/2011 1200   ALT 12 12/02/2014 1309   ALT 15 03/12/2014 1737   ALT 14 10/25/2011 1200   BILITOT 0.39 12/02/2014  1309   BILITOT 0.3 03/12/2014 1737   BILITOT 0.60 10/25/2011 1200       RADIOGRAPHIC STUDIES: Ct Chest Wo Contrast  12/02/2014   CLINICAL DATA:  Restaging lung cancer. History of right lung cancer initially diagnosed in 2012.  EXAM: CT CHEST WITHOUT CONTRAST  TECHNIQUE: Multidetector CT imaging of the chest was performed following the standard protocol without IV contrast.  COMPARISON:  07/29/2014 and 01/25/2014  FINDINGS: Chest wall: No breast masses, supraclavicular or axillary lymphadenopathy. The thyroid gland appears normal. The bony thorax is intact. No destructive bone lesions or spinal canal compromise. Remote thoracic compression fractures and vertebral augmentation changes. Stable changes involving the right posterior ribs, possibly related to radiation change. There is a ununited fracture involving the right fifth posterior rib.  Mediastinum: The heart is normal in size. Small amount of pericardial fluid but no overt effusion. The aorta demonstrates stable atherosclerotic calcifications. No aneurysm. Stable coronary artery calcifications. No enlarged mediastinal or hilar lymph nodes. Stable soft tissue density surrounding the right mainstem bronchus likely radiation fibrosis. I do not see any findings to suggest recurrent tumor.  Lungs/ pleura: Stable emphysematous changes. Stable surgical changes from a left upper lobe lobectomy. There is a stable right upper lobe ground-glass density measuring 7.3 x 4.5 cm. It previously measured 6.7 x 3.8 cm. This is worrisome for a slow growing adenocarcinoma. There are  small adjacent ground-glass nodules which have also enlarged. The nodule on image number 23 measures 8.5 mm. The nodule on image 27 measures 9.5 mm.  8 mm vague ground-glass nodule in the left upper lobe on image number 25 appears stable. No acute pulmonary findings. There is a small persistent right pleural effusion.  Upper abdomen: Stable gallstones and advanced atherosclerotic  calcifications involving the aorta. There are stable bilateral adrenal gland adenomas.  IMPRESSION: 1. Continued enlargement of the ground-glass opacity in the right upper lobe with enlarging adjacent ground-glass nodules. This is most likely slow growing adenocarcinoma. 2. Stable surgical changes involving the left hemi thorax and stable radiation changes involving the right hemi thorax. 3. No mediastinal or hilar mass or adenopathy. 4. Small right pleural effusion. 5. Stable upper right rib changes but no other findings for bony metastatic disease. 6. Stable bilateral adrenal gland adenomas.   Electronically Signed   By: Marijo Sanes M.D.   On: 12/02/2014 14:02   ASSESSMENT AND PLAN: this is a very pleasant 69 years old white female with history of stage IIB/IIIA non-small cell lung cancer status post concurrent chemoradiation followed by consolidation chemotherapy with carboplatin and Alimta and has been observation since April 2013. She has no evidence for disease progression on his recent scan except for a continuous enlargement of the right upper lobe opacity. The molecular study for EGFR mutation was reported to be negative. I had a lengthy discussion with the patient and her daughter about her current condition and treatment options. I offered the patient treatment with systemic chemotherapy with carboplatin and Alimta versus continuous observation and close monitoring. The patient is interested in proceeding with systemic chemotherapy. I discussed with the patient adverse effects of this treatment including but not limited to alopecia, myelosuppression, nausea and vomiting, peripheral neuropathy, liver or renal dysfunction. We will arrange for the patient to receive vitamin B 12 injection today. The patient would also receive prescription for Compazine 10 mg by mouth every 6 hours as needed for nausea, Decadron 4 mg by mouth twice a day, the day before, day of and day after the chemotherapy in  addition to folic acid 1 mg by mouth daily. She is expected to start the first dose of her treatment on 01/02/2015. The patient would come back for follow-up visit in 2 weeks for reevaluation and management of any adverse effect of her treatment. She was advised to call immediately if she has any concerning symptoms in the interval. The patient voices understanding of current disease status and treatment options and is in agreement with the current care plan.  All questions were answered. The patient knows to call the clinic with any problems, questions or concerns. We can certainly see the patient much sooner if necessary.  Disclaimer: This note was dictated with voice recognition software. Similar sounding words can inadvertently be transcribed and may not be corrected upon review.

## 2014-12-24 ENCOUNTER — Telehealth: Payer: Self-pay | Admitting: *Deleted

## 2014-12-24 NOTE — Telephone Encounter (Signed)
Patient called requesting a letter to give to [facility] requesting more nursing hours. Cancer Dr. Rockey Situ her the cancer is growing again and she is going to need to start Chemo. She wants a letter requesting more hours due to it making her weak and sick. Please Advise.

## 2014-12-25 ENCOUNTER — Encounter: Payer: Self-pay | Admitting: Internal Medicine

## 2014-12-25 NOTE — Telephone Encounter (Signed)
Faxed Letter along with Request for independent assessment for personal care Services Form to [employer] at Fax: [phone]. #:[phone]

## 2014-12-25 NOTE — Telephone Encounter (Signed)
LMOM for patient to return call regarding if she wants to pick up the letter or where we need to send it.

## 2014-12-26 ENCOUNTER — Telehealth: Payer: Self-pay | Admitting: *Deleted

## 2014-12-26 NOTE — Telephone Encounter (Signed)
Patient needs clarification on folic acid.  Does she take this once a day or twice a day for 7 days?  Called patient and let her know that she takes this once a day, continuously until [name] tells her to discontinue this.

## 2015-01-01 ENCOUNTER — Telehealth: Payer: Self-pay | Admitting: Medical Oncology

## 2015-01-01 ENCOUNTER — Telehealth: Payer: Self-pay | Admitting: General Surgery

## 2015-01-01 NOTE — Telephone Encounter (Signed)
Pt reports she is not feeling well with fever, sore throat, head congestion and sinusitis.Onc tx request sent to r/s to next week.

## 2015-01-01 NOTE — Telephone Encounter (Signed)
Patient confirmed appointment for 09/29 & 09/30

## 2015-01-02 ENCOUNTER — Other Ambulatory Visit: Payer: Medicare Other

## 2015-01-02 ENCOUNTER — Ambulatory Visit: Payer: Medicare Other

## 2015-01-02 ENCOUNTER — Encounter: Payer: Self-pay | Admitting: Nurse Practitioner

## 2015-01-02 ENCOUNTER — Ambulatory Visit (INDEPENDENT_AMBULATORY_CARE_PROVIDER_SITE_OTHER): Payer: Medicare Other | Admitting: Nurse Practitioner

## 2015-01-02 VITALS — BP 116/32 | HR 93 | Temp 98.8°F | Resp 20 | Ht 66.0 in | Wt 173.4 lb

## 2015-01-02 DIAGNOSIS — J019 Acute sinusitis, unspecified: Secondary | ICD-10-CM | POA: Diagnosis not present

## 2015-01-02 DIAGNOSIS — I779 Disorder of arteries and arterioles, unspecified: Secondary | ICD-10-CM

## 2015-01-02 MED ORDER — AMOXICILLIN-POT CLAVULANATE 875-125 MG PO TABS: 1.0000 | ORAL_TABLET | Freq: Two times a day (BID) | ORAL | Status: DC

## 2015-01-02 MED ORDER — FERROUS FUMARATE-FOLIC ACID 324-1 MG PO TABS: 1.0000 | ORAL_TABLET | Freq: Every day | ORAL | Status: DC

## 2015-01-02 NOTE — Patient Instructions (Signed)
To use nettipot twice daily May use plain saline spray as needed To take Augmentin 875/125 by mouth twice daily until complete    Sinusitis Sinusitis is redness, soreness, and inflammation of the paranasal sinuses. Paranasal sinuses are air pockets within the bones of your face (beneath the eyes, the middle of the forehead, or above the eyes). In healthy paranasal sinuses, mucus is able to drain out, and air is able to circulate through them by way of your nose. However, when your paranasal sinuses are inflamed, mucus and air can become trapped. This can allow bacteria and other germs to grow and cause infection. Sinusitis can develop quickly and last only a short time (acute) or continue over a long period (chronic). Sinusitis that lasts for more than 12 weeks is considered chronic.  CAUSES  Causes of sinusitis include:  Allergies.  Structural abnormalities, such as displacement of the cartilage that separates your nostrils (deviated septum), which can decrease the air flow through your nose and sinuses and affect sinus drainage.  Functional abnormalities, such as when the small hairs (cilia) that line your sinuses and help remove mucus do not work properly or are not present. SIGNS AND SYMPTOMS  Symptoms of acute and chronic sinusitis are the same. The primary symptoms are pain and pressure around the affected sinuses. Other symptoms include:  Upper toothache.  Earache.  Headache.  Bad breath.  Decreased sense of smell and taste.  A cough, which worsens when you are lying flat.  Fatigue.  Fever.  Thick drainage from your nose, which often is green and may contain pus (purulent).  Swelling and warmth over the affected sinuses. DIAGNOSIS  Your health care provider will perform a physical exam. During the exam, your health care provider may:  Look in your nose for signs of abnormal growths in your nostrils (nasal polyps).  Tap over the affected sinus to check for signs of  infection.  View the inside of your sinuses (endoscopy) using an imaging device that has a light attached (endoscope). If your health care provider suspects that you have chronic sinusitis, one or more of the following tests may be recommended:  Allergy tests.  Nasal culture. A sample of mucus is taken from your nose, sent to a lab, and screened for bacteria.  Nasal cytology. A sample of mucus is taken from your nose and examined by your health care provider to determine if your sinusitis is related to an allergy. TREATMENT  Most cases of acute sinusitis are related to a viral infection and will resolve on their own within 10 days. Sometimes medicines are prescribed to help relieve symptoms (pain medicine, decongestants, nasal steroid sprays, or saline sprays).  However, for sinusitis related to a bacterial infection, your health care provider will prescribe antibiotic medicines. These are medicines that will help kill the bacteria causing the infection.  Rarely, sinusitis is caused by a fungal infection. In theses cases, your health care provider will prescribe antifungal medicine. For some cases of chronic sinusitis, surgery is needed. Generally, these are cases in which sinusitis recurs more than 3 times per year, despite other treatments. HOME CARE INSTRUCTIONS   Drink plenty of water. Water helps thin the mucus so your sinuses can drain more easily.  Use a humidifier.  Inhale steam 3 to 4 times a day (for example, sit in the bathroom with the shower running).  Apply a warm, moist washcloth to your face 3 to 4 times a day, or as directed by your health care  provider.  Use saline nasal sprays to help moisten and clean your sinuses.  Take medicines only as directed by your health care provider.  If you were prescribed either an antibiotic or antifungal medicine, finish it all even if you start to feel better. SEEK IMMEDIATE MEDICAL CARE IF:  You have increasing pain or severe  headaches.  You have nausea, vomiting, or drowsiness.  You have swelling around your face.  You have vision problems.  You have a stiff neck.  You have difficulty breathing. MAKE SURE YOU:   Understand these instructions.  Will watch your condition.  Will get help right away if you are not doing well or get worse. Document Released: 03/29/2005 Document Revised: 08/13/2013 Document Reviewed: 04/13/2011 [facility] Patient Information 2015 [place], [place]. This information is not intended to replace advice given to you by your health care provider. Make sure you discuss any questions you have with your health care provider.

## 2015-01-02 NOTE — Progress Notes (Signed)
Patient ID: [name], female   DOB: 09/15/1945, 69 y.o.   MRN: [id number]    PCP: Estill Dooms, MD  Advanced Directive information Does patient have an advance directive?: No, Would patient like information on creating an advanced directive?: Yes - Educational materials given  Allergies  Allergen Reactions  . Iohexol Shortness Of Breath and Other (See Comments)    "burns me up inside"    Pt does fine with 13 hour prep  01/17/12  . Other     CAN'T STAND THE SMELL OF PURE BLEACH; "HAVE TO BACK UP AND POUR AND DON'T BREATH IT TIL i GET CAP BACK ON; DILUTE IT TO SPRAY"  . Anoro Ellipta [Umeclidinium-Vilanterol] Swelling    Swelling of lips and mouth  . Codeine Itching and Nausea And Vomiting    REACTION: GI upset  . Protonix [Pantoprazole Sodium] Diarrhea    Chief Complaint  Patient presents with  . Acute Visit    Patient running fever and sinus infection  . OTHER    Patient says she will be having chemotherapy next friday      HPI: Patient is a 69 y.o. female seen in the office today due to feeling poorly for about 1 week but got worse yesterday. Felt like she had a fever yesterday. Cough, congestion, green discharge from nose, sore throat.  Pt reports hx chronic sinusitis. Had to cancel chemo yesterday and needs to receive it next week.  No worsening of shortness of breath, no chest pains. Overall feels poorly. Still able to eat and drink   Review of Systems:  Review of Systems  Constitutional: Positive for fever, chills, activity change and fatigue.  HENT: Positive for congestion, postnasal drip, rhinorrhea, sinus pressure and sore throat.        Fluids in ear  Respiratory: Positive for cough and shortness of breath (shortness of breath at baseline, no worsening of symptoms).   Cardiovascular: Positive for leg swelling. Negative for chest pain and palpitations.  Gastrointestinal: Negative for vomiting, diarrhea and constipation.  Neurological: Negative for dizziness,  light-headedness and headaches.    Past Medical History  Diagnosis Date  . 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 Restless legs syndrome (RLS)   . Chronic sinusitis   . Hypertension   . Hyperlipidemia   . Anemia, unspecified   . GERD (gastroesophageal reflux disease)   . Respiratory failure with hypoxia 01/02/2010  . Complication of anesthesia     slow to wake up  . Asthma   . Coronary artery disease     Dr. Burt Knack had stress test done  . Type II or unspecified type diabetes mellitus without mention of complication, not stated as uncontrolled   . Peripheral vascular disease   . PAD (peripheral artery disease)   . Heart murmur   . Dysrhythmia     irregular  . Anginal pain   . History of blood clots     "2 in my aorta"  . Hard of hearing, right   . History of bronchitis   . Bronchial pneumonia     history  . COPD (chronic obstructive pulmonary disease)   . Emphysema   . Shortness of breath     "anytime really"  . Syncope and collapse   . Adenocarcinoma, lung     upper left lobe  . 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 Chemotherapy adverse reaction 09/15/11    "makes me light headed and pass  out; this is 3rd blood transfusion since going thru it"  . History of blood transfusion   . Kidney infection     "related to chemo"  . Sinus headache     "all the time"  . Arthritis     "all over my body"  . Fibromyalgia   . Osteoporosis   . DDD (degenerative disc disease), lumbar   . Scoliosis   . Anxiety   . Mental disorder   . Reflux   . CHF (congestive heart failure)   . DVT (deep venous thrombosis)   . Hx of radiation therapy 04/28/11 -06/08/11    right upper lobe-lung  . Acute upper respiratory infections of unspecified site   . Pain in thoracic spine   . Asymptomatic varicose veins   . Functional urinary incontinence   . Diseases of lips   . Other abnormal blood chemistry   . Effects of radiation, unspecified   . Anemia in  neoplastic disease   . Muscle weakness (generalized)   . Dizziness and giddiness   . Spasm of muscle   . Unspecified vitamin D deficiency   . Hyperpotassemia   . Acute posthemorrhagic anemia   . Unspecified hearing loss   . Disorder of bone and cartilage, unspecified   . Screening for thyroid disorder   . Malignant neoplasm of bronchus and lung, unspecified site   . Hypopotassemia   . Atherosclerosis of native arteries of the extremities, unspecified   . Reflux esophagitis   . Unspecified constipation   . Type II or unspecified type diabetes mellitus with neurological manifestations, uncontrolled   . Unspecified hereditary and idiopathic peripheral neuropathy   . Urinary tract infection, site not specified   . Muscle weakness (generalized)   . Acute sinusitis, unspecified   . Herpes zoster without mention of complication   . 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 Type II or unspecified type diabetes mellitus without mention of complication, not stated as uncontrolled   . Other and unspecified hyperlipidemia   . Anxiety state, unspecified   . Depressive disorder, not elsewhere classified   . Unspecified essential hypertension   . Atrial fibrillation   . Chronic airway obstruction, not elsewhere classified   . Osteoarthrosis, unspecified whether generalized or localized, unspecified site   . Lumbago   . Hip pain, chronic 09/17/2014   Past Surgical History  Procedure Laterality Date  . Lung lobectomy  1986    left  . Septoplasty    . Total hip arthroplasty  01/2010    left  . Bronchoscopy  02/2011  . Dilation and curettage of uterus  1980's  . Tubal ligation  1980's  . Cardiac catheterization    . Fetal blood transfusion  09/16/11  . Joint replacement  04/13/10    Left Hip  . Cataract extraction  03/12/2013    Left Eye   . Eye surgery Left 2014    Cataract   Social History:   reports that she quit smoking about 17 years ago. Her smoking use included Cigarettes. She has  a 60 pack-year smoking history. She has never used smokeless tobacco. She reports that she does not drink alcohol or use illicit drugs.  Family History  Problem Relation Age of Onset  . Bone cancer Father   . Cancer Father     Bone  . Diabetes Mother   . Deep vein thrombosis Mother   . Heart disease Mother     Heart Disease before age  60-  PVD  . Hyperlipidemia Mother   . Hypertension Mother   . Varicose Veins Mother   . Arthritis Daughter     Medications: Patient's Medications  New Prescriptions   No medications on file  Previous Medications   ALBUTEROL-IPRATROPIUM (COMBIVENT) 18-103 MCG/ACT INHALER    Inhale 2 puffs into the lungs every 4 (four) hours as needed (lung cancer).    ALPRAZOLAM (XANAX) 1 MG TABLET    TAKE 1 TABLET BY MOUTH THREE TIMES A DAY AS NEEDED FOR ANXIETY   AMBULATORY NON FORMULARY MEDICATION    Lighter Weight Rollator with Padded Seat and Hand Brakes Dx: 496, 729.1, 722.52   AMBULATORY NON FORMULARY MEDICATION    Raised Commode Seat with Rail Dx: M51.36, M54.5   ASPIRIN 81 MG TABLET    Take 81 mg by mouth daily.   CALCIUM CITRATE-VITAMIN D (CITRACAL + D PO)    Take 2 tablets by mouth daily.   CETIRIZINE-PSEUDOEPHEDRINE (ZYRTEC-D) 5-120 MG PER TABLET    Take 1 tablet by mouth 2 (two) times daily. To help allergy and congestion   CITALOPRAM (CELEXA) 20 MG TABLET    TAKE 1 TABLET BY MOUTH EVERY DAY TO HELP MOOD   COMBIVENT RESPIMAT 20-100 MCG/ACT AERS RESPIMAT    Inhale 1 puff into the lungs every 6 (six) hours as needed for wheezing.    DEXAMETHASONE (DECADRON) 4 MG TABLET    4 mg by mouth twice a day the day before, day of and day after the chemotherapy every 3 weeks.   DILTIAZEM (CARDIZEM CD) 240 MG 24 HR CAPSULE    TAKE 1 CAPSULE BY MOUTH EVERY DAY FOR BLOOD PRESSURE   FLUTICASONE (FLONASE) 50 MCG/ACT NASAL SPRAY    INSTILL 2 SPRAYS INTO BOTH NOSTRILS  TWICE DAILY   FOLIC ACID (FOLVITE) 1 MG TABLET    Take 1 tablet (1 mg total) by mouth daily.   GEMFIBROZIL  (LOPID) 600 MG TABLET    TAKE 1 TABLET BY MOUTH ONCE DAILY   GLUCOSE BLOOD (FREESTYLE LITE) TEST STRIP    Use to test blood sugar three times daily. Dx: E11.9, E11.49   GLUCOSE BLOOD TEST STRIP    Check blood sugar once daily  DX: 009.38   HEMATINIC/FOLIC ACID 182-9 MG TABS    TAKE 1 TABLET BY MOUTH DAILY   HYDROCODONE-ACETAMINOPHEN (NORCO) 10-325 MG PER TABLET    Take One tablet by mouth every 6 hours if needed for pain   LANCETS (FREESTYLE) LANCETS    Use to test blood sugar three times daily. Dx: E11.9, E11.49   LORATADINE (CLARITIN) 10 MG TABLET    TAKE 1 TABLET BY MOUTH EVERY DAY   LOSARTAN (COZAAR) 50 MG TABLET    TAKE 1 TABLET BY MOUTH TWICE DAILY   MELOXICAM (MOBIC) 15 MG TABLET    One daily to help arthritis   METFORMIN (GLUCOPHAGE) 500 MG TABLET    TAKE 1 TABLET BY MOUTH 2 TIMES A DAY   METOCLOPRAMIDE (REGLAN) 5 MG TABLET    TAKE 1 TABLET BY MOUTH 4 TIMES A DAY   MULTIPLE VITAMINS-MINERALS (CENTRUM SILVER PO)    Take 1 tablet by mouth daily. Patient unsure of dose   NON FORMULARY    Place 3 L into the nose daily. Oxygen   POTASSIUM CHLORIDE (K-DUR,KLOR-CON) 10 MEQ TABLET    TAKE 1 TABLET BY MOUTH EVERY DAY   PREDNISONE (DELTASONE) 50 MG TABLET    Take one tablet 13 hours , one tablet  7 hours and one tablet 1 hour prior to CT scan   PROCHLORPERAZINE (COMPAZINE) 10 MG TABLET    Take 1 tablet (10 mg total) by mouth every 6 (six) hours as needed for nausea or vomiting.   RANITIDINE (ZANTAC) 300 MG TABLET    Take 1 tablet (300 mg total) by mouth at bedtime.   TRAMADOL (ULTRAM) 50 MG TABLET    TAKE 1 TABLET BY MOUTH THREE TIMES A DAY AS NEEDED FOR PAIN   TRIAMTERENE-HYDROCHLOROTHIAZIDE (MAXZIDE-25) 37.5-25 MG PER TABLET    TAKE 1 TABLET BY MOUTH EVERY DAY TO CONTROL BLOOD PRESSURE  Modified Medications   No medications on file  Discontinued Medications   No medications on file     Physical Exam:  Filed Vitals:   01/02/15 1414  BP: 116/32  Pulse: 93  Temp: 98.8 F (37.1 C)    TempSrc: Oral  Resp: 20  Height: '5\' 6"'$  (1.676 m)  Weight: 173 lb 6.4 oz (78.654 kg)  SpO2: 97%   Body mass index is 28 kg/(m^2).  Physical Exam  Constitutional: She is oriented to person, place, and time. She appears well-developed and well-nourished. No distress.  HENT:  Head: Normocephalic and atraumatic.  Mouth/Throat: Oropharynx is clear and moist. No oropharyngeal exudate.  Eyes: Conjunctivae are normal. Pupils are equal, round, and reactive to light.  Neck: Normal range of motion. Neck supple.  Cardiovascular: Normal rate, regular rhythm and normal heart sounds.   Pulmonary/Chest: Effort normal and breath sounds normal.  Abdominal: Soft. Bowel sounds are normal.  Musculoskeletal: She exhibits no edema or tenderness.  Neurological: She is alert and oriented to person, place, and time.  Skin: Skin is warm and dry. She is not diaphoretic.  Psychiatric: She has a normal mood and affect.    Labs reviewed: Basic Metabolic Panel:  Recent Labs  03/12/14 1737 07/29/14 1332 12/02/14 1309 12/23/14 1335  NA 142 140 143 141  K 3.9 4.1 4.2 4.0  CL 97  --   --   --   CO2 '27 22 26 28  '$ GLUCOSE 98 200* 100 106  BUN 15 16.8 15.9 18.7  CREATININE 1.10* 1.3* 1.1 1.1  CALCIUM 10.5* 10.0 10.1 10.3   Liver Function Tests:  Recent Labs  07/29/14 1332 12/02/14 1309 12/23/14 1335  AST '14 18 19  '$ ALT '10 12 14  '$ ALKPHOS 83 101 108  BILITOT 0.26 0.39 0.42  PROT 7.0 6.6 6.9  ALBUMIN 4.0 3.8 4.0   No results for input(s): LIPASE, AMYLASE in the last 8760 hours. No results for input(s): AMMONIA in the last 8760 hours. CBC:  Recent Labs  07/29/14 1332 12/02/14 1309 12/23/14 1335  WBC 8.6 7.2 8.8  NEUTROABS 7.5* 4.7 6.1  HGB 11.9 11.1* 12.0  HCT 36.1 33.2* 35.7  MCV 89.7 90.2 90.4  PLT 267 319 343   Lipid Panel: No results for input(s): CHOL, HDL, LDLCALC, TRIG, CHOLHDL, LDLDIRECT in the last 8760 hours. TSH: No results for input(s): TSH in the last 8760  hours. A1C: Lab Results  Component Value Date   HGBA1C 5.7* 12/18/2013     Assessment/Plan 1. Acute sinusitis, recurrence not specified, unspecified location -cont supportive care -drink plenty of fluids -use nettipot twice daily -plain nasal saline as needed throughout the day - amoxicillin-clavulanate (AUGMENTIN) 875-125 MG per tablet; Take 1 tablet by mouth 2 (two) times daily.  Dispense: 20 tablet; Refill: 0 -follow up precautions discussed and pt understands  Jessica K. Harle Battiest  [facility] [phone] 8 am - 5 pm) [phone] (after hours)

## 2015-01-03 ENCOUNTER — Ambulatory Visit: Payer: Medicare Other | Admitting: Emergency Medicine

## 2015-01-09 ENCOUNTER — Ambulatory Visit (HOSPITAL_BASED_OUTPATIENT_CLINIC_OR_DEPARTMENT_OTHER): Payer: Medicare Other | Admitting: Nurse Practitioner

## 2015-01-09 ENCOUNTER — Other Ambulatory Visit (HOSPITAL_BASED_OUTPATIENT_CLINIC_OR_DEPARTMENT_OTHER): Payer: Medicare Other

## 2015-01-09 ENCOUNTER — Telehealth: Payer: Self-pay | Admitting: Internal Medicine

## 2015-01-09 ENCOUNTER — Encounter: Payer: Self-pay | Admitting: Nurse Practitioner

## 2015-01-09 VITALS — BP 112/87 | HR 96 | Temp 98.4°F | Resp 19 | Ht 66.0 in | Wt 171.2 lb

## 2015-01-09 DIAGNOSIS — C3491 Malignant neoplasm of unspecified part of right bronchus or lung: Secondary | ICD-10-CM

## 2015-01-09 DIAGNOSIS — I779 Disorder of arteries and arterioles, unspecified: Secondary | ICD-10-CM

## 2015-01-09 DIAGNOSIS — C3490 Malignant neoplasm of unspecified part of unspecified bronchus or lung: Secondary | ICD-10-CM

## 2015-01-09 LAB — CBC WITH DIFFERENTIAL/PLATELET
BASO%: 0.4 % (ref 0.0–2.0)
Basophils Absolute: 0 10*3/uL (ref 0.0–0.1)
EOS%: 2.6 % (ref 0.0–7.0)
Eosinophils Absolute: 0.2 10*3/uL (ref 0.0–0.5)
HEMATOCRIT: 36.5 % (ref 34.8–46.6)
HEMOGLOBIN: 12.1 g/dL (ref 11.6–15.9)
LYMPH#: 2.3 10*3/uL (ref 0.9–3.3)
LYMPH%: 26.5 % (ref 14.0–49.7)
MCH: 30.4 pg (ref 25.1–34.0)
MCHC: 33.2 g/dL (ref 31.5–36.0)
MCV: 91.7 fL (ref 79.5–101.0)
MONO#: 0.6 10*3/uL (ref 0.1–0.9)
MONO%: 7.5 % (ref 0.0–14.0)
NEUT#: 5.4 10*3/uL (ref 1.5–6.5)
NEUT%: 63 % (ref 38.4–76.8)
Platelets: 326 10*3/uL (ref 145–400)
RBC: 3.98 10*6/uL (ref 3.70–5.45)
RDW: 12.5 % (ref 11.2–14.5)
WBC: 8.5 10*3/uL (ref 3.9–10.3)

## 2015-01-09 LAB — COMPREHENSIVE METABOLIC PANEL (CC13)
ALT: 13 U/L (ref 0–55)
AST: 19 U/L (ref 5–34)
Albumin: 4.1 g/dL (ref 3.5–5.0)
Alkaline Phosphatase: 93 U/L (ref 40–150)
Anion Gap: 11 mEq/L (ref 3–11)
BUN: 14.2 mg/dL (ref 7.0–26.0)
CALCIUM: 10.3 mg/dL (ref 8.4–10.4)
CHLORIDE: 100 meq/L (ref 98–109)
CO2: 29 meq/L (ref 22–29)
Creatinine: 1.1 mg/dL (ref 0.6–1.1)
EGFR: 49 mL/min/{1.73_m2} — ABNORMAL LOW (ref >90)
Glucose: 101 mg/dl (ref 70–140)
POTASSIUM: 3.9 meq/L (ref 3.5–5.1)
SODIUM: 141 meq/L (ref 136–145)
Total Bilirubin: 0.43 mg/dL (ref 0.20–1.20)
Total Protein: 7 g/dL (ref 6.4–8.3)

## 2015-01-09 NOTE — Progress Notes (Signed)
  [facility] OFFICE PROGRESS NOTE   DIAGNOSIS: Progressive non-small cell lung cancer initially diagnosed as Stage IIB/IIIA non-small cell lung cancer consistent with adenocarcinoma with negative EGFR mutation and negative ALK gene translocation diagnosed in December 2012.   PRIOR THERAPY:  1) Status post radiation therapy to the right upper lobe lung mass completed on 06/04/2011 under the care of Dr. Pablo Ledger.  2) Systemic chemotherapy with carboplatin for AUC of 5 and Alimta 500 mg/M2 giving every 3 weeks, status post 3 cycles, last dose was given on 07/21/2011.   CURRENT THERAPY: Systemic chemotherapy with carboplatin for AUC of 5 and Alimta 500 MG/M2 every 3 weeks. First dose expected on 01/10/2015.   INTERVAL HISTORY:   [name] returns as scheduled. She was scheduled to receive cycle 1 carboplatin/Alimta 01/02/2015. She contacted the office on 01/01/2015 to request a one-week delay due to sinusitis. She is now scheduled for chemotherapy on 01/10/2015. The sinus symptoms are better. She has stable dyspnea on exertion. She continues supplemental oxygen, 2 L when she is using the portable unit and 3 L when she is at home. She has occasional mild nausea. No mouth sores. Bowels moving regularly.  Objective:  Vital signs in last 24 hours:  Blood pressure 112/87, pulse 96, temperature 98.4 F (36.9 C), temperature source Oral, resp. rate 19, height $RemoveBe'5\' 6"'POSMaKsHt$  (1.676 m), weight 171 lb 3.2 oz (77.656 kg), SpO2 98 %.    HEENT: No thrush or ulcers. Resp: Distant breath sounds. No wheezes. Cardio: Regular rate and rhythm. GI: Abdomen soft and nontender. No hepatomegaly. Vascular: No leg edema. Neuro: Alert and oriented.  Skin: No rash.    Lab Results:  Lab Results  Component Value Date   WBC 8.5 01/09/2015   HGB 12.1 01/09/2015   HCT 36.5 01/09/2015   MCV 91.7 01/09/2015   PLT 326 01/09/2015   NEUTROABS 5.4 01/09/2015    Imaging:  No results  found.  Medications: I have reviewed the patient's current medications.  Assessment/Plan: 1. History of stage IIB/IIIA non-small cell lung cancer status post concurrent chemoradiation followed by consolidation chemotherapy with carboplatin and Alimta on observation since April 2013. Restaging CT scan 12/02/2014 showed continued enlargement of the groundglass opacity in the right upper lobe with enlarging adjacent groundglass nodules. The molecular study for EGFR mutation was negative. Cycle 1 carboplatin/Alimta planned 01/10/2015.   Disposition: [name] appears stable. The plan is to proceed with cycle 1 carboplatin/Alimta 01/10/2015. She will return for a follow-up visit and labs in one week. She will contact the office in the interim with any problems.  Plan reviewed with [name].    Ned Card ANP/GNP-BC   01/09/2015  3:57 PM

## 2015-01-09 NOTE — Telephone Encounter (Signed)
Gave adn printed appt sched and avs fo rpt for SEpt thru [place]

## 2015-01-10 ENCOUNTER — Ambulatory Visit (HOSPITAL_BASED_OUTPATIENT_CLINIC_OR_DEPARTMENT_OTHER): Payer: Medicare Other

## 2015-01-10 VITALS — BP 155/63 | HR 97 | Temp 98.6°F | Resp 18

## 2015-01-10 DIAGNOSIS — C3411 Malignant neoplasm of upper lobe, right bronchus or lung: Secondary | ICD-10-CM

## 2015-01-10 DIAGNOSIS — Z5111 Encounter for antineoplastic chemotherapy: Secondary | ICD-10-CM | POA: Diagnosis present

## 2015-01-10 DIAGNOSIS — C3491 Malignant neoplasm of unspecified part of right bronchus or lung: Secondary | ICD-10-CM

## 2015-01-10 MED ORDER — SODIUM CHLORIDE 0.9 % IV SOLN
Freq: Once | INTRAVENOUS | Status: AC
  Administered 2015-01-10: 12:00:00 via INTRAVENOUS
  Filled 2015-01-10: qty 8

## 2015-01-10 MED ORDER — SODIUM CHLORIDE 0.9 % IV SOLN
Freq: Once | INTRAVENOUS | Status: AC
  Administered 2015-01-10: 12:00:00 via INTRAVENOUS

## 2015-01-10 MED ORDER — SODIUM CHLORIDE 0.9 % IV SOLN
500.0000 mg/m2 | Freq: Once | INTRAVENOUS | Status: AC
  Administered 2015-01-10: 950 mg via INTRAVENOUS
  Filled 2015-01-10: qty 38

## 2015-01-10 MED ORDER — SODIUM CHLORIDE 0.9 % IV SOLN
414.5000 mg | Freq: Once | INTRAVENOUS | Status: AC
  Administered 2015-01-10: 410 mg via INTRAVENOUS
  Filled 2015-01-10: qty 41

## 2015-01-10 MED ORDER — SODIUM CHLORIDE 0.9 % IV SOLN: 400.0000 mg/m2 | Freq: Once | INTRAVENOUS | Status: DC

## 2015-01-10 NOTE — Patient Instructions (Signed)
[facility] Discharge Instructions for Patients Receiving Chemotherapy  Today you received the following chemotherapy agents Alimta and Carboplatin  To help prevent nausea and vomiting after your treatment, we encourage you to take your nausea medication as directed.  If you develop nausea and vomiting that is not controlled by your nausea medication, call the clinic.   BELOW ARE SYMPTOMS THAT SHOULD BE REPORTED IMMEDIATELY:  *FEVER GREATER THAN 100.5 F  *CHILLS WITH OR WITHOUT FEVER  NAUSEA AND VOMITING THAT IS NOT CONTROLLED WITH YOUR NAUSEA MEDICATION  *UNUSUAL SHORTNESS OF BREATH  *UNUSUAL BRUISING OR BLEEDING  TENDERNESS IN MOUTH AND THROAT WITH OR WITHOUT PRESENCE OF ULCERS  *URINARY PROBLEMS  *BOWEL PROBLEMS  UNUSUAL RASH Items with * indicate a potential emergency and should be followed up as soon as possible.  Feel free to call the clinic you have any questions or concerns. The clinic phone number is [phone].  Please show the  at check-in to the Emergency Department and triage nurse.    Pemetrexed injection What is this medicine? PEMETREXED (PEM e TREX ed) is a chemotherapy drug. This medicine affects cells that are rapidly growing, such as cancer cells and cells in your mouth and stomach. It is usually used to treat lung cancers like non-small cell lung cancer and mesothelioma. It may also be used to treat other cancers. This medicine may be used for other purposes; ask your health care provider or pharmacist if you have questions. COMMON BRAND NAME(S): Alimta What should I tell my health care provider before I take this medicine? They need to know if you have any of these conditions: -if you frequently drink alcohol containing beverages -infection (especially a virus infection such as chickenpox, cold sores, or herpes) -kidney disease -liver disease -low blood counts, like low platelets, red bloods, or white blood  cells -an unusual or allergic reaction to pemetrexed, mannitol, other medicines, foods, dyes, or preservatives -pregnant or trying to get pregnant -breast-feeding How should I use this medicine? This drug is given as an infusion into a vein. It is administered in a hospital or clinic by a specially trained health care professional. Talk to your pediatrician regarding the use of this medicine in children. Special care may be needed. Overdosage: If you think you have taken too much of this medicine contact a poison control center or emergency room at once. NOTE: This medicine is only for you. Do not share this medicine with others. What if I miss a dose? It is important not to miss your dose. Call your doctor or health care professional if you are unable to keep an appointment. What may interact with this medicine? -aspirin and aspirin-like medicines -medicines to increase blood counts like filgrastim, pegfilgrastim, sargramostim -methotrexate -NSAIDS, medicines for pain and inflammation, like ibuprofen or naproxen -probenecid -pyrimethamine -vaccines Talk to your doctor or health care professional before taking any of these medicines: -acetaminophen -aspirin -ibuprofen -ketoprofen -naproxen This list may not describe all possible interactions. Give your health care provider a list of all the medicines, herbs, non-prescription drugs, or dietary supplements you use. Also tell them if you smoke, drink alcohol, or use illegal drugs. Some items may interact with your medicine. What should I watch for while using this medicine? Visit your doctor for checks on your progress. This drug may make you feel generally unwell. This is not uncommon, as chemotherapy can affect healthy cells as well as cancer cells. Report any side effects. Continue your course of  treatment even though you feel ill unless your doctor tells you to stop. In some cases, you may be given additional medicines to help with side  effects. Follow all directions for their use. Call your doctor or health care professional for advice if you get a fever, chills or sore throat, or other symptoms of a cold or flu. Do not treat yourself. This drug decreases your body's ability to fight infections. Try to avoid being around people who are sick. This medicine may increase your risk to bruise or bleed. Call your doctor or health care professional if you notice any unusual bleeding. Be careful brushing and flossing your teeth or using a toothpick because you may get an infection or bleed more easily. If you have any dental work done, tell your dentist you are receiving this medicine. Avoid taking products that contain aspirin, acetaminophen, ibuprofen, naproxen, or ketoprofen unless instructed by your doctor. These medicines may hide a fever. Call your doctor or health care professional if you get diarrhea or mouth sores. Do not treat yourself. To protect your kidneys, drink water or other fluids as directed while you are taking this medicine. Men and women must use effective birth control while taking this medicine. You may also need to continue using effective birth control for a time after stopping this medicine. Do not become pregnant while taking this medicine. Tell your doctor right away if you think that you or your partner might be pregnant. There is a potential for serious side effects to an unborn child. Talk to your health care professional or pharmacist for more information. Do not breast-feed an infant while taking this medicine. This medicine may lower sperm counts. What side effects may I notice from receiving this medicine? Side effects that you should report to your doctor or health care professional as soon as possible: -allergic reactions like skin rash, itching or hives, swelling of the face, lips, or tongue -low blood counts - this medicine may decrease the number of white blood cells, red blood cells and platelets. You  may be at increased risk for infections and bleeding. -signs of infection - fever or chills, cough, sore throat, pain or difficulty passing urine -signs of decreased platelets or bleeding - bruising, pinpoint red spots on the skin, black, tarry stools, blood in the urine -signs of decreased red blood cells - unusually weak or tired, fainting spells, lightheadedness -breathing problems, like a dry cough -changes in emotions or moods -chest pain -confusion -diarrhea -high blood pressure -mouth or throat sores or ulcers -pain, swelling, warmth in the leg -pain on swallowing -swelling of the ankles, feet, hands -trouble passing urine or change in the amount of urine -vomiting -yellowing of the eyes or skin Side effects that usually do not require medical attention (report to your doctor or health care professional if they continue or are bothersome): -hair loss -loss of appetite -nausea -stomach upset This list may not describe all possible side effects. Call your doctor for medical advice about side effects. You may report side effects to FDA at 1-800-FDA-1088. Where should I keep my medicine? This drug is given in a hospital or clinic and will not be stored at home. NOTE: This sheet is a summary. It may not cover all possible information. If you have questions about this medicine, talk to your doctor, pharmacist, or health care provider.  2015, Elsevier/Gold Standard. (2007-10-31 13:24:03)   Carboplatin injection What is this medicine? CARBOPLATIN (KAR boe pla tin) is a chemotherapy drug. It  targets fast dividing cells, like cancer cells, and causes these cells to die. This medicine is used to treat ovarian cancer and many other cancers. This medicine may be used for other purposes; ask your health care provider or pharmacist if you have questions. COMMON BRAND NAME(S): Paraplatin What should I tell my health care provider before I take this medicine? They need to know if you have  any of these conditions: -blood disorders -hearing problems -kidney disease -recent or ongoing radiation therapy -an unusual or allergic reaction to carboplatin, cisplatin, other chemotherapy, other medicines, foods, dyes, or preservatives -pregnant or trying to get pregnant -breast-feeding How should I use this medicine? This drug is usually given as an infusion into a vein. It is administered in a hospital or clinic by a specially trained health care professional. Talk to your pediatrician regarding the use of this medicine in children. Special care may be needed. Overdosage: If you think you have taken too much of this medicine contact a poison control center or emergency room at once. NOTE: This medicine is only for you. Do not share this medicine with others. What if I miss a dose? It is important not to miss a dose. Call your doctor or health care professional if you are unable to keep an appointment. What may interact with this medicine? -medicines for seizures -medicines to increase blood counts like filgrastim, pegfilgrastim, sargramostim -some antibiotics like amikacin, gentamicin, neomycin, streptomycin, tobramycin -vaccines Talk to your doctor or health care professional before taking any of these medicines: -acetaminophen -aspirin -ibuprofen -ketoprofen -naproxen This list may not describe all possible interactions. Give your health care provider a list of all the medicines, herbs, non-prescription drugs, or dietary supplements you use. Also tell them if you smoke, drink alcohol, or use illegal drugs. Some items may interact with your medicine. What should I watch for while using this medicine? Your condition will be monitored carefully while you are receiving this medicine. You will need important blood work done while you are taking this medicine. This drug may make you feel generally unwell. This is not uncommon, as chemotherapy can affect healthy cells as well as cancer  cells. Report any side effects. Continue your course of treatment even though you feel ill unless your doctor tells you to stop. In some cases, you may be given additional medicines to help with side effects. Follow all directions for their use. Call your doctor or health care professional for advice if you get a fever, chills or sore throat, or other symptoms of a cold or flu. Do not treat yourself. This drug decreases your body's ability to fight infections. Try to avoid being around people who are sick. This medicine may increase your risk to bruise or bleed. Call your doctor or health care professional if you notice any unusual bleeding. Be careful brushing and flossing your teeth or using a toothpick because you may get an infection or bleed more easily. If you have any dental work done, tell your dentist you are receiving this medicine. Avoid taking products that contain aspirin, acetaminophen, ibuprofen, naproxen, or ketoprofen unless instructed by your doctor. These medicines may hide a fever. Do not become pregnant while taking this medicine. Women should inform their doctor if they wish to become pregnant or think they might be pregnant. There is a potential for serious side effects to an unborn child. Talk to your health care professional or pharmacist for more information. Do not breast-feed an infant while taking this medicine. What side  effects may I notice from receiving this medicine? Side effects that you should report to your doctor or health care professional as soon as possible: -allergic reactions like skin rash, itching or hives, swelling of the face, lips, or tongue -signs of infection - fever or chills, cough, sore throat, pain or difficulty passing urine -signs of decreased platelets or bleeding - bruising, pinpoint red spots on the skin, black, tarry stools, nosebleeds -signs of decreased red blood cells - unusually weak or tired, fainting spells, lightheadedness -breathing  problems -changes in hearing -changes in vision -chest pain -high blood pressure -low blood counts - This drug may decrease the number of white blood cells, red blood cells and platelets. You may be at increased risk for infections and bleeding. -nausea and vomiting -pain, swelling, redness or irritation at the injection site -pain, tingling, numbness in the hands or feet -problems with balance, talking, walking -trouble passing urine or change in the amount of urine Side effects that usually do not require medical attention (report to your doctor or health care professional if they continue or are bothersome): -hair loss -loss of appetite -metallic taste in the mouth or changes in taste This list may not describe all possible side effects. Call your doctor for medical advice about side effects. You may report side effects to FDA at 1-800-FDA-1088. Where should I keep my medicine? This drug is given in a hospital or clinic and will not be stored at home. NOTE: This sheet is a summary. It may not cover all possible information. If you have questions about this medicine, talk to your doctor, pharmacist, or health care provider.  2015, Elsevier/Gold Standard. (2007-07-04 14:38:05)

## 2015-01-13 ENCOUNTER — Other Ambulatory Visit: Payer: Self-pay

## 2015-01-13 DIAGNOSIS — M25552 Pain in left hip: Secondary | ICD-10-CM

## 2015-01-13 DIAGNOSIS — G44229 Chronic tension-type headache, not intractable: Secondary | ICD-10-CM

## 2015-01-13 DIAGNOSIS — M51369 Other intervertebral disc degeneration, lumbar region without mention of lumbar back pain or lower extremity pain: Secondary | ICD-10-CM

## 2015-01-13 DIAGNOSIS — M5136 Other intervertebral disc degeneration, lumbar region: Secondary | ICD-10-CM

## 2015-01-13 DIAGNOSIS — G8929 Other chronic pain: Secondary | ICD-10-CM

## 2015-01-13 MED ORDER — HYDROCODONE-ACETAMINOPHEN 10-325 MG PO TABS: ORAL_TABLET | ORAL | Status: DC

## 2015-01-13 NOTE — Telephone Encounter (Signed)
[name] called in to request a refill on her pain medication and for a copy of her medication list ,diagnois list, and her last encounter. Said she needed them to use to help her to ride with a [employer]. Copies made and given to her aide as she requested.

## 2015-01-14 ENCOUNTER — Telehealth: Payer: Self-pay | Admitting: Internal Medicine

## 2015-01-14 ENCOUNTER — Other Ambulatory Visit: Payer: Self-pay | Admitting: Internal Medicine

## 2015-01-14 ENCOUNTER — Telehealth: Payer: Self-pay | Admitting: Medical Oncology

## 2015-01-14 DIAGNOSIS — C3490 Malignant neoplasm of unspecified part of unspecified bronchus or lung: Secondary | ICD-10-CM

## 2015-01-14 NOTE — Telephone Encounter (Signed)
sw. pt and advised on 10.5 appt....pt ok and aware

## 2015-01-14 NOTE — Telephone Encounter (Signed)
Pt reports low grade fever, chest congestion. " If I take iron will that help the congestion ?"  She did not complete antibiotics from 9/22. I instructed her that iron will not help congestion and to finish her antibiotics and I will send order for [facility] tomorrow. I also told her to call for temp >100.5.

## 2015-01-15 ENCOUNTER — Other Ambulatory Visit: Payer: Medicare Other

## 2015-01-15 ENCOUNTER — Ambulatory Visit: Payer: Medicare Other | Admitting: Nurse Practitioner

## 2015-01-15 ENCOUNTER — Telehealth: Payer: Self-pay | Admitting: Internal Medicine

## 2015-01-15 NOTE — Telephone Encounter (Signed)
returned call and s.w. pt and cx todays appt she is feeling a little better and will just come tomorrow

## 2015-01-16 ENCOUNTER — Ambulatory Visit (HOSPITAL_BASED_OUTPATIENT_CLINIC_OR_DEPARTMENT_OTHER): Payer: Medicare Other | Admitting: Physician Assistant

## 2015-01-16 ENCOUNTER — Other Ambulatory Visit (HOSPITAL_BASED_OUTPATIENT_CLINIC_OR_DEPARTMENT_OTHER): Payer: Medicare Other

## 2015-01-16 ENCOUNTER — Other Ambulatory Visit: Payer: Medicare Other

## 2015-01-16 ENCOUNTER — Telehealth: Payer: Self-pay | Admitting: Physician Assistant

## 2015-01-16 DIAGNOSIS — K1321 Leukoplakia of oral mucosa, including tongue: Secondary | ICD-10-CM

## 2015-01-16 DIAGNOSIS — C3490 Malignant neoplasm of unspecified part of unspecified bronchus or lung: Secondary | ICD-10-CM

## 2015-01-16 DIAGNOSIS — C3411 Malignant neoplasm of upper lobe, right bronchus or lung: Secondary | ICD-10-CM

## 2015-01-16 LAB — COMPREHENSIVE METABOLIC PANEL (CC13)
ALBUMIN: 3.8 g/dL (ref 3.5–5.0)
ALK PHOS: 83 U/L (ref 40–150)
ALT: 11 U/L (ref 0–55)
AST: 14 U/L (ref 5–34)
Anion Gap: 11 mEq/L (ref 3–11)
BILIRUBIN TOTAL: 0.61 mg/dL (ref 0.20–1.20)
BUN: 36.7 mg/dL — AB (ref 7.0–26.0)
CALCIUM: 9.3 mg/dL (ref 8.4–10.4)
CO2: 27 mEq/L (ref 22–29)
CREATININE: 1.5 mg/dL — AB (ref 0.6–1.1)
Chloride: 101 mEq/L (ref 98–109)
EGFR: 34 mL/min/{1.73_m2} — ABNORMAL LOW (ref >90)
GLUCOSE: 97 mg/dL (ref 70–140)
POTASSIUM: 3.7 meq/L (ref 3.5–5.1)
SODIUM: 138 meq/L (ref 136–145)
TOTAL PROTEIN: 6.7 g/dL (ref 6.4–8.3)

## 2015-01-16 LAB — CBC WITH DIFFERENTIAL/PLATELET
BASO%: 0 % (ref 0.0–2.0)
Basophils Absolute: 0 10*3/uL (ref 0.0–0.1)
EOS%: 2.9 % (ref 0.0–7.0)
Eosinophils Absolute: 0.3 10*3/uL (ref 0.0–0.5)
HEMATOCRIT: 39.1 % (ref 34.8–46.6)
HEMOGLOBIN: 13.4 g/dL (ref 11.6–15.9)
LYMPH#: 3.9 10*3/uL — AB (ref 0.9–3.3)
LYMPH%: 37.9 % (ref 14.0–49.7)
MCH: 30.7 pg (ref 25.1–34.0)
MCHC: 34.3 g/dL (ref 31.5–36.0)
MCV: 89.5 fL (ref 79.5–101.0)
MONO#: 0.1 10*3/uL (ref 0.1–0.9)
MONO%: 0.7 % (ref 0.0–14.0)
NEUT#: 6 10*3/uL (ref 1.5–6.5)
NEUT%: 58.5 % (ref 38.4–76.8)
NRBC: 0 % (ref 0–0)
Platelets: 334 10*3/uL (ref 145–400)
RBC: 4.37 10*6/uL (ref 3.70–5.45)
RDW: 12.1 % (ref 11.2–14.5)
WBC: 10.2 10*3/uL (ref 3.9–10.3)

## 2015-01-16 LAB — HOLD TUBE, BLOOD BANK

## 2015-01-16 NOTE — Progress Notes (Signed)
[facility] OFFICE PROGRESS NOTE   DIAGNOSIS: Progressive non-small cell lung cancer initially diagnosed as Stage IIB/IIIA non-small cell lung cancer consistent with adenocarcinoma with negative EGFR mutation and negative ALK gene translocation diagnosed in December 2012.   PRIOR THERAPY:  1) Status post radiation therapy to the right upper lobe lung mass completed on 06/04/2011 under the care of Dr. Pablo Ledger.  2) Systemic chemotherapy with carboplatin for AUC of 5 and Alimta 500 mg/M2 giving every 3 weeks, status post 3 cycles, last dose was given on 07/21/2011.   CURRENT THERAPY: Systemic chemotherapy with carboplatin for AUC of 5 and Alimta 500 MG/M2 every 3 weeks. First dosegiven on 01/10/2015.   INTERVAL HISTORY:   [name] returns as scheduled. She was scheduled to receive cycle 1 carboplatin/Alimta 01/02/2015 but due to sinusitis this had been postponed to 9/30. The sinus symptoms are better. She has stable dyspnea on exertion. She continues supplemental oxygen, 2 L when she is using the portable unit and 3 L when she is at home.  She has been experiencing fevers up to 103 after her chemo, now resolved She has occasional nausea without vomiting, but admits to not taking any antiemetics to resolve symptoms. Denied diarrhea or constipation.  She has mild pharyngeal mucositis without open sores. 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   Objective:  Vital signs in last 24 hours:  Blood pressure 117/52, pulse 81, temperature 98.5 F (36.9 C), temperature source Oral, resp. rate 16, weight 168 lb 6 oz (76.374 kg), SpO2 100 %.    HEENT: No thrush or ulcers. Mild erythema noted in the oropharynx without exudate Resp: Distant breath sounds. No wheezes. Cardio: Regular rate and rhythm. GI: Abdomen soft and nontender. No hepatomegaly. Vascular: No leg edema. Neuro: Alert and oriented.  Skin: No rash.    Lab Results:  Lab Results  Component Value  Date   WBC 10.2 01/16/2015   HGB 13.4 01/16/2015   HCT 39.1 01/16/2015   MCV 89.5 01/16/2015   PLT 334 01/16/2015   NEUTROABS 6.0 01/16/2015    Imaging:  No results found.  Medications: I have reviewed the patient's current medications.  Assessment/Plan Stage IIB/IIIA non-small cell lung cancer  status post concurrent chemoradiation followed by consolidation chemotherapy with carboplatin and Alimta on observation since April 2013.  Restaging CT scan 12/02/2014 showed continued enlargement of the groundglass opacity in the right upper lobe with enlarging adjacent groundglass nodules.  The molecular study for EGFR mutation was negative.  She began Cycle 1 carboplatin/Alimta on  01/10/2015. She is scheduled for Cycle 2  On 01/30/15  Mucositis This is likely due to recent chemotherapy She was instructed to use magic mouthwash 4-5 times a day, swish and gargle supportively Will recheck in 2 weeks, to monitor progression  Nausea Secondary to chemotherapy She has not been taking antiemetics, was instructed to do so and monitor symptoms    Plan reviewed with [name]. This is a shared visit. Time spent greater than 25 minutes.    [facility] ANP/GNP-BC   01/16/2015  11:54 AM  ADDENDUM: Hematology/Oncology Attending: i had a face to face encounter with the patient today. I recommended her care planThis is a very pleasant 69 years old white female with recurrent non-small cell lung cancer, adenocarcinoma with negative EGFR mutation currently undergoing systemic chemotherapy with carboplatin and Alimta status post 1 cycle. The patient related the first cycle of her treatment well except for few episodes of nausea  as well as fatigue. She also has oral mucositis. Her nausea improved with the current antiemetics. For the oral mucositis, we recommended for the patient to use magic mouthwash 3-4 times a day. The patient would come back for follow-up visit in 2 weeks for  reevaluation before starting cycle #2. She was advised to call immediately if she has any concerning symptoms in the interval.  Disclaimer: This note was dictated with voice recognition software. Similar sounding words can inadvertently be transcribed and may be missed upon review. Eilleen Kempf., MD 01/17/2015

## 2015-01-16 NOTE — Telephone Encounter (Signed)
GAVE AND PRINTED APPT SCHEDE AND AVS FO RPT FOR oct THRU nov

## 2015-01-16 NOTE — Patient Instructions (Signed)
Return in 1 week for follow up on mild mucositis (red throat) and blood pressure.

## 2015-01-17 ENCOUNTER — Encounter: Payer: Self-pay | Admitting: Physician Assistant

## 2015-01-20 ENCOUNTER — Encounter: Payer: Self-pay | Admitting: Nurse Practitioner

## 2015-01-20 NOTE — Progress Notes (Signed)
Error. Pt cancelled.

## 2015-01-22 ENCOUNTER — Ambulatory Visit: Payer: Medicare Other | Admitting: Internal Medicine

## 2015-01-23 ENCOUNTER — Other Ambulatory Visit: Payer: Self-pay | Admitting: *Deleted

## 2015-01-23 ENCOUNTER — Other Ambulatory Visit (HOSPITAL_BASED_OUTPATIENT_CLINIC_OR_DEPARTMENT_OTHER): Payer: Medicare Other

## 2015-01-23 ENCOUNTER — Ambulatory Visit: Payer: Medicare Other

## 2015-01-23 ENCOUNTER — Ambulatory Visit: Payer: Medicare Other | Admitting: Nurse Practitioner

## 2015-01-23 DIAGNOSIS — C3490 Malignant neoplasm of unspecified part of unspecified bronchus or lung: Secondary | ICD-10-CM | POA: Diagnosis present

## 2015-01-23 LAB — COMPREHENSIVE METABOLIC PANEL (CC13)
ALBUMIN: 3.7 g/dL (ref 3.5–5.0)
ALK PHOS: 104 U/L (ref 40–150)
ALT: 83 U/L — ABNORMAL HIGH (ref 0–55)
AST: 65 U/L — ABNORMAL HIGH (ref 5–34)
Anion Gap: 15 mEq/L — ABNORMAL HIGH (ref 3–11)
BILIRUBIN TOTAL: 0.42 mg/dL (ref 0.20–1.20)
BUN: 18.3 mg/dL (ref 7.0–26.0)
CALCIUM: 10.4 mg/dL (ref 8.4–10.4)
CO2: 26 mEq/L (ref 22–29)
Chloride: 101 mEq/L (ref 98–109)
Creatinine: 1.2 mg/dL — ABNORMAL HIGH (ref 0.6–1.1)
EGFR: 44 mL/min/{1.73_m2} — AB (ref >90)
GLUCOSE: 113 mg/dL (ref 70–140)
POTASSIUM: 3.6 meq/L (ref 3.5–5.1)
SODIUM: 142 meq/L (ref 136–145)
TOTAL PROTEIN: 7 g/dL (ref 6.4–8.3)

## 2015-01-23 LAB — CBC WITH DIFFERENTIAL/PLATELET
BASO%: 0.1 % (ref 0.0–2.0)
BASOS ABS: 0 10*3/uL (ref 0.0–0.1)
EOS ABS: 0.1 10*3/uL (ref 0.0–0.5)
EOS%: 1.7 % (ref 0.0–7.0)
HEMATOCRIT: 31.8 % — AB (ref 34.8–46.6)
HEMOGLOBIN: 10.4 g/dL — AB (ref 11.6–15.9)
LYMPH#: 1.6 10*3/uL (ref 0.9–3.3)
LYMPH%: 49.6 % (ref 14.0–49.7)
MCH: 29.4 pg (ref 25.1–34.0)
MCHC: 32.8 g/dL (ref 31.5–36.0)
MCV: 89.6 fL (ref 79.5–101.0)
MONO#: 0.3 10*3/uL (ref 0.1–0.9)
MONO%: 7.9 % (ref 0.0–14.0)
NEUT%: 40.7 % (ref 38.4–76.8)
NEUTROS ABS: 1.3 10*3/uL — AB (ref 1.5–6.5)
Platelets: 90 10*3/uL — ABNORMAL LOW (ref 145–400)
RBC: 3.55 10*6/uL — ABNORMAL LOW (ref 3.70–5.45)
RDW: 12.1 % (ref 11.2–14.5)
WBC: 3.2 10*3/uL — AB (ref 3.9–10.3)

## 2015-01-23 LAB — HOLD TUBE, BLOOD BANK

## 2015-01-24 ENCOUNTER — Telehealth: Payer: Self-pay | Admitting: Medical Oncology

## 2015-01-24 ENCOUNTER — Telehealth: Payer: Self-pay | Admitting: *Deleted

## 2015-01-24 ENCOUNTER — Encounter: Payer: Self-pay | Admitting: Internal Medicine

## 2015-01-24 NOTE — Telephone Encounter (Signed)
Reviewed labs from yesterday .

## 2015-01-24 NOTE — Telephone Encounter (Signed)
Patient called and stated that she needs a letter stating that she medically needs her dog for the apartment she is moving to due to her Hearing or she has to pay $300.00. Please Advise.

## 2015-01-24 NOTE — Telephone Encounter (Signed)
Patient notified to pick up 

## 2015-01-24 NOTE — Telephone Encounter (Signed)
This letter was dictated today and printed off at [facility].

## 2015-01-27 ENCOUNTER — Other Ambulatory Visit: Payer: Self-pay | Admitting: Medical Oncology

## 2015-01-27 DIAGNOSIS — C3490 Malignant neoplasm of unspecified part of unspecified bronchus or lung: Secondary | ICD-10-CM

## 2015-01-27 MED ORDER — FERROUS FUMARATE-FOLIC ACID 324-1 MG PO TABS: 1.0000 | ORAL_TABLET | Freq: Every day | ORAL | Status: DC

## 2015-01-27 MED ORDER — PROCHLORPERAZINE MALEATE 10 MG PO TABS: 10.0000 mg | ORAL_TABLET | Freq: Four times a day (QID) | ORAL | Status: DC | PRN

## 2015-01-30 ENCOUNTER — Telehealth: Payer: Self-pay | Admitting: Internal Medicine

## 2015-01-30 ENCOUNTER — Other Ambulatory Visit (HOSPITAL_BASED_OUTPATIENT_CLINIC_OR_DEPARTMENT_OTHER): Payer: Medicare Other

## 2015-01-30 ENCOUNTER — Ambulatory Visit (HOSPITAL_BASED_OUTPATIENT_CLINIC_OR_DEPARTMENT_OTHER): Payer: Medicare Other

## 2015-01-30 ENCOUNTER — Ambulatory Visit: Payer: Medicare Other | Admitting: Nutrition

## 2015-01-30 ENCOUNTER — Other Ambulatory Visit: Payer: Self-pay | Admitting: Internal Medicine

## 2015-01-30 VITALS — BP 151/58 | HR 83 | Temp 98.7°F | Resp 20

## 2015-01-30 DIAGNOSIS — C3411 Malignant neoplasm of upper lobe, right bronchus or lung: Secondary | ICD-10-CM | POA: Diagnosis present

## 2015-01-30 DIAGNOSIS — Z5111 Encounter for antineoplastic chemotherapy: Secondary | ICD-10-CM

## 2015-01-30 DIAGNOSIS — C3491 Malignant neoplasm of unspecified part of right bronchus or lung: Secondary | ICD-10-CM

## 2015-01-30 LAB — CBC WITH DIFFERENTIAL/PLATELET
BASO%: 0.2 % (ref 0.0–2.0)
BASOS ABS: 0 10*3/uL (ref 0.0–0.1)
EOS%: 0 % (ref 0.0–7.0)
Eosinophils Absolute: 0 10*3/uL (ref 0.0–0.5)
HCT: 29 % — ABNORMAL LOW (ref 34.8–46.6)
HEMOGLOBIN: 9.9 g/dL — AB (ref 11.6–15.9)
LYMPH#: 1.7 10*3/uL (ref 0.9–3.3)
LYMPH%: 19.2 % (ref 14.0–49.7)
MCH: 30.4 pg (ref 25.1–34.0)
MCHC: 34.2 g/dL (ref 31.5–36.0)
MCV: 88.9 fL (ref 79.5–101.0)
MONO#: 1.4 10*3/uL — ABNORMAL HIGH (ref 0.1–0.9)
MONO%: 15.4 % — ABNORMAL HIGH (ref 0.0–14.0)
NEUT%: 65.2 % (ref 38.4–76.8)
NEUTROS ABS: 5.7 10*3/uL (ref 1.5–6.5)
Platelets: 614 10*3/uL — ABNORMAL HIGH (ref 145–400)
RBC: 3.26 10*6/uL — AB (ref 3.70–5.45)
RDW: 12 % (ref 11.2–14.5)
WBC: 8.8 10*3/uL (ref 3.9–10.3)

## 2015-01-30 LAB — COMPREHENSIVE METABOLIC PANEL (CC13)
ALBUMIN: 3.7 g/dL (ref 3.5–5.0)
ALK PHOS: 96 U/L (ref 40–150)
ALT: 41 U/L (ref 0–55)
AST: 27 U/L (ref 5–34)
Anion Gap: 12 mEq/L — ABNORMAL HIGH (ref 3–11)
BUN: 25.7 mg/dL (ref 7.0–26.0)
CO2: 24 meq/L (ref 22–29)
CREATININE: 1.2 mg/dL — AB (ref 0.6–1.1)
Calcium: 10.2 mg/dL (ref 8.4–10.4)
Chloride: 106 mEq/L (ref 98–109)
EGFR: 47 mL/min/{1.73_m2} — ABNORMAL LOW (ref >90)
GLUCOSE: 124 mg/dL (ref 70–140)
Potassium: 3.5 mEq/L (ref 3.5–5.1)
SODIUM: 141 meq/L (ref 136–145)
TOTAL PROTEIN: 6.8 g/dL (ref 6.4–8.3)

## 2015-01-30 LAB — TECHNOLOGIST REVIEW

## 2015-01-30 MED ORDER — CARBOPLATIN CHEMO INJECTION 450 MG/45ML
390.5000 mg | Freq: Once | INTRAVENOUS | Status: AC
  Administered 2015-01-30: 390 mg via INTRAVENOUS
  Filled 2015-01-30: qty 39

## 2015-01-30 MED ORDER — SODIUM CHLORIDE 0.9 % IV SOLN
Freq: Once | INTRAVENOUS | Status: AC
  Administered 2015-01-30: 16:00:00 via INTRAVENOUS
  Filled 2015-01-30: qty 8

## 2015-01-30 MED ORDER — SODIUM CHLORIDE 0.9 % IV SOLN
Freq: Once | INTRAVENOUS | Status: AC
  Administered 2015-01-30: 16:00:00 via INTRAVENOUS

## 2015-01-30 MED ORDER — SODIUM CHLORIDE 0.9 % IV SOLN
400.0000 mg/m2 | Freq: Once | INTRAVENOUS | Status: AC
  Administered 2015-01-30: 750 mg via INTRAVENOUS
  Filled 2015-01-30: qty 26

## 2015-01-30 NOTE — Telephone Encounter (Signed)
lvm for pt regarding to 11.3 tx cx and moved to 11.10.Marland KitchenMarland KitchenMarland Kitchenpt will also get print out in chemo

## 2015-01-30 NOTE — Patient Instructions (Signed)
[place] Discharge Instructions for Patients Receiving Chemotherapy  Today you received the following chemotherapy agents;  Alimta, Carboplatin.    To help prevent nausea and vomiting after your treatment, we encourage you to take your nausea medication as directed.     If you develop nausea and vomiting that is not controlled by your nausea medication, call the clinic.   BELOW ARE SYMPTOMS THAT SHOULD BE REPORTED IMMEDIATELY:  *FEVER GREATER THAN 100.5 F  *CHILLS WITH OR WITHOUT FEVER  NAUSEA AND VOMITING THAT IS NOT CONTROLLED WITH YOUR NAUSEA MEDICATION  *UNUSUAL SHORTNESS OF BREATH  *UNUSUAL BRUISING OR BLEEDING  TENDERNESS IN MOUTH AND THROAT WITH OR WITHOUT PRESENCE OF ULCERS  *URINARY PROBLEMS  *BOWEL PROBLEMS  UNUSUAL RASH Items with * indicate a potential emergency and should be followed up as soon as possible.  Feel free to call the clinic you have any questions or concerns. The clinic phone number is [phone].  Please show the [place] at check-in to the Emergency Department and triage nurse.

## 2015-01-30 NOTE — Progress Notes (Signed)
Patient called me requesting samples of oral nutrition supplements. I have provided these to patient in the past. She is receiving chemotherapy today. Provided a variety of oral nutrition supplements along with coupons.  Patient was appreciative.

## 2015-02-05 ENCOUNTER — Telehealth: Payer: Self-pay | Admitting: *Deleted

## 2015-02-05 NOTE — Telephone Encounter (Signed)
Pt called to say she will not be able to come for lab appt tomorrow. Is feeling "weak and light headed". Wants to know if it is OK to take Vitamin B-12. Pt denies any fever.

## 2015-02-05 NOTE — Telephone Encounter (Signed)
OK to take oral B12

## 2015-02-06 ENCOUNTER — Other Ambulatory Visit: Payer: Medicare Other

## 2015-02-06 NOTE — Telephone Encounter (Addendum)
Attempt to reach pt, instructed per MD ok to take Oral B12.Pt thanked me for the call, no further conerns.

## 2015-02-11 ENCOUNTER — Telehealth: Payer: Self-pay | Admitting: *Deleted

## 2015-02-11 DIAGNOSIS — B370 Candidal stomatitis: Secondary | ICD-10-CM

## 2015-02-11 DIAGNOSIS — B373 Candidiasis of vulva and vagina: Secondary | ICD-10-CM

## 2015-02-11 DIAGNOSIS — B3731 Acute candidiasis of vulva and vagina: Secondary | ICD-10-CM

## 2015-02-11 MED ORDER — MAGIC MOUTHWASH: 5.0000 mL | Freq: Four times a day (QID) | ORAL | Status: DC

## 2015-02-11 MED ORDER — FLUCONAZOLE 100 MG PO TABS: 100.0000 mg | ORAL_TABLET | Freq: Every day | ORAL | Status: DC

## 2015-02-11 NOTE — Addendum Note (Signed)
Addended by: Ardeen Garland on: 02/11/2015 04:29 PM   Modules accepted: Orders

## 2015-02-11 NOTE — Telephone Encounter (Signed)
Patient called requesting medication for "yeast infection".  She reports she has it in her mouth and her bottom "is just raw."  She reports that she started developing this after her last chemotherapy on 01/30/15.  She would like this called into the [place] at [facility].  [phone] is patient's call back number.

## 2015-02-11 NOTE — Telephone Encounter (Signed)
Magic mouthwash called in and to pt .diflucan rx sent .

## 2015-02-12 ENCOUNTER — Other Ambulatory Visit: Payer: Self-pay | Admitting: *Deleted

## 2015-02-12 ENCOUNTER — Other Ambulatory Visit: Payer: Self-pay | Admitting: Internal Medicine

## 2015-02-12 ENCOUNTER — Other Ambulatory Visit: Payer: Self-pay | Admitting: Medical Oncology

## 2015-02-12 DIAGNOSIS — G44229 Chronic tension-type headache, not intractable: Secondary | ICD-10-CM

## 2015-02-12 DIAGNOSIS — C3490 Malignant neoplasm of unspecified part of unspecified bronchus or lung: Secondary | ICD-10-CM

## 2015-02-12 DIAGNOSIS — M5136 Other intervertebral disc degeneration, lumbar region: Secondary | ICD-10-CM

## 2015-02-12 DIAGNOSIS — G8929 Other chronic pain: Secondary | ICD-10-CM

## 2015-02-12 DIAGNOSIS — M25552 Pain in left hip: Secondary | ICD-10-CM

## 2015-02-12 MED ORDER — HYDROCODONE-ACETAMINOPHEN 10-325 MG PO TABS: ORAL_TABLET | ORAL | Status: DC

## 2015-02-12 NOTE — Telephone Encounter (Signed)
Patient requested and will pick up. Contract printed

## 2015-02-13 ENCOUNTER — Ambulatory Visit (HOSPITAL_BASED_OUTPATIENT_CLINIC_OR_DEPARTMENT_OTHER): Payer: Medicare Other

## 2015-02-13 ENCOUNTER — Other Ambulatory Visit (HOSPITAL_BASED_OUTPATIENT_CLINIC_OR_DEPARTMENT_OTHER): Payer: Medicare Other

## 2015-02-13 ENCOUNTER — Telehealth: Payer: Self-pay | Admitting: Medical Oncology

## 2015-02-13 ENCOUNTER — Ambulatory Visit: Payer: Medicare Other | Admitting: Internal Medicine

## 2015-02-13 ENCOUNTER — Other Ambulatory Visit: Payer: Self-pay | Admitting: Medical Oncology

## 2015-02-13 ENCOUNTER — Telehealth: Payer: Self-pay | Admitting: Internal Medicine

## 2015-02-13 ENCOUNTER — Ambulatory Visit: Payer: Medicare Other

## 2015-02-13 DIAGNOSIS — D702 Other drug-induced agranulocytosis: Secondary | ICD-10-CM

## 2015-02-13 DIAGNOSIS — C3490 Malignant neoplasm of unspecified part of unspecified bronchus or lung: Secondary | ICD-10-CM | POA: Diagnosis present

## 2015-02-13 DIAGNOSIS — C3491 Malignant neoplasm of unspecified part of right bronchus or lung: Secondary | ICD-10-CM | POA: Diagnosis not present

## 2015-02-13 LAB — COMPREHENSIVE METABOLIC PANEL (CC13)
ALBUMIN: 3.5 g/dL (ref 3.5–5.0)
ALT: 86 U/L — ABNORMAL HIGH (ref 0–55)
ANION GAP: 13 meq/L — AB (ref 3–11)
AST: 68 U/L — AB (ref 5–34)
Alkaline Phosphatase: 89 U/L (ref 40–150)
BILIRUBIN TOTAL: 0.42 mg/dL (ref 0.20–1.20)
BUN: 17.9 mg/dL (ref 7.0–26.0)
CO2: 25 mEq/L (ref 22–29)
Calcium: 9.7 mg/dL (ref 8.4–10.4)
Chloride: 101 mEq/L (ref 98–109)
Creatinine: 1.4 mg/dL — ABNORMAL HIGH (ref 0.6–1.1)
EGFR: 39 mL/min/{1.73_m2} — AB (ref >90)
Glucose: 125 mg/dl (ref 70–140)
POTASSIUM: 3.6 meq/L (ref 3.5–5.1)
SODIUM: 139 meq/L (ref 136–145)
TOTAL PROTEIN: 6.4 g/dL (ref 6.4–8.3)

## 2015-02-13 LAB — CBC & DIFF AND RETIC
BASO%: 0 % (ref 0.0–2.0)
Basophils Absolute: 0 10*3/uL (ref 0.0–0.1)
EOS%: 0.8 % (ref 0.0–7.0)
Eosinophils Absolute: 0 10*3/uL (ref 0.0–0.5)
HCT: 27.5 % — ABNORMAL LOW (ref 34.8–46.6)
HGB: 9.2 g/dL — ABNORMAL LOW (ref 11.6–15.9)
IMMATURE RETIC FRACT: 20.4 % — AB (ref 1.60–10.00)
LYMPH#: 2.7 10*3/uL (ref 0.9–3.3)
LYMPH%: 72 % — ABNORMAL HIGH (ref 14.0–49.7)
MCH: 30.2 pg (ref 25.1–34.0)
MCHC: 33.5 g/dL (ref 31.5–36.0)
MCV: 90.2 fL (ref 79.5–101.0)
MONO#: 0.5 10*3/uL (ref 0.1–0.9)
MONO%: 12.7 % (ref 0.0–14.0)
NEUT%: 14.5 % — ABNORMAL LOW (ref 38.4–76.8)
NEUTROS ABS: 0.5 10*3/uL — AB (ref 1.5–6.5)
PLATELETS: 74 10*3/uL — AB (ref 145–400)
RBC: 3.05 10*6/uL — AB (ref 3.70–5.45)
RDW: 12.7 % (ref 11.2–14.5)
RETIC %: 0.51 % — AB (ref 0.70–2.10)
RETIC CT ABS: 15.56 10*3/uL — AB (ref 33.70–90.70)
WBC: 3.7 10*3/uL — AB (ref 3.9–10.3)

## 2015-02-13 MED ORDER — TBO-FILGRASTIM 300 MCG/0.5ML ~~LOC~~ SOSY
300.0000 ug | PREFILLED_SYRINGE | Freq: Once | SUBCUTANEOUS | Status: DC
  Administered 2015-02-13: 300 ug via SUBCUTANEOUS
  Filled 2015-02-13: qty 0.5

## 2015-02-13 NOTE — Telephone Encounter (Signed)
Gave and printed appt sched and avs fo rpt; for NOV  °

## 2015-02-13 NOTE — Telephone Encounter (Addendum)
Asking to postpone treatment from 11/10 to dec 1st because she is moving and will need time to move in. Note to [place]. Pt given rx for wig today.

## 2015-02-13 NOTE — Patient Instructions (Signed)
Food Safety for the Immunocompromised Person If you are immunocompromised, it is important to follow food safety guidelines. Bacteria and other harmful germs are more likely to be in raw or fresh foods. Thoroughly cooking foods destroys these germs. Fresh vegetables should be cooked until tender; meats should be cooked until well-done; and eggs should be cooked until the yolks are firm. Dairy products, juices, and ciders should have the word "pasteurized" on the label. The following information can help you choose the right foods and prepare them correctly in order to keep you healthy. WHAT DO I NEED TO KNOW ABOUT FOOD SAFETY?  Wash your hands with soap and water before and after preparing food. Always wash your hands after touching raw meat.  Wash any surfaces that you will be using to prepare food. Use hot, soapy water.  Keep foods separate when you are preparing and cooking a meal. Do not use the same knife or cutting board to cut your fresh produce and raw meat.  Cook food to the right temperature:  Beef, pork, veal, lamb, and steak should be cooked to 145F (63C) with a 3-minute rest time.  Fish should be cooked to 145F.  Ground beef, pork, veal, and lamb should be cooked to 160F (71C).  Egg dishes should be cooked to 160F.  Poultry (whole, pieces, and ground) should be cooked to 165F (74C).  Put any leftovers in the refrigerator as soon as possible to stop bacteria from growing and to keep your food from going bad.  Hot foods should be kept at 169F (60C) or more, and cold foods should be kept at 69F (4.4C) or less. PREPARATION GUIDELINES Cooking and Eating Utensil Preparation  Wash the following with soap and hot water before and after use:  Countertops.  Contractor.  Cooking utensils.  Silverware.  Flatware.  Pots and pans.  Dishes.  Glassware.  Air dry all cooking and eating utensils. Do not dry them with a cloth towel. Food Preparation  Do not  buy food that has passed the expiration or "use by" date.  Wash your hands often for at least 20 seconds with warm, soapy water and dry them with paper towels. This is especially important after you have touched raw meat, eggs, or fish.  Wash fruits and vegetables thoroughly under cold running water before peeling or cutting them. Individually scrub produce that has a thick, rough skin or rind, such as cabbage. Do not use commercial rinses to wash fruits and vegetables.  Rinse packaged salads, slaw mix, and other prepared produce under cold running water. Do this even if the food is labeled "prewashed."  Thaw frozen foods in the refrigerator overnight or quickly in the microwave. Do not thaw food on countertops.  Do not touch or use raw yeast. There is a risk that you could breathe it in. Raw yeast is used to make bread.  Cook all perishable foods thoroughly.  Do not leave easily spoiled items at room temperature for more than 10-15 minutes.  Refrigerate leftovers as soon as possible in small, airtight, shallow containers.  Eat leftovers only if they have been stored properly. Do not eat leftovers that have been around for longer than 24 hours.  Boil marinades before using them on raw foods.  Clean the outside of your canned goods before opening them.  Do not put cooked food on a surface that you had placed raw meat, fish, or eggs on. Those surfaces must be washed with warm, soapy water before you use  them again.  Always place raw meat, seafood, and eggs in plastic bags before putting them in your shopping cart at the grocery story. Also, bag those items separately and not with other food you have purchased. Once home, place them in your refrigerator right away. WHAT FOODS CAN I NOT EAT? Grains  Fresh bakery breads, muffins, cakes, donuts, and cream- or custard-filled cakes.  Raw or uncooked grain products.  Beer that has "unpasteurized" on the label, is made with uncooked brewer's  yeast, is homemade or home brewed, or is from a microbrewery. Vegetables  Unwashedraw vegetables and salads.  Unpasteurized vegetable juice.  Raw vegetable sprouts, such as alfalfa, radish, broccoli, and mung bean.  Salads from the deli or salad bar. Fruits  Unwashed raw fruit.  Unpasteurized fruit juices.  Fresh apple cider. Meat and Other Protein Sources  All raw, uncooked, undercooked, or rare meat, fish, eggs, poultry, or tofu. This includes:  Sushi.  Partially cooked seafood, such as shrimp and crab.  Raw shellfish, such as oysters, clams, mussels, and scallops and their juices.  Refrigerated smoked seafood, including smoked salmon and lox.  Unpasteurized, refrigerated pates or meat spreads.  Unheated cold cuts from the deli, including hot dogs, dry or fermented sausage, or other deli meat. These are okay if you heat them until they are steaming or reach 165F.  Hard cured salami in natural wrap.  Any meat, poultry, or seafood salad made at the grocery store or at [employer].  Pickled fish.  Any fermented foods such as tempeh or miso products.  Unprocessed nuts, unroasted raw nuts, and roasted nuts in the shell.  Food products made with raw or undercooked eggs, such as Caesar salad dressing, mayonnaise, homemade cookie dough, cake batters, and eggnog. While most products at the grocery store are made with pasteurized eggs, you still need to read the label to make sure. Do not eat anything that has the word "unpasteurized" on the label. Dairy  Soft cheeses made from unpasteurized milk or molds (such as feta, Brie, Camembert, and Gorgonzola), blue-veined cheese (such as Stilton and Roquefort), Mexican-style cheeses (such as [place]), and farmer's cheese.  Unpasteurized or raw milk cheese, yogurt, and other milk products.  Cheeses containing chili peppers or other uncooked vegetables.  Any imported cheeses.  Any cheese sliced at a  deli. Beverages  Unboiled well water.  Cold-brewed tea or "sun teas" made with warm or cold water.  Mate tea or yerba mate tea.  Raw, unpasteurized milk.  Eggnog or milkshakes made with raw eggs.  Unpasteurized fruit and vegetable juices.  Fresh apple cider.  Wine or beer that is unpasteurized, homemade or home brewed, or from a microbrewery. Condiments  Uncooked herbs or spices.  Raw or unpasteurized honey.  Prepackaged salsas stored in a refrigerated case. Sweets/Desserts  Unrefrigerated custard or cream-filled pastry products.  Soft-serve ice cream or frozen yogurt.  Hand-packed ice cream or frozen yogurt. Fats  Fresh salad dressings containing raw eggs or aged cheese (such as blue cheese and Roquefort) stored in a refrigerated case.   This information is not intended to replace advice given to you by your health care provider. Make sure you discuss any questions you have with your health care provider.   Document Released: 01/24/2007 Document Revised: 04/19/2014 Document Reviewed: 08/28/2013 Elsevier Interactive Patient Education 2016 Elsevier Inc. Tbo-Filgrastim injection What is this medicine? TBO-FILGRASTIM (T B O fil GRA stim) is a granulocyte colony-stimulating factor that stimulates the growth of neutrophils, a type of white  blood cell important in the body's fight against infection. It is used to reduce the incidence of fever and infection in patients with certain types of cancer who are receiving chemotherapy that affects the bone marrow. This medicine may be used for other purposes; ask your health care provider or pharmacist if you have questions. What should I tell my health care provider before I take this medicine? They need to know if you have any of these conditions: -ongoing radiation therapy -sickle cell anemia -an unusual or allergic reaction to tbo-filgrastim, filgrastim, pegfilgrastim, other medicines, foods, dyes, or preservatives -pregnant  or trying to get pregnant -breast-feeding How should I use this medicine? This medicine is for injection under the skin. If you get this medicine at home, you will be taught how to prepare and give this medicine. Refer to the Instructions for Use that come with your medication packaging. Use exactly as directed. Take your medicine at regular intervals. Do not take your medicine more often than directed. It is important that you put your used needles and syringes in a special sharps container. Do not put them in a trash can. If you do not have a sharps container, call your pharmacist or healthcare provider to get one. Talk to your pediatrician regarding the use of this medicine in children. Special care may be needed. Overdosage: If you think you have taken too much of this medicine contact a poison control center or emergency room at once. NOTE: This medicine is only for you. Do not share this medicine with others. What if I miss a dose? It is important not to miss your dose. Call your doctor or health care professional if you miss a dose. What may interact with this medicine? This medicine may interact with the following medications: -medicines that may cause a release of neutrophils, such as lithium This list may not describe all possible interactions. Give your health care provider a list of all the medicines, herbs, non-prescription drugs, or dietary supplements you use. Also tell them if you smoke, drink alcohol, or use illegal drugs. Some items may interact with your medicine. What should I watch for while using this medicine? You may need blood work done while you are taking this medicine. What side effects may I notice from receiving this medicine? Side effects that you should report to your doctor or health care professional as soon as possible: -allergic reactions like skin rash, itching or hives, swelling of the face, lips, or tongue -shortness of breath or breathing  problems -fever -pain, redness, or irritation at site where injected -pinpoint red spots on the skin -stomach or side pain, or pain at the shoulder -swelling -tiredness -trouble passing urine Side effects that usually do not require medical attention (Report these to your doctor or health care professional if they continue or are bothersome.): -bone pain -muscle pain This list may not describe all possible side effects. Call your doctor for medical advice about side effects. You may report side effects to FDA at 1-800-FDA-1088. Where should I keep my medicine? Keep out of the reach of children. Store in a refrigerator between 2 and 8 degrees C (36 and 46 degrees F). Keep in carton to protect from light. Throw away this medicine if it is left out of the refrigerator for more than 5 consecutive days. Throw away any unused medicine after the expiration date. NOTE: This sheet is a summary. It may not cover all possible information. If you have questions about this medicine, talk to  your doctor, pharmacist, or health care provider.    2016, Elsevier/Gold Standard. (2013-07-19 11:52:29)

## 2015-02-14 ENCOUNTER — Ambulatory Visit (HOSPITAL_BASED_OUTPATIENT_CLINIC_OR_DEPARTMENT_OTHER): Payer: Medicare Other

## 2015-02-14 VITALS — BP 129/58 | HR 101 | Temp 99.0°F

## 2015-02-14 DIAGNOSIS — D702 Other drug-induced agranulocytosis: Secondary | ICD-10-CM | POA: Diagnosis present

## 2015-02-14 DIAGNOSIS — C3491 Malignant neoplasm of unspecified part of right bronchus or lung: Secondary | ICD-10-CM

## 2015-02-14 MED ORDER — TBO-FILGRASTIM 300 MCG/0.5ML ~~LOC~~ SOSY
300.0000 ug | PREFILLED_SYRINGE | Freq: Once | SUBCUTANEOUS | Status: AC
  Administered 2015-02-14: 300 ug via SUBCUTANEOUS
  Filled 2015-02-14: qty 0.5

## 2015-02-17 ENCOUNTER — Telehealth: Payer: Self-pay | Admitting: Internal Medicine

## 2015-02-17 ENCOUNTER — Telehealth: Payer: Self-pay | Admitting: *Deleted

## 2015-02-17 NOTE — Telephone Encounter (Signed)
Per pof cx 11.9.Marland Kitchen..done

## 2015-02-17 NOTE — Telephone Encounter (Signed)
-----   Message from Ardeen Garland, RN sent at 02/17/2015  8:34 AM EST ----- Regarding: FW: delay treatment   ----- Message -----    From: Curt Bears, MD    Sent: 02/13/2015   5:32 PM      To: Ardeen Garland, RN Subject: RE: delay treatment                            Okay she can call us after she finished moving. ----- Message -----    From: Ardeen Garland, RN    Sent: 02/13/2015   1:07 PM      To: Curt Bears, MD Subject: delay treatment                                Asking to postpone treatment from 11/10 to dec 1st because she is moving and will need time to move in.

## 2015-02-17 NOTE — Telephone Encounter (Signed)
Notified pt ok to postpone treatment. POF to scheduling.

## 2015-02-19 ENCOUNTER — Other Ambulatory Visit: Payer: Self-pay | Admitting: Internal Medicine

## 2015-02-19 ENCOUNTER — Ambulatory Visit: Payer: Medicare Other | Admitting: Nurse Practitioner

## 2015-02-19 ENCOUNTER — Telehealth: Payer: Self-pay | Admitting: Internal Medicine

## 2015-02-19 ENCOUNTER — Telehealth: Payer: Self-pay

## 2015-02-19 ENCOUNTER — Other Ambulatory Visit: Payer: Medicare Other

## 2015-02-19 DIAGNOSIS — C3491 Malignant neoplasm of unspecified part of right bronchus or lung: Secondary | ICD-10-CM

## 2015-02-19 NOTE — Telephone Encounter (Signed)
Patient called requesting a status update on paperwork sent to Dr.Green for approval of dog due to current medical conditions. Letter that Dr.Green composed was received, the apartment complex sent an additional form to be completed. Patient aware I will follow-up with Rodena Piety [facility] assistant) for I heard someone speaking about this paperwork yesterday.  Paper was placed in Dr.Green's folder, I looked in his folder as well as all the other providers and was unable to locate paperwork. I requested that patient have paperwork re-submitted for completion.

## 2015-02-19 NOTE — Telephone Encounter (Signed)
Ms. Ditullio dropped off a reasonable accommodation form to be filled out by Dr. Nyoka Cowden. The form was placed in the rx tray.

## 2015-02-19 NOTE — Telephone Encounter (Signed)
Paperwork received I will give to [name], NP to see if she will complete paperwork for patient tomorrow. Dr.Green is out of office and patient needs this paperwork this week.   Please advise if appointment will be necessary for we have a 8:30 and 4:15 for tomorrow. Dr.Green already submitted a letter (see letters tab)   Left message on voicemail for patient to return call when available , reason for call was to go over form (review questions). Form is now in [employer]

## 2015-02-20 ENCOUNTER — Ambulatory Visit: Payer: Medicare Other

## 2015-02-20 ENCOUNTER — Other Ambulatory Visit: Payer: Medicare Other

## 2015-02-20 ENCOUNTER — Telehealth: Payer: Self-pay | Admitting: Internal Medicine

## 2015-02-20 ENCOUNTER — Ambulatory Visit: Payer: Medicare Other | Admitting: Nurse Practitioner

## 2015-02-20 ENCOUNTER — Other Ambulatory Visit: Payer: Self-pay | Admitting: *Deleted

## 2015-02-20 NOTE — Telephone Encounter (Signed)
per pof sent CX all pt appts except 12/1-confirmed w/Mary-pt moving

## 2015-02-26 NOTE — Telephone Encounter (Signed)
Called pt regarding letter of accommodation for her dog.  The form has been completed and is up front, ready for pick up. I have tried called Mrs. Meanor several time.  Not able to leave a message. The form is located in the file folder up front..Carolin Coy

## 2015-02-27 ENCOUNTER — Other Ambulatory Visit: Payer: Medicare Other

## 2015-03-05 ENCOUNTER — Other Ambulatory Visit: Payer: Medicare Other

## 2015-03-05 ENCOUNTER — Ambulatory Visit: Payer: Medicare Other

## 2015-03-05 ENCOUNTER — Ambulatory Visit: Payer: Medicare Other | Admitting: Internal Medicine

## 2015-03-07 ENCOUNTER — Telehealth: Payer: Self-pay | Admitting: Internal Medicine

## 2015-03-07 ENCOUNTER — Other Ambulatory Visit: Payer: Self-pay | Admitting: Internal Medicine

## 2015-03-07 NOTE — Telephone Encounter (Signed)
Per MM moved 12/1 to AM due to meeting. Spoke with patient re change and new time for lab/fu/tx at [facility] from LT to 9:30 am.

## 2015-03-10 ENCOUNTER — Telehealth: Payer: Self-pay | Admitting: *Deleted
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
## 2015-03-12 ENCOUNTER — Other Ambulatory Visit: Payer: Self-pay | Admitting: Internal Medicine

## 2015-03-12 ENCOUNTER — Telehealth: Payer: Self-pay | Admitting: Internal Medicine

## 2015-03-12 ENCOUNTER — Other Ambulatory Visit: Payer: Self-pay | Admitting: *Deleted

## 2015-03-12 NOTE — Telephone Encounter (Signed)
Per 11/28 pof r/s 12/1 lab/fu and no more chemo til patient sees MM. Spoke with patient re appointments for 12/15 @ 10:15 am ( 1st available).

## 2015-03-13 ENCOUNTER — Ambulatory Visit: Payer: Medicare Other | Admitting: Internal Medicine

## 2015-03-13 ENCOUNTER — Other Ambulatory Visit: Payer: Medicare Other

## 2015-03-13 ENCOUNTER — Ambulatory Visit: Payer: Medicare Other

## 2015-03-14 ENCOUNTER — Other Ambulatory Visit: Payer: Self-pay | Admitting: Internal Medicine

## 2015-03-14 ENCOUNTER — Other Ambulatory Visit: Payer: Self-pay | Admitting: *Deleted

## 2015-03-14 DIAGNOSIS — M25552 Pain in left hip: Secondary | ICD-10-CM

## 2015-03-14 DIAGNOSIS — G44229 Chronic tension-type headache, not intractable: Secondary | ICD-10-CM

## 2015-03-14 DIAGNOSIS — G8929 Other chronic pain: Secondary | ICD-10-CM

## 2015-03-14 DIAGNOSIS — M5136 Other intervertebral disc degeneration, lumbar region: Secondary | ICD-10-CM

## 2015-03-14 MED ORDER — HYDROCODONE-ACETAMINOPHEN 10-325 MG PO TABS: ORAL_TABLET | ORAL | Status: DC

## 2015-03-14 NOTE — Telephone Encounter (Signed)
Patient requested and will pick up 

## 2015-03-17 ENCOUNTER — Other Ambulatory Visit: Payer: Self-pay | Admitting: *Deleted

## 2015-03-17 MED ORDER — ALPRAZOLAM 1 MG PO TABS: ORAL_TABLET | ORAL | Status: DC

## 2015-03-17 NOTE — Telephone Encounter (Signed)
Patient requested Rx to be faxed to [facility]. They did not receive previous refill request faxed. Refaxed.

## 2015-03-18 ENCOUNTER — Telehealth: Payer: Self-pay | Admitting: Internal Medicine

## 2015-03-18 NOTE — Telephone Encounter (Signed)
returned call and s.w. pt and confirmed appt....pt ok and aware °

## 2015-03-27 ENCOUNTER — Encounter: Payer: Self-pay | Admitting: Internal Medicine

## 2015-03-27 ENCOUNTER — Ambulatory Visit (HOSPITAL_BASED_OUTPATIENT_CLINIC_OR_DEPARTMENT_OTHER): Payer: Medicare Other | Admitting: Internal Medicine

## 2015-03-27 ENCOUNTER — Ambulatory Visit: Payer: Medicare Other

## 2015-03-27 ENCOUNTER — Telehealth: Payer: Self-pay | Admitting: Internal Medicine

## 2015-03-27 ENCOUNTER — Other Ambulatory Visit (HOSPITAL_BASED_OUTPATIENT_CLINIC_OR_DEPARTMENT_OTHER): Payer: Medicare Other

## 2015-03-27 VITALS — BP 141/54 | HR 95 | Temp 98.5°F | Resp 19 | Ht 66.0 in | Wt 167.0 lb

## 2015-03-27 DIAGNOSIS — C3411 Malignant neoplasm of upper lobe, right bronchus or lung: Secondary | ICD-10-CM

## 2015-03-27 DIAGNOSIS — J438 Other emphysema: Secondary | ICD-10-CM

## 2015-03-27 DIAGNOSIS — C3491 Malignant neoplasm of unspecified part of right bronchus or lung: Secondary | ICD-10-CM

## 2015-03-27 DIAGNOSIS — R0602 Shortness of breath: Secondary | ICD-10-CM

## 2015-03-27 LAB — COMPREHENSIVE METABOLIC PANEL
ALBUMIN: 3.9 g/dL (ref 3.5–5.0)
ALK PHOS: 104 U/L (ref 40–150)
ALT: 20 U/L (ref 0–55)
AST: 29 U/L (ref 5–34)
Anion Gap: 14 mEq/L — ABNORMAL HIGH (ref 3–11)
BILIRUBIN TOTAL: 0.38 mg/dL (ref 0.20–1.20)
BUN: 11.6 mg/dL (ref 7.0–26.0)
CO2: 20 meq/L — AB (ref 22–29)
CREATININE: 1.4 mg/dL — AB (ref 0.6–1.1)
Calcium: 10.1 mg/dL (ref 8.4–10.4)
Chloride: 104 mEq/L (ref 98–109)
EGFR: 37 mL/min/{1.73_m2} — AB (ref >90)
GLUCOSE: 165 mg/dL — AB (ref 70–140)
Potassium: 4.1 mEq/L (ref 3.5–5.1)
SODIUM: 138 meq/L (ref 136–145)
TOTAL PROTEIN: 7.4 g/dL (ref 6.4–8.3)

## 2015-03-27 LAB — CBC WITH DIFFERENTIAL/PLATELET
BASO%: 0 % (ref 0.0–2.0)
BASOS ABS: 0 10*3/uL (ref 0.0–0.1)
EOS ABS: 0 10*3/uL (ref 0.0–0.5)
EOS%: 0 % (ref 0.0–7.0)
HCT: 32.2 % — ABNORMAL LOW (ref 34.8–46.6)
HEMOGLOBIN: 10.6 g/dL — AB (ref 11.6–15.9)
LYMPH%: 13.6 % — ABNORMAL LOW (ref 14.0–49.7)
MCH: 33.4 pg (ref 25.1–34.0)
MCHC: 32.9 g/dL (ref 31.5–36.0)
MCV: 101.6 fL — AB (ref 79.5–101.0)
MONO#: 0.2 10*3/uL (ref 0.1–0.9)
MONO%: 2.1 % (ref 0.0–14.0)
NEUT%: 84.3 % — ABNORMAL HIGH (ref 38.4–76.8)
NEUTROS ABS: 7.3 10*3/uL — AB (ref 1.5–6.5)
Platelets: 274 10*3/uL (ref 145–400)
RBC: 3.17 10*6/uL — AB (ref 3.70–5.45)
RDW: 15.5 % — AB (ref 11.2–14.5)
WBC: 8.7 10*3/uL (ref 3.9–10.3)
lymph#: 1.2 10*3/uL (ref 0.9–3.3)

## 2015-03-27 NOTE — Telephone Encounter (Signed)
Gave and printed appt sched anda vs for pt for DEC adn Jan

## 2015-03-27 NOTE — Progress Notes (Signed)
[name] [phone]   [phone]  OFFICE PROGRESS NOTE  GREEN, Kathleen Spare, MD [address] [place] [address]  DIAGNOSIS: Progressive non-small cell lung cancer initially diagnosed as Stage IIB/IIIA non-small cell lung cancer consistent with adenocarcinoma with negative EGFR mutation and negative ALK gene translocation diagnosed in December 2012.   PRIOR THERAPY:  1) Status post radiation therapy to the right upper lobe lung mass completed on 06/04/2011 under the care of Dr. Pablo Ledger.  2) Systemic chemotherapy with carboplatin for AUC of 5 and Alimta 500 mg/M2 giving every 3 weeks, status post 3 cycles, last dose was given on 07/21/2011.   CURRENT THERAPY: Systemic chemotherapy with carboplatin for AUC of 5 and Alimta 500 MG/M2 every 3 weeks. First dose on 01/02/2015. Status post 2 cycles.  INTERVAL HISTORY: [name] 69 y.o. female returns to the clinic today for routine four month follow up visit accompanied by her son. The patient has been off treatment for around 6 weeks now. She has rough time with her systemic chemotherapy with increasing fatigue and weakness as well as pancytopenia. She is feeling a little bit better today with no specific complaints except for the baseline shortness of breath. She is currently on home oxygen 2 L/minute. . She denied having any significant weight loss or night sweats. She has no nausea or vomiting, no fever or chills. She is here today for reevaluation and consideration of resuming her treatment.  MEDICAL HISTORY: Past Medical History  Diagnosis Date  . 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 Restless legs syndrome (RLS)   . Chronic sinusitis   . Hypertension   . Hyperlipidemia   . Anemia, unspecified   . GERD (gastroesophageal reflux disease)   . Respiratory failure with hypoxia 01/02/2010  . Complication of anesthesia     slow to wake up  . Asthma   . Coronary artery disease     Dr.  Burt Knack had stress test done  . Type II or unspecified type diabetes mellitus without mention of complication, not stated as uncontrolled   . Peripheral vascular disease ([place])   . PAD (peripheral artery disease) ([place])   . Heart murmur   . Dysrhythmia     irregular  . Anginal pain ([place])   . History of blood clots     "2 in my aorta"  . Hard of hearing, right   . History of bronchitis   . Bronchial pneumonia     history  . COPD (chronic obstructive pulmonary disease) ([place])   . Emphysema   . Shortness of breath     "anytime really"  . Syncope and collapse   . Adenocarcinoma, lung ([place])     upper left lobe  . Non-small cell lung cancer ([place]) 03/31/2011    Adenocarcinoma Rt upper lobe mass  . Chemotherapy adverse reaction 09/15/11    "makes me light headed and pass out; this is 3rd blood transfusion since going thru it"  . History of blood transfusion   . Kidney infection     "related to chemo"  . Sinus headache     "all the time"  . Arthritis     "all over my body"  . Fibromyalgia   . Osteoporosis   . DDD (degenerative disc disease), lumbar   . Scoliosis   . Anxiety   . Mental disorder   . Reflux   . CHF (congestive heart failure) ([place])   . DVT (deep venous  thrombosis) ([place])   . Hx of radiation therapy 04/28/11 -06/08/11    right upper lobe-lung  . Acute upper respiratory infections of unspecified site   . Pain in thoracic spine   . Asymptomatic varicose veins   . Functional urinary incontinence   . Diseases of lips   . Other abnormal blood chemistry   . Effects of radiation, unspecified   . Anemia in neoplastic disease   . Muscle weakness (generalized)   . Dizziness and giddiness   . Spasm of muscle   . Unspecified vitamin D deficiency   . Hyperpotassemia   . Acute posthemorrhagic anemia   . Unspecified hearing loss   . Disorder of bone and cartilage, unspecified   . Screening for thyroid disorder   . Malignant neoplasm of bronchus and lung, unspecified site   .  Hypopotassemia   . Atherosclerosis of native arteries of the extremities, unspecified   . Reflux esophagitis   . Unspecified constipation   . Type II or unspecified type diabetes mellitus with neurological manifestations, uncontrolled   . Unspecified hereditary and idiopathic peripheral neuropathy   . Urinary tract infection, site not specified   . Muscle weakness (generalized)   . Acute sinusitis, unspecified   . Herpes zoster without mention of complication   . 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 Type II or unspecified type diabetes mellitus without mention of complication, not stated as uncontrolled   . Other and unspecified hyperlipidemia   . Anxiety state, unspecified   . Depressive disorder, not elsewhere classified   . Unspecified essential hypertension   . Atrial fibrillation ([place])   . Chronic airway obstruction, not elsewhere classified   . Osteoarthrosis, unspecified whether generalized or localized, unspecified site   . Lumbago   . Hip pain, chronic 09/17/2014    ALLERGIES:  is allergic to iohexol; other; anoro ellipta; codeine; and protonix.  MEDICATIONS:  Current Outpatient Prescriptions  Medication Sig Dispense Refill  . albuterol-ipratropium (COMBIVENT) 18-103 MCG/ACT inhaler Inhale 2 puffs into the lungs every 4 (four) hours as needed (lung cancer).     . ALPRAZolam (XANAX) 1 MG tablet Take one tablet by mouth three times daily as needed for anxiety 90 tablet 0  . AMBULATORY NON FORMULARY MEDICATION Lighter Weight Rollator with Padded Seat and Hand Brakes Dx: 496, 729.1, 722.52 1 each 0  . AMBULATORY NON FORMULARY MEDICATION Raised Commode Seat with Rail Dx: M51.36, M54.5 1 each 0  . amoxicillin-clavulanate (AUGMENTIN) 875-125 MG per tablet Take 1 tablet by mouth 2 (two) times daily. 20 tablet 0  . aspirin 81 MG tablet Take 81 mg by mouth daily.    . Calcium Citrate-Vitamin D (CITRACAL + D PO) Take 2 tablets by mouth daily.    . cetirizine-pseudoephedrine  (ZYRTEC-D) 5-120 MG per tablet Take 1 tablet by mouth 2 (two) times daily. To help allergy and congestion    . citalopram (CELEXA) 20 MG tablet TAKE 1 TABLET BY MOUTH EVERY DAY TO HELP MOOD 90 tablet 0  . COMBIVENT RESPIMAT 20-100 MCG/ACT AERS respimat Inhale 1 puff into the lungs every 6 (six) hours as needed for wheezing.     . COMBIVENT RESPIMAT 20-100 MCG/ACT AERS respimat INHALE 1 PUFF BY MOUTH EVERY 6 HOURS AS NEEDED FOR SHORTNESS OF BREATH 12 Inhaler 3  . dexamethasone (DECADRON) 4 MG tablet 4 mg by mouth twice a day the day before, day of and day after the chemotherapy every 3 weeks. 40 tablet 1  .  diltiazem (CARDIZEM CD) 240 MG 24 hr capsule TAKE 1 CAPSULE BY MOUTH EVERY DAY FOR BLOOD PRESSURE 30 capsule 2  . Ferrous Fumarate-Folic Acid (HEMATINIC/FOLIC ACID) 951-8 MG TABS Take 1 tablet by mouth daily. 30 each 0  . fluconazole (DIFLUCAN) 100 MG tablet Take 1 tablet (100 mg total) by mouth daily. 7 tablet 0  . fluticasone (FLONASE) 50 MCG/ACT nasal spray INSTILL 2 SPRAYS INTO BOTH NOSTRILS  TWICE DAILY 16 g 2  . folic acid (FOLVITE) 1 MG tablet Take 1 tablet (1 mg total) by mouth daily. 30 tablet 4  . gemfibrozil (LOPID) 600 MG tablet TAKE 1 TABLET BY MOUTH ONCE DAILY 30 tablet 4  . glucose blood (FREESTYLE LITE) test strip Use to test blood sugar three times daily. Dx: E11.9, E11.49 100 each 12  . glucose blood test strip Check blood sugar once daily  DX: 250.00    . HYDROcodone-acetaminophen (NORCO) 10-325 MG tablet Take One tablet by mouth every 6 hours if needed for pain 120 tablet 0  . Lancets (FREESTYLE) lancets Use to test blood sugar three times daily. Dx: E11.9, E11.49 100 each 12  . loratadine (CLARITIN) 10 MG tablet TAKE 1 TABLET BY MOUTH EVERY DAY 30 tablet 5  . losartan (COZAAR) 50 MG tablet TAKE 1 TABLET BY MOUTH TWICE DAILY 60 tablet 3  . magic mouthwash SOLN Take 5 mLs by mouth 4 (four) times daily. 240 mL 0  . meloxicam (MOBIC) 15 MG tablet One daily to help arthritis  (Patient taking differently: Take 15 mg by mouth daily. One daily to help arthritis) 30 tablet 5  . metFORMIN (GLUCOPHAGE) 500 MG tablet TAKE 1 TABLET BY MOUTH TWICE DAILY TO CONTROL BLOOD SUGAR 60 tablet 2  . metoCLOPramide (REGLAN) 5 MG tablet TAKE 1 TABLET BY MOUTH 4 TIMES A DAY 120 tablet 2  . Multiple Vitamins-Minerals (CENTRUM SILVER PO) Take 1 tablet by mouth daily. Patient unsure of dose    . NON FORMULARY Place 3 L into the nose daily. Oxygen    . potassium chloride (K-DUR,KLOR-CON) 10 MEQ tablet TAKE 1 TABLET BY MOUTH EVERY DAY 30 tablet 5  . predniSONE (DELTASONE) 50 MG tablet Take one tablet 13 hours , one tablet  7 hours and one tablet 1 hour prior to CT scan 3 tablet 1  . prochlorperazine (COMPAZINE) 10 MG tablet Take 1 tablet (10 mg total) by mouth every 6 (six) hours as needed for nausea or vomiting. 30 tablet 1  . ranitidine (ZANTAC) 300 MG tablet TAKE 1 TABLET BY MOUTH EVERY NIGHT AT BEDTIME 30 tablet 4  . traMADol (ULTRAM) 50 MG tablet TAKE 1 TABLET BY MOUTH THREE TIMES A DAY AS NEEDED FOR PAIN 90 tablet 0  . triamterene-hydrochlorothiazide (MAXZIDE-25) 37.5-25 MG per tablet TAKE 1 TABLET BY MOUTH EVERY DAY TO CONTROL BLOOD PRESSURE 90 tablet 0  . [DISCONTINUED] calcium carbonate 200 MG capsule Take 250 mg by mouth daily. Does not take everyday     No current facility-administered medications for this visit.   Facility-Administered Medications Ordered in Other Visits  Medication Dose Route Frequency Provider Last Rate Last Dose  . hyaluronate sodium (RADIAPLEXRX) gel   Topical Once Thea Silversmith, MD      . topical emolient (BIAFINE) emulsion   Topical PRN Thea Silversmith, MD        SURGICAL HISTORY:  Past Surgical History  Procedure Laterality Date  . Lung lobectomy  1986    left  . Septoplasty    .  Total hip arthroplasty  01/2010    left  . Bronchoscopy  02/2011  . Dilation and curettage of uterus  1980's  . Tubal ligation  1980's  . Cardiac catheterization    .  Fetal blood transfusion  09/16/11  . Joint replacement  04/13/10    Left Hip  . Cataract extraction  03/12/2013    Left Eye   . Eye surgery Left 2014    Cataract    REVIEW OF SYSTEMS:  Constitutional: positive for fatigue Eyes: negative Ears, nose, mouth, throat, and face: negative Respiratory: positive for dyspnea on exertion Cardiovascular: negative Gastrointestinal: negative Genitourinary:negative Integument/breast: negative Hematologic/lymphatic: negative Musculoskeletal:negative Neurological: negative Behavioral/Psych: negative Endocrine: negative Allergic/Immunologic: negative   PHYSICAL EXAMINATION: General appearance: alert, cooperative, fatigued and no distress Head: Normocephalic, without obvious abnormality, atraumatic Neck: no adenopathy, no JVD, supple, symmetrical, trachea midline and thyroid not enlarged, symmetric, no tenderness/mass/nodules Lymph nodes: Cervical, supraclavicular, and axillary nodes normal. Resp: clear to auscultation bilaterally Back: symmetric, no curvature. ROM normal. No CVA tenderness. Cardio: regular rate and rhythm, S1, S2 normal, no murmur, click, rub or gallop GI: soft, non-tender; bowel sounds normal; no masses,  no organomegaly Extremities: extremities normal, atraumatic, no cyanosis or edema Neurologic: Alert and oriented X 3, normal strength and tone. Normal symmetric reflexes. Normal coordination and gait  ECOG PERFORMANCE STATUS: 1 - Symptomatic but completely ambulatory  Blood pressure 141/54, pulse 95, temperature 98.5 F (36.9 C), temperature source Oral, resp. rate 19, height _0  (1.676 m), weight 167 lb (75.751 kg), SpO2 99 %.  LABORATORY DATA: Lab Results  Component Value Date   WBC 8.7 03/27/2015   HGB 10.6* 03/27/2015   HCT 32.2* 03/27/2015   MCV 101.6* 03/27/2015   PLT 274 03/27/2015      Chemistry      Component Value Date/Time   NA 139 02/13/2015 0917   NA 142 03/12/2014 1737   NA 138 05/23/2012 1009   NA  142 10/25/2011 1200   K 3.6 02/13/2015 0917   K 3.9 03/12/2014 1737   K 4.6 10/25/2011 1200   CL 97 03/12/2014 1737   CL 99 07/21/2012 1125   CL 97* 10/25/2011 1200   CO2 25 02/13/2015 0917   CO2 27 03/12/2014 1737   CO2 24 10/25/2011 1200   BUN 17.9 02/13/2015 0917   BUN 15 03/12/2014 1737   BUN 22 05/23/2012 1009   BUN 15 10/25/2011 1200   CREATININE 1.4* 02/13/2015 0917   CREATININE 1.10* 03/12/2014 1737   CREATININE 0.8 10/25/2011 1200      Component Value Date/Time   CALCIUM 9.7 02/13/2015 0917   CALCIUM 10.5* 03/12/2014 1737   CALCIUM 9.6 10/25/2011 1200   ALKPHOS 89 02/13/2015 0917   ALKPHOS 86 03/12/2014 1737   ALKPHOS 106* 10/25/2011 1200   AST 68* 02/13/2015 0917   AST 24 03/12/2014 1737   AST 25 10/25/2011 1200   ALT 86* 02/13/2015 0917   ALT 15 03/12/2014 1737   ALT 14 10/25/2011 1200   BILITOT 0.42 02/13/2015 0917   BILITOT 0.3 03/12/2014 1737   BILITOT 0.60 10/25/2011 1200       RADIOGRAPHIC STUDIES: No results found. ASSESSMENT AND PLAN: this is a very pleasant 69 years old white female with history of stage IIB/IIIA non-small cell lung cancer status post concurrent chemoradiation followed by consolidation chemotherapy with carboplatin and Alimta and has been observation since April 2013. She has no evidence for disease progression on his recent scan except for a continuous  enlargement of the right upper lobe opacity. The molecular study for EGFR mutation was reported to be negative. The patient was started on treatment with systemic chemotherapy with carboplatin and Alimta status post 2 cycles but her treatment has been on hold secondary to increasing fatigue and weakness as well as pancytopenia. I do lengthy discussion with the patient today. I recommended for her to have repeat CT scan of the chest for restaging of her disease before resuming her treatment. The patient would come back for follow-up visit in 2 weeks for reevaluation and discussion of her  scan results and further recommendation regarding treatment of her condition. She was advised to call immediately if she has any concerning symptoms in the interval. The patient voices understanding of current disease status and treatment options and is in agreement with the current care plan.  All questions were answered. The patient knows to call the clinic with any problems, questions or concerns. We can certainly see the patient much sooner if necessary.  Disclaimer: This note was dictated with voice recognition software. Similar sounding words can inadvertently be transcribed and may not be corrected upon review.

## 2015-04-03 ENCOUNTER — Ambulatory Visit: Payer: Medicare Other | Admitting: Internal Medicine

## 2015-04-08 ENCOUNTER — Ambulatory Visit (HOSPITAL_COMMUNITY)
Admission: RE | Admit: 2015-04-08 | Discharge: 2015-04-08 | Disposition: A | Discharge status: Home / Self Care | Payer: Medicare Other | Source: Ambulatory Visit | Attending: Internal Medicine | Admitting: Internal Medicine

## 2015-04-08 ENCOUNTER — Other Ambulatory Visit: Payer: Self-pay | Admitting: Internal Medicine

## 2015-04-08 ENCOUNTER — Other Ambulatory Visit (HOSPITAL_BASED_OUTPATIENT_CLINIC_OR_DEPARTMENT_OTHER): Payer: Medicare Other

## 2015-04-08 ENCOUNTER — Encounter (HOSPITAL_COMMUNITY): Payer: Self-pay

## 2015-04-08 DIAGNOSIS — Z85118 Personal history of other malignant neoplasm of bronchus and lung: Secondary | ICD-10-CM | POA: Diagnosis not present

## 2015-04-08 DIAGNOSIS — C3490 Malignant neoplasm of unspecified part of unspecified bronchus or lung: Secondary | ICD-10-CM | POA: Diagnosis not present

## 2015-04-08 DIAGNOSIS — J438 Other emphysema: Secondary | ICD-10-CM | POA: Diagnosis present

## 2015-04-08 DIAGNOSIS — C3411 Malignant neoplasm of upper lobe, right bronchus or lung: Secondary | ICD-10-CM | POA: Insufficient documentation

## 2015-04-08 DIAGNOSIS — C3491 Malignant neoplasm of unspecified part of right bronchus or lung: Secondary | ICD-10-CM

## 2015-04-08 DIAGNOSIS — I2510 Atherosclerotic heart disease of native coronary artery without angina pectoris: Secondary | ICD-10-CM | POA: Insufficient documentation

## 2015-04-08 DIAGNOSIS — Z902 Acquired absence of lung [part of]: Secondary | ICD-10-CM | POA: Insufficient documentation

## 2015-04-08 DIAGNOSIS — J9819 Other pulmonary collapse: Secondary | ICD-10-CM | POA: Diagnosis not present

## 2015-04-08 DIAGNOSIS — Z923 Personal history of irradiation: Secondary | ICD-10-CM | POA: Diagnosis not present

## 2015-04-08 LAB — CBC WITH DIFFERENTIAL/PLATELET
BASO%: 0.7 % (ref 0.0–2.0)
Basophils Absolute: 0.1 10*3/uL (ref 0.0–0.1)
EOS%: 2.8 % (ref 0.0–7.0)
Eosinophils Absolute: 0.2 10*3/uL (ref 0.0–0.5)
HCT: 30 % — ABNORMAL LOW (ref 34.8–46.6)
HGB: 10 g/dL — ABNORMAL LOW (ref 11.6–15.9)
LYMPH%: 19.9 % (ref 14.0–49.7)
MCH: 33.5 pg (ref 25.1–34.0)
MCHC: 33.2 g/dL (ref 31.5–36.0)
MCV: 100.6 fL (ref 79.5–101.0)
MONO#: 0.9 10*3/uL (ref 0.1–0.9)
MONO%: 10.3 % (ref 0.0–14.0)
NEUT#: 5.5 10*3/uL (ref 1.5–6.5)
NEUT%: 66.3 % (ref 38.4–76.8)
Platelets: 267 10*3/uL (ref 145–400)
RBC: 2.98 10*6/uL — AB (ref 3.70–5.45)
RDW: 14.6 % — ABNORMAL HIGH (ref 11.2–14.5)
WBC: 8.3 10*3/uL (ref 3.9–10.3)
lymph#: 1.7 10*3/uL (ref 0.9–3.3)

## 2015-04-08 LAB — COMPREHENSIVE METABOLIC PANEL
ALBUMIN: 3.6 g/dL (ref 3.5–5.0)
ALT: 14 U/L (ref 0–55)
AST: 24 U/L (ref 5–34)
Alkaline Phosphatase: 109 U/L (ref 40–150)
Anion Gap: 13 mEq/L — ABNORMAL HIGH (ref 3–11)
BILIRUBIN TOTAL: 0.38 mg/dL (ref 0.20–1.20)
BUN: 14.1 mg/dL (ref 7.0–26.0)
CO2: 26 meq/L (ref 22–29)
Calcium: 10 mg/dL (ref 8.4–10.4)
Chloride: 102 mEq/L (ref 98–109)
Creatinine: 1.3 mg/dL — ABNORMAL HIGH (ref 0.6–1.1)
EGFR: 40 mL/min/{1.73_m2} — ABNORMAL LOW (ref >90)
GLUCOSE: 97 mg/dL (ref 70–140)
Potassium: 4.2 mEq/L (ref 3.5–5.1)
SODIUM: 141 meq/L (ref 136–145)
TOTAL PROTEIN: 6.7 g/dL (ref 6.4–8.3)

## 2015-04-08 MED ORDER — IOHEXOL 300 MG/ML  SOLN: 75.0000 mL | Freq: Once | INTRAMUSCULAR | Status: DC | PRN

## 2015-04-10 ENCOUNTER — Telehealth: Payer: Self-pay | Admitting: *Deleted

## 2015-04-10 NOTE — Telephone Encounter (Signed)
Returned call to pt regarding folic acid refill. Per MD ok to refill Folic acid or she may purchase OTC. Pt verbalized understanding. No further concerns.

## 2015-04-11 ENCOUNTER — Other Ambulatory Visit: Payer: Self-pay | Admitting: Internal Medicine

## 2015-04-11 ENCOUNTER — Other Ambulatory Visit: Payer: Self-pay | Admitting: *Deleted

## 2015-04-11 DIAGNOSIS — G8929 Other chronic pain: Secondary | ICD-10-CM

## 2015-04-11 DIAGNOSIS — G44229 Chronic tension-type headache, not intractable: Secondary | ICD-10-CM

## 2015-04-11 DIAGNOSIS — M5136 Other intervertebral disc degeneration, lumbar region: Secondary | ICD-10-CM

## 2015-04-11 DIAGNOSIS — M25552 Pain in left hip: Secondary | ICD-10-CM

## 2015-04-11 MED ORDER — HYDROCODONE-ACETAMINOPHEN 10-325 MG PO TABS: ORAL_TABLET | ORAL | Status: DC

## 2015-04-11 MED ORDER — ALPRAZOLAM 1 MG PO TABS: ORAL_TABLET | ORAL | Status: DC

## 2015-04-11 NOTE — Telephone Encounter (Signed)
Patient requested and will pick up 

## 2015-04-15 ENCOUNTER — Encounter (HOSPITAL_COMMUNITY): Payer: Self-pay | Admitting: Emergency Medicine

## 2015-04-15 ENCOUNTER — Telehealth: Payer: Self-pay | Admitting: *Deleted

## 2015-04-15 ENCOUNTER — Emergency Department (HOSPITAL_COMMUNITY): Payer: Medicare Other

## 2015-04-15 ENCOUNTER — Emergency Department (HOSPITAL_COMMUNITY)
Admission: EM | Admit: 2015-04-15 | Discharge: 2015-04-15 | Disposition: A | Discharge status: Home / Self Care | Payer: Medicare Other | Attending: Emergency Medicine | Admitting: Emergency Medicine

## 2015-04-15 DIAGNOSIS — F329 Major depressive disorder, single episode, unspecified: Secondary | ICD-10-CM | POA: Insufficient documentation

## 2015-04-15 DIAGNOSIS — Z7982 Long term (current) use of aspirin: Secondary | ICD-10-CM | POA: Insufficient documentation

## 2015-04-15 DIAGNOSIS — K219 Gastro-esophageal reflux disease without esophagitis: Secondary | ICD-10-CM | POA: Insufficient documentation

## 2015-04-15 DIAGNOSIS — J069 Acute upper respiratory infection, unspecified: Secondary | ICD-10-CM | POA: Diagnosis not present

## 2015-04-15 DIAGNOSIS — Z87440 Personal history of urinary (tract) infections: Secondary | ICD-10-CM | POA: Diagnosis not present

## 2015-04-15 DIAGNOSIS — Z85118 Personal history of other malignant neoplasm of bronchus and lung: Secondary | ICD-10-CM | POA: Diagnosis not present

## 2015-04-15 DIAGNOSIS — F419 Anxiety disorder, unspecified: Secondary | ICD-10-CM | POA: Insufficient documentation

## 2015-04-15 DIAGNOSIS — R05 Cough: Secondary | ICD-10-CM | POA: Diagnosis present

## 2015-04-15 DIAGNOSIS — Z8619 Personal history of other infectious and parasitic diseases: Secondary | ICD-10-CM | POA: Insufficient documentation

## 2015-04-15 DIAGNOSIS — Z86718 Personal history of other venous thrombosis and embolism: Secondary | ICD-10-CM | POA: Insufficient documentation

## 2015-04-15 DIAGNOSIS — I4891 Unspecified atrial fibrillation: Secondary | ICD-10-CM | POA: Insufficient documentation

## 2015-04-15 DIAGNOSIS — M419 Scoliosis, unspecified: Secondary | ICD-10-CM | POA: Diagnosis not present

## 2015-04-15 DIAGNOSIS — Z923 Personal history of irradiation: Secondary | ICD-10-CM | POA: Insufficient documentation

## 2015-04-15 DIAGNOSIS — I509 Heart failure, unspecified: Secondary | ICD-10-CM | POA: Diagnosis not present

## 2015-04-15 DIAGNOSIS — Z9889 Other specified postprocedural states: Secondary | ICD-10-CM | POA: Diagnosis not present

## 2015-04-15 DIAGNOSIS — E785 Hyperlipidemia, unspecified: Secondary | ICD-10-CM | POA: Insufficient documentation

## 2015-04-15 DIAGNOSIS — I10 Essential (primary) hypertension: Secondary | ICD-10-CM | POA: Diagnosis not present

## 2015-04-15 DIAGNOSIS — H9191 Unspecified hearing loss, right ear: Secondary | ICD-10-CM | POA: Insufficient documentation

## 2015-04-15 DIAGNOSIS — D649 Anemia, unspecified: Secondary | ICD-10-CM | POA: Insufficient documentation

## 2015-04-15 DIAGNOSIS — E1149 Type 2 diabetes mellitus with other diabetic neurological complication: Secondary | ICD-10-CM | POA: Insufficient documentation

## 2015-04-15 DIAGNOSIS — J189 Pneumonia, unspecified organism: Secondary | ICD-10-CM | POA: Insufficient documentation

## 2015-04-15 DIAGNOSIS — J439 Emphysema, unspecified: Secondary | ICD-10-CM | POA: Diagnosis not present

## 2015-04-15 DIAGNOSIS — Z87448 Personal history of other diseases of urinary system: Secondary | ICD-10-CM | POA: Diagnosis not present

## 2015-04-15 DIAGNOSIS — Z79899 Other long term (current) drug therapy: Secondary | ICD-10-CM | POA: Insufficient documentation

## 2015-04-15 DIAGNOSIS — I25119 Atherosclerotic heart disease of native coronary artery with unspecified angina pectoris: Secondary | ICD-10-CM | POA: Insufficient documentation

## 2015-04-15 DIAGNOSIS — Z7984 Long term (current) use of oral hypoglycemic drugs: Secondary | ICD-10-CM | POA: Insufficient documentation

## 2015-04-15 DIAGNOSIS — R011 Cardiac murmur, unspecified: Secondary | ICD-10-CM | POA: Diagnosis not present

## 2015-04-15 DIAGNOSIS — Z87891 Personal history of nicotine dependence: Secondary | ICD-10-CM | POA: Insufficient documentation

## 2015-04-15 DIAGNOSIS — M159 Polyosteoarthritis, unspecified: Secondary | ICD-10-CM | POA: Insufficient documentation

## 2015-04-15 DIAGNOSIS — G8929 Other chronic pain: Secondary | ICD-10-CM | POA: Insufficient documentation

## 2015-04-15 LAB — URINALYSIS, ROUTINE W REFLEX MICROSCOPIC
BILIRUBIN URINE: NEGATIVE
Glucose, UA: NEGATIVE mg/dL
KETONES UR: NEGATIVE mg/dL
Leukocytes, UA: NEGATIVE
NITRITE: NEGATIVE
PH: 6 (ref 5.0–8.0)
Protein, ur: 100 mg/dL — AB
SPECIFIC GRAVITY, URINE: 1.013 (ref 1.005–1.030)

## 2015-04-15 LAB — COMPREHENSIVE METABOLIC PANEL
ALBUMIN: 4.1 g/dL (ref 3.5–5.0)
ALT: 10 U/L — ABNORMAL LOW (ref 14–54)
ANION GAP: 14 (ref 5–15)
AST: 26 U/L (ref 15–41)
Alkaline Phosphatase: 86 U/L (ref 38–126)
BILIRUBIN TOTAL: 0.9 mg/dL (ref 0.3–1.2)
BUN: 16 mg/dL (ref 6–20)
CALCIUM: 10 mg/dL (ref 8.9–10.3)
CHLORIDE: 99 mmol/L — AB (ref 101–111)
CO2: 25 mmol/L (ref 22–32)
Creatinine, Ser: 1.06 mg/dL — ABNORMAL HIGH (ref 0.44–1.00)
GFR calc Af Amer: 60 mL/min — ABNORMAL LOW (ref >60)
GFR, EST NON AFRICAN AMERICAN: 52 mL/min — AB (ref >60)
Glucose, Bld: 169 mg/dL — ABNORMAL HIGH (ref 65–99)
POTASSIUM: 3.7 mmol/L (ref 3.5–5.1)
Sodium: 138 mmol/L (ref 135–145)
Total Protein: 7.9 g/dL (ref 6.5–8.1)

## 2015-04-15 LAB — CBC WITH DIFFERENTIAL/PLATELET
BASOS PCT: 0 %
Basophils Absolute: 0 10*3/uL (ref 0.0–0.1)
EOS ABS: 0 10*3/uL (ref 0.0–0.7)
Eosinophils Relative: 0 %
HEMATOCRIT: 30.4 % — AB (ref 36.0–46.0)
HEMOGLOBIN: 9.9 g/dL — AB (ref 12.0–15.0)
LYMPHS ABS: 1 10*3/uL (ref 0.7–4.0)
Lymphocytes Relative: 8 %
MCH: 33.4 pg (ref 26.0–34.0)
MCHC: 32.6 g/dL (ref 30.0–36.0)
MCV: 102.7 fL — ABNORMAL HIGH (ref 78.0–100.0)
MONO ABS: 1.2 10*3/uL — AB (ref 0.1–1.0)
MONOS PCT: 9 %
NEUTROS ABS: 10.7 10*3/uL — AB (ref 1.7–7.7)
Neutrophils Relative %: 83 %
Platelets: 296 10*3/uL (ref 150–400)
RBC: 2.96 MIL/uL — ABNORMAL LOW (ref 3.87–5.11)
RDW: 13.1 % (ref 11.5–15.5)
WBC: 13 10*3/uL — ABNORMAL HIGH (ref 4.0–10.5)

## 2015-04-15 LAB — URINE MICROSCOPIC-ADD ON

## 2015-04-15 LAB — I-STAT CG4 LACTIC ACID, ED: Lactic Acid, Venous: 0.76 mmol/L (ref 0.5–2.0)

## 2015-04-15 MED ORDER — DEXTROSE 5 % IV SOLN
1.0000 g | Freq: Once | INTRAVENOUS | Status: AC
  Administered 2015-04-15: 1 g via INTRAVENOUS
  Filled 2015-04-15: qty 10

## 2015-04-15 MED ORDER — AZITHROMYCIN 250 MG PO TABS: 250.0000 mg | ORAL_TABLET | Freq: Every day | ORAL | Status: DC

## 2015-04-15 MED ORDER — DEXTROSE 5 % IV SOLN
500.0000 mg | Freq: Once | INTRAVENOUS | Status: AC
  Administered 2015-04-15: 500 mg via INTRAVENOUS
  Filled 2015-04-15: qty 500

## 2015-04-15 MED ORDER — HYDROCODONE-HOMATROPINE 5-1.5 MG/5ML PO SYRP: 5.0000 mL | ORAL_SOLUTION | Freq: Four times a day (QID) | ORAL | Status: DC | PRN

## 2015-04-15 NOTE — Telephone Encounter (Signed)
VM message received from patient stating that she is very sick today and wants to cancel her appt with [name] set for tomorrow.  TCT patient. She states she feels very sick, increased cough, coughing up yellow/green secretions, short of breath even wearing her oxygen.. She states she alternates between chills and feeling like she has a fever.  States she wants to cancel her appt with  [name] tomorrow because she is so sick.  Pt does not know her temperature-can't find her thermometer.  Instructed pt to go to ED for evaluation/treatment, not to just sit at home. If she has pneumonia or bronchitis-she needs to be treated appropriately.  Asked pt if she had someone to take her to ED. She stated that she will call her daughter and she assured me she would go to the ED.

## 2015-04-15 NOTE — Discharge Instructions (Signed)

## 2015-04-15 NOTE — ED Provider Notes (Signed)
CSN: [id number]     Arrival date & time 04/15/15  1356 History   First MD Initiated Contact with Patient 04/15/15 1500     Chief Complaint  Patient presents with  . Cough  . Fever  . Weakness      HPI Pt is a patient at the cancer center, hx of lung CA. Has been having fever, productive cough, and weakness over the last couple days. Says she's had to turn her home O2 up to 3L to help her SOB, lungs clear in triage Past Medical History  Diagnosis Date  . 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 Restless legs syndrome (RLS)   . Chronic sinusitis   . Hypertension   . Hyperlipidemia   . Anemia, unspecified   . GERD (gastroesophageal reflux disease)   . Respiratory failure with hypoxia 01/02/2010  . Complication of anesthesia     slow to wake up  . Asthma   . Coronary artery disease     Dr. Burt Knack had stress test done  . Peripheral vascular disease ([place])   . PAD (peripheral artery disease) ([place])   . Heart murmur   . Dysrhythmia     irregular  . Anginal pain ([place])   . History of blood clots     "2 in my aorta"  . Hard of hearing, right   . History of bronchitis   . Bronchial pneumonia     history  . COPD (chronic obstructive pulmonary disease) ([place])   . Emphysema   . Shortness of breath     "anytime really"  . Syncope and collapse   . Chemotherapy adverse reaction 09/15/11    "makes me light headed and pass out; this is 3rd blood transfusion since going thru it"  . History of blood transfusion   . Kidney infection     "related to chemo"  . Sinus headache     "all the time"  . Arthritis     "all over my body"  . Fibromyalgia   . Osteoporosis   . DDD (degenerative disc disease), lumbar   . Scoliosis   . Anxiety   . Mental disorder   . Reflux   . CHF (congestive heart failure) ([place])   . DVT (deep venous thrombosis) ([place])   . Hx of radiation therapy 04/28/11 -06/08/11    right upper lobe-lung  . Acute upper respiratory infections of unspecified site   . Pain in thoracic  spine   . Asymptomatic varicose veins   . Functional urinary incontinence   . Diseases of lips   . Other abnormal blood chemistry   . Effects of radiation, unspecified   . Anemia in neoplastic disease   . Muscle weakness (generalized)   . Dizziness and giddiness   . Spasm of muscle   . Unspecified vitamin D deficiency   . Hyperpotassemia   . Acute posthemorrhagic anemia   . Unspecified hearing loss   . Disorder of bone and cartilage, unspecified   . Screening for thyroid disorder   . Hypopotassemia   . Atherosclerosis of native arteries of the extremities, unspecified   . Reflux esophagitis   . Unspecified constipation   . Unspecified hereditary and idiopathic peripheral neuropathy   . Urinary tract infection, site not specified   . Muscle weakness (generalized)   . Acute sinusitis, unspecified   . Herpes zoster without mention of complication   . 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 Other and unspecified hyperlipidemia   .  Anxiety state, unspecified   . Depressive disorder, not elsewhere classified   . Unspecified essential hypertension   . Atrial fibrillation ([place])   . Chronic airway obstruction, not elsewhere classified   . Osteoarthrosis, unspecified whether generalized or localized, unspecified site   . Lumbago   . Hip pain, chronic 09/17/2014  . Type II or unspecified type diabetes mellitus without mention of complication, not stated as uncontrolled   . Type II or unspecified type diabetes mellitus with neurological manifestations, uncontrolled   . Type II or unspecified type diabetes mellitus without mention of complication, not stated as uncontrolled   . Adenocarcinoma, lung ([place])     upper left lobe  . Non-small cell lung cancer ([place]) 03/31/2011    Adenocarcinoma Rt upper lobe mass  . Malignant neoplasm of bronchus and lung, unspecified site   . Primary cancer of right upper lobe of lung ([place]) 02/08/2011    Biopsy Rt upper lobe mass 03/31/11>>adenocarcinoma  DIAGNOSIS: Stage IIB/IIIA non-small cell lung cancer consistent with adenocarcinoma diagnosed in December 2012.   PRIOR THERAPY:  1) Status post radiation therapy to the right upper lobe lung mass completed on 06/04/2011 under the care of Dr. Pablo Ledger.  2) Systemic chemotherapy with carboplatin for AUC of 5 and Alimta 500 mg/M2 giving every 3    Past Surgical History  Procedure Laterality Date  . Lung lobectomy  1986    left  . Septoplasty    . Total hip arthroplasty  01/2010    left  . Bronchoscopy  02/2011  . Dilation and curettage of uterus  1980's  . Tubal ligation  1980's  . Cardiac catheterization    . Fetal blood transfusion  09/16/11  . Joint replacement  04/13/10    Left Hip  . Cataract extraction  03/12/2013    Left Eye   . Eye surgery Left 2014    Cataract   Family History  Problem Relation Age of Onset  . Bone cancer Father   . Cancer Father     Bone  . Diabetes Mother   . Deep vein thrombosis Mother   . Heart disease Mother     Heart Disease before age 54-  PVD  . Hyperlipidemia Mother   . Hypertension Mother   . Varicose Veins Mother   . Arthritis Daughter    Social History  Substance Use Topics  . Smoking status: Former Smoker -- 1.50 packs/day for 40 years    Types: Cigarettes    Quit date: 02/10/1997  . Smokeless tobacco: Never Used  . Alcohol Use: No   OB History    No data available     Review of Systems  All other systems reviewed and are negative.     Allergies  Iohexol; Other; Anoro ellipta; Codeine; and Protonix  Home Medications   Prior to Admission medications   Medication Sig Start Date End Date Taking? Authorizing Provider  albuterol-ipratropium (COMBIVENT) 18-103 MCG/ACT inhaler Inhale 2 puffs into the lungs every 4 (four) hours as needed (lung cancer).  02/21/13  Yes Mahima Bubba Camp, MD  ALPRAZolam Duanne Moron) 1 MG tablet Take one tablet by mouth three times daily as needed for anxiety 04/11/15  Yes Tiffany L Reed, DO  aspirin 81 MG  tablet Take 81 mg by mouth daily.   Yes Historical Provider, MD  citalopram (CELEXA) 20 MG tablet TAKE 1 TABLET BY MOUTH EVERY DAY TO HELP MOOD Patient taking differently: 1 tablet by mouth every day to help mood. 11/15/14  Yes Arnell Sieving  Joaquin Courts, MD  COMBIVENT RESPIMAT 20-100 MCG/ACT AERS respimat INHALE 1 PUFF BY MOUTH EVERY 6 HOURS AS NEEDED FOR SHORTNESS OF BREATH Patient taking differently: 1 puff by mouth every 6 hours as ne 02/13/15  Yes Estill Dooms, MD  diltiazem (CARDIZEM CD) 240 MG 24 hr capsule TAKE 1 CAPSULE BY MOUTH EVERY DAY FOR BLOOD PRESSURE Patient taking differently: 1 capsule every  day for blood pressure. 03/13/15  Yes Estill Dooms, MD  Ferrous Fumarate-Folic Acid (HEMATINIC/FOLIC ACID) 481-8 MG TABS Take 1 tablet by mouth daily. 01/27/15  Yes Curt Bears, MD  folic acid (FOLVITE) 1 MG tablet Take 1 tablet (1 mg total) by mouth daily. 12/23/14  Yes Curt Bears, MD  gemfibrozil (LOPID) 600 MG tablet TAKE 1 TABLET BY MOUTH ONCE DAILY Patient taking differently: Take 1 tablet by mouth once daily. 01/15/15  Yes Estill Dooms, MD  glucose blood (FREESTYLE LITE) test strip Use to test blood sugar three times daily. Dx: E11.9, E11.49 10/10/14  Yes Estill Dooms, MD  glucose blood test strip Check blood sugar once daily  DX: 250.00 08/01/13  Yes Estill Dooms, MD  HYDROcodone-acetaminophen [facility]) 10-325 MG tablet Take One tablet by mouth every 6 hours if needed for pain 04/11/15  Yes Tiffany L Reed, DO  Lancets (FREESTYLE) lancets Use to test blood sugar three times daily. Dx: E11.9, E11.49 10/10/14  Yes Estill Dooms, MD  loratadine (CLARITIN) 10 MG tablet TAKE 1 TABLET BY MOUTH EVERY DAY Patient taking differently: 1 tablet by mouth every  day. 11/21/14  Yes Collene Gobble, MD  losartan (COZAAR) 50 MG tablet TAKE 1 TABLET BY MOUTH TWICE DAILY Patient taking differently: 1 tablet by mouth twice daily. 01/15/15  Yes Estill Dooms, MD  metFORMIN (GLUCOPHAGE) 500 MG tablet TAKE 1  TABLET BY MOUTH TWICE DAILY TO CONTROL BLOOD SUGAR Patient taking differently: 1 tablet by mouth twice daily to control blood sugar. 03/13/15  Yes Estill Dooms, MD  Multiple Vitamins-Minerals (CENTRUM SILVER PO) Take 1 tablet by mouth daily. Patient unsure of dose   Yes Historical Provider, MD  NON FORMULARY Place 3 L into the nose daily. Oxygen   Yes Historical Provider, MD  nystatin (MYCOSTATIN) 100000 UNIT/ML suspension  02/11/15  Yes Historical Provider, MD  potassium chloride (K-DUR,KLOR-CON) 10 MEQ tablet TAKE 1 TABLET BY MOUTH EVERY DAY Patient taking differently: 10 MEQ (1 tablet) by mouth every day. 11/15/14  Yes Estill Dooms, MD  ranitidine (ZANTAC) 300 MG tablet TAKE 1 TABLET BY MOUTH EVERY NIGHT AT BEDTIME Patient taking differently: Take 1 tablet by mouth every night at bedtime. 01/15/15  Yes Estill Dooms, MD  traMADol (ULTRAM) 50 MG tablet TAKE 1 TABLET BY MOUTH THREE TIMES A DAY AS NEEDED FOR PAIN Patient taking differently: Take 1 tablet by mouth 3 times a day as needed for pain. 12/20/14  Yes Tiffany L Reed, DO  triamterene-hydrochlorothiazide (MAXZIDE-25) 37.5-25 MG per tablet TAKE 1 TABLET BY MOUTH EVERY DAY TO CONTROL BLOOD PRESSURE Patient taking differently: Take 1 tablet by mouth every day to control blood pressure. 11/15/14  Yes Estill Dooms, MD  AMBULATORY NON FORMULARY MEDICATION Lighter Weight Rollator with Padded Seat and Hand Brakes Dx: 563, 729.1, 722.52 01/24/13   Blanchie Serve, MD  AMBULATORY NON FORMULARY MEDICATION Raised Commode Seat with Rail Dx: M51.36, M54.5 10/29/14   Estill Dooms, MD  azithromycin (ZITHROMAX) 250 MG tablet Take 1 tablet (250 mg total) by mouth daily.  Take 1 tablet a day starting tomorrow until finished. 04/15/15   Leonard Schwartz, MD  fluconazole (DIFLUCAN) 100 MG tablet Take 1 tablet (100 mg total) by mouth daily. Patient not taking: Reported on 04/15/2015 02/11/15   Curt Bears, MD  fluticasone [facility]) 50 MCG/ACT nasal spray INSTILL 2  SPRAYS INTO BOTH NOSTRILS  TWICE DAILY Patient not taking: Reported on 04/15/2015 07/01/14   Collene Gobble, MD  magic mouthwash SOLN Take 5 mLs by mouth 4 (four) times daily. Patient not taking: Reported on 04/15/2015 02/11/15   Curt Bears, MD  metoCLOPramide (REGLAN) 5 MG tablet TAKE 1 TABLET BY MOUTH 4 TIMES A DAY Patient not taking: Reported on 04/15/2015 03/13/15   Estill Dooms, MD  prochlorperazine (COMPAZINE) 10 MG tablet Take 1 tablet (10 mg total) by mouth every 6 (six) hours as needed for nausea or vomiting. Patient not taking: Reported on 04/15/2015 01/27/15   Curt Bears, MD   BP 132/50 mmHg  Pulse 97  Temp(Src) 100.2 F (37.9 C) (Oral)  Resp 21  SpO2 100% Physical Exam  Constitutional: She is oriented to person, place, and time. She appears well-developed and well-nourished. No distress.  HENT:  Head: Normocephalic and atraumatic.  Eyes: Pupils are equal, round, and reactive to light.  Neck: Normal range of motion.  Cardiovascular: Normal rate and intact distal pulses.   Pulmonary/Chest: No respiratory distress. She has no wheezes.  Abdominal: Normal appearance. She exhibits no distension. There is no tenderness. There is no rebound.  Musculoskeletal: Normal range of motion.  Neurological: She is alert and oriented to person, place, and time. No cranial nerve deficit.  Skin: Skin is warm and dry. No rash noted.  Psychiatric: She has a normal mood and affect. Her behavior is normal.  Nursing note and vitals reviewed.   ED Course  Procedures (including critical care time) Medications  azithromycin (ZITHROMAX) 500 mg in dextrose 5 % 250 mL IVPB (500 mg Intravenous New Bag/Given 04/15/15 1817)  cefTRIAXone (ROCEPHIN) 1 g in dextrose 5 % 50 mL IVPB (1 g Intravenous New Bag/Given 04/15/15 1753)   Labs Review Labs Reviewed  COMPREHENSIVE METABOLIC PANEL - Abnormal; Notable for the following:    Chloride 99 (*)    Glucose, Bld 169 (*)    Creatinine, Ser 1.06 (*)    ALT 10  (*)    GFR calc non Af Amer 52 (*)    GFR calc Af Amer 60 (*)    All other components within normal limits  CBC WITH DIFFERENTIAL/PLATELET - Abnormal; Notable for the following:    WBC 13.0 (*)    RBC 2.96 (*)    Hemoglobin 9.9 (*)    HCT 30.4 (*)    MCV 102.7 (*)    Neutro Abs 10.7 (*)    Monocytes Absolute 1.2 (*)    All other components within normal limits  URINALYSIS, ROUTINE W REFLEX MICROSCOPIC (NOT AT [facility]) - Abnormal; Notable for the following:    APPearance CLOUDY (*)    Hgb urine dipstick SMALL (*)    Protein, ur 100 (*)    All other components within normal limits  URINE MICROSCOPIC-ADD ON - Abnormal; Notable for the following:    Squamous Epithelial / LPF 6-30 (*)    Bacteria, UA FEW (*)    Casts GRANULAR CAST (*)    All other components within normal limits  CULTURE, BLOOD (ROUTINE X 2)  CULTURE, BLOOD (ROUTINE X 2)  I-STAT CG4 LACTIC ACID, ED  I-STAT CG4  LACTIC ACID, ED    Imaging Review Dg Chest 2 View  04/15/2015  CLINICAL DATA:  Short of breath and cough for 3 days EXAM: CHEST  2 VIEW COMPARISON:  04/08/2015 FINDINGS: Stable right upper lobe collapse. Stable cavitary lesion in the right middle lobe. Stable scarring in the left upper lobe. Normal heart size. Postoperative changes in the left hilum IMPRESSION: Chronic changes. Electronically Signed   By: Marybelle Killings M.D.   On: 04/15/2015 14:46   I have personally reviewed and evaluated these images and lab results as part of my medical decision-making.   EKG Interpretation   Date/Time:  Tuesday April 15 2015 14:05:37 EST Ventricular Rate:  114 PR Interval:  176 QRS Duration: 73 QT Interval:  375 QTC Calculation: 516 R Axis:   70 Text Interpretation:  Sinus tachycardia RSR' in V1 or V2, right VCD or RVH  Nonspecific T abnormalities, diffuse leads Prolonged QT interval Baseline  wander in lead(s) II Abnormal ekg Confirmed by Audie Pinto  MD, Tamaira Ciriello ([id number])  on 04/15/2015 3:13:01 PM      MDM   Final  diagnoses:  URI (upper respiratory infection)        Leonard Schwartz, MD 04/15/15 4970

## 2015-04-15 NOTE — ED Notes (Signed)
Pt is a patient at the cancer center, hx of lung CA. Has been having fever, productive cough, and weakness over the last couple days. Says she's had to turn her home O2 up to 3L to help her SOB, lungs clear in triage.

## 2015-04-16 ENCOUNTER — Telehealth: Payer: Self-pay | Admitting: *Deleted

## 2015-04-16 ENCOUNTER — Other Ambulatory Visit: Payer: Self-pay | Admitting: Internal Medicine

## 2015-04-16 ENCOUNTER — Ambulatory Visit: Payer: Medicare Other | Admitting: Internal Medicine

## 2015-04-16 NOTE — Telephone Encounter (Signed)
Patient called stating that she recently went to the ED due to URI. Patient was given azithromycin 250 mg. Patient states this is not working and would like something else. States that she is still coughing up green mucus and having diarrhea. Message sent to MD Mohamed/RN Diane.

## 2015-04-16 NOTE — Telephone Encounter (Signed)
Symptom management clinic or PCP

## 2015-04-17 ENCOUNTER — Other Ambulatory Visit: Payer: Self-pay | Admitting: Medical Oncology

## 2015-04-17 ENCOUNTER — Telehealth: Payer: Self-pay | Admitting: Internal Medicine

## 2015-04-17 NOTE — Telephone Encounter (Signed)
Spoke with  Patient and patient declines visit in the [facility] per 1/5 pof for today or tomorrow. Per patient she does not feel well and does not want to come, however patient does want something stronger to be called in for her that what was given in the ED. Patient forwarded to desk nurse. This message has also been forwarded to desk nurse and MM.

## 2015-04-17 NOTE — Telephone Encounter (Signed)
I told pt to continue her antibiotic. I told her to call or go to Ed if she is not feeling better by sat or if symptoms worsen call or go to ED earlier.

## 2015-04-17 NOTE — Telephone Encounter (Signed)
Onc tx request sent urgently for appt today.

## 2015-04-20 LAB — CULTURE, BLOOD (ROUTINE X 2): CULTURE: NO GROWTH

## 2015-04-23 ENCOUNTER — Telehealth: Payer: Self-pay | Admitting: Emergency Medicine

## 2015-04-23 NOTE — Telephone Encounter (Signed)
Spoke with pt. She has been scheduled to see RB tomorrow at 3:30pm. Nothing further was needed.

## 2015-04-24 ENCOUNTER — Ambulatory Visit (INDEPENDENT_AMBULATORY_CARE_PROVIDER_SITE_OTHER): Payer: Medicare Other | Admitting: Emergency Medicine

## 2015-04-24 ENCOUNTER — Encounter: Payer: Self-pay | Admitting: Emergency Medicine

## 2015-04-24 VITALS — BP 120/60 | HR 96 | Ht 66.0 in | Wt 163.0 lb

## 2015-04-24 DIAGNOSIS — J209 Acute bronchitis, unspecified: Secondary | ICD-10-CM

## 2015-04-24 DIAGNOSIS — J438 Other emphysema: Secondary | ICD-10-CM | POA: Diagnosis not present

## 2015-04-24 DIAGNOSIS — C3411 Malignant neoplasm of upper lobe, right bronchus or lung: Secondary | ICD-10-CM | POA: Diagnosis not present

## 2015-04-24 MED ORDER — LEVOFLOXACIN 500 MG PO TABS: 500.0000 mg | ORAL_TABLET | Freq: Every day | ORAL | Status: DC

## 2015-04-24 NOTE — Assessment & Plan Note (Signed)
Chemo in November took a lot out of her, significant fatigue. The CTC chest from 12/27 would suggest that there was a positive response. ? Whether she will be able to tolerate more therapy. Will follow with Dr Julien Nordmann

## 2015-04-24 NOTE — Patient Instructions (Addendum)
Please start taking your combivent 2 puffs 4 times a day until your next visit. At that time we can discuss cutting back down to twice a day  Take levaquin '500mg'$  daily for 1 week.  Wear your oxygen at all times.  Follow with Dr Lamonte Sakai or with T Parrett in 1 month to assess for improvement and to review your medications.

## 2015-04-24 NOTE — Assessment & Plan Note (Signed)
Clinical dx, following an apparentr viral process. Will treat with levaquin, although suspect that this mayt be flu which will have to run its course. She is starting guaifenesin as well. I will have her follow in 1 month to insure improvement

## 2015-04-24 NOTE — Assessment & Plan Note (Signed)
She has had a viral infection, ? Flu. Clearly decompensated during this time although no wheeze at this time. She needs to increase the combivent up to full dose qid. ? Acute bronchitis. I will defer steroids in absence wheeze. Treat  w abx as below.

## 2015-04-24 NOTE — Progress Notes (Signed)
Subjective:    Patient ID: [name], female    DOB: September 17, 1945, 70 y.o.   MRN: [id number] HPI History of Present Illness:  [name] is a 70 y.o. 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 Has received XRT and chemo with Drs Pablo Ledger and Julien Nordmann.  She tolerated XRT completely. She has completed 3 cycles chemo.  Currently managed on combivent qid, started O2.   ROV 08/01/14 -- follow-up visit for COPD and adenocarcinoma of the long in the right upper lobe. She underwent chemotherapy and radiation therapy and is currently under observation.  Her last CT scan was 07/29/14. There was some possible increase in groundglass the right upper lung zone. She c/o being more weak, more DOE. She is more fatigued. Her o2 is set on 2L/min, occasionally to 3L/min.   ROV 04/24/15 -- follow up visit for COPD and RUL adenoCA. Her last chemotherapy was in November '16, she hasn't had any since due to side effects and fatigue. Most recent CT was 12/27 which I have personally reviewed. Shows interval decrease in size of her RML GG nodule. She has felt worse for the last 3 weeks. She has had cough, rhinorrhea, congestion, subjective fever, dizziness, upset stomach and felt bad for 3 weeks. She was seen in the ED 1/3, treated with azithro. No improvement. She is taking combivent prn, about 2x a day. She started mucinex yesterday.      Objective:   Physical Exam Filed Vitals:   04/24/15 1533  BP: 120/60  Pulse: 96  Height: '5\' 6"'$  (1.676 m)  Weight: 163 lb (73.936 kg)  SpO2: 96%   Gen: Pleasant, elderly woman, in no distress,  normal affect  ENT: No lesions,  mouth clear,  oropharynx clear, no postnasal drip  Neck: No JVD, no TMG, no carotid bruits  Lungs: No use of accessory muscles, distant, no wheeze  Cardiovascular: RRR, heart sounds normal, no murmur or gallops, no peripheral edema  Musculoskeletal: No deformities, no cyanosis or clubbing. Tender to palp of the R  pectoral area  Neuro: alert, non focal  Skin: Warm, no lesions or rashes  CBC Latest Ref Rng 04/15/2015 04/08/2015 03/27/2015  WBC 4.0 - 10.5 K/uL 13.0(H) 8.3 8.7  Hemoglobin 12.0 - 15.0 g/dL 9.9(L) 10.0(L) 10.6(L)  Hematocrit 36.0 - 46.0 % 30.4(L) 30.0(L) 32.2(L)  Platelets 150 - 400 K/uL 296 267 274   BMET    Component Value Date/Time   NA 138 04/15/2015 1455   NA 141 04/08/2015 1310   NA 142 03/12/2014 1737   NA 142 10/25/2011 1200   K 3.7 04/15/2015 1455   K 4.2 04/08/2015 1310   K 4.6 10/25/2011 1200   CL 99* 04/15/2015 1455   CL 99 07/21/2012 1125   CL 97* 10/25/2011 1200   CO2 25 04/15/2015 1455   CO2 26 04/08/2015 1310   CO2 24 10/25/2011 1200   GLUCOSE 169* 04/15/2015 1455   GLUCOSE 97 04/08/2015 1310   GLUCOSE 98 03/12/2014 1737   GLUCOSE 184* 07/21/2012 1125   GLUCOSE 167* 10/25/2011 1200   BUN 16 04/15/2015 1455   BUN 14.1 04/08/2015 1310   BUN 15 03/12/2014 1737   BUN 15 10/25/2011 1200   CREATININE 1.06* 04/15/2015 1455   CREATININE 1.3* 04/08/2015 1310   CREATININE 0.8 10/25/2011 1200   CALCIUM 10.0 04/15/2015 1455   CALCIUM 10.0 04/08/2015 1310   CALCIUM 9.6 10/25/2011 1200   GFRNONAA 52* 04/15/2015 1455   GFRAA 60*  04/15/2015 1455     04/08/15 --  CLINICAL DATA: 70 year old female with history of lung cancer diagnosed in 2012 status post chemotherapy and radiation therapy which are now complete. Additional remote history of lung cancer diagnosed in 1986 status post surgical resection at that time. Chest pain, cough and shortness of breath for 1 year.  EXAM: CT CHEST WITHOUT CONTRAST  TECHNIQUE: Multidetector CT imaging of the chest was performed following the standard protocol without IV contrast.  COMPARISON: Chest CT 12/02/2014.  FINDINGS: Mediastinum/Lymph Nodes: Heart size is normal. There is no significant pericardial fluid, thickening or pericardial calcification. There is atherosclerosis of the thoracic aorta, the great  vessels of the mediastinum and the coronary arteries, including calcified atherosclerotic plaque in the left anterior descending and left circumflex coronary arteries. No pathologically enlarged mediastinal or hilar lymph nodes. Please note that accurate exclusion of hilar adenopathy is limited on noncontrast CT scans. Esophagus is unremarkable in appearance. No axillary lymphadenopathy.  Lungs/Pleura: Postoperative changes of left upper lobectomy again noted, with compensatory hyperexpansion of the left lower lobe. Chronic collapse of the right upper lobe is redemonstrated. Again noted is a mass-like area of ground-glass attenuation in the hyperexpanded right middle lobe (previously referred to as right "upper" lobe), which appears slightly smaller and slightly less dense when compared to the prior study, currently measuring 4.2 x 6.8 cm (image 26 of series 5), previously 4.6 x 7.4 cm on 12/02/2014. Several other smaller pulmonary nodules and areas of ground-glass attenuation are again noted, and are either stable in size or appear slightly smaller than the prior examination. Specific examples include a stable 8 mm left lower lobe nodule (image 26 of series 5), and a 6 mm ground-glass attenuation nodule in the periphery of the right lower lobe associated with the minor fissure (image 30 of series 5) which previously measured 10 mm. Mild diffuse bronchial wall thickening with mild centrilobular and paraseptal emphysema. Small amount of chronic pleural thickening in the posterior aspect of the right hemithorax, similar to prior studies. Previously noted trace right pleural effusion has resolved.  Upper Abdomen: Calcified gallstones in the gallbladder measuring up to 7 mm in the neck. Atherosclerosis. 14 mm low-attenuation (-30 HU) left adrenal nodule compatible with an adenoma. 17 mm low-attenuation (-27 HU) right adrenal nodule also compatible with an  adenoma.  Musculoskeletal/Soft Tissues: Old compression fractures of T4 and T5 again noted approximately with 30% loss of central vertebral body height at both levels, and post vertebroplasty changes in the left side of the T4 vertebral body. Healing fracture of the posterior aspect of the right fifth rib again noted. There are no aggressive appearing lytic or blastic lesions noted in the visualized portions of the skeleton.  IMPRESSION: 1. Overall, today's study demonstrates a positive response to therapy with decreased size of the large ground-glass attenuation lesion in the right middle lobe, stable to slightly decreased size of other previously noted pulmonary nodules, as detailed above. 2. Trace right-sided pleural effusion has also resolved, and the right-sided pleural thickening is stable compared to prior examinations. 3. Status post left upper lobectomy. 4. Complete chronic collapse of the right upper lobe, similar to prior examinations. 5. Atherosclerosis, including 2 vessel coronary artery disease. Assessment for potential risk factor modification, dietary therapy or pharmacologic therapy may be warranted, if clinically indicated.       Assessment & Plan:  COPD (chronic obstructive pulmonary disease) ([place]) She has had a viral infection, ? Flu. Clearly decompensated during this time although no  wheeze at this time. She needs to increase the combivent up to full dose qid. ? Acute bronchitis. I will defer steroids in absence wheeze. Treat  w abx as below.   Acute bronchitis Clinical dx, following an apparentr viral process. Will treat with levaquin, although suspect that this mayt be flu which will have to run its course. She is starting guaifenesin as well. I will have her follow in 1 month to insure improvement   Primary cancer of right upper lobe of lung ([place]) Chemo in November took a lot out of her, significant fatigue. The CTC chest from 12/27 would suggest that  there was a positive response. ? Whether she will be able to tolerate more therapy. Will follow with Dr Julien Nordmann

## 2015-04-25 ENCOUNTER — Telehealth: Payer: Self-pay | Admitting: Emergency Medicine

## 2015-04-25 MED ORDER — ALBUTEROL SULFATE HFA 108 (90 BASE) MCG/ACT IN AERS: 2.0000 | INHALATION_SPRAY | RESPIRATORY_TRACT | Status: DC | PRN

## 2015-04-25 NOTE — Telephone Encounter (Signed)
Spoke with pt. She is aware of RB's recommendation. Rx has been sent in. Nothing further was needed.

## 2015-04-25 NOTE — Telephone Encounter (Signed)
Pt c/o increased shortness of breath, increased anxiety, difficulty sleeping, prod cough with white/clear mucus, sinus congestion, ear congestion.   Denies fever.  Pt has been taking combivent qid. Pt requesting further recs.  Was seen yesterday by RB.  Pt uses [facility] on [address] and [address].    RB please advise on recs.  Thanks.

## 2015-04-25 NOTE — Telephone Encounter (Signed)
Pt says that she is having problems breathing.[name]

## 2015-04-25 NOTE — Telephone Encounter (Signed)
Please have her continue the combivent 2 puffs qid Call in albuterol 2 puffs q4h prn for sob

## 2015-04-25 NOTE — Telephone Encounter (Signed)
Pt calling back to check on the status of refill request for an emergency inhaler please call pt asap says she was told to use emergency inhaler but she doesn't have one.[name]

## 2015-05-06 ENCOUNTER — Telehealth: Payer: Self-pay | Admitting: Internal Medicine

## 2015-05-06 NOTE — Telephone Encounter (Signed)
Patient called to schedule an appointment to see Dr Julien Nordmann. Appointment set and patient aware

## 2015-05-13 ENCOUNTER — Other Ambulatory Visit: Payer: Self-pay | Admitting: *Deleted

## 2015-05-13 DIAGNOSIS — G8929 Other chronic pain: Secondary | ICD-10-CM

## 2015-05-13 DIAGNOSIS — G44229 Chronic tension-type headache, not intractable: Secondary | ICD-10-CM

## 2015-05-13 DIAGNOSIS — M25552 Pain in left hip: Secondary | ICD-10-CM

## 2015-05-13 DIAGNOSIS — M5136 Other intervertebral disc degeneration, lumbar region: Secondary | ICD-10-CM

## 2015-05-13 MED ORDER — HYDROCODONE-ACETAMINOPHEN 10-325 MG PO TABS: ORAL_TABLET | ORAL | Status: DC

## 2015-05-13 NOTE — Telephone Encounter (Signed)
Patient requested and will pick up. Informed patient that she needs to schedule an appointment to be seen. Patient stated that she will schedule one tomorrow when she picks up her Rx.

## 2015-05-14 ENCOUNTER — Ambulatory Visit (HOSPITAL_BASED_OUTPATIENT_CLINIC_OR_DEPARTMENT_OTHER): Payer: Medicare Other | Admitting: Internal Medicine

## 2015-05-14 ENCOUNTER — Other Ambulatory Visit: Payer: Self-pay | Admitting: Internal Medicine

## 2015-05-14 ENCOUNTER — Encounter: Payer: Self-pay | Admitting: Internal Medicine

## 2015-05-14 VITALS — BP 158/49 | HR 102 | Temp 98.9°F | Resp 18 | Wt 164.9 lb

## 2015-05-14 DIAGNOSIS — C3411 Malignant neoplasm of upper lobe, right bronchus or lung: Secondary | ICD-10-CM | POA: Diagnosis not present

## 2015-05-14 DIAGNOSIS — J449 Chronic obstructive pulmonary disease, unspecified: Secondary | ICD-10-CM

## 2015-05-14 NOTE — Progress Notes (Signed)
[name] [phone]   [phone]  OFFICE PROGRESS NOTE  GREEN, [name], MD [address] [place] [address]  DIAGNOSIS: Progressive non-small cell lung cancer initially diagnosed as Stage IIB/IIIA non-small cell lung cancer consistent with adenocarcinoma with negative EGFR mutation and negative ALK gene translocation diagnosed in December 2012.   PRIOR THERAPY:  1) Status post radiation therapy to the right upper lobe lung mass completed on 06/04/2011 under the care of Dr. Pablo Ledger.  2) Systemic chemotherapy with carboplatin for AUC of 5 and Alimta 500 mg/M2 giving every 3 weeks, status post 3 cycles, last dose was given on 07/21/2011.   CURRENT THERAPY: Systemic chemotherapy with carboplatin for AUC of 5 and Alimta 500 MG/M2 every 3 weeks. First dose on 01/02/2015. Status post 2 cycles, last dose was given 01/30/2015. Her treatment is currently on hold secondary to intolerance.  INTERVAL HISTORY: [name] 70 y.o. female returns to the clinic today for routine four month follow up visit accompanied by her daughter. The patient has been off treatment for around 4 months. She had rough time with her systemic chemotherapy with increasing fatigue and weakness as well as pancytopenia. Her treatment was discontinued. She was also treated last month's for upper respiratory infection. She is feeling a little bit better today with no specific complaints except for the baseline shortness of breath. She is still on home oxygen 2 L/minute. She denied having any significant weight loss or night sweats. She has no nausea or vomiting, no fever or chills. She had repeat CT scan of the chest in late December 2016 but she did not show up for the follow-up visit. She is here today for reevaluation and discussion of her treatment options.  MEDICAL HISTORY: Past Medical History  Diagnosis Date  . 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 Restless legs  syndrome (RLS)   . Chronic sinusitis   . Hypertension   . Hyperlipidemia   . Anemia, unspecified   . GERD (gastroesophageal reflux disease)   . Respiratory failure with hypoxia 01/02/2010  . Complication of anesthesia     slow to wake up  . Asthma   . Coronary artery disease     Dr. Burt Knack had stress test done  . Peripheral vascular disease ([place])   . PAD (peripheral artery disease) ([place])   . Heart murmur   . Dysrhythmia     irregular  . Anginal pain ([place])   . History of blood clots     "2 in my aorta"  . Hard of hearing, right   . History of bronchitis   . Bronchial pneumonia     history  . COPD (chronic obstructive pulmonary disease) ([place])   . Emphysema   . Shortness of breath     "anytime really"  . Syncope and collapse   . Chemotherapy adverse reaction 09/15/11    "makes me light headed and pass out; this is 3rd blood transfusion since going thru it"  . History of blood transfusion   . Kidney infection     "related to chemo"  . Sinus headache     "all the time"  . Arthritis     "all over my body"  . Fibromyalgia   . Osteoporosis   . DDD (degenerative disc disease), lumbar   . Scoliosis   . Anxiety   . Mental disorder   . Reflux   . CHF (congestive heart failure) ([place])   . DVT (  deep venous thrombosis) ([place])   . Hx of radiation therapy 04/28/11 -06/08/11    right upper lobe-lung  . Acute upper respiratory infections of unspecified site   . Pain in thoracic spine   . Asymptomatic varicose veins   . Functional urinary incontinence   . Diseases of lips   . Other abnormal blood chemistry   . Effects of radiation, unspecified   . Anemia in neoplastic disease   . Muscle weakness (generalized)   . Dizziness and giddiness   . Spasm of muscle   . Unspecified vitamin D deficiency   . Hyperpotassemia   . Acute posthemorrhagic anemia   . Unspecified hearing loss   . Disorder of bone and cartilage, unspecified   . Screening for thyroid disorder   . Hypopotassemia   .  Atherosclerosis of native arteries of the extremities, unspecified   . Reflux esophagitis   . Unspecified constipation   . Unspecified hereditary and idiopathic peripheral neuropathy   . Urinary tract infection, site not specified   . Muscle weakness (generalized)   . Acute sinusitis, unspecified   . Herpes zoster without mention of complication   . 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 Other and unspecified hyperlipidemia   . Anxiety state, unspecified   . Depressive disorder, not elsewhere classified   . Unspecified essential hypertension   . Atrial fibrillation ([place])   . Chronic airway obstruction, not elsewhere classified   . Osteoarthrosis, unspecified whether generalized or localized, unspecified site   . Lumbago   . Hip pain, chronic 09/17/2014  . Type II or unspecified type diabetes mellitus without mention of complication, not stated as uncontrolled   . Type II or unspecified type diabetes mellitus with neurological manifestations, uncontrolled   . Type II or unspecified type diabetes mellitus without mention of complication, not stated as uncontrolled   . Adenocarcinoma, lung ([place])     upper left lobe  . Non-small cell lung cancer ([place]) 03/31/2011    Adenocarcinoma Rt upper lobe mass  . Malignant neoplasm of bronchus and lung, unspecified site   . Primary cancer of right upper lobe of lung ([place]) 02/08/2011    Biopsy Rt upper lobe mass 03/31/11>>adenocarcinoma DIAGNOSIS: Stage IIB/IIIA non-small cell lung cancer consistent with adenocarcinoma diagnosed in December 2012.   PRIOR THERAPY:  1) Status post radiation therapy to the right upper lobe lung mass completed on 06/04/2011 under the care of Dr. Pablo Ledger.  2) Systemic chemotherapy with carboplatin for AUC of 5 and Alimta 500 mg/M2 giving every 3     ALLERGIES:  is allergic to iohexol; other; anoro ellipta; codeine; and protonix.  MEDICATIONS:  Current Outpatient Prescriptions  Medication Sig Dispense Refill  .  albuterol (PROVENTIL HFA;VENTOLIN HFA) 108 (90 Base) MCG/ACT inhaler Inhale 2 puffs into the lungs every 4 (four) hours as needed for wheezing or shortness of breath. 1 Inhaler 2  . albuterol-ipratropium (COMBIVENT) 18-103 MCG/ACT inhaler Inhale 2 puffs into the lungs every 4 (four) hours as needed (lung cancer).     . ALPRAZolam (XANAX) 1 MG tablet Take one tablet by mouth three times daily as needed for anxiety 90 tablet 0  . AMBULATORY NON FORMULARY MEDICATION Lighter Weight Rollator with Padded Seat and Hand Brakes Dx: 496, 729.1, 722.52 1 each 0  . AMBULATORY NON FORMULARY MEDICATION Raised Commode Seat with Rail Dx: M51.36, M54.5 1 each 0  . aspirin 81 MG tablet Take 81 mg by mouth daily.    . citalopram (CELEXA)  20 MG tablet TAKE 1 TABLET BY MOUTH EVERY DAY TO HELP MOOD 90 tablet 0  . COMBIVENT RESPIMAT 20-100 MCG/ACT AERS respimat INHALE 1 PUFF BY MOUTH EVERY 6 HOURS AS NEEDED FOR SHORTNESS OF BREATH (Patient taking differently: 1 puff by mouth every 6 hours as ne) 12 Inhaler 3  . diltiazem (CARDIZEM CD) 240 MG 24 hr capsule TAKE 1 CAPSULE BY MOUTH EVERY DAY FOR BLOOD PRESSURE (Patient taking differently: 1 capsule every  day for blood pressure.) 30 capsule 2  . Ferrous Fumarate-Folic Acid (HEMATINIC/FOLIC ACID) 811-0 MG TABS Take 1 tablet by mouth daily. 30 each 0  . folic acid (FOLVITE) 1 MG tablet Take 1 tablet (1 mg total) by mouth daily. 30 tablet 4  . gemfibrozil (LOPID) 600 MG tablet TAKE 1 TABLET BY MOUTH ONCE DAILY (Patient taking differently: Take 1 tablet by mouth once daily.) 30 tablet 4  . glucose blood (FREESTYLE LITE) test strip Use to test blood sugar three times daily. Dx: E11.9, E11.49 100 each 12  . glucose blood test strip Check blood sugar once daily  DX: 250.00    . HYDROcodone-acetaminophen (NORCO) 10-325 MG tablet Take One tablet by mouth every 6 hours if needed for pain 120 tablet 0  . HYDROcodone-homatropine (HYCODAN) 5-1.5 MG/5ML syrup Take 5 mLs by mouth every 6  (six) hours as needed for cough. 120 mL 0  . Lancets (FREESTYLE) lancets Use to test blood sugar three times daily. Dx: E11.9, E11.49 100 each 12  . levofloxacin (LEVAQUIN) 500 MG tablet Take 1 tablet (500 mg total) by mouth daily. 7 tablet 0  . loratadine (CLARITIN) 10 MG tablet TAKE 1 TABLET BY MOUTH EVERY DAY (Patient taking differently: 1 tablet by mouth every  day.) 30 tablet 5  . losartan (COZAAR) 50 MG tablet TAKE 1 TABLET BY MOUTH TWICE DAILY (Patient taking differently: 1 tablet by mouth twice daily.) 60 tablet 3  . metFORMIN (GLUCOPHAGE) 500 MG tablet TAKE 1 TABLET BY MOUTH TWICE DAILY TO CONTROL BLOOD SUGAR (Patient taking differently: 1 tablet by mouth twice daily to control blood sugar.) 60 tablet 2  . Multiple Vitamins-Minerals (CENTRUM SILVER PO) Take 1 tablet by mouth daily. Patient unsure of dose    . NON FORMULARY Place 3 L into the nose daily. Oxygen    . nystatin (MYCOSTATIN) 100000 UNIT/ML suspension     . potassium chloride (K-DUR,KLOR-CON) 10 MEQ tablet TAKE 1 TABLET BY MOUTH EVERY DAY (Patient taking differently: 10 MEQ (1 tablet) by mouth every day.) 30 tablet 5  . ranitidine (ZANTAC) 300 MG tablet TAKE 1 TABLET BY MOUTH EVERY NIGHT AT BEDTIME (Patient taking differently: Take 1 tablet by mouth every night at bedtime.) 30 tablet 4  . traMADol (ULTRAM) 50 MG tablet TAKE 1 TABLET BY MOUTH THREE TIMES A DAY AS NEEDED FOR PAIN 90 tablet 0  . triamterene-hydrochlorothiazide (MAXZIDE-25) 37.5-25 MG tablet TAKE 1 TABLET BY MOUTH EVERY DAY TO CONTROL BLOOD PRESSURE 90 tablet 2  . [DISCONTINUED] calcium carbonate 200 MG capsule Take 250 mg by mouth daily. Does not take everyday     No current facility-administered medications for this visit.   Facility-Administered Medications Ordered in Other Visits  Medication Dose Route Frequency Provider Last Rate Last Dose  . hyaluronate sodium (RADIAPLEXRX) gel   Topical Once Thea Silversmith, MD      . topical emolient (BIAFINE) emulsion    Topical PRN Thea Silversmith, MD        SURGICAL HISTORY:  Past Surgical History  Procedure Laterality Date  . Lung lobectomy  1986    left  . Septoplasty    . Total hip arthroplasty  01/2010    left  . Bronchoscopy  02/2011  . Dilation and curettage of uterus  1980's  . Tubal ligation  1980's  . Cardiac catheterization    . Fetal blood transfusion  09/16/11  . Joint replacement  04/13/10    Left Hip  . Cataract extraction  03/12/2013    Left Eye   . Eye surgery Left 2014    Cataract    REVIEW OF SYSTEMS:  Constitutional: positive for fatigue Eyes: negative Ears, nose, mouth, throat, and face: negative Respiratory: positive for dyspnea on exertion Cardiovascular: negative Gastrointestinal: negative Genitourinary:negative Integument/breast: negative Hematologic/lymphatic: negative Musculoskeletal:negative Neurological: negative Behavioral/Psych: negative Endocrine: negative Allergic/Immunologic: negative   PHYSICAL EXAMINATION: General appearance: alert, cooperative, fatigued and no distress Head: Normocephalic, without obvious abnormality, atraumatic Neck: no adenopathy, no JVD, supple, symmetrical, trachea midline and thyroid not enlarged, symmetric, no tenderness/mass/nodules Lymph nodes: Cervical, supraclavicular, and axillary nodes normal. Resp: clear to auscultation bilaterally Back: symmetric, no curvature. ROM normal. No CVA tenderness. Cardio: regular rate and rhythm, S1, S2 normal, no murmur, click, rub or gallop GI: soft, non-tender; bowel sounds normal; no masses,  no organomegaly Extremities: extremities normal, atraumatic, no cyanosis or edema Neurologic: Alert and oriented X 3, normal strength and tone. Normal symmetric reflexes. Normal coordination and gait  ECOG PERFORMANCE STATUS: 1 - Symptomatic but completely ambulatory  Blood pressure 158/49, pulse 102, temperature 98.9 F (37.2 C), temperature source Oral, resp. rate 18, weight 164 lb 14.4 oz  (74.798 kg), SpO2 100 %.  LABORATORY DATA: Lab Results  Component Value Date   WBC 13.0* 04/15/2015   HGB 9.9* 04/15/2015   HCT 30.4* 04/15/2015   MCV 102.7* 04/15/2015   PLT 296 04/15/2015      Chemistry      Component Value Date/Time   NA 138 04/15/2015 1455   NA 141 04/08/2015 1310   NA 142 03/12/2014 1737   NA 142 10/25/2011 1200   K 3.7 04/15/2015 1455   K 4.2 04/08/2015 1310   K 4.6 10/25/2011 1200   CL 99* 04/15/2015 1455   CL 99 07/21/2012 1125   CL 97* 10/25/2011 1200   CO2 25 04/15/2015 1455   CO2 26 04/08/2015 1310   CO2 24 10/25/2011 1200   BUN 16 04/15/2015 1455   BUN 14.1 04/08/2015 1310   BUN 15 03/12/2014 1737   BUN 15 10/25/2011 1200   CREATININE 1.06* 04/15/2015 1455   CREATININE 1.3* 04/08/2015 1310   CREATININE 0.8 10/25/2011 1200      Component Value Date/Time   CALCIUM 10.0 04/15/2015 1455   CALCIUM 10.0 04/08/2015 1310   CALCIUM 9.6 10/25/2011 1200   ALKPHOS 86 04/15/2015 1455   ALKPHOS 109 04/08/2015 1310   ALKPHOS 106* 10/25/2011 1200   AST 26 04/15/2015 1455   AST 24 04/08/2015 1310   AST 25 10/25/2011 1200   ALT 10* 04/15/2015 1455   ALT 14 04/08/2015 1310   ALT 14 10/25/2011 1200   BILITOT 0.9 04/15/2015 1455   BILITOT 0.38 04/08/2015 1310   BILITOT 0.60 10/25/2011 1200       RADIOGRAPHIC STUDIES: Dg Chest 2 View  04/15/2015  CLINICAL DATA:  Short of breath and cough for 3 days EXAM: CHEST  2 VIEW COMPARISON:  04/08/2015 FINDINGS: Stable right upper lobe collapse. Stable cavitary lesion in the right middle lobe. Stable scarring in  the left upper lobe. Normal heart size. Postoperative changes in the left hilum IMPRESSION: Chronic changes. Electronically Signed   By: Marybelle Killings M.D.   On: 04/15/2015 14:46   ASSESSMENT AND PLAN: this is a very pleasant 70 years old white female with history of stage IIB/IIIA non-small cell lung cancer status post concurrent chemoradiation followed by consolidation chemotherapy with carboplatin and  Alimta and has been observation since April 2013. She has no evidence for disease progression on his recent scan except for a continuous enlargement of the right upper lobe opacity. The molecular study for EGFR mutation was reported to be negative. The patient was started on treatment with systemic chemotherapy with carboplatin and Alimta status post 2 cycles but her treatment has been on hold secondary to increasing fatigue and weakness as well as pancytopenia. The restaging scan of the chest performed on 04/07/2016 showed mild improvement of her disease. I discussed the scan results with the patient and her daughter. I recommended for her to continue on observation for now with repeat CT scan of the chest in 3 months for restaging of her disease. If she has any evidence for disease progression in the future, I may consider the patient for treatment with immunotherapy. The patient and her daughter agreed to the current plan. She was advised to call immediately if she has any concerning symptoms in the interval. The patient voices understanding of current disease status and treatment options and is in agreement with the current care plan.  All questions were answered. The patient knows to call the clinic with any problems, questions or concerns. We can certainly see the patient much sooner if necessary.  Disclaimer: This note was dictated with voice recognition software. Similar sounding words can inadvertently be transcribed and may not be corrected upon review.

## 2015-05-15 ENCOUNTER — Other Ambulatory Visit: Payer: Self-pay

## 2015-05-15 MED ORDER — ALPRAZOLAM 1 MG PO TABS: ORAL_TABLET | ORAL | Status: DC

## 2015-05-15 MED ORDER — TRAMADOL HCL 50 MG PO TABS: ORAL_TABLET | ORAL | Status: DC

## 2015-05-19 ENCOUNTER — Telehealth: Payer: Self-pay | Admitting: Internal Medicine

## 2015-05-19 NOTE — Telephone Encounter (Signed)
Spoke with patient re appointments for 5/2 and 5/9. May 2nd lab time change to 12pm per patient. [facility] will call re ct - patient aware.

## 2015-06-04 ENCOUNTER — Encounter: Payer: Self-pay | Admitting: Internal Medicine

## 2015-06-04 ENCOUNTER — Ambulatory Visit (INDEPENDENT_AMBULATORY_CARE_PROVIDER_SITE_OTHER): Payer: Medicare Other | Admitting: Internal Medicine

## 2015-06-04 VITALS — BP 118/56 | HR 92 | Temp 98.3°F | Resp 20 | Ht 66.0 in | Wt 167.2 lb

## 2015-06-04 DIAGNOSIS — D649 Anemia, unspecified: Secondary | ICD-10-CM

## 2015-06-04 DIAGNOSIS — J449 Chronic obstructive pulmonary disease, unspecified: Secondary | ICD-10-CM

## 2015-06-04 DIAGNOSIS — K2100 Gastro-esophageal reflux disease with esophagitis, without bleeding: Secondary | ICD-10-CM

## 2015-06-04 DIAGNOSIS — I5040 Unspecified combined systolic (congestive) and diastolic (congestive) heart failure: Secondary | ICD-10-CM | POA: Diagnosis not present

## 2015-06-04 DIAGNOSIS — E1142 Type 2 diabetes mellitus with diabetic polyneuropathy: Secondary | ICD-10-CM | POA: Diagnosis not present

## 2015-06-04 MED ORDER — RANITIDINE HCL 150 MG PO TABS: ORAL_TABLET | ORAL | Status: DC

## 2015-06-04 NOTE — Patient Instructions (Addendum)
Zyrtec (cetirizine) may work better than Claritin. Available OTC.  Senokot-S is an OTC laxative and stool softener. Take 2-4 tablets nightly.

## 2015-06-04 NOTE — Progress Notes (Signed)
Patient ID: [name], female   DOB: [birth year], 70 y.o.   MRN: [id number]    Facility  [facility]    Place of Service:   OFFICE    Allergies  Allergen Reactions  . Iohexol Shortness Of Breath and Other (See Comments)    "burns me up inside"    Pt does fine with 13 hour prep  01/17/12  . Other     CAN'T STAND THE SMELL OF PURE BLEACH; "HAVE TO BACK UP AND POUR AND DON'T BREATH IT TIL i GET CAP BACK ON; DILUTE IT TO SPRAY"  . Anoro Ellipta [Umeclidinium-Vilanterol] Swelling    Swelling of lips and mouth  . Codeine Itching and Nausea And Vomiting    REACTION: GI upset  . Protonix [Pantoprazole Sodium] Diarrhea    Chief Complaint  Patient presents with  . Medical Management of Chronic Issues    Follow-up up    HPI:  Seen in ER in 04/15/15 for bronchitis. Has recovered.   Off chemo for her lung cancer due to intolerance to medications (carboplatin and Alimta): light headed, weak, dizzy, extreme fatigue.  Constipated and needing stool softener.   Using Claritin, but it is not working.  COPD: Chronic dyspnea and oxygen dependency.  Medications: Patient's Medications  New Prescriptions   No medications on file  Previous Medications   ALBUTEROL (PROVENTIL HFA;VENTOLIN HFA) 108 (90 BASE) MCG/ACT INHALER    Inhale 2 puffs into the lungs every 4 (four) hours as needed for wheezing or shortness of breath.   ALBUTEROL-IPRATROPIUM (COMBIVENT) 18-103 MCG/ACT INHALER    Inhale 2 puffs into the lungs every 4 (four) hours as needed (lung cancer).    ALPRAZOLAM (XANAX) 1 MG TABLET    TAKE 1 TABLET BY MOUTH THREE TIMES A DAY AS NEEDED FOR ANXIETY   AMBULATORY NON FORMULARY MEDICATION    Lighter Weight Rollator with Padded Seat and Hand Brakes Dx: 496, 729.1, 722.52   AMBULATORY NON FORMULARY MEDICATION    Raised Commode Seat with Rail Dx: M51.36, M54.5   ASPIRIN 81 MG TABLET    Take 81 mg by mouth daily.   CITALOPRAM (CELEXA) 20 MG TABLET    TAKE 1 TABLET BY MOUTH EVERY DAY TO HELP MOOD     COMBIVENT RESPIMAT 20-100 MCG/ACT AERS RESPIMAT    INHALE 1 PUFF BY MOUTH EVERY 6 HOURS AS NEEDED FOR SHORTNESS OF BREATH   DILTIAZEM (CARDIZEM CD) 240 MG 24 HR CAPSULE    TAKE 1 CAPSULE BY MOUTH EVERY DAY FOR BLOOD PRESSURE   FERROUS FUMARATE-FOLIC ACID (HEMATINIC/FOLIC ACID) 885-0 MG TABS    Take 1 tablet by mouth daily.   FOLIC ACID (FOLVITE) 1 MG TABLET    Take 1 tablet (1 mg total) by mouth daily.   GEMFIBROZIL (LOPID) 600 MG TABLET    TAKE 1 TABLET BY MOUTH ONCE DAILY   GLUCOSE BLOOD (FREESTYLE LITE) TEST STRIP    Use to test blood sugar three times daily. Dx: E11.9, E11.49   HYDROCODONE-ACETAMINOPHEN (NORCO) 10-325 MG TABLET    Take One tablet by mouth every 6 hours if needed for pain   HYDROCODONE-HOMATROPINE (HYCODAN) 5-1.5 MG/5ML SYRUP    Take 5 mLs by mouth every 6 (six) hours as needed for cough.   LANCETS (FREESTYLE) LANCETS    Use to test blood sugar three times daily. Dx: E11.9, E11.49   LEVOFLOXACIN (LEVAQUIN) 500 MG TABLET    Take 1 tablet (500 mg total) by mouth daily.   LORATADINE (CLARITIN) 10  MG TABLET    TAKE 1 TABLET BY MOUTH EVERY DAY   LOSARTAN (COZAAR) 50 MG TABLET    TAKE 1 TABLET BY MOUTH TWICE DAILY   METFORMIN (GLUCOPHAGE) 500 MG TABLET    TAKE 1 TABLET BY MOUTH TWICE DAILY TO CONTROL BLOOD SUGAR   MULTIPLE VITAMINS-MINERALS (CENTRUM SILVER PO)    Take 1 tablet by mouth daily. Patient unsure of dose   NON FORMULARY    Place 3 L into the nose daily. Oxygen   NYSTATIN (MYCOSTATIN) 100000 UNIT/ML SUSPENSION       POTASSIUM CHLORIDE (K-DUR,KLOR-CON) 10 MEQ TABLET    TAKE 1 TABLET BY MOUTH EVERY DAY   RANITIDINE (ZANTAC) 300 MG TABLET    TAKE 1 TABLET BY MOUTH EVERY NIGHT AT BEDTIME   TRAMADOL (ULTRAM) 50 MG TABLET    TAKE 1 TABLET BY MOUTH THREE TIMES A DAY AS NEEDED FOR PAIN   TRIAMTERENE-HYDROCHLOROTHIAZIDE (MAXZIDE-25) 37.5-25 MG TABLET    TAKE 1 TABLET BY MOUTH EVERY DAY TO CONTROL BLOOD PRESSURE  Modified Medications   No medications on file  Discontinued  Medications   GLUCOSE BLOOD TEST STRIP    Check blood sugar once daily  DX: 250.00    Review of Systems  Constitutional: Positive for activity change. Negative for fever and chills.  HENT: Positive for postnasal drip, sinus pressure and trouble swallowing. Negative for congestion, ear pain and sore throat.        Popping noise in ears  Eyes: Negative.   Respiratory: Positive for cough and shortness of breath. Negative for choking, chest tightness, wheezing and stridor.   Cardiovascular: Negative for chest pain and palpitations.  Gastrointestinal: Positive for constipation. Negative for nausea, abdominal pain, diarrhea and abdominal distention.       Reflux frequently.  Musculoskeletal: Positive for myalgias, back pain, arthralgias, gait problem and neck pain.  Skin: Negative.   Neurological: Positive for dizziness, weakness (generalized), light-headedness and headaches. Negative for tremors and syncope.  Hematological:       Chronic anemia  Psychiatric/Behavioral: Positive for sleep disturbance and dysphoric mood. Negative for hallucinations. The patient is not nervous/anxious and is not hyperactive.     Filed Vitals:   06/04/15 1518  BP: 118/56  Pulse: 92  Temp: 98.3 F (36.8 C)  TempSrc: Oral  Resp: 20  Height: 5' 6"  (1.676 m)  Weight: 167 lb 3.2 oz (75.841 kg)  SpO2: 95%   Body mass index is 27 kg/(m^2). Filed Weights   06/04/15 1518  Weight: 167 lb 3.2 oz (75.841 kg)     Physical Exam  Constitutional: She is oriented to person, place, and time.  overweight  HENT:  Nose: Nose normal.  Mouth/Throat: Oropharynx is clear and moist. No oropharyngeal exudate.  Loss of hearing.  Eyes: Conjunctivae and EOM are normal. Pupils are equal, round, and reactive to light.  Neck: No JVD present. No tracheal deviation present. No thyromegaly present.  Cardiovascular: Normal rate, regular rhythm and normal heart sounds.  Exam reveals no gallop and no friction rub.   No murmur  heard. Pulmonary/Chest: No respiratory distress. She has no wheezes. She has no rales. She exhibits no tenderness.  Abdominal: She exhibits no distension and no mass. There is no tenderness.  Musculoskeletal: Normal range of motion. She exhibits edema and tenderness.  Lymphadenopathy:    She has no cervical adenopathy.  Neurological: She is oriented to person, place, and time. No cranial nerve deficit. Coordination normal.  Skin: No rash noted. No erythema. No  pallor.  Psychiatric: Her behavior is normal. Judgment and thought content normal.    Labs reviewed: Lab Summary Latest Ref Rng 04/15/2015 04/08/2015 03/27/2015  Hemoglobin 12.0 - 15.0 g/dL 9.9(L) 10.0(L) 10.6(L)  Hematocrit 36.0 - 46.0 % 30.4(L) 30.0(L) 32.2(L)  White count 4.0 - 10.5 K/uL 13.0(H) 8.3 8.7  Platelet count 150 - 400 K/uL 296 267 274  Sodium 135 - 145 mmol/L 138 141 138  Potassium 3.5 - 5.1 mmol/L 3.7 4.2 4.1  Calcium 8.9 - 10.3 mg/dL 10.0 10.0 10.1  Phosphorus - (None) (None) (None)  Creatinine 0.44 - 1.00 mg/dL 1.06(H) 1.3(H) 1.4(H)  AST 15 - 41 U/L 26 24 29   Alk Phos 38 - 126 U/L 86 109 104  Bilirubin 0.3 - 1.2 mg/dL 0.9 0.38 0.38  Glucose 65 - 99 mg/dL 169(H) 97 165(H)  Cholesterol - (None) (None) (None)  HDL cholesterol - (None) (None) (None)  Triglycerides - (None) (None) (None)  LDL Direct - (None) (None) (None)  LDL Calc - (None) (None) (None)  Total protein 6.5 - 8.1 g/dL 7.9 6.7 7.4  Albumin 3.5 - 5.0 g/dL 4.1 3.6 3.9   Lab Results  Component Value Date   TSH 0.86 08/07/2009   Lab Results  Component Value Date   BUN 16 04/15/2015   BUN 14.1 04/08/2015   BUN 11.6 03/27/2015   Lab Results  Component Value Date   HGBA1C 5.7* 12/18/2013   HGBA1C 5.1 09/15/2011   HGBA1C 5.8 08/07/2009    Assessment/Plan  1. Chronic obstructive pulmonary disease, unspecified COPD type (HCC) Unchanged. Oxygen dependent. Severe bronchitis in January, but recovering now.  2. Anemia, unspecified anemia  type Follow-up by Dr. [name]. Remains on iron.  3. DM type 2 with diabetic peripheral neuropathy (HCC) Adequately controlled  4. Combined systolic and diastolic congestive heart failure, unspecified congestive heart failure chronicity ([place]) Compensated  5. Gastroesophageal reflux disease with esophagitis Increased ranitidine dosing - ranitidine (ZANTAC) 150 MG tablet; One twice daily to reduce stomach acid  Dispense: 60 tablet; Refill: 3

## 2015-06-10 ENCOUNTER — Other Ambulatory Visit: Payer: Self-pay | Admitting: *Deleted

## 2015-06-10 ENCOUNTER — Other Ambulatory Visit: Payer: Self-pay | Admitting: Internal Medicine

## 2015-06-10 DIAGNOSIS — M5136 Other intervertebral disc degeneration, lumbar region: Secondary | ICD-10-CM

## 2015-06-10 DIAGNOSIS — G44229 Chronic tension-type headache, not intractable: Secondary | ICD-10-CM

## 2015-06-10 DIAGNOSIS — G8929 Other chronic pain: Secondary | ICD-10-CM

## 2015-06-10 DIAGNOSIS — M51369 Other intervertebral disc degeneration, lumbar region without mention of lumbar back pain or lower extremity pain: Secondary | ICD-10-CM

## 2015-06-10 DIAGNOSIS — M25552 Pain in left hip: Secondary | ICD-10-CM

## 2015-06-10 MED ORDER — FREESTYLE LANCETS MISC: Status: DC

## 2015-06-10 MED ORDER — GLUCOSE BLOOD VI STRP: ORAL_STRIP | Status: DC

## 2015-06-10 MED ORDER — HYDROCODONE-ACETAMINOPHEN 10-325 MG PO TABS: ORAL_TABLET | ORAL | Status: DC

## 2015-06-10 NOTE — Telephone Encounter (Signed)
[facility]

## 2015-06-10 NOTE — Telephone Encounter (Signed)
Patient requested and will pick up 

## 2015-06-12 ENCOUNTER — Other Ambulatory Visit: Payer: Self-pay | Admitting: Internal Medicine

## 2015-06-12 ENCOUNTER — Other Ambulatory Visit: Payer: Self-pay | Admitting: Emergency Medicine

## 2015-06-16 NOTE — Telephone Encounter (Signed)
rx sent to pharmacy by e-script  

## 2015-06-16 NOTE — Telephone Encounter (Signed)
rx called into pharmacy

## 2015-07-07 ENCOUNTER — Telehealth: Payer: Self-pay

## 2015-07-07 DIAGNOSIS — G44229 Chronic tension-type headache, not intractable: Secondary | ICD-10-CM

## 2015-07-07 DIAGNOSIS — M5136 Other intervertebral disc degeneration, lumbar region: Secondary | ICD-10-CM

## 2015-07-07 DIAGNOSIS — M25552 Pain in left hip: Secondary | ICD-10-CM

## 2015-07-07 DIAGNOSIS — G8929 Other chronic pain: Secondary | ICD-10-CM

## 2015-07-07 MED ORDER — HYDROCODONE-ACETAMINOPHEN 10-325 MG PO TABS: ORAL_TABLET | ORAL | Status: DC

## 2015-07-07 NOTE — Telephone Encounter (Signed)
Patient called, needs refill on her Hydrocodone. Her aid Laurel will pick it up tomorrow.

## 2015-07-08 ENCOUNTER — Other Ambulatory Visit: Payer: Self-pay | Admitting: Internal Medicine

## 2015-07-14 ENCOUNTER — Other Ambulatory Visit: Payer: Self-pay | Admitting: Internal Medicine

## 2015-07-14 DIAGNOSIS — C3491 Malignant neoplasm of unspecified part of right bronchus or lung: Secondary | ICD-10-CM

## 2015-07-14 MED ORDER — PREDNISONE 20 MG PO TABS: ORAL_TABLET | ORAL | Status: DC

## 2015-07-16 ENCOUNTER — Ambulatory Visit: Payer: Medicare Other | Admitting: Internal Medicine

## 2015-07-22 ENCOUNTER — Ambulatory Visit: Payer: Medicare Other | Admitting: Internal Medicine

## 2015-07-29 ENCOUNTER — Ambulatory Visit (INDEPENDENT_AMBULATORY_CARE_PROVIDER_SITE_OTHER): Payer: Medicare Other | Admitting: Internal Medicine

## 2015-07-29 ENCOUNTER — Encounter: Payer: Self-pay | Admitting: Internal Medicine

## 2015-07-29 VITALS — BP 138/64 | HR 79 | Temp 98.4°F | Ht 66.0 in | Wt 167.0 lb

## 2015-07-29 DIAGNOSIS — K2100 Gastro-esophageal reflux disease with esophagitis, without bleeding: Secondary | ICD-10-CM

## 2015-07-29 DIAGNOSIS — R300 Dysuria: Secondary | ICD-10-CM | POA: Insufficient documentation

## 2015-07-29 DIAGNOSIS — J449 Chronic obstructive pulmonary disease, unspecified: Secondary | ICD-10-CM

## 2015-07-29 DIAGNOSIS — F411 Generalized anxiety disorder: Secondary | ICD-10-CM | POA: Diagnosis not present

## 2015-07-29 DIAGNOSIS — F32A Depression, unspecified: Secondary | ICD-10-CM | POA: Insufficient documentation

## 2015-07-29 DIAGNOSIS — E1142 Type 2 diabetes mellitus with diabetic polyneuropathy: Secondary | ICD-10-CM

## 2015-07-29 DIAGNOSIS — D649 Anemia, unspecified: Secondary | ICD-10-CM

## 2015-07-29 DIAGNOSIS — F419 Anxiety disorder, unspecified: Secondary | ICD-10-CM

## 2015-07-29 DIAGNOSIS — F329 Major depressive disorder, single episode, unspecified: Secondary | ICD-10-CM | POA: Insufficient documentation

## 2015-07-29 DIAGNOSIS — C3411 Malignant neoplasm of upper lobe, right bronchus or lung: Secondary | ICD-10-CM | POA: Diagnosis not present

## 2015-07-29 DIAGNOSIS — I5040 Unspecified combined systolic (congestive) and diastolic (congestive) heart failure: Secondary | ICD-10-CM | POA: Diagnosis not present

## 2015-07-29 MED ORDER — DIAZEPAM 5 MG PO TABS: 5.0000 mg | ORAL_TABLET | Freq: Two times a day (BID) | ORAL | Status: DC | PRN

## 2015-07-29 MED ORDER — AMOXICILLIN 500 MG PO CAPS: ORAL_CAPSULE | ORAL | Status: DC

## 2015-07-29 NOTE — Progress Notes (Signed)
Patient ID: [name], female   DOB: Sep 08, 1945, 70 y.o.   MRN: [id number]    Facility  [facility]    Place of Service:   OFFICE    Allergies  Allergen Reactions  . Iohexol Shortness Of Breath and Other (See Comments)    "burns me up inside"    Pt does fine with 13 hour prep  01/17/12  . Other     CAN'T STAND THE SMELL OF PURE BLEACH; "HAVE TO BACK UP AND POUR AND DON'T BREATH IT TIL i GET CAP BACK ON; DILUTE IT TO SPRAY"  . Anoro Ellipta [Umeclidinium-Vilanterol] Swelling    Swelling of lips and mouth  . Codeine Itching and Nausea And Vomiting    REACTION: GI upset  . Protonix [Pantoprazole Sodium] Diarrhea    Chief Complaint  Patient presents with  . Abdominal Pain    still having trouble, food slides down throat without her swallowing    HPI:  Coughing up green stuff.  Pain in the left hip. S/P hip replacement.  Hx of GERD. Using unidentified medication 4 times daily.  Continues to see DR. Mohamed and Oncology. Last seen 05/14/15.  Skin itches.  Hurting in the epigastric area.  Medications: Patient's Medications  New Prescriptions   No medications on file  Previous Medications   ALBUTEROL (PROVENTIL HFA;VENTOLIN HFA) 108 (90 BASE) MCG/ACT INHALER    Inhale 2 puffs into the lungs every 4 (four) hours as needed for wheezing or shortness of breath.   ALBUTEROL-IPRATROPIUM (COMBIVENT) 18-103 MCG/ACT INHALER    Inhale 2 puffs into the lungs every 4 (four) hours as needed (lung cancer).    ALPRAZOLAM (XANAX) 1 MG TABLET    TAKE 1 TABLET BY MOUTH THREE TIMES A DAY AS NEEDED FOR ANXIETY   AMBULATORY NON FORMULARY MEDICATION    Lighter Weight Rollator with Padded Seat and Hand Brakes Dx: 496, 729.1, 722.52   AMBULATORY NON FORMULARY MEDICATION    Raised Commode Seat with Rail Dx: M51.36, M54.5   ASPIRIN 81 MG TABLET    Take 81 mg by mouth daily.   CITALOPRAM (CELEXA) 20 MG TABLET    TAKE 1 TABLET BY MOUTH EVERY DAY TO HELP MOOD   COMBIVENT RESPIMAT 20-100 MCG/ACT AERS  RESPIMAT    INHALE 1 PUFF BY MOUTH EVERY 6 HOURS AS NEEDED FOR SHORTNESS OF BREATH   DILTIAZEM (CARDIZEM CD) 240 MG 24 HR CAPSULE    TAKE 1 CAPSULE BY MOUTH EVERY DAY FOR BLOOD PRESSURE.   DILTIAZEM (CARDIZEM CD) 240 MG 24 HR CAPSULE    TAKE 1 CAPSULE BY MOUTH EVERY DAY FOR BLOOD PRESSURE.   FERROUS FUMARATE-FOLIC ACID (HEMATINIC/FOLIC ACID) 505-6 MG TABS    Take 1 tablet by mouth daily.   FLUTICASONE (FLONASE) 50 MCG/ACT NASAL SPRAY    INSTILL 2 SPRAYS INTO BOTH NOSTRILS  TWICE DAILY *NEEDS APPOINTMENT*   FLUTICASONE (FLONASE) 50 MCG/ACT NASAL SPRAY    INSTILL 2 SPRAYS INTO BOTH NOSTRILS  TWICE DAILY *NEEDS APPOINTMENT*   FOLIC ACID (FOLVITE) 1 MG TABLET    Take 1 tablet (1 mg total) by mouth daily.   GEMFIBROZIL (LOPID) 600 MG TABLET    TAKE 1 TABLET BY MOUTH ONCE DAILY   GLUCOSE BLOOD (FREESTYLE LITE) TEST STRIP    Use to test blood sugar three times daily. Dx: E11.9, E11.49   HYDROCODONE-ACETAMINOPHEN (NORCO) 10-325 MG TABLET    Take One tablet by mouth every 6 hours if needed for pain   HYDROCODONE-HOMATROPINE (HYCODAN) 5-1.5  MG/5ML SYRUP    Take 5 mLs by mouth every 6 (six) hours as needed for cough.   LANCETS (FREESTYLE) LANCETS    Use to test blood sugar three times daily. Dx: E11.9, E11.49   LORATADINE (CLARITIN) 10 MG TABLET    TAKE 1 TABLET BY MOUTH EVERY DAY   LOSARTAN (COZAAR) 50 MG TABLET    TAKE 1 TABLET BY MOUTH TWICE DAILY   METFORMIN (GLUCOPHAGE) 500 MG TABLET    TAKE 1 TABLET BY MOUTH TWICE DAILY TO CONTROL BLOOD SUGAR.   MULTIPLE VITAMINS-MINERALS (CENTRUM SILVER PO)    Take 1 tablet by mouth daily. Patient unsure of dose   NON FORMULARY    Place 3 L into the nose daily. Oxygen   NYSTATIN (MYCOSTATIN) 100000 UNIT/ML SUSPENSION       POTASSIUM CHLORIDE (K-DUR,KLOR-CON) 10 MEQ TABLET    TAKE 1 TABLET BY MOUTH EVERY DAY   RANITIDINE (ZANTAC) 300 MG TABLET    TAKE 1 TABLET BY MOUTH EVERY NIGHT AT BEDTIME   TRAMADOL (ULTRAM) 50 MG TABLET    TAKE 1 TABLET BY MOUTH THREE TIMES A DAY  AS NEEDED FOR PAIN   TRIAMTERENE-HYDROCHLOROTHIAZIDE (MAXZIDE-25) 37.5-25 MG TABLET    TAKE 1 TABLET BY MOUTH EVERY DAY TO CONTROL BLOOD PRESSURE  Modified Medications   No medications on file  Discontinued Medications   GEMFIBROZIL (LOPID) 600 MG TABLET    TAKE 1 TABLET BY MOUTH ONCE DAILY   LORATADINE (CLARITIN) 10 MG TABLET    TAKE 1 TABLET BY MOUTH EVERY DAY   METFORMIN (GLUCOPHAGE) 500 MG TABLET    TAKE 1 TABLET BY MOUTH TWICE DAILY TO CONTROL BLOOD SUGAR.   PREDNISONE (DELTASONE) 20 MG TABLET    Take 2 1/2 tablets 13 hours prior to CT SCAN then  2 1/2 tabs 7 hours prior to ct scan, and then 2 1/2 tabs 1 hour prior to CT scan.   RANITIDINE (ZANTAC) 300 MG TABLET    TAKE 1 TABLET BY MOUTH EVERY NIGHT AT BEDTIME    Review of Systems  Constitutional: Positive for activity change. Negative for fever and chills.  HENT: Positive for postnasal drip, sinus pressure and trouble swallowing. Negative for congestion, ear pain and sore throat.        Popping noise in ears  Eyes: Negative.   Respiratory: Positive for cough and shortness of breath. Negative for choking, chest tightness, wheezing and stridor.        Lung cancer lung cancer  Cardiovascular: Negative for chest pain and palpitations.  Gastrointestinal: Positive for constipation. Negative for nausea, abdominal pain, diarrhea and abdominal distention.       Reflux frequently.  Musculoskeletal: Positive for myalgias, back pain, arthralgias, gait problem and neck pain.       Left hip pain  Skin: Negative.   Neurological: Positive for dizziness, weakness (generalized), light-headedness and headaches. Negative for tremors and syncope.  Hematological:       Chronic anemia  Psychiatric/Behavioral: Positive for sleep disturbance and dysphoric mood. Negative for hallucinations. The patient is not nervous/anxious and is not hyperactive.     Filed Vitals:   07/29/15 1656  BP: 138/64  Pulse: 79  Temp: 98.4 F (36.9 C)  TempSrc: Oral    Height: 5' 6"  (1.676 m)  Weight: 167 lb (75.751 kg)  SpO2: 98%   Body mass index is 26.97 kg/(m^2). Filed Weights   07/29/15 1656  Weight: 167 lb (75.751 kg)     Physical Exam  Constitutional: She  is oriented to person, place, and time.  overweight  HENT:  Nose: Nose normal.  Mouth/Throat: Oropharynx is clear and moist. No oropharyngeal exudate.  Loss of hearing.  Eyes: Conjunctivae and EOM are normal. Pupils are equal, round, and reactive to light.  Neck: No JVD present. No tracheal deviation present. No thyromegaly present.  Cardiovascular: Normal rate, regular rhythm and normal heart sounds.  Exam reveals no gallop and no friction rub.   No murmur heard. Pulmonary/Chest: No respiratory distress. She has no wheezes. She has no rales. She exhibits no tenderness.  Abdominal: She exhibits no distension and no mass. There is no tenderness.  Musculoskeletal: Normal range of motion. She exhibits edema and tenderness.  Lymphadenopathy:    She has no cervical adenopathy.  Neurological: She is oriented to person, place, and time. No cranial nerve deficit. Coordination normal.  Skin: No rash noted. No erythema. No pallor.  Psychiatric: Her behavior is normal. Judgment and thought content normal.    Labs reviewed: Lab Summary Latest Ref Rng 04/15/2015 04/08/2015 03/27/2015  Hemoglobin 12.0 - 15.0 g/dL 9.9(L) 10.0(L) 10.6(L)  Hematocrit 36.0 - 46.0 % 30.4(L) 30.0(L) 32.2(L)  White count 4.0 - 10.5 K/uL 13.0(H) 8.3 8.7  Platelet count 150 - 400 K/uL 296 267 274  Sodium 135 - 145 mmol/L 138 141 138  Potassium 3.5 - 5.1 mmol/L 3.7 4.2 4.1  Calcium 8.9 - 10.3 mg/dL 10.0 10.0 10.1  Phosphorus - (None) (None) (None)  Creatinine 0.44 - 1.00 mg/dL 1.06(H) 1.3(H) 1.4(H)  AST 15 - 41 U/L 26 24 29   Alk Phos 38 - 126 U/L 86 109 104  Bilirubin 0.3 - 1.2 mg/dL 0.9 0.38 0.38  Glucose 65 - 99 mg/dL 169(H) 97 165(H)  Cholesterol - (None) (None) (None)  HDL cholesterol - (None) (None) (None)   Triglycerides - (None) (None) (None)  LDL Direct - (None) (None) (None)  LDL Calc - (None) (None) (None)  Total protein 6.5 - 8.1 g/dL 7.9 6.7 7.4  Albumin 3.5 - 5.0 g/dL 4.1 3.6 3.9   Lab Results  Component Value Date   TSH 0.86 08/07/2009   Lab Results  Component Value Date   BUN 16 04/15/2015   BUN 14.1 04/08/2015   BUN 11.6 03/27/2015   Lab Results  Component Value Date   HGBA1C 5.7* 12/18/2013   HGBA1C 5.1 09/15/2011   HGBA1C 5.8 08/07/2009    Assessment/Plan  1. Chronic obstructive pulmonary disease, unspecified COPD type (HCC) - amoxicillin (AMOXIL) 500 MG capsule; Take capsule 3 times daily for infection  Dispense: 30 capsule; Refill: 0  2. DM type 2 with diabetic peripheral neuropathy (HCC) - Hemoglobin A1c; Future - Microalbumin, urine; Future - Comprehensive metabolic panel; Future  3. Anemia, unspecified anemia type - CBC With Differential; Future  4. Combined systolic and diastolic congestive heart failure, unspecified congestive heart failure chronicity ([place]) Compensated at this time  5. Gastroesophageal reflux disease with esophagitis Persistent heartburn and reflux  6 . Primary cancer of right upper lobe of lung ([place]) -Continue with oncology as scheduled   7. Dysuria - Urinalysis - Urine culture  8. Anxiety state - diazepam (VALIUM) 5 MG tablet; Take 1 tablet (5 mg total) by mouth every 12 (twelve) hours as needed for anxiety.  Dispense: 60 tablet; Refill: 1

## 2015-07-30 ENCOUNTER — Other Ambulatory Visit: Payer: Self-pay

## 2015-08-01 ENCOUNTER — Telehealth: Payer: Self-pay

## 2015-08-01 MED ORDER — CIPROFLOXACIN HCL 500 MG PO TABS: 500.0000 mg | ORAL_TABLET | Freq: Two times a day (BID) | ORAL | Status: DC

## 2015-08-01 NOTE — Telephone Encounter (Signed)
Discussed with patient, patient verbalized understanding of medication change

## 2015-08-01 NOTE — Telephone Encounter (Signed)
Message left on triage voicemail. Patient was rx'ed amoxicillin 500 mg TID (patient only taking BID). Medication is causing severe diarrhea. Please advise   Last OV 07-29-15

## 2015-08-01 NOTE — Telephone Encounter (Signed)
Stop amoxicillin. Call in a new prescription for Cipro 500 mg (20 tablets) one twice daily for infection.

## 2015-08-04 ENCOUNTER — Other Ambulatory Visit: Payer: Medicare Other

## 2015-08-06 ENCOUNTER — Other Ambulatory Visit: Payer: Self-pay | Admitting: *Deleted

## 2015-08-06 ENCOUNTER — Other Ambulatory Visit: Payer: Medicare Other

## 2015-08-06 DIAGNOSIS — M5136 Other intervertebral disc degeneration, lumbar region: Secondary | ICD-10-CM

## 2015-08-06 DIAGNOSIS — E1142 Type 2 diabetes mellitus with diabetic polyneuropathy: Secondary | ICD-10-CM

## 2015-08-06 DIAGNOSIS — G8929 Other chronic pain: Secondary | ICD-10-CM

## 2015-08-06 DIAGNOSIS — D649 Anemia, unspecified: Secondary | ICD-10-CM

## 2015-08-06 DIAGNOSIS — G44229 Chronic tension-type headache, not intractable: Secondary | ICD-10-CM

## 2015-08-06 DIAGNOSIS — M25552 Pain in left hip: Secondary | ICD-10-CM

## 2015-08-06 MED ORDER — HYDROCODONE-ACETAMINOPHEN 10-325 MG PO TABS: ORAL_TABLET | ORAL | Status: DC

## 2015-08-06 NOTE — Telephone Encounter (Signed)
Patient requested and will pick up 

## 2015-08-07 LAB — COMPREHENSIVE METABOLIC PANEL
ALK PHOS: 118 IU/L — AB (ref 39–117)
ALT: 10 IU/L (ref 0–32)
AST: 22 IU/L (ref 0–40)
Albumin/Globulin Ratio: 2 (ref 1.2–2.2)
Albumin: 4.4 g/dL (ref 3.5–4.8)
BILIRUBIN TOTAL: 0.3 mg/dL (ref 0.0–1.2)
BUN / CREAT RATIO: 15 (ref 12–28)
BUN: 19 mg/dL (ref 8–27)
CHLORIDE: 97 mmol/L (ref 96–106)
CO2: 23 mmol/L (ref 18–29)
Calcium: 9.9 mg/dL (ref 8.7–10.3)
Creatinine, Ser: 1.29 mg/dL — ABNORMAL HIGH (ref 0.57–1.00)
GFR calc Af Amer: 48 mL/min/{1.73_m2} — ABNORMAL LOW (ref >59)
GFR calc non Af Amer: 42 mL/min/{1.73_m2} — ABNORMAL LOW (ref >59)
GLOBULIN, TOTAL: 2.2 g/dL (ref 1.5–4.5)
GLUCOSE: 82 mg/dL (ref 65–99)
Potassium: 3.9 mmol/L (ref 3.5–5.2)
SODIUM: 139 mmol/L (ref 134–144)
Total Protein: 6.6 g/dL (ref 6.0–8.5)

## 2015-08-07 LAB — MICROALBUMIN, URINE: Microalbumin, Urine: 93 ug/mL

## 2015-08-07 LAB — CBC WITH DIFFERENTIAL
BASOS ABS: 0 10*3/uL (ref 0.0–0.2)
BASOS: 1 %
EOS (ABSOLUTE): 0.4 10*3/uL (ref 0.0–0.4)
Eos: 5 %
Hematocrit: 32.7 % — ABNORMAL LOW (ref 34.0–46.6)
Hemoglobin: 11.1 g/dL (ref 11.1–15.9)
IMMATURE GRANS (ABS): 0 10*3/uL (ref 0.0–0.1)
Immature Granulocytes: 0 %
LYMPHS: 34 %
Lymphocytes Absolute: 2.9 10*3/uL (ref 0.7–3.1)
MCH: 30.7 pg (ref 26.6–33.0)
MCHC: 33.9 g/dL (ref 31.5–35.7)
MCV: 91 fL (ref 79–97)
MONOCYTES: 9 %
Monocytes Absolute: 0.8 10*3/uL (ref 0.1–0.9)
NEUTROS ABS: 4.5 10*3/uL (ref 1.4–7.0)
NEUTROS PCT: 51 %
RBC: 3.61 x10E6/uL — AB (ref 3.77–5.28)
RDW: 12.5 % (ref 12.3–15.4)
WBC: 8.7 10*3/uL (ref 3.4–10.8)

## 2015-08-07 LAB — HEMOGLOBIN A1C
Est. average glucose Bld gHb Est-mCnc: 111 mg/dL
HEMOGLOBIN A1C: 5.5 % (ref 4.8–5.6)

## 2015-08-12 ENCOUNTER — Encounter (HOSPITAL_COMMUNITY): Payer: Self-pay

## 2015-08-12 ENCOUNTER — Ambulatory Visit (HOSPITAL_COMMUNITY)
Admission: RE | Admit: 2015-08-12 | Discharge: 2015-08-12 | Disposition: A | Discharge status: Home / Self Care | Payer: Medicare Other | Source: Ambulatory Visit | Attending: Internal Medicine | Admitting: Internal Medicine

## 2015-08-12 ENCOUNTER — Other Ambulatory Visit (HOSPITAL_BASED_OUTPATIENT_CLINIC_OR_DEPARTMENT_OTHER): Payer: Medicare Other

## 2015-08-12 ENCOUNTER — Telehealth: Payer: Self-pay | Admitting: *Deleted

## 2015-08-12 DIAGNOSIS — S2241XG Multiple fractures of ribs, right side, subsequent encounter for fracture with delayed healing: Secondary | ICD-10-CM | POA: Insufficient documentation

## 2015-08-12 DIAGNOSIS — Z923 Personal history of irradiation: Secondary | ICD-10-CM | POA: Diagnosis not present

## 2015-08-12 DIAGNOSIS — J90 Pleural effusion, not elsewhere classified: Secondary | ICD-10-CM | POA: Diagnosis not present

## 2015-08-12 DIAGNOSIS — C3411 Malignant neoplasm of upper lobe, right bronchus or lung: Secondary | ICD-10-CM

## 2015-08-12 DIAGNOSIS — R918 Other nonspecific abnormal finding of lung field: Secondary | ICD-10-CM | POA: Insufficient documentation

## 2015-08-12 DIAGNOSIS — J701 Chronic and other pulmonary manifestations due to radiation: Secondary | ICD-10-CM | POA: Insufficient documentation

## 2015-08-12 DIAGNOSIS — D3501 Benign neoplasm of right adrenal gland: Secondary | ICD-10-CM | POA: Insufficient documentation

## 2015-08-12 DIAGNOSIS — D3502 Benign neoplasm of left adrenal gland: Secondary | ICD-10-CM | POA: Insufficient documentation

## 2015-08-12 DIAGNOSIS — J439 Emphysema, unspecified: Secondary | ICD-10-CM | POA: Insufficient documentation

## 2015-08-12 DIAGNOSIS — I2510 Atherosclerotic heart disease of native coronary artery without angina pectoris: Secondary | ICD-10-CM | POA: Insufficient documentation

## 2015-08-12 DIAGNOSIS — J449 Chronic obstructive pulmonary disease, unspecified: Secondary | ICD-10-CM

## 2015-08-12 LAB — CBC WITH DIFFERENTIAL/PLATELET
BASO%: 0.3 % (ref 0.0–2.0)
BASOS ABS: 0 10*3/uL (ref 0.0–0.1)
EOS%: 0 % (ref 0.0–7.0)
Eosinophils Absolute: 0 10*3/uL (ref 0.0–0.5)
HEMATOCRIT: 33.9 % — AB (ref 34.8–46.6)
HEMOGLOBIN: 11.1 g/dL — AB (ref 11.6–15.9)
LYMPH#: 0.8 10*3/uL — AB (ref 0.9–3.3)
LYMPH%: 6.5 % — ABNORMAL LOW (ref 14.0–49.7)
MCH: 30.4 pg (ref 25.1–34.0)
MCHC: 32.7 g/dL (ref 31.5–36.0)
MCV: 92.8 fL (ref 79.5–101.0)
MONO#: 0.1 10*3/uL (ref 0.1–0.9)
MONO%: 0.5 % (ref 0.0–14.0)
NEUT#: 11.1 10*3/uL — ABNORMAL HIGH (ref 1.5–6.5)
NEUT%: 92.7 % — AB (ref 38.4–76.8)
PLATELETS: 298 10*3/uL (ref 145–400)
RBC: 3.65 10*6/uL — ABNORMAL LOW (ref 3.70–5.45)
RDW: 12.1 % (ref 11.2–14.5)
WBC: 12 10*3/uL — ABNORMAL HIGH (ref 3.9–10.3)

## 2015-08-12 LAB — COMPREHENSIVE METABOLIC PANEL
ALK PHOS: 100 U/L (ref 40–150)
ALT: 14 U/L (ref 0–55)
ANION GAP: 12 meq/L — AB (ref 3–11)
AST: 17 U/L (ref 5–34)
Albumin: 4 g/dL (ref 3.5–5.0)
BUN: 25.4 mg/dL (ref 7.0–26.0)
CALCIUM: 10.1 mg/dL (ref 8.4–10.4)
CO2: 23 mEq/L (ref 22–29)
CREATININE: 1.4 mg/dL — AB (ref 0.6–1.1)
Chloride: 102 mEq/L (ref 98–109)
EGFR: 36 mL/min/{1.73_m2} — ABNORMAL LOW (ref >90)
Glucose: 156 mg/dl — ABNORMAL HIGH (ref 70–140)
Potassium: 4.4 mEq/L (ref 3.5–5.1)
Sodium: 137 mEq/L (ref 136–145)
Total Protein: 7.3 g/dL (ref 6.4–8.3)

## 2015-08-12 MED ORDER — IOPAMIDOL (ISOVUE-300) INJECTION 61%
75.0000 mL | Freq: Once | INTRAVENOUS | Status: AC | PRN
  Administered 2015-08-12: 50 mL via INTRAVENOUS

## 2015-08-12 NOTE — Telephone Encounter (Signed)
Patient called regarding her lab results, I returned her call to inform her that Dr. Nyoka Cowden had not read them yet and we will get back with her before 5 pm this evening.

## 2015-08-13 ENCOUNTER — Telehealth: Payer: Self-pay | Admitting: *Deleted

## 2015-08-13 NOTE — Telephone Encounter (Signed)
Spoke with patient regarding her labs , I informed her that these labs were requested by [name]. Dr. Nyoka Cowden did look over them and stated that they look about the same.I also told that [name] will speak with her more about these on her next appointment.

## 2015-08-19 ENCOUNTER — Telehealth: Payer: Self-pay | Admitting: Internal Medicine

## 2015-08-19 ENCOUNTER — Encounter: Payer: Self-pay | Admitting: Internal Medicine

## 2015-08-19 ENCOUNTER — Ambulatory Visit (HOSPITAL_BASED_OUTPATIENT_CLINIC_OR_DEPARTMENT_OTHER): Payer: Medicare Other | Admitting: Internal Medicine

## 2015-08-19 VITALS — BP 105/75 | HR 95 | Temp 99.3°F | Resp 18 | Ht 66.0 in | Wt 166.9 lb

## 2015-08-19 DIAGNOSIS — D61818 Other pancytopenia: Secondary | ICD-10-CM

## 2015-08-19 DIAGNOSIS — C3411 Malignant neoplasm of upper lobe, right bronchus or lung: Secondary | ICD-10-CM | POA: Diagnosis not present

## 2015-08-19 DIAGNOSIS — R1013 Epigastric pain: Secondary | ICD-10-CM

## 2015-08-19 DIAGNOSIS — R5383 Other fatigue: Secondary | ICD-10-CM

## 2015-08-19 DIAGNOSIS — K219 Gastro-esophageal reflux disease without esophagitis: Secondary | ICD-10-CM

## 2015-08-19 DIAGNOSIS — Z515 Encounter for palliative care: Secondary | ICD-10-CM | POA: Insufficient documentation

## 2015-08-19 DIAGNOSIS — R5382 Chronic fatigue, unspecified: Secondary | ICD-10-CM

## 2015-08-19 DIAGNOSIS — Z5112 Encounter for antineoplastic immunotherapy: Secondary | ICD-10-CM

## 2015-08-19 HISTORY — DX: Chronic fatigue, unspecified: R53.82

## 2015-08-19 NOTE — Telephone Encounter (Deleted)
Pt came in and wanted June chemo moved to earlier slots

## 2015-08-19 NOTE — Progress Notes (Signed)
[facility] [phone]   [phone]  OFFICE PROGRESS NOTE  GREEN, Kathleen Spare, MD [address] [place] [address]  DIAGNOSIS: Progressive non-small cell lung cancer initially diagnosed as Stage IIB/IIIA non-small cell lung cancer consistent with adenocarcinoma with negative EGFR mutation and negative ALK gene translocation diagnosed in December 2012.   PRIOR THERAPY:  1) Status post radiation therapy to the right upper lobe lung mass completed on 06/04/2011 under the care of Dr. Pablo Ledger.  2) Systemic chemotherapy with carboplatin for AUC of 5 and Alimta 500 mg/M2 giving every 3 weeks, status post 3 cycles, last dose was given on 07/21/2011.  3) Systemic chemotherapy with carboplatin for AUC of 5 and Alimta 500 MG/M2 every 3 weeks. First dose on 01/02/2015. Status post 2 cycles, last dose was given 01/30/2015. Her treatment is currently on hold secondary to intolerance.  CURRENT THERAPY: Immunotherapy with Nivolumab 240 MG every 2 weeks. First dose 08/27/2015  INTERVAL HISTORY: [name] 70 y.o. female returns to the clinic today for routine four month follow up visit accompanied by her daughter. The patient has been off treatment for around 6 months. She had rough time with her systemic chemotherapy with increasing fatigue and weakness as well as pancytopenia. Her treatment was discontinued. She is feeling fine today except for the baseline shortness of breath. She still on home oxygen 2 L/m. She also has some epigastric pain. She denied having any significant weight loss or night sweats. She has no nausea or vomiting, no fever or chills. She had repeat CT scan of the chest performed recently and she is here for evaluation and discussion of her scan results.  MEDICAL HISTORY: Past Medical History  Diagnosis Date  . 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 Restless legs syndrome (RLS)   . Chronic sinusitis   . Hypertension   .  Hyperlipidemia   . Anemia, unspecified   . GERD (gastroesophageal reflux disease)   . Respiratory failure with hypoxia 01/02/2010  . Complication of anesthesia     slow to wake up  . Asthma   . Coronary artery disease     Dr. Burt Knack had stress test done  . Peripheral vascular disease ([place])   . PAD (peripheral artery disease) ([employer])   . Heart murmur   . Dysrhythmia     irregular  . Anginal pain ([place])   . History of blood clots     "2 in my aorta"  . Hard of hearing, right   . History of bronchitis   . Bronchial pneumonia     history  . COPD (chronic obstructive pulmonary disease) ([place])   . Emphysema   . Shortness of breath     "anytime really"  . Syncope and collapse   . Chemotherapy adverse reaction 09/15/11    "makes me light headed and pass out; this is 3rd blood transfusion since going thru it"  . History of blood transfusion   . Kidney infection     "related to chemo"  . Sinus headache     "all the time"  . Arthritis     "all over my body"  . Fibromyalgia   . Osteoporosis   . DDD (degenerative disc disease), lumbar   . Scoliosis   . Anxiety   . Mental disorder   . Reflux   . CHF (congestive heart failure) ([place])   . DVT (deep venous thrombosis) ([place])   . Hx of radiation therapy 04/28/11 -  06/08/11    right upper lobe-lung  . Acute upper respiratory infections of unspecified site   . Pain in thoracic spine   . Asymptomatic varicose veins   . Functional urinary incontinence   . Diseases of lips   . Other abnormal blood chemistry   . Effects of radiation, unspecified   . Anemia in neoplastic disease   . Muscle weakness (generalized)   . Dizziness and giddiness   . Spasm of muscle   . Unspecified vitamin D deficiency   . Hyperpotassemia   . Acute posthemorrhagic anemia   . Unspecified hearing loss   . Disorder of bone and cartilage, unspecified   . Screening for thyroid disorder   . Hypopotassemia   . Atherosclerosis of native arteries of the extremities,  unspecified   . Reflux esophagitis   . Unspecified constipation   . Unspecified hereditary and idiopathic peripheral neuropathy   . Urinary tract infection, site not specified   . Muscle weakness (generalized)   . Acute sinusitis, unspecified   . Herpes zoster without mention of complication   . 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 Other and unspecified hyperlipidemia   . Anxiety state, unspecified   . Depressive disorder, not elsewhere classified   . Unspecified essential hypertension   . Atrial fibrillation ([place])   . Chronic airway obstruction, not elsewhere classified   . Osteoarthrosis, unspecified whether generalized or localized, unspecified site   . Lumbago   . Hip pain, chronic 09/17/2014  . Type II or unspecified type diabetes mellitus without mention of complication, not stated as uncontrolled   . Type II or unspecified type diabetes mellitus with neurological manifestations, uncontrolled   . Type II or unspecified type diabetes mellitus without mention of complication, not stated as uncontrolled   . Adenocarcinoma, lung ([place])     upper left lobe  . Non-small cell lung cancer ([place]) 03/31/2011    Adenocarcinoma Rt upper lobe mass  . Malignant neoplasm of bronchus and lung, unspecified site   . Primary cancer of right upper lobe of lung ([place]) 02/08/2011    Biopsy Rt upper lobe mass 03/31/11>>adenocarcinoma DIAGNOSIS: Stage IIB/IIIA non-small cell lung cancer consistent with adenocarcinoma diagnosed in December 2012.   PRIOR THERAPY:  1) Status post radiation therapy to the right upper lobe lung mass completed on 06/04/2011 under the care of Dr. Pablo Ledger.  2) Systemic chemotherapy with carboplatin for AUC of 5 and Alimta 500 mg/M2 giving every 3     ALLERGIES:  is allergic to iohexol; other; anoro ellipta; codeine; and protonix.  MEDICATIONS:  Current Outpatient Prescriptions  Medication Sig Dispense Refill  . albuterol (PROVENTIL HFA;VENTOLIN HFA) 108 (90 Base)  MCG/ACT inhaler Inhale 2 puffs into the lungs every 4 (four) hours as needed for wheezing or shortness of breath. 1 Inhaler 2  . albuterol-ipratropium (COMBIVENT) 18-103 MCG/ACT inhaler Inhale 2 puffs into the lungs every 4 (four) hours as needed (lung cancer).     . ALPRAZolam (XANAX) 1 MG tablet     . AMBULATORY NON FORMULARY MEDICATION Lighter Weight Rollator with Padded Seat and Hand Brakes Dx: 496, 729.1, 722.52 1 each 0  . AMBULATORY NON FORMULARY MEDICATION Raised Commode Seat with Rail Dx: M51.36, M54.5 1 each 0  . aspirin 81 MG tablet Take 81 mg by mouth daily.    . ciprofloxacin (CIPRO) 500 MG tablet Take 1 tablet (500 mg total) by mouth 2 (two) times daily. 20 tablet 0  . citalopram (CELEXA) 20 MG  tablet TAKE 1 TABLET BY MOUTH EVERY DAY TO HELP MOOD 90 tablet 0  . COMBIVENT RESPIMAT 20-100 MCG/ACT AERS respimat INHALE 1 PUFF BY MOUTH EVERY 6 HOURS AS NEEDED FOR SHORTNESS OF BREATH (Patient taking differently: 1 puff by mouth every 6 hours as ne) 12 Inhaler 3  . diazepam (VALIUM) 5 MG tablet Take 1 tablet (5 mg total) by mouth every 12 (twelve) hours as needed for anxiety. 60 tablet 1  . diltiazem (CARDIZEM CD) 240 MG 24 hr capsule TAKE 1 CAPSULE BY MOUTH EVERY DAY FOR BLOOD PRESSURE. 30 capsule 1  . diltiazem (CARDIZEM CD) 240 MG 24 hr capsule TAKE 1 CAPSULE BY MOUTH EVERY DAY FOR BLOOD PRESSURE. 30 capsule 1  . Ferrous Fumarate-Folic Acid (HEMATINIC/FOLIC ACID) 932-3 MG TABS Take 1 tablet by mouth daily. 30 each 0  . fluticasone (FLONASE) 50 MCG/ACT nasal spray INSTILL 2 SPRAYS INTO BOTH NOSTRILS  TWICE DAILY *NEEDS APPOINTMENT* 16 g 5  . folic acid (FOLVITE) 1 MG tablet Take 1 tablet (1 mg total) by mouth daily. 30 tablet 4  . gemfibrozil (LOPID) 600 MG tablet TAKE 1 TABLET BY MOUTH ONCE DAILY 30 tablet 3  . glucose blood (FREESTYLE LITE) test strip Use to test blood sugar three times daily. Dx: E11.9, E11.49 100 each 12  . HYDROcodone-acetaminophen (NORCO) 10-325 MG tablet Take One  tablet by mouth every 6 hours if needed for pain 120 tablet 0  . HYDROcodone-homatropine (HYCODAN) 5-1.5 MG/5ML syrup Take 5 mLs by mouth every 6 (six) hours as needed for cough. 120 mL 0  . Lancets (FREESTYLE) lancets Use to test blood sugar three times daily. Dx: E11.9, E11.49 100 each 12  . loratadine (CLARITIN) 10 MG tablet TAKE 1 TABLET BY MOUTH EVERY DAY 30 tablet 3  . losartan (COZAAR) 50 MG tablet TAKE 1 TABLET BY MOUTH TWICE DAILY 60 tablet 5  . metFORMIN (GLUCOPHAGE) 500 MG tablet TAKE 1 TABLET BY MOUTH TWICE DAILY TO CONTROL BLOOD SUGAR. 60 tablet 1  . metoCLOPramide (REGLAN) 5 MG tablet Take 5 mg by mouth 4 (four) times daily.    . Multiple Vitamins-Minerals (CENTRUM SILVER PO) Take 1 tablet by mouth daily. Patient unsure of dose    . NON FORMULARY Place 3 L into the nose daily. Oxygen    . nystatin (MYCOSTATIN) 100000 UNIT/ML suspension     . potassium chloride (K-DUR,KLOR-CON) 10 MEQ tablet TAKE 1 TABLET BY MOUTH EVERY DAY 30 tablet 5  . ranitidine (ZANTAC) 300 MG tablet TAKE 1 TABLET BY MOUTH EVERY NIGHT AT BEDTIME 30 tablet 3  . traMADol (ULTRAM) 50 MG tablet TAKE 1 TABLET BY MOUTH THREE TIMES A DAY AS NEEDED FOR PAIN 90 tablet 5  . triamterene-hydrochlorothiazide (MAXZIDE-25) 37.5-25 MG tablet TAKE 1 TABLET BY MOUTH EVERY DAY TO CONTROL BLOOD PRESSURE 90 tablet 2  . [DISCONTINUED] calcium carbonate 200 MG capsule Take 250 mg by mouth daily. Does not take everyday     No current facility-administered medications for this visit.   Facility-Administered Medications Ordered in Other Visits  Medication Dose Route Frequency Provider Last Rate Last Dose  . hyaluronate sodium (RADIAPLEXRX) gel   Topical Once Thea Silversmith, MD      . topical emolient (BIAFINE) emulsion   Topical PRN Thea Silversmith, MD        SURGICAL HISTORY:  Past Surgical History  Procedure Laterality Date  . Lung lobectomy  1986    left  . Septoplasty    . Total hip arthroplasty  01/2010    left  .  Bronchoscopy  02/2011  . Dilation and curettage of uterus  1980's  . Tubal ligation  1980's  . Cardiac catheterization    . Fetal blood transfusion  09/16/11  . Joint replacement  04/13/10    Left Hip  . Cataract extraction  03/12/2013    Left Eye   . Eye surgery Left 2014    Cataract    REVIEW OF SYSTEMS:  Constitutional: positive for fatigue Eyes: negative Ears, nose, mouth, throat, and face: negative Respiratory: positive for cough, dyspnea on exertion and sputum Cardiovascular: negative Gastrointestinal: positive for dyspepsia Genitourinary:negative Integument/breast: negative Hematologic/lymphatic: negative Musculoskeletal:negative Neurological: negative Behavioral/Psych: negative Endocrine: negative Allergic/Immunologic: negative   PHYSICAL EXAMINATION: General appearance: alert, cooperative, fatigued and no distress Head: Normocephalic, without obvious abnormality, atraumatic Neck: no adenopathy, no JVD, supple, symmetrical, trachea midline and thyroid not enlarged, symmetric, no tenderness/mass/nodules Lymph nodes: Cervical, supraclavicular, and axillary nodes normal. Resp: clear to auscultation bilaterally Back: symmetric, no curvature. ROM normal. No CVA tenderness. Cardio: regular rate and rhythm, S1, S2 normal, no murmur, click, rub or gallop GI: soft, non-tender; bowel sounds normal; no masses,  no organomegaly Extremities: extremities normal, atraumatic, no cyanosis or edema Neurologic: Alert and oriented X 3, normal strength and tone. Normal symmetric reflexes. Normal coordination and gait  ECOG PERFORMANCE STATUS: 1 - Symptomatic but completely ambulatory  Blood pressure 105/75, pulse 95, temperature 99.3 F (37.4 C), temperature source Oral, resp. rate 18, height _0  (1.676 m), weight 166 lb 14.4 oz (75.705 kg), SpO2 99 %.  LABORATORY DATA: Lab Results  Component Value Date   WBC 12.0* 08/12/2015   HGB 11.1* 08/12/2015   HCT 33.9* 08/12/2015   MCV 92.8  08/12/2015   PLT 298 08/12/2015      Chemistry      Component Value Date/Time   NA 137 08/12/2015 1224   NA 139 08/06/2015 1036   NA 138 04/15/2015 1455   NA 142 10/25/2011 1200   K 4.4 08/12/2015 1224   K 3.9 08/06/2015 1036   K 4.6 10/25/2011 1200   CL 97 08/06/2015 1036   CL 99 07/21/2012 1125   CL 97* 10/25/2011 1200   CO2 23 08/12/2015 1224   CO2 23 08/06/2015 1036   CO2 24 10/25/2011 1200   BUN 25.4 08/12/2015 1224   BUN 19 08/06/2015 1036   BUN 16 04/15/2015 1455   BUN 15 10/25/2011 1200   CREATININE 1.4* 08/12/2015 1224   CREATININE 1.29* 08/06/2015 1036   CREATININE 0.8 10/25/2011 1200      Component Value Date/Time   CALCIUM 10.1 08/12/2015 1224   CALCIUM 9.9 08/06/2015 1036   CALCIUM 9.6 10/25/2011 1200   ALKPHOS 100 08/12/2015 1224   ALKPHOS 118* 08/06/2015 1036   ALKPHOS 106* 10/25/2011 1200   AST 17 08/12/2015 1224   AST 22 08/06/2015 1036   AST 25 10/25/2011 1200   ALT 14 08/12/2015 1224   ALT 10 08/06/2015 1036   ALT 14 10/25/2011 1200   BILITOT <0.30 08/12/2015 1224   BILITOT 0.3 08/06/2015 1036   BILITOT 0.9 04/15/2015 1455   BILITOT 0.60 10/25/2011 1200       RADIOGRAPHIC STUDIES: Ct Chest W Contrast  08/12/2015  CLINICAL DATA:  Reported history of left upper lobe lung cancer resected in 1986 and right upper lobe lung adenocarcinoma diagnosed in 2012 treated with chemotherapy and radiation therapy. Restaging. EXAM: CT CHEST WITH CONTRAST TECHNIQUE: Multidetector CT imaging of the chest was  performed during intravenous contrast administration. CONTRAST:  76m ISOVUE-300 IOPAMIDOL (ISOVUE-300) INJECTION 61% COMPARISON:  04/08/2015 chest CT.  04/15/2015 chest radiograph. FINDINGS: Mediastinum/Nodes: Normal heart size. Stable trace pericardial fluid/ thickening anteriorly. Left anterior descending, left circumflex and right coronary atherosclerosis. Atherosclerotic nonaneurysmal thoracic aorta. Normal caliber pulmonary arteries. No central pulmonary  emboli. Normal visualized thyroid. Fluid level throughout the mid to upper thoracic esophagus. No appreciable esophageal wall thickening. No pathologically enlarged axillary, mediastinal or hilar lymph nodes. Lungs/Pleura: No pneumothorax. Small layering right pleural effusion, slightly increased. No left pleural effusion. Status post left upper lobectomy. Moderate centrilobular and paraseptal emphysema with diffuse bronchial wall thickening. Stable sharply marginated masslike fibrosis in the central right upper lobe and central right lower lobe with associated significant volume loss, in keeping with stable radiation fibrosis. Irregular ground-glass 8.8 x 5.2 cm lesion in the right middle lobe (series 5/image 62), significantly increased from 6.8 x 4.2 cm on 04/08/2015. Solid 4 mm medial right middle lobe pulmonary nodule (series 5/ image 70) is stable and probably a focus of mucous plugging. Ground-glass left upper lobe 8 mm pulmonary nodule (series 5/image 50), stable. Stable scarring in the posterior parahilar left lower lobe. No acute consolidative airspace disease or new significant pulmonary nodules. Upper abdomen: Stable small bilateral adrenal adenomas, 1.6 cm on the right and 1.4 cm on the left. Musculoskeletal: No aggressive appearing focal osseous lesions. Incompletely healed right posterior fifth and sixth rib fractures. Stable chronic mild T4 and T5 vertebral compression fractures with stable changes from T4 vertebroplasty. IMPRESSION: 1. Significant interval growth of large ground-glass lesion in the right middle lobe, worrisome for invasive lung adenocarcinoma. 2. Stable radiation fibrosis and volume loss in the right upper lobe. Additional subcentimeter pulmonary nodules are stable. 3. Stable small right pleural effusion. 4. Moderate emphysema and diffuse bronchial wall thickening, suggesting COPD. 5. Incompletely healed posterior right fifth and sixth rib fractures. 6. Three-vessel coronary  atherosclerosis. 7. Fluid in the thoracic esophagus, suggesting esophageal dysmotility and/or gastroesophageal reflux. 8. Stable bilateral adrenal adenomas. Electronically Signed   By: JIlona SorrelM.D.   On: 08/12/2015 15:45   ASSESSMENT AND PLAN: this is a very pleasant 70years old white female with history of stage IIB/IIIA non-small cell lung cancer status post concurrent chemoradiation followed by consolidation chemotherapy with carboplatin and Alimta and has been observation since April 2013. She has no evidence for disease progression on his recent scan except for a continuous enlargement of the right upper lobe opacity. The molecular study for EGFR mutation was reported to be negative. The patient was started on treatment with systemic chemotherapy with carboplatin and Alimta status post 2 cycles but her treatment has been on hold secondary to increasing fatigue and weakness as well as pancytopenia. The restaging scan of the chest performed on 04/07/2016 showed mild improvement of her disease. I discussed the scan results with the patient and her daughter. I recommended for her to continue on observation for now with repeat CT scan of the chest in 3 months for restaging of her disease. The CT scan of the chest performed on 08/12/2015 showed evidence for disease progression in the right lung. I discussed the scan results and showed the images to the patient and her daughter. I gave her the option of palliative care and hospice referral versus consideration of treatment with immunotherapy with Nivolumab 240 MG IV every 2 weeks. The patient is interested in treatment with immunotherapy. I discussed with her the adverse effect of this treatment including  but not limited to immune mediated skin rash, diarrhea, liver, renal, thyroid or other endocrine dysfunction. She is expected to start the first cycle of this treatment next week. I will see her back for follow-up visit in 3 weeks for evaluation with  the start of cycle #2. For the dyspepsia and reflux disease, the patient will continue on treatment with Reglan and Tagamet as prescribed by her primary care physician. She was advised to call immediately if she has any concerning symptoms in the interval. The patient voices understanding of current disease status and treatment options and is in agreement with the current care plan.  All questions were answered. The patient knows to call the clinic with any problems, questions or concerns. We can certainly see the patient much sooner if necessary.  Disclaimer: This note was dictated with voice recognition software. Similar sounding words can inadvertently be transcribed and may not be corrected upon review.

## 2015-08-19 NOTE — Telephone Encounter (Addendum)
gave pt appt & avs

## 2015-08-27 ENCOUNTER — Other Ambulatory Visit (HOSPITAL_BASED_OUTPATIENT_CLINIC_OR_DEPARTMENT_OTHER): Payer: Medicare Other

## 2015-08-27 ENCOUNTER — Ambulatory Visit (HOSPITAL_BASED_OUTPATIENT_CLINIC_OR_DEPARTMENT_OTHER): Payer: Medicare Other

## 2015-08-27 VITALS — BP 131/46 | HR 78 | Temp 98.5°F | Resp 20

## 2015-08-27 DIAGNOSIS — R5382 Chronic fatigue, unspecified: Secondary | ICD-10-CM

## 2015-08-27 DIAGNOSIS — Z5112 Encounter for antineoplastic immunotherapy: Secondary | ICD-10-CM | POA: Diagnosis not present

## 2015-08-27 DIAGNOSIS — C3411 Malignant neoplasm of upper lobe, right bronchus or lung: Secondary | ICD-10-CM

## 2015-08-27 DIAGNOSIS — Z79899 Other long term (current) drug therapy: Secondary | ICD-10-CM

## 2015-08-27 DIAGNOSIS — C3491 Malignant neoplasm of unspecified part of right bronchus or lung: Secondary | ICD-10-CM

## 2015-08-27 LAB — COMPREHENSIVE METABOLIC PANEL
ALT: 13 U/L (ref 0–55)
ANION GAP: 11 meq/L (ref 3–11)
AST: 17 U/L (ref 5–34)
Albumin: 4 g/dL (ref 3.5–5.0)
Alkaline Phosphatase: 105 U/L (ref 40–150)
BILIRUBIN TOTAL: 0.39 mg/dL (ref 0.20–1.20)
BUN: 19.9 mg/dL (ref 7.0–26.0)
CHLORIDE: 102 meq/L (ref 98–109)
CO2: 28 meq/L (ref 22–29)
Calcium: 10.5 mg/dL — ABNORMAL HIGH (ref 8.4–10.4)
Creatinine: 1.3 mg/dL — ABNORMAL HIGH (ref 0.6–1.1)
EGFR: 41 mL/min/{1.73_m2} — AB (ref >90)
Glucose: 104 mg/dl (ref 70–140)
POTASSIUM: 3.8 meq/L (ref 3.5–5.1)
SODIUM: 141 meq/L (ref 136–145)
TOTAL PROTEIN: 7.1 g/dL (ref 6.4–8.3)

## 2015-08-27 LAB — TSH: TSH: 2.763 m(IU)/L (ref 0.308–3.960)

## 2015-08-27 LAB — CBC WITH DIFFERENTIAL/PLATELET
BASO%: 0.6 % (ref 0.0–2.0)
BASOS ABS: 0 10*3/uL (ref 0.0–0.1)
EOS ABS: 0.3 10*3/uL (ref 0.0–0.5)
EOS%: 4.1 % (ref 0.0–7.0)
HCT: 35.3 % (ref 34.8–46.6)
HGB: 11.6 g/dL (ref 11.6–15.9)
LYMPH%: 24.5 % (ref 14.0–49.7)
MCH: 30.4 pg (ref 25.1–34.0)
MCHC: 32.8 g/dL (ref 31.5–36.0)
MCV: 92.7 fL (ref 79.5–101.0)
MONO#: 0.7 10*3/uL (ref 0.1–0.9)
MONO%: 8.3 % (ref 0.0–14.0)
NEUT#: 5.2 10*3/uL (ref 1.5–6.5)
NEUT%: 62.5 % (ref 38.4–76.8)
PLATELETS: 315 10*3/uL (ref 145–400)
RBC: 3.81 10*6/uL (ref 3.70–5.45)
RDW: 11.9 % (ref 11.2–14.5)
WBC: 8.3 10*3/uL (ref 3.9–10.3)
lymph#: 2 10*3/uL (ref 0.9–3.3)

## 2015-08-27 MED ORDER — NIVOLUMAB CHEMO INJECTION 100 MG/10ML
240.0000 mg | Freq: Once | INTRAVENOUS | Status: AC
  Administered 2015-08-27: 240 mg via INTRAVENOUS
  Filled 2015-08-27: qty 20

## 2015-08-27 MED ORDER — SODIUM CHLORIDE 0.9 % IV SOLN
Freq: Once | INTRAVENOUS | Status: AC
  Administered 2015-08-27: 13:00:00 via INTRAVENOUS

## 2015-08-27 NOTE — Patient Instructions (Addendum)
[facility] Discharge Instructions for Patients Receiving Chemotherapy  Today you received the following chemotherapy agents: Nivolumab.  To help prevent nausea and vomiting after your treatment, we encourage you to take your nausea medication as directed.     If you develop nausea and vomiting that is not controlled by your nausea medication, call the clinic.   BELOW ARE SYMPTOMS THAT SHOULD BE REPORTED IMMEDIATELY:  *FEVER GREATER THAN 100.5 F  *CHILLS WITH OR WITHOUT FEVER  NAUSEA AND VOMITING THAT IS NOT CONTROLLED WITH YOUR NAUSEA MEDICATION  *UNUSUAL SHORTNESS OF BREATH  *UNUSUAL BRUISING OR BLEEDING  TENDERNESS IN MOUTH AND THROAT WITH OR WITHOUT PRESENCE OF ULCERS  *URINARY PROBLEMS  *BOWEL PROBLEMS  UNUSUAL RASH Items with * indicate a potential emergency and should be followed up as soon as possible.  Feel free to call the clinic you have any questions or concerns. The clinic phone number is [phone].  Please show the [facility] at check-in to the Emergency Department and triage nurse.    Nivolumab injection What is this medicine? NIVOLUMAB (nye VOL ue mab) is a monoclonal antibody. It is used to treat melanoma, lung cancer, kidney cancer, and Hodgkin lymphoma. This medicine may be used for other purposes; ask your health care provider or pharmacist if you have questions. What should I tell my health care provider before I take this medicine? They need to know if you have any of these conditions: -diabetes -immune system problems -kidney disease -liver disease -lung disease -organ transplant -stomach or intestine problems -thyroid disease -an unusual or allergic reaction to nivolumab, other medicines, foods, dyes, or preservatives -pregnant or trying to get pregnant -breast-feeding How should I use this medicine? This medicine is for infusion into a vein. It is given by a health care professional in a hospital or clinic  setting. A special MedGuide will be given to you before each treatment. Be sure to read this information carefully each time. Talk to your pediatrician regarding the use of this medicine in children. Special care may be needed. Overdosage: If you think you have taken too much of this medicine contact a poison control center or emergency room at once. NOTE: This medicine is only for you. Do not share this medicine with others. What if I miss a dose? It is important not to miss your dose. Call your doctor or health care professional if you are unable to keep an appointment. What may interact with this medicine? Interactions have not been studied. Give your health care provider a list of all the medicines, herbs, non-prescription drugs, or dietary supplements you use. Also tell them if you smoke, drink alcohol, or use illegal drugs. Some items may interact with your medicine. This list may not describe all possible interactions. Give your health care provider a list of all the medicines, herbs, non-prescription drugs, or dietary supplements you use. Also tell them if you smoke, drink alcohol, or use illegal drugs. Some items may interact with your medicine. What should I watch for while using this medicine? This drug may make you feel generally unwell. Continue your course of treatment even though you feel ill unless your doctor tells you to stop. You may need blood work done while you are taking this medicine. Do not become pregnant while taking this medicine or for 5 months after stopping it. Women should inform their doctor if they wish to become pregnant or think they might be pregnant. There is a potential for serious side  effects to an unborn child. Talk to your health care professional or pharmacist for more information. Do not breast-feed an infant while taking this medicine. What side effects may I notice from receiving this medicine? Side effects that you should report to your doctor or health  care professional as soon as possible: -allergic reactions like skin rash, itching or hives, swelling of the face, lips, or tongue -black, tarry stools -blood in the urine -bloody or watery diarrhea -changes in vision -change in sex drive -changes in emotions or moods -chest pain -confusion -cough -decreased appetite -diarrhea -facial flushing -feeling faint or lightheaded -fever, chills -hair loss -hallucination, loss of contact with reality -headache -irritable -joint pain -loss of memory -muscle pain -muscle weakness -seizures -shortness of breath -signs and symptoms of high blood sugar such as dizziness; dry mouth; dry skin; fruity breath; nausea; stomach pain; increased hunger or thirst; increased urination -signs and symptoms of kidney injury like trouble passing urine or change in the amount of urine -signs and symptoms of liver injury like dark yellow or brown urine; general ill feeling or flu-like symptoms; light-colored stools; loss of appetite; nausea; right upper belly pain; unusually weak or tired; yellowing of the eyes or skin -stiff neck -swelling of the ankles, feet, hands -weight gain Side effects that usually do not require medical attention (report to your doctor or health care professional if they continue or are bothersome): -bone pain -constipation -tiredness -vomiting This list may not describe all possible side effects. Call your doctor for medical advice about side effects. You may report side effects to FDA at 1-800-FDA-1088. Where should I keep my medicine? This drug is given in a hospital or clinic and will not be stored at home. NOTE: This sheet is a summary. It may not cover all possible information. If you have questions about this medicine, talk to your doctor, pharmacist, or health care provider.    2016, [employer]. (2014-08-28 10:03:42)

## 2015-08-28 ENCOUNTER — Telehealth: Payer: Self-pay

## 2015-08-28 NOTE — Telephone Encounter (Signed)
-----   Message from Flo Shanks, RN sent at 08/27/2015  1:50 PM EDT ----- Regarding: 1st time chemo/ [name] Ist time Nivolumab.

## 2015-08-28 NOTE — Telephone Encounter (Signed)
[place] for chemotherapy F/U.  Patient c/o itchy throat and swollen lips, spoke to MM who said to tell her to take a benedryl and if symptoms don't subside after a while to go to the ED.  Pt verbalized understanding.

## 2015-09-04 ENCOUNTER — Other Ambulatory Visit: Payer: Self-pay | Admitting: Internal Medicine

## 2015-09-05 ENCOUNTER — Other Ambulatory Visit: Payer: Self-pay | Admitting: *Deleted

## 2015-09-05 DIAGNOSIS — M51369 Other intervertebral disc degeneration, lumbar region without mention of lumbar back pain or lower extremity pain: Secondary | ICD-10-CM

## 2015-09-05 DIAGNOSIS — G44229 Chronic tension-type headache, not intractable: Secondary | ICD-10-CM

## 2015-09-05 DIAGNOSIS — M25552 Pain in left hip: Secondary | ICD-10-CM

## 2015-09-05 DIAGNOSIS — G8929 Other chronic pain: Secondary | ICD-10-CM

## 2015-09-05 DIAGNOSIS — M5136 Other intervertebral disc degeneration, lumbar region: Secondary | ICD-10-CM

## 2015-09-05 MED ORDER — HYDROCODONE-ACETAMINOPHEN 10-325 MG PO TABS: ORAL_TABLET | ORAL | Status: DC

## 2015-09-05 NOTE — Telephone Encounter (Signed)
Patient requested and will pick up 

## 2015-09-05 NOTE — Telephone Encounter (Signed)
Spoke with patient and advised rx ready for pick-up and it will be at the front desk.  

## 2015-09-10 ENCOUNTER — Other Ambulatory Visit (HOSPITAL_BASED_OUTPATIENT_CLINIC_OR_DEPARTMENT_OTHER): Payer: Medicare Other

## 2015-09-10 ENCOUNTER — Telehealth: Payer: Self-pay | Admitting: Nurse Practitioner

## 2015-09-10 ENCOUNTER — Telehealth: Payer: Self-pay | Admitting: Internal Medicine

## 2015-09-10 ENCOUNTER — Ambulatory Visit (HOSPITAL_BASED_OUTPATIENT_CLINIC_OR_DEPARTMENT_OTHER): Payer: Medicare Other | Admitting: Nurse Practitioner

## 2015-09-10 ENCOUNTER — Telehealth: Payer: Self-pay | Admitting: *Deleted

## 2015-09-10 ENCOUNTER — Ambulatory Visit (HOSPITAL_BASED_OUTPATIENT_CLINIC_OR_DEPARTMENT_OTHER): Payer: Medicare Other

## 2015-09-10 VITALS — BP 148/54 | HR 92 | Temp 98.3°F | Resp 18 | Ht 66.0 in | Wt 164.8 lb

## 2015-09-10 DIAGNOSIS — R197 Diarrhea, unspecified: Secondary | ICD-10-CM | POA: Diagnosis not present

## 2015-09-10 DIAGNOSIS — Z5112 Encounter for antineoplastic immunotherapy: Secondary | ICD-10-CM

## 2015-09-10 DIAGNOSIS — C3491 Malignant neoplasm of unspecified part of right bronchus or lung: Secondary | ICD-10-CM

## 2015-09-10 DIAGNOSIS — C3411 Malignant neoplasm of upper lobe, right bronchus or lung: Secondary | ICD-10-CM

## 2015-09-10 DIAGNOSIS — R21 Rash and other nonspecific skin eruption: Secondary | ICD-10-CM | POA: Diagnosis not present

## 2015-09-10 LAB — CBC WITH DIFFERENTIAL/PLATELET
BASO%: 0.6 % (ref 0.0–2.0)
Basophils Absolute: 0.1 10*3/uL (ref 0.0–0.1)
EOS ABS: 0.4 10*3/uL (ref 0.0–0.5)
EOS%: 4.8 % (ref 0.0–7.0)
HCT: 35.5 % (ref 34.8–46.6)
HGB: 11.9 g/dL (ref 11.6–15.9)
LYMPH%: 21.5 % (ref 14.0–49.7)
MCH: 30.9 pg (ref 25.1–34.0)
MCHC: 33.4 g/dL (ref 31.5–36.0)
MCV: 92.5 fL (ref 79.5–101.0)
MONO#: 0.7 10*3/uL (ref 0.1–0.9)
MONO%: 8.4 % (ref 0.0–14.0)
NEUT%: 64.7 % (ref 38.4–76.8)
NEUTROS ABS: 5.6 10*3/uL (ref 1.5–6.5)
PLATELETS: 309 10*3/uL (ref 145–400)
RBC: 3.84 10*6/uL (ref 3.70–5.45)
RDW: 12.3 % (ref 11.2–14.5)
WBC: 8.7 10*3/uL (ref 3.9–10.3)
lymph#: 1.9 10*3/uL (ref 0.9–3.3)

## 2015-09-10 LAB — COMPREHENSIVE METABOLIC PANEL
ALK PHOS: 109 U/L (ref 40–150)
ALT: 11 U/L (ref 0–55)
ANION GAP: 14 meq/L — AB (ref 3–11)
AST: 16 U/L (ref 5–34)
Albumin: 4.1 g/dL (ref 3.5–5.0)
BILIRUBIN TOTAL: 0.45 mg/dL (ref 0.20–1.20)
BUN: 16 mg/dL (ref 7.0–26.0)
CALCIUM: 10.1 mg/dL (ref 8.4–10.4)
CO2: 25 meq/L (ref 22–29)
Chloride: 103 mEq/L (ref 98–109)
Creatinine: 1.2 mg/dL — ABNORMAL HIGH (ref 0.6–1.1)
EGFR: 46 mL/min/{1.73_m2} — AB (ref >90)
Glucose: 94 mg/dl (ref 70–140)
Potassium: 3.7 mEq/L (ref 3.5–5.1)
Sodium: 142 mEq/L (ref 136–145)
TOTAL PROTEIN: 7.6 g/dL (ref 6.4–8.3)

## 2015-09-10 MED ORDER — SODIUM CHLORIDE 0.9 % IV SOLN
Freq: Once | INTRAVENOUS | Status: AC
  Administered 2015-09-10: 13:00:00 via INTRAVENOUS

## 2015-09-10 MED ORDER — HEPARIN SOD (PORK) LOCK FLUSH 100 UNIT/ML IV SOLN
500.0000 [IU] | Freq: Once | INTRAVENOUS | Status: DC | PRN
  Filled 2015-09-10: qty 5

## 2015-09-10 MED ORDER — SODIUM CHLORIDE 0.9% FLUSH
10.0000 mL | INTRAVENOUS | Status: DC | PRN
  Filled 2015-09-10: qty 10

## 2015-09-10 MED ORDER — SODIUM CHLORIDE 0.9 % IV SOLN
240.0000 mg | Freq: Once | INTRAVENOUS | Status: AC
  Administered 2015-09-10: 240 mg via INTRAVENOUS
  Filled 2015-09-10: qty 8

## 2015-09-10 NOTE — Telephone Encounter (Signed)
per pof to sch pt appt-sent MW email to sch trmt-pt to get updated copy b4 leaving

## 2015-09-10 NOTE — Telephone Encounter (Signed)
Per staff message and POF I have scheduled appts. Advised scheduler of appts. JMW  

## 2015-09-10 NOTE — Telephone Encounter (Signed)
per pof to sch pt appt-ld pt and adv of appt time & date for 6/14 appt'@11'$ 

## 2015-09-10 NOTE — Progress Notes (Signed)
  [facility] OFFICE PROGRESS NOTE   DIAGNOSIS: Progressive non-small cell lung cancer initially diagnosed as Stage IIB/IIIA non-small cell lung cancer consistent with adenocarcinoma with negative EGFR mutation and negative ALK gene translocation diagnosed in December 2012.   PRIOR THERAPY:  1) Status post radiation therapy to the right upper lobe lung mass completed on 06/04/2011 under the care of Dr. Pablo Ledger.  2) Systemic chemotherapy with carboplatin for AUC of 5 and Alimta 500 mg/M2 giving every 3 weeks, status post 3 cycles, last dose was given on 07/21/2011.  3) Systemic chemotherapy with carboplatin for AUC of 5 and Alimta 500 MG/M2 every 3 weeks. First dose on 01/02/2015. Status post 2 cycles, last dose was given 01/30/2015. Her treatment is currently on hold secondary to intolerance.  CURRENT THERAPY: Immunotherapy with Nivolumab 240 MG every 2 weeks. First dose 08/27/2015   INTERVAL HISTORY:   [name] returns as scheduled. She completed cycle 1 nivolumab 08/27/2015. She denies nausea/vomiting. No mouth sores. She had some loose stools yesterday. None today. She reports excessive "dry itchy skin" before beginning nivolumab. Symptoms have persisted. She is applying multiple lotions. No rash. She has stable dyspnea and a stable cough. She continues supplemental oxygen at 2 L/m.  Objective:  Vital signs in last 24 hours:  Blood pressure 148/54, pulse 92, temperature 98.3 F (36.8 C), temperature source Oral, resp. rate 18, height '5\' 6"'$  (1.676 m), weight 164 lb 12.8 oz (74.753 kg), SpO2 97 %.    HEENT: No thrush or ulcers. Resp: Lungs clear bilaterally. Cardio: Regular rate and rhythm. GI: Abdomen soft and nontender. No hepatomegaly. Vascular: No leg edema. Skin: No rash. Skin in general has a dry appearance.    Lab Results:  Lab Results  Component Value Date   WBC 8.7 09/10/2015   HGB 11.9 09/10/2015   HCT 35.5 09/10/2015   MCV 92.5 09/10/2015   PLT 309 09/10/2015   NEUTROABS 5.6 09/10/2015    Imaging:  No results found.  Medications: I have reviewed the patient's current medications.  Assessment/Plan: 1. History of stage IIB/IIIA non-small cell lung cancer December 2012 status post concurrent chemoradiation followed by consolidation chemotherapy with carboplatin and Alimta. Molecular study for EGFR mutation negative. Evidence of disease progression with continuous enlargement of a right upper lobe opacity on CT 12/02/2014. She is status post systemic chemotherapy with carboplatin and Alimta x 2 cycles with treatment subsequently placed on hold secondary to increasing fatigue and weakness as well as pancytopenia. Restaging scan of the chest performed on 04/07/2016 showed mild improvement. CT scan of the chest performed on 08/12/2015 showed evidence for disease progression in the right lung. Treatment initiated with nivolumab 08/27/2015.   Disposition: [name] appears stable. She has completed 1 cycle of nivolumab. Plan to proceed with cycle 2 today as scheduled. She will return for a follow-up visit and cycle 3 in 2 weeks. She will contact the office in the interim with any problems. We specifically discussed skin rash and diarrhea.  Plan reviewed with [name].    Ned Card ANP/GNP-BC   09/10/2015  12:19 PM

## 2015-09-10 NOTE — Patient Instructions (Signed)
[facility] Discharge Instructions for Patients Receiving Chemotherapy  Today you received the following chemotherapy agents Nivolumab.  To help prevent nausea and vomiting after your treatment, we encourage you to take your nausea medication as prescribed.   If you develop nausea and vomiting that is not controlled by your nausea medication, call the clinic.   BELOW ARE SYMPTOMS THAT SHOULD BE REPORTED IMMEDIATELY:  *FEVER GREATER THAN 100.5 F  *CHILLS WITH OR WITHOUT FEVER  NAUSEA AND VOMITING THAT IS NOT CONTROLLED WITH YOUR NAUSEA MEDICATION  *UNUSUAL SHORTNESS OF BREATH  *UNUSUAL BRUISING OR BLEEDING  TENDERNESS IN MOUTH AND THROAT WITH OR WITHOUT PRESENCE OF ULCERS  *URINARY PROBLEMS  *BOWEL PROBLEMS  UNUSUAL RASH Items with * indicate a potential emergency and should be followed up as soon as possible.  Feel free to call the clinic you have any questions or concerns. The clinic phone number is [phone].  Please show the CHEMO ALERT CARD at check-in to the Emergency Department and triage nurse.   

## 2015-09-18 ENCOUNTER — Telehealth: Payer: Self-pay

## 2015-09-18 NOTE — Telephone Encounter (Signed)
Patient called to say she is having a problem with itching do to one of her treatments she is receiving  and benadryl is not helping is there anything you can recommend. Please Advise

## 2015-09-18 NOTE — Telephone Encounter (Signed)
Try Periactin 4 mg (30 tablets) one up to twice daily for itching.

## 2015-09-19 ENCOUNTER — Ambulatory Visit: Payer: Medicare Other | Admitting: Internal Medicine

## 2015-09-19 MED ORDER — CYPROHEPTADINE HCL 4 MG PO TABS: 4.0000 mg | ORAL_TABLET | Freq: Two times a day (BID) | ORAL | Status: DC

## 2015-09-19 NOTE — Telephone Encounter (Signed)
Called patient told her I would be calling a prescription in for her for her itching per Dr. Hervey Ard instructions.

## 2015-09-24 ENCOUNTER — Ambulatory Visit (HOSPITAL_BASED_OUTPATIENT_CLINIC_OR_DEPARTMENT_OTHER): Payer: Medicare Other | Admitting: Internal Medicine

## 2015-09-24 ENCOUNTER — Telehealth: Payer: Self-pay | Admitting: Internal Medicine

## 2015-09-24 ENCOUNTER — Encounter: Payer: Self-pay | Admitting: Internal Medicine

## 2015-09-24 ENCOUNTER — Ambulatory Visit (HOSPITAL_BASED_OUTPATIENT_CLINIC_OR_DEPARTMENT_OTHER): Payer: Medicare Other

## 2015-09-24 ENCOUNTER — Other Ambulatory Visit (HOSPITAL_BASED_OUTPATIENT_CLINIC_OR_DEPARTMENT_OTHER): Payer: Medicare Other

## 2015-09-24 VITALS — BP 148/57 | HR 96 | Temp 98.3°F | Resp 18 | Ht 66.0 in | Wt 165.5 lb

## 2015-09-24 DIAGNOSIS — Z5111 Encounter for antineoplastic chemotherapy: Secondary | ICD-10-CM

## 2015-09-24 DIAGNOSIS — Z5112 Encounter for antineoplastic immunotherapy: Secondary | ICD-10-CM

## 2015-09-24 DIAGNOSIS — Z79899 Other long term (current) drug therapy: Secondary | ICD-10-CM | POA: Diagnosis not present

## 2015-09-24 DIAGNOSIS — C3411 Malignant neoplasm of upper lobe, right bronchus or lung: Secondary | ICD-10-CM | POA: Diagnosis not present

## 2015-09-24 DIAGNOSIS — R5382 Chronic fatigue, unspecified: Secondary | ICD-10-CM

## 2015-09-24 DIAGNOSIS — J438 Other emphysema: Secondary | ICD-10-CM

## 2015-09-24 LAB — TSH: TSH: 1.745 m(IU)/L (ref 0.308–3.960)

## 2015-09-24 LAB — COMPREHENSIVE METABOLIC PANEL
ALBUMIN: 3.9 g/dL (ref 3.5–5.0)
ALT: <9 U/L (ref 0–55)
AST: 16 U/L (ref 5–34)
Alkaline Phosphatase: 112 U/L (ref 40–150)
Anion Gap: 11 mEq/L (ref 3–11)
BUN: 17.1 mg/dL (ref 7.0–26.0)
CHLORIDE: 104 meq/L (ref 98–109)
CO2: 26 meq/L (ref 22–29)
Calcium: 10 mg/dL (ref 8.4–10.4)
Creatinine: 1.1 mg/dL (ref 0.6–1.1)
EGFR: 48 mL/min/{1.73_m2} — ABNORMAL LOW (ref >90)
Glucose: 95 mg/dl (ref 70–140)
POTASSIUM: 3.8 meq/L (ref 3.5–5.1)
SODIUM: 141 meq/L (ref 136–145)
Total Bilirubin: 0.42 mg/dL (ref 0.20–1.20)
Total Protein: 7.2 g/dL (ref 6.4–8.3)

## 2015-09-24 LAB — CBC WITH DIFFERENTIAL/PLATELET
BASO%: 0.6 % (ref 0.0–2.0)
BASOS ABS: 0.1 10*3/uL (ref 0.0–0.1)
EOS%: 13.7 % — AB (ref 0.0–7.0)
Eosinophils Absolute: 1.1 10*3/uL — ABNORMAL HIGH (ref 0.0–0.5)
HCT: 33 % — ABNORMAL LOW (ref 34.8–46.6)
HEMOGLOBIN: 10.9 g/dL — AB (ref 11.6–15.9)
LYMPH%: 24.7 % (ref 14.0–49.7)
MCH: 30.9 pg (ref 25.1–34.0)
MCHC: 33 g/dL (ref 31.5–36.0)
MCV: 93.5 fL (ref 79.5–101.0)
MONO#: 0.6 10*3/uL (ref 0.1–0.9)
MONO%: 8 % (ref 0.0–14.0)
NEUT#: 4.2 10*3/uL (ref 1.5–6.5)
NEUT%: 53 % (ref 38.4–76.8)
Platelets: 326 10*3/uL (ref 145–400)
RBC: 3.53 10*6/uL — AB (ref 3.70–5.45)
RDW: 12.6 % (ref 11.2–14.5)
WBC: 8 10*3/uL (ref 3.9–10.3)
lymph#: 2 10*3/uL (ref 0.9–3.3)

## 2015-09-24 MED ORDER — SODIUM CHLORIDE 0.9 % IV SOLN
Freq: Once | INTRAVENOUS | Status: AC
  Administered 2015-09-24: 12:00:00 via INTRAVENOUS

## 2015-09-24 MED ORDER — SODIUM CHLORIDE 0.9 % IV SOLN
240.0000 mg | Freq: Once | INTRAVENOUS | Status: AC
  Administered 2015-09-24: 240 mg via INTRAVENOUS
  Filled 2015-09-24: qty 20

## 2015-09-24 MED ORDER — SODIUM CHLORIDE 0.9% FLUSH
10.0000 mL | INTRAVENOUS | Status: DC | PRN
  Filled 2015-09-24: qty 10

## 2015-09-24 NOTE — Telephone Encounter (Signed)
Gave and printed appt sched and avs for pt for June and July  °

## 2015-09-24 NOTE — Patient Instructions (Signed)
[facility] Discharge Instructions for Patients Receiving Chemotherapy  Today you received the following chemotherapy agents: Nivolumab.  To help prevent nausea and vomiting after your treatment, we encourage you to take your nausea medication as directed.     If you develop nausea and vomiting that is not controlled by your nausea medication, call the clinic.   BELOW ARE SYMPTOMS THAT SHOULD BE REPORTED IMMEDIATELY:  *FEVER GREATER THAN 100.5 F  *CHILLS WITH OR WITHOUT FEVER  NAUSEA AND VOMITING THAT IS NOT CONTROLLED WITH YOUR NAUSEA MEDICATION  *UNUSUAL SHORTNESS OF BREATH  *UNUSUAL BRUISING OR BLEEDING  TENDERNESS IN MOUTH AND THROAT WITH OR WITHOUT PRESENCE OF ULCERS  *URINARY PROBLEMS  *BOWEL PROBLEMS  UNUSUAL RASH Items with * indicate a potential emergency and should be followed up as soon as possible.  Feel free to call the clinic you have any questions or concerns. The clinic phone number is [phone].  Please show the [facility] at check-in to the Emergency Department and triage nurse.    Nivolumab injection What is this medicine? NIVOLUMAB (nye VOL ue mab) is a monoclonal antibody. It is used to treat melanoma, lung cancer, kidney cancer, and Hodgkin lymphoma. This medicine may be used for other purposes; ask your health care provider or pharmacist if you have questions. What should I tell my health care provider before I take this medicine? They need to know if you have any of these conditions: -diabetes -immune system problems -kidney disease -liver disease -lung disease -organ transplant -stomach or intestine problems -thyroid disease -an unusual or allergic reaction to nivolumab, other medicines, foods, dyes, or preservatives -pregnant or trying to get pregnant -breast-feeding How should I use this medicine? This medicine is for infusion into a vein. It is given by a health care professional in a hospital or clinic  setting. A special MedGuide will be given to you before each treatment. Be sure to read this information carefully each time. Talk to your pediatrician regarding the use of this medicine in children. Special care may be needed. Overdosage: If you think you have taken too much of this medicine contact a poison control center or emergency room at once. NOTE: This medicine is only for you. Do not share this medicine with others. What if I miss a dose? It is important not to miss your dose. Call your doctor or health care professional if you are unable to keep an appointment. What may interact with this medicine? Interactions have not been studied. Give your health care provider a list of all the medicines, herbs, non-prescription drugs, or dietary supplements you use. Also tell them if you smoke, drink alcohol, or use illegal drugs. Some items may interact with your medicine. This list may not describe all possible interactions. Give your health care provider a list of all the medicines, herbs, non-prescription drugs, or dietary supplements you use. Also tell them if you smoke, drink alcohol, or use illegal drugs. Some items may interact with your medicine. What should I watch for while using this medicine? This drug may make you feel generally unwell. Continue your course of treatment even though you feel ill unless your doctor tells you to stop. You may need blood work done while you are taking this medicine. Do not become pregnant while taking this medicine or for 5 months after stopping it. Women should inform their doctor if they wish to become pregnant or think they might be pregnant. There is a potential for serious side  effects to an unborn child. Talk to your health care professional or pharmacist for more information. Do not breast-feed an infant while taking this medicine. What side effects may I notice from receiving this medicine? Side effects that you should report to your doctor or health  care professional as soon as possible: -allergic reactions like skin rash, itching or hives, swelling of the face, lips, or tongue -black, tarry stools -blood in the urine -bloody or watery diarrhea -changes in vision -change in sex drive -changes in emotions or moods -chest pain -confusion -cough -decreased appetite -diarrhea -facial flushing -feeling faint or lightheaded -fever, chills -hair loss -hallucination, loss of contact with reality -headache -irritable -joint pain -loss of memory -muscle pain -muscle weakness -seizures -shortness of breath -signs and symptoms of high blood sugar such as dizziness; dry mouth; dry skin; fruity breath; nausea; stomach pain; increased hunger or thirst; increased urination -signs and symptoms of kidney injury like trouble passing urine or change in the amount of urine -signs and symptoms of liver injury like dark yellow or brown urine; general ill feeling or flu-like symptoms; light-colored stools; loss of appetite; nausea; right upper belly pain; unusually weak or tired; yellowing of the eyes or skin -stiff neck -swelling of the ankles, feet, hands -weight gain Side effects that usually do not require medical attention (report to your doctor or health care professional if they continue or are bothersome): -bone pain -constipation -tiredness -vomiting This list may not describe all possible side effects. Call your doctor for medical advice about side effects. You may report side effects to FDA at 1-800-FDA-1088. Where should I keep my medicine? This drug is given in a hospital or clinic and will not be stored at home. NOTE: This sheet is a summary. It may not cover all possible information. If you have questions about this medicine, talk to your doctor, pharmacist, or health care provider.    2016, [employer]. (2014-08-28 10:03:42)

## 2015-09-24 NOTE — Progress Notes (Signed)
[place] [phone]   [phone]  OFFICE PROGRESS NOTE  GREEN, [name], MD [address] [place] [address]  DIAGNOSIS: Progressive non-small cell lung cancer initially diagnosed as Stage IIB/IIIA non-small cell lung cancer consistent with adenocarcinoma with negative EGFR mutation and negative ALK gene translocation diagnosed in December 2012.   PRIOR THERAPY:  1) Status post radiation therapy to the right upper lobe lung mass completed on 06/04/2011 under the care of Dr. Pablo Ledger.  2) Systemic chemotherapy with carboplatin for AUC of 5 and Alimta 500 mg/M2 giving every 3 weeks, status post 3 cycles, last dose was given on 07/21/2011.  3) Systemic chemotherapy with carboplatin for AUC of 5 and Alimta 500 MG/M2 every 3 weeks. First dose on 01/02/2015. Status post 2 cycles, last dose was given 01/30/2015. Her treatment is currently on hold secondary to intolerance.  CURRENT THERAPY: Immunotherapy with Nivolumab 240 MG every 2 weeks. First dose 08/27/2015. Status post 2 cycles  INTERVAL HISTORY: [name] 70 y.o. female returns to the clinic today for follow up visit. She is currently on treatment with immunotherapy with Nivolumab status post 2 cycles. She is tolerating her treatment fairly well with no significant adverse effect except for 2 spots of the skin rash on the left arm. She is feeling fine today except for the baseline shortness of breath. She still on home oxygen 2 L/m. She denied having any significant weight loss or night sweats. She has no nausea or vomiting, no fever or chills. She is here today to start cycle #3.  MEDICAL HISTORY: Past Medical History  Diagnosis Date  . 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 Restless legs syndrome (RLS)   . Chronic sinusitis   . Hypertension   . Hyperlipidemia   . Anemia, unspecified   . GERD (gastroesophageal reflux disease)   . Respiratory failure with hypoxia 01/02/2010  .  Complication of anesthesia     slow to wake up  . Asthma   . Coronary artery disease     Dr. Burt Knack had stress test done  . Peripheral vascular disease ([place])   . PAD (peripheral artery disease) ([place])   . Heart murmur   . Dysrhythmia     irregular  . Anginal pain ([place])   . History of blood clots     "2 in my aorta"  . Hard of hearing, right   . History of bronchitis   . Bronchial pneumonia     history  . COPD (chronic obstructive pulmonary disease) ([place])   . Emphysema   . Shortness of breath     "anytime really"  . Syncope and collapse   . Chemotherapy adverse reaction 09/15/11    "makes me light headed and pass out; this is 3rd blood transfusion since going thru it"  . History of blood transfusion   . Kidney infection     "related to chemo"  . Sinus headache     "all the time"  . Arthritis     "all over my body"  . Fibromyalgia   . Osteoporosis   . DDD (degenerative disc disease), lumbar   . Scoliosis   . Anxiety   . Mental disorder   . Reflux   . CHF (congestive heart failure) ([place])   . DVT (deep venous thrombosis) ([place])   . Hx of radiation therapy 04/28/11 -06/08/11    right upper lobe-lung  . Acute upper respiratory infections of unspecified site   .  Pain in thoracic spine   . Asymptomatic varicose veins   . Functional urinary incontinence   . Diseases of lips   . Other abnormal blood chemistry   . Effects of radiation, unspecified   . Anemia in neoplastic disease   . Muscle weakness (generalized)   . Dizziness and giddiness   . Spasm of muscle   . Unspecified vitamin D deficiency   . Hyperpotassemia   . Acute posthemorrhagic anemia   . Unspecified hearing loss   . Disorder of bone and cartilage, unspecified   . Screening for thyroid disorder   . Hypopotassemia   . Atherosclerosis of native arteries of the extremities, unspecified   . Reflux esophagitis   . Unspecified constipation   . Unspecified hereditary and idiopathic peripheral neuropathy   .  Urinary tract infection, site not specified   . Muscle weakness (generalized)   . Acute sinusitis, unspecified   . Herpes zoster without mention of complication   . 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 Other and unspecified hyperlipidemia   . Anxiety state, unspecified   . Depressive disorder, not elsewhere classified   . Unspecified essential hypertension   . Atrial fibrillation ([place])   . Chronic airway obstruction, not elsewhere classified   . Osteoarthrosis, unspecified whether generalized or localized, unspecified site   . Lumbago   . Hip pain, chronic 09/17/2014  . Type II or unspecified type diabetes mellitus without mention of complication, not stated as uncontrolled   . Type II or unspecified type diabetes mellitus with neurological manifestations, uncontrolled   . Type II or unspecified type diabetes mellitus without mention of complication, not stated as uncontrolled   . Adenocarcinoma, lung ([place])     upper left lobe  . Non-small cell lung cancer ([place]) 03/31/2011    Adenocarcinoma Rt upper lobe mass  . Malignant neoplasm of bronchus and lung, unspecified site   . Primary cancer of right upper lobe of lung ([place]) 02/08/2011    Biopsy Rt upper lobe mass 03/31/11>>adenocarcinoma DIAGNOSIS: Stage IIB/IIIA non-small cell lung cancer consistent with adenocarcinoma diagnosed in December 2012.   PRIOR THERAPY:  1) Status post radiation therapy to the right upper lobe lung mass completed on 06/04/2011 under the care of Dr. Pablo Ledger.  2) Systemic chemotherapy with carboplatin for AUC of 5 and Alimta 500 mg/M2 giving every 3   . Encounter for antineoplastic immunotherapy 08/19/2015  . Chronic fatigue 08/19/2015    ALLERGIES:  is allergic to iohexol; other; anoro ellipta; codeine; and protonix.  MEDICATIONS:  Current Outpatient Prescriptions  Medication Sig Dispense Refill  . albuterol (PROVENTIL HFA;VENTOLIN HFA) 108 (90 Base) MCG/ACT inhaler Inhale 2 puffs into the lungs every 4  (four) hours as needed for wheezing or shortness of breath. 1 Inhaler 2  . albuterol-ipratropium (COMBIVENT) 18-103 MCG/ACT inhaler Inhale 2 puffs into the lungs every 4 (four) hours as needed (lung cancer).     . ALPRAZolam (XANAX) 1 MG tablet     . AMBULATORY NON FORMULARY MEDICATION Lighter Weight Rollator with Padded Seat and Hand Brakes Dx: 496, 729.1, 722.52 1 each 0  . AMBULATORY NON FORMULARY MEDICATION Raised Commode Seat with Rail Dx: M51.36, M54.5 1 each 0  . aspirin 81 MG tablet Take 81 mg by mouth daily.    . citalopram (CELEXA) 20 MG tablet TAKE 1 TABLET BY MOUTH EVERY DAY TO HELP MOOD 90 tablet 0  . COMBIVENT RESPIMAT 20-100 MCG/ACT AERS respimat INHALE 1 PUFF BY MOUTH EVERY 6  HOURS AS NEEDED FOR SHORTNESS OF BREATH (Patient taking differently: 1 puff by mouth every 6 hours as ne) 12 Inhaler 3  . cyproheptadine (PERIACTIN) 4 MG tablet Take 1 tablet (4 mg total) by mouth 2 (two) times daily. For itching 30 tablet 0  . diltiazem (CARDIZEM CD) 240 MG 24 hr capsule TAKE 1 CAPSULE BY MOUTH EVERY DAY FOR BLOOD PRESSURE. 30 capsule 1  . diphenhydrAMINE (BENADRYL) 25 mg capsule Take 25 mg by mouth every 6 (six) hours as needed.    . fluticasone (FLONASE) 50 MCG/ACT nasal spray INSTILL 2 SPRAYS INTO BOTH NOSTRILS  TWICE DAILY *NEEDS APPOINTMENT* 16 g 5  . gemfibrozil (LOPID) 600 MG tablet TAKE 1 TABLET BY MOUTH ONCE DAILY 30 tablet 3  . glucose blood (FREESTYLE LITE) test strip Use to test blood sugar three times daily. Dx: E11.9, E11.49 100 each 12  . HYDROcodone-acetaminophen (NORCO) 10-325 MG tablet Take One tablet by mouth every 6 hours if needed for pain 120 tablet 0  . HYDROcodone-homatropine (HYCODAN) 5-1.5 MG/5ML syrup Take 5 mLs by mouth every 6 (six) hours as needed for cough. 120 mL 0  . Lancets (FREESTYLE) lancets Use to test blood sugar three times daily. Dx: E11.9, E11.49 100 each 12  . loratadine (CLARITIN) 10 MG tablet TAKE 1 TABLET BY MOUTH EVERY DAY 30 tablet 3  . losartan  (COZAAR) 50 MG tablet TAKE 1 TABLET BY MOUTH TWICE DAILY 60 tablet 5  . metFORMIN (GLUCOPHAGE) 500 MG tablet TAKE 1 TABLET BY MOUTH TWICE DAILY TO CONTROL BLOOD SUGAR. 60 tablet 1  . metoCLOPramide (REGLAN) 5 MG tablet Take 5 mg by mouth 4 (four) times daily.    . metoCLOPramide (REGLAN) 5 MG tablet TAKE 1 TABLET BY MOUTH 4 TIMES A DAY. 120 tablet 1  . Multiple Vitamins-Minerals (CENTRUM SILVER PO) Take 1 tablet by mouth daily. Patient unsure of dose    . NON FORMULARY Place 3 L into the nose daily. Oxygen    . potassium chloride (K-DUR,KLOR-CON) 10 MEQ tablet TAKE 1 TABLET BY MOUTH EVERY DAY 30 tablet 5  . traMADol (ULTRAM) 50 MG tablet TAKE 1 TABLET BY MOUTH THREE TIMES A DAY AS NEEDED FOR PAIN 90 tablet 5  . triamterene-hydrochlorothiazide (MAXZIDE-25) 37.5-25 MG tablet TAKE 1 TABLET BY MOUTH EVERY DAY TO CONTROL BLOOD PRESSURE 90 tablet 2  . [DISCONTINUED] calcium carbonate 200 MG capsule Take 250 mg by mouth daily. Does not take everyday     No current facility-administered medications for this visit.   Facility-Administered Medications Ordered in Other Visits  Medication Dose Route Frequency Provider Last Rate Last Dose  . hyaluronate sodium (RADIAPLEXRX) gel   Topical Once Thea Silversmith, MD      . topical emolient (BIAFINE) emulsion   Topical PRN Thea Silversmith, MD        SURGICAL HISTORY:  Past Surgical History  Procedure Laterality Date  . Lung lobectomy  1986    left  . Septoplasty    . Total hip arthroplasty  01/2010    left  . Bronchoscopy  02/2011  . Dilation and curettage of uterus  1980's  . Tubal ligation  1980's  . Cardiac catheterization    . Fetal blood transfusion  09/16/11  . Joint replacement  04/13/10    Left Hip  . Cataract extraction  03/12/2013    Left Eye   . Eye surgery Left 2014    Cataract    REVIEW OF SYSTEMS:  A comprehensive review of systems  was negative except for: Respiratory: positive for dyspnea on exertion Integument/breast: positive for  rash   PHYSICAL EXAMINATION: General appearance: alert, cooperative, fatigued and no distress Head: Normocephalic, without obvious abnormality, atraumatic Neck: no adenopathy, no JVD, supple, symmetrical, trachea midline and thyroid not enlarged, symmetric, no tenderness/mass/nodules Lymph nodes: Cervical, supraclavicular, and axillary nodes normal. Resp: clear to auscultation bilaterally Back: symmetric, no curvature. ROM normal. No CVA tenderness. Cardio: regular rate and rhythm, S1, S2 normal, no murmur, click, rub or gallop GI: soft, non-tender; bowel sounds normal; no masses,  no organomegaly Extremities: extremities normal, atraumatic, no cyanosis or edema Neurologic: Alert and oriented X 3, normal strength and tone. Normal symmetric reflexes. Normal coordination and gait  ECOG PERFORMANCE STATUS: 1 - Symptomatic but completely ambulatory  Blood pressure 148/57, pulse 96, temperature 98.3 F (36.8 C), temperature source Oral, resp. rate 18, height 5' 6"  (1.676 m), weight 165 lb 8 oz (75.07 kg), SpO2 100 %.  LABORATORY DATA: Lab Results  Component Value Date   WBC 8.0 09/24/2015   HGB 10.9* 09/24/2015   HCT 33.0* 09/24/2015   MCV 93.5 09/24/2015   PLT 326 09/24/2015      Chemistry      Component Value Date/Time   NA 142 09/10/2015 1131   NA 139 08/06/2015 1036   NA 138 04/15/2015 1455   NA 142 10/25/2011 1200   K 3.7 09/10/2015 1131   K 3.9 08/06/2015 1036   K 4.6 10/25/2011 1200   CL 97 08/06/2015 1036   CL 99 07/21/2012 1125   CL 97* 10/25/2011 1200   CO2 25 09/10/2015 1131   CO2 23 08/06/2015 1036   CO2 24 10/25/2011 1200   BUN 16.0 09/10/2015 1131   BUN 19 08/06/2015 1036   BUN 16 04/15/2015 1455   BUN 15 10/25/2011 1200   CREATININE 1.2* 09/10/2015 1131   CREATININE 1.29* 08/06/2015 1036   CREATININE 0.8 10/25/2011 1200      Component Value Date/Time   CALCIUM 10.1 09/10/2015 1131   CALCIUM 9.9 08/06/2015 1036   CALCIUM 9.6 10/25/2011 1200   ALKPHOS  109 09/10/2015 1131   ALKPHOS 118* 08/06/2015 1036   ALKPHOS 106* 10/25/2011 1200   AST 16 09/10/2015 1131   AST 22 08/06/2015 1036   AST 25 10/25/2011 1200   ALT 11 09/10/2015 1131   ALT 10 08/06/2015 1036   ALT 14 10/25/2011 1200   BILITOT 0.45 09/10/2015 1131   BILITOT 0.3 08/06/2015 1036   BILITOT 0.9 04/15/2015 1455   BILITOT 0.60 10/25/2011 1200       RADIOGRAPHIC STUDIES: No results found. ASSESSMENT AND PLAN: this is a very pleasant 70 years old white female with history of stage IIB/IIIA non-small cell lung cancer status post concurrent chemoradiation followed by consolidation chemotherapy with carboplatin and Alimta and has been observation since April 2013. She has no evidence for disease progression on his recent scan except for a continuous enlargement of the right upper lobe opacity. The molecular study for EGFR mutation was reported to be negative. The patient was started on treatment with systemic chemotherapy with carboplatin and Alimta status post 2 cycles but her treatment has been on hold secondary to increasing fatigue and weakness as well as pancytopenia. The restaging scan of the chest performed on 04/07/2016 showed mild improvement of her disease. I discussed the scan results with the patient and her daughter. I recommended for her to continue on observation for now with repeat CT scan of the chest in 3  months for restaging of her disease. The CT scan of the chest performed on 08/12/2015 showed evidence for disease progression in the right lung. The patient is currently on treatment with immunotherapy with Nivolumab status post 2 cycles and tolerating her treatment well. I recommended for her to proceed with cycle #3 today as a scheduled. I will see her back for follow-up visit in 2 weeks for evaluation with the start of cycle #3. She was advised to call immediately if she has any concerning symptoms in the interval. The patient voices understanding of current  disease status and treatment options and is in agreement with the current care plan.  All questions were answered. The patient knows to call the clinic with any problems, questions or concerns. We can certainly see the patient much sooner if necessary.  Disclaimer: This note was dictated with voice recognition software. Similar sounding words can inadvertently be transcribed and may not be corrected upon review.

## 2015-09-25 ENCOUNTER — Telehealth: Payer: Self-pay | Admitting: *Deleted

## 2015-09-25 NOTE — Telephone Encounter (Signed)
Received fax from [place] [phone]  informing they have APPROVED coverage of Cyproheptadine HCL, Referral #: [id number] Coverage date 04/11/15-04/11/16 Member ID#: [id number]

## 2015-10-02 ENCOUNTER — Other Ambulatory Visit: Payer: Self-pay | Admitting: Internal Medicine

## 2015-10-03 ENCOUNTER — Other Ambulatory Visit: Payer: Self-pay | Admitting: *Deleted

## 2015-10-03 DIAGNOSIS — G8929 Other chronic pain: Secondary | ICD-10-CM

## 2015-10-03 DIAGNOSIS — M25552 Pain in left hip: Secondary | ICD-10-CM

## 2015-10-03 DIAGNOSIS — M5136 Other intervertebral disc degeneration, lumbar region: Secondary | ICD-10-CM

## 2015-10-03 DIAGNOSIS — G44229 Chronic tension-type headache, not intractable: Secondary | ICD-10-CM

## 2015-10-03 MED ORDER — HYDROCODONE-ACETAMINOPHEN 10-325 MG PO TABS: ORAL_TABLET | ORAL | Status: DC

## 2015-10-03 NOTE — Telephone Encounter (Signed)
Patient requested and will pick up 

## 2015-10-07 ENCOUNTER — Ambulatory Visit (INDEPENDENT_AMBULATORY_CARE_PROVIDER_SITE_OTHER): Payer: Medicare Other | Admitting: Emergency Medicine

## 2015-10-07 ENCOUNTER — Encounter: Payer: Self-pay | Admitting: Emergency Medicine

## 2015-10-07 VITALS — BP 124/60 | HR 93 | Ht 66.0 in | Wt 167.0 lb

## 2015-10-07 DIAGNOSIS — C3411 Malignant neoplasm of upper lobe, right bronchus or lung: Secondary | ICD-10-CM

## 2015-10-07 DIAGNOSIS — J449 Chronic obstructive pulmonary disease, unspecified: Secondary | ICD-10-CM | POA: Diagnosis not present

## 2015-10-07 NOTE — Assessment & Plan Note (Signed)
She's had progression of her symptoms and dyspnea, I believe that she needs to be on a more reliable scheduled long-acting bronchodilator. I will change the Combivent to Anoro qd today, continue prn albuterol and follow her progress.   Please stop combivent for now Please start Anoro 1 inhalation daily, every day Use ventolin 2 puffs up to every 4 hours if needed for shortness of breath.  Continue your oxygen at 3-4L/min Follow with Dr Lamonte Sakai next available to discuss whether you have benefited from the [facility].

## 2015-10-07 NOTE — Assessment & Plan Note (Signed)
Currently on immunotherapy. She will follow-up with [name].

## 2015-10-07 NOTE — Progress Notes (Signed)
Subjective:    Patient ID: [name], female    DOB: 1945-04-19, 70 y.o.   MRN: [id number] HPI History of Present Illness:  [name] is a 70 y.o. 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 Has received XRT and chemo with Drs Pablo Ledger and Julien Nordmann.  She tolerated XRT completely. She has completed 3 cycles chemo.  Currently managed on combivent qid, started O2.   ROV 08/01/14 -- follow-up visit for COPD and adenocarcinoma of the long in the right upper lobe. She underwent chemotherapy and radiation therapy and is currently under observation.  Her last CT scan was 07/29/14. There was some possible increase in groundglass the right upper lung zone. She c/o being more weak, more DOE. She is more fatigued. Her o2 is set on 2L/min, occasionally to 3L/min.   ROV 04/24/15 -- follow up visit for COPD and RUL adenoCA. Her last chemotherapy was in November '16, she hasn't had any since due to side effects and fatigue. Most recent CT was 12/27 which I have personally reviewed. Shows interval decrease in size of her RML GG nodule. She has felt worse for the last 3 weeks. She has had cough, rhinorrhea, congestion, subjective fever, dizziness, upset stomach and felt bad for 3 weeks. She was seen in the ED 1/3, treated with azithro. No improvement. She is taking combivent prn, about 2x a day. She started mucinex yesterday.   ROV 10/07/15 -- patient with a history of COPD, right upper lobe adenocarcinoma stage IIB/IIIa she has some enlargement of her right upper lobe opacity. She has been on immunotherapy with Nivolumab, last dose was 2 weeks ago. She is having some itching, not sure whether associated with the therapy. She will likely have another CT chest in the next 2 months. She is having continued chest tightness. More SOB with exertion. She is using combivent prn, usually tid. She is more SOB at night, hears wheezing.      Objective:   Physical Exam Filed Vitals:   10/07/15  1436 10/07/15 1437  BP:  124/60  Pulse:  93  Height: '5\' 6"'$  (1.676 m)   Weight: 167 lb (75.751 kg)   SpO2:  97%   Gen: Pleasant, elderly woman, in no distress,  normal affect  ENT: No lesions,  mouth clear,  oropharynx clear, no postnasal drip  Neck: No JVD, no TMG, no carotid bruits  Lungs: No use of accessory muscles, distant, no wheeze  Cardiovascular: RRR, heart sounds normal, no murmur or gallops, no peripheral edema  Musculoskeletal: No deformities, no cyanosis or clubbing. Tender to palp of the R pectoral area  Neuro: alert, non focal  Skin: Warm, no lesions or rashes  CBC Latest Ref Rng 09/24/2015 09/10/2015 08/27/2015  WBC 3.9 - 10.3 10e3/uL 8.0 8.7 8.3  Hemoglobin 11.6 - 15.9 g/dL 10.9(L) 11.9 11.6  Hematocrit 34.8 - 46.6 % 33.0(L) 35.5 35.3  Platelets 145 - 400 10e3/uL 326 309 315   BMET    Component Value Date/Time   NA 141 09/24/2015 1101   NA 139 08/06/2015 1036   NA 138 04/15/2015 1455   NA 142 10/25/2011 1200   K 3.8 09/24/2015 1101   K 3.9 08/06/2015 1036   K 4.6 10/25/2011 1200   CL 97 08/06/2015 1036   CL 99 07/21/2012 1125   CL 97* 10/25/2011 1200   CO2 26 09/24/2015 1101   CO2 23 08/06/2015 1036   CO2 24 10/25/2011 1200   GLUCOSE 95  09/24/2015 1101   GLUCOSE 82 08/06/2015 1036   GLUCOSE 169* 04/15/2015 1455   GLUCOSE 184* 07/21/2012 1125   GLUCOSE 167* 10/25/2011 1200   BUN 17.1 09/24/2015 1101   BUN 19 08/06/2015 1036   BUN 16 04/15/2015 1455   BUN 15 10/25/2011 1200   CREATININE 1.1 09/24/2015 1101   CREATININE 1.29* 08/06/2015 1036   CREATININE 0.8 10/25/2011 1200   CALCIUM 10.0 09/24/2015 1101   CALCIUM 9.9 08/06/2015 1036   CALCIUM 9.6 10/25/2011 1200   GFRNONAA 42* 08/06/2015 1036   GFRAA 48* 08/06/2015 1036     04/08/15 --  CLINICAL DATA: 70 year old female with history of lung cancer diagnosed in 2012 status post chemotherapy and radiation therapy which are now complete. Additional remote history of lung cancer diagnosed  in 1986 status post surgical resection at that time. Chest pain, cough and shortness of breath for 1 year.  EXAM: CT CHEST WITHOUT CONTRAST  TECHNIQUE: Multidetector CT imaging of the chest was performed following the standard protocol without IV contrast.  COMPARISON: Chest CT 12/02/2014.  FINDINGS: Mediastinum/Lymph Nodes: Heart size is normal. There is no significant pericardial fluid, thickening or pericardial calcification. There is atherosclerosis of the thoracic aorta, the great vessels of the mediastinum and the coronary arteries, including calcified atherosclerotic plaque in the left anterior descending and left circumflex coronary arteries. No pathologically enlarged mediastinal or hilar lymph nodes. Please note that accurate exclusion of hilar adenopathy is limited on noncontrast CT scans. Esophagus is unremarkable in appearance. No axillary lymphadenopathy.  Lungs/Pleura: Postoperative changes of left upper lobectomy again noted, with compensatory hyperexpansion of the left lower lobe. Chronic collapse of the right upper lobe is redemonstrated. Again noted is a mass-like area of ground-glass attenuation in the hyperexpanded right middle lobe (previously referred to as right "upper" lobe), which appears slightly smaller and slightly less dense when compared to the prior study, currently measuring 4.2 x 6.8 cm (image 26 of series 5), previously 4.6 x 7.4 cm on 12/02/2014. Several other smaller pulmonary nodules and areas of ground-glass attenuation are again noted, and are either stable in size or appear slightly smaller than the prior examination. Specific examples include a stable 8 mm left lower lobe nodule (image 26 of series 5), and a 6 mm ground-glass attenuation nodule in the periphery of the right lower lobe associated with the minor fissure (image 30 of series 5) which previously measured 10 mm. Mild diffuse bronchial wall thickening with mild  centrilobular and paraseptal emphysema. Small amount of chronic pleural thickening in the posterior aspect of the right hemithorax, similar to prior studies. Previously noted trace right pleural effusion has resolved.  Upper Abdomen: Calcified gallstones in the gallbladder measuring up to 7 mm in the neck. Atherosclerosis. 14 mm low-attenuation (-30 HU) left adrenal nodule compatible with an adenoma. 17 mm low-attenuation (-27 HU) right adrenal nodule also compatible with an adenoma.  Musculoskeletal/Soft Tissues: Old compression fractures of T4 and T5 again noted approximately with 30% loss of central vertebral body height at both levels, and post vertebroplasty changes in the left side of the T4 vertebral body. Healing fracture of the posterior aspect of the right fifth rib again noted. There are no aggressive appearing lytic or blastic lesions noted in the visualized portions of the skeleton.  IMPRESSION: 1. Overall, today's study demonstrates a positive response to therapy with decreased size of the large ground-glass attenuation lesion in the right middle lobe, stable to slightly decreased size of other previously noted pulmonary nodules, as  detailed above. 2. Trace right-sided pleural effusion has also resolved, and the right-sided pleural thickening is stable compared to prior examinations. 3. Status post left upper lobectomy. 4. Complete chronic collapse of the right upper lobe, similar to prior examinations. 5. Atherosclerosis, including 2 vessel coronary artery disease. Assessment for potential risk factor modification, dietary therapy or pharmacologic therapy may be warranted, if clinically indicated.       Assessment & Plan:  COPD (chronic obstructive pulmonary disease) (HCC) She's had progression of her symptoms and dyspnea, I believe that she needs to be on a more reliable scheduled long-acting bronchodilator. I will change the Combivent to Anoro qd today,  continue prn albuterol and follow her progress.   Please stop combivent for now Please start Anoro 1 inhalation daily, every day Use ventolin 2 puffs up to every 4 hours if needed for shortness of breath.  Continue your oxygen at 3-4L/min Follow with Dr Lamonte Sakai next available to discuss whether you have benefited from the [facility].   Primary cancer of right upper lobe of lung ([place]) Currently on immunotherapy. She will follow-up with [name].   Baltazar Apo, MD, PhD 10/07/2015, 3:04 PM [facility] [phone] or if no answer [phone]

## 2015-10-07 NOTE — Patient Instructions (Signed)
Please stop combivent for now Please start Anoro 1 inhalation daily, every day Use ventolin 2 puffs up to every 4 hours if needed for shortness of breath.  Continue your oxygen at 3-4L/min Follow with Dr Julien Nordmann as planned.  Follow with Dr Lamonte Sakai next available to discuss whether you have benefited from the [facility]

## 2015-10-08 ENCOUNTER — Other Ambulatory Visit (HOSPITAL_BASED_OUTPATIENT_CLINIC_OR_DEPARTMENT_OTHER): Payer: Medicare Other

## 2015-10-08 ENCOUNTER — Ambulatory Visit (HOSPITAL_BASED_OUTPATIENT_CLINIC_OR_DEPARTMENT_OTHER): Payer: Medicare Other | Admitting: Internal Medicine

## 2015-10-08 ENCOUNTER — Ambulatory Visit (HOSPITAL_BASED_OUTPATIENT_CLINIC_OR_DEPARTMENT_OTHER): Payer: Medicare Other

## 2015-10-08 ENCOUNTER — Encounter: Payer: Self-pay | Admitting: Internal Medicine

## 2015-10-08 ENCOUNTER — Telehealth: Payer: Self-pay | Admitting: Internal Medicine

## 2015-10-08 ENCOUNTER — Encounter: Payer: Self-pay | Admitting: *Deleted

## 2015-10-08 VITALS — BP 146/53 | HR 96 | Temp 98.7°F | Resp 20 | Ht 66.0 in | Wt 168.3 lb

## 2015-10-08 DIAGNOSIS — C3412 Malignant neoplasm of upper lobe, left bronchus or lung: Secondary | ICD-10-CM

## 2015-10-08 DIAGNOSIS — Z5112 Encounter for antineoplastic immunotherapy: Secondary | ICD-10-CM

## 2015-10-08 DIAGNOSIS — C3411 Malignant neoplasm of upper lobe, right bronchus or lung: Secondary | ICD-10-CM

## 2015-10-08 DIAGNOSIS — R5383 Other fatigue: Secondary | ICD-10-CM | POA: Diagnosis not present

## 2015-10-08 DIAGNOSIS — R531 Weakness: Secondary | ICD-10-CM | POA: Diagnosis not present

## 2015-10-08 DIAGNOSIS — D61818 Other pancytopenia: Secondary | ICD-10-CM | POA: Diagnosis not present

## 2015-10-08 DIAGNOSIS — C3491 Malignant neoplasm of unspecified part of right bronchus or lung: Secondary | ICD-10-CM

## 2015-10-08 LAB — COMPREHENSIVE METABOLIC PANEL
ALBUMIN: 3.9 g/dL (ref 3.5–5.0)
ALK PHOS: 121 U/L (ref 40–150)
ALT: 9 U/L (ref 0–55)
AST: 17 U/L (ref 5–34)
Anion Gap: 12 mEq/L — ABNORMAL HIGH (ref 3–11)
BUN: 22.4 mg/dL (ref 7.0–26.0)
CALCIUM: 9.7 mg/dL (ref 8.4–10.4)
CO2: 25 mEq/L (ref 22–29)
Chloride: 104 mEq/L (ref 98–109)
Creatinine: 1.2 mg/dL — ABNORMAL HIGH (ref 0.6–1.1)
EGFR: 45 mL/min/{1.73_m2} — AB (ref >90)
Glucose: 83 mg/dl (ref 70–140)
POTASSIUM: 4.1 meq/L (ref 3.5–5.1)
Sodium: 140 mEq/L (ref 136–145)
Total Bilirubin: <0.3 mg/dL (ref 0.20–1.20)
Total Protein: 7.2 g/dL (ref 6.4–8.3)

## 2015-10-08 LAB — CBC WITH DIFFERENTIAL/PLATELET
BASO%: 0.7 % (ref 0.0–2.0)
BASOS ABS: 0.1 10*3/uL (ref 0.0–0.1)
EOS ABS: 1.5 10*3/uL — AB (ref 0.0–0.5)
EOS%: 18.2 % — ABNORMAL HIGH (ref 0.0–7.0)
HEMATOCRIT: 32.5 % — AB (ref 34.8–46.6)
HEMOGLOBIN: 10.8 g/dL — AB (ref 11.6–15.9)
LYMPH#: 2.1 10*3/uL (ref 0.9–3.3)
LYMPH%: 24.8 % (ref 14.0–49.7)
MCH: 31 pg (ref 25.1–34.0)
MCHC: 33.2 g/dL (ref 31.5–36.0)
MCV: 93.4 fL (ref 79.5–101.0)
MONO#: 0.6 10*3/uL (ref 0.1–0.9)
MONO%: 7.2 % (ref 0.0–14.0)
NEUT#: 4.1 10*3/uL (ref 1.5–6.5)
NEUT%: 49.1 % (ref 38.4–76.8)
PLATELETS: 324 10*3/uL (ref 145–400)
RBC: 3.48 10*6/uL — ABNORMAL LOW (ref 3.70–5.45)
RDW: 13 % (ref 11.2–14.5)
WBC: 8.4 10*3/uL (ref 3.9–10.3)

## 2015-10-08 MED ORDER — NIVOLUMAB CHEMO INJECTION 100 MG/10ML
240.0000 mg | Freq: Once | INTRAVENOUS | Status: AC
  Administered 2015-10-08: 240 mg via INTRAVENOUS
  Filled 2015-10-08: qty 20

## 2015-10-08 MED ORDER — SODIUM CHLORIDE 0.9 % IV SOLN
Freq: Once | INTRAVENOUS | Status: AC
  Administered 2015-10-08: 12:00:00 via INTRAVENOUS

## 2015-10-08 NOTE — Patient Instructions (Signed)
[facility] Discharge Instructions for Patients Receiving Chemotherapy  Today you received the following chemotherapy agents: Nivolumab.  To help prevent nausea and vomiting after your treatment, we encourage you to take your nausea medication as directed.     If you develop nausea and vomiting that is not controlled by your nausea medication, call the clinic.   BELOW ARE SYMPTOMS THAT SHOULD BE REPORTED IMMEDIATELY:  *FEVER GREATER THAN 100.5 F  *CHILLS WITH OR WITHOUT FEVER  NAUSEA AND VOMITING THAT IS NOT CONTROLLED WITH YOUR NAUSEA MEDICATION  *UNUSUAL SHORTNESS OF BREATH  *UNUSUAL BRUISING OR BLEEDING  TENDERNESS IN MOUTH AND THROAT WITH OR WITHOUT PRESENCE OF ULCERS  *URINARY PROBLEMS  *BOWEL PROBLEMS  UNUSUAL RASH Items with * indicate a potential emergency and should be followed up as soon as possible.  Feel free to call the clinic you have any questions or concerns. The clinic phone number is [phone].  Please show the [facility] at check-in to the Emergency Department and triage nurse.    Nivolumab injection What is this medicine? NIVOLUMAB (nye VOL ue mab) is a monoclonal antibody. It is used to treat melanoma, lung cancer, kidney cancer, and Hodgkin lymphoma. This medicine may be used for other purposes; ask your health care provider or pharmacist if you have questions. What should I tell my health care provider before I take this medicine? They need to know if you have any of these conditions: -diabetes -immune system problems -kidney disease -liver disease -lung disease -organ transplant -stomach or intestine problems -thyroid disease -an unusual or allergic reaction to nivolumab, other medicines, foods, dyes, or preservatives -pregnant or trying to get pregnant -breast-feeding How should I use this medicine? This medicine is for infusion into a vein. It is given by a health care professional in a hospital or clinic  setting. A special MedGuide will be given to you before each treatment. Be sure to read this information carefully each time. Talk to your pediatrician regarding the use of this medicine in children. Special care may be needed. Overdosage: If you think you have taken too much of this medicine contact a poison control center or emergency room at once. NOTE: This medicine is only for you. Do not share this medicine with others. What if I miss a dose? It is important not to miss your dose. Call your doctor or health care professional if you are unable to keep an appointment. What may interact with this medicine? Interactions have not been studied. Give your health care provider a list of all the medicines, herbs, non-prescription drugs, or dietary supplements you use. Also tell them if you smoke, drink alcohol, or use illegal drugs. Some items may interact with your medicine. This list may not describe all possible interactions. Give your health care provider a list of all the medicines, herbs, non-prescription drugs, or dietary supplements you use. Also tell them if you smoke, drink alcohol, or use illegal drugs. Some items may interact with your medicine. What should I watch for while using this medicine? This drug may make you feel generally unwell. Continue your course of treatment even though you feel ill unless your doctor tells you to stop. You may need blood work done while you are taking this medicine. Do not become pregnant while taking this medicine or for 5 months after stopping it. Women should inform their doctor if they wish to become pregnant or think they might be pregnant. There is a potential for serious side  effects to an unborn child. Talk to your health care professional or pharmacist for more information. Do not breast-feed an infant while taking this medicine. What side effects may I notice from receiving this medicine? Side effects that you should report to your doctor or health  care professional as soon as possible: -allergic reactions like skin rash, itching or hives, swelling of the face, lips, or tongue -black, tarry stools -blood in the urine -bloody or watery diarrhea -changes in vision -change in sex drive -changes in emotions or moods -chest pain -confusion -cough -decreased appetite -diarrhea -facial flushing -feeling faint or lightheaded -fever, chills -hair loss -hallucination, loss of contact with reality -headache -irritable -joint pain -loss of memory -muscle pain -muscle weakness -seizures -shortness of breath -signs and symptoms of high blood sugar such as dizziness; dry mouth; dry skin; fruity breath; nausea; stomach pain; increased hunger or thirst; increased urination -signs and symptoms of kidney injury like trouble passing urine or change in the amount of urine -signs and symptoms of liver injury like dark yellow or brown urine; general ill feeling or flu-like symptoms; light-colored stools; loss of appetite; nausea; right upper belly pain; unusually weak or tired; yellowing of the eyes or skin -stiff neck -swelling of the ankles, feet, hands -weight gain Side effects that usually do not require medical attention (report to your doctor or health care professional if they continue or are bothersome): -bone pain -constipation -tiredness -vomiting This list may not describe all possible side effects. Call your doctor for medical advice about side effects. You may report side effects to FDA at 1-800-FDA-1088. Where should I keep my medicine? This drug is given in a hospital or clinic and will not be stored at home. NOTE: This sheet is a summary. It may not cover all possible information. If you have questions about this medicine, talk to your doctor, pharmacist, or health care provider.    2016, Elsevier/Gold Standard. (2014-08-28 10:03:42)

## 2015-10-08 NOTE — Telephone Encounter (Signed)
per pof to sch pt appt-sch trmt-pt to get updated copy b4 leaving trmtroom

## 2015-10-08 NOTE — Progress Notes (Signed)
Oncology Nurse Navigator Documentation  Oncology Nurse Navigator Flowsheets 10/08/2015  Navigator Location [facility] On[facility]  Navigator Encounter Type Clinic/MDC  Patient Visit Type MedOnc  Treatment Phase Treatment  Barriers/Navigation Needs No barriers at this time  Interventions None required  Acuity Level 1  Time Spent with Patient 15   I spoke with [name] today.  She is doing well.  She will continue with her treatment today. No barriers identified at this time.

## 2015-10-08 NOTE — Progress Notes (Signed)
[facility] [phone]   [phone]  OFFICE PROGRESS NOTE  GREEN, [name], MD [address] [place] [address]  DIAGNOSIS: Progressive non-small cell lung cancer initially diagnosed as Stage IIB/IIIA non-small cell lung cancer consistent with adenocarcinoma with negative EGFR mutation and negative ALK gene translocation diagnosed in December 2012.   PRIOR THERAPY:  1) Status post radiation therapy to the right upper lobe lung mass completed on 06/04/2011 under the care of Dr. Pablo Ledger.  2) Systemic chemotherapy with carboplatin for AUC of 5 and Alimta 500 mg/M2 giving every 3 weeks, status post 3 cycles, last dose was given on 07/21/2011.  3) Systemic chemotherapy with carboplatin for AUC of 5 and Alimta 500 MG/M2 every 3 weeks. First dose on 01/02/2015. Status post 2 cycles, last dose was given 01/30/2015. Her treatment is currently on hold secondary to intolerance.  CURRENT THERAPY: Immunotherapy with Nivolumab 240 MG every 2 weeks. First dose 08/27/2015. Status post 3 cycles  INTERVAL HISTORY: [name] 70 y.o. female returns to the clinic today for follow up visit. She is currently on treatment with immunotherapy with Nivolumab status post 3 cycles. She is tolerating her treatment fairly well with no significant adverse effects. She is feeling fine today except for the baseline shortness of breath and occasional tightness in her chest. She was seen by [name] yesterday and was started on new inhaler, Anoro. She still on home oxygen 2 L/m. She denied having any significant weight loss or night sweats. She has no nausea or vomiting, no fever or chills. She is here today to start cycle #4.  MEDICAL HISTORY: Past Medical History  Diagnosis Date  . 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 Restless legs syndrome (RLS)   . Chronic sinusitis   . Hypertension   . Hyperlipidemia   . Anemia, unspecified   . GERD (gastroesophageal reflux  disease)   . Respiratory failure with hypoxia 01/02/2010  . Complication of anesthesia     slow to wake up  . Asthma   . Coronary artery disease     Dr. Burt Knack had stress test done  . Peripheral vascular disease ([place])   . PAD (peripheral artery disease) ([place])   . Heart murmur   . Dysrhythmia     irregular  . Anginal pain ([place])   . History of blood clots     "2 in my aorta"  . Hard of hearing, right   . History of bronchitis   . Bronchial pneumonia     history  . COPD (chronic obstructive pulmonary disease) ([place])   . Emphysema   . Shortness of breath     "anytime really"  . Syncope and collapse   . Chemotherapy adverse reaction 09/15/11    "makes me light headed and pass out; this is 3rd blood transfusion since going thru it"  . History of blood transfusion   . Kidney infection     "related to chemo"  . Sinus headache     "all the time"  . Arthritis     "all over my body"  . Fibromyalgia   . Osteoporosis   . DDD (degenerative disc disease), lumbar   . Scoliosis   . Anxiety   . Mental disorder   . Reflux   . CHF (congestive heart failure) ([place])   . DVT (deep venous thrombosis) ([place])   . Hx of radiation therapy 04/28/11 -06/08/11    right upper lobe-lung  .  Acute upper respiratory infections of unspecified site   . Pain in thoracic spine   . Asymptomatic varicose veins   . Functional urinary incontinence   . Diseases of lips   . Other abnormal blood chemistry   . Effects of radiation, unspecified   . Anemia in neoplastic disease   . Muscle weakness (generalized)   . Dizziness and giddiness   . Spasm of muscle   . Unspecified vitamin D deficiency   . Hyperpotassemia   . Acute posthemorrhagic anemia   . Unspecified hearing loss   . Disorder of bone and cartilage, unspecified   . Screening for thyroid disorder   . Hypopotassemia   . Atherosclerosis of native arteries of the extremities, unspecified   . Reflux esophagitis   . Unspecified constipation   .  Unspecified hereditary and idiopathic peripheral neuropathy   . Urinary tract infection, site not specified   . Muscle weakness (generalized)   . Acute sinusitis, unspecified   . Herpes zoster without mention of complication   . 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 Other and unspecified hyperlipidemia   . Anxiety state, unspecified   . Depressive disorder, not elsewhere classified   . Unspecified essential hypertension   . Atrial fibrillation ([place])   . Chronic airway obstruction, not elsewhere classified   . Osteoarthrosis, unspecified whether generalized or localized, unspecified site   . Lumbago   . Hip pain, chronic 09/17/2014  . Type II or unspecified type diabetes mellitus without mention of complication, not stated as uncontrolled   . Type II or unspecified type diabetes mellitus with neurological manifestations, uncontrolled   . Type II or unspecified type diabetes mellitus without mention of complication, not stated as uncontrolled   . Adenocarcinoma, lung ([place])     upper left lobe  . Non-small cell lung cancer ([place]) 03/31/2011    Adenocarcinoma Rt upper lobe mass  . Malignant neoplasm of bronchus and lung, unspecified site   . Primary cancer of right upper lobe of lung ([place]) 02/08/2011    Biopsy Rt upper lobe mass 03/31/11>>adenocarcinoma DIAGNOSIS: Stage IIB/IIIA non-small cell lung cancer consistent with adenocarcinoma diagnosed in December 2012.   PRIOR THERAPY:  1) Status post radiation therapy to the right upper lobe lung mass completed on 06/04/2011 under the care of Dr. Pablo Ledger.  2) Systemic chemotherapy with carboplatin for AUC of 5 and Alimta 500 mg/M2 giving every 3   . Encounter for antineoplastic immunotherapy 08/19/2015  . Chronic fatigue 08/19/2015    ALLERGIES:  is allergic to iohexol; other; anoro ellipta; codeine; and protonix.  MEDICATIONS:  Current Outpatient Prescriptions  Medication Sig Dispense Refill  . albuterol (PROVENTIL HFA;VENTOLIN HFA) 108  (90 Base) MCG/ACT inhaler Inhale 2 puffs into the lungs every 4 (four) hours as needed for wheezing or shortness of breath. 1 Inhaler 2  . ALPRAZolam (XANAX) 1 MG tablet     . AMBULATORY NON FORMULARY MEDICATION Lighter Weight Rollator with Padded Seat and Hand Brakes Dx: 496, 729.1, 722.52 1 each 0  . AMBULATORY NON FORMULARY MEDICATION Raised Commode Seat with Rail Dx: M51.36, M54.5 1 each 0  . aspirin 81 MG tablet Take 81 mg by mouth daily.    . citalopram (CELEXA) 20 MG tablet TAKE 1 TABLET BY MOUTH EVERY DAY TO HELP MOOD 90 tablet 1  . cyproheptadine (PERIACTIN) 4 MG tablet Take 1 tablet (4 mg total) by mouth 2 (two) times daily. For itching 30 tablet 0  . diltiazem (CARDIZEM CD)  240 MG 24 hr capsule TAKE 1 CAPSULE BY MOUTH EVERY DAY FOR BLOOD PRESSURE. 30 capsule 1  . diphenhydrAMINE (BENADRYL) 25 mg capsule Take 25 mg by mouth every 6 (six) hours as needed.    . fluticasone (FLONASE) 50 MCG/ACT nasal spray INSTILL 2 SPRAYS INTO BOTH NOSTRILS  TWICE DAILY *NEEDS APPOINTMENT* 16 g 5  . gemfibrozil (LOPID) 600 MG tablet TAKE 1 TABLET BY MOUTH ONCE DAILY 30 tablet 3  . glucose blood (FREESTYLE LITE) test strip Use to test blood sugar three times daily. Dx: E11.9, E11.49 100 each 12  . HYDROcodone-acetaminophen (NORCO) 10-325 MG tablet Take One tablet by mouth every 6 hours if needed for pain 120 tablet 0  . Lancets (FREESTYLE) lancets Use to test blood sugar three times daily. Dx: E11.9, E11.49 100 each 12  . loratadine (CLARITIN) 10 MG tablet TAKE 1 TABLET BY MOUTH EVERY DAY 30 tablet 3  . losartan (COZAAR) 50 MG tablet TAKE 1 TABLET BY MOUTH TWICE DAILY 60 tablet 5  . metFORMIN (GLUCOPHAGE) 500 MG tablet TAKE 1 TABLET BY MOUTH TWICE DAILY TO CONTROL BLOOD SUGAR. 60 tablet 1  . metoCLOPramide (REGLAN) 5 MG tablet Take 5 mg by mouth 4 (four) times daily.    . metoCLOPramide (REGLAN) 5 MG tablet TAKE 1 TABLET BY MOUTH 4 TIMES A DAY. 120 tablet 1  . Multiple Vitamins-Minerals (CENTRUM SILVER  PO) Take 1 tablet by mouth daily. Patient unsure of dose    . NON FORMULARY Place 3 L into the nose daily. Oxygen    . potassium chloride (K-DUR,KLOR-CON) 10 MEQ tablet TAKE 1 TABLET BY MOUTH EVERY DAY 30 tablet 5  . traMADol (ULTRAM) 50 MG tablet TAKE 1 TABLET BY MOUTH THREE TIMES A DAY AS NEEDED FOR PAIN 90 tablet 5  . triamterene-hydrochlorothiazide (MAXZIDE-25) 37.5-25 MG tablet TAKE 1 TABLET BY MOUTH EVERY DAY TO CONTROL BLOOD PRESSURE 90 tablet 2  . umeclidinium-vilanterol (ANORO ELLIPTA) 62.5-25 MCG/INH AEPB Inhale 1 puff into the lungs daily.    . [DISCONTINUED] calcium carbonate 200 MG capsule Take 250 mg by mouth daily. Does not take everyday     No current facility-administered medications for this visit.   Facility-Administered Medications Ordered in Other Visits  Medication Dose Route Frequency Provider Last Rate Last Dose  . hyaluronate sodium (RADIAPLEXRX) gel   Topical Once Thea Silversmith, MD      . topical emolient (BIAFINE) emulsion   Topical PRN Thea Silversmith, MD        SURGICAL HISTORY:  Past Surgical History  Procedure Laterality Date  . Lung lobectomy  1986    left  . Septoplasty    . Total hip arthroplasty  01/2010    left  . Bronchoscopy  02/2011  . Dilation and curettage of uterus  1980's  . Tubal ligation  1980's  . Cardiac catheterization    . Fetal blood transfusion  09/16/11  . Joint replacement  04/13/10    Left Hip  . Cataract extraction  03/12/2013    Left Eye   . Eye surgery Left 2014    Cataract    REVIEW OF SYSTEMS:  A comprehensive review of systems was negative except for: Respiratory: positive for dyspnea on exertion   PHYSICAL EXAMINATION: General appearance: alert, cooperative, fatigued and no distress Head: Normocephalic, without obvious abnormality, atraumatic Neck: no adenopathy, no JVD, supple, symmetrical, trachea midline and thyroid not enlarged, symmetric, no tenderness/mass/nodules Lymph nodes: Cervical, supraclavicular, and  axillary nodes normal. Resp: clear to auscultation  bilaterally Back: symmetric, no curvature. ROM normal. No CVA tenderness. Cardio: regular rate and rhythm, S1, S2 normal, no murmur, click, rub or gallop GI: soft, non-tender; bowel sounds normal; no masses,  no organomegaly Extremities: extremities normal, atraumatic, no cyanosis or edema Neurologic: Alert and oriented X 3, normal strength and tone. Normal symmetric reflexes. Normal coordination and gait  ECOG PERFORMANCE STATUS: 1 - Symptomatic but completely ambulatory  Blood pressure 146/53, pulse 96, temperature 98.7 F (37.1 C), temperature source Oral, resp. rate 20, height 5' 6"  (1.676 m), weight 168 lb 4.8 oz (76.34 kg), SpO2 99 %.  LABORATORY DATA: Lab Results  Component Value Date   WBC 8.4 10/08/2015   HGB 10.8* 10/08/2015   HCT 32.5* 10/08/2015   MCV 93.4 10/08/2015   PLT 324 10/08/2015      Chemistry      Component Value Date/Time   NA 141 09/24/2015 1101   NA 139 08/06/2015 1036   NA 138 04/15/2015 1455   NA 142 10/25/2011 1200   K 3.8 09/24/2015 1101   K 3.9 08/06/2015 1036   K 4.6 10/25/2011 1200   CL 97 08/06/2015 1036   CL 99 07/21/2012 1125   CL 97* 10/25/2011 1200   CO2 26 09/24/2015 1101   CO2 23 08/06/2015 1036   CO2 24 10/25/2011 1200   BUN 17.1 09/24/2015 1101   BUN 19 08/06/2015 1036   BUN 16 04/15/2015 1455   BUN 15 10/25/2011 1200   CREATININE 1.1 09/24/2015 1101   CREATININE 1.29* 08/06/2015 1036   CREATININE 0.8 10/25/2011 1200      Component Value Date/Time   CALCIUM 10.0 09/24/2015 1101   CALCIUM 9.9 08/06/2015 1036   CALCIUM 9.6 10/25/2011 1200   ALKPHOS 112 09/24/2015 1101   ALKPHOS 118* 08/06/2015 1036   ALKPHOS 106* 10/25/2011 1200   AST 16 09/24/2015 1101   AST 22 08/06/2015 1036   AST 25 10/25/2011 1200   ALT <9 09/24/2015 1101   ALT 10 08/06/2015 1036   ALT 14 10/25/2011 1200   BILITOT 0.42 09/24/2015 1101   BILITOT 0.3 08/06/2015 1036   BILITOT 0.9 04/15/2015 1455    BILITOT 0.60 10/25/2011 1200       RADIOGRAPHIC STUDIES: No results found. ASSESSMENT AND PLAN: this is a very pleasant 70 years old white female with history of stage IIB/IIIA non-small cell lung cancer status post concurrent chemoradiation followed by consolidation chemotherapy with carboplatin and Alimta and has been observation since April 2013. She has no evidence for disease progression on his recent scan except for a continuous enlargement of the right upper lobe opacity. The molecular study for EGFR mutation was reported to be negative. The patient was started on treatment with systemic chemotherapy with carboplatin and Alimta status post 2 cycles but her treatment has been on hold secondary to increasing fatigue and weakness as well as pancytopenia. The restaging scan of the chest performed on 04/07/2016 showed mild improvement of her disease. I discussed the scan results with the patient and her daughter. I recommended for her to continue on observation for now with repeat CT scan of the chest in 3 months for restaging of her disease. The CT scan of the chest performed on 08/12/2015 showed evidence for disease progression in the right lung. The patient is currently on treatment with immunotherapy with Nivolumab status post 3 cycles and tolerating her treatment well. I recommended for her to proceed with cycle #4 today as a scheduled. I will see her back  for follow-up visit in 3 weeks for evaluation with the start of cycle #5 after repeating CT scan of the chest for restaging of her disease. She was advised to call immediately if she has any concerning symptoms in the interval. The patient voices understanding of current disease status and treatment options and is in agreement with the current care plan.  All questions were answered. The patient knows to call the clinic with any problems, questions or concerns. We can certainly see the patient much sooner if necessary.  Disclaimer:  This note was dictated with voice recognition software. Similar sounding words can inadvertently be transcribed and may not be corrected upon review.

## 2015-10-16 ENCOUNTER — Telehealth: Payer: Self-pay | Admitting: Internal Medicine

## 2015-10-16 NOTE — Telephone Encounter (Signed)
returned call and s.w. pt and confirmed appt....pt ok and aware °

## 2015-10-20 ENCOUNTER — Other Ambulatory Visit: Payer: Self-pay | Admitting: Internal Medicine

## 2015-10-20 ENCOUNTER — Telehealth: Payer: Self-pay | Admitting: Emergency Medicine

## 2015-10-20 DIAGNOSIS — C3411 Malignant neoplasm of upper lobe, right bronchus or lung: Secondary | ICD-10-CM

## 2015-10-20 NOTE — Telephone Encounter (Signed)
Spoke with patient - states that she woke up 2 mornings with her arms numb - shoulders down.  Pt states that she did not take Anoro yesterday and when she woke up this morning she felt fine.  Pt has not taken Anoro today either and feels fine today so far.  Pt states that she does not feel comfortable taking this medication any longer.

## 2015-10-20 NOTE — Telephone Encounter (Signed)
I do not know of any connection between her sx and the Anoro. If she believes that there is a connection then I agree with stopping the medication for several days to see if her numbness returns. Would not start any new inhalers until we sort out this question. Have her follow with me or APP to discuss an alternative

## 2015-10-20 NOTE — Telephone Encounter (Signed)
Called spoke with pt. Aware of below. She is scheduled to see RB 7/25 at 11am. Nothing further needed

## 2015-10-21 ENCOUNTER — Telehealth: Payer: Self-pay | Admitting: Medical Oncology

## 2015-10-21 NOTE — Telephone Encounter (Signed)
Confirmed appts.

## 2015-10-23 ENCOUNTER — Ambulatory Visit: Payer: Medicare Other

## 2015-10-23 ENCOUNTER — Ambulatory Visit: Payer: Medicare Other | Admitting: Nurse Practitioner

## 2015-10-23 ENCOUNTER — Other Ambulatory Visit: Payer: Medicare Other

## 2015-10-23 ENCOUNTER — Telehealth: Payer: Self-pay

## 2015-10-23 MED ORDER — CYPROHEPTADINE HCL 4 MG PO TABS: 4.0000 mg | ORAL_TABLET | Freq: Two times a day (BID) | ORAL | Status: DC

## 2015-10-23 NOTE — Telephone Encounter (Addendum)
Patient has a pending appointment with Dr.Fields (Vascular Specialist) on 11-06-15 @ 11:30am. Patient was told although she is an established patient she needs a referral prior to appointment    Per [name] (referral coordinator) patient's insurance does not require a referral

## 2015-10-23 NOTE — Telephone Encounter (Signed)
Pateint called c/o ongoing itching patient needs a refill on Cyproheptadine 4 mg tablet   RX sent

## 2015-10-24 NOTE — Telephone Encounter (Signed)
Left message on voicemail informing patient per [name] no referral needed

## 2015-10-27 ENCOUNTER — Ambulatory Visit (HOSPITAL_COMMUNITY): Payer: Medicare Other

## 2015-10-27 ENCOUNTER — Ambulatory Visit (HOSPITAL_COMMUNITY)
Admission: RE | Admit: 2015-10-27 | Discharge: 2015-10-27 | Disposition: A | Discharge status: Home / Self Care | Payer: Medicare Other | Source: Ambulatory Visit | Attending: Internal Medicine | Admitting: Internal Medicine

## 2015-10-27 DIAGNOSIS — I700 Atherosclerosis of aorta: Secondary | ICD-10-CM | POA: Insufficient documentation

## 2015-10-27 DIAGNOSIS — J90 Pleural effusion, not elsewhere classified: Secondary | ICD-10-CM | POA: Insufficient documentation

## 2015-10-27 DIAGNOSIS — K760 Fatty (change of) liver, not elsewhere classified: Secondary | ICD-10-CM | POA: Insufficient documentation

## 2015-10-27 DIAGNOSIS — K8020 Calculus of gallbladder without cholecystitis without obstruction: Secondary | ICD-10-CM | POA: Diagnosis not present

## 2015-10-27 DIAGNOSIS — C3411 Malignant neoplasm of upper lobe, right bronchus or lung: Secondary | ICD-10-CM | POA: Diagnosis not present

## 2015-10-27 DIAGNOSIS — C3492 Malignant neoplasm of unspecified part of left bronchus or lung: Secondary | ICD-10-CM | POA: Diagnosis not present

## 2015-10-27 DIAGNOSIS — R911 Solitary pulmonary nodule: Secondary | ICD-10-CM | POA: Diagnosis not present

## 2015-10-27 DIAGNOSIS — I2510 Atherosclerotic heart disease of native coronary artery without angina pectoris: Secondary | ICD-10-CM | POA: Diagnosis not present

## 2015-10-27 DIAGNOSIS — Z902 Acquired absence of lung [part of]: Secondary | ICD-10-CM | POA: Diagnosis not present

## 2015-10-27 NOTE — Telephone Encounter (Signed)
Spoke with patient. Patient verbalized understanding that no referral needed

## 2015-10-28 ENCOUNTER — Ambulatory Visit (HOSPITAL_BASED_OUTPATIENT_CLINIC_OR_DEPARTMENT_OTHER): Payer: Medicare Other

## 2015-10-28 ENCOUNTER — Encounter: Payer: Self-pay | Admitting: Internal Medicine

## 2015-10-28 ENCOUNTER — Other Ambulatory Visit (HOSPITAL_BASED_OUTPATIENT_CLINIC_OR_DEPARTMENT_OTHER): Payer: Medicare Other

## 2015-10-28 ENCOUNTER — Telehealth: Payer: Self-pay | Admitting: Internal Medicine

## 2015-10-28 ENCOUNTER — Ambulatory Visit (INDEPENDENT_AMBULATORY_CARE_PROVIDER_SITE_OTHER): Payer: Medicare Other | Admitting: Internal Medicine

## 2015-10-28 ENCOUNTER — Ambulatory Visit (HOSPITAL_BASED_OUTPATIENT_CLINIC_OR_DEPARTMENT_OTHER): Payer: Medicare Other | Admitting: Internal Medicine

## 2015-10-28 VITALS — BP 122/62 | HR 72 | Temp 98.3°F | Ht 66.0 in | Wt 171.0 lb

## 2015-10-28 VITALS — BP 115/96 | HR 87 | Temp 98.6°F | Resp 20 | Ht 66.0 in | Wt 170.3 lb

## 2015-10-28 DIAGNOSIS — C3411 Malignant neoplasm of upper lobe, right bronchus or lung: Secondary | ICD-10-CM

## 2015-10-28 DIAGNOSIS — R5382 Chronic fatigue, unspecified: Secondary | ICD-10-CM | POA: Diagnosis not present

## 2015-10-28 DIAGNOSIS — M791 Myalgia: Secondary | ICD-10-CM

## 2015-10-28 DIAGNOSIS — E785 Hyperlipidemia, unspecified: Secondary | ICD-10-CM | POA: Diagnosis not present

## 2015-10-28 DIAGNOSIS — Z5112 Encounter for antineoplastic immunotherapy: Secondary | ICD-10-CM

## 2015-10-28 DIAGNOSIS — I5040 Unspecified combined systolic (congestive) and diastolic (congestive) heart failure: Secondary | ICD-10-CM | POA: Diagnosis not present

## 2015-10-28 DIAGNOSIS — D649 Anemia, unspecified: Secondary | ICD-10-CM | POA: Diagnosis not present

## 2015-10-28 DIAGNOSIS — M609 Myositis, unspecified: Secondary | ICD-10-CM

## 2015-10-28 DIAGNOSIS — J449 Chronic obstructive pulmonary disease, unspecified: Secondary | ICD-10-CM

## 2015-10-28 DIAGNOSIS — IMO0001 Reserved for inherently not codable concepts without codable children: Secondary | ICD-10-CM

## 2015-10-28 DIAGNOSIS — E1142 Type 2 diabetes mellitus with diabetic polyneuropathy: Secondary | ICD-10-CM

## 2015-10-28 DIAGNOSIS — C3491 Malignant neoplasm of unspecified part of right bronchus or lung: Secondary | ICD-10-CM

## 2015-10-28 LAB — COMPREHENSIVE METABOLIC PANEL
ALBUMIN: 3.9 g/dL (ref 3.5–5.0)
ALK PHOS: 125 U/L (ref 40–150)
ALT: 10 U/L (ref 0–55)
AST: 18 U/L (ref 5–34)
Anion Gap: 13 mEq/L — ABNORMAL HIGH (ref 3–11)
BILIRUBIN TOTAL: 0.36 mg/dL (ref 0.20–1.20)
BUN: 21 mg/dL (ref 7.0–26.0)
CALCIUM: 9.8 mg/dL (ref 8.4–10.4)
CO2: 23 mEq/L (ref 22–29)
Chloride: 104 mEq/L (ref 98–109)
Creatinine: 1.2 mg/dL — ABNORMAL HIGH (ref 0.6–1.1)
EGFR: 45 mL/min/{1.73_m2} — ABNORMAL LOW (ref >90)
Glucose: 94 mg/dl (ref 70–140)
POTASSIUM: 4.2 meq/L (ref 3.5–5.1)
Sodium: 140 mEq/L (ref 136–145)
TOTAL PROTEIN: 7.3 g/dL (ref 6.4–8.3)

## 2015-10-28 LAB — CBC WITH DIFFERENTIAL/PLATELET
BASO%: 0.3 % (ref 0.0–2.0)
BASOS ABS: 0 10*3/uL (ref 0.0–0.1)
EOS%: 14.3 % — ABNORMAL HIGH (ref 0.0–7.0)
Eosinophils Absolute: 1.3 10*3/uL — ABNORMAL HIGH (ref 0.0–0.5)
HEMATOCRIT: 33.1 % — AB (ref 34.8–46.6)
HGB: 11.1 g/dL — ABNORMAL LOW (ref 11.6–15.9)
LYMPH#: 1.7 10*3/uL (ref 0.9–3.3)
LYMPH%: 19.9 % (ref 14.0–49.7)
MCH: 31.1 pg (ref 25.1–34.0)
MCHC: 33.5 g/dL (ref 31.5–36.0)
MCV: 92.7 fL (ref 79.5–101.0)
MONO#: 0.7 10*3/uL (ref 0.1–0.9)
MONO%: 8.5 % (ref 0.0–14.0)
NEUT#: 5 10*3/uL (ref 1.5–6.5)
NEUT%: 57 % (ref 38.4–76.8)
PLATELETS: 296 10*3/uL (ref 145–400)
RBC: 3.57 10*6/uL — ABNORMAL LOW (ref 3.70–5.45)
RDW: 13 % (ref 11.2–14.5)
WBC: 8.8 10*3/uL (ref 3.9–10.3)

## 2015-10-28 LAB — TSH: TSH: 2.574 m(IU)/L (ref 0.308–3.960)

## 2015-10-28 MED ORDER — SODIUM CHLORIDE 0.9 % IV SOLN
240.0000 mg | Freq: Once | INTRAVENOUS | Status: AC
  Administered 2015-10-28: 240 mg via INTRAVENOUS
  Filled 2015-10-28: qty 24

## 2015-10-28 MED ORDER — SODIUM CHLORIDE 0.9 % IV SOLN
Freq: Once | INTRAVENOUS | Status: AC
  Administered 2015-10-28: 13:00:00 via INTRAVENOUS

## 2015-10-28 NOTE — Progress Notes (Signed)
[facility] [phone]   [phone]  OFFICE PROGRESS NOTE  [name], MD [phone] [address] [place] [address]  DIAGNOSIS: Progressive non-small cell lung cancer initially diagnosed as Stage IIB/IIIA non-small cell lung cancer consistent with adenocarcinoma with negative EGFR mutation and negative ALK gene translocation diagnosed in December 2012.   PRIOR THERAPY:  1) Status post radiation therapy to the right upper lobe lung mass completed on 06/04/2011 under the care of Dr. Pablo Ledger.  2) Systemic chemotherapy with carboplatin for AUC of 5 and Alimta 500 mg/M2 giving every 3 weeks, status post 3 cycles, last dose was given on 07/21/2011.  3) Systemic chemotherapy with carboplatin for AUC of 5 and Alimta 500 MG/M2 every 3 weeks. First dose on 01/02/2015. Status post 2 cycles, last dose was given 01/30/2015. Her treatment is currently on hold secondary to intolerance.  CURRENT THERAPY: Immunotherapy with Nivolumab 240 MG every 2 weeks. First dose 08/27/2015. Status post 4 cycles  INTERVAL HISTORY: [name] 70 y.o. female returns to the clinic today for follow up visit accompanied by her daughter. She is currently on treatment with immunotherapy with Nivolumab status post 4 cycles. She is tolerating her treatment fairly well with no significant adverse effects. She is feeling fine today except for the baseline shortness of breath. She still on home oxygen 2 L/m. She denied having any significant weight loss or night sweats. She has no nausea or vomiting, no fever or chills. She had repeat CT scan of the chest performed recently and she is here for evaluation and discussion of her scan results.   MEDICAL HISTORY: Past Medical History  Diagnosis Date  . 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 Restless legs syndrome (RLS)   . Chronic sinusitis   . Hypertension   . Hyperlipidemia   . Anemia, unspecified   . GERD (gastroesophageal reflux  disease)   . Respiratory failure with hypoxia 01/02/2010  . Complication of anesthesia     slow to wake up  . Asthma   . Coronary artery disease     Dr. Burt Knack had stress test done  . Peripheral vascular disease ([facility])   . PAD (peripheral artery disease) ([place])   . Heart murmur   . Dysrhythmia     irregular  . Anginal pain ([place])   . History of blood clots     "2 in my aorta"  . Hard of hearing, right   . History of bronchitis   . Bronchial pneumonia     history  . COPD (chronic obstructive pulmonary disease) ([facility])   . Emphysema   . Shortness of breath     "anytime really"  . Syncope and collapse   . Chemotherapy adverse reaction 09/15/11    "makes me light headed and pass out; this is 3rd blood transfusion since going thru it"  . History of blood transfusion   . Kidney infection     "related to chemo"  . Sinus headache     "all the time"  . Arthritis     "all over my body"  . Fibromyalgia   . Osteoporosis   . DDD (degenerative disc disease), lumbar   . Scoliosis   . Anxiety   . Mental disorder   . Reflux   . CHF (congestive heart failure) ([facility])   . DVT (deep venous thrombosis) ([facility])   . Hx of radiation therapy 04/28/11 -06/08/11    right upper lobe-lung  . Acute upper  respiratory infections of unspecified site   . Pain in thoracic spine   . Asymptomatic varicose veins   . Functional urinary incontinence   . Diseases of lips   . Other abnormal blood chemistry   . Effects of radiation, unspecified   . Anemia in neoplastic disease   . Muscle weakness (generalized)   . Dizziness and giddiness   . Spasm of muscle   . Unspecified vitamin D deficiency   . Hyperpotassemia   . Acute posthemorrhagic anemia   . Unspecified hearing loss   . Disorder of bone and cartilage, unspecified   . Screening for thyroid disorder   . Hypopotassemia   . Atherosclerosis of native arteries of the extremities, unspecified   . Reflux esophagitis   . Unspecified constipation   .  Unspecified hereditary and idiopathic peripheral neuropathy   . Urinary tract infection, site not specified   . Muscle weakness (generalized)   . Acute sinusitis, unspecified   . Herpes zoster without mention of complication   . 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 Other and unspecified hyperlipidemia   . Anxiety state, unspecified   . Depressive disorder, not elsewhere classified   . Unspecified essential hypertension   . Atrial fibrillation (HCC)   . Chronic airway obstruction, not elsewhere classified   . Osteoarthrosis, unspecified whether generalized or localized, unspecified site   . Lumbago   . Hip pain, chronic 09/17/2014  . Type II or unspecified type diabetes mellitus without mention of complication, not stated as uncontrolled   . Type II or unspecified type diabetes mellitus with neurological manifestations, uncontrolled   . Type II or unspecified type diabetes mellitus without mention of complication, not stated as uncontrolled   . Adenocarcinoma, lung (HCC)     upper left lobe  . Non-small cell lung cancer (HCC) 03/31/2011    Adenocarcinoma Rt upper lobe mass  . Malignant neoplasm of bronchus and lung, unspecified site   . Primary cancer of right upper lobe of lung (HCC) 02/08/2011    Biopsy Rt upper lobe mass 03/31/11>>adenocarcinoma DIAGNOSIS: Stage IIB/IIIA non-small cell lung cancer consistent with adenocarcinoma diagnosed in December 2012.   PRIOR THERAPY:  1) Status post radiation therapy to the right upper lobe lung mass completed on 06/04/2011 under the care of Dr. Michell Heinrich.  2) Systemic chemotherapy with carboplatin for AUC of 5 and Alimta 500 mg/M2 giving every 3   . Encounter for antineoplastic immunotherapy 08/19/2015  . Chronic fatigue 08/19/2015    ALLERGIES:  is allergic to iohexol; other; anoro ellipta; codeine; and protonix.  MEDICATIONS:  Current Outpatient Prescriptions  Medication Sig Dispense Refill  . albuterol (PROVENTIL HFA;VENTOLIN HFA) 108  (90 Base) MCG/ACT inhaler Inhale 2 puffs into the lungs every 4 (four) hours as needed for wheezing or shortness of breath. 1 Inhaler 2  . ALPRAZolam (XANAX) 1 MG tablet     . AMBULATORY NON FORMULARY MEDICATION Lighter Weight Rollator with Padded Seat and Hand Brakes Dx: 496, 729.1, 722.52 1 each 0  . AMBULATORY NON FORMULARY MEDICATION Raised Commode Seat with Rail Dx: M51.36, M54.5 1 each 0  . aspirin 81 MG tablet Take 81 mg by mouth daily.    . citalopram (CELEXA) 20 MG tablet TAKE 1 TABLET BY MOUTH EVERY DAY TO HELP MOOD 90 tablet 1  . cyproheptadine (PERIACTIN) 4 MG tablet Take 1 tablet (4 mg total) by mouth 2 (two) times daily. For itching 30 tablet 0  . diltiazem (CARDIZEM CD) 240 MG  24 hr capsule TAKE 1 CAPSULE BY MOUTH EVERY DAY FOR BLOOD PRESSURE. 30 capsule 1  . diphenhydrAMINE (BENADRYL) 25 mg capsule Take 25 mg by mouth every 6 (six) hours as needed.    . fluticasone (FLONASE) 50 MCG/ACT nasal spray INSTILL 2 SPRAYS INTO BOTH NOSTRILS  TWICE DAILY *NEEDS APPOINTMENT* 16 g 5  . gemfibrozil (LOPID) 600 MG tablet TAKE 1 TABLET BY MOUTH ONCE DAILY 30 tablet 3  . glucose blood (FREESTYLE LITE) test strip Use to test blood sugar three times daily. Dx: E11.9, E11.49 100 each 12  . HYDROcodone-acetaminophen (NORCO) 10-325 MG tablet Take One tablet by mouth every 6 hours if needed for pain 120 tablet 0  . Lancets (FREESTYLE) lancets Use to test blood sugar three times daily. Dx: E11.9, E11.49 100 each 12  . loratadine (CLARITIN) 10 MG tablet TAKE 1 TABLET BY MOUTH EVERY DAY 30 tablet 3  . losartan (COZAAR) 50 MG tablet TAKE 1 TABLET BY MOUTH TWICE DAILY 60 tablet 5  . metFORMIN (GLUCOPHAGE) 500 MG tablet TAKE 1 TABLET BY MOUTH TWICE DAILY TO CONTROL BLOOD SUGAR. 60 tablet 1  . metoCLOPramide (REGLAN) 5 MG tablet Take 5 mg by mouth 4 (four) times daily.    . metoCLOPramide (REGLAN) 5 MG tablet TAKE 1 TABLET BY MOUTH 4 TIMES A DAY. 120 tablet 1  . Multiple Vitamins-Minerals (CENTRUM SILVER  PO) Take 1 tablet by mouth daily. Patient unsure of dose    . NON FORMULARY Place 3 L into the nose daily. Oxygen    . potassium chloride (K-DUR,KLOR-CON) 10 MEQ tablet TAKE 1 TABLET BY MOUTH EVERY DAY 30 tablet 5  . traMADol (ULTRAM) 50 MG tablet TAKE 1 TABLET BY MOUTH THREE TIMES A DAY AS NEEDED FOR PAIN 90 tablet 5  . triamterene-hydrochlorothiazide (MAXZIDE-25) 37.5-25 MG tablet TAKE 1 TABLET BY MOUTH EVERY DAY TO CONTROL BLOOD PRESSURE 90 tablet 2  . umeclidinium-vilanterol (ANORO ELLIPTA) 62.5-25 MCG/INH AEPB Inhale 1 puff into the lungs daily.    . [DISCONTINUED] calcium carbonate 200 MG capsule Take 250 mg by mouth daily. Does not take everyday     No current facility-administered medications for this visit.   Facility-Administered Medications Ordered in Other Visits  Medication Dose Route Frequency Provider Last Rate Last Dose  . hyaluronate sodium (RADIAPLEXRX) gel   Topical Once Thea Silversmith, MD      . topical emolient (BIAFINE) emulsion   Topical PRN Thea Silversmith, MD        SURGICAL HISTORY:  Past Surgical History  Procedure Laterality Date  . Lung lobectomy  1986    left  . Septoplasty    . Total hip arthroplasty  01/2010    left  . Bronchoscopy  02/2011  . Dilation and curettage of uterus  1980's  . Tubal ligation  1980's  . Cardiac catheterization    . Fetal blood transfusion  09/16/11  . Joint replacement  04/13/10    Left Hip  . Cataract extraction  03/12/2013    Left Eye   . Eye surgery Left 2014    Cataract    REVIEW OF SYSTEMS:  Constitutional: positive for fatigue Eyes: negative Ears, nose, mouth, throat, and face: negative Respiratory: positive for dyspnea on exertion Cardiovascular: negative Gastrointestinal: negative Genitourinary:negative Integument/breast: negative Hematologic/lymphatic: negative Musculoskeletal:negative Neurological: negative Behavioral/Psych: negative Endocrine: negative Allergic/Immunologic: negative   PHYSICAL  EXAMINATION: General appearance: alert, cooperative, fatigued and no distress Head: Normocephalic, without obvious abnormality, atraumatic Neck: no adenopathy, no JVD, supple, symmetrical, trachea  midline and thyroid not enlarged, symmetric, no tenderness/mass/nodules Lymph nodes: Cervical, supraclavicular, and axillary nodes normal. Resp: clear to auscultation bilaterally Back: symmetric, no curvature. ROM normal. No CVA tenderness. Cardio: regular rate and rhythm, S1, S2 normal, no murmur, click, rub or gallop GI: soft, non-tender; bowel sounds normal; no masses,  no organomegaly Extremities: extremities normal, atraumatic, no cyanosis or edema Neurologic: Alert and oriented X 3, normal strength and tone. Normal symmetric reflexes. Normal coordination and gait  ECOG PERFORMANCE STATUS: 1 - Symptomatic but completely ambulatory  Blood pressure 115/96, pulse 87, temperature 98.6 F (37 C), temperature source Oral, resp. rate 20, height 5\' 6"  (1.676 m), weight 170 lb 4.8 oz (77.248 kg), SpO2 99 %.  LABORATORY DATA: Lab Results  Component Value Date   WBC 8.8 10/28/2015   HGB 11.1* 10/28/2015   HCT 33.1* 10/28/2015   MCV 92.7 10/28/2015   PLT 296 10/28/2015      Chemistry      Component Value Date/Time   NA 140 10/28/2015 1154   NA 139 08/06/2015 1036   NA 138 04/15/2015 1455   NA 142 10/25/2011 1200   K 4.2 10/28/2015 1154   K 3.9 08/06/2015 1036   K 4.6 10/25/2011 1200   CL 97 08/06/2015 1036   CL 99 07/21/2012 1125   CL 97* 10/25/2011 1200   CO2 23 10/28/2015 1154   CO2 23 08/06/2015 1036   CO2 24 10/25/2011 1200   BUN 21.0 10/28/2015 1154   BUN 19 08/06/2015 1036   BUN 16 04/15/2015 1455   BUN 15 10/25/2011 1200   CREATININE 1.2* 10/28/2015 1154   CREATININE 1.29* 08/06/2015 1036   CREATININE 0.8 10/25/2011 1200      Component Value Date/Time   CALCIUM 9.8 10/28/2015 1154   CALCIUM 9.9 08/06/2015 1036   CALCIUM 9.6 10/25/2011 1200   ALKPHOS 125 10/28/2015 1154    ALKPHOS 118* 08/06/2015 1036   ALKPHOS 106* 10/25/2011 1200   AST 18 10/28/2015 1154   AST 22 08/06/2015 1036   AST 25 10/25/2011 1200   ALT 10 10/28/2015 1154   ALT 10 08/06/2015 1036   ALT 14 10/25/2011 1200   BILITOT 0.36 10/28/2015 1154   BILITOT 0.3 08/06/2015 1036   BILITOT 0.9 04/15/2015 1455   BILITOT 0.60 10/25/2011 1200       RADIOGRAPHIC STUDIES: Ct Chest Wo Contrast  10/27/2015  CLINICAL DATA:  Right-sided lung cancer diagnosed 2012. Restaging. Left-sided lung cancer diagnosed 31 years ago. Radiation therapy complete. Right upper lobe primary. Ongoing chemotherapy. EXAM: CT CHEST WITHOUT CONTRAST TECHNIQUE: Multidetector CT imaging of the chest was performed following the standard protocol without IV contrast. COMPARISON:  08/12/2015 FINDINGS: Cardiovascular: Aortic and branch vessel atherosclerosis. Normal heart size with minimal anterior pericardial fluid or thickening, similar. Multivessel coronary artery atherosclerosis. Mediastinum/Nodes: No mediastinal or definite hilar adenopathy, given limitations of unenhanced CT. Esophageal fluid level. Lungs/Pleura: Small right-sided pleural effusion is similar. Right-sided pleural based calcification is adjacent to healing right rib fractures and likely dystrophic. No left-sided pleural fluid. Left upper lobectomy. Narrowing of left upper lobe bronchi secondary to volume loss, similar. Moderate centrilobular emphysema. Right upper or middle lobe ground-glass opacity with septal thickening. This measures on the order of 8.4 x 5.9 cm today versus 8.8 x 5.2 cm previously. Slightly more well-defined, with greater ground-glass density today. 2.6 cm on sagittal image 37 in craniocaudal dimension. Similar. A separate more inferior and lateral right upper lobe pulmonary nodule measures 4 mm on image 75/series  5 and is new. Also sagittal image 31. Posterior right upper lobe consolidation is likely treatment related and similar. Concurrent volume  loss. Left apical ground-glass nodule measures 6 mm on image 28/series 5 and is either new or more apparent today. A left lower lobe sub solid pulmonary nodule is unchanged at 7 mm on image 62/series 5 3 mm right lower lobe pulmonary nodule is similar on image 95/series 5. medial right middle lobe area of probable mucous plugging is similar on image 78/series 5. Upper Abdomen: Mild hepatic steatosis. Gallstones on the order of 6 mm. Normal imaged portions of the spleen, stomach, pancreas, kidneys. Low-density bilateral adrenal nodules are similar and likely due to underlying adenomas. Abdominal aortic atherosclerosis. Musculoskeletal: Postsurgical or posttraumatic right-sided rib defects. Prior vertebral augmentation at T4. T5 mild to moderate compression deformity is similar. IMPRESSION: 1. The dominant right-sided ground-glass opacity measures similar in size but is slightly more well-defined today, with increased density within. 2. An adjacent satellite 4 mm nodule is new and suspicious for progressive disease. Similarly, a left apical ground-glass nodule is new or increased and suspicious for progressive disease. 3. No thoracic adenopathy. 4. Other pulmonary lesions are similar. 5. Similar volume loss within the posterior right upper lobe with prior left upper lobectomy. 6. Similar small right pleural effusion. 7. Hepatic steatosis. 8.  Coronary artery atherosclerosis. Aortic atherosclerosis. 9. Cholelithiasis. 10. Esophageal air fluid level suggests dysmotility or gastroesophageal reflux. Electronically Signed   By: Abigail Miyamoto M.D.   On: 10/27/2015 17:22   ASSESSMENT AND PLAN: this is a very pleasant 69 years old white female with history of stage IIB/IIIA non-small cell lung cancer status post concurrent chemoradiation followed by consolidation chemotherapy with carboplatin and Alimta and has been observation since April 2013. She has no evidence for disease progression on his recent scan except for a  continuous enlargement of the right upper lobe opacity. The molecular study for EGFR mutation was reported to be negative. The patient was started on treatment with systemic chemotherapy with carboplatin and Alimta status post 2 cycles but her treatment has been on hold secondary to increasing fatigue and weakness as well as pancytopenia. The restaging scan of the chest performed on 04/07/2016 showed mild improvement of her disease. I discussed the scan results with the patient and her daughter. I recommended for her to continue on observation for now with repeat CT scan of the chest in 3 months for restaging of her disease. The CT scan of the chest performed on 08/12/2015 showed evidence for disease progression in the right lung. The patient is currently on treatment with immunotherapy with Nivolumab status post 4 cycles and tolerating her treatment well.  The recent CT scan of the chest showed no concerning evidence for disease progression except for new 4 mm nodule and left apical groundglass nodule. I discussed the scan results with the patient and her daughter and showed them the images. I recommended for her to continue her treatment with Nivolumab as a schedule and we will continue to monitor the 2 nodules closely on the upcoming scan. The patient and her daughter agreed to the current plan. She was advised to call immediately if she has any concerning symptoms in the interval. The patient voices understanding of current disease status and treatment options and is in agreement with the current care plan.  All questions were answered. The patient knows to call the clinic with any problems, questions or concerns. We can certainly see the patient much sooner if  necessary.  Disclaimer: This note was dictated with voice recognition software. Similar sounding words can inadvertently be transcribed and may not be corrected upon review.

## 2015-10-28 NOTE — Telephone Encounter (Signed)
Gave pt cal & avs °

## 2015-10-28 NOTE — Patient Instructions (Signed)
[facility] Discharge Instructions for Patients Receiving Chemotherapy  Today you received the following chemotherapy agents:  Nivolumab.  To help prevent nausea and vomiting after your treatment, we encourage you to take your nausea medication as directed.   If you develop nausea and vomiting that is not controlled by your nausea medication, call the clinic.   BELOW ARE SYMPTOMS THAT SHOULD BE REPORTED IMMEDIATELY:  *FEVER GREATER THAN 100.5 F  *CHILLS WITH OR WITHOUT FEVER  NAUSEA AND VOMITING THAT IS NOT CONTROLLED WITH YOUR NAUSEA MEDICATION  *UNUSUAL SHORTNESS OF BREATH  *UNUSUAL BRUISING OR BLEEDING  TENDERNESS IN MOUTH AND THROAT WITH OR WITHOUT PRESENCE OF ULCERS  *URINARY PROBLEMS  *BOWEL PROBLEMS  UNUSUAL RASH Items with * indicate a potential emergency and should be followed up as soon as possible.  Feel free to call the clinic you have any questions or concerns. The clinic phone number is [phone].  Please show the CHEMO ALERT CARD at check-in to the Emergency Department and triage nurse.   

## 2015-10-28 NOTE — Patient Instructions (Addendum)
Use up the Lopid and then stop it. Use up and stop the Losartan.

## 2015-10-28 NOTE — Progress Notes (Signed)
Patient ID: [name], female   DOB: 1946-03-19, 70 y.o.   MRN: [id number]    Facility  [facility]    Place of Service:   OFFICE    Allergies  Allergen Reactions  . Iohexol Shortness Of Breath and Other (See Comments)    "burns me up inside"    Pt does fine with 13 hour prep  01/17/12  . Other     CAN'T STAND THE SMELL OF PURE BLEACH; "HAVE TO BACK UP AND POUR AND DON'T BREATH IT TIL i GET CAP BACK ON; DILUTE IT TO SPRAY"  . Anoro Ellipta [Umeclidinium-Vilanterol] Swelling    Swelling of lips and mouth  . Codeine Itching and Nausea And Vomiting    REACTION: GI upset  . Protonix [Pantoprazole Sodium] Diarrhea    Chief Complaint  Patient presents with  . Medical Management of Chronic Issues    3 month medication management blood sugar, anemia, CHF, GERD, anxiety.  . Pruritis    all over, take Periactin that Dr. Nyoka Cowden called in for her, some help. Patient cut the Metformin from 2 tablets daily to one, to see if this is causing the itching.     HPI:  Pruritus Lab from 10/28/2015 include CMP, CBC, TSH: No significant abnormalities. Mild anemia with hemoglobin of 11.2 which is improved from 2 weeks ago when hemoglobin was 10.8. Eosinophils are high at 14.3% and a total eosinophil count of 1300. This was present 2 weeks ago and one month ago as well. She has cut back on the pain medi ations an Metformin to see if the itching got better. Has used Pereiactin which does help the itching. Notes small red bumps on the skin.  Patient was seen 10/28/2015 by Dr. Curt Bears for her lung cancer. She has known non-small cell cancer consistent with adenocarcinoma. She is status post radiation therapy to the right upper lung mass completed on 06/04/2011. She had systemic chemotherapy. Cycles are outlined in the notes by Dr. [name]. She is currently on treatment with immunotherapy with Nivolumab. Recent CT of the chest showed no evidence for disease progression except for a new 4 mm nodule and left  apical groundglass nodule. She is tolerating treatment with Nivolumab. It was elected to continue with this medication.  Breathing is about the same. She is on O2 24/7. Using portable O2 when out of the house.  Medications: Patient's Medications  New Prescriptions   No medications on file  Previous Medications   ALPRAZOLAM (XANAX) 1 MG TABLET       ASPIRIN 81 MG TABLET    Take 81 mg by mouth daily.   CITALOPRAM (CELEXA) 20 MG TABLET    TAKE 1 TABLET BY MOUTH EVERY DAY TO HELP MOOD   CYPROHEPTADINE (PERIACTIN) 4 MG TABLET    Take 1 tablet (4 mg total) by mouth 2 (two) times daily. For itching   DILTIAZEM (CARDIZEM CD) 240 MG 24 HR CAPSULE    TAKE 1 CAPSULE BY MOUTH EVERY DAY FOR BLOOD PRESSURE.   FLUTICASONE (FLONASE) 50 MCG/ACT NASAL SPRAY    INSTILL 2 SPRAYS INTO BOTH NOSTRILS  TWICE DAILY *NEEDS APPOINTMENT*   GEMFIBROZIL (LOPID) 600 MG TABLET    TAKE 1 TABLET BY MOUTH ONCE DAILY   GLUCOSE BLOOD (FREESTYLE LITE) TEST STRIP    Use to test blood sugar three times daily. Dx: E11.9, E11.49   HYDROCODONE-ACETAMINOPHEN (NORCO) 10-325 MG TABLET    Take One tablet by mouth every 6 hours if needed for pain  LANCETS (FREESTYLE) LANCETS    Use to test blood sugar three times daily. Dx: E11.9, E11.49   LORATADINE (CLARITIN) 10 MG TABLET    TAKE 1 TABLET BY MOUTH EVERY DAY   LOSARTAN (COZAAR) 50 MG TABLET    TAKE 1 TABLET BY MOUTH TWICE DAILY   METFORMIN (GLUCOPHAGE) 500 MG TABLET    Take by mouth. Take one tablet at night for blood sugar   METOCLOPRAMIDE (REGLAN) 5 MG TABLET    Take 5 mg by mouth 4 (four) times daily.   METOCLOPRAMIDE (REGLAN) 5 MG TABLET    TAKE 1 TABLET BY MOUTH 4 TIMES A DAY.   MULTIPLE VITAMINS-MINERALS (CENTRUM SILVER PO)    Take 1 tablet by mouth daily. Patient unsure of dose   NON FORMULARY    Place 3 L into the nose daily. Oxygen   POTASSIUM CHLORIDE (K-DUR,KLOR-CON) 10 MEQ TABLET    TAKE 1 TABLET BY MOUTH EVERY DAY   TRAMADOL (ULTRAM) 50 MG TABLET    TAKE 1 TABLET BY MOUTH  THREE TIMES A DAY AS NEEDED FOR PAIN   TRIAMTERENE-HYDROCHLOROTHIAZIDE (MAXZIDE-25) 37.5-25 MG TABLET    TAKE 1 TABLET BY MOUTH EVERY DAY TO CONTROL BLOOD PRESSURE  Modified Medications   No medications on file  Discontinued Medications   ALBUTEROL (PROVENTIL HFA;VENTOLIN HFA) 108 (90 BASE) MCG/ACT INHALER    Inhale 2 puffs into the lungs every 4 (four) hours as needed for wheezing or shortness of breath.   AMBULATORY NON FORMULARY MEDICATION    Lighter Weight Rollator with Padded Seat and Hand Brakes Dx: 496, 729.1, 722.52   AMBULATORY NON FORMULARY MEDICATION    Raised Commode Seat with Rail Dx: M51.36, M54.5   DIPHENHYDRAMINE (BENADRYL) 25 MG CAPSULE    Take 25 mg by mouth every 6 (six) hours as needed.   METFORMIN (GLUCOPHAGE) 500 MG TABLET    TAKE 1 TABLET BY MOUTH TWICE DAILY TO CONTROL BLOOD SUGAR.   UMECLIDINIUM-VILANTEROL (ANORO ELLIPTA) 62.5-25 MCG/INH AEPB    Inhale 1 puff into the lungs daily.    Review of Systems  Constitutional: Positive for activity change. Negative for fever and chills.  HENT: Positive for postnasal drip, sinus pressure and trouble swallowing. Negative for congestion, ear pain and sore throat.        Popping noise in ears  Eyes: Negative.   Respiratory: Positive for cough and shortness of breath. Negative for choking, chest tightness, wheezing and stridor.        Lung cancer.  Cardiovascular: Negative for chest pain and palpitations.  Gastrointestinal: Positive for constipation. Negative for nausea, abdominal pain, diarrhea and abdominal distention.       Reflux frequently.  Musculoskeletal: Positive for myalgias, back pain, arthralgias, gait problem and neck pain.       Left hip pain  Skin:       Generalized itching and occasional red bumps.  Neurological: Positive for dizziness, weakness (generalized), light-headedness and headaches. Negative for tremors and syncope.  Hematological:       Chronic anemia  Psychiatric/Behavioral: Positive for sleep  disturbance and dysphoric mood. Negative for hallucinations. The patient is not nervous/anxious and is not hyperactive.     Filed Vitals:   10/28/15 1534  BP: 122/62  Pulse: 72  Temp: 98.3 F (36.8 C)  TempSrc: Oral  Height: 5' 6"  (1.676 m)  Weight: 171 lb (77.565 kg)  SpO2: 97%   Body mass index is 27.61 kg/(m^2). Wt Readings from Last 3 Encounters:  10/28/15 171 lb (77.565  kg)  10/28/15 170 lb 4.8 oz (77.248 kg)  10/08/15 168 lb 4.8 oz (76.34 kg)      Physical Exam  Constitutional: She is oriented to person, place, and time.  overweight  HENT:  Nose: Nose normal.  Mouth/Throat: Oropharynx is clear and moist. No oropharyngeal exudate.  Loss of hearing.  Eyes: Conjunctivae and EOM are normal. Pupils are equal, round, and reactive to light.  Neck: No JVD present. No tracheal deviation present. No thyromegaly present.  Cardiovascular: Normal rate, regular rhythm and normal heart sounds.  Exam reveals no gallop and no friction rub.   No murmur heard. Pulmonary/Chest: No respiratory distress. She has no wheezes. She has no rales. She exhibits no tenderness.  Abdominal: She exhibits no distension and no mass. There is no tenderness.  Musculoskeletal: Normal range of motion. She exhibits edema and tenderness.  Lymphadenopathy:    She has no cervical adenopathy.  Neurological: She is oriented to person, place, and time. No cranial nerve deficit. Coordination normal.  Skin: No rash noted. No erythema. No pallor.  Psychiatric: Her behavior is normal. Judgment and thought content normal.    Labs reviewed: Lab Summary Latest Ref Rng 10/28/2015 10/08/2015 09/24/2015  Hemoglobin 11.6 - 15.9 g/dL 11.1(L) 10.8(L) 10.9(L)  Hematocrit 34.8 - 46.6 % 33.1(L) 32.5(L) 33.0(L)  White count 3.9 - 10.3 10e3/uL 8.8 8.4 8.0  Platelet count 145 - 400 10e3/uL 296 324 326  Sodium 136 - 145 mEq/L 140 140 141  Potassium 3.5 - 5.1 mEq/L 4.2 4.1 3.8  Calcium 8.4 - 10.4 mg/dL 9.8 9.7 10.0  Phosphorus  - (None) (None) (None)  Creatinine 0.6 - 1.1 mg/dL 1.2(H) 1.2(H) 1.1  AST 5 - 34 U/L 18 17 16   Alk Phos 40 - 150 U/L 125 121 112  Bilirubin 0.20 - 1.20 mg/dL 0.36 <0.30 0.42  Glucose 70 - 140 mg/dl 94 83 95  Cholesterol - (None) (None) (None)  HDL cholesterol - (None) (None) (None)  Triglycerides - (None) (None) (None)  LDL Direct - (None) (None) (None)  LDL Calc - (None) (None) (None)  Total protein 6.4 - 8.3 g/dL 7.3 7.2 7.2  Albumin 3.5 - 5.0 g/dL 3.9 3.9 3.9   Lab Results  Component Value Date   TSH 2.574 10/28/2015   TSH 1.745 09/24/2015   TSH 2.763 08/27/2015   Lab Results  Component Value Date   BUN 21.0 10/28/2015   BUN 22.4 10/08/2015   BUN 17.1 09/24/2015   Lab Results  Component Value Date   HGBA1C 5.5 08/06/2015   HGBA1C 5.7* 12/18/2013   HGBA1C 5.1 09/15/2011    Assessment/Plan  1. DM type 2 with diabetic peripheral neuropathy (HCC) Controlled - Comprehensive metabolic panel; Future - Hemoglobin A1c; Future  2. Anemia, unspecified anemia type Slightly improved  3. Dyslipidemia Stable Discontinue Lopid - Lipid panel; Future  4. Myalgia and myositis Generalized muscular aches, unchanged - CK; Future  5. Chronic obstructive pulmonary disease, unspecified COPD type ([place]) Continue see [name]. Patient is now off Anoro.  6. Primary cancer of right upper lobe of lung ([place]) Continue with Nivolumab. Continue with Dr. [name].  7. Combined systolic and diastolic congestive heart failure, unspecified congestive heart failure chronicity ([place]) Appears to be quite compensated. Discontinue losartan. Blood pressures have been running a little low and I think we should stop this drug because of the pruritus. Etiology of the pruritus is undetermined.

## 2015-10-30 ENCOUNTER — Encounter: Payer: Self-pay | Admitting: Family

## 2015-11-03 ENCOUNTER — Other Ambulatory Visit: Payer: Self-pay | Admitting: *Deleted

## 2015-11-03 DIAGNOSIS — G44229 Chronic tension-type headache, not intractable: Secondary | ICD-10-CM

## 2015-11-03 DIAGNOSIS — M25552 Pain in left hip: Secondary | ICD-10-CM

## 2015-11-03 DIAGNOSIS — M5136 Other intervertebral disc degeneration, lumbar region: Secondary | ICD-10-CM

## 2015-11-03 DIAGNOSIS — G8929 Other chronic pain: Secondary | ICD-10-CM

## 2015-11-03 MED ORDER — HYDROCODONE-ACETAMINOPHEN 10-325 MG PO TABS: ORAL_TABLET | ORAL | 0 refills | Status: DC

## 2015-11-03 NOTE — Telephone Encounter (Signed)
Patient requested and will pick up 

## 2015-11-04 ENCOUNTER — Ambulatory Visit (INDEPENDENT_AMBULATORY_CARE_PROVIDER_SITE_OTHER): Payer: Medicare Other | Admitting: Emergency Medicine

## 2015-11-04 ENCOUNTER — Encounter: Payer: Self-pay | Admitting: Emergency Medicine

## 2015-11-04 DIAGNOSIS — C3411 Malignant neoplasm of upper lobe, right bronchus or lung: Secondary | ICD-10-CM | POA: Diagnosis not present

## 2015-11-04 DIAGNOSIS — J449 Chronic obstructive pulmonary disease, unspecified: Secondary | ICD-10-CM | POA: Diagnosis not present

## 2015-11-04 NOTE — Assessment & Plan Note (Signed)
Failed Anoro and Spiriva likely because of the powdered formulation. I like to try her on Stiolto to see if she tolerates and benefits. Continue SABA prn as well.

## 2015-11-04 NOTE — Progress Notes (Signed)
Subjective:    Patient ID: [name], female    DOB: [birth year], 70 y.o.   MRN: [id number] HPI History of Present Illness:  [name] is a 70 y.o. 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 Has received XRT and chemo with Drs Pablo Ledger and Julien Nordmann.  She tolerated XRT completely. She has completed 3 cycles chemo.  Currently managed on combivent qid, started O2.   ROV 08/01/14 -- follow-up visit for COPD and adenocarcinoma of the long in the right upper lobe. She underwent chemotherapy and radiation therapy and is currently under observation.  Her last CT scan was 07/29/14. There was some possible increase in groundglass the right upper lung zone. She c/o being more weak, more DOE. She is more fatigued. Her o2 is set on 2L/min, occasionally to 3L/min.   ROV 04/24/15 -- follow up visit for COPD and RUL adenoCA. Her last chemotherapy was in November '16, she hasn't had any since due to side effects and fatigue. Most recent CT was 12/27 which I have personally reviewed. Shows interval decrease in size of her RML GG nodule. She has felt worse for the last 3 weeks. She has had cough, rhinorrhea, congestion, subjective fever, dizziness, upset stomach and felt bad for 3 weeks. She was seen in the ED 1/3, treated with azithro. No improvement. She is taking combivent prn, about 2x a day. She started mucinex yesterday.   ROV 10/07/15 -- patient with a history of COPD, right upper lobe adenocarcinoma stage IIB/IIIa she has some enlargement of her right upper lobe opacity. She has been on immunotherapy with Nivolumab, last dose was 2 weeks ago. She is having some itching, not sure whether associated with the therapy. She will likely have another CT chest in the next 2 months. She is having continued chest tightness. More SOB with exertion. She is using combivent prn, usually tid. She is more SOB at night, hears wheezing.   ROV 11/04/15 -- this is a follow-up visit for history of  stage IIb/IIIa adenocarcinoma of the right upper lobe, COPD. She has been managed most recently on Nivolumab. Her most recent CT scan of the chest was on 10/27/15 I reviewed this today. Overall there is stability in her right-sided groundglass opacity. There is a new adjacent 4 mm satellite nodule in this region. Also there is some left apical groundglass that is new compared with her prior study. These are to be followed with serial scanning on current therapy. Most recently we tried starting Anoro > she took this for about 10 days, had significant cough and hoarseness. She's been tried on Spiriva before as well but was unable to tolerate due to similar sx.  She is using ventolin prn. She did tolerate combivent in the past.       Objective:   Physical Exam Vitals:   11/04/15 1104 11/04/15 1105  BP:  130/64  BP Location:  Left Arm  Cuff Size:  Normal  Pulse:  88  SpO2:  99%  Weight: 170 lb (77.1 kg)   Height: '5\' 6"'$  (1.676 m)    Gen: Pleasant, elderly woman, in no distress,  normal affect  ENT: No lesions,  mouth clear,  oropharynx clear, no postnasal drip  Neck: No JVD, no TMG, no carotid bruits  Lungs: No use of accessory muscles, distant, no wheeze  Cardiovascular: RRR, heart sounds normal, no murmur or gallops, no peripheral edema  Musculoskeletal: No deformities, no cyanosis or clubbing. Tender to palp  of the R pectoral area  Neuro: alert, non focal  Skin: Warm, no lesions or rashes  CBC Latest Ref Rng & Units 10/28/2015 10/08/2015 09/24/2015  WBC 3.9 - 10.3 10e3/uL 8.8 8.4 8.0  Hemoglobin 11.6 - 15.9 g/dL 11.1(L) 10.8(L) 10.9(L)  Hematocrit 34.8 - 46.6 % 33.1(L) 32.5(L) 33.0(L)  Platelets 145 - 400 10e3/uL 296 324 326   BMET    Component Value Date/Time   NA 140 10/28/2015 1154   K 4.2 10/28/2015 1154   CL 97 08/06/2015 1036   CL 99 07/21/2012 1125   CO2 23 10/28/2015 1154   GLUCOSE 94 10/28/2015 1154   GLUCOSE 184 (H) 07/21/2012 1125   BUN 21.0 10/28/2015 1154    CREATININE 1.2 (H) 10/28/2015 1154   CALCIUM 9.8 10/28/2015 1154   GFRNONAA 42 (L) 08/06/2015 1036   GFRAA 48 (L) 08/06/2015 1036    CT chest 10/27/15 COMPARISON:  08/12/2015  FINDINGS: Cardiovascular: Aortic and branch vessel atherosclerosis. Normal heart size with minimal anterior pericardial fluid or thickening, similar. Multivessel coronary artery atherosclerosis.  Mediastinum/Nodes: No mediastinal or definite hilar adenopathy, given limitations of unenhanced CT. Esophageal fluid level.  Lungs/Pleura: Small right-sided pleural effusion is similar. Right-sided pleural based calcification is adjacent to healing right rib fractures and likely dystrophic. No left-sided pleural fluid.  Left upper lobectomy. Narrowing of left upper lobe bronchi secondary to volume loss, similar.  Moderate centrilobular emphysema. Right upper or middle lobe ground-glass opacity with septal thickening. This measures on the order of 8.4 x 5.9 cm today versus 8.8 x 5.2 cm previously. Slightly more well-defined, with greater ground-glass density today. 2.6 cm on sagittal image 37 in craniocaudal dimension. Similar.  A separate more inferior and lateral right upper lobe pulmonary nodule measures 4 mm on image 75/series 5 and is new. Also sagittal image 31.  Posterior right upper lobe consolidation is likely treatment related and similar. Concurrent volume loss.  Left apical ground-glass nodule measures 6 mm on image 28/series 5 and is either new or more apparent today.  A left lower lobe sub solid pulmonary nodule is unchanged at 7 mm on image 62/series 5  3 mm right lower lobe pulmonary nodule is similar on image 95/series 5.  medial right middle lobe area of probable mucous plugging is similar on image 78/series 5.  Upper Abdomen: Mild hepatic steatosis. Gallstones on the order of 6 mm. Normal imaged portions of the spleen, stomach, pancreas, kidneys. Low-density bilateral  adrenal nodules are similar and likely due to underlying adenomas. Abdominal aortic atherosclerosis.  Musculoskeletal: Postsurgical or posttraumatic right-sided rib defects. Prior vertebral augmentation at T4. T5 mild to moderate compression deformity is similar.  IMPRESSION: 1. The dominant right-sided ground-glass opacity measures similar in size but is slightly more well-defined today, with increased density within. 2. An adjacent satellite 4 mm nodule is new and suspicious for progressive disease. Similarly, a left apical ground-glass nodule is new or increased and suspicious for progressive disease. 3. No thoracic adenopathy. 4. Other pulmonary lesions are similar. 5. Similar volume loss within the posterior right upper lobe with prior left upper lobectomy. 6. Similar small right pleural effusion. 7. Hepatic steatosis. 8.  Coronary artery atherosclerosis. Aortic atherosclerosis. 9. Cholelithiasis. 10. Esophageal air fluid level suggests dysmotility or gastroesophageal reflux.      Assessment & Plan:  COPD (chronic obstructive pulmonary disease) ([place]) Failed Anoro and Spiriva likely because of the powdered formulation. I like to try her on Stiolto to see if she tolerates and benefits. Continue SABA  prn as well.   Primary cancer of right upper lobe of lung ([place]) On immunotherapy. Her most recent CT scan of the chest showed some potential mild progression with groundglass in the left upper lobe and a satellite nodule around her right sided infiltrate. She will be followed with a repeat CT scan on the same regimen, follow with Dr. Glenice Laine, MD, PhD 11/04/2015, 11:23 AM [facility] [phone] or if no answer [phone]

## 2015-11-04 NOTE — Patient Instructions (Addendum)
Please continue your oxygen at 3-4L/min as you are using it Continue your mucinex as you are using it We will try starting Stiolto 2 sprays daily to see if you tolerate and benefit.  Take albuterol (ventolin) 2 puffs up to every 4 hours if needed for shortness of breath.  Get the flu shot in the Fall Follow with Dr Lamonte Sakai in 3 months or sooner if you have any problems. Continue to follow with Dr Julien Nordmann as planned.  Repeat CT scan in several months per Dr Worthy Flank plans.

## 2015-11-04 NOTE — Assessment & Plan Note (Signed)
On immunotherapy. Her most recent CT scan of the chest showed some potential mild progression with groundglass in the left upper lobe and a satellite nodule around her right sided infiltrate. She will be followed with a repeat CT scan on the same regimen, follow with [name]

## 2015-11-05 ENCOUNTER — Other Ambulatory Visit: Payer: Medicare Other

## 2015-11-05 ENCOUNTER — Ambulatory Visit: Payer: Medicare Other | Admitting: Internal Medicine

## 2015-11-05 ENCOUNTER — Ambulatory Visit: Payer: Medicare Other

## 2015-11-06 ENCOUNTER — Encounter (HOSPITAL_COMMUNITY): Payer: Medicare Other

## 2015-11-06 ENCOUNTER — Ambulatory Visit (INDEPENDENT_AMBULATORY_CARE_PROVIDER_SITE_OTHER): Payer: Medicare Other | Admitting: Family

## 2015-11-06 ENCOUNTER — Ambulatory Visit (HOSPITAL_COMMUNITY)
Admission: RE | Admit: 2015-11-06 | Discharge: 2015-11-06 | Disposition: A | Discharge status: Home / Self Care | Payer: Medicare Other | Source: Ambulatory Visit | Attending: Family | Admitting: Family

## 2015-11-06 ENCOUNTER — Ambulatory Visit: Payer: Medicare Other | Admitting: Family

## 2015-11-06 ENCOUNTER — Other Ambulatory Visit: Payer: Self-pay | Admitting: Internal Medicine

## 2015-11-06 ENCOUNTER — Encounter: Payer: Self-pay | Admitting: Family

## 2015-11-06 VITALS — BP 140/58 | HR 76 | Temp 97.8°F | Resp 18 | Ht 66.0 in | Wt 170.0 lb

## 2015-11-06 DIAGNOSIS — J439 Emphysema, unspecified: Secondary | ICD-10-CM | POA: Insufficient documentation

## 2015-11-06 DIAGNOSIS — E1142 Type 2 diabetes mellitus with diabetic polyneuropathy: Secondary | ICD-10-CM | POA: Diagnosis not present

## 2015-11-06 DIAGNOSIS — I779 Disorder of arteries and arterioles, unspecified: Secondary | ICD-10-CM | POA: Insufficient documentation

## 2015-11-06 DIAGNOSIS — I110 Hypertensive heart disease with heart failure: Secondary | ICD-10-CM | POA: Diagnosis not present

## 2015-11-06 DIAGNOSIS — Z87891 Personal history of nicotine dependence: Secondary | ICD-10-CM | POA: Diagnosis not present

## 2015-11-06 DIAGNOSIS — E785 Hyperlipidemia, unspecified: Secondary | ICD-10-CM | POA: Diagnosis not present

## 2015-11-06 DIAGNOSIS — K219 Gastro-esophageal reflux disease without esophagitis: Secondary | ICD-10-CM | POA: Diagnosis not present

## 2015-11-06 DIAGNOSIS — I509 Heart failure, unspecified: Secondary | ICD-10-CM | POA: Insufficient documentation

## 2015-11-06 DIAGNOSIS — R0989 Other specified symptoms and signs involving the circulatory and respiratory systems: Secondary | ICD-10-CM | POA: Diagnosis present

## 2015-11-06 DIAGNOSIS — I2510 Atherosclerotic heart disease of native coronary artery without angina pectoris: Secondary | ICD-10-CM | POA: Diagnosis not present

## 2015-11-06 DIAGNOSIS — F419 Anxiety disorder, unspecified: Secondary | ICD-10-CM | POA: Diagnosis not present

## 2015-11-06 DIAGNOSIS — F329 Major depressive disorder, single episode, unspecified: Secondary | ICD-10-CM | POA: Diagnosis not present

## 2015-11-06 DIAGNOSIS — J449 Chronic obstructive pulmonary disease, unspecified: Secondary | ICD-10-CM | POA: Insufficient documentation

## 2015-11-06 NOTE — Patient Instructions (Signed)
Peripheral Vascular Disease Peripheral vascular disease (PVD) is a disease of the blood vessels that are not part of your heart and brain. A simple term for PVD is poor circulation. In most cases, PVD narrows the blood vessels that carry blood from your heart to the rest of your body. This can result in a decreased supply of blood to your arms, legs, and internal organs, like your stomach or kidneys. However, it most often affects a person's lower legs and feet. There are two types of PVD.  Organic PVD. This is the more common type. It is caused by damage to the structure of blood vessels.  Functional PVD. This is caused by conditions that make blood vessels contract and tighten (spasm). Without treatment, PVD tends to get worse over time. PVD can also lead to acute ischemic limb. This is when an arm or limb suddenly has trouble getting enough blood. This is a medical emergency. CAUSES Each type of PVD has many different causes. The most common cause of PVD is buildup of a fatty material (plaque) inside of your arteries (atherosclerosis). Small amounts of plaque can break off from the walls of the blood vessels and become lodged in a smaller artery. This blocks blood flow and can cause acute ischemic limb. Other common causes of PVD include:  Blood clots that form inside of blood vessels.  Injuries to blood vessels.  Diseases that cause inflammation of blood vessels or cause blood vessel spasms.  Health behaviors and health history that increase your risk of developing PVD. RISK FACTORS  You may have a greater risk of PVD if you:  Have a family history of PVD.  Have certain medical conditions, including:  High cholesterol.  Diabetes.  High blood pressure (hypertension).  Coronary heart disease.  Past problems with blood clots.  Past injury, such as burns or a broken bone. These may have damaged blood vessels in your limbs.  Buerger disease. This is caused by inflamed blood  vessels in your hands and feet.  Some forms of arthritis.  Rare birth defects that affect the arteries in your legs.  Use tobacco.  Do not get enough exercise.  Are obese.  Are age 50 or older. SIGNS AND SYMPTOMS  PVD may cause many different symptoms. Your symptoms depend on what part of your body is not getting enough blood. Some common signs and symptoms include:  Cramps in your lower legs. This may be a symptom of poor leg circulation (claudication).  Pain and weakness in your legs while you are physically active that goes away when you rest (intermittent claudication).  Leg pain when at rest.  Leg numbness, tingling, or weakness.  Coldness in a leg or foot, especially when compared with the other leg.  Skin or hair changes. These can include:  Hair loss.  Shiny skin.  Pale or bluish skin.  Thick toenails.  Inability to get or maintain an erection (erectile dysfunction). People with PVD are more prone to developing ulcers and sores on their toes, feet, or legs. These may take longer than normal to heal. DIAGNOSIS Your health care provider may diagnose PVD from your signs and symptoms. The health care provider will also do a physical exam. You may have tests to find out what is causing your PVD and determine its severity. Tests may include:  Blood pressure recordings from your arms and legs and measurements of the strength of your pulses (pulse volume recordings).  Imaging studies using sound waves to take pictures of   the blood flow through your blood vessels (Doppler ultrasound).  Injecting a dye into your blood vessels before having imaging studies using:  X-rays (angiogram or arteriogram).  Computer-generated X-rays (CT angiogram).  A powerful electromagnetic field and a computer (magnetic resonance angiogram or MRA). TREATMENT Treatment for PVD depends on the cause of your condition and the severity of your symptoms. It also depends on your age. Underlying  causes need to be treated and controlled. These include long-lasting (chronic) conditions, such as diabetes, high cholesterol, and high blood pressure. You may need to first try making lifestyle changes and taking medicines. Surgery may be needed if these do not work. Lifestyle changes may include:  Quitting smoking.  Exercising regularly.  Following a low-fat, low-cholesterol diet. Medicines may include:  Blood thinners to prevent blood clots.  Medicines to improve blood flow.  Medicines to improve your blood cholesterol levels. Surgical procedures may include:  A procedure that uses an inflated balloon to open a blocked artery and improve blood flow (angioplasty).  A procedure to put in a tube (stent) to keep a blocked artery open (stent implant).  Surgery to reroute blood flow around a blocked artery (peripheral bypass surgery).  Surgery to remove dead tissue from an infected wound on the affected limb.  Amputation. This is surgical removal of the affected limb. This may be necessary in cases of acute ischemic limb that are not improved through medical or surgical treatments. HOME CARE INSTRUCTIONS  Take medicines only as directed by your health care provider.  Do not use any tobacco products, including cigarettes, chewing tobacco, or electronic cigarettes. If you need help quitting, ask your health care provider.  Lose weight if you are overweight, and maintain a healthy weight as directed by your health care provider.  Eat a diet that is low in fat and cholesterol. If you need help, ask your health care provider.  Exercise regularly. Ask your health care provider to suggest some good activities for you.  Use compression stockings or other mechanical devices as directed by your health care provider.  Take good care of your feet.  Wear comfortable shoes that fit well.  Check your feet often for any cuts or sores. SEEK MEDICAL CARE IF:  You have cramps in your legs  while walking.  You have leg pain when you are at rest.  You have coldness in a leg or foot.  Your skin changes.  You have erectile dysfunction.  You have cuts or sores on your feet that are not healing. SEEK IMMEDIATE MEDICAL CARE IF:  Your arm or leg turns cold and blue.  Your arms or legs become red, warm, swollen, painful, or numb.  You have chest pain or trouble breathing.  You suddenly have weakness in your face, arm, or leg.  You become very confused or lose the ability to speak.  You suddenly have a very bad headache or lose your vision.   This information is not intended to replace advice given to you by your health care provider. Make sure you discuss any questions you have with your health care provider.   Document Released: 05/06/2004 Document Revised: 04/19/2014 Document Reviewed: 09/06/2013 Elsevier Interactive Patient Education 2016 [employer]  

## 2015-11-06 NOTE — Progress Notes (Signed)
[facility] [place]   CC: Follow up peripheral artery occlusive disease  History of Present Illness [name] is a 70 y.o. female patient of Dr. Oneida Alar who returns for followup today regarding peripheral arterial disease. She has been followed since 2011. She was originally seen and found to have mild to moderate bilateral peripheral arterial disease. She has a known left common femoral stenosis and a right common iliac stenosis. This was seen on prior CT Angio. She complains of numbness and tingling from the knee down to the foot bilaterally at times. She does not really describe claudication symptoms. However she is overall fairly inactive consistent recliner most of the day. She is on continuous home oxygen. She has now developed recurrent lung cancer and has recently had radiation and chemotherapy treatments. She also complains of some soreness behind both knees occasionally. She is also noted that she bruises easy. She denies any lower extremity swelling. She had a left total hip replacement several years ago. She does not describe rest pain in the feet. Chronic medical problems include hypertension, hyperlipidemia, coronary artery disease, lung cancer, diabetes, atrial fibrillation, COPD. These are all currently stable.  Her walking is limited by dyspnea knee pain, not claudication symptoms, denies non healing wounds. Her lower legs have felt numb since about 2014.  She elevates her ankles above her heart when in her recliner and does leg exercises daily while sitting. She states that she has known fibromyalgia.   The patient reports New Medical or Surgical History: gets CT scans every 4 months for lung cancer follow up Has gallstones, has occasional pain from her stomach ulcer, post prandial nausea and diarrhea at tmes.  Pt Diabetic: Yes, states in good control, last A1C result on file was Sept. 2015 at 5.7 Pt smoker: former smoker, quit in 2000  Pt meds  include: Statin :yes ASA: Yes, 325 mg daily; advised to decrease to 81 mg enteric coated daily due to her stomach ulcer Other anticoagulants/antiplatelets: no    Past Medical History:  Diagnosis Date  . Acute posthemorrhagic anemia   . Acute sinusitis, unspecified   . Acute upper respiratory infections of unspecified site   . Adenocarcinoma, lung ([place])    upper left lobe  . Anemia in neoplastic disease   . Anemia, unspecified   . Anginal pain ([place])   . Anxiety   . Anxiety state, unspecified   . Arthritis    "all over my body"  . Asthma   . Asymptomatic varicose veins   . Atherosclerosis of native arteries of the extremities, unspecified   . Atrial fibrillation ([place])   . Bronchial pneumonia    history  . 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 Chemotherapy adverse reaction 09/15/11   "makes me light headed and pass out; this is 3rd blood transfusion since going thru it"  . CHF (congestive heart failure) ([place])   . Chronic airway obstruction, not elsewhere classified   . Chronic fatigue 08/19/2015  . Chronic sinusitis   . Complication of anesthesia    slow to wake up  . COPD (chronic obstructive pulmonary disease) ([place])   . Coronary artery disease    Dr. Burt Knack had stress test done  . DDD (degenerative disc disease), lumbar   . Depressive disorder, not elsewhere classified   . Diseases of lips   . Disorder of bone and cartilage, unspecified   . Dizziness and giddiness   . DVT (deep venous thrombosis) ([place])   .  Dysrhythmia    irregular  . Effects of radiation, unspecified   . Emphysema   . Encounter for antineoplastic immunotherapy 08/19/2015  . Fibromyalgia   . Functional urinary incontinence   . GERD (gastroesophageal reflux disease)   . Hard of hearing, right   . Heart murmur   . Herpes zoster without mention of complication   . Hip pain, chronic 09/17/2014  . History of blood clots    "2 in my aorta"  . History of blood transfusion   . History of bronchitis   .  Hx of radiation therapy 04/28/11 -06/08/11   right upper lobe-lung  . Hyperlipidemia   . Hyperpotassemia   . Hypertension   . Hypopotassemia   . Kidney infection    "related to chemo"  . Lumbago   . Malignant neoplasm of bronchus and lung, unspecified site   . Mental disorder   . Muscle weakness (generalized)   . Muscle weakness (generalized)   . Non-small cell lung cancer ([place]) 03/31/2011   Adenocarcinoma Rt upper lobe mass  . Osteoarthrosis, unspecified whether generalized or localized, unspecified site   . Osteoporosis   . Other abnormal blood chemistry   . Other and unspecified hyperlipidemia   . PAD (peripheral artery disease) ([place])   . Pain in thoracic spine   . Peripheral vascular disease ([place])   . 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 Primary cancer of right upper lobe of lung ([place]) 02/08/2011   Biopsy Rt upper lobe mass 03/31/11>>adenocarcinoma DIAGNOSIS: Stage IIB/IIIA non-small cell lung cancer consistent with adenocarcinoma diagnosed in December 2012.   PRIOR THERAPY:  1) Status post radiation therapy to the right upper lobe lung mass completed on 06/04/2011 under the care of Dr. Pablo Ledger.  2) Systemic chemotherapy with carboplatin for AUC of 5 and Alimta 500 mg/M2 giving every 3   . Reflux   . Reflux esophagitis   . Respiratory failure with hypoxia 01/02/2010  . Restless legs syndrome (RLS)   . Scoliosis   . Screening for thyroid disorder   . Shortness of breath    "anytime really"  . Sinus headache    "all the time"  . Spasm of muscle   . Syncope and collapse   . Type II or unspecified type diabetes mellitus with neurological manifestations, uncontrolled   . Type II or unspecified type diabetes mellitus without mention of complication, not stated as uncontrolled   . Type II or unspecified type diabetes mellitus without mention of complication, not stated as uncontrolled   . Unspecified constipation   . Unspecified essential hypertension   . Unspecified hearing  loss   . Unspecified hereditary and idiopathic peripheral neuropathy   . Unspecified vitamin D deficiency   . Urinary tract infection, site not specified     Social History Social History  Substance Use Topics  . Smoking status: Former Smoker    Packs/day: 1.50    Years: 40.00    Types: Cigarettes    Quit date: 02/10/1997  . Smokeless tobacco: Never Used  . Alcohol use No    Family History Family History  Problem Relation Age of Onset  . Bone cancer Father   . Cancer Father     Bone  . Diabetes Mother   . Deep vein thrombosis Mother   . Heart disease Mother     Heart Disease before age 62-  PVD  . Hyperlipidemia Mother   . Hypertension Mother   . Varicose Veins Mother   . Arthritis Daughter  Past Surgical History:  Procedure Laterality Date  . BRONCHOSCOPY  02/2011  . CARDIAC CATHETERIZATION    . CATARACT EXTRACTION  03/12/2013   Left Eye   . DILATION AND CURETTAGE OF UTERUS  1980's  . EYE SURGERY Left 2014   Cataract  . FETAL BLOOD TRANSFUSION  09/16/11  . JOINT REPLACEMENT  04/13/10   Left Hip  . LUNG LOBECTOMY  1986   left  . SEPTOPLASTY    . TOTAL HIP ARTHROPLASTY  01/2010   left  . TUBAL LIGATION  1980's    Allergies  Allergen Reactions  . Iohexol Shortness Of Breath and Other (See Comments)    "burns me up inside"    Pt does fine with 13 hour prep  01/17/12  . Other     CAN'T STAND THE SMELL OF PURE BLEACH; "HAVE TO BACK UP AND POUR AND DON'T BREATH IT TIL i GET CAP BACK ON; DILUTE IT TO SPRAY"  . Anoro Ellipta [Umeclidinium-Vilanterol] Swelling    Swelling of lips and mouth  . Codeine Itching and Nausea And Vomiting    REACTION: GI upset  . Protonix [Pantoprazole Sodium] Diarrhea    Current Outpatient Prescriptions  Medication Sig Dispense Refill  . ALPRAZolam (XANAX) 1 MG tablet     . aspirin 81 MG tablet Take 81 mg by mouth daily.    . citalopram (CELEXA) 20 MG tablet TAKE 1 TABLET BY MOUTH EVERY DAY TO HELP MOOD 90 tablet 1  .  cyproheptadine (PERIACTIN) 4 MG tablet Take 1 tablet (4 mg total) by mouth 2 (two) times daily. For itching 30 tablet 0  . diltiazem (CARDIZEM CD) 240 MG 24 hr capsule TAKE 1 CAPSULE BY MOUTH EVERY DAY FOR BLOOD PRESSURE. 30 capsule 1  . fluticasone (FLONASE) 50 MCG/ACT nasal spray INSTILL 2 SPRAYS INTO BOTH NOSTRILS  TWICE DAILY *NEEDS APPOINTMENT* 16 g 5  . glucose blood (FREESTYLE LITE) test strip Use to test blood sugar three times daily. Dx: E11.9, E11.49 100 each 12  . HYDROcodone-acetaminophen (NORCO) 10-325 MG tablet Take One tablet by mouth every 6 hours if needed for pain 120 tablet 0  . Lancets (FREESTYLE) lancets Use to test blood sugar three times daily. Dx: E11.9, E11.49 100 each 12  . loratadine (CLARITIN) 10 MG tablet TAKE 1 TABLET BY MOUTH EVERY DAY 30 tablet 3  . metFORMIN (GLUCOPHAGE) 500 MG tablet Take by mouth. Take one tablet at night for blood sugar    . Multiple Vitamins-Minerals (CENTRUM SILVER PO) Take 1 tablet by mouth daily. Patient unsure of dose    . NON FORMULARY Place 3 L into the nose daily. Oxygen    . potassium chloride (K-DUR,KLOR-CON) 10 MEQ tablet TAKE 1 TABLET BY MOUTH EVERY DAY 30 tablet 5  . traMADol (ULTRAM) 50 MG tablet TAKE 1 TABLET BY MOUTH THREE TIMES A DAY AS NEEDED FOR PAIN 90 tablet 5  . triamterene-hydrochlorothiazide (MAXZIDE-25) 37.5-25 MG tablet TAKE 1 TABLET BY MOUTH EVERY DAY TO CONTROL BLOOD PRESSURE 90 tablet 2   No current facility-administered medications for this visit.    Facility-Administered Medications Ordered in Other Visits  Medication Dose Route Frequency Provider Last Rate Last Dose  . hyaluronate sodium (RADIAPLEXRX) gel   Topical Once Thea Silversmith, MD      . topical emolient (BIAFINE) emulsion   Topical PRN Thea Silversmith, MD        ROS: See HPI for pertinent positives and negatives.   Physical Examination  Vitals:   11/06/15  1229  BP: (!) 140/58  Pulse: 76  Resp: 18  Temp: 97.8 F (36.6 C)  TempSrc: Oral   SpO2: 97%  Weight: 170 lb (77.1 kg)  Height: '5\' 6"'$  (1.676 m)   Body mass index is 27.44 kg/m.  General: A&O x 3, WDWN. Gait: using cane Eyes: pupils equal Pulmonary: Respirations are slightly labored at rest, wearing nasal canula attached to supplemental oxygen, fairly good air movement, no wheezes, rales, or rhonchi. Cardiac: regular rhythm, no detected murmur.     Carotid Bruits Left Right   Negative Negative  Aorta is not palpable. Radial pulses: are 2+ palpable and =   VASCULAR EXAM: Extremities without ischemic changes  without Gangrene; without open wounds.     LE Pulses LEFT RIGHT   FEMORAL not palpable 2+ palpable    POPLITEAL not palpable  not palpable   POSTERIOR TIBIAL not palpable  not palpable    DORSALIS PEDIS  ANTERIOR TIBIAL 1+palpable  faintly palpable    Abdomen: soft, NT, no palpable masses. Skin: no rashes, no ulcers. Musculoskeletal: no muscle wasting or atrophy. trace non pitting edema in lower legs and ankles.Tender to touch in multiple areas palpated.  Neurologic: A&O X 3; Appropriate Affect,; MOTOR FUNCTION: moving all extremities equally, motor strength 4/5 throughout. Speech is fluent/normal. CN 2-12 grossly intact.       ASSESSMENT: REAGEN GOATES is a 70 y.o. female who presents for follow up of her mild peripheral artery disease. She has a known left common femoral stenosis and a right common iliac stenosis. This was seen on prior CT Angio. She does not seem to describe claudication symptoms, she has no signs of ischemia or tissue loss in her lower extremities.   Left ABI decreased by 16% to mild decrease in arterial perfusion, right decreased by 14% to moderate decrease in arterial perfusion. Waveforms decreased  to all biphasic from mostly triphasic. TBI's remain stable  She feels she has been walking more. I discussed this with Dr. Oneida Alar.  PLAN:  Graduated walking program in a safe environment. Daily seated leg exercises discussed and demonstrated.  Based on the patient's vascular studies and examination, and after discussing with Dr. Oneida Alar, pt will return to clinic in 6 months with ABI's.  I discussed in depth with the patient the nature of atherosclerosis, and emphasized the importance of maximal medical management including strict control of blood pressure, blood glucose, and lipid levels, obtaining regular exercise, and continued cessation of smoking.  The patient is aware that without maximal medical management the underlying atherosclerotic disease process will progress, limiting the benefit of any interventions.  The patient was given information about PAD including signs, symptoms, treatment, what symptoms should prompt the patient to seek immediate medical care, and risk reduction measures to take.  Clemon Chambers, RN, MSN, FNP-C [facility] Phone: [phone]  Clinic MD: [facility]  11/06/15 12:48 PM

## 2015-11-12 ENCOUNTER — Ambulatory Visit (HOSPITAL_BASED_OUTPATIENT_CLINIC_OR_DEPARTMENT_OTHER): Payer: Medicare Other | Admitting: Internal Medicine

## 2015-11-12 ENCOUNTER — Encounter: Payer: Self-pay | Admitting: Internal Medicine

## 2015-11-12 ENCOUNTER — Other Ambulatory Visit (HOSPITAL_BASED_OUTPATIENT_CLINIC_OR_DEPARTMENT_OTHER): Payer: Medicare Other

## 2015-11-12 ENCOUNTER — Ambulatory Visit (HOSPITAL_BASED_OUTPATIENT_CLINIC_OR_DEPARTMENT_OTHER): Payer: Medicare Other

## 2015-11-12 ENCOUNTER — Telehealth: Payer: Self-pay | Admitting: Internal Medicine

## 2015-11-12 VITALS — BP 155/50 | HR 83 | Temp 98.8°F | Resp 18 | Ht 66.0 in | Wt 171.7 lb

## 2015-11-12 DIAGNOSIS — Z5112 Encounter for antineoplastic immunotherapy: Secondary | ICD-10-CM

## 2015-11-12 DIAGNOSIS — C3411 Malignant neoplasm of upper lobe, right bronchus or lung: Secondary | ICD-10-CM

## 2015-11-12 DIAGNOSIS — C3491 Malignant neoplasm of unspecified part of right bronchus or lung: Secondary | ICD-10-CM

## 2015-11-12 LAB — COMPREHENSIVE METABOLIC PANEL
ALBUMIN: 3.8 g/dL (ref 3.5–5.0)
ALK PHOS: 129 U/L (ref 40–150)
ALT: 10 U/L (ref 0–55)
AST: 18 U/L (ref 5–34)
Anion Gap: 11 mEq/L (ref 3–11)
BILIRUBIN TOTAL: 0.4 mg/dL (ref 0.20–1.20)
BUN: 20.5 mg/dL (ref 7.0–26.0)
CO2: 27 meq/L (ref 22–29)
CREATININE: 1.2 mg/dL — AB (ref 0.6–1.1)
Calcium: 10.4 mg/dL (ref 8.4–10.4)
Chloride: 103 mEq/L (ref 98–109)
EGFR: 47 mL/min/{1.73_m2} — AB (ref >90)
GLUCOSE: 112 mg/dL (ref 70–140)
Potassium: 3.8 mEq/L (ref 3.5–5.1)
SODIUM: 142 meq/L (ref 136–145)
TOTAL PROTEIN: 7.3 g/dL (ref 6.4–8.3)

## 2015-11-12 LAB — CBC WITH DIFFERENTIAL/PLATELET
BASO%: 1.1 % (ref 0.0–2.0)
Basophils Absolute: 0.1 10*3/uL (ref 0.0–0.1)
EOS ABS: 1.3 10*3/uL — AB (ref 0.0–0.5)
EOS%: 15.6 % — ABNORMAL HIGH (ref 0.0–7.0)
HCT: 35.2 % (ref 34.8–46.6)
HEMOGLOBIN: 11.6 g/dL (ref 11.6–15.9)
LYMPH%: 17.3 % (ref 14.0–49.7)
MCH: 30.7 pg (ref 25.1–34.0)
MCHC: 32.9 g/dL (ref 31.5–36.0)
MCV: 93.2 fL (ref 79.5–101.0)
MONO#: 0.7 10*3/uL (ref 0.1–0.9)
MONO%: 8.9 % (ref 0.0–14.0)
NEUT%: 57.1 % (ref 38.4–76.8)
NEUTROS ABS: 4.6 10*3/uL (ref 1.5–6.5)
Platelets: 312 10*3/uL (ref 145–400)
RBC: 3.78 10*6/uL (ref 3.70–5.45)
RDW: 13.1 % (ref 11.2–14.5)
WBC: 8.1 10*3/uL (ref 3.9–10.3)
lymph#: 1.4 10*3/uL (ref 0.9–3.3)

## 2015-11-12 MED ORDER — ALTEPLASE 2 MG IJ SOLR
2.0000 mg | Freq: Once | INTRAMUSCULAR | Status: DC | PRN
  Filled 2015-11-12: qty 2

## 2015-11-12 MED ORDER — SODIUM CHLORIDE 0.9 % IV SOLN
240.0000 mg | Freq: Once | INTRAVENOUS | Status: AC
  Administered 2015-11-12: 240 mg via INTRAVENOUS
  Filled 2015-11-12: qty 20

## 2015-11-12 MED ORDER — SODIUM CHLORIDE 0.9% FLUSH
10.0000 mL | INTRAVENOUS | Status: DC | PRN
  Filled 2015-11-12: qty 10

## 2015-11-12 MED ORDER — HEPARIN SOD (PORK) LOCK FLUSH 100 UNIT/ML IV SOLN
500.0000 [IU] | Freq: Once | INTRAVENOUS | Status: DC | PRN
  Filled 2015-11-12: qty 5

## 2015-11-12 MED ORDER — SODIUM CHLORIDE 0.9% FLUSH
3.0000 mL | INTRAVENOUS | Status: DC | PRN
Start: 2015-11-12 — End: 2015-11-12
  Filled 2015-11-12: qty 10

## 2015-11-12 MED ORDER — HEPARIN SOD (PORK) LOCK FLUSH 100 UNIT/ML IV SOLN
250.0000 [IU] | Freq: Once | INTRAVENOUS | Status: DC | PRN
  Filled 2015-11-12: qty 5

## 2015-11-12 MED ORDER — SODIUM CHLORIDE 0.9 % IV SOLN
Freq: Once | INTRAVENOUS | Status: AC
  Administered 2015-11-12: 13:00:00 via INTRAVENOUS

## 2015-11-12 NOTE — Telephone Encounter (Signed)
Pt will get updated sched in tx room °

## 2015-11-12 NOTE — Progress Notes (Signed)
[facility] [phone]   [phone]  OFFICE PROGRESS NOTE  Kathleen Hubert, MD [phone] [address]  DIAGNOSIS: Progressive non-small cell lung cancer initially diagnosed as Stage IIB/IIIA non-small cell lung cancer consistent with adenocarcinoma with negative EGFR mutation and negative ALK gene translocation diagnosed in December 2012.   PRIOR THERAPY:  1) Status post radiation therapy to the right upper lobe lung mass completed on 06/04/2011 under the care of Dr. Pablo Ledger.  2) Systemic chemotherapy with carboplatin for AUC of 5 and Alimta 500 mg/M2 giving every 3 weeks, status post 3 cycles, last dose was given on 07/21/2011.  3) Systemic chemotherapy with carboplatin for AUC of 5 and Alimta 500 MG/M2 every 3 weeks. First dose on 01/02/2015. Status post 2 cycles, last dose was given 01/30/2015. Her treatment is currently on hold secondary to intolerance.  CURRENT THERAPY: Immunotherapy with Nivolumab 240 MG every 2 weeks. First dose 08/27/2015. Status post 5 cycles  INTERVAL HISTORY: [name] 70 y.o. female returns to the clinic today for follow up visit. She is currently on treatment with immunotherapy with Nivolumab status post 5 cycles. She tolerated the last cycle of her treatment fairly well with no significant adverse effects. She is feeling fine today except for the baseline shortness of breath and currently on home oxygen 2 L/m. She denied having any significant weight loss or night sweats. She has no nausea or vomiting, no fever or chills. She is here today to start cycle #6.  MEDICAL HISTORY: Past Medical History:  Diagnosis Date  . Acute posthemorrhagic anemia   . Acute sinusitis, unspecified   . Acute upper respiratory infections of unspecified site   . Adenocarcinoma, lung ([place])    upper left lobe  . Anemia in neoplastic disease   . Anemia, unspecified   . Anginal pain ([place])   . Anxiety   . Anxiety state,  unspecified   . Arthritis    "all over my body"  . Asthma   . Asymptomatic varicose veins   . Atherosclerosis of native arteries of the extremities, unspecified   . Atrial fibrillation ([place])   . Bronchial pneumonia    history  . 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 Chemotherapy adverse reaction 09/15/11   "makes me light headed and pass out; this is 3rd blood transfusion since going thru it"  . CHF (congestive heart failure) ([place])   . Chronic airway obstruction, not elsewhere classified   . Chronic fatigue 08/19/2015  . Chronic sinusitis   . Complication of anesthesia    slow to wake up  . COPD (chronic obstructive pulmonary disease) ([place])   . Coronary artery disease    Dr. Burt Knack had stress test done  . DDD (degenerative disc disease), lumbar   . Depressive disorder, not elsewhere classified   . Diseases of lips   . Disorder of bone and cartilage, unspecified   . Dizziness and giddiness   . DVT (deep venous thrombosis) ([place])   . Dysrhythmia    irregular  . Effects of radiation, unspecified   . Emphysema   . Encounter for antineoplastic immunotherapy 08/19/2015  . Fibromyalgia   . Functional urinary incontinence   . GERD (gastroesophageal reflux disease)   . Hard of hearing, right   . Heart murmur   . Herpes zoster without mention of complication   . Hip pain, chronic 09/17/2014  . History of blood clots    "  2 in my aorta"  . History of blood transfusion   . History of bronchitis   . Hx of radiation therapy 04/28/11 -06/08/11   right upper lobe-lung  . Hyperlipidemia   . Hyperpotassemia   . Hypertension   . Hypopotassemia   . Kidney infection    "related to chemo"  . Lumbago   . Malignant neoplasm of bronchus and lung, unspecified site   . Mental disorder   . Muscle weakness (generalized)   . Muscle weakness (generalized)   . Non-small cell lung cancer ([place]) 03/31/2011   Adenocarcinoma Rt upper lobe mass  . Osteoarthrosis, unspecified whether generalized or  localized, unspecified site   . Osteoporosis   . Other abnormal blood chemistry   . Other and unspecified hyperlipidemia   . PAD (peripheral artery disease) ([place])   . Pain in thoracic spine   . Peripheral vascular disease ([place])   . 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 Primary cancer of right upper lobe of lung ([place]) 02/08/2011   Biopsy Rt upper lobe mass 03/31/11>>adenocarcinoma DIAGNOSIS: Stage IIB/IIIA non-small cell lung cancer consistent with adenocarcinoma diagnosed in December 2012.   PRIOR THERAPY:  1) Status post radiation therapy to the right upper lobe lung mass completed on 06/04/2011 under the care of Dr. Pablo Ledger.  2) Systemic chemotherapy with carboplatin for AUC of 5 and Alimta 500 mg/M2 giving every 3   . Reflux   . Reflux esophagitis   . Respiratory failure with hypoxia 01/02/2010  . Restless legs syndrome (RLS)   . Scoliosis   . Screening for thyroid disorder   . Shortness of breath    "anytime really"  . Sinus headache    "all the time"  . Spasm of muscle   . Syncope and collapse   . Type II or unspecified type diabetes mellitus with neurological manifestations, uncontrolled   . Type II or unspecified type diabetes mellitus without mention of complication, not stated as uncontrolled   . Type II or unspecified type diabetes mellitus without mention of complication, not stated as uncontrolled   . Unspecified constipation   . Unspecified essential hypertension   . Unspecified hearing loss   . Unspecified hereditary and idiopathic peripheral neuropathy   . Unspecified vitamin D deficiency   . Urinary tract infection, site not specified     ALLERGIES:  is allergic to iohexol; other; anoro ellipta [umeclidinium-vilanterol]; codeine; and protonix [pantoprazole sodium].  MEDICATIONS:  Current Outpatient Prescriptions  Medication Sig Dispense Refill  . ALPRAZolam (XANAX) 1 MG tablet     . aspirin 81 MG tablet Take 81 mg by mouth daily.    . citalopram (CELEXA) 20  MG tablet TAKE 1 TABLET BY MOUTH EVERY DAY TO HELP MOOD 90 tablet 1  . cyproheptadine (PERIACTIN) 4 MG tablet Take 1 tablet (4 mg total) by mouth 2 (two) times daily. For itching 30 tablet 0  . diltiazem (CARDIZEM CD) 240 MG 24 hr capsule TAKE 1 CAPSULE BY MOUTH EVERY DAY FOR BLOOD PRESSURE. 30 capsule 1  . fluticasone (FLONASE) 50 MCG/ACT nasal spray INSTILL 2 SPRAYS INTO BOTH NOSTRILS  TWICE DAILY *NEEDS APPOINTMENT* 16 g 5  . glucose blood (FREESTYLE LITE) test strip Use to test blood sugar three times daily. Dx: E11.9, E11.49 100 each 12  . HEMATINIC/FOLIC ACID 778-2 MG TABS TAKE 1 TABLET BY MOUTH EVERY DAY 30 each 2  . HYDROcodone-acetaminophen (NORCO) 10-325 MG tablet Take One tablet by mouth every 6 hours if needed for pain 120 tablet  0  . Lancets (FREESTYLE) lancets Use to test blood sugar three times daily. Dx: E11.9, E11.49 100 each 12  . loratadine (CLARITIN) 10 MG tablet TAKE 1 TABLET BY MOUTH EVERY DAY 30 tablet 3  . metFORMIN (GLUCOPHAGE) 500 MG tablet Take by mouth. Take one tablet at night for blood sugar    . Multiple Vitamins-Minerals (CENTRUM SILVER PO) Take 1 tablet by mouth daily. Patient unsure of dose    . NON FORMULARY Place 3 L into the nose daily. Oxygen    . potassium chloride (K-DUR,KLOR-CON) 10 MEQ tablet TAKE 1 TABLET BY MOUTH EVERY DAY 30 tablet 5  . traMADol (ULTRAM) 50 MG tablet TAKE 1 TABLET BY MOUTH THREE TIMES A DAY AS NEEDED FOR PAIN 90 tablet 5  . triamterene-hydrochlorothiazide (MAXZIDE-25) 37.5-25 MG tablet TAKE 1 TABLET BY MOUTH EVERY DAY TO CONTROL BLOOD PRESSURE 90 tablet 2   No current facility-administered medications for this visit.    Facility-Administered Medications Ordered in Other Visits  Medication Dose Route Frequency Provider Last Rate Last Dose  . hyaluronate sodium (RADIAPLEXRX) gel   Topical Once Thea Silversmith, MD      . topical emolient (BIAFINE) emulsion   Topical PRN Thea Silversmith, MD        SURGICAL HISTORY:  Past Surgical  History:  Procedure Laterality Date  . BRONCHOSCOPY  02/2011  . CARDIAC CATHETERIZATION    . CATARACT EXTRACTION  03/12/2013   Left Eye   . DILATION AND CURETTAGE OF UTERUS  1980's  . EYE SURGERY Left 2014   Cataract  . FETAL BLOOD TRANSFUSION  09/16/11  . JOINT REPLACEMENT  04/13/10   Left Hip  . LUNG LOBECTOMY  1986   left  . SEPTOPLASTY    . TOTAL HIP ARTHROPLASTY  01/2010   left  . TUBAL LIGATION  1980's    REVIEW OF SYSTEMS:  A comprehensive review of systems was negative except for: Constitutional: positive for fatigue Respiratory: positive for dyspnea on exertion   PHYSICAL EXAMINATION: General appearance: alert, cooperative, fatigued and no distress Head: Normocephalic, without obvious abnormality, atraumatic Neck: no adenopathy, no JVD, supple, symmetrical, trachea midline and thyroid not enlarged, symmetric, no tenderness/mass/nodules Lymph nodes: Cervical, supraclavicular, and axillary nodes normal. Resp: clear to auscultation bilaterally Back: symmetric, no curvature. ROM normal. No CVA tenderness. Cardio: regular rate and rhythm, S1, S2 normal, no murmur, click, rub or gallop GI: soft, non-tender; bowel sounds normal; no masses,  no organomegaly Extremities: extremities normal, atraumatic, no cyanosis or edema Neurologic: Alert and oriented X 3, normal strength and tone. Normal symmetric reflexes. Normal coordination and gait  ECOG PERFORMANCE STATUS: 1 - Symptomatic but completely ambulatory  There were no vitals taken for this visit.  LABORATORY DATA: Lab Results  Component Value Date   WBC 8.8 10/28/2015   HGB 11.1 (L) 10/28/2015   HCT 33.1 (L) 10/28/2015   MCV 92.7 10/28/2015   PLT 296 10/28/2015      Chemistry      Component Value Date/Time   NA 140 10/28/2015 1154   K 4.2 10/28/2015 1154   CL 97 08/06/2015 1036   CL 99 07/21/2012 1125   CO2 23 10/28/2015 1154   BUN 21.0 10/28/2015 1154   CREATININE 1.2 (H) 10/28/2015 1154      Component Value  Date/Time   CALCIUM 9.8 10/28/2015 1154   ALKPHOS 125 10/28/2015 1154   AST 18 10/28/2015 1154   ALT 10 10/28/2015 1154   BILITOT 0.36 10/28/2015 1154  RADIOGRAPHIC STUDIES: Ct Chest Wo Contrast  Result Date: 10/27/2015 CLINICAL DATA:  Right-sided lung cancer diagnosed 2012. Restaging. Left-sided lung cancer diagnosed 31 years ago. Radiation therapy complete. Right upper lobe primary. Ongoing chemotherapy. EXAM: CT CHEST WITHOUT CONTRAST TECHNIQUE: Multidetector CT imaging of the chest was performed following the standard protocol without IV contrast. COMPARISON:  08/12/2015 FINDINGS: Cardiovascular: Aortic and branch vessel atherosclerosis. Normal heart size with minimal anterior pericardial fluid or thickening, similar. Multivessel coronary artery atherosclerosis. Mediastinum/Nodes: No mediastinal or definite hilar adenopathy, given limitations of unenhanced CT. Esophageal fluid level. Lungs/Pleura: Small right-sided pleural effusion is similar. Right-sided pleural based calcification is adjacent to healing right rib fractures and likely dystrophic. No left-sided pleural fluid. Left upper lobectomy. Narrowing of left upper lobe bronchi secondary to volume loss, similar. Moderate centrilobular emphysema. Right upper or middle lobe ground-glass opacity with septal thickening. This measures on the order of 8.4 x 5.9 cm today versus 8.8 x 5.2 cm previously. Slightly more well-defined, with greater ground-glass density today. 2.6 cm on sagittal image 37 in craniocaudal dimension. Similar. A separate more inferior and lateral right upper lobe pulmonary nodule measures 4 mm on image 75/series 5 and is new. Also sagittal image 31. Posterior right upper lobe consolidation is likely treatment related and similar. Concurrent volume loss. Left apical ground-glass nodule measures 6 mm on image 28/series 5 and is either new or more apparent today. A left lower lobe sub solid pulmonary nodule is unchanged at 7  mm on image 62/series 5 3 mm right lower lobe pulmonary nodule is similar on image 95/series 5. medial right middle lobe area of probable mucous plugging is similar on image 78/series 5. Upper Abdomen: Mild hepatic steatosis. Gallstones on the order of 6 mm. Normal imaged portions of the spleen, stomach, pancreas, kidneys. Low-density bilateral adrenal nodules are similar and likely due to underlying adenomas. Abdominal aortic atherosclerosis. Musculoskeletal: Postsurgical or posttraumatic right-sided rib defects. Prior vertebral augmentation at T4. T5 mild to moderate compression deformity is similar. IMPRESSION: 1. The dominant right-sided ground-glass opacity measures similar in size but is slightly more well-defined today, with increased density within. 2. An adjacent satellite 4 mm nodule is new and suspicious for progressive disease. Similarly, a left apical ground-glass nodule is new or increased and suspicious for progressive disease. 3. No thoracic adenopathy. 4. Other pulmonary lesions are similar. 5. Similar volume loss within the posterior right upper lobe with prior left upper lobectomy. 6. Similar small right pleural effusion. 7. Hepatic steatosis. 8.  Coronary artery atherosclerosis. Aortic atherosclerosis. 9. Cholelithiasis. 10. Esophageal air fluid level suggests dysmotility or gastroesophageal reflux. Electronically Signed   By: Abigail Miyamoto M.D.   On: 10/27/2015 17:22   ASSESSMENT AND PLAN: this is a very pleasant 70 years old white female with history of stage IIB/IIIA non-small cell lung cancer status post concurrent chemoradiation followed by consolidation chemotherapy with carboplatin and Alimta and has been observation since April 2013. She has no evidence for disease progression on his recent scan except for a continuous enlargement of the right upper lobe opacity. The molecular study for EGFR mutation was reported to be negative. The patient was started on treatment with systemic  chemotherapy with carboplatin and Alimta status post 2 cycles but her treatment has been on hold secondary to increasing fatigue and weakness as well as pancytopenia. The restaging scan of the chest performed on 04/07/2016 showed mild improvement of her disease. I discussed the scan results with the patient and her daughter. I recommended for  her to continue on observation for now with repeat CT scan of the chest in 3 months for restaging of her disease. The CT scan of the chest performed on 08/12/2015 showed evidence for disease progression in the right lung. The patient is currently on treatment with immunotherapy with Nivolumab status post 5 cycles and tolerating her treatment well.  I recommended for her to continue her treatment with Nivolumab as a schedule. She will receive cycle #6 today. The patient would come back for follow-up visit in 2 weeks for reevaluation before the next cycle of her treatment. The patient and her daughter agreed to the current plan. She was advised to call immediately if she has any concerning symptoms in the interval. The patient voices understanding of current disease status and treatment options and is in agreement with the current care plan.  All questions were answered. The patient knows to call the clinic with any problems, questions or concerns. We can certainly see the patient much sooner if necessary.  Disclaimer: This note was dictated with voice recognition software. Similar sounding words can inadvertently be transcribed and may not be corrected upon review.

## 2015-11-21 ENCOUNTER — Telehealth: Payer: Self-pay | Admitting: Emergency Medicine

## 2015-11-21 MED ORDER — PREDNISONE 10 MG PO TABS: ORAL_TABLET | ORAL | 0 refills | Status: DC

## 2015-11-21 MED ORDER — DOXYCYCLINE HYCLATE 100 MG PO TABS: 100.0000 mg | ORAL_TABLET | Freq: Two times a day (BID) | ORAL | 0 refills | Status: DC

## 2015-11-21 NOTE — Telephone Encounter (Signed)
Doxycycline 100 mg twice a day for 7 days Prednisone > Take '40mg'$  daily for 3 days, then '30mg'$  daily for 3 days, then '20mg'$  daily for 3 days, then '10mg'$  daily for 3 days, then stop

## 2015-11-21 NOTE — Telephone Encounter (Signed)
Called and spoke with pt and she is aware of RB recs and she is aware of the meds that have been sent to her pharmacy. Nothing further is needed.

## 2015-11-21 NOTE — Telephone Encounter (Signed)
Spoke with pt, c/o chills, headache, sinus congestion, chest congestion, prod cough with yellow/green mucus, sob with exertion.   S/s present X4-5 days.  Pt has been taking claritin to help with s/s.  Pt denies fever, nausea, chest pain.   Pt uses [facility] on [address].    RB please advise on further recs.  Thanks!

## 2015-11-24 ENCOUNTER — Telehealth: Payer: Self-pay | Admitting: Emergency Medicine

## 2015-11-24 MED ORDER — TIOTROPIUM BROMIDE-OLODATEROL 2.5-2.5 MCG/ACT IN AERS: 2.0000 | INHALATION_SPRAY | Freq: Every day | RESPIRATORY_TRACT | 5 refills | Status: DC

## 2015-11-24 NOTE — Telephone Encounter (Signed)
Spoke with pt. She needs a prescription sent in for Stiolto. Rx has been sent in. Nothing further was needed.

## 2015-11-26 ENCOUNTER — Ambulatory Visit (HOSPITAL_BASED_OUTPATIENT_CLINIC_OR_DEPARTMENT_OTHER): Payer: Medicare Other

## 2015-11-26 ENCOUNTER — Other Ambulatory Visit (HOSPITAL_BASED_OUTPATIENT_CLINIC_OR_DEPARTMENT_OTHER): Payer: Medicare Other

## 2015-11-26 ENCOUNTER — Ambulatory Visit (HOSPITAL_BASED_OUTPATIENT_CLINIC_OR_DEPARTMENT_OTHER): Payer: Medicare Other | Admitting: Nurse Practitioner

## 2015-11-26 VITALS — BP 164/46 | HR 95 | Temp 98.3°F | Resp 17 | Ht 66.0 in | Wt 174.3 lb

## 2015-11-26 DIAGNOSIS — C3491 Malignant neoplasm of unspecified part of right bronchus or lung: Secondary | ICD-10-CM

## 2015-11-26 DIAGNOSIS — Z5112 Encounter for antineoplastic immunotherapy: Secondary | ICD-10-CM

## 2015-11-26 DIAGNOSIS — R531 Weakness: Secondary | ICD-10-CM | POA: Diagnosis not present

## 2015-11-26 DIAGNOSIS — R530 Neoplastic (malignant) related fatigue: Secondary | ICD-10-CM | POA: Diagnosis not present

## 2015-11-26 DIAGNOSIS — D61810 Antineoplastic chemotherapy induced pancytopenia: Secondary | ICD-10-CM

## 2015-11-26 DIAGNOSIS — R5382 Chronic fatigue, unspecified: Secondary | ICD-10-CM

## 2015-11-26 DIAGNOSIS — C3411 Malignant neoplasm of upper lobe, right bronchus or lung: Secondary | ICD-10-CM | POA: Diagnosis not present

## 2015-11-26 LAB — COMPREHENSIVE METABOLIC PANEL
ALT: 11 U/L (ref 0–55)
AST: 17 U/L (ref 5–34)
Albumin: 3.8 g/dL (ref 3.5–5.0)
Alkaline Phosphatase: 134 U/L (ref 40–150)
Anion Gap: 13 mEq/L — ABNORMAL HIGH (ref 3–11)
BUN: 24.8 mg/dL (ref 7.0–26.0)
CHLORIDE: 104 meq/L (ref 98–109)
CO2: 26 meq/L (ref 22–29)
CREATININE: 1.2 mg/dL — AB (ref 0.6–1.1)
Calcium: 10.6 mg/dL — ABNORMAL HIGH (ref 8.4–10.4)
EGFR: 45 mL/min/{1.73_m2} — ABNORMAL LOW (ref >90)
GLUCOSE: 86 mg/dL (ref 70–140)
POTASSIUM: 3.7 meq/L (ref 3.5–5.1)
SODIUM: 142 meq/L (ref 136–145)
Total Bilirubin: 0.31 mg/dL (ref 0.20–1.20)
Total Protein: 7.3 g/dL (ref 6.4–8.3)

## 2015-11-26 LAB — CBC WITH DIFFERENTIAL/PLATELET
BASO%: 0.6 % (ref 0.0–2.0)
Basophils Absolute: 0.1 10*3/uL (ref 0.0–0.1)
EOS%: 6.2 % (ref 0.0–7.0)
Eosinophils Absolute: 0.7 10*3/uL — ABNORMAL HIGH (ref 0.0–0.5)
HCT: 32.9 % — ABNORMAL LOW (ref 34.8–46.6)
HGB: 10.9 g/dL — ABNORMAL LOW (ref 11.6–15.9)
LYMPH%: 20.8 % (ref 14.0–49.7)
MCH: 30.8 pg (ref 25.1–34.0)
MCHC: 33.1 g/dL (ref 31.5–36.0)
MCV: 92.9 fL (ref 79.5–101.0)
MONO#: 1.2 10*3/uL — AB (ref 0.1–0.9)
MONO%: 10.4 % (ref 0.0–14.0)
NEUT%: 62 % (ref 38.4–76.8)
NEUTROS ABS: 7.4 10*3/uL — AB (ref 1.5–6.5)
PLATELETS: 383 10*3/uL (ref 145–400)
RBC: 3.54 10*6/uL — AB (ref 3.70–5.45)
RDW: 12.9 % (ref 11.2–14.5)
WBC: 11.8 10*3/uL — AB (ref 3.9–10.3)
lymph#: 2.5 10*3/uL (ref 0.9–3.3)

## 2015-11-26 LAB — TSH: TSH: 2.02 m[IU]/L (ref 0.308–3.960)

## 2015-11-26 MED ORDER — SODIUM CHLORIDE 0.9 % IV SOLN
Freq: Once | INTRAVENOUS | Status: AC
  Administered 2015-11-26: 13:00:00 via INTRAVENOUS

## 2015-11-26 MED ORDER — SODIUM CHLORIDE 0.9 % IV SOLN
240.0000 mg | Freq: Once | INTRAVENOUS | Status: AC
  Administered 2015-11-26: 240 mg via INTRAVENOUS
  Filled 2015-11-26: qty 20

## 2015-11-26 NOTE — Patient Instructions (Signed)
[facility] Discharge Instructions for Patients Receiving Chemotherapy  Today you received the following chemotherapy agents:  Opdivo  To help prevent nausea and vomiting after your treatment, we encourage you to take your nausea medication as prescribed.   If you develop nausea and vomiting that is not controlled by your nausea medication, call the clinic.   BELOW ARE SYMPTOMS THAT SHOULD BE REPORTED IMMEDIATELY:  *FEVER GREATER THAN 100.5 F  *CHILLS WITH OR WITHOUT FEVER  NAUSEA AND VOMITING THAT IS NOT CONTROLLED WITH YOUR NAUSEA MEDICATION  *UNUSUAL SHORTNESS OF BREATH  *UNUSUAL BRUISING OR BLEEDING  TENDERNESS IN MOUTH AND THROAT WITH OR WITHOUT PRESENCE OF ULCERS  *URINARY PROBLEMS  *BOWEL PROBLEMS  UNUSUAL RASH Items with * indicate a potential emergency and should be followed up as soon as possible.  Feel free to call the clinic you have any questions or concerns. The clinic phone number is [phone].  Please show the CHEMO ALERT CARD at check-in to the Emergency Department and triage nurse.   

## 2015-11-26 NOTE — Progress Notes (Signed)
  [facility] OFFICE PROGRESS NOTE   DIAGNOSIS: Progressive non-small cell lung cancer initially diagnosed as Stage IIB/IIIA non-small cell lung cancer consistent with adenocarcinoma with negative EGFR mutation and negative ALK gene translocation diagnosed in December 2012.   PRIOR THERAPY:  1) Status post radiation therapy to the right upper lobe lung mass completed on 06/04/2011 under the care of Dr. Pablo Ledger.  2) Systemic chemotherapy with carboplatin for AUC of 5 and Alimta 500 mg/M2 giving every 3 weeks, status post 3 cycles, last dose was given on 07/21/2011.  3) Systemic chemotherapy with carboplatin for AUC of 5 and Alimta 500 MG/M2 every 3 weeks. First dose on 01/02/2015. Status post 2 cycles, last dose was given 01/30/2015. Her treatment is currently on hold secondary to intolerance.  CURRENT THERAPY: Immunotherapy with Nivolumab 240 MG every 2 weeks. First dose 08/27/2015. Status post 6 cycles  INTERVAL HISTORY:   [name] returns as scheduled. She completed cycle 6 nivolumab 11/12/2015. She denies nausea/vomiting. No mouth sores. No diarrhea. No rash. She has stable shortness of breath. Stable cough. She has noted bilateral ankle swelling for the past 5 days or so. The swelling improves overnight. She reports she is currently completing a course of doxycycline for a "sinus infection". Symptoms are better.  Objective:  Vital signs in last 24 hours:  Blood pressure (!) 164/46, pulse 95, temperature 98.3 F (36.8 C), temperature source Oral, resp. rate 17, height 5' 6" (1.676 m), weight 174 lb 4.8 oz (79.1 kg), SpO2 95 %.    HEENT: No thrush or ulcers. Resp: Lungs clear bilaterally. Cardio: Regular rate and rhythm. GI: Abdomen soft and nontender. No hepatomegaly. Vascular: Trace lower leg edema bilaterally. Calves soft and nontender. Neuro: Alert and oriented.  Skin: No rash.    Lab Results:  Lab Results  Component Value Date   WBC 11.8 (H) 11/26/2015     HGB 10.9 (L) 11/26/2015   HCT 32.9 (L) 11/26/2015   MCV 92.9 11/26/2015   PLT 383 11/26/2015   NEUTROABS 7.4 (H) 11/26/2015    Imaging:  No results found.  Medications: I have reviewed the patient's current medications.  Assessment/Plan: 1. History of stage IIB/IIIA non-small cell lung cancer December 2012 status post concurrent chemoradiation followed by consolidation chemotherapy with carboplatin and Alimta. Molecular study for EGFR mutation negative. Evidence of disease progression with continuous enlargement of a right upper lobe opacity on CT 12/02/2014. She is status post systemic chemotherapy with carboplatin and Alimta x 2 cycles with treatment subsequently placed on hold secondary to increasing fatigue and weakness as well as pancytopenia. Restaging scan of the chest performed on 04/07/2016 showed mild improvement. CT scan of the chest performed on 08/12/2015 showed evidence for disease progression in the right lung. Treatment initiated with nivolumab 08/27/2015. To date she has completed 6 cycles.   Disposition: [name] appears stable. She has completed 6 cycles of nivolumab. Plan to proceed with cycle 7 today as scheduled. She will return for a follow-up visit and cycle 8 in 2 weeks. She will contact the office in the interim with any problems.  Plan reviewed with [name].    Ned Card ANP/GNP-BC   11/26/2015  11:36 AM

## 2015-12-01 ENCOUNTER — Ambulatory Visit: Payer: Medicare Other | Admitting: Emergency Medicine

## 2015-12-03 ENCOUNTER — Other Ambulatory Visit: Payer: Self-pay

## 2015-12-03 ENCOUNTER — Other Ambulatory Visit: Payer: Self-pay | Admitting: Internal Medicine

## 2015-12-03 DIAGNOSIS — G8929 Other chronic pain: Secondary | ICD-10-CM

## 2015-12-03 DIAGNOSIS — G44229 Chronic tension-type headache, not intractable: Secondary | ICD-10-CM

## 2015-12-03 DIAGNOSIS — M5136 Other intervertebral disc degeneration, lumbar region: Secondary | ICD-10-CM

## 2015-12-03 DIAGNOSIS — M25552 Pain in left hip: Secondary | ICD-10-CM

## 2015-12-03 MED ORDER — HYDROCODONE-ACETAMINOPHEN 10-325 MG PO TABS: ORAL_TABLET | ORAL | 0 refills | Status: DC

## 2015-12-05 ENCOUNTER — Telehealth: Payer: Self-pay | Admitting: Emergency Medicine

## 2015-12-05 NOTE — Telephone Encounter (Signed)
Last ov on 11/04/15 Instructions   Patient Instructions    Please continue your oxygen at 3-4L/min as you are using it Continue your mucinex as you are using it We will try starting Stiolto 2 sprays daily to see if you tolerate and benefit.  Take albuterol (ventolin) 2 puffs up to every 4 hours if needed for shortness of breath.  Get the flu shot in the Fall Follow with Dr Lamonte Sakai in 3 months or sooner if you have any problems. Continue to follow with Dr Julien Nordmann as planned.  Repeat CT scan in several months per Dr Worthy Flank plans     Called spoke with pt. She states that she wanted clarification if it was okay to take mucinex while using stiolto. I reviewed RB's last ov note. She voiced understanding and had no further questions.

## 2015-12-05 NOTE — Telephone Encounter (Signed)
Please confirm refills and authorize on the xanax and tramadol.

## 2015-12-09 ENCOUNTER — Telehealth: Payer: Self-pay | Admitting: *Deleted

## 2015-12-09 NOTE — Telephone Encounter (Signed)
Patient called stating that due to a sinus infection (fever max 101/on doxycycline) patient would like to cancel 8/30 appointments. RN suggested keeping appointment so that MD [facility] can evaluate. Patient declined and states that she will keep September appointment. Message sent to MD [facility]. p

## 2015-12-10 ENCOUNTER — Other Ambulatory Visit: Payer: Medicare Other

## 2015-12-10 ENCOUNTER — Ambulatory Visit: Payer: Medicare Other

## 2015-12-10 ENCOUNTER — Ambulatory Visit: Payer: Medicare Other | Admitting: Internal Medicine

## 2015-12-11 ENCOUNTER — Telehealth: Payer: Self-pay | Admitting: Emergency Medicine

## 2015-12-11 MED ORDER — AMOXICILLIN-POT CLAVULANATE 875-125 MG PO TABS: 1.0000 | ORAL_TABLET | Freq: Two times a day (BID) | ORAL | 0 refills | Status: DC

## 2015-12-11 NOTE — Telephone Encounter (Signed)
Prescribe augmentin 875 mg q12 for 5 days Saline nasal washes

## 2015-12-11 NOTE — Telephone Encounter (Signed)
Spoke with the pt  She states she has chronic sinusitis and always has trouble spring and fall  She is c/o ear ache bilaterally, sore throat, nasal congestion and sinus pressure/HA  She states she is taking mucinex but not producing any mucus  She is asking for abx, "I feel to bad to come in today" Will forward to PM in RB's absence Please advise, thanks!  Allergies  Allergen Reactions  . Iohexol Shortness Of Breath and Other (See Comments)    "burns me up inside"    Pt does fine with 13 hour prep  01/17/12  . Other     CAN'T STAND THE SMELL OF PURE BLEACH; "HAVE TO BACK UP AND POUR AND DON'T BREATH IT TIL i GET CAP BACK ON; DILUTE IT TO SPRAY"  . Anoro Ellipta [Umeclidinium-Vilanterol] Swelling    Swelling of lips and mouth  . Codeine Itching and Nausea And Vomiting    REACTION: GI upset  . Protonix [Pantoprazole Sodium] Diarrhea   Can not take pred at this time due to tx for lung CA per pt

## 2015-12-11 NOTE — Telephone Encounter (Signed)
Called and spoke with pt and she is aware of meds called to the pharmacy.  Nothing further is needed.

## 2015-12-19 ENCOUNTER — Telehealth: Payer: Self-pay | Admitting: Emergency Medicine

## 2015-12-19 NOTE — Telephone Encounter (Signed)
Called spoke with patient who reported she is still having sore throat, wheezing and prod cough with white mucus despite finishing Augmentin '875mg'$  on yesterday.  She has been taking Mucinex as well, but was unsure if she could also take Claritin.  Pt feels that her wheezing became more constant since her inhaler changed to Stiolto at the last ov and also stated that she cannot tell a difference in her breathing.  Pt would like it noted that she is on chemotherapy every 2 weeks for lung cancer.  Patient is asking: why is she still sick when she just finished abx?  Can she go back on the Combivent and stop the Stiolto?  Can she take Claritin in addition to her current treatment plan?  RB is not available; will route to doc of the day MW please advise, thank you.

## 2015-12-19 NOTE — Telephone Encounter (Signed)
Then she should let Dr Julien Nordmann advise her on what to do re her breathing meds because we have reached a limit on what we can offer here, esp over the phone.  Ok to stop stiolto but the other option is to continue 2 puffs each am and supplement with the combivent up to every 4 hours prn

## 2015-12-19 NOTE — Telephone Encounter (Signed)
Called spoke with pt. Aware of recs below. nothing further needed

## 2015-12-19 NOTE — Telephone Encounter (Signed)
Called and spoke to pt. Informed her of the recs per MW. Pt states she is unable to take prednisone while undergoing chemotherapy, per [name]. Pt also questioning if she can stop taking Stiolto and start the combivent again.   Dr. Melvyn Novas please advise. Thanks.

## 2015-12-19 NOTE — Telephone Encounter (Signed)
rec Prednisone 10 mg take  4 each am x 2 days,   2 each am x 2 days,  1 each am x 2 days and resume whatever dose she was taking previously   Ok to use clariton prn itching sneezing post nasal drip

## 2015-12-24 ENCOUNTER — Encounter: Payer: Self-pay | Admitting: Internal Medicine

## 2015-12-24 ENCOUNTER — Ambulatory Visit (HOSPITAL_BASED_OUTPATIENT_CLINIC_OR_DEPARTMENT_OTHER): Payer: Medicare Other

## 2015-12-24 ENCOUNTER — Other Ambulatory Visit (HOSPITAL_BASED_OUTPATIENT_CLINIC_OR_DEPARTMENT_OTHER): Payer: Medicare Other

## 2015-12-24 ENCOUNTER — Ambulatory Visit (HOSPITAL_BASED_OUTPATIENT_CLINIC_OR_DEPARTMENT_OTHER): Payer: Medicare Other | Admitting: Internal Medicine

## 2015-12-24 VITALS — BP 161/54 | HR 73 | Temp 97.7°F | Resp 18 | Ht 66.0 in | Wt 171.4 lb

## 2015-12-24 DIAGNOSIS — D61818 Other pancytopenia: Secondary | ICD-10-CM

## 2015-12-24 DIAGNOSIS — R531 Weakness: Secondary | ICD-10-CM | POA: Diagnosis not present

## 2015-12-24 DIAGNOSIS — C3411 Malignant neoplasm of upper lobe, right bronchus or lung: Secondary | ICD-10-CM | POA: Diagnosis not present

## 2015-12-24 DIAGNOSIS — Z5112 Encounter for antineoplastic immunotherapy: Secondary | ICD-10-CM | POA: Diagnosis not present

## 2015-12-24 DIAGNOSIS — R5383 Other fatigue: Secondary | ICD-10-CM

## 2015-12-24 DIAGNOSIS — R5382 Chronic fatigue, unspecified: Secondary | ICD-10-CM

## 2015-12-24 DIAGNOSIS — C3491 Malignant neoplasm of unspecified part of right bronchus or lung: Secondary | ICD-10-CM

## 2015-12-24 LAB — CBC WITH DIFFERENTIAL/PLATELET
BASO%: 0.5 % (ref 0.0–2.0)
BASOS ABS: 0 10*3/uL (ref 0.0–0.1)
EOS ABS: 0.3 10*3/uL (ref 0.0–0.5)
EOS%: 4.1 % (ref 0.0–7.0)
HCT: 35.1 % (ref 34.8–46.6)
HEMOGLOBIN: 11.7 g/dL (ref 11.6–15.9)
LYMPH%: 32.5 % (ref 14.0–49.7)
MCH: 30.3 pg (ref 25.1–34.0)
MCHC: 33.3 g/dL (ref 31.5–36.0)
MCV: 90.9 fL (ref 79.5–101.0)
MONO#: 0.7 10*3/uL (ref 0.1–0.9)
MONO%: 9.3 % (ref 0.0–14.0)
NEUT#: 4.2 10*3/uL (ref 1.5–6.5)
NEUT%: 53.6 % (ref 38.4–76.8)
Platelets: 289 10*3/uL (ref 145–400)
RBC: 3.86 10*6/uL (ref 3.70–5.45)
RDW: 12.6 % (ref 11.2–14.5)
WBC: 7.8 10*3/uL (ref 3.9–10.3)
lymph#: 2.5 10*3/uL (ref 0.9–3.3)

## 2015-12-24 LAB — COMPREHENSIVE METABOLIC PANEL
ALT: 13 U/L (ref 0–55)
ANION GAP: 13 meq/L — AB (ref 3–11)
AST: 21 U/L (ref 5–34)
Albumin: 3.6 g/dL (ref 3.5–5.0)
Alkaline Phosphatase: 123 U/L (ref 40–150)
BUN: 14.8 mg/dL (ref 7.0–26.0)
CHLORIDE: 102 meq/L (ref 98–109)
CO2: 26 meq/L (ref 22–29)
Calcium: 10.4 mg/dL (ref 8.4–10.4)
Creatinine: 1.1 mg/dL (ref 0.6–1.1)
EGFR: 50 mL/min/{1.73_m2} — AB (ref >90)
GLUCOSE: 100 mg/dL (ref 70–140)
POTASSIUM: 3.5 meq/L (ref 3.5–5.1)
SODIUM: 140 meq/L (ref 136–145)
TOTAL PROTEIN: 7.3 g/dL (ref 6.4–8.3)
Total Bilirubin: 0.39 mg/dL (ref 0.20–1.20)

## 2015-12-24 LAB — TSH: TSH: 3.041 m[IU]/L (ref 0.308–3.960)

## 2015-12-24 MED ORDER — SODIUM CHLORIDE 0.9 % IV SOLN
240.0000 mg | Freq: Once | INTRAVENOUS | Status: AC
  Administered 2015-12-24: 240 mg via INTRAVENOUS
  Filled 2015-12-24: qty 4

## 2015-12-24 MED ORDER — SODIUM CHLORIDE 0.9 % IV SOLN
Freq: Once | INTRAVENOUS | Status: AC
  Administered 2015-12-24: 14:00:00 via INTRAVENOUS

## 2015-12-24 NOTE — Progress Notes (Signed)
[name] [phone]   [phone]  OFFICE PROGRESS NOTE  [name], MD [phone] [place] [place] [address]  DIAGNOSIS: Progressive non-small cell lung cancer initially diagnosed as Stage IIB/IIIA non-small cell lung cancer consistent with adenocarcinoma with negative EGFR mutation and negative ALK gene translocation diagnosed in December 2012.   PRIOR THERAPY:  1) Status post radiation therapy to the right upper lobe lung mass completed on 06/04/2011 under the care of Dr. Pablo Ledger.  2) Systemic chemotherapy with carboplatin for AUC of 5 and Alimta 500 mg/M2 giving every 3 weeks, status post 3 cycles, last dose was given on 07/21/2011.  3) Systemic chemotherapy with carboplatin for AUC of 5 and Alimta 500 MG/M2 every 3 weeks. First dose on 01/02/2015. Status post 2 cycles, last dose was given 01/30/2015. Her treatment is currently on hold secondary to intolerance.  CURRENT THERAPY: Immunotherapy with Nivolumab 240 MG every 2 weeks. First dose 08/27/2015. Status post 7 cycles  INTERVAL HISTORY: [name] 70 y.o. female returns to the clinic today for follow up visit. She is currently on treatment with immunotherapy with Nivolumab status post 7 cycles. She tolerated the last cycle of her treatment fairly well with no significant adverse effects. She has been off treatment the last cycle secondary to postnasal drainage and sinus infection. She was treated with doxycycline and most recently with Augmentin by [name]. She continues to have mild cough with the baseline shortness of breath increased with exertion She denied having any significant weight loss or night sweats. She has no nausea or vomiting, no fever or chills. She is here today to start cycle #8.  MEDICAL HISTORY: Past Medical History:  Diagnosis Date  . Acute posthemorrhagic anemia   . Acute sinusitis, unspecified   . Acute upper respiratory infections of unspecified site    . Adenocarcinoma, lung ([place])    upper left lobe  . Anemia in neoplastic disease   . Anemia, unspecified   . Anginal pain ([place])   . Anxiety   . Anxiety state, unspecified   . Arthritis    "all over my body"  . Asthma   . Asymptomatic varicose veins   . Atherosclerosis of native arteries of the extremities, unspecified   . Atrial fibrillation ([place])   . Bronchial pneumonia    history  . 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 Chemotherapy adverse reaction 09/15/11   "makes me light headed and pass out; this is 3rd blood transfusion since going thru it"  . CHF (congestive heart failure) ([place])   . Chronic airway obstruction, not elsewhere classified   . Chronic fatigue 08/19/2015  . Chronic sinusitis   . Complication of anesthesia    slow to wake up  . COPD (chronic obstructive pulmonary disease) ([place])   . Coronary artery disease    Dr. Burt Knack had stress test done  . DDD (degenerative disc disease), lumbar   . Depressive disorder, not elsewhere classified   . Diseases of lips   . Disorder of bone and cartilage, unspecified   . Dizziness and giddiness   . DVT (deep venous thrombosis) ([place])   . Dysrhythmia    irregular  . Effects of radiation, unspecified   . Emphysema   . Encounter for antineoplastic immunotherapy 08/19/2015  . Fibromyalgia   . Functional urinary incontinence   . GERD (gastroesophageal reflux disease)   . Hard of hearing, right   . Heart murmur   .  Herpes zoster without mention of complication   . Hip pain, chronic 09/17/2014  . History of blood clots    "2 in my aorta"  . History of blood transfusion   . History of bronchitis   . Hx of radiation therapy 04/28/11 -06/08/11   right upper lobe-lung  . Hyperlipidemia   . Hyperpotassemia   . Hypertension   . Hypopotassemia   . Kidney infection    "related to chemo"  . Lumbago   . Malignant neoplasm of bronchus and lung, unspecified site   . Mental disorder   . Muscle weakness (generalized)   . Muscle  weakness (generalized)   . Non-small cell lung cancer ([place]) 03/31/2011   Adenocarcinoma Rt upper lobe mass  . Osteoarthrosis, unspecified whether generalized or localized, unspecified site   . Osteoporosis   . Other abnormal blood chemistry   . Other and unspecified hyperlipidemia   . PAD (peripheral artery disease) ([place])   . Pain in thoracic spine   . Peripheral vascular disease ([place])   . 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 Primary cancer of right upper lobe of lung ([place]) 02/08/2011   Biopsy Rt upper lobe mass 03/31/11>>adenocarcinoma DIAGNOSIS: Stage IIB/IIIA non-small cell lung cancer consistent with adenocarcinoma diagnosed in December 2012.   PRIOR THERAPY:  1) Status post radiation therapy to the right upper lobe lung mass completed on 06/04/2011 under the care of Dr. Pablo Ledger.  2) Systemic chemotherapy with carboplatin for AUC of 5 and Alimta 500 mg/M2 giving every 3   . Reflux   . Reflux esophagitis   . Respiratory failure with hypoxia 01/02/2010  . Restless legs syndrome (RLS)   . Scoliosis   . Screening for thyroid disorder   . Shortness of breath    "anytime really"  . Sinus headache    "all the time"  . Spasm of muscle   . Syncope and collapse   . Type II or unspecified type diabetes mellitus with neurological manifestations, uncontrolled   . Type II or unspecified type diabetes mellitus without mention of complication, not stated as uncontrolled   . Type II or unspecified type diabetes mellitus without mention of complication, not stated as uncontrolled   . Unspecified constipation   . Unspecified essential hypertension   . Unspecified hearing loss   . Unspecified hereditary and idiopathic peripheral neuropathy   . Unspecified vitamin D deficiency   . Urinary tract infection, site not specified     ALLERGIES:  is allergic to iohexol; other; anoro ellipta [umeclidinium-vilanterol]; codeine; and protonix [pantoprazole sodium].  MEDICATIONS:  Current Outpatient  Prescriptions  Medication Sig Dispense Refill  . ALPRAZolam (XANAX) 1 MG tablet TAKE 1 TABLET BY MOUTH THREE TIMES A DAY AS NEEDED FOR ANXIETY 90 tablet 4  . aspirin 81 MG tablet Take 81 mg by mouth daily.    . citalopram (CELEXA) 20 MG tablet TAKE 1 TABLET BY MOUTH EVERY DAY TO HELP MOOD 90 tablet 1  . cyproheptadine (PERIACTIN) 4 MG tablet Take 1 tablet (4 mg total) by mouth 2 (two) times daily. For itching 30 tablet 0  . diltiazem (CARDIZEM CD) 240 MG 24 hr capsule TAKE 1 CAPSULE BY MOUTH EVERY DAY FOR BLOOD PRESSURE. 30 capsule 1  . doxycycline (VIBRA-TABS) 100 MG tablet Take 1 tablet (100 mg total) by mouth 2 (two) times daily. 14 tablet 0  . fluticasone (FLONASE) 50 MCG/ACT nasal spray INSTILL 2 SPRAYS INTO BOTH NOSTRILS  TWICE DAILY *NEEDS APPOINTMENT* 16 g 5  . glucose  blood (FREESTYLE LITE) test strip Use to test blood sugar three times daily. Dx: E11.9, E11.49 100 each 12  . HEMATINIC/FOLIC ACID 938-1 MG TABS TAKE 1 TABLET BY MOUTH EVERY DAY 30 each 2  . HYDROcodone-acetaminophen (NORCO) 10-325 MG tablet Take One tablet by mouth every 6 hours if needed for pain 120 tablet 0  . Lancets (FREESTYLE) lancets Use to test blood sugar three times daily. Dx: E11.9, E11.49 100 each 12  . loratadine (CLARITIN) 10 MG tablet TAKE 1 TABLET BY MOUTH EVERY DAY 30 tablet 3  . metFORMIN (GLUCOPHAGE) 500 MG tablet Take by mouth. Take one tablet at night for blood sugar    . Multiple Vitamins-Minerals (CENTRUM SILVER PO) Take 1 tablet by mouth daily. Patient unsure of dose    . NON FORMULARY Place 3 L into the nose daily. Oxygen    . potassium chloride (K-DUR,KLOR-CON) 10 MEQ tablet TAKE 1 TABLET BY MOUTH EVERY DAY 30 tablet 1  . predniSONE (DELTASONE) 10 MG tablet Take 40 mg x  3 days, 30 mg x 3 days, 20 mg x 3 days, 10 mg x 3 days then stop 30 tablet 0  . Tiotropium Bromide-Olodaterol (STIOLTO RESPIMAT) 2.5-2.5 MCG/ACT AERS Inhale 2 puffs into the lungs daily. 1 Inhaler 5  . traMADol (ULTRAM) 50 MG  tablet TAKE 1 TABLET BY MOUTH THREE TIMES A DAY AS NEEDED FOR PAIN 90 tablet 4  . triamterene-hydrochlorothiazide (MAXZIDE-25) 37.5-25 MG tablet TAKE 1 TABLET BY MOUTH EVERY DAY TO CONTROL BLOOD PRESSURE 90 tablet 2   No current facility-administered medications for this visit.    Facility-Administered Medications Ordered in Other Visits  Medication Dose Route Frequency Provider Last Rate Last Dose  . hyaluronate sodium (RADIAPLEXRX) gel   Topical Once Thea Silversmith, MD      . topical emolient (BIAFINE) emulsion   Topical PRN Thea Silversmith, MD        SURGICAL HISTORY:  Past Surgical History:  Procedure Laterality Date  . BRONCHOSCOPY  02/2011  . CARDIAC CATHETERIZATION    . CATARACT EXTRACTION  03/12/2013   Left Eye   . DILATION AND CURETTAGE OF UTERUS  1980's  . EYE SURGERY Left 2014   Cataract  . FETAL BLOOD TRANSFUSION  09/16/11  . JOINT REPLACEMENT  04/13/10   Left Hip  . LUNG LOBECTOMY  1986   left  . SEPTOPLASTY    . TOTAL HIP ARTHROPLASTY  01/2010   left  . TUBAL LIGATION  1980's    REVIEW OF SYSTEMS:  A comprehensive review of systems was negative except for: Constitutional: positive for fatigue Respiratory: positive for cough and dyspnea on exertion   PHYSICAL EXAMINATION: General appearance: alert, cooperative, fatigued and no distress Head: Normocephalic, without obvious abnormality, atraumatic Neck: no adenopathy, no JVD, supple, symmetrical, trachea midline and thyroid not enlarged, symmetric, no tenderness/mass/nodules Lymph nodes: Cervical, supraclavicular, and axillary nodes normal. Resp: clear to auscultation bilaterally Back: symmetric, no curvature. ROM normal. No CVA tenderness. Cardio: regular rate and rhythm, S1, S2 normal, no murmur, click, rub or gallop GI: soft, non-tender; bowel sounds normal; no masses,  no organomegaly Extremities: extremities normal, atraumatic, no cyanosis or edema Neurologic: Alert and oriented X 3, normal strength and tone.  Normal symmetric reflexes. Normal coordination and gait  ECOG PERFORMANCE STATUS: 1 - Symptomatic but completely ambulatory  Blood pressure (!) 161/54, pulse 73, temperature 97.7 F (36.5 C), temperature source Oral, resp. rate 18, height 5' 6"  (1.676 m), weight 171 lb 6.4 oz (77.7 kg),  SpO2 99 %.  LABORATORY DATA: Lab Results  Component Value Date   WBC 7.8 12/24/2015   HGB 11.7 12/24/2015   HCT 35.1 12/24/2015   MCV 90.9 12/24/2015   PLT 289 12/24/2015      Chemistry      Component Value Date/Time   NA 140 12/24/2015 1127   K 3.5 12/24/2015 1127   CL 97 08/06/2015 1036   CL 99 07/21/2012 1125   CO2 26 12/24/2015 1127   BUN 14.8 12/24/2015 1127   CREATININE 1.1 12/24/2015 1127      Component Value Date/Time   CALCIUM 10.4 12/24/2015 1127   ALKPHOS 123 12/24/2015 1127   AST 21 12/24/2015 1127   ALT 13 12/24/2015 1127   BILITOT 0.39 12/24/2015 1127       RADIOGRAPHIC STUDIES: No results found. ASSESSMENT AND PLAN: this is a very pleasant 70 years old white female with history of stage IIB/IIIA non-small cell lung cancer status post concurrent chemoradiation followed by consolidation chemotherapy with carboplatin and Alimta and has been observation since April 2013. She has no evidence for disease progression on his recent scan except for a continuous enlargement of the right upper lobe opacity. The molecular study for EGFR mutation was reported to be negative. The patient was started on treatment with systemic chemotherapy with carboplatin and Alimta status post 2 cycles but her treatment has been on hold secondary to increasing fatigue and weakness as well as pancytopenia. The restaging scan of the chest performed on 04/07/2016 showed mild improvement of her disease. I discussed the scan results with the patient and her daughter. I recommended for her to continue on observation for now with repeat CT scan of the chest in 3 months for restaging of her disease. The CT scan  of the chest performed on 08/12/2015 showed evidence for disease progression in the right lung. The patient is currently on treatment with immunotherapy with Nivolumab status post 7 cycles and tolerating her treatment well.  I recommended for her to continue her treatment with Nivolumab as a schedule. She will receive cycle #8 today. The patient would come back for follow-up visit in 2 weeks for reevaluation before the next cycle of her treatmentAfter repeating CT scan of the chest without contrast for restaging of her disease.Marland Kitchen She was advised to call immediately if she has any concerning symptoms in the interval. The patient voices understanding of current disease status and treatment options and is in agreement with the current care plan.  All questions were answered. The patient knows to call the clinic with any problems, questions or concerns. We can certainly see the patient much sooner if necessary.  Disclaimer: This note was dictated with voice recognition software. Similar sounding words can inadvertently be transcribed and may not be corrected upon review.

## 2015-12-24 NOTE — Patient Instructions (Addendum)
[facility] Discharge Instructions for Patients Receiving Chemotherapy  Today you received the following chemotherapy agents Opdivo.  To help prevent nausea and vomiting after your treatment, we encourage you to take your nausea medication as directed.   If you develop nausea and vomiting that is not controlled by your nausea medication, call the clinic.   BELOW ARE SYMPTOMS THAT SHOULD BE REPORTED IMMEDIATELY:  *FEVER GREATER THAN 100.5 F  *CHILLS WITH OR WITHOUT FEVER  NAUSEA AND VOMITING THAT IS NOT CONTROLLED WITH YOUR NAUSEA MEDICATION  *UNUSUAL SHORTNESS OF BREATH  *UNUSUAL BRUISING OR BLEEDING  TENDERNESS IN MOUTH AND THROAT WITH OR WITHOUT PRESENCE OF ULCERS  *URINARY PROBLEMS  *BOWEL PROBLEMS  UNUSUAL RASH Items with * indicate a potential emergency and should be followed up as soon as possible.  Feel free to call the clinic you have any questions or concerns. The clinic phone number is [phone].  Please show the CHEMO ALERT CARD at check-in to the Emergency Department and triage nurse.    

## 2015-12-29 ENCOUNTER — Other Ambulatory Visit: Payer: Self-pay | Admitting: Internal Medicine

## 2015-12-29 ENCOUNTER — Telehealth: Payer: Self-pay | Admitting: Internal Medicine

## 2015-12-29 NOTE — Telephone Encounter (Signed)
Spoke with patient re next appointment for 9/27. Also confirmed 9/25 ct. Patient to get new schedule 9/27.

## 2016-01-01 ENCOUNTER — Other Ambulatory Visit: Payer: Self-pay | Admitting: Internal Medicine

## 2016-01-01 ENCOUNTER — Other Ambulatory Visit: Payer: Self-pay | Admitting: Nurse Practitioner

## 2016-01-02 ENCOUNTER — Other Ambulatory Visit: Payer: Self-pay | Admitting: *Deleted

## 2016-01-02 ENCOUNTER — Other Ambulatory Visit (HOSPITAL_COMMUNITY): Payer: Self-pay | Admitting: Otolaryngology

## 2016-01-02 DIAGNOSIS — G8929 Other chronic pain: Secondary | ICD-10-CM

## 2016-01-02 DIAGNOSIS — J342 Deviated nasal septum: Secondary | ICD-10-CM | POA: Diagnosis not present

## 2016-01-02 DIAGNOSIS — J320 Chronic maxillary sinusitis: Secondary | ICD-10-CM | POA: Diagnosis not present

## 2016-01-02 DIAGNOSIS — J322 Chronic ethmoidal sinusitis: Secondary | ICD-10-CM | POA: Diagnosis not present

## 2016-01-02 DIAGNOSIS — J329 Chronic sinusitis, unspecified: Secondary | ICD-10-CM

## 2016-01-02 DIAGNOSIS — G44229 Chronic tension-type headache, not intractable: Secondary | ICD-10-CM

## 2016-01-02 DIAGNOSIS — J301 Allergic rhinitis due to pollen: Secondary | ICD-10-CM | POA: Diagnosis not present

## 2016-01-02 DIAGNOSIS — J343 Hypertrophy of nasal turbinates: Secondary | ICD-10-CM | POA: Diagnosis not present

## 2016-01-02 DIAGNOSIS — M5136 Other intervertebral disc degeneration, lumbar region: Secondary | ICD-10-CM

## 2016-01-02 DIAGNOSIS — M25552 Pain in left hip: Secondary | ICD-10-CM

## 2016-01-02 DIAGNOSIS — J3489 Other specified disorders of nose and nasal sinuses: Secondary | ICD-10-CM | POA: Diagnosis not present

## 2016-01-02 MED ORDER — HYDROCODONE-ACETAMINOPHEN 10-325 MG PO TABS: ORAL_TABLET | ORAL | 0 refills | Status: DC

## 2016-01-02 NOTE — Telephone Encounter (Signed)
Patient requested and will pick up 

## 2016-01-05 ENCOUNTER — Other Ambulatory Visit: Payer: Self-pay

## 2016-01-05 ENCOUNTER — Ambulatory Visit (HOSPITAL_COMMUNITY)
Admission: RE | Admit: 2016-01-05 | Discharge: 2016-01-05 | Disposition: A | Discharge status: Home / Self Care | Payer: Medicare Other | Source: Ambulatory Visit | Attending: Internal Medicine | Admitting: Internal Medicine

## 2016-01-05 ENCOUNTER — Telehealth: Payer: Self-pay | Admitting: *Deleted

## 2016-01-05 ENCOUNTER — Ambulatory Visit (HOSPITAL_COMMUNITY)
Admission: RE | Admit: 2016-01-05 | Discharge: 2016-01-05 | Disposition: A | Discharge status: Home / Self Care | Payer: Medicare Other | Source: Ambulatory Visit | Attending: Otolaryngology | Admitting: Otolaryngology

## 2016-01-05 DIAGNOSIS — C3411 Malignant neoplasm of upper lobe, right bronchus or lung: Secondary | ICD-10-CM | POA: Insufficient documentation

## 2016-01-05 DIAGNOSIS — J329 Chronic sinusitis, unspecified: Secondary | ICD-10-CM

## 2016-01-05 DIAGNOSIS — R918 Other nonspecific abnormal finding of lung field: Secondary | ICD-10-CM | POA: Diagnosis not present

## 2016-01-05 DIAGNOSIS — J3489 Other specified disorders of nose and nasal sinuses: Secondary | ICD-10-CM | POA: Diagnosis not present

## 2016-01-05 DIAGNOSIS — I700 Atherosclerosis of aorta: Secondary | ICD-10-CM | POA: Insufficient documentation

## 2016-01-05 DIAGNOSIS — J439 Emphysema, unspecified: Secondary | ICD-10-CM | POA: Insufficient documentation

## 2016-01-05 NOTE — Telephone Encounter (Signed)
"  Can I eat before my scans today?"  No live answer at ext [phone].  Advised she not eat four hours before exam but water and pills will be okay for CT chest w/o contrast. No further questions.

## 2016-01-07 ENCOUNTER — Ambulatory Visit (HOSPITAL_BASED_OUTPATIENT_CLINIC_OR_DEPARTMENT_OTHER): Payer: Medicare Other

## 2016-01-07 ENCOUNTER — Ambulatory Visit (HOSPITAL_BASED_OUTPATIENT_CLINIC_OR_DEPARTMENT_OTHER): Payer: Medicare Other | Admitting: Internal Medicine

## 2016-01-07 ENCOUNTER — Other Ambulatory Visit (HOSPITAL_BASED_OUTPATIENT_CLINIC_OR_DEPARTMENT_OTHER): Payer: Medicare Other

## 2016-01-07 ENCOUNTER — Encounter: Payer: Self-pay | Admitting: Internal Medicine

## 2016-01-07 ENCOUNTER — Telehealth: Payer: Self-pay | Admitting: Internal Medicine

## 2016-01-07 VITALS — BP 139/54 | HR 103 | Temp 98.5°F | Resp 17 | Ht 66.0 in | Wt 169.2 lb

## 2016-01-07 DIAGNOSIS — D61818 Other pancytopenia: Secondary | ICD-10-CM

## 2016-01-07 DIAGNOSIS — J9611 Chronic respiratory failure with hypoxia: Secondary | ICD-10-CM

## 2016-01-07 DIAGNOSIS — C3411 Malignant neoplasm of upper lobe, right bronchus or lung: Secondary | ICD-10-CM | POA: Diagnosis not present

## 2016-01-07 DIAGNOSIS — R5383 Other fatigue: Secondary | ICD-10-CM

## 2016-01-07 DIAGNOSIS — Z5112 Encounter for antineoplastic immunotherapy: Secondary | ICD-10-CM | POA: Diagnosis not present

## 2016-01-07 DIAGNOSIS — R531 Weakness: Secondary | ICD-10-CM | POA: Diagnosis not present

## 2016-01-07 DIAGNOSIS — C3491 Malignant neoplasm of unspecified part of right bronchus or lung: Secondary | ICD-10-CM

## 2016-01-07 LAB — CBC WITH DIFFERENTIAL/PLATELET
BASO%: 0.2 % (ref 0.0–2.0)
Basophils Absolute: 0 10*3/uL (ref 0.0–0.1)
EOS%: 4.3 % (ref 0.0–7.0)
Eosinophils Absolute: 0.4 10*3/uL (ref 0.0–0.5)
HCT: 36.4 % (ref 34.8–46.6)
HGB: 12 g/dL (ref 11.6–15.9)
LYMPH%: 21.6 % (ref 14.0–49.7)
MCH: 29.8 pg (ref 25.1–34.0)
MCHC: 33 g/dL (ref 31.5–36.0)
MCV: 90.3 fL (ref 79.5–101.0)
MONO#: 0.8 10*3/uL (ref 0.1–0.9)
MONO%: 8.6 % (ref 0.0–14.0)
NEUT%: 65.3 % (ref 38.4–76.8)
NEUTROS ABS: 5.9 10*3/uL (ref 1.5–6.5)
Platelets: 324 10*3/uL (ref 145–400)
RBC: 4.03 10*6/uL (ref 3.70–5.45)
RDW: 12.8 % (ref 11.2–14.5)
WBC: 9 10*3/uL (ref 3.9–10.3)
lymph#: 1.9 10*3/uL (ref 0.9–3.3)

## 2016-01-07 LAB — COMPREHENSIVE METABOLIC PANEL
ALBUMIN: 3.7 g/dL (ref 3.5–5.0)
ALK PHOS: 124 U/L (ref 40–150)
ALT: 14 U/L (ref 0–55)
AST: 21 U/L (ref 5–34)
Anion Gap: 16 mEq/L — ABNORMAL HIGH (ref 3–11)
BILIRUBIN TOTAL: 0.44 mg/dL (ref 0.20–1.20)
BUN: 19 mg/dL (ref 7.0–26.0)
CALCIUM: 10.2 mg/dL (ref 8.4–10.4)
CO2: 25 mEq/L (ref 22–29)
Chloride: 100 mEq/L (ref 98–109)
Creatinine: 1.1 mg/dL (ref 0.6–1.1)
EGFR: 52 mL/min/{1.73_m2} — AB (ref >90)
Glucose: 101 mg/dl (ref 70–140)
POTASSIUM: 3.3 meq/L — AB (ref 3.5–5.1)
Sodium: 141 mEq/L (ref 136–145)
TOTAL PROTEIN: 7.3 g/dL (ref 6.4–8.3)

## 2016-01-07 MED ORDER — SODIUM CHLORIDE 0.9 % IV SOLN
Freq: Once | INTRAVENOUS | Status: AC
  Administered 2016-01-07: 13:00:00 via INTRAVENOUS

## 2016-01-07 MED ORDER — SODIUM CHLORIDE 0.9 % IV SOLN
240.0000 mg | Freq: Once | INTRAVENOUS | Status: AC
  Administered 2016-01-07: 240 mg via INTRAVENOUS
  Filled 2016-01-07: qty 4

## 2016-01-07 NOTE — Telephone Encounter (Signed)
Avs report and appointment schedule given to patient, per 01/07/16 los.

## 2016-01-07 NOTE — Patient Instructions (Signed)
[facility] Discharge Instructions for Patients Receiving Chemotherapy  Today you received the following chemotherapy agents:  Nivolumab.  To help prevent nausea and vomiting after your treatment, we encourage you to take your nausea medication as directed.   If you develop nausea and vomiting that is not controlled by your nausea medication, call the clinic.   BELOW ARE SYMPTOMS THAT SHOULD BE REPORTED IMMEDIATELY:  *FEVER GREATER THAN 100.5 F  *CHILLS WITH OR WITHOUT FEVER  NAUSEA AND VOMITING THAT IS NOT CONTROLLED WITH YOUR NAUSEA MEDICATION  *UNUSUAL SHORTNESS OF BREATH  *UNUSUAL BRUISING OR BLEEDING  TENDERNESS IN MOUTH AND THROAT WITH OR WITHOUT PRESENCE OF ULCERS  *URINARY PROBLEMS  *BOWEL PROBLEMS  UNUSUAL RASH Items with * indicate a potential emergency and should be followed up as soon as possible.  Feel free to call the clinic you have any questions or concerns. The clinic phone number is [phone].  Please show the CHEMO ALERT CARD at check-in to the Emergency Department and triage nurse.   

## 2016-01-07 NOTE — Progress Notes (Signed)
[place] [phone]   [phone]  OFFICE PROGRESS NOTE  [name], MD [phone] [place] [address]  DIAGNOSIS: Progressive non-small cell lung cancer initially diagnosed as Stage IIB/IIIA non-small cell lung cancer consistent with adenocarcinoma with negative EGFR mutation and negative ALK gene translocation diagnosed in December 2012.   PRIOR THERAPY:  1) Status post radiation therapy to the right upper lobe lung mass completed on 06/04/2011 under the care of Dr. Pablo Ledger.  2) Systemic chemotherapy with carboplatin for AUC of 5 and Alimta 500 mg/M2 giving every 3 weeks, status post 3 cycles, last dose was given on 07/21/2011.  3) Systemic chemotherapy with carboplatin for AUC of 5 and Alimta 500 MG/M2 every 3 weeks. First dose on 01/02/2015. Status post 2 cycles, last dose was given 01/30/2015. Her treatment is currently on hold secondary to intolerance.  CURRENT THERAPY: Immunotherapy with Nivolumab 240 MG every 2 weeks. First dose 08/27/2015. Status post 8 cycles  INTERVAL HISTORY: [name] 70 y.o. female returns to the clinic today for follow up visit accompanied by her daughter and son. She is currently on treatment with immunotherapy with Nivolumab status post 8 cycles. She tolerated the last cycle of her treatment fairly well with no significant adverse effects. She continues to have mild cough with the baseline shortness of breath increased with exertion She denied having any significant weight loss or night sweats. She has no nausea or vomiting, no fever or chills. She had repeat CT scan of the chest performed recently and she is here for evaluation and discussion of her scan results.   MEDICAL HISTORY: Past Medical History:  Diagnosis Date  . Acute posthemorrhagic anemia   . Acute sinusitis, unspecified   . Acute upper respiratory infections of unspecified site   . Adenocarcinoma, lung ([facility])    upper left lobe  .  Anemia in neoplastic disease   . Anemia, unspecified   . Anginal pain ([place])   . Anxiety   . Anxiety state, unspecified   . Arthritis    "all over my body"  . Asthma   . Asymptomatic varicose veins   . Atherosclerosis of native arteries of the extremities, unspecified   . Atrial fibrillation ([place])   . Bronchial pneumonia    history  . 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 Chemotherapy adverse reaction 09/15/11   "makes me light headed and pass out; this is 3rd blood transfusion since going thru it"  . CHF (congestive heart failure) ([facility])   . Chronic airway obstruction, not elsewhere classified   . Chronic fatigue 08/19/2015  . Chronic sinusitis   . Complication of anesthesia    slow to wake up  . COPD (chronic obstructive pulmonary disease) ([facility])   . Coronary artery disease    Dr. Burt Knack had stress test done  . DDD (degenerative disc disease), lumbar   . Depressive disorder, not elsewhere classified   . Diseases of lips   . Disorder of bone and cartilage, unspecified   . Dizziness and giddiness   . DVT (deep venous thrombosis) ([facility])   . Dysrhythmia    irregular  . Effects of radiation, unspecified   . Emphysema   . Encounter for antineoplastic immunotherapy 08/19/2015  . Fibromyalgia   . Functional urinary incontinence   . GERD (gastroesophageal reflux disease)   . Hard of hearing, right   . Heart murmur   . Herpes zoster without mention of  complication   . Hip pain, chronic 09/17/2014  . History of blood clots    "2 in my aorta"  . History of blood transfusion   . History of bronchitis   . Hx of radiation therapy 04/28/11 -06/08/11   right upper lobe-lung  . Hyperlipidemia   . Hyperpotassemia   . Hypertension   . Hypopotassemia   . Kidney infection    "related to chemo"  . Lumbago   . Malignant neoplasm of bronchus and lung, unspecified site   . Mental disorder   . Muscle weakness (generalized)   . Muscle weakness (generalized)   . Non-small cell lung cancer  ([facility]) 03/31/2011   Adenocarcinoma Rt upper lobe mass  . Osteoarthrosis, unspecified whether generalized or localized, unspecified site   . Osteoporosis   . Other abnormal blood chemistry   . Other and unspecified hyperlipidemia   . PAD (peripheral artery disease) ([facility])   . Pain in thoracic spine   . Peripheral vascular disease ([place])   . 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 Primary cancer of right upper lobe of lung ([facility]) 02/08/2011   Biopsy Rt upper lobe mass 03/31/11>>adenocarcinoma DIAGNOSIS: Stage IIB/IIIA non-small cell lung cancer consistent with adenocarcinoma diagnosed in December 2012.   PRIOR THERAPY:  1) Status post radiation therapy to the right upper lobe lung mass completed on 06/04/2011 under the care of Dr. Pablo Ledger.  2) Systemic chemotherapy with carboplatin for AUC of 5 and Alimta 500 mg/M2 giving every 3   . Reflux   . Reflux esophagitis   . Respiratory failure with hypoxia 01/02/2010  . Restless legs syndrome (RLS)   . Scoliosis   . Screening for thyroid disorder   . Shortness of breath    "anytime really"  . Sinus headache    "all the time"  . Spasm of muscle   . Syncope and collapse   . Type II or unspecified type diabetes mellitus with neurological manifestations, uncontrolled   . Type II or unspecified type diabetes mellitus without mention of complication, not stated as uncontrolled   . Type II or unspecified type diabetes mellitus without mention of complication, not stated as uncontrolled   . Unspecified constipation   . Unspecified essential hypertension   . Unspecified hearing loss   . Unspecified hereditary and idiopathic peripheral neuropathy   . Unspecified vitamin D deficiency   . Urinary tract infection, site not specified     ALLERGIES:  is allergic to iohexol; other; anoro ellipta [umeclidinium-vilanterol]; codeine; and protonix [pantoprazole sodium].  MEDICATIONS:  Current Outpatient Prescriptions  Medication Sig Dispense Refill  .  ALPRAZolam (XANAX) 1 MG tablet TAKE 1 TABLET BY MOUTH THREE TIMES A DAY AS NEEDED FOR ANXIETY 90 tablet 0  . aspirin 81 MG tablet Take 81 mg by mouth daily.    . citalopram (CELEXA) 20 MG tablet TAKE 1 TABLET BY MOUTH EVERY DAY TO HELP MOOD 90 tablet 1  . cyproheptadine (PERIACTIN) 4 MG tablet Take 1 tablet (4 mg total) by mouth 2 (two) times daily. For itching 30 tablet 0  . diltiazem (CARDIZEM CD) 240 MG 24 hr capsule TAKE 1 CAPSULE BY MOUTH EVERY DAY FOR BLOOD PRESSURE. 30 capsule 1  . fluticasone (FLONASE) 50 MCG/ACT nasal spray INSTILL 2 SPRAYS INTO BOTH NOSTRILS  TWICE DAILY *NEEDS APPOINTMENT* 16 g 5  . glucose blood (FREESTYLE LITE) test strip Use to test blood sugar three times daily. Dx: E11.9, E11.49 100 each 12  . HEMATINIC/FOLIC ACID 161-0 MG TABS TAKE  1 TABLET BY MOUTH EVERY DAY 30 each 2  . HYDROcodone-acetaminophen (NORCO) 10-325 MG tablet Take One tablet by mouth every 6 hours if needed for pain 120 tablet 0  . Lancets (FREESTYLE) lancets Use to test blood sugar three times daily. Dx: E11.9, E11.49 100 each 12  . loratadine (CLARITIN) 10 MG tablet TAKE 1 TABLET BY MOUTH EVERY DAY 30 tablet 3  . metFORMIN (GLUCOPHAGE) 500 MG tablet Take by mouth. Take one tablet at night for blood sugar    . Multiple Vitamins-Minerals (CENTRUM SILVER PO) Take 1 tablet by mouth daily. Patient unsure of dose    . NON FORMULARY Place 3 L into the nose daily. Oxygen    . potassium chloride (K-DUR,KLOR-CON) 10 MEQ tablet TAKE 1 TABLET BY MOUTH EVERY DAY 30 tablet 1  . Tiotropium Bromide-Olodaterol (STIOLTO RESPIMAT) 2.5-2.5 MCG/ACT AERS Inhale 2 puffs into the lungs daily. 1 Inhaler 5  . traMADol (ULTRAM) 50 MG tablet TAKE 1 TABLET BY MOUTH THREE TIMES A DAY AS NEEDED FOR PAIN 90 tablet 4  . triamterene-hydrochlorothiazide (MAXZIDE-25) 37.5-25 MG tablet TAKE 1 TABLET BY MOUTH EVERY DAY TO CONTROL BLOOD PRESSURE 90 tablet 2   No current facility-administered medications for this visit.     Facility-Administered Medications Ordered in Other Visits  Medication Dose Route Frequency Provider Last Rate Last Dose  . hyaluronate sodium (RADIAPLEXRX) gel   Topical Once Thea Silversmith, MD      . topical emolient (BIAFINE) emulsion   Topical PRN Thea Silversmith, MD        SURGICAL HISTORY:  Past Surgical History:  Procedure Laterality Date  . BRONCHOSCOPY  02/2011  . CARDIAC CATHETERIZATION    . CATARACT EXTRACTION  03/12/2013   Left Eye   . DILATION AND CURETTAGE OF UTERUS  1980's  . EYE SURGERY Left 2014   Cataract  . FETAL BLOOD TRANSFUSION  09/16/11  . JOINT REPLACEMENT  04/13/10   Left Hip  . LUNG LOBECTOMY  1986   left  . SEPTOPLASTY    . TOTAL HIP ARTHROPLASTY  01/2010   left  . TUBAL LIGATION  1980's    REVIEW OF SYSTEMS:  Constitutional: positive for fatigue Eyes: negative Ears, nose, mouth, throat, and face: negative Respiratory: positive for cough, dyspnea on exertion and wheezing Cardiovascular: negative Gastrointestinal: negative Genitourinary:negative Integument/breast: negative Hematologic/lymphatic: negative Musculoskeletal:negative Neurological: negative Behavioral/Psych: negative Endocrine: negative Allergic/Immunologic: negative   PHYSICAL EXAMINATION: General appearance: alert, cooperative, fatigued and no distress Head: Normocephalic, without obvious abnormality, atraumatic Neck: no adenopathy, no JVD, supple, symmetrical, trachea midline and thyroid not enlarged, symmetric, no tenderness/mass/nodules Lymph nodes: Cervical, supraclavicular, and axillary nodes normal. Resp: clear to auscultation bilaterally Back: symmetric, no curvature. ROM normal. No CVA tenderness. Cardio: regular rate and rhythm, S1, S2 normal, no murmur, click, rub or gallop GI: soft, non-tender; bowel sounds normal; no masses,  no organomegaly Extremities: extremities normal, atraumatic, no cyanosis or edema Neurologic: Alert and oriented X 3, normal strength and tone.  Normal symmetric reflexes. Normal coordination and gait  ECOG PERFORMANCE STATUS: 1 - Symptomatic but completely ambulatory  Blood pressure (!) 139/54, pulse (!) 103, temperature 98.5 F (36.9 C), temperature source Oral, resp. rate 17, height 5' 6" (1.676 m), weight 169 lb 3.2 oz (76.7 kg), SpO2 95 %.  LABORATORY DATA: Lab Results  Component Value Date   WBC 9.0 01/07/2016   HGB 12.0 01/07/2016   HCT 36.4 01/07/2016   MCV 90.3 01/07/2016   PLT 324 01/07/2016  Chemistry      Component Value Date/Time   NA 141 01/07/2016 1109   K 3.3 (L) 01/07/2016 1109   CL 97 08/06/2015 1036   CL 99 07/21/2012 1125   CO2 25 01/07/2016 1109   BUN 19.0 01/07/2016 1109   CREATININE 1.1 01/07/2016 1109      Component Value Date/Time   CALCIUM 10.2 01/07/2016 1109   ALKPHOS 124 01/07/2016 1109   AST 21 01/07/2016 1109   ALT 14 01/07/2016 1109   BILITOT 0.44 01/07/2016 1109       RADIOGRAPHIC STUDIES: Ct Chest Wo Contrast  Result Date: 01/05/2016 CLINICAL DATA:  Restaging right upper lobe lung cancer. History of partial left lung resection. EXAM: CT CHEST WITHOUT CONTRAST TECHNIQUE: Multidetector CT imaging of the chest was performed following the standard protocol without IV contrast. COMPARISON:  Chest CT 10/27/2015 and 08/12/2015. FINDINGS: Cardiovascular: There is atherosclerosis of the aorta, great vessels and coronary arteries. No acute vascular findings are seen on noncontrast imaging. The heart size is normal. There is no pericardial effusion. Mediastinum/Nodes: There are no enlarged mediastinal, hilar or axillary lymph nodes.The right hilum is partly obscured by perihilar scarring/fibrosis, but appears unchanged. The thyroid gland and trachea demonstrate no significant findings. There is a stable small hiatal hernia. Lungs/Pleura: Small right pleural effusion has nearly completely resolved. There are stable postsurgical changes status post left upper lobe resection. There is stable  chronic central bronchial narrowing and distortion bilaterally. Moderate emphysematous changes are present. There is stable chronic volume loss and scarring posteromedially in the right upper lobe. The dominant mixed ground-glass and solid density in the right middle lobe is similar, measuring approximately 8.5 x 5.5 cm on image 69. Surrounding peripheral ground-glass densities in the right middle lobe are also stable. There are stable ground-glass densities in the left lower lobe on images 34 and 70. Upper abdomen: The visualized upper abdomen appears stable. The hepatic density is decreased consistent with steatosis. No evidence of adrenal mass. Calcified gallstones are present. Musculoskeletal/Chest wall: No acute osseous findings or evidence of metastatic disease. There are stable superior endplate compression deformities at T4 and T5 status post spinal augmentation at T4. 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 There are old rib fractures or thoracotomy defects on the right. IMPRESSION: 1. No significant change in the appearance of the lungs. Emphysema, postsurgical changes and scattered predominantly ground-glass densities are stable. 2. No evidence of progressive metastatic disease. 3. Diffuse atherosclerosis. Electronically Signed   By: Richardean Sale M.D.   On: 01/05/2016 17:20   Ct Maxillofacial Wo Contrast  Result Date: 01/05/2016 CLINICAL DATA:  Sinusitis of unspecified chronicity. EXAM: CT MAXILLOFACIAL WITHOUT CONTRAST TECHNIQUE: Multidetector CT imaging of the maxillofacial structures was performed. Multiplanar CT image reconstructions were also generated. A small metallic BB was placed on the right temple in order to reliably differentiate right from left. COMPARISON:  02/02/2011 FINDINGS: Osseous: Remote bilateral nasal arch fractures. No acute fracture or erosive process. Incidental torus palatini Orbits: Bilateral cataract resection. No acute finding or mass.  Symmetric thinned appearance of the lateral rectus. Sinuses: Left maxillary antrostomy which is widely patent. Right maxillary outflow was also patent. Clear frontal ethmoidal and sphenoid ethmoidal recesses. Anterior septal perforation which could be posttraumatic or postsurgical given the above. Anterior paradoxical rotation of the middle turbinates without nasal cavity stenosis. No fluid level or mucosal thickening. Soft tissues: Negative Limited intracranial: Senescent changes including volume loss and atherosclerotic calcification. IMPRESSION: 1. No active sinusitis or sinus  obstruction. 2. Patent left maxillary antrostomy. 3. Chronic anterior nasal septum perforation, potentially posttraumatic given remote nasal arch fractures. Electronically Signed   By: Monte Fantasia M.D.   On: 01/05/2016 17:14   ASSESSMENT AND PLAN: this is a very pleasant 70 years old white female with history of stage IIB/IIIA non-small cell lung cancer status post concurrent chemoradiation followed by consolidation chemotherapy with carboplatin and Alimta and has been observation since April 2013. She has no evidence for disease progression on his recent scan except for a continuous enlargement of the right upper lobe opacity. The molecular study for EGFR mutation was reported to be negative. The patient was started on treatment with systemic chemotherapy with carboplatin and Alimta status post 2 cycles but her treatment has been on hold secondary to increasing fatigue and weakness as well as pancytopenia. The restaging scan of the chest performed on 04/07/2016 showed mild improvement of her disease. I discussed the scan results with the patient and her daughter. I recommended for her to continue on observation for now with repeat CT scan of the chest in 3 months for restaging of her disease. The CT scan of the chest performed on 08/12/2015 showed evidence for disease progression in the right lung. The patient is currently on  treatment with immunotherapy with Nivolumab status post 8 cycles and tolerating her treatment well.  Restaging CT scan of the chest on 01/05/2016 showed no evidence for disease progression. I discussed the scan results with the patient and her family. I recommended for her to continue her treatment with Nivolumab with cycle #9 today as a schedule. The patient would come back for follow-up visit in 2 weeks for reevaluation before starting cycle #10.  She was advised to call immediately if she has any concerning symptoms in the interval. The patient voices understanding of current disease status and treatment options and is in agreement with the current care plan.  All questions were answered. The patient knows to call the clinic with any problems, questions or concerns. We can certainly see the patient much sooner if necessary.  Disclaimer: This note was dictated with voice recognition software. Similar sounding words can inadvertently be transcribed and may not be corrected upon review.

## 2016-01-20 ENCOUNTER — Ambulatory Visit (INDEPENDENT_AMBULATORY_CARE_PROVIDER_SITE_OTHER): Payer: Medicare Other | Admitting: Internal Medicine

## 2016-01-20 ENCOUNTER — Encounter: Payer: Self-pay | Admitting: Internal Medicine

## 2016-01-20 VITALS — BP 146/64 | HR 87 | Temp 98.6°F | Ht 66.0 in | Wt 170.0 lb

## 2016-01-20 DIAGNOSIS — C3411 Malignant neoplasm of upper lobe, right bronchus or lung: Secondary | ICD-10-CM

## 2016-01-20 DIAGNOSIS — D649 Anemia, unspecified: Secondary | ICD-10-CM | POA: Diagnosis not present

## 2016-01-20 DIAGNOSIS — H8110 Benign paroxysmal vertigo, unspecified ear: Secondary | ICD-10-CM | POA: Diagnosis not present

## 2016-01-20 DIAGNOSIS — I779 Disorder of arteries and arterioles, unspecified: Secondary | ICD-10-CM

## 2016-01-20 MED ORDER — CETIRIZINE-PSEUDOEPHEDRINE ER 5-120 MG PO TB12: ORAL_TABLET | ORAL | 1 refills | Status: DC

## 2016-01-20 NOTE — Progress Notes (Signed)
Facility  [facility]    Place of Service:   OFFICE    Allergies  Allergen Reactions  . Iohexol Shortness Of Breath and Other (See Comments)    "burns me up inside"    Pt does fine with 13 hour prep  01/17/12  . Other     CAN'T STAND THE SMELL OF PURE BLEACH; "HAVE TO BACK UP AND POUR AND DON'T BREATH IT TIL i GET CAP BACK ON; DILUTE IT TO SPRAY"  . Anoro Ellipta [Umeclidinium-Vilanterol] Swelling    Swelling of lips and mouth  . Codeine Itching and Nausea And Vomiting    REACTION: GI upset  . Protonix [Pantoprazole Sodium] Diarrhea    Chief Complaint  Patient presents with  . Acute Visit    dizzy -lighthead, worse in morning, Dizzy when she first wakes up still in bed. [place] one day due to lightlead. Worse in morning, some during the day, just not as bad. Here with son Octavia Bruckner.    HPI:  Last seen by me on 10/28/15. Next scheduled visit 02/10/16.  Last seen by Dr Julien Nordmann 01/07/16. Continues on immunotherapy with Nivolumab.  Seen acutely today for dizziness and true vertigo. Lab 01/07/16 showed normal CMP except K+3.3, normal CBC, normal TSH. Completed CT maxillofacial and chest on 01/05/16. Fell off commode last week. Got bruised  Medications: Patient's Medications  New Prescriptions   No medications on file  Previous Medications   ALPRAZOLAM (XANAX) 1 MG TABLET    TAKE 1 TABLET BY MOUTH THREE TIMES A DAY AS NEEDED FOR ANXIETY   ASPIRIN 81 MG TABLET    Take 81 mg by mouth daily.   CITALOPRAM (CELEXA) 20 MG TABLET    TAKE 1 TABLET BY MOUTH EVERY DAY TO HELP MOOD   CYPROHEPTADINE (PERIACTIN) 4 MG TABLET    Take 1 tablet (4 mg total) by mouth 2 (two) times daily. For itching   DILTIAZEM (CARDIZEM CD) 240 MG 24 HR CAPSULE    TAKE 1 CAPSULE BY MOUTH EVERY DAY FOR BLOOD PRESSURE.   FLUTICASONE (FLONASE) 50 MCG/ACT NASAL SPRAY    INSTILL 2 SPRAYS INTO BOTH NOSTRILS  TWICE DAILY *NEEDS APPOINTMENT*   GLUCOSE BLOOD (FREESTYLE LITE) TEST STRIP    Use to test blood sugar three times daily. Dx:  E11.9, O16.07   HEMATINIC/FOLIC ACID 371-0 MG TABS    TAKE 1 TABLET BY MOUTH EVERY DAY   HYDROCODONE-ACETAMINOPHEN (NORCO) 10-325 MG TABLET    Take One tablet by mouth every 6 hours if needed for pain   LANCETS (FREESTYLE) LANCETS    Use to test blood sugar three times daily. Dx: E11.9, E11.49   LORATADINE (CLARITIN) 10 MG TABLET    TAKE 1 TABLET BY MOUTH EVERY DAY   METFORMIN (GLUCOPHAGE) 500 MG TABLET    Take by mouth. Take one tablet at night for blood sugar   MULTIPLE VITAMINS-MINERALS (CENTRUM SILVER PO)    Take 1 tablet by mouth daily. Patient unsure of dose   NON FORMULARY    Place 3 L into the nose daily. Oxygen   POTASSIUM CHLORIDE (K-DUR,KLOR-CON) 10 MEQ TABLET    TAKE 1 TABLET BY MOUTH EVERY DAY   TIOTROPIUM BROMIDE-OLODATEROL (STIOLTO RESPIMAT) 2.5-2.5 MCG/ACT AERS    Inhale 2 puffs into the lungs daily.   TRAMADOL (ULTRAM) 50 MG TABLET    TAKE 1 TABLET BY MOUTH THREE TIMES A DAY AS NEEDED FOR PAIN   TRIAMTERENE-HYDROCHLOROTHIAZIDE (MAXZIDE-25) 37.5-25 MG TABLET    TAKE 1 TABLET  BY MOUTH EVERY DAY TO CONTROL BLOOD PRESSURE  Modified Medications   No medications on file  Discontinued Medications   No medications on file    Review of Systems  Constitutional: Positive for activity change. Negative for chills and fever.  HENT: Positive for postnasal drip, sinus pressure and trouble swallowing. Negative for congestion, ear pain and sore throat.        Popping noise in ears  Eyes: Negative.   Respiratory: Positive for cough and shortness of breath. Negative for choking, chest tightness, wheezing and stridor.        Lung cancer.  Cardiovascular: Negative for chest pain and palpitations.  Gastrointestinal: Positive for constipation. Negative for abdominal distention, abdominal pain, diarrhea and nausea.       Reflux frequently.  Musculoskeletal: Positive for arthralgias, back pain, gait problem, myalgias and neck pain.       Left hip pain  Skin:       Generalized itching and  occasional red bumps.  Neurological: Positive for dizziness (true vertigo), weakness (generalized), light-headedness and headaches. Negative for tremors and syncope.  Hematological:       Chronic anemia  Psychiatric/Behavioral: Positive for dysphoric mood and sleep disturbance. Negative for hallucinations. The patient is not nervous/anxious and is not hyperactive.     Vitals:   01/20/16 1342  BP: (!) 146/64  Pulse: 87  Temp: 98.6 F (37 C)  TempSrc: Oral  SpO2: 96%  Weight: 170 lb (77.1 kg)  Height: 5' 6"  (1.676 m)   Body mass index is 27.44 kg/m. Wt Readings from Last 3 Encounters:  01/20/16 170 lb (77.1 kg)  01/07/16 169 lb 3.2 oz (76.7 kg)  12/24/15 171 lb 6.4 oz (77.7 kg)      Physical Exam  Constitutional: She is oriented to person, place, and time.  overweight  HENT:  Nose: Nose normal.  Mouth/Throat: Oropharynx is clear and moist. No oropharyngeal exudate.  Loss of hearing. Hearing aid left ear.  Eyes: Conjunctivae and EOM are normal. Pupils are equal, round, and reactive to light.  Neck: No JVD present. No tracheal deviation present. No thyromegaly present.  Cardiovascular: Normal rate, regular rhythm and normal heart sounds.  Exam reveals no gallop and no friction rub.   No murmur heard. Pulmonary/Chest: No respiratory distress. She has no wheezes. She has no rales. She exhibits no tenderness.  Abdominal: She exhibits no distension and no mass. There is no tenderness.  Musculoskeletal: Normal range of motion. She exhibits edema and tenderness.  Lymphadenopathy:    She has no cervical adenopathy.  Neurological: She is oriented to person, place, and time. No cranial nerve deficit. Coordination normal.  Skin: No rash noted. No erythema. No pallor.  Psychiatric: Her behavior is normal. Judgment and thought content normal.    Labs reviewed: Lab Summary Latest Ref Rng & Units 01/07/2016 12/24/2015 11/26/2015  Hemoglobin 11.6 - 15.9 g/dL 12.0 11.7 10.9(L)    Hematocrit 34.8 - 46.6 % 36.4 35.1 32.9(L)  White count 3.9 - 10.3 10e3/uL 9.0 7.8 11.8(H)  Platelet count 145 - 400 10e3/uL 324 289 383  Sodium 136 - 145 mEq/L 141 140 142  Potassium 3.5 - 5.1 mEq/L 3.3(L) 3.5 3.7  Calcium 8.4 - 10.4 mg/dL 10.2 10.4 10.6(H)  Phosphorus - (None) (None) (None)  Creatinine 0.6 - 1.1 mg/dL 1.1 1.1 1.2(H)  AST 5 - 34 U/L 21 21 17   Alk Phos 40 - 150 U/L 124 123 134  Bilirubin 0.20 - 1.20 mg/dL 0.44 0.39 0.31  Glucose 70 - 140 mg/dl 101 100 86  Cholesterol - (None) (None) (None)  HDL cholesterol - (None) (None) (None)  Triglycerides - (None) (None) (None)  LDL Direct - (None) (None) (None)  LDL Calc - (None) (None) (None)  Total protein 6.4 - 8.3 g/dL 7.3 7.3 7.3  Albumin 3.5 - 5.0 g/dL 3.7 3.6 3.8  Some recent data might be hidden   Lab Results  Component Value Date   TSH 3.041 12/24/2015   TSH 2.020 11/26/2015   TSH 2.574 10/28/2015   Lab Results  Component Value Date   BUN 19.0 01/07/2016   BUN 14.8 12/24/2015   BUN 24.8 11/26/2015   Lab Results  Component Value Date   HGBA1C 5.5 08/06/2015   HGBA1C 5.7 (H) 12/18/2013   HGBA1C 5.1 09/15/2011  Ct Chest Wo Contrast  Result Date: 01/05/2016 CLINICAL DATA:  Restaging right upper lobe lung cancer. History of partial left lung resection. EXAM: CT CHEST WITHOUT CONTRAST TECHNIQUE: Multidetector CT imaging of the chest was performed following the standard protocol without IV contrast. COMPARISON:  Chest CT 10/27/2015 and 08/12/2015. FINDINGS: Cardiovascular: There is atherosclerosis of the aorta, great vessels and coronary arteries. No acute vascular findings are seen on noncontrast imaging. The heart size is normal. There is no pericardial effusion. Mediastinum/Nodes: There are no enlarged mediastinal, hilar or axillary lymph nodes.The right hilum is partly obscured by perihilar scarring/fibrosis, but appears unchanged. The thyroid gland and trachea demonstrate no significant findings. There is a  stable small hiatal hernia. Lungs/Pleura: Small right pleural effusion has nearly completely resolved. There are stable postsurgical changes status post left upper lobe resection. There is stable chronic central bronchial narrowing and distortion bilaterally. Moderate emphysematous changes are present. There is stable chronic volume loss and scarring posteromedially in the right upper lobe. The dominant mixed ground-glass and solid density in the right middle lobe is similar, measuring approximately 8.5 x 5.5 cm on image 69. Surrounding peripheral ground-glass densities in the right middle lobe are also stable. There are stable ground-glass densities in the left lower lobe on images 34 and 70. Upper abdomen: The visualized upper abdomen appears stable. The hepatic density is decreased consistent with steatosis. No evidence of adrenal mass. Calcified gallstones are present. Musculoskeletal/Chest wall: No acute osseous findings or evidence of metastatic disease. There are stable superior endplate compression deformities at T4 and T5 status post spinal augmentation at T4. 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 There are old rib fractures or thoracotomy defects on the right. IMPRESSION: 1. No significant change in the appearance of the lungs. Emphysema, postsurgical changes and scattered predominantly ground-glass densities are stable. 2. No evidence of progressive metastatic disease. 3. Diffuse atherosclerosis. Electronically Signed   By: Richardean Sale M.D.   On: 01/05/2016 17:20   Ct Maxillofacial Wo Contrast  Result Date: 01/05/2016 CLINICAL DATA:  Sinusitis of unspecified chronicity. EXAM: CT MAXILLOFACIAL WITHOUT CONTRAST TECHNIQUE: Multidetector CT imaging of the maxillofacial structures was performed. Multiplanar CT image reconstructions were also generated. A small metallic BB was placed on the right temple in order to reliably differentiate right from left. COMPARISON:   02/02/2011 FINDINGS: Osseous: Remote bilateral nasal arch fractures. No acute fracture or erosive process. Incidental torus palatini Orbits: Bilateral cataract resection. No acute finding or mass. Symmetric thinned appearance of the lateral rectus. Sinuses: Left maxillary antrostomy which is widely patent. Right maxillary outflow was also patent. Clear frontal ethmoidal and sphenoid ethmoidal recesses. Anterior septal perforation which could be posttraumatic or postsurgical  given the above. Anterior paradoxical rotation of the middle turbinates without nasal cavity stenosis. No fluid level or mucosal thickening. Soft tissues: Negative Limited intracranial: Senescent changes including volume loss and atherosclerotic calcification. IMPRESSION: 1. No active sinusitis or sinus obstruction. 2. Patent left maxillary antrostomy. 3. Chronic anterior nasal septum perforation, potentially posttraumatic given remote nasal arch fractures. Electronically Signed   By: Monte Fantasia M.D.   On: 01/05/2016 17:14    Assessment/Plan  1. Benign paroxysmal positional vertigo, unspecified laterality - Given educational reprint of BPPV from [place] (ZYRTEC-D) 5-120 MG tablet; One twice daily to help dizziness  Dispense: 60 tablet; Refill: 1  2. Anemia, unspecified type resolved  3. Primary cancer of right upper lobe of lung (HCC) Stable. Continue Nivolumab.

## 2016-01-21 ENCOUNTER — Telehealth: Payer: Self-pay | Admitting: Internal Medicine

## 2016-01-21 NOTE — Telephone Encounter (Signed)
I told pt to keep appt.Symptoms have been going on for 6-8 weeks.

## 2016-01-21 NOTE — Telephone Encounter (Signed)
MM PAL - MOVED 10/12 F/U FROM MM TO LT. LAB/TX ADJUSTED ACCORDINGLY. SPOKE WITH PATIENT RE CHANGE AND NEW TIME FOR LAB/LT/TX @ 1:15 PM.   PER PATIENT SHE IS HAVING SOME DIZZINESS AND HAS BEEN TAKING ZYRTEC AND WANTS TO KNOW IF SHE SHOULD HAVE INFUSION TOMORROW. PATIENT FORWARDED TO NURSE TO LEAVE MESSAGE. I ALSO LEFT MESSAGE FOR DESK NURSE.

## 2016-01-22 ENCOUNTER — Other Ambulatory Visit (HOSPITAL_BASED_OUTPATIENT_CLINIC_OR_DEPARTMENT_OTHER): Payer: Medicare Other

## 2016-01-22 ENCOUNTER — Other Ambulatory Visit: Payer: Self-pay | Admitting: Hematology

## 2016-01-22 ENCOUNTER — Telehealth: Payer: Self-pay | Admitting: Internal Medicine

## 2016-01-22 ENCOUNTER — Ambulatory Visit (HOSPITAL_BASED_OUTPATIENT_CLINIC_OR_DEPARTMENT_OTHER): Payer: Medicare Other | Admitting: Nurse Practitioner

## 2016-01-22 ENCOUNTER — Ambulatory Visit (HOSPITAL_BASED_OUTPATIENT_CLINIC_OR_DEPARTMENT_OTHER): Payer: Medicare Other

## 2016-01-22 VITALS — BP 148/46 | HR 105 | Temp 99.0°F | Resp 18 | Ht 66.0 in | Wt 170.3 lb

## 2016-01-22 DIAGNOSIS — B370 Candidal stomatitis: Secondary | ICD-10-CM

## 2016-01-22 DIAGNOSIS — C3411 Malignant neoplasm of upper lobe, right bronchus or lung: Secondary | ICD-10-CM

## 2016-01-22 DIAGNOSIS — Z5112 Encounter for antineoplastic immunotherapy: Secondary | ICD-10-CM

## 2016-01-22 DIAGNOSIS — C3491 Malignant neoplasm of unspecified part of right bronchus or lung: Secondary | ICD-10-CM

## 2016-01-22 DIAGNOSIS — R42 Dizziness and giddiness: Secondary | ICD-10-CM | POA: Diagnosis not present

## 2016-01-22 DIAGNOSIS — Z79899 Other long term (current) drug therapy: Secondary | ICD-10-CM | POA: Diagnosis not present

## 2016-01-22 DIAGNOSIS — Z23 Encounter for immunization: Secondary | ICD-10-CM | POA: Diagnosis not present

## 2016-01-22 DIAGNOSIS — R5382 Chronic fatigue, unspecified: Secondary | ICD-10-CM

## 2016-01-22 LAB — CBC WITH DIFFERENTIAL/PLATELET
BASO%: 0.5 % (ref 0.0–2.0)
BASOS ABS: 0 10*3/uL (ref 0.0–0.1)
EOS ABS: 0.4 10*3/uL (ref 0.0–0.5)
EOS%: 4.3 % (ref 0.0–7.0)
HEMATOCRIT: 34.3 % — AB (ref 34.8–46.6)
HGB: 11.7 g/dL (ref 11.6–15.9)
LYMPH#: 1.6 10*3/uL (ref 0.9–3.3)
LYMPH%: 17.9 % (ref 14.0–49.7)
MCH: 30.3 pg (ref 25.1–34.0)
MCHC: 34.1 g/dL (ref 31.5–36.0)
MCV: 88.9 fL (ref 79.5–101.0)
MONO#: 0.8 10*3/uL (ref 0.1–0.9)
MONO%: 9.2 % (ref 0.0–14.0)
NEUT#: 6 10*3/uL (ref 1.5–6.5)
NEUT%: 68.1 % (ref 38.4–76.8)
PLATELETS: 301 10*3/uL (ref 145–400)
RBC: 3.86 10*6/uL (ref 3.70–5.45)
RDW: 12.9 % (ref 11.2–14.5)
WBC: 8.9 10*3/uL (ref 3.9–10.3)

## 2016-01-22 LAB — COMPREHENSIVE METABOLIC PANEL
ALBUMIN: 3.8 g/dL (ref 3.5–5.0)
ALK PHOS: 135 U/L (ref 40–150)
ALT: 12 U/L (ref 0–55)
ANION GAP: 13 meq/L — AB (ref 3–11)
AST: 22 U/L (ref 5–34)
BUN: 17.4 mg/dL (ref 7.0–26.0)
CALCIUM: 10.6 mg/dL — AB (ref 8.4–10.4)
CO2: 26 mEq/L (ref 22–29)
CREATININE: 1 mg/dL (ref 0.6–1.1)
Chloride: 101 mEq/L (ref 98–109)
EGFR: 54 mL/min/{1.73_m2} — ABNORMAL LOW (ref >90)
Glucose: 96 mg/dl (ref 70–140)
POTASSIUM: 3.4 meq/L — AB (ref 3.5–5.1)
Sodium: 139 mEq/L (ref 136–145)
Total Bilirubin: 0.32 mg/dL (ref 0.20–1.20)
Total Protein: 7.3 g/dL (ref 6.4–8.3)

## 2016-01-22 LAB — TSH: TSH: 1.696 m(IU)/L (ref 0.308–3.960)

## 2016-01-22 MED ORDER — FLUCONAZOLE 100 MG PO TABS: 100.0000 mg | ORAL_TABLET | Freq: Every day | ORAL | 0 refills | Status: DC

## 2016-01-22 MED ORDER — SODIUM CHLORIDE 0.9 % IV SOLN
Freq: Once | INTRAVENOUS | Status: AC
  Administered 2016-01-22: 15:00:00 via INTRAVENOUS

## 2016-01-22 MED ORDER — INFLUENZA VAC SPLIT QUAD 0.5 ML IM SUSY
0.5000 mL | PREFILLED_SYRINGE | Freq: Once | INTRAMUSCULAR | Status: AC
  Administered 2016-01-22: 0.5 mL via INTRAMUSCULAR
  Filled 2016-01-22: qty 0.5

## 2016-01-22 MED ORDER — NIVOLUMAB CHEMO INJECTION 100 MG/10ML
240.0000 mg | Freq: Once | INTRAVENOUS | Status: AC
  Administered 2016-01-22: 240 mg via INTRAVENOUS
  Filled 2016-01-22: qty 20

## 2016-01-22 NOTE — Patient Instructions (Signed)
[facility] Discharge Instructions for Patients Receiving Chemotherapy  Today you received the following chemotherapy agents:  Nivolumab.  To help prevent nausea and vomiting after your treatment, we encourage you to take your nausea medication as directed.   If you develop nausea and vomiting that is not controlled by your nausea medication, call the clinic.   BELOW ARE SYMPTOMS THAT SHOULD BE REPORTED IMMEDIATELY:  *FEVER GREATER THAN 100.5 F  *CHILLS WITH OR WITHOUT FEVER  NAUSEA AND VOMITING THAT IS NOT CONTROLLED WITH YOUR NAUSEA MEDICATION  *UNUSUAL SHORTNESS OF BREATH  *UNUSUAL BRUISING OR BLEEDING  TENDERNESS IN MOUTH AND THROAT WITH OR WITHOUT PRESENCE OF ULCERS  *URINARY PROBLEMS  *BOWEL PROBLEMS  UNUSUAL RASH Items with * indicate a potential emergency and should be followed up as soon as possible.  Feel free to call the clinic you have any questions or concerns. The clinic phone number is [phone].  Please show the CHEMO ALERT CARD at check-in to the Emergency Department and triage nurse.   

## 2016-01-22 NOTE — Telephone Encounter (Signed)
No 10/12 los/orders/referrals °

## 2016-01-22 NOTE — Progress Notes (Signed)
[facility] OFFICE PROGRESS NOTE   DIAGNOSIS: Progressive non-small cell lung cancer initially diagnosed as Stage IIB/IIIA non-small cell lung cancer consistent with adenocarcinoma with negative EGFR mutation and negative ALK gene translocation diagnosed in December 2012.   PRIOR THERAPY:  1) Status post radiation therapy to the right upper lobe lung mass completed on 06/04/2011 under the care of Dr. Pablo Ledger.  2) Systemic chemotherapy with carboplatin for AUC of 5 and Alimta 500 mg/M2 giving every 3 weeks, status post 3 cycles, last dose was given on 07/21/2011.  3) Systemic chemotherapy with carboplatin for AUC of 5 and Alimta 500 MG/M2 every 3 weeks. First dose on 01/02/2015. Status post 2 cycles, last dose was given 01/30/2015. Her treatment is currently on hold secondary to intolerance.  CURRENT THERAPY: Immunotherapy with Nivolumab 240 MG every 2 weeks. First dose 08/27/2015. Status post 9 cycles  INTERVAL HISTORY:   [name] returns as scheduled. She completed cycle 9 nivolumab 01/07/2016. She has occasional mild nausea. She is concerned she has an oral yeast infection following a recent course of antibiotics for a sinus infection. She denies diarrhea. No rash. She has stable dyspnea on exertion. She reports a 6-8 week history of dizziness. The dizziness is present at a low level continuously and then worsens with certain position changes. She is having intermittent headaches. She had a recent fall. She feels like "something" is in her head "that shouldn't be". She denies diplopia.  Objective:  Vital signs in last 24 hours:  Blood pressure (!) 148/46, pulse (!) 105, temperature 99 F (37.2 C), resp. rate 18, height 5' 6"  (1.676 m), weight 170 lb 4.8 oz (77.2 kg), SpO2 96 %.    HEENT: White coating over tongue consistent with thrush. Pupils equal round and reactive to light. Extraocular movements intact. Resp: Lungs clear bilaterally. Cardio: Regular rate and  rhythm. GI: Abdomen soft and nontender. No hepatomegaly. Vascular: No leg edema. Neuro: Alert and oriented. Motor strength 5 over 5. Finger to nose intact.  Skin: No rash.    Lab Results:  Lab Results  Component Value Date   WBC 8.9 01/22/2016   HGB 11.7 01/22/2016   HCT 34.3 (L) 01/22/2016   MCV 88.9 01/22/2016   PLT 301 01/22/2016   NEUTROABS 6.0 01/22/2016    Imaging:  No results found.  Medications: I have reviewed the patient's current medications.  Assessment/Plan: 1. History of stage IIB/IIIA non-small cell lung cancer December 2012 status post concurrent chemoradiation followed by consolidation chemotherapy with carboplatin and Alimta. Molecular study for EGFR mutation negative. Evidence of disease progression with continuous enlargement of a right upper lobe opacity on CT 12/02/2014. She is status post systemic chemotherapy with carboplatin and Alimta x 2 cycles with treatment subsequently placed on hold secondary to increasing fatigue and weakness as well as pancytopenia. Restaging scan of the chest performed on 04/07/2016 showed mild improvement. CT scan of the chest performed on 08/12/2015 showed evidence for disease progression in the right lung. Treatment initiated with nivolumab 08/27/2015. To date she has completed 9 cycles. 2. Dizziness present for the past 6-8 weeks.   Disposition: Ms. Hodgkiss appears stable. She has completed 9 cycles of nivolumab. Restaging chest CT 01/05/2016 showed no evidence of progressive metastatic disease. Plan to proceed with cycle 10 nivolumab today as scheduled.  She has been experiencing dizziness for the past 6-8 weeks. She had a recent fall. We decided to proceed with an MRI of the brain to rule out metastatic disease.  I prescribed Diflucan for oral candidiasis. There is potential for an interaction with Diflucan and Celexa. She reports she is not taking Celexa.  She will return for a follow-up visit and nivolumab in 2 weeks. She  will contact the office in the interim with any problems.  I reviewed the above plan with Dr. Burr Medico in Dr. Worthy Flank absence. 25 minutes were spent face-to-face at today's visit with the majority of that time involved in counseling/coordination of care.  Ned Card ANP/GNP-BC   01/22/2016  2:19 PM

## 2016-01-23 ENCOUNTER — Telehealth: Payer: Self-pay | Admitting: *Deleted

## 2016-01-23 ENCOUNTER — Ambulatory Visit (HOSPITAL_COMMUNITY)
Admission: RE | Admit: 2016-01-23 | Discharge: 2016-01-23 | Disposition: A | Discharge status: Home / Self Care | Payer: Medicare Other | Source: Ambulatory Visit | Attending: Nurse Practitioner | Admitting: Nurse Practitioner

## 2016-01-23 DIAGNOSIS — R51 Headache: Secondary | ICD-10-CM | POA: Insufficient documentation

## 2016-01-23 DIAGNOSIS — R42 Dizziness and giddiness: Secondary | ICD-10-CM | POA: Insufficient documentation

## 2016-01-23 DIAGNOSIS — C3411 Malignant neoplasm of upper lobe, right bronchus or lung: Secondary | ICD-10-CM | POA: Diagnosis not present

## 2016-01-23 MED ORDER — GADOBENATE DIMEGLUMINE 529 MG/ML IV SOLN
16.0000 mL | Freq: Once | INTRAVENOUS | Status: AC | PRN
  Administered 2016-01-23: 16 mL via INTRAVENOUS

## 2016-01-23 NOTE — Telephone Encounter (Signed)
"  I have an appointment tonight for an MRI.  Can I eat before this test?"  Confirmed with MRI department in [facility] are that she may eat and this will not interfere with the test.  Instructed where to go to receive this test.  No further questions.

## 2016-01-27 ENCOUNTER — Other Ambulatory Visit: Payer: Self-pay | Admitting: Nurse Practitioner

## 2016-01-27 ENCOUNTER — Telehealth: Payer: Self-pay

## 2016-01-27 NOTE — Telephone Encounter (Signed)
Called and informed pt of MRI results - no evidence of cancer per Marlynn Perking pt verbalized understanding and denies any questions or concerns at this time.

## 2016-01-28 ENCOUNTER — Other Ambulatory Visit: Payer: Self-pay | Admitting: *Deleted

## 2016-01-28 MED ORDER — ALPRAZOLAM 1 MG PO TABS: ORAL_TABLET | ORAL | 0 refills | Status: DC

## 2016-01-28 NOTE — Telephone Encounter (Signed)
Patient requested refill to be sent to [facility].

## 2016-01-29 ENCOUNTER — Other Ambulatory Visit: Payer: Self-pay | Admitting: Internal Medicine

## 2016-01-30 ENCOUNTER — Other Ambulatory Visit: Payer: Self-pay | Admitting: *Deleted

## 2016-01-30 DIAGNOSIS — M5136 Other intervertebral disc degeneration, lumbar region: Secondary | ICD-10-CM

## 2016-01-30 DIAGNOSIS — G44229 Chronic tension-type headache, not intractable: Secondary | ICD-10-CM

## 2016-01-30 DIAGNOSIS — M25552 Pain in left hip: Secondary | ICD-10-CM

## 2016-01-30 DIAGNOSIS — G8929 Other chronic pain: Secondary | ICD-10-CM

## 2016-01-30 MED ORDER — HYDROCODONE-ACETAMINOPHEN 10-325 MG PO TABS: ORAL_TABLET | ORAL | 0 refills | Status: DC

## 2016-01-30 NOTE — Telephone Encounter (Signed)
Patient requested and [facility] will pick up 

## 2016-02-04 ENCOUNTER — Ambulatory Visit (HOSPITAL_BASED_OUTPATIENT_CLINIC_OR_DEPARTMENT_OTHER): Payer: Medicare Other

## 2016-02-04 ENCOUNTER — Encounter: Payer: Self-pay | Admitting: Internal Medicine

## 2016-02-04 ENCOUNTER — Telehealth: Payer: Self-pay | Admitting: Internal Medicine

## 2016-02-04 ENCOUNTER — Ambulatory Visit (HOSPITAL_BASED_OUTPATIENT_CLINIC_OR_DEPARTMENT_OTHER): Payer: Medicare Other | Admitting: Internal Medicine

## 2016-02-04 ENCOUNTER — Other Ambulatory Visit: Payer: Self-pay | Admitting: Internal Medicine

## 2016-02-04 ENCOUNTER — Other Ambulatory Visit (HOSPITAL_BASED_OUTPATIENT_CLINIC_OR_DEPARTMENT_OTHER): Payer: Medicare Other

## 2016-02-04 VITALS — BP 153/54 | HR 110 | Temp 99.2°F | Resp 19 | Ht 66.0 in | Wt 169.5 lb

## 2016-02-04 DIAGNOSIS — C3411 Malignant neoplasm of upper lobe, right bronchus or lung: Secondary | ICD-10-CM | POA: Diagnosis not present

## 2016-02-04 DIAGNOSIS — R42 Dizziness and giddiness: Secondary | ICD-10-CM | POA: Diagnosis not present

## 2016-02-04 DIAGNOSIS — Z5112 Encounter for antineoplastic immunotherapy: Secondary | ICD-10-CM | POA: Diagnosis not present

## 2016-02-04 DIAGNOSIS — C3491 Malignant neoplasm of unspecified part of right bronchus or lung: Secondary | ICD-10-CM

## 2016-02-04 DIAGNOSIS — R509 Fever, unspecified: Secondary | ICD-10-CM | POA: Diagnosis not present

## 2016-02-04 LAB — COMPREHENSIVE METABOLIC PANEL
ALBUMIN: 4 g/dL (ref 3.5–5.0)
ALK PHOS: 149 U/L (ref 40–150)
ALT: 16 U/L (ref 0–55)
ANION GAP: 14 meq/L — AB (ref 3–11)
AST: 21 U/L (ref 5–34)
BUN: 15.3 mg/dL (ref 7.0–26.0)
CO2: 28 mEq/L (ref 22–29)
Calcium: 10.5 mg/dL — ABNORMAL HIGH (ref 8.4–10.4)
Chloride: 97 mEq/L — ABNORMAL LOW (ref 98–109)
Creatinine: 1.2 mg/dL — ABNORMAL HIGH (ref 0.6–1.1)
EGFR: 45 mL/min/{1.73_m2} — AB (ref >90)
GLUCOSE: 121 mg/dL (ref 70–140)
POTASSIUM: 3.6 meq/L (ref 3.5–5.1)
SODIUM: 139 meq/L (ref 136–145)
Total Bilirubin: 0.43 mg/dL (ref 0.20–1.20)
Total Protein: 7.9 g/dL (ref 6.4–8.3)

## 2016-02-04 LAB — CBC WITH DIFFERENTIAL/PLATELET
BASO%: 0.6 % (ref 0.0–2.0)
BASOS ABS: 0.1 10*3/uL (ref 0.0–0.1)
EOS%: 5.9 % (ref 0.0–7.0)
Eosinophils Absolute: 0.6 10*3/uL — ABNORMAL HIGH (ref 0.0–0.5)
HCT: 38.5 % (ref 34.8–46.6)
HEMOGLOBIN: 13 g/dL (ref 11.6–15.9)
LYMPH%: 20.9 % (ref 14.0–49.7)
MCH: 30.5 pg (ref 25.1–34.0)
MCHC: 33.8 g/dL (ref 31.5–36.0)
MCV: 90.4 fL (ref 79.5–101.0)
MONO#: 0.9 10*3/uL (ref 0.1–0.9)
MONO%: 8.9 % (ref 0.0–14.0)
NEUT#: 6.2 10*3/uL (ref 1.5–6.5)
NEUT%: 63.7 % (ref 38.4–76.8)
PLATELETS: 320 10*3/uL (ref 145–400)
RBC: 4.26 10*6/uL (ref 3.70–5.45)
RDW: 13.1 % (ref 11.2–14.5)
WBC: 9.8 10*3/uL (ref 3.9–10.3)
lymph#: 2.1 10*3/uL (ref 0.9–3.3)

## 2016-02-04 MED ORDER — SODIUM CHLORIDE 0.9 % IV SOLN
240.0000 mg | Freq: Once | INTRAVENOUS | Status: AC
  Administered 2016-02-04: 240 mg via INTRAVENOUS
  Filled 2016-02-04: qty 4

## 2016-02-04 MED ORDER — SODIUM CHLORIDE 0.9 % IV SOLN
Freq: Once | INTRAVENOUS | Status: AC
  Administered 2016-02-04: 15:00:00 via INTRAVENOUS

## 2016-02-04 NOTE — Progress Notes (Signed)
[place] Telephone:([phone]) [phone]   [phone]  OFFICE PROGRESS NOTE  [name], MD [phone] [place] [address]  DIAGNOSIS: Progressive non-small cell lung cancer initially diagnosed as Stage IIB/IIIA non-small cell lung cancer consistent with adenocarcinoma with negative EGFR mutation and negative ALK gene translocation diagnosed in December 2012.   PRIOR THERAPY:  1) Status post radiation therapy to the right upper lobe lung mass completed on 06/04/2011 under the care of Dr. Pablo Ledger.  2) Systemic chemotherapy with carboplatin for AUC of 5 and Alimta 500 mg/M2 giving every 3 weeks, status post 3 cycles, last dose was given on 07/21/2011.  3) Systemic chemotherapy with carboplatin for AUC of 5 and Alimta 500 MG/M2 every 3 weeks. First dose on 01/02/2015. Status post 2 cycles, last dose was given 01/30/2015. Her treatment is currently on hold secondary to intolerance.  CURRENT THERAPY: Immunotherapy with Nivolumab 240 MG every 2 weeks. First dose 08/27/2015. Status post 10 cycles  INTERVAL HISTORY: [name] 70 y.o. female returns to the clinic today for follow up visit accompanied by her daughter and son. She is currently on treatment with immunotherapy with Nivolumab status post 10 cycles. She tolerated the last cycle of her treatment fairly well with no significant adverse effects. She has occasional dizzy spells with the changing of her position. She also has low-grade temperature today. She continues to have mild cough with the baseline shortness of breath increased with exertion She denied having any significant weight loss or night sweats. She has no nausea or vomiting, no fever or chills. She is here today for evaluation before starting cycle #11.  MEDICAL HISTORY: Past Medical History:  Diagnosis Date  . Acute posthemorrhagic anemia   . Acute sinusitis, unspecified   . Acute upper respiratory infections of unspecified site   .  Adenocarcinoma, lung ([place])    upper left lobe  . Anemia in neoplastic disease   . Anemia, unspecified   . Anginal pain ([place])   . Anxiety   . Anxiety state, unspecified   . Arthritis    "all over my body"  . Asthma   . Asymptomatic varicose veins   . Atherosclerosis of native arteries of the extremities, unspecified   . Atrial fibrillation ([place])   . Bronchial pneumonia    history  . 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 Chemotherapy adverse reaction 09/15/11   "makes me light headed and pass out; this is 3rd blood transfusion since going thru it"  . CHF (congestive heart failure) ([place])   . Chronic airway obstruction, not elsewhere classified   . Chronic fatigue 08/19/2015  . Chronic sinusitis   . Complication of anesthesia    slow to wake up  . COPD (chronic obstructive pulmonary disease) ([place])   . Coronary artery disease    Dr. Burt Knack had stress test done  . DDD (degenerative disc disease), lumbar   . Depressive disorder, not elsewhere classified   . Diseases of lips   . Disorder of bone and cartilage, unspecified   . Dizziness and giddiness   . DVT (deep venous thrombosis) ([place])   . Dysrhythmia    irregular  . Effects of radiation, unspecified   . Emphysema   . Encounter for antineoplastic immunotherapy 08/19/2015  . Fibromyalgia   . Functional urinary incontinence   . GERD (gastroesophageal reflux disease)   . Hard of hearing, right   . Heart murmur   . Herpes  zoster without mention of complication   . Hip pain, chronic 09/17/2014  . History of blood clots    "2 in my aorta"  . History of blood transfusion   . History of bronchitis   . Hx of radiation therapy 04/28/11 -06/08/11   right upper lobe-lung  . Hyperlipidemia   . Hyperpotassemia   . Hypertension   . Hypopotassemia   . Kidney infection    "related to chemo"  . Lumbago   . Malignant neoplasm of bronchus and lung, unspecified site   . Mental disorder   . Muscle weakness (generalized)   . Muscle  weakness (generalized)   . Non-small cell lung cancer ([place]) 03/31/2011   Adenocarcinoma Rt upper lobe mass  . Osteoarthrosis, unspecified whether generalized or localized, unspecified site   . Osteoporosis   . Other abnormal blood chemistry   . Other and unspecified hyperlipidemia   . PAD (peripheral artery disease) ([place])   . Pain in thoracic spine   . Peripheral vascular disease ([place])   . 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 Primary cancer of right upper lobe of lung ([place]) 02/08/2011   Biopsy Rt upper lobe mass 03/31/11>>adenocarcinoma DIAGNOSIS: Stage IIB/IIIA non-small cell lung cancer consistent with adenocarcinoma diagnosed in December 2012.   PRIOR THERAPY:  1) Status post radiation therapy to the right upper lobe lung mass completed on 06/04/2011 under the care of Dr. Pablo Ledger.  2) Systemic chemotherapy with carboplatin for AUC of 5 and Alimta 500 mg/M2 giving every 3   . Reflux   . Reflux esophagitis   . Respiratory failure with hypoxia 01/02/2010  . Restless legs syndrome (RLS)   . Scoliosis   . Screening for thyroid disorder   . Shortness of breath    "anytime really"  . Sinus headache    "all the time"  . Spasm of muscle   . Syncope and collapse   . Type II or unspecified type diabetes mellitus with neurological manifestations, uncontrolled(250.62)   . Type II or unspecified type diabetes mellitus without mention of complication, not stated as uncontrolled   . Type II or unspecified type diabetes mellitus without mention of complication, not stated as uncontrolled   . Unspecified constipation   . Unspecified essential hypertension   . Unspecified hearing loss   . Unspecified hereditary and idiopathic peripheral neuropathy   . Unspecified vitamin D deficiency   . Urinary tract infection, site not specified     ALLERGIES:  is allergic to iohexol; other; anoro ellipta [umeclidinium-vilanterol]; codeine; and protonix [pantoprazole sodium].  MEDICATIONS:  Current  Outpatient Prescriptions  Medication Sig Dispense Refill  . ALPRAZolam (XANAX) 1 MG tablet TAKE 1 TABLET BY MOUTH THREE TIMES A DAY AS NEEDED FOR ANXIETY 90 tablet 0  . aspirin 81 MG tablet Take 81 mg by mouth daily.    . cetirizine-pseudoephedrine (ZYRTEC-D) 5-120 MG tablet One twice daily to help dizziness 60 tablet 1  . citalopram (CELEXA) 20 MG tablet TAKE 1 TABLET BY MOUTH EVERY DAY TO HELP MOOD 90 tablet 1  . cyproheptadine (PERIACTIN) 4 MG tablet Take 1 tablet (4 mg total) by mouth 2 (two) times daily. For itching 30 tablet 0  . diltiazem (CARDIZEM CD) 240 MG 24 hr capsule TAKE 1 CAPSULE BY MOUTH EVERY DAY FOR BLOOD PRESSURE. 30 capsule 1  . fluconazole (DIFLUCAN) 100 MG tablet Take 1 tablet (100 mg total) by mouth daily. 4 tablet 0  . fluticasone (FLONASE) 50 MCG/ACT nasal spray INSTILL 2 SPRAYS INTO BOTH  NOSTRILS  TWICE DAILY *NEEDS APPOINTMENT* 16 g 5  . glucose blood (FREESTYLE LITE) test strip Use to test blood sugar three times daily. Dx: E11.9, E11.49 100 each 12  . HEMATINIC/FOLIC ACID 505-3 MG TABS TAKE 1 TABLET BY MOUTH EVERY DAY 30 each 2  . HYDROcodone-acetaminophen (NORCO) 10-325 MG tablet Take One tablet by mouth every 6 hours if needed for pain 120 tablet 0  . Lancets (FREESTYLE) lancets Use to test blood sugar three times daily. Dx: E11.9, E11.49 100 each 12  . loratadine (CLARITIN) 10 MG tablet TAKE 1 TABLET BY MOUTH EVERY DAY 30 tablet 3  . metFORMIN (GLUCOPHAGE) 500 MG tablet Take by mouth. Take one tablet at night for blood sugar    . Multiple Vitamins-Minerals (CENTRUM SILVER PO) Take 1 tablet by mouth daily. Patient unsure of dose    . NON FORMULARY Place 3 L into the nose daily. Oxygen    . potassium chloride (K-DUR,KLOR-CON) 10 MEQ tablet TAKE 1 TABLET BY MOUTH EVERY DAY 30 tablet 1  . Tiotropium Bromide-Olodaterol (STIOLTO RESPIMAT) 2.5-2.5 MCG/ACT AERS Inhale 2 puffs into the lungs daily. 1 Inhaler 5  . traMADol (ULTRAM) 50 MG tablet TAKE 1 TABLET BY MOUTH THREE  TIMES A DAY AS NEEDED FOR PAIN 90 tablet 4  . triamterene-hydrochlorothiazide (MAXZIDE-25) 37.5-25 MG tablet TAKE 1 TABLET BY MOUTH EVERY DAY TO CONTROL BLOOD PRESSURE 90 tablet 2   No current facility-administered medications for this visit.    Facility-Administered Medications Ordered in Other Visits  Medication Dose Route Frequency Provider Last Rate Last Dose  . hyaluronate sodium (RADIAPLEXRX) gel   Topical Once Thea Silversmith, MD      . topical emolient (BIAFINE) emulsion   Topical PRN Thea Silversmith, MD        SURGICAL HISTORY:  Past Surgical History:  Procedure Laterality Date  . BRONCHOSCOPY  02/2011  . CARDIAC CATHETERIZATION    . CATARACT EXTRACTION  03/12/2013   Left Eye   . DILATION AND CURETTAGE OF UTERUS  1980's  . EYE SURGERY Left 2014   Cataract  . FETAL BLOOD TRANSFUSION  09/16/11  . JOINT REPLACEMENT  04/13/10   Left Hip  . LUNG LOBECTOMY  1986   left  . SEPTOPLASTY    . TOTAL HIP ARTHROPLASTY  01/2010   left  . TUBAL LIGATION  1980's    REVIEW OF SYSTEMS:  A comprehensive review of systems was negative except for: Constitutional: positive for fatigue Respiratory: positive for dyspnea on exertion Neurological: positive for dizziness   PHYSICAL EXAMINATION: General appearance: alert, cooperative, fatigued and no distress Head: Normocephalic, without obvious abnormality, atraumatic Neck: no adenopathy, no JVD, supple, symmetrical, trachea midline and thyroid not enlarged, symmetric, no tenderness/mass/nodules Lymph nodes: Cervical, supraclavicular, and axillary nodes normal. Resp: clear to auscultation bilaterally Back: symmetric, no curvature. ROM normal. No CVA tenderness. Cardio: regular rate and rhythm, S1, S2 normal, no murmur, click, rub or gallop GI: soft, non-tender; bowel sounds normal; no masses,  no organomegaly Extremities: extremities normal, atraumatic, no cyanosis or edema Neurologic: Alert and oriented X 3, normal strength and tone. Normal  symmetric reflexes. Normal coordination and gait  ECOG PERFORMANCE STATUS: 1 - Symptomatic but completely ambulatory  Blood pressure (!) 153/54, pulse (!) 110, temperature 99.2 F (37.3 C), temperature source Oral, resp. rate 19, height 5' 6"  (1.676 m), weight 169 lb 8 oz (76.9 kg), SpO2 100 %.  LABORATORY DATA: Lab Results  Component Value Date   WBC 9.8 02/04/2016  HGB 13.0 02/04/2016   HCT 38.5 02/04/2016   MCV 90.4 02/04/2016   PLT 320 02/04/2016      Chemistry      Component Value Date/Time   NA 139 02/04/2016 1304   K 3.6 02/04/2016 1304   CL 97 08/06/2015 1036   CL 99 07/21/2012 1125   CO2 28 02/04/2016 1304   BUN 15.3 02/04/2016 1304   CREATININE 1.2 (H) 02/04/2016 1304      Component Value Date/Time   CALCIUM 10.5 (H) 02/04/2016 1304   ALKPHOS 149 02/04/2016 1304   AST 21 02/04/2016 1304   ALT 16 02/04/2016 1304   BILITOT 0.43 02/04/2016 1304       RADIOGRAPHIC STUDIES: Ct Chest Wo Contrast  Result Date: 01/05/2016 CLINICAL DATA:  Restaging right upper lobe lung cancer. History of partial left lung resection. EXAM: CT CHEST WITHOUT CONTRAST TECHNIQUE: Multidetector CT imaging of the chest was performed following the standard protocol without IV contrast. COMPARISON:  Chest CT 10/27/2015 and 08/12/2015. FINDINGS: Cardiovascular: There is atherosclerosis of the aorta, great vessels and coronary arteries. No acute vascular findings are seen on noncontrast imaging. The heart size is normal. There is no pericardial effusion. Mediastinum/Nodes: There are no enlarged mediastinal, hilar or axillary lymph nodes.The right hilum is partly obscured by perihilar scarring/fibrosis, but appears unchanged. The thyroid gland and trachea demonstrate no significant findings. There is a stable small hiatal hernia. Lungs/Pleura: Small right pleural effusion has nearly completely resolved. There are stable postsurgical changes status post left upper lobe resection. There is stable  chronic central bronchial narrowing and distortion bilaterally. Moderate emphysematous changes are present. There is stable chronic volume loss and scarring posteromedially in the right upper lobe. The dominant mixed ground-glass and solid density in the right middle lobe is similar, measuring approximately 8.5 x 5.5 cm on image 69. Surrounding peripheral ground-glass densities in the right middle lobe are also stable. There are stable ground-glass densities in the left lower lobe on images 34 and 70. Upper abdomen: The visualized upper abdomen appears stable. The hepatic density is decreased consistent with steatosis. No evidence of adrenal mass. Calcified gallstones are present. Musculoskeletal/Chest wall: No acute osseous findings or evidence of metastatic disease. There are stable superior endplate compression deformities at T4 and T5 status post spinal augmentation at T4. 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 There are old rib fractures or thoracotomy defects on the right. IMPRESSION: 1. No significant change in the appearance of the lungs. Emphysema, postsurgical changes and scattered predominantly ground-glass densities are stable. 2. No evidence of progressive metastatic disease. 3. Diffuse atherosclerosis. Electronically Signed   By: Richardean Sale M.D.   On: 01/05/2016 17:20   Mr Jeri Cos HW Contrast  Result Date: 01/23/2016 CLINICAL DATA:  70 y/o F; lung cancer with reports of headaches, dizziness, and lightheadedness. EXAM: MRI HEAD WITHOUT AND WITH CONTRAST TECHNIQUE: Multiplanar, multiecho pulse sequences of the brain and surrounding structures were obtained without and with intravenous contrast. CONTRAST:  69m MULTIHANCE GADOBENATE DIMEGLUMINE 529 MG/ML IV SOLN COMPARISON:  04/23/2011 MRI brain. FINDINGS: Brain: No diffusion signal abnormality. Mild parenchymal volume loss and chronic microvascular ischemic changes. No abnormal susceptibility hypointensity. No  abnormal enhancement of the brain parenchyma. No mass effect. Normal ventricle size. No extra-axial collection. Vascular: Normal flow voids. Skull and upper cervical spine: Normal marrow signal. Hyperostosis frontalis interna. Sinuses/Orbits: Anterior ethmoid and left maxillary sinus mucosal thickening. Minimal opacification of right greater than left mastoid tips. Bilateral intra-ocular lens replacement.  Other: Incidental small fossa navicularis magna. IMPRESSION: 1. No metastatic disease of the brain identified. 2. Mild chronic microvascular ischemic changes and parenchymal volume loss. Electronically Signed   By: Kristine Garbe M.D.   On: 01/23/2016 22:22   Ct Maxillofacial Wo Contrast  Result Date: 01/05/2016 CLINICAL DATA:  Sinusitis of unspecified chronicity. EXAM: CT MAXILLOFACIAL WITHOUT CONTRAST TECHNIQUE: Multidetector CT imaging of the maxillofacial structures was performed. Multiplanar CT image reconstructions were also generated. A small metallic BB was placed on the right temple in order to reliably differentiate right from left. COMPARISON:  02/02/2011 FINDINGS: Osseous: Remote bilateral nasal arch fractures. No acute fracture or erosive process. Incidental torus palatini Orbits: Bilateral cataract resection. No acute finding or mass. Symmetric thinned appearance of the lateral rectus. Sinuses: Left maxillary antrostomy which is widely patent. Right maxillary outflow was also patent. Clear frontal ethmoidal and sphenoid ethmoidal recesses. Anterior septal perforation which could be posttraumatic or postsurgical given the above. Anterior paradoxical rotation of the middle turbinates without nasal cavity stenosis. No fluid level or mucosal thickening. Soft tissues: Negative Limited intracranial: Senescent changes including volume loss and atherosclerotic calcification. IMPRESSION: 1. No active sinusitis or sinus obstruction. 2. Patent left maxillary antrostomy. 3. Chronic anterior nasal  septum perforation, potentially posttraumatic given remote nasal arch fractures. Electronically Signed   By: Monte Fantasia M.D.   On: 01/05/2016 17:14   ASSESSMENT AND PLAN: this is a very pleasant 70 years old white female with history of stage IIB/IIIA non-small cell lung cancer status post concurrent chemoradiation followed by consolidation chemotherapy with carboplatin and Alimta and has been observation since April 2013. She has no evidence for disease progression on his recent scan except for a continuous enlargement of the right upper lobe opacity. The molecular study for EGFR mutation was reported to be negative. The patient was started on treatment with systemic chemotherapy with carboplatin and Alimta status post 2 cycles but her treatment has been on hold secondary to increasing fatigue and weakness as well as pancytopenia. The restaging scan of the chest performed on 04/07/2016 showed mild improvement of her disease. I discussed the scan results with the patient and her daughter. I recommended for her to continue on observation for now with repeat CT scan of the chest in 3 months for restaging of her disease. The CT scan of the chest performed on 08/12/2015 showed evidence for disease progression in the right lung. The patient is currently on treatment with immunotherapy with Nivolumab status post 10 cycles and tolerating her treatment well.  I recommended for her to continue her treatment with Nivolumab with cycle #11 today as a schedule. The patient would come back for follow-up visit in 2 weeks for reevaluation before starting cycle #12 Her dizzy spells are most likely orthostatic with a changing position. I recommended for her to increase her by mouth intake and to monitor her blood pressure closely. She was advised to call immediately if she has any concerning symptoms in the interval. The patient voices understanding of current disease status and treatment options and is in agreement  with the current care plan.  All questions were answered. The patient knows to call the clinic with any problems, questions or concerns. We can certainly see the patient much sooner if necessary.  Disclaimer: This note was dictated with voice recognition software. Similar sounding words can inadvertently be transcribed and may not be corrected upon review.

## 2016-02-04 NOTE — Patient Instructions (Signed)
[facility] Discharge Instructions for Patients Receiving Chemotherapy  Today you received the following chemotherapy agents: Optivo  To help prevent nausea and vomiting after your treatment, we encourage you to take your nausea medication as directed.  If you develop nausea and vomiting that is not controlled by your nausea medication, call the clinic.   BELOW ARE SYMPTOMS THAT SHOULD BE REPORTED IMMEDIATELY:  *FEVER GREATER THAN 100.5 F  *CHILLS WITH OR WITHOUT FEVER  NAUSEA AND VOMITING THAT IS NOT CONTROLLED WITH YOUR NAUSEA MEDICATION  *UNUSUAL SHORTNESS OF BREATH  *UNUSUAL BRUISING OR BLEEDING  TENDERNESS IN MOUTH AND THROAT WITH OR WITHOUT PRESENCE OF ULCERS  *URINARY PROBLEMS  *BOWEL PROBLEMS  UNUSUAL RASH Items with * indicate a potential emergency and should be followed up as soon as possible.  Feel free to call the clinic you have any questions or concerns. The clinic phone number is [phone].  Please show the CHEMO ALERT CARD at check-in to the Emergency Department and triage nurse.   

## 2016-02-04 NOTE — Telephone Encounter (Signed)
GAVE PATIENT AVS REPORT AND APPOINTMENTS FOR November AND December

## 2016-02-09 ENCOUNTER — Ambulatory Visit: Payer: Medicare Other | Admitting: Emergency Medicine

## 2016-02-09 ENCOUNTER — Encounter: Payer: Self-pay | Admitting: Emergency Medicine

## 2016-02-09 ENCOUNTER — Ambulatory Visit (INDEPENDENT_AMBULATORY_CARE_PROVIDER_SITE_OTHER): Payer: Medicare Other | Admitting: Emergency Medicine

## 2016-02-09 DIAGNOSIS — J449 Chronic obstructive pulmonary disease, unspecified: Secondary | ICD-10-CM | POA: Diagnosis not present

## 2016-02-09 DIAGNOSIS — C3411 Malignant neoplasm of upper lobe, right bronchus or lung: Secondary | ICD-10-CM

## 2016-02-09 DIAGNOSIS — I779 Disorder of arteries and arterioles, unspecified: Secondary | ICD-10-CM | POA: Diagnosis not present

## 2016-02-09 NOTE — Progress Notes (Signed)
Subjective:    Patient ID: [name], female    DOB: 02-14-1946, 70 y.o.   MRN: [id number] HPI History of Present Illness:  [name] is a 70 y.o. 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 Has received XRT and chemo with Drs Pablo Ledger and Julien Nordmann.  She tolerated XRT completely. She has completed 3 cycles chemo.  Currently managed on combivent qid, started O2.   ROV 08/01/14 -- follow-up visit for COPD and adenocarcinoma of the long in the right upper lobe. She underwent chemotherapy and radiation therapy and is currently under observation.  Her last CT scan was 07/29/14. There was some possible increase in groundglass the right upper lung zone. She c/o being more weak, more DOE. She is more fatigued. Her o2 is set on 2L/min, occasionally to 3L/min.   ROV 04/24/15 -- follow up visit for COPD and RUL adenoCA. Her last chemotherapy was in November '16, she hasn't had any since due to side effects and fatigue. Most recent CT was 12/27 which I have personally reviewed. Shows interval decrease in size of her RML GG nodule. She has felt worse for the last 3 weeks. She has had cough, rhinorrhea, congestion, subjective fever, dizziness, upset stomach and felt bad for 3 weeks. She was seen in the ED 1/3, treated with azithro. No improvement. She is taking combivent prn, about 2x a day. She started mucinex yesterday.   ROV 10/07/15 -- patient with a history of COPD, right upper lobe adenocarcinoma stage IIB/IIIa she has some enlargement of her right upper lobe opacity. She has been on immunotherapy with Nivolumab, last dose was 2 weeks ago. She is having some itching, not sure whether associated with the therapy. She will likely have another CT chest in the next 2 months. She is having continued chest tightness. More SOB with exertion. She is using combivent prn, usually tid. She is more SOB at night, hears wheezing.   ROV 11/04/15 -- this is a follow-up visit for history of  stage IIb/IIIa adenocarcinoma of the right upper lobe, COPD. She has been managed most recently on Nivolumab. Her most recent CT scan of the chest was on 10/27/15 I reviewed this today. Overall there is stability in her right-sided groundglass opacity. There is a new adjacent 4 mm satellite nodule in this region. Also there is some left apical groundglass that is new compared with her prior study. These are to be followed with serial scanning on current therapy. Most recently we tried starting Anoro > she took this for about 10 days, had significant cough and hoarseness. She's been tried on Spiriva before as well but was unable to tolerate due to similar sx.  She is using ventolin prn. She did tolerate combivent in the past.   ROV 02/09/16 -- patient has a history of COPD as well as stage IIIA adenocarcinoma the right upper lobe. She has been managed on Nivolumab. She underwent an MRI brain 01/23/16 that did not show any evidence of metastatic disease.  We tried changing her to [facility], took it for 2 weeks but had more chest tightness, more dyspnea. Went back to [place], currently using 2 puffs bid. She is having some exertional SOB, difficulty with any activity, showering. Wears O2 at 4L/min usually. She has a lot of cough, produces sputum. She is on loratadine alternated w zyrtec-d, and fluticasone qd.      Objective:   Physical Exam Vitals:   02/09/16 1602 02/09/16 1603  BP:  134/60  Pulse:  96  SpO2:  98%  Weight: 172 lb (78 kg)   Height: '5\' 6"'$  (1.676 m)    Gen: Pleasant, elderly woman, in no distress,  normal affect  ENT: No lesions,  mouth clear,  oropharynx clear, no postnasal drip  Neck: No JVD, no TMG, no carotid bruits  Lungs: No use of accessory muscles, distant, no wheeze  Cardiovascular: RRR, heart sounds normal, no murmur or gallops, no peripheral edema  Musculoskeletal: No deformities, no cyanosis or clubbing.   Neuro: alert, non focal  Skin: Warm, no lesions or  rashes   CBC Latest Ref Rng & Units 02/04/2016 01/22/2016 01/07/2016  WBC 3.9 - 10.3 10e3/uL 9.8 8.9 9.0  Hemoglobin 11.6 - 15.9 g/dL 13.0 11.7 12.0  Hematocrit 34.8 - 46.6 % 38.5 34.3(L) 36.4  Platelets 145 - 400 10e3/uL 320 301 324   BMET    Component Value Date/Time   NA 139 02/04/2016 1304   K 3.6 02/04/2016 1304   CL 97 08/06/2015 1036   CL 99 07/21/2012 1125   CO2 28 02/04/2016 1304   GLUCOSE 121 02/04/2016 1304   GLUCOSE 184 (H) 07/21/2012 1125   BUN 15.3 02/04/2016 1304   CREATININE 1.2 (H) 02/04/2016 1304   CALCIUM 10.5 (H) 02/04/2016 1304   GFRNONAA 42 (L) 08/06/2015 1036   GFRAA 48 (L) 08/06/2015 1036    CT chest 10/27/15 COMPARISON:  08/12/2015  FINDINGS: Cardiovascular: Aortic and branch vessel atherosclerosis. Normal heart size with minimal anterior pericardial fluid or thickening, similar. Multivessel coronary artery atherosclerosis.  Mediastinum/Nodes: No mediastinal or definite hilar adenopathy, given limitations of unenhanced CT. Esophageal fluid level.  Lungs/Pleura: Small right-sided pleural effusion is similar. Right-sided pleural based calcification is adjacent to healing right rib fractures and likely dystrophic. No left-sided pleural fluid.  Left upper lobectomy. Narrowing of left upper lobe bronchi secondary to volume loss, similar.  Moderate centrilobular emphysema. Right upper or middle lobe ground-glass opacity with septal thickening. This measures on the order of 8.4 x 5.9 cm today versus 8.8 x 5.2 cm previously. Slightly more well-defined, with greater ground-glass density today. 2.6 cm on sagittal image 37 in craniocaudal dimension. Similar.  A separate more inferior and lateral right upper lobe pulmonary nodule measures 4 mm on image 75/series 5 and is new. Also sagittal image 31.  Posterior right upper lobe consolidation is likely treatment related and similar. Concurrent volume loss.  Left apical ground-glass nodule  measures 6 mm on image 28/series 5 and is either new or more apparent today.  A left lower lobe sub solid pulmonary nodule is unchanged at 7 mm on image 62/series 5  3 mm right lower lobe pulmonary nodule is similar on image 95/series 5.  medial right middle lobe area of probable mucous plugging is similar on image 78/series 5.  Upper Abdomen: Mild hepatic steatosis. Gallstones on the order of 6 mm. Normal imaged portions of the spleen, stomach, pancreas, kidneys. Low-density bilateral adrenal nodules are similar and likely due to underlying adenomas. Abdominal aortic atherosclerosis.  Musculoskeletal: Postsurgical or posttraumatic right-sided rib defects. Prior vertebral augmentation at T4. T5 mild to moderate compression deformity is similar.  IMPRESSION: 1. The dominant right-sided ground-glass opacity measures similar in size but is slightly more well-defined today, with increased density within. 2. An adjacent satellite 4 mm nodule is new and suspicious for progressive disease. Similarly, a left apical ground-glass nodule is new or increased and suspicious for progressive disease. 3. No thoracic adenopathy. 4. Other pulmonary lesions  are similar. 5. Similar volume loss within the posterior right upper lobe with prior left upper lobectomy. 6. Similar small right pleural effusion. 7. Hepatic steatosis. 8.  Coronary artery atherosclerosis. Aortic atherosclerosis. 9. Cholelithiasis. 10. Esophageal air fluid level suggests dysmotility or gastroesophageal reflux.      Assessment & Plan:  Primary cancer of right upper lobe of lung ([place]) On Nivolumab maintenance, following w Dr Julien Nordmann.   COPD (chronic obstructive pulmonary disease) (HCC) We will increase combivent to qid O2 as ordered.   Baltazar Apo, MD, PhD 02/09/2016, 4:25 PM [facility] [phone] or if no answer [phone]

## 2016-02-09 NOTE — Assessment & Plan Note (Signed)
We will increase combivent to qid O2 as ordered.

## 2016-02-09 NOTE — Patient Instructions (Addendum)
Please increase your combivent to 2 puffs 4x a day.  Continue your oxygen at 3-4L/min at all times.  Flu shot up to date.  Follow with Dr Julien Nordmann as planned.  Follow with Dr Lamonte Sakai in 4 months or sooner if you have any problems

## 2016-02-09 NOTE — Assessment & Plan Note (Signed)
On Nivolumab maintenance, following w Dr Julien Nordmann.

## 2016-02-10 ENCOUNTER — Ambulatory Visit (INDEPENDENT_AMBULATORY_CARE_PROVIDER_SITE_OTHER): Payer: Medicare Other | Admitting: Internal Medicine

## 2016-02-10 ENCOUNTER — Encounter: Payer: Self-pay | Admitting: Internal Medicine

## 2016-02-10 ENCOUNTER — Ambulatory Visit: Payer: Medicare Other

## 2016-02-10 VITALS — BP 158/68 | HR 80 | Temp 98.4°F | Ht 66.0 in | Wt 172.0 lb

## 2016-02-10 DIAGNOSIS — R1310 Dysphagia, unspecified: Secondary | ICD-10-CM

## 2016-02-10 DIAGNOSIS — I779 Disorder of arteries and arterioles, unspecified: Secondary | ICD-10-CM | POA: Diagnosis not present

## 2016-02-10 DIAGNOSIS — D649 Anemia, unspecified: Secondary | ICD-10-CM | POA: Diagnosis not present

## 2016-02-10 DIAGNOSIS — J449 Chronic obstructive pulmonary disease, unspecified: Secondary | ICD-10-CM | POA: Diagnosis not present

## 2016-02-10 DIAGNOSIS — C3411 Malignant neoplasm of upper lobe, right bronchus or lung: Secondary | ICD-10-CM | POA: Diagnosis not present

## 2016-02-10 DIAGNOSIS — I10 Essential (primary) hypertension: Secondary | ICD-10-CM | POA: Diagnosis not present

## 2016-02-10 DIAGNOSIS — R1319 Other dysphagia: Secondary | ICD-10-CM

## 2016-02-10 DIAGNOSIS — E1142 Type 2 diabetes mellitus with diabetic polyneuropathy: Secondary | ICD-10-CM

## 2016-02-10 DIAGNOSIS — H8110 Benign paroxysmal vertigo, unspecified ear: Secondary | ICD-10-CM

## 2016-02-10 NOTE — Progress Notes (Signed)
Facility  [place]    Place of Service:   OFFICE    Allergies  Allergen Reactions  . Iohexol Shortness Of Breath and Other (See Comments)    "burns me up inside"    Pt does fine with 13 hour prep  01/17/12  . Other     CAN'T STAND THE SMELL OF PURE BLEACH; "HAVE TO BACK UP AND POUR AND DON'T BREATH IT TIL i GET CAP BACK ON; DILUTE IT TO SPRAY"  . Anoro Ellipta [Umeclidinium-Vilanterol] Swelling    Swelling of lips and mouth  . Codeine Itching and Nausea And Vomiting    REACTION: GI upset  . Protonix [Pantoprazole Sodium] Diarrhea    Chief Complaint  Patient presents with  . Medical Management of Chronic Issues    3 month medication management blood sugar, anemia, CHF, anxiety, review labs. Here with son Octavia Bruckner    HPI:  Last seen by me 01/20/16 with BPPV. Recommended Zyrtec D.  Oral candida treated by oncology 01/22/16. This seems to have cleared up.  Saw [name] on 02/04/16. Oncologic treatment reviewed. He thought her dizziness was due to orthostatic changes.  Seen by [name] on 02/09/16. He increased Combivent to qid. This helped her breathing.  Primary cancer of right upper lobe of lung ([place]) - continues with chemotherapy  Anemia, unspecified type - resolved  Benign paroxysmal positional vertigo, unspecified laterality - some improvement in symptoms. Has not used the Zyrtec d regularly. Says when she moves her head that she gets sudden vertigo.  Chronic obstructive pulmonary disease, unspecified COPD type ([place]) 0 continue to see [name]  Esophageal dysphagia - occassions of getting choked and coughing  DM type 2 with diabetic peripheral neuropathy ([place]) - controlled on recent lab.      Medications: Patient's Medications  New Prescriptions   No medications on file  Previous Medications   ALPRAZOLAM (XANAX) 1 MG TABLET    TAKE 1 TABLET BY MOUTH THREE TIMES A DAY AS NEEDED FOR ANXIETY   ASPIRIN 81 MG TABLET    Take 81 mg by mouth daily.   CETIRIZINE-PSEUDOEPHEDRINE (ZYRTEC-D) 5-120 MG TABLET    One twice daily to help dizziness   CITALOPRAM (CELEXA) 20 MG TABLET    TAKE 1 TABLET BY MOUTH EVERY DAY TO HELP MOOD   DILTIAZEM (CARDIZEM CD) 240 MG 24 HR CAPSULE    TAKE 1 CAPSULE BY MOUTH EVERY DAY FOR BLOOD PRESSURE.   FLUCONAZOLE (DIFLUCAN) 100 MG TABLET    Take 1 tablet (100 mg total) by mouth daily.   FLUTICASONE (FLONASE) 50 MCG/ACT NASAL SPRAY    INSTILL 2 SPRAYS INTO BOTH NOSTRILS  TWICE DAILY *NEEDS APPOINTMENT*   GLUCOSE BLOOD (FREESTYLE LITE) TEST STRIP    Use to test blood sugar three times daily. Dx: E11.9, A54.09   HEMATINIC/FOLIC ACID 811-9 MG TABS    TAKE 1 TABLET BY MOUTH EVERY DAY   HYDROCODONE-ACETAMINOPHEN (NORCO) 10-325 MG TABLET    Take One tablet by mouth every 6 hours if needed for pain   IPRATROPIUM-ALBUTEROL (COMBIVENT RESPIMAT) 20-100 MCG/ACT AERS RESPIMAT    Inhale into the lungs. 2 puffs four times a day   LANCETS (FREESTYLE) LANCETS    Use to test blood sugar three times daily. Dx: E11.9, E11.49   LORATADINE (CLARITIN) 10 MG TABLET    TAKE 1 TABLET BY MOUTH EVERY DAY   METFORMIN (GLUCOPHAGE) 500 MG TABLET    Take by mouth. Take one tablet at night for blood  sugar   MULTIPLE VITAMINS-MINERALS (CENTRUM SILVER PO)    Take 1 tablet by mouth daily. Patient unsure of dose   NON FORMULARY    Place 3 L into the nose daily. Oxygen   POTASSIUM CHLORIDE (K-DUR,KLOR-CON) 10 MEQ TABLET    TAKE 1 TABLET BY MOUTH EVERY DAY   TRAMADOL (ULTRAM) 50 MG TABLET    TAKE 1 TABLET BY MOUTH THREE TIMES A DAY AS NEEDED FOR PAIN   TRIAMTERENE-HYDROCHLOROTHIAZIDE (MAXZIDE-25) 37.5-25 MG TABLET    TAKE 1 TABLET BY MOUTH EVERY DAY TO CONTROL BLOOD PRESSURE  Modified Medications   No medications on file  Discontinued Medications   CYPROHEPTADINE (PERIACTIN) 4 MG TABLET    Take 1 tablet (4 mg total) by mouth 2 (two) times daily. For itching    Review of Systems  Constitutional: Positive for activity change. Negative for chills and  fever.  HENT: Positive for postnasal drip, sinus pressure and trouble swallowing. Negative for congestion, ear pain and sore throat.        Popping noise in ears  Eyes: Negative.   Respiratory: Positive for cough and shortness of breath. Negative for choking, chest tightness, wheezing and stridor.        Lung cancer. O2 dependent. Portable O2.  Cardiovascular: Negative for chest pain and palpitations.  Gastrointestinal: Positive for constipation. Negative for abdominal distention, abdominal pain, diarrhea and nausea.       Reflux frequently.  Endocrine:       DM on metformin  Musculoskeletal: Positive for arthralgias, back pain, gait problem, myalgias and neck pain.       Left hip pain  Skin:       Generalized itching and occasional red bumps.  Neurological: Positive for dizziness (true vertigo), weakness (generalized), light-headedness and headaches. Negative for tremors and syncope.  Hematological:       Hx of anemia; currently normal hgb.  Psychiatric/Behavioral: Positive for dysphoric mood and sleep disturbance. Negative for hallucinations. The patient is not nervous/anxious and is not hyperactive.     Vitals:   02/10/16 1424  BP: (!) 158/68  Pulse: 80  Temp: 98.4 F (36.9 C)  TempSrc: Oral  SpO2: 94%  Weight: 172 lb (78 kg)  Height: 5' 6"  (1.676 m)   Body mass index is 27.76 kg/m. Wt Readings from Last 3 Encounters:  02/10/16 172 lb (78 kg)  02/09/16 172 lb (78 kg)  02/04/16 169 lb 8 oz (76.9 kg)      Physical Exam  Constitutional: She is oriented to person, place, and time.  overweight  HENT:  Nose: Nose normal.  Mouth/Throat: Oropharynx is clear and moist. No oropharyngeal exudate.  Loss of hearing. Hearing aid left ear.  Eyes: Conjunctivae and EOM are normal. Pupils are equal, round, and reactive to light.  Neck: No JVD present. No tracheal deviation present. No thyromegaly present.  Cardiovascular: Normal rate, regular rhythm and normal heart sounds.  Exam  reveals no gallop and no friction rub.   No murmur heard. Pulmonary/Chest: No respiratory distress. She has no wheezes. She has no rales. She exhibits no tenderness.  Abdominal: She exhibits no distension and no mass. There is no tenderness.  Musculoskeletal: Normal range of motion. She exhibits edema and tenderness.  Lymphadenopathy:    She has no cervical adenopathy.  Neurological: She is oriented to person, place, and time. No cranial nerve deficit. Coordination normal.  Vertigo with movement of the head.  Skin: No rash noted. No erythema. No pallor.  Psychiatric: Her behavior  is normal. Judgment and thought content normal.    Labs reviewed: Lab Summary Latest Ref Rng & Units 02/04/2016 01/22/2016 01/07/2016  Hemoglobin 11.6 - 15.9 g/dL 13.0 11.7 12.0  Hematocrit 34.8 - 46.6 % 38.5 34.3(L) 36.4  White count 3.9 - 10.3 10e3/uL 9.8 8.9 9.0  Platelet count 145 - 400 10e3/uL 320 301 324  Sodium 136 - 145 mEq/L 139 139 141  Potassium 3.5 - 5.1 mEq/L 3.6 3.4(L) 3.3(L)  Calcium 8.4 - 10.4 mg/dL 10.5(H) 10.6(H) 10.2  Phosphorus - (None) (None) (None)  Creatinine 0.6 - 1.1 mg/dL 1.2(H) 1.0 1.1  AST 5 - 34 U/L 21 22 21   Alk Phos 40 - 150 U/L 149 135 124  Bilirubin 0.20 - 1.20 mg/dL 0.43 0.32 0.44  Glucose 70 - 140 mg/dl 121 96 101  Cholesterol - (None) (None) (None)  HDL cholesterol - (None) (None) (None)  Triglycerides - (None) (None) (None)  LDL Direct - (None) (None) (None)  LDL Calc - (None) (None) (None)  Total protein 6.4 - 8.3 g/dL 7.9 7.3 7.3  Albumin 3.5 - 5.0 g/dL 4.0 3.8 3.7  Some recent data might be hidden   Lab Results  Component Value Date   TSH 1.696 01/22/2016   TSH 3.041 12/24/2015   TSH 2.020 11/26/2015   Lab Results  Component Value Date   BUN 15.3 02/04/2016   BUN 17.4 01/22/2016   BUN 19.0 01/07/2016   Lab Results  Component Value Date   HGBA1C 5.5 08/06/2015   HGBA1C 5.7 (H) 12/18/2013   HGBA1C 5.1 09/15/2011    Assessment/Plan 1. Primary cancer  of right upper lobe of lung ([place]) Continue with [name]  2. Anemia, unspecified type resolved  3. Benign paroxysmal positional vertigo, unspecified laterality Use Zyrtec d regularly. Refer to PT for treatment if she desires  4. Chronic obstructive pulmonary disease, unspecified COPD type ([place]) continue with [name]  5. Esophageal dysphagia She does not want additional study at this time  6. DM type 2 with diabetic peripheral neuropathy (HCC) controlled  7. Essential hypertension Elevated SBP. No change in medication.

## 2016-02-16 ENCOUNTER — Telehealth: Payer: Self-pay | Admitting: *Deleted

## 2016-02-16 NOTE — Telephone Encounter (Signed)
Pt called about an appt at 0900 on 11/22. Pt advised this is too early and needs to be rescheduled to a later time. Discussed with pt to inform scheduling when she comes in on 11/8 and make these changes and MD may also enter additional orders or appts to be scheduled. Pt agreed no further concerns.

## 2016-02-18 ENCOUNTER — Ambulatory Visit (HOSPITAL_BASED_OUTPATIENT_CLINIC_OR_DEPARTMENT_OTHER): Payer: Medicare Other | Admitting: Nurse Practitioner

## 2016-02-18 ENCOUNTER — Other Ambulatory Visit: Payer: Self-pay

## 2016-02-18 ENCOUNTER — Ambulatory Visit (HOSPITAL_BASED_OUTPATIENT_CLINIC_OR_DEPARTMENT_OTHER): Payer: Medicare Other

## 2016-02-18 ENCOUNTER — Other Ambulatory Visit (HOSPITAL_BASED_OUTPATIENT_CLINIC_OR_DEPARTMENT_OTHER): Payer: Medicare Other

## 2016-02-18 ENCOUNTER — Telehealth: Payer: Self-pay | Admitting: Internal Medicine

## 2016-02-18 VITALS — BP 136/63 | HR 97 | Temp 98.0°F | Resp 14 | Ht 66.0 in | Wt 168.0 lb

## 2016-02-18 DIAGNOSIS — C3411 Malignant neoplasm of upper lobe, right bronchus or lung: Secondary | ICD-10-CM

## 2016-02-18 DIAGNOSIS — R42 Dizziness and giddiness: Secondary | ICD-10-CM | POA: Diagnosis not present

## 2016-02-18 DIAGNOSIS — Z5112 Encounter for antineoplastic immunotherapy: Secondary | ICD-10-CM | POA: Diagnosis present

## 2016-02-18 DIAGNOSIS — R5383 Other fatigue: Secondary | ICD-10-CM

## 2016-02-18 DIAGNOSIS — Z79899 Other long term (current) drug therapy: Secondary | ICD-10-CM

## 2016-02-18 DIAGNOSIS — Z9981 Dependence on supplemental oxygen: Secondary | ICD-10-CM

## 2016-02-18 DIAGNOSIS — Z5111 Encounter for antineoplastic chemotherapy: Secondary | ICD-10-CM

## 2016-02-18 LAB — CBC WITH DIFFERENTIAL/PLATELET
BASO%: 0.5 % (ref 0.0–2.0)
Basophils Absolute: 0.1 10*3/uL (ref 0.0–0.1)
EOS%: 5.2 % (ref 0.0–7.0)
Eosinophils Absolute: 0.5 10*3/uL (ref 0.0–0.5)
HEMATOCRIT: 39.2 % (ref 34.8–46.6)
HEMOGLOBIN: 12.9 g/dL (ref 11.6–15.9)
LYMPH#: 2 10*3/uL (ref 0.9–3.3)
LYMPH%: 19.6 % (ref 14.0–49.7)
MCH: 30.1 pg (ref 25.1–34.0)
MCHC: 32.8 g/dL (ref 31.5–36.0)
MCV: 91.7 fL (ref 79.5–101.0)
MONO#: 0.9 10*3/uL (ref 0.1–0.9)
MONO%: 8.5 % (ref 0.0–14.0)
NEUT%: 66.2 % (ref 38.4–76.8)
NEUTROS ABS: 6.9 10*3/uL — AB (ref 1.5–6.5)
PLATELETS: 364 10*3/uL (ref 145–400)
RBC: 4.27 10*6/uL (ref 3.70–5.45)
RDW: 13.7 % (ref 11.2–14.5)
WBC: 10.4 10*3/uL — AB (ref 3.9–10.3)

## 2016-02-18 LAB — COMPREHENSIVE METABOLIC PANEL
ALT: 18 U/L (ref 0–55)
ANION GAP: 15 meq/L — AB (ref 3–11)
AST: 25 U/L (ref 5–34)
Albumin: 4 g/dL (ref 3.5–5.0)
Alkaline Phosphatase: 144 U/L (ref 40–150)
BILIRUBIN TOTAL: 0.56 mg/dL (ref 0.20–1.20)
BUN: 16.3 mg/dL (ref 7.0–26.0)
CALCIUM: 10.6 mg/dL — AB (ref 8.4–10.4)
CO2: 29 mEq/L (ref 22–29)
CREATININE: 1.2 mg/dL — AB (ref 0.6–1.1)
Chloride: 99 mEq/L (ref 98–109)
EGFR: 45 mL/min/{1.73_m2} — ABNORMAL LOW (ref >90)
Glucose: 119 mg/dl (ref 70–140)
Potassium: 3.3 mEq/L — ABNORMAL LOW (ref 3.5–5.1)
Sodium: 143 mEq/L (ref 136–145)
TOTAL PROTEIN: 7.9 g/dL (ref 6.4–8.3)

## 2016-02-18 LAB — TSH: TSH: 2.208 m[IU]/L (ref 0.308–3.960)

## 2016-02-18 MED ORDER — SODIUM CHLORIDE 0.9 % IV SOLN
240.0000 mg | Freq: Once | INTRAVENOUS | Status: AC
  Administered 2016-02-18: 240 mg via INTRAVENOUS
  Filled 2016-02-18: qty 24

## 2016-02-18 MED ORDER — SODIUM CHLORIDE 0.9 % IV SOLN
Freq: Once | INTRAVENOUS | Status: AC
  Administered 2016-02-18: 13:00:00 via INTRAVENOUS

## 2016-02-18 NOTE — Patient Instructions (Signed)
[facility] Discharge Instructions for Patients Receiving Chemotherapy  Today you received the following chemotherapy agents:  Nivolumab.  To help prevent nausea and vomiting after your treatment, we encourage you to take your nausea medication as directed.   If you develop nausea and vomiting that is not controlled by your nausea medication, call the clinic.   BELOW ARE SYMPTOMS THAT SHOULD BE REPORTED IMMEDIATELY:  *FEVER GREATER THAN 100.5 F  *CHILLS WITH OR WITHOUT FEVER  NAUSEA AND VOMITING THAT IS NOT CONTROLLED WITH YOUR NAUSEA MEDICATION  *UNUSUAL SHORTNESS OF BREATH  *UNUSUAL BRUISING OR BLEEDING  TENDERNESS IN MOUTH AND THROAT WITH OR WITHOUT PRESENCE OF ULCERS  *URINARY PROBLEMS  *BOWEL PROBLEMS  UNUSUAL RASH Items with * indicate a potential emergency and should be followed up as soon as possible.  Feel free to call the clinic you have any questions or concerns. The clinic phone number is [phone].  Please show the CHEMO ALERT CARD at check-in to the Emergency Department and triage nurse.   

## 2016-02-18 NOTE — Telephone Encounter (Signed)
Gave patient avs report and appointments for November and December. Per 11/8 los patient needs 11/22 appointments to be later in the day.   Central radiology will call re scan.

## 2016-02-18 NOTE — Progress Notes (Signed)
  [name]   DIAGNOSIS:Progressive non-small cell lung cancer initially diagnosed asStage IIB/IIIA non-small cell lung cancer consistent with adenocarcinoma with negative EGFR mutation and negative ALK gene translocation diagnosed in December 2012.   PRIOR THERAPY:  1) Status post radiation therapy to the right upper lobe lung mass completed on 06/04/2011 under the care of [name].  2) Systemic chemotherapy with carboplatin for AUC of 5 and Alimta 500 mg/M2 giving every 3 weeks, status post 3 cycles, last dose was given on 07/21/2011.  3) Systemic chemotherapy with carboplatin for AUC of 5 and Alimta 500 MG/M2 every 3 weeks. First dose on 01/02/2015. Status post 2 cycles, last dose was given 01/30/2015. Her treatment is currently on hold secondary to intolerance.  CURRENT THERAPY: Immunotherapy with Nivolumab 240 MG every 2 weeks. First dose 08/27/2015. Status post 11 cycles  INTERVAL HISTORY:   [name] returns as scheduled. She completed cycle 11 nivolumab 02/04/2016. She denies nausea/vomiting. No mouth sores. No diarrhea. No rash. No change in baseline dyspnea. She continues supplemental oxygen at 2-3 L/m. She continues to have intermittent dizziness.  Objective:  Vital signs in last 24 hours:  Blood pressure 136/63, pulse 97, temperature 98 F (36.7 C), temperature source Oral, resp. rate 14, height 5' 6" (1.676 m), weight 168 lb (76.2 kg), SpO2 100 %.    HEENT: No thrush or ulcers. Resp: Lungs clear bilaterally. Cardio: Regular rate and rhythm. GI: Abdomen soft and nontender. No hepatomegaly. Vascular: No leg edema.   Lab Results:  Lab Results  Component Value Date   WBC 10.4 (H) 02/18/2016   HGB 12.9 02/18/2016   HCT 39.2 02/18/2016   MCV 91.7 02/18/2016   PLT 364 02/18/2016   NEUTROABS 6.9 (H) 02/18/2016    Imaging:  No results found.  Medications: I have reviewed the patient's current  medications.  Assessment/Plan: 1. History of stage IIB/IIIA non-small cell lung cancer December 2012 status post concurrent chemoradiation followed by consolidation chemotherapy with carboplatin and Alimta. Molecular study for EGFR mutation negative. Evidence of disease progression with continuous enlargement of a right upper lobe opacity on CT 12/02/2014. She is status post systemic chemotherapy with carboplatin and Alimta x 2 cycles with treatment subsequently placed on hold secondary to increasing fatigue and weakness as well as pancytopenia. Restaging scan of the chest performed on 04/07/2016 showed mild improvement. CT scan of the chest performed on 08/12/2015 showed evidence for disease progression in the right lung. Treatment initiated with nivolumab 08/27/2015.To date she has completed 11 cycles. 2. Dizziness. MRI brain 01/23/2016 negative for evidence of metastatic disease.   Disposition: [name] appears unchanged. She has completed 11 cycles of nivolumab. Plan to proceed with cycle 12 today as scheduled. Restaging noncontrast chest CT a few days prior to her next visit in 2 weeks.  She will return for a follow-up visit 03/03/2016. She will contact the office in the interim with any problems.  Plan reviewed with [name].    [name] ANP/GNP-BC   02/18/2016  12:51 PM

## 2016-02-26 ENCOUNTER — Other Ambulatory Visit: Payer: Self-pay | Admitting: Internal Medicine

## 2016-02-26 ENCOUNTER — Other Ambulatory Visit: Payer: Self-pay | Admitting: *Deleted

## 2016-02-26 DIAGNOSIS — I739 Peripheral vascular disease, unspecified: Secondary | ICD-10-CM

## 2016-02-27 ENCOUNTER — Other Ambulatory Visit: Payer: Self-pay

## 2016-02-27 DIAGNOSIS — G44229 Chronic tension-type headache, not intractable: Secondary | ICD-10-CM

## 2016-02-27 DIAGNOSIS — G8929 Other chronic pain: Secondary | ICD-10-CM

## 2016-02-27 DIAGNOSIS — M25552 Pain in left hip: Secondary | ICD-10-CM

## 2016-02-27 DIAGNOSIS — M5136 Other intervertebral disc degeneration, lumbar region: Secondary | ICD-10-CM

## 2016-02-27 MED ORDER — ALPRAZOLAM 1 MG PO TABS: ORAL_TABLET | ORAL | 0 refills | Status: DC

## 2016-02-27 MED ORDER — HYDROCODONE-ACETAMINOPHEN 10-325 MG PO TABS: ORAL_TABLET | ORAL | 0 refills | Status: DC

## 2016-02-27 NOTE — Telephone Encounter (Signed)
Left message on machine that rx is ready for pick-up, and it will be at our front desk.  

## 2016-03-02 ENCOUNTER — Other Ambulatory Visit: Payer: Self-pay | Admitting: Medical Oncology

## 2016-03-02 ENCOUNTER — Ambulatory Visit (HOSPITAL_COMMUNITY)
Admission: RE | Admit: 2016-03-02 | Discharge: 2016-03-02 | Disposition: A | Discharge status: Home / Self Care | Payer: Medicare Other | Source: Ambulatory Visit | Attending: Nurse Practitioner | Admitting: Nurse Practitioner

## 2016-03-02 DIAGNOSIS — K8020 Calculus of gallbladder without cholecystitis without obstruction: Secondary | ICD-10-CM | POA: Diagnosis not present

## 2016-03-02 DIAGNOSIS — C3411 Malignant neoplasm of upper lobe, right bronchus or lung: Secondary | ICD-10-CM

## 2016-03-02 DIAGNOSIS — J439 Emphysema, unspecified: Secondary | ICD-10-CM | POA: Insufficient documentation

## 2016-03-02 DIAGNOSIS — I2510 Atherosclerotic heart disease of native coronary artery without angina pectoris: Secondary | ICD-10-CM | POA: Insufficient documentation

## 2016-03-02 DIAGNOSIS — I700 Atherosclerosis of aorta: Secondary | ICD-10-CM | POA: Insufficient documentation

## 2016-03-02 DIAGNOSIS — C3490 Malignant neoplasm of unspecified part of unspecified bronchus or lung: Secondary | ICD-10-CM | POA: Diagnosis not present

## 2016-03-03 ENCOUNTER — Ambulatory Visit (HOSPITAL_BASED_OUTPATIENT_CLINIC_OR_DEPARTMENT_OTHER): Payer: Medicare Other

## 2016-03-03 ENCOUNTER — Other Ambulatory Visit (HOSPITAL_BASED_OUTPATIENT_CLINIC_OR_DEPARTMENT_OTHER): Payer: Medicare Other

## 2016-03-03 ENCOUNTER — Encounter: Payer: Self-pay | Admitting: Internal Medicine

## 2016-03-03 ENCOUNTER — Ambulatory Visit (HOSPITAL_BASED_OUTPATIENT_CLINIC_OR_DEPARTMENT_OTHER): Payer: Medicare Other | Admitting: Internal Medicine

## 2016-03-03 VITALS — BP 159/56 | HR 102 | Temp 98.8°F | Resp 17 | Ht 66.0 in | Wt 170.3 lb

## 2016-03-03 DIAGNOSIS — Z5112 Encounter for antineoplastic immunotherapy: Secondary | ICD-10-CM | POA: Diagnosis not present

## 2016-03-03 DIAGNOSIS — C3411 Malignant neoplasm of upper lobe, right bronchus or lung: Secondary | ICD-10-CM

## 2016-03-03 DIAGNOSIS — K0889 Other specified disorders of teeth and supporting structures: Secondary | ICD-10-CM

## 2016-03-03 DIAGNOSIS — J449 Chronic obstructive pulmonary disease, unspecified: Secondary | ICD-10-CM

## 2016-03-03 DIAGNOSIS — R5382 Chronic fatigue, unspecified: Secondary | ICD-10-CM

## 2016-03-03 LAB — COMPREHENSIVE METABOLIC PANEL
ALBUMIN: 3.7 g/dL (ref 3.5–5.0)
ALK PHOS: 135 U/L (ref 40–150)
ALT: 15 U/L (ref 0–55)
ANION GAP: 13 meq/L — AB (ref 3–11)
AST: 20 U/L (ref 5–34)
BILIRUBIN TOTAL: 0.4 mg/dL (ref 0.20–1.20)
BUN: 17.3 mg/dL (ref 7.0–26.0)
CO2: 25 meq/L (ref 22–29)
Calcium: 10.1 mg/dL (ref 8.4–10.4)
Chloride: 101 mEq/L (ref 98–109)
Creatinine: 1 mg/dL (ref 0.6–1.1)
EGFR: 54 mL/min/{1.73_m2} — AB (ref >90)
Glucose: 108 mg/dl (ref 70–140)
Potassium: 3.5 mEq/L (ref 3.5–5.1)
Sodium: 139 mEq/L (ref 136–145)
TOTAL PROTEIN: 7.6 g/dL (ref 6.4–8.3)

## 2016-03-03 LAB — CBC WITH DIFFERENTIAL/PLATELET
BASO%: 0.4 % (ref 0.0–2.0)
Basophils Absolute: 0 10*3/uL (ref 0.0–0.1)
EOS%: 5.1 % (ref 0.0–7.0)
Eosinophils Absolute: 0.5 10*3/uL (ref 0.0–0.5)
HEMATOCRIT: 35.6 % (ref 34.8–46.6)
HEMOGLOBIN: 12 g/dL (ref 11.6–15.9)
LYMPH#: 2.3 10*3/uL (ref 0.9–3.3)
LYMPH%: 24.5 % (ref 14.0–49.7)
MCH: 30.5 pg (ref 25.1–34.0)
MCHC: 33.7 g/dL (ref 31.5–36.0)
MCV: 90.6 fL (ref 79.5–101.0)
MONO#: 0.8 10*3/uL (ref 0.1–0.9)
MONO%: 8.3 % (ref 0.0–14.0)
NEUT%: 61.7 % (ref 38.4–76.8)
NEUTROS ABS: 5.7 10*3/uL (ref 1.5–6.5)
Platelets: 332 10*3/uL (ref 145–400)
RBC: 3.93 10*6/uL (ref 3.70–5.45)
RDW: 13.2 % (ref 11.2–14.5)
WBC: 9.2 10*3/uL (ref 3.9–10.3)

## 2016-03-03 MED ORDER — SODIUM CHLORIDE 0.9 % IV SOLN
Freq: Once | INTRAVENOUS | Status: AC
  Administered 2016-03-03: 14:00:00 via INTRAVENOUS

## 2016-03-03 MED ORDER — SODIUM CHLORIDE 0.9 % IV SOLN
240.0000 mg | Freq: Once | INTRAVENOUS | Status: AC
  Administered 2016-03-03: 240 mg via INTRAVENOUS
  Filled 2016-03-03: qty 20

## 2016-03-03 NOTE — Patient Instructions (Signed)
[facility] Discharge Instructions for Patients Receiving Chemotherapy  Today you received the following chemotherapy agents:  Nivolumab.  To help prevent nausea and vomiting after your treatment, we encourage you to take your nausea medication as directed.   If you develop nausea and vomiting that is not controlled by your nausea medication, call the clinic.   BELOW ARE SYMPTOMS THAT SHOULD BE REPORTED IMMEDIATELY:  *FEVER GREATER THAN 100.5 F  *CHILLS WITH OR WITHOUT FEVER  NAUSEA AND VOMITING THAT IS NOT CONTROLLED WITH YOUR NAUSEA MEDICATION  *UNUSUAL SHORTNESS OF BREATH  *UNUSUAL BRUISING OR BLEEDING  TENDERNESS IN MOUTH AND THROAT WITH OR WITHOUT PRESENCE OF ULCERS  *URINARY PROBLEMS  *BOWEL PROBLEMS  UNUSUAL RASH Items with * indicate a potential emergency and should be followed up as soon as possible.  Feel free to call the clinic you have any questions or concerns. The clinic phone number is [phone].  Please show the CHEMO ALERT CARD at check-in to the Emergency Department and triage nurse.   

## 2016-03-03 NOTE — Progress Notes (Signed)
[facility] Telephone:([phone]) [phone]   Fax:(336) [phone]  OFFICE PROGRESS NOTE  [name], MD [phone] [place] [address]  DIAGNOSIS: Progressive non-small cell lung cancer initially diagnosed as Stage IIB/IIIA non-small cell lung cancer consistent with adenocarcinoma with negative EGFR mutation and negative ALK gene translocation diagnosed in December 2012.   PRIOR THERAPY:  1) Status post radiation therapy to the right upper lobe lung mass completed on 06/04/2011 under the care of Dr. Pablo Ledger.  2) Systemic chemotherapy with carboplatin for AUC of 5 and Alimta 500 mg/M2 giving every 3 weeks, status post 3 cycles, last dose was given on 07/21/2011.  3) Systemic chemotherapy with carboplatin for AUC of 5 and Alimta 500 MG/M2 every 3 weeks. First dose on 01/02/2015. Status post 2 cycles, last dose was given 01/30/2015. Her treatment is currently on hold secondary to intolerance.  CURRENT THERAPY: Immunotherapy with Nivolumab 240 MG every 2 weeks. First dose 08/27/2015. Status post 12 cycles  INTERVAL HISTORY: [name] 70 y.o. female returns to the clinic today for follow up visit accompanied by her son. The patient has no complaints today except for some dental pain and she would like referral to Dr. Orene Desanctis at the [facility] service for evaluation. She is currently on treatment with immunotherapy with Nivolumab status post 12 cycles. She tolerated the last cycle of her treatment fairly well with no significant adverse effects. She denied having any significant nausea or vomiting. She has no fever or chills. She has no chest pain but continues to have the baseline shortness breath with mild cough and no hemoptysis. She had repeat CT scan of the chest performed recently and she is here for evaluation and discussion of her scan results.  MEDICAL HISTORY: Past Medical History:  Diagnosis Date  . Acute posthemorrhagic anemia   . Acute  sinusitis, unspecified   . Acute upper respiratory infections of unspecified site   . Adenocarcinoma, lung ([place])    upper left lobe  . Anemia in neoplastic disease   . Anemia, unspecified   . Anginal pain ([place])   . Anxiety   . Anxiety state, unspecified   . Arthritis    "all over my body"  . Asthma   . Asymptomatic varicose veins   . Atherosclerosis of native arteries of the extremities, unspecified   . Atrial fibrillation ([place])   . Bronchial pneumonia    history  . 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 Chemotherapy adverse reaction 09/15/11   "makes me light headed and pass out; this is 3rd blood transfusion since going thru it"  . CHF (congestive heart failure) ([place])   . Chronic airway obstruction, not elsewhere classified   . Chronic fatigue 08/19/2015  . Chronic sinusitis   . Complication of anesthesia    slow to wake up  . COPD (chronic obstructive pulmonary disease) ([place])   . Coronary artery disease    Dr. Burt Knack had stress test done  . DDD (degenerative disc disease), lumbar   . Depressive disorder, not elsewhere classified   . Diseases of lips   . Disorder of bone and cartilage, unspecified   . Dizziness and giddiness   . DVT (deep venous thrombosis) ([place])   . Dysrhythmia    irregular  . Effects of radiation, unspecified   . Emphysema   . Encounter for antineoplastic immunotherapy 08/19/2015  . Fibromyalgia   . Functional urinary incontinence   . GERD (gastroesophageal  reflux disease)   . Hard of hearing, right   . Heart murmur   . Herpes zoster without mention of complication   . Hip pain, chronic 09/17/2014  . History of blood clots    "2 in my aorta"  . History of blood transfusion   . History of bronchitis   . Hx of radiation therapy 04/28/11 -06/08/11   right upper lobe-lung  . Hyperlipidemia   . Hyperpotassemia   . Hypertension   . Hypopotassemia   . Kidney infection    "related to chemo"  . Lumbago   . Malignant neoplasm of bronchus and lung,  unspecified site   . Mental disorder   . Muscle weakness (generalized)   . Muscle weakness (generalized)   . Non-small cell lung cancer ([place]) 03/31/2011   Adenocarcinoma Rt upper lobe mass  . Osteoarthrosis, unspecified whether generalized or localized, unspecified site   . Osteoporosis   . Other abnormal blood chemistry   . Other and unspecified hyperlipidemia   . PAD (peripheral artery disease) ([place])   . Pain in thoracic spine   . Peripheral vascular disease ([place])   . 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 Primary cancer of right upper lobe of lung ([place]) 02/08/2011   Biopsy Rt upper lobe mass 03/31/11>>adenocarcinoma DIAGNOSIS: Stage IIB/IIIA non-small cell lung cancer consistent with adenocarcinoma diagnosed in December 2012.   PRIOR THERAPY:  1) Status post radiation therapy to the right upper lobe lung mass completed on 06/04/2011 under the care of Dr. Pablo Ledger.  2) Systemic chemotherapy with carboplatin for AUC of 5 and Alimta 500 mg/M2 giving every 3   . Reflux   . Reflux esophagitis   . Respiratory failure with hypoxia 01/02/2010  . Restless legs syndrome (RLS)   . Scoliosis   . Screening for thyroid disorder   . Shortness of breath    "anytime really"  . Sinus headache    "all the time"  . Spasm of muscle   . Syncope and collapse   . Type II or unspecified type diabetes mellitus with neurological manifestations, uncontrolled(250.62)   . Type II or unspecified type diabetes mellitus without mention of complication, not stated as uncontrolled   . Type II or unspecified type diabetes mellitus without mention of complication, not stated as uncontrolled   . Unspecified constipation   . Unspecified essential hypertension   . Unspecified hearing loss   . Unspecified hereditary and idiopathic peripheral neuropathy   . Unspecified vitamin D deficiency   . Urinary tract infection, site not specified     ALLERGIES:  is allergic to iohexol; other; anoro ellipta  [umeclidinium-vilanterol]; codeine; and protonix [pantoprazole sodium].  MEDICATIONS:  Current Outpatient Prescriptions  Medication Sig Dispense Refill  . ALPRAZolam (XANAX) 1 MG tablet TAKE 1 TABLET BY MOUTH THREE TIMES A DAY AS NEEDED FOR ANXIETY 90 tablet 0  . aspirin 81 MG tablet Take 81 mg by mouth daily.    . cetirizine-pseudoephedrine (ZYRTEC-D) 5-120 MG tablet One twice daily to help dizziness 60 tablet 1  . citalopram (CELEXA) 20 MG tablet TAKE 1 TABLET BY MOUTH EVERY DAY TO HELP MOOD 90 tablet 1  . diltiazem (CARDIZEM CD) 240 MG 24 hr capsule TAKE 1 CAPSULE BY MOUTH EVERY DAY FOR BLOOD PRESSURE. 30 capsule 1  . fluticasone (FLONASE) 50 MCG/ACT nasal spray INSTILL 2 SPRAYS INTO BOTH NOSTRILS  TWICE DAILY *NEEDS APPOINTMENT* 16 g 5  . gemfibrozil (LOPID) 600 MG tablet Take 1 tablet by mouth daily.    Marland Kitchen  glucose blood (FREESTYLE LITE) test strip Use to test blood sugar three times daily. Dx: E11.9, E11.49 100 each 12  . HEMATINIC/FOLIC ACID 536-1 MG TABS TAKE 1 TABLET BY MOUTH EVERY DAY 30 each 2  . HYDROcodone-acetaminophen (NORCO) 10-325 MG tablet Take One tablet by mouth every 6 hours if needed for pain 120 tablet 0  . Ipratropium-Albuterol (COMBIVENT RESPIMAT) 20-100 MCG/ACT AERS respimat Inhale into the lungs. 2 puffs four times a day    . Lancets (FREESTYLE) lancets Use to test blood sugar three times daily. Dx: E11.9, E11.49 100 each 12  . loratadine (CLARITIN) 10 MG tablet TAKE 1 TABLET BY MOUTH EVERY DAY 30 tablet 3  . metFORMIN (GLUCOPHAGE) 500 MG tablet Take by mouth. Take one tablet at night for blood sugar    . Multiple Vitamins-Minerals (CENTRUM SILVER PO) Take 1 tablet by mouth daily. Patient unsure of dose    . NON FORMULARY Place 3 L into the nose daily. Oxygen    . potassium chloride (K-DUR,KLOR-CON) 10 MEQ tablet TAKE 1 TABLET BY MOUTH EVERY DAY 30 tablet 1  . traMADol (ULTRAM) 50 MG tablet TAKE 1 TABLET BY MOUTH THREE TIMES A DAY AS NEEDED FOR PAIN 90 tablet 4  .  triamterene-hydrochlorothiazide (MAXZIDE-25) 37.5-25 MG tablet TAKE 1 TABLET BY MOUTH EVERY DAY TO CONTROL BLOOD PRESSURE 90 tablet 2   No current facility-administered medications for this visit.    Facility-Administered Medications Ordered in Other Visits  Medication Dose Route Frequency Provider Last Rate Last Dose  . hyaluronate sodium (RADIAPLEXRX) gel   Topical Once Thea Silversmith, MD      . topical emolient (BIAFINE) emulsion   Topical PRN Thea Silversmith, MD        SURGICAL HISTORY:  Past Surgical History:  Procedure Laterality Date  . BRONCHOSCOPY  02/2011  . CARDIAC CATHETERIZATION    . CATARACT EXTRACTION  03/12/2013   Left Eye   . DILATION AND CURETTAGE OF UTERUS  1980's  . EYE SURGERY Left 2014   Cataract  . FETAL BLOOD TRANSFUSION  09/16/11  . JOINT REPLACEMENT  04/13/10   Left Hip  . LUNG LOBECTOMY  1986   left  . SEPTOPLASTY    . TOTAL HIP ARTHROPLASTY  01/2010   left  . TUBAL LIGATION  1980's    REVIEW OF SYSTEMS:  Constitutional: positive for fatigue Eyes: negative Ears, nose, mouth, throat, and face: negative Respiratory: positive for cough and dyspnea on exertion Cardiovascular: negative Gastrointestinal: negative Genitourinary:negative Integument/breast: negative Hematologic/lymphatic: negative Musculoskeletal:negative Neurological: negative Behavioral/Psych: negative Endocrine: negative Allergic/Immunologic: negative   PHYSICAL EXAMINATION: General appearance: alert, cooperative, fatigued and no distress Head: Normocephalic, without obvious abnormality, atraumatic Neck: no adenopathy, no JVD, supple, symmetrical, trachea midline and thyroid not enlarged, symmetric, no tenderness/mass/nodules Lymph nodes: Cervical, supraclavicular, and axillary nodes normal. Resp: clear to auscultation bilaterally Back: symmetric, no curvature. ROM normal. No CVA tenderness. Cardio: regular rate and rhythm, S1, S2 normal, no murmur, click, rub or gallop GI: soft,  non-tender; bowel sounds normal; no masses,  no organomegaly Extremities: extremities normal, atraumatic, no cyanosis or edema Neurologic: Alert and oriented X 3, normal strength and tone. Normal symmetric reflexes. Normal coordination and gait  ECOG PERFORMANCE STATUS: 1 - Symptomatic but completely ambulatory  Blood pressure (!) 159/56, pulse (!) 102, temperature 98.8 F (37.1 C), temperature source Oral, resp. rate 17, height 5' 6"  (1.676 m), weight 170 lb 4.8 oz (77.2 kg), SpO2 98 %.  LABORATORY DATA: Lab Results  Component Value Date  WBC 9.2 03/03/2016   HGB 12.0 03/03/2016   HCT 35.6 03/03/2016   MCV 90.6 03/03/2016   PLT 332 03/03/2016      Chemistry      Component Value Date/Time   NA 139 03/03/2016 1127   K 3.5 03/03/2016 1127   CL 97 08/06/2015 1036   CL 99 07/21/2012 1125   CO2 25 03/03/2016 1127   BUN 17.3 03/03/2016 1127   CREATININE 1.0 03/03/2016 1127      Component Value Date/Time   CALCIUM 10.1 03/03/2016 1127   ALKPHOS 135 03/03/2016 1127   AST 20 03/03/2016 1127   ALT 15 03/03/2016 1127   BILITOT 0.40 03/03/2016 1127       RADIOGRAPHIC STUDIES: Ct Chest Wo Contrast  Result Date: 03/03/2016 CLINICAL DATA:  Lung cancer restaging. History of remote left-sided lung cancer with right lung cancer diagnosed 2012. EXAM: CT CHEST WITHOUT CONTRAST TECHNIQUE: Multidetector CT imaging of the chest was performed following the standard protocol without IV contrast. COMPARISON:  01/05/2016. FINDINGS: Cardiovascular: The heart size is normal. No pericardial effusion. Coronary artery calcification is noted. Atherosclerotic calcification is noted in the wall of the thoracic aorta. Mediastinum/Nodes: No mediastinal lymphadenopathy. No evidence for left axillary lymphadenopathy on this noncontrast exam. Right hilum largely obscured by parahilar post treatment fibrosis, similar appearance to prior. There is no axillary lymphadenopathy. The esophagus has normal imaging  features. Lungs/Pleura: Volume loss left hemi thorax compatible with prior left upper lobectomy. Scarring posterior left lung is unchanged in the interval. Parahilar scarring and volume loss in the right hemi thorax is unchanged in the interval. The dominant large central right lung lesion, mainly representing ground-glass attenuation, measures 8.3 x 5.7 cm today compared to 8.5 x 5.5 cm previously, consistent with no substantial interval change. Slice acquisition is a slightly different scan plane today which makes reproducible measurement difficult. There are some scattered satellite nodules, as before. The previously described ground-glass nodule inferior left lung is stable in the interval. Emphysema is evident with scattered areas of bronchial wall thickening. Upper Abdomen: Gallbladder incompletely visualized, but calcified stones are noted measuring up to 9 mm diameter. Small left adrenal nodule measures low attenuation consistent with adenoma. Abdominal aorta incompletely visualized, but demonstrates atherosclerosis. Musculoskeletal: Bone windows reveal no worrisome lytic or sclerotic osseous lesions. 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 The patient is status post upper thoracic vertebral augmentation at T4. Stable appearance superior endplate compression deformity at T5. IMPRESSION: 1. No substantial interval change in appearance of the dominant irregular right lung ground-glass lesion. 2. Coronary artery and thoracoabdominal atherosclerosis. 3. Cholelithiasis. 4. Emphysema. Electronically Signed   By: Misty Stanley M.D.   On: 03/03/2016 10:13   ASSESSMENT AND PLAN: this is a very pleasant 70 years old white female with history of stage IIB/IIIA non-small cell lung cancer status post concurrent chemoradiation followed by consolidation chemotherapy with carboplatin and Alimta and has been observation since April 2013. She has no evidence for disease progression on his recent scan  except for a continuous enlargement of the right upper lobe opacity. The molecular study for EGFR mutation was reported to be negative. The patient was started on treatment with systemic chemotherapy with carboplatin and Alimta status post 2 cycles but her treatment has been on hold secondary to increasing fatigue and weakness as well as pancytopenia. The restaging scan of the chest performed on 04/07/2016 showed mild improvement of her disease. I discussed the scan results with the patient and her daughter. I  recommended for her to continue on observation for now with repeat CT scan of the chest in 3 months for restaging of her disease. The CT scan of the chest performed on 08/12/2015 showed evidence for disease progression in the right lung. The patient is currently on treatment with immunotherapy with Nivolumab status post 12 cycles and tolerating her treatment well.  The recent restaging CT scan of the chest showed no significant change in the dominant right lung mass. There is no evidence for disease progression. I discussed the scan results with the patient and her son. I recommended for her to continue her treatment with Nivolumab with cycle #13 today as a scheduled. The patient would come back for follow-up visit in 2 weeks for reevaluation before starting cycle #14 For the dental pain and issues, I will refer the patient to Dr. Orene Desanctis for evaluation based on her request. She was advised to call immediately if she has any concerning symptoms in the interval. The patient voices understanding of current disease status and treatment options and is in agreement with the current care plan.  All questions were answered. The patient knows to call the clinic with any problems, questions or concerns. We can certainly see the patient much sooner if necessary.  Disclaimer: This note was dictated with voice recognition software. Similar sounding words can inadvertently be transcribed and may not be  corrected upon review.

## 2016-03-05 ENCOUNTER — Telehealth: Payer: Self-pay | Admitting: *Deleted

## 2016-03-05 ENCOUNTER — Telehealth: Payer: Self-pay

## 2016-03-05 MED ORDER — AMOXICILLIN-POT CLAVULANATE 875-125 MG PO TABS: 1.0000 | ORAL_TABLET | Freq: Two times a day (BID) | ORAL | 0 refills | Status: DC

## 2016-03-05 NOTE — Telephone Encounter (Signed)
The PA from [facility] called [facility] to get additional information about the pt.

## 2016-03-05 NOTE — Telephone Encounter (Signed)
Pt called c/o sore throat and chest congestion. Chest soreness. She is a pt of Dr Julien Nordmann and received nivolumab on 11/22.  1113 called pt back and lvm Pt called back. She started sore throat on Wed with chest congestion. She has had productive cough yesterday and today. Her sputum is brown to green. she states she was "burning up" this morning. She has not taken her temp, does not know where thermometer is. She is wanting an antibiotic, she states she gets antibx about every 3-4 months when she gets bacteria. Her chest and head are stopped up. Pt states she has no one to bring her, her daughter would be able to pick up an Rx on lunch break or after work.  S/w Selena Lesser NP and called pt back: it is advisable for the pt to be seen. If she cannot come in she will need to go to ER if any worsening symptoms, fever, chills, SOB. Explained to pt about complications of pleural effusion or pneumonia. Pt again said she cannot make it in. Rx for augmentin e-scribed.  Received call from PA at [facility] ([place] The pt had also left a message with them. The PA reitterated what Cyndee Berniece Salines had said.

## 2016-03-05 NOTE — Telephone Encounter (Signed)
Per LOS I have scheduled appts and notified the scheduler 

## 2016-03-05 NOTE — Addendum Note (Signed)
Addended by: Janace Hoard on: 03/05/2016 12:12 PM   Modules accepted: Orders

## 2016-03-08 ENCOUNTER — Encounter (HOSPITAL_COMMUNITY): Payer: Self-pay | Admitting: Emergency Medicine

## 2016-03-08 ENCOUNTER — Emergency Department (HOSPITAL_COMMUNITY)
Admission: EM | Admit: 2016-03-08 | Discharge: 2016-03-08 | Disposition: A | Discharge status: Home / Self Care | Payer: Medicare Other | Attending: Emergency Medicine | Admitting: Emergency Medicine

## 2016-03-08 ENCOUNTER — Emergency Department (HOSPITAL_COMMUNITY): Payer: Medicare Other

## 2016-03-08 DIAGNOSIS — E1142 Type 2 diabetes mellitus with diabetic polyneuropathy: Secondary | ICD-10-CM | POA: Insufficient documentation

## 2016-03-08 DIAGNOSIS — I110 Hypertensive heart disease with heart failure: Secondary | ICD-10-CM | POA: Diagnosis not present

## 2016-03-08 DIAGNOSIS — Z79899 Other long term (current) drug therapy: Secondary | ICD-10-CM | POA: Diagnosis not present

## 2016-03-08 DIAGNOSIS — I2510 Atherosclerotic heart disease of native coronary artery without angina pectoris: Secondary | ICD-10-CM | POA: Diagnosis not present

## 2016-03-08 DIAGNOSIS — I509 Heart failure, unspecified: Secondary | ICD-10-CM | POA: Insufficient documentation

## 2016-03-08 DIAGNOSIS — Z7984 Long term (current) use of oral hypoglycemic drugs: Secondary | ICD-10-CM | POA: Insufficient documentation

## 2016-03-08 DIAGNOSIS — J069 Acute upper respiratory infection, unspecified: Secondary | ICD-10-CM

## 2016-03-08 DIAGNOSIS — Z96642 Presence of left artificial hip joint: Secondary | ICD-10-CM | POA: Diagnosis not present

## 2016-03-08 DIAGNOSIS — R05 Cough: Secondary | ICD-10-CM | POA: Diagnosis present

## 2016-03-08 DIAGNOSIS — Z85118 Personal history of other malignant neoplasm of bronchus and lung: Secondary | ICD-10-CM | POA: Diagnosis not present

## 2016-03-08 DIAGNOSIS — J449 Chronic obstructive pulmonary disease, unspecified: Secondary | ICD-10-CM | POA: Insufficient documentation

## 2016-03-08 DIAGNOSIS — R0602 Shortness of breath: Secondary | ICD-10-CM | POA: Diagnosis not present

## 2016-03-08 DIAGNOSIS — Z87891 Personal history of nicotine dependence: Secondary | ICD-10-CM | POA: Diagnosis not present

## 2016-03-08 LAB — I-STAT CHEM 8, ED
BUN: 13 mg/dL (ref 6–20)
CREATININE: 1 mg/dL (ref 0.44–1.00)
Calcium, Ion: 1.15 mmol/L (ref 1.15–1.40)
Chloride: 95 mmol/L — ABNORMAL LOW (ref 101–111)
Glucose, Bld: 123 mg/dL — ABNORMAL HIGH (ref 65–99)
HEMATOCRIT: 33 % — AB (ref 36.0–46.0)
HEMOGLOBIN: 11.2 g/dL — AB (ref 12.0–15.0)
POTASSIUM: 3.1 mmol/L — AB (ref 3.5–5.1)
Sodium: 138 mmol/L (ref 135–145)
TCO2: 31 mmol/L (ref 0–100)

## 2016-03-08 LAB — URINE MICROSCOPIC-ADD ON
Bacteria, UA: NONE SEEN
RBC / HPF: NONE SEEN RBC/hpf (ref 0–5)

## 2016-03-08 LAB — URINALYSIS, ROUTINE W REFLEX MICROSCOPIC
BILIRUBIN URINE: NEGATIVE
GLUCOSE, UA: NEGATIVE mg/dL
KETONES UR: NEGATIVE mg/dL
LEUKOCYTES UA: NEGATIVE
Nitrite: NEGATIVE
Protein, ur: 30 mg/dL — AB
Specific Gravity, Urine: 1.016 (ref 1.005–1.030)
pH: 7 (ref 5.0–8.0)

## 2016-03-08 LAB — CBC WITH DIFFERENTIAL/PLATELET
BASOS ABS: 0 10*3/uL (ref 0.0–0.1)
BASOS PCT: 0 %
Eosinophils Absolute: 0.3 10*3/uL (ref 0.0–0.7)
Eosinophils Relative: 4 %
HEMATOCRIT: 33.4 % — AB (ref 36.0–46.0)
HEMOGLOBIN: 11.4 g/dL — AB (ref 12.0–15.0)
Lymphocytes Relative: 14 %
Lymphs Abs: 1.3 10*3/uL (ref 0.7–4.0)
MCH: 31 pg (ref 26.0–34.0)
MCHC: 34.1 g/dL (ref 30.0–36.0)
MCV: 90.8 fL (ref 78.0–100.0)
Monocytes Absolute: 0.9 10*3/uL (ref 0.1–1.0)
Monocytes Relative: 10 %
NEUTROS ABS: 6.6 10*3/uL (ref 1.7–7.7)
NEUTROS PCT: 72 %
Platelets: 358 10*3/uL (ref 150–400)
RBC: 3.68 MIL/uL — ABNORMAL LOW (ref 3.87–5.11)
RDW: 12.9 % (ref 11.5–15.5)
WBC: 9.2 10*3/uL (ref 4.0–10.5)

## 2016-03-08 MED ORDER — ALBUTEROL SULFATE (2.5 MG/3ML) 0.083% IN NEBU
5.0000 mg | INHALATION_SOLUTION | Freq: Once | RESPIRATORY_TRACT | Status: AC
  Administered 2016-03-08: 5 mg via RESPIRATORY_TRACT
  Filled 2016-03-08: qty 6

## 2016-03-08 NOTE — ED Notes (Signed)
Pt cannot use restroom at this time, aware urine specimen is needed.  

## 2016-03-08 NOTE — ED Triage Notes (Signed)
Pt has hx of lung cancer, sts she receives chemo treatments every other week. Pt has been seen at cancer center multiple times this week for congestion and productive cough. Pt presents today with worsening SOB and congestion. Pt had CT done last Tuesday that was negative. A&Ox4 and ambulatory. Pt on 3 L O2 at home, has not needed to increase.

## 2016-03-08 NOTE — ED Notes (Signed)
O2 sustained at 100% while ambulating

## 2016-03-08 NOTE — ED Notes (Signed)
Bed: TW44 Expected date:  Expected time:  Means of arrival:  Comments: tr2

## 2016-03-08 NOTE — ED Provider Notes (Signed)
[facility] DEPT Provider Note   CSN: [id number] Arrival date & time: 03/08/16  1552     History   Chief Complaint Chief Complaint  Patient presents with  . Lung Cancer  . Nasal Congestion  . Cough    HPI [name] is a 70 y.o. female.  HPI Patient presents with shortness of breath and cough. She's had for the last 5 days. History of lung cancer and COPD. She has chronic oxygen as needed. This was [name] is her pulmonologist. She is on Spiriva 4 times a day. She had a CAT scan 6 days ago that showed a stable lung mass without new infiltrate at that time. States she began to feel worse after that. States she had her infusion last week Wednesday in the next day began to feel worse. She states that she's had a sore throat and cough with green sputum. States she has been on Augmentin 875 mg and is not getting better. States she is more short of breath with exertion. Slight upper chest pain. She wonders why she is not getting better since she's been on the strong antibiotic. She states she cannot take steroids because of her infusion. No swelling in her legs. He has also been congested.  She also states she had some blood in the urine yesterday. Past Medical History:  Diagnosis Date  . Acute posthemorrhagic anemia   . Acute sinusitis, unspecified   . Acute upper respiratory infections of unspecified site   . 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 Anemia in neoplastic disease   . Anemia, unspecified   . Anginal pain ([place])   . Anxiety   . Anxiety state, unspecified   . Arthritis    "all over my body"  . Asthma   . Asymptomatic varicose veins   . Atherosclerosis of native arteries of the extremities, unspecified   . Atrial fibrillation ([place])   . Bronchial pneumonia    history  . 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 Chemotherapy adverse reaction 09/15/11   "makes me light headed and pass out; this is 3rd blood transfusion since going thru it"  . CHF  (congestive heart failure) ([place])   . Chronic airway obstruction, not elsewhere classified   . Chronic fatigue 08/19/2015  . Chronic sinusitis   . Complication of anesthesia    slow to wake up  . COPD (chronic obstructive pulmonary disease) ([place])   . Coronary artery disease    Dr. Burt Knack had stress test done  . DDD (degenerative disc disease), lumbar   . Depressive disorder, not elsewhere classified   . Diseases of lips   . Disorder of bone and cartilage, unspecified   . Dizziness and giddiness   . DVT (deep venous thrombosis) ([place])   . Dysrhythmia    irregular  . Effects of radiation, unspecified   . Emphysema   . Encounter for antineoplastic immunotherapy 08/19/2015  . Fibromyalgia   . Functional urinary incontinence   . GERD (gastroesophageal reflux disease)   . Hard of hearing, right   . Heart murmur   . Herpes zoster without mention of complication   . Hip pain, chronic 09/17/2014  . History of blood clots    "2 in my aorta"  . History of blood transfusion   . History of bronchitis   . Hx of radiation therapy 04/28/11 -06/08/11   right upper lobe-lung  . Hyperlipidemia   . Hyperpotassemia   . Hypertension   .  Hypopotassemia   . Kidney infection    "related to chemo"  . Lumbago   . Malignant neoplasm of bronchus and lung, unspecified site   . Mental disorder   . Muscle weakness (generalized)   . Muscle weakness (generalized)   . Non-small cell lung cancer ([place]) 03/31/2011   Adenocarcinoma Rt upper lobe mass  . Osteoarthrosis, unspecified whether generalized or localized, unspecified site   . Osteoporosis   . Other abnormal blood chemistry   . Other and unspecified hyperlipidemia   . PAD (peripheral artery disease) ([place])   . Pain in thoracic spine   . Peripheral vascular disease ([place])   . 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 Primary cancer of right upper lobe of lung ([place]) 02/08/2011   Biopsy Rt upper lobe mass 03/31/11>>adenocarcinoma DIAGNOSIS: Stage IIB/IIIA  non-small cell lung cancer consistent with adenocarcinoma diagnosed in December 2012.   PRIOR THERAPY:  1) Status post radiation therapy to the right upper lobe lung mass completed on 06/04/2011 under the care of Dr. Pablo Ledger.  2) Systemic chemotherapy with carboplatin for AUC of 5 and Alimta 500 mg/M2 giving every 3   . Reflux   . Reflux esophagitis   . Respiratory failure with hypoxia 01/02/2010  . Restless legs syndrome (RLS)   . Scoliosis   . Screening for thyroid disorder   . Shortness of breath    "anytime really"  . Sinus headache    "all the time"  . Spasm of muscle   . Syncope and collapse   . Type II or unspecified type diabetes mellitus with neurological manifestations, uncontrolled(250.62)   . Type II or unspecified type diabetes mellitus without mention of complication, not stated as uncontrolled   . Type II or unspecified type diabetes mellitus without mention of complication, not stated as uncontrolled   . Unspecified constipation   . Unspecified essential hypertension   . Unspecified hearing loss   . Unspecified hereditary and idiopathic peripheral neuropathy   . Unspecified vitamin D deficiency   . Urinary tract infection, site not specified     Patient Active Problem List   Diagnosis Date Noted  . Benign paroxysmal positional vertigo 01/20/2016  . Encounter for antineoplastic immunotherapy 08/19/2015  . Chronic fatigue 08/19/2015  . Dysuria 07/29/2015  . Anxiety state 07/29/2015  . Myofascial pain 11/12/2014  . Left thigh pain 10/29/2014  . Headache, tension type, chronic 09/17/2014  . Hip pain, chronic 09/17/2014  . COLD (chronic obstructive lung disease) ([place]) 08/01/2014  . History of fall 05/21/2014  . Insomnia 01/09/2014  . Atypical chest pain 10/23/2013  . Gait abnormality 08/16/2013  . 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 Dysphagia 08/01/2013  . PUD (peptic ulcer disease) 08/01/2013  . Muscle spasm 08/01/2013  .  Myalgia and myositis 08/01/2013  . Esophageal reflux 06/27/2013  . Atherosclerosis of native arteries of the extremities with intermittent claudication 10/26/2012  . 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 Restless legs syndrome (RLS)   . GERD (gastroesophageal reflux disease)   . Coronary artery disease   . Heart murmur   . Dysrhythmia   . Anginal pain ([place])   . History of blood clots   . History of bronchitis   . Syncope and collapse   . History of blood transfusion   . Fibromyalgia   . DDD (degenerative disc disease), lumbar   . Scoliosis   . Mental disorder   . CHF (congestive heart failure) ([place])   . Hx of radiation  therapy   . Back pain 11/26/2011  . Chronic cough 11/26/2011  . Atherosclerosis of native arteries of the extremities with ulceration(440.23) 09/23/2011  . SOB (shortness of breath) 09/15/2011  . Weakness generalized 09/15/2011  . Anemia 09/15/2011  . Primary cancer of right upper lobe of lung ([place]) 02/08/2011  . Respiratory failure with hypoxia ([place]) 01/02/2010  . PVD 08/06/2008  . Dyslipidemia 10/28/2006  . Essential hypertension 10/28/2006  . Sinusitis, chronic 10/28/2006  . COPD (chronic obstructive pulmonary disease) ([place]) 10/28/2006    Past Surgical History:  Procedure Laterality Date  . BRONCHOSCOPY  02/2011  . CARDIAC CATHETERIZATION    . CATARACT EXTRACTION  03/12/2013   Left Eye   . DILATION AND CURETTAGE OF UTERUS  1980's  . EYE SURGERY Left 2014   Cataract  . FETAL BLOOD TRANSFUSION  09/16/11  . JOINT REPLACEMENT  04/13/10   Left Hip  . LUNG LOBECTOMY  1986   left  . SEPTOPLASTY    . TOTAL HIP ARTHROPLASTY  01/2010   left  . TUBAL LIGATION  1980's    OB History    No data available       Home Medications    Prior to Admission medications   Medication Sig Start Date End Date Taking? Authorizing Provider  ALPRAZolam (XANAX) 1 MG tablet TAKE 1 TABLET BY MOUTH THREE TIMES A DAY AS NEEDED FOR ANXIETY Patient taking differently: Take 1  mg by mouth 3 (three) times daily as needed for anxiety or sleep.  02/27/16  Yes Tiffany L Reed, DO  amoxicillin-clavulanate (AUGMENTIN) 875-125 MG tablet Take 1 tablet by mouth 2 (two) times daily. 03/05/16  Yes Susanne Borders, NP  aspirin 81 MG tablet Take 81 mg by mouth daily.   Yes Historical Provider, MD  cetirizine-pseudoephedrine (ZYRTEC-D) 5-120 MG tablet One twice daily to help dizziness Patient taking differently: Take 1 tablet by mouth 2 (two) times daily as needed for allergies (dizzeness).  01/20/16  Yes Estill Dooms, MD  citalopram (CELEXA) 20 MG tablet TAKE 1 TABLET BY MOUTH EVERY DAY TO HELP MOOD Patient taking differently: TAKE '20mg'$  TABLET BY MOUTH EVERY DAY TO HELP MOOD 10/03/15  Yes Estill Dooms, MD  diltiazem (CARDIZEM CD) 240 MG 24 hr capsule TAKE 1 CAPSULE BY MOUTH EVERY DAY FOR BLOOD PRESSURE. Patient taking differently: TAKE '240mg'$  CAPSULE BY MOUTH EVERY DAY FOR BLOOD PRESSURE. 02/27/16  Yes Estill Dooms, MD  fluticasone (FLONASE) 50 MCG/ACT nasal spray INSTILL 2 SPRAYS INTO BOTH NOSTRILS  TWICE DAILY *NEEDS APPOINTMENT* Patient taking differently: INSTILL 2 SPRAYS INTO BOTH NOSTRILS  TWICE DAILY as needed for allergies *NEEDS APPOINTMENT* 06/13/15  Yes Collene Gobble, MD  gemfibrozil (LOPID) 600 MG tablet Take 600 mg by mouth daily.  02/23/16  Yes Historical Provider, MD  glucose blood (FREESTYLE LITE) test strip Use to test blood sugar three times daily. Dx: E11.9, E11.49 06/10/15  Yes Estill Dooms, MD  HYDROcodone-acetaminophen [facility]) 10-325 MG tablet Take One tablet by mouth every 6 hours if needed for pain Patient taking differently: Take 1 tablet by mouth every 6 (six) hours as needed for moderate pain.  02/27/16  Yes Tiffany L Reed, DO  Ipratropium-Albuterol (COMBIVENT RESPIMAT) 20-100 MCG/ACT AERS respimat Inhale 2 puffs into the lungs 4 (four) times daily.    Yes Historical Provider, MD  loratadine (CLARITIN) 10 MG tablet TAKE 1 TABLET BY MOUTH EVERY DAY Patient  taking differently: TAKE '10mg'$  TABLET BY MOUTH once daily as needed for  allergies 07/08/15  Yes Estill Dooms, MD  metFORMIN (GLUCOPHAGE) 500 MG tablet Take 500 mg by mouth daily as needed (high blood sugar- 160+). Take one tablet at night for blood sugar    Yes Historical Provider, MD  Multiple Vitamins-Minerals (CENTRUM SILVER PO) Take 1 tablet by mouth daily. Patient unsure of dose   Yes Historical Provider, MD  NON FORMULARY Place 3 L into the nose daily. Oxygen   Yes Historical Provider, MD  potassium chloride (K-DUR,KLOR-CON) 10 MEQ tablet TAKE 1 TABLET BY MOUTH EVERY DAY Patient taking differently: TAKE 52mq TABLET BY MOUTH EVERY DAY 02/27/16  Yes AEstill Dooms MD  traMADol (ULTRAM) 50 MG tablet TAKE 1 TABLET BY MOUTH THREE TIMES A DAY AS NEEDED FOR PAIN Patient taking differently: TAKE '50mg'$  TABLET BY MOUTH THREE TIMES A DAY AS NEEDED FOR PAIN 01/05/16  Yes JLauree Chandler NP  triamterene-hydrochlorothiazide (MAXZIDE-25) 37.5-25 MG tablet TAKE 1 TABLET BY MOUTH EVERY DAY TO CONTROL BLOOD PRESSURE 04/17/15  Yes AEstill Dooms MD  HEMATINIC/FOLIC ACID 3778-2MG TABS TAKE 1 TABLET BY MOUTH EVERY DAY Patient not taking: Reported on 03/08/2016 11/07/15   MCurt Bears MD  Lancets (FREESTYLE) lancets Use to test blood sugar three times daily. Dx: E11.9, E11.49 06/10/15   AEstill Dooms MD    Family History Family History  Problem Relation Age of Onset  . Bone cancer Father   . Cancer Father     Bone  . Diabetes Mother   . Deep vein thrombosis Mother   . Heart disease Mother     Heart Disease before age 72  PVD  . Hyperlipidemia Mother   . Hypertension Mother   . Varicose Veins Mother   . Arthritis Daughter     Social History Social History  Substance Use Topics  . Smoking status: Former Smoker    Packs/day: 1.50    Years: 40.00    Types: Cigarettes    Quit date: 02/10/1997  . Smokeless tobacco: Never Used  . Alcohol use No     Allergies   Iohexol; Other; Anoro ellipta  [umeclidinium-vilanterol]; Codeine; and Protonix [pantoprazole sodium]   Review of Systems Review of Systems  Constitutional: Positive for appetite change, chills and fever.  HENT: Positive for sore throat.   Respiratory: Positive for cough and shortness of breath. Negative for wheezing.   Cardiovascular: Positive for chest pain.  Gastrointestinal: Positive for nausea. Negative for abdominal pain, constipation and vomiting.  Genitourinary: Positive for hematuria. Negative for dysuria.  Musculoskeletal: Negative for back pain.  Skin: Negative for pallor and rash.  Psychiatric/Behavioral: Negative for confusion.     Physical Exam Updated Vital Signs BP 143/63 (BP Location: Left Arm)   Pulse 95   Temp 97.6 F (36.4 C) (Axillary)   Resp 18   LMP  (Exact Date)   SpO2 100%   Physical Exam  Constitutional: She appears well-developed.  HENT:  Mild posterior pharyngeal erythema without exudate.  Neck: Neck supple.  Cardiovascular:  Mild tachycardia  Pulmonary/Chest:  Mildly harsh breath sounds throughout without frank wheezes.  Abdominal: There is no tenderness.  Musculoskeletal: She exhibits no edema.  Neurological: She is alert.  Skin: Skin is warm. Capillary refill takes less than 2 seconds.     ED Treatments / Results  Labs (all labs ordered are listed, but only abnormal results are displayed) Labs Reviewed  CBC WITH DIFFERENTIAL/PLATELET - Abnormal; Notable for the following:       Result Value  RBC 3.68 (*)    Hemoglobin 11.4 (*)    HCT 33.4 (*)    All other components within normal limits  URINALYSIS, ROUTINE W REFLEX MICROSCOPIC (NOT AT [facility]) - Abnormal; Notable for the following:    Hgb urine dipstick TRACE (*)    Protein, ur 30 (*)    All other components within normal limits  URINE MICROSCOPIC-ADD ON - Abnormal; Notable for the following:    Squamous Epithelial / LPF 6-30 (*)    All other components within normal limits  I-STAT CHEM 8, ED - Abnormal;  Notable for the following:    Potassium 3.1 (*)    Chloride 95 (*)    Glucose, Bld 123 (*)    Hemoglobin 11.2 (*)    HCT 33.0 (*)    All other components within normal limits    EKG  EKG Interpretation  Date/Time:  Monday March 08 2016 16:01:00 EST Ventricular Rate:  112 PR Interval:    QRS Duration: 94 QT Interval:  367 QTC Calculation: 501 R Axis:   49 Text Interpretation:  Sinus tachycardia Low voltage, precordial leads RSR' in V1 or V2, right VCD or RVH Prolonged QT interval No significant change since last tracing Confirmed by Alvino Chapel  MD, Kenya Shiraishi [phone]) on 03/08/2016 4:39:53 PM       Radiology Dg Chest 2 View  Result Date: 03/08/2016 CLINICAL DATA:  Worsening shortness of breath. EXAM: CHEST  2 VIEW COMPARISON:  Chest x-ray April 15, 2015 and multiple CT scans since October 27, 2015 FINDINGS: The opacity in the right mid lung, better characterized on previous CT imaging, is similar in appearance on today's study compared to the scout view from the March 02, 2016 CT scan. No other interval changes. No pneumothorax. IMPRESSION: Persistent opacity in the right upper lobe, similar on today's study when compared to the scout views from the September 25th in March 02, 2016 CT scans. Electronically Signed   By: Dorise Bullion III M.D   On: 03/08/2016 16:36    Procedures Procedures (including critical care time)  Medications Ordered in ED Medications  albuterol (PROVENTIL) (2.5 MG/3ML) 0.083% nebulizer solution 5 mg (5 mg Nebulization Given 03/08/16 1657)     Initial Impression / Assessment and Plan / ED Course  I have reviewed the triage vital signs and the nursing notes.  Pertinent labs & imaging results that were available during my care of the patient were reviewed by me and considered in my medical decision making (see chart for details).  Clinical Course     Patient was shortness of breath and cough. Already on antibiotics. History of lung cancer. Has had  sputum is now green. Sore throat. Sounds to be URI not on abnormality such as pulmonary embolism. X-ray does not show pneumonia. Not hypoxic with ambulation.  Final Clinical Impressions(s) / ED Diagnoses   Final diagnoses:  Upper respiratory tract infection, unspecified type    New Prescriptions Discharge Medication List as of 03/08/2016  9:14 PM       Davonna Belling, MD 03/09/16 [id number]

## 2016-03-15 ENCOUNTER — Telehealth: Payer: Self-pay | Admitting: Emergency Medicine

## 2016-03-15 NOTE — Telephone Encounter (Signed)
Spoke with pt and advised of Dr Agustina Caroli recommendations.  PT states she cannot take any kind of steroid due to the chemo infusions she is taking. I offered pt an appt to be seen tomorrow.  She states that she doesn't think she can come because she feels so bad.  She is due to see Dr [name] on 03/17/16.  Pt would like to know if you have any further recommendations.  Please advise.

## 2016-03-15 NOTE — Telephone Encounter (Signed)
Have her take pred taper > Take '40mg'$  daily for 3 days, then '30mg'$  daily for 3 days, then '20mg'$  daily for 3 days, then '10mg'$  daily for 3 days, then stop She also needs to make an OV with [place] if still symptomatic in 2 days

## 2016-03-15 NOTE — Telephone Encounter (Signed)
Pt c/o worsening sore throat, sinus congestion, PND, prod cough with yellow mucus, fatigue, weakness X3 weeks.  Denies fever, chest pain.   Pt has been augmentin bid X10 days, took last tablet today.  Pt was put on augmentin by Dr. [name].  Pt is not feeling any better and is requesting further recs.    Pt uses [facility] on [address].    RB please advise on recs.  Thanks!

## 2016-03-16 ENCOUNTER — Telehealth: Payer: Self-pay

## 2016-03-16 NOTE — Telephone Encounter (Signed)
Pt called stating she is still sick, she finished her antibiotic, she has appt tomorrow, what is she to do. Clarified pt has head ache, chest congestion causing ache in her chest, her sputum went from green to yellow. Now there are some streaks of blood in sputum. She is weak. S/w cyndee to verify she does not need to be seen today, tomorrow appt with Dr Julien Nordmann OK. Pt aware to come in at 1300.

## 2016-03-17 ENCOUNTER — Telehealth: Payer: Self-pay | Admitting: Internal Medicine

## 2016-03-17 ENCOUNTER — Ambulatory Visit (HOSPITAL_BASED_OUTPATIENT_CLINIC_OR_DEPARTMENT_OTHER): Payer: Medicare Other

## 2016-03-17 ENCOUNTER — Encounter: Payer: Self-pay | Admitting: Internal Medicine

## 2016-03-17 ENCOUNTER — Other Ambulatory Visit (HOSPITAL_BASED_OUTPATIENT_CLINIC_OR_DEPARTMENT_OTHER): Payer: Medicare Other

## 2016-03-17 ENCOUNTER — Other Ambulatory Visit: Payer: Self-pay | Admitting: *Deleted

## 2016-03-17 ENCOUNTER — Ambulatory Visit (HOSPITAL_BASED_OUTPATIENT_CLINIC_OR_DEPARTMENT_OTHER): Payer: Medicare Other | Admitting: Internal Medicine

## 2016-03-17 VITALS — BP 167/59 | HR 90 | Temp 98.4°F | Resp 20

## 2016-03-17 DIAGNOSIS — R630 Anorexia: Secondary | ICD-10-CM | POA: Diagnosis not present

## 2016-03-17 DIAGNOSIS — K0889 Other specified disorders of teeth and supporting structures: Secondary | ICD-10-CM | POA: Diagnosis not present

## 2016-03-17 DIAGNOSIS — R0981 Nasal congestion: Secondary | ICD-10-CM

## 2016-03-17 DIAGNOSIS — R059 Cough, unspecified: Secondary | ICD-10-CM

## 2016-03-17 DIAGNOSIS — J449 Chronic obstructive pulmonary disease, unspecified: Secondary | ICD-10-CM | POA: Diagnosis not present

## 2016-03-17 DIAGNOSIS — C3411 Malignant neoplasm of upper lobe, right bronchus or lung: Secondary | ICD-10-CM | POA: Diagnosis not present

## 2016-03-17 DIAGNOSIS — Z5112 Encounter for antineoplastic immunotherapy: Secondary | ICD-10-CM | POA: Diagnosis not present

## 2016-03-17 DIAGNOSIS — R51 Headache: Secondary | ICD-10-CM | POA: Diagnosis not present

## 2016-03-17 DIAGNOSIS — R5382 Chronic fatigue, unspecified: Secondary | ICD-10-CM

## 2016-03-17 DIAGNOSIS — H9209 Otalgia, unspecified ear: Secondary | ICD-10-CM

## 2016-03-17 DIAGNOSIS — R05 Cough: Secondary | ICD-10-CM

## 2016-03-17 LAB — CBC WITH DIFFERENTIAL/PLATELET
BASO%: 0.6 % (ref 0.0–2.0)
Basophils Absolute: 0.1 10*3/uL (ref 0.0–0.1)
EOS%: 4.7 % (ref 0.0–7.0)
Eosinophils Absolute: 0.5 10*3/uL (ref 0.0–0.5)
HEMATOCRIT: 36.5 % (ref 34.8–46.6)
HEMOGLOBIN: 12 g/dL (ref 11.6–15.9)
LYMPH#: 2.1 10*3/uL (ref 0.9–3.3)
LYMPH%: 21.4 % (ref 14.0–49.7)
MCH: 29.8 pg (ref 25.1–34.0)
MCHC: 32.9 g/dL (ref 31.5–36.0)
MCV: 90.5 fL (ref 79.5–101.0)
MONO#: 0.6 10*3/uL (ref 0.1–0.9)
MONO%: 6.5 % (ref 0.0–14.0)
NEUT%: 66.8 % (ref 38.4–76.8)
NEUTROS ABS: 6.7 10*3/uL — AB (ref 1.5–6.5)
PLATELETS: 409 10*3/uL — AB (ref 145–400)
RBC: 4.03 10*6/uL (ref 3.70–5.45)
RDW: 13.3 % (ref 11.2–14.5)
WBC: 10 10*3/uL (ref 3.9–10.3)

## 2016-03-17 LAB — COMPREHENSIVE METABOLIC PANEL
ALBUMIN: 3.5 g/dL (ref 3.5–5.0)
ALK PHOS: 127 U/L (ref 40–150)
ALT: 11 U/L (ref 0–55)
ANION GAP: 13 meq/L — AB (ref 3–11)
AST: 17 U/L (ref 5–34)
BILIRUBIN TOTAL: 0.29 mg/dL (ref 0.20–1.20)
BUN: 14.8 mg/dL (ref 7.0–26.0)
CALCIUM: 10.1 mg/dL (ref 8.4–10.4)
CO2: 27 mEq/L (ref 22–29)
CREATININE: 0.9 mg/dL (ref 0.6–1.1)
Chloride: 100 mEq/L (ref 98–109)
EGFR: 61 mL/min/{1.73_m2} — ABNORMAL LOW (ref >90)
Glucose: 107 mg/dl (ref 70–140)
Potassium: 3.3 mEq/L — ABNORMAL LOW (ref 3.5–5.1)
Sodium: 139 mEq/L (ref 136–145)
TOTAL PROTEIN: 7.2 g/dL (ref 6.4–8.3)

## 2016-03-17 MED ORDER — SODIUM CHLORIDE 0.9 % IV SOLN
240.0000 mg | Freq: Once | INTRAVENOUS | Status: AC
  Administered 2016-03-17: 240 mg via INTRAVENOUS
  Filled 2016-03-17: qty 4

## 2016-03-17 MED ORDER — SODIUM CHLORIDE 0.9 % IV SOLN
Freq: Once | INTRAVENOUS | Status: AC
  Administered 2016-03-17: 16:00:00 via INTRAVENOUS

## 2016-03-17 MED ORDER — SODIUM CHLORIDE 0.9 % IV SOLN
INTRAVENOUS | Status: DC
  Administered 2016-03-17: 16:00:00 via INTRAVENOUS

## 2016-03-17 NOTE — Telephone Encounter (Signed)
Have her try symptomatic relief > tylenol cold&flu. ? Whether she would be willing to come get flu swab, although it may be too late for her to benefit from tamiflu

## 2016-03-17 NOTE — Progress Notes (Signed)
[place] [phone]   [phone]  OFFICE PROGRESS NOTE  [name], MD [phone] [place] [address]  DIAGNOSIS: Progressive non-small cell lung cancer initially diagnosed as stage IIB/IIIA adenocarcinoma with negative EGFR and ALK mutations in December 2012.  PRIOR THERAPY: 1) Status post radiation therapy to the right upper lobe lung mass completed on 06/04/2011 under the care of Dr. Pablo Ledger.  2) Systemic chemotherapy with carboplatin for AUC of 5 and Alimta 500 mg/M2 giving every 3 weeks, status post 3 cycles, last dose was given on 07/21/2011.  3) Systemic chemotherapy with carboplatin for AUC of 5 and Alimta 500 MG/M2 every 3 weeks. First dose on 01/02/2015. Status post 2 cycles, last dose was given 01/30/2015. Her treatment is currently on hold secondary to intolerance.  CURRENT THERAPY: Immunotherapy with Nivolumab 240 MG IV every 2 weeks status post 13 cycles. First dose was given 08/27/2015.  INTERVAL HISTORY: [name] 70 y.o. female returns to the clinic today for follow-up visit. The patient has several complaints today including headache and earache as well as nasal congestion and cough for the last 3-4 weeks. She was treated with 10 days course of Augmentin with mild improvement in her symptoms. She also complaining of chills and feeling cold all the time. She was seen recently at the emergency department and chest x-ray showed no concerning findings for pneumonia. The patient also has shortness of breath and she is currently on home oxygen 3 L/min. She has low-grade fever today. She is good but she is a little bit tachycardic today. She tolerated the last cycle of her treatment with immunotherapy fairly well. She is here today for evaluation before starting cycle #14.  MEDICAL HISTORY: Past Medical History:  Diagnosis Date  . Acute posthemorrhagic anemia   . Acute sinusitis, unspecified   . Acute upper  respiratory infections of unspecified site   . Adenocarcinoma, lung ([place])    upper left lobe  . Anemia in neoplastic disease   . Anemia, unspecified   . Anginal pain ([place])   . Anxiety   . Anxiety state, unspecified   . Arthritis    "all over my body"  . Asthma   . Asymptomatic varicose veins   . Atherosclerosis of native arteries of the extremities, unspecified   . Atrial fibrillation ([place])   . Bronchial pneumonia    history  . 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 Chemotherapy adverse reaction 09/15/11   "makes me light headed and pass out; this is 3rd blood transfusion since going thru it"  . CHF (congestive heart failure) ([place])   . Chronic airway obstruction, not elsewhere classified   . Chronic fatigue 08/19/2015  . Chronic sinusitis   . Complication of anesthesia    slow to wake up  . COPD (chronic obstructive pulmonary disease) ([place])   . Coronary artery disease    Dr. Burt Knack had stress test done  . DDD (degenerative disc disease), lumbar   . Depressive disorder, not elsewhere classified   . Diseases of lips   . Disorder of bone and cartilage, unspecified   . Dizziness and giddiness   . DVT (deep venous thrombosis) ([place])   . Dysrhythmia    irregular  . Effects of radiation, unspecified   . Emphysema   . Encounter for antineoplastic immunotherapy 08/19/2015  . Fibromyalgia   . Functional urinary incontinence   . GERD (gastroesophageal reflux disease)   .  Hard of hearing, right   . Heart murmur   . Herpes zoster without mention of complication   . Hip pain, chronic 09/17/2014  . History of blood clots    "2 in my aorta"  . History of blood transfusion   . History of bronchitis   . Hx of radiation therapy 04/28/11 -06/08/11   right upper lobe-lung  . Hyperlipidemia   . Hyperpotassemia   . Hypertension   . Hypopotassemia   . Kidney infection    "related to chemo"  . Lumbago   . Malignant neoplasm of bronchus and lung, unspecified site   . Mental disorder   .  Muscle weakness (generalized)   . Muscle weakness (generalized)   . Non-small cell lung cancer ([place]) 03/31/2011   Adenocarcinoma Rt upper lobe mass  . Osteoarthrosis, unspecified whether generalized or localized, unspecified site   . Osteoporosis   . Other abnormal blood chemistry   . Other and unspecified hyperlipidemia   . PAD (peripheral artery disease) ([place])   . Pain in thoracic spine   . Peripheral vascular disease ([place])   . 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 Primary cancer of right upper lobe of lung ([place]) 02/08/2011   Biopsy Rt upper lobe mass 03/31/11>>adenocarcinoma DIAGNOSIS: Stage IIB/IIIA non-small cell lung cancer consistent with adenocarcinoma diagnosed in December 2012.   PRIOR THERAPY:  1) Status post radiation therapy to the right upper lobe lung mass completed on 06/04/2011 under the care of Dr. Pablo Ledger.  2) Systemic chemotherapy with carboplatin for AUC of 5 and Alimta 500 mg/M2 giving every 3   . Reflux   . Reflux esophagitis   . Respiratory failure with hypoxia 01/02/2010  . Restless legs syndrome (RLS)   . Scoliosis   . Screening for thyroid disorder   . Shortness of breath    "anytime really"  . Sinus headache    "all the time"  . Spasm of muscle   . Syncope and collapse   . Type II or unspecified type diabetes mellitus with neurological manifestations, uncontrolled(250.62)   . Type II or unspecified type diabetes mellitus without mention of complication, not stated as uncontrolled   . Type II or unspecified type diabetes mellitus without mention of complication, not stated as uncontrolled   . Unspecified constipation   . Unspecified essential hypertension   . Unspecified hearing loss   . Unspecified hereditary and idiopathic peripheral neuropathy   . Unspecified vitamin D deficiency   . Urinary tract infection, site not specified     ALLERGIES:  is allergic to iohexol; other; anoro ellipta [umeclidinium-vilanterol]; codeine; and protonix  [pantoprazole sodium].  MEDICATIONS:  Current Outpatient Prescriptions  Medication Sig Dispense Refill  . ALPRAZolam (XANAX) 1 MG tablet TAKE 1 TABLET BY MOUTH THREE TIMES A DAY AS NEEDED FOR ANXIETY (Patient taking differently: Take 1 mg by mouth 3 (three) times daily as needed for anxiety or sleep. ) 90 tablet 0  . amoxicillin-clavulanate (AUGMENTIN) 875-125 MG tablet Take 1 tablet by mouth 2 (two) times daily. 20 tablet 0  . aspirin 81 MG tablet Take 81 mg by mouth daily.    . cetirizine-pseudoephedrine (ZYRTEC-D) 5-120 MG tablet One twice daily to help dizziness (Patient taking differently: Take 1 tablet by mouth 2 (two) times daily as needed for allergies (dizzeness). ) 60 tablet 1  . citalopram (CELEXA) 20 MG tablet TAKE 1 TABLET BY MOUTH EVERY DAY TO HELP MOOD (Patient taking differently: TAKE 33m TABLET BY MOUTH EVERY DAY TO HELP MOOD)  90 tablet 1  . diltiazem (CARDIZEM CD) 240 MG 24 hr capsule TAKE 1 CAPSULE BY MOUTH EVERY DAY FOR BLOOD PRESSURE. (Patient taking differently: TAKE 270m CAPSULE BY MOUTH EVERY DAY FOR BLOOD PRESSURE.) 30 capsule 1  . fluticasone (FLONASE) 50 MCG/ACT nasal spray INSTILL 2 SPRAYS INTO BOTH NOSTRILS  TWICE DAILY *NEEDS APPOINTMENT* (Patient taking differently: INSTILL 2 SPRAYS INTO BOTH NOSTRILS  TWICE DAILY as needed for allergies *NEEDS APPOINTMENT*) 16 g 5  . gemfibrozil (LOPID) 600 MG tablet Take 600 mg by mouth daily.     .Marland Kitchenglucose blood (FREESTYLE LITE) test strip Use to test blood sugar three times daily. Dx: E11.9, E11.49 100 each 12  . HEMATINIC/FOLIC ACID 3801-6MG TABS TAKE 1 TABLET BY MOUTH EVERY DAY (Patient not taking: Reported on 03/08/2016) 30 each 2  . HYDROcodone-acetaminophen (NORCO) 10-325 MG tablet Take One tablet by mouth every 6 hours if needed for pain (Patient taking differently: Take 1 tablet by mouth every 6 (six) hours as needed for moderate pain. ) 120 tablet 0  . Ipratropium-Albuterol (COMBIVENT RESPIMAT) 20-100 MCG/ACT AERS respimat  Inhale 2 puffs into the lungs 4 (four) times daily.     . Lancets (FREESTYLE) lancets Use to test blood sugar three times daily. Dx: E11.9, E11.49 100 each 12  . loratadine (CLARITIN) 10 MG tablet TAKE 1 TABLET BY MOUTH EVERY DAY (Patient taking differently: TAKE 130mTABLET BY MOUTH once daily as needed for allergies) 30 tablet 3  . metFORMIN (GLUCOPHAGE) 500 MG tablet Take 500 mg by mouth daily as needed (high blood sugar- 160+). Take one tablet at night for blood sugar     . Multiple Vitamins-Minerals (CENTRUM SILVER PO) Take 1 tablet by mouth daily. Patient unsure of dose    . NON FORMULARY Place 3 L into the nose daily. Oxygen    . potassium chloride (K-DUR,KLOR-CON) 10 MEQ tablet TAKE 1 TABLET BY MOUTH EVERY DAY (Patient taking differently: TAKE 1028mTABLET BY MOUTH EVERY DAY) 30 tablet 1  . traMADol (ULTRAM) 50 MG tablet TAKE 1 TABLET BY MOUTH THREE TIMES A DAY AS NEEDED FOR PAIN (Patient taking differently: TAKE 78m44mBLET BY MOUTH THREE TIMES A DAY AS NEEDED FOR PAIN) 90 tablet 4  . triamterene-hydrochlorothiazide (MAXZIDE-25) 37.5-25 MG tablet TAKE 1 TABLET BY MOUTH EVERY DAY TO CONTROL BLOOD PRESSURE 90 tablet 2   No current facility-administered medications for this visit.    Facility-Administered Medications Ordered in Other Visits  Medication Dose Route Frequency Provider Last Rate Last Dose  . hyaluronate sodium (RADIAPLEXRX) gel   Topical Once StacThea Silversmith      . topical emolient (BIAFINE) emulsion   Topical PRN StacThea Silversmith        SURGICAL HISTORY:  Past Surgical History:  Procedure Laterality Date  . BRONCHOSCOPY  02/2011  . CARDIAC CATHETERIZATION    . CATARACT EXTRACTION  03/12/2013   Left Eye   . DILATION AND CURETTAGE OF UTERUS  1980's  . EYE SURGERY Left 2014   Cataract  . FETAL BLOOD TRANSFUSION  09/16/11  . JOINT REPLACEMENT  04/13/10   Left Hip  . LUNG LOBECTOMY  1986   left  . SEPTOPLASTY    . TOTAL HIP ARTHROPLASTY  01/2010   left  . TUBAL  LIGATION  1980's    REVIEW OF SYSTEMS:  Constitutional: positive for anorexia, chills and fatigue Eyes: negative for icterus, irritation and redness Ears, nose, mouth, throat, and face: positive for earaches and nasal congestion  Respiratory: positive for cough, dyspnea on exertion, sputum and wheezing Cardiovascular: negative Gastrointestinal: negative for constipation, diarrhea, nausea and vomiting Genitourinary:negative for dysuria, frequency and hematuria Integument/breast: negative for dryness and rash Hematologic/lymphatic: negative for bleeding and easy bruising Musculoskeletal:positive for muscle weakness Neurological: negative Behavioral/Psych: negative Endocrine: negative Allergic/Immunologic: negative   PHYSICAL EXAMINATION: General appearance: alert, cooperative, fatigued and mild distress Head: Normocephalic, without obvious abnormality, atraumatic Neck: no adenopathy, no JVD, supple, symmetrical, trachea midline and thyroid not enlarged, symmetric, no tenderness/mass/nodules Lymph nodes: Cervical, supraclavicular, and axillary nodes normal. Resp: wheezes bilaterally Back: symmetric, no curvature. ROM normal. No CVA tenderness. Cardio: regular rate and rhythm, S1, S2 normal, no murmur, click, rub or gallop GI: soft, non-tender; bowel sounds normal; no masses,  no organomegaly Extremities: extremities normal, atraumatic, no cyanosis or edema Neurologic: Alert and oriented X 3, normal strength and tone. Normal symmetric reflexes. Normal coordination and gait  ECOG PERFORMANCE STATUS: 1 - Symptomatic but completely ambulatory  There were no vitals taken for this visit.  LABORATORY DATA: Lab Results  Component Value Date   WBC 10.0 03/17/2016   HGB 12.0 03/17/2016   HCT 36.5 03/17/2016   MCV 90.5 03/17/2016   PLT 409 (H) 03/17/2016      Chemistry      Component Value Date/Time   NA 138 03/08/2016 1717   NA 139 03/03/2016 1127   K 3.1 (L) 03/08/2016 1717   K  3.5 03/03/2016 1127   CL 95 (L) 03/08/2016 1717   CL 99 07/21/2012 1125   CO2 25 03/03/2016 1127   BUN 13 03/08/2016 1717   BUN 17.3 03/03/2016 1127   CREATININE 1.00 03/08/2016 1717   CREATININE 1.0 03/03/2016 1127      Component Value Date/Time   CALCIUM 10.1 03/03/2016 1127   ALKPHOS 135 03/03/2016 1127   AST 20 03/03/2016 1127   ALT 15 03/03/2016 1127   BILITOT 0.40 03/03/2016 1127       RADIOGRAPHIC STUDIES: Dg Chest 2 View  Result Date: 03/08/2016 CLINICAL DATA:  Worsening shortness of breath. EXAM: CHEST  2 VIEW COMPARISON:  Chest x-ray April 15, 2015 and multiple CT scans since October 27, 2015 FINDINGS: The opacity in the right mid lung, better characterized on previous CT imaging, is similar in appearance on today's study compared to the scout view from the March 02, 2016 CT scan. No other interval changes. No pneumothorax. IMPRESSION: Persistent opacity in the right upper lobe, similar on today's study when compared to the scout views from the September 25th in March 02, 2016 CT scans. Electronically Signed   By: Dorise Bullion III M.D   On: 03/08/2016 16:36   Ct Chest Wo Contrast  Result Date: 03/03/2016 CLINICAL DATA:  Lung cancer restaging. History of remote left-sided lung cancer with right lung cancer diagnosed 2012. EXAM: CT CHEST WITHOUT CONTRAST TECHNIQUE: Multidetector CT imaging of the chest was performed following the standard protocol without IV contrast. COMPARISON:  01/05/2016. FINDINGS: Cardiovascular: The heart size is normal. No pericardial effusion. Coronary artery calcification is noted. Atherosclerotic calcification is noted in the wall of the thoracic aorta. Mediastinum/Nodes: No mediastinal lymphadenopathy. No evidence for left axillary lymphadenopathy on this noncontrast exam. Right hilum largely obscured by parahilar post treatment fibrosis, similar appearance to prior. There is no axillary lymphadenopathy. The esophagus has normal imaging features.  Lungs/Pleura: Volume loss left hemi thorax compatible with prior left upper lobectomy. Scarring posterior left lung is unchanged in the interval. Parahilar scarring and volume loss in the  right hemi thorax is unchanged in the interval. The dominant large central right lung lesion, mainly representing ground-glass attenuation, measures 8.3 x 5.7 cm today compared to 8.5 x 5.5 cm previously, consistent with no substantial interval change. Slice acquisition is a slightly different scan plane today which makes reproducible measurement difficult. There are some scattered satellite nodules, as before. The previously described ground-glass nodule inferior left lung is stable in the interval. Emphysema is evident with scattered areas of bronchial wall thickening. Upper Abdomen: Gallbladder incompletely visualized, but calcified stones are noted measuring up to 9 mm diameter. Small left adrenal nodule measures low attenuation consistent with adenoma. Abdominal aorta incompletely visualized, but demonstrates atherosclerosis. Musculoskeletal: Bone windows reveal no worrisome lytic or sclerotic osseous lesions. 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 The patient is status post upper thoracic vertebral augmentation at T4. Stable appearance superior endplate compression deformity at T5. IMPRESSION: 1. No substantial interval change in appearance of the dominant irregular right lung ground-glass lesion. 2. Coronary artery and thoracoabdominal atherosclerosis. 3. Cholelithiasis. 4. Emphysema. Electronically Signed   By: Misty Stanley M.D.   On: 03/03/2016 10:13    ASSESSMENT AND PLAN: This is a very pleasant 70 years old white female with multiple medical problems and comorbidities: 1) recurrent non-small cell lung cancer, adenocarcinoma status post several chemotherapy regimens and she is currently on treatment with immunotherapy with Nivolumab status post 13 cycles. She was tolerating her treatment well  with no significant adverse effects. I recommended for the patient to proceed with cycle #14 today as a scheduled. 2) cold symptoms and bronchitis. She just completed a course of Augmentin for 10 days. I recommended for her to take over-the-counter cough medicine and cold medication for now. She was advised to call us immediately if she developed any concerning fever. She is at high-risk to develop pneumonia. 3) COPD: She is currently on several medications for his COPD and I advised the patient to continue taking her medication as prescribed by [name]. 4) local for appetite and poor by mouth intake: I will arrange for the patient to receive 500 mL of normal saline in the clinic today. 5) Dental pain: I referred her again to see Dr. Orene Desanctis for evaluation. She would come back for follow-up visit in 2 weeks for evaluation before starting the next cycle of her treatment with immunotherapy.  The patient was advised to call immediately if she has any concerning symptoms in the interval. The patient voices understanding of current disease status and treatment options and is in agreement with the current care plan.  All questions were answered. The patient knows to call the clinic with any problems, questions or concerns. We can certainly see the patient much sooner if necessary.  Disclaimer: This note was dictated with voice recognition software. Similar sounding words can inadvertently be transcribed and may not be corrected upon review.

## 2016-03-17 NOTE — Patient Instructions (Addendum)
[facility] Discharge Instructions for Patients Receiving Chemotherapy  Today you received the following chemotherapy agents:  Nivolumab.  To help prevent nausea and vomiting after your treatment, we encourage you to take your nausea medication as directed.   If you develop nausea and vomiting that is not controlled by your nausea medication, call the clinic.   BELOW ARE SYMPTOMS THAT SHOULD BE REPORTED IMMEDIATELY:  *FEVER GREATER THAN 100.5 F  *CHILLS WITH OR WITHOUT FEVER  NAUSEA AND VOMITING THAT IS NOT CONTROLLED WITH YOUR NAUSEA MEDICATION  *UNUSUAL SHORTNESS OF BREATH  *UNUSUAL BRUISING OR BLEEDING  TENDERNESS IN MOUTH AND THROAT WITH OR WITHOUT PRESENCE OF ULCERS  *URINARY PROBLEMS  *BOWEL PROBLEMS  UNUSUAL RASH Items with * indicate a potential emergency and should be followed up as soon as possible.  Feel free to call the clinic you have any questions or concerns. The clinic phone number is [phone].  Please show the [facility] at check-in to the Emergency Department and triage nurse.   Dehydration, Adult Dehydration is a condition in which there is not enough fluid or water in the body. This happens when you lose more fluids than you take in. Important organs, such as the kidneys, brain, and heart, cannot function without a proper amount of fluids. Any loss of fluids from the body can lead to dehydration. Dehydration can range from mild to severe. This condition should be treated right away to prevent it from becoming severe. What are the causes? This condition may be caused by:  Vomiting.  Diarrhea.  Excessive sweating, such as from heat exposure or exercise.  Not drinking enough fluid, especially:  When ill.  While doing activity that requires a lot of energy.  Excessive urination.  Fever.  Infection.  Certain medicines, such as medicines that cause the body to lose excess fluid (diuretics).  Inability to access safe  drinking water.  Reduced physical ability to get adequate water and food. What increases the risk? This condition is more likely to develop in people:  Who have a poorly controlled long-term (chronic) illness, such as diabetes, heart disease, or kidney disease.  Who are age 72 or older.  Who are disabled.  Who live in a place with high altitude.  Who play endurance sports. What are the signs or symptoms? Symptoms of mild dehydration may include:  Thirst.  Dry lips.  Slightly dry mouth.  Dry, warm skin.  Dizziness. Symptoms of moderate dehydration may include:  Very dry mouth.  Muscle cramps.  Dark urine. Urine may be the color of tea.  Decreased urine production.  Decreased tear production.  Heartbeat that is irregular or faster than normal (palpitations).  Headache.  Light-headedness, especially when you stand up from a sitting position.  Fainting (syncope). Symptoms of severe dehydration may include:  Changes in skin, such as:  Cold and clammy skin.  Blotchy (mottled) or pale skin.  Skin that does not quickly return to normal after being lightly pinched and released (poor skin turgor).  Changes in body fluids, such as:  Extreme thirst.  No tear production.  Inability to sweat when body temperature is high, such as in hot weather.  Very little urine production.  Changes in vital signs, such as:  Weak pulse.  Pulse that is more than 100 beats a minute when sitting still.  Rapid breathing.  Low blood pressure.  Other changes, such as:  Sunken eyes.  Cold hands and feet.  Confusion.  Lack of energy (  lethargy).  Difficulty waking up from sleep.  Short-term weight loss.  Unconsciousness. How is this diagnosed? This condition is diagnosed based on your symptoms and a physical exam. Blood and urine tests may be done to help confirm the diagnosis. How is this treated? Treatment for this condition depends on the severity. Mild or  moderate dehydration can often be treated at home. Treatment should be started right away. Do not wait until dehydration becomes severe. Severe dehydration is an emergency and it needs to be treated in a hospital. Treatment for mild dehydration may include:  Drinking more fluids.  Replacing salts and minerals in your blood (electrolytes) that you may have lost. Treatment for moderate dehydration may include:  Drinking an oral rehydration solution (ORS). This is a drink that helps you replace fluids and electrolytes (rehydrate). It can be found at pharmacies and retail stores. Treatment for severe dehydration may include:  Receiving fluids through an IV tube.  Receiving an electrolyte solution through a feeding tube that is passed through your nose and into your stomach (nasogastric tube, or NG tube).  Correcting any abnormalities in electrolytes.  Treating the underlying cause of dehydration. Follow these instructions at home:  If directed by your health care provider, drink an ORS:  Make an ORS by following instructions on the package.  Start by drinking small amounts, about  cup (120 mL) every 5-10 minutes.  Slowly increase how much you drink until you have taken the amount recommended by your health care provider.  Drink enough clear fluid to keep your urine clear or pale yellow. If you were told to drink an ORS, finish the ORS first, then start slowly drinking other clear fluids. Drink fluids such as:  Water. Do not drink only water. Doing that can lead to having too little salt (sodium) in the body (hyponatremia).  Ice chips.  Fruit juice that you have added water to (diluted fruit juice).  Low-calorie sports drinks.  Avoid:  Alcohol.  Drinks that contain a lot of sugar. These include high-calorie sports drinks, fruit juice that is not diluted, and soda.  Caffeine.  Foods that are greasy or contain a lot of fat or sugar.  Take over-the-counter and prescription  medicines only as told by your health care provider.  Do not take sodium tablets. This can lead to having too much sodium in the body (hypernatremia).  Eat foods that contain a healthy balance of electrolytes, such as bananas, oranges, potatoes, tomatoes, and spinach.  Keep all follow-up visits as told by your health care provider. This is important. Contact a health care provider if:  You have abdominal pain that:  Gets worse.  Stays in one area (localizes).  You have a rash.  You have a stiff neck.  You are more irritable than usual.  You are sleepier or more difficult to wake up than usual.  You feel weak or dizzy.  You feel very thirsty.  You have urinated only a small amount of very dark urine over 6-8 hours. Get help right away if:  You have symptoms of severe dehydration.  You cannot drink fluids without vomiting.  Your symptoms get worse with treatment.  You have a fever.  You have a severe headache.  You have vomiting or diarrhea that:  Gets worse.  Does not go away.  You have blood or green matter (bile) in your vomit.  You have blood in your stool. This may cause stool to look black and tarry.  You have not  urinated in 6-8 hours.  You faint.  Your heart rate while sitting still is over 100 beats a minute.  You have trouble breathing. This information is not intended to replace advice given to you by your health care provider. Make sure you discuss any questions you have with your health care provider. Document Released: 03/29/2005 Document Revised: 10/24/2015 Document Reviewed: 05/23/2015 Elsevier Interactive Patient Education  2017 [employer].

## 2016-03-17 NOTE — Progress Notes (Signed)
Janifer Adie, RN discussed port-a-cath with pt. [name] made aware. Pt required 6 IV sticks today and verbalized understanding about potential need for a port. Pt verbalized concerns to me d/t to diabetes and difficulty healing.

## 2016-03-17 NOTE — Telephone Encounter (Signed)
Dental referral assigned to [facility] @ Dr Enrique Sack. Appointments scheduled per 03/17/16 los. A copy of the AVS report and appointment schedule was given to the patient, per 03/17/16 los.

## 2016-03-17 NOTE — Telephone Encounter (Signed)
Spoke with pt. She is aware of RB's recommendations. Nothing further was needed. 

## 2016-03-22 ENCOUNTER — Other Ambulatory Visit (HOSPITAL_COMMUNITY): Payer: Medicare Other | Admitting: Dentistry

## 2016-03-22 ENCOUNTER — Telehealth: Payer: Self-pay | Admitting: Medical Oncology

## 2016-03-22 ENCOUNTER — Other Ambulatory Visit: Payer: Self-pay | Admitting: Medical Oncology

## 2016-03-22 NOTE — Telephone Encounter (Signed)
She needs to see her pulmonologist.

## 2016-03-22 NOTE — Telephone Encounter (Signed)
Pt calling back as instructed if she was not better on antibiotics. She felt better for 2 days then started coughing up yellow sputum again. Note to [place].

## 2016-03-22 NOTE — Telephone Encounter (Signed)
Pt.notified

## 2016-03-23 ENCOUNTER — Telehealth (HOSPITAL_COMMUNITY): Payer: Self-pay | Admitting: Dentistry

## 2016-03-23 ENCOUNTER — Other Ambulatory Visit: Payer: Self-pay | Admitting: Internal Medicine

## 2016-03-23 ENCOUNTER — Other Ambulatory Visit (HOSPITAL_COMMUNITY): Payer: Medicare Other | Admitting: Dentistry

## 2016-03-23 NOTE — Telephone Encounter (Signed)
12/12 Patient called on day of Dental Consult appt. 03/23/16 and left msg. cancelling appt. due to illness.  Pt. stated she will call back at a later date to reschedule.  LRI/17

## 2016-03-25 ENCOUNTER — Other Ambulatory Visit: Payer: Self-pay | Admitting: Internal Medicine

## 2016-03-25 ENCOUNTER — Other Ambulatory Visit: Payer: Self-pay | Admitting: *Deleted

## 2016-03-25 MED ORDER — ALPRAZOLAM 1 MG PO TABS: ORAL_TABLET | ORAL | 0 refills | Status: DC

## 2016-03-25 NOTE — Telephone Encounter (Signed)
Patient requested and to be faxed to pharmacy.

## 2016-03-26 ENCOUNTER — Other Ambulatory Visit: Payer: Self-pay

## 2016-03-26 DIAGNOSIS — M25552 Pain in left hip: Secondary | ICD-10-CM

## 2016-03-26 DIAGNOSIS — G44229 Chronic tension-type headache, not intractable: Secondary | ICD-10-CM

## 2016-03-26 DIAGNOSIS — M51369 Other intervertebral disc degeneration, lumbar region without mention of lumbar back pain or lower extremity pain: Secondary | ICD-10-CM

## 2016-03-26 DIAGNOSIS — M5136 Other intervertebral disc degeneration, lumbar region: Secondary | ICD-10-CM

## 2016-03-26 DIAGNOSIS — G8929 Other chronic pain: Secondary | ICD-10-CM

## 2016-03-26 MED ORDER — HYDROCODONE-ACETAMINOPHEN 10-325 MG PO TABS: ORAL_TABLET | ORAL | 0 refills | Status: DC

## 2016-03-29 ENCOUNTER — Telehealth: Payer: Self-pay | Admitting: Emergency Medicine

## 2016-03-29 MED ORDER — LEVOFLOXACIN 750 MG PO TABS: 750.0000 mg | ORAL_TABLET | Freq: Every day | ORAL | 0 refills | Status: DC

## 2016-03-29 NOTE — Telephone Encounter (Signed)
RB  Please Advise-Sick Message  Pt. C/o of coughing with greenish phlegm, noticed some blood in it, c/o increase sob, she stated she had to bump her o2 up to 4L from 3L, has lots of congestion, wheezing, body chills, feels warm feels like she describes like she has a fever. Would like to see if something could be called in. She stated she is unable to come in.

## 2016-03-29 NOTE — Telephone Encounter (Signed)
Start levaquin '750mg'$  qd x 7 days pred taper > Take '40mg'$  daily for 3 days, then '30mg'$  daily for 3 days, then '20mg'$  daily for 3 days, then '10mg'$  daily for 3 days, then stop

## 2016-03-29 NOTE — Telephone Encounter (Signed)
Spoke with pt, aware of recs.  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   Spoke with RB, who states to call in levaquin and have pt discuss temporarily d/c'ing immunotherapy so she can take prednisone d/t her frequent exacerbations.   Spoke with pt to relay above, who agrees with these recs.  She sees Dr. [name] on Wednesday (12/20) and will discuss this with him at that time.   levaquin called in to preferred pharmacy.  Nothing further needed.

## 2016-03-31 ENCOUNTER — Encounter: Payer: Self-pay | Admitting: Internal Medicine

## 2016-03-31 ENCOUNTER — Ambulatory Visit (HOSPITAL_BASED_OUTPATIENT_CLINIC_OR_DEPARTMENT_OTHER): Payer: Medicare Other | Admitting: Internal Medicine

## 2016-03-31 ENCOUNTER — Ambulatory Visit: Payer: Medicare Other

## 2016-03-31 ENCOUNTER — Other Ambulatory Visit (HOSPITAL_BASED_OUTPATIENT_CLINIC_OR_DEPARTMENT_OTHER): Payer: Medicare Other

## 2016-03-31 ENCOUNTER — Telehealth: Payer: Self-pay | Admitting: Emergency Medicine

## 2016-03-31 VITALS — BP 179/55 | HR 103 | Temp 98.5°F | Resp 18 | Ht 66.0 in | Wt 167.1 lb

## 2016-03-31 DIAGNOSIS — Z5112 Encounter for antineoplastic immunotherapy: Secondary | ICD-10-CM

## 2016-03-31 DIAGNOSIS — J441 Chronic obstructive pulmonary disease with (acute) exacerbation: Secondary | ICD-10-CM

## 2016-03-31 DIAGNOSIS — C3411 Malignant neoplasm of upper lobe, right bronchus or lung: Secondary | ICD-10-CM

## 2016-03-31 DIAGNOSIS — I10 Essential (primary) hypertension: Secondary | ICD-10-CM | POA: Diagnosis not present

## 2016-03-31 LAB — COMPREHENSIVE METABOLIC PANEL
ALT: 14 U/L (ref 0–55)
AST: 21 U/L (ref 5–34)
Albumin: 3.9 g/dL (ref 3.5–5.0)
Alkaline Phosphatase: 128 U/L (ref 40–150)
Anion Gap: 14 mEq/L — ABNORMAL HIGH (ref 3–11)
BUN: 15.4 mg/dL (ref 7.0–26.0)
CALCIUM: 10.2 mg/dL (ref 8.4–10.4)
CHLORIDE: 102 meq/L (ref 98–109)
CO2: 25 mEq/L (ref 22–29)
CREATININE: 1.2 mg/dL — AB (ref 0.6–1.1)
EGFR: 46 mL/min/{1.73_m2} — ABNORMAL LOW (ref >90)
Glucose: 120 mg/dl (ref 70–140)
Potassium: 3.3 mEq/L — ABNORMAL LOW (ref 3.5–5.1)
Sodium: 141 mEq/L (ref 136–145)
TOTAL PROTEIN: 7.5 g/dL (ref 6.4–8.3)
Total Bilirubin: 0.49 mg/dL (ref 0.20–1.20)

## 2016-03-31 LAB — CBC WITH DIFFERENTIAL/PLATELET
BASO%: 0.3 % (ref 0.0–2.0)
Basophils Absolute: 0 10*3/uL (ref 0.0–0.1)
EOS%: 5.6 % (ref 0.0–7.0)
Eosinophils Absolute: 0.6 10*3/uL — ABNORMAL HIGH (ref 0.0–0.5)
HEMATOCRIT: 37.7 % (ref 34.8–46.6)
HEMOGLOBIN: 12.4 g/dL (ref 11.6–15.9)
LYMPH#: 1.6 10*3/uL (ref 0.9–3.3)
LYMPH%: 16.6 % (ref 14.0–49.7)
MCH: 30.2 pg (ref 25.1–34.0)
MCHC: 32.9 g/dL (ref 31.5–36.0)
MCV: 91.7 fL (ref 79.5–101.0)
MONO#: 1 10*3/uL — ABNORMAL HIGH (ref 0.1–0.9)
MONO%: 10.2 % (ref 0.0–14.0)
NEUT%: 67.3 % (ref 38.4–76.8)
NEUTROS ABS: 6.6 10*3/uL — AB (ref 1.5–6.5)
Platelets: 272 10*3/uL (ref 145–400)
RBC: 4.11 10*6/uL (ref 3.70–5.45)
RDW: 13.4 % (ref 11.2–14.5)
WBC: 9.8 10*3/uL (ref 3.9–10.3)

## 2016-03-31 NOTE — Telephone Encounter (Signed)
Spoke with [name]. She is OK to start the pred taper starting tomorrow 12/21.   Please send >> Take '40mg'$  daily for 3 days, then '30mg'$  daily for 3 days, then '20mg'$  daily for 3 days, then '10mg'$  daily for 3 days, then stop

## 2016-03-31 NOTE — Progress Notes (Signed)
[facility] [phone]   [phone]) [phone]  OFFICE PROGRESS NOTE  [name], MD [phone] [place] [address]  DIAGNOSIS: Recurrent non-small cell lung cancer initially diagnosed as a stage II a adenocarcinoma in December 2012 with no action would mutations.   PRIOR THERAPY: 1) Status post radiation therapy to the right upper lobe lung mass completed on 06/04/2011 under the care of Dr. Pablo Ledger.  2) Systemic chemotherapy with carboplatin for AUC of 5 and Alimta 500 mg/M2 giving every 3 weeks, status post 3 cycles, last dose was given on 07/21/2011.  3) Systemic chemotherapy with carboplatin for AUC of 5 and Alimta 500 MG/M2 every 3 weeks. First dose on 01/02/2015. Status post 2 cycles, last dose was given 01/30/2015. Her treatment is currently on hold secondary to intolerance.  CURRENT THERAPY: Immunotherapy with Nivolumab 240 M IV every 2 weeks status post 14 cycles. Her treatment will be on hold for now.  INTERVAL HISTORY: [name] 70 y.o. female came to the clinic today for follow-up visit. The patient has been complaining of increasing fatigue, cough and shortness of breath. She was given prescription for Levaquin recently by [name]. He is also interested to start her on prednisone but would like to wait until she is off treatment with immunotherapy. She has no nausea or vomiting. She has no fever or chills. She denied having any chest pain but continues to have the shortness of breath and cough productive of yellowish sputum with no hemoptysis. She has no weight loss or night sweats. She is here today for evaluation and discussion of her treatment options.  MEDICAL HISTORY: Past Medical History:  Diagnosis Date  . Acute posthemorrhagic anemia   . Acute sinusitis, unspecified   . Acute upper respiratory infections of unspecified site   . Adenocarcinoma, lung ([place])    upper left lobe  . Anemia in neoplastic disease   . Anemia,  unspecified   . Anginal pain ([place])   . Anxiety   . Anxiety state, unspecified   . Arthritis    "all over my body"  . Asthma   . Asymptomatic varicose veins   . Atherosclerosis of native arteries of the extremities, unspecified   . Atrial fibrillation ([place])   . Bronchial pneumonia    history  . 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 Chemotherapy adverse reaction 09/15/11   "makes me light headed and pass out; this is 3rd blood transfusion since going thru it"  . CHF (congestive heart failure) ([place])   . Chronic airway obstruction, not elsewhere classified   . Chronic fatigue 08/19/2015  . Chronic sinusitis   . Complication of anesthesia    slow to wake up  . COPD (chronic obstructive pulmonary disease) ([place])   . Coronary artery disease    Dr. Burt Knack had stress test done  . DDD (degenerative disc disease), lumbar   . Depressive disorder, not elsewhere classified   . Diseases of lips   . Disorder of bone and cartilage, unspecified   . Dizziness and giddiness   . DVT (deep venous thrombosis) ([place])   . Dysrhythmia    irregular  . Effects of radiation, unspecified   . Emphysema   . Encounter for antineoplastic immunotherapy 08/19/2015  . Fibromyalgia   . Functional urinary incontinence   . GERD (gastroesophageal reflux disease)   . Hard of hearing, right   . Heart murmur   . Herpes zoster without  mention of complication   . Hip pain, chronic 09/17/2014  . History of blood clots    "2 in my aorta"  . History of blood transfusion   . History of bronchitis   . Hx of radiation therapy 04/28/11 -06/08/11   right upper lobe-lung  . Hyperlipidemia   . Hyperpotassemia   . Hypertension   . Hypopotassemia   . Kidney infection    "related to chemo"  . Lumbago   . Malignant neoplasm of bronchus and lung, unspecified site   . Mental disorder   . Muscle weakness (generalized)   . Muscle weakness (generalized)   . Non-small cell lung cancer ([place]) 03/31/2011   Adenocarcinoma Rt upper  lobe mass  . Osteoarthrosis, unspecified whether generalized or localized, unspecified site   . Osteoporosis   . Other abnormal blood chemistry   . Other and unspecified hyperlipidemia   . PAD (peripheral artery disease) ([place])   . Pain in thoracic spine   . Peripheral vascular disease ([place])   . 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 Primary cancer of right upper lobe of lung ([place] [place]) 02/08/2011   Biopsy Rt upper lobe mass 03/31/11>>adenocarcinoma DIAGNOSIS: Stage IIB/IIIA non-small cell lung cancer consistent with adenocarcinoma diagnosed in December 2012.   PRIOR THERAPY:  1) Status post radiation therapy to the right upper lobe lung mass completed on 06/04/2011 under the care of Dr. Pablo Ledger.  2) Systemic chemotherapy with carboplatin for AUC of 5 and Alimta 500 mg/M2 giving every 3   . Reflux   . Reflux esophagitis   . Respiratory failure with hypoxia 01/02/2010  . Restless legs syndrome (RLS)   . Scoliosis   . Screening for thyroid disorder   . Shortness of breath    "anytime really"  . Sinus headache    "all the time"  . Spasm of muscle   . Syncope and collapse   . Type II or unspecified type diabetes mellitus with neurological manifestations, uncontrolled(250.62)   . Type II or unspecified type diabetes mellitus without mention of complication, not stated as uncontrolled   . Type II or unspecified type diabetes mellitus without mention of complication, not stated as uncontrolled   . Unspecified constipation   . Unspecified essential hypertension   . Unspecified hearing loss   . Unspecified hereditary and idiopathic peripheral neuropathy   . Unspecified vitamin D deficiency   . Urinary tract infection, site not specified     ALLERGIES:  is allergic to iohexol; other; anoro ellipta [umeclidinium-vilanterol]; codeine; and protonix [pantoprazole sodium].  MEDICATIONS:  Current Outpatient Prescriptions  Medication Sig Dispense Refill  . ALPRAZolam (XANAX) 1 MG tablet TAKE 1  TABLET BY MOUTH 3 TIMES A DAY AS NEEDED FOR ANXIETY 90 tablet 0  . aspirin 81 MG tablet Take 81 mg by mouth daily.    . cetirizine-pseudoephedrine (ZYRTEC-D) 5-120 MG tablet One twice daily to help dizziness (Patient taking differently: Take 1 tablet by mouth 2 (two) times daily as needed for allergies (dizzeness). ) 60 tablet 1  . citalopram (CELEXA) 20 MG tablet TAKE 1 TABLET BY MOUTH EVERY DAY TO HELP MOOD (Patient taking differently: TAKE '20mg'$  TABLET BY MOUTH EVERY DAY TO HELP MOOD) 90 tablet 1  . dextromethorphan-guaiFENesin (MUCINEX DM) 30-600 MG 12hr tablet Take 1 tablet by mouth 2 (two) times daily.    Marland Kitchen diltiazem (CARDIZEM CD) 240 MG 24 hr capsule TAKE 1 CAPSULE BY MOUTH EVERY DAY FOR BLOOD PRESSURE. (Patient taking differently: TAKE '240mg'$  CAPSULE BY MOUTH EVERY DAY  FOR BLOOD PRESSURE.) 30 capsule 1  . fluticasone (FLONASE) 50 MCG/ACT nasal spray INSTILL 2 SPRAYS INTO BOTH NOSTRILS  TWICE DAILY *NEEDS APPOINTMENT* (Patient taking differently: INSTILL 2 SPRAYS INTO BOTH NOSTRILS  TWICE DAILY as needed for allergies *NEEDS APPOINTMENT*) 16 g 5  . glucose blood (FREESTYLE LITE) test strip Use to test blood sugar three times daily. Dx: E11.9, E11.49 100 each 12  . HEMATINIC/FOLIC ACID 478-2 MG TABS TAKE 1 TABLET BY MOUTH EVERY DAY 30 each 2  . HYDROcodone-acetaminophen (NORCO) 10-325 MG tablet Take One tablet by mouth every 6 hours if needed for pain 120 tablet 0  . Ipratropium-Albuterol (COMBIVENT RESPIMAT) 20-100 MCG/ACT AERS respimat Inhale 2 puffs into the lungs 4 (four) times daily.     . Lancets (FREESTYLE) lancets Use to test blood sugar three times daily. Dx: E11.9, E11.49 100 each 12  . levofloxacin (LEVAQUIN) 750 MG tablet Take 1 tablet (750 mg total) by mouth daily. 7 tablet 0  . loratadine (CLARITIN) 10 MG tablet TAKE 1 TABLET BY MOUTH EVERY DAY (Patient taking differently: TAKE '10mg'$  TABLET BY MOUTH once daily as needed for allergies) 30 tablet 3  . metFORMIN (GLUCOPHAGE) 500 MG tablet  Take 500 mg by mouth daily as needed (high blood sugar- 160+). Take one tablet at night for blood sugar     . Multiple Vitamins-Minerals (CENTRUM SILVER PO) Take 1 tablet by mouth daily. Patient unsure of dose    . NON FORMULARY Place 3 L into the nose daily. Oxygen    . potassium chloride (K-DUR,KLOR-CON) 10 MEQ tablet TAKE 1 TABLET BY MOUTH EVERY DAY (Patient taking differently: TAKE 50mq TABLET BY MOUTH EVERY DAY) 30 tablet 1  . traMADol (ULTRAM) 50 MG tablet TAKE 1 TABLET BY MOUTH THREE TIMES A DAY AS NEEDED FOR PAIN (Patient taking differently: TAKE '50mg'$  TABLET BY MOUTH THREE TIMES A DAY AS NEEDED FOR PAIN) 90 tablet 4  . triamterene-hydrochlorothiazide (MAXZIDE-25) 37.5-25 MG tablet TAKE 1 TABLET BY MOUTH EVERY DAY TO CONTROL BLOOD PRESSURE 90 tablet 0  . Phenylephrine-DM-GG-APAP (DELSYM COUGH/COLD DAYTIME PO) Take by mouth.     No current facility-administered medications for this visit.    Facility-Administered Medications Ordered in Other Visits  Medication Dose Route Frequency Provider Last Rate Last Dose  . hyaluronate sodium (RADIAPLEXRX) gel   Topical Once SThea Silversmith MD      . topical emolient (BIAFINE) emulsion   Topical PRN SThea Silversmith MD        SURGICAL HISTORY:  Past Surgical History:  Procedure Laterality Date  . BRONCHOSCOPY  02/2011  . CARDIAC CATHETERIZATION    . CATARACT EXTRACTION  03/12/2013   Left Eye   . DILATION AND CURETTAGE OF UTERUS  1980's  . EYE SURGERY Left 2014   Cataract  . FETAL BLOOD TRANSFUSION  09/16/11  . JOINT REPLACEMENT  04/13/10   Left Hip  . LUNG LOBECTOMY  1986   left  . SEPTOPLASTY    . TOTAL HIP ARTHROPLASTY  01/2010   left  . TUBAL LIGATION  1980's    REVIEW OF SYSTEMS:  Constitutional: positive for fatigue Eyes: negative Ears, nose, mouth, throat, and face: negative Respiratory: positive for cough, dyspnea on exertion, sputum and wheezing Cardiovascular: negative Gastrointestinal:  negative Genitourinary:negative Integument/breast: negative Hematologic/lymphatic: negative Musculoskeletal:positive for muscle weakness Neurological: negative Behavioral/Psych: negative Endocrine: negative Allergic/Immunologic: negative   PHYSICAL EXAMINATION: General appearance: alert, cooperative, fatigued and mild distress Head: Normocephalic, without obvious abnormality, atraumatic Neck: no adenopathy, no JVD, supple,  symmetrical, trachea midline and thyroid not enlarged, symmetric, no tenderness/mass/nodules Lymph nodes: Cervical, supraclavicular, and axillary nodes normal. Resp: wheezes bilaterally Back: symmetric, no curvature. ROM normal. No CVA tenderness. Cardio: regular rate and rhythm, S1, S2 normal, no murmur, click, rub or gallop GI: soft, non-tender; bowel sounds normal; no masses,  no organomegaly Extremities: extremities normal, atraumatic, no cyanosis or edema  ECOG PERFORMANCE STATUS: 1 - Symptomatic but completely ambulatory  Blood pressure (!) 179/55, pulse (!) 103, temperature 98.5 F (36.9 C), temperature source Oral, resp. rate 18, height '5\' 6"'$  (1.676 m), weight 167 lb 1.6 oz (75.8 kg), SpO2 100 %.  LABORATORY DATA: Lab Results  Component Value Date   WBC 9.8 03/31/2016   HGB 12.4 03/31/2016   HCT 37.7 03/31/2016   MCV 91.7 03/31/2016   PLT 272 03/31/2016      Chemistry      Component Value Date/Time   NA 141 03/31/2016 1147   K 3.3 (L) 03/31/2016 1147   CL 95 (L) 03/08/2016 1717   CL 99 07/21/2012 1125   CO2 25 03/31/2016 1147   BUN 15.4 03/31/2016 1147   CREATININE 1.2 (H) 03/31/2016 1147      Component Value Date/Time   CALCIUM 10.2 03/31/2016 1147   ALKPHOS 128 03/31/2016 1147   AST 21 03/31/2016 1147   ALT 14 03/31/2016 1147   BILITOT 0.49 03/31/2016 1147       RADIOGRAPHIC STUDIES: Dg Chest 2 View  Result Date: 03/08/2016 CLINICAL DATA:  Worsening shortness of breath. EXAM: CHEST  2 VIEW COMPARISON:  Chest x-ray April 15, 2015 and multiple CT scans since October 27, 2015 FINDINGS: The opacity in the right mid lung, better characterized on previous CT imaging, is similar in appearance on today's study compared to the scout view from the March 02, 2016 CT scan. No other interval changes. No pneumothorax. IMPRESSION: Persistent opacity in the right upper lobe, similar on today's study when compared to the scout views from the September 25th in March 02, 2016 CT scans. Electronically Signed   By: Dorise Bullion III M.D   On: 03/08/2016 16:36   Ct Chest Wo Contrast  Result Date: 03/03/2016 CLINICAL DATA:  Lung cancer restaging. History of remote left-sided lung cancer with right lung cancer diagnosed 2012. EXAM: CT CHEST WITHOUT CONTRAST TECHNIQUE: Multidetector CT imaging of the chest was performed following the standard protocol without IV contrast. COMPARISON:  01/05/2016. FINDINGS: Cardiovascular: The heart size is normal. No pericardial effusion. Coronary artery calcification is noted. Atherosclerotic calcification is noted in the wall of the thoracic aorta. Mediastinum/Nodes: No mediastinal lymphadenopathy. No evidence for left axillary lymphadenopathy on this noncontrast exam. Right hilum largely obscured by parahilar post treatment fibrosis, similar appearance to prior. There is no axillary lymphadenopathy. The esophagus has normal imaging features. Lungs/Pleura: Volume loss left hemi thorax compatible with prior left upper lobectomy. Scarring posterior left lung is unchanged in the interval. Parahilar scarring and volume loss in the right hemi thorax is unchanged in the interval. The dominant large central right lung lesion, mainly representing ground-glass attenuation, measures 8.3 x 5.7 cm today compared to 8.5 x 5.5 cm previously, consistent with no substantial interval change. Slice acquisition is a slightly different scan plane today which makes reproducible measurement difficult. There are some scattered satellite  nodules, as before. The previously described ground-glass nodule inferior left lung is stable in the interval. Emphysema is evident with scattered areas of bronchial wall thickening. Upper Abdomen: Gallbladder incompletely visualized, but calcified stones  are noted measuring up to 9 mm diameter. Small left adrenal nodule measures low attenuation consistent with adenoma. Abdominal aorta incompletely visualized, but demonstrates atherosclerosis. Musculoskeletal: Bone windows reveal no worrisome lytic or sclerotic osseous lesions. 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 The patient is status post upper thoracic vertebral augmentation at T4. Stable appearance superior endplate compression deformity at T5. IMPRESSION: 1. No substantial interval change in appearance of the dominant irregular right lung ground-glass lesion. 2. Coronary artery and thoracoabdominal atherosclerosis. 3. Cholelithiasis. 4. Emphysema. Electronically Signed   By: Misty Stanley M.D.   On: 03/03/2016 10:13    ASSESSMENT AND PLAN: This is a very pleasant 70 years old white female with history of recurrent non-small cell lung cancer, adenocarcinoma status post several chemotherapy regimens and she is currently on treatment with immunotherapy with Nivolumab status post 14 cycles.  The patient has been tolerating her treatment well with no significant adverse effects and her recent imaging studies showed no clear evidence for disease progression. Unfortunately she has significant COPD with intermittent exacerbation. She has not been feeling well recently with increasing shortness of breath and cough productive of yellow sputum. She was started recently on treatment with Levaquin by [name] and he is also considering her for high-dose prednisone. I had a lengthy discussion with the patient today about her condition.  I recommended for her to hold her treatment with immunotherapy for now until improvement of her COPD as she  may require high-dose of steroids. I will arrange for the patient to come back for follow-up visit in 2 months with repeat CT scan of the chest for restaging of her disease. For COPD, she will continue with Levaquin for now and ask her to contact [name] for consideration of high-dose prednisone. For hypertension, I strongly encouraged the patient to take her blood pressure medication at regular basis and to reconsult with her primary care physician for adjustment of her doses. She was advised to call immediately if she has any concerning symptoms in the interval. The patient voices understanding of current disease status and treatment options and is in agreement with the current care plan.  All questions were answered. The patient knows to call the clinic with any problems, questions or concerns. We can certainly see the patient much sooner if necessary.  Disclaimer: This note was dictated with voice recognition software. Similar sounding words can inadvertently be transcribed and may not be corrected upon review.

## 2016-03-31 NOTE — Telephone Encounter (Signed)
Patient states that she needs the Prednisone for a URI that she believes she is developing. Shes been coughing for the past few days. Non productive cough.

## 2016-04-01 ENCOUNTER — Telehealth: Payer: Self-pay | Admitting: *Deleted

## 2016-04-01 MED ORDER — PREDNISONE 10 MG PO TABS: ORAL_TABLET | ORAL | 0 refills | Status: DC

## 2016-04-01 NOTE — Telephone Encounter (Signed)
Patient called and stated that ever since you took her off of her Cozaar she feels wobbley and off balance. Stated that she goes for Chemo. Infusions and they take her BP and it is high with the top # being in 170's. She wants to go back on the Cozaar. Please Advise.

## 2016-04-01 NOTE — Telephone Encounter (Signed)
Rx has been sent in per RB. Nothing further was needed.

## 2016-04-02 MED ORDER — LOSARTAN POTASSIUM 50 MG PO TABS: 50.0000 mg | ORAL_TABLET | Freq: Two times a day (BID) | ORAL | 1 refills | Status: DC

## 2016-04-02 NOTE — Telephone Encounter (Signed)
Resume Cozaar.

## 2016-04-02 NOTE — Telephone Encounter (Signed)
RX sent, patient aware (dose and instruction verified off current medication bottle)

## 2016-04-02 NOTE — Telephone Encounter (Signed)
Patient called back to get a status update on message. Please advise

## 2016-04-13 ENCOUNTER — Other Ambulatory Visit: Payer: Self-pay

## 2016-04-13 ENCOUNTER — Telehealth: Payer: Self-pay | Admitting: Emergency Medicine

## 2016-04-13 MED ORDER — FREESTYLE SYSTEM KIT: 1.0000 | PACK | 0 refills | Status: DC | PRN

## 2016-04-13 MED ORDER — FLUCONAZOLE 100 MG PO TABS: 100.0000 mg | ORAL_TABLET | Freq: Every day | ORAL | 0 refills | Status: DC

## 2016-04-13 NOTE — Telephone Encounter (Signed)
RB  Please Advise- Sick Message  Pt. Called in today, stating she finished her prednisone and her levaquin and she is still not better. She is still congested, she is coughing up white, with some hints of greenish phlegm, she is wheezing some. Denies fever. She wanted to know what should she do. Collene Gobble, MD      5:36 PM  Note    Spoke with Ms [place]. She is OK to start the pred taper starting tomorrow 12/21.   Please send >> Take '40mg'$  daily for 3 days, then '30mg'$  daily for 3 days, then '20mg'$  daily for 3 days, then '10mg'$  daily for 3 days, then stop         Allergies  Allergen Reactions  . Iohexol Shortness Of Breath and Other (See Comments)    "burns me up inside"    Pt does fine with 13 hour prep  01/17/12  . Other     CAN'T STAND THE SMELL OF PURE BLEACH; "HAVE TO BACK UP AND POUR AND DON'T BREATH IT TIL i GET CAP BACK ON; DILUTE IT TO SPRAY"  . Anoro Ellipta [Umeclidinium-Vilanterol] Swelling    Swelling of lips and mouth  . Codeine Itching and Nausea And Vomiting    REACTION: GI upset  . Protonix [Pantoprazole Sodium] Diarrhea

## 2016-04-13 NOTE — Telephone Encounter (Signed)
RB called back and he stated that fluconazole 100 mg  1 daily x 3 days.  This has been sent to the pharmacy and nothing further is needed.

## 2016-04-13 NOTE — Telephone Encounter (Signed)
Called and spoke with pt and she stated that she has appt with RB on 1/9 and she will keep this appt and wanted to see if we could call in something for thrush----please advise. We have no other openings until Thursday with MW and pt did not want to take that appt. Thanks  Allergies  Allergen Reactions  . Iohexol Shortness Of Breath and Other (See Comments)    "burns me up inside"    Pt does fine with 13 hour prep  01/17/12  . Other     CAN'T STAND THE SMELL OF PURE BLEACH; "HAVE TO BACK UP AND POUR AND DON'T BREATH IT TIL i GET CAP BACK ON; DILUTE IT TO SPRAY"  . Anoro Ellipta [Umeclidinium-Vilanterol] Swelling    Swelling of lips and mouth  . Codeine Itching and Nausea And Vomiting    REACTION: GI upset  . Protonix [Pantoprazole Sodium] Diarrhea

## 2016-04-13 NOTE — Telephone Encounter (Signed)
If no better after out pt treatment then she needs to be see as a work-in.

## 2016-04-14 ENCOUNTER — Ambulatory Visit: Payer: Medicare Other | Admitting: Nurse Practitioner

## 2016-04-14 ENCOUNTER — Other Ambulatory Visit: Payer: Medicare Other

## 2016-04-14 ENCOUNTER — Telehealth: Payer: Self-pay | Admitting: Internal Medicine

## 2016-04-14 ENCOUNTER — Ambulatory Visit: Payer: Medicare Other

## 2016-04-14 NOTE — Telephone Encounter (Signed)
I told pt that she will get a CT scan around 2/19

## 2016-04-14 NOTE — Telephone Encounter (Signed)
I spoke with [name] about confirming the February Appointments and she had questions about having lab so far from  Her last chemo treatment and also a scan she was wondering if she would have to have that done as well

## 2016-04-19 ENCOUNTER — Telehealth (HOSPITAL_COMMUNITY): Payer: Self-pay | Admitting: Dentistry

## 2016-04-19 ENCOUNTER — Other Ambulatory Visit: Payer: Self-pay | Admitting: Internal Medicine

## 2016-04-19 NOTE — Telephone Encounter (Signed)
04/19/16  Called patient and gave information to contact OS/Dr. Hoyt Koch for appt.  Patient stated she will call office and schedule appt.  LRI

## 2016-04-20 ENCOUNTER — Ambulatory Visit: Payer: Medicare Other | Admitting: Emergency Medicine

## 2016-04-21 ENCOUNTER — Other Ambulatory Visit: Payer: Self-pay | Admitting: *Deleted

## 2016-04-21 MED ORDER — ACCU-CHEK SOFTCLIX LANCETS MISC: 12 refills | Status: DC

## 2016-04-21 MED ORDER — ACCU-CHEK AVIVA PLUS W/DEVICE KIT: PACK | 0 refills | Status: AC

## 2016-04-21 MED ORDER — GLUCOSE BLOOD VI STRP: ORAL_STRIP | 12 refills | Status: DC

## 2016-04-21 MED ORDER — ACCU-CHEK SOFTCLIX LANCET DEV MISC: 0 refills | Status: DC

## 2016-04-21 NOTE — Telephone Encounter (Signed)
Patient requested another meter to be called into pharmacy because insurance will not cover FreeStyle. Instructed patient to call her insurance to see which one they will cover. Patient called back and stated that [employer] told her to just call in another one.

## 2016-04-23 ENCOUNTER — Other Ambulatory Visit: Payer: Self-pay

## 2016-04-23 DIAGNOSIS — G44229 Chronic tension-type headache, not intractable: Secondary | ICD-10-CM

## 2016-04-23 DIAGNOSIS — M51369 Other intervertebral disc degeneration, lumbar region without mention of lumbar back pain or lower extremity pain: Secondary | ICD-10-CM

## 2016-04-23 DIAGNOSIS — M5136 Other intervertebral disc degeneration, lumbar region: Secondary | ICD-10-CM

## 2016-04-23 DIAGNOSIS — G8929 Other chronic pain: Secondary | ICD-10-CM

## 2016-04-23 DIAGNOSIS — M25552 Pain in left hip: Secondary | ICD-10-CM

## 2016-04-23 MED ORDER — HYDROCODONE-ACETAMINOPHEN 10-325 MG PO TABS: ORAL_TABLET | ORAL | 0 refills | Status: DC

## 2016-04-23 NOTE — Addendum Note (Signed)
Addended by: Logan Bores on: 04/23/2016 08:43 AM   Modules accepted: Orders

## 2016-04-28 ENCOUNTER — Ambulatory Visit: Payer: Medicare Other | Admitting: Internal Medicine

## 2016-04-28 ENCOUNTER — Other Ambulatory Visit: Payer: Medicare Other

## 2016-04-28 ENCOUNTER — Ambulatory Visit: Payer: Medicare Other

## 2016-05-05 ENCOUNTER — Telehealth: Payer: Self-pay | Admitting: *Deleted

## 2016-05-05 NOTE — Telephone Encounter (Signed)
Pt called states " I am not on treatment, I am so weak I can barely do anything. It's been like this ever since I quit treatment. I have no energy" Pt advised she is not taking any folic acid as she ran out, she has to prepare to pack and move boxes and does have energy to continue trying to do this.  Pt last Carbo Alimta 12/6. Will review with MD. Returned call to pt per MD pt fatigue is like due to other illness in the body. Pt agreed and states "I figured that's what it was"  Encouraged pt to take her time, encouraged protein and hydration. Pt verbalized understanding.

## 2016-05-05 NOTE — Telephone Encounter (Signed)
Received fax from [facility] for patient's diabetic Meter. Filled out and given to Dr. Nyoka Cowden to review and sign. To be faxed to [phone]

## 2016-05-06 ENCOUNTER — Telehealth: Payer: Self-pay

## 2016-05-06 NOTE — Telephone Encounter (Signed)
Patient called requesting refill on DM test strips. RX was sent to the pharmacy on 04/21/16. Patient states rx never received.   I called the pharmacy to confirm rx received. I then called patient and informed her that rx was received and they will prepare for pick-up

## 2016-05-12 ENCOUNTER — Other Ambulatory Visit: Payer: Medicare Other

## 2016-05-12 ENCOUNTER — Ambulatory Visit: Payer: Medicare Other | Admitting: Family

## 2016-05-12 ENCOUNTER — Encounter (HOSPITAL_COMMUNITY): Payer: Medicare Other

## 2016-05-12 ENCOUNTER — Ambulatory Visit: Payer: Medicare Other | Admitting: Internal Medicine

## 2016-05-12 ENCOUNTER — Ambulatory Visit: Payer: Medicare Other

## 2016-05-13 ENCOUNTER — Ambulatory Visit: Payer: Self-pay | Admitting: Nurse Practitioner

## 2016-05-14 ENCOUNTER — Telehealth: Payer: Self-pay | Admitting: *Deleted

## 2016-05-14 NOTE — Telephone Encounter (Signed)
Received Personal Services Form (PCS) from [place] [phone] Form was already filled out when received. Placed in Dr. Rolly Salter folder to review and sign.

## 2016-05-21 ENCOUNTER — Other Ambulatory Visit: Payer: Self-pay | Admitting: *Deleted

## 2016-05-21 DIAGNOSIS — M25552 Pain in left hip: Secondary | ICD-10-CM

## 2016-05-21 DIAGNOSIS — G44229 Chronic tension-type headache, not intractable: Secondary | ICD-10-CM

## 2016-05-21 DIAGNOSIS — G8929 Other chronic pain: Secondary | ICD-10-CM

## 2016-05-21 DIAGNOSIS — M5136 Other intervertebral disc degeneration, lumbar region: Secondary | ICD-10-CM

## 2016-05-21 DIAGNOSIS — M51369 Other intervertebral disc degeneration, lumbar region without mention of lumbar back pain or lower extremity pain: Secondary | ICD-10-CM

## 2016-05-21 MED ORDER — HYDROCODONE-ACETAMINOPHEN 10-325 MG PO TABS: ORAL_TABLET | ORAL | 0 refills | Status: DC

## 2016-05-21 NOTE — Telephone Encounter (Signed)
Patient requested and will pick up 

## 2016-05-26 ENCOUNTER — Ambulatory Visit: Payer: Medicare Other | Admitting: Internal Medicine

## 2016-05-26 ENCOUNTER — Telehealth: Payer: Self-pay | Admitting: *Deleted

## 2016-05-26 ENCOUNTER — Ambulatory Visit: Payer: Medicare Other

## 2016-05-26 ENCOUNTER — Other Ambulatory Visit: Payer: Self-pay | Admitting: Medical Oncology

## 2016-05-26 ENCOUNTER — Encounter: Payer: Self-pay | Admitting: Internal Medicine

## 2016-05-26 ENCOUNTER — Other Ambulatory Visit: Payer: Medicare Other

## 2016-05-26 DIAGNOSIS — Z91041 Radiographic dye allergy status: Secondary | ICD-10-CM

## 2016-05-26 MED ORDER — PREDNISONE 50 MG PO TABS: ORAL_TABLET | ORAL | 1 refills | Status: DC

## 2016-05-26 MED ORDER — DIPHENHYDRAMINE HCL 25 MG PO CAPS: 25.0000 mg | ORAL_CAPSULE | ORAL | 0 refills | Status: DC

## 2016-05-26 NOTE — Telephone Encounter (Signed)
Received fax from [facility] for Diabetic Supplies. Test Strips, Lancets and Meter. Filled out form and left for Dr. Nyoka Cowden to review and sign. To be faxed back to [facility] Fax: [phone]

## 2016-05-27 ENCOUNTER — Telehealth: Payer: Self-pay | Admitting: Internal Medicine

## 2016-05-27 ENCOUNTER — Other Ambulatory Visit: Payer: Self-pay | Admitting: Medical Oncology

## 2016-05-27 DIAGNOSIS — C3411 Malignant neoplasm of upper lobe, right bronchus or lung: Secondary | ICD-10-CM

## 2016-05-27 NOTE — Telephone Encounter (Signed)
Patient's pharmacy, [facility] that used to send her medications to her apartment is not covered by her insurance anymore and would like to have it taken off her pharmacy list. Patient would like her medications sent to [facility] on [address] and [address]. Instead.

## 2016-05-28 ENCOUNTER — Telehealth: Payer: Self-pay | Admitting: Emergency Medicine

## 2016-05-28 MED ORDER — DOXYCYCLINE HYCLATE 100 MG PO TABS: 100.0000 mg | ORAL_TABLET | Freq: Two times a day (BID) | ORAL | 0 refills | Status: DC

## 2016-05-28 NOTE — Telephone Encounter (Signed)
Please treat her with doxycycline '100mg'$  bid x 7 days. \ pred taper >> Take '40mg'$  daily for 3 days, then '30mg'$  daily for 3 days, then '20mg'$  daily for 3 days, then '10mg'$  daily for 3 days, then stop

## 2016-05-28 NOTE — Telephone Encounter (Signed)
Spoke with pt. States that she is having a flare up. Reports chest congestion, cough, wheezing and head congestion. Cough is non productive. Denies chest tightness, SOB or fever/chills/sweats. Has been taking Mucinex DM and Claritin with no relief. Would like something called in.  RB - please advise. Thanks.

## 2016-05-28 NOTE — Telephone Encounter (Signed)
Pt aware of recommendations. Pt states she can not take prednisone, as she currently takes an infusion. Abx as been sent to preferred pharmacy. Nothing further needed.   Will route to RB as a fyi. Thanks.

## 2016-05-31 ENCOUNTER — Ambulatory Visit (HOSPITAL_COMMUNITY): Admission: RE | Admit: 2016-05-31 | Payer: Medicare Other | Source: Ambulatory Visit

## 2016-05-31 ENCOUNTER — Other Ambulatory Visit: Payer: Medicare Other

## 2016-05-31 ENCOUNTER — Other Ambulatory Visit: Payer: Self-pay | Admitting: Internal Medicine

## 2016-05-31 ENCOUNTER — Telehealth: Payer: Self-pay | Admitting: Internal Medicine

## 2016-06-02 ENCOUNTER — Other Ambulatory Visit: Payer: Self-pay | Admitting: Internal Medicine

## 2016-06-02 ENCOUNTER — Other Ambulatory Visit (HOSPITAL_BASED_OUTPATIENT_CLINIC_OR_DEPARTMENT_OTHER): Payer: Medicare Other

## 2016-06-02 ENCOUNTER — Encounter: Payer: Self-pay | Admitting: Internal Medicine

## 2016-06-02 ENCOUNTER — Other Ambulatory Visit: Payer: Self-pay | Admitting: Nurse Practitioner

## 2016-06-02 ENCOUNTER — Telehealth: Payer: Self-pay | Admitting: Internal Medicine

## 2016-06-02 ENCOUNTER — Other Ambulatory Visit: Payer: Self-pay | Admitting: Emergency Medicine

## 2016-06-02 ENCOUNTER — Ambulatory Visit (HOSPITAL_COMMUNITY)
Admission: RE | Admit: 2016-06-02 | Discharge: 2016-06-02 | Disposition: A | Discharge status: Home / Self Care | Payer: Medicare Other | Source: Ambulatory Visit | Attending: Internal Medicine | Admitting: Internal Medicine

## 2016-06-02 ENCOUNTER — Ambulatory Visit (HOSPITAL_BASED_OUTPATIENT_CLINIC_OR_DEPARTMENT_OTHER): Payer: Medicare Other | Admitting: Internal Medicine

## 2016-06-02 VITALS — BP 119/62 | HR 96 | Temp 98.4°F | Resp 18 | Ht 66.0 in | Wt 169.4 lb

## 2016-06-02 DIAGNOSIS — I2510 Atherosclerotic heart disease of native coronary artery without angina pectoris: Secondary | ICD-10-CM | POA: Diagnosis not present

## 2016-06-02 DIAGNOSIS — C3411 Malignant neoplasm of upper lobe, right bronchus or lung: Secondary | ICD-10-CM | POA: Diagnosis not present

## 2016-06-02 DIAGNOSIS — J439 Emphysema, unspecified: Secondary | ICD-10-CM | POA: Insufficient documentation

## 2016-06-02 DIAGNOSIS — I700 Atherosclerosis of aorta: Secondary | ICD-10-CM | POA: Diagnosis not present

## 2016-06-02 DIAGNOSIS — R0602 Shortness of breath: Secondary | ICD-10-CM

## 2016-06-02 DIAGNOSIS — K8020 Calculus of gallbladder without cholecystitis without obstruction: Secondary | ICD-10-CM | POA: Diagnosis not present

## 2016-06-02 DIAGNOSIS — R531 Weakness: Secondary | ICD-10-CM

## 2016-06-02 DIAGNOSIS — R05 Cough: Secondary | ICD-10-CM | POA: Diagnosis not present

## 2016-06-02 DIAGNOSIS — J449 Chronic obstructive pulmonary disease, unspecified: Secondary | ICD-10-CM

## 2016-06-02 DIAGNOSIS — C3490 Malignant neoplasm of unspecified part of unspecified bronchus or lung: Secondary | ICD-10-CM | POA: Diagnosis not present

## 2016-06-02 LAB — CBC WITH DIFFERENTIAL/PLATELET
BASO%: 0.4 % (ref 0.0–2.0)
Basophils Absolute: 0 10*3/uL (ref 0.0–0.1)
EOS%: 4.6 % (ref 0.0–7.0)
Eosinophils Absolute: 0.4 10*3/uL (ref 0.0–0.5)
HCT: 33.5 % — ABNORMAL LOW (ref 34.8–46.6)
HEMOGLOBIN: 11 g/dL — AB (ref 11.6–15.9)
LYMPH%: 18.9 % (ref 14.0–49.7)
MCH: 30.2 pg (ref 25.1–34.0)
MCHC: 32.8 g/dL (ref 31.5–36.0)
MCV: 92 fL (ref 79.5–101.0)
MONO#: 0.6 10*3/uL (ref 0.1–0.9)
MONO%: 7.5 % (ref 0.0–14.0)
NEUT%: 68.6 % (ref 38.4–76.8)
NEUTROS ABS: 5.4 10*3/uL (ref 1.5–6.5)
Platelets: 298 10*3/uL (ref 145–400)
RBC: 3.64 10*6/uL — AB (ref 3.70–5.45)
RDW: 13.3 % (ref 11.2–14.5)
WBC: 7.8 10*3/uL (ref 3.9–10.3)
lymph#: 1.5 10*3/uL (ref 0.9–3.3)

## 2016-06-02 LAB — COMPREHENSIVE METABOLIC PANEL
ALBUMIN: 3.4 g/dL — AB (ref 3.5–5.0)
ALT: 8 U/L (ref 0–55)
AST: 15 U/L (ref 5–34)
Alkaline Phosphatase: 108 U/L (ref 40–150)
Anion Gap: 12 mEq/L — ABNORMAL HIGH (ref 3–11)
BILIRUBIN TOTAL: 0.35 mg/dL (ref 0.20–1.20)
BUN: 17.3 mg/dL (ref 7.0–26.0)
CO2: 28 meq/L (ref 22–29)
CREATININE: 1.1 mg/dL (ref 0.6–1.1)
Calcium: 10 mg/dL (ref 8.4–10.4)
Chloride: 100 mEq/L (ref 98–109)
EGFR: 52 mL/min/{1.73_m2} — ABNORMAL LOW (ref >90)
GLUCOSE: 195 mg/dL — AB (ref 70–140)
Potassium: 3.4 mEq/L — ABNORMAL LOW (ref 3.5–5.1)
SODIUM: 140 meq/L (ref 136–145)
TOTAL PROTEIN: 6.3 g/dL — AB (ref 6.4–8.3)

## 2016-06-02 NOTE — Telephone Encounter (Signed)
Appointments scheduled per 2/21 LOS. Patient given AVS report and calendars with future scheduled appointments. Patient aware of CT scan appointment.

## 2016-06-02 NOTE — Progress Notes (Signed)
[place] [phone]   [phone]  OFFICE PROGRESS NOTE  [name], MD [phone] [place] [address]  DIAGNOSIS: Recurrent non-small cell lung cancer initially diagnosed as a stage II a adenocarcinoma in December 2012 with no action would mutations.   PRIOR THERAPY: 1) Status post radiation therapy to the right upper lobe lung mass completed on 06/04/2011 under the care of Dr. Pablo Ledger.  2) Systemic chemotherapy with carboplatin for AUC of 5 and Alimta 500 mg/M2 giving every 3 weeks, status post 3 cycles, last dose was given on 07/21/2011.  3) Systemic chemotherapy with carboplatin for AUC of 5 and Alimta 500 MG/M2 every 3 weeks. First dose on 01/02/2015. Status post 2 cycles, last dose was given 01/30/2015. Her treatment is currently on hold secondary to intolerance. 4) Immunotherapy with Nivolumab 240 M IV every 2 weeks status post 14 cycles.   CURRENT THERAPY: Observation.  INTERVAL HISTORY: [name] 71 y.o. female came to the clinic today for follow-up visit accompanied by her son. The patient continues to complain of shortness of breath and cough with occasional hemoptysis is mainly in the morning after few bouts of dry cough. She denied having any weight loss or night sweats. She has no fever or chills. She denied having any nausea, vomiting, diarrhea or constipation. She is prone observation for the last few months. She had repeat CT scan of the chest performed earlier today and she is here for evaluation and discussion of her scan results.  MEDICAL HISTORY: Past Medical History:  Diagnosis Date  . Acute posthemorrhagic anemia   . Acute sinusitis, unspecified   . Acute upper respiratory infections of unspecified site   . Adenocarcinoma, lung ([place])    upper left lobe  . Anemia in neoplastic disease   . Anemia, unspecified   . Anginal pain ([place])   . Anxiety   . Anxiety state, unspecified   . Arthritis    "all  over my body"  . Asthma   . Asymptomatic varicose veins   . Atherosclerosis of native arteries of the extremities, unspecified   . Atrial fibrillation ([place])   . Bronchial pneumonia    history  . 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 Chemotherapy adverse reaction 09/15/11   "makes me light headed and pass out; this is 3rd blood transfusion since going thru it"  . CHF (congestive heart failure) ([place])   . Chronic airway obstruction, not elsewhere classified   . Chronic fatigue 08/19/2015  . Chronic sinusitis   . Complication of anesthesia    slow to wake up  . COPD (chronic obstructive pulmonary disease) ([place])   . Coronary artery disease    Dr. Burt Knack had stress test done  . DDD (degenerative disc disease), lumbar   . Depressive disorder, not elsewhere classified   . Diseases of lips   . Disorder of bone and cartilage, unspecified   . Dizziness and giddiness   . DVT (deep venous thrombosis) ([place])   . Dysrhythmia    irregular  . Effects of radiation, unspecified   . Emphysema   . Encounter for antineoplastic immunotherapy 08/19/2015  . Fibromyalgia   . Functional urinary incontinence   . GERD (gastroesophageal reflux disease)   . Hard of hearing, right   . Heart murmur   . Herpes zoster without mention of complication   . Hip pain, chronic 09/17/2014  . History of blood clots    "  2 in my aorta"  . History of blood transfusion   . History of bronchitis   . Hx of radiation therapy 04/28/11 -06/08/11   right upper lobe-lung  . Hyperlipidemia   . Hyperpotassemia   . Hypertension   . Hypopotassemia   . Kidney infection    "related to chemo"  . Lumbago   . Malignant neoplasm of bronchus and lung, unspecified site   . Mental disorder   . Muscle weakness (generalized)   . Muscle weakness (generalized)   . Non-small cell lung cancer ([place]) 03/31/2011   Adenocarcinoma Rt upper lobe mass  . Osteoarthrosis, unspecified whether generalized or localized, unspecified site   .  Osteoporosis   . Other abnormal blood chemistry   . Other and unspecified hyperlipidemia   . PAD (peripheral artery disease) ([place])   . Pain in thoracic spine   . Peripheral vascular disease ([place])   . 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 Primary cancer of right upper lobe of lung ([place]) 02/08/2011   Biopsy Rt upper lobe mass 03/31/11>>adenocarcinoma DIAGNOSIS: Stage IIB/IIIA non-small cell lung cancer consistent with adenocarcinoma diagnosed in December 2012.   PRIOR THERAPY:  1) Status post radiation therapy to the right upper lobe lung mass completed on 06/04/2011 under the care of Dr. Pablo Ledger.  2) Systemic chemotherapy with carboplatin for AUC of 5 and Alimta 500 mg/M2 giving every 3   . Reflux   . Reflux esophagitis   . Respiratory failure with hypoxia 01/02/2010  . Restless legs syndrome (RLS)   . Scoliosis   . Screening for thyroid disorder   . Shortness of breath    "anytime really"  . Sinus headache    "all the time"  . Spasm of muscle   . Syncope and collapse   . Type II or unspecified type diabetes mellitus with neurological manifestations, uncontrolled(250.62)   . Type II or unspecified type diabetes mellitus without mention of complication, not stated as uncontrolled   . Type II or unspecified type diabetes mellitus without mention of complication, not stated as uncontrolled   . Unspecified constipation   . Unspecified essential hypertension   . Unspecified hearing loss   . Unspecified hereditary and idiopathic peripheral neuropathy   . Unspecified vitamin D deficiency   . Urinary tract infection, site not specified     ALLERGIES:  is allergic to iohexol; other; anoro ellipta [umeclidinium-vilanterol]; codeine; and protonix [pantoprazole sodium].  MEDICATIONS:  Current Outpatient Prescriptions  Medication Sig Dispense Refill  . ACCU-CHEK SOFTCLIX LANCETS lancets Use as instructed to check blood sugar three times daily. Dx: E11.9, E11.49 100 each 12  . ALPRAZolam  (XANAX) 1 MG tablet TAKE 1 TABLET BY MOUTH 3 TIMES A DAY AS NEEDED FOR ANXIETY 90 tablet 0  . aspirin 81 MG tablet Take 81 mg by mouth daily.    . Blood Glucose Monitoring Suppl (ACCU-CHEK AVIVA PLUS) w/Device KIT Use as directed to check blood sugar Dx: E11.9, E11.49 1 kit 0  . cetirizine-pseudoephedrine (ZYRTEC-D) 5-120 MG tablet One twice daily to help dizziness (Patient taking differently: Take 1 tablet by mouth 2 (two) times daily as needed for allergies (dizzeness). ) 60 tablet 1  . citalopram (CELEXA) 20 MG tablet TAKE 1 TABLET BY MOUTH EVERY DAY TO HELP MOOD (Patient taking differently: TAKE 58m TABLET BY MOUTH EVERY DAY TO HELP MOOD) 90 tablet 1  . dextromethorphan-guaiFENesin (MUCINEX DM) 30-600 MG 12hr tablet Take 1 tablet by mouth 2 (two) times daily.    .Marland Kitchendiltiazem (  CARDIZEM CD) 240 MG 24 hr capsule TAKE 1 CAPSULE BY MOUTH EVERY DAY FOR BLOOD PRESSURE. (Patient taking differently: TAKE 216m CAPSULE BY MOUTH EVERY DAY FOR BLOOD PRESSURE.) 30 capsule 1  . diphenhydrAMINE (BENADRYL) 25 mg capsule Take 1 capsule (25 mg total) by mouth as directed. Take 1 capsule 2 hours prior to CT scan 10 capsule 0  . doxycycline (VIBRA-TABS) 100 MG tablet Take 1 tablet (100 mg total) by mouth 2 (two) times daily. 14 tablet 0  . fluconazole (DIFLUCAN) 100 MG tablet Take 1 tablet (100 mg total) by mouth daily. 3 tablet 0  . fluticasone (FLONASE) 50 MCG/ACT nasal spray INSTILL 2 SPRAYS INTO BOTH NOSTRILS  TWICE DAILY *NEEDS APPOINTMENT* (Patient taking differently: INSTILL 2 SPRAYS INTO BOTH NOSTRILS  TWICE DAILY as needed for allergies *NEEDS APPOINTMENT*) 16 g 5  . glucose blood (ACCU-CHEK AVIVA PLUS) test strip Use as instructed to check blood sugar three times daily. Dx:E11.9, E11.49 100 each 12  . HEMATINIC/FOLIC ACID 3409-8MG TABS TAKE 1 TABLET BY MOUTH EVERY DAY 30 each 2  . HYDROcodone-acetaminophen (NORCO) 10-325 MG tablet Take One tablet by mouth every 6 hours if needed for pain 120 tablet 0  .  Ipratropium-Albuterol (COMBIVENT RESPIMAT) 20-100 MCG/ACT AERS respimat Inhale 2 puffs into the lungs 4 (four) times daily.     .Elmore GuiseDevices (ACCU-CHEK SOFTCLIX) lancets Use as instructed to check blood sugar three times daily. Dx: E11.9, E11.49 1 each 0  . levofloxacin (LEVAQUIN) 750 MG tablet Take 1 tablet (750 mg total) by mouth daily. 7 tablet 0  . loratadine (CLARITIN) 10 MG tablet TAKE 1 TABLET BY MOUTH EVERY DAY (Patient taking differently: TAKE 171mTABLET BY MOUTH once daily as needed for allergies) 30 tablet 3  . losartan (COZAAR) 50 MG tablet Take 1 tablet (50 mg total) by mouth 2 (two) times daily. 180 tablet 1  . metFORMIN (GLUCOPHAGE) 500 MG tablet Take 500 mg by mouth daily as needed (high blood sugar- 160+). Take one tablet at night for blood sugar     . Multiple Vitamins-Minerals (CENTRUM SILVER PO) Take 1 tablet by mouth daily. Patient unsure of dose    . NON FORMULARY Place 3 L into the nose daily. Oxygen    . Phenylephrine-DM-GG-APAP (DELSYM COUGH/COLD DAYTIME PO) Take by mouth.    . potassium chloride (K-DUR,KLOR-CON) 10 MEQ tablet TAKE 1 TABLET BY MOUTH EVERY DAY (Patient taking differently: TAKE 1079mTABLET BY MOUTH EVERY DAY) 30 tablet 1  . predniSONE (DELTASONE) 10 MG tablet Take 4 tablets for 3 days, 3 tablets for 3 days, 2 tablets for 3 days, 1 tablet for 3 days 30 tablet 0  . predniSONE (DELTASONE) 50 MG tablet Take 1 tablet 13 hours prior to scan and 1 tablet 2 hours prior to CT scan 2 tablet 1  . traMADol (ULTRAM) 50 MG tablet TAKE 1 TABLET BY MOUTH THREE TIMES A DAY AS NEEDED FOR PAIN (Patient taking differently: TAKE 56m14mBLET BY MOUTH THREE TIMES A DAY AS NEEDED FOR PAIN) 90 tablet 4  . triamterene-hydrochlorothiazide (MAXZIDE-25) 37.5-25 MG tablet TAKE 1 TABLET BY MOUTH EVERY DAY TO CONTROL BLOOD PRESSURE 90 tablet 0   No current facility-administered medications for this visit.    Facility-Administered Medications Ordered in Other Visits  Medication Dose  Route Frequency Provider Last Rate Last Dose  . hyaluronate sodium (RADIAPLEXRX) gel   Topical Once StacThea Silversmith      . topical emolient (BIAFINE) emulsion   Topical  PRN Thea Silversmith, MD        SURGICAL HISTORY:  Past Surgical History:  Procedure Laterality Date  . BRONCHOSCOPY  02/2011  . CARDIAC CATHETERIZATION    . CATARACT EXTRACTION  03/12/2013   Left Eye   . DILATION AND CURETTAGE OF UTERUS  1980's  . EYE SURGERY Left 2014   Cataract  . FETAL BLOOD TRANSFUSION  09/16/11  . JOINT REPLACEMENT  04/13/10   Left Hip  . LUNG LOBECTOMY  1986   left  . SEPTOPLASTY    . TOTAL HIP ARTHROPLASTY  01/2010   left  . TUBAL LIGATION  1980's    REVIEW OF SYSTEMS:  A comprehensive review of systems was negative except for: Constitutional: positive for fatigue Respiratory: positive for cough and dyspnea on exertion Musculoskeletal: positive for muscle weakness   PHYSICAL EXAMINATION: General appearance: alert, cooperative, fatigued and mild distress Head: Normocephalic, without obvious abnormality, atraumatic Neck: no adenopathy, no JVD, supple, symmetrical, trachea midline and thyroid not enlarged, symmetric, no tenderness/mass/nodules Lymph nodes: Cervical, supraclavicular, and axillary nodes normal. Resp: wheezes bilaterally Back: symmetric, no curvature. ROM normal. No CVA tenderness. Cardio: regular rate and rhythm, S1, S2 normal, no murmur, click, rub or gallop GI: soft, non-tender; bowel sounds normal; no masses,  no organomegaly Extremities: extremities normal, atraumatic, no cyanosis or edema  ECOG PERFORMANCE STATUS: 2 - Symptomatic, <50% confined to bed  Blood pressure 119/62, pulse 96, temperature 98.4 F (36.9 C), temperature source Oral, resp. rate 18, height 5' 6" (1.676 m), weight 169 lb 6.4 oz (76.8 kg), SpO2 96 %.  LABORATORY DATA: Lab Results  Component Value Date   WBC 7.8 06/02/2016   HGB 11.0 (L) 06/02/2016   HCT 33.5 (L) 06/02/2016   MCV 92.0  06/02/2016   PLT 298 06/02/2016      Chemistry      Component Value Date/Time   NA 141 03/31/2016 1147   K 3.3 (L) 03/31/2016 1147   CL 95 (L) 03/08/2016 1717   CL 99 07/21/2012 1125   CO2 25 03/31/2016 1147   BUN 15.4 03/31/2016 1147   CREATININE 1.2 (H) 03/31/2016 1147      Component Value Date/Time   CALCIUM 10.2 03/31/2016 1147   ALKPHOS 128 03/31/2016 1147   AST 21 03/31/2016 1147   ALT 14 03/31/2016 1147   BILITOT 0.49 03/31/2016 1147       RADIOGRAPHIC STUDIES: No results found.  ASSESSMENT AND PLAN:  This is a very pleasant 71 years old white female with recurrent non-small cell lung cancer, adenocarcinoma. She underwent several chemotherapy regimens and most recently treated with immunotherapy with Nivolumab status post 14 cycles. Her treatment with immunotherapy is current on hold secondary to frequent infection and COPD exacerbation requiring steroid treatment. She has repeat CT scan of the chest performed earlier today. The final report is still pending I personally reviewed the images and I didn't see any concerning findings for disease progression except for mild increase in the opacity in the right lung. I will call the patient had that is any concerning findings on the final report. I recommended for the patient to continue on observation with repeat CT scan of the chest in 3 months. For COPD, she will continue her routine follow-up visit and evaluation by [name]. She was advised to call if she has any concerning symptoms in the interval. The patient voices understanding of current disease status and treatment options and is in agreement with the current care plan.  All  questions were answered. The patient knows to call the clinic with any problems, questions or concerns. We can certainly see the patient much sooner if necessary. I spent 10 minutes counseling the patient face to face. The total time spent in the appointment was 15 minutes.  Disclaimer: This  note was dictated with voice recognition software. Similar sounding words can inadvertently be transcribed and may not be corrected upon review.

## 2016-06-04 ENCOUNTER — Telehealth: Payer: Self-pay | Admitting: Emergency Medicine

## 2016-06-04 MED ORDER — AMOXICILLIN-POT CLAVULANATE 875-125 MG PO TABS: 1.0000 | ORAL_TABLET | Freq: Two times a day (BID) | ORAL | 0 refills | Status: DC

## 2016-06-04 NOTE — Telephone Encounter (Signed)
Pt also had a ct done if we need to look at it.

## 2016-06-04 NOTE — Telephone Encounter (Signed)
Called and spoke with pt and she stated that she is taking her last doxycycline tonight and she is not much better.   She stated that she feels that Dr. [name] has given up on her and has cancelled her treatments for 3 more months.  I advised the pt that I am sure that Is not the case.  Pt requested that RB call something else stronger in and she also wanted him to review her CT scan to make sure she does not have bronchitis.  Pt stated that she is still coughing up sputum that is sometimes yellow, brown, black and red blood just depending on the day.  RB is off this afternoon.  MW please advise. thanks  Allergies  Allergen Reactions  . Iohexol Shortness Of Breath and Other (See Comments)    "burns me up inside"    Pt does fine with 13 hour prep  01/17/12  . Other     CAN'T STAND THE SMELL OF PURE BLEACH; "HAVE TO BACK UP AND POUR AND DON'T BREATH IT TIL i GET CAP BACK ON; DILUTE IT TO SPRAY"  . Anoro Ellipta [Umeclidinium-Vilanterol] Swelling    Swelling of lips and mouth  . Codeine Itching and Nausea And Vomiting    REACTION: GI upset  . Protonix [Pantoprazole Sodium] Diarrhea

## 2016-06-04 NOTE — Telephone Encounter (Signed)
Augmentin 875 mg take one pill twice daily  X 10 days - take at breakfast and supper with large glass of water.  It would help reduce the usual side effects (diarrhea and yeast infections) if you ate cultured yogurt at lunch.  

## 2016-06-04 NOTE — Telephone Encounter (Signed)
Pt aware of MW's recommendations. Rx sent to preferred pharmacy. Nothing further needed.

## 2016-06-07 ENCOUNTER — Telehealth: Payer: Self-pay | Admitting: Emergency Medicine

## 2016-06-07 MED ORDER — IPRATROPIUM-ALBUTEROL 20-100 MCG/ACT IN AERS: 2.0000 | INHALATION_SPRAY | Freq: Four times a day (QID) | RESPIRATORY_TRACT | 5 refills | Status: DC

## 2016-06-07 NOTE — Telephone Encounter (Signed)
Pt requesting refill on combivent- requests rx to be for 2 inhalers dispensed at a time.  Rx sent accordingly- advised pt that insurance may not cover her receiving 2 at a time, and that her pharmacy will make her aware if there's any difficulty filling it this way.  Pt expressed understanding.  Nothing further needed.

## 2016-06-07 NOTE — Telephone Encounter (Signed)
Attempted to contact pt, phone went straight to vm but vm has not been set up yet, will call back later

## 2016-06-09 ENCOUNTER — Ambulatory Visit: Payer: Medicare Other

## 2016-06-09 ENCOUNTER — Telehealth: Payer: Self-pay | Admitting: *Deleted

## 2016-06-09 ENCOUNTER — Ambulatory Visit (INDEPENDENT_AMBULATORY_CARE_PROVIDER_SITE_OTHER): Payer: Medicare Other | Admitting: Emergency Medicine

## 2016-06-09 ENCOUNTER — Ambulatory Visit: Payer: Medicare Other | Admitting: Internal Medicine

## 2016-06-09 ENCOUNTER — Other Ambulatory Visit: Payer: Medicare Other

## 2016-06-09 ENCOUNTER — Encounter: Payer: Self-pay | Admitting: Emergency Medicine

## 2016-06-09 DIAGNOSIS — J449 Chronic obstructive pulmonary disease, unspecified: Secondary | ICD-10-CM

## 2016-06-09 DIAGNOSIS — J320 Chronic maxillary sinusitis: Secondary | ICD-10-CM | POA: Diagnosis not present

## 2016-06-09 DIAGNOSIS — C3411 Malignant neoplasm of upper lobe, right bronchus or lung: Secondary | ICD-10-CM | POA: Diagnosis not present

## 2016-06-09 NOTE — Telephone Encounter (Signed)
OK 

## 2016-06-09 NOTE — Assessment & Plan Note (Signed)
Start using theraflu as directed for congestion and symptom control Restart loratadine (Claritin) '10mg'$  daily.  Use flonase nasal spray 2 sprays each nostril daily Try stopping mucinex for now. Complete your Augmentin antibiotic as ordered

## 2016-06-09 NOTE — Telephone Encounter (Signed)
Dr. Nyoka Cowden wrote Rx for [facility] Dx: Knee Pain and Lung Cancer.  Rx left up front for patient to pick up [facility] for patient that it was ready.

## 2016-06-09 NOTE — Progress Notes (Signed)
Subjective:    Patient ID: [name], female    DOB: [birth year], 71 y.o.   MRN: [id number] HPI History of Present Illness:   ROV 02/09/16 -- patient has a history of COPD as well as stage IIIA adenocarcinoma the right upper lobe. She has been managed on Nivolumab. She underwent an MRI brain 01/23/16 that did not show any evidence of metastatic disease.  We tried changing her to [facility], took it for 2 weeks but had more chest tightness, more dyspnea. Went back to [place], currently using 2 puffs bid. She is having some exertional SOB, difficulty with any activity, showering. Wears O2 at 4L/min usually. She has a lot of cough, produces sputum. She is on loratadine alternated w zyrtec-d, and fluticasone qd.   ROV 06/09/16 -- Patient has a history of stage IIIa adenocarcinoma of the right upper lobe and severe COPD. She has been treated with right upper lobe radiation therapy, chemotherapy in 2013 and then again in 2016, then with immunotherapy (Nivolumab), stopped . Most recent CT scan of the chest was done on 06/02/16 that I have personally reviewed. This shows an increase in size in her mid lung groundglass lesion associated with some scattered satellite groundglass nodules. She has been treated recently with doxycycline and then with augmentin, still taking it. Not sure that it has helped her. She has had nasal gtt, cough for the last 3-4 weeks. Sputum has been dark at times.  She is on combivent 4x a day. She is not currently on loratadine, she uses flonase daily. She is using mucinex DM qd.       Objective:   Physical Exam Vitals:   06/09/16 1613  BP: 132/62  BP Location: Left Arm  Cuff Size: Normal  Pulse: (!) 110  SpO2: 92%  Weight: 166 lb 12.8 oz (75.7 kg)  Height: '5\' 6"'$  (1.676 m)   Gen: Pleasant, elderly woman, in no distress,  normal affect  ENT: No lesions,  mouth clear,  oropharynx clear, no postnasal drip  Neck: No JVD, no TMG, no carotid bruits  Lungs: No use of  accessory muscles, distant, no wheeze  Cardiovascular: RRR, heart sounds normal, no murmur or gallops, no peripheral edema  Musculoskeletal: No deformities, no cyanosis or clubbing.   Neuro: alert, non focal  Skin: Warm, no lesions or rashes   CBC Latest Ref Rng & Units 06/02/2016 03/31/2016 03/17/2016  WBC 3.9 - 10.3 10e3/uL 7.8 9.8 10.0  Hemoglobin 11.6 - 15.9 g/dL 11.0(L) 12.4 12.0  Hematocrit 34.8 - 46.6 % 33.5(L) 37.7 36.5  Platelets 145 - 400 10e3/uL 298 272 409(H)   BMET    Component Value Date/Time   NA 140 06/02/2016 1336   K 3.4 (L) 06/02/2016 1336   CL 95 (L) 03/08/2016 1717   CL 99 07/21/2012 1125   CO2 28 06/02/2016 1336   GLUCOSE 195 (H) 06/02/2016 1336   GLUCOSE 184 (H) 07/21/2012 1125   BUN 17.3 06/02/2016 1336   CREATININE 1.1 06/02/2016 1336   CALCIUM 10.0 06/02/2016 1336   GFRNONAA 42 (L) 08/06/2015 1036   GFRAA 48 (L) 08/06/2015 1036    CT chest 10/27/15 COMPARISON:  08/12/2015  FINDINGS: Cardiovascular: Aortic and branch vessel atherosclerosis. Normal heart size with minimal anterior pericardial fluid or thickening, similar. Multivessel coronary artery atherosclerosis.  Mediastinum/Nodes: No mediastinal or definite hilar adenopathy, given limitations of unenhanced CT. Esophageal fluid level.  Lungs/Pleura: Small right-sided pleural effusion is similar. Right-sided pleural based calcification is adjacent to healing right rib  fractures and likely dystrophic. No left-sided pleural fluid.  Left upper lobectomy. Narrowing of left upper lobe bronchi secondary to volume loss, similar.  Moderate centrilobular emphysema. Right upper or middle lobe ground-glass opacity with septal thickening. This measures on the order of 8.4 x 5.9 cm today versus 8.8 x 5.2 cm previously. Slightly more well-defined, with greater ground-glass density today. 2.6 cm on sagittal image 37 in craniocaudal dimension. Similar.  A separate more inferior and lateral right  upper lobe pulmonary nodule measures 4 mm on image 75/series 5 and is new. Also sagittal image 31.  Posterior right upper lobe consolidation is likely treatment related and similar. Concurrent volume loss.  Left apical ground-glass nodule measures 6 mm on image 28/series 5 and is either new or more apparent today.  A left lower lobe sub solid pulmonary nodule is unchanged at 7 mm on image 62/series 5  3 mm right lower lobe pulmonary nodule is similar on image 95/series 5.  medial right middle lobe area of probable mucous plugging is similar on image 78/series 5.  Upper Abdomen: Mild hepatic steatosis. Gallstones on the order of 6 mm. Normal imaged portions of the spleen, stomach, pancreas, kidneys. Low-density bilateral adrenal nodules are similar and likely due to underlying adenomas. Abdominal aortic atherosclerosis.  Musculoskeletal: Postsurgical or posttraumatic right-sided rib defects. Prior vertebral augmentation at T4. T5 mild to moderate compression deformity is similar.  IMPRESSION: 1. The dominant right-sided ground-glass opacity measures similar in size but is slightly more well-defined today, with increased density within. 2. An adjacent satellite 4 mm nodule is new and suspicious for progressive disease. Similarly, a left apical ground-glass nodule is new or increased and suspicious for progressive disease. 3. No thoracic adenopathy. 4. Other pulmonary lesions are similar. 5. Similar volume loss within the posterior right upper lobe with prior left upper lobectomy. 6. Similar small right pleural effusion. 7. Hepatic steatosis. 8.  Coronary artery atherosclerosis. Aortic atherosclerosis. 9. Cholelithiasis. 10. Esophageal air fluid level suggests dysmotility or gastroesophageal reflux.      Assessment & Plan:  COPD (chronic obstructive pulmonary disease) ([place]) She is not wheezing on exam today. Her symptoms include significant sinus drainage and  also productive cough. Not clear to me that her COPD is flaring - more consistent with chronic rhinitis, associated cough. She is being treated for a possible bronchitis w augmentin. I am concerned that some of her productive cough could be related to progression of her ground glass mass and associated mucous production by adenoCA. Will try to treat her rhinitis aggressively as below. Defer prednisone. Continue combivent.   Sinusitis, chronic Start using theraflu as directed for congestion and symptom control Restart loratadine (Claritin) '10mg'$  daily.  Use flonase nasal spray 2 sprays each nostril daily Try stopping mucinex for now. Complete your Augmentin antibiotic as ordered   Primary cancer of right upper lobe of lung ([place]) CT chest from 06/02/16 shows progression / enlargement of a likely adenoCA. ? Whether this is contributing to her mucous load, certainly could. She wants to get back on immunotherapy. I agree with this after she finishes the Augmentin. Should be adequate to treat possible bronchitis (if she had it).   Baltazar Apo, MD, PhD 06/09/2016, 5:33 PM [facility] [phone] or if no answer [phone]

## 2016-06-09 NOTE — Assessment & Plan Note (Addendum)
She is not wheezing on exam today. Her symptoms include significant sinus drainage and also productive cough. Not clear to me that her COPD is flaring - more consistent with chronic rhinitis, associated cough. She is being treated for a possible bronchitis w augmentin. I am concerned that some of her productive cough could be related to progression of her ground glass mass and associated mucous production by adenoCA. Will try to treat her rhinitis aggressively as below. Defer prednisone. Continue combivent.

## 2016-06-09 NOTE — Telephone Encounter (Signed)
Patient called and stated that she wants a Rx written for a Lift Chair due to Hip and Knee Pain and her stage 4 lung cancer. Please Advise.

## 2016-06-09 NOTE — Patient Instructions (Addendum)
Start using theraflu as directed for congestion and symptom control Restart loratadine (Claritin) '10mg'$  daily.  Use flonase nasal spray 2 sprays each nostril daily Use combivent 2 puffs 4x a day.  Try stopping mucinex for now. Complete your Augmentin antibiotic as ordered  Your CT scan shows that your right lung abnormality is larger. We will review with Dr Julien Nordmann, may need to restart your immunotherapy once this illness clears.  Follow with Dr Lamonte Sakai in 1 month or next available.

## 2016-06-09 NOTE — Assessment & Plan Note (Signed)
CT chest from 06/02/16 shows progression / enlargement of a likely adenoCA. ? Whether this is contributing to her mucous load, certainly could. She wants to get back on immunotherapy. I agree with this after she finishes the Augmentin. Should be adequate to treat possible bronchitis (if she had it).

## 2016-06-10 ENCOUNTER — Telehealth: Payer: Self-pay | Admitting: Emergency Medicine

## 2016-06-10 ENCOUNTER — Telehealth: Payer: Self-pay

## 2016-06-10 MED ORDER — FLUCONAZOLE 100 MG PO TABS: ORAL_TABLET | ORAL | 1 refills | Status: DC

## 2016-06-10 NOTE — Telephone Encounter (Signed)
Spoke with patient, will fax Rx to [employer], Diflucan #3. This will be find.

## 2016-06-10 NOTE — Telephone Encounter (Signed)
Call in prescription for Diflucan 100 mg (3) One daily to treat yeast infection. 1 Refill.

## 2016-06-10 NOTE — Telephone Encounter (Signed)
Patient called requesting rx for diflucan. Patient states she is taking ABX x 1 month now and has yeast in her mouth, dry mouth and chapped lips.  Please advise

## 2016-06-10 NOTE — Telephone Encounter (Signed)
Spoke with pt. She had a question about whether she should continue Augmentin. Per RB's AVS instructions, she is continue it until it's completed. Pt is aware of this information. Nothing further was needed.

## 2016-06-14 ENCOUNTER — Telehealth: Payer: Self-pay | Admitting: Emergency Medicine

## 2016-06-14 NOTE — Telephone Encounter (Signed)
Spoke with pt. She is wanting RB to call her after he speaks to Dr. [name]. When she was here last, RB told her that he was going to speak to Dr. [name] about starting her cancer treatments back.  RB - please advise. Thanks.

## 2016-06-15 ENCOUNTER — Ambulatory Visit: Payer: Medicare Other

## 2016-06-17 ENCOUNTER — Telehealth: Payer: Self-pay | Admitting: Internal Medicine

## 2016-06-17 ENCOUNTER — Telehealth: Payer: Self-pay | Admitting: Medical Oncology

## 2016-06-17 NOTE — Telephone Encounter (Signed)
Please let her know that I have sent a message to Dr Julien Nordmann so we can discuss.

## 2016-06-17 NOTE — Telephone Encounter (Signed)
atc pt, no answer, no vm set up.  Wcb.

## 2016-06-17 NOTE — Telephone Encounter (Signed)
Message sent to Dr Julien Nordmann regarding patient concerns. Appointment cancelled and awaiting response from Dr Julien Nordmann. Patient advised. 06/17/16

## 2016-06-17 NOTE — Telephone Encounter (Signed)
Per Julien Nordmann , I left a message telling her "scan did not show anything different from what I discussed with her during the visit. Will be happy to start treatment again ,but toxicity and breathing is an issue ". I also told her she cannot take steroids and it may make her COPD worse.

## 2016-06-17 NOTE — Telephone Encounter (Signed)
Pt calling stating that Dr [name] nurse called her and said: "It's not Groweth?Hillery Hunter

## 2016-06-17 NOTE — Telephone Encounter (Signed)
Patient called to schedule a follow up appointment for Dr [name] next available. Patient refused to speak with Nurse and was persistent with scheduling 03/19 appointment.

## 2016-06-17 NOTE — Telephone Encounter (Signed)
Pt received a letter from Dr Lamonte Sakai that said 'your cancer has grown". I told her that [facility] reviewed scan with her at appt and the  change is small. She does not know why Dr Julien Nordmann put her on 3 month observation and wants to start treatment. She said Dr Lamonte Sakai was going to call [place]. At the end she requested transfer to [facility].

## 2016-06-18 ENCOUNTER — Telehealth: Payer: Self-pay | Admitting: Internal Medicine

## 2016-06-18 ENCOUNTER — Other Ambulatory Visit: Payer: Self-pay

## 2016-06-18 DIAGNOSIS — G8929 Other chronic pain: Secondary | ICD-10-CM

## 2016-06-18 DIAGNOSIS — M51369 Other intervertebral disc degeneration, lumbar region without mention of lumbar back pain or lower extremity pain: Secondary | ICD-10-CM

## 2016-06-18 DIAGNOSIS — Z5112 Encounter for antineoplastic immunotherapy: Secondary | ICD-10-CM

## 2016-06-18 DIAGNOSIS — C3411 Malignant neoplasm of upper lobe, right bronchus or lung: Secondary | ICD-10-CM

## 2016-06-18 DIAGNOSIS — R5383 Other fatigue: Secondary | ICD-10-CM

## 2016-06-18 DIAGNOSIS — M25552 Pain in left hip: Secondary | ICD-10-CM

## 2016-06-18 DIAGNOSIS — G44229 Chronic tension-type headache, not intractable: Secondary | ICD-10-CM

## 2016-06-18 DIAGNOSIS — M5136 Other intervertebral disc degeneration, lumbar region: Secondary | ICD-10-CM

## 2016-06-18 MED ORDER — HYDROCODONE-ACETAMINOPHEN 10-325 MG PO TABS: ORAL_TABLET | ORAL | 0 refills | Status: DC

## 2016-06-18 NOTE — Telephone Encounter (Signed)
Opened in error

## 2016-06-18 NOTE — Telephone Encounter (Signed)
Per conversation with patient on 06/17/16, an in basket message was sent to provider, regarding patient's  concerns about being told by her Pulmonologist that her cancer showed growth and she will need to start chemo again. .  I informed patient that I would need to transfer her to the desk nurse. Patient was upset and refused to be transferred due to her calls were not being returned by the desk nurse.  I tried reaching Dr Julien Nordmann, but he was in with a patient and I was unsuccessful at reaching his desk nurse by phone. I apologized to the patient for the inconvenience and assured her that either the desk nurse or Dr Julien Nordmann would return her call and address her concerns.   Upon receiving a response from provider to schedule patient in the next available slot, which was 06/29/16 I called patient with the appointment date/time. Prior to making the phone call I also spoke with provider expressing how upset the patient was and asked if he would be willing to speak her if she still expressed concerns regarding her health or the appointment time frame th which he agreed.   When I spoke with patient this morning regarding the new appointment date and making her aware that Dr Julien Nordmann was available to speak with her at this time to address her concerns, patient stated that she has switched providers, effective 06/17/16, however there is no appointment on schedule with any other [facility] provider.   I was able to convince patient to speak with provider to allow him to address her concerns. I called provider so that I may transfer patient's call per our earlier conversation, however provider is no longer willing to speak with patient due to recent messages regarding her wanting to switch to a new provider.

## 2016-06-18 NOTE — Telephone Encounter (Signed)
Diane,  I am overbooked for 1 month due to [facility] patients. I cannot work her in any sooner than 1 month. Please ask other MD

## 2016-06-18 NOTE — Addendum Note (Signed)
Addended by: Ardeen Garland on: 06/18/2016 10:49 AM   Modules accepted: Orders

## 2016-06-18 NOTE — Telephone Encounter (Signed)
Spoke with pt and made her aware of Dr. Agustina Caroli message. Pt understood and stated that she is no longer happy with [name] and is looking to switch to Dr. Alvy Bimler. She as of right now does not have a follow up appointment with the new provider. Pt will await to hear from [name]

## 2016-06-18 NOTE — Telephone Encounter (Addendum)
Per Julien Nordmann I told pt he will see her next Friday with labs and f/u .appt given . I instructed pt to bring her daughter and son with her. Schedule request sent.

## 2016-06-18 NOTE — Telephone Encounter (Signed)
Per dr Julien Nordmann, patient was scheduled for next available appointment on 06/29/16.

## 2016-06-22 NOTE — Telephone Encounter (Signed)
Discussed with Dr Julien Nordmann

## 2016-06-23 ENCOUNTER — Other Ambulatory Visit: Payer: Medicare Other

## 2016-06-23 ENCOUNTER — Ambulatory Visit: Payer: Medicare Other | Admitting: Internal Medicine

## 2016-06-23 ENCOUNTER — Ambulatory Visit: Payer: Medicare Other

## 2016-06-24 ENCOUNTER — Ambulatory Visit: Payer: Medicare Other

## 2016-06-25 ENCOUNTER — Ambulatory Visit (HOSPITAL_BASED_OUTPATIENT_CLINIC_OR_DEPARTMENT_OTHER): Payer: Medicare Other | Admitting: Internal Medicine

## 2016-06-25 ENCOUNTER — Encounter: Payer: Self-pay | Admitting: Internal Medicine

## 2016-06-25 ENCOUNTER — Other Ambulatory Visit (HOSPITAL_BASED_OUTPATIENT_CLINIC_OR_DEPARTMENT_OTHER): Payer: Medicare Other

## 2016-06-25 VITALS — BP 99/58 | HR 90 | Temp 98.6°F | Resp 18 | Ht 66.0 in | Wt 173.6 lb

## 2016-06-25 DIAGNOSIS — R5382 Chronic fatigue, unspecified: Secondary | ICD-10-CM

## 2016-06-25 DIAGNOSIS — C3411 Malignant neoplasm of upper lobe, right bronchus or lung: Secondary | ICD-10-CM

## 2016-06-25 DIAGNOSIS — J449 Chronic obstructive pulmonary disease, unspecified: Secondary | ICD-10-CM

## 2016-06-25 DIAGNOSIS — R5383 Other fatigue: Secondary | ICD-10-CM

## 2016-06-25 DIAGNOSIS — I10 Essential (primary) hypertension: Secondary | ICD-10-CM | POA: Diagnosis not present

## 2016-06-25 LAB — COMPREHENSIVE METABOLIC PANEL
ALBUMIN: 3.6 g/dL (ref 3.5–5.0)
ALT: 10 U/L (ref 0–55)
ANION GAP: 9 meq/L (ref 3–11)
AST: 16 U/L (ref 5–34)
Alkaline Phosphatase: 92 U/L (ref 40–150)
BUN: 15.5 mg/dL (ref 7.0–26.0)
CALCIUM: 9.9 mg/dL (ref 8.4–10.4)
CO2: 26 mEq/L (ref 22–29)
Chloride: 106 mEq/L (ref 98–109)
Creatinine: 1 mg/dL (ref 0.6–1.1)
EGFR: 54 mL/min/{1.73_m2} — ABNORMAL LOW (ref >90)
Glucose: 88 mg/dl (ref 70–140)
Potassium: 4.1 mEq/L (ref 3.5–5.1)
Sodium: 140 mEq/L (ref 136–145)
Total Bilirubin: 0.35 mg/dL (ref 0.20–1.20)
Total Protein: 6.5 g/dL (ref 6.4–8.3)

## 2016-06-25 LAB — CBC WITH DIFFERENTIAL/PLATELET
BASO%: 0.5 % (ref 0.0–2.0)
BASOS ABS: 0 10*3/uL (ref 0.0–0.1)
EOS%: 7.4 % — AB (ref 0.0–7.0)
Eosinophils Absolute: 0.6 10*3/uL — ABNORMAL HIGH (ref 0.0–0.5)
HEMATOCRIT: 36.6 % (ref 34.8–46.6)
HEMOGLOBIN: 11.7 g/dL (ref 11.6–15.9)
LYMPH#: 2.4 10*3/uL (ref 0.9–3.3)
LYMPH%: 28.1 % (ref 14.0–49.7)
MCH: 29.9 pg (ref 25.1–34.0)
MCHC: 32 g/dL (ref 31.5–36.0)
MCV: 93.6 fL (ref 79.5–101.0)
MONO#: 0.8 10*3/uL (ref 0.1–0.9)
MONO%: 8.8 % (ref 0.0–14.0)
NEUT#: 4.8 10*3/uL (ref 1.5–6.5)
NEUT%: 55.2 % (ref 38.4–76.8)
PLATELETS: 277 10*3/uL (ref 145–400)
RBC: 3.91 10*6/uL (ref 3.70–5.45)
RDW: 13.3 % (ref 11.2–14.5)
WBC: 8.7 10*3/uL (ref 3.9–10.3)

## 2016-06-25 LAB — TSH: TSH: 2.384 m(IU)/L (ref 0.308–3.960)

## 2016-06-25 NOTE — Progress Notes (Signed)
[place] Telephone:([phone]) [phone]   Fax:(336) [phone]  OFFICE PROGRESS NOTE  [name], MD [id number] [address]  DIAGNOSIS: Recurrent non-small cell lung cancer initially diagnosed as a stage IIA adenocarcinoma in December 2012 with no actionable mutations.   PRIOR THERAPY: 1) Status post radiation therapy to the right upper lobe lung mass completed on 06/04/2011 under the care of Dr. Pablo Ledger.  2) Systemic chemotherapy with carboplatin for AUC of 5 and Alimta 500 mg/M2 giving every 3 weeks, status post 3 cycles, last dose was given on 07/21/2011.  3) Systemic chemotherapy with carboplatin for AUC of 5 and Alimta 500 MG/M2 every 3 weeks. First dose on 01/02/2015. Status post 2 cycles, last dose was given 01/30/2015. Her treatment is currently on hold secondary to intolerance. 4) Immunotherapy with Nivolumab 240 M IV every 2 weeks status post 14 cycles.   CURRENT THERAPY: Observation.  INTERVAL HISTORY: [name] 71 y.o. female came to the clinic today for follow-up visit accompanied by her daughter and son. The patient is currently on observation and feeling fine except for the baseline shortness of breath increased with exertion secondary to COPD. She just returned recently from her pulmonologist with the results of the CT scan of the chest that I previously ordered indicating that she had disease progression and she needed to start treatment as soon as possible. The patient was concerned and requested to be evaluated for discussion of this option. She denied having any chest pain but continues to have mild cough. She has no hemoptysis. She denied having any fever or chills. She has no nausea, vomiting, diarrhea or constipation. She denied having any weight loss or night sweats.   MEDICAL HISTORY: Past Medical History:  Diagnosis Date  . Acute posthemorrhagic anemia   . Acute sinusitis, unspecified   . Acute upper respiratory infections  of unspecified site   . Adenocarcinoma, lung ([place])    upper left lobe  . Anemia in neoplastic disease   . Anemia, unspecified   . Anginal pain ([place])   . Anxiety   . Anxiety state, unspecified   . Arthritis    "all over my body"  . Asthma   . Asymptomatic varicose veins   . Atherosclerosis of native arteries of the extremities, unspecified   . Atrial fibrillation ([place])   . Bronchial pneumonia    history  . 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 Chemotherapy adverse reaction 09/15/11   "makes me light headed and pass out; this is 3rd blood transfusion since going thru it"  . CHF (congestive heart failure) ([place])   . Chronic airway obstruction, not elsewhere classified   . Chronic fatigue 08/19/2015  . Chronic sinusitis   . Complication of anesthesia    slow to wake up  . COPD (chronic obstructive pulmonary disease) ([place])   . Coronary artery disease    Dr. Burt Knack had stress test done  . DDD (degenerative disc disease), lumbar   . Depressive disorder, not elsewhere classified   . Diseases of lips   . Disorder of bone and cartilage, unspecified   . Dizziness and giddiness   . DVT (deep venous thrombosis) ([place])   . Dysrhythmia    irregular  . Effects of radiation, unspecified   . Emphysema   . Encounter for antineoplastic immunotherapy 08/19/2015  . Fibromyalgia   . Functional urinary incontinence   . GERD (gastroesophageal reflux disease)   . Hard  of hearing, right   . Heart murmur   . Herpes zoster without mention of complication   . Hip pain, chronic 09/17/2014  . History of blood clots    "2 in my aorta"  . History of blood transfusion   . History of bronchitis   . Hx of radiation therapy 04/28/11 -06/08/11   right upper lobe-lung  . Hyperlipidemia   . Hyperpotassemia   . Hypertension   . Hypopotassemia   . Kidney infection    "related to chemo"  . Lumbago   . Malignant neoplasm of bronchus and lung, unspecified site   . Mental disorder   . Muscle weakness  (generalized)   . Muscle weakness (generalized)   . Non-small cell lung cancer ([place]) 03/31/2011   Adenocarcinoma Rt upper lobe mass  . Osteoarthrosis, unspecified whether generalized or localized, unspecified site   . Osteoporosis   . Other abnormal blood chemistry   . Other and unspecified hyperlipidemia   . PAD (peripheral artery disease) ([place])   . Pain in thoracic spine   . Peripheral vascular disease ([place])   . 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 Primary cancer of right upper lobe of lung ([place]) 02/08/2011   Biopsy Rt upper lobe mass 03/31/11>>adenocarcinoma DIAGNOSIS: Stage IIB/IIIA non-small cell lung cancer consistent with adenocarcinoma diagnosed in December 2012.   PRIOR THERAPY:  1) Status post radiation therapy to the right upper lobe lung mass completed on 06/04/2011 under the care of Dr. Pablo Ledger.  2) Systemic chemotherapy with carboplatin for AUC of 5 and Alimta 500 mg/M2 giving every 3   . Reflux   . Reflux esophagitis   . Respiratory failure with hypoxia 01/02/2010  . Restless legs syndrome (RLS)   . Scoliosis   . Screening for thyroid disorder   . Shortness of breath    "anytime really"  . Sinus headache    "all the time"  . Spasm of muscle   . Syncope and collapse   . Type II or unspecified type diabetes mellitus with neurological manifestations, uncontrolled(250.62)   . Type II or unspecified type diabetes mellitus without mention of complication, not stated as uncontrolled   . Type II or unspecified type diabetes mellitus without mention of complication, not stated as uncontrolled   . Unspecified constipation   . Unspecified essential hypertension   . Unspecified hearing loss   . Unspecified hereditary and idiopathic peripheral neuropathy   . Unspecified vitamin D deficiency   . Urinary tract infection, site not specified     ALLERGIES:  is allergic to iohexol; other; anoro ellipta [umeclidinium-vilanterol]; codeine; and protonix [pantoprazole  sodium].  MEDICATIONS:  Current Outpatient Prescriptions  Medication Sig Dispense Refill  . ACCU-CHEK SOFTCLIX LANCETS lancets Use as instructed to check blood sugar three times daily. Dx: E11.9, E11.49 100 each 12  . ALPRAZolam (XANAX) 1 MG tablet TAKE 1 TABLET BY MOUTH THREE TIMES A DAY AS NEEDED FOR ANXIETY 90 tablet 0  . amoxicillin-clavulanate (AUGMENTIN) 875-125 MG tablet Take 1 tablet by mouth 2 (two) times daily. 20 tablet 0  . aspirin 81 MG tablet Take 81 mg by mouth daily.    . Blood Glucose Monitoring Suppl (ACCU-CHEK AVIVA PLUS) w/Device KIT Use as directed to check blood sugar Dx: E11.9, E11.49 1 kit 0  . cetirizine-pseudoephedrine (ZYRTEC-D) 5-120 MG tablet One twice daily to help dizziness (Patient taking differently: Take 1 tablet by mouth 2 (two) times daily as needed for allergies (dizzeness). ) 60 tablet 1  . citalopram (CELEXA)  20 MG tablet TAKE 1 TABLET BY MOUTH EVERY DAY TO HELP MOOD (Patient taking differently: TAKE 39m TABLET BY MOUTH EVERY DAY TO HELP MOOD) 90 tablet 1  . dextromethorphan-guaiFENesin (MUCINEX DM) 30-600 MG 12hr tablet Take 1 tablet by mouth 2 (two) times daily.    .Marland Kitchendiltiazem (CARDIZEM CD) 240 MG 24 hr capsule TAKE 1 CAPSULE BY MOUTH EVERY DAY FOR BLOOD PRESSURE. 30 capsule 1  . fluconazole (DIFLUCAN) 100 MG tablet Take one tablet daily 3 tablet 1  . fluticasone (FLONASE) 50 MCG/ACT nasal spray INSTILL 2 SPRAYS IN EACH NOSTRIL TWICE DAILY *NEEDS APPOINTMENT* 16 g 5  . glucose blood (ACCU-CHEK AVIVA PLUS) test strip Use as instructed to check blood sugar three times daily. Dx:E11.9, E11.49 100 each 12  . HEMATINIC/FOLIC ACID 3570-1MG TABS TAKE 1 TABLET BY MOUTH EVERY DAY 30 each 2  . HYDROcodone-acetaminophen (NORCO) 10-325 MG tablet Take One tablet by mouth every 6 hours if needed for pain 120 tablet 0  . Ipratropium-Albuterol (COMBIVENT RESPIMAT) 20-100 MCG/ACT AERS respimat Inhale 2 puffs into the lungs 4 (four) times daily. 2 Inhaler 5  . Lancet  Devices (ACCU-CHEK SOFTCLIX) lancets Use as instructed to check blood sugar three times daily. Dx: E11.9, E11.49 1 each 0  . loratadine (CLARITIN) 10 MG tablet TAKE 1 TABLET BY MOUTH EVERY DAY (Patient taking differently: TAKE 130mTABLET BY MOUTH once daily as needed for allergies) 30 tablet 3  . loratadine (CLARITIN) 10 MG tablet TAKE 1 TABLET BY MOUTH EVERY DAY 30 tablet 5  . losartan (COZAAR) 50 MG tablet Take 1 tablet (50 mg total) by mouth 2 (two) times daily. 180 tablet 1  . metFORMIN (GLUCOPHAGE) 500 MG tablet Take 500 mg by mouth daily as needed (high blood sugar- 160+). Take one tablet at night for blood sugar     . Multiple Vitamins-Minerals (CENTRUM SILVER PO) Take 1 tablet by mouth daily. Patient unsure of dose    . NON FORMULARY Place 3 L into the nose daily. Oxygen    . Phenylephrine-DM-GG-APAP (DELSYM COUGH/COLD DAYTIME PO) Take by mouth.    . potassium chloride (K-DUR,KLOR-CON) 10 MEQ tablet TAKE 1 TABLET BY MOUTH EVERY DAY 30 tablet 1  . ranitidine (ZANTAC) 300 MG tablet TAKE 1 TABLET BY MOUTH EVERY NIGHT AT BEDTIME 30 tablet 3  . traMADol (ULTRAM) 50 MG tablet TAKE 1 TABLET BY MOUTH THREE TIMES A DAY AS NEEDED FOR PAIN 90 tablet 3  . triamterene-hydrochlorothiazide (MAXZIDE-25) 37.5-25 MG tablet TAKE 1 TABLET BY MOUTH EVERY DAY TO CONTROL BLOOD PRESSURE 90 tablet 0   No current facility-administered medications for this visit.    Facility-Administered Medications Ordered in Other Visits  Medication Dose Route Frequency Provider Last Rate Last Dose  . hyaluronate sodium (RADIAPLEXRX) gel   Topical Once StThea SilversmithMD      . topical emolient (BIAFINE) emulsion   Topical PRN StThea SilversmithMD        SURGICAL HISTORY:  Past Surgical History:  Procedure Laterality Date  . BRONCHOSCOPY  02/2011  . CARDIAC CATHETERIZATION    . CATARACT EXTRACTION  03/12/2013   Left Eye   . DILATION AND CURETTAGE OF UTERUS  1980's  . EYE SURGERY Left 2014   Cataract  . FETAL BLOOD  TRANSFUSION  09/16/11  . JOINT REPLACEMENT  04/13/10   Left Hip  . LUNG LOBECTOMY  1986   left  . SEPTOPLASTY    . TOTAL HIP ARTHROPLASTY  01/2010   left  .  TUBAL LIGATION  1980's    REVIEW OF SYSTEMS:  Constitutional: positive for fatigue Eyes: negative Ears, nose, mouth, throat, and face: negative Respiratory: positive for cough and dyspnea on exertion Cardiovascular: negative Gastrointestinal: negative Genitourinary:negative Integument/breast: negative Hematologic/lymphatic: negative Musculoskeletal:negative Neurological: negative Behavioral/Psych: negative Endocrine: negative Allergic/Immunologic: negative   PHYSICAL EXAMINATION: General appearance: alert, cooperative, fatigued and mild distress Head: Normocephalic, without obvious abnormality, atraumatic Neck: no adenopathy, no JVD, supple, symmetrical, trachea midline and thyroid not enlarged, symmetric, no tenderness/mass/nodules Lymph nodes: Cervical, supraclavicular, and axillary nodes normal. Resp: wheezes bilaterally Back: symmetric, no curvature. ROM normal. No CVA tenderness. Cardio: regular rate and rhythm, S1, S2 normal, no murmur, click, rub or gallop GI: soft, non-tender; bowel sounds normal; no masses,  no organomegaly Extremities: extremities normal, atraumatic, no cyanosis or edema Neurologic: Alert and oriented X 3, normal strength and tone. Normal symmetric reflexes. Normal coordination and gait  ECOG PERFORMANCE STATUS: 2 - Symptomatic, <50% confined to bed  There were no vitals taken for this visit.  LABORATORY DATA: Lab Results  Component Value Date   WBC 7.8 06/02/2016   HGB 11.0 (L) 06/02/2016   HCT 33.5 (L) 06/02/2016   MCV 92.0 06/02/2016   PLT 298 06/02/2016      Chemistry      Component Value Date/Time   NA 140 06/02/2016 1336   K 3.4 (L) 06/02/2016 1336   CL 95 (L) 03/08/2016 1717   CL 99 07/21/2012 1125   CO2 28 06/02/2016 1336   BUN 17.3 06/02/2016 1336   CREATININE 1.1  06/02/2016 1336      Component Value Date/Time   CALCIUM 10.0 06/02/2016 1336   ALKPHOS 108 06/02/2016 1336   AST 15 06/02/2016 1336   ALT 8 06/02/2016 1336   BILITOT 0.35 06/02/2016 1336       RADIOGRAPHIC STUDIES: Ct Chest Wo Contrast  Result Date: 06/02/2016 CLINICAL DATA:  Lung cancer followup. EXAM: CT CHEST WITHOUT CONTRAST TECHNIQUE: Multidetector CT imaging of the chest was performed following the standard protocol without IV contrast. COMPARISON:  03/02/2016 FINDINGS: Cardiovascular: The heart size is normal. No pericardial effusion. Aortic atherosclerosis identified. Calcifications within the RCA and LAD coronary artery noted. Mediastinum/Nodes: The trachea appears patent and is midline. Normal appearance of the esophagus. No mediastinal or hilar adenopathy. Lungs/Pleura: The chronic, small right pleural effusion is increased in volume from previous exam. Moderate changes of centrilobular emphysema. Again noted is perihilar fibrosis, consolidation and volume loss compatible with changes of external beam radiation. Large ground-glass attenuating lesion within the right midlung is again identified. This measures 8.7 x 5.6 cm, image 59 of series 7. Previously this measured 8.3 x 5.6 cm. Scattered satellite ground-glass attenuating nodules within the adjacent right lung are again identified and appear more conspicuous on today's study, image 64 of series 7. Pleural-parenchymal scarring along the oblique fissure of the left lung is unchanged, image 47 of series 7. Upper Abdomen: Calcified stones within the gallbladder are again noted. Low density nodule in the left adrenal gland is unchanged and likely represents a benign adenoma, image 142 of series 2. Similarly there is a low-attenuation nodule in the right adrenal gland is stable from previous exam, image 136 of series 2. 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 Chronic healed fracture deformities involving  the right posterior ribs noted. Stable compression deformities involving the upper thoracic spine. Prior vertebral augmentation at T4 noted. IMPRESSION: 1. The dominant ground-glass attenuating lesion within the right lung demonstrates mild  increase in size from previous exam. 2. Emphysema 3. Aortic atherosclerosis and coronary artery calcifications 4. Gallstones Electronically Signed   By: Kerby Moors M.D.   On: 06/02/2016 15:30    ASSESSMENT AND PLAN:  This is a very pleasant 71 years old white female with recurrent non-small cell lung cancer, adenocarcinoma that was initially diagnosed as unresectable a stage II a in December 2012. She is status post radiotherapy followed by 3 cycles of systemic chemotherapy with carboplatin and Alimta followed by observation for more than 3 years and retreatment again when she has disease recurrence with 2 cycles of carboplatin and Alimta discontinued secondary to intolerance. The patient was started on treatment with immunotherapy with Nivolumab every 2 weeks is status post 14 cycles discontinued secondary to 2 worsening of her pulmonary function and COPD exacerbation requiring steroid treatment. Repeat CT scan of the chest after 3 months of observation showed no clear evidence for disease progression except for mild increased by a few millimeters compared to the previous imaging studies. I had a lengthy discussion with the patient and her family again about her condition and treatment options. I showed them the images of the CT scan of the chest in comparison to several scans over the last several months and the changes has been very minimal. I gave the patient the option of resuming her treatment with Nivolumab with the understanding of the concerning toxicity including immunotherapy induced pneumonitis especially with her current wheezing status and frequent episodes of COPD exacerbation. I also has option of continuous observation and close monitoring with  repeat PET scan in 2 months for reevaluation of her disease. After this lengthy discussion the patient and her family preferred to continue on observation for now and she would be considered for treatment only if needed or have evidence for progression of her disease. She was advised to call immediately if she has any concerning symptoms in the interval. The patient voices understanding of current disease status and treatment options and is in agreement with the current care plan.  All questions were answered. The patient knows to call the clinic with any problems, questions or concerns. We can certainly see the patient much sooner if necessary. I spent 15 minutes counseling the patient face to face. The total time spent in the appointment was 25 minutes.  Disclaimer: This note was dictated with voice recognition software. Similar sounding words can inadvertently be transcribed and may not be corrected upon review.

## 2016-06-29 ENCOUNTER — Ambulatory Visit: Payer: Medicare Other | Admitting: Internal Medicine

## 2016-06-30 ENCOUNTER — Encounter (HOSPITAL_COMMUNITY): Payer: Medicare Other

## 2016-06-30 ENCOUNTER — Other Ambulatory Visit: Payer: Self-pay | Admitting: Internal Medicine

## 2016-06-30 ENCOUNTER — Ambulatory Visit: Payer: Medicare Other | Admitting: Family

## 2016-06-30 ENCOUNTER — Telehealth: Payer: Self-pay | Admitting: Internal Medicine

## 2016-06-30 NOTE — Telephone Encounter (Signed)
left msg asking pt to confirm AWV w/ nurse. VDM (DD)

## 2016-07-01 ENCOUNTER — Ambulatory Visit (INDEPENDENT_AMBULATORY_CARE_PROVIDER_SITE_OTHER): Payer: Medicare Other

## 2016-07-01 ENCOUNTER — Telehealth: Payer: Self-pay

## 2016-07-01 VITALS — BP 100/70 | HR 106 | Temp 99.0°F | Ht 66.0 in | Wt 172.0 lb

## 2016-07-01 DIAGNOSIS — Z598 Other problems related to housing and economic circumstances: Secondary | ICD-10-CM | POA: Diagnosis not present

## 2016-07-01 DIAGNOSIS — E2839 Other primary ovarian failure: Secondary | ICD-10-CM | POA: Diagnosis not present

## 2016-07-01 DIAGNOSIS — Z1239 Encounter for other screening for malignant neoplasm of breast: Secondary | ICD-10-CM

## 2016-07-01 DIAGNOSIS — Z0000 Encounter for general adult medical examination without abnormal findings: Secondary | ICD-10-CM

## 2016-07-01 DIAGNOSIS — Z1231 Encounter for screening mammogram for malignant neoplasm of breast: Secondary | ICD-10-CM | POA: Diagnosis not present

## 2016-07-01 DIAGNOSIS — Z599 Problem related to housing and economic circumstances, unspecified: Secondary | ICD-10-CM

## 2016-07-01 DIAGNOSIS — Z5987 Material hardship: Secondary | ICD-10-CM

## 2016-07-01 MED ORDER — METFORMIN HCL 500 MG PO TABS: 500.0000 mg | ORAL_TABLET | Freq: Every day | ORAL | 5 refills | Status: DC | PRN

## 2016-07-01 NOTE — Telephone Encounter (Signed)
Message left on clinical intake voicemail from pharmacy:  Please send refills on metformin and gemfibrozil  Metformin was filled, gemfibrozil is not on current medication list, I left message on voicemail fo patient to return call to discuss

## 2016-07-01 NOTE — Patient Instructions (Signed)
[name] , Thank you for taking time to come for your Medicare Wellness Visit. I appreciate your ongoing commitment to your health goals. Please review the following plan we discussed and let me know if I can assist you in the future.   Screening recommendations/referrals: Colonoscopy due 2023 Mammogram due 2018 Bone Density due Recommended yearly ophthalmology/optometry visit for glaucoma screening and checkup Recommended yearly dental visit for hygiene and checkup  Vaccinations: Influenza vaccine up to date Pneumococcal vaccine PN13 given today. You are up to date Tdap vaccine Shingles vaccine if you decide to get the new one you can call us and we will put in order for you to get it here or at your pharmacy.  Advanced directives: Advance directive discussed with you today. You have a copy to complete at home and have notarized. Once this is complete please bring a copy in to our office so we can scan it into your chart.   Conditions/risks identified: None  Next appointment: Green 4/11 at 2:45 pm   Preventive Care 65 Years and Older, Female Preventive care refers to lifestyle choices and visits with your health care provider that can promote health and wellness. What does preventive care include?  A yearly physical exam. This is also called an annual well check.  Dental exams once or twice a year.  Routine eye exams. Ask your health care provider how often you should have your eyes checked.  Personal lifestyle choices, including:  Daily care of your teeth and gums.  Regular physical activity.  Eating a healthy diet.  Avoiding tobacco and drug use.  Limiting alcohol use.  Practicing safe sex.  Taking low-dose aspirin every day.  Taking vitamin and mineral supplements as recommended by your health care provider. What happens during an annual well check? The services and screenings done by your health care provider during your annual well check will depend on your  age, overall health, lifestyle risk factors, and family history of disease. Counseling  Your health care provider may ask you questions about your:  Alcohol use.  Tobacco use.  Drug use.  Emotional well-being.  Home and relationship well-being.  Sexual activity.  Eating habits.  History of falls.  Memory and ability to understand (cognition).  Work and work Statistician.  Reproductive health. Screening  You may have the following tests or measurements:  Height, weight, and BMI.  Blood pressure.  Lipid and cholesterol levels. These may be checked every 5 years, or more frequently if you are over 49 years old.  Skin check.  Lung cancer screening. You may have this screening every year starting at age 41 if you have a 30-pack-year history of smoking and currently smoke or have quit within the past 15 years.  Fecal occult blood test (FOBT) of the stool. You may have this test every year starting at age 71.  Flexible sigmoidoscopy or colonoscopy. You may have a sigmoidoscopy every 5 years or a colonoscopy every 10 years starting at age 63.  Hepatitis C blood test.  Hepatitis B blood test.  Sexually transmitted disease (STD) testing.  Diabetes screening. This is done by checking your blood sugar (glucose) after you have not eaten for a while (fasting). You may have this done every 1-3 years.  Bone density scan. This is done to screen for osteoporosis. You may have this done starting at age 36.  Mammogram. This may be done every 1-2 years. Talk to your health care provider about how often you should have regular  mammograms. Talk with your health care provider about your test results, treatment options, and if necessary, the need for more tests. Vaccines  Your health care provider may recommend certain vaccines, such as:  Influenza vaccine. This is recommended every year.  Tetanus, diphtheria, and acellular pertussis (Tdap, Td) vaccine. You may need a Td booster  every 10 years.  Zoster vaccine. You may need this after age 45.  Pneumococcal 13-valent conjugate (PCV13) vaccine. One dose is recommended after age 57.  Pneumococcal polysaccharide (PPSV23) vaccine. One dose is recommended after age 86. Talk to your health care provider about which screenings and vaccines you need and how often you need them. This information is not intended to replace advice given to you by your health care provider. Make sure you discuss any questions you have with your health care provider. Document Released: 04/25/2015 Document Revised: 12/17/2015 Document Reviewed: 01/28/2015 Elsevier Interactive Patient Education  2017 [employer] Prevention in the Home Falls can cause injuries. They can happen to people of all ages. There are many things you can do to make your home safe and to help prevent falls. What can I do on the outside of my home?  Regularly fix the edges of walkways and driveways and fix any cracks.  Remove anything that might make you trip as you walk through a door, such as a raised step or threshold.  Trim any bushes or trees on the path to your home.  Use bright outdoor lighting.  Clear any walking paths of anything that might make someone trip, such as rocks or tools.  Regularly check to see if handrails are loose or broken. Make sure that both sides of any steps have handrails.  Any raised decks and porches should have guardrails on the edges.  Have any leaves, snow, or ice cleared regularly.  Use sand or salt on walking paths during winter.  Clean up any spills in your garage right away. This includes oil or grease spills. What can I do in the bathroom?  Use night lights.  Install grab bars by the toilet and in the tub and shower. Do not use towel bars as grab bars.  Use non-skid mats or decals in the tub or shower.  If you need to sit down in the shower, use a plastic, non-slip stool.  Keep the floor dry. Clean up any  water that spills on the floor as soon as it happens.  Remove soap buildup in the tub or shower regularly.  Attach bath mats securely with double-sided non-slip rug tape.  Do not have throw rugs and other things on the floor that can make you trip. What can I do in the bedroom?  Use night lights.  Make sure that you have a light by your bed that is easy to reach.  Do not use any sheets or blankets that are too big for your bed. They should not hang down onto the floor.  Have a firm chair that has side arms. You can use this for support while you get dressed.  Do not have throw rugs and other things on the floor that can make you trip. What can I do in the kitchen?  Clean up any spills right away.  Avoid walking on wet floors.  Keep items that you use a lot in easy-to-reach places.  If you need to reach something above you, use a strong step stool that has a grab bar.  Keep electrical cords out of the way.  Do not use floor polish or wax that makes floors slippery. If you must use wax, use non-skid floor wax.  Do not have throw rugs and other things on the floor that can make you trip. What can I do with my stairs?  Do not leave any items on the stairs.  Make sure that there are handrails on both sides of the stairs and use them. Fix handrails that are broken or loose. Make sure that handrails are as long as the stairways.  Check any carpeting to make sure that it is firmly attached to the stairs. Fix any carpet that is loose or worn.  Avoid having throw rugs at the top or bottom of the stairs. If you do have throw rugs, attach them to the floor with carpet tape.  Make sure that you have a light switch at the top of the stairs and the bottom of the stairs. If you do not have them, ask someone to add them for you. What else can I do to help prevent falls?  Wear shoes that:  Do not have high heels.  Have rubber bottoms.  Are comfortable and fit you well.  Are closed  at the toe. Do not wear sandals.  If you use a stepladder:  Make sure that it is fully opened. Do not climb a closed stepladder.  Make sure that both sides of the stepladder are locked into place.  Ask someone to hold it for you, if possible.  Clearly mark and make sure that you can see:  Any grab bars or handrails.  First and last steps.  Where the edge of each step is.  Use tools that help you move around (mobility aids) if they are needed. These include:  Canes.  Walkers.  Scooters.  Crutches.  Turn on the lights when you go into a dark area. Replace any light bulbs as soon as they burn out.  Set up your furniture so you have a clear path. Avoid moving your furniture around.  If any of your floors are uneven, fix them.  If there are any pets around you, be aware of where they are.  Review your medicines with your doctor. Some medicines can make you feel dizzy. This can increase your chance of falling. Ask your doctor what other things that you can do to help prevent falls. This information is not intended to replace advice given to you by your health care provider. Make sure you discuss any questions you have with your health care provider. Document Released: 01/23/2009 Document Revised: 09/04/2015 Document Reviewed: 05/03/2014 Elsevier Interactive Patient Education  2017 [employer].

## 2016-07-01 NOTE — Progress Notes (Signed)
   I reviewed health advisor's note, was available for consultation and agree with the assessment and plan as written.  I will try to send this to Dr. Nyoka Cowden so he can order hba1c.    Nikolaus Pienta L. Ermine Spofford, D.O. [facility] [address], [place] [address] Cell Phone (Mon-Fri 8am-5pm):  [phone] On Call:  [phone] & follow prompts after 5pm & weekends Office Phone:  [phone] Office Fax:  [phone]   Quick Notes   Health Maintenance: Pt needs a mammogram, dexa, eye exam and HgA1C. Putting in referrals for mmg and dexa. Pt will make eye exam     Abnormal Screen: PHQ-10     Patient Concerns: None     Nurse Concerns: None

## 2016-07-01 NOTE — Progress Notes (Signed)
Subjective:   [name] is a 71 y.o. female who presents for an Initial Medicare Annual Wellness Visit. Cardiac Risk Factors include: advanced age (>85mn, >>70women);diabetes mellitus;dyslipidemia;hypertension;family history of premature cardiovascular disease;sedentary lifestyle;smoking/ tobacco exposure     Objective:    Today's Vitals   07/01/16 1520  BP: 100/70  Pulse: (!) 106  Temp: 99 F (37.2 C)  TempSrc: Oral  SpO2: 97%  Weight: 172 lb (78 kg)  Height: 5' 6"  (1.676 m)  PainSc: 4    Body mass index is 27.76 kg/m.   Current Medications (verified) Outpatient Encounter Prescriptions as of 07/01/2016  Medication Sig  . ACCU-CHEK SOFTCLIX LANCETS lancets Use as instructed to check blood sugar three times daily. Dx: E11.9, E11.49  . ALPRAZolam (XANAX) 1 MG tablet TAKE 1 TABLET BY MOUTH THREE TIMES A DAY AS NEEDED FOR ANXIETY  . amoxicillin-clavulanate (AUGMENTIN) 875-125 MG tablet Take 1 tablet by mouth 2 (two) times daily.  .Marland Kitchenaspirin 81 MG tablet Take 81 mg by mouth daily.  . Blood Glucose Monitoring Suppl (ACCU-CHEK AVIVA PLUS) w/Device KIT Use as directed to check blood sugar Dx: E11.9, E11.49  . cetirizine-pseudoephedrine (ZYRTEC-D) 5-120 MG tablet One twice daily to help dizziness (Patient taking differently: Take 1 tablet by mouth 2 (two) times daily as needed for allergies (dizzeness). )  . citalopram (CELEXA) 20 MG tablet TAKE 1 TABLET BY MOUTH EVERY DAY TO HELP MOOD (Patient taking differently: TAKE 259mTABLET BY MOUTH EVERY DAY TO HELP MOOD)  . dextromethorphan-guaiFENesin (MUCINEX DM) 30-600 MG 12hr tablet Take 1 tablet by mouth 2 (two) times daily.  . Marland Kitcheniltiazem (CARDIZEM CD) 240 MG 24 hr capsule TAKE 1 CAPSULE BY MOUTH EVERY DAY FOR BLOOD PRESSURE.  . fluticasone (FLONASE) 50 MCG/ACT nasal spray INSTILL 2 SPRAYS IN EACH NOSTRIL TWICE DAILY *NEEDS APPOINTMENT*  . glucose blood (ACCU-CHEK AVIVA PLUS) test strip Use as instructed to check blood sugar three  times daily. Dx:E11.9, E11.49  . HEMATINIC/FOLIC ACID 32500-9G TABS TAKE 1 TABLET BY MOUTH EVERY DAY  . HYDROcodone-acetaminophen (NORCO) 10-325 MG tablet Take One tablet by mouth every 6 hours if needed for pain  . Ipratropium-Albuterol (COMBIVENT RESPIMAT) 20-100 MCG/ACT AERS respimat Inhale 2 puffs into the lungs 4 (four) times daily.  . Elmore Guiseevices (ACCU-CHEK SOFTCLIX) lancets Use as instructed to check blood sugar three times daily. Dx: E11.9, E11.49  . loratadine (CLARITIN) 10 MG tablet TAKE 1 TABLET BY MOUTH EVERY DAY (Patient taking differently: TAKE 1021mABLET BY MOUTH once daily as needed for allergies)  . loratadine (CLARITIN) 10 MG tablet TAKE 1 TABLET BY MOUTH EVERY DAY  . metFORMIN (GLUCOPHAGE) 500 MG tablet Take 1 tablet (500 mg total) by mouth daily as needed (high blood sugar- 160+). Take one tablet at night for blood sugar  . Multiple Vitamins-Minerals (CENTRUM SILVER PO) Take 1 tablet by mouth daily. Patient unsure of dose  . NON FORMULARY Place 3 L into the nose daily. Oxygen  . Phenylephrine-DM-GG-APAP (DELSYM COUGH/COLD DAYTIME PO) Take by mouth.  . potassium chloride (K-DUR,KLOR-CON) 10 MEQ tablet TAKE 1 TABLET BY MOUTH EVERY DAY  . ranitidine (ZANTAC) 300 MG tablet TAKE 1 TABLET BY MOUTH EVERY NIGHT AT BEDTIME  . traMADol (ULTRAM) 50 MG tablet TAKE 1 TABLET BY MOUTH THREE TIMES A DAY AS NEEDED FOR PAIN  . triamterene-hydrochlorothiazide (MAXZIDE-25) 37.5-25 MG tablet TAKE 1 TABLET BY MOUTH EVERY DAY TO CONTROL BLOOD PRESSURE  . [DISCONTINUED] fluconazole (DIFLUCAN) 100 MG tablet Take one tablet  daily  . [DISCONTINUED] losartan (COZAAR) 50 MG tablet Take 1 tablet (50 mg total) by mouth 2 (two) times daily.   Facility-Administered Encounter Medications as of 07/01/2016  Medication  . hyaluronate sodium (RADIAPLEXRX) gel  . topical emolient (BIAFINE) emulsion    Allergies (verified) Iohexol; Other; Anoro ellipta [umeclidinium-vilanterol]; Codeine; and Protonix  [pantoprazole sodium]   History: Past Medical History:  Diagnosis Date  . Acute posthemorrhagic anemia   . Acute sinusitis, unspecified   . Acute upper respiratory infections of unspecified site   . Adenocarcinoma, lung ([place])    upper left lobe  . Anemia in neoplastic disease   . Anemia, unspecified   . Anginal pain ([place])   . Anxiety   . Anxiety state, unspecified   . Arthritis    "all over my body"  . Asthma   . Asymptomatic varicose veins   . Atherosclerosis of native arteries of the extremities, unspecified   . Atrial fibrillation ([place])   . Bronchial pneumonia    history  . 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 Chemotherapy adverse reaction 09/15/11   "makes me light headed and pass out; this is 3rd blood transfusion since going thru it"  . CHF (congestive heart failure) ([place])   . Chronic airway obstruction, not elsewhere classified   . Chronic fatigue 08/19/2015  . Chronic sinusitis   . Complication of anesthesia    slow to wake up  . COPD (chronic obstructive pulmonary disease) ([place])   . Coronary artery disease    Dr. Burt Knack had stress test done  . DDD (degenerative disc disease), lumbar   . Depressive disorder, not elsewhere classified   . Diseases of lips   . Disorder of bone and cartilage, unspecified   . Dizziness and giddiness   . DVT (deep venous thrombosis) ([place])   . Dysrhythmia    irregular  . Effects of radiation, unspecified   . Emphysema   . Encounter for antineoplastic immunotherapy 08/19/2015  . Fibromyalgia   . Functional urinary incontinence   . GERD (gastroesophageal reflux disease)   . Hard of hearing, right   . Heart murmur   . Herpes zoster without mention of complication   . Hip pain, chronic 09/17/2014  . History of blood clots    "2 in my aorta"  . History of blood transfusion   . History of bronchitis   . Hx of radiation therapy 04/28/11 -06/08/11   right upper lobe-lung  . Hyperlipidemia   . Hyperpotassemia   . Hypertension   .  Hypopotassemia   . Kidney infection    "related to chemo"  . Lumbago   . Malignant neoplasm of bronchus and lung, unspecified site   . Mental disorder   . Muscle weakness (generalized)   . Muscle weakness (generalized)   . Non-small cell lung cancer ([place]) 03/31/2011   Adenocarcinoma Rt upper lobe mass  . Osteoarthrosis, unspecified whether generalized or localized, unspecified site   . Osteoporosis   . Other abnormal blood chemistry   . Other and unspecified hyperlipidemia   . PAD (peripheral artery disease) ([place])   . Pain in thoracic spine   . Peripheral vascular disease ([place])   . 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 Primary cancer of right upper lobe of lung ([place]) 02/08/2011   Biopsy Rt upper lobe mass 03/31/11>>adenocarcinoma DIAGNOSIS: Stage IIB/IIIA non-small cell lung cancer consistent with adenocarcinoma diagnosed in December 2012.   PRIOR THERAPY:  1) Status post radiation therapy to the  right upper lobe lung mass completed on 06/04/2011 under the care of Dr. Pablo Ledger.  2) Systemic chemotherapy with carboplatin for AUC of 5 and Alimta 500 mg/M2 giving every 3   . Reflux   . Reflux esophagitis   . Respiratory failure with hypoxia 01/02/2010  . Restless legs syndrome (RLS)   . Scoliosis   . Screening for thyroid disorder   . Shortness of breath    "anytime really"  . Sinus headache    "all the time"  . Spasm of muscle   . Syncope and collapse   . Type II or unspecified type diabetes mellitus with neurological manifestations, uncontrolled(250.62)   . Type II or unspecified type diabetes mellitus without mention of complication, not stated as uncontrolled   . Type II or unspecified type diabetes mellitus without mention of complication, not stated as uncontrolled   . Unspecified constipation   . Unspecified essential hypertension   . Unspecified hearing loss   . Unspecified hereditary and idiopathic peripheral neuropathy   . Unspecified vitamin D deficiency   . Urinary  tract infection, site not specified    Past Surgical History:  Procedure Laterality Date  . BRONCHOSCOPY  02/2011  . CARDIAC CATHETERIZATION    . CATARACT EXTRACTION  03/12/2013   Left Eye   . DILATION AND CURETTAGE OF UTERUS  1980's  . EYE SURGERY Left 2014   Cataract  . FETAL BLOOD TRANSFUSION  09/16/11  . JOINT REPLACEMENT  04/13/10   Left Hip  . LUNG LOBECTOMY  1986   left  . SEPTOPLASTY    . TOTAL HIP ARTHROPLASTY  01/2010   left  . TUBAL LIGATION  1980's   Family History  Problem Relation Age of Onset  . Bone cancer Father   . Cancer Father     Bone  . Diabetes Mother   . Deep vein thrombosis Mother   . Heart disease Mother     Heart Disease before age 59-  PVD  . Hyperlipidemia Mother   . Hypertension Mother   . Varicose Veins Mother   . Arthritis Daughter    Social History   Occupational History  . disabled   . RETIRED Retired   Social History Main Topics  . Smoking status: Former Smoker    Packs/day: 1.00    Years: 40.00    Types: Cigarettes    Quit date: 02/10/1997  . Smokeless tobacco: Never Used  . Alcohol use No  . Drug use: No  . Sexual activity: No    Tobacco Counseling Counseling given: Not Answered   Activities of Daily Living In your present state of health, do you have any difficulty performing the following activities: 07/01/2016  Hearing? Y  Vision? N  Difficulty concentrating or making decisions? Y  Walking or climbing stairs? Y  Dressing or bathing? Y  Doing errands, shopping? N  Preparing Food and eating ? Y  Using the Toilet? Y  In the past six months, have you accidently leaked urine? Y  Do you have problems with loss of bowel control? N  Managing your Medications? Y  Managing your Finances? Y  Housekeeping or managing your Housekeeping? Y  Some recent data might be hidden    Immunizations and Health Maintenance Immunization History  Administered Date(s) Administered  . Influenza Split 02/08/2007, 03/19/2011, 01/27/2012    . Influenza Whole 01/02/2010  . Influenza,inj,Quad PF,36+ Mos 12/27/2012, 02/12/2014, 01/22/2016  . Pneumococcal Polysaccharide-23 03/27/2013   Health Maintenance Due  Topic Date Due  .  Hepatitis C Screening  [birth year]  . FOOT EXAM  03/06/1956  . COLONOSCOPY  03/06/1994  . DEXA SCAN  03/06/2009  . MAMMOGRAM  03/24/2014  . PNA vac Low Risk Adult (2 of 2 - PCV13) 03/27/2014  . OPHTHALMOLOGY EXAM  10/31/2015  . HEMOGLOBIN A1C  02/05/2016    Patient Care Team: Estill Dooms, MD as PCP - General (Internal Medicine) Sherren Mocha, MD as Consulting Physician (Cardiology) Collene Gobble, MD as Consulting Physician (Pulmonary Disease) Elam Dutch, MD as Consulting Physician (Vascular Surgery) Curt Bears, MD as Consulting Physician (Oncology)  Indicate any recent Medical Services you may have received from other than [facility] providers in the past year (date may be approximate).     Assessment:   This is a routine wellness examination for [place].   Hearing/Vision screen No exam data present  Dietary issues and exercise activities discussed: Current Exercise Habits: The patient does not participate in regular exercise at present, Exercise limited by: respiratory conditions(s)  Goals    . Maintain Lifestyle          Starting 3/2//2018 I will maintain my lifestyle.      Depression Screen PHQ 2/9 Scores 07/01/2016 10/28/2015 09/17/2014 05/21/2014 03/12/2014 01/09/2014 12/04/2013  PHQ - 2 Score 2 1 0 0 0 0 0  PHQ- 9 Score 10 7 - - - - -    Fall Risk Fall Risk  07/01/2016 10/28/2015 07/29/2015 06/04/2015 01/02/2015  Falls in the past year? Yes No Yes No No  Number falls in past yr: 2 or more - 2 or more - -  Injury with Fall? No - No - -    Cognitive Function: MMSE - Mini Mental State Exam 07/01/2016  Orientation to time 4  Orientation to Place 5  Registration 3  Attention/ Calculation 5  Recall 2  Language- name 2 objects 2  Language- repeat 1  Language- follow 3 step  command 3  Language- read & follow direction 1  Write a sentence 1  Copy design 1  Total score 28        Screening Tests Health Maintenance  Topic Date Due  . Hepatitis C Screening  05/30/1945  . FOOT EXAM  03/06/1956  . COLONOSCOPY  03/06/1994  . DEXA SCAN  03/06/2009  . MAMMOGRAM  03/24/2014  . PNA vac Low Risk Adult (2 of 2 - PCV13) 03/27/2014  . OPHTHALMOLOGY EXAM  10/31/2015  . HEMOGLOBIN A1C  02/05/2016  . TETANUS/TDAP  06/10/2017 (Originally 03/07/1965)      Plan:    I have personally reviewed and addressed the Medicare Annual Wellness questionnaire and have noted the following in the patient's chart:  A. Medical and social history B. Use of alcohol, tobacco or illicit drugs  C. Current medications and supplements D. Functional ability and status E.  Nutritional status F.  Physical activity G. Advance directives H. List of other physicians I.  Hospitalizations, surgeries, and ER visits in previous 12 months J.  [place] to include hearing, vision, cognitive, depression L. Referrals and appointments - none  In addition, I have reviewed and discussed with patient certain preventive protocols, quality metrics, and best practice recommendations. A written personalized care plan for preventive services as well as general preventive health recommendations were provided to patient.  See attached scanned questionnaire for additional information.   Signed,   Rich Reining, RN Nurse Health Advisor

## 2016-07-02 ENCOUNTER — Other Ambulatory Visit: Payer: Self-pay | Admitting: Nurse Practitioner

## 2016-07-02 ENCOUNTER — Other Ambulatory Visit: Payer: Self-pay | Admitting: Internal Medicine

## 2016-07-06 ENCOUNTER — Other Ambulatory Visit: Payer: Self-pay | Admitting: *Deleted

## 2016-07-06 MED ORDER — DILTIAZEM HCL ER COATED BEADS 240 MG PO CP24: ORAL_CAPSULE | ORAL | 3 refills | Status: DC

## 2016-07-06 NOTE — Telephone Encounter (Signed)
Patient requested to be refilled at [facility] ([place])

## 2016-07-07 ENCOUNTER — Telehealth: Payer: Self-pay | Admitting: *Deleted

## 2016-07-07 NOTE — Telephone Encounter (Signed)
Diflucan 100 mg (3) One daily for fungal infection

## 2016-07-07 NOTE — Telephone Encounter (Signed)
Patient called and stated that her lips are swollen, red and cracked. Patient does not think this is yeast, she thinks it is a fungus. She stated that she has been on '875mg'$  of Antibiotic two different times in the last 3 months. Wants something called in for it, does not want an appointment. Please Advise.

## 2016-07-07 NOTE — Telephone Encounter (Signed)
Called patient, no answer. Debbie please follow-up with this patient to see why gemfibrozil was requested. This medication is no longer on patient's medication list.

## 2016-07-08 ENCOUNTER — Other Ambulatory Visit: Payer: Self-pay

## 2016-07-08 ENCOUNTER — Telehealth: Payer: Self-pay | Admitting: Emergency Medicine

## 2016-07-08 MED ORDER — FLUCONAZOLE 100 MG PO TABS: 100.0000 mg | ORAL_TABLET | Freq: Every day | ORAL | 0 refills | Status: DC

## 2016-07-08 MED ORDER — DOXYCYCLINE HYCLATE 100 MG PO TABS: 100.0000 mg | ORAL_TABLET | Freq: Two times a day (BID) | ORAL | 0 refills | Status: DC

## 2016-07-08 MED ORDER — PREDNISONE 10 MG PO TABS: ORAL_TABLET | ORAL | 0 refills | Status: DC

## 2016-07-08 MED ORDER — ALPRAZOLAM 1 MG PO TABS: ORAL_TABLET | ORAL | 0 refills | Status: DC

## 2016-07-08 NOTE — Telephone Encounter (Signed)
Left message on patient's voicemail that gemfibrozil is not on her medication list. We don't know if one of the other doctors has taken it off. But Dr. Nyoka Cowden hasn't seen her since 02/10/2016. We would have to see her.

## 2016-07-08 NOTE — Telephone Encounter (Signed)
Refill for Alprazolam sent to [facility] by phone.

## 2016-07-08 NOTE — Telephone Encounter (Signed)
Medication added to medication list and sent to pharmacy. Chrae will you call patient and let her know since i'm at facility. Thanks.

## 2016-07-08 NOTE — Telephone Encounter (Signed)
Patient aware.

## 2016-07-08 NOTE — Telephone Encounter (Signed)
Called spoke with patient who c/o increased chest congestion, rattling in her chest, wheezing, increased SOB, does have some cough but with minimal yellow mucus.  She thinks she has been running some fever but does not have a thermometer.  Symptoms began about 3-4 days ago.  Denies any chest pain, hemoptysis.  Using Tussin cough syrup PCP gave her Fluconazole for thrush Not taking chemo for another month, so she is able to take steroids  Allergies  Allergen Reactions  . Iohexol Shortness Of Breath and Other (See Comments)    "burns me up inside"    Pt does fine with 13 hour prep  01/17/12  . Other     CAN'T STAND THE SMELL OF PURE BLEACH; "HAVE TO BACK UP AND POUR AND DON'T BREATH IT TIL i GET CAP BACK ON; DILUTE IT TO SPRAY"  . Anoro Ellipta [Umeclidinium-Vilanterol] Swelling    Swelling of lips and mouth  . Codeine Itching and Nausea And Vomiting    REACTION: GI upset  . Protonix [Pantoprazole Sodium] Diarrhea   [facility]  Dr Lamonte Sakai was sitting beside me while speaking with patient.  He advised:  Prednisone taper '10mg'$  4x3, 3x3, 2x3, 1x3 and stop and doxycycline '100mg'$  BID x7days #14.  ATC pt back x2 to provide recommendations - went to VM but unable to leave message because VM has not been set up yet.  WCB.  Rx's have already been sent to pharmacy.

## 2016-07-12 MED ORDER — PREDNISONE 10 MG PO TABS: ORAL_TABLET | ORAL | 0 refills | Status: DC

## 2016-07-12 MED ORDER — DOXYCYCLINE HYCLATE 100 MG PO TABS: 100.0000 mg | ORAL_TABLET | Freq: Two times a day (BID) | ORAL | 0 refills | Status: DC

## 2016-07-12 MED ORDER — IPRATROPIUM-ALBUTEROL 20-100 MCG/ACT IN AERS: 2.0000 | INHALATION_SPRAY | Freq: Four times a day (QID) | RESPIRATORY_TRACT | 5 refills | Status: DC

## 2016-07-12 NOTE — Telephone Encounter (Signed)
appt with Dr. Nyoka Cowden 07/21/16

## 2016-07-12 NOTE — Telephone Encounter (Signed)
Spoke with pt and made her aware of RB's recommendations. Rx sent to preferred pharmacy. Nothing further needed.

## 2016-07-14 ENCOUNTER — Other Ambulatory Visit: Payer: Self-pay | Admitting: Internal Medicine

## 2016-07-16 ENCOUNTER — Other Ambulatory Visit: Payer: Self-pay

## 2016-07-16 DIAGNOSIS — G8929 Other chronic pain: Secondary | ICD-10-CM

## 2016-07-16 DIAGNOSIS — M25552 Pain in left hip: Secondary | ICD-10-CM

## 2016-07-16 DIAGNOSIS — M51369 Other intervertebral disc degeneration, lumbar region without mention of lumbar back pain or lower extremity pain: Secondary | ICD-10-CM

## 2016-07-16 DIAGNOSIS — M5136 Other intervertebral disc degeneration, lumbar region: Secondary | ICD-10-CM

## 2016-07-16 DIAGNOSIS — G44229 Chronic tension-type headache, not intractable: Secondary | ICD-10-CM

## 2016-07-16 MED ORDER — HYDROCODONE-ACETAMINOPHEN 10-325 MG PO TABS: ORAL_TABLET | ORAL | 0 refills | Status: DC

## 2016-07-16 NOTE — Telephone Encounter (Signed)
Patient called and requested.

## 2016-07-19 NOTE — Telephone Encounter (Signed)
Error

## 2016-07-21 ENCOUNTER — Ambulatory Visit (INDEPENDENT_AMBULATORY_CARE_PROVIDER_SITE_OTHER): Payer: Medicare Other | Admitting: Internal Medicine

## 2016-07-21 ENCOUNTER — Encounter: Payer: Self-pay | Admitting: Internal Medicine

## 2016-07-21 VITALS — BP 120/86 | HR 90 | Temp 98.0°F | Ht 66.0 in | Wt 174.0 lb

## 2016-07-21 DIAGNOSIS — B370 Candidal stomatitis: Secondary | ICD-10-CM | POA: Insufficient documentation

## 2016-07-21 DIAGNOSIS — E1142 Type 2 diabetes mellitus with diabetic polyneuropathy: Secondary | ICD-10-CM | POA: Diagnosis not present

## 2016-07-21 DIAGNOSIS — J320 Chronic maxillary sinusitis: Secondary | ICD-10-CM

## 2016-07-21 DIAGNOSIS — D649 Anemia, unspecified: Secondary | ICD-10-CM

## 2016-07-21 DIAGNOSIS — C3411 Malignant neoplasm of upper lobe, right bronchus or lung: Secondary | ICD-10-CM | POA: Diagnosis not present

## 2016-07-21 DIAGNOSIS — J449 Chronic obstructive pulmonary disease, unspecified: Secondary | ICD-10-CM | POA: Diagnosis not present

## 2016-07-21 DIAGNOSIS — I10 Essential (primary) hypertension: Secondary | ICD-10-CM | POA: Diagnosis not present

## 2016-07-21 DIAGNOSIS — B379 Candidiasis, unspecified: Secondary | ICD-10-CM

## 2016-07-21 DIAGNOSIS — E785 Hyperlipidemia, unspecified: Secondary | ICD-10-CM | POA: Diagnosis not present

## 2016-07-21 MED ORDER — LORATADINE-PSEUDOEPHEDRINE ER 5-120 MG PO TB12: ORAL_TABLET | ORAL | 5 refills | Status: DC

## 2016-07-21 MED ORDER — FLUCONAZOLE 100 MG PO TABS: ORAL_TABLET | ORAL | 1 refills | Status: DC

## 2016-07-21 NOTE — Progress Notes (Signed)
Facility  [place]    Place of Service:   OFFICE    Allergies  Allergen Reactions  . Iohexol Shortness Of Breath and Other (See Comments)    "burns me up inside"    Pt does fine with 13 hour prep  01/17/12  . Other     CAN'T STAND THE SMELL OF PURE BLEACH; "HAVE TO BACK UP AND POUR AND DON'T BREATH IT TIL i GET CAP BACK ON; DILUTE IT TO SPRAY"  . Anoro Ellipta [Umeclidinium-Vilanterol] Swelling    Swelling of lips and mouth  . Codeine Itching and Nausea And Vomiting    REACTION: GI upset  . Protonix [Pantoprazole Sodium] Diarrhea    Chief Complaint  Patient presents with  . Medical Management of Chronic Issues    medication management blood sugar, anemia. Last seen 02/10/16  . Medication Management    was one Gemfibrozil-this was stopped, she wants to know if she should still be on it.  . Oral Pain    corners of mouth cracks, red, has taken Fluconazole for it, helped. Reddness on check . Patient said she has Rosacea, or thinks the cancer is causeing this.  . Diarrhea    HPI:   Chronic obstructive pulmonary disease, unspecified COPD type ([place]) - continues to see pulmonologist, [name]. O2 dependent  Anemia, unspecified type - improved  DM type 2 with diabetic peripheral neuropathy (HCC) - controlled  Dyslipidemia - controlled  Primary cancer of right upper lobe of lung ([place]) - slightly larger on latest CT chest. Continues with [name].  Essential hypertension - controlled  Candida infection - oral. Mouth burns and lips burn, Had angular cheilosis. Imroved wth Diflucan. At risk due to DM and multiple antibiotic courses.  Chronic maxillary sinusitis - chronic sinus drainage. Better with Claritin or Zyrtec, but she says the Zyrtec makes her drowsy.    Medications: Patient's Medications  New Prescriptions   No medications on file  Previous Medications   ACCU-CHEK SOFTCLIX LANCETS LANCETS    by Other route. Check blood sugar twice daily. Dx: E11.9, E11.49   ACCU-CHEK SOFTCLIX LANCETS LANCETS    by Other route. Test Strips. Use one strip twice daily to check blood sugar. Dx E11.9, E11.49   ALPRAZOLAM (XANAX) 1 MG TABLET    TAKE 1 TABLET BY MOUTH THREE TIMES A DAY AS NEEDED FOR ANXIETY   ASPIRIN 81 MG TABLET    Take 81 mg by mouth daily.   BLOOD GLUCOSE MONITORING SUPPL (ACCU-CHEK AVIVA PLUS) W/DEVICE KIT    Use as directed to check blood sugar Dx: E11.9, E11.49   CETIRIZINE-PSEUDOEPHEDRINE (ZYRTEC-D) 5-120 MG TABLET    One twice daily to help dizziness   CITALOPRAM (CELEXA) 20 MG TABLET    TAKE 1 TABLET BY MOUTH EVERY DAY TO HELP MOOD   DILTIAZEM (CARDIZEM CD) 240 MG 24 HR CAPSULE    Take one capsule by mouth once daily for blood pressure   FLUTICASONE (FLONASE) 50 MCG/ACT NASAL SPRAY    INSTILL 2 SPRAYS IN EACH NOSTRIL TWICE DAILY *NEEDS APPOINTMENT*   HEMATINIC/FOLIC ACID 826-4 MG TABS    TAKE 1 TABLET BY MOUTH EVERY DAY   HYDROCODONE-ACETAMINOPHEN (NORCO) 10-325 MG TABLET    Take One tablet by mouth every 6 hours if needed for pain   IPRATROPIUM-ALBUTEROL (COMBIVENT RESPIMAT) 20-100 MCG/ACT AERS RESPIMAT    Inhale 2 puffs into the lungs 4 (four) times daily.   LORATADINE (CLARITIN) 10 MG TABLET    TAKE 1  TABLET BY MOUTH EVERY DAY   LORATADINE (CLARITIN) 10 MG TABLET    Take 10 mg by mouth daily.   METFORMIN (GLUCOPHAGE) 500 MG TABLET    Take 1 tablet (500 mg total) by mouth daily as needed (high blood sugar- 160+). Take one tablet at night for blood sugar   MULTIPLE VITAMINS-MINERALS (CENTRUM SILVER PO)    Take 1 tablet by mouth daily. Patient unsure of dose   NON FORMULARY    Place 3 L into the nose daily. Oxygen   PHENYLEPHRINE-DM-GG-APAP (DELSYM COUGH/COLD DAYTIME PO)    Take by mouth.   POTASSIUM CHLORIDE (K-DUR,KLOR-CON) 10 MEQ TABLET    TAKE 1 TABLET BY MOUTH EVERY DAY   RANITIDINE (ZANTAC) 300 MG TABLET    TAKE 1 TABLET BY MOUTH EVERY NIGHT AT BEDTIME   TRAMADOL (ULTRAM) 50 MG TABLET    TAKE 1 TABLET BY MOUTH THREE TIMES A DAY AS NEEDED FOR  PAIN   TRIAMTERENE-HYDROCHLOROTHIAZIDE (MAXZIDE-25) 37.5-25 MG TABLET    TAKE 1 TABLET BY MOUTH EVERY DAY TO CONTROL BLOOD PRESSURE  Modified Medications   No medications on file  Discontinued Medications   ACCU-CHEK SOFTCLIX LANCETS LANCETS    Use as instructed to check blood sugar three times daily. Dx: E11.9, E11.49   AMOXICILLIN-CLAVULANATE (AUGMENTIN) 875-125 MG TABLET    Take 1 tablet by mouth 2 (two) times daily.   DEXTROMETHORPHAN-GUAIFENESIN (MUCINEX DM) 30-600 MG 12HR TABLET    Take 1 tablet by mouth 2 (two) times daily.   DOXYCYCLINE (VIBRA-TABS) 100 MG TABLET    Take 1 tablet (100 mg total) by mouth 2 (two) times daily.   DOXYCYCLINE (VIBRA-TABS) 100 MG TABLET    Take 1 tablet (100 mg total) by mouth 2 (two) times daily.   FLUCONAZOLE (DIFLUCAN) 100 MG TABLET    TAKE 1 TABLET BY MOUTH DAILY FOR FUNGAL INFECTION   GLUCOSE BLOOD (ACCU-CHEK AVIVA PLUS) TEST STRIP    Use as instructed to check blood sugar three times daily. Dx:E11.9, E11.49   LANCET DEVICES (ACCU-CHEK SOFTCLIX) LANCETS    Use as instructed to check blood sugar three times daily. Dx: E11.9, E11.49   LORATADINE (CLARITIN) 10 MG TABLET    TAKE 1 TABLET BY MOUTH EVERY DAY   PREDNISONE (DELTASONE) 10 MG TABLET    Take 4 tabs once daily x3 days, then 3 tabs for 3 days, 2 tabs for 3 days, 1 tab for 3 days and stop.   PREDNISONE (DELTASONE) 10 MG TABLET    4 tabs for 3 days, 3 tabs x 3 days, 2 tabs x 3 days, 1 tabs x 3 days then stop    Review of Systems  Constitutional: Positive for activity change. Negative for chills and fever.  HENT: Positive for postnasal drip, sinus pressure and trouble swallowing. Negative for congestion, ear pain and sore throat.        Popping noise in ears  Eyes: Negative.   Respiratory: Positive for cough and shortness of breath. Negative for choking, chest tightness, wheezing and stridor.        Lung cancer. O2 dependent. Portable O2.  Cardiovascular: Negative for chest pain and palpitations.    Gastrointestinal: Positive for constipation. Negative for abdominal distention, abdominal pain, diarrhea and nausea.       Reflux frequently.  Endocrine:       DM on metformin  Musculoskeletal: Positive for arthralgias, back pain, gait problem, myalgias and neck pain.       Left hip pain  Skin:  Generalized itching and occasional red bumps.  Neurological: Positive for dizziness (true vertigo), weakness (generalized), light-headedness and headaches. Negative for tremors and syncope.  Hematological:       Hx of anemia; currently normal hgb.  Psychiatric/Behavioral: Positive for dysphoric mood and sleep disturbance. Negative for hallucinations. The patient is not nervous/anxious and is not hyperactive.     Vitals:   07/21/16 1512  BP: 120/86  Pulse: 90  Temp: 98 F (36.7 C)  TempSrc: Oral  SpO2: 98%  Weight: 174 lb (78.9 kg)  Height: _0  (1.676 m)   Body mass index is 28.08 kg/m. Wt Readings from Last 3 Encounters:  07/21/16 174 lb (78.9 kg)  07/01/16 172 lb (78 kg)  06/25/16 173 lb 9.6 oz (78.7 kg)      Physical Exam  Constitutional: She is oriented to person, place, and time.  overweight  HENT:  Nose: Nose normal.  Mouth/Throat: Oropharynx is clear and moist. No oropharyngeal exudate.  Loss of hearing. Hearing aid left ear.  Eyes: Conjunctivae and EOM are normal. Pupils are equal, round, and reactive to light.  Neck: No JVD present. No tracheal deviation present. No thyromegaly present.  Cardiovascular: Normal rate, regular rhythm and normal heart sounds.  Exam reveals no gallop and no friction rub.   No murmur heard. Pulmonary/Chest: No respiratory distress. She has no wheezes. She has no rales. She exhibits no tenderness.  Abdominal: She exhibits no distension and no mass. There is no tenderness.  Musculoskeletal: Normal range of motion. She exhibits edema and tenderness.  Lymphadenopathy:    She has no cervical adenopathy.  Neurological: She is oriented  to person, place, and time. No cranial nerve deficit. Coordination normal.  Vertigo with movement of the head.  Skin: No rash noted. No erythema. No pallor.  Psychiatric: Her behavior is normal. Judgment and thought content normal.    Labs reviewed: Lab Summary Latest Ref Rng & Units 06/25/2016 06/02/2016  Hemoglobin 11.6 - 15.9 g/dL 11.7 11.0(L)  Hematocrit 34.8 - 46.6 % 36.6 33.5(L)  White count 3.9 - 10.3 10e3/uL 8.7 7.8  Platelet count 145 - 400 10e3/uL 277 298  Sodium 136 - 145 mEq/L 140 140  Potassium 3.5 - 5.1 mEq/L 4.1 3.4(L)  Calcium 8.4 - 10.4 mg/dL 9.9 10.0  Phosphorus - (None) (None)  Creatinine 0.6 - 1.1 mg/dL 1.0 1.1  AST 5 - 34 U/L 16 15  Alk Phos 40 - 150 U/L 92 108  Bilirubin 0.20 - 1.20 mg/dL 0.35 0.35  Glucose 70 - 140 mg/dl 88 195(H)  Cholesterol - (None) (None)  HDL cholesterol - (None) (None)  Triglycerides - (None) (None)  LDL Direct - (None) (None)  LDL Calc - (None) (None)  Total protein 6.4 - 8.3 g/dL 6.5 6.3(L)  Albumin 3.5 - 5.0 g/dL 3.6 3.4(L)  Some recent data might be hidden   Lab Results  Component Value Date   TSH 2.384 06/25/2016   TSH 2.208 02/18/2016   TSH 1.696 01/22/2016   Lab Results  Component Value Date   BUN 15.5 06/25/2016   BUN 17.3 06/02/2016   BUN 15.4 03/31/2016   Lab Results  Component Value Date   HGBA1C 5.5 08/06/2015   HGBA1C 5.7 (H) 12/18/2013   HGBA1C 5.1 09/15/2011    Assessment/Plan  1. Chronic obstructive pulmonary disease, unspecified COPD type ([place]) The current medical regimen is effective;  continue present plan and medications.  2. Anemia, unspecified type Stop iron. This may help morning nausea as well.  3. DM type 2 with diabetic peripheral neuropathy (HCC) The current medical regimen is effective;  continue present plan and medications.  4. Dyslipidemia Considering her age and other problems, I do not thnk this is a clinically important issue and I do not recommend resuming gemfibrozil.  5.  Primary cancer of right upper lobe of lung ([place]) Continue with [name]  6. Essential hypertension BP well controlled. I stopped the diltiazem to see if the edema will imporove.   7. Candida infection - fluconazole (DIFLUCAN) 100 MG tablet; TAKE 1 TABLET BY MOUTH DAILY FOR FUNGAL INFECTION  Dispense: 3 tablet; Refill: 1  8. Chronic maxillary sinusitis Stop Zyrtec D - loratadine-pseudoephedrine (CLARITIN-D 12-HOUR) 5-120 MG tablet; One up to twice daily for allergy and congestion  Dispense: 60 tablet; Refill: 5

## 2016-07-21 NOTE — Patient Instructions (Signed)
STOP Zyrtec D, Diltiazem, Ferrous fumarate.

## 2016-07-23 ENCOUNTER — Telehealth: Payer: Self-pay | Admitting: *Deleted

## 2016-07-23 NOTE — Telephone Encounter (Signed)
Pt is concerned that she was taking off her BP med by Dr. Nyoka Cowden but is questioning if she should be taking the Maxide? Pt states she doesn't have the bottle but the med is listed on her med list. Please advise

## 2016-07-26 MED ORDER — TRIAMTERENE-HCTZ 37.5-25 MG PO TABS: 1.0000 | ORAL_TABLET | Freq: Every day | ORAL | 0 refills | Status: DC

## 2016-07-26 NOTE — Telephone Encounter (Signed)
Spoke with patient and advised results, pt will start BP med again.

## 2016-07-26 NOTE — Telephone Encounter (Signed)
She should continue the Maxzide. Send in a prescription if she needs it.

## 2016-07-27 ENCOUNTER — Telehealth: Payer: Self-pay

## 2016-07-27 MED ORDER — ALPRAZOLAM 1 MG PO TABS: ORAL_TABLET | ORAL | 0 refills | Status: DC

## 2016-07-27 NOTE — Telephone Encounter (Signed)
Patient calling back to state she gets a qty of 90, 3 per day.  She states she told the nurse 30 and that was not right.  CB is [phone].

## 2016-07-27 NOTE — Telephone Encounter (Signed)
Spoke with pt and made sure she was ok with her medication going to this pharmacy, she states she was and that this is where she is transferring to them. Spoke with pharmacist and gave them pt's rx for the alprazolam.  Nothing further is needed

## 2016-07-27 NOTE — Telephone Encounter (Signed)
[facility] requesting Alprazolam 0.'5mg'$  sent to their pharmacy. She is trying to transfer pharmacies but they are unable to transfer because it is a controlled medication. Ok to send. RB please advise.

## 2016-07-27 NOTE — Telephone Encounter (Signed)
Yes Ok to send. Thanks.

## 2016-07-27 NOTE — Telephone Encounter (Signed)
Spoke with pt and informed her that her previous rx states 90 tablets so that is what I called in. She had no further questions. Nothing further is needed

## 2016-07-28 ENCOUNTER — Encounter: Payer: Self-pay | Admitting: Family

## 2016-07-30 ENCOUNTER — Telehealth: Payer: Self-pay | Admitting: Emergency Medicine

## 2016-07-30 NOTE — Telephone Encounter (Signed)
Noted. Nothing further needed. 

## 2016-07-30 NOTE — Telephone Encounter (Signed)
atc pt, no answer and no vm. RB please advise if pt can be worked in sooner than June.  Thanks!

## 2016-07-30 NOTE — Telephone Encounter (Signed)
RB had a cancellation on 4/26 @ 9:45 I put her in on that time.Hillery Hunter

## 2016-08-03 ENCOUNTER — Ambulatory Visit: Payer: Medicare Other | Admitting: Emergency Medicine

## 2016-08-04 ENCOUNTER — Ambulatory Visit (HOSPITAL_COMMUNITY)
Admission: RE | Admit: 2016-08-04 | Discharge: 2016-08-04 | Disposition: A | Discharge status: Home / Self Care | Payer: Medicare Other | Source: Ambulatory Visit | Attending: Family | Admitting: Family

## 2016-08-04 ENCOUNTER — Ambulatory Visit (INDEPENDENT_AMBULATORY_CARE_PROVIDER_SITE_OTHER): Payer: Medicare Other | Admitting: Family

## 2016-08-04 ENCOUNTER — Encounter: Payer: Self-pay | Admitting: Family

## 2016-08-04 VITALS — BP 147/72 | HR 94 | Temp 99.1°F | Resp 20 | Ht 66.0 in | Wt 172.0 lb

## 2016-08-04 DIAGNOSIS — I779 Disorder of arteries and arterioles, unspecified: Secondary | ICD-10-CM | POA: Diagnosis not present

## 2016-08-04 DIAGNOSIS — I739 Peripheral vascular disease, unspecified: Secondary | ICD-10-CM | POA: Insufficient documentation

## 2016-08-04 DIAGNOSIS — M79652 Pain in left thigh: Secondary | ICD-10-CM | POA: Diagnosis not present

## 2016-08-04 DIAGNOSIS — Z87891 Personal history of nicotine dependence: Secondary | ICD-10-CM | POA: Diagnosis not present

## 2016-08-04 NOTE — Progress Notes (Addendum)
[facility]    CC: Follow up peripheral artery occlusive disease  History of Present Illness [name] is a 71 y.o. female patient of Dr. Oneida Alar who returns for followup today regarding peripheral arterial disease. She has been followed since 2011. She was originally seen and found to have mild to moderate bilateral peripheral arterial disease. She has a known left common femoral stenosis and a right common iliac stenosis. This was seen on prior CT Angio. She complains of numbness and tingling from the knee down to the foot bilaterally at times. She does not really describe claudication symptoms. However she is overall fairly inactive, sitting in recliner most of the day. She is on continuous home oxygen. She has now developed recurrent lung cancer and has recently had radiation and chemotherapy treatments. She is also noted that she bruises easy. She denies any lower extremity swelling.   She had a left total hip replacement several years ago. She does not describe rest pain in the feet. Chronic medical problems include hypertension, hyperlipidemia, coronary artery disease, lung cancer, diabetes, atrial fibrillation, COPD. These are all currently stable.  Her walking is limited by dyspnea knee pain, not claudication symptoms, denies non healing wounds. Her lower legs have felt numb since about 2014.  She elevates her ankles above her heart when in her recliner and does leg exercises daily while sitting. She states that she has known fibromyalgia.   She gets a CT scan every 4 months for lung cancer follow up Has gallstones, has occasional pain from her stomach ulcer, post prandial nausea and diarrhea at tmes.  She reports bilateral upper medial thigh pain, left worse than right, for the last 6 months, is not worsening, not improving. She feels this more at night. She has baseline dyspnea for COPD and lung cancer, but states her dyspnea might have increased  slightly lately.   Pt Diabetic: Yes, states in good control, last A1C result on file was Sept. 2015 at 5.7 Pt smoker: former smoker, quit in 2000  Pt meds include: Statin :yes ASA: Yes, 325 mg daily; was advised to decrease to 81 mg enteric coated daily due to her stomach ulcer Other anticoagulants/antiplatelets: no   Past Medical History:  Diagnosis Date  . Acute posthemorrhagic anemia   . Acute sinusitis, unspecified   . Acute upper respiratory infections of unspecified site   . Adenocarcinoma, lung ([place])    upper left lobe  . Anemia in neoplastic disease   . Anemia, unspecified   . Anginal pain ([place])   . Anxiety   . Anxiety state, unspecified   . Arthritis    "all over my body"  . Asthma   . Asymptomatic varicose veins   . Atherosclerosis of native arteries of the extremities, unspecified   . Atrial fibrillation ([place])   . Bronchial pneumonia    history  . 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 Chemotherapy adverse reaction 09/15/11   "makes me light headed and pass out; this is 3rd blood transfusion since going thru it"  . CHF (congestive heart failure) ([place])   . Chronic airway obstruction, not elsewhere classified   . Chronic fatigue 08/19/2015  . Chronic sinusitis   . Complication of anesthesia    slow to wake up  . COPD (chronic obstructive pulmonary disease) ([place])   . Coronary artery disease    Dr. Burt Knack had stress test done  . DDD (degenerative disc disease), lumbar   . Depressive  disorder, not elsewhere classified   . Diseases of lips   . Disorder of bone and cartilage, unspecified   . Dizziness and giddiness   . DVT (deep venous thrombosis) ([place])   . Dysrhythmia    irregular  . Effects of radiation, unspecified   . Emphysema   . Encounter for antineoplastic immunotherapy 08/19/2015  . Fibromyalgia   . Functional urinary incontinence   . GERD (gastroesophageal reflux disease)   . Hard of hearing, right   . Heart murmur   . Herpes zoster without  mention of complication   . Hip pain, chronic 09/17/2014  . History of blood clots    "2 in my aorta"  . History of blood transfusion   . History of bronchitis   . Hx of radiation therapy 04/28/11 -06/08/11   right upper lobe-lung  . Hyperlipidemia   . Hyperpotassemia   . Hypertension   . Hypopotassemia   . Kidney infection    "related to chemo"  . Lumbago   . Malignant neoplasm of bronchus and lung, unspecified site   . Mental disorder   . Muscle weakness (generalized)   . Muscle weakness (generalized)   . Non-small cell lung cancer ([place]) 03/31/2011   Adenocarcinoma Rt upper lobe mass  . Osteoarthrosis, unspecified whether generalized or localized, unspecified site   . Osteoporosis   . Other abnormal blood chemistry   . Other and unspecified hyperlipidemia   . PAD (peripheral artery disease) ([place])   . Pain in thoracic spine   . Peripheral vascular disease ([place])   . 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 Primary cancer of right upper lobe of lung ([place]) 02/08/2011   Biopsy Rt upper lobe mass 03/31/11>>adenocarcinoma DIAGNOSIS: Stage IIB/IIIA non-small cell lung cancer consistent with adenocarcinoma diagnosed in December 2012.   PRIOR THERAPY:  1) Status post radiation therapy to the right upper lobe lung mass completed on 06/04/2011 under the care of Dr. Pablo Ledger.  2) Systemic chemotherapy with carboplatin for AUC of 5 and Alimta 500 mg/M2 giving every 3   . Reflux   . Reflux esophagitis   . Respiratory failure with hypoxia 01/02/2010  . Restless legs syndrome (RLS)   . Scoliosis   . Screening for thyroid disorder   . Shortness of breath    "anytime really"  . Sinus headache    "all the time"  . Spasm of muscle   . Syncope and collapse   . Type II or unspecified type diabetes mellitus with neurological manifestations, uncontrolled(250.62)   . Type II or unspecified type diabetes mellitus without mention of complication, not stated as uncontrolled   . Type II or unspecified  type diabetes mellitus without mention of complication, not stated as uncontrolled   . Unspecified constipation   . Unspecified essential hypertension   . Unspecified hearing loss   . Unspecified hereditary and idiopathic peripheral neuropathy   . Unspecified vitamin D deficiency   . Urinary tract infection, site not specified     Social History Social History  Substance Use Topics  . Smoking status: Former Smoker    Packs/day: 1.00    Years: 40.00    Types: Cigarettes    Quit date: 02/10/1998  . Smokeless tobacco: Never Used  . Alcohol use No    Family History Family History  Problem Relation Age of Onset  . Bone cancer Father   . Cancer Father     Bone  . Diabetes Mother   . Deep vein thrombosis Mother   .  Heart disease Mother     Heart Disease before age 36-  PVD  . Hyperlipidemia Mother   . Hypertension Mother   . Varicose Veins Mother   . Arthritis Daughter     Past Surgical History:  Procedure Laterality Date  . BRONCHOSCOPY  02/2011  . CARDIAC CATHETERIZATION    . CATARACT EXTRACTION  03/12/2013   Left Eye   . DILATION AND CURETTAGE OF UTERUS  1980's  . EYE SURGERY Left 2014   Cataract  . FETAL BLOOD TRANSFUSION  09/16/11  . JOINT REPLACEMENT  04/13/10   Left Hip  . LUNG LOBECTOMY  1986   left  . SEPTOPLASTY    . TOTAL HIP ARTHROPLASTY  01/2010   left  . TUBAL LIGATION  1980's    Allergies  Allergen Reactions  . Iohexol Shortness Of Breath and Other (See Comments)    "burns me up inside"    Pt does fine with 13 hour prep  01/17/12  . Other     CAN'T STAND THE SMELL OF PURE BLEACH; "HAVE TO BACK UP AND POUR AND DON'T BREATH IT TIL i GET CAP BACK ON; DILUTE IT TO SPRAY"  . Anoro Ellipta [Umeclidinium-Vilanterol] Swelling    Swelling of lips and mouth  . Codeine Itching and Nausea And Vomiting    REACTION: GI upset  . Protonix [Pantoprazole Sodium] Diarrhea    Current Outpatient Prescriptions  Medication Sig Dispense Refill  . ACCU-CHEK SOFTCLIX  LANCETS lancets by Other route. Check blood sugar twice daily. Dx: E11.9, E11.49    . ALPRAZolam (XANAX) 1 MG tablet TAKE 1 TABLET BY MOUTH THREE TIMES A DAY AS NEEDED FOR ANXIETY 90 tablet 0  . aspirin 81 MG tablet Take 81 mg by mouth daily.    . Blood Glucose Monitoring Suppl (ACCU-CHEK AVIVA PLUS) w/Device KIT Use as directed to check blood sugar Dx: E11.9, E11.49 1 kit 0  . cetirizine (ZYRTEC) 10 MG tablet Take 10 mg by mouth daily.    . citalopram (CELEXA) 20 MG tablet TAKE 1 TABLET BY MOUTH EVERY DAY TO HELP MOOD 90 tablet 1  . fluticasone (FLONASE) 50 MCG/ACT nasal spray INSTILL 2 SPRAYS IN EACH NOSTRIL TWICE DAILY *NEEDS APPOINTMENT* 16 g 5  . HYDROcodone-acetaminophen (NORCO) 10-325 MG tablet Take One tablet by mouth every 6 hours if needed for pain 120 tablet 0  . Ipratropium-Albuterol (COMBIVENT RESPIMAT) 20-100 MCG/ACT AERS respimat Inhale 2 puffs into the lungs 4 (four) times daily. 2 Inhaler 5  . loratadine-pseudoephedrine (CLARITIN-D 12-HOUR) 5-120 MG tablet One up to twice daily for allergy and congestion 60 tablet 5  . metFORMIN (GLUCOPHAGE) 500 MG tablet Take 1 tablet (500 mg total) by mouth daily as needed (high blood sugar- 160+). Take one tablet at night for blood sugar 60 tablet 5  . Multiple Vitamins-Minerals (CENTRUM SILVER PO) Take 1 tablet by mouth daily. Patient unsure of dose    . potassium chloride (K-DUR,KLOR-CON) 10 MEQ tablet TAKE 1 TABLET BY MOUTH EVERY DAY 30 tablet 1  . ranitidine (ZANTAC) 300 MG tablet TAKE 1 TABLET BY MOUTH EVERY NIGHT AT BEDTIME 30 tablet 3  . traMADol (ULTRAM) 50 MG tablet TAKE 1 TABLET BY MOUTH THREE TIMES A DAY AS NEEDED FOR PAIN 90 tablet 1  . triamterene-hydrochlorothiazide (MAXZIDE-25) 37.5-25 MG tablet Take 1 tablet by mouth daily. 90 tablet 0   No current facility-administered medications for this visit.    Facility-Administered Medications Ordered in Other Visits  Medication Dose Route Frequency Provider  Last Rate Last Dose  .  hyaluronate sodium (RADIAPLEXRX) gel   Topical Once Thea Silversmith, MD      . topical emolient (BIAFINE) emulsion   Topical PRN Thea Silversmith, MD        ROS: See HPI for pertinent positives and negatives.   Physical Examination  Vitals:   08/04/16 1511  BP: (!) 147/72  Pulse: 94  Resp: 20  Temp: 99.1 F (37.3 C)  TempSrc: Oral  SpO2: 97%  Weight: 172 lb (78 kg)  Height: 5' 6"  (1.676 m)   Body mass index is 27.76 kg/m.  General: A&O x 3, WDWN. Gait: using cane, slow, steady Eyes: pupils equal Pulmonary: Respirations are not labored at rest, wearing nasal canula attached to supplemental oxygen, fairly good air movement, no wheezes, rales, or rhonchi. Cardiac: regular rhythm, no detected murmur.     Carotid Bruits Left Right   Negative Negative  Aorta is not palpable. Radial pulses: are 2+ palpable and =   VASCULAR EXAM: Extremitieswithout ischemic changes  without Gangrene; without open wounds.     LE Pulses LEFT RIGHT   FEMORAL not palpable 2+ palpable    POPLITEAL not palpable  not palpable   POSTERIOR TIBIAL not palpable  not palpable    DORSALIS PEDIS  ANTERIOR TIBIAL Faintly palpable  faintly palpable    Abdomen: soft, NT, no palpable masses. Skin: no rashes, no ulcers. Musculoskeletal: no muscle wasting or atrophy. No peripheral edema.Tender to touch in multiple areas palpated including medial aspects of both upper thighs, worse on the left upper thigh.  Neurologic: A&O X 3; Appropriate Affect,; MOTOR FUNCTION: moving all extremities equally, motor strength 4/5 throughout. Speech is fluent/normal. CN 2-12 grossly intact.    ASSESSMENT: [name] is a 71 y.o. female who presents for follow up of her mild peripheral artery  disease. She has a known left common femoral stenosis and a right common iliac stenosis. This was seen on prior CT Angio. She does not seem to describe claudication symptoms, she has no signs of ischemia or tissue loss in her lower extremities.  She has bilateral upper thigh pain, left worse than right, x 6 months with slightly worse dyspnea than her baseline COPD and lung cancer. She uses supplemental oxygen by portable O2 concentrator.  The pain seems to be worse at night, she does not indicate claudication symptoms with walking. There are no signs of ischemia a in her feet/legs.  There is no swelling in her legs. She has known fibromyalgia.   I discussed with Dr. Scot Dock whether DVT duplex should be done to rule this out; low suspicion for DVT: there is a prominent and taught muscle at upper anterior thigh which is the most tender area to palpation. DVT should be ruled out, but her insurance will not cover this today since an arterial study was done today.  Discussed with Velda Shell; Juliann Pulse will perform the study tomorrow morning at 8 am, pt agreed. I advised pt to call 911 if her breathing worsens.   ABI (08-04-16): Right: 0.88 (0.82 on 11-06-15), waveforms: biphasic; TBI: 0.47 (was 0.49 on 11-06-15) Left: 0.92 (0.77 on 11-06-15), waveforms: biphasic; TBI: 0.41 (was 0.47) ABI improved slightly on the right, and significant improvement in the left lower extremity.   Venous Duplex (08-05-16): No evidence of DVT or superficial thrombus identified in the left leg.  (I left a message on patient's voicemail of the above results)  PLAN:  Graduated walking program in a safe environment.  Daily seated leg exercises discussed and demonstrated.  Based on the patient's vascular studies and examination, and after discussing with Dr. Oneida Alar, pt will return to clinic in 9 months with ABI's, tomorrow at 8 am for DVT duplex of left upper thigh.   I discussed in depth with the patient the nature of  atherosclerosis, and emphasized the importance of maximal medical management including strict control of blood pressure, blood glucose, and lipid levels, obtaining regular exercise, and continued cessation of smoking.  The patient is aware that without maximal medical management the underlying atherosclerotic disease process will progress, limiting the benefit of any interventions.  The patient was given information about PAD including signs, symptoms, treatment, what symptoms should prompt the patient to seek immediate medical care, and risk reduction measures to take.  Clemon Chambers, RN, MSN, FNP-C [facility] [employer] Phone: [phone]  Clinic MD: Scot Dock  08/04/16 3:47 PM

## 2016-08-04 NOTE — Patient Instructions (Signed)

## 2016-08-05 ENCOUNTER — Telehealth: Payer: Self-pay | Admitting: Medical Oncology

## 2016-08-05 ENCOUNTER — Ambulatory Visit (HOSPITAL_COMMUNITY)
Admission: RE | Admit: 2016-08-05 | Discharge: 2016-08-05 | Disposition: A | Discharge status: Home / Self Care | Payer: Medicare Other | Source: Ambulatory Visit | Attending: Family | Admitting: Family

## 2016-08-05 ENCOUNTER — Encounter: Payer: Self-pay | Admitting: Emergency Medicine

## 2016-08-05 ENCOUNTER — Ambulatory Visit (INDEPENDENT_AMBULATORY_CARE_PROVIDER_SITE_OTHER): Payer: Medicare Other | Admitting: Emergency Medicine

## 2016-08-05 DIAGNOSIS — J320 Chronic maxillary sinusitis: Secondary | ICD-10-CM | POA: Diagnosis not present

## 2016-08-05 DIAGNOSIS — I779 Disorder of arteries and arterioles, unspecified: Secondary | ICD-10-CM

## 2016-08-05 DIAGNOSIS — J449 Chronic obstructive pulmonary disease, unspecified: Secondary | ICD-10-CM | POA: Diagnosis not present

## 2016-08-05 DIAGNOSIS — C3411 Malignant neoplasm of upper lobe, right bronchus or lung: Secondary | ICD-10-CM

## 2016-08-05 DIAGNOSIS — M79652 Pain in left thigh: Secondary | ICD-10-CM | POA: Insufficient documentation

## 2016-08-05 MED ORDER — ALBUTEROL SULFATE (2.5 MG/3ML) 0.083% IN NEBU
2.5000 mg | INHALATION_SOLUTION | Freq: Four times a day (QID) | RESPIRATORY_TRACT | 5 refills | Status: DC | PRN
Start: 2016-08-05 — End: 2017-08-02

## 2016-08-05 MED ORDER — IPRATROPIUM-ALBUTEROL 20-100 MCG/ACT IN AERS: 2.0000 | INHALATION_SPRAY | Freq: Four times a day (QID) | RESPIRATORY_TRACT | 5 refills | Status: DC

## 2016-08-05 NOTE — Assessment & Plan Note (Signed)
Please continue Combivent 4 times a day Continue your oxygen as you have been using it Follow with Dr Lamonte Sakai in 3 months or sooner if you have any problems.

## 2016-08-05 NOTE — Addendum Note (Signed)
Addended by: Jannette Spanner on: 08/05/2016 10:35 AM   Modules accepted: Orders

## 2016-08-05 NOTE — Assessment & Plan Note (Signed)
She is anxious to restart her immunotherapy. Based on my discussions with [name] I believe that he is willing to do this. I asked her to discuss it with him

## 2016-08-05 NOTE — Assessment & Plan Note (Signed)
Continue Zyrtec, Flonase Asked her to  try using symptomatic relief temporarily such as TheraFlu or Tylenol cold and flu.

## 2016-08-05 NOTE — Telephone Encounter (Signed)
"  Dr Lamonte Sakai gave me a clean bill of health so I can start treatments and I want to start them soon.

## 2016-08-05 NOTE — Progress Notes (Signed)
Subjective:    Patient ID: [name], female    DOB: 09/04/1945, 71 y.o.   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 She has been managed on Nivolumab. She underwent an MRI brain 01/23/16 that did not show any evidence of metastatic disease.  We tried changing her to [facility], took it for 2 weeks but had more chest tightness, more dyspnea. Went back to [place], currently using 2 puffs bid. She is having some exertional SOB, difficulty with any activity, showering. Wears O2 at 4L/min usually. She has a lot of cough, produces sputum. She is on loratadine alternated w zyrtec-d, and fluticasone qd.   ROV 06/09/16 -- Patient has a history of stage IIIa adenocarcinoma of the right upper lobe and severe COPD. She has been treated with right upper lobe radiation therapy, chemotherapy in 2013 and then again in 2016, then with immunotherapy (Nivolumab), stopped . Most recent CT scan of the chest was done on 06/02/16 that I have personally reviewed. This shows an increase in size in her mid lung groundglass lesion associated with some scattered satellite groundglass nodules. She has been treated recently with doxycycline and then with augmentin, still taking it. Not sure that it has helped her. She has had nasal gtt, cough for the last 3-4 weeks. Sputum has been dark at times.  She is on combivent 4x a day. She is not currently on loratadine, she uses flonase daily. She is using mucinex DM qd.   ROV 08/05/16 -- This is a follow-up visit for severe COPD. 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 She has an enlarging right upper lobe groundglass area and has been in consultation with Dr. [name] regarding timing of reinitiation of her immunotherapy. She is currently managed on combivent, flonase. She reports that she had increase dyspnea last week, some increased  wheeze. We treated her with a course of prednisone, finished it last Saturday. She has rebounded some this week. She is taking zyrtec, flonase nasal spray.      Objective:   Physical Exam Vitals:   08/05/16 0940  BP: 122/60  Pulse: 96  SpO2: 99%  Weight: 168 lb 9.6 oz (76.5 kg)  Height: '5\' 6"'$  (1.676 m)   Gen: Pleasant, elderly woman, in no distress,  normal affect  ENT: No lesions,  mouth clear,  oropharynx clear, no postnasal drip  Neck: No JVD, no TMG, no carotid bruits  Lungs: No use of accessory muscles, distant, no wheeze  Cardiovascular: RRR, heart sounds normal, no murmur or gallops, no peripheral edema  Musculoskeletal: No deformities, no cyanosis or clubbing.   Neuro: alert, non focal  Skin: Warm, no lesions or rashes   CBC Latest Ref Rng & Units 06/25/2016 06/02/2016 03/31/2016  WBC 3.9 - 10.3 10e3/uL 8.7 7.8 9.8  Hemoglobin 11.6 - 15.9 g/dL 11.7 11.0(L) 12.4  Hematocrit 34.8 - 46.6 % 36.6 33.5(L) 37.7  Platelets 145 - 400 10e3/uL 277 298 272   BMET    Component Value Date/Time   NA 140 06/25/2016 0923   K 4.1 06/25/2016 0923   CL 95 (L) 03/08/2016 1717   CL 99 07/21/2012 1125   CO2 26 06/25/2016 0923   GLUCOSE 88 06/25/2016 0923   GLUCOSE 184 (H) 07/21/2012 1125   BUN 15.5 06/25/2016 0923   CREATININE 1.0 06/25/2016 0923   CALCIUM 9.9 06/25/2016  [place] (L) 08/06/2015 1036   GFRAA 48 (L) 08/06/2015 1036    CT chest 10/27/15 COMPARISON:  08/12/2015  FINDINGS: Cardiovascular: Aortic and branch vessel atherosclerosis. Normal heart size with minimal anterior pericardial fluid or thickening, similar. Multivessel coronary artery atherosclerosis.  Mediastinum/Nodes: No mediastinal or definite hilar adenopathy, given limitations of unenhanced CT. Esophageal fluid level.  Lungs/Pleura: Small right-sided pleural effusion is similar. Right-sided pleural based calcification is adjacent to healing right rib fractures and likely dystrophic. No  left-sided pleural fluid.  Left upper lobectomy. Narrowing of left upper lobe bronchi secondary to volume loss, similar.  Moderate centrilobular emphysema. Right upper or middle lobe ground-glass opacity with septal thickening. This measures on the order of 8.4 x 5.9 cm today versus 8.8 x 5.2 cm previously. Slightly more well-defined, with greater ground-glass density today. 2.6 cm on sagittal image 37 in craniocaudal dimension. Similar.  A separate more inferior and lateral right upper lobe pulmonary nodule measures 4 mm on image 75/series 5 and is new. Also sagittal image 31.  Posterior right upper lobe consolidation is likely treatment related and similar. Concurrent volume loss.  Left apical ground-glass nodule measures 6 mm on image 28/series 5 and is either new or more apparent today.  A left lower lobe sub solid pulmonary nodule is unchanged at 7 mm on image 62/series 5  3 mm right lower lobe pulmonary nodule is similar on image 95/series 5.  medial right middle lobe area of probable mucous plugging is similar on image 78/series 5.  Upper Abdomen: Mild hepatic steatosis. Gallstones on the order of 6 mm. Normal imaged portions of the spleen, stomach, pancreas, kidneys. Low-density bilateral adrenal nodules are similar and likely due to underlying adenomas. Abdominal aortic atherosclerosis.  Musculoskeletal: Postsurgical or posttraumatic right-sided rib defects. Prior vertebral augmentation at T4. T5 mild to moderate compression deformity is similar.  IMPRESSION: 1. The dominant right-sided ground-glass opacity measures similar in size but is slightly more well-defined today, with increased density within. 2. An adjacent satellite 4 mm nodule is new and suspicious for progressive disease. Similarly, a left apical ground-glass nodule is new or increased and suspicious for progressive disease. 3. No thoracic adenopathy. 4. Other pulmonary lesions are  similar. 5. Similar volume loss within the posterior right upper lobe with prior left upper lobectomy. 6. Similar small right pleural effusion. 7. Hepatic steatosis. 8.  Coronary artery atherosclerosis. Aortic atherosclerosis. 9. Cholelithiasis. 10. Esophageal air fluid level suggests dysmotility or gastroesophageal reflux.      Assessment & Plan:  COPD (chronic obstructive pulmonary disease) ([place]) Please continue Combivent 4 times a day Continue your oxygen as you have been using it Follow with Dr Lamonte Sakai in 3 months or sooner if you have any problems.  Sinusitis, chronic Continue Zyrtec, Flonase Asked her to  try using symptomatic relief temporarily such as TheraFlu or Tylenol cold and flu.  Primary cancer of right upper lobe of lung ([place]) She is anxious to restart her immunotherapy. Based on my discussions with [name] I believe that he is willing to do this. I asked her to discuss it with him  Baltazar Apo, MD, PhD 08/05/2016, 10:14 AM [facility] [phone] or if no answer [phone]

## 2016-08-05 NOTE — Patient Instructions (Signed)
Please continue Combivent 4 times a day Continue your oxygen as you have been using it Continue Zyrtec daily, fluticasone nasal spray daily Communicate with [name] regarding the timing of reinitiation of your immunotherapy Follow with Dr Lamonte Sakai in 3 months or sooner if you have any problems.

## 2016-08-06 ENCOUNTER — Telehealth: Payer: Self-pay | Admitting: Emergency Medicine

## 2016-08-06 ENCOUNTER — Telehealth: Payer: Self-pay | Admitting: Medical Oncology

## 2016-08-06 MED ORDER — IPRATROPIUM-ALBUTEROL 20-100 MCG/ACT IN AERS: 2.0000 | INHALATION_SPRAY | Freq: Four times a day (QID) | RESPIRATORY_TRACT | 5 refills | Status: DC

## 2016-08-06 NOTE — Telephone Encounter (Signed)
Pt requesting combivent to be sent to preferred pharmacy.  This has been sent.  Nothing further needed.

## 2016-08-06 NOTE — Telephone Encounter (Signed)
Pt anxious about getting infusion starting before end of May . I told her Julien Nordmann will be back in office in Monday. "More pain in chest so I got to get started on this treatment"

## 2016-08-08 NOTE — Telephone Encounter (Signed)
OK to do her scan this week and see her few days after the scan.

## 2016-08-10 ENCOUNTER — Ambulatory Visit
Admission: RE | Admit: 2016-08-10 | Discharge: 2016-08-10 | Disposition: A | Discharge status: Home / Self Care | Payer: Medicare Other | Source: Ambulatory Visit | Attending: Internal Medicine | Admitting: Internal Medicine

## 2016-08-10 ENCOUNTER — Other Ambulatory Visit: Payer: Self-pay | Admitting: Internal Medicine

## 2016-08-10 ENCOUNTER — Telehealth: Payer: Self-pay | Admitting: *Deleted

## 2016-08-10 DIAGNOSIS — M810 Age-related osteoporosis without current pathological fracture: Secondary | ICD-10-CM | POA: Diagnosis not present

## 2016-08-10 DIAGNOSIS — Z780 Asymptomatic menopausal state: Secondary | ICD-10-CM | POA: Diagnosis not present

## 2016-08-10 DIAGNOSIS — Z1231 Encounter for screening mammogram for malignant neoplasm of breast: Secondary | ICD-10-CM | POA: Diagnosis not present

## 2016-08-10 DIAGNOSIS — E2839 Other primary ovarian failure: Secondary | ICD-10-CM

## 2016-08-10 DIAGNOSIS — Z1239 Encounter for other screening for malignant neoplasm of breast: Secondary | ICD-10-CM

## 2016-08-10 NOTE — Telephone Encounter (Signed)
Called and notified pt CT scan has been authorized. Gave pt # advised pt to call and schedule appt then call office and we will try to move the MD appt up sooner. Pt verbalized understanding.

## 2016-08-11 ENCOUNTER — Other Ambulatory Visit: Payer: Self-pay | Admitting: *Deleted

## 2016-08-11 DIAGNOSIS — M25552 Pain in left hip: Secondary | ICD-10-CM

## 2016-08-11 DIAGNOSIS — G8929 Other chronic pain: Secondary | ICD-10-CM

## 2016-08-11 DIAGNOSIS — G44229 Chronic tension-type headache, not intractable: Secondary | ICD-10-CM

## 2016-08-11 DIAGNOSIS — M5136 Other intervertebral disc degeneration, lumbar region: Secondary | ICD-10-CM

## 2016-08-11 MED ORDER — HYDROCODONE-ACETAMINOPHEN 10-325 MG PO TABS: ORAL_TABLET | ORAL | 0 refills | Status: DC

## 2016-08-11 NOTE — Telephone Encounter (Signed)
Patient requested and will pick up on Friday

## 2016-08-18 ENCOUNTER — Telehealth: Payer: Self-pay

## 2016-08-18 NOTE — Telephone Encounter (Signed)
Call prescription for doxycycline 100 mg (20) One twice daily for infection.

## 2016-08-18 NOTE — Telephone Encounter (Signed)
Patient complains of feeling "stopped up" headache, ear drainage, facial swelling, and eye swelling, she has tried Claritin and nasal spray with little relief. Started three weeks ago. Patient is requesting an antibiotic please advise.

## 2016-08-19 MED ORDER — DOXYCYCLINE HYCLATE 100 MG PO CAPS: 100.0000 mg | ORAL_CAPSULE | Freq: Two times a day (BID) | ORAL | 0 refills | Status: DC

## 2016-08-19 NOTE — Telephone Encounter (Signed)
Spoke with patient to inform her that meds was sent to pharmacy.

## 2016-08-20 ENCOUNTER — Telehealth: Payer: Self-pay

## 2016-08-20 NOTE — Telephone Encounter (Signed)
Spoke with patient she stated that she has gallstones and on some days she wakes up with really bad stomach aches and wanted to know is there anything she could take td calm her stomach. Patients also said that Dr. Nyoka Cowden do not recommend surgery in her case. Please advise.

## 2016-08-20 NOTE — Telephone Encounter (Signed)
Yes there is not going to be a medication to help other than pain medication.

## 2016-08-23 ENCOUNTER — Telehealth: Payer: Self-pay | Admitting: Internal Medicine

## 2016-08-23 NOTE — Telephone Encounter (Signed)
If patient is able to come in for an appt, then yes.  If she is too weak to leave her home, then no b/c it would not be easy for her.

## 2016-08-23 NOTE — Telephone Encounter (Signed)
Manus Gunning  With Hospice called, stated [facility] has recommended pallative [facility]...Marland KitchenMarland Kitchenthey would like to know if patient is approved for the program would PCP continue to follow patient?  [name] has an appointment scheduled with Dr. Mariea Clonts on May 31th.  [facility] will send a letter to her attn prior to this appointment.  This letter can be used for a response from Dr. Mariea Clonts.  [name] was. Is a patient of Dr. Nyoka Cowden...cdavis

## 2016-08-24 NOTE — Telephone Encounter (Signed)
Patient notified and stated she will discuss at her next appointment.

## 2016-08-24 NOTE — Telephone Encounter (Signed)
Left vm for patient to return call to office.

## 2016-08-31 ENCOUNTER — Other Ambulatory Visit (HOSPITAL_BASED_OUTPATIENT_CLINIC_OR_DEPARTMENT_OTHER): Payer: Medicare Other

## 2016-08-31 ENCOUNTER — Ambulatory Visit (HOSPITAL_COMMUNITY)
Admission: RE | Admit: 2016-08-31 | Discharge: 2016-08-31 | Disposition: A | Discharge status: Home / Self Care | Payer: Medicare Other | Source: Ambulatory Visit | Attending: Internal Medicine | Admitting: Internal Medicine

## 2016-08-31 DIAGNOSIS — C3411 Malignant neoplasm of upper lobe, right bronchus or lung: Secondary | ICD-10-CM | POA: Insufficient documentation

## 2016-08-31 DIAGNOSIS — C3491 Malignant neoplasm of unspecified part of right bronchus or lung: Secondary | ICD-10-CM | POA: Diagnosis not present

## 2016-08-31 DIAGNOSIS — R0602 Shortness of breath: Secondary | ICD-10-CM

## 2016-08-31 DIAGNOSIS — J90 Pleural effusion, not elsewhere classified: Secondary | ICD-10-CM | POA: Insufficient documentation

## 2016-08-31 DIAGNOSIS — R531 Weakness: Secondary | ICD-10-CM | POA: Diagnosis not present

## 2016-08-31 DIAGNOSIS — C3492 Malignant neoplasm of unspecified part of left bronchus or lung: Secondary | ICD-10-CM | POA: Diagnosis not present

## 2016-08-31 DIAGNOSIS — D3502 Benign neoplasm of left adrenal gland: Secondary | ICD-10-CM | POA: Diagnosis not present

## 2016-08-31 DIAGNOSIS — R918 Other nonspecific abnormal finding of lung field: Secondary | ICD-10-CM | POA: Diagnosis not present

## 2016-08-31 DIAGNOSIS — K8020 Calculus of gallbladder without cholecystitis without obstruction: Secondary | ICD-10-CM | POA: Diagnosis not present

## 2016-08-31 DIAGNOSIS — D3501 Benign neoplasm of right adrenal gland: Secondary | ICD-10-CM | POA: Diagnosis not present

## 2016-08-31 DIAGNOSIS — J449 Chronic obstructive pulmonary disease, unspecified: Secondary | ICD-10-CM | POA: Insufficient documentation

## 2016-08-31 DIAGNOSIS — Z9889 Other specified postprocedural states: Secondary | ICD-10-CM | POA: Diagnosis not present

## 2016-08-31 LAB — CBC WITH DIFFERENTIAL/PLATELET
BASO%: 0.7 % (ref 0.0–2.0)
Basophils Absolute: 0.1 10*3/uL (ref 0.0–0.1)
EOS%: 5.8 % (ref 0.0–7.0)
Eosinophils Absolute: 0.5 10*3/uL (ref 0.0–0.5)
HEMATOCRIT: 37.5 % (ref 34.8–46.6)
HGB: 12.6 g/dL (ref 11.6–15.9)
LYMPH#: 1.9 10*3/uL (ref 0.9–3.3)
LYMPH%: 22.4 % (ref 14.0–49.7)
MCH: 30.2 pg (ref 25.1–34.0)
MCHC: 33.5 g/dL (ref 31.5–36.0)
MCV: 90.2 fL (ref 79.5–101.0)
MONO#: 0.7 10*3/uL (ref 0.1–0.9)
MONO%: 8.3 % (ref 0.0–14.0)
NEUT#: 5.4 10*3/uL (ref 1.5–6.5)
NEUT%: 62.8 % (ref 38.4–76.8)
Platelets: 206 10*3/uL (ref 145–400)
RBC: 4.16 10*6/uL (ref 3.70–5.45)
RDW: 13.2 % (ref 11.2–14.5)
WBC: 8.6 10*3/uL (ref 3.9–10.3)

## 2016-08-31 LAB — COMPREHENSIVE METABOLIC PANEL
ALT: 13 U/L (ref 0–55)
AST: 18 U/L (ref 5–34)
Albumin: 3.5 g/dL (ref 3.5–5.0)
Alkaline Phosphatase: 91 U/L (ref 40–150)
Anion Gap: 8 mEq/L (ref 3–11)
BUN: 13.6 mg/dL (ref 7.0–26.0)
CALCIUM: 9.8 mg/dL (ref 8.4–10.4)
CHLORIDE: 105 meq/L (ref 98–109)
CO2: 26 meq/L (ref 22–29)
CREATININE: 1 mg/dL (ref 0.6–1.1)
EGFR: 57 mL/min/{1.73_m2} — ABNORMAL LOW (ref >90)
Glucose: 97 mg/dl (ref 70–140)
Potassium: 4.2 mEq/L (ref 3.5–5.1)
Sodium: 140 mEq/L (ref 136–145)
Total Bilirubin: 0.51 mg/dL (ref 0.20–1.20)
Total Protein: 6.1 g/dL — ABNORMAL LOW (ref 6.4–8.3)

## 2016-09-02 ENCOUNTER — Ambulatory Visit: Payer: Medicare Other | Admitting: Internal Medicine

## 2016-09-08 ENCOUNTER — Telehealth: Payer: Self-pay | Admitting: Medical Oncology

## 2016-09-08 ENCOUNTER — Encounter: Payer: Self-pay | Admitting: Internal Medicine

## 2016-09-08 ENCOUNTER — Ambulatory Visit (HOSPITAL_BASED_OUTPATIENT_CLINIC_OR_DEPARTMENT_OTHER): Payer: Medicare Other | Admitting: Internal Medicine

## 2016-09-08 VITALS — BP 158/66 | HR 109 | Temp 98.6°F | Resp 18 | Ht 66.0 in | Wt 174.0 lb

## 2016-09-08 DIAGNOSIS — C3411 Malignant neoplasm of upper lobe, right bronchus or lung: Secondary | ICD-10-CM | POA: Diagnosis not present

## 2016-09-08 DIAGNOSIS — J449 Chronic obstructive pulmonary disease, unspecified: Secondary | ICD-10-CM

## 2016-09-08 DIAGNOSIS — J90 Pleural effusion, not elsewhere classified: Secondary | ICD-10-CM

## 2016-09-08 DIAGNOSIS — Z5112 Encounter for antineoplastic immunotherapy: Secondary | ICD-10-CM

## 2016-09-08 DIAGNOSIS — R0602 Shortness of breath: Secondary | ICD-10-CM

## 2016-09-08 DIAGNOSIS — R5382 Chronic fatigue, unspecified: Secondary | ICD-10-CM

## 2016-09-08 NOTE — Telephone Encounter (Signed)
Left message to call back about appt today at 2 pm. Daughter stated they are in the lobby at registration desk.

## 2016-09-08 NOTE — Progress Notes (Signed)
[place] [phone]) [phone]   [phone]  OFFICE PROGRESS NOTE  Estill Dooms, MD [phone] [place] [address]  DIAGNOSIS: Recurrent non-small cell lung cancer initially diagnosed as a stage IIA adenocarcinoma in December 2012 with no actionable mutations.   PRIOR THERAPY: 1) Status post radiation therapy to the right upper lobe lung mass completed on 06/04/2011 under the care of Dr. Pablo Ledger.  2) Systemic chemotherapy with carboplatin for AUC of 5 and Alimta 500 mg/M2 giving every 3 weeks, status post 3 cycles, last dose was given on 07/21/2011.  3) Systemic chemotherapy with carboplatin for AUC of 5 and Alimta 500 MG/M2 every 3 weeks. First dose on 01/02/2015. Status post 2 cycles, last dose was given 01/30/2015. Her treatment is currently on hold secondary to intolerance. 4) Immunotherapy with Nivolumab 240 M IV every 2 weeks status post 14 cycles.   CURRENT THERAPY: The patient will resume her treatment again with immunotherapy with Nivolumab 240 mg IV every 2 weeks. First dose 09/15/2016  INTERVAL HISTORY: [name] 71 y.o. female returns to the clinic today for follow-up visit accompanied by her daughter [name]. The patient is feeling fine today with no specific complaints except for the baseline shortness of breath secondary to COPD and she is currently on home oxygen. She has intermittent right-sided chest pain. She has no fever or chills. She has dry cough with no hemoptysis. She denied having any weight loss or night sweats. She denied having any fever or chills. She has no nausea, vomiting, diarrhea or constipation. The patient had repeat CT scan of the chest performed recently and she is here for evaluation and discussion of her scan results.  MEDICAL HISTORY: Past Medical History:  Diagnosis Date  . Acute posthemorrhagic anemia   . Acute sinusitis, unspecified   . Acute upper respiratory infections of unspecified site     . Adenocarcinoma, lung ([place])    upper left lobe  . Anemia in neoplastic disease   . Anemia, unspecified   . Anginal pain ([place])   . Anxiety   . Anxiety state, unspecified   . Arthritis    "all over my body"  . Asthma   . Asymptomatic varicose veins   . Atherosclerosis of native arteries of the extremities, unspecified   . Atrial fibrillation ([place])   . Bronchial pneumonia    history  . 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 Chemotherapy adverse reaction 09/15/11   "makes me light headed and pass out; this is 3rd blood transfusion since going thru it"  . CHF (congestive heart failure) ([place])   . Chronic airway obstruction, not elsewhere classified   . Chronic fatigue 08/19/2015  . Chronic sinusitis   . Complication of anesthesia    slow to wake up  . COPD (chronic obstructive pulmonary disease) ([place])   . Coronary artery disease    Dr. Burt Knack had stress test done  . DDD (degenerative disc disease), lumbar   . Depressive disorder, not elsewhere classified   . Diseases of lips   . Disorder of bone and cartilage, unspecified   . Dizziness and giddiness   . DVT (deep venous thrombosis) ([place])   . Dysrhythmia    irregular  . Effects of radiation, unspecified   . Emphysema   . Encounter for antineoplastic immunotherapy 08/19/2015  . Fibromyalgia   . Functional urinary incontinence   . GERD (gastroesophageal reflux disease)   . Hard of  hearing, right   . Heart murmur   . Herpes zoster without mention of complication   . Hip pain, chronic 09/17/2014  . History of blood clots    "2 in my aorta"  . History of blood transfusion   . History of bronchitis   . Hx of radiation therapy 04/28/11 -06/08/11   right upper lobe-lung  . Hyperlipidemia   . Hyperpotassemia   . Hypertension   . Hypopotassemia   . Kidney infection    "related to chemo"  . Lumbago   . Malignant neoplasm of bronchus and lung, unspecified site   . Mental disorder   . Muscle weakness (generalized)   . Muscle  weakness (generalized)   . Non-small cell lung cancer ([place]) 03/31/2011   Adenocarcinoma Rt upper lobe mass  . Osteoarthrosis, unspecified whether generalized or localized, unspecified site   . Osteoporosis   . Other abnormal blood chemistry   . Other and unspecified hyperlipidemia   . PAD (peripheral artery disease) ([place])   . Pain in thoracic spine   . Peripheral vascular disease ([place])   . 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 Primary cancer of right upper lobe of lung ([place]) 02/08/2011   Biopsy Rt upper lobe mass 03/31/11>>adenocarcinoma DIAGNOSIS: Stage IIB/IIIA non-small cell lung cancer consistent with adenocarcinoma diagnosed in December 2012.   PRIOR THERAPY:  1) Status post radiation therapy to the right upper lobe lung mass completed on 06/04/2011 under the care of Dr. Pablo Ledger.  2) Systemic chemotherapy with carboplatin for AUC of 5 and Alimta 500 mg/M2 giving every 3   . Reflux   . Reflux esophagitis   . Respiratory failure with hypoxia 01/02/2010  . Restless legs syndrome (RLS)   . Scoliosis   . Screening for thyroid disorder   . Shortness of breath    "anytime really"  . Sinus headache    "all the time"  . Spasm of muscle   . Syncope and collapse   . Type II or unspecified type diabetes mellitus with neurological manifestations, uncontrolled(250.62)   . Type II or unspecified type diabetes mellitus without mention of complication, not stated as uncontrolled   . Type II or unspecified type diabetes mellitus without mention of complication, not stated as uncontrolled   . Unspecified constipation   . Unspecified essential hypertension   . Unspecified hearing loss   . Unspecified hereditary and idiopathic peripheral neuropathy   . Unspecified vitamin D deficiency   . Urinary tract infection, site not specified     ALLERGIES:  is allergic to iohexol; other; anoro ellipta [umeclidinium-vilanterol]; codeine; and protonix [pantoprazole sodium].  MEDICATIONS:  Current  Outpatient Prescriptions  Medication Sig Dispense Refill  . ACCU-CHEK SOFTCLIX LANCETS lancets by Other route. Check blood sugar twice daily. Dx: E11.9, E11.49    . albuterol (PROVENTIL) (2.5 MG/3ML) 0.083% nebulizer solution Take 3 mLs (2.5 mg total) by nebulization every 6 (six) hours as needed for wheezing or shortness of breath. 75 mL 5  . ALPRAZolam (XANAX) 1 MG tablet TAKE 1 TABLET BY MOUTH THREE TIMES A DAY AS NEEDED FOR ANXIETY 90 tablet 0  . aspirin 81 MG tablet Take 81 mg by mouth daily.    . Blood Glucose Monitoring Suppl (ACCU-CHEK AVIVA PLUS) w/Device KIT Use as directed to check blood sugar Dx: E11.9, E11.49 1 kit 0  . Cetirizine HCl 10 MG CAPS Take by mouth.    . doxycycline (VIBRAMYCIN) 100 MG capsule Take 1 capsule (100 mg total) by mouth 2 (two)  times daily. 20 capsule 0  . fluticasone (FLONASE) 50 MCG/ACT nasal spray INSTILL 2 SPRAYS IN EACH NOSTRIL TWICE DAILY *NEEDS APPOINTMENT* 16 g 5  . HYDROcodone-acetaminophen (NORCO) 10-325 MG tablet Take One tablet by mouth every 6 hours if needed for pain 120 tablet 0  . Ipratropium-Albuterol (COMBIVENT RESPIMAT) 20-100 MCG/ACT AERS respimat Inhale 2 puffs into the lungs 4 (four) times daily. 2 Inhaler 5  . loratadine-pseudoephedrine (CLARITIN-D 12-HOUR) 5-120 MG tablet One up to twice daily for allergy and congestion 60 tablet 5  . metFORMIN (GLUCOPHAGE) 500 MG tablet Take 1 tablet (500 mg total) by mouth daily as needed (high blood sugar- 160+). Take one tablet at night for blood sugar 60 tablet 5  . Multiple Vitamins-Minerals (CENTRUM SILVER PO) Take 1 tablet by mouth daily. Patient unsure of dose    . potassium chloride (K-DUR,KLOR-CON) 10 MEQ tablet TAKE 1 TABLET BY MOUTH EVERY DAY 30 tablet 1  . traMADol (ULTRAM) 50 MG tablet TAKE 1 TABLET BY MOUTH THREE TIMES A DAY AS NEEDED FOR PAIN 90 tablet 1   No current facility-administered medications for this visit.    Facility-Administered Medications Ordered in Other Visits    Medication Dose Route Frequency Provider Last Rate Last Dose  . hyaluronate sodium (RADIAPLEXRX) gel   Topical Once Thea Silversmith, MD      . topical emolient (BIAFINE) emulsion   Topical PRN Thea Silversmith, MD        SURGICAL HISTORY:  Past Surgical History:  Procedure Laterality Date  . BRONCHOSCOPY  02/2011  . CARDIAC CATHETERIZATION    . CATARACT EXTRACTION  03/12/2013   Left Eye   . DILATION AND CURETTAGE OF UTERUS  1980's  . EYE SURGERY Left 2014   Cataract  . FETAL BLOOD TRANSFUSION  09/16/11  . JOINT REPLACEMENT  04/13/10   Left Hip  . LUNG LOBECTOMY  1986   left  . SEPTOPLASTY    . TOTAL HIP ARTHROPLASTY  01/2010   left  . TUBAL LIGATION  1980's    REVIEW OF SYSTEMS:  Constitutional: positive for fatigue Eyes: negative Ears, nose, mouth, throat, and face: negative Respiratory: positive for cough, dyspnea on exertion and wheezing Cardiovascular: negative Gastrointestinal: negative Genitourinary:negative Integument/breast: negative Hematologic/lymphatic: negative Musculoskeletal:negative Neurological: negative Behavioral/Psych: negative Endocrine: negative Allergic/Immunologic: negative   PHYSICAL EXAMINATION: General appearance: alert, cooperative, fatigued and mild distress Head: Normocephalic, without obvious abnormality, atraumatic Neck: no adenopathy, no JVD, supple, symmetrical, trachea midline and thyroid not enlarged, symmetric, no tenderness/mass/nodules Lymph nodes: Cervical, supraclavicular, and axillary nodes normal. Resp: wheezes bilaterally Back: symmetric, no curvature. ROM normal. No CVA tenderness. Cardio: regular rate and rhythm, S1, S2 normal, no murmur, click, rub or gallop GI: soft, non-tender; bowel sounds normal; no masses,  no organomegaly Extremities: extremities normal, atraumatic, no cyanosis or edema Neurologic: Alert and oriented X 3, normal strength and tone. Normal symmetric reflexes. Normal coordination and gait  ECOG  PERFORMANCE STATUS: 1 - Symptomatic but completely ambulatory  Blood pressure (!) 158/66, pulse (!) 109, temperature 98.6 F (37 C), temperature source Oral, resp. rate 18, height 5' 6"  (1.676 m), weight 174 lb (78.9 kg), SpO2 92 %.  LABORATORY DATA: Lab Results  Component Value Date   WBC 8.6 08/31/2016   HGB 12.6 08/31/2016   HCT 37.5 08/31/2016   MCV 90.2 08/31/2016   PLT 206 08/31/2016      Chemistry      Component Value Date/Time   NA 140 08/31/2016 1148   K 4.2 08/31/2016  1148   CL 95 (L) 03/08/2016 1717   CL 99 07/21/2012 1125   CO2 26 08/31/2016 1148   BUN 13.6 08/31/2016 1148   CREATININE 1.0 08/31/2016 1148      Component Value Date/Time   CALCIUM 9.8 08/31/2016 1148   ALKPHOS 91 08/31/2016 1148   AST 18 08/31/2016 1148   ALT 13 08/31/2016 1148   BILITOT 0.51 08/31/2016 1148       RADIOGRAPHIC STUDIES: Ct Chest Wo Contrast  Result Date: 08/31/2016 CLINICAL DATA:  Left lung cancer diagnosed in 1986. Right lung cancer diagnosed in 2012. Radiation and chemotherapy completed. Immunotherapy last administered 3 months ago. EXAM: CT CHEST WITHOUT CONTRAST TECHNIQUE: Multidetector CT imaging of the chest was performed following the standard protocol without IV contrast. COMPARISON:  Chest CT 06/02/2016 and 03/02/2016. FINDINGS: Cardiovascular: There is atherosclerosis of the aorta, great vessels and coronary arteries. The heart size is normal. There is no pericardial effusion. Mediastinum/Nodes: There is stable soft tissue fullness in the right hilum. No enlarged mediastinal, hilar or axillary lymph nodes are seen. Hilar assessment is limited by the lack of intravenous contrast. Stable small hiatal hernia. Lungs/Pleura: Small right pleural effusion has mildly increased in volume, although remains dependent in location. There is no significant pleural fluid on the left. Diffuse emphysematous changes are present. There are stable postsurgical changes status post left upper lobe  resection. There are radiation changes posteriorly in the mid left lung and stable ground-glass densities (images 22 and 58). There is stable volume loss in the right upper lobe with abrupt cut off of the right upper lobe bronchus and right suprahilar soft tissue fullness. The ill-defined, predominantly ground-glass density in the right middle lobe is grossly stable, measuring approximately 7.9 x 5.3 cm (image 58). No progressive soft tissue components or new pulmonary nodules are seen. Upper abdomen: The visualized upper abdomen appears stable without suspicious findings. There are bilateral low-density adrenal adenomas. Calcified gallstones are present. Musculoskeletal/Chest wall: There is no chest wall mass or suspicious osseous finding. There are stable T4 and T5 compression deformities status post spinal augmentation at T4. Nonunion of right posterior fifth and sixth rib fractures and right humeral head AVN are noted. The left humeral head is not imaged. IMPRESSION: 1. Interval enlargement of dependent right pleural effusion. No definite malignant features or other significant changes. 2. No significant change in appearance of the lungs with postsurgical findings, volume loss and stable ground-glass nodularity bilaterally. Continued follow-up recommended. 3. No evidence of metastatic disease. 4. Bilateral adrenal adenomas and cholelithiasis. Electronically Signed   By: Richardean Sale M.D.   On: 08/31/2016 16:13   [place]  Result Date: 08/10/2016 EXAM: DUAL X-RAY ABSORPTIOMETRY (DXA) FOR BONE MINERAL DENSITY IMPRESSION: Referring Physician:  Rexene Edison REED PATIENT: Name: Allix, Blomquist Patient ID: [id number] Birth Date: [birth year] Height: 65.5 in. Sex: Female Measured: 08/10/2016 Weight: 171.4 lbs. Indications: Advanced Age, Caucasian, Estrogen Deficient, Left hip replaced, Postmenopausal, Tobacco User (Current Smoker) Fractures: Rib Treatments: Multivitamin ASSESSMENT: The BMD measured at Femur Neck is  0.567 g/cm2 with a T-score of -3.4. This patient is considered osteoporotic according to [facility]) criteria. L-2 and L-3 were excluded due to degenerative changes. Per the official positions of the ISCD, it is not possible to quantitatively compare BMD or calculate an [facility] between exams done at different facilities. Site Region Measured Date Measured Age YA BMD Significant CHANGE T-score Right Femur Neck 08/10/2016 72.4 -3.4 0.567 g/cm2 AP Spine L1-L4 (L2,L3) 08/10/2016 72.4 -1.7 0.965  g/cm2 World Health Organization [facility]) criteria for post-menopausal, Caucasian Women: Normal       T-score at or above -1 SD Osteopenia   T-score between -1 and -2.5 SD Osteoporosis T-score at or below -2.5 SD RECOMMENDATION: [place] recommends that FDA-approved medical therapies be considered in postmenopausal women and men age 75 or older with a: 1. Hip or vertebral (clinical or morphometric) fracture. 2. T-score of <-2.5 at the spine or hip. 3. Ten-year fracture probability by FRAX of 3% or greater for hip fracture or 20% or greater for major osteoporotic fracture. All treatment decisions require clinical judgment and consideration of individual patient factors, including patient preferences, co-morbidities, previous drug use, risk factors not captured in the FRAX model (e.g. falls, vitamin D deficiency, increased bone turnover, interval significant decline in bone density) and possible under - or over-estimation of fracture risk by FRAX. All patients should ensure an adequate intake of dietary calcium (1200 mg/d) and vitamin D (800 IU daily) unless contraindicated. FOLLOW-UP: People with diagnosed cases of osteoporosis or at high risk for fracture should have regular bone mineral density tests. For patients eligible for Medicare, routine testing is allowed once every 2 years. The testing frequency can be increased to one year for patients who have rapidly progressing disease, those who  are receiving or discontinuing medical therapy to restore bone mass, or have additional risk factors. I have reviewed this report, and agree with the above findings. [facility] Electronically Signed   By: Rolm Baptise M.D.   On: 08/10/2016 12:19   Mammogram Digital Screening  Result Date: 08/10/2016 CLINICAL DATA:  Screening. EXAM: DIGITAL SCREENING BILATERAL MAMMOGRAM WITH CAD COMPARISON:  Previous exam(s). ACR Breast Density Category b: There are scattered areas of fibroglandular density. FINDINGS: There are no findings suspicious for malignancy. Images were processed with CAD. IMPRESSION: No mammographic evidence of malignancy. A result letter of this screening mammogram will be mailed directly to the patient. RECOMMENDATION: Screening mammogram in one year. (Code:SM-B-01Y) BI-RADS CATEGORY  1: Negative. Electronically Signed   By: Lajean Manes M.D.   On: 08/10/2016 12:49    ASSESSMENT AND PLAN:  This is a very pleasant 71 years old white female with recurrent non-small cell lung cancer, adenocarcinoma that was initially diagnosed as unresectable a stage II a in December 2012. The patient underwent curative radiotherapy followed by 3 cycles of systemic chemotherapy was carboplatin and Alimta followed by observation for more than 3 years. She had disease recurrence and the patient was started on treatment again with 2 cycles of carboplatin and Alimta discontinued secondary to interval. She was started on treatment with immunotherapy with Nivolumab every 2 weeks status post 14 cycles last cycle was given in December 2017. Her treatment was on hold secondary to COPD exacerbation requiring prolonged course of treatment with steroids. She had repeat CT scan of the chest performed recently that showed stable disease in the right lung but there was an enlarging right pleural effusion. I personally and independently reviewed the scan images and discuss the results with the patient and her  daughter. I gave the patient the option of continuous observation and close monitoring versus resuming her treatment with immunotherapy with Nivolumab. She is interested in resuming her treatment with Nivolumab. I reminded her of the adverse effect of this treatment. She is expected to start cycle #15 of this treatment on 09/15/2016. I will see her back for follow-up visit in 3 weeks for evaluation before starting cycle #16 of her treatment. For  the enlarging right pleural effusion, I gave the patient the option of monitoring versus consideration of ultrasound guided thoracentesis. She would like to wait and see if the treatment would be effective in reducing the pleural fluid that she would consider the ultrasound guided thoracentesis if she becomes more symptomatic. The patient was advised to call immediately if she has any concerning symptoms in the interval. The patient voices understanding of current disease status and treatment options and is in agreement with the current care plan. All questions were answered. The patient knows to call the clinic with any problems, questions or concerns. We can certainly see the patient much sooner if necessary. I spent 15 minutes counseling the patient face to face. The total time spent in the appointment was 25 minutes.  Disclaimer: This note was dictated with voice recognition software. Similar sounding words can inadvertently be transcribed and may not be corrected upon review.

## 2016-09-09 ENCOUNTER — Ambulatory Visit (INDEPENDENT_AMBULATORY_CARE_PROVIDER_SITE_OTHER): Payer: Medicare Other | Admitting: Internal Medicine

## 2016-09-09 ENCOUNTER — Encounter: Payer: Self-pay | Admitting: Internal Medicine

## 2016-09-09 VITALS — BP 136/54 | HR 88 | Temp 98.9°F | Ht 66.0 in | Wt 174.0 lb

## 2016-09-09 DIAGNOSIS — M25552 Pain in left hip: Secondary | ICD-10-CM | POA: Diagnosis not present

## 2016-09-09 DIAGNOSIS — K2100 Gastro-esophageal reflux disease with esophagitis, without bleeding: Secondary | ICD-10-CM

## 2016-09-09 DIAGNOSIS — G44229 Chronic tension-type headache, not intractable: Secondary | ICD-10-CM

## 2016-09-09 DIAGNOSIS — C3411 Malignant neoplasm of upper lobe, right bronchus or lung: Secondary | ICD-10-CM

## 2016-09-09 DIAGNOSIS — M5136 Other intervertebral disc degeneration, lumbar region: Secondary | ICD-10-CM | POA: Diagnosis not present

## 2016-09-09 DIAGNOSIS — M542 Cervicalgia: Secondary | ICD-10-CM | POA: Diagnosis not present

## 2016-09-09 DIAGNOSIS — I739 Peripheral vascular disease, unspecified: Secondary | ICD-10-CM | POA: Diagnosis not present

## 2016-09-09 DIAGNOSIS — G8929 Other chronic pain: Secondary | ICD-10-CM | POA: Diagnosis not present

## 2016-09-09 DIAGNOSIS — J449 Chronic obstructive pulmonary disease, unspecified: Secondary | ICD-10-CM

## 2016-09-09 DIAGNOSIS — I10 Essential (primary) hypertension: Secondary | ICD-10-CM | POA: Diagnosis not present

## 2016-09-09 DIAGNOSIS — I779 Disorder of arteries and arterioles, unspecified: Secondary | ICD-10-CM

## 2016-09-09 DIAGNOSIS — K3184 Gastroparesis: Secondary | ICD-10-CM

## 2016-09-09 MED ORDER — METOCLOPRAMIDE HCL 5 MG PO TABS: 5.0000 mg | ORAL_TABLET | Freq: Four times a day (QID) | ORAL | 3 refills | Status: DC

## 2016-09-09 MED ORDER — TRAMADOL HCL 50 MG PO TABS: 100.0000 mg | ORAL_TABLET | Freq: Four times a day (QID) | ORAL | 1 refills | Status: DC | PRN

## 2016-09-09 MED ORDER — DILTIAZEM HCL ER COATED BEADS 240 MG PO TB24: 240.0000 mg | ORAL_TABLET | Freq: Every day | ORAL | 3 refills | Status: DC

## 2016-09-09 MED ORDER — RANITIDINE HCL 300 MG PO TABS: 300.0000 mg | ORAL_TABLET | Freq: Every day | ORAL | 3 refills | Status: DC

## 2016-09-09 MED ORDER — HYDROCODONE-ACETAMINOPHEN 10-325 MG PO TABS: ORAL_TABLET | ORAL | 0 refills | Status: DC

## 2016-09-09 NOTE — Progress Notes (Signed)
Location:  [facility] clinic Provider:  Legacie Dillingham L. Mariea Clonts, D.O., C.M.D.  Code Status: full code Goals of Care:  Advanced Directives 09/09/2016  Does Patient Have a Medical Advance Directive? No  Would patient like information on creating a medical advance directive? -  Pre-existing out of facility DNR order (yellow form or pink MOST form) -  needs advance directive discussion   Chief Complaint  Patient presents with  . Medical Management of Chronic Issues    talk about bone density  . Medication Management    Celexa was taken off medication lisit, patient needs her "mood pill"  . Blood Pressure Check    Dr. Nyoka Cowden stopped cardizen, patient thinks BP going up and needs to be on this  . stomach pain    was on Zantac and Reglan, needs something    HPI: Patient is a 71 y.o. female seen today for medical management of chronic diseases.  This is her first visit with me.  She has pressured speech and tells me all of the things she needs straightaway and keeps talking through the visit.    Was off her infusions for 6 mos for her lung cancer and she's going to go back on now after seeing [name].   30 years ago, had lung cancer in left lobe.  6 years ago, right started.  No new growth of right mass this time.    Has osteoporosis in her hip.  She is not on any vitamin D, calcium or bone health medications.  This will need to be addressed next time also.  Taking vicodin and tramadol for her neck and shoulder pains.  With vicodin, she feels hungover and blah all day but still takes it when the pain is at its worst.  She sleeps on her side.  Used ultram instead last night but thinks the dose needs to be higher and maybe can replace the vicodin  .  Gets pain her chest from the lung cancer.  Also her right boob hurts.  Has scoliosis and DDD.    Has gallbladder problems and feels nauseous and has indigestion all the time.  She wants to restart zantac and reglan.    Quit smoking, drinking, dating in  2000.  COPD:  On combivent qid, O2. She was wearing portable O2 at her appt today.  mild to moderate bilateral peripheral arterial disease. She has a known left common femoral stenosis and a right common iliac stenosis.  She follows with vascular surgery and was last seen 4/25.  She is to be doing a graduated exercise program and seated exercises for her legs.    There was a lot of confusion about her medications.  She requested refills of medications that are not on her med list.  She reported her bp running high at other appts and wanted her diltiazem added back.   Past Medical History:  Diagnosis Date  . Acute posthemorrhagic anemia   . Acute sinusitis, unspecified   . Acute upper respiratory infections of unspecified site   . Adenocarcinoma, lung ([place])    upper left lobe  . Anemia in neoplastic disease   . Anemia, unspecified   . Anginal pain ([place])   . Anxiety   . Anxiety state, unspecified   . Arthritis    "all over my body"  . Asthma   . Asymptomatic varicose veins   . Atherosclerosis of native arteries of the extremities, unspecified   . Atrial fibrillation ([place])   . Bronchial pneumonia  history  . 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 Chemotherapy adverse reaction 09/15/11   "makes me light headed and pass out; this is 3rd blood transfusion since going thru it"  . CHF (congestive heart failure) ([place])   . Chronic airway obstruction, not elsewhere classified   . Chronic fatigue 08/19/2015  . Chronic sinusitis   . Complication of anesthesia    slow to wake up  . COPD (chronic obstructive pulmonary disease) ([place])   . Coronary artery disease    Dr. Burt Knack had stress test done  . DDD (degenerative disc disease), lumbar   . Depressive disorder, not elsewhere classified   . Diseases of lips   . Disorder of bone and cartilage, unspecified   . Dizziness and giddiness   . DVT (deep venous thrombosis) ([place])   . Dysrhythmia    irregular  . Effects of radiation,  unspecified   . Emphysema   . Encounter for antineoplastic immunotherapy 08/19/2015  . Fibromyalgia   . Functional urinary incontinence   . GERD (gastroesophageal reflux disease)   . Hard of hearing, right   . Heart murmur   . Herpes zoster without mention of complication   . Hip pain, chronic 09/17/2014  . History of blood clots    "2 in my aorta"  . History of blood transfusion   . History of bronchitis   . Hx of radiation therapy 04/28/11 -06/08/11   right upper lobe-lung  . Hyperlipidemia   . Hyperpotassemia   . Hypertension   . Hypopotassemia   . Kidney infection    "related to chemo"  . Lumbago   . Malignant neoplasm of bronchus and lung, unspecified site   . Mental disorder   . Muscle weakness (generalized)   . Muscle weakness (generalized)   . Non-small cell lung cancer ([place]) 03/31/2011   Adenocarcinoma Rt upper lobe mass  . Osteoarthrosis, unspecified whether generalized or localized, unspecified site   . Osteoporosis   . Other abnormal blood chemistry   . Other and unspecified hyperlipidemia   . PAD (peripheral artery disease) ([place])   . Pain in thoracic spine   . Peripheral vascular disease ([place])   . 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 Primary cancer of right upper lobe of lung ([place]) 02/08/2011   Biopsy Rt upper lobe mass 03/31/11>>adenocarcinoma DIAGNOSIS: Stage IIB/IIIA non-small cell lung cancer consistent with adenocarcinoma diagnosed in December 2012.   PRIOR THERAPY:  1) Status post radiation therapy to the right upper lobe lung mass completed on 06/04/2011 under the care of Dr. Pablo Ledger.  2) Systemic chemotherapy with carboplatin for AUC of 5 and Alimta 500 mg/M2 giving every 3   . Reflux   . Reflux esophagitis   . Respiratory failure with hypoxia 01/02/2010  . Restless legs syndrome (RLS)   . Scoliosis   . Screening for thyroid disorder   . Shortness of breath    "anytime really"  . Sinus headache    "all the time"  . Spasm of muscle   . Syncope and  collapse   . Type II or unspecified type diabetes mellitus with neurological manifestations, uncontrolled(250.62)   . Type II or unspecified type diabetes mellitus without mention of complication, not stated as uncontrolled   . Type II or unspecified type diabetes mellitus without mention of complication, not stated as uncontrolled   . Unspecified constipation   . Unspecified essential hypertension   . Unspecified hearing loss   . Unspecified hereditary and idiopathic peripheral  neuropathy   . Unspecified vitamin D deficiency   . Urinary tract infection, site not specified     Past Surgical History:  Procedure Laterality Date  . BRONCHOSCOPY  02/2011  . CARDIAC CATHETERIZATION    . CATARACT EXTRACTION  03/12/2013   Left Eye   . DILATION AND CURETTAGE OF UTERUS  1980's  . EYE SURGERY Left 2014   Cataract  . FETAL BLOOD TRANSFUSION  09/16/11  . JOINT REPLACEMENT  04/13/10   Left Hip  . LUNG LOBECTOMY  1986   left  . SEPTOPLASTY    . TOTAL HIP ARTHROPLASTY  01/2010   left  . TUBAL LIGATION  1980's    Allergies  Allergen Reactions  . Iohexol Shortness Of Breath and Other (See Comments)    "burns me up inside"    Pt does fine with 13 hour prep  01/17/12  . Other     CAN'T STAND THE SMELL OF PURE BLEACH; "HAVE TO BACK UP AND POUR AND DON'T BREATH IT TIL i GET CAP BACK ON; DILUTE IT TO SPRAY"  . Anoro Ellipta [Umeclidinium-Vilanterol] Swelling    Swelling of lips and mouth  . Codeine Itching and Nausea And Vomiting    REACTION: GI upset  . Protonix [Pantoprazole Sodium] Diarrhea    Allergies as of 09/09/2016      Reactions   Iohexol Shortness Of Breath, Other (See Comments)   "burns me up inside"    Pt does fine with 13 hour prep  01/17/12   Other    CAN'T STAND THE SMELL OF PURE BLEACH; "HAVE TO BACK UP AND POUR AND DON'T BREATH IT TIL i GET CAP BACK ON; DILUTE IT TO SPRAY"   Anoro Ellipta [umeclidinium-vilanterol] Swelling   Swelling of lips and mouth   Codeine Itching, Nausea  And Vomiting   REACTION: GI upset   Protonix [pantoprazole Sodium] Diarrhea      Medication List       Accurate as of 09/09/16  3:51 PM. Always use your most recent med list.          ACCU-CHEK AVIVA PLUS w/Device Kit Use as directed to check blood sugar Dx: E11.9, E11.49   ACCU-CHEK SOFTCLIX LANCETS lancets by Other route. Check blood sugar twice daily. Dx: E11.9, E11.49   albuterol (2.5 MG/3ML) 0.083% nebulizer solution Commonly known as:  PROVENTIL Take 3 mLs (2.5 mg total) by nebulization every 6 (six) hours as needed for wheezing or shortness of breath.   ALPRAZolam 1 MG tablet Commonly known as:  XANAX TAKE 1 TABLET BY MOUTH THREE TIMES A DAY AS NEEDED FOR ANXIETY   aspirin 81 MG tablet Take 81 mg by mouth daily.   CENTRUM SILVER PO Take 1 tablet by mouth daily. Patient unsure of dose   Cetirizine HCl 10 MG Caps Take by mouth.   fluticasone 50 MCG/ACT nasal spray Commonly known as:  FLONASE INSTILL 2 SPRAYS IN EACH NOSTRIL TWICE DAILY *NEEDS APPOINTMENT*   HYDROcodone-acetaminophen 10-325 MG tablet Commonly known as:  NORCO Take One tablet by mouth every 6 hours if needed for pain   Ipratropium-Albuterol 20-100 MCG/ACT Aers respimat Commonly known as:  COMBIVENT RESPIMAT Inhale 2 puffs into the lungs 4 (four) times daily.   loratadine-pseudoephedrine 5-120 MG tablet Commonly known as:  CLARITIN-D 12-hour One up to twice daily for allergy and congestion   metFORMIN 500 MG tablet Commonly known as:  GLUCOPHAGE Take 1 tablet (500 mg total) by mouth daily as needed (high blood sugar-  160+). Take one tablet at night for blood sugar   potassium chloride 10 MEQ tablet Commonly known as:  K-DUR,KLOR-CON TAKE 1 TABLET BY MOUTH EVERY DAY   traMADol 50 MG tablet Commonly known as:  ULTRAM TAKE 1 TABLET BY MOUTH THREE TIMES A DAY AS NEEDED FOR PAIN   triamterene-hydrochlorothiazide 37.5-25 MG tablet Commonly known as:  MAXZIDE-25 Take 1 tablet by mouth  daily.       Review of Systems:  Review of Systems  Constitutional: Positive for malaise/fatigue. Negative for chills and fever.  HENT: Negative for congestion.   Eyes: Negative for blurred vision.  Respiratory: Positive for cough, sputum production, shortness of breath and wheezing. Negative for hemoptysis.   Cardiovascular: Negative for chest pain and palpitations.       Leg pain  Gastrointestinal: Positive for heartburn and nausea. Negative for abdominal pain, blood in stool, constipation, diarrhea, melena and vomiting.  Genitourinary: Negative for dysuria.  Musculoskeletal: Positive for joint pain, myalgias and neck pain. Negative for falls.  Skin: Negative for itching and rash.  Neurological: Positive for tingling, sensory change and weakness. Negative for dizziness and loss of consciousness.  Endo/Heme/Allergies:       Diabetes  Psychiatric/Behavioral: Positive for depression. Negative for suicidal ideas. The patient is nervous/anxious and has insomnia.     Health Maintenance  Topic Date Due  . Hepatitis C Screening  [birth year]  . FOOT EXAM  [birth year]06/1954  . COLONOSCOPY  03/06/1994  . PNA vac Low Risk Adult (2 of 2 - PCV13) 03/27/2014  . OPHTHALMOLOGY EXAM  10/31/2015  . HEMOGLOBIN A1C  02/05/2016  . URINE MICROALBUMIN  08/05/2016  . TETANUS/TDAP  06/10/2017 (Originally 03/07/1965)  . MAMMOGRAM  08/11/2018  . DEXA SCAN  Completed    Physical Exam: Vitals:   09/09/16 1536  BP: (!) 136/54  Pulse: 88  Temp: 98.9 F (37.2 C)  TempSrc: Oral  SpO2: 90%  Weight: 174 lb (78.9 kg)  Height: 5' 6"  (1.676 m)   Body mass index is 28.08 kg/m. Physical Exam  Constitutional: She is oriented to person, place, and time. No distress.  HENT:  Head: Normocephalic and atraumatic.  Pulmonary/Chest: She has wheezes.  Using portable O2  Abdominal: Soft. Bowel sounds are normal.  Musculoskeletal: Normal range of motion. She exhibits tenderness.  Neck and shoulders  Neurological:  She is alert and oriented to person, place, and time.  Skin: Skin is warm and dry.  Psychiatric:  Anxious, pressured speech    Labs reviewed: Basic Metabolic Panel:  Recent Labs  01/22/16 1304  02/18/16 1149  03/08/16 1717  06/02/16 1336 06/25/16 0923 06/25/16 0923 08/31/16 1148  NA  --   < > 143  < > 138  < > 140  --  140 140  K  --   < > 3.3*  < > 3.1*  < > 3.4*  --  4.1 4.2  CL  --   --   --   --  95*  --   --   --   --   --   CO2  --   < > 29  < >  --   < > 28  --  26 26  GLUCOSE  --   < > 119  < > 123*  < > 195*  --  88 97  BUN  --   < > 16.3  < > 13  < > 17.3  --  15.5 13.6  CREATININE  --   < >  1.2*  < > 1.00  < > 1.1  --  1.0 1.0  CALCIUM  --   < > 10.6*  < >  --   < > 10.0  --  9.9 9.8  TSH 1.696  --  2.208  --   --   --   --  2.384  --   --   < > = values in this interval not displayed. Liver Function Tests:  Recent Labs  06/02/16 1336 06/25/16 0923 08/31/16 1148  AST 15 16 18   ALT 8 10 13   ALKPHOS 108 92 91  BILITOT 0.35 0.35 0.51  PROT 6.3* 6.5 6.1*  ALBUMIN 3.4* 3.6 3.5   No results for input(s): LIPASE, AMYLASE in the last 8760 hours. No results for input(s): AMMONIA in the last 8760 hours. CBC:  Recent Labs  06/02/16 1336 06/25/16 0923 08/31/16 1148  WBC 7.8 8.7 8.6  NEUTROABS 5.4 4.8 5.4  HGB 11.0* 11.7 12.6  HCT 33.5* 36.6 37.5  MCV 92.0 93.6 90.2  PLT 298 277 206   Lipid Panel: No results for input(s): CHOL, HDL, LDLCALC, TRIG, CHOLHDL, LDLDIRECT in the last 8760 hours. Lab Results  Component Value Date   HGBA1C 5.5 08/06/2015    Procedures since last visit: Ct Chest Wo Contrast  Result Date: 08/31/2016 CLINICAL DATA:  Left lung cancer diagnosed in 1986. Right lung cancer diagnosed in 2012. Radiation and chemotherapy completed. Immunotherapy last administered 3 months ago. EXAM: CT CHEST WITHOUT CONTRAST TECHNIQUE: Multidetector CT imaging of the chest was performed following the standard protocol without IV contrast. COMPARISON:   Chest CT 06/02/2016 and 03/02/2016. FINDINGS: Cardiovascular: There is atherosclerosis of the aorta, great vessels and coronary arteries. The heart size is normal. There is no pericardial effusion. Mediastinum/Nodes: There is stable soft tissue fullness in the right hilum. No enlarged mediastinal, hilar or axillary lymph nodes are seen. Hilar assessment is limited by the lack of intravenous contrast. Stable small hiatal hernia. Lungs/Pleura: Small right pleural effusion has mildly increased in volume, although remains dependent in location. There is no significant pleural fluid on the left. Diffuse emphysematous changes are present. There are stable postsurgical changes status post left upper lobe resection. There are radiation changes posteriorly in the mid left lung and stable ground-glass densities (images 22 and 58). There is stable volume loss in the right upper lobe with abrupt cut off of the right upper lobe bronchus and right suprahilar soft tissue fullness. The ill-defined, predominantly ground-glass density in the right middle lobe is grossly stable, measuring approximately 7.9 x 5.3 cm (image 58). No progressive soft tissue components or new pulmonary nodules are seen. Upper abdomen: The visualized upper abdomen appears stable without suspicious findings. There are bilateral low-density adrenal adenomas. Calcified gallstones are present. Musculoskeletal/Chest wall: There is no chest wall mass or suspicious osseous finding. There are stable T4 and T5 compression deformities status post spinal augmentation at T4. Nonunion of right posterior fifth and sixth rib fractures and right humeral head AVN are noted. The left humeral head is not imaged. IMPRESSION: 1. Interval enlargement of dependent right pleural effusion. No definite malignant features or other significant changes. 2. No significant change in appearance of the lungs with postsurgical findings, volume loss and stable ground-glass nodularity  bilaterally. Continued follow-up recommended. 3. No evidence of metastatic disease. 4. Bilateral adrenal adenomas and cholelithiasis. Electronically Signed   By: Richardean Sale M.D.   On: 08/31/2016 16:13    Assessment/Plan 1. Gastroparesis - not sure she  has this, but has been on reglan for bloating and abdominal pain--requested refill when was not on list  - metoCLOPramide (REGLAN) 5 MG tablet; Take 1 tablet (5 mg total) by mouth 4 (four) times daily.  Dispense: 120 tablet; Refill: 3  2. Neck pain - biggest complaint today - traMADol (ULTRAM) 50 MG tablet; Take 2 tablets (100 mg total) by mouth every 6 (six) hours as needed for severe pain.  Dispense: 90 tablet; Refill: 1  3. Essential hypertension -cont triamtene/hctz, but will add back cardizem for bp--will need to see how this goes--may not need this dose - diltiazem (CARDIZEM LA) 240 MG 24 hr tablet; Take 1 tablet (240 mg total) by mouth daily.  Dispense: 90 tablet; Refill: 3  4. Gastroesophageal reflux disease with esophagitis - resume zantac - ranitidine (ZANTAC) 300 MG tablet; Take 1 tablet (300 mg total) by mouth at bedtime.  Dispense: 90 tablet; Refill: 3  DDD (degenerative disc disease), lumbar, left hip pain, chronic neck pain from tension - ongoing, continues on norco for severe pain, tramadol for moderate pain - HYDROcodone-acetaminophen (NORCO) 10-325 MG tablet; Take One tablet by mouth every 6 hours if needed for pain  Dispense: 120 tablet; Refill: 0  Primary cancer of right upper lobe of lung ([place]) -she is going to go back on immunotherapy after "feeling better on it"  PVD -cont f/u with vascular, exercises, graduated walking program as able  Chronic obstructive pulmonary disease, unspecified COPD type ([place]) -cont combivent and oxygen therapy   Labs/tests ordered:  No orders of the defined types were placed in this encounter.  Next appt:  11/15/2016 for one hour appt to more thoroughly review history, address  advance directives, and medications   Melia Hopes L. Donneisha Beane, D.O. [facility] [address], [place] [address] Cell Phone (Mon-Fri 8am-5pm):  [phone] On Call:  [phone] & follow prompts after 5pm & weekends Office Phone:  [phone] Office Fax:  [phone]

## 2016-09-10 ENCOUNTER — Telehealth: Payer: Self-pay | Admitting: Medical Oncology

## 2016-09-10 ENCOUNTER — Telehealth: Payer: Self-pay | Admitting: Internal Medicine

## 2016-09-10 NOTE — Telephone Encounter (Signed)
Scheduled appt per 5/30 - patient aware of appts. Will pick up new schedule next visit.

## 2016-09-10 NOTE — Telephone Encounter (Signed)
appts confirmed with pt.

## 2016-09-13 ENCOUNTER — Telehealth: Payer: Self-pay | Admitting: Internal Medicine

## 2016-09-13 NOTE — Telephone Encounter (Signed)
Called to confirm appt with patient per MM moved appts to Thursdays - patient aware and will pick up new schedule 6/8.

## 2016-09-14 ENCOUNTER — Ambulatory Visit: Payer: Medicare Other | Admitting: Emergency Medicine

## 2016-09-17 ENCOUNTER — Ambulatory Visit (HOSPITAL_BASED_OUTPATIENT_CLINIC_OR_DEPARTMENT_OTHER): Payer: Medicare Other

## 2016-09-17 ENCOUNTER — Other Ambulatory Visit (HOSPITAL_BASED_OUTPATIENT_CLINIC_OR_DEPARTMENT_OTHER): Payer: Medicare Other

## 2016-09-17 VITALS — BP 143/45 | HR 72 | Temp 98.2°F | Resp 20

## 2016-09-17 DIAGNOSIS — C3411 Malignant neoplasm of upper lobe, right bronchus or lung: Secondary | ICD-10-CM | POA: Diagnosis not present

## 2016-09-17 DIAGNOSIS — Z5112 Encounter for antineoplastic immunotherapy: Secondary | ICD-10-CM | POA: Diagnosis not present

## 2016-09-17 DIAGNOSIS — R5382 Chronic fatigue, unspecified: Secondary | ICD-10-CM | POA: Diagnosis not present

## 2016-09-17 LAB — COMPREHENSIVE METABOLIC PANEL
ALT: 9 U/L (ref 0–55)
ANION GAP: 11 meq/L (ref 3–11)
AST: 18 U/L (ref 5–34)
Albumin: 3.7 g/dL (ref 3.5–5.0)
Alkaline Phosphatase: 86 U/L (ref 40–150)
BUN: 12.5 mg/dL (ref 7.0–26.0)
CO2: 26 meq/L (ref 22–29)
CREATININE: 1.1 mg/dL (ref 0.6–1.1)
Calcium: 9.8 mg/dL (ref 8.4–10.4)
Chloride: 105 mEq/L (ref 98–109)
EGFR: 50 mL/min/{1.73_m2} — ABNORMAL LOW (ref >90)
Glucose: 95 mg/dl (ref 70–140)
Potassium: 4.1 mEq/L (ref 3.5–5.1)
Sodium: 143 mEq/L (ref 136–145)
Total Bilirubin: 0.54 mg/dL (ref 0.20–1.20)
Total Protein: 6.5 g/dL (ref 6.4–8.3)

## 2016-09-17 LAB — CBC WITH DIFFERENTIAL/PLATELET
BASO%: 1 % (ref 0.0–2.0)
Basophils Absolute: 0.1 10*3/uL (ref 0.0–0.1)
EOS%: 9.2 % — ABNORMAL HIGH (ref 0.0–7.0)
Eosinophils Absolute: 0.9 10*3/uL — ABNORMAL HIGH (ref 0.0–0.5)
HEMATOCRIT: 37.8 % (ref 34.8–46.6)
HGB: 12.6 g/dL (ref 11.6–15.9)
LYMPH#: 2 10*3/uL (ref 0.9–3.3)
LYMPH%: 21.1 % (ref 14.0–49.7)
MCH: 30 pg (ref 25.1–34.0)
MCHC: 33.2 g/dL (ref 31.5–36.0)
MCV: 90.3 fL (ref 79.5–101.0)
MONO#: 0.7 10*3/uL (ref 0.1–0.9)
MONO%: 7.4 % (ref 0.0–14.0)
NEUT#: 5.9 10*3/uL (ref 1.5–6.5)
NEUT%: 61.3 % (ref 38.4–76.8)
PLATELETS: 274 10*3/uL (ref 145–400)
RBC: 4.19 10*6/uL (ref 3.70–5.45)
RDW: 13.6 % (ref 11.2–14.5)
WBC: 9.6 10*3/uL (ref 3.9–10.3)

## 2016-09-17 LAB — TSH: TSH: 1.967 m(IU)/L (ref 0.308–3.960)

## 2016-09-17 MED ORDER — NIVOLUMAB CHEMO INJECTION 100 MG/10ML
240.0000 mg | Freq: Once | INTRAVENOUS | Status: AC
  Administered 2016-09-17: 240 mg via INTRAVENOUS
  Filled 2016-09-17: qty 24

## 2016-09-17 MED ORDER — SODIUM CHLORIDE 0.9 % IV SOLN
Freq: Once | INTRAVENOUS | Status: AC
  Administered 2016-09-17: 15:00:00 via INTRAVENOUS

## 2016-09-17 NOTE — Patient Instructions (Signed)
[facility] Discharge Instructions for Patients Receiving Immunotherapy  Today you received the following agents: Nivolumab. If you develop diarrhea, take Imodium. Call the office for persistent diarrhea.   BELOW ARE SYMPTOMS THAT SHOULD BE REPORTED IMMEDIATELY:  *FEVER GREATER THAN 100.5 F  *CHILLS WITH OR WITHOUT FEVER  NAUSEA AND VOMITING THAT IS NOT CONTROLLED WITH YOUR NAUSEA MEDICATION  *UNUSUAL SHORTNESS OF BREATH  *UNUSUAL BRUISING OR BLEEDING  TENDERNESS IN MOUTH AND THROAT WITH OR WITHOUT PRESENCE OF ULCERS  *URINARY PROBLEMS  *BOWEL PROBLEMS  UNUSUAL RASH Items with * indicate a potential emergency and should be followed up as soon as possible.  Feel free to call the clinic should you have any questions or concerns. The clinic phone number is [phone].  Please show the [facility] at check-in to the Emergency Department and triage nurse.

## 2016-09-22 ENCOUNTER — Telehealth: Payer: Self-pay | Admitting: Emergency Medicine

## 2016-09-22 NOTE — Telephone Encounter (Signed)
Pt c/o increased sob with any exertion- worst when she takes her dog outside.  States this has worsened since starting "lung cancer infusions"- per chart had this last on 09/17/16.  Also notes she "feels stopped up in her chest". Denies chest pain, cough, mucus production, fever, wheezing.     Pt is using 02 and combivent to help with symptoms.  Pt requesting further recs. States "I think I need an asthma inhaler also".    Uses [facility] on [address].    RB please advise on recs.  Thanks.

## 2016-09-22 NOTE — Telephone Encounter (Signed)
Tell her that I insist that she be seen by somebody else before August 30. I want this to happen because she needs to be evaluated to ensure that she can stay on the immunotherapy. It is not clear to me whether her SOB is her obstructive lung disease versus her cancer therapy.

## 2016-09-22 NOTE — Telephone Encounter (Signed)
She needs to be seen. She is correct that there could be several things contributing to her dyspnea including her immunotherapy through oncology.   If she is using her combivent 4x a day on a schedule this should be good regimen. Alternatives could include Stiolto, Anoro or bevespi. If she would like to change, might be best for her to tell us her insurance formulary.   Also please make sure she has albuterol HFA available to use prn.

## 2016-09-22 NOTE — Telephone Encounter (Signed)
Pt aware of rec's per Dr Lamonte Sakai. Pt states that she does not have an Albuterol HFA, she does however have have a Albuterol nebulizer meds but she has lost her nebulizer machine. Pt states that she has her Combivent and she feels that it does help some but she feels that her breathing could be better -- pt has agreed to try Albuterol HFA to see how this works first THEN try alternatives.   Pt refused to contact her insurance for her drug formulary, explained the importance of [place] having this to prescribe medications adequately.   Pt also stated that her shortness of breath started a week ago and she did not say anything because she wanted to get her infusion at On[facility] and was scared that if she mentioned not feeling well that they would not give it to her. Pt advised that in the future she needs to make sure to let someone know if she is not feeling well before getting an infusion just so they can have that documented.   Pt refused to see NP, only wants to schedule with Dr Lamonte Sakai.  Please advise RB if able to work patient in as your first available is 12/09/16

## 2016-09-22 NOTE — Telephone Encounter (Signed)
Pt scheduled to see BQ on Friday at 4:00.  Nothing further needed.

## 2016-09-24 ENCOUNTER — Ambulatory Visit: Payer: Medicare Other | Admitting: Pulmonary Disease

## 2016-09-28 ENCOUNTER — Telehealth: Payer: Self-pay | Admitting: *Deleted

## 2016-09-28 NOTE — Telephone Encounter (Signed)
Patient called and stated that she thinks the Ranitidine interferes with her breathing. Patient is going to discontinue the Ranitidine and just take the Reglan and see if her breathing gets better. Stated that ever since starting she feels short of Breath and feels it is coming from this medication. Will stop and let us know if it gets better. No other symptom except SOB.

## 2016-09-29 ENCOUNTER — Ambulatory Visit: Payer: Medicare Other

## 2016-09-29 ENCOUNTER — Ambulatory Visit: Payer: Medicare Other | Admitting: Nurse Practitioner

## 2016-09-29 ENCOUNTER — Other Ambulatory Visit: Payer: Medicare Other

## 2016-09-29 NOTE — Telephone Encounter (Signed)
Just noting, all of the meds we restarted last time were old meds she wanted back.  That's ok with me though I have not heard of an acid blocker interfering with breathing.

## 2016-09-30 ENCOUNTER — Ambulatory Visit (HOSPITAL_BASED_OUTPATIENT_CLINIC_OR_DEPARTMENT_OTHER): Payer: Medicare Other | Admitting: Internal Medicine

## 2016-09-30 ENCOUNTER — Encounter: Payer: Self-pay | Admitting: Internal Medicine

## 2016-09-30 ENCOUNTER — Ambulatory Visit (HOSPITAL_BASED_OUTPATIENT_CLINIC_OR_DEPARTMENT_OTHER): Payer: Medicare Other

## 2016-09-30 ENCOUNTER — Other Ambulatory Visit (HOSPITAL_BASED_OUTPATIENT_CLINIC_OR_DEPARTMENT_OTHER): Payer: Medicare Other

## 2016-09-30 ENCOUNTER — Telehealth: Payer: Self-pay | Admitting: Internal Medicine

## 2016-09-30 VITALS — BP 146/62 | HR 95 | Temp 98.5°F | Resp 20 | Ht 66.0 in | Wt 169.2 lb

## 2016-09-30 DIAGNOSIS — Z5112 Encounter for antineoplastic immunotherapy: Secondary | ICD-10-CM

## 2016-09-30 DIAGNOSIS — C3411 Malignant neoplasm of upper lobe, right bronchus or lung: Secondary | ICD-10-CM

## 2016-09-30 DIAGNOSIS — J449 Chronic obstructive pulmonary disease, unspecified: Secondary | ICD-10-CM

## 2016-09-30 DIAGNOSIS — Z9981 Dependence on supplemental oxygen: Secondary | ICD-10-CM

## 2016-09-30 LAB — CBC WITH DIFFERENTIAL/PLATELET
BASO%: 0.9 % (ref 0.0–2.0)
BASOS ABS: 0.1 10*3/uL (ref 0.0–0.1)
EOS ABS: 0.7 10*3/uL — AB (ref 0.0–0.5)
EOS%: 7.8 % — ABNORMAL HIGH (ref 0.0–7.0)
HEMATOCRIT: 37.6 % (ref 34.8–46.6)
HGB: 12.6 g/dL (ref 11.6–15.9)
LYMPH%: 23.7 % (ref 14.0–49.7)
MCH: 30.3 pg (ref 25.1–34.0)
MCHC: 33.5 g/dL (ref 31.5–36.0)
MCV: 90.4 fL (ref 79.5–101.0)
MONO#: 0.8 10*3/uL (ref 0.1–0.9)
MONO%: 8.2 % (ref 0.0–14.0)
NEUT#: 5.6 10*3/uL (ref 1.5–6.5)
NEUT%: 59.4 % (ref 38.4–76.8)
PLATELETS: 226 10*3/uL (ref 145–400)
RBC: 4.16 10*6/uL (ref 3.70–5.45)
RDW: 13.5 % (ref 11.2–14.5)
WBC: 9.5 10*3/uL (ref 3.9–10.3)
lymph#: 2.3 10*3/uL (ref 0.9–3.3)

## 2016-09-30 LAB — COMPREHENSIVE METABOLIC PANEL WITH GFR
ALT: 11 U/L (ref 0–55)
AST: 16 U/L (ref 5–34)
Albumin: 3.8 g/dL (ref 3.5–5.0)
Alkaline Phosphatase: 79 U/L (ref 40–150)
Anion Gap: 8 meq/L (ref 3–11)
BUN: 14 mg/dL (ref 7.0–26.0)
CO2: 27 meq/L (ref 22–29)
Calcium: 9.9 mg/dL (ref 8.4–10.4)
Chloride: 103 meq/L (ref 98–109)
Creatinine: 1 mg/dL (ref 0.6–1.1)
EGFR: 60 ml/min/1.73 m2 — ABNORMAL LOW (ref >90)
Glucose: 96 mg/dL (ref 70–140)
Potassium: 3.8 meq/L (ref 3.5–5.1)
Sodium: 138 meq/L (ref 136–145)
Total Bilirubin: 0.57 mg/dL (ref 0.20–1.20)
Total Protein: 6.6 g/dL (ref 6.4–8.3)

## 2016-09-30 MED ORDER — SODIUM CHLORIDE 0.9 % IV SOLN
Freq: Once | INTRAVENOUS | Status: AC
  Administered 2016-09-30: 10:00:00 via INTRAVENOUS

## 2016-09-30 MED ORDER — SODIUM CHLORIDE 0.9 % IV SOLN
240.0000 mg | Freq: Once | INTRAVENOUS | Status: AC
  Administered 2016-09-30: 240 mg via INTRAVENOUS
  Filled 2016-09-30: qty 24

## 2016-09-30 NOTE — Progress Notes (Signed)
[facility] [phone]   [phone]  OFFICE PROGRESS NOTE  System, Pcp Not In No address on file  DIAGNOSIS: Recurrent non-small cell lung cancer initially diagnosed as a stage IIA adenocarcinoma in December 2012 with no actionable mutations.   PRIOR THERAPY: 1) Status post radiation therapy to the right upper lobe lung mass completed on 06/04/2011 under the care of Dr. Pablo Ledger.  2) Systemic chemotherapy with carboplatin for AUC of 5 and Alimta 500 mg/M2 giving every 3 weeks, status post 3 cycles, last dose was given on 07/21/2011.  3) Systemic chemotherapy with carboplatin for AUC of 5 and Alimta 500 MG/M2 every 3 weeks. First dose on 01/02/2015. Status post 2 cycles, last dose was given 01/30/2015. Her treatment is currently on hold secondary to intolerance. 4) Immunotherapy with Nivolumab 240 M IV every 2 weeks status post 14 cycles.   CURRENT THERAPY: The patient will resume her treatment again with immunotherapy with Nivolumab 240 mg IV every 2 weeks. First dose 09/15/2016. Status post one cycle  INTERVAL HISTORY: [name] 71 y.o. female returns to the clinic today for follow-up visit accompanied by her son. The patient is feeling fine today was no specific complaints except for fatigue. She continues to have shortness breath at baseline and she is currently on home oxygen. She denied having any chest pain, cough or hemoptysis. She has no nausea, vomiting or constipation but has occasional diarrhea. She denied having any weight loss or night sweats. She has no fever or chills. She is here today for evaluation before starting the second cycle of her treatment.   MEDICAL HISTORY: Past Medical History:  Diagnosis Date  . Acute posthemorrhagic anemia   . Acute sinusitis, unspecified   . Acute upper respiratory infections of unspecified site   . Adenocarcinoma, lung ([place])    upper left lobe  . Anemia in neoplastic disease   . Anemia,  unspecified   . Anginal pain ([place])   . Anxiety   . Anxiety state, unspecified   . Arthritis    "all over my body"  . Asthma   . Asymptomatic varicose veins   . Atherosclerosis of native arteries of the extremities, unspecified   . Atrial fibrillation ([place])   . Bronchial pneumonia    history  . 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 Chemotherapy adverse reaction 09/15/11   "makes me light headed and pass out; this is 3rd blood transfusion since going thru it"  . CHF (congestive heart failure) ([place])   . Chronic airway obstruction, not elsewhere classified   . Chronic fatigue 08/19/2015  . Chronic sinusitis   . Complication of anesthesia    slow to wake up  . COPD (chronic obstructive pulmonary disease) ([place])   . Coronary artery disease    Dr. Burt Knack had stress test done  . DDD (degenerative disc disease), lumbar   . Depressive disorder, not elsewhere classified   . Diseases of lips   . Disorder of bone and cartilage, unspecified   . Dizziness and giddiness   . DVT (deep venous thrombosis) ([place])   . Dysrhythmia    irregular  . Effects of radiation, unspecified   . Emphysema   . Encounter for antineoplastic immunotherapy 08/19/2015  . Fibromyalgia   . Functional urinary incontinence   . GERD (gastroesophageal reflux disease)   . Hard of hearing, right   . Heart murmur   . Herpes zoster without mention of complication   .  Hip pain, chronic 09/17/2014  . History of blood clots    "2 in my aorta"  . History of blood transfusion   . History of bronchitis   . Hx of radiation therapy 04/28/11 -06/08/11   right upper lobe-lung  . Hyperlipidemia   . Hyperpotassemia   . Hypertension   . Hypopotassemia   . Kidney infection    "related to chemo"  . Lumbago   . Malignant neoplasm of bronchus and lung, unspecified site   . Mental disorder   . Muscle weakness (generalized)   . Muscle weakness (generalized)   . Non-small cell lung cancer ([place]) 03/31/2011   Adenocarcinoma Rt upper  lobe mass  . Osteoarthrosis, unspecified whether generalized or localized, unspecified site   . Osteoporosis   . Other abnormal blood chemistry   . Other and unspecified hyperlipidemia   . PAD (peripheral artery disease) ([place])   . Pain in thoracic spine   . Peripheral vascular disease ([place])   . 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 Primary cancer of right upper lobe of lung ([place]) 02/08/2011   Biopsy Rt upper lobe mass 03/31/11>>adenocarcinoma DIAGNOSIS: Stage IIB/IIIA non-small cell lung cancer consistent with adenocarcinoma diagnosed in December 2012.   PRIOR THERAPY:  1) Status post radiation therapy to the right upper lobe lung mass completed on 06/04/2011 under the care of Dr. Pablo Ledger.  2) Systemic chemotherapy with carboplatin for AUC of 5 and Alimta 500 mg/M2 giving every 3   . Reflux   . Reflux esophagitis   . Respiratory failure with hypoxia 01/02/2010  . Restless legs syndrome (RLS)   . Scoliosis   . Screening for thyroid disorder   . Shortness of breath    "anytime really"  . Sinus headache    "all the time"  . Spasm of muscle   . Syncope and collapse   . Type II or unspecified type diabetes mellitus with neurological manifestations, uncontrolled(250.62)   . Type II or unspecified type diabetes mellitus without mention of complication, not stated as uncontrolled   . Type II or unspecified type diabetes mellitus without mention of complication, not stated as uncontrolled   . Unspecified constipation   . Unspecified essential hypertension   . Unspecified hearing loss   . Unspecified hereditary and idiopathic peripheral neuropathy   . Unspecified vitamin D deficiency   . Urinary tract infection, site not specified     ALLERGIES:  is allergic to iohexol; other; anoro ellipta [umeclidinium-vilanterol]; codeine; and protonix [pantoprazole sodium].  MEDICATIONS:  Current Outpatient Prescriptions  Medication Sig Dispense Refill  . ACCU-CHEK SOFTCLIX LANCETS lancets by  Other route. Check blood sugar twice daily. Dx: E11.9, E11.49    . albuterol (PROVENTIL) (2.5 MG/3ML) 0.083% nebulizer solution Take 3 mLs (2.5 mg total) by nebulization every 6 (six) hours as needed for wheezing or shortness of breath. 75 mL 5  . ALPRAZolam (XANAX) 1 MG tablet TAKE 1 TABLET BY MOUTH THREE TIMES A DAY AS NEEDED FOR ANXIETY 90 tablet 0  . aspirin 81 MG tablet Take 81 mg by mouth daily.    . Blood Glucose Monitoring Suppl (ACCU-CHEK AVIVA PLUS) w/Device KIT Use as directed to check blood sugar Dx: E11.9, E11.49 1 kit 0  . Cetirizine HCl 10 MG CAPS Take by mouth.    . diltiazem (CARDIZEM LA) 240 MG 24 hr tablet Take 1 tablet (240 mg total) by mouth daily. 90 tablet 3  . fluticasone (FLONASE) 50 MCG/ACT nasal spray INSTILL 2 SPRAYS IN EACH NOSTRIL  TWICE DAILY *NEEDS APPOINTMENT* 16 g 5  . HYDROcodone-acetaminophen (NORCO) 10-325 MG tablet Take One tablet by mouth every 6 hours if needed for pain 120 tablet 0  . Ipratropium-Albuterol (COMBIVENT RESPIMAT) 20-100 MCG/ACT AERS respimat Inhale 2 puffs into the lungs 4 (four) times daily. 2 Inhaler 5  . loratadine-pseudoephedrine (CLARITIN-D 12-HOUR) 5-120 MG tablet One up to twice daily for allergy and congestion 60 tablet 5  . metFORMIN (GLUCOPHAGE) 500 MG tablet Take 1 tablet (500 mg total) by mouth daily as needed (high blood sugar- 160+). Take one tablet at night for blood sugar 60 tablet 5  . metoCLOPramide (REGLAN) 5 MG tablet Take 1 tablet (5 mg total) by mouth 4 (four) times daily. 120 tablet 3  . Multiple Vitamins-Minerals (CENTRUM SILVER PO) Take 1 tablet by mouth daily. Patient unsure of dose    . potassium chloride (K-DUR,KLOR-CON) 10 MEQ tablet TAKE 1 TABLET BY MOUTH EVERY DAY 30 tablet 1  . traMADol (ULTRAM) 50 MG tablet Take 2 tablets (100 mg total) by mouth every 6 (six) hours as needed for severe pain. 90 tablet 1  . triamterene-hydrochlorothiazide (MAXZIDE-25) 37.5-25 MG tablet Take 1 tablet by mouth daily.    . ranitidine  (ZANTAC) 300 MG tablet      No current facility-administered medications for this visit.    Facility-Administered Medications Ordered in Other Visits  Medication Dose Route Frequency Provider Last Rate Last Dose  . hyaluronate sodium (RADIAPLEXRX) gel   Topical Once Thea Silversmith, MD      . topical emolient (BIAFINE) emulsion   Topical PRN Thea Silversmith, MD        SURGICAL HISTORY:  Past Surgical History:  Procedure Laterality Date  . BRONCHOSCOPY  02/2011  . CARDIAC CATHETERIZATION    . CATARACT EXTRACTION  03/12/2013   Left Eye   . DILATION AND CURETTAGE OF UTERUS  1980's  . EYE SURGERY Left 2014   Cataract  . FETAL BLOOD TRANSFUSION  09/16/11  . JOINT REPLACEMENT  04/13/10   Left Hip  . LUNG LOBECTOMY  1986   left  . SEPTOPLASTY    . TOTAL HIP ARTHROPLASTY  01/2010   left  . TUBAL LIGATION  1980's    REVIEW OF SYSTEMS:  A comprehensive review of systems was negative except for: Constitutional: positive for fatigue Respiratory: positive for dyspnea on exertion   PHYSICAL EXAMINATION: General appearance: alert, cooperative, fatigued and mild distress Head: Normocephalic, without obvious abnormality, atraumatic Neck: no adenopathy, no JVD, supple, symmetrical, trachea midline and thyroid not enlarged, symmetric, no tenderness/mass/nodules Lymph nodes: Cervical, supraclavicular, and axillary nodes normal. Resp: wheezes bilaterally Back: symmetric, no curvature. ROM normal. No CVA tenderness. Cardio: regular rate and rhythm, S1, S2 normal, no murmur, click, rub or gallop GI: soft, non-tender; bowel sounds normal; no masses,  no organomegaly Extremities: extremities normal, atraumatic, no cyanosis or edema  ECOG PERFORMANCE STATUS: 1 - Symptomatic but completely ambulatory  Blood pressure (!) 146/62, pulse 95, temperature 98.5 F (36.9 C), temperature source Oral, resp. rate 20, height _0  (1.676 m), weight 169 lb 3.2 oz (76.7 kg), SpO2 100 %.  LABORATORY DATA: Lab  Results  Component Value Date   WBC 9.5 09/30/2016   HGB 12.6 09/30/2016   HCT 37.6 09/30/2016   MCV 90.4 09/30/2016   PLT 226 09/30/2016      Chemistry      Component Value Date/Time   NA 138 09/30/2016 0831   K 3.8 09/30/2016 0831   CL 95 (L)  03/08/2016 1717   CL 99 07/21/2012 1125   CO2 27 09/30/2016 0831   BUN 14.0 09/30/2016 0831   CREATININE 1.0 09/30/2016 0831      Component Value Date/Time   CALCIUM 9.9 09/30/2016 0831   ALKPHOS 79 09/30/2016 0831   AST 16 09/30/2016 0831   ALT 11 09/30/2016 0831   BILITOT 0.57 09/30/2016 0831       RADIOGRAPHIC STUDIES: Ct Chest Wo Contrast  Result Date: 08/31/2016 CLINICAL DATA:  Left lung cancer diagnosed in 1986. Right lung cancer diagnosed in 2012. Radiation and chemotherapy completed. Immunotherapy last administered 3 months ago. EXAM: CT CHEST WITHOUT CONTRAST TECHNIQUE: Multidetector CT imaging of the chest was performed following the standard protocol without IV contrast. COMPARISON:  Chest CT 06/02/2016 and 03/02/2016. FINDINGS: Cardiovascular: There is atherosclerosis of the aorta, great vessels and coronary arteries. The heart size is normal. There is no pericardial effusion. Mediastinum/Nodes: There is stable soft tissue fullness in the right hilum. No enlarged mediastinal, hilar or axillary lymph nodes are seen. Hilar assessment is limited by the lack of intravenous contrast. Stable small hiatal hernia. Lungs/Pleura: Small right pleural effusion has mildly increased in volume, although remains dependent in location. There is no significant pleural fluid on the left. Diffuse emphysematous changes are present. There are stable postsurgical changes status post left upper lobe resection. There are radiation changes posteriorly in the mid left lung and stable ground-glass densities (images 22 and 58). There is stable volume loss in the right upper lobe with abrupt cut off of the right upper lobe bronchus and right suprahilar soft  tissue fullness. The ill-defined, predominantly ground-glass density in the right middle lobe is grossly stable, measuring approximately 7.9 x 5.3 cm (image 58). No progressive soft tissue components or new pulmonary nodules are seen. Upper abdomen: The visualized upper abdomen appears stable without suspicious findings. There are bilateral low-density adrenal adenomas. Calcified gallstones are present. Musculoskeletal/Chest wall: There is no chest wall mass or suspicious osseous finding. There are stable T4 and T5 compression deformities status post spinal augmentation at T4. Nonunion of right posterior fifth and sixth rib fractures and right humeral head AVN are noted. The left humeral head is not imaged. IMPRESSION: 1. Interval enlargement of dependent right pleural effusion. No definite malignant features or other significant changes. 2. No significant change in appearance of the lungs with postsurgical findings, volume loss and stable ground-glass nodularity bilaterally. Continued follow-up recommended. 3. No evidence of metastatic disease. 4. Bilateral adrenal adenomas and cholelithiasis. Electronically Signed   By: Richardean Sale M.D.   On: 08/31/2016 16:13    ASSESSMENT AND PLAN:  This is a very pleasant 71 years old white female with recurrent non-small cell lung cancer, adenocarcinoma that was initially diagnosed as unresectable a stage IIa in December 2012 status post systemic chemotherapy as well as immunotherapy and she currently resumed her treatment with Nivolumab status post one cycle. She tolerated the first cycle well except for mild fatigue. I recommended for the patient to continue her treatment and she will proceed with cycle #2 today. For COPD, she will continue her current inhalers as well as home oxygen. The patient come back for follow-up visit in 2 weeks for reevaluation before the next cycle of her treatment. She was advised to call immediately if she has any concerning symptoms  in the interval. The patient voices understanding of current disease status and treatment options and is in agreement with the current care plan. All questions were answered. The patient  knows to call the clinic with any problems, questions or concerns. We can certainly see the patient much sooner if necessary. I spent 10 minutes counseling the patient face to face. The total time spent in the appointment was 15 minutes.  Disclaimer: This note was dictated with voice recognition software. Similar sounding words can inadvertently be transcribed and may not be corrected upon review.

## 2016-09-30 NOTE — Telephone Encounter (Signed)
Scheduled appt per 6/21 los - Gave patient AVS and calender per los.

## 2016-09-30 NOTE — Patient Instructions (Signed)
[facility] Discharge Instructions for Patients Receiving Chemotherapy  Today you received the following chemotherapy agents Opdivo.  To help prevent nausea and vomiting after your treatment, we encourage you to take your nausea medication.   If you develop nausea and vomiting that is not controlled by your nausea medication, call the clinic.   BELOW ARE SYMPTOMS THAT SHOULD BE REPORTED IMMEDIATELY:  *FEVER GREATER THAN 100.5 F  *CHILLS WITH OR WITHOUT FEVER  NAUSEA AND VOMITING THAT IS NOT CONTROLLED WITH YOUR NAUSEA MEDICATION  *UNUSUAL SHORTNESS OF BREATH  *UNUSUAL BRUISING OR BLEEDING  TENDERNESS IN MOUTH AND THROAT WITH OR WITHOUT PRESENCE OF ULCERS  *URINARY PROBLEMS  *BOWEL PROBLEMS  UNUSUAL RASH Items with * indicate a potential emergency and should be followed up as soon as possible.  Feel free to call the clinic you have any questions or concerns. The clinic phone number is [phone].  Please show the [facility] at check-in to the Emergency Department and triage nurse.

## 2016-09-30 NOTE — Addendum Note (Signed)
Addended by: Lucile Crater on: 09/30/2016 10:31 AM   Modules accepted: Orders

## 2016-10-04 ENCOUNTER — Telehealth: Payer: Self-pay | Admitting: Emergency Medicine

## 2016-10-04 DIAGNOSIS — J449 Chronic obstructive pulmonary disease, unspecified: Secondary | ICD-10-CM

## 2016-10-04 NOTE — Telephone Encounter (Signed)
Spoke with pt. She is in need of a new nebulizer machine. Order has been placed. Nothing further was needed.

## 2016-10-05 ENCOUNTER — Telehealth: Payer: Self-pay | Admitting: Emergency Medicine

## 2016-10-05 NOTE — Telephone Encounter (Signed)
RB, can you sign the neb order so that the patient can get her machine? Thanks.

## 2016-10-05 NOTE — Telephone Encounter (Signed)
done

## 2016-10-05 NOTE — Telephone Encounter (Signed)
Staff message was sent to [facility] yesterday I have called [name] at [phone] ext [phone] and left a message for him to return my call. [name] called back and stated they have it but cant do anything till RB signs it so when pt calls it looks as if they do not have it due to no signature.

## 2016-10-06 ENCOUNTER — Telehealth: Payer: Self-pay

## 2016-10-06 NOTE — Telephone Encounter (Signed)
Patient called for refills she will pick up all 3. She has a Risk manager [facility]. Wants refill on potassium, and vicodin (last 09/09/16. Wants refill on "mood pil" Citalopram 20 mg. This is not on her medication list. She couldn't understand this, has a bottle last refill 05/31/16. Looking through records, when she was seen [facility] 08/05/16 it was taken off list by assistance. When she came in to see Dr. Mariea Clonts 09/09/16 she mentioned to Dr. Mariea Clonts, it was still off the list. Will forward to Dr. Mariea Clonts.

## 2016-10-06 NOTE — Telephone Encounter (Signed)
Called patient and explain Dr. Magdalene Molly message, made appt for her to see Janett Billow Monday July 2nd at 2:45. Told her to bring all her bottles, we need to review all her medications, there has been a lot of confusion about what she takes and doesn't take. She will pick up her Rx for vicodin and potassium Friday 10/08/16.

## 2016-10-06 NOTE — Telephone Encounter (Signed)
Pt wanted several meds refilled at her last visit but they interact with one another.  I will need for her to come in to just go over her medication list to get it officially straightened out.  She should bring all of the pills he is taking along to the appt.  It can be with me or Janett Billow.

## 2016-10-08 ENCOUNTER — Other Ambulatory Visit: Payer: Self-pay | Admitting: *Deleted

## 2016-10-08 DIAGNOSIS — M51369 Other intervertebral disc degeneration, lumbar region without mention of lumbar back pain or lower extremity pain: Secondary | ICD-10-CM

## 2016-10-08 DIAGNOSIS — M25552 Pain in left hip: Secondary | ICD-10-CM

## 2016-10-08 DIAGNOSIS — M5136 Other intervertebral disc degeneration, lumbar region: Secondary | ICD-10-CM

## 2016-10-08 DIAGNOSIS — G44229 Chronic tension-type headache, not intractable: Secondary | ICD-10-CM

## 2016-10-08 DIAGNOSIS — G8929 Other chronic pain: Secondary | ICD-10-CM

## 2016-10-08 MED ORDER — HYDROCODONE-ACETAMINOPHEN 10-325 MG PO TABS: ORAL_TABLET | ORAL | 0 refills | Status: DC

## 2016-10-08 MED ORDER — POTASSIUM CHLORIDE CRYS ER 10 MEQ PO TBCR: 10.0000 meq | EXTENDED_RELEASE_TABLET | Freq: Every day | ORAL | 0 refills | Status: DC

## 2016-10-08 NOTE — Telephone Encounter (Signed)
Patient requested both Rx's to pick up

## 2016-10-11 ENCOUNTER — Ambulatory Visit (INDEPENDENT_AMBULATORY_CARE_PROVIDER_SITE_OTHER): Payer: Medicare Other | Admitting: Nurse Practitioner

## 2016-10-11 ENCOUNTER — Encounter: Payer: Self-pay | Admitting: Nurse Practitioner

## 2016-10-11 VITALS — BP 156/88 | HR 93 | Temp 98.1°F | Resp 18 | Ht 66.0 in | Wt 173.6 lb

## 2016-10-11 DIAGNOSIS — M5136 Other intervertebral disc degeneration, lumbar region: Secondary | ICD-10-CM | POA: Diagnosis not present

## 2016-10-11 DIAGNOSIS — F411 Generalized anxiety disorder: Secondary | ICD-10-CM | POA: Diagnosis not present

## 2016-10-11 DIAGNOSIS — K219 Gastro-esophageal reflux disease without esophagitis: Secondary | ICD-10-CM | POA: Diagnosis not present

## 2016-10-11 DIAGNOSIS — I779 Disorder of arteries and arterioles, unspecified: Secondary | ICD-10-CM | POA: Diagnosis not present

## 2016-10-11 DIAGNOSIS — J449 Chronic obstructive pulmonary disease, unspecified: Secondary | ICD-10-CM

## 2016-10-11 DIAGNOSIS — I10 Essential (primary) hypertension: Secondary | ICD-10-CM

## 2016-10-11 DIAGNOSIS — R1310 Dysphagia, unspecified: Secondary | ICD-10-CM

## 2016-10-11 DIAGNOSIS — M51369 Other intervertebral disc degeneration, lumbar region without mention of lumbar back pain or lower extremity pain: Secondary | ICD-10-CM

## 2016-10-11 MED ORDER — NYSTATIN 100000 UNIT/ML MT SUSP: 5.0000 mL | Freq: Four times a day (QID) | OROMUCOSAL | 0 refills | Status: DC

## 2016-10-11 MED ORDER — ALBUTEROL SULFATE HFA 108 (90 BASE) MCG/ACT IN AERS: 2.0000 | INHALATION_SPRAY | Freq: Four times a day (QID) | RESPIRATORY_TRACT | 0 refills | Status: DC | PRN

## 2016-10-11 MED ORDER — CITALOPRAM HYDROBROMIDE 20 MG PO TABS: 20.0000 mg | ORAL_TABLET | Freq: Every day | ORAL | 0 refills | Status: DC

## 2016-10-11 NOTE — Progress Notes (Signed)
Careteam: Patient Care Team: System, Pcp Not In as PCP - General Byrum, Rose Fillers, MD as Consulting Physician (Pulmonary Disease) Elam Dutch, MD as Consulting Physician (Vascular Surgery) Curt Bears, MD as Consulting Physician (Oncology)  Advanced Directive information Does Patient Have a Medical Advance Directive?: No  Allergies  Allergen Reactions  . Iohexol Shortness Of Breath and Other (See Comments)    "burns me up inside"    Pt does fine with 13 hour prep  01/17/12  . Other     CAN'T STAND THE SMELL OF PURE BLEACH; "HAVE TO BACK UP AND POUR AND DON'T BREATH IT TIL i GET CAP BACK ON; DILUTE IT TO SPRAY"  . Anoro Ellipta [Umeclidinium-Vilanterol] Swelling    Swelling of lips and mouth  . Codeine Itching and Nausea And Vomiting    REACTION: GI upset  . Protonix [Pantoprazole Sodium] Diarrhea    Chief Complaint  Patient presents with  . Medical Management of Chronic Issues    Pt is being seen for a medciaton review.      HPI: Patient is a 71 y.o. female seen in the office today for medication review. Pt with hx of lung cancer, COPD, chronic pain due to scoliosis and DDD, GERD, gastroparesis, PAD, HTN.  At last visit was requesting medication to be refilled that was not on her medication list.  She stated blood pressure running high at times. cardizem was restarted.  She was also restarted on Reglan due to gastroparesis.  Taking celexa which is not on her medication list.  Was started on ranitidine/Zantac but it effected her breathing so she stopped it.  Feels like her food getting stuck at times.  White coating in mouth that she can not get off with tooth brush- has taken magic mouthwash in the past.  Lot of indigestion/GERD- hx of ulcer feels like it may be coming back.  Felt like Protonix was not working any more so she stopped taking it. She can not remember what happened (intolerance indicates diarrhea)  Taking Reglan before meals but does not feel like  this has had much benefit. Does help her feelings of being stuck which is her biggest complaint  Has a sensation of food going down her throat but does not feel like she is swallowing anything Also feels like she has lots of sinus drainage. This is chronic. Yellow drainage from ears. Feels like water coming out and hurt deep down sometimes. Nothing today. Alternates between Claritin and zyrtec for congestion and allergies. Also takes flonase.    Taking celexa for her mood otherwise she is very agitated.  Takes xanax when she gets upset. Maybe will need 2 a day. Relaxes her so she can sleep at night when shes upset over the day.   Depending on the day she will take Vicodin or tramadol  Using 2-3 tablets of ultram a da Review of Systems:  Review of Systems  Constitutional: Positive for malaise/fatigue. Negative for chills and fever.  HENT: Negative for congestion.   Eyes: Negative for blurred vision.  Respiratory: Positive for cough, sputum production, shortness of breath and wheezing. Negative for hemoptysis.   Cardiovascular: Negative for chest pain and palpitations.       Leg pain  Gastrointestinal: Positive for heartburn. Negative for abdominal pain, blood in stool, constipation, diarrhea, melena, nausea and vomiting.  Genitourinary: Negative for dysuria.  Musculoskeletal: Positive for joint pain, myalgias and neck pain. Negative for falls.  Skin: Negative for itching and rash.  Neurological: Positive for tingling, sensory change and weakness. Negative for dizziness and loss of consciousness.  Endo/Heme/Allergies:       Diabetes  Psychiatric/Behavioral: Positive for depression. Negative for suicidal ideas. The patient is nervous/anxious and has insomnia.     Past Medical History:  Diagnosis Date  . Acute posthemorrhagic anemia   . Acute sinusitis, unspecified   . Acute upper respiratory infections of unspecified site   . Adenocarcinoma, lung ([place])    upper left lobe  . Anemia in  neoplastic disease   . Anemia, unspecified   . Anginal pain ([place])   . Anxiety   . Anxiety state, unspecified   . Arthritis    "all over my body"  . Asthma   . Asymptomatic varicose veins   . Atherosclerosis of native arteries of the extremities, unspecified   . Atrial fibrillation ([place])   . Bronchial pneumonia    history  . 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 Chemotherapy adverse reaction 09/15/11   "makes me light headed and pass out; this is 3rd blood transfusion since going thru it"  . CHF (congestive heart failure) ([place])   . Chronic airway obstruction, not elsewhere classified   . Chronic fatigue 08/19/2015  . Chronic sinusitis   . Complication of anesthesia    slow to wake up  . COPD (chronic obstructive pulmonary disease) ([place])   . Coronary artery disease    Dr. Burt Knack had stress test done  . DDD (degenerative disc disease), lumbar   . Depressive disorder, not elsewhere classified   . Diseases of lips   . Disorder of bone and cartilage, unspecified   . Dizziness and giddiness   . DVT (deep venous thrombosis) ([place])   . Dysrhythmia    irregular  . Effects of radiation, unspecified   . Emphysema   . Encounter for antineoplastic immunotherapy 08/19/2015  . Fibromyalgia   . Functional urinary incontinence   . GERD (gastroesophageal reflux disease)   . Hard of hearing, right   . Heart murmur   . Herpes zoster without mention of complication   . Hip pain, chronic 09/17/2014  . History of blood clots    "2 in my aorta"  . History of blood transfusion   . History of bronchitis   . Hx of radiation therapy 04/28/11 -06/08/11   right upper lobe-lung  . Hyperlipidemia   . Hyperpotassemia   . Hypertension   . Hypopotassemia   . Kidney infection    "related to chemo"  . Lumbago   . Malignant neoplasm of bronchus and lung, unspecified site   . Mental disorder   . Muscle weakness (generalized)   . Muscle weakness (generalized)   . Non-small cell lung cancer ([place])  03/31/2011   Adenocarcinoma Rt upper lobe mass  . Osteoarthrosis, unspecified whether generalized or localized, unspecified site   . Osteoporosis   . Other abnormal blood chemistry   . Other and unspecified hyperlipidemia   . PAD (peripheral artery disease) ([place])   . Pain in thoracic spine   . Peripheral vascular disease ([place])   . 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 Primary cancer of right upper lobe of lung ([place]) 02/08/2011   Biopsy Rt upper lobe mass 03/31/11>>adenocarcinoma DIAGNOSIS: Stage IIB/IIIA non-small cell lung cancer consistent with adenocarcinoma diagnosed in December 2012.   PRIOR THERAPY:  1) Status post radiation therapy to the right upper lobe lung mass completed on 06/04/2011 under the care of Dr. Pablo Ledger.  2) Systemic  chemotherapy with carboplatin for AUC of 5 and Alimta 500 mg/M2 giving every 3   . Reflux   . Reflux esophagitis   . Respiratory failure with hypoxia 01/02/2010  . Restless legs syndrome (RLS)   . Scoliosis   . Screening for thyroid disorder   . Shortness of breath    "anytime really"  . Sinus headache    "all the time"  . Spasm of muscle   . Syncope and collapse   . Type II or unspecified type diabetes mellitus with neurological manifestations, uncontrolled(250.62)   . Type II or unspecified type diabetes mellitus without mention of complication, not stated as uncontrolled   . Type II or unspecified type diabetes mellitus without mention of complication, not stated as uncontrolled   . Unspecified constipation   . Unspecified essential hypertension   . Unspecified hearing loss   . Unspecified hereditary and idiopathic peripheral neuropathy   . Unspecified vitamin D deficiency   . Urinary tract infection, site not specified    Past Surgical History:  Procedure Laterality Date  . BRONCHOSCOPY  02/2011  . CARDIAC CATHETERIZATION    . CATARACT EXTRACTION  03/12/2013   Left Eye   . DILATION AND CURETTAGE OF UTERUS  1980's  . EYE SURGERY Left  2014   Cataract  . FETAL BLOOD TRANSFUSION  09/16/11  . JOINT REPLACEMENT  04/13/10   Left Hip  . LUNG LOBECTOMY  1986   left  . SEPTOPLASTY    . TOTAL HIP ARTHROPLASTY  01/2010   left  . TUBAL LIGATION  1980's   Social History:   reports that she quit smoking about 18 years ago. Her smoking use included Cigarettes. She has a 40.00 pack-year smoking history. She has never used smokeless tobacco. She reports that she does not drink alcohol or use drugs.  Family History  Problem Relation Age of Onset  . Bone cancer Father   . Cancer Father        Bone  . Diabetes Mother   . Deep vein thrombosis Mother   . Heart disease Mother        Heart Disease before age 54-  PVD  . Hyperlipidemia Mother   . Hypertension Mother   . Varicose Veins Mother   . Arthritis Daughter     Medications: Patient's Medications  New Prescriptions   No medications on file  Previous Medications   ACCU-CHEK SOFTCLIX LANCETS LANCETS    by Other route. Check blood sugar twice daily. Dx: E11.9, E11.49   ALBUTEROL (PROVENTIL) (2.5 MG/3ML) 0.083% NEBULIZER SOLUTION    Take 3 mLs (2.5 mg total) by nebulization every 6 (six) hours as needed for wheezing or shortness of breath.   ALPRAZOLAM (XANAX) 1 MG TABLET    TAKE 1 TABLET BY MOUTH THREE TIMES A DAY AS NEEDED FOR ANXIETY   ASPIRIN 81 MG TABLET    Take 81 mg by mouth daily.   BLOOD GLUCOSE MONITORING SUPPL (ACCU-CHEK AVIVA PLUS) W/DEVICE KIT    Use as directed to check blood sugar Dx: E11.9, E11.49   CA PHOSPHATE-CHOLECALCIFEROL (CALTRATE GUMMY BITES) 250-400 MG-UNIT CHEW    Chew 1 each by mouth daily.   CETIRIZINE HCL 10 MG CAPS    Take 1 capsule by mouth daily.    CITALOPRAM (CELEXA) 20 MG TABLET    Take 20 mg by mouth daily.   DILTIAZEM (MATZIM LA) 240 MG 24 HR TABLET    Take 240 mg by mouth daily.   FLUTICASONE (FLONASE)  50 MCG/ACT NASAL SPRAY    INSTILL 2 SPRAYS IN EACH NOSTRIL TWICE DAILY *NEEDS APPOINTMENT*   HYDROCODONE-ACETAMINOPHEN (NORCO) 10-325 MG  TABLET    Take One tablet by mouth every 6 hours if needed for pain   IPRATROPIUM-ALBUTEROL (COMBIVENT RESPIMAT) 20-100 MCG/ACT AERS RESPIMAT    Inhale 2 puffs into the lungs 4 (four) times daily.   METFORMIN (GLUCOPHAGE) 500 MG TABLET    Take 1 tablet (500 mg total) by mouth daily as needed (high blood sugar- 160+). Take one tablet at night for blood sugar   MULTIPLE VITAMINS-MINERALS (CENTRUM SILVER PO)    Take 1 tablet by mouth daily. Patient unsure of dose   POTASSIUM CHLORIDE (K-DUR,KLOR-CON) 10 MEQ TABLET    Take 1 tablet (10 mEq total) by mouth daily.   TRAMADOL (ULTRAM) 50 MG TABLET    Take 2 tablets (100 mg total) by mouth every 6 (six) hours as needed for severe pain.   TRIAMTERENE-HYDROCHLOROTHIAZIDE (MAXZIDE-25) 37.5-25 MG TABLET    Take 1 tablet by mouth daily.  Modified Medications   No medications on file  Discontinued Medications   LORATADINE-PSEUDOEPHEDRINE (CLARITIN-D 12-HOUR) 5-120 MG TABLET    One up to twice daily for allergy and congestion   METOCLOPRAMIDE (REGLAN) 5 MG TABLET    Take 1 tablet (5 mg total) by mouth 4 (four) times daily.   RANITIDINE (ZANTAC) 300 MG TABLET         Physical Exam:  Vitals:   10/11/16 1452  BP: (!) 156/88  Pulse: 93  Resp: 18  Temp: 98.1 F (36.7 C)  TempSrc: Oral  SpO2: 95%  Weight: 173 lb 9.6 oz (78.7 kg)  Height: 5' 6"  (1.676 m)   Body mass index is 28.02 kg/m.  Physical Exam  Constitutional: She is oriented to person, place, and time. No distress.  HENT:  Head: Normocephalic and atraumatic.  Pulmonary/Chest: Effort normal and breath sounds normal.  Using portable O2  Abdominal: Soft. Bowel sounds are normal.  Musculoskeletal: She exhibits no edema.  Neurological: She is alert and oriented to person, place, and time.  Skin: Skin is warm and dry.  Psychiatric:  Anxious, pressured speech    Labs reviewed: Basic Metabolic Panel:  Recent Labs  02/18/16 1149  03/08/16 1717  06/25/16 0923  08/31/16 1148  09/17/16 1301 09/17/16 1301 09/30/16 0831  NA 143  < > 138  < >  --   < > 140  --  143 138  K 3.3*  < > 3.1*  < >  --   < > 4.2  --  4.1 3.8  CL  --   --  95*  --   --   --   --   --   --   --   CO2 29  < >  --   < >  --   < > 26  --  26 27  GLUCOSE 119  < > 123*  < >  --   < > 97  --  95 96  BUN 16.3  < > 13  < >  --   < > 13.6  --  12.5 14.0  CREATININE 1.2*  < > 1.00  < >  --   < > 1.0  --  1.1 1.0  CALCIUM 10.6*  < >  --   < >  --   < > 9.8  --  9.8 9.9  TSH 2.208  --   --   --  2.384  --   --  1.967  --   --   < > = values in this interval not displayed. Liver Function Tests:  Recent Labs  08/31/16 1148 09/17/16 1301 09/30/16 0831  AST 18 18 16   ALT 13 9 11   ALKPHOS 91 86 79  BILITOT 0.51 0.54 0.57  PROT 6.1* 6.5 6.6  ALBUMIN 3.5 3.7 3.8   No results for input(s): LIPASE, AMYLASE in the last 8760 hours. No results for input(s): AMMONIA in the last 8760 hours. CBC:  Recent Labs  08/31/16 1148 09/17/16 1301 09/30/16 0831  WBC 8.6 9.6 9.5  NEUTROABS 5.4 5.9 5.6  HGB 12.6 12.6 12.6  HCT 37.5 37.8 37.6  MCV 90.2 90.3 90.4  PLT 206 274 226   Lipid Panel: No results for input(s): CHOL, HDL, LDLCALC, TRIG, CHOLHDL, LDLDIRECT in the last 8760 hours. TSH:  Recent Labs  02/18/16 1149 06/25/16 0923 09/17/16 1301  TSH 2.208 2.384 1.967   A1C: Lab Results  Component Value Date   HGBA1C 5.5 08/06/2015     Assessment/Plan 1. Dysphagia, unspecified type -- SLP modified barium swallow; Future for further evaluation of swallowing -hx of thrush and currently with white coating, will treat with nystatin (MYCOSTATIN) 100000 UNIT/ML suspension; Use as directed 5 mLs (500,000 Units total) in the mouth or throat 4 (four) times daily.  Dispense: 60 mL; Refill: 0 which also may help feelings of dysphagia.   2. Chronic obstructive pulmonary disease, unspecified COPD type ([place]) -conts to be followed with pulmonary. Has nebulizer but not working at this time. Insurance will  not pay for replacement until next month so needs rescue inhaler in the meantime.  - albuterol (PROVENTIL HFA;VENTOLIN HFA) 108 (90 Base) MCG/ACT inhaler; Inhale 2 puffs into the lungs every 6 (six) hours as needed for wheezing or shortness of breath.  Dispense: 1 Inhaler; Refill: 0  3. Anxiety state - citalopram (CELEXA) 20 MG tablet; Take 1 tablet (20 mg total) by mouth daily.  Dispense: 30 tablet; Refill: 0 -conts on xanax PRN  4. Gastroesophageal reflux disease, esophagitis presence not specified -reports most of her issues with trouble swallowing. She is not taking zantac at this time and states Reglan was mildly beneficial. Due to interactions with other medications will DC Reglan at this time.   5. Essential hypertension Still slightly elevated today. conts on diltiazem and triamterene-hctz  6. DDD (degenerative disc disease), lumbar -uses tramadol and hydrocodone/apap Reports using tramadol for mild pain and hydrocodone for mod-severe On averages only uses ~3 tramadol tablets a day   Yahaira Bruski K. Harle Battiest  [facility] [phone] 8 am - 5 pm) [phone] (after hours)

## 2016-10-11 NOTE — Patient Instructions (Addendum)
Refill provided for celexa  To start nystatin oral 5 cc 4 times daily- swish and swallow   Prescription sent for albuterol inhaler to use until your nebulizer is replaced   Keep follow up with Dr Mariea Clonts next month

## 2016-10-12 ENCOUNTER — Ambulatory Visit: Payer: Medicare Other

## 2016-10-12 ENCOUNTER — Ambulatory Visit: Payer: Medicare Other | Admitting: Internal Medicine

## 2016-10-12 ENCOUNTER — Telehealth: Payer: Self-pay

## 2016-10-12 ENCOUNTER — Other Ambulatory Visit: Payer: Medicare Other

## 2016-10-12 ENCOUNTER — Other Ambulatory Visit: Payer: Self-pay | Admitting: Nurse Practitioner

## 2016-10-12 NOTE — Telephone Encounter (Signed)
Per Jessica's verbal request I called [facility], left message requesting clarification on covered medications. Our system converts Proventil and Ventolin as being the same medication.

## 2016-10-12 NOTE — Telephone Encounter (Signed)
Proventil HFA is not covered by patient's plan. The preferred alternative is Ventolin FA Aer or Xoprnex HF Aer. Please call.fax the pharmacy to change medication along with strength, directions, quantity and refills

## 2016-10-12 NOTE — Telephone Encounter (Signed)
I left an additional message due to no follow-up from [facility] requesting clarification on alternative medications

## 2016-10-14 ENCOUNTER — Other Ambulatory Visit (HOSPITAL_BASED_OUTPATIENT_CLINIC_OR_DEPARTMENT_OTHER): Payer: Medicare Other

## 2016-10-14 ENCOUNTER — Ambulatory Visit (HOSPITAL_BASED_OUTPATIENT_CLINIC_OR_DEPARTMENT_OTHER): Payer: Medicare Other

## 2016-10-14 ENCOUNTER — Telehealth: Payer: Self-pay | Admitting: Internal Medicine

## 2016-10-14 ENCOUNTER — Encounter: Payer: Self-pay | Admitting: Internal Medicine

## 2016-10-14 ENCOUNTER — Ambulatory Visit (HOSPITAL_BASED_OUTPATIENT_CLINIC_OR_DEPARTMENT_OTHER): Payer: Medicare Other | Admitting: Internal Medicine

## 2016-10-14 VITALS — BP 135/54 | HR 84 | Temp 98.5°F | Resp 19 | Ht 66.0 in | Wt 173.3 lb

## 2016-10-14 DIAGNOSIS — C3411 Malignant neoplasm of upper lobe, right bronchus or lung: Secondary | ICD-10-CM | POA: Diagnosis not present

## 2016-10-14 DIAGNOSIS — Z5112 Encounter for antineoplastic immunotherapy: Secondary | ICD-10-CM

## 2016-10-14 DIAGNOSIS — R0602 Shortness of breath: Secondary | ICD-10-CM | POA: Diagnosis not present

## 2016-10-14 DIAGNOSIS — J449 Chronic obstructive pulmonary disease, unspecified: Secondary | ICD-10-CM

## 2016-10-14 DIAGNOSIS — Z79899 Other long term (current) drug therapy: Secondary | ICD-10-CM

## 2016-10-14 DIAGNOSIS — Z9981 Dependence on supplemental oxygen: Secondary | ICD-10-CM

## 2016-10-14 DIAGNOSIS — R5382 Chronic fatigue, unspecified: Secondary | ICD-10-CM

## 2016-10-14 LAB — CBC WITH DIFFERENTIAL/PLATELET
BASO%: 0.3 % (ref 0.0–2.0)
Basophils Absolute: 0 10*3/uL (ref 0.0–0.1)
EOS%: 10.5 % — AB (ref 0.0–7.0)
Eosinophils Absolute: 0.9 10*3/uL — ABNORMAL HIGH (ref 0.0–0.5)
HEMATOCRIT: 35 % (ref 34.8–46.6)
HGB: 11.3 g/dL — ABNORMAL LOW (ref 11.6–15.9)
LYMPH#: 1.8 10*3/uL (ref 0.9–3.3)
LYMPH%: 20.1 % (ref 14.0–49.7)
MCH: 30.1 pg (ref 25.1–34.0)
MCHC: 32.3 g/dL (ref 31.5–36.0)
MCV: 93.1 fL (ref 79.5–101.0)
MONO#: 0.8 10*3/uL (ref 0.1–0.9)
MONO%: 9 % (ref 0.0–14.0)
NEUT#: 5.3 10*3/uL (ref 1.5–6.5)
NEUT%: 60.1 % (ref 38.4–76.8)
Platelets: 209 10*3/uL (ref 145–400)
RBC: 3.76 10*6/uL (ref 3.70–5.45)
RDW: 13 % (ref 11.2–14.5)
WBC: 8.8 10*3/uL (ref 3.9–10.3)

## 2016-10-14 LAB — COMPREHENSIVE METABOLIC PANEL
ALT: 8 U/L (ref 0–55)
AST: 15 U/L (ref 5–34)
Albumin: 3.5 g/dL (ref 3.5–5.0)
Alkaline Phosphatase: 84 U/L (ref 40–150)
Anion Gap: 10 mEq/L (ref 3–11)
BUN: 11.5 mg/dL (ref 7.0–26.0)
CALCIUM: 9.6 mg/dL (ref 8.4–10.4)
CHLORIDE: 104 meq/L (ref 98–109)
CO2: 29 meq/L (ref 22–29)
CREATININE: 1 mg/dL (ref 0.6–1.1)
EGFR: 60 mL/min/{1.73_m2} — ABNORMAL LOW (ref >90)
GLUCOSE: 97 mg/dL (ref 70–140)
POTASSIUM: 4.2 meq/L (ref 3.5–5.1)
SODIUM: 142 meq/L (ref 136–145)
Total Bilirubin: 0.43 mg/dL (ref 0.20–1.20)
Total Protein: 6.3 g/dL — ABNORMAL LOW (ref 6.4–8.3)

## 2016-10-14 LAB — TSH: TSH: 2.776 m[IU]/L (ref 0.308–3.960)

## 2016-10-14 MED ORDER — SODIUM CHLORIDE 0.9 % IV SOLN
Freq: Once | INTRAVENOUS | Status: AC
  Administered 2016-10-14: 11:00:00 via INTRAVENOUS

## 2016-10-14 MED ORDER — SODIUM CHLORIDE 0.9 % IV SOLN
240.0000 mg | Freq: Once | INTRAVENOUS | Status: AC
  Administered 2016-10-14: 240 mg via INTRAVENOUS
  Filled 2016-10-14: qty 24

## 2016-10-14 NOTE — Telephone Encounter (Signed)
Scheduled appt per 7/5 los - Gave patient AVS and calender per los.

## 2016-10-14 NOTE — Patient Instructions (Signed)
[facility] Discharge Instructions for Patients Receiving Chemotherapy  Today you received the following chemotherapy agents Opdivo.  To help prevent nausea and vomiting after your treatment, we encourage you to take your nausea medication.   If you develop nausea and vomiting that is not controlled by your nausea medication, call the clinic.   BELOW ARE SYMPTOMS THAT SHOULD BE REPORTED IMMEDIATELY:  *FEVER GREATER THAN 100.5 F  *CHILLS WITH OR WITHOUT FEVER  NAUSEA AND VOMITING THAT IS NOT CONTROLLED WITH YOUR NAUSEA MEDICATION  *UNUSUAL SHORTNESS OF BREATH  *UNUSUAL BRUISING OR BLEEDING  TENDERNESS IN MOUTH AND THROAT WITH OR WITHOUT PRESENCE OF ULCERS  *URINARY PROBLEMS  *BOWEL PROBLEMS  UNUSUAL RASH Items with * indicate a potential emergency and should be followed up as soon as possible.  Feel free to call the clinic you have any questions or concerns. The clinic phone number is [phone].  Please show the [place] at check-in to the Emergency Department and triage nurse.

## 2016-10-14 NOTE — Progress Notes (Signed)
[name] [phone]   [phone]  OFFICE PROGRESS NOTE  [name], NP [place] [address]  DIAGNOSIS: Recurrent non-small cell lung cancer initially diagnosed as a stage IIA adenocarcinoma in December 2012 with no actionable mutations.   PRIOR THERAPY: 1) Status post radiation therapy to the right upper lobe lung mass completed on 06/04/2011 under the care of Dr. Pablo Ledger.  2) Systemic chemotherapy with carboplatin for AUC of 5 and Alimta 500 mg/M2 giving every 3 weeks, status post 3 cycles, last dose was given on 07/21/2011.  3) Systemic chemotherapy with carboplatin for AUC of 5 and Alimta 500 MG/M2 every 3 weeks. First dose on 01/02/2015. Status post 2 cycles, last dose was given 01/30/2015. Her treatment is currently on hold secondary to intolerance. 4) Immunotherapy with Nivolumab 240 M IV every 2 weeks status post 14 cycles.   CURRENT THERAPY: The patient will resume her treatment again with immunotherapy with Nivolumab 240 mg IV every 2 weeks. First dose 09/15/2016. Status post 2 cycles  INTERVAL HISTORY: [name] 71 y.o. female returns to the clinic today for follow-up visit accompanied by her son. The patient is feeling fine today with no specific complaints except for the baseline shortness of breath and she is currently on home oxygen. She tolerated the second cycle of her treatment fairly well with no significant adverse effects. She denied having any chest pain, cough or hemoptysis. She denied having any weight loss or night sweats. She has no nausea, vomiting, diarrhea or constipation. She is here today for evaluation before starting cycle #3.  MEDICAL HISTORY: Past Medical History:  Diagnosis Date  . Acute posthemorrhagic anemia   . Acute sinusitis, unspecified   . Acute upper respiratory infections of unspecified site   . Adenocarcinoma, lung ([place])    upper left lobe  . Anemia in neoplastic  disease   . Anemia, unspecified   . Anginal pain ([place])   . Anxiety   . Anxiety state, unspecified   . Arthritis    "all over my body"  . Asthma   . Asymptomatic varicose veins   . Atherosclerosis of native arteries of the extremities, unspecified   . Atrial fibrillation ([place])   . Bronchial pneumonia    history  . 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 Chemotherapy adverse reaction 09/15/11   "makes me light headed and pass out; this is 3rd blood transfusion since going thru it"  . CHF (congestive heart failure) ([place])   . Chronic airway obstruction, not elsewhere classified   . Chronic fatigue 08/19/2015  . Chronic sinusitis   . Complication of anesthesia    slow to wake up  . COPD (chronic obstructive pulmonary disease) ([place])   . Coronary artery disease    Dr. Burt Knack had stress test done  . DDD (degenerative disc disease), lumbar   . Depressive disorder, not elsewhere classified   . Diseases of lips   . Disorder of bone and cartilage, unspecified   . Dizziness and giddiness   . DVT (deep venous thrombosis) ([place])   . Dysrhythmia    irregular  . Effects of radiation, unspecified   . Emphysema   . Encounter for antineoplastic immunotherapy 08/19/2015  . Fibromyalgia   . Functional urinary incontinence   . GERD (gastroesophageal reflux disease)   . Hard of hearing, right   . Heart murmur   . Herpes zoster without mention of complication   .  Hip pain, chronic 09/17/2014  . History of blood clots    "2 in my aorta"  . History of blood transfusion   . History of bronchitis   . Hx of radiation therapy 04/28/11 -06/08/11   right upper lobe-lung  . Hyperlipidemia   . Hyperpotassemia   . Hypertension   . Hypopotassemia   . Kidney infection    "related to chemo"  . Lumbago   . Malignant neoplasm of bronchus and lung, unspecified site   . Mental disorder   . Muscle weakness (generalized)   . Muscle weakness (generalized)   . Non-small cell lung cancer ([place]) 03/31/2011    Adenocarcinoma Rt upper lobe mass  . Osteoarthrosis, unspecified whether generalized or localized, unspecified site   . Osteoporosis   . Other abnormal blood chemistry   . Other and unspecified hyperlipidemia   . PAD (peripheral artery disease) ([place])   . Pain in thoracic spine   . Peripheral vascular disease ([place])   . 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 Primary cancer of right upper lobe of lung ([place]) 02/08/2011   Biopsy Rt upper lobe mass 03/31/11>>adenocarcinoma DIAGNOSIS: Stage IIB/IIIA non-small cell lung cancer consistent with adenocarcinoma diagnosed in December 2012.   PRIOR THERAPY:  1) Status post radiation therapy to the right upper lobe lung mass completed on 06/04/2011 under the care of Dr. Pablo Ledger.  2) Systemic chemotherapy with carboplatin for AUC of 5 and Alimta 500 mg/M2 giving every 3   . Reflux   . Reflux esophagitis   . Respiratory failure with hypoxia 01/02/2010  . Restless legs syndrome (RLS)   . Scoliosis   . Screening for thyroid disorder   . Shortness of breath    "anytime really"  . Sinus headache    "all the time"  . Spasm of muscle   . Syncope and collapse   . Type II or unspecified type diabetes mellitus with neurological manifestations, uncontrolled(250.62)   . Type II or unspecified type diabetes mellitus without mention of complication, not stated as uncontrolled   . Type II or unspecified type diabetes mellitus without mention of complication, not stated as uncontrolled   . Unspecified constipation   . Unspecified essential hypertension   . Unspecified hearing loss   . Unspecified hereditary and idiopathic peripheral neuropathy   . Unspecified vitamin D deficiency   . Urinary tract infection, site not specified     ALLERGIES:  is allergic to iohexol; other; anoro ellipta [umeclidinium-vilanterol]; codeine; and protonix [pantoprazole sodium].  MEDICATIONS:  Current Outpatient Prescriptions  Medication Sig Dispense Refill  . ACCU-CHEK  SOFTCLIX LANCETS lancets by Other route. Check blood sugar twice daily. Dx: E11.9, E11.49    . albuterol (PROVENTIL HFA;VENTOLIN HFA) 108 (90 Base) MCG/ACT inhaler Inhale 2 puffs into the lungs every 6 (six) hours as needed for wheezing or shortness of breath. 1 Inhaler 0  . albuterol (PROVENTIL) (2.5 MG/3ML) 0.083% nebulizer solution Take 3 mLs (2.5 mg total) by nebulization every 6 (six) hours as needed for wheezing or shortness of breath. (Patient not taking: Reported on 10/11/2016) 75 mL 5  . ALPRAZolam (XANAX) 1 MG tablet TAKE 1 TABLET BY MOUTH THREE TIMES A DAY AS NEEDED FOR ANXIETY 90 tablet 0  . aspirin 81 MG tablet Take 81 mg by mouth daily.    . Blood Glucose Monitoring Suppl (ACCU-CHEK AVIVA PLUS) w/Device KIT Use as directed to check blood sugar Dx: E11.9, E11.49 1 kit 0  . Ca Phosphate-Cholecalciferol (CALTRATE GUMMY BITES) 250-400 MG-UNIT CHEW  Chew 1 each by mouth daily.    . Cetirizine HCl 10 MG CAPS Take 1 capsule by mouth daily.     . citalopram (CELEXA) 20 MG tablet Take 1 tablet (20 mg total) by mouth daily. 30 tablet 0  . diltiazem (MATZIM LA) 240 MG 24 hr tablet Take 240 mg by mouth daily.    . fluticasone (FLONASE) 50 MCG/ACT nasal spray INSTILL 2 SPRAYS IN EACH NOSTRIL TWICE DAILY *NEEDS APPOINTMENT* 16 g 5  . HYDROcodone-acetaminophen (NORCO) 10-325 MG tablet Take One tablet by mouth every 6 hours if needed for pain 120 tablet 0  . Ipratropium-Albuterol (COMBIVENT RESPIMAT) 20-100 MCG/ACT AERS respimat Inhale 2 puffs into the lungs 4 (four) times daily. 2 Inhaler 5  . metFORMIN (GLUCOPHAGE) 500 MG tablet Take 1 tablet (500 mg total) by mouth daily as needed (high blood sugar- 160+). Take one tablet at night for blood sugar 60 tablet 5  . Multiple Vitamins-Minerals (CENTRUM SILVER PO) Take 1 tablet by mouth daily. Patient unsure of dose    . nystatin (MYCOSTATIN) 100000 UNIT/ML suspension Use as directed 5 mLs (500,000 Units total) in the mouth or throat 4 (four) times daily. 60  mL 0  . potassium chloride (K-DUR,KLOR-CON) 10 MEQ tablet Take 1 tablet (10 mEq total) by mouth daily. 30 tablet 0  . traMADol (ULTRAM) 50 MG tablet Take 2 tablets (100 mg total) by mouth every 6 (six) hours as needed for severe pain. 90 tablet 1  . triamterene-hydrochlorothiazide (MAXZIDE-25) 37.5-25 MG tablet Take 1 tablet by mouth daily.     No current facility-administered medications for this visit.    Facility-Administered Medications Ordered in Other Visits  Medication Dose Route Frequency Provider Last Rate Last Dose  . hyaluronate sodium (RADIAPLEXRX) gel   Topical Once Thea Silversmith, MD      . topical emolient (BIAFINE) emulsion   Topical PRN Thea Silversmith, MD        SURGICAL HISTORY:  Past Surgical History:  Procedure Laterality Date  . BRONCHOSCOPY  02/2011  . CARDIAC CATHETERIZATION    . CATARACT EXTRACTION  03/12/2013   Left Eye   . DILATION AND CURETTAGE OF UTERUS  1980's  . EYE SURGERY Left 2014   Cataract  . FETAL BLOOD TRANSFUSION  09/16/11  . JOINT REPLACEMENT  04/13/10   Left Hip  . LUNG LOBECTOMY  1986   left  . SEPTOPLASTY    . TOTAL HIP ARTHROPLASTY  01/2010   left  . TUBAL LIGATION  1980's    REVIEW OF SYSTEMS:  A comprehensive review of systems was negative except for: Constitutional: positive for fatigue Respiratory: positive for dyspnea on exertion   PHYSICAL EXAMINATION: General appearance: alert, cooperative, fatigued and mild distress Head: Normocephalic, without obvious abnormality, atraumatic Neck: no adenopathy, no JVD, supple, symmetrical, trachea midline and thyroid not enlarged, symmetric, no tenderness/mass/nodules Lymph nodes: Cervical, supraclavicular, and axillary nodes normal. Resp: wheezes bilaterally Back: symmetric, no curvature. ROM normal. No CVA tenderness. Cardio: regular rate and rhythm, S1, S2 normal, no murmur, click, rub or gallop GI: soft, non-tender; bowel sounds normal; no masses,  no organomegaly Extremities:  extremities normal, atraumatic, no cyanosis or edema  ECOG PERFORMANCE STATUS: 1 - Symptomatic but completely ambulatory  Blood pressure (!) 135/54, pulse 84, temperature 98.5 F (36.9 C), temperature source Oral, resp. rate 19, height _0  (1.676 m), weight 173 lb 4.8 oz (78.6 kg), SpO2 99 %.  LABORATORY DATA: Lab Results  Component Value Date   WBC 8.8  10/14/2016   HGB 11.3 (L) 10/14/2016   HCT 35.0 10/14/2016   MCV 93.1 10/14/2016   PLT 209 10/14/2016      Chemistry      Component Value Date/Time   NA 138 09/30/2016 0831   K 3.8 09/30/2016 0831   CL 95 (L) 03/08/2016 1717   CL 99 07/21/2012 1125   CO2 27 09/30/2016 0831   BUN 14.0 09/30/2016 0831   CREATININE 1.0 09/30/2016 0831      Component Value Date/Time   CALCIUM 9.9 09/30/2016 0831   ALKPHOS 79 09/30/2016 0831   AST 16 09/30/2016 0831   ALT 11 09/30/2016 0831   BILITOT 0.57 09/30/2016 0831       RADIOGRAPHIC STUDIES: No results found.  ASSESSMENT AND PLAN:  This is a very pleasant 71 years old white female with recurrent non-small cell lung cancer, adenocarcinoma that was initially diagnosed as unresectable stage IIA in December 2012 status post systemic chemotherapy as well as immunotherapy. She is currently on treatment with Nivolumab status post 2 cycles of the new regimen. She is tolerating the treatment well with no significant adverse effects. I recommended for her to proceed with cycle #3 today as a scheduled. I would see her back for follow-up visit in 2 weeks for evaluation before starting cycle #4. She was advised to call immediately if she has any concerning symptoms in the interval. The patient voices understanding of current disease status and treatment options and is in agreement with the current care plan. All questions were answered. The patient knows to call the clinic with any problems, questions or concerns. We can certainly see the patient much sooner if necessary. I spent 10 minutes  counseling the patient face to face. The total time spent in the appointment was 15 minutes.  Disclaimer: This note was dictated with voice recognition software. Similar sounding words can inadvertently be transcribed and may not be corrected upon review.

## 2016-10-14 NOTE — Telephone Encounter (Signed)
Thank you :)

## 2016-10-15 ENCOUNTER — Other Ambulatory Visit: Payer: Self-pay | Admitting: Emergency Medicine

## 2016-10-18 NOTE — Telephone Encounter (Signed)
Refilled this time only-will go through [facility] or PCP from here on out.

## 2016-10-19 NOTE — Telephone Encounter (Signed)
Spoke with patient, patient states the pharmacy gave her ventolin inhaler and stated it is the same as Proventil

## 2016-10-20 ENCOUNTER — Other Ambulatory Visit (HOSPITAL_COMMUNITY): Payer: Self-pay | Admitting: Nurse Practitioner

## 2016-10-20 DIAGNOSIS — R1310 Dysphagia, unspecified: Secondary | ICD-10-CM

## 2016-10-22 ENCOUNTER — Other Ambulatory Visit: Payer: Self-pay | Admitting: Internal Medicine

## 2016-10-28 ENCOUNTER — Other Ambulatory Visit (HOSPITAL_BASED_OUTPATIENT_CLINIC_OR_DEPARTMENT_OTHER): Payer: Medicare Other

## 2016-10-28 ENCOUNTER — Ambulatory Visit (HOSPITAL_BASED_OUTPATIENT_CLINIC_OR_DEPARTMENT_OTHER): Payer: Medicare Other

## 2016-10-28 ENCOUNTER — Ambulatory Visit: Payer: Medicare Other | Admitting: Nutrition

## 2016-10-28 ENCOUNTER — Telehealth: Payer: Self-pay | Admitting: Nurse Practitioner

## 2016-10-28 ENCOUNTER — Ambulatory Visit (HOSPITAL_BASED_OUTPATIENT_CLINIC_OR_DEPARTMENT_OTHER): Payer: Medicare Other | Admitting: Nurse Practitioner

## 2016-10-28 VITALS — BP 126/41 | HR 64 | Resp 18

## 2016-10-28 VITALS — BP 96/68 | HR 78 | Temp 98.6°F | Resp 17 | Ht 66.0 in | Wt 170.7 lb

## 2016-10-28 DIAGNOSIS — L299 Pruritus, unspecified: Secondary | ICD-10-CM

## 2016-10-28 DIAGNOSIS — Z5112 Encounter for antineoplastic immunotherapy: Secondary | ICD-10-CM | POA: Diagnosis present

## 2016-10-28 DIAGNOSIS — C3411 Malignant neoplasm of upper lobe, right bronchus or lung: Secondary | ICD-10-CM

## 2016-10-28 LAB — CBC WITH DIFFERENTIAL/PLATELET
BASO%: 0.9 % (ref 0.0–2.0)
Basophils Absolute: 0.1 10*3/uL (ref 0.0–0.1)
EOS%: 11.6 % — AB (ref 0.0–7.0)
Eosinophils Absolute: 1 10*3/uL — ABNORMAL HIGH (ref 0.0–0.5)
HCT: 37.8 % (ref 34.8–46.6)
HGB: 12.6 g/dL (ref 11.6–15.9)
LYMPH%: 25.5 % (ref 14.0–49.7)
MCH: 30.4 pg (ref 25.1–34.0)
MCHC: 33.3 g/dL (ref 31.5–36.0)
MCV: 91.4 fL (ref 79.5–101.0)
MONO#: 0.7 10*3/uL (ref 0.1–0.9)
MONO%: 8.2 % (ref 0.0–14.0)
NEUT%: 53.8 % (ref 38.4–76.8)
NEUTROS ABS: 4.4 10*3/uL (ref 1.5–6.5)
Platelets: 239 10*3/uL (ref 145–400)
RBC: 4.14 10*6/uL (ref 3.70–5.45)
RDW: 13.5 % (ref 11.2–14.5)
WBC: 8.2 10*3/uL (ref 3.9–10.3)
lymph#: 2.1 10*3/uL (ref 0.9–3.3)

## 2016-10-28 LAB — COMPREHENSIVE METABOLIC PANEL
ALT: 9 U/L (ref 0–55)
AST: 14 U/L (ref 5–34)
Albumin: 3.6 g/dL (ref 3.5–5.0)
Alkaline Phosphatase: 90 U/L (ref 40–150)
Anion Gap: 12 mEq/L — ABNORMAL HIGH (ref 3–11)
BUN: 15.4 mg/dL (ref 7.0–26.0)
CO2: 25 meq/L (ref 22–29)
CREATININE: 1.1 mg/dL (ref 0.6–1.1)
Calcium: 10.1 mg/dL (ref 8.4–10.4)
Chloride: 105 mEq/L (ref 98–109)
EGFR: 52 mL/min/{1.73_m2} — ABNORMAL LOW (ref >90)
GLUCOSE: 102 mg/dL (ref 70–140)
Potassium: 3.7 mEq/L (ref 3.5–5.1)
SODIUM: 142 meq/L (ref 136–145)
TOTAL PROTEIN: 6.6 g/dL (ref 6.4–8.3)
Total Bilirubin: 0.34 mg/dL (ref 0.20–1.20)

## 2016-10-28 MED ORDER — SODIUM CHLORIDE 0.9 % IV SOLN
Freq: Once | INTRAVENOUS | Status: AC
  Administered 2016-10-28: 11:00:00 via INTRAVENOUS

## 2016-10-28 MED ORDER — SODIUM CHLORIDE 0.9 % IV SOLN
240.0000 mg | Freq: Once | INTRAVENOUS | Status: AC
  Administered 2016-10-28: 240 mg via INTRAVENOUS
  Filled 2016-10-28: qty 24

## 2016-10-28 NOTE — Progress Notes (Signed)
Brief nutrition visit with patient who is requesting samples of oral nutrition supplements.  Patient reports that she recently was told she has gallstones and has some difficulty eating the supper meal. Provided oral nutrition supplements and brief education on "diet for gallstones."

## 2016-10-28 NOTE — Progress Notes (Signed)
  [place] OFFICE PROGRESS NOTE   DIAGNOSIS: Recurrent non-small cell lung cancer initially diagnosed as a stage IIA adenocarcinoma in December 2012 with no actionable mutations.   PRIOR THERAPY: 1) Status post radiation therapy to the right upper lobe lung mass completed on 06/04/2011 under the care of Dr. Pablo Ledger.  2) Systemic chemotherapy with carboplatin for AUC of 5 and Alimta 500 mg/M2 giving every 3 weeks, status post 3 cycles, last dose was given on 07/21/2011.  3) Systemic chemotherapy with carboplatin for AUC of 5 and Alimta 500 MG/M2 every 3 weeks. First dose on 01/02/2015. Status post 2 cycles, last dose was given 01/30/2015. Her treatment is currently on hold secondary to intolerance. 4) Immunotherapy with Nivolumab 240 M IV every 2 weeks status post 14 cycles.   CURRENT THERAPY: The patient will resume her treatment again with immunotherapy with Nivolumab 240 mg IV every 2 weeks. First dose 09/15/2016. Status post 2 cycles   INTERVAL HISTORY:   [name] returns as scheduled. She completed cycle 3 nivolumab 10/14/2016. She denies nausea/vomiting. No mouth sores. No diarrhea. Energy level is better. No rash. She reports chronic itching at the upper back, present for many years. No change in baseline dyspnea. She continues supplemental oxygen. No fever. She intermittently feels cold.  Objective:  Vital signs in last 24 hours:  Blood pressure 96/68, pulse 78, temperature 98.6 F (37 C), temperature source Oral, resp. rate 17, height 5\' 6"  (1.676 m), weight 170 lb 11.2 oz (77.4 kg), SpO2 100 %, peak flow (!) 4 L/min.    HEENT: No thrush or ulcers. Resp: Lungs clear bilaterally. Cardio: Regular rate and rhythm. GI: Abdomen soft and nontender. No hepatomegaly. Vascular: Trace bilateral lower leg edema.   Lab Results:  Lab Results  Component Value Date   WBC 8.2 10/28/2016   HGB 12.6 10/28/2016   HCT 37.8 10/28/2016   MCV 91.4 10/28/2016   PLT 239  10/28/2016   NEUTROABS 4.4 10/28/2016    Imaging:  No results found.  Medications: I have reviewed the patient's current medications.  Assessment/Plan: 1. Recurrent non-small cell lung cancer, adenocarcinoma that was initially diagnosed as unresectable stage IIA in December 2012 status post systemic chemotherapy as well as immunotherapy. She is currently on treatment with Nivolumab status post 3 cycles of the new regimen.   Disposition: [name] appears stable. She has completed 3 cycles of nivolumab. Plan to proceed with cycle 4 today as scheduled. [name] recommends a restaging chest CT following this cycle. She would like to have this done on 11/10/2016. She is scheduled to return for a follow-up visit 11/11/2016. She will contact the office in the interim with any problems.    Ned Card ANP/GNP-BC   10/28/2016  9:33 AM

## 2016-10-28 NOTE — Patient Instructions (Signed)
[facility] Discharge Instructions for Patients Receiving Chemotherapy  Today you received the following chemotherapy agents:  Nivolumab.  To help prevent nausea and vomiting after your treatment, we encourage you to take your nausea medication as directed.   If you develop nausea and vomiting that is not controlled by your nausea medication, call the clinic.   BELOW ARE SYMPTOMS THAT SHOULD BE REPORTED IMMEDIATELY:  *FEVER GREATER THAN 100.5 F  *CHILLS WITH OR WITHOUT FEVER  NAUSEA AND VOMITING THAT IS NOT CONTROLLED WITH YOUR NAUSEA MEDICATION  *UNUSUAL SHORTNESS OF BREATH  *UNUSUAL BRUISING OR BLEEDING  TENDERNESS IN MOUTH AND THROAT WITH OR WITHOUT PRESENCE OF ULCERS  *URINARY PROBLEMS  *BOWEL PROBLEMS  UNUSUAL RASH Items with * indicate a potential emergency and should be followed up as soon as possible.  Feel free to call the clinic you have any questions or concerns. The clinic phone number is [phone].  Please show the CHEMO ALERT CARD at check-in to the Emergency Department and triage nurse.   

## 2016-10-28 NOTE — Telephone Encounter (Signed)
No LOS on 7/19 - added additional appts per treatment plan - MM patient saw Lattie Haw . Patient aware - Rn to print new schedule in the treatment area.

## 2016-11-01 ENCOUNTER — Telehealth: Payer: Self-pay | Admitting: Medical Oncology

## 2016-11-01 NOTE — Telephone Encounter (Signed)
Pt said someone called her and she was calling back. I tried to return call but no VM.

## 2016-11-02 ENCOUNTER — Encounter: Payer: Self-pay | Admitting: Emergency Medicine

## 2016-11-02 ENCOUNTER — Ambulatory Visit (INDEPENDENT_AMBULATORY_CARE_PROVIDER_SITE_OTHER): Payer: Medicare Other | Admitting: Emergency Medicine

## 2016-11-02 ENCOUNTER — Ambulatory Visit (INDEPENDENT_AMBULATORY_CARE_PROVIDER_SITE_OTHER)
Admission: RE | Admit: 2016-11-02 | Discharge: 2016-11-02 | Disposition: A | Discharge status: Home / Self Care | Payer: Medicare Other | Source: Ambulatory Visit | Attending: Emergency Medicine | Admitting: Emergency Medicine

## 2016-11-02 VITALS — BP 124/70 | HR 116 | Wt 175.0 lb

## 2016-11-02 DIAGNOSIS — R0600 Dyspnea, unspecified: Secondary | ICD-10-CM

## 2016-11-02 DIAGNOSIS — I779 Disorder of arteries and arterioles, unspecified: Secondary | ICD-10-CM

## 2016-11-02 DIAGNOSIS — R0981 Nasal congestion: Secondary | ICD-10-CM

## 2016-11-02 DIAGNOSIS — R0602 Shortness of breath: Secondary | ICD-10-CM | POA: Diagnosis not present

## 2016-11-02 DIAGNOSIS — J449 Chronic obstructive pulmonary disease, unspecified: Secondary | ICD-10-CM | POA: Diagnosis not present

## 2016-11-02 DIAGNOSIS — J9611 Chronic respiratory failure with hypoxia: Secondary | ICD-10-CM | POA: Diagnosis not present

## 2016-11-02 NOTE — Patient Instructions (Signed)
We will perform a chest x-ray today Please continue Combivent 4 times a day Please use albuterol 2 puffs as needed for shortness of breath Please continue your oxygen at 3-4 L/min Please continue Allegra once a day Please increase your Flonase to 2 sprays each nostril every day Get your swallowing evaluation as planned Follow with Cardiology as planned Repeat Ct scan on 11/10/16 as planned and then follow with Dr Julien Nordmann.  Follow with Dr Lamonte Sakai next available.

## 2016-11-02 NOTE — Assessment & Plan Note (Signed)
Progressive dyspnea since our last visit. Many possible etiologies and likely multifactorial. Certainly consider progression of her malignancy or evolution of a pleural effusion. Could also reflect a reaction to her immunotherapy. She has severe COPD and he could be progression of this or also coronary disease which has not been evaluated for several years. Her overall functional decline is concerning and she may be getting to the point where she'll have to stop her cancer therapy and start transition towards a more palliative approach. Hopefully there will be something straightforward that can be addressed such as a possible pleural effusion.  We will perform a chest x-ray today Please continue Combivent 4 times a day Please use albuterol 2 puffs as needed for shortness of breath Please continue your oxygen at 3-4 L/min Get your swallowing evaluation as planned Follow with Cardiology as planned Repeat Ct scan on 11/10/16 as planned and then follow with Dr Julien Nordmann.  Follow with Dr Lamonte Sakai next available.

## 2016-11-02 NOTE — Assessment & Plan Note (Signed)
Continue Spiriva and Symbicort as ordered 

## 2016-11-02 NOTE — Progress Notes (Signed)
Subjective:    Patient ID: [name], female    DOB: [birth year], 71 y.o.   MRN: [id number] HPI History of Present Illness:   ROV 02/09/16 -- patient has a history of COPD as well as stage IIIA adenocarcinoma the right upper lobe. She has been managed on Nivolumab. She underwent an MRI brain 01/23/16 that did not show any evidence of metastatic disease.  We tried changing her to [facility], took it for 2 weeks but had more chest tightness, more dyspnea. Went back to [place], currently using 2 puffs bid. She is having some exertional SOB, difficulty with any activity, showering. Wears O2 at 4L/min usually. She has a lot of cough, produces sputum. She is on loratadine alternated w zyrtec-d, and fluticasone qd.   ROV 06/09/16 -- Patient has a history of stage IIIa adenocarcinoma of the right upper lobe and severe COPD. She has been treated with right upper lobe radiation therapy, chemotherapy in 2013 and then again in 2016, then with immunotherapy (Nivolumab), stopped . Most recent CT scan of the chest was done on 06/02/16 that I have personally reviewed. This shows an increase in size in her mid lung groundglass lesion associated with some scattered satellite groundglass nodules. She has been treated recently with doxycycline and then with augmentin, still taking it. Not sure that it has helped her. She has had nasal gtt, cough for the last 3-4 weeks. Sputum has been dark at times.  She is on combivent 4x a day. She is not currently on loratadine, she uses flonase daily. She is using mucinex DM qd.   ROV 08/05/16 -- This is a follow-up visit for severe COPD. 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 She has an enlarging right upper lobe groundglass area and has been in consultation with Dr. [name] regarding timing of reinitiation of her immunotherapy. She is currently managed on combivent, flonase. She reports that she had increase dyspnea last week, some increased  wheeze. We treated her with a course of prednisone, finished it last Saturday. She has rebounded some this week. She is taking zyrtec, flonase nasal spray.   ROV 11/02/16 -- [name] has a history of severe COPD, stage IIIa adenocarcinoma the right upper lobe. She had chemoradiation and then was treated with immunotherapy. This was stopped for a period of time in the setting of worsening symptoms felt to be either bronchitic/pneumonia and bronchospasm. Her Nivolumab was restarted in June. She tells me that she is experiencing progressive functional decline, more dyspnea. She feels that she is "smothering". She is using combivent qid, albuterol about 2x a day. Her O2 is at 3L/min. She is due for a repeat Ct chest on 11/10/16. She does not have much cough. She does have wheezing. She is having a lot of nasal drainage. She changed loratadine to allegra. She uses flonase prn.       Objective:   Physical Exam Vitals:   11/02/16 1543  BP: 124/70  Pulse: (!) 116  SpO2: 98%  Weight: 175 lb (79.4 kg)   Gen: Pleasant, elderly woman, in no distress,  normal affect  ENT: No lesions,  mouth clear,  oropharynx clear, no postnasal drip  Neck: No JVD, no TMG, no carotid bruits  Lungs: No use of accessory muscles, distant, no wheeze  Cardiovascular: RRR, heart sounds normal, no murmur or gallops, no peripheral edema  Musculoskeletal: No deformities, no cyanosis or clubbing.   Neuro: alert, non focal  Skin: Warm, no  lesions or rashes   CBC Latest Ref Rng & Units 10/28/2016 10/14/2016 09/30/2016  WBC 3.9 - 10.3 10e3/uL 8.2 8.8 9.5  Hemoglobin 11.6 - 15.9 g/dL 12.6 11.3(L) 12.6  Hematocrit 34.8 - 46.6 % 37.8 35.0 37.6  Platelets 145 - 400 10e3/uL 239 209 226   BMET    Component Value Date/Time   NA 142 10/28/2016 0905   K 3.7 10/28/2016 0905   CL 95 (L) 03/08/2016 1717   CL 99 07/21/2012 1125   CO2 25 10/28/2016 0905   GLUCOSE 102 10/28/2016 0905   GLUCOSE 184 (H) 07/21/2012 1125   BUN 15.4  10/28/2016 0905   CREATININE 1.1 10/28/2016 0905   CALCIUM 10.1 10/28/2016 0905   GFRNONAA 42 (L) 08/06/2015 1036   GFRAA 48 (L) 08/06/2015 1036        Assessment & Plan:  SOB (shortness of breath) Progressive dyspnea since our last visit. Many possible etiologies and likely multifactorial. Certainly consider progression of her malignancy or evolution of a pleural effusion. Could also reflect a reaction to her immunotherapy. She has severe COPD and he could be progression of this or also coronary disease which has not been evaluated for several years. Her overall functional decline is concerning and she may be getting to the point where she'll have to stop her cancer therapy and start transition towards a more palliative approach. Hopefully there will be something straightforward that can be addressed such as a possible pleural effusion.  We will perform a chest x-ray today Please continue Combivent 4 times a day Please use albuterol 2 puffs as needed for shortness of breath Please continue your oxygen at 3-4 L/min Get your swallowing evaluation as planned Follow with Cardiology as planned Repeat Ct scan on 11/10/16 as planned and then follow with Dr Julien Nordmann.  Follow with Dr Lamonte Sakai next available.  COPD (chronic obstructive pulmonary disease) (HCC) Continue Spiriva and Symbicort as ordered  Respiratory failure with hypoxia 3-4 L/m  Baltazar Apo, MD, PhD 11/02/2016, 4:06 PM [facility] [phone] or if no answer [phone]

## 2016-11-02 NOTE — Assessment & Plan Note (Signed)
3-4 L/m

## 2016-11-02 NOTE — Addendum Note (Signed)
Addended by: Desmond Dike C on: 11/02/2016 04:40 PM   Modules accepted: Orders

## 2016-11-03 ENCOUNTER — Telehealth: Payer: Self-pay | Admitting: Emergency Medicine

## 2016-11-03 DIAGNOSIS — R0602 Shortness of breath: Secondary | ICD-10-CM

## 2016-11-03 NOTE — Telephone Encounter (Signed)
Called and spoke to pt. Pt states in order to see her cardiologist she will need a referral. Also, pt is requesting the results of her CXR.   [name] please advise if ok to place the referral order and what her CXR results are.

## 2016-11-04 ENCOUNTER — Telehealth: Payer: Self-pay

## 2016-11-04 NOTE — Telephone Encounter (Signed)
Spoke with patient and she will cancel her appt with Dr. Eulas Post tomorrow and see Dr. Mariea Clonts on 11/15/16. Pt states she is already seeing a ENT near our office ( she couldn't remember the name) pt states she sick and has lung cancer and COPD and have enough dr's. appt canceled

## 2016-11-04 NOTE — Telephone Encounter (Signed)
Please see phone note from Dr. Ulla Potash office dated 11/05/16.

## 2016-11-04 NOTE — Telephone Encounter (Signed)
It sounds like [name] made some recommendations to increase her flonase and continue with her allergy medication to address the ear fullness.  He had offered an ENT referral, too.  I recommend she keep the appt tomorrow so that her ear can be checked by Dr. Eulas Post to determine if ENT referral is needed or any other treatments.

## 2016-11-04 NOTE — Telephone Encounter (Signed)
Per recorded message " VM box not set up yet" will try again later.

## 2016-11-04 NOTE — Telephone Encounter (Signed)
Spoke with pt and made her aware of RB's message. She states she wants to hold off on the ENT referral and that she will further discuss first with PCP. Pt still wanted referral to Dr. Burt Knack and this has been placed. Pt had no additional questions at this time. Nothing further is needed

## 2016-11-04 NOTE — Telephone Encounter (Signed)
Message left on clinical intake voicemail:   Patient has an appointment with Dr.Carter tomorrow for a follow-up, patient states "Im not sure why this appointment was scheduled with Dr.Carter for Dr.Reed is my doctor." Patient has a bad right ear ache that can not wait until tomorrow.  Patient is requesting treatment today, patient would like to make sure that whatever is prescribed that it will not  with interact with her chemo meds  Please advise

## 2016-11-04 NOTE — Telephone Encounter (Signed)
Please let her know that I do not see any increase in her pleural fluid - nothing for [name] to improve with a thoracentesis. It is OK to refer her back to see Dr Burt Knack. I asked her to use her allergy meds and decongestants to help relieve her ear pressure. We could refer her to ENT if she would like for [name] to- ask her if she would like

## 2016-11-04 NOTE — Telephone Encounter (Signed)
Patient called regarding cxr results - pt also mentioned she is having ear pain and drainage -advised I will add to the message but she may want to call pcp to have this addressed - Patient can be reached at [phone] -pr

## 2016-11-05 ENCOUNTER — Ambulatory Visit: Payer: Self-pay | Admitting: Internal Medicine

## 2016-11-08 ENCOUNTER — Ambulatory Visit (INDEPENDENT_AMBULATORY_CARE_PROVIDER_SITE_OTHER)
Admission: RE | Admit: 2016-11-08 | Discharge: 2016-11-08 | Disposition: A | Discharge status: Home / Self Care | Payer: Medicare Other | Source: Ambulatory Visit | Attending: Emergency Medicine | Admitting: Emergency Medicine

## 2016-11-08 DIAGNOSIS — R0981 Nasal congestion: Secondary | ICD-10-CM

## 2016-11-08 DIAGNOSIS — J3489 Other specified disorders of nose and nasal sinuses: Secondary | ICD-10-CM | POA: Diagnosis not present

## 2016-11-09 ENCOUNTER — Other Ambulatory Visit: Payer: Self-pay | Admitting: *Deleted

## 2016-11-09 DIAGNOSIS — G44229 Chronic tension-type headache, not intractable: Secondary | ICD-10-CM

## 2016-11-09 DIAGNOSIS — G8929 Other chronic pain: Secondary | ICD-10-CM

## 2016-11-09 DIAGNOSIS — M5136 Other intervertebral disc degeneration, lumbar region: Secondary | ICD-10-CM

## 2016-11-09 DIAGNOSIS — M25552 Pain in left hip: Secondary | ICD-10-CM

## 2016-11-09 MED ORDER — HYDROCODONE-ACETAMINOPHEN 10-325 MG PO TABS: ORAL_TABLET | ORAL | 0 refills | Status: DC

## 2016-11-09 NOTE — Telephone Encounter (Signed)
Patient requested and Horris Latino will pick up

## 2016-11-10 ENCOUNTER — Ambulatory Visit (HOSPITAL_COMMUNITY)
Admission: RE | Admit: 2016-11-10 | Discharge: 2016-11-10 | Disposition: A | Discharge status: Home / Self Care | Payer: Medicare Other | Source: Ambulatory Visit | Attending: Nurse Practitioner | Admitting: Nurse Practitioner

## 2016-11-10 ENCOUNTER — Encounter (HOSPITAL_COMMUNITY): Payer: Self-pay

## 2016-11-10 DIAGNOSIS — R1312 Dysphagia, oropharyngeal phase: Secondary | ICD-10-CM | POA: Insufficient documentation

## 2016-11-10 DIAGNOSIS — I700 Atherosclerosis of aorta: Secondary | ICD-10-CM | POA: Diagnosis not present

## 2016-11-10 DIAGNOSIS — J449 Chronic obstructive pulmonary disease, unspecified: Secondary | ICD-10-CM | POA: Diagnosis not present

## 2016-11-10 DIAGNOSIS — K219 Gastro-esophageal reflux disease without esophagitis: Secondary | ICD-10-CM | POA: Diagnosis not present

## 2016-11-10 DIAGNOSIS — I509 Heart failure, unspecified: Secondary | ICD-10-CM | POA: Diagnosis not present

## 2016-11-10 DIAGNOSIS — Z85118 Personal history of other malignant neoplasm of bronchus and lung: Secondary | ICD-10-CM | POA: Diagnosis not present

## 2016-11-10 DIAGNOSIS — R1310 Dysphagia, unspecified: Secondary | ICD-10-CM

## 2016-11-10 DIAGNOSIS — I2510 Atherosclerotic heart disease of native coronary artery without angina pectoris: Secondary | ICD-10-CM | POA: Insufficient documentation

## 2016-11-10 DIAGNOSIS — C3411 Malignant neoplasm of upper lobe, right bronchus or lung: Secondary | ICD-10-CM | POA: Diagnosis not present

## 2016-11-10 DIAGNOSIS — J9809 Other diseases of bronchus, not elsewhere classified: Secondary | ICD-10-CM | POA: Diagnosis not present

## 2016-11-10 DIAGNOSIS — J439 Emphysema, unspecified: Secondary | ICD-10-CM | POA: Diagnosis not present

## 2016-11-11 ENCOUNTER — Encounter: Payer: Self-pay | Admitting: Internal Medicine

## 2016-11-11 ENCOUNTER — Other Ambulatory Visit (HOSPITAL_BASED_OUTPATIENT_CLINIC_OR_DEPARTMENT_OTHER): Payer: Medicare Other

## 2016-11-11 ENCOUNTER — Ambulatory Visit (HOSPITAL_BASED_OUTPATIENT_CLINIC_OR_DEPARTMENT_OTHER): Payer: Medicare Other | Admitting: Internal Medicine

## 2016-11-11 ENCOUNTER — Ambulatory Visit (HOSPITAL_BASED_OUTPATIENT_CLINIC_OR_DEPARTMENT_OTHER): Payer: Medicare Other

## 2016-11-11 ENCOUNTER — Other Ambulatory Visit: Payer: Self-pay | Admitting: Medical Oncology

## 2016-11-11 VITALS — BP 118/40 | HR 95 | Temp 98.6°F | Resp 18 | Ht 66.0 in | Wt 172.1 lb

## 2016-11-11 DIAGNOSIS — Z5112 Encounter for antineoplastic immunotherapy: Secondary | ICD-10-CM

## 2016-11-11 DIAGNOSIS — C3411 Malignant neoplasm of upper lobe, right bronchus or lung: Secondary | ICD-10-CM

## 2016-11-11 DIAGNOSIS — J449 Chronic obstructive pulmonary disease, unspecified: Secondary | ICD-10-CM

## 2016-11-11 DIAGNOSIS — Z79899 Other long term (current) drug therapy: Secondary | ICD-10-CM | POA: Diagnosis not present

## 2016-11-11 DIAGNOSIS — R269 Unspecified abnormalities of gait and mobility: Secondary | ICD-10-CM

## 2016-11-11 DIAGNOSIS — I10 Essential (primary) hypertension: Secondary | ICD-10-CM

## 2016-11-11 DIAGNOSIS — R5382 Chronic fatigue, unspecified: Secondary | ICD-10-CM

## 2016-11-11 DIAGNOSIS — R531 Weakness: Secondary | ICD-10-CM

## 2016-11-11 DIAGNOSIS — R0602 Shortness of breath: Secondary | ICD-10-CM

## 2016-11-11 LAB — COMPREHENSIVE METABOLIC PANEL
ALT: 7 U/L (ref 0–55)
ANION GAP: 9 meq/L (ref 3–11)
AST: 15 U/L (ref 5–34)
Albumin: 3.6 g/dL (ref 3.5–5.0)
Alkaline Phosphatase: 102 U/L (ref 40–150)
BILIRUBIN TOTAL: 0.26 mg/dL (ref 0.20–1.20)
BUN: 13.3 mg/dL (ref 7.0–26.0)
CALCIUM: 9.5 mg/dL (ref 8.4–10.4)
CO2: 28 meq/L (ref 22–29)
CREATININE: 1 mg/dL (ref 0.6–1.1)
Chloride: 105 mEq/L (ref 98–109)
EGFR: 58 mL/min/{1.73_m2} — ABNORMAL LOW (ref >90)
Glucose: 87 mg/dl (ref 70–140)
Potassium: 4.1 mEq/L (ref 3.5–5.1)
Sodium: 142 mEq/L (ref 136–145)
TOTAL PROTEIN: 6.3 g/dL — AB (ref 6.4–8.3)

## 2016-11-11 LAB — CBC WITH DIFFERENTIAL/PLATELET
BASO%: 0.5 % (ref 0.0–2.0)
Basophils Absolute: 0 10*3/uL (ref 0.0–0.1)
EOS%: 12.2 % — AB (ref 0.0–7.0)
Eosinophils Absolute: 0.9 10*3/uL — ABNORMAL HIGH (ref 0.0–0.5)
HEMATOCRIT: 34.6 % — AB (ref 34.8–46.6)
HGB: 11.1 g/dL — ABNORMAL LOW (ref 11.6–15.9)
LYMPH#: 1.7 10*3/uL (ref 0.9–3.3)
LYMPH%: 22.5 % (ref 14.0–49.7)
MCH: 30.2 pg (ref 25.1–34.0)
MCHC: 32.1 g/dL (ref 31.5–36.0)
MCV: 94 fL (ref 79.5–101.0)
MONO#: 0.6 10*3/uL (ref 0.1–0.9)
MONO%: 8.3 % (ref 0.0–14.0)
NEUT%: 56.5 % (ref 38.4–76.8)
NEUTROS ABS: 4.3 10*3/uL (ref 1.5–6.5)
PLATELETS: 226 10*3/uL (ref 145–400)
RBC: 3.68 10*6/uL — ABNORMAL LOW (ref 3.70–5.45)
RDW: 12.7 % (ref 11.2–14.5)
WBC: 7.7 10*3/uL (ref 3.9–10.3)

## 2016-11-11 LAB — TSH: TSH: 1.001 m(IU)/L (ref 0.308–3.960)

## 2016-11-11 MED ORDER — SODIUM CHLORIDE 0.9 % IV SOLN
Freq: Once | INTRAVENOUS | Status: AC
  Administered 2016-11-11: 13:00:00 via INTRAVENOUS

## 2016-11-11 MED ORDER — SODIUM CHLORIDE 0.9 % IV SOLN
240.0000 mg | Freq: Once | INTRAVENOUS | Status: AC
  Administered 2016-11-11: 240 mg via INTRAVENOUS
  Filled 2016-11-11: qty 24

## 2016-11-11 NOTE — Patient Instructions (Signed)
[facility] Discharge Instructions for Patients Receiving Chemotherapy  Today you received the following chemotherapy agents nivolumab   To help prevent nausea and vomiting after your treatment, we encourage you to take your nausea medication as directed   If you develop nausea and vomiting that is not controlled by your nausea medication, call the clinic.   BELOW ARE SYMPTOMS THAT SHOULD BE REPORTED IMMEDIATELY:  *FEVER GREATER THAN 100.5 F  *CHILLS WITH OR WITHOUT FEVER  NAUSEA AND VOMITING THAT IS NOT CONTROLLED WITH YOUR NAUSEA MEDICATION  *UNUSUAL SHORTNESS OF BREATH  *UNUSUAL BRUISING OR BLEEDING  TENDERNESS IN MOUTH AND THROAT WITH OR WITHOUT PRESENCE OF ULCERS  *URINARY PROBLEMS  *BOWEL PROBLEMS  UNUSUAL RASH Items with * indicate a potential emergency and should be followed up as soon as possible.  Feel free to call the clinic you have any questions or concerns. The clinic phone number is [phone].  

## 2016-11-11 NOTE — Progress Notes (Signed)
[place] [phone]   [phone]  OFFICE PROGRESS NOTE  [place], [place] [address]  DIAGNOSIS: Recurrent non-small cell lung cancer initially diagnosed as a stage IIA adenocarcinoma in December 2012 with no actionable mutations.   PRIOR THERAPY: 1) Status post radiation therapy to the right upper lobe lung mass completed on 06/04/2011 under the care of Dr. Pablo Ledger.  2) Systemic chemotherapy with carboplatin for AUC of 5 and Alimta 500 mg/M2 giving every 3 weeks, status post 3 cycles, last dose was given on 07/21/2011.  3) Systemic chemotherapy with carboplatin for AUC of 5 and Alimta 500 MG/M2 every 3 weeks. First dose on 01/02/2015. Status post 2 cycles, last dose was given 01/30/2015. Her treatment is currently on hold secondary to intolerance. 4) Immunotherapy with Nivolumab 240 M IV every 2 weeks status post 14 cycles.   CURRENT THERAPY: The patient will resume her treatment again with immunotherapy with Nivolumab 240 mg IV every 2 weeks. First dose 09/15/2016. Status post 4 cycles  INTERVAL HISTORY: [name] 70 y.o. female returns to the clinic for follow-up visit accompanied by her son. The patient is feeling fine today with no specific complaints except for the baseline shortness of breath and she is currently on home oxygen. She denied having any chest pain but has mild cough with no hemoptysis. She denied having any recent weight loss or night sweats. She has no nausea, vomiting, diarrhea or constipation. She has no significant fever or chills. She has been tolerating her treatment with Nivolumab fairly well. The patient had repeat CT scan of the chest performed recently and she is here for evaluation and discussion of her scan results.  MEDICAL HISTORY: Past Medical History:  Diagnosis Date  . Acute posthemorrhagic anemia   . Acute sinusitis, unspecified   . Acute upper respiratory infections of  unspecified site   . Adenocarcinoma, lung ([facility])    upper left lobe  . Anemia in neoplastic disease   . Anemia, unspecified   . Anginal pain ([place])   . Anxiety   . Anxiety state, unspecified   . Arthritis    "all over my body"  . Asthma   . Asymptomatic varicose veins   . Atherosclerosis of native arteries of the extremities, unspecified   . Atrial fibrillation ([place])   . Bronchial pneumonia    history  . 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 Chemotherapy adverse reaction 09/15/11   "makes me light headed and pass out; this is 3rd blood transfusion since going thru it"  . CHF (congestive heart failure) ([place])   . Chronic airway obstruction, not elsewhere classified   . Chronic fatigue 08/19/2015  . Chronic sinusitis   . Complication of anesthesia    slow to wake up  . COPD (chronic obstructive pulmonary disease) ([place])   . Coronary artery disease    Dr. Burt Knack had stress test done  . DDD (degenerative disc disease), lumbar   . Depressive disorder, not elsewhere classified   . Diseases of lips   . Disorder of bone and cartilage, unspecified   . Dizziness and giddiness   . DVT (deep venous thrombosis) ([facility])   . Dysrhythmia    irregular  . Effects of radiation, unspecified   . Emphysema   . Encounter for antineoplastic immunotherapy 08/19/2015  . Fibromyalgia   . Functional urinary incontinence   . GERD (gastroesophageal reflux disease)   .  Hard of hearing, right   . Heart murmur   . Herpes zoster without mention of complication   . Hip pain, chronic 09/17/2014  . History of blood clots    "2 in my aorta"  . History of blood transfusion   . History of bronchitis   . Hx of radiation therapy 04/28/11 -06/08/11   right upper lobe-lung  . Hyperlipidemia   . Hyperpotassemia   . Hypertension   . Hypopotassemia   . Kidney infection    "related to chemo"  . Lumbago   . Malignant neoplasm of bronchus and lung, unspecified site   . Mental disorder   . Muscle weakness  (generalized)   . Muscle weakness (generalized)   . Non-small cell lung cancer ([facility]) 03/31/2011   Adenocarcinoma Rt upper lobe mass  . Osteoarthrosis, unspecified whether generalized or localized, unspecified site   . Osteoporosis   . Other abnormal blood chemistry   . Other and unspecified hyperlipidemia   . PAD (peripheral artery disease) ([facility])   . Pain in thoracic spine   . Peripheral vascular disease ([place])   . 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 Primary cancer of right upper lobe of lung ([facility]) 02/08/2011   Biopsy Rt upper lobe mass 03/31/11>>adenocarcinoma DIAGNOSIS: Stage IIB/IIIA non-small cell lung cancer consistent with adenocarcinoma diagnosed in December 2012.   PRIOR THERAPY:  1) Status post radiation therapy to the right upper lobe lung mass completed on 06/04/2011 under the care of Dr. Pablo Ledger.  2) Systemic chemotherapy with carboplatin for AUC of 5 and Alimta 500 mg/M2 giving every 3   . Reflux   . Reflux esophagitis   . Respiratory failure with hypoxia 01/02/2010  . Restless legs syndrome (RLS)   . Scoliosis   . Screening for thyroid disorder   . Shortness of breath    "anytime really"  . Sinus headache    "all the time"  . Spasm of muscle   . Syncope and collapse   . Type II or unspecified type diabetes mellitus with neurological manifestations, uncontrolled(250.62)   . Type II or unspecified type diabetes mellitus without mention of complication, not stated as uncontrolled   . Type II or unspecified type diabetes mellitus without mention of complication, not stated as uncontrolled   . Unspecified constipation   . Unspecified essential hypertension   . Unspecified hearing loss   . Unspecified hereditary and idiopathic peripheral neuropathy   . Unspecified vitamin D deficiency   . Urinary tract infection, site not specified     ALLERGIES:  is allergic to iohexol; other; anoro ellipta [umeclidinium-vilanterol]; codeine; and protonix [pantoprazole  sodium].  MEDICATIONS:  Current Outpatient Prescriptions  Medication Sig Dispense Refill  . ACCU-CHEK SOFTCLIX LANCETS lancets by Other route. Check blood sugar twice daily. Dx: E11.9, E11.49    . albuterol (PROVENTIL HFA;VENTOLIN HFA) 108 (90 Base) MCG/ACT inhaler Inhale 2 puffs into the lungs every 6 (six) hours as needed for wheezing or shortness of breath. 1 Inhaler 0  . albuterol (PROVENTIL) (2.5 MG/3ML) 0.083% nebulizer solution Take 3 mLs (2.5 mg total) by nebulization every 6 (six) hours as needed for wheezing or shortness of breath. 75 mL 5  . ALPRAZolam (XANAX) 1 MG tablet TAKE 1 TABLET BY MOUTH THREE TIMES DAILY AS NEEDED FOR ANXIETY 90 tablet 0  . aspirin 81 MG tablet Take 81 mg by mouth daily.    . Blood Glucose Monitoring Suppl (ACCU-CHEK AVIVA PLUS) w/Device KIT Use as directed to check blood sugar Dx: E11.9,  E11.49 1 kit 0  . Ca Phosphate-Cholecalciferol (CALTRATE GUMMY BITES) 250-400 MG-UNIT CHEW Chew 1 each by mouth daily.    . Cetirizine HCl 10 MG CAPS Take 1 capsule by mouth daily.     Marland Kitchen diltiazem (MATZIM LA) 240 MG 24 hr tablet Take 240 mg by mouth daily.    . fluticasone (FLONASE) 50 MCG/ACT nasal spray INSTILL 2 SPRAYS IN EACH NOSTRIL TWICE DAILY *NEEDS APPOINTMENT* 16 g 5  . HYDROcodone-acetaminophen (NORCO) 10-325 MG tablet Take One tablet by mouth every 6 hours if needed for pain 120 tablet 0  . Ipratropium-Albuterol (COMBIVENT RESPIMAT) 20-100 MCG/ACT AERS respimat Inhale 2 puffs into the lungs 4 (four) times daily. 2 Inhaler 5  . metFORMIN (GLUCOPHAGE) 500 MG tablet Take 1 tablet (500 mg total) by mouth daily as needed (high blood sugar- 160+). Take one tablet at night for blood sugar 60 tablet 5  . Multiple Vitamins-Minerals (CENTRUM SILVER PO) Take 1 tablet by mouth daily. Patient unsure of dose    . nystatin (MYCOSTATIN) 100000 UNIT/ML suspension Use as directed 5 mLs (500,000 Units total) in the mouth or throat 4 (four) times daily. 60 mL 0  . potassium chloride  (K-DUR,KLOR-CON) 10 MEQ tablet Take 1 tablet (10 mEq total) by mouth daily. 30 tablet 0  . traMADol (ULTRAM) 50 MG tablet Take 2 tablets (100 mg total) by mouth every 6 (six) hours as needed for severe pain. 90 tablet 1  . triamterene-hydrochlorothiazide (MAXZIDE-25) 37.5-25 MG tablet TAKE 1 TABLET BY MOUTH DAILY 90 tablet 1   No current facility-administered medications for this visit.    Facility-Administered Medications Ordered in Other Visits  Medication Dose Route Frequency Provider Last Rate Last Dose  . hyaluronate sodium (RADIAPLEXRX) gel   Topical Once Thea Silversmith, MD      . topical emolient (BIAFINE) emulsion   Topical PRN Thea Silversmith, MD        SURGICAL HISTORY:  Past Surgical History:  Procedure Laterality Date  . BRONCHOSCOPY  02/2011  . CARDIAC CATHETERIZATION    . CATARACT EXTRACTION  03/12/2013   Left Eye   . DILATION AND CURETTAGE OF UTERUS  1980's  . EYE SURGERY Left 2014   Cataract  . FETAL BLOOD TRANSFUSION  09/16/11  . JOINT REPLACEMENT  04/13/10   Left Hip  . LUNG LOBECTOMY  1986   left  . SEPTOPLASTY    . TOTAL HIP ARTHROPLASTY  01/2010   left  . TUBAL LIGATION  1980's    REVIEW OF SYSTEMS:  Constitutional: positive for fatigue Eyes: negative Ears, nose, mouth, throat, and face: negative Respiratory: positive for cough and dyspnea on exertion Cardiovascular: negative Gastrointestinal: negative Genitourinary:negative Integument/breast: negative Hematologic/lymphatic: negative Musculoskeletal:positive for arthralgias Neurological: negative Behavioral/Psych: negative Endocrine: negative Allergic/Immunologic: negative   PHYSICAL EXAMINATION: General appearance: alert, cooperative, fatigued and mild distress Head: Normocephalic, without obvious abnormality, atraumatic Neck: no adenopathy, no JVD, supple, symmetrical, trachea midline and thyroid not enlarged, symmetric, no tenderness/mass/nodules Lymph nodes: Cervical, supraclavicular, and  axillary nodes normal. Resp: wheezes bilaterally Back: symmetric, no curvature. ROM normal. No CVA tenderness. Cardio: regular rate and rhythm, S1, S2 normal, no murmur, click, rub or gallop GI: soft, non-tender; bowel sounds normal; no masses,  no organomegaly Extremities: extremities normal, atraumatic, no cyanosis or edema Neurologic: Alert and oriented X 3, normal strength and tone. Normal symmetric reflexes. Normal coordination and gait  ECOG PERFORMANCE STATUS: 1 - Symptomatic but completely ambulatory  Blood pressure (!) 118/40, pulse 95, temperature 98.6 F (37  C), temperature source Oral, resp. rate 18, height 5' 6"  (1.676 m), weight 172 lb 1.6 oz (78.1 kg), SpO2 99 %.  LABORATORY DATA: Lab Results  Component Value Date   WBC 7.7 11/11/2016   HGB 11.1 (L) 11/11/2016   HCT 34.6 (L) 11/11/2016   MCV 94.0 11/11/2016   PLT 226 11/11/2016      Chemistry      Component Value Date/Time   NA 142 10/28/2016 0905   K 3.7 10/28/2016 0905   CL 95 (L) 03/08/2016 1717   CL 99 07/21/2012 1125   CO2 25 10/28/2016 0905   BUN 15.4 10/28/2016 0905   CREATININE 1.1 10/28/2016 0905      Component Value Date/Time   CALCIUM 10.1 10/28/2016 0905   ALKPHOS 90 10/28/2016 0905   AST 14 10/28/2016 0905   ALT 9 10/28/2016 0905   BILITOT 0.34 10/28/2016 0905       RADIOGRAPHIC STUDIES: Dg Chest 2 View  Result Date: 11/02/2016 CLINICAL DATA:  Shortness of breath. EXAM: CHEST  2 VIEW COMPARISON:  CT 08/31/2016 .  Chest x-ray 03/03/2016 . FINDINGS: Surgical clips and sutures noted over the left chest consistent with left upper lobectomy. Stable left pleuroparenchymal thickening consistent with scarring noted. Previously identified right upper lobe process is unchanged and may be related to scarring. Continued follow-up exams can be obtained. Heart size normal. No pleural effusion or pneumothorax. Degenerative changes scoliosis thoracic spine. IMPRESSION: 1. Surgical clips sutures noted over  the left chest consist with left upper lobectomy. Stable pleural-parenchymal thickening noted consistent with scarring. 2. Previous identified right upper lobe process is unchanged and may be related scarring. Continued follow-up chest x-rays can be obtained . Electronically Signed   By: Marcello Moores  Register   On: 11/02/2016 17:27   Ct Chest Wo Contrast  Result Date: 11/11/2016 CLINICAL DATA:  71 year old female with history of left-sided lung cancer diagnosed in 1986 status post left upper lobectomy. Right-sided lung cancer than diagnosed in 2012 treated with chemotherapy and radiation therapy. Undergoing ongoing infusions. EXAM: CT CHEST WITHOUT CONTRAST TECHNIQUE: Multidetector CT imaging of the chest was performed following the standard protocol without IV contrast. COMPARISON:  Multiple priors, most recently chest CT 08/31/2016. FINDINGS: Cardiovascular: Heart size is normal. Small amount of pericardial fluid and/or thickening, similar to the prior study, and unlikely to be of any hemodynamic significance at this time. No associated pericardial calcification. There is aortic atherosclerosis, as well as atherosclerosis of the great vessels of the mediastinum and the coronary arteries, including calcified atherosclerotic plaque in the left anterior descending and left circumflex coronary arteries. Mediastinum/Nodes: No pathologically enlarged mediastinal or hilar lymph nodes. Please note that accurate exclusion of hilar adenopathy is limited on noncontrast CT scans. Large amount of oral contrast material noted in the esophagus related to recent swallow study, compatible with gastroesophageal reflux. Esophagus is otherwise unremarkable in appearance. No axillary lymphadenopathy. Lungs/Pleura: Moderate right pleural effusion lying dependently, similar to the prior study. Complete chronic collapse of the right upper lobe again noted. Chronic scarring and volume loss in the perihilar aspect of the right lower lobe,  particularly involving the superior segment. Persistent mass-like area of ground-glass attenuation, septal thickening and some peripheral predominant more confluent appearing areas of consolidation in the right middle lobe best appreciated on axial image 68 of series 5, measuring 8.7 x 5.7 cm on today's study. This is more prominent than recent prior examinations, and has progressively worsened over the past several years, previously appearing only as a  vague area of ground-glass attenuation on 07/21/2012. Although this could represent evolving postradiation changes, the possibility of a slow-growing neoplasm such as an adenocarcinoma warrants consideration. A few other patchy more ill-defined areas of ground-glass attenuation are noted throughout both lungs, nonspecific. Status post left upper lobectomy. Trace left pleural effusion lying dependently. Diffuse bronchial wall thickening with mild to moderate centrilobular and paraseptal emphysema. Upper Abdomen: Calcified gallstones lying dependently in the gallbladder. No findings to suggest an acute cholecystitis at this time. Musculoskeletal: Chronic compression fractures at T4 and T5 with post vertebroplasty changes at T4 again noted, most severe at T4 where there is approximately 30% loss of central vertebral body height. There are no aggressive appearing lytic or blastic lesions noted in the visualized portions of the skeleton. IMPRESSION: 1. The majority of today's examination is stable compared to prior studies, although there continues to be slow progressive enlargement of a mass-like area of predominantly ground-glass attenuation and septal thickening in the right middle lobe. Given the slow progression compared to prior studies dating back to 2014, the possibility of slow-growing neoplasm such as a primary bronchogenic adenocarcinoma should be considered. Further evaluation with PET-CT is strongly recommended in the near future to better evaluate this  finding and identify potential areas of hypermetabolism which may serve as a target for future biopsy. 2. Mild diffuse bronchial wall thickening with mild to moderate centrilobular and paraseptal emphysema; imaging findings suggestive of underlying COPD. 3. Aortic atherosclerosis, in addition to 2 vessel coronary artery disease. Assessment for potential risk factor modification, dietary therapy or pharmacologic therapy may be warranted, if clinically indicated. 4. Oral contrast material in the esophagus from recent swallow study, indicative of gastroesophageal reflux. Aortic Atherosclerosis (ICD10-I70.0) and Emphysema (ICD10-J43.9). Electronically Signed   By: Vinnie Langton M.D.   On: 11/11/2016 09:27   Dg Op Swallowing Func-medicare/speech Path  Result Date: 11/10/2016 Objective Swallowing Evaluation: Type of Study: MBS-Modified Barium Swallow Study Patient Details Name: [name] MRN: [id number] Date of Birth: [birth year] Today's Date: 11/10/2016 Time: SLP Start Time (ACUTE ONLY): 1310-SLP Stop Time (ACUTE ONLY): 1345 SLP Time Calculation (min) (ACUTE ONLY): 35 min Past Medical History: Past Medical History: Diagnosis Date . Acute posthemorrhagic anemia  . Acute sinusitis, unspecified  . Acute upper respiratory infections of unspecified site  . Adenocarcinoma, lung ([facility])   upper left lobe . Anemia in neoplastic disease  . Anemia, unspecified  . Anginal pain ([facility])  . Anxiety  . Anxiety state, unspecified  . Arthritis   "all over my body" . Asthma  . Asymptomatic varicose veins  . Atherosclerosis of native arteries of the extremities, unspecified  . Atrial fibrillation ([facility])  . Bronchial pneumonia   history . 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 Chemotherapy adverse reaction 09/15/11  "makes me light headed and pass out; this is 3rd blood transfusion since going thru it" . CHF (congestive heart failure) ([facility])  . Chronic airway obstruction, not elsewhere classified  . Chronic fatigue 08/19/2015 . Chronic  sinusitis  . Complication of anesthesia   slow to wake up . COPD (chronic obstructive pulmonary disease) ([facility])  . Coronary artery disease   Dr. Burt Knack had stress test done . DDD (degenerative disc disease), lumbar  . Depressive disorder, not elsewhere classified  . Diseases of lips  . Disorder of bone and cartilage, unspecified  . Dizziness and giddiness  . DVT (deep venous thrombosis) ([facility])  . Dysrhythmia   irregular . Effects of radiation, unspecified  . Emphysema  .  Encounter for antineoplastic immunotherapy 08/19/2015 . Fibromyalgia  . Functional urinary incontinence  . GERD (gastroesophageal reflux disease)  . Hard of hearing, right  . Heart murmur  . Herpes zoster without mention of complication  . Hip pain, chronic 09/17/2014 . History of blood clots   "2 in my aorta" . History of blood transfusion  . History of bronchitis  . Hx of radiation therapy 04/28/11 -06/08/11  right upper lobe-lung . Hyperlipidemia  . Hyperpotassemia  . Hypertension  . Hypopotassemia  . Kidney infection   "related to chemo" . Lumbago  . Malignant neoplasm of bronchus and lung, unspecified site  . Mental disorder  . Muscle weakness (generalized)  . Muscle weakness (generalized)  . Non-small cell lung cancer ([facility]) 03/31/2011  Adenocarcinoma Rt upper lobe mass . Osteoarthrosis, unspecified whether generalized or localized, unspecified site  . Osteoporosis  . Other abnormal blood chemistry  . Other and unspecified hyperlipidemia  . PAD (peripheral artery disease) ([place])  . Pain in thoracic spine  . Peripheral vascular disease ([facility])  . Polyneuropathy in diabetes(357.2)  . Primary cancer of right upper lobe of lung ([facility]) 02/08/2011  Biopsy Rt upper lobe mass 03/31/11>>adenocarcinoma DIAGNOSIS: Stage IIB/IIIA non-small cell lung cancer consistent with adenocarcinoma diagnosed in December 2012.   PRIOR THERAPY:  1) Status post radiation therapy to the right upper lobe lung mass completed on 06/04/2011 under the care of Dr. Pablo Ledger.  2)  Systemic chemotherapy with carboplatin for AUC of 5 and Alimta 500 mg/M2 giving every 3  . Reflux  . Reflux esophagitis  . Respiratory failure with hypoxia 01/02/2010 . Restless legs syndrome (RLS)  . Scoliosis  . Screening for thyroid disorder  . Shortness of breath   "anytime really" . Sinus headache   "all the time" . Spasm of muscle  . Syncope and collapse  . Type II or unspecified type diabetes mellitus with neurological manifestations, uncontrolled(250.62)  . Type II or unspecified type diabetes mellitus without mention of complication, not stated as uncontrolled  . Type II or unspecified type diabetes mellitus without mention of complication, not stated as uncontrolled  . Unspecified constipation  . Unspecified essential hypertension  . Unspecified hearing loss  . Unspecified hereditary and idiopathic peripheral neuropathy  . Unspecified vitamin D deficiency  . Urinary tract infection, site not specified  Past Surgical History: Past Surgical History: Procedure Laterality Date . BRONCHOSCOPY  02/2011 . CARDIAC CATHETERIZATION   . CATARACT EXTRACTION  03/12/2013  Left Eye  . DILATION AND CURETTAGE OF UTERUS  1980's . EYE SURGERY Left 2014  Cataract . FETAL BLOOD TRANSFUSION  09/16/11 . JOINT REPLACEMENT  04/13/10  Left Hip . LUNG LOBECTOMY  1986  left . SEPTOPLASTY   . TOTAL HIP ARTHROPLASTY  01/2010  left . TUBAL LIGATION  1980's HPI: 72 yo female referred for OP MBS due to concerns pt may be aspirating.  PMH + for COPD, CHF, GER, stage III A lung cancer diagnosed 2012- had been undergoing chemo recently, XRT 1/16-2/26/13.  Per NP note, pt had been on reglan for gastroparesis but was taken off and concern for thrush/oral candidiasis present with white coating on tongue s/p nystain.  Pt reports now she is back on Reglan and takes Zantac.  She admits to dysphagia x 4-5 months - and report is worse on infusion txs.  Xerostomia reported as well. Per [facility] note, pt was to see ENT and was advised to take allergy medicine  with decongestant to assist with ear pressure.  Pt  complains of coughing on secretions even - not associated with intake.  She states foods feel like they slide down the right side of her throat and stick or go down the wrong way.  She at times will cough food back up and expectorate it.  She denies requiring heimlich maneuver and states weight fluctuates.  Pt also reports she has not had pneumonia recently.  Voice may be different at times per pt.  No prior evaluations completed.    Subjective: pt awake in chair Assessment / Plan / Recommendation CHL IP CLINICAL IMPRESSIONS 11/10/2016 Clinical Impression Pt presents with minimal oropharyngeal dysphagia primarily due to decreased oral bolus control, piecemealing and premature spillage into pharynx with decreased timing of laryngeal closure. Laryngeal penetration of thin noted - large amount that cleared 99% with same swallow.  Trace penetrates were cleared with cued throat clear/cough.  Chin tuck posture decreased amount of laryngeal penetration to minimal but pt does report it was not comfortable for her to conduct. Pt's pharyngeal swallow was strong without significant residuals.  Reviewed findings of MBS with pt using video playback and discussed compensation strategies to mitigate her dysphagia symptoms.  Information was provided verbally and in writing.   SLP Visit Diagnosis Dysphagia, oropharyngeal phase (R13.12) Attention and concentration deficit following -- Frontal lobe and executive function deficit following -- Impact on safety and function Mild aspiration risk   No flowsheet data found.  No flowsheet data found. CHL IP DIET RECOMMENDATION 11/10/2016 SLP Diet Recommendations Regular solids;Dysphagia 3 (Mech soft) solids;Thin liquid Liquid Administration via Cup;Straw Medication Administration (with puree if problematic, start and follow with drink Compensations Minimize environmental distractions;Slow rate;Small sips/bites Postural Changes Remain  semi-upright after after feeds/meals (Comment);Seated upright at 90 degrees   CHL IP OTHER RECOMMENDATIONS 11/10/2016 Recommended Consults -- Oral Care Recommendations Oral care BID Other Recommendations --   No flowsheet data found.  No flowsheet data found.     CHL IP ORAL PHASE 11/10/2016 Oral Phase Impaired Oral - Pudding Teaspoon -- Oral - Pudding Cup -- Oral - Honey Teaspoon -- Oral - Honey Cup -- Oral - Nectar Teaspoon -- Oral - Nectar Cup Piecemeal swallowing;Premature spillage Oral - Nectar Straw -- Oral - Thin Teaspoon -- Oral - Thin Cup Piecemeal swallowing;Premature spillage Oral - Thin Straw Piecemeal swallowing;Premature spillage Oral - Puree WFL;Piecemeal swallowing Oral - Mech Soft -- Oral - Regular WFL Oral - Multi-Consistency -- Oral - Pill Delayed oral transit;Premature spillage Oral Phase - Comment --  CHL IP PHARYNGEAL PHASE 11/10/2016 Pharyngeal Phase Impaired Pharyngeal- Pudding Teaspoon -- Pharyngeal -- Pharyngeal- Pudding Cup -- Pharyngeal -- Pharyngeal- Honey Teaspoon -- Pharyngeal -- Pharyngeal- Honey Cup -- Pharyngeal -- Pharyngeal- Nectar Teaspoon -- Pharyngeal -- Pharyngeal- Nectar Cup WFL Pharyngeal -- Pharyngeal- Nectar Straw -- Pharyngeal -- Pharyngeal- Thin Teaspoon -- Pharyngeal -- Pharyngeal- Thin Cup Penetration/Aspiration during swallow Pharyngeal Material enters airway, remains ABOVE vocal cords and not ejected out Pharyngeal- Thin Straw Reduced airway/laryngeal closure;Penetration/Aspiration during swallow;Pharyngeal residue - valleculae;Pharyngeal residue - pyriform Pharyngeal Material enters airway, remains ABOVE vocal cords and not ejected out Pharyngeal- Puree WFL Pharyngeal -- Pharyngeal- Mechanical Soft -- Pharyngeal -- Pharyngeal- Regular WFL Pharyngeal -- Pharyngeal- Multi-consistency -- Pharyngeal -- Pharyngeal- Pill WFL Pharyngeal -- Pharyngeal Comment timing of laryngeal closure impaired with pt piecemealing and premature spillage with oral residuals spilling into  pharynx, chin tuck posture tested and decreased amount of penetration but did not fully clear  CHL IP CERVICAL ESOPHAGEAL PHASE 11/10/2016 Cervical Esophageal Phase WFL Pudding Teaspoon --  Pudding Cup -- Honey Teaspoon -- Honey Cup -- Nectar Teaspoon -- Nectar Cup -- Nectar Straw -- Thin Teaspoon -- Thin Cup -- Thin Straw -- Puree -- Mechanical Soft -- Regular -- Multi-consistency -- Pill -- Cervical Esophageal Comment -- CHL IP GO 11/10/2016 Functional Assessment Tool Used MBS Functional Limitations Swallowing Swallow Current Status (G2542) CI Swallow Goal Status (H0623) CI Swallow Discharge Status (J6283) CI Macario Golds 11/10/2016, 3:30 PM Luanna Salk, MS [facility] SLP [phone]  CLINICAL DATA:  71 year old female with lung cancer, most recently status post immunotherapy. Recent progressive dysphagia. EXAM: MODIFIED BARIUM SWALLOW TECHNIQUE: Different consistencies of barium were administered orally to the patient by the Speech Pathologist. Imaging of the pharynx was performed in the lateral projection. FLUOROSCOPY TIME:  Fluoroscopy Time:  1 minutes 24 seconds Radiation Exposure Index (if provided by the fluoroscopic device): 3.69my Number of Acquired Spot Images: 0 COMPARISON:  Paranasal sinus CT 11/08/2016. FINDINGS: Thin liquid- small sips from a cup were normal aside from mild premature spill. When drinking from a straw penetration was noted, without aspiration. This was not improved with the chin-tuck maneuver. Nectar thick liquid- normal aside from mild premature spill. Pure- normal aside from mild premature spill. Pure with cracker- within normal limits Barium tablet - the barium tablet was swallowed using thin liquids without difficulty, although penetration of the thin barium liquid occurred. No aspiration occurred. IMPRESSION: Premature spill with intermittent penetration of thin liquids. Please refer to the Speech Pathologists report for complete details and recommendations. Electronically Signed    By: HGenevie AnnM.D.   On: 11/10/2016 14:19   Ct Maxillofacial Ltd Wo Cm  Result Date: 11/08/2016 CLINICAL DATA:  71year old female with recurrent sinusitis symptoms. Right greater than left sinus drainage. Bilateral ear ache for 4-5 months. EXAM: CT PARANASAL SINUS LIMITED WITHOUT CONTRAST TECHNIQUE: Non-contiguous multidetector CT images of the paranasal sinuses were obtained in a single plane without contrast. COMPARISON:  Brain MRI 01/23/2016, and earlier. FINDINGS: Stable visible brain parenchyma. Negative visible noncontrast deep soft tissue spaces of the face, orbit and scalp soft tissues. Visible bilateral tympanic cavities and mastoids appear clear. The visible bilateral paranasal sinuses are stable and well pneumatized - with only an isolated small area of possible mucosal thickening in a hyperplastic posterior right ethmoid air cell (series 3, image 6). Chronic nasal septal perforation or erosion is stable since 2017 (same image). No acute osseous abnormality identified. IMPRESSION: 1. Negative paranasal sinuses. 2. Visible tympanic cavities and mastoids appear well pneumatized. 3. Chronic anterior nasal septal perforation. Electronically Signed   By: HGenevie AnnM.D.   On: 11/08/2016 15:31    ASSESSMENT AND PLAN:  This is a very pleasant 71years old white female with recurrent non-small cell lung cancer, adenocarcinoma that was initially diagnosed as unresectable stage IIA in December 2012 status post systemic chemotherapy as well as immunotherapy. She is currently on treatment with Nivolumab status post 4 cycles of the new regimen. She is tolerating the treatment well with no significant adverse effects. The patient has repeat CT scan of the chest for restaging of her disease. I personally and independently reviewed the scan images and discuss the results and showed the images to the patient and her son. Her scan in general is very stable compared to the most recent one. I recommended for the  patient to continue her current treatment with Nivolumab and she will proceed with cycle #5 today. She had a recent scan of the sinuses for questionable sinusitis that  was unremarkable. For COPD, the patient will continue with her current treatment and I advised her to keep her follow-up appointment with [name]. I would see her back for follow-up visit in 2 weeks for evaluation before starting cycle #6. She was advised to call immediately if she has any concerning symptoms in the interval. The patient voices understanding of current disease status and treatment options and is in agreement with the current care plan. All questions were answered. The patient knows to call the clinic with any problems, questions or concerns. We can certainly see the patient much sooner if necessary. I spent 15 minutes counseling the patient face to face. The total time spent in the appointment was 25 minutes. Disclaimer: This note was dictated with voice recognition software. Similar sounding words can inadvertently be transcribed and may not be corrected upon review.

## 2016-11-12 ENCOUNTER — Encounter: Payer: Self-pay | Admitting: *Deleted

## 2016-11-12 ENCOUNTER — Telehealth: Payer: Self-pay | Admitting: Internal Medicine

## 2016-11-12 ENCOUNTER — Telehealth: Payer: Self-pay | Admitting: *Deleted

## 2016-11-12 DIAGNOSIS — R531 Weakness: Secondary | ICD-10-CM

## 2016-11-12 DIAGNOSIS — R0602 Shortness of breath: Secondary | ICD-10-CM

## 2016-11-12 DIAGNOSIS — C3411 Malignant neoplasm of upper lobe, right bronchus or lung: Secondary | ICD-10-CM

## 2016-11-12 DIAGNOSIS — R269 Unspecified abnormalities of gait and mobility: Secondary | ICD-10-CM

## 2016-11-12 DIAGNOSIS — J439 Emphysema, unspecified: Secondary | ICD-10-CM

## 2016-11-12 NOTE — Telephone Encounter (Signed)
"  Need a cough medicine.  My chest feels clogged up.  Robitussin DM, Thera Flu suggested in the past.  Can these be used long term?   My Hgb keeps dropping.  No wonder I'm so weak.  Could you ask the doctor or his nurse to call me about my lab results.Monday.   Yesterday I was told a transport chair would be ordered, I called [facility] at 5:30 pm, they have not received order yet.  The nurse knows about this.  Received call today on swallowing study.  Results show I have a throat problem but not given a diagnosis or what to do to help this problem.  Could you find out?"   Denies fever, respiratory changes or any new lung symptoms.  "I just need something for cough.  No equipment prescriptions seen at this time. Will check Pod 3.  Instructed to call ordering provider for swallowiing study clarification.   Located PR order in order review from [employer].    Called [facility] [phone], "no receipt of original order through [facility] or this nurses re-entry of orders.  Our orders are usually use faxed.  Fax the order [phone]." Called patient with instructions to pick up wheelchair which may not be delivered.  Ordered two options for her to pick which suits her needs.  Per [facility], Transport chair weighs less, both will fit in a standard car and halls of her home.

## 2016-11-12 NOTE — Telephone Encounter (Signed)
Called and spoke to patient about her appointments in august and September.

## 2016-11-15 ENCOUNTER — Ambulatory Visit: Payer: Medicare Other | Admitting: Internal Medicine

## 2016-11-16 ENCOUNTER — Ambulatory Visit (INDEPENDENT_AMBULATORY_CARE_PROVIDER_SITE_OTHER): Payer: Medicare Other | Admitting: Nurse Practitioner

## 2016-11-16 ENCOUNTER — Encounter: Payer: Self-pay | Admitting: Nurse Practitioner

## 2016-11-16 VITALS — BP 124/64 | HR 92 | Temp 97.8°F | Resp 18 | Ht 66.0 in | Wt 170.2 lb

## 2016-11-16 DIAGNOSIS — B370 Candidal stomatitis: Secondary | ICD-10-CM | POA: Diagnosis not present

## 2016-11-16 DIAGNOSIS — B3781 Candidal esophagitis: Secondary | ICD-10-CM

## 2016-11-16 DIAGNOSIS — R413 Other amnesia: Secondary | ICD-10-CM | POA: Diagnosis not present

## 2016-11-16 DIAGNOSIS — I779 Disorder of arteries and arterioles, unspecified: Secondary | ICD-10-CM

## 2016-11-16 DIAGNOSIS — E782 Mixed hyperlipidemia: Secondary | ICD-10-CM | POA: Diagnosis not present

## 2016-11-16 DIAGNOSIS — K219 Gastro-esophageal reflux disease without esophagitis: Secondary | ICD-10-CM

## 2016-11-16 DIAGNOSIS — R1312 Dysphagia, oropharyngeal phase: Secondary | ICD-10-CM | POA: Diagnosis not present

## 2016-11-16 MED ORDER — PANTOPRAZOLE SODIUM 40 MG PO TBEC: 40.0000 mg | DELAYED_RELEASE_TABLET | Freq: Every day | ORAL | 3 refills | Status: DC

## 2016-11-16 MED ORDER — SACCHAROMYCES BOULARDII 250 MG PO CAPS: 250.0000 mg | ORAL_CAPSULE | Freq: Two times a day (BID) | ORAL | 1 refills | Status: DC

## 2016-11-16 NOTE — Progress Notes (Signed)
Careteam: Patient Care Team: Gayland Curry, DO as PCP - General (Geriatric Medicine) Collene Gobble, MD as Consulting Physician (Pulmonary Disease) Elam Dutch, MD as Consulting Physician (Vascular Surgery) Curt Bears, MD as Consulting Physician (Oncology)  Advanced Directive information Does Patient Have a Medical Advance Directive?: No  Allergies  Allergen Reactions  . Iohexol Shortness Of Breath and Other (See Comments)    "burns me up inside"    Pt does fine with 13 hour prep  01/17/12  . Other     CAN'T STAND THE SMELL OF PURE BLEACH; "HAVE TO BACK UP AND POUR AND DON'T BREATH IT TIL i GET CAP BACK ON; DILUTE IT TO SPRAY"  . Anoro Ellipta [Umeclidinium-Vilanterol] Swelling    Swelling of lips and mouth  . Codeine Itching and Nausea And Vomiting    REACTION: GI upset  . Protonix [Pantoprazole Sodium] Diarrhea    Chief Complaint  Patient presents with  . Medical Management of Chronic Issues    Pt is being seen for 6 week follow up for medication management. Pt also wants to discuss swallowing study results.  . Other    Son in room     HPI: Patient is a 71 y.o. female seen in the office today to follow up.  Would like to go over swallow study  Questions about nystatin solution- not sure if she should swish or swallow; conts to have problems with thrush and sore throat.  Did not want to take Reglan because of side effects- it was stopped at last appt but she cont to take it.  Having trouble with her gallbladder and indigestion. Would like to go back on PPI- would like to try nexium or protonix .  Pt still has not been able to get nebulizer machine.   Review of Systems:  Review of Systems  Constitutional: Positive for malaise/fatigue. Negative for chills and fever.  HENT: Negative for congestion.   Eyes: Negative for blurred vision.  Respiratory: Positive for shortness of breath. Negative for cough, hemoptysis, sputum production and wheezing.     Cardiovascular: Negative for chest pain and palpitations.       Leg pain  Gastrointestinal: Positive for heartburn. Negative for abdominal pain, blood in stool, constipation, diarrhea, melena, nausea and vomiting.  Genitourinary: Negative for dysuria.  Musculoskeletal: Positive for joint pain, myalgias and neck pain. Negative for falls.  Skin: Negative for itching and rash.  Neurological: Positive for tingling, sensory change and weakness. Negative for dizziness and loss of consciousness.  Endo/Heme/Allergies:       Diabetes  Psychiatric/Behavioral: Positive for depression. Negative for suicidal ideas. The patient is nervous/anxious and has insomnia.     Past Medical History:  Diagnosis Date  . Acute posthemorrhagic anemia   . Acute sinusitis, unspecified   . Acute upper respiratory infections of unspecified site   . Adenocarcinoma, lung ([place])    upper left lobe  . Anemia in neoplastic disease   . Anemia, unspecified   . Anginal pain ([place])   . Anxiety   . Anxiety state, unspecified   . Arthritis    "all over my body"  . Asthma   . Asymptomatic varicose veins   . Atherosclerosis of native arteries of the extremities, unspecified   . Atrial fibrillation ([place])   . Bronchial pneumonia    history  . 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 Chemotherapy adverse reaction 09/15/11   "makes me light headed and pass out; this  is 3rd blood transfusion since going thru it"  . CHF (congestive heart failure) ([place])   . Chronic airway obstruction, not elsewhere classified   . Chronic fatigue 08/19/2015  . Chronic sinusitis   . Complication of anesthesia    slow to wake up  . COPD (chronic obstructive pulmonary disease) ([place])   . Coronary artery disease    Dr. Burt Knack had stress test done  . DDD (degenerative disc disease), lumbar   . Depressive disorder, not elsewhere classified   . Diseases of lips   . Disorder of bone and cartilage, unspecified   . Dizziness and giddiness   . DVT  (deep venous thrombosis) ([place])   . Dysrhythmia    irregular  . Effects of radiation, unspecified   . Emphysema   . Encounter for antineoplastic immunotherapy 08/19/2015  . Fibromyalgia   . Functional urinary incontinence   . GERD (gastroesophageal reflux disease)   . Hard of hearing, right   . Heart murmur   . Herpes zoster without mention of complication   . Hip pain, chronic 09/17/2014  . History of blood clots    "2 in my aorta"  . History of blood transfusion   . History of bronchitis   . Hx of radiation therapy 04/28/11 -06/08/11   right upper lobe-lung  . Hyperlipidemia   . Hyperpotassemia   . Hypertension   . Hypopotassemia   . Kidney infection    "related to chemo"  . Lumbago   . Malignant neoplasm of bronchus and lung, unspecified site   . Mental disorder   . Muscle weakness (generalized)   . Muscle weakness (generalized)   . Non-small cell lung cancer ([place]) 03/31/2011   Adenocarcinoma Rt upper lobe mass  . Osteoarthrosis, unspecified whether generalized or localized, unspecified site   . Osteoporosis   . Other abnormal blood chemistry   . Other and unspecified hyperlipidemia   . PAD (peripheral artery disease) ([place])   . Pain in thoracic spine   . Peripheral vascular disease ([place])   . 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 Primary cancer of right upper lobe of lung ([place]) 02/08/2011   Biopsy Rt upper lobe mass 03/31/11>>adenocarcinoma DIAGNOSIS: Stage IIB/IIIA non-small cell lung cancer consistent with adenocarcinoma diagnosed in December 2012.   PRIOR THERAPY:  1) Status post radiation therapy to the right upper lobe lung mass completed on 06/04/2011 under the care of Dr. Pablo Ledger.  2) Systemic chemotherapy with carboplatin for AUC of 5 and Alimta 500 mg/M2 giving every 3   . Reflux   . Reflux esophagitis   . Respiratory failure with hypoxia 01/02/2010  . Restless legs syndrome (RLS)   . Scoliosis   . Screening for thyroid disorder   . Shortness of breath     "anytime really"  . Sinus headache    "all the time"  . Spasm of muscle   . Syncope and collapse   . Type II or unspecified type diabetes mellitus with neurological manifestations, uncontrolled(250.62)   . Type II or unspecified type diabetes mellitus without mention of complication, not stated as uncontrolled   . Type II or unspecified type diabetes mellitus without mention of complication, not stated as uncontrolled   . Unspecified constipation   . Unspecified essential hypertension   . Unspecified hearing loss   . Unspecified hereditary and idiopathic peripheral neuropathy   . Unspecified vitamin D deficiency   . Urinary tract infection, site not specified    Past Surgical History:  Procedure Laterality Date  .  BRONCHOSCOPY  02/2011  . CARDIAC CATHETERIZATION    . CATARACT EXTRACTION  03/12/2013   Left Eye   . DILATION AND CURETTAGE OF UTERUS  1980's  . EYE SURGERY Left 2014   Cataract  . FETAL BLOOD TRANSFUSION  09/16/11  . JOINT REPLACEMENT  04/13/10   Left Hip  . LUNG LOBECTOMY  1986   left  . SEPTOPLASTY    . TOTAL HIP ARTHROPLASTY  01/2010   left  . TUBAL LIGATION  1980's   Social History:   reports that she quit smoking about 18 years ago. Her smoking use included Cigarettes. She has a 40.00 pack-year smoking history. She has never used smokeless tobacco. She reports that she does not drink alcohol or use drugs.  Family History  Problem Relation Age of Onset  . Bone cancer Father   . Cancer Father        Bone  . Diabetes Mother   . Deep vein thrombosis Mother   . Heart disease Mother        Heart Disease before age 44-  PVD  . Hyperlipidemia Mother   . Hypertension Mother   . Varicose Veins Mother   . Arthritis Daughter     Medications: Patient's Medications  New Prescriptions   No medications on file  Previous Medications   ACCU-CHEK SOFTCLIX LANCETS LANCETS    by Other route. Check blood sugar twice daily. Dx: E11.9, E11.49   ALBUTEROL (PROVENTIL  HFA;VENTOLIN HFA) 108 (90 BASE) MCG/ACT INHALER    Inhale 2 puffs into the lungs every 6 (six) hours as needed for wheezing or shortness of breath.   ALBUTEROL (PROVENTIL) (2.5 MG/3ML) 0.083% NEBULIZER SOLUTION    Take 3 mLs (2.5 mg total) by nebulization every 6 (six) hours as needed for wheezing or shortness of breath.   ALPRAZOLAM (XANAX) 1 MG TABLET    TAKE 1 TABLET BY MOUTH THREE TIMES DAILY AS NEEDED FOR ANXIETY   ASPIRIN 81 MG TABLET    Take 81 mg by mouth daily.   BLOOD GLUCOSE MONITORING SUPPL (ACCU-CHEK AVIVA PLUS) W/DEVICE KIT    Use as directed to check blood sugar Dx: E11.9, E11.49   CALCIUM CARBONATE-VIT D-MIN (CALTRATE PLUS PO)    Take 1 tablet by mouth daily.   DILTIAZEM (MATZIM LA) 240 MG 24 HR TABLET    Take 240 mg by mouth daily.   FEXOFENADINE (ALLEGRA) 180 MG TABLET    Take 180 mg by mouth daily.   FLUTICASONE (FLONASE) 50 MCG/ACT NASAL SPRAY    INSTILL 2 SPRAYS IN EACH NOSTRIL TWICE DAILY *NEEDS APPOINTMENT*   HYDROCODONE-ACETAMINOPHEN (NORCO) 10-325 MG TABLET    Take One tablet by mouth every 6 hours if needed for pain   IPRATROPIUM-ALBUTEROL (COMBIVENT RESPIMAT) 20-100 MCG/ACT AERS RESPIMAT    Inhale 2 puffs into the lungs 4 (four) times daily.   METFORMIN (GLUCOPHAGE) 500 MG TABLET    Take 1 tablet (500 mg total) by mouth daily as needed (high blood sugar- 160+). Take one tablet at night for blood sugar   MULTIPLE VITAMINS-MINERALS (CENTRUM SILVER PO)    Take 1 tablet by mouth daily. Patient unsure of dose   NYSTATIN (MYCOSTATIN) 100000 UNIT/ML SUSPENSION    Use as directed 5 mLs (500,000 Units total) in the mouth or throat 4 (four) times daily.   POTASSIUM CHLORIDE (K-DUR,KLOR-CON) 10 MEQ TABLET    Take 1 tablet (10 mEq total) by mouth daily.   TRAMADOL (ULTRAM) 50 MG TABLET    Take  2 tablets (100 mg total) by mouth every 6 (six) hours as needed for severe pain.   TRIAMTERENE-HYDROCHLOROTHIAZIDE (MAXZIDE-25) 37.5-25 MG TABLET    TAKE 1 TABLET BY MOUTH DAILY  Modified  Medications   No medications on file  Discontinued Medications   CA PHOSPHATE-CHOLECALCIFEROL (CALTRATE GUMMY BITES) 250-400 MG-UNIT CHEW    Chew 1 each by mouth daily.   CETIRIZINE HCL 10 MG CAPS    Take 1 capsule by mouth daily.      Physical Exam:  Vitals:   11/16/16 1428  BP: 124/64  Pulse: 92  Resp: 18  Temp: 97.8 F (36.6 C)  TempSrc: Oral  SpO2: 98%  Weight: 170 lb 3.2 oz (77.2 kg)  Height: 5' 6" (1.676 m)   Body mass index is 27.47 kg/m.  Physical Exam  Constitutional: She is oriented to person, place, and time. She appears well-developed and well-nourished. No distress.  HENT:  Head: Normocephalic and atraumatic.  Cardiovascular: Normal rate and regular rhythm.   Pulmonary/Chest: Effort normal and breath sounds normal.  Using portable O2  Abdominal: Soft. Bowel sounds are normal.  Musculoskeletal: She exhibits no edema or tenderness.  Neurological: She is alert and oriented to person, place, and time.  Skin: Skin is warm and dry.  Psychiatric: She has a normal mood and affect.  Anxious, pressured speech    Labs reviewed: Basic Metabolic Panel:  Recent Labs  03/08/16 1717  09/17/16 1301  10/14/16 0848 10/14/16 0848 10/28/16 0905 11/11/16 1209 11/11/16 1209  NA 138  < >  --   < >  --  142 142  --  142  K 3.1*  < >  --   < >  --  4.2 3.7  --  4.1  CL 95*  --   --   --   --   --   --   --   --   CO2  --   < >  --   < >  --  29 25  --  28  GLUCOSE 123*  < >  --   < >  --  97 102  --  87  BUN 13  < >  --   < >  --  11.5 15.4  --  13.3  CREATININE 1.00  < >  --   < >  --  1.0 1.1  --  1.0  CALCIUM  --   < >  --   < >  --  9.6 10.1  --  9.5  TSH  --   < > 1.967  --  2.776  --   --  1.001  --   < > = values in this interval not displayed. Liver Function Tests:  Recent Labs  10/14/16 0848 10/28/16 0905 11/11/16 1209  AST _0 ALT _1 ALKPHOS 84 90 102  BILITOT 0.43 0.34 0.26  PROT 6.3* 6.6 6.3*  ALBUMIN 3.5 3.6 3.6   No results for  input(s): LIPASE, AMYLASE in the last 8760 hours. No results for input(s): AMMONIA in the last 8760 hours. CBC:  Recent Labs  10/14/16 0848 10/28/16 0905 11/11/16 1209  WBC 8.8 8.2 7.7  NEUTROABS 5.3 4.4 4.3  HGB 11.3* 12.6 11.1*  HCT 35.0 37.8 34.6*  MCV 93.1 91.4 94.0  PLT 209 239 226   Lipid Panel: No results for input(s): CHOL, HDL, LDLCALC, TRIG, CHOLHDL, LDLDIRECT in the last 8760 hours. TSH:  Recent Labs  09/17/16 1301 10/14/16 0848 11/11/16 1209  TSH 1.967 2.776 1.001   A1C: Lab Results  Component Value Date   HGBA1C 5.5 08/06/2015   mini-mental status exam MMSE - Mini Mental State Exam 07/01/2016  Orientation to time 4  Orientation to Place 5  Registration 3  Attention/ Calculation 5  Recall 2  Language- name 2 objects 2  Language- repeat 1  Language- follow 3 step command 3  Language- read & follow direction 1  Write a sentence 1  Copy design 1  Total score 28     Assessment/Plan 1. Gastroesophageal reflux disease, esophagitis presence not specified Ongoing, interested in trying protonix. Has very poor diet. Drank coffee this morning and has not had anything to eat today. Son states she eats a lot of spicy foods as well. Information given on proper diet and will start protonix.  - pantoprazole (PROTONIX) 40 MG tablet; Take 1 tablet (40 mg total) by mouth daily.  Dispense: 30 tablet; Refill: 3  2. Thrush of mouth and esophagus ([place]) -took 1 dose of nystatin and then stopped. Encouraged her to take this ACHS for 7 days. To notify if symptoms persist or fail to improve - saccharomyces boulardii (FLORASTOR) 250 MG capsule; Take 1 capsule (250 mg total) by mouth 2 (two) times daily.  Dispense: 60 capsule; Refill: 1  3. Oropharyngeal dysphagia Went over swallow evaluation. Pt aware of modifications needed.   4. Mixed hyperlipidemia - Lipid panel; Future  5. Memory loss -MMSE 28/30 this year however pt and son reporting worsening memory and question  if she should be living alone. Pt would like to move to SNF. Unsure what their choices or options are. pts son now going with her to Dr appt bc she mixes up medication and advice given by providers.  - Ambulatory referral to [facility] for community resources and medication management.   Next appt: 6 weeks.  Carlos American. Harle Battiest  [facility] [phone] 8 am - 5 pm) [phone] (after hours)

## 2016-11-16 NOTE — Telephone Encounter (Signed)
Asking about transport chair. I told her order was signed yesterday when [facility] rep came to office. Confirmed appts for nivolumab.Pt discussed " swallowing problem". She is also considering nursing home asking hospice criteria. I gave her information.

## 2016-11-16 NOTE — Patient Instructions (Addendum)
1) START Protonix daily 2) START Probiotics- take twice daily 3) use nystatin solution 4 times daily- swish and swallow.   Give this medication time to start working  Work on diet, make sure you are eating 3 nutritious meals a day  Food Choices for Gastroesophageal Reflux Disease, Adult When you have gastroesophageal reflux disease (GERD), the foods you eat and your eating habits are very important. Choosing the right foods can help ease the discomfort of GERD. Consider working with a diet and nutrition specialist (dietitian) to help you make healthy food choices. What general guidelines should I follow? Eating plan  Choose healthy foods low in fat, such as fruits, vegetables, whole grains, low-fat dairy products, and lean meat, fish, and poultry.  Eat frequent, small meals instead of three large meals each day. Eat your meals slowly, in a relaxed setting. Avoid bending over or lying down until 2-3 hours after eating.  Limit high-fat foods such as fatty meats or fried foods.  Limit your intake of oils, butter, and shortening to less than 8 teaspoons each day.  Avoid the following: ? Foods that cause symptoms. These may be different for different people. Keep a food diary to keep track of foods that cause symptoms. ? Alcohol. ? Drinking large amounts of liquid with meals. ? Eating meals during the 2-3 hours before bed.  Cook foods using methods other than frying. This may include baking, grilling, or broiling. Lifestyle   Maintain a healthy weight. Ask your health care provider what weight is healthy for you. If you need to lose weight, work with your health care provider to do so safely.  Exercise for at least 30 minutes on 5 or more days each week, or as told by your health care provider.  Avoid wearing clothes that fit tightly around your waist and chest.  Do not use any products that contain nicotine or tobacco, such as cigarettes and e-cigarettes. If you need help quitting,  ask your health care provider.  Sleep with the head of your bed raised. Use a wedge under the mattress or blocks under the bed frame to raise the head of the bed. What foods are not recommended? The items listed may not be a complete list. Talk with your dietitian about what dietary choices are best for you. Grains Pastries or quick breads with added fat. [place] toast. Vegetables Deep fried vegetables. [place] fries. Any vegetables prepared with added fat. Any vegetables that cause symptoms. For some people this may include tomatoes and tomato products, chili peppers, onions and garlic, and horseradish. Fruits Any fruits prepared with added fat. Any fruits that cause symptoms. For some people this may include citrus fruits, such as oranges, grapefruit, pineapple, and lemons. Meats and other protein foods High-fat meats, such as fatty beef or pork, hot dogs, ribs, ham, sausage, salami and bacon. Fried meat or protein, including fried fish and fried chicken. Nuts and nut butters. Dairy Whole milk and chocolate milk. Sour cream. Cream. Ice cream. Cream cheese. Milk shakes. Beverages Coffee and tea, with or without caffeine. Carbonated beverages. Sodas. Energy drinks. Fruit juice made with acidic fruits (such as orange or grapefruit). Tomato juice. Alcoholic drinks. Fats and oils Butter. Margarine. Shortening. Ghee. Sweets and desserts Chocolate and cocoa. Donuts. Seasoning and other foods Pepper. Peppermint and spearmint. Any condiments, herbs, or seasonings that cause symptoms. For some people, this may include curry, hot sauce, or vinegar-based salad dressings. Summary  When you have gastroesophageal reflux disease (GERD), food and lifestyle choices  are very important to help ease the discomfort of GERD.  Eat frequent, small meals instead of three large meals each day. Eat your meals slowly, in a relaxed setting. Avoid bending over or lying down until 2-3 hours after eating.  Limit  high-fat foods such as fatty meat or fried foods. This information is not intended to replace advice given to you by your health care provider. Make sure you discuss any questions you have with your health care provider. Document Released: 03/29/2005 Document Revised: 03/30/2016 Document Reviewed: 03/30/2016 Elsevier Interactive Patient Education  2017 [employer].

## 2016-11-18 ENCOUNTER — Other Ambulatory Visit: Payer: Medicare Other

## 2016-11-19 ENCOUNTER — Telehealth: Payer: Self-pay

## 2016-11-19 NOTE — Therapy (Signed)
SLP returned patient's call today at [phone] as pt reports ongoing throat discomfort after MBS.  SLP advised pt she had oral thrush and was using mouthrinse prior to [facility] for sore throat and that MBS should not have worsened throat pain.  Patient reported appreciating call and stated her mouthrinse is not helping her.   She then immediately said she had to go as she had a call on the other line.    Luanna Salk, [place] [facility] SLP [phone]

## 2016-11-19 NOTE — Telephone Encounter (Signed)
Previous patient of Dr.Green's, no pending appointment with Dr.Reed. 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  Patient is requesting for this to be removed from her chart. I informed patient that this is under her past medical history and if we remove we would be indicating that she NEVER had a diagnosis of this. Patient was very upset and would not allow me to speak, patient was over-talking me and states she is coming in Monday for a lab appointment and she would like to be tested for Herpes to confirm that she does not have this.  I reviewed EPIC, diagnosis in chart since 2014 when patient was seeing Dr.Pandey.   I did not get the opportunity to explain to patient that herpes zoster is consider shingles and not a STI  Please advise

## 2016-11-21 NOTE — Telephone Encounter (Signed)
I'd recommend this be discussed at her appt with Janett Billow.  Janett Billow can explain the difference between herpes zoster and simplex and determine if she's actually had the shingles (zoster).  It also seems reasonable for her to be Jessica's pt due to her seeing her more often anyway and being more accessible lately.  Pt needs to be able to be seen frequently with her multiple chronic illnesses.

## 2016-11-22 ENCOUNTER — Other Ambulatory Visit: Payer: Medicare Other

## 2016-11-22 ENCOUNTER — Telehealth: Payer: Self-pay

## 2016-11-22 DIAGNOSIS — E782 Mixed hyperlipidemia: Secondary | ICD-10-CM

## 2016-11-22 LAB — LIPID PANEL
CHOL/HDL RATIO: 2.9 ratio (ref <5.0)
Cholesterol: 176 mg/dL (ref <200)
HDL: 61 mg/dL (ref >50)
LDL CALC: 96 mg/dL (ref <100)
Triglycerides: 93 mg/dL (ref <150)
VLDL: 19 mg/dL (ref <30)

## 2016-11-22 NOTE — Telephone Encounter (Signed)
Patient here in office with ongoing mouth concerns. Patient lips appear raw. Lips are pinkish/red and painful Patient states food burns her lips and instead of getting better her symptoms are getting worse.    Patient unable to be seen today (patient has to take her dog to the vet)   Please advise

## 2016-11-22 NOTE — Telephone Encounter (Signed)
Called patient but there was no answer and I could not leave a message due to no voicemail. Will try again later.

## 2016-11-22 NOTE — Telephone Encounter (Signed)
Her lips were not involved on last visit-sounds like she needs an OV Can use A&D ointment three times daily Did she take diflucan? She stated she had the Rx from Dr Nyoka Cowden Make sure she is staying hydrated and keeping lips moisturized

## 2016-11-22 NOTE — Telephone Encounter (Signed)
Discussed response with patient. Patient verbalized understanding of Dr.Reed's response, undated PCP to [place] per patient

## 2016-11-23 ENCOUNTER — Telehealth: Payer: Self-pay

## 2016-11-23 DIAGNOSIS — J029 Acute pharyngitis, unspecified: Secondary | ICD-10-CM | POA: Diagnosis not present

## 2016-11-23 NOTE — Telephone Encounter (Signed)
Noted to follow up with use if needed

## 2016-11-23 NOTE — Telephone Encounter (Signed)
Spoke with patient about her 8/30 appt and she states that she may have to cancel all appts as she went to an ENT today due to lip swelling and may have to stop her chemo.  I advised her that I would have one of Dr. Ellan Lambert nurses to call her 8/15.  I have left a message on their voicemail    Jarreau Callanan

## 2016-11-23 NOTE — Telephone Encounter (Signed)
Patient stated that she was going to see ENT today so she was going to see if they could help her. She did say that she would use the A&D ointment to keep her lips moisturized. She stated that she did start taking the diflucan last night.

## 2016-11-24 ENCOUNTER — Telehealth: Payer: Self-pay | Admitting: Medical Oncology

## 2016-11-24 ENCOUNTER — Telehealth: Payer: Self-pay | Admitting: Emergency Medicine

## 2016-11-24 NOTE — Telephone Encounter (Signed)
She is a hospice candidate because of her COPD not the cancer. [name] can give better estimates.

## 2016-11-24 NOTE — Telephone Encounter (Signed)
Called and spoke to [place], pharmacist, and was advised the pt is currently getting Combivent at 2 puffs QID but the max dose pt should be getting is 6 puffs per day. Josh states the directions should read: 1 puff QID and an additional 1-2 puffs if needed. This is needed by 8/17 morning so this can be filled by pt.   [name] please advise. Thanks.

## 2016-11-24 NOTE — Telephone Encounter (Signed)
I returned pt call. She said her "lips and mouth are on fire". Because of her symptoms she saw  Dr Arvil Chaco and he  told her the only thing to do is to stop chemotherapy. She cancelled appt tomorrow .  She wants to know how long it will be before she needs hospice.

## 2016-11-25 ENCOUNTER — Other Ambulatory Visit: Payer: Medicare Other

## 2016-11-25 ENCOUNTER — Ambulatory Visit: Payer: Medicare Other | Admitting: Internal Medicine

## 2016-11-25 ENCOUNTER — Telehealth: Payer: Self-pay

## 2016-11-25 ENCOUNTER — Ambulatory Visit: Payer: Medicare Other

## 2016-11-25 ENCOUNTER — Other Ambulatory Visit: Payer: Self-pay | Admitting: *Deleted

## 2016-11-25 DIAGNOSIS — J449 Chronic obstructive pulmonary disease, unspecified: Secondary | ICD-10-CM

## 2016-11-25 MED ORDER — ALBUTEROL SULFATE HFA 108 (90 BASE) MCG/ACT IN AERS: 2.0000 | INHALATION_SPRAY | Freq: Four times a day (QID) | RESPIRATORY_TRACT | 5 refills | Status: DC | PRN

## 2016-11-25 NOTE — Telephone Encounter (Signed)
Message left on clinical intake voicemail:   Patient was prescribed nystatin for oral concerns and is about to run out. Patient would like to know if she can have a refill sent to [facility] for delivery   Please advise

## 2016-11-26 ENCOUNTER — Other Ambulatory Visit: Payer: Self-pay | Admitting: Nurse Practitioner

## 2016-11-26 ENCOUNTER — Other Ambulatory Visit: Payer: Self-pay | Admitting: Internal Medicine

## 2016-11-26 ENCOUNTER — Other Ambulatory Visit: Payer: Self-pay | Admitting: Emergency Medicine

## 2016-11-26 NOTE — Telephone Encounter (Signed)
Rx written for viscous lidocaine 2% 100 ML, nystatin suspension 100 ml, and water/kool-aid mix 100 mL 15 ML QID

## 2016-11-26 NOTE — Telephone Encounter (Signed)
How did her appt go at the ENT? Apparently she had mouth/throat complaints and were planning on discussing this with them. Will also need their OV note.

## 2016-11-26 NOTE — Telephone Encounter (Signed)
Pt stated that ENT told her that her mouth would not heal while she was taking chemo, so she has stopped chemo for 1 month in order for mouth to heal. Pt states that the inside of her mouth feels raw and it burns to eat or drink anything. She also stated that her lips are extremely chapped and cracking open. She was told to use blistex ointment on lips.  Will request records for Dr. Lucia Gaskins, ENT  Please advise if nystatin refill is ok?

## 2016-11-26 NOTE — Telephone Encounter (Signed)
I called [facility] pharmacy to ask about the ratio of magic mouthwash. I was told that the ratio varies by provider and ingredients. It was suggested that I look on medical website to get different combinations of medications. Print out was given to [place].

## 2016-11-26 NOTE — Telephone Encounter (Signed)
Patient stated that she has spoken with her oncologist and it is ok to hold off on chemo for 1 month. Pt advised that rx for magic mouthwash will be sent to pharmacy.   Rx was faxed to [phone] [facility].

## 2016-11-26 NOTE — Telephone Encounter (Signed)
If she has taken the diflucan and use the nystatin already with the chemo on hold this should help. We could send in for magic mouthwash and have her consult her oncologist.

## 2016-11-26 NOTE — Telephone Encounter (Signed)
RB, please advise. Thank you.

## 2016-11-26 NOTE — Telephone Encounter (Signed)
Barnett Applebaum with [facility] is calling to follow up on the request for  Combivent. Barnett Applebaum stated prescription must be received by 11 am today if not then the patient will be be able to receive medication until Monday. [phone]

## 2016-11-29 ENCOUNTER — Telehealth: Payer: Self-pay | Admitting: Medical Oncology

## 2016-11-29 NOTE — Telephone Encounter (Signed)
Per Dr Julien Nordmann I told pt she is a hospice candidate because of her COPD not the cancer. [name] can give better estimates.

## 2016-11-30 ENCOUNTER — Other Ambulatory Visit: Payer: Self-pay | Admitting: Nurse Practitioner

## 2016-11-30 ENCOUNTER — Ambulatory Visit (INDEPENDENT_AMBULATORY_CARE_PROVIDER_SITE_OTHER): Payer: Medicare Other | Admitting: Physician Assistant

## 2016-11-30 ENCOUNTER — Other Ambulatory Visit: Payer: Self-pay

## 2016-11-30 ENCOUNTER — Encounter: Payer: Self-pay | Admitting: Physician Assistant

## 2016-11-30 ENCOUNTER — Other Ambulatory Visit: Payer: Self-pay | Admitting: Emergency Medicine

## 2016-11-30 VITALS — BP 142/52 | HR 104 | Ht 66.0 in | Wt 170.8 lb

## 2016-11-30 DIAGNOSIS — R0602 Shortness of breath: Secondary | ICD-10-CM

## 2016-11-30 DIAGNOSIS — I739 Peripheral vascular disease, unspecified: Secondary | ICD-10-CM | POA: Diagnosis not present

## 2016-11-30 DIAGNOSIS — I2510 Atherosclerotic heart disease of native coronary artery without angina pectoris: Secondary | ICD-10-CM | POA: Diagnosis not present

## 2016-11-30 DIAGNOSIS — J449 Chronic obstructive pulmonary disease, unspecified: Secondary | ICD-10-CM | POA: Diagnosis not present

## 2016-11-30 DIAGNOSIS — C3490 Malignant neoplasm of unspecified part of unspecified bronchus or lung: Secondary | ICD-10-CM

## 2016-11-30 DIAGNOSIS — I779 Disorder of arteries and arterioles, unspecified: Secondary | ICD-10-CM

## 2016-11-30 DIAGNOSIS — I10 Essential (primary) hypertension: Secondary | ICD-10-CM

## 2016-11-30 MED ORDER — MAGIC MOUTHWASH W/LIDOCAINE: ORAL | 1 refills | Status: DC

## 2016-11-30 NOTE — Patient Instructions (Signed)
Medication Instructions:  Your physician recommends that you continue on your current medications as directed. Please refer to the Current Medication list given to you today.   Labwork: TODAY BMET, PRO BNP  Testing/Procedures: 1. Your physician has requested that you have a lexiscan myoview. For further information please visit [email or web address]. Please follow instruction sheet, as given.  2. Your physician has requested that you have an echocardiogram. Echocardiography is a painless test that uses sound waves to create images of your heart. It provides your doctor with information about the size and shape of your heart and how well your heart's chambers and valves are working. This procedure takes approximately one hour. There are no restrictions for this procedure.    Follow-Up: [employer], PAC IN 1 MONTH; IF POSSIBLE SAME DAY DR. Burt Knack IS IN THE OFFICE  Any Other Special Instructions Will Be Listed Below (If Applicable).     If you need a refill on your cardiac medications before your next appointment, please call your pharmacy.

## 2016-11-30 NOTE — Telephone Encounter (Signed)
Rx has been sent in. Nothing further was needed. 

## 2016-11-30 NOTE — Progress Notes (Signed)
Cardiology Office Note:    Date:  11/30/2016   ID:  [name], [name] 1945/04/15, MRN [id number]  PCP:  Kathleen Chandler, NP  Cardiologist:  Dr. Sherren Mocha  Oncologist: [name] Pulmonologist: [name]   Referring MD: Kathleen Curry, DO   Chief Complaint  Patient presents with  . Shortness of Breath    History of Present Illness:    [name] is a 71 y.o. female with a hx of nonobstructive CAD, COPD, lung CA status post chemotherapy and radiation, PAD followed by vascular surgery. Last seen by Dr. Burt Knack 8/16.  She is followed by [name].  When last seen in July, she was noted to have progressive shortness of breath and overall decline in health.  She had a follow up CT that was reviewed by her oncologist and felt to be stable.  Follow-up with cardiology was recommended.  [name] is here alone today. She notes worsening shortness of breath and occasional chest pain.  She feels out of breath most of the time and has worsening symptoms with minimal activity.  She also notes orthopnea and probable paroxysmal nocturnal dyspnea.  She has chronic LE edema.  She denies syncope.  Her chest pain is brief and occurs at night and sometimes with activity.  She feels it in her shoulders.  She also notes diaphoresis.  She has a chronic cough.  She denies any bleeding issues.    Prior CV studies:   The following studies were reviewed today:  Chest CT 11/11/16 IMPRESSION: 1. The majority of today's examination is stable compared to prior studies, although there continues to be slow progressive enlargement of a mass-like area of predominantly ground-glass attenuation and septal thickening in the right middle lobe. Given the slow progression compared to prior studies dating back to 2014, the possibility of slow-growing neoplasm such as a primary bronchogenic adenocarcinoma should be considered. Further evaluation with PET-CT is strongly recommended in the near future to better  evaluate this finding and identify potential areas of hypermetabolism which may serve as a target for future biopsy. 2. Mild diffuse bronchial wall thickening with mild to moderate centrilobular and paraseptal emphysema; imaging findings suggestive of underlying COPD. 3. Aortic atherosclerosis, in addition to 2 vessel coronary artery disease. Assessment for potential risk factor modification, dietary therapy or pharmacologic therapy may be warranted, if clinically indicated. 4. Oral contrast material in the esophagus from recent swallow study, indicative of gastroesophageal reflux.  Echo 7/15 EF 60-65, normal wall motion, grade 1 diastolic dysfunction, mild AI, mild LAE  Chest CTA 6/13 IMPRESSION: 1. No evidence of pulmonary embolism. 2.  Progressive posterior bilateral pulmonary opacities with areas of bronchiectasis.  Most likely evolving radiation change. Concurrent infection is difficult to exclude. 3.  The right upper lobe nodule is nearly completely obscured by overlying airspace disease but felt to be slightly smaller.  The right middle lobe lesion is no longer readily identified. 4.  Slight increase in small right pleural effusion. 5.  Esophageal dilatation and fluid suggests dysmotility or gastroesophageal reflux. 6.  Left humeral head avascular necrosis. 7.  Minimally enlarged right supraclavicular node.  Although not pathologic by size criteria, this warrants followup attention.  Cardiac MRI 01/2011 Impression: 1)    Small posterior pericardial effusion       2)    Large epicardial fat pad 3)    Normal EF 62% 4)    No hyperenhancement 5)    Mild LAE 6)  Suspicious 3.5cm lesion in the RUL concerning for lung caner  Nuclear stress test 01/2011 Probably normal stress nuclear study with a small fixed anterior defect suggestive of breast attenuation but no ischemia.   EF 89  Cardiac Catheterization 01/2005 Vigorous LVF  LAD 30-50 LCx irregularities  Past  Medical History:  Diagnosis Date  . Acute posthemorrhagic anemia   . Adenocarcinoma, lung ([place])    upper left lobe  . Anemia in neoplastic disease   . Anginal pain ([place])   . Anxiety   . Arthritis    "all over my body"  . Asthma   . Asymptomatic varicose veins   . Atherosclerosis of native arteries of the extremities, unspecified   . Atrial fibrillation ([place])   . Bronchial pneumonia    history  . 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 Chemotherapy adverse reaction 09/15/11   "makes me light headed and pass out; this is 3rd blood transfusion since going thru it"  . CHF (congestive heart failure) ([place])   . Chronic airway obstruction, not elsewhere classified   . Chronic fatigue 08/19/2015  . Chronic sinusitis   . Complication of anesthesia    slow to wake up  . COPD (chronic obstructive pulmonary disease) ([place])   . Coronary artery disease    Dr. Burt Knack had stress test done  . DDD (degenerative disc disease), lumbar   . Depressive disorder, not elsewhere classified   . Diseases of lips   . Disorder of bone and cartilage, unspecified   . Dizziness and giddiness   . DVT (deep venous thrombosis) ([place])   . Dysrhythmia    irregular  . Effects of radiation, unspecified   . Emphysema   . Encounter for antineoplastic immunotherapy 08/19/2015  . Fibromyalgia   . Functional urinary incontinence   . GERD (gastroesophageal reflux disease)   . Hard of hearing, right   . Heart murmur   . Herpes zoster without mention of complication   . Hip pain, chronic 09/17/2014  . History of blood clots    "2 in my aorta"  . History of blood transfusion   . History of bronchitis   . Hx of radiation therapy 04/28/11 -06/08/11   right upper lobe-lung  . Hyperlipidemia   . Hyperpotassemia   . Hypertension   . Hypopotassemia   . Kidney infection    "related to chemo"  . Lumbago   . Malignant neoplasm of bronchus and lung, unspecified site   . Mental disorder   . Muscle weakness (generalized)   .  Muscle weakness (generalized)   . Non-small cell lung cancer ([place]) 03/31/2011   Adenocarcinoma Rt upper lobe mass  . Osteoarthrosis, unspecified whether generalized or localized, unspecified site   . Osteoporosis   . Other abnormal blood chemistry   . Other and unspecified hyperlipidemia   . PAD (peripheral artery disease) ([place])   . Pain in thoracic spine   . Peripheral vascular disease ([place])   . 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 Primary cancer of right upper lobe of lung ([place]) 02/08/2011   Biopsy Rt upper lobe mass 03/31/11>>adenocarcinoma DIAGNOSIS: Stage IIB/IIIA non-small cell lung cancer consistent with adenocarcinoma diagnosed in December 2012.   PRIOR THERAPY:  1) Status post radiation therapy to the right upper lobe lung mass completed on 06/04/2011 under the care of Dr. Pablo Ledger.  2) Systemic chemotherapy with carboplatin for AUC of 5 and Alimta 500 mg/M2 giving every 3   . Reflux   . Reflux  esophagitis   . Respiratory failure with hypoxia 01/02/2010  . Restless legs syndrome (RLS)   . Scoliosis   . Screening for thyroid disorder   . Shortness of breath    "anytime really"  . Sinus headache    "all the time"  . Spasm of muscle   . Syncope and collapse   . Type II or unspecified type diabetes mellitus with neurological manifestations, uncontrolled(250.62)   . Type II or unspecified type diabetes mellitus without mention of complication, not stated as uncontrolled   . Type II or unspecified type diabetes mellitus without mention of complication, not stated as uncontrolled   . Unspecified constipation   . Unspecified essential hypertension   . Unspecified hearing loss   . Unspecified hereditary and idiopathic peripheral neuropathy   . Unspecified vitamin D deficiency   . Urinary tract infection, site not specified     Past Surgical History:  Procedure Laterality Date  . BRONCHOSCOPY  02/2011  . CARDIAC CATHETERIZATION    . CATARACT EXTRACTION  03/12/2013   Left  Eye   . DILATION AND CURETTAGE OF UTERUS  1980's  . EYE SURGERY Left 2014   Cataract  . FETAL BLOOD TRANSFUSION  09/16/11  . JOINT REPLACEMENT  04/13/10   Left Hip  . LUNG LOBECTOMY  1986   left  . SEPTOPLASTY    . TOTAL HIP ARTHROPLASTY  01/2010   left  . TUBAL LIGATION  1980's    Current Medications: Current Meds  Medication Sig  . ACCU-CHEK SOFTCLIX LANCETS lancets by Other route. Check blood sugar twice daily. Dx: E11.9, E11.49  . albuterol (PROVENTIL HFA;VENTOLIN HFA) 108 (90 Base) MCG/ACT inhaler Inhale 2 puffs into the lungs every 6 (six) hours as needed for wheezing or shortness of breath.  Marland Kitchen albuterol (PROVENTIL) (2.5 MG/3ML) 0.083% nebulizer solution Take 3 mLs (2.5 mg total) by nebulization every 6 (six) hours as needed for wheezing or shortness of breath.  . ALPRAZolam (XANAX) 1 MG tablet TAKE 1 TABLET BY MOUTH THREE TIMES DAILY AS NEEDED FOR ANXIETY  . aspirin 81 MG tablet Take 81 mg by mouth daily.  . Blood Glucose Monitoring Suppl (ACCU-CHEK AVIVA PLUS) w/Device KIT Use as directed to check blood sugar Dx: E11.9, E11.49  . Calcium Carbonate-Vit D-Min (CALTRATE PLUS PO) Take 1 tablet by mouth daily.  . COMBIVENT RESPIMAT 20-100 MCG/ACT AERS respimat INHALE 1 PUFF INTO THE LUNGS FOUR TIMES A DAY WITH AN ADDITIONAL 1-2 PUFFS AS NEEDED *NOT TO EXCEED 6 PUFFS PER DAY*  . diltiazem (MATZIM LA) 240 MG 24 hr tablet Take 240 mg by mouth daily.  . fexofenadine (ALLEGRA) 180 MG tablet Take 180 mg by mouth daily.  . fluticasone (FLONASE) 50 MCG/ACT nasal spray INSTILL 2 SPRAYS IN EACH NOSTRIL TWICE DAILY *NEEDS APPOINTMENT*  . HYDROcodone-acetaminophen (NORCO) 10-325 MG tablet Take One tablet by mouth every 6 hours if needed for pain  . metFORMIN (GLUCOPHAGE) 500 MG tablet Take 1 tablet (500 mg total) by mouth daily as needed (high blood sugar- 160+). Take one tablet at night for blood sugar  . Multiple Vitamins-Minerals (CENTRUM SILVER PO) Take 1 tablet by mouth daily. Patient unsure  of dose  . nystatin (MYCOSTATIN) 100000 UNIT/ML suspension USE AS DIRECTED 5 mlLs (500,000 UNITS TOTAL) IN THE MOUTH OR THROAT 4 FOUR TIMES DAILY  . pantoprazole (PROTONIX) 40 MG tablet Take 1 tablet (40 mg total) by mouth daily.  . potassium chloride (K-DUR,KLOR-CON) 10 MEQ tablet TAKE 1 TABLET BY MOUTH EVERY  DAY  . traMADol (ULTRAM) 50 MG tablet Take 2 tablets (100 mg total) by mouth every 6 (six) hours as needed for severe pain.  Marland Kitchen triamterene-hydrochlorothiazide (MAXZIDE-25) 37.5-25 MG tablet TAKE 1 TABLET BY MOUTH DAILY     Allergies:   Iohexol; Other; Anoro ellipta [umeclidinium-vilanterol]; Codeine; and Protonix [pantoprazole sodium]   Social History   Social History  . Marital status: Divorced    Spouse name: N/A  . Number of children: 3  . Years of education: N/A   Occupational History  . disabled   . RETIRED Retired   Social History Main Topics  . Smoking status: Former Smoker    Packs/day: 1.00    Years: 40.00    Types: Cigarettes    Quit date: 02/10/1998  . Smokeless tobacco: Never Used  . Alcohol use No  . Drug use: No  . Sexual activity: No   Other Topics Concern  . None   Social History Narrative  . None     Family Hx: The patient's family history includes Arthritis in her daughter; Bone cancer in her father; Cancer in her father; Deep vein thrombosis in her mother; Diabetes in her mother; Heart disease in her mother; Hyperlipidemia in her mother; Hypertension in her mother; Varicose Veins in her mother.  ROS:   Please see the history of present illness.    Review of Systems  Cardiovascular: Positive for chest pain and dyspnea on exertion.  Neurological: Positive for dizziness, headaches and loss of balance.   All other systems reviewed and are negative.   EKGs/Labs/Other Test Reviewed:    EKG:  EKG is  ordered today.  The ekg ordered today demonstrates Sinus tachycardia, HR 104, RSR prime V1-V2, QTc 441 ms, no significant change compared to prior  tracings  Recent Labs: 11/11/2016: ALT 7; BUN 13.3; Creatinine 1.0; HGB 11.1; Platelets 226; Potassium 4.1; Sodium 142; TSH 1.001   Recent Lipid Panel Lab Results  Component Value Date/Time   CHOL 176 11/22/2016 02:25 PM   CHOL 207 (H) 12/18/2013 11:22 AM   TRIG 93 11/22/2016 02:25 PM   HDL 61 11/22/2016 02:25 PM   HDL 63 12/18/2013 11:22 AM   CHOLHDL 2.9 11/22/2016 02:25 PM   LDLCALC 96 11/22/2016 02:25 PM   LDLCALC 110 (H) 12/18/2013 11:22 AM    Physical Exam:    VS:  BP (!) 142/52   Pulse (!) 104   Ht 5' 6" (1.676 m)   Wt 170 lb 12.8 oz (77.5 kg)   SpO2 94%   BMI 27.57 kg/m     Wt Readings from Last 3 Encounters:  11/30/16 170 lb 12.8 oz (77.5 kg)  11/16/16 170 lb 3.2 oz (77.2 kg)  11/11/16 172 lb 1.6 oz (78.1 kg)     Physical Exam  Constitutional: She is oriented to person, place, and time. She appears well-developed and well-nourished. No distress.  HENT:  Head: Normocephalic and atraumatic.  Eyes: No scleral icterus.  Neck: No JVD present.  Cardiovascular: Normal rate and regular rhythm.   No murmur heard. Pulmonary/Chest: She has no rales.  Abdominal: Soft.  Musculoskeletal: She exhibits edema (trace-1+ bilat ankle edema).  Neurological: She is alert and oriented to person, place, and time.  Skin: Skin is warm and dry.  Psychiatric: She has a normal mood and affect.    ASSESSMENT:    1. Shortness of breath   2. Coronary artery disease involving native coronary artery of native heart without angina pectoris   3. Essential hypertension  4. PAD (peripheral artery disease) ([place])   5. Chronic obstructive pulmonary disease, unspecified COPD type ([place])   6. Malignant neoplasm of lung, unspecified laterality, unspecified part of lung ([place])    PLAN:    In order of problems listed above:  1. Shortness of breath  Her breathing has worsened recently. This is likely all related to COPD and her recurrent lung CA.  However, she is a diabetic and has had chest  pain.  She also describes symptoms that sound consistent with volume excess. Aside from ankle edema she does not look volume overloaded.  I reviewed her chart.  The notes from her oncologist do not seem to suggest a poor prognosis at this point and she remains on chemotherapy.  Therefore, I think it is reasonable to pursue stress testing to assess for ischemia as a cause of her symptoms.  However, if she ultimately is noted to have severe diffuse disease, I do not think she would be a CABG candidate.  However, it is probably reasonable to pursue PCI if a focal lesion can be approached.    -  Arrange Nuclear stress test   -  Arrange echocardiogram  -  Check BMET, BNP.  Add Lasix if BNP is high.  2. Coronary artery disease  Non-obs disease by LHC in 2006.  She is a diabetic and has had some atypical/typical chest pain.  Arrange Nuclear stress test as noted.  Continue ASA.  3. Essential hypertension Fair control. Continue to monitor.   4. PAD (peripheral artery disease) ([facility]) FU with [facility]  5. Chronic obstructive pulmonary disease, unspecified COPD type ([place]) FU with Pulmonology.  6. Malignant neoplasm of lung, unspecified laterality, unspecified part of lung ([place]) FU with oncology.   Dispo:  Return in about 4 weeks (around 12/28/2016) for Follow up after testing, w/ Dr. Burt Knack, or Richardson Dopp, PA-C.   Medication Adjustments/Labs and Tests Ordered: Current medicines are reviewed at length with the patient today.  Concerns regarding medicines are outlined above.  Tests Ordered: Orders Placed This Encounter  Procedures  . Basic Metabolic Panel (BMET)  . Pro b natriuretic peptide  . Myocardial Perfusion Imaging  . EKG 12-Lead  . ECHOCARDIOGRAM COMPLETE   Medication Changes: No orders of the defined types were placed in this encounter.   Signed, Richardson Dopp, PA-C  11/30/2016 4:06 PM    [facility] [address], [place], [place]  [address] Phone: [phone];  Fax: [phone]

## 2016-11-30 NOTE — Telephone Encounter (Signed)
If we are using the Respimat version, which we almost certainly are, then the script needs to be changed to 1 puff qid. Thanks

## 2016-11-30 NOTE — Telephone Encounter (Signed)
I called pharmacy and was told that they never received Rx. I faxed it again and asked that pharmacy call once they receive fax.

## 2016-11-30 NOTE — Addendum Note (Signed)
Addended by: Logan Bores on: 11/30/2016 02:19 PM   Modules accepted: Orders

## 2016-11-30 NOTE — Telephone Encounter (Signed)
[name] with [facility] called to indicate rx was submitted incorrecly, need ration and ingredients   Per Jessica 1:1 ration with Maalox 80 cc, Benadryl 80 cc, and Lidocaine 80 cc= 240 cc

## 2016-11-30 NOTE — Telephone Encounter (Signed)
Pharmacy called and said that they cannot fill the combination Rx that was sent due to the quantity.   A Rx for magic mouthwash has been pended for review.

## 2016-11-30 NOTE — Telephone Encounter (Signed)
Handwritten Rx was faxed to [facility] on 11/26/16. Will contact pharmacy today and refax.   Jessica-please document Rx in chart.

## 2016-11-30 NOTE — Telephone Encounter (Signed)
A new Rx for magic mouthwash with lidocaine was faxed to pharmacy.

## 2016-11-30 NOTE — Telephone Encounter (Signed)
RB please advise. Thanks.  

## 2016-11-30 NOTE — Telephone Encounter (Signed)
Incoming fax received indicating patient contacted the pharmacy for a prescription for Magic Mouthwash. [facility] does not have a rx/record on file for this medication, please advise

## 2016-12-01 ENCOUNTER — Ambulatory Visit (INDEPENDENT_AMBULATORY_CARE_PROVIDER_SITE_OTHER): Payer: Medicare Other | Admitting: Nurse Practitioner

## 2016-12-01 ENCOUNTER — Telehealth: Payer: Self-pay

## 2016-12-01 ENCOUNTER — Encounter: Payer: Self-pay | Admitting: Nurse Practitioner

## 2016-12-01 VITALS — BP 134/70 | HR 93 | Temp 98.1°F | Resp 12 | Ht 65.0 in | Wt 170.0 lb

## 2016-12-01 DIAGNOSIS — B372 Candidiasis of skin and nail: Secondary | ICD-10-CM

## 2016-12-01 DIAGNOSIS — Z0000 Encounter for general adult medical examination without abnormal findings: Secondary | ICD-10-CM | POA: Diagnosis not present

## 2016-12-01 DIAGNOSIS — Z1211 Encounter for screening for malignant neoplasm of colon: Secondary | ICD-10-CM

## 2016-12-01 DIAGNOSIS — E1142 Type 2 diabetes mellitus with diabetic polyneuropathy: Secondary | ICD-10-CM | POA: Diagnosis not present

## 2016-12-01 DIAGNOSIS — Z1212 Encounter for screening for malignant neoplasm of rectum: Secondary | ICD-10-CM | POA: Diagnosis not present

## 2016-12-01 LAB — BASIC METABOLIC PANEL
BUN / CREAT RATIO: 14 (ref 12–28)
BUN: 15 mg/dL (ref 8–27)
CHLORIDE: 99 mmol/L (ref 96–106)
CO2: 24 mmol/L (ref 20–29)
Calcium: 10.3 mg/dL (ref 8.7–10.3)
Creatinine, Ser: 1.05 mg/dL — ABNORMAL HIGH (ref 0.57–1.00)
GFR calc Af Amer: 61 mL/min/{1.73_m2} (ref >59)
GFR calc non Af Amer: 53 mL/min/{1.73_m2} — ABNORMAL LOW (ref >59)
GLUCOSE: 91 mg/dL (ref 65–99)
Potassium: 4.8 mmol/L (ref 3.5–5.2)
SODIUM: 142 mmol/L (ref 134–144)

## 2016-12-01 LAB — PRO B NATRIURETIC PEPTIDE: NT-PRO BNP: 160 pg/mL (ref 0–301)

## 2016-12-01 MED ORDER — TETANUS-DIPHTH-ACELL PERTUSSIS 5-2.5-18.5 LF-MCG/0.5 IM SUSP: 0.5000 mL | Freq: Once | INTRAMUSCULAR | 0 refills | Status: AC

## 2016-12-01 MED ORDER — ZOSTER VAC RECOMB ADJUVANTED 50 MCG/0.5ML IM SUSR: 0.5000 mL | Freq: Once | INTRAMUSCULAR | 1 refills | Status: AC

## 2016-12-01 MED ORDER — NYSTATIN 100000 UNIT/GM EX POWD: Freq: Three times a day (TID) | CUTANEOUS | 0 refills | Status: DC

## 2016-12-01 NOTE — Progress Notes (Signed)
Provider: [name], NP  Patient Care Team: Kathleen Chandler, NP as PCP - General (Geriatric Medicine) Kathleen Gobble, MD as Consulting Physician (Pulmonary Disease) Kathleen Dutch, MD as Consulting Physician (Vascular Surgery) Kathleen Bears, MD as Consulting Physician (Oncology)  Extended Emergency Contact Information Primary Emergency Contact: [name] Address: [place], [place] Phone: [phone] Work Phone: [phone] Mobile Phone: [phone] Relation: Daughter Allergies  Allergen Reactions  . Iohexol Shortness Of Breath and Other (See Comments)    "burns me up inside"    Pt does fine with 13 hour prep  01/17/12  . Other     CAN'T STAND THE SMELL OF PURE BLEACH; "HAVE TO BACK UP AND POUR AND DON'T BREATH IT TIL i GET CAP BACK ON; DILUTE IT TO SPRAY"  . Anoro Ellipta [Umeclidinium-Vilanterol] Swelling    Swelling of lips and mouth  . Codeine Itching and Nausea And Vomiting    REACTION: GI upset  . Protonix [Pantoprazole Sodium] Diarrhea   Goals of Care: Advanced Directive information Advanced Directives 11/16/2016  Does Patient Have a Medical Advance Directive? No  Would patient like information on creating a medical advance directive? -  Pre-existing out of facility DNR order (yellow form or pink MOST form) -     Chief Complaint  Patient presents with  . Medical Management of Chronic Issues    Extended Visit, EKG completed yesterday by Cardiologist, AWV completed 07/01/16, Foot exam due and MMSE 26/30 (passed clock drawing)  . Immunizations    No vaccines today, patient with shingles breakout   . Health Maintenance    Refused Hep C and colonoscopy. Patient will call to set up eye doctor appointment     HPI: Patient is a 71 y.o. female seen in today for an annual wellness exam.   Major hospitalization in the last year but seeing oncologist frequently due to lung cancer, getting chemo at the  cancer center.   Depression screen [name] 2/9 12/01/2016 07/01/2016 10/28/2015  Decreased Interest 0 0 0  Down, Depressed, Hopeless 0 2 1  PHQ - 2 Score 0 2 1  Altered sleeping - 1 3  Tired, decreased energy - 3 3  Change in appetite - 1 0  Feeling bad or failure about yourself  - 1 0  Trouble concentrating - 2 0  Moving slowly or fidgety/restless - 0 0  Suicidal thoughts - 0 0  PHQ-9 Score - 10 7  Difficult doing work/chores - Very difficult -  Some recent data might be hidden    Fall Risk  12/01/2016 11/16/2016 10/11/2016 07/01/2016 10/28/2015  Falls in the past year? Yes Yes Yes Yes No  Number falls in past yr: 2 or more 2 or more 2 or more 2 or more -  Comment - - - - -  Injury with Fall? No No Yes No -  Comment - - - - -   MMSE - Mini Mental State Exam 12/01/2016 07/01/2016  Orientation to time 4 4  Orientation to Place 5 5  Registration 3 3  Attention/ Calculation 5 5  Recall 0 2  Language- name 2 objects 2 2  Language- repeat 1 1  Language- follow 3 step command 3 3  Language- read & follow direction 1 1  Write a sentence 1 1  Copy design 1 1  Total score 26 28     Health Maintenance  Topic Date  Due  . FOOT EXAM  03/06/1956  . HEMOGLOBIN A1C  02/05/2016  . URINE MICROALBUMIN  08/05/2016  . OPHTHALMOLOGY EXAM  04/12/2017 (Originally 10/31/2015)  . COLONOSCOPY  04/12/2017 (Originally 03/06/1994)  . Hepatitis C Screening  04/12/2017 (Originally 1945-08-22)  . PNA vac Low Risk Adult (2 of 2 - PCV13) 04/12/2017 (Originally 03/27/2014)  . TETANUS/TDAP  06/10/2017 (Originally 03/07/1965)  . MAMMOGRAM  08/11/2018  . DEXA SCAN  Completed    Planning to call and get eye exam done, has not been recently (over 1 year ago) Dentition: recently had to have tooth pulled but does not go routinely  No routine exercise.  Diet is poor due to decrease appetite but tires to attempt heart healthy    Past Medical History:  Diagnosis Date  . Acute posthemorrhagic anemia   .  Adenocarcinoma, lung ([place])    upper left lobe  . Anemia in neoplastic disease   . Anginal pain ([place])   . Anxiety   . Arthritis    "all over my body"  . Asthma   . Asymptomatic varicose veins   . Atherosclerosis of native arteries of the extremities, unspecified   . Atrial fibrillation ([place])   . Bronchial pneumonia    history  . 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 Chemotherapy adverse reaction 09/15/11   "makes me light headed and pass out; this is 3rd blood transfusion since going thru it"  . CHF (congestive heart failure) ([place])   . Chronic airway obstruction, not elsewhere classified   . Chronic fatigue 08/19/2015  . Chronic sinusitis   . Complication of anesthesia    slow to wake up  . COPD (chronic obstructive pulmonary disease) ([place])   . Coronary artery disease    Dr. Burt Knack had stress test done  . DDD (degenerative disc disease), lumbar   . Depressive disorder, not elsewhere classified   . Diseases of lips   . Disorder of bone and cartilage, unspecified   . Dizziness and giddiness   . DVT (deep venous thrombosis) ([place])   . Dysrhythmia    irregular  . Effects of radiation, unspecified   . Emphysema   . Encounter for antineoplastic immunotherapy 08/19/2015  . Fibromyalgia   . Functional urinary incontinence   . GERD (gastroesophageal reflux disease)   . Hard of hearing, right   . Heart murmur   . Herpes zoster without mention of complication   . Hip pain, chronic 09/17/2014  . History of blood clots    "2 in my aorta"  . History of blood transfusion   . History of bronchitis   . Hx of radiation therapy 04/28/11 -06/08/11   right upper lobe-lung  . Hyperlipidemia   . Hyperpotassemia   . Hypertension   . Hypopotassemia   . Kidney infection    "related to chemo"  . Lumbago   . Malignant neoplasm of bronchus and lung, unspecified site   . Mental disorder   . Muscle weakness (generalized)   . Muscle weakness (generalized)   . Non-small cell lung cancer ([place])  03/31/2011   Adenocarcinoma Rt upper lobe mass  . Osteoarthrosis, unspecified whether generalized or localized, unspecified site   . Osteoporosis   . Other abnormal blood chemistry   . Other and unspecified hyperlipidemia   . PAD (peripheral artery disease) ([place])   . Pain in thoracic spine   . Peripheral vascular disease ([place])   . 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 Primary cancer of right  upper lobe of lung ([place]) 02/08/2011   Biopsy Rt upper lobe mass 03/31/11>>adenocarcinoma DIAGNOSIS: Stage IIB/IIIA non-small cell lung cancer consistent with adenocarcinoma diagnosed in December 2012.   PRIOR THERAPY:  1) Status post radiation therapy to the right upper lobe lung mass completed on 06/04/2011 under the care of Dr. Pablo Ledger.  2) Systemic chemotherapy with carboplatin for AUC of 5 and Alimta 500 mg/M2 giving every 3   . Reflux   . Reflux esophagitis   . Respiratory failure with hypoxia 01/02/2010  . Restless legs syndrome (RLS)   . Scoliosis   . Screening for thyroid disorder   . Shortness of breath    "anytime really"  . Sinus headache    "all the time"  . Spasm of muscle   . Syncope and collapse   . Type II or unspecified type diabetes mellitus with neurological manifestations, uncontrolled(250.62)   . Type II or unspecified type diabetes mellitus without mention of complication, not stated as uncontrolled   . Type II or unspecified type diabetes mellitus without mention of complication, not stated as uncontrolled   . Unspecified constipation   . Unspecified essential hypertension   . Unspecified hearing loss   . Unspecified hereditary and idiopathic peripheral neuropathy   . Unspecified vitamin D deficiency   . Urinary tract infection, site not specified     Past Surgical History:  Procedure Laterality Date  . BRONCHOSCOPY  02/2011  . CARDIAC CATHETERIZATION    . CATARACT EXTRACTION  03/12/2013   Left Eye   . DILATION AND CURETTAGE OF UTERUS  1980's  . EYE SURGERY Left  2014   Cataract  . FETAL BLOOD TRANSFUSION  09/16/11  . JOINT REPLACEMENT  04/13/10   Left Hip  . LUNG LOBECTOMY  1986   left  . SEPTOPLASTY    . TOTAL HIP ARTHROPLASTY  01/2010   left  . TUBAL LIGATION  1980's    Social History   Social History  . Marital status: Divorced    Spouse name: N/A  . Number of children: 3  . Years of education: N/A   Occupational History  . disabled   . RETIRED Retired   Social History Main Topics  . Smoking status: Former Smoker    Packs/day: 1.00    Years: 40.00    Types: Cigarettes    Quit date: 02/10/1998  . Smokeless tobacco: Never Used  . Alcohol use No  . Drug use: No  . Sexual activity: No   Other Topics Concern  . None   Social History Narrative  . None    Family History  Problem Relation Age of Onset  . Bone cancer Father   . Cancer Father        Bone  . Diabetes Mother   . Deep vein thrombosis Mother   . Heart disease Mother        Heart Disease before age 87-  PVD  . Hyperlipidemia Mother   . Hypertension Mother   . Varicose Veins Mother   . Arthritis Daughter     Review of Systems:  Review of Systems  Constitutional: Positive for activity change. Negative for chills and fever.  HENT: Positive for postnasal drip, sinus pressure and trouble swallowing. Negative for congestion, ear pain and sore throat.        Popping noise in ears  Eyes: Negative.   Respiratory: Positive for cough and shortness of breath. Negative for choking, chest tightness, wheezing and stridor.  Lung cancer. O2 dependent. Portable O2.  Cardiovascular: Negative for chest pain and palpitations.  Gastrointestinal: Positive for constipation. Negative for abdominal distention, abdominal pain, diarrhea and nausea.       Reflux frequently.  Endocrine:       DM on metformin  Musculoskeletal: Positive for arthralgias, back pain, myalgias and neck pain. Negative for gait problem.       Left hip pain  Skin: Positive for rash (under left  breast).  Neurological: Positive for dizziness (true vertigo), weakness (generalized), light-headedness and headaches. Negative for tremors and syncope.  Hematological:       Hx of anemia; currently normal hgb.  Psychiatric/Behavioral: Positive for dysphoric mood and sleep disturbance. Negative for hallucinations. The patient is not nervous/anxious and is not hyperactive.      Allergies as of 12/01/2016      Reactions   Iohexol Shortness Of Breath, Other (See Comments)   "burns me up inside"    Pt does fine with 13 hour prep  01/17/12   Other    CAN'T STAND THE SMELL OF PURE BLEACH; "HAVE TO BACK UP AND POUR AND DON'T BREATH IT TIL i GET CAP BACK ON; DILUTE IT TO SPRAY"   Anoro Ellipta [umeclidinium-vilanterol] Swelling   Swelling of lips and mouth   Codeine Itching, Nausea And Vomiting   REACTION: GI upset   Protonix [pantoprazole Sodium] Diarrhea      Medication List       Accurate as of 12/01/16  1:56 PM. Always use your most recent med list.          ACCU-CHEK AVIVA PLUS w/Device Kit Use as directed to check blood sugar Dx: E11.9, E11.49   ACCU-CHEK SOFTCLIX LANCETS lancets by Other route. Check blood sugar twice daily. Dx: E11.9, E11.49   albuterol (2.5 MG/3ML) 0.083% nebulizer solution Commonly known as:  PROVENTIL Take 3 mLs (2.5 mg total) by nebulization every 6 (six) hours as needed for wheezing or shortness of breath.   albuterol 108 (90 Base) MCG/ACT inhaler Commonly known as:  PROVENTIL HFA;VENTOLIN HFA Inhale 2 puffs into the lungs every 6 (six) hours as needed for wheezing or shortness of breath.   ALPRAZolam 1 MG tablet Commonly known as:  XANAX TAKE 1 TABLET BY MOUTH THREE TIMES DAILY AS NEEDED FOR ANXIETY   aspirin 81 MG tablet Take 81 mg by mouth daily.   CALTRATE PLUS PO Take 1 tablet by mouth daily.   CENTRUM SILVER PO Take 1 tablet by mouth daily. Patient unsure of dose   COMBIVENT RESPIMAT 20-100 MCG/ACT Aers respimat Generic drug:   Ipratropium-Albuterol INHALE 1 PUFF INTO THE LUNGS FOUR TIMES A DAY WITH AN ADDITIONAL 1-2 PUFFS AS NEEDED *NOT TO EXCEED 6 PUFFS PER DAY*   fexofenadine 180 MG tablet Commonly known as:  ALLEGRA Take 180 mg by mouth daily.   fluticasone 50 MCG/ACT nasal spray Commonly known as:  FLONASE INSTILL 2 SPRAYS IN EACH NOSTRIL TWICE DAILY *NEEDS APPOINTMENT*   HYDROcodone-acetaminophen 10-325 MG tablet Commonly known as:  NORCO Take One tablet by mouth every 6 hours if needed for pain   MATZIM LA 240 MG 24 hr tablet Generic drug:  diltiazem Take 240 mg by mouth daily.   metFORMIN 500 MG tablet Commonly known as:  GLUCOPHAGE Take 1 tablet (500 mg total) by mouth daily as needed (high blood sugar- 160+). Take one tablet at night for blood sugar   nystatin 100000 UNIT/ML suspension Commonly known as:  MYCOSTATIN USE AS DIRECTED 5  mlLs (500,000 UNITS TOTAL) IN THE MOUTH OR THROAT 4 FOUR TIMES DAILY   OXYGEN Inhale 4 L into the lungs.   pantoprazole 40 MG tablet Commonly known as:  PROTONIX Take 1 tablet (40 mg total) by mouth daily.   potassium chloride 10 MEQ tablet Commonly known as:  K-DUR,KLOR-CON TAKE 1 TABLET BY MOUTH EVERY DAY   Tdap 5-2.5-18.5 LF-MCG/0.5 injection Commonly known as:  BOOSTRIX Inject 0.5 mLs into the muscle once.   traMADol 50 MG tablet Commonly known as:  ULTRAM Take 2 tablets (100 mg total) by mouth every 6 (six) hours as needed for severe pain.   triamterene-hydrochlorothiazide 37.5-25 MG tablet Commonly known as:  MAXZIDE-25 TAKE 1 TABLET BY MOUTH DAILY   Zoster Vac Recomb Adjuvanted injection Commonly known as:  SHINGRIX Inject 0.5 mLs into the muscle once.            Discharge Care Instructions        Start     Ordered   12/01/16 0000  Zoster Vac Recomb Adjuvanted [facility]) injection   Once     12/01/16 1329   12/01/16 0000  Tdap ([place]) 5-2.5-18.5 LF-MCG/0.5 injection   Once     12/01/16 1329        Physical  Exam: Vitals:   12/01/16 1332  BP: 134/70  Pulse: 93  Resp: 12  Temp: 98.1 F (36.7 C)  TempSrc: Oral  SpO2: 95%  Weight: 170 lb (77.1 kg)  Height: 5' 5"  (1.651 m)   Body mass index is 28.29 kg/m. Physical Exam  Constitutional: She is oriented to person, place, and time. She appears well-developed and well-nourished. No distress.  HENT:  Head: Normocephalic and atraumatic.  Cardiovascular: Normal rate and regular rhythm.   Pulmonary/Chest: Effort normal and breath sounds normal.  Using portable O2  Abdominal: Soft. Bowel sounds are normal.  Musculoskeletal: She exhibits tenderness. She exhibits no edema.       Left hip: She exhibits decreased range of motion.  Neurological: She is alert and oriented to person, place, and time.  Skin: Skin is warm and dry. Rash (flat red rash under left breast) noted.  Psychiatric: She has a normal mood and affect.  Anxious, pressured speech    Labs reviewed: Basic Metabolic Panel:  Recent Labs  03/08/16 1717  09/17/16 1301  10/14/16 0848  10/28/16 0905 11/11/16 1209 11/11/16 1209 11/30/16 1627  NA 138  < >  --   < >  --   < > 142  --  142 142  K 3.1*  < >  --   < >  --   < > 3.7  --  4.1 4.8  CL 95*  --   --   --   --   --   --   --   --  99  CO2  --   < >  --   < >  --   < > 25  --  28 24  GLUCOSE 123*  < >  --   < >  --   < > 102  --  87 91  BUN 13  < >  --   < >  --   < > 15.4  --  13.3 15  CREATININE 1.00  < >  --   < >  --   < > 1.1  --  1.0 1.05*  CALCIUM  --   < >  --   < >  --   < >  10.1  --  9.5 10.3  TSH  --   < > 1.967  --  2.776  --   --  1.001  --   --   < > = values in this interval not displayed. Liver Function Tests:  Recent Labs  10/14/16 0848 10/28/16 0905 11/11/16 1209  AST 15 14 15   ALT 8 9 7   ALKPHOS 84 90 102  BILITOT 0.43 0.34 0.26  PROT 6.3* 6.6 6.3*  ALBUMIN 3.5 3.6 3.6   No results for input(s): LIPASE, AMYLASE in the last 8760 hours. No results for input(s): AMMONIA in the last 8760  hours. CBC:  Recent Labs  10/14/16 0848 10/28/16 0905 11/11/16 1209  WBC 8.8 8.2 7.7  NEUTROABS 5.3 4.4 4.3  HGB 11.3* 12.6 11.1*  HCT 35.0 37.8 34.6*  MCV 93.1 91.4 94.0  PLT 209 239 226   Lipid Panel:  Recent Labs  11/22/16 1425  CHOL 176  HDL 61  LDLCALC 96  TRIG 93  CHOLHDL 2.9   Lab Results  Component Value Date   HGBA1C 5.5 08/06/2015    Procedures: Dg Chest 2 View  Result Date: 11/02/2016 CLINICAL DATA:  Shortness of breath. EXAM: CHEST  2 VIEW COMPARISON:  CT 08/31/2016 .  Chest x-ray 03/03/2016 . FINDINGS: Surgical clips and sutures noted over the left chest consistent with left upper lobectomy. Stable left pleuroparenchymal thickening consistent with scarring noted. Previously identified right upper lobe process is unchanged and may be related to scarring. Continued follow-up exams can be obtained. Heart size normal. No pleural effusion or pneumothorax. Degenerative changes scoliosis thoracic spine. IMPRESSION: 1. Surgical clips sutures noted over the left chest consist with left upper lobectomy. Stable pleural-parenchymal thickening noted consistent with scarring. 2. Previous identified right upper lobe process is unchanged and may be related scarring. Continued follow-up chest x-rays can be obtained . Electronically Signed   By: Marcello Moores  Register   On: 11/02/2016 17:27   Ct Chest Wo Contrast  Result Date: 11/11/2016 CLINICAL DATA:  71 year old female with history of left-sided lung cancer diagnosed in 1986 status post left upper lobectomy. Right-sided lung cancer than diagnosed in 2012 treated with chemotherapy and radiation therapy. Undergoing ongoing infusions. EXAM: CT CHEST WITHOUT CONTRAST TECHNIQUE: Multidetector CT imaging of the chest was performed following the standard protocol without IV contrast. COMPARISON:  Multiple priors, most recently chest CT 08/31/2016. FINDINGS: Cardiovascular: Heart size is normal. Small amount of pericardial fluid and/or  thickening, similar to the prior study, and unlikely to be of any hemodynamic significance at this time. No associated pericardial calcification. There is aortic atherosclerosis, as well as atherosclerosis of the great vessels of the mediastinum and the coronary arteries, including calcified atherosclerotic plaque in the left anterior descending and left circumflex coronary arteries. Mediastinum/Nodes: No pathologically enlarged mediastinal or hilar lymph nodes. Please note that accurate exclusion of hilar adenopathy is limited on noncontrast CT scans. Large amount of oral contrast material noted in the esophagus related to recent swallow study, compatible with gastroesophageal reflux. Esophagus is otherwise unremarkable in appearance. No axillary lymphadenopathy. Lungs/Pleura: Moderate right pleural effusion lying dependently, similar to the prior study. Complete chronic collapse of the right upper lobe again noted. Chronic scarring and volume loss in the perihilar aspect of the right lower lobe, particularly involving the superior segment. Persistent mass-like area of ground-glass attenuation, septal thickening and some peripheral predominant more confluent appearing areas of consolidation in the right middle lobe best appreciated on axial image 68 of series 5,  measuring 8.7 x 5.7 cm on today's study. This is more prominent than recent prior examinations, and has progressively worsened over the past several years, previously appearing only as a vague area of ground-glass attenuation on 07/21/2012. Although this could represent evolving postradiation changes, the possibility of a slow-growing neoplasm such as an adenocarcinoma warrants consideration. A few other patchy more ill-defined areas of ground-glass attenuation are noted throughout both lungs, nonspecific. Status post left upper lobectomy. Trace left pleural effusion lying dependently. Diffuse bronchial wall thickening with mild to moderate centrilobular  and paraseptal emphysema. Upper Abdomen: Calcified gallstones lying dependently in the gallbladder. No findings to suggest an acute cholecystitis at this time. Musculoskeletal: Chronic compression fractures at T4 and T5 with post vertebroplasty changes at T4 again noted, most severe at T4 where there is approximately 30% loss of central vertebral body height. There are no aggressive appearing lytic or blastic lesions noted in the visualized portions of the skeleton. IMPRESSION: 1. The majority of today's examination is stable compared to prior studies, although there continues to be slow progressive enlargement of a mass-like area of predominantly ground-glass attenuation and septal thickening in the right middle lobe. Given the slow progression compared to prior studies dating back to 2014, the possibility of slow-growing neoplasm such as a primary bronchogenic adenocarcinoma should be considered. Further evaluation with PET-CT is strongly recommended in the near future to better evaluate this finding and identify potential areas of hypermetabolism which may serve as a target for future biopsy. 2. Mild diffuse bronchial wall thickening with mild to moderate centrilobular and paraseptal emphysema; imaging findings suggestive of underlying COPD. 3. Aortic atherosclerosis, in addition to 2 vessel coronary artery disease. Assessment for potential risk factor modification, dietary therapy or pharmacologic therapy may be warranted, if clinically indicated. 4. Oral contrast material in the esophagus from recent swallow study, indicative of gastroesophageal reflux. Aortic Atherosclerosis (ICD10-I70.0) and Emphysema (ICD10-J43.9). Electronically Signed   By: Vinnie Langton M.D.   On: 11/11/2016 09:27   Dg Op Swallowing Func-medicare/speech Path  Result Date: 11/10/2016 Objective Swallowing Evaluation: Type of Study: MBS-Modified Barium Swallow Study Patient Details Name: [name] MRN: [id number] Date of Birth:  [birth year] Today's Date: 11/10/2016 Time: SLP Start Time (ACUTE ONLY): 1310-SLP Stop Time (ACUTE ONLY): 1345 SLP Time Calculation (min) (ACUTE ONLY): 35 min Past Medical History: Past Medical History: Diagnosis Date . Acute posthemorrhagic anemia  . Acute sinusitis, unspecified  . Acute upper respiratory infections of unspecified site  . Adenocarcinoma, lung ([place])   upper left lobe . Anemia in neoplastic disease  . Anemia, unspecified  . Anginal pain ([place])  . Anxiety  . Anxiety state, unspecified  . Arthritis   "all over my body" . Asthma  . Asymptomatic varicose veins  . Atherosclerosis of native arteries of the extremities, unspecified  . Atrial fibrillation ([place])  . Bronchial pneumonia   history . 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 Chemotherapy adverse reaction 09/15/11  "makes me light headed and pass out; this is 3rd blood transfusion since going thru it" . CHF (congestive heart failure) ([place])  . Chronic airway obstruction, not elsewhere classified  . Chronic fatigue 08/19/2015 . Chronic sinusitis  . Complication of anesthesia   slow to wake up . COPD (chronic obstructive pulmonary disease) ([place])  . Coronary artery disease   Dr. Burt Knack had stress test done . DDD (degenerative disc disease), lumbar  . Depressive disorder, not elsewhere classified  . Diseases of lips  . Disorder of bone  and cartilage, unspecified  . Dizziness and giddiness  . DVT (deep venous thrombosis) ([place])  . Dysrhythmia   irregular . Effects of radiation, unspecified  . Emphysema  . Encounter for antineoplastic immunotherapy 08/19/2015 . Fibromyalgia  . Functional urinary incontinence  . GERD (gastroesophageal reflux disease)  . Hard of hearing, right  . Heart murmur  . Herpes zoster without mention of complication  . Hip pain, chronic 09/17/2014 . History of blood clots   "2 in my aorta" . History of blood transfusion  . History of bronchitis  . Hx of radiation therapy 04/28/11 -06/08/11  right upper lobe-lung . Hyperlipidemia  . Hyperpotassemia   . Hypertension  . Hypopotassemia  . Kidney infection   "related to chemo" . Lumbago  . Malignant neoplasm of bronchus and lung, unspecified site  . Mental disorder  . Muscle weakness (generalized)  . Muscle weakness (generalized)  . Non-small cell lung cancer ([place]) 03/31/2011  Adenocarcinoma Rt upper lobe mass . Osteoarthrosis, unspecified whether generalized or localized, unspecified site  . Osteoporosis  . Other abnormal blood chemistry  . Other and unspecified hyperlipidemia  . PAD (peripheral artery disease) ([place])  . Pain in thoracic spine  . Peripheral vascular disease ([place])  . Polyneuropathy in diabetes(357.2)  . Primary cancer of right upper lobe of lung ([place]) 02/08/2011  Biopsy Rt upper lobe mass 03/31/11>>adenocarcinoma DIAGNOSIS: Stage IIB/IIIA non-small cell lung cancer consistent with adenocarcinoma diagnosed in December 2012.   PRIOR THERAPY:  1) Status post radiation therapy to the right upper lobe lung mass completed on 06/04/2011 under the care of Dr. Pablo Ledger.  2) Systemic chemotherapy with carboplatin for AUC of 5 and Alimta 500 mg/M2 giving every 3  . Reflux  . Reflux esophagitis  . Respiratory failure with hypoxia 01/02/2010 . Restless legs syndrome (RLS)  . Scoliosis  . Screening for thyroid disorder  . Shortness of breath   "anytime really" . Sinus headache   "all the time" . Spasm of muscle  . Syncope and collapse  . Type II or unspecified type diabetes mellitus with neurological manifestations, uncontrolled(250.62)  . Type II or unspecified type diabetes mellitus without mention of complication, not stated as uncontrolled  . Type II or unspecified type diabetes mellitus without mention of complication, not stated as uncontrolled  . Unspecified constipation  . Unspecified essential hypertension  . Unspecified hearing loss  . Unspecified hereditary and idiopathic peripheral neuropathy  . Unspecified vitamin D deficiency  . Urinary tract infection, site not specified  Past Surgical History:  Past Surgical History: Procedure Laterality Date . BRONCHOSCOPY  02/2011 . CARDIAC CATHETERIZATION   . CATARACT EXTRACTION  03/12/2013  Left Eye  . DILATION AND CURETTAGE OF UTERUS  1980's . EYE SURGERY Left 2014  Cataract . FETAL BLOOD TRANSFUSION  09/16/11 . JOINT REPLACEMENT  04/13/10  Left Hip . LUNG LOBECTOMY  1986  left . SEPTOPLASTY   . TOTAL HIP ARTHROPLASTY  01/2010  left . TUBAL LIGATION  1980's HPI: 71 yo female referred for OP MBS due to concerns pt may be aspirating.  PMH + for COPD, CHF, GER, stage III A lung cancer diagnosed 2012- had been undergoing chemo recently, XRT 1/16-2/26/13.  Per NP note, pt had been on reglan for gastroparesis but was taken off and concern for thrush/oral candidiasis present with white coating on tongue s/p nystain.  Pt reports now she is back on Reglan and takes Zantac.  She admits to dysphagia x 4-5 months - and report is worse on  infusion txs.  Xerostomia reported as well. Per [facility] note, pt was to see ENT and was advised to take allergy medicine with decongestant to assist with ear pressure.  Pt complains of coughing on secretions even - not associated with intake.  She states foods feel like they slide down the right side of her throat and stick or go down the wrong way.  She at times will cough food back up and expectorate it.  She denies requiring heimlich maneuver and states weight fluctuates.  Pt also reports she has not had pneumonia recently.  Voice may be different at times per pt.  No prior evaluations completed.    Subjective: pt awake in chair Assessment / Plan / Recommendation CHL IP CLINICAL IMPRESSIONS 11/10/2016 Clinical Impression Pt presents with minimal oropharyngeal dysphagia primarily due to decreased oral bolus control, piecemealing and premature spillage into pharynx with decreased timing of laryngeal closure. Laryngeal penetration of thin noted - large amount that cleared 99% with same swallow.  Trace penetrates were cleared with cued throat clear/cough.   Chin tuck posture decreased amount of laryngeal penetration to minimal but pt does report it was not comfortable for her to conduct. Pt's pharyngeal swallow was strong without significant residuals.  Reviewed findings of MBS with pt using video playback and discussed compensation strategies to mitigate her dysphagia symptoms.  Information was provided verbally and in writing.   SLP Visit Diagnosis Dysphagia, oropharyngeal phase (R13.12) Attention and concentration deficit following -- Frontal lobe and executive function deficit following -- Impact on safety and function Mild aspiration risk   No flowsheet data found.  No flowsheet data found. CHL IP DIET RECOMMENDATION 11/10/2016 SLP Diet Recommendations Regular solids;Dysphagia 3 (Mech soft) solids;Thin liquid Liquid Administration via Cup;Straw Medication Administration (with puree if problematic, start and follow with drink Compensations Minimize environmental distractions;Slow rate;Small sips/bites Postural Changes Remain semi-upright after after feeds/meals (Comment);Seated upright at 90 degrees   CHL IP OTHER RECOMMENDATIONS 11/10/2016 Recommended Consults -- Oral Care Recommendations Oral care BID Other Recommendations --   No flowsheet data found.  No flowsheet data found.     CHL IP ORAL PHASE 11/10/2016 Oral Phase Impaired Oral - Pudding Teaspoon -- Oral - Pudding Cup -- Oral - Honey Teaspoon -- Oral - Honey Cup -- Oral - Nectar Teaspoon -- Oral - Nectar Cup Piecemeal swallowing;Premature spillage Oral - Nectar Straw -- Oral - Thin Teaspoon -- Oral - Thin Cup Piecemeal swallowing;Premature spillage Oral - Thin Straw Piecemeal swallowing;Premature spillage Oral - Puree WFL;Piecemeal swallowing Oral - Mech Soft -- Oral - Regular WFL Oral - Multi-Consistency -- Oral - Pill Delayed oral transit;Premature spillage Oral Phase - Comment --  CHL IP PHARYNGEAL PHASE 11/10/2016 Pharyngeal Phase Impaired Pharyngeal- Pudding Teaspoon -- Pharyngeal -- Pharyngeal- Pudding Cup  -- Pharyngeal -- Pharyngeal- Honey Teaspoon -- Pharyngeal -- Pharyngeal- Honey Cup -- Pharyngeal -- Pharyngeal- Nectar Teaspoon -- Pharyngeal -- Pharyngeal- Nectar Cup WFL Pharyngeal -- Pharyngeal- Nectar Straw -- Pharyngeal -- Pharyngeal- Thin Teaspoon -- Pharyngeal -- Pharyngeal- Thin Cup Penetration/Aspiration during swallow Pharyngeal Material enters airway, remains ABOVE vocal cords and not ejected out Pharyngeal- Thin Straw Reduced airway/laryngeal closure;Penetration/Aspiration during swallow;Pharyngeal residue - valleculae;Pharyngeal residue - pyriform Pharyngeal Material enters airway, remains ABOVE vocal cords and not ejected out Pharyngeal- Puree WFL Pharyngeal -- Pharyngeal- Mechanical Soft -- Pharyngeal -- Pharyngeal- Regular WFL Pharyngeal -- Pharyngeal- Multi-consistency -- Pharyngeal -- Pharyngeal- Pill WFL Pharyngeal -- Pharyngeal Comment timing of laryngeal closure impaired with pt piecemealing and premature spillage with oral residuals  spilling into pharynx, chin tuck posture tested and decreased amount of penetration but did not fully clear  CHL IP CERVICAL ESOPHAGEAL PHASE 11/10/2016 Cervical Esophageal Phase WFL Pudding Teaspoon -- Pudding Cup -- Honey Teaspoon -- Honey Cup -- Nectar Teaspoon -- Nectar Cup -- Nectar Straw -- Thin Teaspoon -- Thin Cup -- Thin Straw -- Puree -- Mechanical Soft -- Regular -- Multi-consistency -- Pill -- Cervical Esophageal Comment -- CHL IP GO 11/10/2016 Functional Assessment Tool Used MBS Functional Limitations Swallowing Swallow Current Status (E8315) CI Swallow Goal Status (V7616) CI Swallow Discharge Status (W7371) CI Macario Golds 11/10/2016, 3:30 PM Luanna Salk, MS [facility] SLP [phone]  CLINICAL DATA:  71 year old female with lung cancer, most recently status post immunotherapy. Recent progressive dysphagia. EXAM: MODIFIED BARIUM SWALLOW TECHNIQUE: Different consistencies of barium were administered orally to the patient by the Speech Pathologist. Imaging of  the pharynx was performed in the lateral projection. FLUOROSCOPY TIME:  Fluoroscopy Time:  1 minutes 24 seconds Radiation Exposure Index (if provided by the fluoroscopic device): 3.61my Number of Acquired Spot Images: 0 COMPARISON:  Paranasal sinus CT 11/08/2016. FINDINGS: Thin liquid- small sips from a cup were normal aside from mild premature spill. When drinking from a straw penetration was noted, without aspiration. This was not improved with the chin-tuck maneuver. Nectar thick liquid- normal aside from mild premature spill. Pure- normal aside from mild premature spill. Pure with cracker- within normal limits Barium tablet - the barium tablet was swallowed using thin liquids without difficulty, although penetration of the thin barium liquid occurred. No aspiration occurred. IMPRESSION: Premature spill with intermittent penetration of thin liquids. Please refer to the Speech Pathologists report for complete details and recommendations. Electronically Signed   By: HGenevie AnnM.D.   On: 11/10/2016 14:19   Ct Maxillofacial Ltd Wo Cm  Result Date: 11/08/2016 CLINICAL DATA:  71year old female with recurrent sinusitis symptoms. Right greater than left sinus drainage. Bilateral ear ache for 4-5 months. EXAM: CT PARANASAL SINUS LIMITED WITHOUT CONTRAST TECHNIQUE: Non-contiguous multidetector CT images of the paranasal sinuses were obtained in a single plane without contrast. COMPARISON:  Brain MRI 01/23/2016, and earlier. FINDINGS: Stable visible brain parenchyma. Negative visible noncontrast deep soft tissue spaces of the face, orbit and scalp soft tissues. Visible bilateral tympanic cavities and mastoids appear clear. The visible bilateral paranasal sinuses are stable and well pneumatized - with only an isolated small area of possible mucosal thickening in a hyperplastic posterior right ethmoid air cell (series 3, image 6). Chronic nasal septal perforation or erosion is stable since 2017 (same image). No acute  osseous abnormality identified. IMPRESSION: 1. Negative paranasal sinuses. 2. Visible tympanic cavities and mastoids appear well pneumatized. 3. Chronic anterior nasal septal perforation. Electronically Signed   By: HGenevie AnnM.D.   On: 11/08/2016 15:31    Assessment/Plan 1. Wellness examination Pt with a complex medical history. Following with oncology due to lung cancer of the right upper lobe, Cardiology due to shortness of breath with occasional chest pains, CAD, HTN, and currently undergoing workup for this, pulmonary for COPD.  -The patient was counseled regarding the appropriate use of alcohol, regular self-examination of the breasts on a monthly basis, prevention of dental and periodontal disease, diet, regular sustained exercise for at least 30 minutes 5 times per week,  tobacco use,  and recommended schedule for GI hemoccult testing, colonoscopy, cholesterol, thyroid and diabetes screening. -FL2 form filled out for pt assistance.  -pt plans to move in with daughter to help  take care of her.  -MMSE 26/30, memory decline noted.  - [facility].   2. DM type 2 with diabetic peripheral neuropathy (HCC) Last A1c 5.5, cont on current medication - Hemoglobin A1c - Microalbumin, urine  3. Screening for colorectal cancer - Ambulatory referral to Gastroenterology  4. Skin yeast infection -to keep area clean and dry, to apply powder three times daily until resolves.  - nystatin (MYCOSTATIN/NYSTOP) powder; Apply topically 3 (three) times daily.  Dispense: 15 g; Refill: 0  Next appt: keep appt in September  Jessica K. Harle Battiest  [facility] [phone] 8 am - 5 pm) [phone] (after hours)

## 2016-12-01 NOTE — Patient Instructions (Addendum)
Nystatin powder under breast three times daily  Notify if rash changes or bumps appear   Referral has been placed for Gastroenterology for screening colonoscopy  Make appt for your eyes to be check  Keep follow up as scheduled  Health Maintenance, Female Adopting a healthy lifestyle and getting preventive care can go a long way to promote health and wellness. Talk with your health care provider about what schedule of regular examinations is right for you. This is a good chance for you to check in with your provider about disease prevention and staying healthy. In between checkups, there are plenty of things you can do on your own. Experts have done a lot of research about which lifestyle changes and preventive measures are most likely to keep you healthy. Ask your health care provider for more information. Weight and diet Eat a healthy diet  Be sure to include plenty of vegetables, fruits, low-fat dairy products, and lean protein.  Do not eat a lot of foods high in solid fats, added sugars, or salt.  Get regular exercise. This is one of the most important things you can do for your health. ? Most adults should exercise for at least 150 minutes each week. The exercise should increase your heart rate and make you sweat (moderate-intensity exercise). ? Most adults should also do strengthening exercises at least twice a week. This is in addition to the moderate-intensity exercise.  Maintain a healthy weight  Body mass index (BMI) is a measurement that can be used to identify possible weight problems. It estimates body fat based on height and weight. Your health care provider can help determine your BMI and help you achieve or maintain a healthy weight.  For females 61 years of age and older: ? A BMI below 18.5 is considered underweight. ? A BMI of 18.5 to 24.9 is normal. ? A BMI of 25 to 29.9 is considered overweight. ? A BMI of 30 and above is considered obese.  Watch levels of  cholesterol and blood lipids  You should start having your blood tested for lipids and cholesterol at 73 years of age, then have this test every 5 years.  You may need to have your cholesterol levels checked more often if: ? Your lipid or cholesterol levels are high. ? You are older than 73 years of age. ? You are at high risk for heart disease.  Cancer screening Lung Cancer  Lung cancer screening is recommended for adults 44-42 years old who are at high risk for lung cancer because of a history of smoking.  A yearly low-dose CT scan of the lungs is recommended for people who: ? Currently smoke. ? Have quit within the past 15 years. ? Have at least a 30-pack-year history of smoking. A pack year is smoking an average of one pack of cigarettes a day for 1 year.  Yearly screening should continue until it has been 15 years since you quit.  Yearly screening should stop if you develop a health problem that would prevent you from having lung cancer treatment.  Breast Cancer  Practice breast self-awareness. This means understanding how your breasts normally appear and feel.  It also means doing regular breast self-exams. Let your health care provider know about any changes, no matter how small.  If you are in your 20s or 30s, you should have a clinical breast exam (CBE) by a health care provider every 1-3 years as part of a regular health exam.  If you are 40  or older, have a CBE every year. Also consider having a breast X-ray (mammogram) every year.  If you have a family history of breast cancer, talk to your health care provider about genetic screening.  If you are at high risk for breast cancer, talk to your health care provider about having an MRI and a mammogram every year.  Breast cancer gene (BRCA) assessment is recommended for women who have family members with BRCA-related cancers. BRCA-related cancers include: ? Breast. ? Ovarian. ? Tubal. ? Peritoneal cancers.  Results  of the assessment will determine the need for genetic counseling and BRCA1 and BRCA2 testing.  Cervical Cancer Your health care provider may recommend that you be screened regularly for cancer of the pelvic organs (ovaries, uterus, and vagina). This screening involves a pelvic examination, including checking for microscopic changes to the surface of your cervix (Pap test). You may be encouraged to have this screening done every 3 years, beginning at age 59.  For women ages 38-65, health care providers may recommend pelvic exams and Pap testing every 3 years, or they may recommend the Pap and pelvic exam, combined with testing for human papilloma virus (HPV), every 5 years. Some types of HPV increase your risk of cervical cancer. Testing for HPV may also be done on women of any age with unclear Pap test results.  Other health care providers may not recommend any screening for nonpregnant women who are considered low risk for pelvic cancer and who do not have symptoms. Ask your health care provider if a screening pelvic exam is right for you.  If you have had past treatment for cervical cancer or a condition that could lead to cancer, you need Pap tests and screening for cancer for at least 20 years after your treatment. If Pap tests have been discontinued, your risk factors (such as having a new sexual partner) need to be reassessed to determine if screening should resume. Some women have medical problems that increase the chance of getting cervical cancer. In these cases, your health care provider may recommend more frequent screening and Pap tests.  Colorectal Cancer  This type of cancer can be detected and often prevented.  Routine colorectal cancer screening usually begins at 73 years of age and continues through 73 years of age.  Your health care provider may recommend screening at an earlier age if you have risk factors for colon cancer.  Your health care provider may also recommend using  home test kits to check for hidden blood in the stool.  A small camera at the end of a tube can be used to examine your colon directly (sigmoidoscopy or colonoscopy). This is done to check for the earliest forms of colorectal cancer.  Routine screening usually begins at age 69.  Direct examination of the colon should be repeated every 5-10 years through 73 years of age. However, you may need to be screened more often if early forms of precancerous polyps or small growths are found.  Skin Cancer  Check your skin from head to toe regularly.  Tell your health care provider about any new moles or changes in moles, especially if there is a change in a mole's shape or color.  Also tell your health care provider if you have a mole that is larger than the size of a pencil eraser.  Always use sunscreen. Apply sunscreen liberally and repeatedly throughout the day.  Protect yourself by wearing long sleeves, pants, a wide-brimmed hat, and sunglasses whenever you are  outside.  Heart disease, diabetes, and high blood pressure  High blood pressure causes heart disease and increases the risk of stroke. High blood pressure is more likely to develop in: ? People who have blood pressure in the high end of the normal range (130-139/85-89 mm Hg). ? People who are overweight or obese. ? People who are African American.  If you are 99-66 years of age, have your blood pressure checked every 3-5 years. If you are 68 years of age or older, have your blood pressure checked every year. You should have your blood pressure measured twice-once when you are at a hospital or clinic, and once when you are not at a hospital or clinic. Record the average of the two measurements. To check your blood pressure when you are not at a hospital or clinic, you can use: ? An automated blood pressure machine at a pharmacy. ? A home blood pressure monitor.  If you are between 62 years and 35 years old, ask your health care provider  if you should take aspirin to prevent strokes.  Have regular diabetes screenings. This involves taking a blood sample to check your fasting blood sugar level. ? If you are at a normal weight and have a low risk for diabetes, have this test once every three years after 73 years of age. ? If you are overweight and have a high risk for diabetes, consider being tested at a younger age or more often. Preventing infection Hepatitis B  If you have a higher risk for hepatitis B, you should be screened for this virus. You are considered at high risk for hepatitis B if: ? You were born in a country where hepatitis B is common. Ask your health care provider which countries are considered high risk. ? Your parents were born in a high-risk country, and you have not been immunized against hepatitis B (hepatitis B vaccine). ? You have HIV or AIDS. ? You use needles to inject street drugs. ? You live with someone who has hepatitis B. ? You have had sex with someone who has hepatitis B. ? You get hemodialysis treatment. ? You take certain medicines for conditions, including cancer, organ transplantation, and autoimmune conditions.  Hepatitis C  Blood testing is recommended for: ? Everyone born from [birth year] through 1967. ? Anyone with known risk factors for hepatitis C.  Sexually transmitted infections (STIs)  You should be screened for sexually transmitted infections (STIs) including gonorrhea and chlamydia if: ? You are sexually active and are younger than 73 years of age. ? You are older than 73 years of age and your health care provider tells you that you are at risk for this type of infection. ? Your sexual activity has changed since you were last screened and you are at an increased risk for chlamydia or gonorrhea. Ask your health care provider if you are at risk.  If you do not have HIV, but are at risk, it may be recommended that you take a prescription medicine daily to prevent HIV infection. This  is called pre-exposure prophylaxis (PrEP). You are considered at risk if: ? You are sexually active and do not regularly use condoms or know the HIV status of your partner(s). ? You take drugs by injection. ? You are sexually active with a partner who has HIV.  Talk with your health care provider about whether you are at high risk of being infected with HIV. If you choose to begin PrEP, you should first be tested  for HIV. You should then be tested every 3 months for as long as you are taking PrEP. Pregnancy  If you are premenopausal and you may become pregnant, ask your health care provider about preconception counseling.  If you may become pregnant, take 400 to 800 micrograms (mcg) of folic acid every day.  If you want to prevent pregnancy, talk to your health care provider about birth control (contraception). Osteoporosis and menopause  Osteoporosis is a disease in which the bones lose minerals and strength with aging. This can result in serious bone fractures. Your risk for osteoporosis can be identified using a bone density scan.  If you are 1 years of age or older, or if you are at risk for osteoporosis and fractures, ask your health care provider if you should be screened.  Ask your health care provider whether you should take a calcium or vitamin D supplement to lower your risk for osteoporosis.  Menopause may have certain physical symptoms and risks.  Hormone replacement therapy may reduce some of these symptoms and risks. Talk to your health care provider about whether hormone replacement therapy is right for you. Follow these instructions at home:  Schedule regular health, dental, and eye exams.  Stay current with your immunizations.  Do not use any tobacco products including cigarettes, chewing tobacco, or electronic cigarettes.  If you are pregnant, do not drink alcohol.  If you are breastfeeding, limit how much and how often you drink alcohol.  Limit alcohol intake  to no more than 1 drink per day for nonpregnant women. One drink equals 12 ounces of beer, 5 ounces of wine, or 1 ounces of hard liquor.  Do not use street drugs.  Do not share needles.  Ask your health care provider for help if you need support or information about quitting drugs.  Tell your health care provider if you often feel depressed.  Tell your health care provider if you have ever been abused or do not feel safe at home. This information is not intended to replace advice given to you by your health care provider. Make sure you discuss any questions you have with your health care provider. Document Released: 10/12/2010 Document Revised: 09/04/2015 Document Reviewed: 12/31/2014 Elsevier Interactive Patient Education  [employer].

## 2016-12-01 NOTE — Telephone Encounter (Signed)
Patient was in office today for a yearly exam, Patient presented with form from [place] (Department of Social Services), form indicated we need to complete FL2.  I called contact person Ronie Spies @ [phone] to clarify need for [facility] for patient no longer wishes to go to SNF  Per Angelita Ingles the state requires FL2 for patient getting a certain level of assistance in which Sherrel is receiving. Per Angelita Ingles we can write see attached for medication and problem list, other components to be filled in on form and faxed to [phone]  Form completed and faxed, form placed in my mailbox to confirm receipt

## 2016-12-02 ENCOUNTER — Telehealth: Payer: Self-pay

## 2016-12-02 LAB — HEMOGLOBIN A1C
Hgb A1c MFr Bld: 5.5 % (ref <5.7)
Mean Plasma Glucose: 111 mg/dL

## 2016-12-02 LAB — MICROALBUMIN, URINE: MICROALB UR: 4.4 mg/dL

## 2016-12-02 NOTE — Telephone Encounter (Signed)
Department pf Social Services called about FL2 that was submitted yesterday.   Numbers 10 and Number 11 will not qualify patient for special assistants through medicaid. Patient would have to be listed as domiciliary in both locations.   I called patient, no answer, no voicemail set up. I will try to reach patient again later

## 2016-12-02 NOTE — Telephone Encounter (Signed)
Patient called back, patient states she is SOB all the time and she has an aid that comes out 3 hours daily, and patient falls often.   Patient will be moving in with daughter within the next 3 months for more assistance.   Patient states she will not be able to live without her assistance and would like for [place] to re-evaluate her FL2 and change number 11 (recommended of care) to Domiciliary for Dr.Green completed FL2 previously in this way.  I instructed patient that Janett Billow will update form yet Angelita Ingles with DSS informed me that both 10 and 11 would need to be changed to domiciliary and that we will not be able to do for that would be considered fraud   Form updated and resubmitted

## 2016-12-02 NOTE — Telephone Encounter (Signed)
Spoke with patient, patient states she does not understand why they would want her to been a nursing home and this is a new process and she will contact her social worker first and then follow-up with [place].  Patient will call us with status update

## 2016-12-03 ENCOUNTER — Other Ambulatory Visit: Payer: Self-pay | Admitting: Nurse Practitioner

## 2016-12-03 ENCOUNTER — Other Ambulatory Visit: Payer: Self-pay | Admitting: *Deleted

## 2016-12-03 ENCOUNTER — Encounter: Payer: Self-pay | Admitting: Nurse Practitioner

## 2016-12-03 DIAGNOSIS — R1310 Dysphagia, unspecified: Secondary | ICD-10-CM

## 2016-12-03 MED ORDER — NYSTATIN 100000 UNIT/ML MT SUSP: 5.0000 mL | Freq: Four times a day (QID) | OROMUCOSAL | 1 refills | Status: DC

## 2016-12-06 ENCOUNTER — Other Ambulatory Visit: Payer: Medicare Other

## 2016-12-06 ENCOUNTER — Telehealth: Payer: Self-pay | Admitting: Emergency Medicine

## 2016-12-06 NOTE — Telephone Encounter (Signed)
Spoke with patient. She stated that she feels like she has fluid in her chest. Increased coughing and SOB. This started yesterday. So far the cough has not been productive. She has been using her inhalers and nebulizer without any relief. She wants to know what can she do to get rid of the cough.   She wishes to use [facility] on [facility].   TP, please advise since RB is not in the office. Thanks!

## 2016-12-06 NOTE — Telephone Encounter (Signed)
Prednisone 10 mg take  4 each am x 2 days,   2 each am x 2 days,  1 each am x 2 days and stop  zpak  Ov by end of week if not improving, to ER if worse despite rx

## 2016-12-06 NOTE — Telephone Encounter (Signed)
MW please advise.  Thanks.  

## 2016-12-06 NOTE — Telephone Encounter (Signed)
She should Keep appt to see SG for recheck in addition to below

## 2016-12-06 NOTE — Telephone Encounter (Signed)
Spoke with the pt and notified of recs per MW  She refused the pred and zpack, stating that she can not take any abx or steroids while on "infusions"  She states needing cough syrup  I advised needs ov, and until then can try otc delsym  She verbalized understanding  I offered ov for tomorrow and she refused, stating she was unsure when she would be able to come in  I advised to call back and schedule appt when she can, and to seek emergent care if needed

## 2016-12-07 ENCOUNTER — Encounter: Payer: Self-pay | Admitting: *Deleted

## 2016-12-07 NOTE — Addendum Note (Signed)
Addended by: [name] on: 12/07/2016 01:10 PM   Modules accepted: Level of Service

## 2016-12-09 ENCOUNTER — Encounter: Payer: Self-pay | Admitting: Oncology

## 2016-12-09 ENCOUNTER — Other Ambulatory Visit (HOSPITAL_BASED_OUTPATIENT_CLINIC_OR_DEPARTMENT_OTHER): Payer: Medicare Other

## 2016-12-09 ENCOUNTER — Telehealth: Payer: Self-pay | Admitting: Oncology

## 2016-12-09 ENCOUNTER — Ambulatory Visit (HOSPITAL_BASED_OUTPATIENT_CLINIC_OR_DEPARTMENT_OTHER): Payer: Medicare Other

## 2016-12-09 ENCOUNTER — Other Ambulatory Visit: Payer: Self-pay | Admitting: *Deleted

## 2016-12-09 ENCOUNTER — Ambulatory Visit (HOSPITAL_BASED_OUTPATIENT_CLINIC_OR_DEPARTMENT_OTHER): Payer: Medicare Other | Admitting: Oncology

## 2016-12-09 VITALS — BP 134/77 | HR 89 | Temp 98.4°F | Resp 18

## 2016-12-09 DIAGNOSIS — C3411 Malignant neoplasm of upper lobe, right bronchus or lung: Secondary | ICD-10-CM | POA: Diagnosis not present

## 2016-12-09 DIAGNOSIS — R0602 Shortness of breath: Secondary | ICD-10-CM

## 2016-12-09 DIAGNOSIS — Z5112 Encounter for antineoplastic immunotherapy: Secondary | ICD-10-CM

## 2016-12-09 DIAGNOSIS — J449 Chronic obstructive pulmonary disease, unspecified: Secondary | ICD-10-CM

## 2016-12-09 DIAGNOSIS — R05 Cough: Secondary | ICD-10-CM

## 2016-12-09 DIAGNOSIS — Z9981 Dependence on supplemental oxygen: Secondary | ICD-10-CM | POA: Diagnosis not present

## 2016-12-09 DIAGNOSIS — R5382 Chronic fatigue, unspecified: Secondary | ICD-10-CM

## 2016-12-09 DIAGNOSIS — Z79899 Other long term (current) drug therapy: Secondary | ICD-10-CM

## 2016-12-09 LAB — CBC WITH DIFFERENTIAL/PLATELET
BASO%: 0.7 % (ref 0.0–2.0)
Basophils Absolute: 0.1 10*3/uL (ref 0.0–0.1)
EOS%: 12.9 % — ABNORMAL HIGH (ref 0.0–7.0)
Eosinophils Absolute: 1.2 10*3/uL — ABNORMAL HIGH (ref 0.0–0.5)
HEMATOCRIT: 36.4 % (ref 34.8–46.6)
HEMOGLOBIN: 12.4 g/dL (ref 11.6–15.9)
LYMPH#: 2 10*3/uL (ref 0.9–3.3)
LYMPH%: 21.7 % (ref 14.0–49.7)
MCH: 30.3 pg (ref 25.1–34.0)
MCHC: 34.1 g/dL (ref 31.5–36.0)
MCV: 88.7 fL (ref 79.5–101.0)
MONO#: 0.6 10*3/uL (ref 0.1–0.9)
MONO%: 6.5 % (ref 0.0–14.0)
NEUT%: 58.2 % (ref 38.4–76.8)
NEUTROS ABS: 5.3 10*3/uL (ref 1.5–6.5)
Platelets: 230 10*3/uL (ref 145–400)
RBC: 4.1 10*6/uL (ref 3.70–5.45)
RDW: 12.7 % (ref 11.2–14.5)
WBC: 9.2 10*3/uL (ref 3.9–10.3)

## 2016-12-09 LAB — COMPREHENSIVE METABOLIC PANEL
ALBUMIN: 3.8 g/dL (ref 3.5–5.0)
ALK PHOS: 103 U/L (ref 40–150)
ALT: 7 U/L (ref 0–55)
ANION GAP: 9 meq/L (ref 3–11)
AST: 15 U/L (ref 5–34)
BILIRUBIN TOTAL: 0.38 mg/dL (ref 0.20–1.20)
BUN: 15.7 mg/dL (ref 7.0–26.0)
CALCIUM: 10.3 mg/dL (ref 8.4–10.4)
CHLORIDE: 102 meq/L (ref 98–109)
CO2: 31 mEq/L — ABNORMAL HIGH (ref 22–29)
CREATININE: 1.1 mg/dL (ref 0.6–1.1)
EGFR: 49 mL/min/{1.73_m2} — ABNORMAL LOW (ref >90)
Glucose: 87 mg/dl (ref 70–140)
Potassium: 4.1 mEq/L (ref 3.5–5.1)
Sodium: 141 mEq/L (ref 136–145)
TOTAL PROTEIN: 7 g/dL (ref 6.4–8.3)

## 2016-12-09 LAB — TSH: TSH: 3.353 m[IU]/L (ref 0.308–3.960)

## 2016-12-09 MED ORDER — MAGIC MOUTHWASH: 5.0000 mL | Freq: Four times a day (QID) | ORAL | 0 refills | Status: DC | PRN

## 2016-12-09 MED ORDER — BENZONATATE 100 MG PO CAPS: 100.0000 mg | ORAL_CAPSULE | Freq: Three times a day (TID) | ORAL | 1 refills | Status: DC | PRN

## 2016-12-09 MED ORDER — SODIUM CHLORIDE 0.9 % IV SOLN
Freq: Once | INTRAVENOUS | Status: AC
  Administered 2016-12-09: 15:00:00 via INTRAVENOUS

## 2016-12-09 MED ORDER — UNABLE TO FIND: 0 refills | Status: DC

## 2016-12-09 MED ORDER — NIVOLUMAB CHEMO INJECTION 100 MG/10ML
480.0000 mg | Freq: Once | INTRAVENOUS | Status: AC
  Administered 2016-12-09: 480 mg via INTRAVENOUS
  Filled 2016-12-09: qty 48

## 2016-12-09 NOTE — Patient Instructions (Signed)
[facility] Discharge Instructions for Patients Receiving Chemotherapy  Today you received the following chemotherapy agents nivolumab (Opdivo).  To help prevent nausea and vomiting after your treatment, we encourage you to take your nausea medication as directed  If you develop nausea and vomiting that is not controlled by your nausea medication, call the clinic.   BELOW ARE SYMPTOMS THAT SHOULD BE REPORTED IMMEDIATELY:  *FEVER GREATER THAN 100.5 F  *CHILLS WITH OR WITHOUT FEVER  NAUSEA AND VOMITING THAT IS NOT CONTROLLED WITH YOUR NAUSEA MEDICATION  *UNUSUAL SHORTNESS OF BREATH  *UNUSUAL BRUISING OR BLEEDING  TENDERNESS IN MOUTH AND THROAT WITH OR WITHOUT PRESENCE OF ULCERS  *URINARY PROBLEMS  *BOWEL PROBLEMS  UNUSUAL RASH Items with * indicate a potential emergency and should be followed up as soon as possible.  Feel free to call the clinic you have any questions or concerns. The clinic phone number is [phone].

## 2016-12-09 NOTE — Assessment & Plan Note (Addendum)
This is a very pleasant 71 year old white female with recurrent non-small cell lung cancer, adenocarcinoma that was initially diagnosed as unresectable stage IIA in December 2012 status post systemic chemotherapy as well as immunotherapy. She is currently on treatment with Nivolumab status post 4 cycles of the new regimen. She is tolerating the treatment well with no significant adverse effects. The patient has repeat CT scan of the chest for restaging of her disease. In general, it is very stable compared to the most recent one.  The patient was seen with [name]. The patient to contact our office since last seen inquiry about hospice. Explained to the patient that from a lung cancer perspective she is not eligible for hospice. She seemed to be having more shortness of breath likely due to her COPD. Recommend she talk with her pulmonologist about the appropriateness of hospice.  From our standpoint, recommend that she continue with her immunotherapy. We will change her from every 2 week Nivolumab every four-week Nivolumab. Dose will be 480 mg given every 4 weeks.  A refill of magic mouthwash was given to the patient today.  A prescription for Ladona Ridgel was given to the patient for her cough.  Per her request, a prescription for cranial prosthesis was given.  Follow-up will be in 4 weeks for labs, an office visit, and her next dose of Nivolumab. I recommended for the patient to continue her current treatment with Nivolumab and she will proceed with cycle #5 today. She had a recent scan of the sinuses for questionable sinusitis that was unremarkable. For COPD, the patient will continue with her current treatment and I advised her to keep her follow-up appointment with [name]. I would see her back for follow-up visit in 2 weeks for evaluation before starting cycle #6. She was advised to call immediately if she has any concerning symptoms in the interval.

## 2016-12-09 NOTE — Telephone Encounter (Signed)
Scheduled appt per 8/30 los - Gave patient AVS and calender per los.

## 2016-12-09 NOTE — Progress Notes (Signed)
[facility] Follow up:    [name], NP [address] [place] [id number]   DIAGNOSIS: Cancer Staging Primary cancer of right upper lobe of lung [facility]) Staging form: Lung, AJCC 7th Edition - Clinical: Stage IIB/IIIA - Signed by Curt Bears, MD on 07/26/2013 Recurrent non-small cell lung cancer initially diagnosed as a stage IIA adenocarcinoma in December 2012 with no actionable mutations.   SUMMARY OF ONCOLOGIC HISTORY:   Primary cancer of right upper lobe of lung ([name])   02/08/2011 Initial Diagnosis    Non-small cell lung cancer ([name])      PRIOR THERAPY: 1) Status post radiation therapy to the right upper lobe lung mass completed on 06/04/2011 under the care of Dr. Pablo Ledger.  2) Systemic chemotherapy with carboplatin for AUC of 5 and Alimta 500 mg/M2 giving every 3 weeks, status post 3 cycles, last dose was given on 07/21/2011.  3) Systemic chemotherapy with carboplatin for AUC of 5 and Alimta 500 MG/M2 every 3 weeks. First dose on 01/02/2015. Status post 2 cycles, last dose was given 01/30/2015. Her treatment is currently on hold secondary to intolerance. 4) Immunotherapy with Nivolumab 240 M IV every 2 weeks status post 14 cycles.   CURRENT THERAPY: The patient will resume her treatment again with immunotherapy with Nivolumab 240 mg IV every 2 weeks. First dose 09/15/2016. Status post 6 cycles   INTERVAL HISTORY: [name] 71 y.o. female returns for routine follow-up with her son.The patient is feeling fine today with the exception of shortness of breath which she feels is increasing, a nonproductive cough, and mouth sores. She remains on oxygen 4 L/m. He patient reports that she has been evaluated by cardiology for her shortness of breath and is due to have a cardiac stress test on 12/24/2016.She was seen by ENT for her mouth sores and was told by them that the mouth sores related to her treatment for her lung cancer and that she  should stop. She is currently using viscous lidocaine as requesting a prescription for Magic mouthwash today. The patient expresses interest in proceeding with her immunotherapy. She denies fevers and chills. She does have a nonproductive cough and denies hemoptysis. She was unable to stand to be weighed today but her appetite is stable. Denies nausea, vomiting, diarrhea, constipation. Denies rashes. The patient is here for evaluation prior to cycle 6 of her immunotherapy.   Patient Active Problem List   Diagnosis Date Noted  . CAD (coronary artery disease) 11/30/2016  . Candida infection 07/21/2016  . Benign paroxysmal positional vertigo 01/20/2016  . Encounter for antineoplastic immunotherapy 08/19/2015  . Chronic fatigue 08/19/2015  . Anxiety state 07/29/2015  . Headache, tension type, chronic 09/17/2014  . Hip pain, chronic 09/17/2014  . History of fall 05/21/2014  . Insomnia 01/09/2014  . Gait abnormality 08/16/2013  . DM type 2 with diabetic peripheral neuropathy ([name]) 08/01/2013  . Oropharyngeal dysphagia 08/01/2013  . PUD (peptic ulcer disease) 08/01/2013  . Myalgia and myositis 08/01/2013  . Esophageal reflux 06/27/2013  . Atherosclerosis of native arteries of the extremities with intermittent claudication 10/26/2012  . 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 Restless legs syndrome (RLS)   . GERD (gastroesophageal reflux disease)   . Heart murmur   . History of blood clots   . Syncope and collapse   . History of blood transfusion   . Fibromyalgia   . DDD (degenerative disc disease), lumbar   . Scoliosis   . Mental disorder   .  CHF (congestive heart failure) ([place])   . Hx of radiation therapy   . Back pain 11/26/2011  . Chronic cough 11/26/2011  . SOB (shortness of breath) 09/15/2011  . Weakness generalized 09/15/2011  . Anemia 09/15/2011  . Primary cancer of right upper lobe of lung ([name]) 02/08/2011  . Respiratory failure with hypoxia ([name]) 01/02/2010  . PAD  (peripheral artery disease) ([name]) 08/06/2008  . Dyslipidemia 10/28/2006  . Essential hypertension 10/28/2006  . Sinusitis, chronic 10/28/2006  . COPD (chronic obstructive pulmonary disease) ([place]) 10/28/2006    is allergic to iohexol; other; anoro ellipta [umeclidinium-vilanterol]; codeine; and protonix [pantoprazole sodium].  MEDICAL HISTORY: Past Medical History:  Diagnosis Date  . Acute posthemorrhagic anemia   . Adenocarcinoma, lung ([name])    upper left lobe  . Anemia in neoplastic disease   . Anginal pain ([place])   . Anxiety   . Arthritis    "all over my body"  . Asthma   . Asymptomatic varicose veins   . Atherosclerosis of native arteries of the extremities, unspecified   . Atrial fibrillation ([name])   . Bronchial pneumonia    history  . 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 Chemotherapy adverse reaction 09/15/11   "makes me light headed and pass out; this is 3rd blood transfusion since going thru it"  . CHF (congestive heart failure) ([name])   . Chronic airway obstruction, not elsewhere classified   . Chronic fatigue 08/19/2015  . Chronic sinusitis   . Complication of anesthesia    slow to wake up  . COPD (chronic obstructive pulmonary disease) ([name])   . Coronary artery disease    Dr. Burt Knack had stress test done  . DDD (degenerative disc disease), lumbar   . Depressive disorder, not elsewhere classified   . Diseases of lips   . Disorder of bone and cartilage, unspecified   . Dizziness and giddiness   . DVT (deep venous thrombosis) ([name])   . Dysrhythmia    irregular  . Effects of radiation, unspecified   . Emphysema   . Encounter for antineoplastic immunotherapy 08/19/2015  . Fibromyalgia   . Functional urinary incontinence   . GERD (gastroesophageal reflux disease)   . Hard of hearing, right   . Heart murmur   . Herpes zoster without mention of complication   . Hip pain, chronic 09/17/2014  . History of blood clots    "2 in my aorta"  . History of blood  transfusion   . History of bronchitis   . Hx of radiation therapy 04/28/11 -06/08/11   right upper lobe-lung  . Hyperlipidemia   . Hyperpotassemia   . Hypertension   . Hypopotassemia   . Kidney infection    "related to chemo"  . Lumbago   . Malignant neoplasm of bronchus and lung, unspecified site   . Mental disorder   . Muscle weakness (generalized)   . Muscle weakness (generalized)   . Non-small cell lung cancer ([place]) 03/31/2011   Adenocarcinoma Rt upper lobe mass  . Osteoarthrosis, unspecified whether generalized or localized, unspecified site   . Osteoporosis   . Other abnormal blood chemistry   . Other and unspecified hyperlipidemia   . PAD (peripheral artery disease) ([place])   . Pain in thoracic spine   . Peripheral vascular disease ([place])   . 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 Primary cancer of right upper lobe of lung ([place]) 02/08/2011   Biopsy Rt upper lobe mass 03/31/11>>adenocarcinoma DIAGNOSIS: Stage IIB/IIIA non-small cell  lung cancer consistent with adenocarcinoma diagnosed in December 2012.   PRIOR THERAPY:  1) Status post radiation therapy to the right upper lobe lung mass completed on 06/04/2011 under the care of Dr. Pablo Ledger.  2) Systemic chemotherapy with carboplatin for AUC of 5 and Alimta 500 mg/M2 giving every 3   . Reflux   . Reflux esophagitis   . Respiratory failure with hypoxia 01/02/2010  . Restless legs syndrome (RLS)   . Scoliosis   . Screening for thyroid disorder   . Shortness of breath    "anytime really"  . Sinus headache    "all the time"  . Spasm of muscle   . Syncope and collapse   . Type II or unspecified type diabetes mellitus with neurological manifestations, uncontrolled(250.62)   . Type II or unspecified type diabetes mellitus without mention of complication, not stated as uncontrolled   . Type II or unspecified type diabetes mellitus without mention of complication, not stated as uncontrolled   . Unspecified constipation   .  Unspecified essential hypertension   . Unspecified hearing loss   . Unspecified hereditary and idiopathic peripheral neuropathy   . Unspecified vitamin D deficiency   . Urinary tract infection, site not specified     SURGICAL HISTORY: Past Surgical History:  Procedure Laterality Date  . BRONCHOSCOPY  02/2011  . CARDIAC CATHETERIZATION    . CATARACT EXTRACTION  03/12/2013   Left Eye   . DILATION AND CURETTAGE OF UTERUS  1980's  . EYE SURGERY Left 2014   Cataract  . FETAL BLOOD TRANSFUSION  09/16/11  . JOINT REPLACEMENT  04/13/10   Left Hip  . LUNG LOBECTOMY  1986   left  . SEPTOPLASTY    . TOTAL HIP ARTHROPLASTY  01/2010   left  . TUBAL LIGATION  1980's    SOCIAL HISTORY: Social History   Social History  . Marital status: Divorced    Spouse name: N/A  . Number of children: 3  . Years of education: N/A   Occupational History  . disabled   . RETIRED Retired   Social History Main Topics  . Smoking status: Former Smoker    Packs/day: 1.00    Years: 40.00    Types: Cigarettes    Quit date: 02/10/1998  . Smokeless tobacco: Never Used  . Alcohol use No  . Drug use: No  . Sexual activity: No   Other Topics Concern  . Not on file   Social History Narrative  . No narrative on file    FAMILY HISTORY: Family History  Problem Relation Age of Onset  . Bone cancer Father   . Cancer Father        Bone  . Diabetes Mother   . Deep vein thrombosis Mother   . Heart disease Mother        Heart Disease before age 57-  PVD  . Hyperlipidemia Mother   . Hypertension Mother   . Varicose Veins Mother   . Arthritis Daughter     Review of Systems  Constitutional: Positive for fatigue. Negative for appetite change, chills and fever.  HENT:         Reports mouth sores  Eyes: Negative.   Respiratory: Positive for cough and shortness of breath. Negative for hemoptysis and wheezing.   Cardiovascular: Negative.   Gastrointestinal: Negative.   Genitourinary: Negative.     Musculoskeletal: Negative.   Skin: Negative.   Neurological: Negative.   Hematological: Negative.   Psychiatric/Behavioral: Negative.  PHYSICAL EXAMINATION  ECOG PERFORMANCE STATUS: 2 - Symptomatic, <50% confined to bed  Vitals:   12/09/16 1213  BP: 134/77  Pulse: 89  Resp: 18  Temp: 98.4 F (36.9 C)  SpO2: 100%    Physical Exam  Constitutional: She is oriented to person, place, and time and well-developed, well-nourished, and in no distress. No distress.  HENT:  Head: Normocephalic.  Mouth/Throat: Oropharynx is clear and moist. No oropharyngeal exudate.  No mucositis  Eyes: Conjunctivae are normal. Right eye exhibits no discharge. Left eye exhibits no discharge. No scleral icterus.  Neck: Normal range of motion. Neck supple.  Cardiovascular: Normal rate, regular rhythm, normal heart sounds and intact distal pulses.   Pulmonary/Chest: Effort normal. No stridor. No respiratory distress. She has no wheezes. She has no rales.  Diminished breath sounds bilaterally.  Abdominal: Soft. Bowel sounds are normal. She exhibits no distension and no mass. There is no tenderness.  Musculoskeletal: Normal range of motion. She exhibits no edema.  Lymphadenopathy:    She has no cervical adenopathy.  Neurological: She is alert and oriented to person, place, and time. She exhibits normal muscle tone.  Skin: Skin is warm and dry. No rash noted. She is not diaphoretic. No erythema. No pallor.  Psychiatric: Mood, memory, affect and judgment normal.  Vitals reviewed.   LABORATORY DATA:  CBC    Component Value Date/Time   WBC 9.2 12/09/2016 1130   WBC 9.2 03/08/2016 1658   RBC 4.10 12/09/2016 1130   RBC 3.68 (L) 03/08/2016 1658   HGB 12.4 12/09/2016 1130   HCT 36.4 12/09/2016 1130   PLT 230 12/09/2016 1130   MCV 88.7 12/09/2016 1130   MCH 30.3 12/09/2016 1130   MCH 31.0 03/08/2016 1658   MCHC 34.1 12/09/2016 1130   MCHC 34.1 03/08/2016 1658   RDW 12.7 12/09/2016 1130    LYMPHSABS 2.0 12/09/2016 1130   MONOABS 0.6 12/09/2016 1130   EOSABS 1.2 (H) 12/09/2016 1130   EOSABS 0.4 08/06/2015 1036   BASOSABS 0.1 12/09/2016 1130    CMP     Component Value Date/Time   NA 141 12/09/2016 1130   K 4.1 12/09/2016 1130   CL 99 11/30/2016 1627   CL 99 07/21/2012 1125   CO2 31 (H) 12/09/2016 1130   GLUCOSE 87 12/09/2016 1130   GLUCOSE 184 (H) 07/21/2012 1125   BUN 15.7 12/09/2016 1130   CREATININE 1.1 12/09/2016 1130   CALCIUM 10.3 12/09/2016 1130   PROT 7.0 12/09/2016 1130   ALBUMIN 3.8 12/09/2016 1130   AST 15 12/09/2016 1130   ALT 7 12/09/2016 1130   ALKPHOS 103 12/09/2016 1130   BILITOT 0.38 12/09/2016 1130   GFRNONAA 53 (L) 11/30/2016 1627   GFRAA 61 11/30/2016 1627   RADIOGRAPHIC STUDIES:  No results found.  ASSESSMENT and THERAPY PLAN:   Primary cancer of right upper lobe of lung [facility]) This is a very pleasant 71 year old white female with recurrent non-small cell lung cancer, adenocarcinoma that was initially diagnosed as unresectable stage IIA in December 2012 status post systemic chemotherapy as well as immunotherapy. She is currently on treatment with Nivolumab status post 4 cycles of the new regimen. She is tolerating the treatment well with no significant adverse effects. The patient has repeat CT scan of the chest for restaging of her disease. In general, it is very stable compared to the most recent one.  The patient was seen with [name]. The patient to contact our office since last seen inquiry  about hospice. Explained to the patient that from a lung cancer perspective she is not eligible for hospice. She seemed to be having more shortness of breath likely due to her COPD. Recommend she talk with her pulmonologist about the appropriateness of hospice.  From our standpoint, recommend that she continue with her immunotherapy. We will change her from every 2 week Nivolumab every four-week Nivolumab. Dose will be 480 mg given every 4  weeks.  A refill of magic mouthwash was given to the patient today.  A prescription for Ladona Ridgel was given to the patient for her cough.  Per her request, a prescription for cranial prosthesis was given.  Follow-up will be in 4 weeks for labs, an office visit, and her next dose of Nivolumab. I recommended for the patient to continue her current treatment with Nivolumab and she will proceed with cycle #5 today. She had a recent scan of the sinuses for questionable sinusitis that was unremarkable. For COPD, the patient will continue with her current treatment and I advised her to keep her follow-up appointment with [name]. I would see her back for follow-up visit in 2 weeks for evaluation before starting cycle #6. She was advised to call immediately if she has any concerning symptoms in the interval.   Orders Placed This Encounter  Procedures  . CBC with Differential/Platelet    Standing Status:   Standing    Number of Occurrences:   10    Standing Expiration Date:   12/09/2017  . Comprehensive metabolic panel    Standing Status:   Standing    Number of Occurrences:   10    Standing Expiration Date:   12/09/2017    All questions were answered. The patient knows to call the clinic with any problems, questions or concerns. We can certainly see the patient much sooner if necessary.  Mikey Bussing, NP 12/09/2016   ADDENDUM: Hematology/Oncology Attending: I had a face to face encounter with the patient. I recommended her care plan. This is a very pleasant 71 years old white female with recurrent non-small cell lung cancer, adenocarcinoma was currently undergoing treatment with immunotherapy with Nivolumab and has been tolerating the treatment fairly well. The patient also has significant COPD and she is followed by [name]. She was seen recently by her primary care physician. They recommended hospice referral for her. I had a lengthy discussion with the patient and her son today  about her condition. She understands that she has severe COPD and most likely she may die from this condition before her lung cancer. She is not eligible for hospice based on her lung cancer only at this point as her disease has been very well controlled with the current treatment but definitely she is a good candidate for hospice based on her COPD but this would need to be discussed further with her pulmonologist [name]. I recommended for the patient to continue her current treatment with Nivolumab for now. We will change her dose and frequency to be given 480 mg IV every 4 weeks starting from today for convenience. The patient would come back for follow-up visit in 4 weeks for evaluation before the next dose of her treatment. She was advised to call immediately if she has any concerning symptoms in the interval. Disclaimer: This note was dictated with voice recognition software. Similar sounding words can inadvertently be transcribed and may be missed upon review. Eilleen Kempf, MD 12/10/16

## 2016-12-09 NOTE — Progress Notes (Signed)
Patient on plan of care prior to pathways. 

## 2016-12-10 ENCOUNTER — Other Ambulatory Visit: Payer: Self-pay | Admitting: *Deleted

## 2016-12-10 DIAGNOSIS — M25552 Pain in left hip: Secondary | ICD-10-CM

## 2016-12-10 DIAGNOSIS — G44229 Chronic tension-type headache, not intractable: Secondary | ICD-10-CM

## 2016-12-10 DIAGNOSIS — G8929 Other chronic pain: Secondary | ICD-10-CM

## 2016-12-10 DIAGNOSIS — M5136 Other intervertebral disc degeneration, lumbar region: Secondary | ICD-10-CM

## 2016-12-10 MED ORDER — HYDROCODONE-ACETAMINOPHEN 10-325 MG PO TABS: ORAL_TABLET | ORAL | 0 refills | Status: DC

## 2016-12-10 NOTE — Telephone Encounter (Signed)
Patient requested and will pick up NCCSRS Database Checked.  

## 2016-12-14 ENCOUNTER — Other Ambulatory Visit: Payer: Self-pay | Admitting: Internal Medicine

## 2016-12-21 ENCOUNTER — Telehealth: Payer: Self-pay | Admitting: Emergency Medicine

## 2016-12-21 ENCOUNTER — Other Ambulatory Visit: Payer: Self-pay | Admitting: Internal Medicine

## 2016-12-21 ENCOUNTER — Other Ambulatory Visit: Payer: Self-pay | Admitting: Nurse Practitioner

## 2016-12-21 NOTE — Telephone Encounter (Signed)
Pt returned call from [place].  [phone]

## 2016-12-21 NOTE — Telephone Encounter (Signed)
Attempted to call the pt but the VM has not been set up.

## 2016-12-21 NOTE — Telephone Encounter (Signed)
1 of 2 messages created....one call with two separate issues.

## 2016-12-21 NOTE — Telephone Encounter (Signed)
Spoke with patient. She stated that her mouth has been raw and sore for the past few days. She also has a productive cough with green mucus. She can not eat due to the pain. She wants to know what RB could recommend for her.   She wishes to use [facility] on [facility] [address].   RB, please advise. Thanks!

## 2016-12-21 NOTE — Telephone Encounter (Signed)
Spoke with patient. She stated that she will need a new DME for her O2. Her insurance will no longer pay for oxygen from [facility]. Advised patient that we could switch DMEs for her, but it is our policy to have her come in and requalify for oxygen. She has an appt with RB for later this month and will address the issue then.   Nothing else needed at time of call.

## 2016-12-21 NOTE — Telephone Encounter (Signed)
One of two messages put back for this patient....two separate issues.

## 2016-12-21 NOTE — Telephone Encounter (Signed)
Scheduled with SG tomorrow at 345

## 2016-12-21 NOTE — Telephone Encounter (Signed)
Attempted to call the pt and the VM has not been set up.

## 2016-12-21 NOTE — Telephone Encounter (Signed)
No sure how to assess her mouth soreness. Can she be seen so we can examine her?

## 2016-12-22 ENCOUNTER — Ambulatory Visit: Payer: Medicare Other | Admitting: Acute Care

## 2016-12-23 ENCOUNTER — Ambulatory Visit: Payer: Medicare Other

## 2016-12-23 ENCOUNTER — Ambulatory Visit: Payer: Medicare Other | Admitting: Oncology

## 2016-12-23 ENCOUNTER — Other Ambulatory Visit: Payer: Medicare Other

## 2016-12-24 ENCOUNTER — Other Ambulatory Visit (HOSPITAL_COMMUNITY): Payer: Medicare Other

## 2016-12-24 ENCOUNTER — Encounter (HOSPITAL_COMMUNITY): Payer: Medicare Other

## 2016-12-27 ENCOUNTER — Ambulatory Visit: Payer: Medicare Other | Admitting: Physician Assistant

## 2016-12-28 ENCOUNTER — Telehealth: Payer: Self-pay | Admitting: Emergency Medicine

## 2016-12-28 ENCOUNTER — Ambulatory Visit: Payer: Medicare Other | Admitting: Nurse Practitioner

## 2016-12-28 ENCOUNTER — Encounter: Payer: Self-pay | Admitting: Nurse Practitioner

## 2016-12-28 NOTE — Telephone Encounter (Signed)
Spoke with pt, she states [employer] is going to contact us about her oxygen. [employer] didn't contract with Medicare and she has to return the equipment back and needs a new prescription for a new POC. I advised pt that we can do her qualifying sats and provide a new prescription. She has an appt on 9/24. Rout to [place] for [employer].

## 2016-12-29 NOTE — Telephone Encounter (Signed)
I have placed a note on appointment information that pt will need a qualifying walk at her upcoming appointment. I will be out of the office on 01/03/17.

## 2017-01-03 ENCOUNTER — Encounter: Payer: Self-pay | Admitting: Emergency Medicine

## 2017-01-03 ENCOUNTER — Ambulatory Visit (INDEPENDENT_AMBULATORY_CARE_PROVIDER_SITE_OTHER)
Admission: RE | Admit: 2017-01-03 | Discharge: 2017-01-03 | Disposition: A | Discharge status: Home / Self Care | Payer: Medicare Other | Source: Ambulatory Visit | Attending: Emergency Medicine | Admitting: Emergency Medicine

## 2017-01-03 ENCOUNTER — Ambulatory Visit (INDEPENDENT_AMBULATORY_CARE_PROVIDER_SITE_OTHER): Payer: Medicare Other | Admitting: Emergency Medicine

## 2017-01-03 VITALS — BP 148/68 | HR 104 | Ht 65.0 in | Wt 172.0 lb

## 2017-01-03 DIAGNOSIS — R05 Cough: Secondary | ICD-10-CM | POA: Diagnosis not present

## 2017-01-03 DIAGNOSIS — J189 Pneumonia, unspecified organism: Secondary | ICD-10-CM

## 2017-01-03 DIAGNOSIS — I779 Disorder of arteries and arterioles, unspecified: Secondary | ICD-10-CM | POA: Diagnosis not present

## 2017-01-03 DIAGNOSIS — C3411 Malignant neoplasm of upper lobe, right bronchus or lung: Secondary | ICD-10-CM | POA: Diagnosis not present

## 2017-01-03 DIAGNOSIS — R0602 Shortness of breath: Secondary | ICD-10-CM | POA: Diagnosis not present

## 2017-01-03 MED ORDER — PREDNISONE 10 MG PO TABS
ORAL_TABLET | ORAL | 0 refills | Status: DC
Start: 2017-01-03 — End: 2017-01-20

## 2017-01-03 MED ORDER — LEVOFLOXACIN 500 MG PO TABS: 500.0000 mg | ORAL_TABLET | Freq: Every day | ORAL | 0 refills | Status: DC

## 2017-01-03 NOTE — Assessment & Plan Note (Signed)
She needs recertification and oxygen titration today.

## 2017-01-03 NOTE — Progress Notes (Signed)
Subjective:    Patient ID: [name], female    DOB: May 06, 1945, 71 y.o.   MRN: [id number] HPI History of Present Illness:   ROV 08/05/16 -- This is a follow-up visit for severe COPD. 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 She has an enlarging right upper lobe groundglass area and has been in consultation with Dr. [name] regarding timing of reinitiation of her immunotherapy. She is currently managed on combivent, flonase. She reports that she had increase dyspnea last week, some increased wheeze. We treated her with a course of prednisone, finished it last Saturday. She has rebounded some this week. She is taking zyrtec, flonase nasal spray.   ROV 11/02/16 -- [name] has a history of severe COPD, stage IIIa adenocarcinoma the right upper lobe. She had chemoradiation and then was treated with immunotherapy. This was stopped for a period of time in the setting of worsening symptoms felt to be either bronchitic/pneumonia and bronchospasm. Her Nivolumab was restarted in June. She tells me that she is experiencing progressive functional decline, more dyspnea. She feels that she is "smothering". She is using combivent qid, albuterol about 2x a day. Her O2 is at 3L/min. She is due for a repeat Ct chest on 11/10/16. She does not have much cough. She does have wheezing. She is having a lot of nasal drainage. She changed loratadine to allegra. She uses flonase prn.   ROV 01/03/17 -- This follow-up visit for patient with a history of severe COPD, stage IIIa adenocarcinoma the right upper lobe. Most recent CT scan of the chest 11/11/16 that I have reviewed. This shows large scale stability but a slow progressive enlargement of a masslike predominantly groundglass attenuation and septal thickening of the right middle lobe. She remains on immunotherapy. She reports a couple of episodes of being awakened with acute dyspnea. She is having more cough for the last 2 weeks.  Productive of green. She is having from L back pain. She has wheeze through the day. She has been followed by Dr Burt Knack with Cardiology, was to have a stress test but she did not attend.       Objective:   Physical Exam Vitals:   01/03/17 1606  BP: (!) 148/68  Pulse: (!) 104  SpO2: 94%  Weight: 172 lb (78 kg)  Height: 5\' 5"  (1.651 m)   Gen: Pleasant, elderly woman, in no distress,  normal affect  ENT: No lesions,  mouth clear,  oropharynx clear, no postnasal drip  Neck: No JVD, no TMG, no carotid bruits  Lungs: No use of accessory muscles, distant, no wheeze  Cardiovascular: RRR, heart sounds normal, no murmur or gallops, no peripheral edema  Musculoskeletal: No deformities, no cyanosis or clubbing.   Neuro: alert, non focal  Skin: Warm, no lesions or rashes   CBC Latest Ref Rng & Units 12/09/2016 11/11/2016 10/28/2016  WBC 3.9 - 10.3 10e3/uL 9.2 7.7 8.2  Hemoglobin 11.6 - 15.9 g/dL 12.4 11.1(L) 12.6  Hematocrit 34.8 - 46.6 % 36.4 34.6(L) 37.8  Platelets 145 - 400 10e3/uL 230 226 239   BMET    Component Value Date/Time   NA 141 12/09/2016 1130   K 4.1 12/09/2016 1130   CL 99 11/30/2016 1627   CL 99 07/21/2012 1125   CO2 31 (H) 12/09/2016 1130   GLUCOSE 87 12/09/2016 1130   GLUCOSE 184 (H) 07/21/2012 1125   BUN 15.7 12/09/2016 1130   CREATININE 1.1 12/09/2016 1130   CALCIUM  10.3 12/09/2016 1130   GFRNONAA 53 (L) 11/30/2016 1627   GFRAA 61 11/30/2016 1627        Assessment & Plan:  COPD (chronic obstructive pulmonary disease) ([place]) Based on her description I suspect that she has bronchitis versus pneumonia. She has minimal wheeze today. He is to be treated for an acute exacerbation. Her immunotherapy will need to be postponed until after treatment. Otherwise I will continue her current bronchodilator regimen.  Primary cancer of right upper lobe of lung ([place]) Ground Glass lesion in the right lung slowly increasing in size. Plan has been to continue  immunotherapy.  Respiratory failure with hypoxia She needs recertification and oxygen titration today.  Baltazar Apo, MD, PhD 01/03/2017, 4:42 PM [facility] [phone] or if no answer [phone]

## 2017-01-03 NOTE — Patient Instructions (Signed)
Please take levaquin 500mg  daily for 7 days Take prednisone as directed until completely gone.  Please continue your inhaled medications as you are taking them  We will re-certify your oxygen today.  Follow with Dr Julien Nordmann as planned Follow with Dr Lamonte Sakai in 3 months or sooner if you have any problems.

## 2017-01-03 NOTE — Assessment & Plan Note (Signed)
Based on her description I suspect that she has bronchitis versus pneumonia. She has minimal wheeze today. He is to be treated for an acute exacerbation. Her immunotherapy will need to be postponed until after treatment. Otherwise I will continue her current bronchodilator regimen.

## 2017-01-03 NOTE — Assessment & Plan Note (Addendum)
Ground Glass lesion in the right lung slowly increasing in size. Plan has been to continue immunotherapy.

## 2017-01-04 ENCOUNTER — Telehealth: Payer: Self-pay

## 2017-01-04 ENCOUNTER — Telehealth: Payer: Self-pay | Admitting: Medical Oncology

## 2017-01-04 NOTE — Telephone Encounter (Signed)
Pt called that she saw Dr Lamonte Sakai yesterday. (Notes available in epic)  She has pneumonia. She is weak and worn down. She is now on prednisone taper for 9 days and antibiotic levaquin for 7 days. She is cancelling her appt tomorrow.    She is asking if Dr Julien Nordmann can make her chemo 2x/mo because the 1x/mo is too strong.   Next appt currently 10/25 lab/ kristin/ inf

## 2017-01-04 NOTE — Telephone Encounter (Signed)
She cancelled appts for tomorrow because she is " sick ,on steroids and an antibiotic for 10 days".  Wants to change nivo back to q 2 weeks.S because the monthly dose is making her sick. Next appt is 10/25.

## 2017-01-05 ENCOUNTER — Telehealth: Payer: Self-pay | Admitting: *Deleted

## 2017-01-05 ENCOUNTER — Ambulatory Visit (INDEPENDENT_AMBULATORY_CARE_PROVIDER_SITE_OTHER): Payer: Medicare Other | Admitting: *Deleted

## 2017-01-05 ENCOUNTER — Other Ambulatory Visit: Payer: Medicare Other

## 2017-01-05 ENCOUNTER — Ambulatory Visit: Payer: Medicare Other | Admitting: Oncology

## 2017-01-05 ENCOUNTER — Ambulatory Visit: Payer: Medicare Other

## 2017-01-05 DIAGNOSIS — J449 Chronic obstructive pulmonary disease, unspecified: Secondary | ICD-10-CM | POA: Diagnosis not present

## 2017-01-05 NOTE — Telephone Encounter (Signed)
Pt.notified

## 2017-01-05 NOTE — Progress Notes (Signed)
SIX MIN WALK 01/03/2017 02/08/2011  Supplimental Oxygen during Test? (L/min) No -  Tech Comments: - Pt could only do 2 laps due t be very SOB/ms

## 2017-01-05 NOTE — Telephone Encounter (Signed)
Patient notified. Left detailed message.

## 2017-01-05 NOTE — Telephone Encounter (Signed)
No

## 2017-01-05 NOTE — Telephone Encounter (Signed)
Patient called and stated that she wants something called in for her Shoulder and hip pain. Stated that she wants something for inflammation. Stated that she doesn't want to have to take pain medication all the time. Patient stated that she cannot come in to be seen because she already has too many OV visits and her insurance is fussing. Wants something called in to [facility]

## 2017-01-05 NOTE — Telephone Encounter (Signed)
She is on norco, tramadol and prednisone already.

## 2017-01-06 ENCOUNTER — Telehealth: Payer: Self-pay

## 2017-01-06 NOTE — Telephone Encounter (Signed)
Patient called requesting a medication for inflammation. Patient states she is aware that she has pain pills yet she does not want to grab a pain pill everytime she has a problem.   Patient states she would rather have a pain pill discontinued (Ultram) and have something for inflammation.   Patient stated " I have cancer and I am trying to live."   Patient would like for Dr.Reed to reconsider prescribing her something for inflammation    Please advise

## 2017-01-06 NOTE — Telephone Encounter (Signed)
Prednisone is helpful for inflammation which she is already taking.  Other anti-inflammatories can be taken over the counter like ibuprofen or aleve.  She should definitely take these with food.  The combination of anti-inflammatories and prednisone can increase her risk of stomach bleeding.  She already has several stomach related complaints which is why I did not originally recommend another medication.  If she would still like to take aleve for comfort, I suggest one over the counter aleve twice a day with food.

## 2017-01-07 ENCOUNTER — Telehealth: Payer: Self-pay | Admitting: Emergency Medicine

## 2017-01-07 ENCOUNTER — Other Ambulatory Visit: Payer: Self-pay

## 2017-01-07 DIAGNOSIS — J449 Chronic obstructive pulmonary disease, unspecified: Secondary | ICD-10-CM

## 2017-01-07 DIAGNOSIS — M5136 Other intervertebral disc degeneration, lumbar region: Secondary | ICD-10-CM

## 2017-01-07 DIAGNOSIS — G8929 Other chronic pain: Secondary | ICD-10-CM

## 2017-01-07 DIAGNOSIS — M25552 Pain in left hip: Secondary | ICD-10-CM

## 2017-01-07 DIAGNOSIS — G44229 Chronic tension-type headache, not intractable: Secondary | ICD-10-CM

## 2017-01-07 MED ORDER — HYDROCODONE-ACETAMINOPHEN 10-325 MG PO TABS: ORAL_TABLET | ORAL | 0 refills | Status: DC

## 2017-01-07 NOTE — Telephone Encounter (Signed)
Prescription for hydrocodone/apap 10-325 mg printed for #120 with no refills. Rx placed in Dr. [place] folder for signing.

## 2017-01-07 NOTE — Telephone Encounter (Signed)
Spoke with patient , patient verbalized understanding of Dr.Reed's response. Patient states she might consider aleve, it depends on how she feels

## 2017-01-07 NOTE — Telephone Encounter (Signed)
Spoke with pt, she did the walk here last week, she states Medicare is not going to pay for the [employer]. [phone]

## 2017-01-07 NOTE — Telephone Encounter (Signed)
Spoke with pt, advised order was placed for [place] for O2. Pt understood and nothing further is needed.

## 2017-01-10 ENCOUNTER — Telehealth: Payer: Self-pay | Admitting: Emergency Medicine

## 2017-01-10 DIAGNOSIS — J189 Pneumonia, unspecified organism: Secondary | ICD-10-CM

## 2017-01-10 MED ORDER — LEVOFLOXACIN 500 MG PO TABS
500.0000 mg | ORAL_TABLET | Freq: Every day | ORAL | 0 refills | Status: DC
Start: 2017-01-10 — End: 2017-02-03

## 2017-01-10 NOTE — Telephone Encounter (Signed)
Spoke with pt. States that she is not feeling well. Reports chest congestion and cough. Denies chest tightness, fever, SOB or wheezing. Pt was given Levaquin 500mg  at her last OV on 01/03/17. She is requesting a refill on this.  RA - please advise as RB is not available. Thanks!

## 2017-01-10 NOTE — Telephone Encounter (Signed)
Sent another order answering the oxygen conserving device. Gilda advised. PCC's FYI.

## 2017-01-10 NOTE — Telephone Encounter (Signed)
Okay to give 5 more days of Levaquin

## 2017-01-10 NOTE — Telephone Encounter (Signed)
I have sent message to [place] at [facility] notifying them we have an order for them.  Nothing further needed.

## 2017-01-10 NOTE — Telephone Encounter (Signed)
rx sent to preferred pharmacy.  Pt aware.  Nothing further needed.  

## 2017-01-12 ENCOUNTER — Telehealth: Payer: Self-pay | Admitting: Cardiovascular Disease

## 2017-01-12 ENCOUNTER — Telehealth: Payer: Self-pay | Admitting: Emergency Medicine

## 2017-01-12 NOTE — Telephone Encounter (Signed)
°  New Prob  Pt requesting to speak to someone regarding her blockages and if she needs to be on medication at this time. Please call.

## 2017-01-12 NOTE — Telephone Encounter (Signed)
Called and spoke with [name] and she is aware of the 6 min walk that has been faxed to her. Nothing further is needed.

## 2017-01-12 NOTE — Telephone Encounter (Signed)
Patient states she cancelled her stress test, ECHO and follow-up appointment because she did not feel well secondary to cancer treatments.  She states she is still SOB.  Offered to schedule ECHO and stress test. She declined stress test at this time, but agrees to have ECHO scheduled. She refused to schedule until November.  ECHO scheduled November 9 per patient request. She understands to call if SOB worsens prior to that time. She was grateful for call and agrees with treatment plan.

## 2017-01-13 ENCOUNTER — Telehealth: Payer: Self-pay | Admitting: Emergency Medicine

## 2017-01-13 NOTE — Telephone Encounter (Signed)
Spoke with Caryl Pina at [facility], states that [facility] note needs to be updated to reflect O2 saturations from most recent 70mw.  This has been updated.  Nothing further needed.

## 2017-01-18 ENCOUNTER — Other Ambulatory Visit: Payer: Self-pay | Admitting: Nurse Practitioner

## 2017-01-18 ENCOUNTER — Other Ambulatory Visit: Payer: Self-pay | Admitting: Internal Medicine

## 2017-01-19 ENCOUNTER — Other Ambulatory Visit: Payer: Self-pay | Admitting: *Deleted

## 2017-01-19 DIAGNOSIS — M542 Cervicalgia: Secondary | ICD-10-CM

## 2017-01-19 MED ORDER — TRAMADOL HCL 50 MG PO TABS: 100.0000 mg | ORAL_TABLET | Freq: Four times a day (QID) | ORAL | 1 refills | Status: DC | PRN

## 2017-01-19 NOTE — Telephone Encounter (Signed)
Patient requested refill to be sent to [facility]

## 2017-01-19 NOTE — Telephone Encounter (Signed)
[place] data based checked, RX last filled by Dr.Byrum (lung specialist) on 11/24/16  Per Janett Billow, contact patient and inform her that only 1 provider should refill controlled substances for continuity of care and compliance.   Left message on voicemail for patient to return call when available

## 2017-01-20 ENCOUNTER — Telehealth: Payer: Self-pay | Admitting: Medical Oncology

## 2017-01-20 ENCOUNTER — Other Ambulatory Visit: Payer: Self-pay | Admitting: Medical Oncology

## 2017-01-20 DIAGNOSIS — C3411 Malignant neoplasm of upper lobe, right bronchus or lung: Secondary | ICD-10-CM

## 2017-01-20 DIAGNOSIS — Z91041 Radiographic dye allergy status: Secondary | ICD-10-CM

## 2017-01-20 NOTE — Telephone Encounter (Signed)
Spoke with patient, patient states she would rather have Dr.Byrum's office prescribe alprazolam (xanax)  [name], NP verbally informed

## 2017-01-20 NOTE — Telephone Encounter (Signed)
Called in prednisone and benadryl for premed for ct dye

## 2017-01-21 ENCOUNTER — Other Ambulatory Visit: Payer: Self-pay | Admitting: Nurse Practitioner

## 2017-01-21 ENCOUNTER — Other Ambulatory Visit: Payer: Self-pay | Admitting: Internal Medicine

## 2017-01-21 DIAGNOSIS — K219 Gastro-esophageal reflux disease without esophagitis: Secondary | ICD-10-CM

## 2017-01-26 ENCOUNTER — Telehealth: Payer: Self-pay | Admitting: Emergency Medicine

## 2017-01-26 ENCOUNTER — Telehealth: Payer: Self-pay | Admitting: *Deleted

## 2017-01-26 NOTE — Telephone Encounter (Signed)
Called and spoke with pt and she is aware of the number to call in PCP office to get set up with a new primary care doctor.  Nothing further is needed.

## 2017-01-26 NOTE — Telephone Encounter (Signed)
Patient calling asking for something different than Protonix, per pt she is hurting from gallstones. I tried to schedule pt an appt and pt declined stating that she has too many dr's to see and can't come in. Per pt Dr. Nyoka Cowden never required her to come in for things like this. I could not get more information from pt. Please advise

## 2017-01-27 ENCOUNTER — Other Ambulatory Visit: Payer: Self-pay | Admitting: Medical Oncology

## 2017-01-27 NOTE — Telephone Encounter (Signed)
Pt switched provider from [name] to a [facility] provider. Janett Billow was prescribing her pain med.  Will  Mohamed  refill her hydrocodone and tramadol?

## 2017-01-27 NOTE — Telephone Encounter (Signed)
No. It needs to come from the new provider.

## 2017-01-27 NOTE — Telephone Encounter (Signed)
Pt returning call and I advised again she would need to be seen and pt stated she will be going to [place] and hung up the phone.

## 2017-01-27 NOTE — Telephone Encounter (Signed)
Has she followed up with GI regarding this? Understand that she sees a lot of doctors but gathering all the information during an office visit in beneficial for best patient care.

## 2017-01-27 NOTE — Telephone Encounter (Signed)
Attempted to call patient but no answer. Could not leave a voicemail due mailbox being full.

## 2017-01-28 ENCOUNTER — Telehealth: Payer: Self-pay | Admitting: Emergency Medicine

## 2017-01-28 NOTE — Telephone Encounter (Signed)
Returned call to pt advised MD will be unable to fill pts pain medication as this willl need to come from her PCP. Pt very upset advised her new PCP and [facility] have said they will not refill pain meds. Discussed with pt to call back to the offices and ask for clarification on why her pain meds will not be filled. Explained to Pt MD out of office today. No further concerns. Pt verbalized understanding.

## 2017-01-28 NOTE — Telephone Encounter (Signed)
Spoke with pt, she states she would like RB to write her medications. She states her doctor retired at [employer] and she transferred her care to another doctor. She states Dr. Jenny Reichmann unable to write her pain medications. [name] will not write the medications and she does not understand why when she has cancer. RB please advise.

## 2017-01-31 NOTE — Telephone Encounter (Signed)
Duplicate encounter.  See other phone note from 10/19.

## 2017-01-31 NOTE — Telephone Encounter (Signed)
Spoke with patient. She is aware of RB's recs. Nothing else needed at time of call.

## 2017-01-31 NOTE — Telephone Encounter (Signed)
I am willing to manage [name]'s pain medications, as long as things aren't changing significantly requiring someone with more expertise to manage.

## 2017-01-31 NOTE — Telephone Encounter (Signed)
[name] calling to check and see what [name] said about handling her pain meds.-tr

## 2017-02-01 ENCOUNTER — Telehealth: Payer: Self-pay | Admitting: Emergency Medicine

## 2017-02-01 NOTE — Telephone Encounter (Signed)
Spoke with pt, she states she wonders if they are any cancer doctors she can see. She wants to switch doctors and would like your input on this. She states he doubled the strength chemotherapy and she does it once a month but feels he it shouldn't be doubled because she feels crappy. She rather break up the doses twice month and thinks the strength is too strong for her. I advised that RB could not overstep his recommendations because he is the expert in this situation and should follow his directions. Please advise RB.

## 2017-02-03 ENCOUNTER — Ambulatory Visit: Payer: Medicare Other

## 2017-02-03 ENCOUNTER — Other Ambulatory Visit: Payer: Self-pay | Admitting: Internal Medicine

## 2017-02-03 ENCOUNTER — Telehealth (HOSPITAL_COMMUNITY): Payer: Self-pay | Admitting: Physician Assistant

## 2017-02-03 ENCOUNTER — Ambulatory Visit (HOSPITAL_BASED_OUTPATIENT_CLINIC_OR_DEPARTMENT_OTHER): Payer: Medicare Other | Admitting: Oncology

## 2017-02-03 ENCOUNTER — Telehealth: Payer: Self-pay | Admitting: Internal Medicine

## 2017-02-03 ENCOUNTER — Other Ambulatory Visit (HOSPITAL_BASED_OUTPATIENT_CLINIC_OR_DEPARTMENT_OTHER): Payer: Medicare Other

## 2017-02-03 VITALS — BP 148/52 | HR 82 | Temp 99.1°F | Resp 17 | Ht 65.0 in | Wt 172.2 lb

## 2017-02-03 DIAGNOSIS — Z23 Encounter for immunization: Secondary | ICD-10-CM

## 2017-02-03 DIAGNOSIS — K2100 Gastro-esophageal reflux disease with esophagitis, without bleeding: Secondary | ICD-10-CM

## 2017-02-03 DIAGNOSIS — R5382 Chronic fatigue, unspecified: Secondary | ICD-10-CM

## 2017-02-03 DIAGNOSIS — Z5112 Encounter for antineoplastic immunotherapy: Secondary | ICD-10-CM

## 2017-02-03 DIAGNOSIS — Z79899 Other long term (current) drug therapy: Secondary | ICD-10-CM | POA: Diagnosis not present

## 2017-02-03 DIAGNOSIS — J449 Chronic obstructive pulmonary disease, unspecified: Secondary | ICD-10-CM | POA: Diagnosis not present

## 2017-02-03 DIAGNOSIS — C3411 Malignant neoplasm of upper lobe, right bronchus or lung: Secondary | ICD-10-CM

## 2017-02-03 DIAGNOSIS — E876 Hypokalemia: Secondary | ICD-10-CM | POA: Diagnosis not present

## 2017-02-03 LAB — CBC WITH DIFFERENTIAL/PLATELET
BASO%: 0.5 % (ref 0.0–2.0)
Basophils Absolute: 0 10*3/uL (ref 0.0–0.1)
EOS ABS: 0.5 10*3/uL (ref 0.0–0.5)
EOS%: 6 % (ref 0.0–7.0)
HEMATOCRIT: 38.6 % (ref 34.8–46.6)
HEMOGLOBIN: 12.6 g/dL (ref 11.6–15.9)
LYMPH#: 1.6 10*3/uL (ref 0.9–3.3)
LYMPH%: 20.7 % (ref 14.0–49.7)
MCH: 29.5 pg (ref 25.1–34.0)
MCHC: 32.6 g/dL (ref 31.5–36.0)
MCV: 90.4 fL (ref 79.5–101.0)
MONO#: 0.6 10*3/uL (ref 0.1–0.9)
MONO%: 8 % (ref 0.0–14.0)
NEUT%: 64.8 % (ref 38.4–76.8)
NEUTROS ABS: 4.9 10*3/uL (ref 1.5–6.5)
Platelets: 393 10*3/uL (ref 145–400)
RBC: 4.27 10*6/uL (ref 3.70–5.45)
RDW: 12.7 % (ref 11.2–14.5)
WBC: 7.5 10*3/uL (ref 3.9–10.3)

## 2017-02-03 LAB — COMPREHENSIVE METABOLIC PANEL
ALBUMIN: 3.9 g/dL (ref 3.5–5.0)
ALK PHOS: 95 U/L (ref 40–150)
ALT: 9 U/L (ref 0–55)
AST: 16 U/L (ref 5–34)
Anion Gap: 14 mEq/L — ABNORMAL HIGH (ref 3–11)
BILIRUBIN TOTAL: 0.31 mg/dL (ref 0.20–1.20)
BUN: 11.7 mg/dL (ref 7.0–26.0)
CALCIUM: 10.3 mg/dL (ref 8.4–10.4)
CO2: 27 mEq/L (ref 22–29)
Chloride: 100 mEq/L (ref 98–109)
Creatinine: 1.2 mg/dL — ABNORMAL HIGH (ref 0.6–1.1)
EGFR: 45 mL/min/{1.73_m2} — ABNORMAL LOW (ref >60)
GLUCOSE: 108 mg/dL (ref 70–140)
Potassium: 3.3 mEq/L — ABNORMAL LOW (ref 3.5–5.1)
SODIUM: 140 meq/L (ref 136–145)
TOTAL PROTEIN: 7.5 g/dL (ref 6.4–8.3)

## 2017-02-03 LAB — TSH: TSH: 1.92 m(IU)/L (ref 0.308–3.960)

## 2017-02-03 MED ORDER — INFLUENZA VAC SPLIT HIGH-DOSE 0.5 ML IM SUSY
0.5000 mL | PREFILLED_SYRINGE | INTRAMUSCULAR | Status: AC
  Administered 2017-02-03: 0.5 mL via INTRAMUSCULAR
  Filled 2017-02-03: qty 0.5

## 2017-02-03 NOTE — Telephone Encounter (Signed)
User: Cherie Dark A Date/time: 02/03/2017 11:28 AM  Comment: Called pt and spoke with her to r/s myoview and she voiced that she has alot going on health wise and will decline this test right now..RG  Context: [place] Schedule Orders/Appt Requests Outcome: Completed  Phone number: [phone] Phone Type: Home Phone  Comm. type: Telephone Call type: Outgoing  Contact: Margaretmary Lombard Relation to patient: Self  Letter:      User: Cherie Dark A Date/time: 01/26/2017 10:27 AM  Comment: Called pt and lmsg for her to CB to r/s lexiscan.   Context: [place] Schedule Orders/Appt Requests Outcome: Left Message  Phone number: [phone] Phone Type: Home Phone  Comm. type: Telephone Call type: Outgoing  Contact: Margaretmary Lombard Relation to patient: Self  Letter:

## 2017-02-03 NOTE — Telephone Encounter (Signed)
RB please advise. Thanks.  

## 2017-02-03 NOTE — Telephone Encounter (Signed)
Please let her know that I am sorry but I do not have any other oncologist that I would recommend any higher than those with whom she is already working.  I just do not have any good experience with any of the alternatives.

## 2017-02-03 NOTE — Telephone Encounter (Signed)
Pt is aware of RB's below message and voiced her understanding.  Nothing further needed.

## 2017-02-03 NOTE — Progress Notes (Signed)
[facility] Follow up:    Patient, No Pcp Per No address on file   DIAGNOSIS: Recurrent non-small cell lung cancer initially diagnosed as a stage IIA adenocarcinoma in December 2012 with no actionable mutations.   SUMMARY OF ONCOLOGIC HISTORY:   Primary cancer of right upper lobe of lung ([place])   02/08/2011 Initial Diagnosis    Non-small cell lung cancer ([place])      PRIOR THERAPY: 1) Status post radiation therapy to the right upper lobe lung mass completed on 06/04/2011 under the care of Dr. Pablo Ledger.  2) Systemic chemotherapy with carboplatin for AUC of 5 and Alimta 500 mg/M2 giving every 3 weeks, status post 3 cycles, last dose was given on 07/21/2011.  3) Systemic chemotherapy with carboplatin for AUC of 5 and Alimta 500 MG/M2 every 3 weeks. First dose on 01/02/2015. Status post 2 cycles, last dose was given 01/30/2015. Her treatment is currently on hold secondary to intolerance. 4) Immunotherapy with Nivolumab 240 M IV every 2 weeks status post 14 cycles.   CURRENT THERAPY: The patient will resume her treatment again with immunotherapy with Nivolumab 240 mg IV every 2 weeks. First dose 09/15/2016. Status post 6cycles. She was switched to Nivolumab 480 mg IV every 4 weeks on 12/09/2016. Status post 1 cycle.  INTERVAL HISTORY: [name] 71 y.o. female returns for routine follow-up visit with her son and daughter. The patient canceled her visit last month because she was not feeling well. She is not received her Nivolumab since August 2018. The patient reports that she remains very weak and fatigued. The patient thinks that the monthly dose of Nivolumab was too strong and caused her more side effects. She wants to go back to taking Nivolumab every 2 weeks. The patient denies fevers and chills. She denies chest pain and hemoptysis. She has an ongoing cough which is unchanged. She continues to wear oxygen at 4 L/m. Denies nausea, vomiting, constipation, diarrhea.  Appetite is fair. Weight has remained stable. Denies rashes. Patient's daughter has expressed that the patient has been more emotional lately. There have been some changes to her medications recently. They plan to discuss this further with her primary care provider in the near future. The patient is here for evaluation prior to cycle 2 of her every 4 week Nivolumab.   Patient Active Problem List   Diagnosis Date Noted  . CAD (coronary artery disease) 11/30/2016  . Candida infection 07/21/2016  . Benign paroxysmal positional vertigo 01/20/2016  . Encounter for antineoplastic immunotherapy 08/19/2015  . Chronic fatigue 08/19/2015  . Anxiety state 07/29/2015  . Headache, tension type, chronic 09/17/2014  . Hip pain, chronic 09/17/2014  . History of fall 05/21/2014  . Insomnia 01/09/2014  . Gait abnormality 08/16/2013  . DM type 2 with diabetic peripheral neuropathy ([place]) 08/01/2013  . Oropharyngeal dysphagia 08/01/2013  . PUD (peptic ulcer disease) 08/01/2013  . Myalgia and myositis 08/01/2013  . Esophageal reflux 06/27/2013  . Atherosclerosis of native arteries of the extremities with intermittent claudication 10/26/2012  . 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 Restless legs syndrome (RLS)   . GERD (gastroesophageal reflux disease)   . Heart murmur   . History of blood clots   . Syncope and collapse   . History of blood transfusion   . Fibromyalgia   . DDD (degenerative disc disease), lumbar   . Scoliosis   . Mental disorder   . CHF (congestive heart failure) ([place])   . Hx of radiation therapy   .  Back pain 11/26/2011  . Chronic cough 11/26/2011  . SOB (shortness of breath) 09/15/2011  . Weakness generalized 09/15/2011  . Anemia 09/15/2011  . Hypokalemia 08/13/2011  . Primary cancer of right upper lobe of lung ([place]) 02/08/2011  . Respiratory failure with hypoxia ([place]) 01/02/2010  . PAD (peripheral artery disease) ([place]) 08/06/2008  . Dyslipidemia 10/28/2006  . Essential  hypertension 10/28/2006  . Sinusitis, chronic 10/28/2006  . COPD (chronic obstructive pulmonary disease) ([place]) 10/28/2006    is allergic to iohexol; other; anoro ellipta [umeclidinium-vilanterol]; codeine; and protonix [pantoprazole sodium].  MEDICAL HISTORY: Past Medical History:  Diagnosis Date  . Acute posthemorrhagic anemia   . Adenocarcinoma, lung ([place])    upper left lobe  . Anemia in neoplastic disease   . Anginal pain ([place])   . Anxiety   . Arthritis    "all over my body"  . Asthma   . Asymptomatic varicose veins   . Atherosclerosis of native arteries of the extremities, unspecified   . Atrial fibrillation ([place])   . Bronchial pneumonia    history  . 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 Chemotherapy adverse reaction 09/15/11   "makes me light headed and pass out; this is 3rd blood transfusion since going thru it"  . CHF (congestive heart failure) ([place])   . Chronic airway obstruction, not elsewhere classified   . Chronic fatigue 08/19/2015  . Chronic sinusitis   . Complication of anesthesia    slow to wake up  . COPD (chronic obstructive pulmonary disease) ([place])   . Coronary artery disease    Dr. Burt Knack had stress test done  . DDD (degenerative disc disease), lumbar   . Depressive disorder, not elsewhere classified   . Diseases of lips   . Disorder of bone and cartilage, unspecified   . Dizziness and giddiness   . DVT (deep venous thrombosis) ([place])   . Dysrhythmia    irregular  . Effects of radiation, unspecified   . Emphysema   . Encounter for antineoplastic immunotherapy 08/19/2015  . Fibromyalgia   . Functional urinary incontinence   . GERD (gastroesophageal reflux disease)   . Hard of hearing, right   . Heart murmur   . Herpes zoster without mention of complication   . Hip pain, chronic 09/17/2014  . History of blood clots    "2 in my aorta"  . History of blood transfusion   . History of bronchitis   . Hx of radiation therapy 04/28/11 -06/08/11   right  upper lobe-lung  . Hyperlipidemia   . Hyperpotassemia   . Hypertension   . Hypopotassemia   . Kidney infection    "related to chemo"  . Lumbago   . Malignant neoplasm of bronchus and lung, unspecified site   . Mental disorder   . Muscle weakness (generalized)   . Muscle weakness (generalized)   . Non-small cell lung cancer ([place]) 03/31/2011   Adenocarcinoma Rt upper lobe mass  . Osteoarthrosis, unspecified whether generalized or localized, unspecified site   . Osteoporosis   . Other abnormal blood chemistry   . Other and unspecified hyperlipidemia   . PAD (peripheral artery disease) ([place])   . Pain in thoracic spine   . Peripheral vascular disease ([place])   . 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 Primary cancer of right upper lobe of lung ([place]) 02/08/2011   Biopsy Rt upper lobe mass 03/31/11>>adenocarcinoma DIAGNOSIS: Stage IIB/IIIA non-small cell lung cancer consistent with adenocarcinoma diagnosed in December 2012.  PRIOR THERAPY:  1) Status post radiation therapy to the right upper lobe lung mass completed on 06/04/2011 under the care of Dr. Pablo Ledger.  2) Systemic chemotherapy with carboplatin for AUC of 5 and Alimta 500 mg/M2 giving every 3   . Reflux   . Reflux esophagitis   . Respiratory failure with hypoxia 01/02/2010  . Restless legs syndrome (RLS)   . Scoliosis   . Screening for thyroid disorder   . Shortness of breath    "anytime really"  . Sinus headache    "all the time"  . Spasm of muscle   . Syncope and collapse   . Type II or unspecified type diabetes mellitus with neurological manifestations, uncontrolled(250.62)   . Type II or unspecified type diabetes mellitus without mention of complication, not stated as uncontrolled   . Type II or unspecified type diabetes mellitus without mention of complication, not stated as uncontrolled   . Unspecified constipation   . Unspecified essential hypertension   . Unspecified hearing loss   . Unspecified hereditary and  idiopathic peripheral neuropathy   . Unspecified vitamin D deficiency   . Urinary tract infection, site not specified     SURGICAL HISTORY: Past Surgical History:  Procedure Laterality Date  . BRONCHOSCOPY  02/2011  . CARDIAC CATHETERIZATION    . CATARACT EXTRACTION  03/12/2013   Left Eye   . DILATION AND CURETTAGE OF UTERUS  1980's  . EYE SURGERY Left 2014   Cataract  . FETAL BLOOD TRANSFUSION  09/16/11  . JOINT REPLACEMENT  04/13/10   Left Hip  . LUNG LOBECTOMY  1986   left  . SEPTOPLASTY    . TOTAL HIP ARTHROPLASTY  01/2010   left  . TUBAL LIGATION  1980's    SOCIAL HISTORY: Social History   Social History  . Marital status: Divorced    Spouse name: N/A  . Number of children: 3  . Years of education: N/A   Occupational History  . disabled   . RETIRED Retired   Social History Main Topics  . Smoking status: Former Smoker    Packs/day: 1.00    Years: 40.00    Types: Cigarettes    Quit date: 02/10/1998  . Smokeless tobacco: Never Used  . Alcohol use No  . Drug use: No  . Sexual activity: No   Other Topics Concern  . Not on file   Social History Narrative  . No narrative on file    FAMILY HISTORY: Family History  Problem Relation Age of Onset  . Bone cancer Father   . Cancer Father        Bone  . Diabetes Mother   . Deep vein thrombosis Mother   . Heart disease Mother        Heart Disease before age 41-  PVD  . Hyperlipidemia Mother   . Hypertension Mother   . Varicose Veins Mother   . Arthritis Daughter     Review of Systems  Constitutional: Positive for fatigue. Negative for appetite change, chills, fever and unexpected weight change.  HENT:  Negative.   Eyes: Negative.   Respiratory: Positive for cough. Negative for hemoptysis.        The patient has her baseline shortness of breath. She remains on oxygen at 4 L/m.  Cardiovascular: Negative.   Gastrointestinal: Negative.   Genitourinary: Negative.    Musculoskeletal: Negative.   Skin:  Negative.   Neurological: Negative.   Hematological: Negative.   Psychiatric/Behavioral: Negative.  PHYSICAL EXAMINATION  ECOG PERFORMANCE STATUS: 2 - Symptomatic, <50% confined to bed  Vitals:   02/03/17 1343 02/03/17 1442  BP: (!) 170/61 (!) 148/52  Pulse: 92 82  Resp: 17   Temp: 99.1 F (37.3 C)   SpO2: 100%     Physical Exam  Constitutional: She is oriented to person, place, and time and well-developed, well-nourished, and in no distress. No distress.  HENT:  Head: Normocephalic and atraumatic.  Mouth/Throat: No oropharyngeal exudate.  Eyes: Conjunctivae are normal. Right eye exhibits no discharge. Left eye exhibits no discharge. No scleral icterus.  Neck: Normal range of motion. Neck supple.  Cardiovascular: Normal rate, regular rhythm, normal heart sounds and intact distal pulses.   Pulmonary/Chest: Effort normal. No respiratory distress. She has no wheezes. She has no rales.  Diminished breath sounds bilaterally.  Abdominal: Soft. Bowel sounds are normal. She exhibits no distension and no mass. There is no tenderness.  Musculoskeletal: Normal range of motion. She exhibits no edema.  Lymphadenopathy:    She has no cervical adenopathy.  Neurological: She is alert and oriented to person, place, and time. She exhibits normal muscle tone. Coordination normal.  Skin: Skin is warm and dry. No rash noted. She is not diaphoretic. No erythema. No pallor.  Psychiatric: Mood, memory, affect and judgment normal.  Vitals reviewed.   LABORATORY DATA:  CBC    Component Value Date/Time   WBC 7.5 02/03/2017 1240   WBC 9.2 03/08/2016 1658   RBC 4.27 02/03/2017 1240   RBC 3.68 (L) 03/08/2016 1658   HGB 12.6 02/03/2017 1240   HCT 38.6 02/03/2017 1240   PLT 393 02/03/2017 1240   MCV 90.4 02/03/2017 1240   MCH 29.5 02/03/2017 1240   MCH 31.0 03/08/2016 1658   MCHC 32.6 02/03/2017 1240   MCHC 34.1 03/08/2016 1658   RDW 12.7 02/03/2017 1240   LYMPHSABS 1.6 02/03/2017  1240   MONOABS 0.6 02/03/2017 1240   EOSABS 0.5 02/03/2017 1240   EOSABS 0.4 08/06/2015 1036   BASOSABS 0.0 02/03/2017 1240    CMP     Component Value Date/Time   NA 140 02/03/2017 1240   K 3.3 (L) 02/03/2017 1240   CL 99 11/30/2016 1627   CL 99 07/21/2012 1125   CO2 27 02/03/2017 1240   GLUCOSE 108 02/03/2017 1240   GLUCOSE 184 (H) 07/21/2012 1125   BUN 11.7 02/03/2017 1240   CREATININE 1.2 (H) 02/03/2017 1240   CALCIUM 10.3 02/03/2017 1240   PROT 7.5 02/03/2017 1240   ALBUMIN 3.9 02/03/2017 1240   AST 16 02/03/2017 1240   ALT 9 02/03/2017 1240   ALKPHOS 95 02/03/2017 1240   BILITOT 0.31 02/03/2017 1240   GFRNONAA 53 (L) 11/30/2016 1627   GFRAA 61 11/30/2016 1627     RADIOGRAPHIC STUDIES:  No results found.  ASSESSMENT and THERAPY PLAN:   Primary cancer of right upper lobe of lung [facility]) This is a very pleasant 71 year old white female with recurrent non-small cell lung cancer, adenocarcinoma that was initially diagnosed as unresectable stage IIA in December 2012 status post systemic chemotherapy as well as immunotherapy. She is currently on treatment with Nivolumab status post 4cycles of the new regimen. She is tolerating the treatment well with no significant adverse effects. The patient has repeat CT scan of the chest for restaging of her disease. In general, it is very stable compared to the most recent one.  The patient was started on every 4 week Nivolumab in August 2018. She  canceled her appointment in September because she was not feeling well. The patient expresses that she does not want to continue on the monthly Nivolumab due to increased side effects. The patient was quite distressed at times today and considering switching medical oncologist. I outlined her options today which include continuing on Nivolumab 480 mg IV every 4 weeks versus changing back to Nivolumab 240 mg every 2 weeks. We also discussed switching physicians was also her decision. If she chose  to do this, I would need to make a referral for her and this may take some time for her to get reestablished with a new provider and to restart treatment plan as outlined by them. I told her that we would support her in any decision that she makes.  After a lengthy discussion with the patient and her family members, the patient has decided to continue with her current medical oncologist and to switch back to Nivolumab every 2 weeks. This will need to be started next week because we will need to have this new treatment plan approved by her insurance. I will communicate this to [name] upon his return tomorrow.  I will have the patient return next week for Nivolumab 240 mg IV which will be given every 2 weeks. She will be seen back for labs and a visit with the subsequent cycle.  Flu vaccine was given to the patient today per her request.  For COPD, the patient will continue with her current treatment and I advised her to keep her follow-up appointment with [name].  She was advised to call immediately if she has any concerning symptoms in the interval.  Hypokalemia The patient's potassium level is slightly low today at 3.3. I asked her to increase her potassium pill to 2 tablets daily for 3 days. Recheck her CMET with her next cycle of treatment.   I spent 30 minutes counseling the patient face to face. The total time spent in the appointment was 40 minutes.  No orders of the defined types were placed in this encounter.   All questions were answered. The patient knows to call the clinic with any problems, questions or concerns. We can certainly see the patient much sooner if necessary.  Mikey Bussing, NP 02/04/2017

## 2017-02-03 NOTE — Telephone Encounter (Signed)
Gave avs and calendar for November  °

## 2017-02-04 ENCOUNTER — Telehealth: Payer: Self-pay | Admitting: Internal Medicine

## 2017-02-04 ENCOUNTER — Telehealth: Payer: Self-pay | Admitting: Emergency Medicine

## 2017-02-04 DIAGNOSIS — M5136 Other intervertebral disc degeneration, lumbar region: Secondary | ICD-10-CM

## 2017-02-04 DIAGNOSIS — M25552 Pain in left hip: Secondary | ICD-10-CM

## 2017-02-04 DIAGNOSIS — G8929 Other chronic pain: Secondary | ICD-10-CM

## 2017-02-04 DIAGNOSIS — G44229 Chronic tension-type headache, not intractable: Secondary | ICD-10-CM

## 2017-02-04 MED ORDER — HYDROCODONE-ACETAMINOPHEN 10-325 MG PO TABS: ORAL_TABLET | ORAL | 0 refills | Status: DC

## 2017-02-04 NOTE — Telephone Encounter (Signed)
Scheduled appt per 10/26 sch message - patient is aware of appt date and time.

## 2017-02-04 NOTE — Assessment & Plan Note (Signed)
This is a very pleasant 71 year old white female with recurrent non-small cell lung cancer, adenocarcinoma that was initially diagnosed as unresectable stage IIA in December 2012 status post systemic chemotherapy as well as immunotherapy. She is currently on treatment with Nivolumab status post 4cycles of the new regimen. She is tolerating the treatment well with no significant adverse effects. The patient has repeat CT scan of the chest for restaging of her disease. In general, it is very stable compared to the most recent one.  The patient was started on every 4 week Nivolumab in August 2018. She canceled her appointment in September because she was not feeling well. The patient expresses that she does not want to continue on the monthly Nivolumab due to increased side effects. The patient was quite distressed at times today and considering switching medical oncologist. I outlined her options today which include continuing on Nivolumab 480 mg IV every 4 weeks versus changing back to Nivolumab 240 mg every 2 weeks. We also discussed switching physicians was also her decision. If she chose to do this, I would need to make a referral for her and this may take some time for her to get reestablished with a new provider and to restart treatment plan as outlined by them. I told her that we would support her in any decision that she makes.  After a lengthy discussion with the patient and her family members, the patient has decided to continue with her current medical oncologist and to switch back to Nivolumab every 2 weeks. This will need to be started next week because we will need to have this new treatment plan approved by her insurance. I will communicate this to [name] upon his return tomorrow.  I will have the patient return next week for Nivolumab 240 mg IV which will be given every 2 weeks. She will be seen back for labs and a visit with the subsequent cycle.  Flu vaccine was given to the patient  today per her request.  For COPD, the patient will continue with her current treatment and I advised her to keep her follow-up appointment with [name].  She was advised to call immediately if she has any concerning symptoms in the interval.

## 2017-02-04 NOTE — Telephone Encounter (Signed)
Spoke with patient. She is requesting a refill on her Norco 10-325mg . Verified the strength and instructions on the phone with the patient. She wishes to pick up this RX. She is aware that can not fill the RX until 02/06/17.  RX is currently listed in the system for 120 tablets. She is taking one tablet by mouth every 6 hours as needed for pain.   RB, please advise if you are ok with this. Thanks!

## 2017-02-04 NOTE — Assessment & Plan Note (Signed)
The patient's potassium level is slightly low today at 3.3. I asked her to increase her potassium pill to 2 tablets daily for 3 days. Recheck her CMET with her next cycle of treatment.

## 2017-02-04 NOTE — Telephone Encounter (Signed)
RX has been printed and signed by RB. Will advise patient to pick up RX Monday morning. Patient is aware. RX is up front.

## 2017-02-04 NOTE — Telephone Encounter (Signed)
Yes I will sign

## 2017-02-06 ENCOUNTER — Other Ambulatory Visit: Payer: Self-pay | Admitting: Internal Medicine

## 2017-02-10 ENCOUNTER — Other Ambulatory Visit: Payer: Medicare Other

## 2017-02-10 ENCOUNTER — Ambulatory Visit: Payer: Medicare Other | Admitting: Oncology

## 2017-02-10 ENCOUNTER — Ambulatory Visit (HOSPITAL_BASED_OUTPATIENT_CLINIC_OR_DEPARTMENT_OTHER): Payer: Medicare Other

## 2017-02-10 VITALS — BP 105/89 | HR 90 | Temp 98.2°F | Resp 20

## 2017-02-10 DIAGNOSIS — C3411 Malignant neoplasm of upper lobe, right bronchus or lung: Secondary | ICD-10-CM

## 2017-02-10 DIAGNOSIS — Z5112 Encounter for antineoplastic immunotherapy: Secondary | ICD-10-CM

## 2017-02-10 MED ORDER — SODIUM CHLORIDE 0.9 % IV SOLN
Freq: Once | INTRAVENOUS | Status: AC
  Administered 2017-02-10: 16:00:00 via INTRAVENOUS

## 2017-02-10 MED ORDER — SODIUM CHLORIDE 0.9 % IV SOLN
240.0000 mg | Freq: Once | INTRAVENOUS | Status: AC
  Administered 2017-02-10: 240 mg via INTRAVENOUS
  Filled 2017-02-10: qty 24

## 2017-02-10 NOTE — Patient Instructions (Signed)
[facility] Discharge Instructions for Patients Receiving Chemotherapy  Today you received the following chemotherapy agents Opdivo  To help prevent nausea and vomiting after your treatment, we encourage you to take your nausea medication as directed   If you develop nausea and vomiting that is not controlled by your nausea medication, call the clinic.   BELOW ARE SYMPTOMS THAT SHOULD BE REPORTED IMMEDIATELY:  *FEVER GREATER THAN 100.5 F  *CHILLS WITH OR WITHOUT FEVER  NAUSEA AND VOMITING THAT IS NOT CONTROLLED WITH YOUR NAUSEA MEDICATION  *UNUSUAL SHORTNESS OF BREATH  *UNUSUAL BRUISING OR BLEEDING  TENDERNESS IN MOUTH AND THROAT WITH OR WITHOUT PRESENCE OF ULCERS  *URINARY PROBLEMS  *BOWEL PROBLEMS  UNUSUAL RASH Items with * indicate a potential emergency and should be followed up as soon as possible.  Feel free to call the clinic should you have any questions or concerns. The clinic phone number is [phone].  Please show the CHEMO ALERT CARD at check-in to the Emergency Department and triage nurse.   

## 2017-02-14 ENCOUNTER — Encounter: Payer: Self-pay | Admitting: Internal Medicine

## 2017-02-14 ENCOUNTER — Other Ambulatory Visit (INDEPENDENT_AMBULATORY_CARE_PROVIDER_SITE_OTHER): Payer: Medicare Other

## 2017-02-14 ENCOUNTER — Telehealth: Payer: Self-pay | Admitting: Emergency Medicine

## 2017-02-14 ENCOUNTER — Ambulatory Visit (INDEPENDENT_AMBULATORY_CARE_PROVIDER_SITE_OTHER): Payer: Medicare Other | Admitting: Internal Medicine

## 2017-02-14 VITALS — BP 144/70 | HR 101 | Temp 98.3°F | Ht 65.0 in | Wt 171.0 lb

## 2017-02-14 DIAGNOSIS — K5900 Constipation, unspecified: Secondary | ICD-10-CM | POA: Insufficient documentation

## 2017-02-14 DIAGNOSIS — E559 Vitamin D deficiency, unspecified: Secondary | ICD-10-CM

## 2017-02-14 DIAGNOSIS — K2100 Gastro-esophageal reflux disease with esophagitis, without bleeding: Secondary | ICD-10-CM

## 2017-02-14 DIAGNOSIS — R413 Other amnesia: Secondary | ICD-10-CM

## 2017-02-14 DIAGNOSIS — Z1159 Encounter for screening for other viral diseases: Secondary | ICD-10-CM | POA: Diagnosis not present

## 2017-02-14 DIAGNOSIS — E1142 Type 2 diabetes mellitus with diabetic polyneuropathy: Secondary | ICD-10-CM

## 2017-02-14 DIAGNOSIS — F419 Anxiety disorder, unspecified: Secondary | ICD-10-CM | POA: Diagnosis not present

## 2017-02-14 DIAGNOSIS — Z23 Encounter for immunization: Secondary | ICD-10-CM | POA: Diagnosis not present

## 2017-02-14 DIAGNOSIS — M542 Cervicalgia: Secondary | ICD-10-CM | POA: Diagnosis not present

## 2017-02-14 DIAGNOSIS — I10 Essential (primary) hypertension: Secondary | ICD-10-CM

## 2017-02-14 DIAGNOSIS — F329 Major depressive disorder, single episode, unspecified: Secondary | ICD-10-CM

## 2017-02-14 DIAGNOSIS — I779 Disorder of arteries and arterioles, unspecified: Secondary | ICD-10-CM | POA: Diagnosis not present

## 2017-02-14 MED ORDER — FERROUS FUMARATE-FOLIC ACID 324-1 MG PO TABS: 1.0000 | ORAL_TABLET | Freq: Every day | ORAL | 1 refills | Status: DC

## 2017-02-14 MED ORDER — TRIAMTERENE-HCTZ 37.5-25 MG PO TABS: ORAL_TABLET | ORAL | 3 refills | Status: DC

## 2017-02-14 MED ORDER — TRAMADOL HCL 50 MG PO TABS: 100.0000 mg | ORAL_TABLET | Freq: Four times a day (QID) | ORAL | 1 refills | Status: DC | PRN

## 2017-02-14 MED ORDER — ALPRAZOLAM 1 MG PO TABS: 1.0000 mg | ORAL_TABLET | Freq: Two times a day (BID) | ORAL | 5 refills | Status: DC | PRN

## 2017-02-14 MED ORDER — POTASSIUM CHLORIDE CRYS ER 10 MEQ PO TBCR: 10.0000 meq | EXTENDED_RELEASE_TABLET | Freq: Every day | ORAL | 3 refills | Status: DC

## 2017-02-14 MED ORDER — DEXLANSOPRAZOLE 30 MG PO CPDR: 30.0000 mg | DELAYED_RELEASE_CAPSULE | Freq: Every day | ORAL | 3 refills | Status: DC

## 2017-02-14 MED ORDER — METFORMIN HCL 500 MG PO TABS: 500.0000 mg | ORAL_TABLET | Freq: Every day | ORAL | 3 refills | Status: DC | PRN

## 2017-02-14 NOTE — Assessment & Plan Note (Signed)
Ok for senna S qhs prn,  to f/u any worsening symptoms or concerns

## 2017-02-14 NOTE — Assessment & Plan Note (Signed)
Mild to mod uncontrolled, ok for restart celexa 20 qd, denies Si or HI, declines counseling referral

## 2017-02-14 NOTE — Assessment & Plan Note (Signed)
Mild uncontrolled, for add dexilant qam, nd change zantac 300 qhs

## 2017-02-14 NOTE — Assessment & Plan Note (Signed)
stable overall by history and exam, recent data reviewed with pt, and pt to continue medical treatment as before,  to f/u any worsening symptoms or concerns BP Readings from Last 3 Encounters:  02/14/17 (!) 144/70  02/10/17 105/89  02/03/17 (!) 148/52

## 2017-02-14 NOTE — Telephone Encounter (Signed)
She has tried and failed Stiolto in the past. She could try Trelegy, but we would need to be prepared for some barriers due to cost. Offer her trelegy one inhalation daily if she would like to try an alternative.

## 2017-02-14 NOTE — Assessment & Plan Note (Addendum)
?   Psych overlay, for check b12

## 2017-02-14 NOTE — Telephone Encounter (Signed)
Called and spoke with pt and she stated that the combivent has not been helping her and she wanted to see if RB could rec something else for her to try.  She stated that this med is not helping her with her breathing. RB please advise. Thanks

## 2017-02-14 NOTE — Progress Notes (Signed)
Subjective:    Patient ID: [name], female    DOB: [birth year], 71 y.o.   MRN: [id number]  HPI  Here for yearly exam to establish; did not get along well per pt with last PCP;  Overall doing ok;  Pt denies Chest pain, worsening SOB, DOE, wheezing, orthopnea, PND, worsening LE edema, palpitations, dizziness or syncope.  Pt denies neurological change such as new headache, facial or extremity weakness.  Pt denies polydipsia, polyuria, or low sugar symptoms. Pt states overall good compliance with treatment and medications, good tolerability, and has been trying to follow appropriate diet.   No fever, night sweats, wt loss, loss of appetite, or other constitutional symptoms.  Pt states fair ability with ADL's, has mod fall risk, home safety reviewed and adequate, no other significant changes in hearing or vision, and not active with exercise, as she has numerous issues including chronic respiratory hypoxic failure on 3L home o2.  Declines colonoscopy.  Does also c/o "All I do is cry" after medication stopped recently (celexa), and xanax prn alone just not working well.   Also c/o more constipation recently, has taken senna S qhs prn with some improvement and wondering if would hurt to take nightly.  Due for prevnar and Tdap.  Also feels she has had mild worsening forgetfullness and difficulty with cognitive task completion.  Also c/o uncontrolled reflux on zantac alone, but has hx of diarrhea with protonix.  Has not tried other.  Denies worsening abd pain, dysphagia, n/v, or blood.  Past Medical History:  Diagnosis Date  . Acute posthemorrhagic anemia   . 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 Anemia in neoplastic disease   . Anginal pain ([place])   . Anxiety   . Arthritis    "all over my body"  . Asthma   . Asymptomatic varicose veins   . Atherosclerosis of native arteries of the extremities, unspecified   . Atrial fibrillation ([place])   . Bronchial pneumonia    history  . Carcinoma  in situ of bronchus and lung   . Chemotherapy adverse reaction 09/15/11   "makes me light headed and pass out; this is 3rd blood transfusion since going thru it"  . CHF (congestive heart failure) ([place])   . Chronic airway obstruction, not elsewhere classified   . Chronic fatigue 08/19/2015  . Chronic sinusitis   . Complication of anesthesia    slow to wake up  . COPD (chronic obstructive pulmonary disease) ([place])   . Coronary artery disease    Dr. Burt Knack had stress test done  . DDD (degenerative disc disease), lumbar   . Depressive disorder, not elsewhere classified   . Diseases of lips   . Disorder of bone and cartilage, unspecified   . Dizziness and giddiness   . DVT (deep venous thrombosis) ([place])   . Dysrhythmia    irregular  . Effects of radiation, unspecified   . Emphysema   . Encounter for antineoplastic immunotherapy 08/19/2015  . Fibromyalgia   . Functional urinary incontinence   . GERD (gastroesophageal reflux disease)   . Hard of hearing, right   . Heart murmur   . Herpes zoster without mention of complication   . Hip pain, chronic 09/17/2014  . History of blood clots    "2 in my aorta"  . History of blood transfusion   . History of bronchitis   . Hx of radiation therapy 04/28/11 -06/08/11   right upper lobe-lung  . Hyperlipidemia   .  Hyperpotassemia   . Hypertension   . Hypopotassemia   . Kidney infection    "related to chemo"  . Lumbago   . Malignant neoplasm of bronchus and lung, unspecified site   . Mental disorder   . Muscle weakness (generalized)   . Muscle weakness (generalized)   . Non-small cell lung cancer ([facility]) 03/31/2011   Adenocarcinoma Rt upper lobe mass  . Osteoarthrosis, unspecified whether generalized or localized, unspecified site   . Osteoporosis   . Other abnormal blood chemistry   . Other and unspecified hyperlipidemia   . PAD (peripheral artery disease) ([facility])   . Pain in thoracic spine   . Peripheral vascular disease ([facility])   . Polyneuropathy  in diabetes(357.2)   . Primary cancer of right upper lobe of lung ([facility]) 02/08/2011   Biopsy Rt upper lobe mass 03/31/11>>adenocarcinoma DIAGNOSIS: Stage IIB/IIIA non-small cell lung cancer consistent with adenocarcinoma diagnosed in December 2012.   PRIOR THERAPY:  1) Status post radiation therapy to the right upper lobe lung mass completed on 06/04/2011 under the care of Dr. Wentworth.  2) Systemic chemotherapy with carboplatin for AUC of 5 and Alimta 500 mg/M2 giving every 3   . Reflux   . Reflux esophagitis   . Respiratory failure with hypoxia 01/02/2010  . Restless legs syndrome (RLS)   . Scoliosis   . Screening for thyroid disorder   . Shortness of breath    "anytime really"  . Sinus headache    "all the time"  . Spasm of muscle   . Syncope and collapse   . Type II or unspecified type diabetes mellitus with neurological manifestations, uncontrolled(250.62)   . Type II or unspecified type diabetes mellitus without mention of complication, not stated as uncontrolled   . Type II or unspecified type diabetes mellitus without mention of complication, not stated as uncontrolled   . Unspecified constipation   . Unspecified essential hypertension   . Unspecified hearing loss   . Unspecified hereditary and idiopathic peripheral neuropathy   . Unspecified vitamin D deficiency   . Urinary tract infection, site not specified    Past Surgical History:  Procedure Laterality Date  . BRONCHOSCOPY  02/2011  . CARDIAC CATHETERIZATION    . CATARACT EXTRACTION  03/12/2013   Left Eye   . DILATION AND CURETTAGE OF UTERUS  1980's  . EYE SURGERY Left 2014   Cataract  . FETAL BLOOD TRANSFUSION  09/16/11  . JOINT REPLACEMENT  04/13/10   Left Hip  . LUNG LOBECTOMY  1986   left  . SEPTOPLASTY    . TOTAL HIP ARTHROPLASTY  01/2010   left  . TUBAL LIGATION  1980's    reports that she quit smoking about 19 years ago. Her smoking use included cigarettes. She has a 40.00 pack-year smoking history. she  has never used smokeless tobacco. She reports that she does not drink alcohol or use drugs. family history includes Arthritis in her daughter; Bone cancer in her father; Cancer in her father; Deep vein thrombosis in her mother; Diabetes in her mother; Heart disease in her mother; Hyperlipidemia in her mother; Hypertension in her mother; Varicose Veins in her mother. Allergies  Allergen Reactions  . Iohexol Shortness Of Breath and Other (See Comments)    "burns me up inside"    Pt does fine with 13 hour prep  01/17/12  . Other     CAN'T STAND THE SMELL OF PURE BLEACH; "HAVE TO BACK UP AND POUR AND DON'T BREATH IT   TIL i GET CAP BACK ON; DILUTE IT TO SPRAY"  . Anoro Ellipta [Umeclidinium-Vilanterol] Swelling    Swelling of lips and mouth  . Codeine Itching and Nausea And Vomiting    REACTION: GI upset  . Protonix [Pantoprazole Sodium] Diarrhea   Current Outpatient Medications on File Prior to Visit  Medication Sig Dispense Refill  . ACCU-CHEK SOFTCLIX LANCETS lancets by Other route. Check blood sugar twice daily. Dx: E11.9, E11.49    . albuterol (PROVENTIL HFA;VENTOLIN HFA) 108 (90 Base) MCG/ACT inhaler Inhale 2 puffs into the lungs every 6 (six) hours as needed for wheezing or shortness of breath. 1 Inhaler 5  . albuterol (PROVENTIL) (2.5 MG/3ML) 0.083% nebulizer solution Take 3 mLs (2.5 mg total) by nebulization every 6 (six) hours as needed for wheezing or shortness of breath. 75 mL 5  . aspirin 81 MG tablet Take 81 mg by mouth daily.    . benzonatate (TESSALON) 100 MG capsule Take 1 capsule (100 mg total) by mouth 3 (three) times daily as needed for cough. 40 capsule 1  . Blood Glucose Monitoring Suppl (ACCU-CHEK AVIVA PLUS) w/Device KIT Use as directed to check blood sugar Dx: E11.9, E11.49 1 kit 0  . Calcium Carbonate-Vit D-Min (CALTRATE PLUS PO) Take 1 tablet by mouth daily.    . COMBIVENT RESPIMAT 20-100 MCG/ACT AERS respimat INHALE 1 PUFF INTO THE LUNGS FOUR TIMES A DAY WITH AN ADDITIONAL  1-2 PUFFS AS NEEDED *NOT TO EXCEED 6 PUFFS PER DAY* 1 Inhaler 3  . diltiazem (MATZIM LA) 240 MG 24 hr tablet Take 240 mg by mouth daily.    . DIPHENHIST 25 MG capsule TAKE 1 CAPSULE BY MOUTH 2 HOURS PRIOR TO CT SCAN 10 capsule 0  . fexofenadine (ALLEGRA) 180 MG tablet Take 180 mg by mouth daily.    . fluticasone (FLONASE) 50 MCG/ACT nasal spray INSTILL 2 SPRAYS IN EACH NOSTRIL TWICE DAILY *NEEDS APPOINTMENT* 16 g 5  . HYDROcodone-acetaminophen (NORCO) 10-325 MG tablet Take One tablet by mouth every 6 hours if needed for pain 120 tablet 0  . Multiple Vitamins-Minerals (CENTRUM SILVER PO) Take 1 tablet by mouth daily. Patient unsure of dose    . nystatin (MYCOSTATIN) 100000 UNIT/ML suspension Use as directed 5 mLs (500,000 Units total) in the mouth or throat 4 (four) times daily. 60 mL 1  . nystatin (MYCOSTATIN/NYSTOP) powder Apply topically 3 (three) times daily. 15 g 0  . OXYGEN Inhale 4 L into the lungs.    . ranitidine (ZANTAC) 300 MG tablet TAKE 1 TABLET(300 MG) BY MOUTH AT BEDTIME 15 tablet 0  . UNABLE TO FIND Med Name: Cranial Prothesis for Chemotherapy 1 each 0  . [DISCONTINUED] calcium carbonate 200 MG capsule Take 250 mg by mouth daily. Does not take everyday     Current Facility-Administered Medications on File Prior to Visit  Medication Dose Route Frequency Provider Last Rate Last Dose  . hyaluronate sodium (RADIAPLEXRX) gel   Topical Once Wentworth, Stacy, MD      . topical emolient (BIAFINE) emulsion   Topical PRN Wentworth, Stacy, MD       Review of Systems   Constitutional: Negative for other unusual diaphoresis or sweats HENT: Negative for ear discharge or swelling Eyes: Negative for other worsening visual disturbances Respiratory: Negative for stridor or other swelling  Gastrointestinal: Negative for worsening distension or other blood Genitourinary: Negative for retention or other urinary change Musculoskeletal: Negative for other MSK pain or swelling Skin: Negative for  color change or other   new lesions Neurological: Negative for worsening tremors and other numbness  Psychiatric/Behavioral: Negative for worsening agitation or other fatigue All other system neg per pt    Objective:   Physical Exam BP (!) 144/70   Pulse (!) 101   Temp 98.3 F (36.8 C) (Oral)   Ht 5' 5" (1.651 m)   Wt 171 lb (77.6 kg)   SpO2 100%   BMI 28.46 kg/m   - on home o2 3L VS noted, not ill appaering Constitutional: Pt appears in NAD HENT: Head: NCAT.  Right Ear: External ear normal.  Left Ear: External ear normal.  Eyes: . Pupils are equal, round, and reactive to light. Conjunctivae and EOM are normal Nose: without d/c or deformity Neck: Neck supple. Gross normal ROM Cardiovascular: Normal rate and regular rhythm.   Pulmonary/Chest: Effort normal and breath sounds without rales or wheezing.  Abd:  Soft, NT, ND, + BS, no organomegaly Neurological: Pt is alert. At baseline orientation, motor grossly intact Skin: Skin is warm. No rashes, other new lesions, no LE edema Psychiatric: Pt behavior is normal without agitation, + depressed/nervous affect  No other exam findings Lab Results  Component Value Date   WBC 7.5 02/03/2017   HGB 12.6 02/03/2017   HCT 38.6 02/03/2017   PLT 393 02/03/2017   GLUCOSE 108 02/03/2017   CHOL 176 11/22/2016   TRIG 93 11/22/2016   HDL 61 11/22/2016   LDLCALC 96 11/22/2016   ALT 9 02/03/2017   AST 16 02/03/2017   NA 140 02/03/2017   K 3.3 (L) 02/03/2017   CL 99 11/30/2016   CREATININE 1.2 (H) 02/03/2017   BUN 11.7 02/03/2017   CO2 27 02/03/2017   TSH 1.920 02/03/2017   INR 0.94 05/23/2012   HGBA1C 5.5 12/01/2016   MICROALBUR 4.4 12/01/2016      Assessment & Plan:

## 2017-02-14 NOTE — Telephone Encounter (Signed)
lmtcb x1 for pt. 

## 2017-02-14 NOTE — Patient Instructions (Addendum)
You had the Prevnar 13 pneumonia shot today, also the Tdap tetanus shot today  Ok to take the Senna S every bedtime  Ok to take the zantac 300 mg at night only  Please take all new medication as prescribed - the dexilant in the AM  Please continue all other medications as before, including the celexa 20 mg per day  Please have the pharmacy call with any other refills you may need.  Please continue your efforts at being more active, low cholesterol diet, and weight control.  You are otherwise up to date with prevention measures today.  Please keep your appointments with your specialists as you may have planned  Please go to the LAB in the Basement (turn left off the elevator) for the tests to be done today  You will be contacted by phone if any changes need to be made immediately.  Otherwise, you will receive a letter about your results with an explanation, but please check with MyChart first.  Please remember to sign up for MyChart if you have not done so, as this will be important to you in the future with finding out test results, communicating by private email, and scheduling acute appointments online when needed.  Please return in 6 months, or sooner if needed

## 2017-02-14 NOTE — Assessment & Plan Note (Signed)
stable overall by history and exam, recent data reviewed with pt, and pt to continue medical treatment as before,  to f/u any worsening symptoms or concerns,. For f/u a1c today

## 2017-02-15 ENCOUNTER — Telehealth: Payer: Self-pay | Admitting: Internal Medicine

## 2017-02-15 ENCOUNTER — Encounter: Payer: Self-pay | Admitting: Internal Medicine

## 2017-02-15 DIAGNOSIS — K2100 Gastro-esophageal reflux disease with esophagitis, without bleeding: Secondary | ICD-10-CM

## 2017-02-15 LAB — BASIC METABOLIC PANEL
BUN: 15 mg/dL (ref 6–23)
CALCIUM: 10 mg/dL (ref 8.4–10.5)
CHLORIDE: 100 meq/L (ref 96–112)
CO2: 26 mEq/L (ref 19–32)
CREATININE: 1.22 mg/dL — AB (ref 0.40–1.20)
GFR: 45.93 mL/min — AB (ref >60.00)
Glucose, Bld: 90 mg/dL (ref 70–99)
Potassium: 3.5 mEq/L (ref 3.5–5.1)
Sodium: 141 mEq/L (ref 135–145)

## 2017-02-15 LAB — HEPATITIS C ANTIBODY
HEP C AB: NONREACTIVE
SIGNAL TO CUT-OFF: 0.24 (ref <1.00)

## 2017-02-15 LAB — VITAMIN B12: Vitamin B-12: 376 pg/mL (ref 211–911)

## 2017-02-15 LAB — VITAMIN D 25 HYDROXY (VIT D DEFICIENCY, FRACTURES): VITD: 27.67 ng/mL — ABNORMAL LOW (ref 30.00–100.00)

## 2017-02-15 LAB — TSH: TSH: 1.48 u[IU]/mL (ref 0.35–4.50)

## 2017-02-15 MED ORDER — RANITIDINE HCL 300 MG PO TABS: ORAL_TABLET | ORAL | 4 refills | Status: DC

## 2017-02-15 MED ORDER — CITALOPRAM HYDROBROMIDE 20 MG PO TABS: 20.0000 mg | ORAL_TABLET | Freq: Every day | ORAL | 3 refills | Status: DC

## 2017-02-15 MED ORDER — FLUTICASONE-UMECLIDIN-VILANT 100-62.5-25 MCG/INH IN AEPB
1.0000 | INHALATION_SPRAY | Freq: Every day | RESPIRATORY_TRACT | 6 refills | Status: DC
Start: 2017-02-15 — End: 2017-09-26

## 2017-02-15 MED ORDER — RANITIDINE HCL 300 MG PO TABS: ORAL_TABLET | ORAL | 3 refills | Status: DC

## 2017-02-15 MED ORDER — FLUTICASONE-UMECLIDIN-VILANT 100-62.5-25 MCG/INH IN AEPB: 1.0000 | INHALATION_SPRAY | Freq: Every day | RESPIRATORY_TRACT | 0 refills | Status: DC

## 2017-02-15 NOTE — Telephone Encounter (Signed)
Pt called and stated she did not receive all of her medications yesterday ,  She states she was supposed to receive Celexa to stabilize her moods, she states she discussed this with JJ, also she states she was supposed to have Protonix sent in for her stomach, she stated her stomach hurts relly bad now and she is also missing Ritalin.  Please advise and call back

## 2017-02-15 NOTE — Addendum Note (Signed)
Addended by: [name] on: 02/15/2017 01:10 PM   Modules accepted: Orders

## 2017-02-15 NOTE — Telephone Encounter (Signed)
meds sent erx

## 2017-02-15 NOTE — Telephone Encounter (Signed)
She would like a call on her son's cell phone [phone])

## 2017-02-15 NOTE — Telephone Encounter (Signed)
Pt called back requesting some of these medications. Can you call her please?

## 2017-02-15 NOTE — Telephone Encounter (Signed)
Called pt, VM not set up.  She should be reminded that dexilant took place of protonix during her OV yesterday and I will have to follow up with Dr. Jenny Reichmann about the other medications. I do not see ritalin or celexa on her med list.

## 2017-02-15 NOTE — Telephone Encounter (Signed)
Called and spoke with pt and she is aware of sample of the trelegy and this has been left up front and she will come by and pick this up.  rx for this has been sent to her pharmacy. Nothing further is needed.

## 2017-02-15 NOTE — Telephone Encounter (Signed)
Pt notified of response and expressed understanding  She will wait for another call back regarding her other meds

## 2017-02-15 NOTE — Telephone Encounter (Signed)
Called pt, she stated that she wants her ritalin and celexa sent in. Those are the medications that she needed the most and do not have.   Call her on sons phone [phone]

## 2017-02-15 NOTE — Telephone Encounter (Signed)
Patient's son came in to the office with meds written down that patient is needing---she needs Ranitidine and Celexa refilled----per dr john---ok to restart ranitidine (zantac) and Celexa---it was discussed in office visit and he thought he sent it in earlier but did not by mistake---patient's son advised both rx's are being sent to [facility] now

## 2017-02-18 ENCOUNTER — Ambulatory Visit (HOSPITAL_COMMUNITY): Payer: Medicare Other | Attending: Internal Medicine

## 2017-02-18 ENCOUNTER — Other Ambulatory Visit: Payer: Self-pay

## 2017-02-18 DIAGNOSIS — I080 Rheumatic disorders of both mitral and aortic valves: Secondary | ICD-10-CM | POA: Diagnosis not present

## 2017-02-18 DIAGNOSIS — R0602 Shortness of breath: Secondary | ICD-10-CM

## 2017-02-18 DIAGNOSIS — I5030 Unspecified diastolic (congestive) heart failure: Secondary | ICD-10-CM | POA: Insufficient documentation

## 2017-02-21 ENCOUNTER — Encounter: Payer: Self-pay | Admitting: Physician Assistant

## 2017-02-21 ENCOUNTER — Other Ambulatory Visit: Payer: Self-pay | Admitting: Internal Medicine

## 2017-02-23 ENCOUNTER — Other Ambulatory Visit: Payer: Self-pay | Admitting: Medical Oncology

## 2017-02-23 DIAGNOSIS — C3411 Malignant neoplasm of upper lobe, right bronchus or lung: Secondary | ICD-10-CM

## 2017-02-24 ENCOUNTER — Other Ambulatory Visit (HOSPITAL_BASED_OUTPATIENT_CLINIC_OR_DEPARTMENT_OTHER): Payer: Medicare Other

## 2017-02-24 ENCOUNTER — Ambulatory Visit (HOSPITAL_BASED_OUTPATIENT_CLINIC_OR_DEPARTMENT_OTHER): Payer: Medicare Other | Admitting: Oncology

## 2017-02-24 ENCOUNTER — Ambulatory Visit (HOSPITAL_BASED_OUTPATIENT_CLINIC_OR_DEPARTMENT_OTHER): Payer: Medicare Other

## 2017-02-24 ENCOUNTER — Other Ambulatory Visit: Payer: Medicare Other

## 2017-02-24 ENCOUNTER — Encounter: Payer: Self-pay | Admitting: Oncology

## 2017-02-24 VITALS — BP 184/61 | HR 100 | Temp 98.3°F | Resp 18 | Ht 65.0 in | Wt 172.0 lb

## 2017-02-24 VITALS — BP 155/59 | HR 80 | Resp 18

## 2017-02-24 DIAGNOSIS — C3411 Malignant neoplasm of upper lobe, right bronchus or lung: Secondary | ICD-10-CM

## 2017-02-24 DIAGNOSIS — J449 Chronic obstructive pulmonary disease, unspecified: Secondary | ICD-10-CM | POA: Diagnosis not present

## 2017-02-24 DIAGNOSIS — Z5112 Encounter for antineoplastic immunotherapy: Secondary | ICD-10-CM

## 2017-02-24 LAB — COMPREHENSIVE METABOLIC PANEL
ALBUMIN: 3.8 g/dL (ref 3.5–5.0)
ALK PHOS: 84 U/L (ref 40–150)
ALT: 13 U/L (ref 0–55)
AST: 20 U/L (ref 5–34)
Anion Gap: 10 mEq/L (ref 3–11)
BILIRUBIN TOTAL: 0.39 mg/dL (ref 0.20–1.20)
BUN: 12.3 mg/dL (ref 7.0–26.0)
CO2: 29 meq/L (ref 22–29)
Calcium: 10 mg/dL (ref 8.4–10.4)
Chloride: 100 mEq/L (ref 98–109)
Creatinine: 1.1 mg/dL (ref 0.6–1.1)
EGFR: 51 mL/min/{1.73_m2} — AB (ref >60)
Glucose: 105 mg/dl (ref 70–140)
Potassium: 3.5 mEq/L (ref 3.5–5.1)
Sodium: 139 mEq/L (ref 136–145)
TOTAL PROTEIN: 6.9 g/dL (ref 6.4–8.3)

## 2017-02-24 LAB — CBC WITH DIFFERENTIAL/PLATELET
BASO%: 0.4 % (ref 0.0–2.0)
BASOS ABS: 0 10*3/uL (ref 0.0–0.1)
EOS%: 9.5 % — AB (ref 0.0–7.0)
Eosinophils Absolute: 0.9 10*3/uL — ABNORMAL HIGH (ref 0.0–0.5)
HEMATOCRIT: 39.2 % (ref 34.8–46.6)
HGB: 12.7 g/dL (ref 11.6–15.9)
LYMPH#: 1.8 10*3/uL (ref 0.9–3.3)
LYMPH%: 19.3 % (ref 14.0–49.7)
MCH: 29.3 pg (ref 25.1–34.0)
MCHC: 32.4 g/dL (ref 31.5–36.0)
MCV: 90.5 fL (ref 79.5–101.0)
MONO#: 0.5 10*3/uL (ref 0.1–0.9)
MONO%: 5.8 % (ref 0.0–14.0)
NEUT#: 6.1 10*3/uL (ref 1.5–6.5)
NEUT%: 65 % (ref 38.4–76.8)
PLATELETS: 223 10*3/uL (ref 145–400)
RBC: 4.33 10*6/uL (ref 3.70–5.45)
RDW: 13.4 % (ref 11.2–14.5)
WBC: 9.3 10*3/uL (ref 3.9–10.3)

## 2017-02-24 MED ORDER — SODIUM CHLORIDE 0.9 % IV SOLN
240.0000 mg | Freq: Once | INTRAVENOUS | Status: AC
  Administered 2017-02-24: 240 mg via INTRAVENOUS
  Filled 2017-02-24: qty 24

## 2017-02-24 MED ORDER — SODIUM CHLORIDE 0.9 % IV SOLN
Freq: Once | INTRAVENOUS | Status: AC
  Administered 2017-02-24: 16:00:00 via INTRAVENOUS

## 2017-02-24 NOTE — Progress Notes (Signed)
[facility] OFFICE PROGRESS NOTE  [name], MD [place] [address]  DIAGNOSIS: Recurrent non-small cell lung cancer initially diagnosed as a stage IIA adenocarcinoma in December 2012 with no actionable mutations.   PRIOR THERAPY: 1) Status post radiation therapy to the right upper lobe lung mass completed on 06/04/2011 under the care of Dr. Pablo Ledger.  2) Systemic chemotherapy with carboplatin for AUC of 5 and Alimta 500 mg/M2 giving every 3 weeks, status post 3 cycles, last dose was given on 07/21/2011.  3) Systemic chemotherapy with carboplatin for AUC of 5 and Alimta 500 MG/M2 every 3 weeks. First dose on 01/02/2015. Status post 2 cycles, last dose was given 01/30/2015. Her treatment is currently on hold secondary to intolerance. 4) Immunotherapy with Nivolumab 240 M IV every 2 weeks status post 14 cycles.  5) immunotherapy with Nivolumab 480 mg IV every 4 weeks.  Patient is status post 1 cycle.  The patient discontinued treatment with Nivolumab every 4 weeks because she want to switch back to every 2 weeks due to increased side effects with the monthly dosing.  CURRENT THERAPY:The patient will resume her treatment again with immunotherapy with Nivolumab 240 mg IV every 2 weeks.  First dose 02/10/2017.  INTERVAL HISTORY: [name] 71 y.o. female returns for routine follow-up visit by herself.  The patient is feeling fine today except for her ongoing baseline shortness of breath.  The patient has mild fatigue, but overall feels well today.  She tolerated her last cycle of Nivolumab without much difficulty.  She denies fevers and chills.  Denies chest pain, cough, hemoptysis.  Denies nausea, vomiting, constipation, diarrhea.  The patient is here for evaluation prior to cycle 2 of her every 2-week Nivolumab.  MEDICAL HISTORY: Past Medical History:  Diagnosis Date  . Adenocarcinoma, lung ([place])    upper left lobe  . Anemia in neoplastic disease    . Anxiety   . Arthritis    "all over my body"  . Asthma   . Asymptomatic varicose veins   . Atherosclerosis of native arteries of the extremities, unspecified   . Atrial fibrillation ([place])   . Bronchial pneumonia    history  . 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 Chemotherapy adverse reaction 09/15/11   "makes me light headed and pass out; this is 3rd blood transfusion since going thru it"  . CHF (congestive heart failure) ([place])   . Chronic airway obstruction, not elsewhere classified   . Chronic fatigue 08/19/2015  . Chronic sinusitis   . Complication of anesthesia    slow to wake up  . COPD with emphysema ([place])   . Coronary artery disease    Dr. Burt Knack had stress test done  . DDD (degenerative disc disease), lumbar   . Depressive disorder, not elsewhere classified   . Diseases of lips   . Disorder of bone and cartilage, unspecified   . DVT (deep venous thrombosis) ([place])   . Fibromyalgia   . Functional urinary incontinence   . GERD (gastroesophageal reflux disease)   . Hard of hearing, right   . Herpes zoster without mention of complication   . Hip pain, chronic 09/17/2014  . History of blood clots    "2 in my aorta"  . History of blood transfusion   . History of bronchitis   . History of echocardiogram    Echo 11/18: mild LVH, EF 60-65, no RWMA, Gr 1 DD, Ao  Sclerosis, mild AI, RVH with low nl RSVF, trivial eff  . Hx of radiation therapy 04/28/11 -06/08/11   right upper lobe-lung  . Hyperlipidemia   . Hypertension   . Kidney infection    "related to chemo"  . Lumbago   . Malignant neoplasm of bronchus and lung, unspecified site   . Mental disorder   . Muscle weakness (generalized)   . Non-small cell lung cancer ([place]) 03/31/2011   Adenocarcinoma Rt upper lobe mass  . Osteoarthrosis, unspecified whether generalized or localized, unspecified site   . Osteoporosis   . Other abnormal blood chemistry   . Other and unspecified hyperlipidemia   . PAD (peripheral artery  disease) ([place])   . Pain in thoracic spine   . 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 Primary cancer of right upper lobe of lung ([place]) 02/08/2011   Biopsy Rt upper lobe mass 03/31/11>>adenocarcinoma DIAGNOSIS: Stage IIB/IIIA non-small cell lung cancer consistent with adenocarcinoma diagnosed in December 2012.   PRIOR THERAPY:  1) Status post radiation therapy to the right upper lobe lung mass completed on 06/04/2011 under the care of Dr. Pablo Ledger.  2) Systemic chemotherapy with carboplatin for AUC of 5 and Alimta 500 mg/M2 giving every 3   . Reflux   . Reflux esophagitis   . Respiratory failure with hypoxia 01/02/2010  . Restless legs syndrome (RLS)   . Scoliosis   . Screening for thyroid disorder   . Shortness of breath    "anytime really"  . Sinus headache    "all the time"  . Spasm of muscle   . Syncope and collapse   . Type II or unspecified type diabetes mellitus with neurological manifestations, uncontrolled(250.62)   . Type II or unspecified type diabetes mellitus without mention of complication, not stated as uncontrolled   . Type II or unspecified type diabetes mellitus without mention of complication, not stated as uncontrolled   . Unspecified constipation   . Unspecified essential hypertension   . Unspecified hearing loss   . Unspecified hereditary and idiopathic peripheral neuropathy   . Unspecified vitamin D deficiency     ALLERGIES:  is allergic to iohexol; other; anoro ellipta [umeclidinium-vilanterol]; codeine; and protonix [pantoprazole sodium].  MEDICATIONS:  Current Outpatient Medications  Medication Sig Dispense Refill  . ACCU-CHEK SOFTCLIX LANCETS lancets by Other route. Check blood sugar twice daily. Dx: E11.9, E11.49    . albuterol (PROVENTIL HFA;VENTOLIN HFA) 108 (90 Base) MCG/ACT inhaler Inhale 2 puffs into the lungs every 6 (six) hours as needed for wheezing or shortness of breath. 1 Inhaler 5  . albuterol (PROVENTIL) (2.5 MG/3ML) 0.083% nebulizer solution  Take 3 mLs (2.5 mg total) by nebulization every 6 (six) hours as needed for wheezing or shortness of breath. 75 mL 5  . ALPRAZolam (XANAX) 1 MG tablet Take 1 tablet (1 mg total) 2 (two) times daily as needed by mouth for anxiety. 60 tablet 5  . aspirin 81 MG tablet Take 81 mg by mouth daily.    . benzonatate (TESSALON) 100 MG capsule Take 1 capsule (100 mg total) by mouth 3 (three) times daily as needed for cough. 40 capsule 1  . Blood Glucose Monitoring Suppl (ACCU-CHEK AVIVA PLUS) w/Device KIT Use as directed to check blood sugar Dx: E11.9, E11.49 1 kit 0  . Calcium Carbonate-Vit D-Min (CALTRATE PLUS PO) Take 1 tablet by mouth daily.    . citalopram (CELEXA) 20 MG tablet Take 1 tablet (20 mg total) daily by mouth. 90 tablet 3  .  COMBIVENT RESPIMAT 20-100 MCG/ACT AERS respimat INHALE 1 PUFF INTO THE LUNGS FOUR TIMES A DAY WITH AN ADDITIONAL 1-2 PUFFS AS NEEDED *NOT TO EXCEED 6 PUFFS PER DAY* 1 Inhaler 3  . Dexlansoprazole (DEXILANT) 30 MG capsule Take 1 capsule (30 mg total) daily by mouth. 90 capsule 3  . diltiazem (MATZIM LA) 240 MG 24 hr tablet Take 240 mg by mouth daily.    Marland Kitchen DIPHENHIST 25 MG capsule TAKE 1 CAPSULE BY MOUTH 2 HOURS PRIOR TO CT SCAN 10 capsule 0  . Ferrous Fumarate-Folic Acid (HEMATINIC/FOLIC ACID) 568-1 MG TABS Take 1 tablet daily by mouth. 90 each 1  . fexofenadine (ALLEGRA) 180 MG tablet Take 180 mg by mouth daily.    . fluticasone (FLONASE) 50 MCG/ACT nasal spray INSTILL 2 SPRAYS IN EACH NOSTRIL TWICE DAILY *NEEDS APPOINTMENT* 16 g 5  . Fluticasone-Umeclidin-Vilant (TRELEGY ELLIPTA) 100-62.5-25 MCG/INH AEPB Inhale 1 puff daily into the lungs. 1 each 0  . Fluticasone-Umeclidin-Vilant (TRELEGY ELLIPTA) 100-62.5-25 MCG/INH AEPB Inhale 1 puff daily into the lungs. 1 each 6  . HYDROcodone-acetaminophen (NORCO) 10-325 MG tablet Take One tablet by mouth every 6 hours if needed for pain 120 tablet 0  . metFORMIN (GLUCOPHAGE) 500 MG tablet Take 1 tablet (500 mg total) daily as needed  by mouth (high blood sugar- 160+). Take one tablet at night for blood sugar 90 tablet 3  . Multiple Vitamins-Minerals (CENTRUM SILVER PO) Take 1 tablet by mouth daily. Patient unsure of dose    . nystatin (MYCOSTATIN) 100000 UNIT/ML suspension Use as directed 5 mLs (500,000 Units total) in the mouth or throat 4 (four) times daily. 60 mL 1  . nystatin (MYCOSTATIN/NYSTOP) powder Apply topically 3 (three) times daily. 15 g 0  . OXYGEN Inhale 4 L into the lungs.    . potassium chloride (K-DUR,KLOR-CON) 10 MEQ tablet Take 1 tablet (10 mEq total) daily by mouth. 90 tablet 3  . ranitidine (ZANTAC) 300 MG tablet TAKE 1 TABLET(300 MG) BY MOUTH AT BEDTIME 90 tablet 3  . traMADol (ULTRAM) 50 MG tablet Take 2 tablets (100 mg total) every 6 (six) hours as needed by mouth for severe pain. 90 tablet 1  . triamterene-hydrochlorothiazide (MAXZIDE-25) 37.5-25 MG tablet TAKE 1 TABLET BY MOUTH EVERY DAY FOR BLOOD PRESSURE 90 tablet 3  . UNABLE TO FIND Med Name: Cranial Prothesis for Chemotherapy 1 each 0   No current facility-administered medications for this visit.    Facility-Administered Medications Ordered in Other Visits  Medication Dose Route Frequency Provider Last Rate Last Dose  . hyaluronate sodium (RADIAPLEXRX) gel   Topical Once Thea Silversmith, MD      . topical emolient (BIAFINE) emulsion   Topical PRN Thea Silversmith, MD        SURGICAL HISTORY:  Past Surgical History:  Procedure Laterality Date  . BRONCHOSCOPY  02/2011  . CARDIAC CATHETERIZATION    . CATARACT EXTRACTION  03/12/2013   Left Eye   . DILATION AND CURETTAGE OF UTERUS  1980's  . EYE SURGERY Left 2014   Cataract  . FETAL BLOOD TRANSFUSION  09/16/11  . JOINT REPLACEMENT  04/13/10   Left Hip  . LUNG LOBECTOMY  1986   left  . SEPTOPLASTY    . TOTAL HIP ARTHROPLASTY  01/2010   left  . TUBAL LIGATION  1980's    REVIEW OF SYSTEMS:   Review of Systems  Constitutional: Negative for appetite change, chills, fever and unexpected  weight change. Positive for fatigue. HENT:   Negative  for mouth sores, nosebleeds, sore throat and trouble swallowing.   Eyes: Negative for eye problems and icterus.  Respiratory: Negative for cough, hemoptysis, and wheezing.  She has her baseline shortness of breath. Cardiovascular: Negative for chest pain and leg swelling.  Gastrointestinal: Negative for abdominal pain, constipation, diarrhea, nausea and vomiting.  Genitourinary: Negative for bladder incontinence, difficulty urinating, dysuria, frequency and hematuria.   Musculoskeletal: Negative for back pain, gait problem, neck pain and neck stiffness.  Skin: Negative for itching and rash.  Neurological: Negative for dizziness, extremity weakness, gait problem, headaches, light-headedness and seizures.  Hematological: Negative for adenopathy. Does not bruise/bleed easily.  Psychiatric/Behavioral: Negative for confusion, depression and sleep disturbance. The patient is not nervous/anxious.     PHYSICAL EXAMINATION:  Blood pressure (!) 184/61, pulse 100, temperature 98.3 F (36.8 C), temperature source Oral, resp. rate 18, height _0  (1.651 m), weight 172 lb (78 kg), SpO2 97 %.  ECOG PERFORMANCE STATUS: 2 - Symptomatic, <50% confined to bed  Physical Exam  Constitutional: Oriented to person, place, and time and well-developed, well-nourished, and in no distress. No distress.  HENT:  Head: Normocephalic and atraumatic.  Mouth/Throat: Oropharynx is clear and moist. No oropharyngeal exudate.  Eyes: Conjunctivae are normal. Right eye exhibits no discharge. Left eye exhibits no discharge. No scleral icterus.  Neck: Normal range of motion. Neck supple.  Cardiovascular: Normal rate, regular rhythm, normal heart sounds and intact distal pulses.   Pulmonary/Chest: Effort normal and breath sounds normal. No respiratory distress. No wheezes. No rales.  Abdominal: Soft. Bowel sounds are normal. Exhibits no distension and no mass. There is no  tenderness.  Musculoskeletal: Normal range of motion. Exhibits no edema.  Lymphadenopathy:    No cervical adenopathy.  Neurological: Alert and oriented to person, place, and time. Exhibits normal muscle tone. Gait normal. Coordination normal.  Skin: Skin is warm and dry. No rash noted. Not diaphoretic. No erythema. No pallor.  Psychiatric: Mood, memory and judgment normal.  Vitals reviewed.  LABORATORY DATA: Lab Results  Component Value Date   WBC 9.3 02/24/2017   HGB 12.7 02/24/2017   HCT 39.2 02/24/2017   MCV 90.5 02/24/2017   PLT 223 02/24/2017      Chemistry      Component Value Date/Time   NA 139 02/24/2017 1422   K 3.5 02/24/2017 1422   CL 100 02/14/2017 1701   CL 99 07/21/2012 1125   CO2 29 02/24/2017 1422   BUN 12.3 02/24/2017 1422   CREATININE 1.1 02/24/2017 1422      Component Value Date/Time   CALCIUM 10.0 02/24/2017 1422   ALKPHOS 84 02/24/2017 1422   AST 20 02/24/2017 1422   ALT 13 02/24/2017 1422   BILITOT 0.39 02/24/2017 1422       RADIOGRAPHIC STUDIES:  No results found.   ASSESSMENT/PLAN:  Primary cancer of right upper lobe of lung [facility]) This is a very pleasant 71 year old white female with recurrent non-small cell lung cancer, adenocarcinoma that was initially diagnosed as unresectable stage IIA in December 2012 status post systemic chemotherapy as well as immunotherapy. She is currently on treatment with Nivolumab status post 4cycles of the new regimen. She is tolerating the treatment well with no significant adverse effects. The patient was started on every 4 week Nivolumab in August 2018.  She received 1 cycle of the 480 mg Nivolumab and had increased side effects.  She missed her appointment in September 2018.  The patient requested to change back to Nivolumab 240  mg IV every 2 weeks.  This was resumed on 02/10/2017.  The patient tolerated the cycle of Nivolumab very well.  The patient was seen with [name].  Recommend that she continue her  current therapy.  She will proceed with cycle 2 today as scheduled.  Plan is for restaging CT scan in approximately 1 month.  This will be ordered at her next visit.  The patient will return in 2 weeks for evaluation prior to cycle 3 of her Nivolumab.  For COPD, the patient will continue with her current treatment and I advised her to continue follow-up appointment with [name].  She was advised to call immediately if she has any concerning symptoms in the interval.   No orders of the defined types were placed in this encounter.   Mikey Bussing, DNP, AGPCNP-BC, AOCNP 02/24/17  ADDENDUM: Hematology/Oncology Attending: I had a face-to-face encounter with the patient.  I recommended her care plan.  This is a very pleasant 71 years old white female with recurrent non-small cell lung cancer, adenocarcinoma.  She is currently undergoing treatment with immunotherapy was Nivolumab every 2 weeks.  She was tried on the 4-week schedule but she had a lot of adverse effect with the high dose including fatigue and weakness. The patient resumed her treatment with Nivolumab again 240 mg IV every 2 weeks.  She tolerated the last cycle of her treatment fairly well.  She is feeling much better today. She is here today to start cycle #2 of this treatment. I will see the patient back for follow-up visit in 2 weeks for evaluation after repeating CT scan of the chest for restaging of her disease. The patient was advised to call immediately if she has any concerning symptoms in the interval.  Disclaimer: This note was dictated with voice recognition software. Similar sounding words can inadvertently be transcribed and may be missed upon review. Eilleen Kempf, MD 02/25/17

## 2017-02-24 NOTE — Assessment & Plan Note (Signed)
This is a very pleasant 71 year old white female with recurrent non-small cell lung cancer, adenocarcinoma that was initially diagnosed as unresectable stage IIA in December 2012 status post systemic chemotherapy as well as immunotherapy. She is currently on treatment with Nivolumab status post 4cycles of the new regimen. She is tolerating the treatment well with no significant adverse effects. The patient was started on every 4 week Nivolumab in August 2018.  She received 1 cycle of the 480 mg Nivolumab and had increased side effects.  She missed her appointment in September 2018.  The patient requested to change back to Nivolumab 240 mg IV every 2 weeks.  This was resumed on 02/10/2017.  The patient tolerated the cycle of Nivolumab very well.  The patient was seen with [name].  Recommend that she continue her current therapy.  She will proceed with cycle 2 today as scheduled.  Plan is for restaging CT scan in approximately 1 month.  This will be ordered at her next visit.  The patient will return in 2 weeks for evaluation prior to cycle 3 of her Nivolumab.  For COPD, the patient will continue with her current treatment and I advised her to continue follow-up appointment with [name].  She was advised to call immediately if she has any concerning symptoms in the interval.

## 2017-02-24 NOTE — Patient Instructions (Signed)
[facility] Discharge Instructions for Patients Receiving Chemotherapy  Today you received the following chemotherapy agents Opdivo  To help prevent nausea and vomiting after your treatment, we encourage you to take your nausea medication as directed   If you develop nausea and vomiting that is not controlled by your nausea medication, call the clinic.   BELOW ARE SYMPTOMS THAT SHOULD BE REPORTED IMMEDIATELY:  *FEVER GREATER THAN 100.5 F  *CHILLS WITH OR WITHOUT FEVER  NAUSEA AND VOMITING THAT IS NOT CONTROLLED WITH YOUR NAUSEA MEDICATION  *UNUSUAL SHORTNESS OF BREATH  *UNUSUAL BRUISING OR BLEEDING  TENDERNESS IN MOUTH AND THROAT WITH OR WITHOUT PRESENCE OF ULCERS  *URINARY PROBLEMS  *BOWEL PROBLEMS  UNUSUAL RASH Items with * indicate a potential emergency and should be followed up as soon as possible.  Feel free to call the clinic should you have any questions or concerns. The clinic phone number is [phone].  Please show the CHEMO ALERT CARD at check-in to the Emergency Department and triage nurse.   

## 2017-03-02 ENCOUNTER — Telehealth: Payer: Self-pay | Admitting: Internal Medicine

## 2017-03-02 ENCOUNTER — Ambulatory Visit: Payer: Medicare Other | Admitting: Internal Medicine

## 2017-03-02 ENCOUNTER — Other Ambulatory Visit: Payer: Medicare Other

## 2017-03-02 ENCOUNTER — Ambulatory Visit: Payer: Medicare Other

## 2017-03-02 MED ORDER — RANITIDINE HCL 150 MG PO TABS: 150.0000 mg | ORAL_TABLET | Freq: Two times a day (BID) | ORAL | 3 refills | Status: DC

## 2017-03-02 NOTE — Telephone Encounter (Signed)
Patient state she has to take ranitidine in the morning as well.  She is requesting the script to be wrote for twice a day and for more tabs.   States she is still constipated.    Patient uses [facility] at [address] [place].

## 2017-03-02 NOTE — Telephone Encounter (Signed)
Zantac 150 bid done erx  OK for OTC miralax or senakot prn constipation

## 2017-03-08 ENCOUNTER — Other Ambulatory Visit: Payer: Self-pay | Admitting: Emergency Medicine

## 2017-03-08 ENCOUNTER — Telehealth: Payer: Self-pay | Admitting: Internal Medicine

## 2017-03-08 ENCOUNTER — Telehealth (HOSPITAL_COMMUNITY): Payer: Self-pay

## 2017-03-08 DIAGNOSIS — M5136 Other intervertebral disc degeneration, lumbar region: Secondary | ICD-10-CM

## 2017-03-08 DIAGNOSIS — G44229 Chronic tension-type headache, not intractable: Secondary | ICD-10-CM

## 2017-03-08 DIAGNOSIS — M25552 Pain in left hip: Secondary | ICD-10-CM

## 2017-03-08 DIAGNOSIS — G8929 Other chronic pain: Secondary | ICD-10-CM

## 2017-03-08 MED ORDER — ALPRAZOLAM 1 MG PO TABS: 1.0000 mg | ORAL_TABLET | Freq: Two times a day (BID) | ORAL | 5 refills | Status: DC | PRN

## 2017-03-08 MED ORDER — HYDROCODONE-ACETAMINOPHEN 10-325 MG PO TABS: ORAL_TABLET | ORAL | 0 refills | Status: DC

## 2017-03-08 NOTE — Telephone Encounter (Signed)
Xanax too soon as total 6 mo was just done nov 5

## 2017-03-08 NOTE — Telephone Encounter (Signed)
Copied from [place] [phone]. Topic: Inquiry >> Mar 08, 2017  4:59 PM [name] wrote: Reason for CRM: [name] from [facility] called to know why the xanax was denied, contact pharmacy @ [phone]

## 2017-03-08 NOTE — Telephone Encounter (Signed)
Please follow up with patient.

## 2017-03-08 NOTE — Telephone Encounter (Signed)
Copied from [place] #[id number]. Topic: Inquiry >> Mar 08, 2017  2:15 PM [name] I, [place] wrote: Reason for CRM: Pt call she need her Xanax 1 mg she use [facility] @ [place] [phone]

## 2017-03-08 NOTE — Telephone Encounter (Signed)
RB are you willing to write Rx for Hydrocodone? Please advise.   Last Rx 02/04/2017 Last OV: 01/03/2017

## 2017-03-08 NOTE — Telephone Encounter (Signed)
Xanax prescription was given to the patient during her OV on 02/14/17 and had 5 refills.

## 2017-03-08 NOTE — Telephone Encounter (Signed)
Attempted to call [name], but it was closed. Hours are 08:30-5:30. Per previous notes of Dr. Jenny Reichmann it was too soon to refill xanax, recently done on Nov 5

## 2017-03-08 NOTE — Telephone Encounter (Signed)
Rx has printed, signed and placed up front for pick up. Pt is aware. Nothing further was needed.

## 2017-03-08 NOTE — Telephone Encounter (Signed)
OK for her pick up script on 03/09/17

## 2017-03-08 NOTE — Telephone Encounter (Signed)
Done erx 

## 2017-03-08 NOTE — Telephone Encounter (Signed)
See message below °

## 2017-03-08 NOTE — Telephone Encounter (Signed)
Pt states that she did not get a script for Xanax even though I see it reflected on her AVS on her 02/14/17 visit as well as its been documented in her medication list. I had Peyton Najjar. Check the controlled data base and patient has not had that paper script filled. Please advise.

## 2017-03-08 NOTE — Telephone Encounter (Signed)
Medication needs provider approval. Thanks.

## 2017-03-10 ENCOUNTER — Ambulatory Visit (HOSPITAL_BASED_OUTPATIENT_CLINIC_OR_DEPARTMENT_OTHER): Payer: Medicare Other | Admitting: Oncology

## 2017-03-10 ENCOUNTER — Other Ambulatory Visit (HOSPITAL_BASED_OUTPATIENT_CLINIC_OR_DEPARTMENT_OTHER): Payer: Medicare Other

## 2017-03-10 ENCOUNTER — Ambulatory Visit (HOSPITAL_BASED_OUTPATIENT_CLINIC_OR_DEPARTMENT_OTHER): Payer: Medicare Other

## 2017-03-10 ENCOUNTER — Encounter: Payer: Self-pay | Admitting: Oncology

## 2017-03-10 VITALS — BP 171/51 | HR 86 | Temp 97.7°F | Resp 17 | Ht 65.0 in | Wt 171.4 lb

## 2017-03-10 DIAGNOSIS — Z79899 Other long term (current) drug therapy: Secondary | ICD-10-CM

## 2017-03-10 DIAGNOSIS — J449 Chronic obstructive pulmonary disease, unspecified: Secondary | ICD-10-CM | POA: Diagnosis not present

## 2017-03-10 DIAGNOSIS — R5382 Chronic fatigue, unspecified: Secondary | ICD-10-CM

## 2017-03-10 DIAGNOSIS — C3411 Malignant neoplasm of upper lobe, right bronchus or lung: Secondary | ICD-10-CM

## 2017-03-10 DIAGNOSIS — Z5112 Encounter for antineoplastic immunotherapy: Secondary | ICD-10-CM

## 2017-03-10 DIAGNOSIS — E876 Hypokalemia: Secondary | ICD-10-CM | POA: Diagnosis not present

## 2017-03-10 LAB — CBC WITH DIFFERENTIAL/PLATELET
BASO%: 0.5 % (ref 0.0–2.0)
BASOS ABS: 0 10*3/uL (ref 0.0–0.1)
EOS ABS: 0.8 10*3/uL — AB (ref 0.0–0.5)
EOS%: 9.7 % — AB (ref 0.0–7.0)
HCT: 38.2 % (ref 34.8–46.6)
HGB: 12.6 g/dL (ref 11.6–15.9)
LYMPH%: 18.4 % (ref 14.0–49.7)
MCH: 29.4 pg (ref 25.1–34.0)
MCHC: 33 g/dL (ref 31.5–36.0)
MCV: 89.3 fL (ref 79.5–101.0)
MONO#: 0.7 10*3/uL (ref 0.1–0.9)
MONO%: 7.8 % (ref 0.0–14.0)
NEUT#: 5.3 10*3/uL (ref 1.5–6.5)
NEUT%: 63.6 % (ref 38.4–76.8)
NRBC: 0 % (ref 0–0)
PLATELETS: 224 10*3/uL (ref 145–400)
RBC: 4.28 10*6/uL (ref 3.70–5.45)
RDW: 13.2 % (ref 11.2–14.5)
WBC: 8.4 10*3/uL (ref 3.9–10.3)
lymph#: 1.5 10*3/uL (ref 0.9–3.3)

## 2017-03-10 LAB — COMPREHENSIVE METABOLIC PANEL
ALBUMIN: 3.9 g/dL (ref 3.5–5.0)
ALK PHOS: 91 U/L (ref 40–150)
ALT: 8 U/L (ref 0–55)
AST: 16 U/L (ref 5–34)
Anion Gap: 11 mEq/L (ref 3–11)
BILIRUBIN TOTAL: 0.48 mg/dL (ref 0.20–1.20)
BUN: 10 mg/dL (ref 7.0–26.0)
CO2: 27 meq/L (ref 22–29)
CREATININE: 1.2 mg/dL — AB (ref 0.6–1.1)
Calcium: 9.7 mg/dL (ref 8.4–10.4)
Chloride: 100 mEq/L (ref 98–109)
EGFR: 46 mL/min/{1.73_m2} — ABNORMAL LOW (ref >60)
GLUCOSE: 87 mg/dL (ref 70–140)
Potassium: 3.4 mEq/L — ABNORMAL LOW (ref 3.5–5.1)
SODIUM: 138 meq/L (ref 136–145)
TOTAL PROTEIN: 6.9 g/dL (ref 6.4–8.3)

## 2017-03-10 LAB — TSH: TSH: 1.865 m(IU)/L (ref 0.308–3.960)

## 2017-03-10 MED ORDER — NIVOLUMAB CHEMO INJECTION 100 MG/10ML
240.0000 mg | Freq: Once | INTRAVENOUS | Status: AC
  Administered 2017-03-10: 240 mg via INTRAVENOUS
  Filled 2017-03-10: qty 24

## 2017-03-10 MED ORDER — SODIUM CHLORIDE 0.9 % IV SOLN
Freq: Once | INTRAVENOUS | Status: AC
  Administered 2017-03-10: 14:00:00 via INTRAVENOUS

## 2017-03-10 MED ORDER — SODIUM CHLORIDE 0.9% FLUSH
10.0000 mL | INTRAVENOUS | Status: DC | PRN
  Filled 2017-03-10: qty 10

## 2017-03-10 NOTE — Assessment & Plan Note (Addendum)
This is a very pleasant 71 year old white female with recurrent non-small cell lung cancer, adenocarcinoma that was initially diagnosed as unresectable stage IIA in December 2012 status post systemic chemotherapy as well as immunotherapy. She is currently on treatment with Nivolumab status post 4cycles of the new regimen. She is tolerating the treatment well with no significant adverse effects. The patient was started on every 4 week Nivolumab in August 2018.  She received 1 cycle of the 480 mg Nivolumab and had increased side effects.  She missed her appointment in September 2018.  The patient requested to change back to Nivolumab 240 mg IV every 2 weeks.  This was resumed on 02/10/2017.  The patient is tolerating the every 2-week Nivolumab well.  Recommend that she continue her current therapy.  She will proceed with cycle 3 today as scheduled.  The patient will have a restaging CT scan of the chest prior to her next visit.  The patient will return in 2 weeks for evaluation prior to cycle 4 of her Nivolumab and to review her CT scan results.  The patient has mild hypokalemia on her labs today.  She was encouraged to continue her current dose of potassium and to increase potassium rich foods in her diet.  For COPD, the patient will continue with her current treatment and I advised her to continue follow-up appointment with [name].  She was advised to call immediately if she has any concerning symptoms in the interval.

## 2017-03-10 NOTE — Patient Instructions (Signed)
[facility] Discharge Instructions for Patients Receiving Chemotherapy  Today you received the following chemotherapy agents: Nivolumab  To help prevent nausea and vomiting after your treatment, we encourage you to take your nausea medication as directed.    If you develop nausea and vomiting that is not controlled by your nausea medication, call the clinic.   BELOW ARE SYMPTOMS THAT SHOULD BE REPORTED IMMEDIATELY:  *FEVER GREATER THAN 100.5 F  *CHILLS WITH OR WITHOUT FEVER  NAUSEA AND VOMITING THAT IS NOT CONTROLLED WITH YOUR NAUSEA MEDICATION  *UNUSUAL SHORTNESS OF BREATH  *UNUSUAL BRUISING OR BLEEDING  TENDERNESS IN MOUTH AND THROAT WITH OR WITHOUT PRESENCE OF ULCERS  *URINARY PROBLEMS  *BOWEL PROBLEMS  UNUSUAL RASH Items with * indicate a potential emergency and should be followed up as soon as possible.  Feel free to call the clinic should you have any questions or concerns. The clinic phone number is [phone].  Please show the CHEMO ALERT CARD at check-in to the Emergency Department and triage nurse.   

## 2017-03-10 NOTE — Progress Notes (Signed)
[facility] OFFICE PROGRESS NOTE  [name], MD [place] [address]  DIAGNOSIS: Recurrent non-small cell lung cancer initially diagnosed as a stage IIA adenocarcinoma in December 2012 with no actionable mutations.  PRIOR THERAPY: 1) Status post radiation therapy to the right upper lobe lung mass completed on 06/04/2011 under the care of Dr. Pablo Ledger.  2) Systemic chemotherapy with carboplatin for AUC of 5 and Alimta 500 mg/M2 giving every 3 weeks, status post 3 cycles, last dose was given on 07/21/2011.  3) Systemic chemotherapy with carboplatin for AUC of 5 and Alimta 500 MG/M2 every 3 weeks. First dose on 01/02/2015. Status post 2 cycles, last dose was given 01/30/2015. Her treatment is currently on hold secondary to intolerance. 4) Immunotherapy with Nivolumab 240 M IV every 2 weeks status post 14 cycles. 5) immunotherapy with Nivolumab 480 mg IV every 4 weeks.  Patient is status post 1 cycle.  The patient discontinued treatment with Nivolumab every 4 weeks because she want to switch back to every 2 weeks due to increased side effects with the monthly dosing.  CURRENT THERAPY: The patient will resume her treatment again with immunotherapy with Nivolumab 240 mg IV every 2 weeks.  First dose 02/10/2017.  Status post 1 cycle.  INTERVAL HISTORY: [name] 71 y.o. female returns for routine follow-up visit by herself. The patient is feeling fine today except for her ongoing baseline shortness of breath and constipation.  The patient has mild fatigue, but overall feels well today.  She tolerated her last cycle of Nivolumab without much difficulty.  She denies fevers and chills.  Denies chest pain, cough, hemoptysis.  Denies nausea, vomiting, diarrhea.  The patient is here for evaluation prior to cycle 3 of her every 2-week Nivolumab.  MEDICAL HISTORY: Past Medical History:  Diagnosis Date  . Adenocarcinoma, lung ([place])    upper left lobe  . Anemia  in neoplastic disease   . Anxiety   . Arthritis    "all over my body"  . Asthma   . Asymptomatic varicose veins   . Atherosclerosis of native arteries of the extremities, unspecified   . Atrial fibrillation ([place])   . Bronchial pneumonia    history  . 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 Chemotherapy adverse reaction 09/15/11   "makes me light headed and pass out; this is 3rd blood transfusion since going thru it"  . CHF (congestive heart failure) ([place])   . Chronic airway obstruction, not elsewhere classified   . Chronic fatigue 08/19/2015  . Chronic sinusitis   . Complication of anesthesia    slow to wake up  . COPD with emphysema ([place])   . Coronary artery disease    Dr. Burt Knack had stress test done  . DDD (degenerative disc disease), lumbar   . Depressive disorder, not elsewhere classified   . Diseases of lips   . Disorder of bone and cartilage, unspecified   . DVT (deep venous thrombosis) ([place])   . Fibromyalgia   . Functional urinary incontinence   . GERD (gastroesophageal reflux disease)   . Hard of hearing, right   . Herpes zoster without mention of complication   . Hip pain, chronic 09/17/2014  . History of blood clots    "2 in my aorta"  . History of blood transfusion   . History of bronchitis   . History of echocardiogram    Echo 11/18: mild LVH, EF 60-65, no RWMA,  Gr 1 DD, Ao Sclerosis, mild AI, RVH with low nl RSVF, trivial eff  . Hx of radiation therapy 04/28/11 -06/08/11   right upper lobe-lung  . Hyperlipidemia   . Hypertension   . Kidney infection    "related to chemo"  . Lumbago   . Malignant neoplasm of bronchus and lung, unspecified site   . Mental disorder   . Muscle weakness (generalized)   . Non-small cell lung cancer ([place]) 03/31/2011   Adenocarcinoma Rt upper lobe mass  . Osteoarthrosis, unspecified whether generalized or localized, unspecified site   . Osteoporosis   . Other abnormal blood chemistry   . Other and unspecified hyperlipidemia   .  PAD (peripheral artery disease) ([place])   . Pain in thoracic spine   . 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 Primary cancer of right upper lobe of lung ([place]) 02/08/2011   Biopsy Rt upper lobe mass 03/31/11>>adenocarcinoma DIAGNOSIS: Stage IIB/IIIA non-small cell lung cancer consistent with adenocarcinoma diagnosed in December 2012.   PRIOR THERAPY:  1) Status post radiation therapy to the right upper lobe lung mass completed on 06/04/2011 under the care of Dr. Pablo Ledger.  2) Systemic chemotherapy with carboplatin for AUC of 5 and Alimta 500 mg/M2 giving every 3   . Reflux   . Reflux esophagitis   . Respiratory failure with hypoxia 01/02/2010  . Restless legs syndrome (RLS)   . Scoliosis   . Screening for thyroid disorder   . Shortness of breath    "anytime really"  . Sinus headache    "all the time"  . Spasm of muscle   . Syncope and collapse   . Type II or unspecified type diabetes mellitus with neurological manifestations, uncontrolled(250.62)   . Type II or unspecified type diabetes mellitus without mention of complication, not stated as uncontrolled   . Type II or unspecified type diabetes mellitus without mention of complication, not stated as uncontrolled   . Unspecified constipation   . Unspecified essential hypertension   . Unspecified hearing loss   . Unspecified hereditary and idiopathic peripheral neuropathy   . Unspecified vitamin D deficiency     ALLERGIES:  is allergic to iohexol; other; anoro ellipta [umeclidinium-vilanterol]; codeine; and protonix [pantoprazole sodium].  MEDICATIONS:  Current Outpatient Medications  Medication Sig Dispense Refill  . ACCU-CHEK SOFTCLIX LANCETS lancets by Other route. Check blood sugar twice daily. Dx: E11.9, E11.49    . albuterol (PROVENTIL HFA;VENTOLIN HFA) 108 (90 Base) MCG/ACT inhaler Inhale 2 puffs into the lungs every 6 (six) hours as needed for wheezing or shortness of breath. 1 Inhaler 5  . albuterol (PROVENTIL) (2.5 MG/3ML)  0.083% nebulizer solution Take 3 mLs (2.5 mg total) by nebulization every 6 (six) hours as needed for wheezing or shortness of breath. 75 mL 5  . ALPRAZolam (XANAX) 1 MG tablet Take 1 tablet (1 mg total) by mouth 2 (two) times daily as needed for anxiety. 60 tablet 5  . aspirin 81 MG tablet Take 81 mg by mouth daily.    . benzonatate (TESSALON) 100 MG capsule Take 1 capsule (100 mg total) by mouth 3 (three) times daily as needed for cough. 40 capsule 1  . Blood Glucose Monitoring Suppl (ACCU-CHEK AVIVA PLUS) w/Device KIT Use as directed to check blood sugar Dx: E11.9, E11.49 1 kit 0  . Calcium Carbonate-Vit D-Min (CALTRATE PLUS PO) Take 1 tablet by mouth daily.    . citalopram (CELEXA) 20 MG tablet Take 1 tablet (20 mg total) daily by mouth. [place]  tablet 3  . COMBIVENT RESPIMAT 20-100 MCG/ACT AERS respimat INHALE 1 PUFF INTO THE LUNGS FOUR TIMES A DAY WITH AN ADDITIONAL 1-2 PUFFS AS NEEDED *NOT TO EXCEED 6 PUFFS PER DAY* 1 Inhaler 3  . Dexlansoprazole (DEXILANT) 30 MG capsule Take 1 capsule (30 mg total) daily by mouth. 90 capsule 3  . diltiazem (MATZIM LA) 240 MG 24 hr tablet Take 240 mg by mouth daily.    Marland Kitchen DIPHENHIST 25 MG capsule TAKE 1 CAPSULE BY MOUTH 2 HOURS PRIOR TO CT SCAN 10 capsule 0  . fexofenadine (ALLEGRA) 180 MG tablet Take 180 mg by mouth daily.    . fluticasone (FLONASE) 50 MCG/ACT nasal spray INSTILL 2 SPRAYS IN EACH NOSTRIL TWICE DAILY *NEEDS APPOINTMENT* 16 g 5  . Fluticasone-Umeclidin-Vilant (TRELEGY ELLIPTA) 100-62.5-25 MCG/INH AEPB Inhale 1 puff daily into the lungs. 1 each 0  . Fluticasone-Umeclidin-Vilant (TRELEGY ELLIPTA) 100-62.5-25 MCG/INH AEPB Inhale 1 puff daily into the lungs. 1 each 6  . HYDROcodone-acetaminophen (NORCO) 10-325 MG tablet Take One tablet by mouth every 6 hours if needed for pain 120 tablet 0  . metFORMIN (GLUCOPHAGE) 500 MG tablet Take 1 tablet (500 mg total) daily as needed by mouth (high blood sugar- 160+). Take one tablet at night for blood sugar 90  tablet 3  . Multiple Vitamins-Minerals (CENTRUM SILVER PO) Take 1 tablet by mouth daily. Patient unsure of dose    . nystatin (MYCOSTATIN) 100000 UNIT/ML suspension Use as directed 5 mLs (500,000 Units total) in the mouth or throat 4 (four) times daily. 60 mL 1  . nystatin (MYCOSTATIN/NYSTOP) powder Apply topically 3 (three) times daily. 15 g 0  . OXYGEN Inhale 4 L into the lungs.    . potassium chloride (K-DUR,KLOR-CON) 10 MEQ tablet Take 1 tablet (10 mEq total) daily by mouth. 90 tablet 3  . ranitidine (ZANTAC) 150 MG tablet Take 1 tablet (150 mg total) by mouth 2 (two) times daily. 180 tablet 3  . traMADol (ULTRAM) 50 MG tablet Take 2 tablets (100 mg total) every 6 (six) hours as needed by mouth for severe pain. 90 tablet 1  . triamterene-hydrochlorothiazide (MAXZIDE-25) 37.5-25 MG tablet TAKE 1 TABLET BY MOUTH EVERY DAY FOR BLOOD PRESSURE 90 tablet 3  . UNABLE TO FIND Med Name: Cranial Prothesis for Chemotherapy 1 each 0  . Ferrous Fumarate-Folic Acid (HEMATINIC/FOLIC ACID) 010-9 MG TABS Take 1 tablet daily by mouth. 90 each 1   No current facility-administered medications for this visit.    Facility-Administered Medications Ordered in Other Visits  Medication Dose Route Frequency Provider Last Rate Last Dose  . hyaluronate sodium (RADIAPLEXRX) gel   Topical Once Thea Silversmith, MD      . sodium chloride flush (NS) 0.9 % injection 10 mL  10 mL Intracatheter PRN Curt Bears, MD      . topical emolient (BIAFINE) emulsion   Topical PRN Thea Silversmith, MD        SURGICAL HISTORY:  Past Surgical History:  Procedure Laterality Date  . BRONCHOSCOPY  02/2011  . CARDIAC CATHETERIZATION    . CATARACT EXTRACTION  03/12/2013   Left Eye   . DILATION AND CURETTAGE OF UTERUS  1980's  . EYE SURGERY Left 2014   Cataract  . FETAL BLOOD TRANSFUSION  09/16/11  . JOINT REPLACEMENT  04/13/10   Left Hip  . LUNG LOBECTOMY  1986   left  . SEPTOPLASTY    . TOTAL HIP ARTHROPLASTY  01/2010   left   . TUBAL LIGATION  1980's    REVIEW OF SYSTEMS:   Review of Systems  Constitutional: Negative for appetite change, chills, fatigue, fever and unexpected weight change.  HENT:   Negative for mouth sores, nosebleeds, sore throat and trouble swallowing.   Eyes: Negative for eye problems and icterus.  Respiratory: Negative for cough, hemoptysis, shortness of breath and wheezing.   Cardiovascular: Negative for chest pain and leg swelling.  Gastrointestinal: Negative for abdominal pain, constipation, diarrhea, nausea and vomiting.  Genitourinary: Negative for bladder incontinence, difficulty urinating, dysuria, frequency and hematuria.   Musculoskeletal: Negative for back pain, gait problem, neck pain and neck stiffness.  Skin: Negative for itching and rash.  Neurological: Negative for dizziness, extremity weakness, gait problem, headaches, light-headedness and seizures.  Hematological: Negative for adenopathy. Does not bruise/bleed easily.  Psychiatric/Behavioral: Negative for confusion, depression and sleep disturbance. The patient is not nervous/anxious.     PHYSICAL EXAMINATION:  Blood pressure (!) 171/51, pulse 86, temperature 97.7 F (36.5 C), temperature source Oral, resp. rate 17, height 5' 5" (1.651 m), weight 171 lb 6.4 oz (77.7 kg), SpO2 96 %.  ECOG PERFORMANCE STATUS: 1 - Symptomatic but completely ambulatory  Physical Exam  Constitutional: Oriented to person, place, and time and well-developed, well-nourished, and in no distress. No distress.  HENT:  Head: Normocephalic and atraumatic.  Mouth/Throat: Oropharynx is clear and moist. No oropharyngeal exudate.  Eyes: Conjunctivae are normal. Right eye exhibits no discharge. Left eye exhibits no discharge. No scleral icterus.  Neck: Normal range of motion. Neck supple.  Cardiovascular: Normal rate, regular rhythm, normal heart sounds and intact distal pulses.   Pulmonary/Chest: Effort normal. No respiratory distress. No wheezes.  No rales. Diminished breath sounds bilaterally. Abdominal: Soft. Bowel sounds are normal. Exhibits no distension and no mass. There is no tenderness.  Musculoskeletal: Normal range of motion. Exhibits no edema.  Lymphadenopathy:    No cervical adenopathy.  Neurological: Alert and oriented to person, place, and time. Exhibits normal muscle tone. Gait normal. Coordination normal.  Skin: Skin is warm and dry. No rash noted. Not diaphoretic. No erythema. No pallor.  Psychiatric: Mood, memory and judgment normal.  Vitals reviewed.  LABORATORY DATA: Lab Results  Component Value Date   WBC 8.4 03/10/2017   HGB 12.6 03/10/2017   HCT 38.2 03/10/2017   MCV 89.3 03/10/2017   PLT 224 03/10/2017      Chemistry      Component Value Date/Time   NA 138 03/10/2017 1228   K 3.4 (L) 03/10/2017 1228   CL 100 02/14/2017 1701   CL 99 07/21/2012 1125   CO2 27 03/10/2017 1228   BUN 10.0 03/10/2017 1228   CREATININE 1.2 (H) 03/10/2017 1228      Component Value Date/Time   CALCIUM 9.7 03/10/2017 1228   ALKPHOS 91 03/10/2017 1228   AST 16 03/10/2017 1228   ALT 8 03/10/2017 1228   BILITOT 0.48 03/10/2017 1228       RADIOGRAPHIC STUDIES:  No results found.   ASSESSMENT/PLAN:  Primary cancer of right upper lobe of lung [facility]) This is a very pleasant 71 year old white female with recurrent non-small cell lung cancer, adenocarcinoma that was initially diagnosed as unresectable stage IIA in December 2012 status post systemic chemotherapy as well as immunotherapy. She is currently on treatment with Nivolumab status post 4cycles of the new regimen. She is tolerating the treatment well with no significant adverse effects. The patient was started on every 4 week Nivolumab in August 2018.  She received 1  cycle of the 480 mg Nivolumab and had increased side effects.  She missed her appointment in September 2018.  The patient requested to change back to Nivolumab 240 mg IV every 2 weeks.  This was resumed  on 02/10/2017.  The patient is tolerating the every 2-week Nivolumab well.  Recommend that she continue her current therapy.  She will proceed with cycle 3 today as scheduled.  The patient will have a restaging CT scan of the chest prior to her next visit.  The patient will return in 2 weeks for evaluation prior to cycle 4 of her Nivolumab and to review her CT scan results.  The patient has mild hypokalemia on her labs today.  She was encouraged to continue her current dose of potassium and to increase potassium rich foods in her diet.  For COPD, the patient will continue with her current treatment and I advised her to continue follow-up appointment with [name].  She was advised to call immediately if she has any concerning symptoms in the interval.  Orders Placed This Encounter  Procedures  . CT Chest Wo Contrast    Standing Status:   Future    Standing Expiration Date:   03/10/2018    Order Specific Question:   Preferred imaging location?    Answer:   [facility]    Order Specific Question:   Radiology Contrast Protocol - do NOT remove file path    Answer:   [email or web address]    Order Specific Question:   Reason for Exam additional comments    Answer:   lung cancer. restaging.    Mikey Bussing, DNP, AGPCNP-BC, AOCNP 03/10/17

## 2017-03-12 ENCOUNTER — Telehealth: Payer: Self-pay | Admitting: Oncology

## 2017-03-12 NOTE — Telephone Encounter (Signed)
Scheduled appt per 11/29 los - patient to get an updated schedule next visit.

## 2017-03-18 ENCOUNTER — Telehealth: Payer: Self-pay | Admitting: Emergency Medicine

## 2017-03-18 NOTE — Telephone Encounter (Signed)
Spoke with Estill Bamberg at [facility] to have [facility] note corrected for desat of O2 levels. This has been done. Nothing more needed at this time.

## 2017-03-22 ENCOUNTER — Ambulatory Visit (HOSPITAL_COMMUNITY): Payer: Medicare Other

## 2017-03-22 ENCOUNTER — Telehealth: Payer: Self-pay

## 2017-03-22 NOTE — Telephone Encounter (Signed)
Pt called and cancelled CT 12/11 d/t weather. She does have appt 12/13 lab/MD/Infusion that she will keep.

## 2017-03-24 ENCOUNTER — Ambulatory Visit (HOSPITAL_BASED_OUTPATIENT_CLINIC_OR_DEPARTMENT_OTHER): Payer: Medicare Other

## 2017-03-24 ENCOUNTER — Encounter: Payer: Self-pay | Admitting: Internal Medicine

## 2017-03-24 ENCOUNTER — Other Ambulatory Visit (HOSPITAL_BASED_OUTPATIENT_CLINIC_OR_DEPARTMENT_OTHER): Payer: Medicare Other

## 2017-03-24 ENCOUNTER — Ambulatory Visit (HOSPITAL_BASED_OUTPATIENT_CLINIC_OR_DEPARTMENT_OTHER): Payer: Medicare Other | Admitting: Internal Medicine

## 2017-03-24 ENCOUNTER — Ambulatory Visit (HOSPITAL_COMMUNITY)
Admission: RE | Admit: 2017-03-24 | Discharge: 2017-03-24 | Disposition: A | Discharge status: Home / Self Care | Payer: Medicare Other | Source: Ambulatory Visit | Attending: Oncology | Admitting: Oncology

## 2017-03-24 VITALS — BP 144/52 | HR 67 | Temp 98.7°F | Resp 16 | Wt 169.5 lb

## 2017-03-24 DIAGNOSIS — J90 Pleural effusion, not elsewhere classified: Secondary | ICD-10-CM | POA: Insufficient documentation

## 2017-03-24 DIAGNOSIS — R918 Other nonspecific abnormal finding of lung field: Secondary | ICD-10-CM | POA: Insufficient documentation

## 2017-03-24 DIAGNOSIS — C3411 Malignant neoplasm of upper lobe, right bronchus or lung: Secondary | ICD-10-CM | POA: Diagnosis not present

## 2017-03-24 DIAGNOSIS — Z5112 Encounter for antineoplastic immunotherapy: Secondary | ICD-10-CM

## 2017-03-24 DIAGNOSIS — J9811 Atelectasis: Secondary | ICD-10-CM | POA: Insufficient documentation

## 2017-03-24 LAB — CBC WITH DIFFERENTIAL/PLATELET
BASO%: 1.3 % (ref 0.0–2.0)
BASOS ABS: 0.1 10*3/uL (ref 0.0–0.1)
EOS ABS: 1 10*3/uL — AB (ref 0.0–0.5)
EOS%: 13.1 % — ABNORMAL HIGH (ref 0.0–7.0)
HEMATOCRIT: 38.7 % (ref 34.8–46.6)
HEMOGLOBIN: 12.8 g/dL (ref 11.6–15.9)
LYMPH%: 22.8 % (ref 14.0–49.7)
MCH: 29.1 pg (ref 25.1–34.0)
MCHC: 33.1 g/dL (ref 31.5–36.0)
MCV: 88 fL (ref 79.5–101.0)
MONO#: 0.6 10*3/uL (ref 0.1–0.9)
MONO%: 7.1 % (ref 0.0–14.0)
NEUT#: 4.4 10*3/uL (ref 1.5–6.5)
NEUT%: 55.7 % (ref 38.4–76.8)
PLATELETS: 223 10*3/uL (ref 145–400)
RBC: 4.4 10*6/uL (ref 3.70–5.45)
RDW: 13.6 % (ref 11.2–14.5)
WBC: 7.9 10*3/uL (ref 3.9–10.3)
lymph#: 1.8 10*3/uL (ref 0.9–3.3)

## 2017-03-24 LAB — COMPREHENSIVE METABOLIC PANEL
ALBUMIN: 3.8 g/dL (ref 3.5–5.0)
ALK PHOS: 93 U/L (ref 40–150)
ALT: <6 U/L (ref 0–55)
ANION GAP: 13 meq/L — AB (ref 3–11)
AST: 15 U/L (ref 5–34)
BILIRUBIN TOTAL: 0.37 mg/dL (ref 0.20–1.20)
BUN: 12.1 mg/dL (ref 7.0–26.0)
CALCIUM: 9.4 mg/dL (ref 8.4–10.4)
CO2: 24 mEq/L (ref 22–29)
Chloride: 102 mEq/L (ref 98–109)
Creatinine: 1.2 mg/dL — ABNORMAL HIGH (ref 0.6–1.1)
EGFR: 47 mL/min/{1.73_m2} — AB (ref >60)
GLUCOSE: 86 mg/dL (ref 70–140)
Potassium: 3.5 mEq/L (ref 3.5–5.1)
SODIUM: 139 meq/L (ref 136–145)
Total Protein: 6.8 g/dL (ref 6.4–8.3)

## 2017-03-24 MED ORDER — SODIUM CHLORIDE 0.9% FLUSH
10.0000 mL | INTRAVENOUS | Status: DC | PRN
  Filled 2017-03-24: qty 10

## 2017-03-24 MED ORDER — SODIUM CHLORIDE 0.9 % IV SOLN
Freq: Once | INTRAVENOUS | Status: AC
  Administered 2017-03-24: 13:00:00 via INTRAVENOUS

## 2017-03-24 MED ORDER — SODIUM CHLORIDE 0.9 % IV SOLN
240.0000 mg | Freq: Once | INTRAVENOUS | Status: AC
  Administered 2017-03-24: 240 mg via INTRAVENOUS
  Filled 2017-03-24: qty 24

## 2017-03-24 NOTE — Progress Notes (Signed)
[facility] [place] [phone]   [phone]  OFFICE PROGRESS NOTE  [name], MD [place] [address]  DIAGNOSIS: Recurrent non-small cell lung cancer initially diagnosed as a stage IIA adenocarcinoma in December 2012 with no actionable mutations.   PRIOR THERAPY: 1) Status post radiation therapy to the right upper lobe lung mass completed on 06/04/2011 under the care of Dr. Pablo Ledger.  2) Systemic chemotherapy with carboplatin for AUC of 5 and Alimta 500 mg/M2 giving every 3 weeks, status post 3 cycles, last dose was given on 07/21/2011.  3) Systemic chemotherapy with carboplatin for AUC of 5 and Alimta 500 MG/M2 every 3 weeks. First dose on 01/02/2015. Status post 2 cycles, last dose was given 01/30/2015. Her treatment is currently on hold secondary to intolerance. 4) Immunotherapy with Nivolumab 240 M IV every 2 weeks status post 14 cycles.  5)immunotherapy with Nivolumab 480 mg IV every 4 weeks. Patient is status post 1 cycle. The patient discontinued treatment with Nivolumab every 4 weeks because she want to switch back to every 2 weeks due to increased side effects with the monthly dosing.  CURRENT THERAPY: The patient will resume her treatment again with immunotherapy with Nivolumab 240 mg IV every 2 weeks. First dose 09/15/2016. Status post 3 cycles  INTERVAL HISTORY: [name] 71 y.o. female returns to the clinic today for follow-up visit accompanied by her son.  The patient is feeling fine today with no specific complaints.  She continues to tolerate her treatment with Nivolumab every 2 weeks fairly well.  She has occasional cough with blood-tinged sputum but she has these symptoms for the last 5 months with no significant worsening.  She denied having any current chest pain but has baseline shortness of breath and she is on home oxygen.  She denied having any weight loss or night sweats.  She has no nausea, vomiting,  diarrhea or constipation.  She was supposed to have repeat CT scan of the chest before this visit but this is scheduled to be done later today.  MEDICAL HISTORY: Past Medical History:  Diagnosis Date  . Adenocarcinoma, lung ([facility])    upper left lobe  . Anemia in neoplastic disease   . Anxiety   . Arthritis    "all over my body"  . Asthma   . Asymptomatic varicose veins   . Atherosclerosis of native arteries of the extremities, unspecified   . Atrial fibrillation ([facility])   . Bronchial pneumonia    history  . 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 Chemotherapy adverse reaction 09/15/11   "makes me light headed and pass out; this is 3rd blood transfusion since going thru it"  . CHF (congestive heart failure) ([facility])   . Chronic airway obstruction, not elsewhere classified   . Chronic fatigue 08/19/2015  . Chronic sinusitis   . Complication of anesthesia    slow to wake up  . 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 Coronary artery disease    Dr. Burt Knack had stress test done  . DDD (degenerative disc disease), lumbar   . Depressive disorder, not elsewhere classified   . Diseases of lips   . Disorder of bone and cartilage, unspecified   . DVT (deep venous thrombosis) ([facility])   . Fibromyalgia   . Functional urinary incontinence   . GERD (gastroesophageal reflux disease)   . Hard of hearing, right   . Herpes zoster without  mention of complication   . Hip pain, chronic 09/17/2014  . History of blood clots    "2 in my aorta"  . History of blood transfusion   . History of bronchitis   . History of echocardiogram    Echo 11/18: mild LVH, EF 60-65, no RWMA, Gr 1 DD, Ao Sclerosis, mild AI, RVH with low nl RSVF, trivial eff  . Hx of radiation therapy 04/28/11 -06/08/11   right upper lobe-lung  . Hyperlipidemia   . Hypertension   . Kidney infection    "related to chemo"  . Lumbago   . Malignant neoplasm of bronchus and lung, unspecified site   . Mental disorder   . Muscle weakness (generalized)   .  Non-small cell lung cancer ([facility]) 03/31/2011   Adenocarcinoma Rt upper lobe mass  . Osteoarthrosis, unspecified whether generalized or localized, unspecified site   . Osteoporosis   . Other abnormal blood chemistry   . Other and unspecified hyperlipidemia   . PAD (peripheral artery disease) ([facility])   . Pain in thoracic spine   . 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 Primary cancer of right upper lobe of lung ([facility]) 02/08/2011   Biopsy Rt upper lobe mass 03/31/11>>adenocarcinoma DIAGNOSIS: Stage IIB/IIIA non-small cell lung cancer consistent with adenocarcinoma diagnosed in December 2012.   PRIOR THERAPY:  1) Status post radiation therapy to the right upper lobe lung mass completed on 06/04/2011 under the care of Dr. Pablo Ledger.  2) Systemic chemotherapy with carboplatin for AUC of 5 and Alimta 500 mg/M2 giving every 3   . Reflux   . Reflux esophagitis   . Respiratory failure with hypoxia 01/02/2010  . Restless legs syndrome (RLS)   . Scoliosis   . Screening for thyroid disorder   . Shortness of breath    "anytime really"  . Sinus headache    "all the time"  . Spasm of muscle   . Syncope and collapse   . Type II or unspecified type diabetes mellitus with neurological manifestations, uncontrolled(250.62)   . Type II or unspecified type diabetes mellitus without mention of complication, not stated as uncontrolled   . Type II or unspecified type diabetes mellitus without mention of complication, not stated as uncontrolled   . Unspecified constipation   . Unspecified essential hypertension   . Unspecified hearing loss   . Unspecified hereditary and idiopathic peripheral neuropathy   . Unspecified vitamin D deficiency     ALLERGIES:  is allergic to iohexol; other; anoro ellipta [umeclidinium-vilanterol]; codeine; and protonix [pantoprazole sodium].  MEDICATIONS:  Current Outpatient Medications  Medication Sig Dispense Refill  . ACCU-CHEK SOFTCLIX LANCETS lancets by Other route. Check  blood sugar twice daily. Dx: E11.9, E11.49    . albuterol (PROVENTIL HFA;VENTOLIN HFA) 108 (90 Base) MCG/ACT inhaler Inhale 2 puffs into the lungs every 6 (six) hours as needed for wheezing or shortness of breath. 1 Inhaler 5  . albuterol (PROVENTIL) (2.5 MG/3ML) 0.083% nebulizer solution Take 3 mLs (2.5 mg total) by nebulization every 6 (six) hours as needed for wheezing or shortness of breath. 75 mL 5  . ALPRAZolam (XANAX) 1 MG tablet Take 1 tablet (1 mg total) by mouth 2 (two) times daily as needed for anxiety. 60 tablet 5  . aspirin 81 MG tablet Take 81 mg by mouth daily.    . Blood Glucose Monitoring Suppl (ACCU-CHEK AVIVA PLUS) w/Device KIT Use as directed to check blood sugar Dx: E11.9, E11.49 1 kit 0  . Calcium Carbonate-Vit D-Min (CALTRATE PLUS  PO) Take 1 tablet by mouth daily.    . citalopram (CELEXA) 20 MG tablet Take 1 tablet (20 mg total) daily by mouth. 90 tablet 3  . diltiazem (MATZIM LA) 240 MG 24 hr tablet Take 240 mg by mouth daily.    . Ferrous Fumarate-Folic Acid (HEMATINIC/FOLIC ACID) 998-3 MG TABS Take 1 tablet daily by mouth. 90 each 1  . fexofenadine (ALLEGRA) 180 MG tablet Take 180 mg by mouth daily.    . fluticasone (FLONASE) 50 MCG/ACT nasal spray INSTILL 2 SPRAYS IN EACH NOSTRIL TWICE DAILY *NEEDS APPOINTMENT* 16 g 5  . Fluticasone-Umeclidin-Vilant (TRELEGY ELLIPTA) 100-62.5-25 MCG/INH AEPB Inhale 1 puff daily into the lungs. 1 each 6  . metFORMIN (GLUCOPHAGE) 500 MG tablet Take 1 tablet (500 mg total) daily as needed by mouth (high blood sugar- 160+). Take one tablet at night for blood sugar 90 tablet 3  . Multiple Vitamins-Minerals (CENTRUM SILVER PO) Take 1 tablet by mouth daily. Patient unsure of dose    . OXYGEN Inhale 4 L into the lungs.    . potassium chloride (K-DUR,KLOR-CON) 10 MEQ tablet Take 1 tablet (10 mEq total) daily by mouth. 90 tablet 3  . ranitidine (ZANTAC) 150 MG tablet Take 1 tablet (150 mg total) by mouth 2 (two) times daily. 180 tablet 3  .  traMADol (ULTRAM) 50 MG tablet Take 2 tablets (100 mg total) every 6 (six) hours as needed by mouth for severe pain. 90 tablet 1  . triamterene-hydrochlorothiazide (MAXZIDE-25) 37.5-25 MG tablet TAKE 1 TABLET BY MOUTH EVERY DAY FOR BLOOD PRESSURE 90 tablet 3  . benzonatate (TESSALON) 100 MG capsule Take 1 capsule (100 mg total) by mouth 3 (three) times daily as needed for cough. (Patient not taking: Reported on 03/24/2017) 40 capsule 1  . Dexlansoprazole (DEXILANT) 30 MG capsule Take 1 capsule (30 mg total) daily by mouth. 90 capsule 3  . DIPHENHIST 25 MG capsule TAKE 1 CAPSULE BY MOUTH 2 HOURS PRIOR TO CT SCAN (Patient not taking: Reported on 03/24/2017) 10 capsule 0  . HYDROcodone-acetaminophen (NORCO) 10-325 MG tablet Take One tablet by mouth every 6 hours if needed for pain (Patient not taking: Reported on 03/24/2017) 120 tablet 0  . nystatin (MYCOSTATIN) 100000 UNIT/ML suspension Use as directed 5 mLs (500,000 Units total) in the mouth or throat 4 (four) times daily. 60 mL 1  . nystatin (MYCOSTATIN/NYSTOP) powder Apply topically 3 (three) times daily. (Patient not taking: Reported on 03/24/2017) 15 g 0  . UNABLE TO FIND Med Name: Cranial Prothesis for Chemotherapy 1 each 0   No current facility-administered medications for this visit.    Facility-Administered Medications Ordered in Other Visits  Medication Dose Route Frequency Provider Last Rate Last Dose  . hyaluronate sodium (RADIAPLEXRX) gel   Topical Once Thea Silversmith, MD      . topical emolient (BIAFINE) emulsion   Topical PRN Thea Silversmith, MD        SURGICAL HISTORY:  Past Surgical History:  Procedure Laterality Date  . BRONCHOSCOPY  02/2011  . CARDIAC CATHETERIZATION    . CATARACT EXTRACTION  03/12/2013   Left Eye   . DILATION AND CURETTAGE OF UTERUS  1980's  . EYE SURGERY Left 2014   Cataract  . FETAL BLOOD TRANSFUSION  09/16/11  . JOINT REPLACEMENT  04/13/10   Left Hip  . LUNG LOBECTOMY  1986   left  . SEPTOPLASTY      . TOTAL HIP ARTHROPLASTY  01/2010   left  . TUBAL LIGATION  1980's    REVIEW OF SYSTEMS:  A comprehensive review of systems was negative except for: Constitutional: positive for fatigue Respiratory: positive for cough, dyspnea on exertion and sputum Musculoskeletal: positive for muscle weakness   PHYSICAL EXAMINATION: General appearance: alert, cooperative, fatigued and mild distress Head: Normocephalic, without obvious abnormality, atraumatic Neck: no adenopathy, no JVD, supple, symmetrical, trachea midline and thyroid not enlarged, symmetric, no tenderness/mass/nodules Lymph nodes: Cervical, supraclavicular, and axillary nodes normal. Resp: wheezes bilaterally Back: symmetric, no curvature. ROM normal. No CVA tenderness. Cardio: regular rate and rhythm, S1, S2 normal, no murmur, click, rub or gallop GI: soft, non-tender; bowel sounds normal; no masses,  no organomegaly Extremities: extremities normal, atraumatic, no cyanosis or edema  ECOG PERFORMANCE STATUS: 1 - Symptomatic but completely ambulatory  Blood pressure (!) 144/52, pulse 67, temperature 98.7 F (37.1 C), temperature source Oral, resp. rate 16, weight 169 lb 8 oz (76.9 kg), SpO2 97 %.  LABORATORY DATA: Lab Results  Component Value Date   WBC 7.9 03/24/2017   HGB 12.8 03/24/2017   HCT 38.7 03/24/2017   MCV 88.0 03/24/2017   PLT 223 03/24/2017      Chemistry      Component Value Date/Time   NA 139 03/24/2017 1031   K 3.5 03/24/2017 1031   CL 100 02/14/2017 1701   CL 99 07/21/2012 1125   CO2 24 03/24/2017 1031   BUN 12.1 03/24/2017 1031   CREATININE 1.2 (H) 03/24/2017 1031      Component Value Date/Time   CALCIUM 9.4 03/24/2017 1031   ALKPHOS 93 03/24/2017 1031   AST 15 03/24/2017 1031   ALT <6 03/24/2017 1031   BILITOT 0.37 03/24/2017 1031       RADIOGRAPHIC STUDIES: No results found.  ASSESSMENT AND PLAN:  This is a very pleasant 71 years old white female with recurrent non-small cell lung  cancer, adenocarcinoma that was initially diagnosed as unresectable stage IIA in December 2012 status post systemic chemotherapy as well as immunotherapy.  She is currently undergoing treatment with immunotherapy with Nivolumab 240 mg IV every 2 weeks. I recommended for the patient to continue her current treatment with Nivolumab. She is expected to have repeat CT scan of the chest later today. I will see her back for follow-up visit in 2 weeks for evaluation before the next cycle of her treatment. The patient was advised to call immediately if she has any concerning symptoms in the interval. The patient voices understanding of current disease status and treatment options and is in agreement with the current care plan. All questions were answered. The patient knows to call the clinic with any problems, questions or concerns. We can certainly see the patient much sooner if necessary.  Disclaimer: This note was dictated with voice recognition software. Similar sounding words can inadvertently be transcribed and may not be corrected upon review.

## 2017-03-24 NOTE — Patient Instructions (Signed)
[facility] Discharge Instructions for Patients Receiving Chemotherapy  Today you received the following chemotherapy agents nivolumab   To help prevent nausea and vomiting after your treatment, we encourage you to take your nausea medication as directed   If you develop nausea and vomiting that is not controlled by your nausea medication, call the clinic.   BELOW ARE SYMPTOMS THAT SHOULD BE REPORTED IMMEDIATELY:  *FEVER GREATER THAN 100.5 F  *CHILLS WITH OR WITHOUT FEVER  NAUSEA AND VOMITING THAT IS NOT CONTROLLED WITH YOUR NAUSEA MEDICATION  *UNUSUAL SHORTNESS OF BREATH  *UNUSUAL BRUISING OR BLEEDING  TENDERNESS IN MOUTH AND THROAT WITH OR WITHOUT PRESENCE OF ULCERS  *URINARY PROBLEMS  *BOWEL PROBLEMS  UNUSUAL RASH Items with * indicate a potential emergency and should be followed up as soon as possible.  Feel free to call the clinic you have any questions or concerns. The clinic phone number is [phone].  

## 2017-03-28 ENCOUNTER — Other Ambulatory Visit: Payer: Self-pay | Admitting: Nurse Practitioner

## 2017-03-31 ENCOUNTER — Other Ambulatory Visit: Payer: Self-pay | Admitting: Internal Medicine

## 2017-03-31 ENCOUNTER — Other Ambulatory Visit: Payer: Self-pay | Admitting: Nurse Practitioner

## 2017-03-31 ENCOUNTER — Telehealth: Payer: Self-pay | Admitting: Internal Medicine

## 2017-03-31 NOTE — Telephone Encounter (Signed)
Copied from [place]. Topic: Quick Communication - See Telephone Encounter >> Mar 31, 2017  3:25 PM Corie Chiquito, [place] wrote: CRM for notification. See Telephone encounter for: [place] called from [place] because the patient still needs a refill on her Xanax. If someone could give them a call back at [phone]  03/31/17.

## 2017-03-31 NOTE — Telephone Encounter (Signed)
Xanax too soon as last rx was Mar 08 2017 and good for total 6 mo

## 2017-04-01 NOTE — Telephone Encounter (Signed)
LOV 02/14/17 with Dr. Cathlean Cower / Medication refill for Xanax /

## 2017-04-01 NOTE — Telephone Encounter (Signed)
See previous note  Xanax too sonon

## 2017-04-07 ENCOUNTER — Ambulatory Visit (HOSPITAL_BASED_OUTPATIENT_CLINIC_OR_DEPARTMENT_OTHER): Payer: Medicare Other | Admitting: Internal Medicine

## 2017-04-07 ENCOUNTER — Other Ambulatory Visit (HOSPITAL_BASED_OUTPATIENT_CLINIC_OR_DEPARTMENT_OTHER): Payer: Medicare Other

## 2017-04-07 ENCOUNTER — Telehealth: Payer: Self-pay | Admitting: Internal Medicine

## 2017-04-07 ENCOUNTER — Ambulatory Visit (HOSPITAL_BASED_OUTPATIENT_CLINIC_OR_DEPARTMENT_OTHER): Payer: Medicare Other

## 2017-04-07 ENCOUNTER — Encounter: Payer: Self-pay | Admitting: Internal Medicine

## 2017-04-07 VITALS — BP 169/59

## 2017-04-07 VITALS — BP 90/48 | HR 72 | Temp 98.4°F | Resp 20 | Ht 65.0 in | Wt 166.2 lb

## 2017-04-07 DIAGNOSIS — C3411 Malignant neoplasm of upper lobe, right bronchus or lung: Secondary | ICD-10-CM

## 2017-04-07 DIAGNOSIS — Z5112 Encounter for antineoplastic immunotherapy: Secondary | ICD-10-CM

## 2017-04-07 DIAGNOSIS — J449 Chronic obstructive pulmonary disease, unspecified: Secondary | ICD-10-CM | POA: Diagnosis not present

## 2017-04-07 LAB — CBC WITH DIFFERENTIAL/PLATELET
BASO%: 0.5 % (ref 0.0–2.0)
BASOS ABS: 0 10*3/uL (ref 0.0–0.1)
EOS ABS: 1.1 10*3/uL — AB (ref 0.0–0.5)
EOS%: 12.9 % — ABNORMAL HIGH (ref 0.0–7.0)
HCT: 39 % (ref 34.8–46.6)
HGB: 12.7 g/dL (ref 11.6–15.9)
LYMPH%: 23.7 % (ref 14.0–49.7)
MCH: 29.3 pg (ref 25.1–34.0)
MCHC: 32.6 g/dL (ref 31.5–36.0)
MCV: 90.1 fL (ref 79.5–101.0)
MONO#: 0.7 10*3/uL (ref 0.1–0.9)
MONO%: 8 % (ref 0.0–14.0)
NEUT%: 54.9 % (ref 38.4–76.8)
NEUTROS ABS: 4.8 10*3/uL (ref 1.5–6.5)
PLATELETS: 203 10*3/uL (ref 145–400)
RBC: 4.33 10*6/uL (ref 3.70–5.45)
RDW: 13.5 % (ref 11.2–14.5)
WBC: 8.8 10*3/uL (ref 3.9–10.3)
lymph#: 2.1 10*3/uL (ref 0.9–3.3)

## 2017-04-07 LAB — COMPREHENSIVE METABOLIC PANEL
ALT: 8 U/L (ref 0–55)
ANION GAP: 11 meq/L (ref 3–11)
AST: 13 U/L (ref 5–34)
Albumin: 3.9 g/dL (ref 3.5–5.0)
Alkaline Phosphatase: 94 U/L (ref 40–150)
BUN: 13.8 mg/dL (ref 7.0–26.0)
CHLORIDE: 103 meq/L (ref 98–109)
CO2: 27 meq/L (ref 22–29)
Calcium: 9.5 mg/dL (ref 8.4–10.4)
Creatinine: 1.1 mg/dL (ref 0.6–1.1)
EGFR: 49 mL/min/{1.73_m2} — AB (ref >60)
GLUCOSE: 93 mg/dL (ref 70–140)
POTASSIUM: 3.4 meq/L — AB (ref 3.5–5.1)
SODIUM: 141 meq/L (ref 136–145)
Total Bilirubin: 0.36 mg/dL (ref 0.20–1.20)
Total Protein: 6.7 g/dL (ref 6.4–8.3)

## 2017-04-07 MED ORDER — SODIUM CHLORIDE 0.9 % IV SOLN
Freq: Once | INTRAVENOUS | Status: AC
  Administered 2017-04-07: 13:00:00 via INTRAVENOUS

## 2017-04-07 MED ORDER — SODIUM CHLORIDE 0.9 % IV SOLN
240.0000 mg | Freq: Once | INTRAVENOUS | Status: AC
  Administered 2017-04-07: 240 mg via INTRAVENOUS
  Filled 2017-04-07: qty 24

## 2017-04-07 NOTE — Telephone Encounter (Signed)
Scheduled appt per 12/27 los - Gave patient AVS and calender per los.  

## 2017-04-07 NOTE — Patient Instructions (Signed)
[facility] Discharge Instructions for Patients Receiving Chemotherapy  Today you received the following chemotherapy agents nivolumab ([place])  To help prevent nausea and vomiting after your treatment, we encourage you to take your nausea medication as directed  If you develop nausea and vomiting that is not controlled by your nausea medication, call the clinic.   BELOW ARE SYMPTOMS THAT SHOULD BE REPORTED IMMEDIATELY:  *FEVER GREATER THAN 100.5 F  *CHILLS WITH OR WITHOUT FEVER  NAUSEA AND VOMITING THAT IS NOT CONTROLLED WITH YOUR NAUSEA MEDICATION  *UNUSUAL SHORTNESS OF BREATH  *UNUSUAL BRUISING OR BLEEDING  TENDERNESS IN MOUTH AND THROAT WITH OR WITHOUT PRESENCE OF ULCERS  *URINARY PROBLEMS  *BOWEL PROBLEMS  UNUSUAL RASH Items with * indicate a potential emergency and should be followed up as soon as possible.  Feel free to call the clinic you have any questions or concerns. The clinic phone number is [phone].

## 2017-04-07 NOTE — Progress Notes (Signed)
[facility] [phone]   [phone]  OFFICE PROGRESS NOTE  [name], MD [place] [address]  DIAGNOSIS: Recurrent non-small cell lung cancer initially diagnosed as a stage IIA adenocarcinoma in December 2012 with no actionable mutations.   PRIOR THERAPY: 1) Status post radiation therapy to the right upper lobe lung mass completed on 06/04/2011 under the care of Dr. Pablo Ledger.  2) Systemic chemotherapy with carboplatin for AUC of 5 and Alimta 500 mg/M2 giving every 3 weeks, status post 3 cycles, last dose was given on 07/21/2011.  3) Systemic chemotherapy with carboplatin for AUC of 5 and Alimta 500 MG/M2 every 3 weeks. First dose on 01/02/2015. Status post 2 cycles, last dose was given 01/30/2015. Her treatment is currently on hold secondary to intolerance. 4) Immunotherapy with Nivolumab 240 M IV every 2 weeks status post 14 cycles.  5)immunotherapy with Nivolumab 480 mg IV every 4 weeks. Patient is status post 1 cycle. The patient discontinued treatment with Nivolumab every 4 weeks because she want to switch back to every 2 weeks due to increased side effects with the monthly dosing.  CURRENT THERAPY: The patient will resume her treatment again with immunotherapy with Nivolumab 240 mg IV every 2 weeks. First dose 09/15/2016. Status post 4 cycles  INTERVAL HISTORY: [name] 71 y.o. female returns to the clinic today for follow-up visit accompanied by her son.  The patient is doing fine today with no specific complaints except for her baseline shortness of breath increased with exertion.  She is currently on home oxygen.  Her son mentioned that she had some memory issues at times and lack of concentration.  She denied having any chest pain, cough or hemoptysis.  She denied having any fever or chills.  She has no nausea, vomiting, diarrhea or constipation.  She continues to tolerate her treatment with Nivolumab fairly  well.  She had repeat CT scan of the chest performed 2 weeks ago and she is here for evaluation and discussion of her scan results and treatment options.  MEDICAL HISTORY: Past Medical History:  Diagnosis Date  . Adenocarcinoma, lung ([place])    upper left lobe  . Anemia in neoplastic disease   . Anxiety   . Arthritis    "all over my body"  . Asthma   . Asymptomatic varicose veins   . Atherosclerosis of native arteries of the extremities, unspecified   . Atrial fibrillation ([place])   . Bronchial pneumonia    history  . 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 Chemotherapy adverse reaction 09/15/11   "makes me light headed and pass out; this is 3rd blood transfusion since going thru it"  . CHF (congestive heart failure) ([place])   . Chronic airway obstruction, not elsewhere classified   . Chronic fatigue 08/19/2015  . Chronic sinusitis   . Complication of anesthesia    slow to wake up  . COPD with emphysema ([place])   . Coronary artery disease    Dr. Burt Knack had stress test done  . DDD (degenerative disc disease), lumbar   . Depressive disorder, not elsewhere classified   . Diseases of lips   . Disorder of bone and cartilage, unspecified   . DVT (deep venous thrombosis) ([place])   . Fibromyalgia   . Functional urinary incontinence   . GERD (gastroesophageal reflux disease)   . Hard of hearing, right   . Herpes zoster without  mention of complication   . Hip pain, chronic 09/17/2014  . History of blood clots    "2 in my aorta"  . History of blood transfusion   . History of bronchitis   . History of echocardiogram    Echo 11/18: mild LVH, EF 60-65, no RWMA, Gr 1 DD, Ao Sclerosis, mild AI, RVH with low nl RSVF, trivial eff  . Hx of radiation therapy 04/28/11 -06/08/11   right upper lobe-lung  . Hyperlipidemia   . Hypertension   . Kidney infection    "related to chemo"  . Lumbago   . Malignant neoplasm of bronchus and lung, unspecified site   . Mental disorder   . Muscle weakness  (generalized)   . Non-small cell lung cancer ([place]) 03/31/2011   Adenocarcinoma Rt upper lobe mass  . Osteoarthrosis, unspecified whether generalized or localized, unspecified site   . Osteoporosis   . Other abnormal blood chemistry   . Other and unspecified hyperlipidemia   . PAD (peripheral artery disease) ([place])   . Pain in thoracic spine   . 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 Primary cancer of right upper lobe of lung ([place]) 02/08/2011   Biopsy Rt upper lobe mass 03/31/11>>adenocarcinoma DIAGNOSIS: Stage IIB/IIIA non-small cell lung cancer consistent with adenocarcinoma diagnosed in December 2012.   PRIOR THERAPY:  1) Status post radiation therapy to the right upper lobe lung mass completed on 06/04/2011 under the care of Dr. Pablo Ledger.  2) Systemic chemotherapy with carboplatin for AUC of 5 and Alimta 500 mg/M2 giving every 3   . Reflux   . Reflux esophagitis   . Respiratory failure with hypoxia 01/02/2010  . Restless legs syndrome (RLS)   . Scoliosis   . Screening for thyroid disorder   . Shortness of breath    "anytime really"  . Sinus headache    "all the time"  . Spasm of muscle   . Syncope and collapse   . Type II or unspecified type diabetes mellitus with neurological manifestations, uncontrolled(250.62)   . Type II or unspecified type diabetes mellitus without mention of complication, not stated as uncontrolled   . Type II or unspecified type diabetes mellitus without mention of complication, not stated as uncontrolled   . Unspecified constipation   . Unspecified essential hypertension   . Unspecified hearing loss   . Unspecified hereditary and idiopathic peripheral neuropathy   . Unspecified vitamin D deficiency     ALLERGIES:  is allergic to iohexol; other; anoro ellipta [umeclidinium-vilanterol]; codeine; and protonix [pantoprazole sodium].  MEDICATIONS:  Current Outpatient Medications  Medication Sig Dispense Refill  . ACCU-CHEK SOFTCLIX LANCETS lancets by  Other route. Check blood sugar twice daily. Dx: E11.9, E11.49    . albuterol (PROVENTIL HFA;VENTOLIN HFA) 108 (90 Base) MCG/ACT inhaler Inhale 2 puffs into the lungs every 6 (six) hours as needed for wheezing or shortness of breath. 1 Inhaler 5  . albuterol (PROVENTIL) (2.5 MG/3ML) 0.083% nebulizer solution Take 3 mLs (2.5 mg total) by nebulization every 6 (six) hours as needed for wheezing or shortness of breath. 75 mL 5  . ALPRAZolam (XANAX) 1 MG tablet Take 1 tablet (1 mg total) by mouth 2 (two) times daily as needed for anxiety. 60 tablet 5  . aspirin 81 MG tablet Take 81 mg by mouth daily.    . benzonatate (TESSALON) 100 MG capsule Take 1 capsule (100 mg total) by mouth 3 (three) times daily as needed for cough. 40 capsule 1  . Blood Glucose Monitoring  Suppl (ACCU-CHEK AVIVA PLUS) w/Device KIT Use as directed to check blood sugar Dx: E11.9, E11.49 1 kit 0  . Calcium Carbonate-Vit D-Min (CALTRATE PLUS PO) Take 1 tablet by mouth daily.    . citalopram (CELEXA) 20 MG tablet Take 1 tablet (20 mg total) daily by mouth. 90 tablet 3  . Dexlansoprazole (DEXILANT) 30 MG capsule Take 1 capsule (30 mg total) daily by mouth. 90 capsule 3  . diltiazem (MATZIM LA) 240 MG 24 hr tablet Take 240 mg by mouth daily.    Marland Kitchen DIPHENHIST 25 MG capsule TAKE 1 CAPSULE BY MOUTH 2 HOURS PRIOR TO CT SCAN 10 capsule 0  . Ferrous Fumarate-Folic Acid (HEMATINIC/FOLIC ACID) 982-6 MG TABS Take 1 tablet daily by mouth. 90 each 1  . fexofenadine (ALLEGRA) 180 MG tablet Take 180 mg by mouth daily.    . fluticasone (FLONASE) 50 MCG/ACT nasal spray INSTILL 2 SPRAYS IN EACH NOSTRIL TWICE DAILY *NEEDS APPOINTMENT* 16 g 5  . Fluticasone-Umeclidin-Vilant (TRELEGY ELLIPTA) 100-62.5-25 MCG/INH AEPB Inhale 1 puff daily into the lungs. 1 each 6  . HYDROcodone-acetaminophen (NORCO) 10-325 MG tablet Take One tablet by mouth every 6 hours if needed for pain 120 tablet 0  . metFORMIN (GLUCOPHAGE) 500 MG tablet Take 1 tablet (500 mg total) daily  as needed by mouth (high blood sugar- 160+). Take one tablet at night for blood sugar 90 tablet 3  . Multiple Vitamins-Minerals (CENTRUM SILVER PO) Take 1 tablet by mouth daily. Patient unsure of dose    . nystatin (MYCOSTATIN) 100000 UNIT/ML suspension Use as directed 5 mLs (500,000 Units total) in the mouth or throat 4 (four) times daily. 60 mL 1  . nystatin (MYCOSTATIN/NYSTOP) powder Apply topically 3 (three) times daily. 15 g 0  . OXYGEN Inhale 4 L into the lungs.    . potassium chloride (K-DUR,KLOR-CON) 10 MEQ tablet Take 1 tablet (10 mEq total) daily by mouth. 90 tablet 3  . ranitidine (ZANTAC) 150 MG tablet Take 1 tablet (150 mg total) by mouth 2 (two) times daily. 180 tablet 3  . traMADol (ULTRAM) 50 MG tablet Take 2 tablets (100 mg total) every 6 (six) hours as needed by mouth for severe pain. 90 tablet 1  . triamterene-hydrochlorothiazide (MAXZIDE-25) 37.5-25 MG tablet TAKE 1 TABLET BY MOUTH EVERY DAY FOR BLOOD PRESSURE 90 tablet 3  . UNABLE TO FIND Med Name: Cranial Prothesis for Chemotherapy 1 each 0   No current facility-administered medications for this visit.    Facility-Administered Medications Ordered in Other Visits  Medication Dose Route Frequency Provider Last Rate Last Dose  . hyaluronate sodium (RADIAPLEXRX) gel   Topical Once Thea Silversmith, MD      . topical emolient (BIAFINE) emulsion   Topical PRN Thea Silversmith, MD        SURGICAL HISTORY:  Past Surgical History:  Procedure Laterality Date  . BRONCHOSCOPY  02/2011  . CARDIAC CATHETERIZATION    . CATARACT EXTRACTION  03/12/2013   Left Eye   . DILATION AND CURETTAGE OF UTERUS  1980's  . EYE SURGERY Left 2014   Cataract  . FETAL BLOOD TRANSFUSION  09/16/11  . JOINT REPLACEMENT  04/13/10   Left Hip  . LUNG LOBECTOMY  1986   left  . SEPTOPLASTY    . TOTAL HIP ARTHROPLASTY  01/2010   left  . TUBAL LIGATION  1980's    REVIEW OF SYSTEMS:  Constitutional: positive for fatigue Eyes: negative Ears, nose,  mouth, throat, and face: negative Respiratory: positive for  dyspnea on exertion and wheezing Cardiovascular: negative Gastrointestinal: negative Genitourinary:negative Integument/breast: negative Hematologic/lymphatic: negative Musculoskeletal:positive for muscle weakness Neurological: negative Behavioral/Psych: negative Endocrine: negative Allergic/Immunologic: negative   PHYSICAL EXAMINATION: General appearance: alert, cooperative, fatigued and mild distress Head: Normocephalic, without obvious abnormality, atraumatic Neck: no adenopathy, no JVD, supple, symmetrical, trachea midline and thyroid not enlarged, symmetric, no tenderness/mass/nodules Lymph nodes: Cervical, supraclavicular, and axillary nodes normal. Resp: wheezes bilaterally Back: symmetric, no curvature. ROM normal. No CVA tenderness. Cardio: regular rate and rhythm, S1, S2 normal, no murmur, click, rub or gallop GI: soft, non-tender; bowel sounds normal; no masses,  no organomegaly Extremities: extremities normal, atraumatic, no cyanosis or edema Neurologic: Alert and oriented X 3, normal strength and tone. Normal symmetric reflexes. Normal coordination and gait  ECOG PERFORMANCE STATUS: 1 - Symptomatic but completely ambulatory  Blood pressure (!) 90/48, pulse 72, temperature 98.4 F (36.9 C), temperature source Oral, resp. rate 20, height 5' 5"  (1.651 m), weight 166 lb 3.2 oz (75.4 kg), SpO2 100 %.  LABORATORY DATA: Lab Results  Component Value Date   WBC 8.8 04/07/2017   HGB 12.7 04/07/2017   HCT 39.0 04/07/2017   MCV 90.1 04/07/2017   PLT 203 04/07/2017      Chemistry      Component Value Date/Time   NA 141 04/07/2017 1016   K 3.4 (L) 04/07/2017 1016   CL 100 02/14/2017 1701   CL 99 07/21/2012 1125   CO2 27 04/07/2017 1016   BUN 13.8 04/07/2017 1016   CREATININE 1.1 04/07/2017 1016      Component Value Date/Time   CALCIUM 9.5 04/07/2017 1016   ALKPHOS 94 04/07/2017 1016   AST 13 04/07/2017 1016    ALT 8 04/07/2017 1016   BILITOT 0.36 04/07/2017 1016       RADIOGRAPHIC STUDIES: Ct Chest Wo Contrast  Result Date: 03/25/2017 CLINICAL DATA:  RIGHT lung cancer diagnosed 1986. LEFT lung cancer diagnosed 2002. Radiation chemotherapy complete. Coughing up blood for several months EXAM: CT CHEST WITHOUT CONTRAST TECHNIQUE: Multidetector CT imaging of the chest was performed following the standard protocol without IV contrast. COMPARISON:  CT chest 11/10/2016, 08/31/2016 FINDINGS: Cardiovascular: Coronary artery calcification and aortic atherosclerotic calcification. Mediastinum/Nodes: No axillary or supraclavicular adenopathy. No mediastinal hilar adenopathy. No pericardial fluid. Small amount of fluid esophagus. Lungs/Pleura: Geographic region of ground-glass opacity in the RIGHT upper lobe is not changed appreciably in the interval (CT 11/10/2016, 08/31/2016). Region measuring approximately 6.8 by 5.0 cm compared with 7.0 by 4.7 cm. Along the medial border of this ground-glass opacity, rounded nodular lesion measuring 15 mm by 14 mm increased in size from 11 mm by 8 mm. This lesion is also seen to be larger on coronal imaging (image 78, series 6). This lesion is immediately adjacent to a bronchus. Band of atelectasis extends inferiorly from the marginal lesion to the RIGHT hilum also unchanged. Small to moderate RIGHT pleural effusion is stable. A band of ground-glass opacity in the superior aspect of the LEFT lower lobe is unchanged (image 53, series 2). Smaller ground-glass focus in the anterior LEFT lower lobe (image 63, series 2) unchanged. Patient status post LEFT upper lobectomy. Upper Abdomen: Bilateral adrenal adenomas.  Cholelithiasis Musculoskeletal: No aggressive osseous lesion. IMPRESSION: 1. Along the medial border of the geographic region of ground-glass opacity in the RIGHT upper lobe , interval increase in rounded nodule adjacent to the bronchus. Findings are concerning for local lung  cancer recurrence. Recommend FDG PET scan and potential bronchoscopy for evaluation. 2.  Stable  perihilar bandlike atelectasis in the RIGHT lung. 3. Stable RIGHT pleural effusion. 4. Stable ground-glass opacities in LEFT lung. Electronically Signed   By: Suzy Bouchard M.D.   On: 03/25/2017 09:53    ASSESSMENT AND PLAN:  This is a very pleasant 71 years old white female with recurrent non-small cell lung cancer, adenocarcinoma that was initially diagnosed as unresectable stage IIA in December 2012 status post systemic chemotherapy as well as immunotherapy.  She is currently undergoing treatment with immunotherapy with Nivolumab 240 mg IV every 2 weeks.  She is status post 4 cycles of the new regimen. She has been tolerating this treatment fairly well with no significant adverse effects. Her recent CT scan of the chest, abdomen and pelvis showed stable disease except for slightly enlarged rounded nodule adjacent to the bronchus. I personally and independently reviewed the scan images and discussed the results with the patient and her son and showed them the images. The increase in the pulmonary nodule is very mild. I recommended for the patient to continue her current treatment with Nivolumab and she would proceed with cycle #5 today. I will see her back for follow-up visit in 2 weeks for evaluation with the start of cycle #6. For COPD, she would continue her current inhaler and treatment as prescribed by [name]. She was advised to call immediately if she has any concerning symptoms in the interval. The patient voices understanding of current disease status and treatment options and is in agreement with the current care plan. All questions were answered. The patient knows to call the clinic with any problems, questions or concerns. We can certainly see the patient much sooner if necessary.  Disclaimer: This note was dictated with voice recognition software. Similar sounding words can  inadvertently be transcribed and may not be corrected upon review.

## 2017-04-13 ENCOUNTER — Encounter: Payer: Self-pay | Admitting: Adult Health

## 2017-04-13 ENCOUNTER — Ambulatory Visit (INDEPENDENT_AMBULATORY_CARE_PROVIDER_SITE_OTHER): Payer: Medicare Other | Admitting: Adult Health

## 2017-04-13 ENCOUNTER — Telehealth: Payer: Self-pay | Admitting: Emergency Medicine

## 2017-04-13 VITALS — BP 148/64 | HR 87 | Temp 97.9°F | Ht 65.0 in | Wt 167.0 lb

## 2017-04-13 DIAGNOSIS — J441 Chronic obstructive pulmonary disease with (acute) exacerbation: Secondary | ICD-10-CM | POA: Diagnosis not present

## 2017-04-13 DIAGNOSIS — C3411 Malignant neoplasm of upper lobe, right bronchus or lung: Secondary | ICD-10-CM | POA: Diagnosis not present

## 2017-04-13 DIAGNOSIS — G44229 Chronic tension-type headache, not intractable: Secondary | ICD-10-CM | POA: Diagnosis not present

## 2017-04-13 DIAGNOSIS — J9611 Chronic respiratory failure with hypoxia: Secondary | ICD-10-CM

## 2017-04-13 DIAGNOSIS — M5136 Other intervertebral disc degeneration, lumbar region: Secondary | ICD-10-CM | POA: Diagnosis not present

## 2017-04-13 DIAGNOSIS — R531 Weakness: Secondary | ICD-10-CM | POA: Diagnosis not present

## 2017-04-13 DIAGNOSIS — M25552 Pain in left hip: Secondary | ICD-10-CM

## 2017-04-13 DIAGNOSIS — G8929 Other chronic pain: Secondary | ICD-10-CM | POA: Diagnosis not present

## 2017-04-13 MED ORDER — AMOXICILLIN-POT CLAVULANATE 875-125 MG PO TABS: 1.0000 | ORAL_TABLET | Freq: Two times a day (BID) | ORAL | 0 refills | Status: DC

## 2017-04-13 MED ORDER — HYDROCODONE-ACETAMINOPHEN 10-325 MG PO TABS: ORAL_TABLET | ORAL | 0 refills | Status: DC

## 2017-04-13 NOTE — Telephone Encounter (Signed)
Called [name] letting her know that based off of her symptoms, [name] said she needed to be seen in the office. [name] expressed understanding.  Scheduled [name] for an acute visit with TP at 11:00.  Told [name] that [name] had okayed a refill of her hydrocodone and stated to her that when she came for her visit with TP that we could take care of the Rx then.  Nothing further needed.

## 2017-04-13 NOTE — Patient Instructions (Signed)
Augmentin 875 mg twice daily for 1 week Mucinex DM twice daily as needed for cough and congestion Continue on TRELEGY daily  Follow-up with [name] in 2-3 months and As needed   Please contact office for sooner follow up if symptoms do not improve or worsen or seek emergency care

## 2017-04-13 NOTE — Telephone Encounter (Signed)
Pt states that she is freezing a lot, head is so congested even with using allegra, has a lot of drainage from her ears and has congestion in chest. Pt stated that pills are getting stuck in throat when taking them.  Pt wakes up in the mornings with either a headache or an earache. Pt is taking chemo and stated that she cannot have steroids. States that she comes off of infusions every two weeks so she can take abx.  Asked pt if she feels like she has a fever and she stated that she might have a fever knowing that she is freezing and does have body aches.  Pt is wanting to know if a Rx can be sent in to help her symptoms.  Also wanting to know if she can get a refill of her hydrocodone-acetaminophen. Med was last filled 03/08/17 #120tabs with 0-RF by Dr.Byrum.  [name], please advise on the above.  Thanks!

## 2017-04-13 NOTE — Assessment & Plan Note (Signed)
Cont on O2 .  

## 2017-04-13 NOTE — Telephone Encounter (Signed)
Scheduled her 3 mo ROV with RB while we were on the phone.

## 2017-04-13 NOTE — Progress Notes (Signed)
@Patient  ID: [name], female    DOB: 02-12-1946, 72 y.o.   MRN: [id number]  Chief Complaint  Patient presents with  . 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  And oxygen dependent respiratory failure  04/13/2017 Acute OV : COPD continue Patient presents for an acute office visit.  She complains of 2 weeks  of increased sinus congestion , drainage, ear and sinus pressure, increased cough and congestion . She has chronic intermittently hemoptysis -pink tinged mucus . No frank hemoptysis . Flu swab neg.   She has Severe COPD . She is on TRELEGY.   Patient has known lung cancer followed by oncology currently on immunotherapy Recent CT chest March 24, 2017 showed groundglass opacity in the right upper lobe with interval increase in the rounded nodule adjacent to the bronchus.  Stable groundglass opacities in the left lung and stable right pleural effusion.  She remains on oxygen 2-3 l/m .   Has chronic pain in chest  And joints. She takes Vicodin 2-3 tabs daily .  Pt education on narcotic and potential side effects.       Allergies  Allergen Reactions  . Iohexol Shortness Of Breath and Other (See Comments)    "burns me up inside"    Pt does fine with 13 hour prep  01/17/12  . Other     CAN'T STAND THE SMELL OF PURE BLEACH; "HAVE TO BACK UP AND POUR AND DON'T BREATH IT TIL i GET CAP BACK ON; DILUTE IT TO SPRAY"  . Anoro Ellipta [Umeclidinium-Vilanterol] Swelling    Swelling of lips and mouth  . Codeine Itching and Nausea And Vomiting    REACTION: GI upset  . Protonix [Pantoprazole Sodium] Diarrhea    Immunization History  Administered Date(s) Administered  . Influenza Split 02/08/2007, 03/19/2011, 01/27/2012  . Influenza Whole 01/02/2010  . Influenza, High Dose Seasonal PF 02/03/2017  . Influenza,inj,Quad PF,6+ Mos 12/27/2012, 02/12/2014,  01/22/2016  . Pneumococcal Conjugate-13 02/14/2017  . Pneumococcal Polysaccharide-23 03/27/2013  . Tdap 02/14/2017    Past Medical History:  Diagnosis Date  . Adenocarcinoma, lung ([place])    upper left lobe  . Anemia in neoplastic disease   . Anxiety   . Arthritis    "all over my body"  . Asthma   . Asymptomatic varicose veins   . Atherosclerosis of native arteries of the extremities, unspecified   . Atrial fibrillation ([place])   . Bronchial pneumonia    history  . 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 Chemotherapy adverse reaction 09/15/11   "makes me light headed and pass out; this is 3rd blood transfusion since going thru it"  . CHF (congestive heart failure) ([place])   . Chronic airway obstruction, not elsewhere classified   . Chronic fatigue 08/19/2015  . Chronic sinusitis   . Complication of anesthesia    slow to wake up  . COPD with emphysema ([place])   . Coronary artery disease    Dr. Burt Knack had stress test done  . DDD (degenerative disc disease), lumbar   . Depressive disorder, not elsewhere classified   . Diseases of lips   . Disorder of bone and cartilage, unspecified   . DVT (deep venous thrombosis) ([place])   . Fibromyalgia   . Functional urinary incontinence   . GERD (gastroesophageal reflux disease)   .  Hard of hearing, right   . Herpes zoster without mention of complication   . Hip pain, chronic 09/17/2014  . History of blood clots    "2 in my aorta"  . History of blood transfusion   . History of bronchitis   . History of echocardiogram    Echo 11/18: mild LVH, EF 60-65, no RWMA, Gr 1 DD, Ao Sclerosis, mild AI, RVH with low nl RSVF, trivial eff  . Hx of radiation therapy 04/28/11 -06/08/11   right upper lobe-lung  . Hyperlipidemia   . Hypertension   . Kidney infection    "related to chemo"  . Lumbago   . Malignant neoplasm of bronchus and lung, unspecified site   . Mental disorder   . Muscle weakness (generalized)   . Non-small cell lung cancer ([place])  03/31/2011   Adenocarcinoma Rt upper lobe mass  . Osteoarthrosis, unspecified whether generalized or localized, unspecified site   . Osteoporosis   . Other abnormal blood chemistry   . Other and unspecified hyperlipidemia   . PAD (peripheral artery disease) ([place])   . Pain in thoracic spine   . 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 Primary cancer of right upper lobe of lung ([place]) 02/08/2011   Biopsy Rt upper lobe mass 03/31/11>>adenocarcinoma DIAGNOSIS: Stage IIB/IIIA non-small cell lung cancer consistent with adenocarcinoma diagnosed in December 2012.   PRIOR THERAPY:  1) Status post radiation therapy to the right upper lobe lung mass completed on 06/04/2011 under the care of Dr. Pablo Ledger.  2) Systemic chemotherapy with carboplatin for AUC of 5 and Alimta 500 mg/M2 giving every 3   . Reflux   . Reflux esophagitis   . Respiratory failure with hypoxia 01/02/2010  . Restless legs syndrome (RLS)   . Scoliosis   . Screening for thyroid disorder   . Shortness of breath    "anytime really"  . Sinus headache    "all the time"  . Spasm of muscle   . Syncope and collapse   . Type II or unspecified type diabetes mellitus with neurological manifestations, uncontrolled(250.62)   . Type II or unspecified type diabetes mellitus without mention of complication, not stated as uncontrolled   . Type II or unspecified type diabetes mellitus without mention of complication, not stated as uncontrolled   . Unspecified constipation   . Unspecified essential hypertension   . Unspecified hearing loss   . Unspecified hereditary and idiopathic peripheral neuropathy   . Unspecified vitamin D deficiency     Tobacco History: Social History   Tobacco Use  Smoking Status Former Smoker  . Packs/day: 1.00  . Years: 40.00  . Pack years: 40.00  . Types: Cigarettes  . Last attempt to quit: 02/10/1998  . Years since quitting: 19.1  Smokeless Tobacco Never Used   Counseling given: Not  Answered   Outpatient Encounter Medications as of 04/13/2017  Medication Sig  . ACCU-CHEK SOFTCLIX LANCETS lancets by Other route. Check blood sugar twice daily. Dx: E11.9, E11.49  . albuterol (PROVENTIL HFA;VENTOLIN HFA) 108 (90 Base) MCG/ACT inhaler Inhale 2 puffs into the lungs every 6 (six) hours as needed for wheezing or shortness of breath.  Marland Kitchen albuterol (PROVENTIL) (2.5 MG/3ML) 0.083% nebulizer solution Take 3 mLs (2.5 mg total) by nebulization every 6 (six) hours as needed for wheezing or shortness of breath.  . ALPRAZolam (XANAX) 1 MG tablet Take 1 tablet (1 mg total) by mouth 2 (two) times daily as needed for anxiety.  Marland Kitchen aspirin 81 MG tablet Take  81 mg by mouth daily.  . benzonatate (TESSALON) 100 MG capsule Take 1 capsule (100 mg total) by mouth 3 (three) times daily as needed for cough.  . Blood Glucose Monitoring Suppl (ACCU-CHEK AVIVA PLUS) w/Device KIT Use as directed to check blood sugar Dx: E11.9, E11.49  . Calcium Carbonate-Vit D-Min (CALTRATE PLUS PO) Take 1 tablet by mouth daily.  . citalopram (CELEXA) 20 MG tablet Take 1 tablet (20 mg total) daily by mouth.  . Dexlansoprazole (DEXILANT) 30 MG capsule Take 1 capsule (30 mg total) daily by mouth.  . diltiazem (MATZIM LA) 240 MG 24 hr tablet Take 240 mg by mouth daily.  Marland Kitchen DIPHENHIST 25 MG capsule TAKE 1 CAPSULE BY MOUTH 2 HOURS PRIOR TO CT SCAN  . fexofenadine (ALLEGRA) 180 MG tablet Take 180 mg by mouth daily.  . fluticasone (FLONASE) 50 MCG/ACT nasal spray INSTILL 2 SPRAYS IN EACH NOSTRIL TWICE DAILY *NEEDS APPOINTMENT*  . Fluticasone-Umeclidin-Vilant (TRELEGY ELLIPTA) 100-62.5-25 MCG/INH AEPB Inhale 1 puff daily into the lungs.  Marland Kitchen HYDROcodone-acetaminophen (NORCO) 10-325 MG tablet Take One tablet by mouth every 6 hours if needed for pain  . metFORMIN (GLUCOPHAGE) 500 MG tablet Take 1 tablet (500 mg total) daily as needed by mouth (high blood sugar- 160+). Take one tablet at night for blood sugar  . Multiple Vitamins-Minerals  (CENTRUM SILVER PO) Take 1 tablet by mouth daily. Patient unsure of dose  . nystatin (MYCOSTATIN) 100000 UNIT/ML suspension Use as directed 5 mLs (500,000 Units total) in the mouth or throat 4 (four) times daily.  Marland Kitchen nystatin (MYCOSTATIN/NYSTOP) powder Apply topically 3 (three) times daily.  . OXYGEN Inhale 4 L into the lungs.  . potassium chloride (K-DUR,KLOR-CON) 10 MEQ tablet Take 1 tablet (10 mEq total) daily by mouth.  . ranitidine (ZANTAC) 150 MG tablet Take 1 tablet (150 mg total) by mouth 2 (two) times daily.  . traMADol (ULTRAM) 50 MG tablet Take 2 tablets (100 mg total) every 6 (six) hours as needed by mouth for severe pain.  Marland Kitchen triamterene-hydrochlorothiazide (MAXZIDE-25) 37.5-25 MG tablet TAKE 1 TABLET BY MOUTH EVERY DAY FOR BLOOD PRESSURE  . UNABLE TO FIND Med Name: Cranial Prothesis for Chemotherapy  . [DISCONTINUED] Ferrous Fumarate-Folic Acid (HEMATINIC/FOLIC ACID) 761-9 MG TABS Take 1 tablet daily by mouth.  . [DISCONTINUED] HYDROcodone-acetaminophen (NORCO) 10-325 MG tablet Take One tablet by mouth every 6 hours if needed for pain  . amoxicillin-clavulanate (AUGMENTIN) 875-125 MG tablet Take 1 tablet by mouth 2 (two) times daily.   Facility-Administered Encounter Medications as of 04/13/2017  Medication  . hyaluronate sodium (RADIAPLEXRX) gel  . topical emolient (BIAFINE) emulsion     Review of Systems  Constitutional:   No  weight loss, night sweats,  Fevers, chills,  +fatigue, or  lassitude.  HEENT:   No headaches,  Difficulty swallowing,  Tooth/dental problems, or  Sore throat,                No sneezing, itching, ear ache, + nasal congestion, post nasal drip,   CV:  No chest pain,  Orthopnea, PND, swelling in lower extremities, anasarca, dizziness, palpitations, syncope.   GI  No heartburn, indigestion, abdominal pain, nausea, vomiting, diarrhea, change in bowel habits, loss of appetite, bloody stools.   Resp:   No chest wall deformity  Skin: no rash or  lesions.  GU: no dysuria, change in color of urine, no urgency or frequency.  No flank pain, no hematuria   MS:  No joint pain or swelling.  No  decreased range of motion.  No back pain.    Physical Exam  BP (!) 148/64   Pulse 87   Temp 97.9 F (36.6 C) (Oral)   Ht 5' 5"  (1.651 m)   Wt 167 lb (75.8 kg)   SpO2 99%   BMI 27.79 kg/m   GEN: A/Ox3; pleasant , NAD, elderly    HEENT:  [place]/AT,  EACs-clear, TMs-wnl, NOSE-clear drainage , THROAT-clear, no lesions, no postnasal drip or exudate noted.   NECK:  Supple w/ fair ROM; no JVD; normal carotid impulses w/o bruits; no thyromegaly or nodules palpated; no lymphadenopathy.    RESP  Few trace rhonchi ,  no accessory muscle use, no dullness to percussion  CARD:  RRR, no m/r/g, no peripheral edema, pulses intact, no cyanosis or clubbing.  GI:   Soft & nt; nml bowel sounds; no organomegaly or masses detected.   Musco: Warm bil, no deformities or joint swelling noted.   Neuro: alert, no focal deficits noted.    Skin: Warm, no lesions or rashes    Lab Results:  CBC   ProBNP    Component Value Date/Time   PROBNP 160 11/30/2016 1627    Imaging: Ct Chest Wo Contrast  Result Date: 03/25/2017 CLINICAL DATA:  RIGHT lung cancer diagnosed 1986. LEFT lung cancer diagnosed 2002. Radiation chemotherapy complete. Coughing up blood for several months EXAM: CT CHEST WITHOUT CONTRAST TECHNIQUE: Multidetector CT imaging of the chest was performed following the standard protocol without IV contrast. COMPARISON:  CT chest 11/10/2016, 08/31/2016 FINDINGS: Cardiovascular: Coronary artery calcification and aortic atherosclerotic calcification. Mediastinum/Nodes: No axillary or supraclavicular adenopathy. No mediastinal hilar adenopathy. No pericardial fluid. Small amount of fluid esophagus. Lungs/Pleura: Geographic region of ground-glass opacity in the RIGHT upper lobe is not changed appreciably in the interval (CT 11/10/2016, 08/31/2016). Region  measuring approximately 6.8 by 5.0 cm compared with 7.0 by 4.7 cm. Along the medial border of this ground-glass opacity, rounded nodular lesion measuring 15 mm by 14 mm increased in size from 11 mm by 8 mm. This lesion is also seen to be larger on coronal imaging (image 78, series 6). This lesion is immediately adjacent to a bronchus. Band of atelectasis extends inferiorly from the marginal lesion to the RIGHT hilum also unchanged. Small to moderate RIGHT pleural effusion is stable. A band of ground-glass opacity in the superior aspect of the LEFT lower lobe is unchanged (image 53, series 2). Smaller ground-glass focus in the anterior LEFT lower lobe (image 63, series 2) unchanged. Patient status post LEFT upper lobectomy. Upper Abdomen: Bilateral adrenal adenomas.  Cholelithiasis Musculoskeletal: No aggressive osseous lesion. IMPRESSION: 1. Along the medial border of the geographic region of ground-glass opacity in the RIGHT upper lobe , interval increase in rounded nodule adjacent to the bronchus. Findings are concerning for local lung cancer recurrence. Recommend FDG PET scan and potential bronchoscopy for evaluation. 2. Stable  perihilar bandlike atelectasis in the RIGHT lung. 3. Stable RIGHT pleural effusion. 4. Stable ground-glass opacities in LEFT lung. Electronically Signed   By: Suzy Bouchard M.D.   On: 03/25/2017 09:53     Assessment & Plan:   COPD (chronic obstructive pulmonary disease) (HCC) Flare with bronchitis and rhinitis   Plan  Patient Instructions  Augmentin 875 mg twice daily for 1 week Mucinex DM twice daily as needed for cough and congestion Continue on TRELEGY daily  Follow-up with [name] in 2-3 months and As needed   Please contact office for sooner follow up if symptoms  do not improve or worsen or seek emergency care       Respiratory failure with hypoxia ([place]) Cont on O2   Primary cancer of right upper lobe of lung ([place]) Cont follow up with oncology   Back  pain Chronic pain with underlying lung cancer  Pain meds refilled x 1 . Pt educaiton given       Rexene Edison, NP 04/13/2017

## 2017-04-13 NOTE — Telephone Encounter (Signed)
Difficult to sort out cause of her sx by phone - she needs to be seen to determine whether she has infection, etc.   In meantime OK with me to refill her hydrocodone - APAP.

## 2017-04-13 NOTE — Assessment & Plan Note (Signed)
Chronic pain with underlying lung cancer  Pain meds refilled x 1 . Pt educaiton given

## 2017-04-13 NOTE — Assessment & Plan Note (Signed)
Flare with bronchitis and rhinitis   Plan  Patient Instructions  Augmentin 875 mg twice daily for 1 week Mucinex DM twice daily as needed for cough and congestion Continue on TRELEGY daily  Follow-up with [name] in 2-3 months and As needed   Please contact office for sooner follow up if symptoms do not improve or worsen or seek emergency care

## 2017-04-13 NOTE — Assessment & Plan Note (Signed)
Cont follow up with oncology

## 2017-04-18 ENCOUNTER — Other Ambulatory Visit: Payer: Self-pay | Admitting: Internal Medicine

## 2017-04-18 NOTE — Telephone Encounter (Signed)
Xanax too soon as should have refills available

## 2017-04-19 ENCOUNTER — Telehealth: Payer: Self-pay | Admitting: *Deleted

## 2017-04-19 NOTE — Telephone Encounter (Signed)
TCT patient after receiving message that pt called here requesting to speak to Mikey Bussing, NP.  Spoke with patient. She states that she does not feel well at all. C/o of nasal congestion, chest congestion and her ears hurt. She has seen her PCP and is on antibiotics for a few more days.  She is requesting to cancel her appts here this week (lab, Mikey Bussing, NP and chemo) as she does not feel well enough to do those this week.    Spoke with Mikey Bussing, NP.  OK to cancel appts this week and return as scheduled in 2 weeks.  TCT back to patient with above information. Pt expressed gratitude for this week's cancellation.  Pt aware of her appts on 05/05/17

## 2017-04-21 ENCOUNTER — Other Ambulatory Visit: Payer: Medicare Other

## 2017-04-21 ENCOUNTER — Ambulatory Visit: Payer: Medicare Other | Admitting: Oncology

## 2017-04-21 ENCOUNTER — Ambulatory Visit: Payer: Medicare Other

## 2017-04-26 ENCOUNTER — Other Ambulatory Visit: Payer: Self-pay | Admitting: Internal Medicine

## 2017-04-26 ENCOUNTER — Telehealth: Payer: Self-pay | Admitting: Medical Oncology

## 2017-04-26 ENCOUNTER — Other Ambulatory Visit: Payer: Self-pay | Admitting: Emergency Medicine

## 2017-04-26 NOTE — Telephone Encounter (Signed)
Returned pt call. Her bones  hurt all over, neck, spine "tailbone to shoulders", R hand hurts and she dropped her coffee , hips hurt. She is cannot get  comfortable  to sleep

## 2017-04-26 NOTE — Telephone Encounter (Signed)
Done erx 

## 2017-04-26 NOTE — Telephone Encounter (Signed)
She can see [name] or wait until the next visit for evaluation.

## 2017-04-27 ENCOUNTER — Telehealth: Payer: Self-pay | Admitting: Medical Oncology

## 2017-04-27 ENCOUNTER — Other Ambulatory Visit: Payer: Self-pay | Admitting: Internal Medicine

## 2017-04-27 ENCOUNTER — Telehealth: Payer: Self-pay | Admitting: Medical

## 2017-04-27 NOTE — Telephone Encounter (Signed)
Appointment confirmed for tomorrow with Sandi Mealy.

## 2017-04-27 NOTE — Telephone Encounter (Signed)
Called patient to schedule with symptom management - she said to call back this afternoon to plan it with her aid.

## 2017-04-28 ENCOUNTER — Inpatient Hospital Stay: Payer: Medicare Other | Attending: Medical | Admitting: Medical

## 2017-04-28 VITALS — BP 160/69 | HR 92 | Temp 99.1°F | Resp 16 | Ht 65.0 in | Wt 170.9 lb

## 2017-04-28 DIAGNOSIS — J449 Chronic obstructive pulmonary disease, unspecified: Secondary | ICD-10-CM | POA: Insufficient documentation

## 2017-04-28 DIAGNOSIS — M1990 Unspecified osteoarthritis, unspecified site: Secondary | ICD-10-CM | POA: Diagnosis not present

## 2017-04-28 DIAGNOSIS — Z87891 Personal history of nicotine dependence: Secondary | ICD-10-CM | POA: Diagnosis not present

## 2017-04-28 DIAGNOSIS — Z5112 Encounter for antineoplastic immunotherapy: Secondary | ICD-10-CM | POA: Diagnosis not present

## 2017-04-28 DIAGNOSIS — J320 Chronic maxillary sinusitis: Secondary | ICD-10-CM | POA: Diagnosis not present

## 2017-04-28 DIAGNOSIS — Z808 Family history of malignant neoplasm of other organs or systems: Secondary | ICD-10-CM | POA: Insufficient documentation

## 2017-04-28 DIAGNOSIS — E119 Type 2 diabetes mellitus without complications: Secondary | ICD-10-CM | POA: Diagnosis not present

## 2017-04-28 DIAGNOSIS — C3411 Malignant neoplasm of upper lobe, right bronchus or lung: Secondary | ICD-10-CM | POA: Insufficient documentation

## 2017-04-28 DIAGNOSIS — I10 Essential (primary) hypertension: Secondary | ICD-10-CM | POA: Diagnosis not present

## 2017-04-28 MED ORDER — SULFAMETHOXAZOLE-TRIMETHOPRIM 800-160 MG PO TABS: 1.0000 | ORAL_TABLET | Freq: Two times a day (BID) | ORAL | 0 refills | Status: DC

## 2017-04-29 ENCOUNTER — Telehealth: Payer: Self-pay | Admitting: Internal Medicine

## 2017-04-29 DIAGNOSIS — M549 Dorsalgia, unspecified: Secondary | ICD-10-CM

## 2017-04-29 NOTE — Telephone Encounter (Signed)
See below

## 2017-04-29 NOTE — Telephone Encounter (Signed)
Please clarify, does she mean orthopedic, or maybe an Endocrinologist?  And please state reason, so can be coded correctly

## 2017-04-29 NOTE — Progress Notes (Signed)
[facility] Progress Note   [name] [id number] 08/15/1945 72 y.o.  ADDILYN Fry is managed by Dr. Eilleen Kempf  Actively treated with chemotherapy: yes  Current Therapy: Nivolumab  Last Treated:  04/07/2017 (cycle 5, day 1)  Assessment: Plan:    Chronic maxillary sinusitis - Plan: sulfamethoxazole-trimethoprim (BACTRIM DS,SEPTRA DS) 800-160 MG tablet   Maxillary sinusitis: The patient was given a prescription for Bactrim DS p.o. twice daily.  Please see After Visit Summary for patient specific instructions.  Future Appointments  Date Time Provider [place]  05/05/2017 11:00 AM [facility] LAB 2 [facility] None  05/05/2017 11:30 AM Maryanna Shape, NP [facility] None  05/05/2017 12:30 PM [facility] F20 [facility] None  05/05/2017  3:45 PM Byrum, Rose Fillers, MD [facility] None  05/19/2017 10:30 AM [facility] LAB 2 [facility] None  05/19/2017 11:00 AM Curt Bears, MD [facility] None  05/19/2017 12:00 PM [facility] I25 DNS [facility] None  06/02/2017  1:00 PM [facility] LAB 6 [facility] None  06/02/2017  1:30 PM Curcio, Roselie Awkward, NP [facility] None  06/02/2017  2:30 PM [facility] H31 [facility] None  08/17/2017  2:00 PM [name], MD [facility]    No orders of the defined types were placed in this encounter.      Subjective:   Patient ID:  [name] is a 72 y.o. (DOB 10-Feb-1946) female.  Chief Complaint:  Chief Complaint  Patient presents with  . Cough    HPI EMOREE Fry is a 72 year old female with a diagnosis of a recurrent non-small cell lung cancer, adenocarcinoma additionally diagnosed as unresectable stage IIa in December 2012.  She was treated with radiation therapy which was completed on 06/04/2011.  This was followed by systemic chemotherapy with carboplatin and Alimta with the patient completing 3 cycles by 07/21/2011.  This was followed by carboplatin and Alimta dosed every 3 weeks and  began 01/02/2015 and stopping after cycle 2 on 01/30/2015 due to intolerance. The patient was next treated with nivolumab 240 mg IV every 2 weeks.  This was changed to 480 mg IV every 4 weeks due to the patient's difficulty in getting to all of her appointments.  The patient requested to be switched back to every 2 week dosing after only 1 month.  She continues therapy with nivolumab.  She presents to the office today reporting that she was recently been treated for bronchitis.  She was given Augmentin which she completed this past weekend.  Despite this she continues to have a cough which currently is nonproductive.  She reports having sinus pressure and pain, postnasal drainage and diffuse arthralgias.  She has a history of arthritis.  Medications: I have reviewed the patient's current medications.  Allergies:  Allergies  Allergen Reactions  . Iohexol Shortness Of Breath and Other (See Comments)    "burns me up inside"    Pt does fine with 13 hour prep  01/17/12  . Other     CAN'T STAND THE SMELL OF PURE BLEACH; "HAVE TO BACK UP AND POUR AND DON'T BREATH IT TIL i GET CAP BACK ON; DILUTE IT TO SPRAY"  . Anoro Ellipta [Umeclidinium-Vilanterol] Swelling    Swelling of lips and mouth  . Codeine Itching and Nausea And Vomiting    REACTION: GI upset  . Protonix [Pantoprazole Sodium] Diarrhea    Past Medical History:  Diagnosis Date  . Adenocarcinoma, lung ([place])    upper left lobe  . Anemia in neoplastic disease   .  Anxiety   . Arthritis    "all over my body"  . Asthma   . Asymptomatic varicose veins   . Atherosclerosis of native arteries of the extremities, unspecified   . Atrial fibrillation ([place])   . Bronchial pneumonia    history  . 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 Chemotherapy adverse reaction 09/15/11   "makes me light headed and pass out; this is 3rd blood transfusion since going thru it"  . CHF (congestive heart failure) ([place])   . Chronic airway obstruction, not  elsewhere classified   . Chronic fatigue 08/19/2015  . Chronic sinusitis   . Complication of anesthesia    slow to wake up  . COPD with emphysema ([place])   . Coronary artery disease    Dr. Burt Knack had stress test done  . DDD (degenerative disc disease), lumbar   . Depressive disorder, not elsewhere classified   . Diseases of lips   . Disorder of bone and cartilage, unspecified   . DVT (deep venous thrombosis) ([place])   . Fibromyalgia   . Functional urinary incontinence   . GERD (gastroesophageal reflux disease)   . Hard of hearing, right   . Herpes zoster without mention of complication   . Hip pain, chronic 09/17/2014  . History of blood clots    "2 in my aorta"  . History of blood transfusion   . History of bronchitis   . History of echocardiogram    Echo 11/18: mild LVH, EF 60-65, no RWMA, Gr 1 DD, Ao Sclerosis, mild AI, RVH with low nl RSVF, trivial eff  . Hx of radiation therapy 04/28/11 -06/08/11   right upper lobe-lung  . Hyperlipidemia   . Hypertension   . Kidney infection    "related to chemo"  . Lumbago   . Malignant neoplasm of bronchus and lung, unspecified site   . Mental disorder   . Muscle weakness (generalized)   . Non-small cell lung cancer ([place]) 03/31/2011   Adenocarcinoma Rt upper lobe mass  . Osteoarthrosis, unspecified whether generalized or localized, unspecified site   . Osteoporosis   . Other abnormal blood chemistry   . Other and unspecified hyperlipidemia   . PAD (peripheral artery disease) ([place])   . Pain in thoracic spine   . 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 Primary cancer of right upper lobe of lung ([place]) 02/08/2011   Biopsy Rt upper lobe mass 03/31/11>>adenocarcinoma DIAGNOSIS: Stage IIB/IIIA non-small cell lung cancer consistent with adenocarcinoma diagnosed in December 2012.   PRIOR THERAPY:  1) Status post radiation therapy to the right upper lobe lung mass completed on 06/04/2011 under the care of Dr. Pablo Ledger.  2) Systemic chemotherapy with  carboplatin for AUC of 5 and Alimta 500 mg/M2 giving every 3   . Reflux   . Reflux esophagitis   . Respiratory failure with hypoxia 01/02/2010  . Restless legs syndrome (RLS)   . Scoliosis   . Screening for thyroid disorder   . Shortness of breath    "anytime really"  . Sinus headache    "all the time"  . Spasm of muscle   . Syncope and collapse   . Type II or unspecified type diabetes mellitus with neurological manifestations, uncontrolled(250.62)   . Type II or unspecified type diabetes mellitus without mention of complication, not stated as uncontrolled   . Type II or unspecified type diabetes mellitus without mention of complication, not stated as uncontrolled   . Unspecified constipation   .  Unspecified essential hypertension   . Unspecified hearing loss   . Unspecified hereditary and idiopathic peripheral neuropathy   . Unspecified vitamin D deficiency     Past Surgical History:  Procedure Laterality Date  . BRONCHOSCOPY  02/2011  . CARDIAC CATHETERIZATION    . CATARACT EXTRACTION  03/12/2013   Left Eye   . DILATION AND CURETTAGE OF UTERUS  1980's  . EYE SURGERY Left 2014   Cataract  . FETAL BLOOD TRANSFUSION  09/16/11  . JOINT REPLACEMENT  04/13/10   Left Hip  . LUNG LOBECTOMY  1986   left  . SEPTOPLASTY    . TOTAL HIP ARTHROPLASTY  01/2010   left  . TUBAL LIGATION  1980's    Family History  Problem Relation Age of Onset  . Bone cancer Father   . Cancer Father        Bone  . Diabetes Mother   . Deep vein thrombosis Mother   . Heart disease Mother        Heart Disease before age 32-  PVD  . Hyperlipidemia Mother   . Hypertension Mother   . Varicose Veins Mother   . Arthritis Daughter     Social History   Socioeconomic History  . Marital status: Divorced    Spouse name: Not on file  . Number of children: 3  . Years of education: Not on file  . Highest education level: Not on file  Social Needs  . Financial resource strain: Not on file  . Food  insecurity - worry: Not on file  . Food insecurity - inability: Not on file  . Transportation needs - medical: Not on file  . Transportation needs - non-medical: Not on file  Occupational History  . Occupation: disabled  . Occupation: RETIRED    Employer: RETIRED  Tobacco Use  . Smoking status: Former Smoker    Packs/day: 1.00    Years: 40.00    Pack years: 40.00    Types: Cigarettes    Last attempt to quit: 02/10/1998    Years since quitting: 19.2  . Smokeless tobacco: Never Used  Substance and Sexual Activity  . Alcohol use: No  . Drug use: No  . Sexual activity: No  Other Topics Concern  . Not on file  Social History Narrative  . Not on file    Past Medical History, Surgical history, Social history, and Family history were reviewed and updated as appropriate.   Please see review of systems for further details on the patient's review from today.   Review of Systems:  Review of Systems  Constitutional: Positive for fatigue. Negative for chills, diaphoresis and fever.  HENT: Positive for postnasal drip, sinus pressure and sinus pain. Negative for rhinorrhea and voice change.   Respiratory: Positive for cough. Negative for chest tightness, shortness of breath and wheezing.   Cardiovascular: Negative for chest pain, palpitations and leg swelling.  Musculoskeletal: Positive for myalgias.  Neurological: Negative for headaches.    Objective:   Physical Exam:  BP (!) 160/69 (BP Location: Left Arm, Patient Position: Sitting)   Pulse 92   Temp 99.1 F (37.3 C) (Oral)   Resp 16   Ht 5\' 5"  (1.651 m)   Wt 170 lb 14.4 oz (77.5 kg)   SpO2 99%   BMI 28.44 kg/m  ECOG: 2  Physical Exam  Constitutional: No distress.  HENT:  Head: Normocephalic and atraumatic.  Right Ear: External ear normal.  Left Ear: External ear normal.  Nose:  Right sinus exhibits maxillary sinus tenderness. Right sinus exhibits no frontal sinus tenderness. Left sinus exhibits maxillary sinus  tenderness. Left sinus exhibits no frontal sinus tenderness.  Mouth/Throat: Oropharynx is clear and moist. No oropharyngeal exudate.  Eyes: Right eye exhibits no discharge. Left eye exhibits no discharge. No scleral icterus.  Neck: Normal range of motion. Neck supple.  Cardiovascular: Normal rate, regular rhythm and normal heart sounds. Exam reveals no gallop and no friction rub.  No murmur heard. Pulmonary/Chest: Effort normal and breath sounds normal. No respiratory distress. She has no wheezes. She has no rales.  The patient is receiving O2 via nasal cannula.  Musculoskeletal: She exhibits no edema.  Lymphadenopathy:    She has no cervical adenopathy.  Neurological: She is alert. Coordination (The patient is ambulating with the use of a wheelchair.) normal.  Skin: Skin is warm and dry. No rash noted. She is not diaphoretic. No erythema.    Lab Review:     Component Value Date/Time   NA 141 04/07/2017 1016   K 3.4 (L) 04/07/2017 1016   CL 100 02/14/2017 1701   CL 99 07/21/2012 1125   CO2 27 04/07/2017 1016   GLUCOSE 93 04/07/2017 1016   GLUCOSE 184 (H) 07/21/2012 1125   BUN 13.8 04/07/2017 1016   CREATININE 1.1 04/07/2017 1016   CALCIUM 9.5 04/07/2017 1016   PROT 6.7 04/07/2017 1016   ALBUMIN 3.9 04/07/2017 1016   AST 13 04/07/2017 1016   ALT 8 04/07/2017 1016   ALKPHOS 94 04/07/2017 1016   BILITOT 0.36 04/07/2017 1016   GFRNONAA 53 (L) 11/30/2016 1627   GFRAA 61 11/30/2016 1627       Component Value Date/Time   WBC 8.8 04/07/2017 1015   WBC 9.2 03/08/2016 1658   RBC 4.33 04/07/2017 1015   RBC 3.68 (L) 03/08/2016 1658   HGB 12.7 04/07/2017 1015   HCT 39.0 04/07/2017 1015   PLT 203 04/07/2017 1015   MCV 90.1 04/07/2017 1015   MCH 29.3 04/07/2017 1015   MCH 31.0 03/08/2016 1658   MCHC 32.6 04/07/2017 1015   MCHC 34.1 03/08/2016 1658   RDW 13.5 04/07/2017 1015   LYMPHSABS 2.1 04/07/2017 1015   MONOABS 0.7 04/07/2017 1015   EOSABS 1.1 (H) 04/07/2017 1015   EOSABS  0.4 08/06/2015 1036   BASOSABS 0.0 04/07/2017 1015   -------------------------------  Imaging from last 24 hours (if applicable):  Radiology interpretation: No results found.

## 2017-04-29 NOTE — Telephone Encounter (Signed)
Copied from [place] [id number]. Topic: Inquiry >> Apr 29, 2017  2:59 PM Malena Catholic I, NT wrote: Pt call she nee,a referral for a Bone specialist.in [address]

## 2017-05-02 NOTE — Telephone Encounter (Signed)
She would like to see an orthopedic doctor. Please sent referral to [facility]. Patient stated that she has seen them in the past. She would like to be seen by them for osteoporosis, scoliosis and degenerative disk disease.

## 2017-05-02 NOTE — Telephone Encounter (Signed)
Ok for ortho referral [facility] for back pain  Please let he know that they do not normally treat osteoporosis, but can be helpful with mechanical spine problems

## 2017-05-02 NOTE — Addendum Note (Signed)
Addended by: [name] on: 05/02/2017 10:12 AM   Modules accepted: Orders

## 2017-05-05 ENCOUNTER — Inpatient Hospital Stay (HOSPITAL_BASED_OUTPATIENT_CLINIC_OR_DEPARTMENT_OTHER): Payer: Medicare Other | Admitting: Oncology

## 2017-05-05 ENCOUNTER — Inpatient Hospital Stay: Payer: Medicare Other

## 2017-05-05 ENCOUNTER — Ambulatory Visit (INDEPENDENT_AMBULATORY_CARE_PROVIDER_SITE_OTHER): Payer: Medicare Other | Admitting: Emergency Medicine

## 2017-05-05 ENCOUNTER — Encounter: Payer: Self-pay | Admitting: Emergency Medicine

## 2017-05-05 ENCOUNTER — Telehealth: Payer: Self-pay | Admitting: Oncology

## 2017-05-05 ENCOUNTER — Encounter: Payer: Self-pay | Admitting: Oncology

## 2017-05-05 VITALS — BP 109/67 | HR 74 | Temp 98.9°F | Resp 16 | Ht 65.0 in | Wt 170.8 lb

## 2017-05-05 DIAGNOSIS — J449 Chronic obstructive pulmonary disease, unspecified: Secondary | ICD-10-CM | POA: Diagnosis not present

## 2017-05-05 DIAGNOSIS — Z95828 Presence of other vascular implants and grafts: Secondary | ICD-10-CM | POA: Insufficient documentation

## 2017-05-05 DIAGNOSIS — M1990 Unspecified osteoarthritis, unspecified site: Secondary | ICD-10-CM

## 2017-05-05 DIAGNOSIS — Z5112 Encounter for antineoplastic immunotherapy: Secondary | ICD-10-CM | POA: Diagnosis not present

## 2017-05-05 DIAGNOSIS — C3411 Malignant neoplasm of upper lobe, right bronchus or lung: Secondary | ICD-10-CM

## 2017-05-05 DIAGNOSIS — M51369 Other intervertebral disc degeneration, lumbar region without mention of lumbar back pain or lower extremity pain: Secondary | ICD-10-CM

## 2017-05-05 DIAGNOSIS — I10 Essential (primary) hypertension: Secondary | ICD-10-CM | POA: Diagnosis not present

## 2017-05-05 DIAGNOSIS — J320 Chronic maxillary sinusitis: Secondary | ICD-10-CM

## 2017-05-05 DIAGNOSIS — E119 Type 2 diabetes mellitus without complications: Secondary | ICD-10-CM

## 2017-05-05 DIAGNOSIS — M5136 Other intervertebral disc degeneration, lumbar region: Secondary | ICD-10-CM

## 2017-05-05 LAB — CBC WITH DIFFERENTIAL/PLATELET
BASOS PCT: 0 %
Basophils Absolute: 0 10*3/uL (ref 0.0–0.1)
Eosinophils Absolute: 1 10*3/uL — ABNORMAL HIGH (ref 0.0–0.5)
Eosinophils Relative: 12 %
HEMATOCRIT: 35.7 % (ref 34.8–46.6)
Hemoglobin: 11.6 g/dL (ref 11.6–15.9)
Lymphocytes Relative: 22 %
Lymphs Abs: 1.8 10*3/uL (ref 0.9–3.3)
MCH: 29.3 pg (ref 25.1–34.0)
MCHC: 32.5 g/dL (ref 31.5–36.0)
MCV: 90.2 fL (ref 79.5–101.0)
MONO ABS: 0.8 10*3/uL (ref 0.1–0.9)
Monocytes Relative: 10 %
NEUTROS ABS: 4.4 10*3/uL (ref 1.5–6.5)
NEUTROS PCT: 56 %
PLATELETS: 209 10*3/uL (ref 145–400)
RBC: 3.96 MIL/uL (ref 3.70–5.45)
RDW: 13.4 % (ref 11.2–16.1)
WBC: 7.9 10*3/uL (ref 3.9–10.3)

## 2017-05-05 LAB — COMPREHENSIVE METABOLIC PANEL
ALK PHOS: 93 U/L (ref 40–150)
ALT: 8 U/L (ref 0–55)
ANION GAP: 9 (ref 3–11)
AST: 14 U/L (ref 5–34)
Albumin: 3.6 g/dL (ref 3.5–5.0)
BUN: 15 mg/dL (ref 7–26)
CALCIUM: 9.3 mg/dL (ref 8.4–10.4)
CO2: 27 mmol/L (ref 22–29)
Chloride: 101 mmol/L (ref 98–109)
Creatinine, Ser: 1.22 mg/dL — ABNORMAL HIGH (ref 0.60–1.10)
GFR, EST AFRICAN AMERICAN: 50 mL/min — AB (ref >60)
GFR, EST NON AFRICAN AMERICAN: 43 mL/min — AB (ref >60)
GLUCOSE: 86 mg/dL (ref 70–140)
Potassium: 3.9 mmol/L (ref 3.3–4.7)
Sodium: 137 mmol/L (ref 136–145)
TOTAL PROTEIN: 6.5 g/dL (ref 6.4–8.3)

## 2017-05-05 MED ORDER — SODIUM CHLORIDE 0.9 % IV SOLN
Freq: Once | INTRAVENOUS | Status: AC
  Administered 2017-05-05: 13:00:00 via INTRAVENOUS

## 2017-05-05 MED ORDER — HYDROCODONE-ACETAMINOPHEN 10-325 MG PO TABS: 1.0000 | ORAL_TABLET | Freq: Four times a day (QID) | ORAL | 0 refills | Status: DC | PRN

## 2017-05-05 MED ORDER — ALBUTEROL SULFATE HFA 108 (90 BASE) MCG/ACT IN AERS: 2.0000 | INHALATION_SPRAY | RESPIRATORY_TRACT | 5 refills | Status: DC | PRN

## 2017-05-05 MED ORDER — SODIUM CHLORIDE 0.9 % IV SOLN
240.0000 mg | Freq: Once | INTRAVENOUS | Status: AC
  Administered 2017-05-05: 240 mg via INTRAVENOUS
  Filled 2017-05-05: qty 24

## 2017-05-05 MED ORDER — SODIUM CHLORIDE 0.9% FLUSH
10.0000 mL | INTRAVENOUS | Status: DC | PRN
  Filled 2017-05-05: qty 10

## 2017-05-05 MED ORDER — ALBUTEROL SULFATE HFA 108 (90 BASE) MCG/ACT IN AERS: 2.0000 | INHALATION_SPRAY | Freq: Four times a day (QID) | RESPIRATORY_TRACT | 5 refills | Status: DC | PRN

## 2017-05-05 NOTE — Assessment & Plan Note (Signed)
Continue to follow with oncology for your immunotherapy Please continue your hydrocodone as directed

## 2017-05-05 NOTE — Assessment & Plan Note (Addendum)
This is a very pleasant 78 year3 old white female with recurrent non-small cell lung cancer, adenocarcinoma that was initially diagnosed as unresectable stage IIA in December 2012 status post systemic chemotherapy as well as immunotherapy.  She is currently undergoing treatment with immunotherapy with Nivolumab 240 mg IV every 2 weeks.  She is status post 5 cycles of the new regimen. She has been tolerating this treatment fairly well with no significant adverse effects.  The patient's daughter was present at this visit, but missed the last visit and asked me to review the restaging CT scan results with her.  I have explained to her that the CT scan overall shows stable disease with only a slight increase in the pulmonary nodule.  Discussed that we will pay attention to this on future scans.  For now the plan is to continue the current course of therapy with Nivolumab 240 mg every 2 weeks.  Recommend that she proceed with cycle 6 today as scheduled.  She will return in 2 weeks for evaluation prior to cycle #7.  For COPD, she would continue her current inhaler and treatment as prescribed by [name].  She sees pulmonology later today.  I discussed with the patient and her family that I do not feel that additional antibiotics are indicated since she is afebrile and her white blood cell count is normal.  She has not really noticed an improvement in her symptoms with 2 courses of antibiotics.  We discussed using her Flonase on a regular basis and to try using Mucinex 1 tablet twice a day for 7-10 days to see if her symptoms improved.  For her arthritis, recommend that she follow-up with orthopedics.  The family did inquire about management of her pain.  Medication list reviewed and explained to the patient that she has tramadol and hydrocodone at home which can both help control her pain.  I recommend against long-term use of NSAIDs in this patient given her cardiac history, risk for worsening renal function,  and risk for GI irritation.  She was advised to call immediately if she has any concerning symptoms in the interval. The patient voices understanding of current disease status and treatment options and is in agreement with the current care plan. All questions were answered. The patient knows to call the clinic with any problems, questions or concerns. We can certainly see the patient much sooner if necessary.

## 2017-05-05 NOTE — Addendum Note (Signed)
Addended by: Lorretta Harp on: 05/05/2017 04:22 PM   Modules accepted: Orders

## 2017-05-05 NOTE — Patient Instructions (Signed)
[facility] Discharge Instructions for Patients Receiving Chemotherapy  Today you received the following chemotherapy agents: Nivolumab  To help prevent nausea and vomiting after your treatment, we encourage you to take your nausea medication as directed.    If you develop nausea and vomiting that is not controlled by your nausea medication, call the clinic.   BELOW ARE SYMPTOMS THAT SHOULD BE REPORTED IMMEDIATELY:  *FEVER GREATER THAN 100.5 F  *CHILLS WITH OR WITHOUT FEVER  NAUSEA AND VOMITING THAT IS NOT CONTROLLED WITH YOUR NAUSEA MEDICATION  *UNUSUAL SHORTNESS OF BREATH  *UNUSUAL BRUISING OR BLEEDING  TENDERNESS IN MOUTH AND THROAT WITH OR WITHOUT PRESENCE OF ULCERS  *URINARY PROBLEMS  *BOWEL PROBLEMS  UNUSUAL RASH Items with * indicate a potential emergency and should be followed up as soon as possible.  Feel free to call the clinic should you have any questions or concerns. The clinic phone number is [phone].  Please show the CHEMO ALERT CARD at check-in to the Emergency Department and triage nurse.   

## 2017-05-05 NOTE — Progress Notes (Signed)
Subjective:    Patient ID: [name], female    DOB: 01/17/1946, 72 y.o.   MRN: [id number] HPI History of Present Illness:   ROV 01/03/17 -- This follow-up visit for patient with a history of severe COPD, stage IIIa adenocarcinoma the right upper lobe. Most recent CT scan of the chest 11/11/16 that I have reviewed. This shows large scale stability but a slow progressive enlargement of a masslike predominantly groundglass attenuation and septal thickening of the right middle lobe. She remains on immunotherapy. She reports a couple of episodes of being awakened with acute dyspnea. She is having more cough for the last 2 weeks. Productive of green. She is having from L back pain. She has wheeze through the day. She has been followed by Dr Burt Knack with Cardiology, was to have a stress test but she did not attend.   ROV 05/05/17 --[name] is 52 and has a history of severe COPD and chronic hypoxemic respiratory failure, stage III a adenocarcinoma of the right upper lobe followed by [name].  She is on the nivolumab immunotherapy.  She was seen here 04/13/17 for an acute visit and treated for suspected COPD flare and bronchitis.  She received Augmentin. Had some improvement in congestion. She was then treated by oncology with bactrim. Currently she is having less chest congestion. Has head congestion on flonase, loratadine (or allegra). She does not feel that the Ventolin works for her, wants an alternative.   She is having a lot of back and joint pain, bony pain. She is also dealing w gallstones.        Objective:   Physical Exam Vitals:   05/05/17 1553  BP: (!) 148/62  Pulse: 77  SpO2: 100%  Weight: 172 lb 9.6 oz (78.3 kg)  Height: 5' 5.5" (1.664 m)   Gen: Pleasant, elderly woman, in no distress,  normal affect  ENT: No lesions,  mouth clear,  oropharynx clear, no postnasal drip  Neck: No JVD, no TMG, no carotid bruits  Lungs: No use of accessory muscles, distant, no  wheeze  Cardiovascular: RRR, heart sounds normal, no murmur or gallops, no peripheral edema  Musculoskeletal: No deformities, no cyanosis or clubbing.   Neuro: alert, non focal  Skin: Warm, no lesions or rashes   CBC Latest Ref Rng & Units 05/05/2017 04/07/2017 03/24/2017  WBC 3.9 - 10.3 K/uL 7.9 8.8 7.9  Hemoglobin 11.6 - 15.9 g/dL 11.6 12.7 12.8  Hematocrit 34.8 - 46.6 % 35.7 39.0 38.7  Platelets 145 - 400 K/uL 209 203 223   BMET    Component Value Date/Time   NA 137 05/05/2017 1113   NA 141 04/07/2017 1016   K 3.9 05/05/2017 1113   K 3.4 (L) 04/07/2017 1016   CL 101 05/05/2017 1113   CL 99 07/21/2012 1125   CO2 27 05/05/2017 1113   CO2 27 04/07/2017 1016   GLUCOSE 86 05/05/2017 1113   GLUCOSE 93 04/07/2017 1016   GLUCOSE 184 (H) 07/21/2012 1125   BUN 15 05/05/2017 1113   BUN 13.8 04/07/2017 1016   CREATININE 1.22 (H) 05/05/2017 1113   CREATININE 1.1 04/07/2017 1016   CALCIUM 9.3 05/05/2017 1113   CALCIUM 9.5 04/07/2017 1016   GFRNONAA 43 (L) 05/05/2017 1113   GFRAA 50 (L) 05/05/2017 1113   CT chest 03/24/17 --  COMPARISON:  CT chest 11/10/2016, 08/31/2016  FINDINGS: Cardiovascular: Coronary artery calcification and aortic atherosclerotic calcification.  Mediastinum/Nodes: No axillary or supraclavicular adenopathy. No mediastinal hilar adenopathy. No  pericardial fluid. Small amount of fluid esophagus.  Lungs/Pleura: Geographic region of ground-glass opacity in the RIGHT upper lobe is not changed appreciably in the interval (CT 11/10/2016, 08/31/2016). Region measuring approximately 6.8 by 5.0 cm compared with 7.0 by 4.7 cm.  Along the medial border of this ground-glass opacity, rounded nodular lesion measuring 15 mm by 14 mm increased in size from 11 mm by 8 mm. This lesion is also seen to be larger on coronal imaging (image 78, series 6). This lesion is immediately adjacent to a bronchus.  Band of atelectasis extends inferiorly from the marginal  lesion to the RIGHT hilum also unchanged.  Small to moderate RIGHT pleural effusion is stable.  A band of ground-glass opacity in the superior aspect of the LEFT lower lobe is unchanged (image 53, series 2). Smaller ground-glass focus in the anterior LEFT lower lobe (image 63, series 2) unchanged. Patient status post LEFT upper lobectomy.  Upper Abdomen: Bilateral adrenal adenomas.  Cholelithiasis  Musculoskeletal: No aggressive osseous lesion.  IMPRESSION: 1. Along the medial border of the geographic region of ground-glass opacity in the RIGHT upper lobe , interval increase in rounded nodule adjacent to the bronchus. Findings are concerning for local lung cancer recurrence. Recommend FDG PET scan and potential bronchoscopy for evaluation. 2. Stable  perihilar bandlike atelectasis in the RIGHT lung. 3. Stable RIGHT pleural effusion. 4. Stable ground-glass opacities in LEFT lung     Assessment & Plan:  COPD (chronic obstructive pulmonary disease) (HCC) Recent exacerbation but no evidence of bronchospasm or an exacerbation at this time.  I do believe she is experiencing some slow progressive worsening of her lung disease.  She notes that Ventolin is not effective, wants an alternative  Please continue your Trelegy as you have been taking it We will change her Ventolin since the Ventolin has been ineffective.  We will order pro-air.  You can use 2 puffs up to every 4 hours if needed for shortness of breath Please continue your oxygen as you have been using it   Sinusitis, chronic Continue to use either loratadine or Allegra once a day as you have been doing Increase your fluticasone nasal spray (Flonase) to 2 sprays each nostril twice a day.  Primary cancer of right upper lobe of lung ([place]) Continue to follow with oncology for your immunotherapy Please continue your hydrocodone as directed  Baltazar Apo, MD, PhD 05/05/2017, 4:12 PM [facility] [phone] or if no answer [phone]

## 2017-05-05 NOTE — Assessment & Plan Note (Signed)
Continue to use either loratadine or Allegra once a day as you have been doing Increase your fluticasone nasal spray (Flonase) to 2 sprays each nostril twice a day.

## 2017-05-05 NOTE — Assessment & Plan Note (Signed)
Recent exacerbation but no evidence of bronchospasm or an exacerbation at this time.  I do believe she is experiencing some slow progressive worsening of her lung disease.  She notes that Ventolin is not effective, wants an alternative  Please continue your Trelegy as you have been taking it We will change her Ventolin since the Ventolin has been ineffective.  We will order pro-air.  You can use 2 puffs up to every 4 hours if needed for shortness of breath Please continue your oxygen as you have been using it

## 2017-05-05 NOTE — Progress Notes (Signed)
[facility] OFFICE PROGRESS NOTE  [name], MD [place] [address]  DIAGNOSIS: Recurrent non-small cell lung cancer initially diagnosed as a stage IIA adenocarcinoma in December 2012 with no actionable mutations.   PRIOR THERAPY: 1) Status post radiation therapy to the right upper lobe lung mass completed on 06/04/2011 under the care of Dr. Pablo Ledger.  2) Systemic chemotherapy with carboplatin for AUC of 5 and Alimta 500 mg/M2 giving every 3 weeks, status post 3 cycles, last dose was given on 07/21/2011.  3) Systemic chemotherapy with carboplatin for AUC of 5 and Alimta 500 MG/M2 every 3 weeks. First dose on 01/02/2015. Status post 2 cycles, last dose was given 01/30/2015. Her treatment is currently on hold secondary to intolerance. 4) Immunotherapy with Nivolumab 240 M IV every 2 weeks status post 14 cycles.  5)immunotherapy with Nivolumab 480 mg IV every 4 weeks. Patient is status post 1 cycle. The patient discontinued treatment with Nivolumab every 4 weeks because she want to switch back to every 2 weeks due to increased side effects with the monthly dosing.  CURRENT THERAPY: The patient will resume her treatment again with immunotherapy with Nivolumab 240 mg IV every 2 weeks. First dose 09/15/2016. Status post 5 cycles.  INTERVAL HISTORY: [name] 72 y.o. female returns for routine follow-up visit accompanied by her son and daughter.  The patient missed her last treatment 2 weeks ago due to a COPD exacerbation.  She was initially given Augmentin from her pulmonology office without significant improvement in her symptoms and was seen in her symptom management clinic last week and given a one-week supply of Bactrim.  She has 1 more tablet left to take.  She reports that she has not seen much improvement in her congestion.  The patient denies fevers and chills.  She reports that she has a lot of sinus drainage and fullness in her ears.  Denies  chest pain, cough, hemoptysis.  She has her baseline shortness of breath with exertion is currently on home oxygen.  Denies nausea, vomiting, constipation, diarrhea.  She is having a lot of pain due to her arthritis and plans to follow-up with orthopedics.  Her family is questioning whether or not she can use anti-inflammatories for treatment of her pain.  The patient is here for evaluation prior to cycle 6 of her Nivolumab.  MEDICAL HISTORY: Past Medical History:  Diagnosis Date  . Adenocarcinoma, lung ([place])    upper left lobe  . Anemia in neoplastic disease   . Anxiety   . Arthritis    "all over my body"  . Asthma   . Asymptomatic varicose veins   . Atherosclerosis of native arteries of the extremities, unspecified   . Atrial fibrillation ([place])   . Bronchial pneumonia    history  . 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 Chemotherapy adverse reaction 09/15/11   "makes me light headed and pass out; this is 3rd blood transfusion since going thru it"  . CHF (congestive heart failure) ([place])   . Chronic airway obstruction, not elsewhere classified   . Chronic fatigue 08/19/2015  . Chronic sinusitis   . Complication of anesthesia    slow to wake up  . COPD with emphysema ([place])   . Coronary artery disease    Dr. Burt Knack had stress test done  . DDD (degenerative disc disease), lumbar   . Depressive disorder, not elsewhere classified   . Diseases of lips   .  Disorder of bone and cartilage, unspecified   . DVT (deep venous thrombosis) ([place])   . Fibromyalgia   . Functional urinary incontinence   . GERD (gastroesophageal reflux disease)   . Hard of hearing, right   . Herpes zoster without mention of complication   . Hip pain, chronic 09/17/2014  . History of blood clots    "2 in my aorta"  . History of blood transfusion   . History of bronchitis   . History of echocardiogram    Echo 11/18: mild LVH, EF 60-65, no RWMA, Gr 1 DD, Ao Sclerosis, mild AI, RVH with low nl RSVF, trivial eff   . Hx of radiation therapy 04/28/11 -06/08/11   right upper lobe-lung  . Hyperlipidemia   . Hypertension   . Kidney infection    "related to chemo"  . Lumbago   . Malignant neoplasm of bronchus and lung, unspecified site   . Mental disorder   . Muscle weakness (generalized)   . Non-small cell lung cancer ([place]) 03/31/2011   Adenocarcinoma Rt upper lobe mass  . Osteoarthrosis, unspecified whether generalized or localized, unspecified site   . Osteoporosis   . Other abnormal blood chemistry   . Other and unspecified hyperlipidemia   . PAD (peripheral artery disease) ([place])   . Pain in thoracic spine   . 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 Primary cancer of right upper lobe of lung ([place]) 02/08/2011   Biopsy Rt upper lobe mass 03/31/11>>adenocarcinoma DIAGNOSIS: Stage IIB/IIIA non-small cell lung cancer consistent with adenocarcinoma diagnosed in December 2012.   PRIOR THERAPY:  1) Status post radiation therapy to the right upper lobe lung mass completed on 06/04/2011 under the care of Dr. Pablo Ledger.  2) Systemic chemotherapy with carboplatin for AUC of 5 and Alimta 500 mg/M2 giving every 3   . Reflux   . Reflux esophagitis   . Respiratory failure with hypoxia 01/02/2010  . Restless legs syndrome (RLS)   . Scoliosis   . Screening for thyroid disorder   . Shortness of breath    "anytime really"  . Sinus headache    "all the time"  . Spasm of muscle   . Syncope and collapse   . Type II or unspecified type diabetes mellitus with neurological manifestations, uncontrolled(250.62)   . Type II or unspecified type diabetes mellitus without mention of complication, not stated as uncontrolled   . Type II or unspecified type diabetes mellitus without mention of complication, not stated as uncontrolled   . Unspecified constipation   . Unspecified essential hypertension   . Unspecified hearing loss   . Unspecified hereditary and idiopathic peripheral neuropathy   . Unspecified vitamin D  deficiency     ALLERGIES:  is allergic to iohexol; other; anoro ellipta [umeclidinium-vilanterol]; codeine; and protonix [pantoprazole sodium].  MEDICATIONS:  Current Outpatient Medications  Medication Sig Dispense Refill  . ACCU-CHEK SOFTCLIX LANCETS lancets by Other route. Check blood sugar twice daily. Dx: E11.9, E11.49    . albuterol (PROVENTIL) (2.5 MG/3ML) 0.083% nebulizer solution Take 3 mLs (2.5 mg total) by nebulization every 6 (six) hours as needed for wheezing or shortness of breath. 75 mL 5  . ALPRAZolam (XANAX) 1 MG tablet TAKE 1 TABLET BY MOUTH TWICE DAILY AS NEEDED FOR ANXIETY 60 tablet 2  . aspirin 81 MG tablet Take 81 mg by mouth daily.    . Blood Glucose Monitoring Suppl (ACCU-CHEK AVIVA PLUS) w/Device KIT Use as directed to check blood sugar Dx: E11.9, E11.49 1 kit 0  .  Calcium Carbonate-Vit D-Min (CALTRATE PLUS PO) Take 1 tablet by mouth daily.    . fexofenadine (ALLEGRA) 180 MG tablet Take 180 mg by mouth daily.    . fluticasone (FLONASE) 50 MCG/ACT nasal spray SHAKE LIQUID WELL AND USE 2 SPRAYS IN EACH NOSTRIL TWICE DAILY 16 g 5  . Fluticasone-Umeclidin-Vilant (TRELEGY ELLIPTA) 100-62.5-25 MCG/INH AEPB Inhale 1 puff daily into the lungs. 1 each 6  . HYDROcodone-acetaminophen (NORCO) 10-325 MG tablet Take 1 tablet by mouth every 6 (six) hours as needed.    . loratadine (CLARITIN) 10 MG tablet TAKE 1 TABLET BY MOUTH EVERY DAY 30 tablet 5  . metFORMIN (GLUCOPHAGE) 500 MG tablet Take 1 tablet (500 mg total) daily as needed by mouth (high blood sugar- 160+). Take one tablet at night for blood sugar 90 tablet 3  . OXYGEN Inhale 4 L into the lungs.    . potassium chloride (K-DUR,KLOR-CON) 10 MEQ tablet Take 1 tablet (10 mEq total) daily by mouth. 90 tablet 3  . ranitidine (ZANTAC) 150 MG tablet Take 1 tablet (150 mg total) by mouth 2 (two) times daily. 180 tablet 3  . senna (SENOKOT) 8.6 MG tablet Take 1 tablet by mouth daily.    Marland Kitchen sulfamethoxazole-trimethoprim (BACTRIM  DS,SEPTRA DS) 800-160 MG tablet Take 1 tablet by mouth 2 (two) times daily. 14 tablet 0  . traMADol (ULTRAM) 50 MG tablet Take 2 tablets (100 mg total) every 6 (six) hours as needed by mouth for severe pain. 90 tablet 1  . triamterene-hydrochlorothiazide (MAXZIDE-25) 37.5-25 MG tablet TAKE 1 TABLET BY MOUTH EVERY DAY FOR BLOOD PRESSURE 90 tablet 3  . albuterol (PROVENTIL HFA;VENTOLIN HFA) 108 (90 Base) MCG/ACT inhaler Inhale 2 puffs into the lungs every 6 (six) hours as needed for wheezing or shortness of breath. (Patient not taking: Reported on 05/05/2017) 1 Inhaler 5  . benzonatate (TESSALON) 100 MG capsule Take 1 capsule (100 mg total) by mouth 3 (three) times daily as needed for cough. (Patient not taking: Reported on 05/05/2017) 40 capsule 1  . citalopram (CELEXA) 20 MG tablet Take 1 tablet (20 mg total) daily by mouth. (Patient not taking: Reported on 04/28/2017) 90 tablet 3  . DIPHENHIST 25 MG capsule TAKE 1 CAPSULE BY MOUTH 2 HOURS PRIOR TO CT SCAN (Patient not taking: Reported on 05/05/2017) 10 capsule 0  . nystatin (MYCOSTATIN) 100000 UNIT/ML suspension Use as directed 5 mLs (500,000 Units total) in the mouth or throat 4 (four) times daily. (Patient not taking: Reported on 05/05/2017) 60 mL 1  . nystatin (MYCOSTATIN/NYSTOP) powder Apply topically 3 (three) times daily. (Patient not taking: Reported on 05/05/2017) 15 g 0  . UNABLE TO FIND Med Name: Cranial Prothesis for Chemotherapy 1 each 0   No current facility-administered medications for this visit.    Facility-Administered Medications Ordered in Other Visits  Medication Dose Route Frequency Provider Last Rate Last Dose  . 0.9 %  sodium chloride infusion   Intravenous Once Curt Bears, MD      . hyaluronate sodium (RADIAPLEXRX) gel   Topical Once Thea Silversmith, MD      . nivolumab (OPDIVO) 240 mg in sodium chloride 0.9 % 100 mL chemo infusion  240 mg Intravenous Once Curt Bears, MD      . sodium chloride flush (NS) 0.9 %  injection 10 mL  10 mL Intravenous PRN Curt Bears, MD      . topical emolient (BIAFINE) emulsion   Topical PRN Thea Silversmith, MD  SURGICAL HISTORY:  Past Surgical History:  Procedure Laterality Date  . BRONCHOSCOPY  02/2011  . CARDIAC CATHETERIZATION    . CATARACT EXTRACTION  03/12/2013   Left Eye   . DILATION AND CURETTAGE OF UTERUS  1980's  . EYE SURGERY Left 2014   Cataract  . FETAL BLOOD TRANSFUSION  09/16/11  . JOINT REPLACEMENT  04/13/10   Left Hip  . LUNG LOBECTOMY  1986   left  . SEPTOPLASTY    . TOTAL HIP ARTHROPLASTY  01/2010   left  . TUBAL LIGATION  1980's    REVIEW OF SYSTEMS:   Review of Systems  Constitutional: Negative for appetite change, chills, fever and unexpected weight change. Positive for fatigue. HENT:   Negative for mouth sores, nosebleeds, sore throat and trouble swallowing.   Eyes: Negative for eye problems and icterus.  Respiratory: Negative for cough, hemoptysis, shortness of breath at rest and wheezing.  Positive for shortness of breath with exertion.  Wears home oxygen. Cardiovascular: Negative for chest pain and leg swelling.  Gastrointestinal: Negative for abdominal pain, constipation, diarrhea, nausea and vomiting.  Genitourinary: Negative for bladder incontinence, difficulty urinating, dysuria, frequency and hematuria.   Musculoskeletal: Positive for arthralgias throughout her body secondary to arthritis..  Skin: Negative for itching and rash.  Neurological: Negative for dizziness, extremity weakness, gait problem, headaches, light-headedness and seizures.  Hematological: Negative for adenopathy. Does not bruise/bleed easily.  Psychiatric/Behavioral: Negative for confusion, depression and sleep disturbance. The patient is not nervous/anxious.     PHYSICAL EXAMINATION:  Blood pressure 109/67, pulse 74, temperature 98.9 F (37.2 C), temperature source Oral, resp. rate 16, height _0  (1.651 m), weight 170 lb 12.8 oz (77.5 kg),  SpO2 98 %.  ECOG PERFORMANCE STATUS: 1 - Symptomatic but completely ambulatory  Physical Exam  Constitutional: Oriented to person, place, and time and well-developed, well-nourished, and in no distress. No distress.  HENT:  Head: Normocephalic and atraumatic.  Mouth/Throat: Oropharynx is clear and moist. No oropharyngeal exudate.  Eyes: Conjunctivae are normal. Right eye exhibits no discharge. Left eye exhibits no discharge. No scleral icterus.  Neck: Normal range of motion. Neck supple.  Cardiovascular: Normal rate, regular rhythm, normal heart sounds and intact distal pulses.   Pulmonary/Chest: Effort normal. No respiratory distress.  Faint expiratory wheezes in her posterior lung fields. No rales.  Abdominal: Soft. Bowel sounds are normal. Exhibits no distension and no mass. There is no tenderness.  Musculoskeletal: Normal range of motion. Exhibits no edema.  Lymphadenopathy:    No cervical adenopathy.  Neurological: Alert and oriented to person, place, and time. Exhibits normal muscle tone. Coordination normal.  Skin: Skin is warm and dry. No rash noted. Not diaphoretic. No erythema. No pallor.  Psychiatric: Mood, memory and judgment normal.  Vitals reviewed.  LABORATORY DATA: Lab Results  Component Value Date   WBC 7.9 05/05/2017   HGB 11.6 05/05/2017   HCT 35.7 05/05/2017   MCV 90.2 05/05/2017   PLT 209 05/05/2017      Chemistry      Component Value Date/Time   NA 137 05/05/2017 1113   NA 141 04/07/2017 1016   K 3.9 05/05/2017 1113   K 3.4 (L) 04/07/2017 1016   CL 101 05/05/2017 1113   CL 99 07/21/2012 1125   CO2 27 05/05/2017 1113   CO2 27 04/07/2017 1016   BUN 15 05/05/2017 1113   BUN 13.8 04/07/2017 1016   CREATININE 1.22 (H) 05/05/2017 1113   CREATININE 1.1 04/07/2017 1016  Component Value Date/Time   CALCIUM 9.3 05/05/2017 1113   CALCIUM 9.5 04/07/2017 1016   ALKPHOS 93 05/05/2017 1113   ALKPHOS 94 04/07/2017 1016   AST 14 05/05/2017 1113   AST 13  04/07/2017 1016   ALT 8 05/05/2017 1113   ALT 8 04/07/2017 1016   BILITOT <0.2 (L) 05/05/2017 1113   BILITOT 0.36 04/07/2017 1016       RADIOGRAPHIC STUDIES:  No results found.   ASSESSMENT/PLAN:  Primary cancer of right upper lobe of lung [facility]) This is a very pleasant 39 year3 old white female with recurrent non-small cell lung cancer, adenocarcinoma that was initially diagnosed as unresectable stage IIA in December 2012 status post systemic chemotherapy as well as immunotherapy.  She is currently undergoing treatment with immunotherapy with Nivolumab 240 mg IV every 2 weeks.  She is status post 5 cycles of the new regimen. She has been tolerating this treatment fairly well with no significant adverse effects.  The patient's daughter was present at this visit, but missed the last visit and asked me to review the restaging CT scan results with her.  I have explained to her that the CT scan overall shows stable disease with only a slight increase in the pulmonary nodule.  Discussed that we will pay attention to this on future scans.  For now the plan is to continue the current course of therapy with Nivolumab 240 mg every 2 weeks.  Recommend that she proceed with cycle 6 today as scheduled.  She will return in 2 weeks for evaluation prior to cycle #7.  For COPD, she would continue her current inhaler and treatment as prescribed by [name].  She sees pulmonology later today.  I discussed with the patient and her family that I do not feel that additional antibiotics are indicated since she is afebrile and her white blood cell count is normal.  She has not really noticed an improvement in her symptoms with 2 courses of antibiotics.  We discussed using her Flonase on a regular basis and to try using Mucinex 1 tablet twice a day for 7-10 days to see if her symptoms improved.  For her arthritis, recommend that she follow-up with orthopedics.  The family did inquire about management of her pain.   Medication list reviewed and explained to the patient that she has tramadol and hydrocodone at home which can both help control her pain.  I recommend against long-term use of NSAIDs in this patient given her cardiac history, risk for worsening renal function, and risk for GI irritation.  She was advised to call immediately if she has any concerning symptoms in the interval. The patient voices understanding of current disease status and treatment options and is in agreement with the current care plan. All questions were answered. The patient knows to call the clinic with any problems, questions or concerns. We can certainly see the patient much sooner if necessary.  No orders of the defined types were placed in this encounter.  Mikey Bussing, DNP, AGPCNP-BC, AOCNP 05/05/17

## 2017-05-05 NOTE — Patient Instructions (Signed)
Please continue your Trelegy as you have been taking it We will change her Ventolin since the Ventolin has been ineffective.  We will order pro-air.  You can use 2 puffs up to every 4 hours if needed for shortness of breath Please continue your oxygen as you have been using it Continue to use either loratadine or Allegra once a day as you have been doing Increase your fluticasone nasal spray (Flonase) to 2 sprays each nostril twice a day. Continue to follow with oncology for your immunotherapy Please continue your hydrocodone as directed Follow with Dr Lamonte Sakai in 2 months or sooner if you have any problems.

## 2017-05-05 NOTE — Telephone Encounter (Signed)
Scheduled appt per 1/24 los - Patient to get an updated schedule next visit.

## 2017-05-09 ENCOUNTER — Telehealth: Payer: Self-pay | Admitting: Emergency Medicine

## 2017-05-09 NOTE — Telephone Encounter (Signed)
lmtcb

## 2017-05-09 NOTE — Telephone Encounter (Signed)
LMOM TCB x1 following the guidelines that pt left below

## 2017-05-09 NOTE — Telephone Encounter (Signed)
Patient returning call- she can be reached before 1pm at [phone], after 1pm [phone] -pr

## 2017-05-10 MED ORDER — FLUCONAZOLE 100 MG PO TABS: 100.0000 mg | ORAL_TABLET | Freq: Every day | ORAL | 0 refills | Status: DC

## 2017-05-10 NOTE — Telephone Encounter (Signed)
Pt calling back. Cb is [phone] (home)

## 2017-05-10 NOTE — Telephone Encounter (Signed)
Protocol fluconazole 100 mg daily for the next 3 days With regard to the omega vitamin, no reason why she cannot use this.  Okay to try it and see if she feels a benefit.

## 2017-05-10 NOTE — Telephone Encounter (Signed)
Called pt and her son advised me to call her home number.  I called her home number, no answer, and unable to leave message. Will try again later.

## 2017-05-10 NOTE — Telephone Encounter (Signed)
Called pt and advised message from the provider. Pt understood and verbalized understanding. Nothing further is needed.   Spoke with pt and advised rx sent to pharmacy   

## 2017-05-10 NOTE — Telephone Encounter (Signed)
Spoke with pt, she states she has been on ABX for about 3 weeks and this has caused a yeast infection. She forgot to ask RB for this when she was seen.   She also wanted to ask RB about Omega XL. She wants to know if she can take this vitamin and if it would effect her infusion for her cancer or effect the medications she is taking. It helps with muscles and bones and thinks it may help her cancer. Please advise.   [name]

## 2017-05-12 DIAGNOSIS — M47812 Spondylosis without myelopathy or radiculopathy, cervical region: Secondary | ICD-10-CM | POA: Diagnosis not present

## 2017-05-12 DIAGNOSIS — M79641 Pain in right hand: Secondary | ICD-10-CM | POA: Diagnosis not present

## 2017-05-12 DIAGNOSIS — M7582 Other shoulder lesions, left shoulder: Secondary | ICD-10-CM | POA: Diagnosis not present

## 2017-05-12 DIAGNOSIS — M7581 Other shoulder lesions, right shoulder: Secondary | ICD-10-CM | POA: Diagnosis not present

## 2017-05-16 NOTE — Telephone Encounter (Signed)
Handled..[name]

## 2017-05-17 ENCOUNTER — Telehealth: Payer: Self-pay | Admitting: Emergency Medicine

## 2017-05-17 DIAGNOSIS — R1312 Dysphagia, oropharyngeal phase: Secondary | ICD-10-CM

## 2017-05-17 DIAGNOSIS — J9611 Chronic respiratory failure with hypoxia: Secondary | ICD-10-CM

## 2017-05-17 NOTE — Telephone Encounter (Signed)
Attempted to call [facility] to see if humidity on POC can be adjusted or added but they close at 5:00. wcb 05/18/17.

## 2017-05-17 NOTE — Telephone Encounter (Signed)
Most O2 concentrators have humidity already. I don't think humidification for portable exists

## 2017-05-17 NOTE — Telephone Encounter (Signed)
Spoke with pt, she stated she would like the PA done for ProAir because the alternative Ventolin doesn't work well for her. I attempted the PA with [employer] to start PA. It was approved until 04/11/2018. I called her pharmacy to let them know and advised pt.   She also wanted to see if RB would place an order for humidifier for her oxygen. She states her nose is very dry and thinks this would help keep her nose moisturized. RB please advise if we can send in order to [facility].   [facility]

## 2017-05-18 NOTE — Telephone Encounter (Signed)
Spoke with [facility] and they state they can add humidifier by adding a Water bottle to the concentrator. We could just place an order for this. RB ok to send order?

## 2017-05-18 NOTE — Telephone Encounter (Signed)
Placed order nothing further needed.

## 2017-05-18 NOTE — Telephone Encounter (Signed)
Yes Ok

## 2017-05-19 ENCOUNTER — Inpatient Hospital Stay: Payer: Medicare Other

## 2017-05-19 ENCOUNTER — Other Ambulatory Visit: Payer: Medicare Other

## 2017-05-19 ENCOUNTER — Encounter: Payer: Self-pay | Admitting: Internal Medicine

## 2017-05-19 ENCOUNTER — Telehealth: Payer: Self-pay | Admitting: Emergency Medicine

## 2017-05-19 ENCOUNTER — Inpatient Hospital Stay (HOSPITAL_BASED_OUTPATIENT_CLINIC_OR_DEPARTMENT_OTHER): Payer: Medicare Other | Admitting: Internal Medicine

## 2017-05-19 ENCOUNTER — Telehealth: Payer: Self-pay | Admitting: Internal Medicine

## 2017-05-19 ENCOUNTER — Inpatient Hospital Stay: Payer: Medicare Other | Attending: Medical

## 2017-05-19 VITALS — BP 120/56 | HR 71 | Temp 98.5°F | Resp 18 | Ht 65.5 in | Wt 170.4 lb

## 2017-05-19 DIAGNOSIS — Z5112 Encounter for antineoplastic immunotherapy: Secondary | ICD-10-CM | POA: Insufficient documentation

## 2017-05-19 DIAGNOSIS — C3411 Malignant neoplasm of upper lobe, right bronchus or lung: Secondary | ICD-10-CM | POA: Diagnosis not present

## 2017-05-19 DIAGNOSIS — Z9981 Dependence on supplemental oxygen: Secondary | ICD-10-CM | POA: Diagnosis not present

## 2017-05-19 DIAGNOSIS — J449 Chronic obstructive pulmonary disease, unspecified: Secondary | ICD-10-CM | POA: Diagnosis not present

## 2017-05-19 LAB — CBC WITH DIFFERENTIAL/PLATELET
BASOS PCT: 1 %
Basophils Absolute: 0 10*3/uL (ref 0.0–0.1)
EOS ABS: 0.9 10*3/uL — AB (ref 0.0–0.5)
EOS PCT: 11 %
HCT: 36.9 % (ref 34.8–46.6)
Hemoglobin: 12.4 g/dL (ref 11.6–15.9)
Lymphocytes Relative: 26 %
Lymphs Abs: 2.2 10*3/uL (ref 0.9–3.3)
MCH: 30 pg (ref 25.1–34.0)
MCHC: 33.6 g/dL (ref 31.5–36.0)
MCV: 89.3 fL (ref 79.5–101.0)
MONO ABS: 0.8 10*3/uL (ref 0.1–0.9)
Monocytes Relative: 10 %
Neutro Abs: 4.5 10*3/uL (ref 1.5–6.5)
Neutrophils Relative %: 52 %
PLATELETS: 221 10*3/uL (ref 145–400)
RBC: 4.13 MIL/uL (ref 3.70–5.45)
RDW: 13.2 % (ref 11.2–14.5)
WBC: 8.5 10*3/uL (ref 3.9–10.3)

## 2017-05-19 LAB — COMPREHENSIVE METABOLIC PANEL
ALK PHOS: 96 U/L (ref 40–150)
AST: 13 U/L (ref 5–34)
Albumin: 3.5 g/dL (ref 3.5–5.0)
Anion gap: 9 (ref 3–11)
BUN: 15 mg/dL (ref 7–26)
CALCIUM: 9.6 mg/dL (ref 8.4–10.4)
CO2: 28 mmol/L (ref 22–29)
CREATININE: 0.88 mg/dL (ref 0.60–1.10)
Chloride: 102 mmol/L (ref 98–109)
Glucose, Bld: 96 mg/dL (ref 70–140)
Potassium: 3.7 mmol/L (ref 3.5–5.1)
SODIUM: 139 mmol/L (ref 136–145)
Total Bilirubin: 0.3 mg/dL (ref 0.2–1.2)
Total Protein: 6.5 g/dL (ref 6.4–8.3)

## 2017-05-19 MED ORDER — SODIUM CHLORIDE 0.9 % IV SOLN
240.0000 mg | Freq: Once | INTRAVENOUS | Status: AC
  Administered 2017-05-19: 240 mg via INTRAVENOUS
  Filled 2017-05-19: qty 24

## 2017-05-19 MED ORDER — SODIUM CHLORIDE 0.9 % IV SOLN
Freq: Once | INTRAVENOUS | Status: AC
  Administered 2017-05-19: 13:00:00 via INTRAVENOUS

## 2017-05-19 NOTE — Telephone Encounter (Signed)
RB please advise patient states she was supposed to get quantity 60

## 2017-05-19 NOTE — Progress Notes (Signed)
[facility] Telephone:([phone]) [phone]   Fax:([phone]) [phone]  OFFICE PROGRESS NOTE  [name], MD [place] [address]  DIAGNOSIS: Recurrent non-small cell lung cancer initially diagnosed as a stage IIA adenocarcinoma in December 2012 with no actionable mutations.   PRIOR THERAPY: 1) Status post radiation therapy to the right upper lobe lung mass completed on 06/04/2011 under the care of Dr. Pablo Ledger.  2) Systemic chemotherapy with carboplatin for AUC of 5 and Alimta 500 mg/M2 giving every 3 weeks, status post 3 cycles, last dose was given on 07/21/2011.  3) Systemic chemotherapy with carboplatin for AUC of 5 and Alimta 500 MG/M2 every 3 weeks. First dose on 01/02/2015. Status post 2 cycles, last dose was given 01/30/2015. Her treatment is currently on hold secondary to intolerance. 4) Immunotherapy with Nivolumab 240 M IV every 2 weeks status post 14 cycles.  5)immunotherapy with Nivolumab 480 mg IV every 4 weeks. Patient is status post 1 cycle. The patient discontinued treatment with Nivolumab every 4 weeks because she want to switch back to every 2 weeks due to increased side effects with the monthly dosing.  CURRENT THERAPY: The patient will resume her treatment again with immunotherapy with Nivolumab 240 mg IV every 2 weeks. First dose 09/15/2016. Status post 6 cycles  INTERVAL HISTORY: [name] 72 y.o. female returns to the clinic today for follow-up visit accompanied by her son.  The patient is feeling fine today with no specific complaints except for fatigue and shortness of breath secondary to COPD.  She is currently on home oxygen.  She denied having any recent chest pain, cough or hemoptysis.  She has no weight loss or night sweats.  The patient denied having any significant nausea, vomiting, diarrhea or constipation.  She is here today for evaluation before starting cycle #7 of her treatment.  MEDICAL HISTORY: Past Medical  History:  Diagnosis Date  . Adenocarcinoma, lung ([place])    upper left lobe  . Anemia in neoplastic disease   . Anxiety   . Arthritis    "all over my body"  . Asthma   . Asymptomatic varicose veins   . Atherosclerosis of native arteries of the extremities, unspecified   . Atrial fibrillation ([name])   . Bronchial pneumonia    history  . 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 Chemotherapy adverse reaction 09/15/11   "makes me light headed and pass out; this is 3rd blood transfusion since going thru it"  . CHF (congestive heart failure) ([place])   . Chronic airway obstruction, not elsewhere classified   . Chronic fatigue 08/19/2015  . Chronic sinusitis   . Complication of anesthesia    slow to wake up  . COPD with emphysema ([place])   . Coronary artery disease    Dr. Burt Knack had stress test done  . DDD (degenerative disc disease), lumbar   . Depressive disorder, not elsewhere classified   . Diseases of lips   . Disorder of bone and cartilage, unspecified   . DVT (deep venous thrombosis) ([facility])   . Fibromyalgia   . Functional urinary incontinence   . GERD (gastroesophageal reflux disease)   . Hard of hearing, right   . Herpes zoster without mention of complication   . Hip pain, chronic 09/17/2014  . History of blood clots    "2 in my aorta"  . History of blood transfusion   . History of bronchitis   .  History of echocardiogram    Echo 11/18: mild LVH, EF 60-65, no RWMA, Gr 1 DD, Ao Sclerosis, mild AI, RVH with low nl RSVF, trivial eff  . Hx of radiation therapy 04/28/11 -06/08/11   right upper lobe-lung  . Hyperlipidemia   . Hypertension   . Kidney infection    "related to chemo"  . Lumbago   . Malignant neoplasm of bronchus and lung, unspecified site   . Mental disorder   . Muscle weakness (generalized)   . Non-small cell lung cancer ([place]) 03/31/2011   Adenocarcinoma Rt upper lobe mass  . Osteoarthrosis, unspecified whether generalized or localized, unspecified site   .  Osteoporosis   . Other abnormal blood chemistry   . Other and unspecified hyperlipidemia   . PAD (peripheral artery disease) ([facility])   . Pain in thoracic spine   . 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 Primary cancer of right upper lobe of lung ([facility]) 02/08/2011   Biopsy Rt upper lobe mass 03/31/11>>adenocarcinoma DIAGNOSIS: Stage IIB/IIIA non-small cell lung cancer consistent with adenocarcinoma diagnosed in December 2012.   PRIOR THERAPY:  1) Status post radiation therapy to the right upper lobe lung mass completed on 06/04/2011 under the care of Dr. Pablo Ledger.  2) Systemic chemotherapy with carboplatin for AUC of 5 and Alimta 500 mg/M2 giving every 3   . Reflux   . Reflux esophagitis   . Respiratory failure with hypoxia 01/02/2010  . Restless legs syndrome (RLS)   . Scoliosis   . Screening for thyroid disorder   . Shortness of breath    "anytime really"  . Sinus headache    "all the time"  . Spasm of muscle   . Syncope and collapse   . Type II or unspecified type diabetes mellitus with neurological manifestations, uncontrolled(250.62)   . Type II or unspecified type diabetes mellitus without mention of complication, not stated as uncontrolled   . Type II or unspecified type diabetes mellitus without mention of complication, not stated as uncontrolled   . Unspecified constipation   . Unspecified essential hypertension   . Unspecified hearing loss   . Unspecified hereditary and idiopathic peripheral neuropathy   . Unspecified vitamin D deficiency     ALLERGIES:  is allergic to iohexol; other; anoro ellipta [umeclidinium-vilanterol]; codeine; and protonix [pantoprazole sodium].  MEDICATIONS:  Current Outpatient Medications  Medication Sig Dispense Refill  . ACCU-CHEK SOFTCLIX LANCETS lancets by Other route. Check blood sugar twice daily. Dx: E11.9, E11.49    . albuterol (PROAIR HFA) 108 (90 Base) MCG/ACT inhaler Inhale 2 puffs into the lungs every 4 (four) hours as needed for  wheezing or shortness of breath. 1 Inhaler 5  . albuterol (PROVENTIL) (2.5 MG/3ML) 0.083% nebulizer solution Take 3 mLs (2.5 mg total) by nebulization every 6 (six) hours as needed for wheezing or shortness of breath. 75 mL 5  . ALPRAZolam (XANAX) 1 MG tablet TAKE 1 TABLET BY MOUTH TWICE DAILY AS NEEDED FOR ANXIETY 60 tablet 2  . aspirin 81 MG tablet Take 81 mg by mouth daily.    . benzonatate (TESSALON) 100 MG capsule Take 1 capsule (100 mg total) by mouth 3 (three) times daily as needed for cough. (Patient not taking: Reported on 05/05/2017) 40 capsule 1  . Blood Glucose Monitoring Suppl (ACCU-CHEK AVIVA PLUS) w/Device KIT Use as directed to check blood sugar Dx: E11.9, E11.49 1 kit 0  . Calcium Carbonate-Vit D-Min (CALTRATE PLUS PO) Take 1 tablet by mouth daily.    Marland Kitchen DIPHENHIST  25 MG capsule TAKE 1 CAPSULE BY MOUTH 2 HOURS PRIOR TO CT SCAN (Patient not taking: Reported on 05/05/2017) 10 capsule 0  . fexofenadine (ALLEGRA) 180 MG tablet Take 180 mg by mouth daily.    . fluconazole (DIFLUCAN) 100 MG tablet Take 1 tablet (100 mg total) by mouth daily. 3 tablet 0  . fluticasone (FLONASE) 50 MCG/ACT nasal spray SHAKE LIQUID WELL AND USE 2 SPRAYS IN EACH NOSTRIL TWICE DAILY 16 g 5  . Fluticasone-Umeclidin-Vilant (TRELEGY ELLIPTA) 100-62.5-25 MCG/INH AEPB Inhale 1 puff daily into the lungs. 1 each 6  . HYDROcodone-acetaminophen (NORCO) 10-325 MG tablet Take 1 tablet by mouth every 6 (six) hours as needed. 30 tablet 0  . loratadine (CLARITIN) 10 MG tablet TAKE 1 TABLET BY MOUTH EVERY DAY 30 tablet 5  . metFORMIN (GLUCOPHAGE) 500 MG tablet Take 1 tablet (500 mg total) daily as needed by mouth (high blood sugar- 160+). Take one tablet at night for blood sugar 90 tablet 3  . OXYGEN Inhale 4 L into the lungs.    . potassium chloride (K-DUR,KLOR-CON) 10 MEQ tablet Take 1 tablet (10 mEq total) daily by mouth. 90 tablet 3  . ranitidine (ZANTAC) 150 MG tablet Take 1 tablet (150 mg total) by mouth 2 (two) times  daily. 180 tablet 3  . senna (SENOKOT) 8.6 MG tablet Take 1 tablet by mouth daily.    Marland Kitchen sulfamethoxazole-trimethoprim (BACTRIM DS,SEPTRA DS) 800-160 MG tablet Take 1 tablet by mouth 2 (two) times daily. (Patient not taking: Reported on 05/05/2017) 14 tablet 0  . traMADol (ULTRAM) 50 MG tablet Take 2 tablets (100 mg total) every 6 (six) hours as needed by mouth for severe pain. 90 tablet 1  . triamterene-hydrochlorothiazide (MAXZIDE-25) 37.5-25 MG tablet TAKE 1 TABLET BY MOUTH EVERY DAY FOR BLOOD PRESSURE 90 tablet 3  . UNABLE TO FIND Med Name: Cranial Prothesis for Chemotherapy (Patient not taking: Reported on 05/05/2017) 1 each 0   No current facility-administered medications for this visit.    Facility-Administered Medications Ordered in Other Visits  Medication Dose Route Frequency Provider Last Rate Last Dose  . hyaluronate sodium (RADIAPLEXRX) gel   Topical Once Thea Silversmith, MD      . sodium chloride flush (NS) 0.9 % injection 10 mL  10 mL Intravenous PRN Curt Bears, MD      . topical emolient (BIAFINE) emulsion   Topical PRN Thea Silversmith, MD        SURGICAL HISTORY:  Past Surgical History:  Procedure Laterality Date  . BRONCHOSCOPY  02/2011  . CARDIAC CATHETERIZATION    . CATARACT EXTRACTION  03/12/2013   Left Eye   . DILATION AND CURETTAGE OF UTERUS  1980's  . EYE SURGERY Left 2014   Cataract  . FETAL BLOOD TRANSFUSION  09/16/11  . JOINT REPLACEMENT  04/13/10   Left Hip  . LUNG LOBECTOMY  1986   left  . SEPTOPLASTY    . TOTAL HIP ARTHROPLASTY  01/2010   left  . TUBAL LIGATION  1980's    REVIEW OF SYSTEMS:  A comprehensive review of systems was negative except for: Constitutional: positive for fatigue Respiratory: positive for dyspnea on exertion   PHYSICAL EXAMINATION: General appearance: alert, cooperative, fatigued and mild distress Head: Normocephalic, without obvious abnormality, atraumatic Neck: no adenopathy, no JVD, supple, symmetrical, trachea midline  and thyroid not enlarged, symmetric, no tenderness/mass/nodules Lymph nodes: Cervical, supraclavicular, and axillary nodes normal. Resp: wheezes bilaterally Back: symmetric, no curvature. ROM normal. No CVA tenderness. Cardio: regular  rate and rhythm, S1, S2 normal, no murmur, click, rub or gallop GI: soft, non-tender; bowel sounds normal; no masses,  no organomegaly Extremities: extremities normal, atraumatic, no cyanosis or edema  ECOG PERFORMANCE STATUS: 1 - Symptomatic but completely ambulatory  Blood pressure (!) 120/56, pulse 71, temperature 98.5 F (36.9 C), temperature source Oral, resp. rate 18, height 5' 5.5" (1.664 m), weight 170 lb 6.4 oz (77.3 kg), SpO2 100 %.  LABORATORY DATA: Lab Results  Component Value Date   WBC 8.5 05/19/2017   HGB 12.4 05/19/2017   HCT 36.9 05/19/2017   MCV 89.3 05/19/2017   PLT 221 05/19/2017      Chemistry      Component Value Date/Time   NA 137 05/05/2017 1113   NA 141 04/07/2017 1016   K 3.9 05/05/2017 1113   K 3.4 (L) 04/07/2017 1016   CL 101 05/05/2017 1113   CL 99 07/21/2012 1125   CO2 27 05/05/2017 1113   CO2 27 04/07/2017 1016   BUN 15 05/05/2017 1113   BUN 13.8 04/07/2017 1016   CREATININE 1.22 (H) 05/05/2017 1113   CREATININE 1.1 04/07/2017 1016      Component Value Date/Time   CALCIUM 9.3 05/05/2017 1113   CALCIUM 9.5 04/07/2017 1016   ALKPHOS 93 05/05/2017 1113   ALKPHOS 94 04/07/2017 1016   AST 14 05/05/2017 1113   AST 13 04/07/2017 1016   ALT 8 05/05/2017 1113   ALT 8 04/07/2017 1016   BILITOT <0.2 (L) 05/05/2017 1113   BILITOT 0.36 04/07/2017 1016       RADIOGRAPHIC STUDIES: No results found.  ASSESSMENT AND PLAN:  This is a very pleasant 72 years old white female with recurrent non-small cell lung cancer, adenocarcinoma that was initially diagnosed as unresectable stage IIA in December 2012 status post systemic chemotherapy as well as immunotherapy.  She is currently undergoing treatment with  immunotherapy with Nivolumab 240 mg IV every 2 weeks.  She is status post 6 cycles of the new regimen. The patient continues to tolerate this treatment well with no concerning complaints. I recommended for her to proceed with cycle #7 today as a schedule. I will see her back for follow-up visit in 2 weeks for evaluation before starting cycle #8. She was advised to call immediately if she has any concerning symptoms in the interval. The patient voices understanding of current disease status and treatment options and is in agreement with the current care plan. All questions were answered. The patient knows to call the clinic with any problems, questions or concerns. We can certainly see the patient much sooner if necessary.  Disclaimer: This note was dictated with voice recognition software. Similar sounding words can inadvertently be transcribed and may not be corrected upon review.

## 2017-05-19 NOTE — Patient Instructions (Signed)
[facility] Discharge Instructions for Patients Receiving Chemotherapy  Today you received the following chemotherapy agents: Nivolumab  To help prevent nausea and vomiting after your treatment, we encourage you to take your nausea medication as directed.    If you develop nausea and vomiting that is not controlled by your nausea medication, call the clinic.   BELOW ARE SYMPTOMS THAT SHOULD BE REPORTED IMMEDIATELY:  *FEVER GREATER THAN 100.5 F  *CHILLS WITH OR WITHOUT FEVER  NAUSEA AND VOMITING THAT IS NOT CONTROLLED WITH YOUR NAUSEA MEDICATION  *UNUSUAL SHORTNESS OF BREATH  *UNUSUAL BRUISING OR BLEEDING  TENDERNESS IN MOUTH AND THROAT WITH OR WITHOUT PRESENCE OF ULCERS  *URINARY PROBLEMS  *BOWEL PROBLEMS  UNUSUAL RASH Items with * indicate a potential emergency and should be followed up as soon as possible.  Feel free to call the clinic should you have any questions or concerns. The clinic phone number is [phone].  Please show the CHEMO ALERT CARD at check-in to the Emergency Department and triage nurse.   

## 2017-05-19 NOTE — Telephone Encounter (Signed)
Gave avs and calendar for march - april

## 2017-05-20 ENCOUNTER — Other Ambulatory Visit: Payer: Self-pay | Admitting: Internal Medicine

## 2017-05-20 NOTE — Telephone Encounter (Signed)
Pt is calling back [phone]

## 2017-05-20 NOTE — Telephone Encounter (Signed)
Spoke with pt, advised RB will have to address on Monday.   HYDROcodone-acetaminophen (NORCO) 10-325 MG tablet [[id number]]  Order Details  Dose: 1 tablet Route: Oral Frequency: Every 6 hours PRN  Dispense Quantity: 30 tablet Refills: 0 Fills remaining: --        Sig: Take 1 tablet by mouth every 6 (six) hours as needed.       Written Date: 05/05/17 Expiration Date: 11/01/17    Start Date: 05/13/17 End Date: --    Earliest Fill Date: 05/13/17          Ordering Provider:  Collene Gobble, MD DEA #:  [id number] NPI:  [id number]

## 2017-05-23 MED ORDER — HYDROCODONE-ACETAMINOPHEN 10-325 MG PO TABS: 1.0000 | ORAL_TABLET | Freq: Four times a day (QID) | ORAL | 0 refills | Status: DC | PRN

## 2017-05-23 NOTE — Telephone Encounter (Signed)
Called and spoke with Amber from pharmacy, she advised that patient did pick up previous prescription of quantity 30. Will send in script for 30 more pills. Nothing further needed.

## 2017-05-23 NOTE — Telephone Encounter (Signed)
RB please advise- 05/13/17 rx for Hydrocodone 10/325 was written for #30 with 0 refills, pt states it was supposed to be for #60.  Thanks.

## 2017-05-23 NOTE — Telephone Encounter (Signed)
If #60 was her stable dose, then OK to change. Please confirm that she was getting #60 on her prior scripts, if so then [facility] to give the extra 30 to last the month.

## 2017-05-23 NOTE — Telephone Encounter (Signed)
Called pt letting her know we had her hydrocodone-acetaminophen Rx signed and ready to be picked up.  Pt stated to me to place it up front and she would have her son come by and pick it up for pt.  Rx being placed up front.  Nothing further needed at this current time.

## 2017-05-23 NOTE — Telephone Encounter (Signed)
I can sign prescription. 

## 2017-05-23 NOTE — Telephone Encounter (Signed)
Vs, please advise would you be ok with signing this prescription in lieu of RB

## 2017-05-30 ENCOUNTER — Telehealth: Payer: Self-pay | Admitting: Emergency Medicine

## 2017-05-30 NOTE — Telephone Encounter (Signed)
Routing to [place] to follow up on.

## 2017-05-30 NOTE — Telephone Encounter (Signed)
Form will be addressed once RB's returns to the office on 06/01/17.

## 2017-05-31 ENCOUNTER — Telehealth: Payer: Self-pay | Admitting: *Deleted

## 2017-05-31 ENCOUNTER — Telehealth: Payer: Self-pay | Admitting: Emergency Medicine

## 2017-05-31 NOTE — Telephone Encounter (Signed)
Pt called states she is not feeling well, she is not coming to her appts this week and will follow back up 3/7 as scheduled.  Message forward to MD and scheduling.

## 2017-05-31 NOTE — Telephone Encounter (Signed)
Patient is wanting to know if she can take her ventolin instead of pro air, RB please advise if this is ok.

## 2017-06-01 MED ORDER — PREDNISONE 10 MG PO TABS: ORAL_TABLET | ORAL | 0 refills | Status: DC

## 2017-06-01 MED ORDER — AZITHROMYCIN 250 MG PO TABS: ORAL_TABLET | ORAL | 0 refills | Status: DC

## 2017-06-01 MED ORDER — ALBUTEROL SULFATE HFA 108 (90 BASE) MCG/ACT IN AERS: 2.0000 | INHALATION_SPRAY | Freq: Four times a day (QID) | RESPIRATORY_TRACT | 6 refills | Status: AC | PRN

## 2017-06-01 NOTE — Telephone Encounter (Signed)
Pt requesting an abx.  C/o worsening sob, fatigue, body aches, prod cough with yellow/green mucus, sinus congestion X1 week. Taking Claritin to help with s/s, requesting additional recs.  Notes that she cancelled her chemo appt tomorrow, is requesting prednisone.  Pt uses [facility] on [address].    RB please advise.  Thanks.

## 2017-06-01 NOTE — Telephone Encounter (Signed)
Pt is calling back. Per Pt she is sick and has switched from Proair to Ventolin. Needs a refill on Ventolin. Would also like to know if she can get ABX called in. Complaints of cough and fever. Pharm [facility] [address] [facility] [address].

## 2017-06-01 NOTE — Telephone Encounter (Signed)
Pt aware of recs.  rx's sent to preferred pharmacy.  Pt did not wish to schedule an appt at this time, states she will call back if one is needed.  Will close encounter.

## 2017-06-01 NOTE — Telephone Encounter (Signed)
Okay to call in azithromycin, Z-Pak.  Also prednisone Take 40mg  daily for 3 days, then 30mg  daily for 3 days, then 20mg  daily for 3 days, then 10mg  daily for 3 days, then stop.  She likely also needs to be seen if she is willing to set up an office visit this week.

## 2017-06-02 ENCOUNTER — Ambulatory Visit: Payer: Medicare Other

## 2017-06-02 ENCOUNTER — Ambulatory Visit: Payer: Medicare Other | Admitting: Oncology

## 2017-06-02 ENCOUNTER — Telehealth: Payer: Self-pay

## 2017-06-02 ENCOUNTER — Other Ambulatory Visit: Payer: Medicare Other

## 2017-06-02 NOTE — Telephone Encounter (Signed)
Form was given to RB. Will update chart once form is returned.

## 2017-06-02 NOTE — Telephone Encounter (Signed)
Spoke with patient concerning appt. Have already been canceled, also mailed calender of upcoming appts. Per 2/21 return phone messages.

## 2017-06-06 NOTE — Telephone Encounter (Signed)
Form was returned to me. This was placed in the outgoing mail per the pt's request. Nothing further was needed.

## 2017-06-08 ENCOUNTER — Telehealth: Payer: Self-pay | Admitting: Emergency Medicine

## 2017-06-08 NOTE — Telephone Encounter (Signed)
Spoke with patient. She is aware of RB's recs. She has been scheduled with RB for tomorrow at 430.   Nothing else needed at time of call.

## 2017-06-08 NOTE — Telephone Encounter (Signed)
Spoke with patient. She stated that RB gave her an zpak on 06/01/17 and it seems to help her with her productive cough. However, she has completed the abx and the cough is back. She wants to know if she could have another 6 days of the zpak. Productive cough with clear mucus.   Advised patient that since she had multiple requests for abx, it might be best for her to come in for an appointment to see what is going on. Patient refused. She just wants another 6 days of abx to help with the cough.   She wishes to use [facility] [address].   RB, please advise. Thanks!

## 2017-06-08 NOTE — Telephone Encounter (Signed)
Sorry, but she needs to be evaluated. This is clearly not resolving with abx over the phone.

## 2017-06-09 ENCOUNTER — Ambulatory Visit (INDEPENDENT_AMBULATORY_CARE_PROVIDER_SITE_OTHER): Payer: Medicare Other | Admitting: Emergency Medicine

## 2017-06-09 ENCOUNTER — Encounter: Payer: Self-pay | Admitting: Emergency Medicine

## 2017-06-09 ENCOUNTER — Ambulatory Visit (INDEPENDENT_AMBULATORY_CARE_PROVIDER_SITE_OTHER)
Admission: RE | Admit: 2017-06-09 | Discharge: 2017-06-09 | Disposition: A | Discharge status: Home / Self Care | Payer: Medicare Other | Source: Ambulatory Visit | Attending: Emergency Medicine | Admitting: Emergency Medicine

## 2017-06-09 VITALS — BP 162/68 | HR 107 | Wt 171.0 lb

## 2017-06-09 DIAGNOSIS — J449 Chronic obstructive pulmonary disease, unspecified: Secondary | ICD-10-CM | POA: Diagnosis not present

## 2017-06-09 DIAGNOSIS — R059 Cough, unspecified: Secondary | ICD-10-CM

## 2017-06-09 DIAGNOSIS — R05 Cough: Secondary | ICD-10-CM

## 2017-06-09 DIAGNOSIS — R0602 Shortness of breath: Secondary | ICD-10-CM | POA: Diagnosis not present

## 2017-06-09 MED ORDER — FUROSEMIDE 40 MG PO TABS: 40.0000 mg | ORAL_TABLET | Freq: Every day | ORAL | 0 refills | Status: DC

## 2017-06-09 MED ORDER — LEVOFLOXACIN 500 MG PO TABS: 500.0000 mg | ORAL_TABLET | Freq: Every day | ORAL | 0 refills | Status: DC

## 2017-06-09 NOTE — Progress Notes (Signed)
Subjective:    Patient ID: [name], female    DOB: 05-01-1945, 72 y.o.   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 Most recent CT scan of the chest 11/11/16 that I have reviewed. This shows large scale stability but a slow progressive enlargement of a masslike predominantly groundglass attenuation and septal thickening of the right middle lobe. She remains on immunotherapy. She reports a couple of episodes of being awakened with acute dyspnea. She is having more cough for the last 2 weeks. Productive of green. She is having from L back pain. She has wheeze through the day. She has been followed by Dr Burt Knack with Cardiology, was to have a stress test but she did not attend.   ROV 05/05/17 --[name] is 71 and has a history of severe COPD and chronic hypoxemic respiratory failure, stage III a adenocarcinoma of the right upper lobe followed by [name].  She is on the nivolumab immunotherapy.  She was seen here 04/13/17 for an acute visit and treated for suspected COPD flare and bronchitis.  She received Augmentin. Had some improvement in congestion. She was then treated by oncology with bactrim. Currently she is having less chest congestion. Has head congestion on flonase, loratadine (or allegra). She does not feel that the Ventolin works for her, wants an alternative.   She is having a lot of back and joint pain, bony pain. She is also dealing w gallstones.     Acute visit 06/09/17 --this is an acute visit for [name].  She has a history of severe COPD with associated chronic hypoxemic respiratory failure.  She also has stage III a adenocarcinoma of the right upper lobe.  She has been on and off nivolumab immunotherapy, currently on it with her last dose given 2/21.  I treated her 2/19 with azithromycin and a prednisone taper because she was experiencing  dyspnea, cough and "getting strangled on fluid."On further questioning I think she is referring to thick clear secretions.  She has orthopnea. She is finishing the prednisone now. Her breathing and cough were better on abx and higher dose pred. Deniers any wt gain. She has some LE and hand edema. She is still on allegra / claritin and flonase.       Objective:   Physical Exam Vitals:   06/09/17 1629  BP: (!) 162/68  Pulse: (!) 107  SpO2: 99%  Weight: 171 lb (77.6 kg)   Gen: Pleasant, elderly woman, in no distress,  normal affect  ENT: No lesions,  mouth clear,  oropharynx clear, no postnasal drip  Neck: No JVD, no TMG, no carotid bruits  Lungs: No use of accessory muscles, distant, no wheeze  Cardiovascular: regular, 3/6 systolic M with an intact S2.    Musculoskeletal: No deformities, no cyanosis or clubbing.   Neuro: alert, non focal  Skin: Warm, no lesions or rashes   CBC Latest Ref Rng & Units 05/19/2017 05/05/2017 04/07/2017  WBC 3.9 - 10.3 K/uL 8.5 7.9 8.8  Hemoglobin 11.6 - 15.9 g/dL 12.4 11.6 12.7  Hematocrit 34.8 - 46.6 % 36.9 35.7 39.0  Platelets 145 - 400 K/uL 221 209 203   BMET    Component Value Date/Time   NA 139 05/19/2017 1015   NA 141 04/07/2017 1016   K 3.7 05/19/2017 1015   K 3.4 (L) 04/07/2017 1016   CL  102 05/19/2017 1015   CL 99 07/21/2012 1125   CO2 28 05/19/2017 1015   CO2 27 04/07/2017 1016   GLUCOSE 96 05/19/2017 1015   GLUCOSE 93 04/07/2017 1016   GLUCOSE 184 (H) 07/21/2012 1125   BUN 15 05/19/2017 1015   BUN 13.8 04/07/2017 1016   CREATININE 0.88 05/19/2017 1015   CREATININE 1.1 04/07/2017 1016   CALCIUM 9.6 05/19/2017 1015   CALCIUM 9.5 04/07/2017 1016   GFRNONAA >60 05/19/2017 1015   GFRAA >60 05/19/2017 1015   CT chest 03/24/17 --  COMPARISON:  CT chest 11/10/2016, 08/31/2016  FINDINGS: Cardiovascular: Coronary artery calcification and aortic atherosclerotic calcification.  Mediastinum/Nodes: No axillary or  supraclavicular adenopathy. No mediastinal hilar adenopathy. No pericardial fluid. Small amount of fluid esophagus.  Lungs/Pleura: Geographic region of ground-glass opacity in the RIGHT upper lobe is not changed appreciably in the interval (CT 11/10/2016, 08/31/2016). Region measuring approximately 6.8 by 5.0 cm compared with 7.0 by 4.7 cm.  Along the medial border of this ground-glass opacity, rounded nodular lesion measuring 15 mm by 14 mm increased in size from 11 mm by 8 mm. This lesion is also seen to be larger on coronal imaging (image 78, series 6). This lesion is immediately adjacent to a bronchus.  Band of atelectasis extends inferiorly from the marginal lesion to the RIGHT hilum also unchanged.  Small to moderate RIGHT pleural effusion is stable.  A band of ground-glass opacity in the superior aspect of the LEFT lower lobe is unchanged (image 53, series 2). Smaller ground-glass focus in the anterior LEFT lower lobe (image 63, series 2) unchanged. Patient status post LEFT upper lobectomy.  Upper Abdomen: Bilateral adrenal adenomas.  Cholelithiasis  Musculoskeletal: No aggressive osseous lesion.  IMPRESSION: 1. Along the medial border of the geographic region of ground-glass opacity in the RIGHT upper lobe , interval increase in rounded nodule adjacent to the bronchus. Findings are concerning for local lung cancer recurrence. Recommend FDG PET scan and potential bronchoscopy for evaluation. 2. Stable  perihilar bandlike atelectasis in the RIGHT lung. 3. Stable RIGHT pleural effusion. 4. Stable ground-glass opacities in LEFT lung     Assessment & Plan:  COPD (chronic obstructive pulmonary disease) (HCC) Continue Trelegy as ordered, albuterol as needed.  SOB (shortness of breath) Shortness of breath, dyspnea, tenacious secretions and difficulty managing secretions.  She is most bothered when she is laying down, she sleeps propped up.  She is a difficult  history giver. She felt that she improved some when she was on recent prednisone taper and antibiotics but she is immediately symptomatic again.   Unclear to me whether this reflects uncontrolled chronic rhinitis, persistent secretion from her COPD with superimposed contribution of immunotherapy, or simply her adenocarcinoma which can cause clear secretions.  She did have persistent symptoms in the past on immunotherapy and this was held temporarily.  She insisted on restarting back in June 2018 in the setting of some progression of her groundglass infiltrate, likely adenocarcinoma.  I will check a chest x-ray today to look for progression of disease, pulmonary edema, infiltrate.  I will treat her empirically with levofloxacin since she did improve recently when she was on antibiotics.  Empirically with short-term Lasix for any possible component of volume overload.  I want to defer prednisone for now in absence of active wheezing.  I will try to better address her allergic rhinitis in case this is a contributor to her chest congestion.  I did broach the subject with her today of  whether these findings were related to her underlying cancer or her immunotherapy or both.  She did have to stop immunotherapy for period of time as detailed above.  Please take levaquin 500mg  daily for 5 days Take lasix 40mg  daily for 3 days and then stop.  Continue your allegra (or claritin) daily. Continue flonase nasal spray, 2 sprays each side once a day You might benefit from starting nasal saline rinses once a day CXR today Continue your Trelegy once a day Take albuterol 2 puffs up to every 4 hours if needed for shortness of breath.  Follow with Dr Lamonte Sakai in 1 month or next available.   Baltazar Apo, MD, PhD 06/09/2017, 5:06 PM [facility] [phone] or if no answer [phone]

## 2017-06-09 NOTE — Patient Instructions (Addendum)
Please take levaquin 500mg  daily for 5 days Take lasix 40mg  daily for 3 days and then stop.  Continue your allegra (or claritin) daily. Continue flonase nasal spray, 2 sprays each side once a day You might benefit from starting nasal saline rinses once a day CXR today Continue your Trelegy once a day Take albuterol 2 puffs up to every 4 hours if needed for shortness of breath.  Follow with Dr Lamonte Sakai in 1 month or next available.

## 2017-06-09 NOTE — Assessment & Plan Note (Signed)
Shortness of breath, dyspnea, tenacious secretions and difficulty managing secretions.  She is most bothered when she is laying down, she sleeps propped up.  She is a difficult history giver. She felt that she improved some when she was on recent prednisone taper and antibiotics but she is immediately symptomatic again.   Unclear to me whether this reflects uncontrolled chronic rhinitis, persistent secretion from her COPD with superimposed contribution of immunotherapy, or simply her adenocarcinoma which can cause clear secretions.  She did have persistent symptoms in the past on immunotherapy and this was held temporarily.  She insisted on restarting back in June 2018 in the setting of some progression of her groundglass infiltrate, likely adenocarcinoma.  I will check a chest x-ray today to look for progression of disease, pulmonary edema, infiltrate.  I will treat her empirically with levofloxacin since she did improve recently when she was on antibiotics.  Empirically with short-term Lasix for any possible component of volume overload.  I want to defer prednisone for now in absence of active wheezing.  I will try to better address her allergic rhinitis in case this is a contributor to her chest congestion.  I did broach the subject with her today of whether these findings were related to her underlying cancer or her immunotherapy or both.  She did have to stop immunotherapy for period of time as detailed above.  Please take levaquin 500mg  daily for 5 days Take lasix 40mg  daily for 3 days and then stop.  Continue your allegra (or claritin) daily. Continue flonase nasal spray, 2 sprays each side once a day You might benefit from starting nasal saline rinses once a day CXR today Continue your Trelegy once a day Take albuterol 2 puffs up to every 4 hours if needed for shortness of breath.  Follow with Dr Lamonte Sakai in 1 month or next available.

## 2017-06-09 NOTE — Assessment & Plan Note (Signed)
Continue Trelegy as ordered, albuterol as needed.

## 2017-06-10 ENCOUNTER — Telehealth: Payer: Self-pay | Admitting: Emergency Medicine

## 2017-06-10 NOTE — Telephone Encounter (Signed)
Called pt who stated she is currently taking levaquin per RB and she states she feels like there is a lot of fluid in her chest.  When pt took the levaquin and used the flonase, pt stated if felt like she was choking along with having a lot of fluid in her head.  Pt states she might have chronic sinusitis along with her COPD.  Routing this to [name] as an Juluis Rainier

## 2017-06-13 ENCOUNTER — Telehealth: Payer: Self-pay | Admitting: Internal Medicine

## 2017-06-13 MED ORDER — ALPRAZOLAM 1 MG PO TABS: 1.0000 mg | ORAL_TABLET | Freq: Three times a day (TID) | ORAL | 2 refills | Status: DC | PRN

## 2017-06-13 NOTE — Telephone Encounter (Signed)
Copied from [place] [id number]. Topic: Quick Communication - Rx Refill/Question >> Jun 13, 2017  2:04 PM Scherrie Gerlach wrote: Medication: ALPRAZolam Duanne Moron) 1 MG tablet  Pt states her nerves are worse lately and she would like Dr Jenny Reichmann to increase this med back to 3 a day. Pt states the 2 a day is just not working for her. Pt has appt 3/25 for a follow up. [facility] [place], [place] [phone] (Phone) [phone] (Fax)

## 2017-06-13 NOTE — Telephone Encounter (Signed)
Done erx 

## 2017-06-14 ENCOUNTER — Telehealth: Payer: Self-pay | Admitting: Emergency Medicine

## 2017-06-14 MED ORDER — PREDNISONE 20 MG PO TABS: 20.0000 mg | ORAL_TABLET | Freq: Every day | ORAL | 0 refills | Status: DC

## 2017-06-14 NOTE — Telephone Encounter (Signed)
Called and spoke with patient she states that she is still having congestion. Sometimes it is white and sometimes it is yellow. Patient states she is still using inhalers and her allegra. She finished her antibiotics. She took some honey and said it made it worse. RB please advise on this, thanks.

## 2017-06-14 NOTE — Telephone Encounter (Signed)
Called pt and advised message from the provider. Pt understood and verbalized understanding. Nothing further is needed.   Rx sent into pharmacy.

## 2017-06-14 NOTE — Telephone Encounter (Signed)
The only thing to add would be prednisone. If she would like to do this then have her start prednisone 20mg  for 7 days, then decrease to 10mg  daily and stay on that dose.

## 2017-06-16 ENCOUNTER — Inpatient Hospital Stay: Payer: Medicare Other

## 2017-06-16 ENCOUNTER — Inpatient Hospital Stay: Payer: Medicare Other | Attending: Medical | Admitting: Internal Medicine

## 2017-06-16 ENCOUNTER — Encounter: Payer: Self-pay | Admitting: Internal Medicine

## 2017-06-16 VITALS — BP 162/58 | HR 103 | Temp 99.7°F | Resp 18 | Ht 65.5 in | Wt 172.9 lb

## 2017-06-16 DIAGNOSIS — Z5112 Encounter for antineoplastic immunotherapy: Secondary | ICD-10-CM

## 2017-06-16 DIAGNOSIS — C3411 Malignant neoplasm of upper lobe, right bronchus or lung: Secondary | ICD-10-CM | POA: Diagnosis not present

## 2017-06-16 DIAGNOSIS — R0602 Shortness of breath: Secondary | ICD-10-CM | POA: Diagnosis not present

## 2017-06-16 DIAGNOSIS — R05 Cough: Secondary | ICD-10-CM | POA: Diagnosis not present

## 2017-06-16 DIAGNOSIS — C3490 Malignant neoplasm of unspecified part of unspecified bronchus or lung: Secondary | ICD-10-CM

## 2017-06-16 DIAGNOSIS — I10 Essential (primary) hypertension: Secondary | ICD-10-CM

## 2017-06-16 DIAGNOSIS — Z79899 Other long term (current) drug therapy: Secondary | ICD-10-CM | POA: Diagnosis not present

## 2017-06-16 DIAGNOSIS — R042 Hemoptysis: Secondary | ICD-10-CM | POA: Insufficient documentation

## 2017-06-16 DIAGNOSIS — Z992 Dependence on renal dialysis: Secondary | ICD-10-CM | POA: Diagnosis not present

## 2017-06-16 DIAGNOSIS — R509 Fever, unspecified: Secondary | ICD-10-CM | POA: Insufficient documentation

## 2017-06-16 DIAGNOSIS — J439 Emphysema, unspecified: Secondary | ICD-10-CM | POA: Diagnosis not present

## 2017-06-16 DIAGNOSIS — R531 Weakness: Secondary | ICD-10-CM

## 2017-06-16 LAB — CBC WITH DIFFERENTIAL/PLATELET
BASOS ABS: 0 10*3/uL (ref 0.0–0.1)
BASOS PCT: 0 %
Eosinophils Absolute: 0.6 10*3/uL — ABNORMAL HIGH (ref 0.0–0.5)
Eosinophils Relative: 5 %
HEMATOCRIT: 39.9 % (ref 34.8–46.6)
Hemoglobin: 12.9 g/dL (ref 11.6–15.9)
LYMPHS PCT: 14 %
Lymphs Abs: 1.9 10*3/uL (ref 0.9–3.3)
MCH: 29.8 pg (ref 25.1–34.0)
MCHC: 32.3 g/dL (ref 31.5–36.0)
MCV: 92.1 fL (ref 79.5–101.0)
Monocytes Absolute: 0.9 10*3/uL (ref 0.1–0.9)
Monocytes Relative: 7 %
NEUTROS ABS: 9.6 10*3/uL — AB (ref 1.5–6.5)
Neutrophils Relative %: 74 %
PLATELETS: 189 10*3/uL (ref 145–400)
RBC: 4.33 MIL/uL (ref 3.70–5.45)
RDW: 13.7 % (ref 11.2–14.5)
WBC: 13 10*3/uL — ABNORMAL HIGH (ref 3.9–10.3)

## 2017-06-16 LAB — COMPREHENSIVE METABOLIC PANEL
ALK PHOS: 101 U/L (ref 40–150)
ALT: 18 U/L (ref 0–55)
ANION GAP: 10 (ref 3–11)
AST: 20 U/L (ref 5–34)
Albumin: 3.4 g/dL — ABNORMAL LOW (ref 3.5–5.0)
BILIRUBIN TOTAL: 0.7 mg/dL (ref 0.2–1.2)
BUN: 16 mg/dL (ref 7–26)
CALCIUM: 9.7 mg/dL (ref 8.4–10.4)
CO2: 29 mmol/L (ref 22–29)
Chloride: 98 mmol/L (ref 98–109)
Creatinine, Ser: 0.89 mg/dL (ref 0.60–1.10)
GFR calc Af Amer: >60 mL/min (ref >60)
GFR calc non Af Amer: >60 mL/min (ref >60)
Glucose, Bld: 150 mg/dL — ABNORMAL HIGH (ref 70–140)
POTASSIUM: 3.2 mmol/L — AB (ref 3.5–5.1)
Sodium: 137 mmol/L (ref 136–145)
TOTAL PROTEIN: 6.3 g/dL — AB (ref 6.4–8.3)

## 2017-06-16 LAB — TSH: TSH: 1.392 u[IU]/mL (ref 0.308–3.960)

## 2017-06-16 MED ORDER — FOLIC ACID 1 MG PO TABS: 1.0000 mg | ORAL_TABLET | Freq: Every day | ORAL | 4 refills | Status: DC

## 2017-06-16 NOTE — Progress Notes (Signed)
[facility] [phone]   [phone]  OFFICE PROGRESS NOTE  [name], MD [place] [address]  DIAGNOSIS: Recurrent non-small cell lung cancer initially diagnosed as a stage IIA adenocarcinoma in December 2012 with no actionable mutations.   PRIOR THERAPY: 1) Status post radiation therapy to the right upper lobe lung mass completed on 06/04/2011 under the care of Dr. Pablo Ledger.  2) Systemic chemotherapy with carboplatin for AUC of 5 and Alimta 500 mg/M2 giving every 3 weeks, status post 3 cycles, last dose was given on 07/21/2011.  3) Systemic chemotherapy with carboplatin for AUC of 5 and Alimta 500 MG/M2 every 3 weeks. First dose on 01/02/2015. Status post 2 cycles, last dose was given 01/30/2015. Her treatment is currently on hold secondary to intolerance. 4) Immunotherapy with Nivolumab 240 M IV every 2 weeks status post 14 cycles.  5)immunotherapy with Nivolumab 480 mg IV every 4 weeks. Patient is status post 1 cycle. The patient discontinued treatment with Nivolumab every 4 weeks because she want to switch back to every 2 weeks due to increased side effects with the monthly dosing.  CURRENT THERAPY: The patient will resume her treatment again with immunotherapy with Nivolumab 240 mg IV every 2 weeks. First dose 09/15/2016. Status post 7 cycles  INTERVAL HISTORY: [name] 72 y.o. female returns to the clinic today for follow-up visit accompanied by her son and daughter.  The patient is not feeling well these days and has been complaining of increasing fatigue and weakness as well as cough and shortness of breath.  She is currently on home oxygen.  She denied having any chest pain or hemoptysis.  She has low-grade fever today.  She was treated with 3 courses of antibiotics recently in addition to steroids under the care of [name].  She was also expected to start a Medrol Dosepak prescribed by [name] but she  was waiting for the visit today.  She has no recent weight loss or night sweats.  She has no nausea, vomiting, diarrhea or constipation.  She is here today for evaluation before starting her next cycle of treatment with immunotherapy.  MEDICAL HISTORY: Past Medical History:  Diagnosis Date  . Adenocarcinoma, lung ([place])    upper left lobe  . Anemia in neoplastic disease   . Anxiety   . Arthritis    "all over my body"  . Asthma   . Asymptomatic varicose veins   . Atherosclerosis of native arteries of the extremities, unspecified   . Atrial fibrillation ([place])   . Bronchial pneumonia    history  . 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 Chemotherapy adverse reaction 09/15/11   "makes me light headed and pass out; this is 3rd blood transfusion since going thru it"  . CHF (congestive heart failure) ([facility])   . Chronic airway obstruction, not elsewhere classified   . Chronic fatigue 08/19/2015  . Chronic sinusitis   . Complication of anesthesia    slow to wake up  . 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 Coronary artery disease    Dr. Burt Knack had stress test done  . DDD (degenerative disc disease), lumbar   . Depressive disorder, not elsewhere classified   . Diseases of lips   . Disorder of bone and cartilage, unspecified   . DVT (deep venous thrombosis) ([place])   . Fibromyalgia   . Functional urinary incontinence   .  GERD (gastroesophageal reflux disease)   . Hard of hearing, right   . Herpes zoster without mention of complication   . Hip pain, chronic 09/17/2014  . History of blood clots    "2 in my aorta"  . History of blood transfusion   . History of bronchitis   . History of echocardiogram    Echo 11/18: mild LVH, EF 60-65, no RWMA, Gr 1 DD, Ao Sclerosis, mild AI, RVH with low nl RSVF, trivial eff  . Hx of radiation therapy 04/28/11 -06/08/11   right upper lobe-lung  . Hyperlipidemia   . Hypertension   . Kidney infection    "related to chemo"  . Lumbago   . Malignant neoplasm of  bronchus and lung, unspecified site   . Mental disorder   . Muscle weakness (generalized)   . Non-small cell lung cancer ([facility]) 03/31/2011   Adenocarcinoma Rt upper lobe mass  . Osteoarthrosis, unspecified whether generalized or localized, unspecified site   . Osteoporosis   . Other abnormal blood chemistry   . Other and unspecified hyperlipidemia   . PAD (peripheral artery disease) ([place])   . Pain in thoracic spine   . 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 Primary cancer of right upper lobe of lung ([facility]) 02/08/2011   Biopsy Rt upper lobe mass 03/31/11>>adenocarcinoma DIAGNOSIS: Stage IIB/IIIA non-small cell lung cancer consistent with adenocarcinoma diagnosed in December 2012.   PRIOR THERAPY:  1) Status post radiation therapy to the right upper lobe lung mass completed on 06/04/2011 under the care of Dr. Pablo Ledger.  2) Systemic chemotherapy with carboplatin for AUC of 5 and Alimta 500 mg/M2 giving every 3   . Reflux   . Reflux esophagitis   . Respiratory failure with hypoxia 01/02/2010  . Restless legs syndrome (RLS)   . Scoliosis   . Screening for thyroid disorder   . Shortness of breath    "anytime really"  . Sinus headache    "all the time"  . Spasm of muscle   . Syncope and collapse   . Type II or unspecified type diabetes mellitus with neurological manifestations, uncontrolled(250.62)   . Type II or unspecified type diabetes mellitus without mention of complication, not stated as uncontrolled   . Type II or unspecified type diabetes mellitus without mention of complication, not stated as uncontrolled   . Unspecified constipation   . Unspecified essential hypertension   . Unspecified hearing loss   . Unspecified hereditary and idiopathic peripheral neuropathy   . Unspecified vitamin D deficiency     ALLERGIES:  is allergic to iohexol; other; anoro ellipta [umeclidinium-vilanterol]; codeine; and protonix [pantoprazole sodium].  MEDICATIONS:  Current Outpatient Medications    Medication Sig Dispense Refill  . ACCU-CHEK SOFTCLIX LANCETS lancets by Other route. Check blood sugar twice daily. Dx: E11.9, E11.49    . albuterol (PROVENTIL HFA;VENTOLIN HFA) 108 (90 Base) MCG/ACT inhaler Inhale 2 puffs into the lungs every 6 (six) hours as needed for wheezing or shortness of breath. 1 Inhaler 6  . albuterol (PROVENTIL) (2.5 MG/3ML) 0.083% nebulizer solution Take 3 mLs (2.5 mg total) by nebulization every 6 (six) hours as needed for wheezing or shortness of breath. 75 mL 5  . ALPRAZolam (XANAX) 1 MG tablet Take 1 tablet (1 mg total) by mouth 3 (three) times daily as needed. for anxiety 90 tablet 2  . aspirin 81 MG tablet Take 81 mg by mouth daily.    Marland Kitchen azithromycin (ZITHROMAX Z-PAK) 250 MG tablet Take 2 tabs today, then  1 daily until gone. 6 each 0  . benzonatate (TESSALON) 100 MG capsule Take 1 capsule (100 mg total) by mouth 3 (three) times daily as needed for cough. 40 capsule 1  . Blood Glucose Monitoring Suppl (ACCU-CHEK AVIVA PLUS) w/Device KIT Use as directed to check blood sugar Dx: E11.9, E11.49 1 kit 0  . Calcium Carbonate-Vit D-Min (CALTRATE PLUS PO) Take 1 tablet by mouth daily.    Marland Kitchen DIPHENHIST 25 MG capsule TAKE 1 CAPSULE BY MOUTH 2 HOURS PRIOR TO CT SCAN 10 capsule 0  . fexofenadine (ALLEGRA) 180 MG tablet Take 180 mg by mouth daily.    . fluticasone (FLONASE) 50 MCG/ACT nasal spray SHAKE LIQUID WELL AND USE 2 SPRAYS IN EACH NOSTRIL TWICE DAILY 16 g 5  . Fluticasone-Umeclidin-Vilant (TRELEGY ELLIPTA) 100-62.5-25 MCG/INH AEPB Inhale 1 puff daily into the lungs. 1 each 6  . furosemide (LASIX) 40 MG tablet Take 1 tablet (40 mg total) by mouth daily for 3 doses. 3 tablet 0  . HYDROcodone-acetaminophen (NORCO) 10-325 MG tablet Take 1 tablet by mouth every 6 (six) hours as needed. 30 tablet 0  . levofloxacin (LEVAQUIN) 500 MG tablet Take 1 tablet (500 mg total) by mouth daily. 5 tablet 0  . loratadine (CLARITIN) 10 MG tablet TAKE 1 TABLET BY MOUTH EVERY DAY 30 tablet 5   . metFORMIN (GLUCOPHAGE) 500 MG tablet Take 1 tablet (500 mg total) daily as needed by mouth (high blood sugar- 160+). Take one tablet at night for blood sugar 90 tablet 3  . OXYGEN Inhale 4 L into the lungs.    . potassium chloride (K-DUR,KLOR-CON) 10 MEQ tablet Take 1 tablet (10 mEq total) daily by mouth. 90 tablet 3  . predniSONE (DELTASONE) 10 MG tablet 32mX3 days, 372mX3 days, 2057m3 days, 81m55mdays, then stop. 30 tablet 0  . predniSONE (DELTASONE) 20 MG tablet Take 1 tablet (20 mg total) by mouth daily with breakfast. 7 tablet 0  . ranitidine (ZANTAC) 150 MG tablet Take 1 tablet (150 mg total) by mouth 2 (two) times daily. 180 tablet 3  . senna (SENOKOT) 8.6 MG tablet Take 1 tablet by mouth daily.    . traMADol (ULTRAM) 50 MG tablet Take 2 tablets (100 mg total) every 6 (six) hours as needed by mouth for severe pain. 90 tablet 1  . triamterene-hydrochlorothiazide (MAXZIDE-25) 37.5-25 MG tablet TAKE 1 TABLET BY MOUTH EVERY DAY FOR BLOOD PRESSURE 90 tablet 3  . UNABLE TO FIND Med Name: Cranial Prothesis for Chemotherapy 1 each 0   No current facility-administered medications for this visit.    Facility-Administered Medications Ordered in Other Visits  Medication Dose Route Frequency Provider Last Rate Last Dose  . hyaluronate sodium (RADIAPLEXRX) gel   Topical Once WentThea Silversmith      . sodium chloride flush (NS) 0.9 % injection 10 mL  10 mL Intravenous PRN MohaCurt Bears      . topical emolient (BIAFINE) emulsion   Topical PRN WentThea Silversmith        SURGICAL HISTORY:  Past Surgical History:  Procedure Laterality Date  . BRONCHOSCOPY  02/2011  . CARDIAC CATHETERIZATION    . CATARACT EXTRACTION  03/12/2013   Left Eye   . DILATION AND CURETTAGE OF UTERUS  1980's  . EYE SURGERY Left 2014   Cataract  . FETAL BLOOD TRANSFUSION  09/16/11  . JOINT REPLACEMENT  04/13/10   Left Hip  . LUNG LOBECTOMY  1986   left  .  SEPTOPLASTY    . TOTAL HIP ARTHROPLASTY  01/2010    left  . TUBAL LIGATION  1980's    REVIEW OF SYSTEMS:  A comprehensive review of systems was negative except for: Constitutional: positive for fatigue Respiratory: positive for cough, dyspnea on exertion and sputum Musculoskeletal: positive for muscle weakness   PHYSICAL EXAMINATION: General appearance: alert, cooperative, fatigued and mild distress Head: Normocephalic, without obvious abnormality, atraumatic Neck: no adenopathy, no JVD, supple, symmetrical, trachea midline and thyroid not enlarged, symmetric, no tenderness/mass/nodules Lymph nodes: Cervical, supraclavicular, and axillary nodes normal. Resp: wheezes bilaterally Back: symmetric, no curvature. ROM normal. No CVA tenderness. Cardio: regular rate and rhythm, S1, S2 normal, no murmur, click, rub or gallop GI: soft, non-tender; bowel sounds normal; no masses,  no organomegaly Extremities: extremities normal, atraumatic, no cyanosis or edema  ECOG PERFORMANCE STATUS: 1 - Symptomatic but completely ambulatory  Blood pressure (!) 162/58, pulse (!) 103, temperature 99.7 F (37.6 C), temperature source Oral, resp. rate 18, height 5' 5.5" (1.664 m), weight 172 lb 14.4 oz (78.4 kg), SpO2 100 %.  LABORATORY DATA: Lab Results  Component Value Date   WBC 13.0 (H) 06/16/2017   HGB 12.9 06/16/2017   HCT 39.9 06/16/2017   MCV 92.1 06/16/2017   PLT 189 06/16/2017      Chemistry      Component Value Date/Time   NA 137 06/16/2017 1124   NA 141 04/07/2017 1016   K 3.2 (L) 06/16/2017 1124   K 3.4 (L) 04/07/2017 1016   CL 98 06/16/2017 1124   CL 99 07/21/2012 1125   CO2 29 06/16/2017 1124   CO2 27 04/07/2017 1016   BUN 16 06/16/2017 1124   BUN 13.8 04/07/2017 1016   CREATININE 0.89 06/16/2017 1124   CREATININE 1.1 04/07/2017 1016      Component Value Date/Time   CALCIUM 9.7 06/16/2017 1124   CALCIUM 9.5 04/07/2017 1016   ALKPHOS 101 06/16/2017 1124   ALKPHOS 94 04/07/2017 1016   AST 20 06/16/2017 1124   AST 13 04/07/2017  1016   ALT 18 06/16/2017 1124   ALT 8 04/07/2017 1016   BILITOT 0.7 06/16/2017 1124   BILITOT 0.36 04/07/2017 1016       RADIOGRAPHIC STUDIES: Dg Chest 2 View  Result Date: 06/09/2017 CLINICAL DATA:  Cough, congestion, shortness of breath, low-grade fever EXAM: CHEST  2 VIEW COMPARISON:  01/03/2017 FINDINGS: Postoperative changes on the left. Persistent density noted in the inferior right upper lobe. This is similar to prior study. Heart is normal size. No confluent opacity on the left. No effusions or acute bony abnormality. Old right rib fractures noted. IMPRESSION: Persistent bandlike airspace density in the inferior right upper lobe, not significantly changed since prior study, presumably post treatment related. Postoperative changes on the left. No definite acute process. Electronically Signed   By: Rolm Baptise M.D.   On: 06/09/2017 19:34    ASSESSMENT AND PLAN:  This is a very pleasant 72 years old white female with recurrent non-small cell lung cancer, adenocarcinoma that was initially diagnosed as unresectable stage IIA in December 2012 status post systemic chemotherapy as well as immunotherapy.  She is currently undergoing treatment with immunotherapy with Nivolumab 240 mg IV every 2 weeks.  She is status post 7 cycles of the new regimen. She has been tolerating the treatment well but over the last few weeks she has been complaining of increasing shortness of breath as well as cough productive of sputum.  She was treated  with 3 courses of antibiotics but she is not feeling well.  She also treated with the steroids. I recommended for the patient to hold her treatment with Nivolumab today.  I will arrange for her to have repeat CT scan of the chest for reevaluation of her disease before resuming her treatment.  She may be also interested in taking a break of treatment of her scan looks good. I recommended for her to proceed with the prednisone Dosepak as prescribed by [name]. I will  see her back for follow-up visit in 1 week for evaluation and discussion of her scan results and treatment options. The patient was advised to call immediately if she has any concerning symptoms in the interval. The patient voices understanding of current disease status and treatment options and is in agreement with the current care plan. All questions were answered. The patient knows to call the clinic with any problems, questions or concerns. We can certainly see the patient much sooner if necessary.  Disclaimer: This note was dictated with voice recognition software. Similar sounding words can inadvertently be transcribed and may not be corrected upon review.

## 2017-06-20 ENCOUNTER — Telehealth: Payer: Self-pay | Admitting: Internal Medicine

## 2017-06-20 NOTE — Telephone Encounter (Signed)
Spoke to patient regarding upcoming march appointments. Patient scheduled per 3/7 los.

## 2017-06-21 ENCOUNTER — Telehealth: Payer: Self-pay | Admitting: *Deleted

## 2017-06-21 ENCOUNTER — Telehealth: Payer: Self-pay | Admitting: Internal Medicine

## 2017-06-21 NOTE — Telephone Encounter (Signed)
R/s appts per 3/7 los - patient to get an updated schedule next visit.

## 2017-06-21 NOTE — Telephone Encounter (Signed)
Returned call to pt to clarify upcoming appts. T scan 3/13 at 315pm. Lab/MD 3/15.  Pt will discuss with MD scan results and if she would like to proceed with treatment or take a break. Explained above with pt who confirmed understanding. No further concerns.

## 2017-06-22 ENCOUNTER — Other Ambulatory Visit: Payer: Self-pay | Admitting: Internal Medicine

## 2017-06-22 ENCOUNTER — Ambulatory Visit (HOSPITAL_COMMUNITY)
Admission: RE | Admit: 2017-06-22 | Discharge: 2017-06-22 | Disposition: A | Discharge status: Home / Self Care | Payer: Medicare Other | Source: Ambulatory Visit | Attending: Internal Medicine | Admitting: Internal Medicine

## 2017-06-22 DIAGNOSIS — I2510 Atherosclerotic heart disease of native coronary artery without angina pectoris: Secondary | ICD-10-CM | POA: Diagnosis not present

## 2017-06-22 DIAGNOSIS — I700 Atherosclerosis of aorta: Secondary | ICD-10-CM | POA: Insufficient documentation

## 2017-06-22 DIAGNOSIS — C3490 Malignant neoplasm of unspecified part of unspecified bronchus or lung: Secondary | ICD-10-CM

## 2017-06-22 DIAGNOSIS — J439 Emphysema, unspecified: Secondary | ICD-10-CM | POA: Insufficient documentation

## 2017-06-22 DIAGNOSIS — J90 Pleural effusion, not elsewhere classified: Secondary | ICD-10-CM | POA: Insufficient documentation

## 2017-06-22 MED ORDER — SODIUM CHLORIDE 0.9 % IJ SOLN
INTRAMUSCULAR | Status: AC
  Filled 2017-06-22: qty 50

## 2017-06-22 MED ORDER — IOPAMIDOL (ISOVUE-300) INJECTION 61%
INTRAVENOUS | Status: AC
  Filled 2017-06-22: qty 75

## 2017-06-23 ENCOUNTER — Ambulatory Visit (HOSPITAL_COMMUNITY): Payer: Medicare Other

## 2017-06-23 ENCOUNTER — Telehealth: Payer: Self-pay | Admitting: Emergency Medicine

## 2017-06-23 DIAGNOSIS — K8020 Calculus of gallbladder without cholecystitis without obstruction: Secondary | ICD-10-CM

## 2017-06-23 NOTE — Telephone Encounter (Signed)
Spoke with pt. She is requesting a refill on Norco. At her last refill she asked [name] to decrease her to #60 a month, she wants to go back to #90 a month. Her last refill was on 05/23/17.  [name] - please advise. Thanks.

## 2017-06-24 ENCOUNTER — Encounter: Payer: Self-pay | Admitting: Oncology

## 2017-06-24 ENCOUNTER — Inpatient Hospital Stay (HOSPITAL_BASED_OUTPATIENT_CLINIC_OR_DEPARTMENT_OTHER): Payer: Medicare Other | Admitting: Oncology

## 2017-06-24 ENCOUNTER — Inpatient Hospital Stay: Payer: Medicare Other

## 2017-06-24 VITALS — BP 152/41 | HR 87 | Temp 98.8°F | Resp 18 | Ht 65.5 in | Wt 178.6 lb

## 2017-06-24 DIAGNOSIS — R0602 Shortness of breath: Secondary | ICD-10-CM

## 2017-06-24 DIAGNOSIS — Z5112 Encounter for antineoplastic immunotherapy: Secondary | ICD-10-CM | POA: Diagnosis not present

## 2017-06-24 DIAGNOSIS — C3411 Malignant neoplasm of upper lobe, right bronchus or lung: Secondary | ICD-10-CM | POA: Diagnosis not present

## 2017-06-24 DIAGNOSIS — R042 Hemoptysis: Secondary | ICD-10-CM | POA: Diagnosis not present

## 2017-06-24 DIAGNOSIS — R05 Cough: Secondary | ICD-10-CM | POA: Diagnosis not present

## 2017-06-24 DIAGNOSIS — J449 Chronic obstructive pulmonary disease, unspecified: Secondary | ICD-10-CM | POA: Diagnosis not present

## 2017-06-24 DIAGNOSIS — C3490 Malignant neoplasm of unspecified part of unspecified bronchus or lung: Secondary | ICD-10-CM

## 2017-06-24 DIAGNOSIS — R509 Fever, unspecified: Secondary | ICD-10-CM | POA: Diagnosis not present

## 2017-06-24 DIAGNOSIS — J439 Emphysema, unspecified: Secondary | ICD-10-CM | POA: Diagnosis not present

## 2017-06-24 LAB — CMP (CANCER CENTER ONLY)
ALT: 14 U/L (ref 0–55)
AST: 13 U/L (ref 5–34)
Albumin: 3.5 g/dL (ref 3.5–5.0)
Alkaline Phosphatase: 88 U/L (ref 40–150)
Anion gap: 10 (ref 3–11)
BUN: 23 mg/dL (ref 7–26)
CO2: 26 mmol/L (ref 22–29)
Calcium: 9.7 mg/dL (ref 8.4–10.4)
Chloride: 106 mmol/L (ref 98–109)
Creatinine: 1.07 mg/dL (ref 0.60–1.10)
GFR, Est AFR Am: 58 mL/min — ABNORMAL LOW (ref >60)
GFR, Estimated: 50 mL/min — ABNORMAL LOW (ref >60)
Glucose, Bld: 118 mg/dL (ref 70–140)
Potassium: 3.9 mmol/L (ref 3.5–5.1)
Sodium: 142 mmol/L (ref 136–145)
Total Bilirubin: 0.4 mg/dL (ref 0.2–1.2)
Total Protein: 6.5 g/dL (ref 6.4–8.3)

## 2017-06-24 LAB — CBC WITH DIFFERENTIAL (CANCER CENTER ONLY)
Basophils Absolute: 0.1 10*3/uL (ref 0.0–0.1)
Basophils Relative: 1 %
Eosinophils Absolute: 0.3 10*3/uL (ref 0.0–0.5)
Eosinophils Relative: 2 %
HCT: 36.8 % (ref 34.8–46.6)
Hemoglobin: 12.1 g/dL (ref 11.6–15.9)
Lymphocytes Relative: 12 %
Lymphs Abs: 1.2 10*3/uL (ref 0.9–3.3)
MCH: 30.2 pg (ref 25.1–34.0)
MCHC: 33 g/dL (ref 31.5–36.0)
MCV: 91.6 fL (ref 79.5–101.0)
Monocytes Absolute: 0.3 10*3/uL (ref 0.1–0.9)
Monocytes Relative: 3 %
Neutro Abs: 8.7 10*3/uL — ABNORMAL HIGH (ref 1.5–6.5)
Neutrophils Relative %: 82 %
Platelet Count: 260 10*3/uL (ref 145–400)
RBC: 4.01 MIL/uL (ref 3.70–5.45)
RDW: 14.2 % (ref 11.2–14.5)
WBC Count: 10.5 10*3/uL — ABNORMAL HIGH (ref 3.9–10.3)

## 2017-06-24 NOTE — Assessment & Plan Note (Signed)
This is a very pleasant 72 year old white female with recurrent non-small cell lung cancer, adenocarcinoma that was initially diagnosed as unresectable stage IIA in December 2012 status post systemic chemotherapy as well as immunotherapy.  She is currently undergoing treatment with immunotherapy with Nivolumab 240 mg IV every 2 weeks.  She is status post 7 cycles of the new regimen. She has been tolerating the treatment well but over the last few weeks she has been complaining of increasing shortness of breath as well as cough productive of sputum.  She was treated with 3 courses of antibiotics and steroids but she is not feeling well.   She had a recent restaging CT scan of the chest and is here to discuss the results and treatment options.  The patient was seen with [name].  CT scan results and images were reviewed with the patient and her son.  CT scan shows improvement in her lung cancer.  There is no evidence of pneumonia or other infection.  Discussed with the patient and her son that she can take a break from her immunotherapy for 2-3 months and then repeat a restaging CT scan versus continuing on her current therapy.  The patient states that she would like to skip her treatment that is scheduled for today and then resume treatment in about 2 weeks when she is already scheduled back to see [place].  We will see her back for follow-up on 07/07/2017 as already scheduled for evaluation prior to cycle #8 of her treatment.  The patient will follow-up with pulmonology as scheduled next week for her ongoing cough and management of her COPD.  The patient was advised to call immediately if she has any concerning symptoms in the interval. The patient voices understanding of current disease status and treatment options and is in agreement with the current care plan. All questions were answered. The patient knows to call the clinic with any problems, questions or concerns. We can certainly see the patient much  sooner if necessary.

## 2017-06-24 NOTE — Telephone Encounter (Signed)
Yes, Ok to fill the script # 90 nad to make the GI referral

## 2017-06-24 NOTE — Telephone Encounter (Signed)
yes

## 2017-06-24 NOTE — Progress Notes (Signed)
[facility] OFFICE PROGRESS NOTE  Kathleen Borg, MD [place] [address]  DIAGNOSIS: Recurrent non-small cell lung cancer initially diagnosed as a stage IIA adenocarcinoma in December 2012 with no actionable mutations.   PRIOR THERAPY: 1) Status post radiation therapy to the right upper lobe lung mass completed on 06/04/2011 under the care of Dr. Pablo Ledger.  2) Systemic chemotherapy with carboplatin for AUC of 5 and Alimta 500 mg/M2 giving every 3 weeks, status post 3 cycles, last dose was given on 07/21/2011.  3) Systemic chemotherapy with carboplatin for AUC of 5 and Alimta 500 MG/M2 every 3 weeks. First dose on 01/02/2015. Status post 2 cycles, last dose was given 01/30/2015. Her treatment is currently on hold secondary to intolerance. 4) Immunotherapy with Nivolumab 240 M IV every 2 weeks status post 14 cycles.  5)immunotherapy with Nivolumab 480 mg IV every 4 weeks. Patient is status post 1 cycle. The patient discontinued treatment with Nivolumab every 4 weeks because she want to switch back to every 2 weeks due to increased side effects with the monthly dosing.  CURRENT THERAPY: The patient will resume her treatment again with immunotherapy with Nivolumab 240 mg IV every 2 weeks. First dose 09/15/2016. Status post 7 cycles.  INTERVAL HISTORY: KYLEENA SCHEIRER 72 y.o. female returns for routine follow-up visit accompanied by her son.  Her treatment was held last week due to upper respiratory symptoms.  The patient reports that she is still not feeling well.  She continues to have cough and occasionally has blood streaks in her sputum.  She has completed her most recent course of antibiotics and took her last dose of steroid this morning.  Patient denies fevers and chills.  She denies chest pain.  She has her baseline shortness of breath and wears home oxygen.  Denies nausea, vomiting, constipation, diarrhea.  Denies recent weight loss or night sweats.   Reports pain in her upper right back which she thinks is related to having to raise her arms above her head for the CT scan.  She uses tramadol alternating with hydrocodone.  She had a recent restaging CT scan of the chest and is here to discuss the results.  MEDICAL HISTORY: Past Medical History:  Diagnosis Date  . Adenocarcinoma, lung ([place])    upper left lobe  . Anemia in neoplastic disease   . Anxiety   . Arthritis    "all over my body"  . Asthma   . Asymptomatic varicose veins   . Atherosclerosis of native arteries of the extremities, unspecified   . Atrial fibrillation ([place])   . Bronchial pneumonia    history  . 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 Chemotherapy adverse reaction 09/15/11   "makes me light headed and pass out; this is 3rd blood transfusion since going thru it"  . CHF (congestive heart failure) ([place])   . Chronic airway obstruction, not elsewhere classified   . Chronic fatigue 08/19/2015  . Chronic sinusitis   . Complication of anesthesia    slow to wake up  . COPD with emphysema ([place])   . Coronary artery disease    Dr. Burt Knack had stress test done  . DDD (degenerative disc disease), lumbar   . Depressive disorder, not elsewhere classified   . Diseases of lips   . Disorder of bone and cartilage, unspecified   . DVT (deep venous thrombosis) ([place])   . Fibromyalgia   . Functional urinary incontinence   .  GERD (gastroesophageal reflux disease)   . Hard of hearing, right   . Herpes zoster without mention of complication   . Hip pain, chronic 09/17/2014  . History of blood clots    "2 in my aorta"  . History of blood transfusion   . History of bronchitis   . History of echocardiogram    Echo 11/18: mild LVH, EF 60-65, no RWMA, Gr 1 DD, Ao Sclerosis, mild AI, RVH with low nl RSVF, trivial eff  . Hx of radiation therapy 04/28/11 -06/08/11   right upper lobe-lung  . Hyperlipidemia   . Hypertension   . Kidney infection    "related to chemo"  . Lumbago   .  Malignant neoplasm of bronchus and lung, unspecified site   . Mental disorder   . Muscle weakness (generalized)   . Non-small cell lung cancer ([place]) 03/31/2011   Adenocarcinoma Rt upper lobe mass  . Osteoarthrosis, unspecified whether generalized or localized, unspecified site   . Osteoporosis   . Other abnormal blood chemistry   . Other and unspecified hyperlipidemia   . PAD (peripheral artery disease) ([place])   . Pain in thoracic spine   . 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 Primary cancer of right upper lobe of lung ([place]) 02/08/2011   Biopsy Rt upper lobe mass 03/31/11>>adenocarcinoma DIAGNOSIS: Stage IIB/IIIA non-small cell lung cancer consistent with adenocarcinoma diagnosed in December 2012.   PRIOR THERAPY:  1) Status post radiation therapy to the right upper lobe lung mass completed on 06/04/2011 under the care of Dr. Pablo Ledger.  2) Systemic chemotherapy with carboplatin for AUC of 5 and Alimta 500 mg/M2 giving every 3   . Reflux   . Reflux esophagitis   . Respiratory failure with hypoxia 01/02/2010  . Restless legs syndrome (RLS)   . Scoliosis   . Screening for thyroid disorder   . Shortness of breath    "anytime really"  . Sinus headache    "all the time"  . Spasm of muscle   . Syncope and collapse   . Type II or unspecified type diabetes mellitus with neurological manifestations, uncontrolled(250.62)   . Type II or unspecified type diabetes mellitus without mention of complication, not stated as uncontrolled   . Type II or unspecified type diabetes mellitus without mention of complication, not stated as uncontrolled   . Unspecified constipation   . Unspecified essential hypertension   . Unspecified hearing loss   . Unspecified hereditary and idiopathic peripheral neuropathy   . Unspecified vitamin D deficiency     ALLERGIES:  is allergic to iohexol; other; anoro ellipta [umeclidinium-vilanterol]; codeine; and protonix [pantoprazole sodium].  MEDICATIONS:  Current  Outpatient Medications  Medication Sig Dispense Refill  . ACCU-CHEK SOFTCLIX LANCETS lancets by Other route. Check blood sugar twice daily. Dx: E11.9, E11.49    . albuterol (PROVENTIL HFA;VENTOLIN HFA) 108 (90 Base) MCG/ACT inhaler Inhale 2 puffs into the lungs every 6 (six) hours as needed for wheezing or shortness of breath. 1 Inhaler 6  . albuterol (PROVENTIL) (2.5 MG/3ML) 0.083% nebulizer solution Take 3 mLs (2.5 mg total) by nebulization every 6 (six) hours as needed for wheezing or shortness of breath. 75 mL 5  . ALPRAZolam (XANAX) 1 MG tablet Take 1 tablet (1 mg total) by mouth 3 (three) times daily as needed. for anxiety 90 tablet 2  . aspirin 81 MG tablet Take 81 mg by mouth daily.    . benzonatate (TESSALON) 100 MG capsule Take 1 capsule (100 mg total) by  mouth 3 (three) times daily as needed for cough. 40 capsule 1  . Blood Glucose Monitoring Suppl (ACCU-CHEK AVIVA PLUS) w/Device KIT Use as directed to check blood sugar Dx: E11.9, E11.49 1 kit 0  . Calcium Carbonate-Vit D-Min (CALTRATE PLUS PO) Take 1 tablet by mouth daily.    Marland Kitchen DIPHENHIST 25 MG capsule TAKE 1 CAPSULE BY MOUTH 2 HOURS PRIOR TO CT SCAN 10 capsule 0  . fexofenadine (ALLEGRA) 180 MG tablet Take 180 mg by mouth daily.    . fluticasone (FLONASE) 50 MCG/ACT nasal spray SHAKE LIQUID WELL AND USE 2 SPRAYS IN EACH NOSTRIL TWICE DAILY 16 g 5  . Fluticasone-Umeclidin-Vilant (TRELEGY ELLIPTA) 100-62.5-25 MCG/INH AEPB Inhale 1 puff daily into the lungs. 1 each 6  . folic acid (FOLVITE) 1 MG tablet Take 1 tablet (1 mg total) by mouth daily. 30 tablet 4  . HYDROcodone-acetaminophen (NORCO) 10-325 MG tablet Take 1 tablet by mouth every 6 (six) hours as needed. 30 tablet 0  . levofloxacin (LEVAQUIN) 500 MG tablet Take 1 tablet (500 mg total) by mouth daily. 5 tablet 0  . loratadine (CLARITIN) 10 MG tablet TAKE 1 TABLET BY MOUTH EVERY DAY 30 tablet 5  . metFORMIN (GLUCOPHAGE) 500 MG tablet Take 1 tablet (500 mg total) daily as needed by  mouth (high blood sugar- 160+). Take one tablet at night for blood sugar 90 tablet 3  . OXYGEN Inhale 4 L into the lungs.    . potassium chloride (K-DUR,KLOR-CON) 10 MEQ tablet Take 1 tablet (10 mEq total) daily by mouth. 90 tablet 3  . predniSONE (DELTASONE) 10 MG tablet 58mX3 days, 342mX3 days, 2050m3 days, 35m72mdays, then stop. 30 tablet 0  . predniSONE (DELTASONE) 20 MG tablet Take 1 tablet (20 mg total) by mouth daily with breakfast. 7 tablet 0  . ranitidine (ZANTAC) 150 MG tablet Take 1 tablet (150 mg total) by mouth 2 (two) times daily. 180 tablet 3  . senna (SENOKOT) 8.6 MG tablet Take 1 tablet by mouth daily.    . traMADol (ULTRAM) 50 MG tablet Take 2 tablets (100 mg total) every 6 (six) hours as needed by mouth for severe pain. 90 tablet 1  . triamterene-hydrochlorothiazide (MAXZIDE-25) 37.5-25 MG tablet TAKE 1 TABLET BY MOUTH EVERY DAY FOR BLOOD PRESSURE 90 tablet 3  . UNABLE TO FIND Med Name: Cranial Prothesis for Chemotherapy 1 each 0  . furosemide (LASIX) 40 MG tablet Take 1 tablet (40 mg total) by mouth daily for 3 doses. 3 tablet 0   No current facility-administered medications for this visit.    Facility-Administered Medications Ordered in Other Visits  Medication Dose Route Frequency Provider Last Rate Last Dose  . hyaluronate sodium (RADIAPLEXRX) gel   Topical Once WentThea Silversmith      . sodium chloride flush (NS) 0.9 % injection 10 mL  10 mL Intravenous PRN MohaCurt Bears      . topical emolient (BIAFINE) emulsion   Topical PRN WentThea Silversmith        SURGICAL HISTORY:  Past Surgical History:  Procedure Laterality Date  . BRONCHOSCOPY  02/2011  . CARDIAC CATHETERIZATION    . CATARACT EXTRACTION  03/12/2013   Left Eye   . DILATION AND CURETTAGE OF UTERUS  1980's  . EYE SURGERY Left 2014   Cataract  . FETAL BLOOD TRANSFUSION  09/16/11  . JOINT REPLACEMENT  04/13/10   Left Hip  . LUNG LOBECTOMY  1986   left  . SEPTOPLASTY    .  TOTAL HIP  ARTHROPLASTY  01/2010   left  . TUBAL LIGATION  1980's    REVIEW OF SYSTEMS:   Review of Systems  Constitutional: Negative for appetite change, chills, fever and unexpected weight change. Positive for fatigue. HENT:   Negative for mouth sores, nosebleeds, sore throat and trouble swallowing.   Eyes: Negative for eye problems and icterus.  Respiratory: Negative for wheezing.  Positive for baseline shortness of breath and wears home oxygen.  Reports ongoing cough with sputum production and occasional blood streaked in the sputum.  Cardiovascular: Negative for chest pain and leg swelling.  Gastrointestinal: Negative for abdominal pain, constipation, diarrhea, nausea and vomiting.  Genitourinary: Negative for bladder incontinence, difficulty urinating, dysuria, frequency and hematuria.   Musculoskeletal: Negative for neck pain and neck stiffness. Positive for upper back pain. Skin: Negative for itching and rash.  Neurological: Negative for dizziness, extremity weakness, headaches, light-headedness and seizures.  Hematological: Negative for adenopathy. Does not bruise/bleed easily.  Psychiatric/Behavioral: Negative for confusion, depression and sleep disturbance. The patient is not nervous/anxious.     PHYSICAL EXAMINATION:  Blood pressure (!) 152/41, pulse 87, temperature 98.8 F (37.1 C), temperature source Oral, resp. rate 18, height 5' 5.5" (1.664 m), weight 178 lb 9.6 oz (81 kg), SpO2 97 %.  ECOG PERFORMANCE STATUS: 1 - Symptomatic but completely ambulatory  Physical Exam  Constitutional: Oriented to person, place, and time. No distress.  HENT:  Head: Normocephalic and atraumatic.  Mouth/Throat: Oropharynx is clear and moist. No oropharyngeal exudate.  Eyes: Conjunctivae are normal. Right eye exhibits no discharge. Left eye exhibits no discharge. No scleral icterus.  Neck: Normal range of motion. Neck supple.  Cardiovascular: Normal rate, regular rhythm, normal heart sounds and  intact distal pulses.   Pulmonary/Chest: Effort normal. No respiratory distress.  Diminished breath sounds bilaterally.  No wheezes. No rales.  Abdominal: Soft. Bowel sounds are normal. Exhibits no distension and no mass. There is no tenderness.  Musculoskeletal: Normal range of motion. Exhibits no edema.  Lymphadenopathy:    No cervical adenopathy.  Neurological: Alert and oriented to person, place, and time. Exhibits normal muscle tone. Coordination normal.  Skin: Skin is warm and dry. No rash noted. Not diaphoretic. No erythema. No pallor.  Psychiatric: Mood, memory and judgment normal.  Vitals reviewed.  LABORATORY DATA: Lab Results  Component Value Date   WBC 10.5 (H) 06/24/2017   HGB 12.9 06/16/2017   HCT 36.8 06/24/2017   MCV 91.6 06/24/2017   PLT 260 06/24/2017      Chemistry      Component Value Date/Time   NA 142 06/24/2017 1028   NA 141 04/07/2017 1016   K 3.9 06/24/2017 1028   K 3.4 (L) 04/07/2017 1016   CL 106 06/24/2017 1028   CL 99 07/21/2012 1125   CO2 26 06/24/2017 1028   CO2 27 04/07/2017 1016   BUN 23 06/24/2017 1028   BUN 13.8 04/07/2017 1016   CREATININE 1.07 06/24/2017 1028   CREATININE 1.1 04/07/2017 1016      Component Value Date/Time   CALCIUM 9.7 06/24/2017 1028   CALCIUM 9.5 04/07/2017 1016   ALKPHOS 88 06/24/2017 1028   ALKPHOS 94 04/07/2017 1016   AST 13 06/24/2017 1028   AST 13 04/07/2017 1016   ALT 14 06/24/2017 1028   ALT 8 04/07/2017 1016   BILITOT 0.4 06/24/2017 1028   BILITOT 0.36 04/07/2017 1016       RADIOGRAPHIC STUDIES:  Dg Chest 2 View  Result Date: 06/09/2017  CLINICAL DATA:  Cough, congestion, shortness of breath, low-grade fever EXAM: CHEST  2 VIEW COMPARISON:  01/03/2017 FINDINGS: Postoperative changes on the left. Persistent density noted in the inferior right upper lobe. This is similar to prior study. Heart is normal size. No confluent opacity on the left. No effusions or acute bony abnormality. Old right rib  fractures noted. IMPRESSION: Persistent bandlike airspace density in the inferior right upper lobe, not significantly changed since prior study, presumably post treatment related. Postoperative changes on the left. No definite acute process. Electronically Signed   By: Rolm Baptise M.D.   On: 06/09/2017 19:34   Ct Chest Wo Contrast  Result Date: 06/23/2017 CLINICAL DATA:  Non-small-cell lung cancer. Originally diagnosed in 2012. Currently on immunotherapy. EXAM: CT CHEST WITHOUT CONTRAST TECHNIQUE: Multidetector CT imaging of the chest was performed following the standard protocol without IV contrast. COMPARISON:  03/24/2017 FINDINGS: Cardiovascular: Aortic and branch vessel atherosclerosis. Normal heart size, without pericardial effusion. Multivessel coronary artery atherosclerosis. Mediastinum/Nodes: No supraclavicular adenopathy. No well-defined mediastinal adenopathy. Hilar regions poorly evaluated without intravenous contrast. Lungs/Pleura: Small right-sided pleural effusion is decreased. 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 Right upper lobe endobronchial compression/narrowing is similar. Left upper lobectomy. Central right upper lobe nodular density measures 1.4 x 1.3 cm on image 62/7 versus similar on the prior exam (when remeasured). The 8 mm ground-glass nodule and adjacent 2 mm soft tissue nodule in the right lower lobe are felt to be similar, including on 94/7. Minimal nodularity along the right major fissure is felt to be new on image 78/7. Anteromedial right lower lobe band like consolidation could be treatment related and is similar. Minimal improvement in right upper lobe aeration with ground-glass and airspace disease remaining. Re-demonstration of collapse/consolidation in the posteromedial right upper lobe. Posterior left lower lobe atelectasis or scar. more anterior left lower lobe ground-glass opacity is similar, including on image 60/7. Upper Abdomen: Normal imaged portions of the  liver, spleen, stomach, pancreas, gallbladder, left kidney. Bilateral low-density adrenal nodularity is not significantly changed, favoring adenomas. Musculoskeletal: Mild osteopenia. Posttraumatic or postsurgical defects about the right chest wall/ribs. Status post T4 vertebral augmentation with similar mild to moderate T5 superior endplate compression deformity. IMPRESSION: 1. Slight improvement in right-sided aeration. Residual right upper lobe nodular density, not significantly changed. Areas of right upper lobe ground-glass and consolidation persist. 2. Aortic atherosclerosis (ICD10-I70.0), coronary artery atherosclerosis and emphysema (ICD10-J43.9). 3. Slight decrease in right pleural effusion. 4. Similar left lower lobe ground-glass opacity. Electronically Signed   By: Abigail Miyamoto M.D.   On: 06/23/2017 10:00     ASSESSMENT/PLAN:  Primary cancer of right upper lobe of lung [name]) This is a very pleasant 72 year old white female with recurrent non-small cell lung cancer, adenocarcinoma that was initially diagnosed as unresectable stage IIA in December 2012 status post systemic chemotherapy as well as immunotherapy.  She is currently undergoing treatment with immunotherapy with Nivolumab 240 mg IV every 2 weeks.  She is status post 7 cycles of the new regimen. She has been tolerating the treatment well but over the last few weeks she has been complaining of increasing shortness of breath as well as cough productive of sputum.  She was treated with 3 courses of antibiotics and steroids but she is not feeling well.   She had a recent restaging CT scan of the chest and is here to discuss the results and treatment options.  The patient was seen with [name].  CT scan results and images  were reviewed with the patient and her son.  CT scan shows improvement in her lung cancer.  There is no evidence of pneumonia or other infection.  Discussed with the patient and her son that she can take a break from  her immunotherapy for 2-3 months and then repeat a restaging CT scan versus continuing on her current therapy.  The patient states that she would like to skip her treatment that is scheduled for today and then resume treatment in about 2 weeks when she is already scheduled back to see [place].  We will see her back for follow-up on 07/07/2017 as already scheduled for evaluation prior to cycle #8 of her treatment.  The patient will follow-up with pulmonology as scheduled next week for her ongoing cough and management of her COPD.  The patient was advised to call immediately if she has any concerning symptoms in the interval. The patient voices understanding of current disease status and treatment options and is in agreement with the current care plan. All questions were answered. The patient knows to call the clinic with any problems, questions or concerns. We can certainly see the patient much sooner if necessary.  No orders of the defined types were placed in this encounter.  Mikey Bussing, DNP, AGPCNP-BC, AOCNP 06/24/17  ADDENDUM: Hematology/Oncology Attending: I had a face-to-face encounter with the patient.  I recommended her care plan.  This is a very pleasant 72 years old white female with recurrent non-small cell lung cancer.  She status post several regiment of chemotherapy as well as immunotherapy.  She is currently on treatment with Nivolumab 240 mg IV every 2 weeks status post 7 cycles.  The patient has been tolerating this treatment fairly well.  She has been complaining of shortness of breath recently and treated for COPD exacerbation and questionable pneumonia by her pulmonologist with a course of antibiotics and taper dose of steroid.  The patient had repeat CT scan of the chest performed recently.  I personally and independently reviewed the scan results and images with the patient and her son. Her scan showed no concerning findings for disease progression and she continues to have mild  decrease in the right upper lobe lung mass. I recommended for the patient to continue her current treatment with immunotherapy but she was also given the option of taking a break of treatment for 2-3 months if needed.  The patient would like to continue with the treatment but would like some delay by 1 or 2 more weeks until she has improvement in her pulmonary status. I agreed with her decision and she will resume her treatment again and around 2 weeks. She was advised to call immediately if she has any concerning symptoms in the interval.  Disclaimer: This note was dictated with voice recognition software. Similar sounding words can inadvertently be transcribed and may be missed upon review. Eilleen Kempf, MD 06/26/17

## 2017-06-24 NOTE — Telephone Encounter (Signed)
Dr. Lake Bells - would you be willing to sign this prescription as [name] is not here today? Thanks!

## 2017-06-24 NOTE — Telephone Encounter (Signed)
Pt want to pick up the Norco. I don't see it in the folder. Pt wants RB to make an appt. For a gastrologist.   [phone]

## 2017-06-24 NOTE — Telephone Encounter (Signed)
Called and spoke with patient, advised her that we are waiting on RB response for the medication. Patient states that she is wanting a referral to the gastroenterologist for gallstones. She states that with her cancer and the COPD its causing her to have terrible issue.    RB please advise can we refill the prescription and send a referral. Thanks.

## 2017-06-27 ENCOUNTER — Telehealth: Payer: Self-pay | Admitting: Oncology

## 2017-06-27 ENCOUNTER — Encounter: Payer: Self-pay | Admitting: Gastroenterology

## 2017-06-27 MED ORDER — HYDROCODONE-ACETAMINOPHEN 10-325 MG PO TABS: 1.0000 | ORAL_TABLET | Freq: Four times a day (QID) | ORAL | 0 refills | Status: DC | PRN

## 2017-06-27 NOTE — Telephone Encounter (Signed)
Rx for Norco has been placed up front by Ria Comment for pickup.  GI referral has been placed. Pt is aware and voiced her understanding. Nothing further is needed.

## 2017-06-27 NOTE — Telephone Encounter (Signed)
Appts already scheduled per 3/15 los.

## 2017-06-28 ENCOUNTER — Other Ambulatory Visit: Payer: Self-pay | Admitting: Internal Medicine

## 2017-06-28 ENCOUNTER — Other Ambulatory Visit: Payer: Self-pay | Admitting: Nurse Practitioner

## 2017-06-28 ENCOUNTER — Ambulatory Visit (INDEPENDENT_AMBULATORY_CARE_PROVIDER_SITE_OTHER): Payer: Medicare Other | Admitting: Emergency Medicine

## 2017-06-28 ENCOUNTER — Encounter: Payer: Self-pay | Admitting: Emergency Medicine

## 2017-06-28 DIAGNOSIS — J320 Chronic maxillary sinusitis: Secondary | ICD-10-CM

## 2017-06-28 DIAGNOSIS — C3411 Malignant neoplasm of upper lobe, right bronchus or lung: Secondary | ICD-10-CM

## 2017-06-28 DIAGNOSIS — M542 Cervicalgia: Secondary | ICD-10-CM

## 2017-06-28 DIAGNOSIS — J449 Chronic obstructive pulmonary disease, unspecified: Secondary | ICD-10-CM | POA: Diagnosis not present

## 2017-06-28 NOTE — Assessment & Plan Note (Signed)
Chronic nasal drainage and sinus drainage is impacting her respiratory status.  She gets choked on the mucus, she has difficulty managing and coughing up the mucus.  I believe we need to add nasal saline washes to her existing rhinitis regimen.

## 2017-06-28 NOTE — Assessment & Plan Note (Signed)
Some radiographical improvement since she has been on immunotherapy.  She has had some difficulty tolerating and it has been stopped intermittently due to her respiratory symptoms.  The goal is to restart in the next 2 weeks.

## 2017-06-28 NOTE — Assessment & Plan Note (Signed)
Please start nasal saline rinses every day Continue flonase and loratadine (or allegra) Please continue Trelegy daily Continue your albuterol up to every 4 hours if needed for shortness of breath  Please discuss your Norco prescription with Dr. Jenny Reichmann.  [name] will feel this medication for you until you were able to your symptoms and overall medication plan with him.  Follow with Dr Lamonte Sakai in 4 months or sooner if you have any problems.

## 2017-06-28 NOTE — Progress Notes (Signed)
Subjective:    Patient ID: [name], female    DOB: [birth year], 72 y.o.   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 Most recent CT scan of the chest 11/11/16 that I have reviewed. This shows large scale stability but a slow progressive enlargement of a masslike predominantly groundglass attenuation and septal thickening of the right middle lobe. She remains on immunotherapy. She reports a couple of episodes of being awakened with acute dyspnea. She is having more cough for the last 2 weeks. Productive of green. She is having from L back pain. She has wheeze through the day. She has been followed by Dr Burt Knack with Cardiology, was to have a stress test but she did not attend.   ROV 05/05/17 --[name] is 23 and has a history of severe COPD and chronic hypoxemic respiratory failure, stage III a adenocarcinoma of the right upper lobe followed by [name].  She is on the nivolumab immunotherapy.  She was seen here 04/13/17 for an acute visit and treated for suspected COPD flare and bronchitis.  She received Augmentin. Had some improvement in congestion. She was then treated by oncology with bactrim. Currently she is having less chest congestion. Has head congestion on flonase, loratadine (or allegra). She does not feel that the Ventolin works for her, wants an alternative.   She is having a lot of back and joint pain, bony pain. She is also dealing w gallstones.     Acute visit 06/09/17 --this is an acute visit for [name].  She has a history of severe COPD with associated chronic hypoxemic respiratory failure.  She also has stage III a adenocarcinoma of the right upper lobe.  She has been on and off nivolumab immunotherapy, currently on it with her last dose given 2/21.  I treated her 2/19 with azithromycin and a prednisone taper because she was experiencing  dyspnea, cough and "getting strangled on fluid."On further questioning I think she is referring to thick clear secretions.  She has orthopnea. She is finishing the prednisone now. Her breathing and cough were better on abx and higher dose pred. Deniers any wt gain. She has some LE and hand edema. She is still on allegra / claritin and flonase.   ROV 06/28/17 --[name] with a history of severe COPD and chronic hypoxemic respiratory failure, stage III a adenocarcinoma of the right upper lobe for which she has been treated with nivolumab.  She has asked me to manage her Norco and I have been filling this for her.  I gave her a prednisone taper on 06/14/17 for increased congestion, cough.  Based on her continued pulmonary symptoms her immunotherapy has been rarely held.  A CT scan of her chest was done on 06/23/17 to look for interval change and I have reviewed.  This shows slight improvement in her right sided aeration with some residual right upper lobe nodular density that is unchanged.  Slight decrease in her right pleural effusion her left lower lobe groundglass opacity is unchanged. She has a lot of nasal clear drainage - is on allegra rotated w loratadine, flonase.      Objective:   Physical Exam Vitals:   06/28/17 1544  BP: (!) 152/62  Pulse: (!) 116  SpO2: 97%  Weight: 175 lb (79.4 kg)  Height: 5\' 5"  (1.651 m)   Gen: Pleasant, elderly Fry,  in no distress,  normal affect  ENT: No lesions,  mouth clear,  oropharynx clear, no postnasal drip  Neck: No JVD, no TMG, no carotid bruits  Lungs: No use of accessory muscles, distant, no wheeze  Cardiovascular: regular, 3/6 systolic M with an intact S2.    Musculoskeletal: No deformities, no cyanosis or clubbing.   Neuro: alert, non focal  Skin: Warm, no lesions or rashes   CBC Latest Ref Rng & Units 06/24/2017 06/16/2017 05/19/2017  WBC 3.9 - 10.3 K/uL 10.5(H) 13.0(H) 8.5  Hemoglobin 11.6 - 15.9 g/dL - 12.9 12.4  Hematocrit 34.8 -  46.6 % 36.8 39.9 36.9  Platelets 145 - 400 K/uL 260 189 221   BMET    Component Value Date/Time   NA 142 06/24/2017 1028   NA 141 04/07/2017 1016   K 3.9 06/24/2017 1028   K 3.4 (L) 04/07/2017 1016   CL 106 06/24/2017 1028   CL 99 07/21/2012 1125   CO2 26 06/24/2017 1028   CO2 27 04/07/2017 1016   GLUCOSE 118 06/24/2017 1028   GLUCOSE 93 04/07/2017 1016   GLUCOSE 184 (H) 07/21/2012 1125   BUN 23 06/24/2017 1028   BUN 13.8 04/07/2017 1016   CREATININE 1.07 06/24/2017 1028   CREATININE 1.1 04/07/2017 1016   CALCIUM 9.7 06/24/2017 1028   CALCIUM 9.5 04/07/2017 1016   GFRNONAA 50 (L) 06/24/2017 1028   GFRAA 58 (L) 06/24/2017 1028   CT scan of the chest 06/22/17  COMPARISON:  03/24/2017  FINDINGS: Cardiovascular: Aortic and branch vessel atherosclerosis. Normal heart size, without pericardial effusion. Multivessel coronary artery atherosclerosis.  Mediastinum/Nodes: No supraclavicular adenopathy. No well-defined mediastinal adenopathy. Hilar regions poorly evaluated without intravenous contrast.  Lungs/Pleura: Small right-sided pleural effusion is decreased. 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 Right upper lobe endobronchial compression/narrowing is similar. Left upper lobectomy.  Central right upper lobe nodular density measures 1.4 x 1.3 cm on image 62/7 versus similar on the prior exam (when remeasured).  The 8 mm ground-glass nodule and adjacent 2 mm soft tissue nodule in the right lower lobe are felt to be similar, including on 94/7. Minimal nodularity along the right major fissure is felt to be new on image 78/7.  Anteromedial right lower lobe band like consolidation could be treatment related and is similar. Minimal improvement in right upper lobe aeration with ground-glass and airspace disease remaining.  Re-demonstration of collapse/consolidation in the posteromedial right upper lobe.  Posterior left lower lobe atelectasis or scar. more  anterior left lower lobe ground-glass opacity is similar, including on image 60/7.  Upper Abdomen: Normal imaged portions of the liver, spleen, stomach, pancreas, gallbladder, left kidney. Bilateral low-density adrenal nodularity is not significantly changed, favoring adenomas.  Musculoskeletal: Mild osteopenia. Posttraumatic or postsurgical defects about the right chest wall/ribs. Status post T4 vertebral augmentation with similar mild to moderate T5 superior endplate compression deformity.  IMPRESSION: 1. Slight improvement in right-sided aeration. Residual right upper lobe nodular density, not significantly changed. Areas of right upper lobe ground-glass and consolidation persist. 2. Aortic atherosclerosis (ICD10-I70.0), coronary artery atherosclerosis and emphysema (ICD10-J43.9). 3. Slight decrease in right pleural effusion. 4. Similar left lower lobe ground-glass opacity.      Assessment & Plan:  COPD (chronic obstructive pulmonary disease) ([place]) Please start nasal saline rinses every day Continue flonase and loratadine (or allegra) Please continue Trelegy daily Continue your albuterol up to every 4 hours if needed for shortness of breath  Please discuss your Norco prescription with Dr. Jenny Reichmann.  Dr.  Chrystal Zeimet will feel this medication for you until you were able to your symptoms and overall medication plan with him.  Follow with Dr Lamonte Sakai in 4 months or sooner if you have any problems.  Primary cancer of right upper lobe of lung ([place]) Some radiographical improvement since she has been on immunotherapy.  She has had some difficulty tolerating and it has been stopped intermittently due to her respiratory symptoms.  The goal is to restart in the next 2 weeks.  Sinusitis, chronic Chronic nasal drainage and sinus drainage is impacting her respiratory status.  She gets choked on the mucus, she has difficulty managing and coughing up the mucus.  I believe we need to add nasal saline  washes to her existing rhinitis regimen.  Baltazar Apo, MD, PhD 06/28/2017, 4:01 PM [facility] [phone] or if no answer [phone]

## 2017-06-28 NOTE — Patient Instructions (Signed)
Please start nasal saline rinses every day Continue flonase and loratadine (or allegra) Please continue Trelegy daily Continue your albuterol up to every 4 hours if needed for shortness of breath Restart your nivolumab as planned by Dr Julien Nordmann.  Please discuss your Norco prescription with Dr. Jenny Reichmann.  [name] will feel this medication for you until you were able to your symptoms and overall medication plan with him.  Follow with Dr Lamonte Sakai in 4 months or sooner if you have any problems.

## 2017-06-29 ENCOUNTER — Other Ambulatory Visit: Payer: Self-pay | Admitting: Internal Medicine

## 2017-06-30 ENCOUNTER — Ambulatory Visit: Payer: Medicare Other | Admitting: Oncology

## 2017-06-30 ENCOUNTER — Ambulatory Visit: Payer: Medicare Other

## 2017-06-30 ENCOUNTER — Other Ambulatory Visit: Payer: Medicare Other

## 2017-07-04 ENCOUNTER — Other Ambulatory Visit (INDEPENDENT_AMBULATORY_CARE_PROVIDER_SITE_OTHER): Payer: Medicare Other

## 2017-07-04 ENCOUNTER — Ambulatory Visit (INDEPENDENT_AMBULATORY_CARE_PROVIDER_SITE_OTHER): Payer: Medicare Other | Admitting: Internal Medicine

## 2017-07-04 ENCOUNTER — Encounter: Payer: Self-pay | Admitting: Internal Medicine

## 2017-07-04 VITALS — BP 126/72 | HR 124 | Temp 98.4°F | Ht 65.0 in | Wt 176.0 lb

## 2017-07-04 DIAGNOSIS — K2100 Gastro-esophageal reflux disease with esophagitis, without bleeding: Secondary | ICD-10-CM

## 2017-07-04 DIAGNOSIS — E1142 Type 2 diabetes mellitus with diabetic polyneuropathy: Secondary | ICD-10-CM

## 2017-07-04 DIAGNOSIS — R350 Frequency of micturition: Secondary | ICD-10-CM | POA: Diagnosis not present

## 2017-07-04 DIAGNOSIS — G8929 Other chronic pain: Secondary | ICD-10-CM | POA: Diagnosis not present

## 2017-07-04 DIAGNOSIS — R000 Tachycardia, unspecified: Secondary | ICD-10-CM | POA: Diagnosis not present

## 2017-07-04 LAB — LIPID PANEL
Cholesterol: 207 mg/dL — ABNORMAL HIGH (ref 0–200)
HDL: 61.1 mg/dL (ref >39.00)
NONHDL: 146.21
Total CHOL/HDL Ratio: 3
Triglycerides: 275 mg/dL — ABNORMAL HIGH (ref 0.0–149.0)
VLDL: 55 mg/dL — ABNORMAL HIGH (ref 0.0–40.0)

## 2017-07-04 LAB — BASIC METABOLIC PANEL
BUN: 15 mg/dL (ref 6–23)
CALCIUM: 9.4 mg/dL (ref 8.4–10.5)
CO2: 33 meq/L — AB (ref 19–32)
CREATININE: 0.98 mg/dL (ref 0.40–1.20)
Chloride: 98 mEq/L (ref 96–112)
GFR: 59.07 mL/min — ABNORMAL LOW (ref >60.00)
GLUCOSE: 112 mg/dL — AB (ref 70–99)
Potassium: 3.4 mEq/L — ABNORMAL LOW (ref 3.5–5.1)
Sodium: 139 mEq/L (ref 135–145)

## 2017-07-04 LAB — URINALYSIS, ROUTINE W REFLEX MICROSCOPIC
BILIRUBIN URINE: NEGATIVE
KETONES UR: NEGATIVE
LEUKOCYTES UA: NEGATIVE
Nitrite: NEGATIVE
PH: 6 (ref 5.0–8.0)
Specific Gravity, Urine: <=1.005 — AB (ref 1.000–1.030)
Total Protein, Urine: NEGATIVE
UROBILINOGEN UA: 0.2 (ref 0.0–1.0)
Urine Glucose: NEGATIVE

## 2017-07-04 LAB — LDL CHOLESTEROL, DIRECT: Direct LDL: 131 mg/dL

## 2017-07-04 LAB — MICROALBUMIN / CREATININE URINE RATIO
Creatinine,U: 40.7 mg/dL
MICROALB/CREAT RATIO: 22.4 mg/g (ref 0.0–30.0)
Microalb, Ur: 9.1 mg/dL — ABNORMAL HIGH (ref 0.0–1.9)

## 2017-07-04 LAB — HEMOGLOBIN A1C: HEMOGLOBIN A1C: 5.9 % (ref 4.6–6.5)

## 2017-07-04 MED ORDER — OMEPRAZOLE 20 MG PO CPDR: 20.0000 mg | DELAYED_RELEASE_CAPSULE | Freq: Every day | ORAL | 3 refills | Status: DC

## 2017-07-04 MED ORDER — HYDROCODONE-ACETAMINOPHEN 10-325 MG PO TABS: 1.0000 | ORAL_TABLET | Freq: Two times a day (BID) | ORAL | 0 refills | Status: DC | PRN

## 2017-07-04 NOTE — Progress Notes (Signed)
Subjective:    Patient ID: [name], female    DOB: May 21, 1945, 72 y.o.   MRN: [id number]  HPI  Here to f/u, "just dont feel good" but cannot be otherwise specific except for 2-3 days dysuria and mild urinary frequency assoc with several episodes of loose non bloody stools yesterday but none today.  Denies worsening reflux, abd pain, dysphagia, n/v, bowel change or blood.  Pt denies chest pain, increased sob or doe, wheezing, orthopnea, PND, increased LE swelling, palpitations, dizziness or syncope.  Remains on 4L home o2.  Pt continues to have recurring LBP and right flank pain without change in severity, bowel or bladder change, fever, wt loss,  worsening LE pain/numbness/weakness, gait change or falls.   Has seen chronic pain management in the past she cannot afford and cannot go.  Was given recent hydrocodone per pulmonary after pt states was not given narcotic per oncology due to non malignant pain condition.  Son with her adamant she has pain from this.  No other new complaints except states zantac no longer working, asks for PPI but has had diarrhea from protonix before Wt Readings from Last 3 Encounters:  07/04/17 176 lb (79.8 kg)  06/28/17 175 lb (79.4 kg)  06/24/17 178 lb 9.6 oz (81 kg)   Past Medical History:  Diagnosis Date  . Adenocarcinoma, lung ([place])    upper left lobe  . Anemia in neoplastic disease   . Anxiety   . Arthritis    "all over my body"  . Asthma   . Asymptomatic varicose veins   . Atherosclerosis of native arteries of the extremities, unspecified   . Atrial fibrillation ([place])   . Bronchial pneumonia    history  . 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 Chemotherapy adverse reaction 09/15/11   "makes me light headed and pass out; this is 3rd blood transfusion since going thru it"  . CHF (congestive heart failure) ([place])   . Chronic airway obstruction, not elsewhere classified   . Chronic fatigue 08/19/2015  . Chronic sinusitis   . Complication of  anesthesia    slow to wake up  . COPD with emphysema ([place])   . Coronary artery disease    Dr. Burt Knack had stress test done  . DDD (degenerative disc disease), lumbar   . Depressive disorder, not elsewhere classified   . Diseases of lips   . Disorder of bone and cartilage, unspecified   . DVT (deep venous thrombosis) ([place])   . Fibromyalgia   . Functional urinary incontinence   . GERD (gastroesophageal reflux disease)   . Hard of hearing, right   . Herpes zoster without mention of complication   . Hip pain, chronic 09/17/2014  . History of blood clots    "2 in my aorta"  . History of blood transfusion   . History of bronchitis   . History of echocardiogram    Echo 11/18: mild LVH, EF 60-65, no RWMA, Gr 1 DD, Ao Sclerosis, mild AI, RVH with low nl RSVF, trivial eff  . Hx of radiation therapy 04/28/11 -06/08/11   right upper lobe-lung  . Hyperlipidemia   . Hypertension   . Kidney infection    "related to chemo"  . Lumbago   . Malignant neoplasm of bronchus and lung, unspecified site   . Mental disorder   . Muscle weakness (generalized)   . Non-small cell lung cancer ([place]) 03/31/2011   Adenocarcinoma Rt upper lobe mass  . Osteoarthrosis,  unspecified whether generalized or localized, unspecified site   . Osteoporosis   . Other abnormal blood chemistry   . Other and unspecified hyperlipidemia   . PAD (peripheral artery disease) ([place])   . Pain in thoracic spine   . 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 Primary cancer of right upper lobe of lung ([place]) 02/08/2011   Biopsy Rt upper lobe mass 03/31/11>>adenocarcinoma DIAGNOSIS: Stage IIB/IIIA non-small cell lung cancer consistent with adenocarcinoma diagnosed in December 2012.   PRIOR THERAPY:  1) Status post radiation therapy to the right upper lobe lung mass completed on 06/04/2011 under the care of Dr. Pablo Ledger.  2) Systemic chemotherapy with carboplatin for AUC of 5 and Alimta 500 mg/M2 giving every 3   . Reflux   . Reflux  esophagitis   . Respiratory failure with hypoxia 01/02/2010  . Restless legs syndrome (RLS)   . Scoliosis   . Screening for thyroid disorder   . Shortness of breath    "anytime really"  . Sinus headache    "all the time"  . Spasm of muscle   . Syncope and collapse   . Type II or unspecified type diabetes mellitus with neurological manifestations, uncontrolled(250.62)   . Type II or unspecified type diabetes mellitus without mention of complication, not stated as uncontrolled   . Type II or unspecified type diabetes mellitus without mention of complication, not stated as uncontrolled   . Unspecified constipation   . Unspecified essential hypertension   . Unspecified hearing loss   . Unspecified hereditary and idiopathic peripheral neuropathy   . Unspecified vitamin D deficiency    Past Surgical History:  Procedure Laterality Date  . BRONCHOSCOPY  02/2011  . CARDIAC CATHETERIZATION    . CATARACT EXTRACTION  03/12/2013   Left Eye   . DILATION AND CURETTAGE OF UTERUS  1980's  . EYE SURGERY Left 2014   Cataract  . FETAL BLOOD TRANSFUSION  09/16/11  . JOINT REPLACEMENT  04/13/10   Left Hip  . LUNG LOBECTOMY  1986   left  . SEPTOPLASTY    . TOTAL HIP ARTHROPLASTY  01/2010   left  . TUBAL LIGATION  1980's    reports that she quit smoking about 19 years ago. Her smoking use included cigarettes. She has a 40.00 pack-year smoking history. She has never used smokeless tobacco. She reports that she does not drink alcohol or use drugs. family history includes Arthritis in her daughter; Bone cancer in her father; Cancer in her father; Deep vein thrombosis in her mother; Diabetes in her mother; Heart disease in her mother; Hyperlipidemia in her mother; Hypertension in her mother; Varicose Veins in her mother. Allergies  Allergen Reactions  . Iohexol Shortness Of Breath and Other (See Comments)    "burns me up inside"    Pt does fine with 13 hour prep  01/17/12  . Other     CAN'T STAND THE  SMELL OF PURE BLEACH; "HAVE TO BACK UP AND POUR AND DON'T BREATH IT TIL i GET CAP BACK ON; DILUTE IT TO SPRAY"  . Anoro Ellipta [Umeclidinium-Vilanterol] Swelling    Swelling of lips and mouth  . Codeine Itching and Nausea And Vomiting    REACTION: GI upset  . Protonix [Pantoprazole Sodium] Diarrhea   Current Outpatient Medications on File Prior to Visit  Medication Sig Dispense Refill  . ACCU-CHEK SOFTCLIX LANCETS lancets by Other route. Check blood sugar twice daily. Dx: E11.9, E11.49    . albuterol (PROVENTIL HFA;VENTOLIN HFA) 108 (90  Base) MCG/ACT inhaler Inhale 2 puffs into the lungs every 6 (six) hours as needed for wheezing or shortness of breath. 1 Inhaler 6  . albuterol (PROVENTIL) (2.5 MG/3ML) 0.083% nebulizer solution Take 3 mLs (2.5 mg total) by nebulization every 6 (six) hours as needed for wheezing or shortness of breath. 75 mL 5  . ALPRAZolam (XANAX) 1 MG tablet Take 1 tablet (1 mg total) by mouth 3 (three) times daily as needed. for anxiety 90 tablet 2  . aspirin 81 MG tablet Take 81 mg by mouth daily.    . Blood Glucose Monitoring Suppl (ACCU-CHEK AVIVA PLUS) w/Device KIT Use as directed to check blood sugar Dx: E11.9, E11.49 1 kit 0  . Calcium Carbonate-Vit D-Min (CALTRATE PLUS PO) Take 1 tablet by mouth daily.    . fexofenadine (ALLEGRA) 180 MG tablet Take 180 mg by mouth daily.    . fluticasone (FLONASE) 50 MCG/ACT nasal spray SHAKE LIQUID WELL AND USE 2 SPRAYS IN EACH NOSTRIL TWICE DAILY 16 g 5  . Fluticasone-Umeclidin-Vilant (TRELEGY ELLIPTA) 100-62.5-25 MCG/INH AEPB Inhale 1 puff daily into the lungs. 1 each 6  . folic acid (FOLVITE) 1 MG tablet Take 1 tablet (1 mg total) by mouth daily. 30 tablet 4  . loratadine (CLARITIN) 10 MG tablet TAKE 1 TABLET BY MOUTH EVERY DAY 30 tablet 5  . metFORMIN (GLUCOPHAGE) 500 MG tablet TAKE 1 TABLET BY MOUTH EVERY MORNING AS NEEDED FOR BLOOD SUGAR OVER 160 AND 1 TABLET BY MOUTH EVERY EVENING 60 tablet 1  . OXYGEN Inhale 4 L into the  lungs.    . potassium chloride (K-DUR,KLOR-CON) 10 MEQ tablet Take 1 tablet (10 mEq total) daily by mouth. 90 tablet 3  . potassium chloride (K-DUR,KLOR-CON) 10 MEQ tablet TAKE 1 TABLET BY MOUTH ONCE DAILY 30 tablet 0  . senna (SENOKOT) 8.6 MG tablet Take 1 tablet by mouth daily.    . traMADol (ULTRAM) 50 MG tablet Take 2 tablets (100 mg total) every 6 (six) hours as needed by mouth for severe pain. 90 tablet 1  . UNABLE TO FIND Med Name: Cranial Prothesis for Chemotherapy 1 each 0  . furosemide (LASIX) 40 MG tablet Take 1 tablet (40 mg total) by mouth daily for 3 doses. 3 tablet 0  . [DISCONTINUED] calcium carbonate 200 MG capsule Take 250 mg by mouth daily. Does not take everyday     Current Facility-Administered Medications on File Prior to Visit  Medication Dose Route Frequency Provider Last Rate Last Dose  . hyaluronate sodium (RADIAPLEXRX) gel   Topical Once Thea Silversmith, MD      . sodium chloride flush (NS) 0.9 % injection 10 mL  10 mL Intravenous PRN Curt Bears, MD      . topical emolient (BIAFINE) emulsion   Topical PRN Thea Silversmith, MD       Review of Systems  Constitutional: Negative for other unusual diaphoresis or sweats HENT: Negative for ear discharge or swelling Eyes: Negative for other worsening visual disturbances Respiratory: Negative for stridor or other swelling  Gastrointestinal: Negative for worsening distension or other blood Genitourinary: Negative for retention or other urinary change Musculoskeletal: Negative for other MSK pain or swelling Skin: Negative for color change or other new lesions Neurological: Negative for worsening tremors and other numbness  Psychiatric/Behavioral: Negative for worsening agitation or other fatigue All other system neg per pt    Objective:   Physical Exam BP 126/72   Pulse (!) 124   Temp 98.4 F (36.9 C) (Oral)  Ht _0  (1.651 m)   Wt 176 lb (79.8 kg)   SpO2 98%   BMI 29.29 kg/m    On home o2 4L VS noted,    Constitutional: Pt appears in NAD HENT: Head: NCAT.  Right Ear: External ear normal.  Left Ear: External ear normal.  Eyes: . Pupils are equal, round, and reactive to light. Conjunctivae and EOM are normal Nose: without d/c or deformity Neck: Neck supple. Gross normal ROM Cardiovascular: Normal rate and regular rhythm.   Pulmonary/Chest: Effort normal and breath sounds decreased without rales or wheezing.  Abd:  Soft, NT, ND, + BS, no , states mild right flank tender to palpate Spine nontender throughout Neurological: Pt is alert. At baseline orientation, motor grossly intact Skin: Skin is warm. No rashes, other new lesions, no LE edema Psychiatric: Pt behavior is normal without agitation  No other exam findings    Assessment & Plan:

## 2017-07-04 NOTE — Assessment & Plan Note (Signed)
For med refill when due, but I will not rx more than bid prn,  to f/u any worsening symptoms or concerns

## 2017-07-04 NOTE — Assessment & Plan Note (Signed)
I suspect due to mild low volume not on diuretic, for increased fluids over next several days

## 2017-07-04 NOTE — Assessment & Plan Note (Signed)
stable overall by history and exam, recent data reviewed with pt, and pt to continue medical treatment as before,  to f/u any worsening symptoms or concerns, for f/u labs

## 2017-07-04 NOTE — Patient Instructions (Addendum)
Ok to stop the zantac  Please take all new medication as prescribed - the prilosec  Please drink more fluids in the next few days  OK to take Immodium as needed for diarrhea  Please continue all other medications as before, and refills have been done if requested.  Please have the pharmacy call with any other refills you may need.  Please continue your efforts at being more active, low cholesterol diet, and weight control.  You are otherwise up to date with prevention measures today.  Please keep your appointments with your specialists as you may have planned  Please go to the LAB in the Basement (turn left off the elevator) for the tests to be done today  You will be contacted by phone if any changes need to be made immediately.  Otherwise, you will receive a letter about your results with an explanation, but please check with MyChart first.  Please remember to sign up for MyChart if you have not done so, as this will be important to you in the future with finding out test results, communicating by private email, and scheduling acute appointments online when needed.  Please return in 6 months, or sooner if needed, with Lab testing done 3-5 days before

## 2017-07-04 NOTE — Assessment & Plan Note (Signed)
?   [facility] - for urine studies, consider antibx

## 2017-07-04 NOTE — Assessment & Plan Note (Signed)
Ok to try change zantac to prilosec 20 qd

## 2017-07-05 ENCOUNTER — Telehealth: Payer: Self-pay | Admitting: Internal Medicine

## 2017-07-05 ENCOUNTER — Encounter: Payer: Self-pay | Admitting: Internal Medicine

## 2017-07-05 LAB — URINE CULTURE
MICRO NUMBER:: 90369987
RESULT: NO GROWTH
SPECIMEN QUALITY:: ADEQUATE

## 2017-07-05 MED ORDER — ROSUVASTATIN CALCIUM 10 MG PO TABS: 10.0000 mg | ORAL_TABLET | Freq: Every day | ORAL | 3 refills | Status: DC

## 2017-07-05 NOTE — Telephone Encounter (Signed)
Patient calling because rosuvastatin (CRESTOR) 10 MG tablet is not covered by medicaid and she needs an alternate medicine.

## 2017-07-06 ENCOUNTER — Other Ambulatory Visit: Payer: Self-pay | Admitting: *Deleted

## 2017-07-06 ENCOUNTER — Telehealth: Payer: Self-pay | Admitting: Internal Medicine

## 2017-07-06 DIAGNOSIS — C3411 Malignant neoplasm of upper lobe, right bronchus or lung: Secondary | ICD-10-CM

## 2017-07-06 MED ORDER — ATORVASTATIN CALCIUM 20 MG PO TABS: 20.0000 mg | ORAL_TABLET | Freq: Every day | ORAL | 3 refills | Status: DC

## 2017-07-06 NOTE — Telephone Encounter (Signed)
Patient is requesting an alternative medication- she states Crestor is not covered by insurance

## 2017-07-06 NOTE — Telephone Encounter (Signed)
Patient advised new rx sent to pharm

## 2017-07-06 NOTE — Telephone Encounter (Signed)
Copied from [place]. Topic: Quick Communication - See Telephone Encounter >> Jul 06, 2017 11:14 AM Boyd Kerbs wrote: CRM for notification.   Doctor called crestor in for patient and medicare won't pay.  [facility] said Lipator should be covered.  Asking to have doctor do a prescription for Lipator  Son is very concerned and is 43 minutes away and she has someone (aide) comes at 1pm today 3/27 and wants to have her pick up prescription  [place] [place] [address] Phone: [phone] Fax: [phone]  Please call pt. And let her know.  Son, wants High priority to have done asap    See Telephone encounter for: 07/06/17.

## 2017-07-06 NOTE — Telephone Encounter (Signed)
Pt aware RX is at pharmacy.

## 2017-07-06 NOTE — Telephone Encounter (Signed)
Routing to dr john, please advise, thanks 

## 2017-07-06 NOTE — Addendum Note (Signed)
Addended by: [name] on: 07/06/2017 01:06 PM   Modules accepted: Orders

## 2017-07-06 NOTE — Telephone Encounter (Signed)
[place] for lipitor instead - done erx

## 2017-07-07 ENCOUNTER — Encounter: Payer: Self-pay | Admitting: Internal Medicine

## 2017-07-07 ENCOUNTER — Inpatient Hospital Stay: Payer: Medicare Other

## 2017-07-07 ENCOUNTER — Inpatient Hospital Stay (HOSPITAL_BASED_OUTPATIENT_CLINIC_OR_DEPARTMENT_OTHER): Payer: Medicare Other | Admitting: Internal Medicine

## 2017-07-07 ENCOUNTER — Telehealth: Payer: Self-pay | Admitting: Internal Medicine

## 2017-07-07 ENCOUNTER — Telehealth: Payer: Self-pay | Admitting: Emergency Medicine

## 2017-07-07 VITALS — BP 134/54 | HR 93 | Temp 98.7°F | Resp 18 | Ht 65.0 in | Wt 176.3 lb

## 2017-07-07 DIAGNOSIS — C3411 Malignant neoplasm of upper lobe, right bronchus or lung: Secondary | ICD-10-CM

## 2017-07-07 DIAGNOSIS — J439 Emphysema, unspecified: Secondary | ICD-10-CM | POA: Diagnosis not present

## 2017-07-07 DIAGNOSIS — Z5112 Encounter for antineoplastic immunotherapy: Secondary | ICD-10-CM | POA: Diagnosis not present

## 2017-07-07 DIAGNOSIS — R509 Fever, unspecified: Secondary | ICD-10-CM | POA: Diagnosis not present

## 2017-07-07 DIAGNOSIS — R05 Cough: Secondary | ICD-10-CM | POA: Diagnosis not present

## 2017-07-07 DIAGNOSIS — R0602 Shortness of breath: Secondary | ICD-10-CM | POA: Diagnosis not present

## 2017-07-07 LAB — CMP (CANCER CENTER ONLY)
ALT: 12 U/L (ref 0–55)
ANION GAP: 11 (ref 3–11)
AST: 14 U/L (ref 5–34)
Albumin: 3.5 g/dL (ref 3.5–5.0)
Alkaline Phosphatase: 86 U/L (ref 40–150)
BILIRUBIN TOTAL: 0.5 mg/dL (ref 0.2–1.2)
BUN: 15 mg/dL (ref 7–26)
CALCIUM: 9.2 mg/dL (ref 8.4–10.4)
CO2: 29 mmol/L (ref 22–29)
CREATININE: 0.96 mg/dL (ref 0.60–1.10)
Chloride: 100 mmol/L (ref 98–109)
GFR, Estimated: 57 mL/min — ABNORMAL LOW (ref >60)
GLUCOSE: 100 mg/dL (ref 70–140)
Potassium: 3.1 mmol/L — ABNORMAL LOW (ref 3.5–5.1)
Sodium: 140 mmol/L (ref 136–145)
TOTAL PROTEIN: 6.3 g/dL — AB (ref 6.4–8.3)

## 2017-07-07 LAB — CBC WITH DIFFERENTIAL (CANCER CENTER ONLY)
BASOS ABS: 0.1 10*3/uL (ref 0.0–0.1)
BASOS PCT: 1 %
EOS ABS: 0.3 10*3/uL (ref 0.0–0.5)
Eosinophils Relative: 4 %
HEMATOCRIT: 37.4 % (ref 34.8–46.6)
Hemoglobin: 12.5 g/dL (ref 11.6–15.9)
Lymphocytes Relative: 22 %
Lymphs Abs: 1.7 10*3/uL (ref 0.9–3.3)
MCH: 30.2 pg (ref 25.1–34.0)
MCHC: 33.3 g/dL (ref 31.5–36.0)
MCV: 90.8 fL (ref 79.5–101.0)
MONO ABS: 0.6 10*3/uL (ref 0.1–0.9)
Monocytes Relative: 8 %
NEUTROS ABS: 4.9 10*3/uL (ref 1.5–6.5)
Neutrophils Relative %: 65 %
PLATELETS: 234 10*3/uL (ref 145–400)
RBC: 4.12 MIL/uL (ref 3.70–5.45)
RDW: 13.8 % (ref 11.2–14.5)
WBC Count: 7.5 10*3/uL (ref 3.9–10.3)

## 2017-07-07 MED ORDER — SODIUM CHLORIDE 0.9 % IV SOLN
240.0000 mg | Freq: Once | INTRAVENOUS | Status: AC
  Administered 2017-07-07: 240 mg via INTRAVENOUS
  Filled 2017-07-07: qty 24

## 2017-07-07 MED ORDER — SODIUM CHLORIDE 0.9 % IV SOLN
Freq: Once | INTRAVENOUS | Status: AC
  Administered 2017-07-07: 13:00:00 via INTRAVENOUS

## 2017-07-07 NOTE — Progress Notes (Signed)
[name] [phone]   [phone]  OFFICE PROGRESS NOTE  [name], MD [place] [address]  DIAGNOSIS: Recurrent non-small cell lung cancer initially diagnosed as a stage IIA adenocarcinoma in December 2012 with no actionable mutations.   PRIOR THERAPY: 1) Status post radiation therapy to the right upper lobe lung mass completed on 06/04/2011 under the care of Dr. Pablo Ledger.  2) Systemic chemotherapy with carboplatin for AUC of 5 and Alimta 500 mg/M2 giving every 3 weeks, status post 3 cycles, last dose was given on 07/21/2011.  3) Systemic chemotherapy with carboplatin for AUC of 5 and Alimta 500 MG/M2 every 3 weeks. First dose on 01/02/2015. Status post 2 cycles, last dose was given 01/30/2015. Her treatment is currently on hold secondary to intolerance. 4) Immunotherapy with Nivolumab 240 M IV every 2 weeks status post 14 cycles.  5)immunotherapy with Nivolumab 480 mg IV every 4 weeks. Patient is status post 1 cycle. The patient discontinued treatment with Nivolumab every 4 weeks because she want to switch back to every 2 weeks due to increased side effects with the monthly dosing.  CURRENT THERAPY: The patient will resume her treatment again with immunotherapy with Nivolumab 240 mg IV every 2 weeks. First dose 09/15/2016. Status post 7 cycles  INTERVAL HISTORY: [name] 72 y.o. female returns to the clinic today for follow-up visit.  The patient mentioned that she is feeling much better.  She was seen by her primary care physician recently and was a started on Lipitor for dyslipidemia.  She claims that she felt much better after the first dose of Lipitor and has more energy.  She denied having any current chest pain but continues to have the baseline shortness of breath and she is on home oxygen.  She denied having any fever or chills.  She has no nausea, vomiting, diarrhea or constipation.  She is here today for  reevaluation before resuming her treatment with Nivolumab.   MEDICAL HISTORY: Past Medical History:  Diagnosis Date  . Adenocarcinoma, lung ([place])    upper left lobe  . Anemia in neoplastic disease   . Anxiety   . Arthritis    "all over my body"  . Asthma   . Asymptomatic varicose veins   . Atherosclerosis of native arteries of the extremities, unspecified   . Atrial fibrillation ([place])   . Bronchial pneumonia    history  . 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 Chemotherapy adverse reaction 09/15/11   "makes me light headed and pass out; this is 3rd blood transfusion since going thru it"  . CHF (congestive heart failure) ([place])   . Chronic airway obstruction, not elsewhere classified   . Chronic fatigue 08/19/2015  . Chronic sinusitis   . Complication of anesthesia    slow to wake up  . COPD with emphysema ([place])   . Coronary artery disease    Dr. Burt Knack had stress test done  . DDD (degenerative disc disease), lumbar   . Depressive disorder, not elsewhere classified   . Diseases of lips   . Disorder of bone and cartilage, unspecified   . DVT (deep venous thrombosis) ([place])   . Fibromyalgia   . Functional urinary incontinence   . GERD (gastroesophageal reflux disease)   . Hard of hearing, right   . Herpes zoster without mention of complication   . Hip pain, chronic 09/17/2014  . History of  blood clots    "2 in my aorta"  . History of blood transfusion   . History of bronchitis   . History of echocardiogram    Echo 11/18: mild LVH, EF 60-65, no RWMA, Gr 1 DD, Ao Sclerosis, mild AI, RVH with low nl RSVF, trivial eff  . Hx of radiation therapy 04/28/11 -06/08/11   right upper lobe-lung  . Hyperlipidemia   . Hypertension   . Kidney infection    "related to chemo"  . Lumbago   . Malignant neoplasm of bronchus and lung, unspecified site   . Mental disorder   . Muscle weakness (generalized)   . Non-small cell lung cancer ([place]) 03/31/2011   Adenocarcinoma Rt upper lobe mass    . Osteoarthrosis, unspecified whether generalized or localized, unspecified site   . Osteoporosis   . Other abnormal blood chemistry   . Other and unspecified hyperlipidemia   . PAD (peripheral artery disease) ([place])   . Pain in thoracic spine   . 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 Primary cancer of right upper lobe of lung ([place]) 02/08/2011   Biopsy Rt upper lobe mass 03/31/11>>adenocarcinoma DIAGNOSIS: Stage IIB/IIIA non-small cell lung cancer consistent with adenocarcinoma diagnosed in December 2012.   PRIOR THERAPY:  1) Status post radiation therapy to the right upper lobe lung mass completed on 06/04/2011 under the care of Dr. Pablo Ledger.  2) Systemic chemotherapy with carboplatin for AUC of 5 and Alimta 500 mg/M2 giving every 3   . Reflux   . Reflux esophagitis   . Respiratory failure with hypoxia 01/02/2010  . Restless legs syndrome (RLS)   . Scoliosis   . Screening for thyroid disorder   . Shortness of breath    "anytime really"  . Sinus headache    "all the time"  . Spasm of muscle   . Syncope and collapse   . Type II or unspecified type diabetes mellitus with neurological manifestations, uncontrolled(250.62)   . Type II or unspecified type diabetes mellitus without mention of complication, not stated as uncontrolled   . Type II or unspecified type diabetes mellitus without mention of complication, not stated as uncontrolled   . Unspecified constipation   . Unspecified essential hypertension   . Unspecified hearing loss   . Unspecified hereditary and idiopathic peripheral neuropathy   . Unspecified vitamin D deficiency     ALLERGIES:  is allergic to iohexol; other; anoro ellipta [umeclidinium-vilanterol]; codeine; and protonix [pantoprazole sodium].  MEDICATIONS:  Current Outpatient Medications  Medication Sig Dispense Refill  . ACCU-CHEK SOFTCLIX LANCETS lancets by Other route. Check blood sugar twice daily. Dx: E11.9, E11.49    . albuterol (PROVENTIL HFA;VENTOLIN  HFA) 108 (90 Base) MCG/ACT inhaler Inhale 2 puffs into the lungs every 6 (six) hours as needed for wheezing or shortness of breath. 1 Inhaler 6  . albuterol (PROVENTIL) (2.5 MG/3ML) 0.083% nebulizer solution Take 3 mLs (2.5 mg total) by nebulization every 6 (six) hours as needed for wheezing or shortness of breath. 75 mL 5  . ALPRAZolam (XANAX) 1 MG tablet Take 1 tablet (1 mg total) by mouth 3 (three) times daily as needed. for anxiety 90 tablet 2  . aspirin 81 MG tablet Take 81 mg by mouth daily.    Marland Kitchen atorvastatin (LIPITOR) 20 MG tablet Take 1 tablet (20 mg total) by mouth daily. 90 tablet 3  . Blood Glucose Monitoring Suppl (ACCU-CHEK AVIVA PLUS) w/Device KIT Use as directed to check blood sugar Dx: E11.9, E11.49 1 kit 0  .  Calcium Carbonate-Vit D-Min (CALTRATE PLUS PO) Take 1 tablet by mouth daily.    . fexofenadine (ALLEGRA) 180 MG tablet Take 180 mg by mouth daily.    . fluticasone (FLONASE) 50 MCG/ACT nasal spray SHAKE LIQUID WELL AND USE 2 SPRAYS IN EACH NOSTRIL TWICE DAILY 16 g 5  . Fluticasone-Umeclidin-Vilant (TRELEGY ELLIPTA) 100-62.5-25 MCG/INH AEPB Inhale 1 puff daily into the lungs. 1 each 6  . folic acid (FOLVITE) 1 MG tablet Take 1 tablet (1 mg total) by mouth daily. 30 tablet 4  . furosemide (LASIX) 40 MG tablet Take 1 tablet (40 mg total) by mouth daily for 3 doses. 3 tablet 0  . HYDROcodone-acetaminophen (NORCO) 10-325 MG tablet Take 1 tablet by mouth 2 (two) times daily as needed. 90 tablet 0  . loratadine (CLARITIN) 10 MG tablet TAKE 1 TABLET BY MOUTH EVERY DAY 30 tablet 5  . metFORMIN (GLUCOPHAGE) 500 MG tablet TAKE 1 TABLET BY MOUTH EVERY MORNING AS NEEDED FOR BLOOD SUGAR OVER 160 AND 1 TABLET BY MOUTH EVERY EVENING 60 tablet 1  . omeprazole (PRILOSEC) 20 MG capsule Take 1 capsule (20 mg total) by mouth daily. 90 capsule 3  . OXYGEN Inhale 4 L into the lungs.    . potassium chloride (K-DUR,KLOR-CON) 10 MEQ tablet Take 1 tablet (10 mEq total) daily by mouth. 90 tablet 3  .  potassium chloride (K-DUR,KLOR-CON) 10 MEQ tablet TAKE 1 TABLET BY MOUTH ONCE DAILY 30 tablet 0  . senna (SENOKOT) 8.6 MG tablet Take 1 tablet by mouth daily.    . traMADol (ULTRAM) 50 MG tablet Take 2 tablets (100 mg total) every 6 (six) hours as needed by mouth for severe pain. 90 tablet 1  . UNABLE TO FIND Med Name: Cranial Prothesis for Chemotherapy 1 each 0   No current facility-administered medications for this visit.    Facility-Administered Medications Ordered in Other Visits  Medication Dose Route Frequency Provider Last Rate Last Dose  . hyaluronate sodium (RADIAPLEXRX) gel   Topical Once Thea Silversmith, MD      . sodium chloride flush (NS) 0.9 % injection 10 mL  10 mL Intravenous PRN Curt Bears, MD      . topical emolient (BIAFINE) emulsion   Topical PRN Thea Silversmith, MD        SURGICAL HISTORY:  Past Surgical History:  Procedure Laterality Date  . BRONCHOSCOPY  02/2011  . CARDIAC CATHETERIZATION    . CATARACT EXTRACTION  03/12/2013   Left Eye   . DILATION AND CURETTAGE OF UTERUS  1980's  . EYE SURGERY Left 2014   Cataract  . FETAL BLOOD TRANSFUSION  09/16/11  . JOINT REPLACEMENT  04/13/10   Left Hip  . LUNG LOBECTOMY  1986   left  . SEPTOPLASTY    . TOTAL HIP ARTHROPLASTY  01/2010   left  . TUBAL LIGATION  1980's    REVIEW OF SYSTEMS:  A comprehensive review of systems was negative except for: Constitutional: positive for fatigue Respiratory: positive for dyspnea on exertion   PHYSICAL EXAMINATION: General appearance: alert, cooperative, fatigued and mild distress Head: Normocephalic, without obvious abnormality, atraumatic Neck: no adenopathy, no JVD, supple, symmetrical, trachea midline and thyroid not enlarged, symmetric, no tenderness/mass/nodules Lymph nodes: Cervical, supraclavicular, and axillary nodes normal. Resp: wheezes bilaterally Back: symmetric, no curvature. ROM normal. No CVA tenderness. Cardio: regular rate and rhythm, S1, S2 normal, no  murmur, click, rub or gallop GI: soft, non-tender; bowel sounds normal; no masses,  no organomegaly Extremities: extremities normal,  atraumatic, no cyanosis or edema  ECOG PERFORMANCE STATUS: 1 - Symptomatic but completely ambulatory  Blood pressure (!) 134/54, pulse 93, temperature 98.7 F (37.1 C), temperature source Oral, resp. rate 18, height 5' 5"  (1.651 m), weight 176 lb 4.8 oz (80 kg), SpO2 99 %.  LABORATORY DATA: Lab Results  Component Value Date   WBC 7.5 07/07/2017   HGB 12.9 06/16/2017   HCT 37.4 07/07/2017   MCV 90.8 07/07/2017   PLT 234 07/07/2017      Chemistry      Component Value Date/Time   NA 139 07/04/2017 1645   NA 141 04/07/2017 1016   K 3.4 (L) 07/04/2017 1645   K 3.4 (L) 04/07/2017 1016   CL 98 07/04/2017 1645   CL 99 07/21/2012 1125   CO2 33 (H) 07/04/2017 1645   CO2 27 04/07/2017 1016   BUN 15 07/04/2017 1645   BUN 13.8 04/07/2017 1016   CREATININE 0.98 07/04/2017 1645   CREATININE 1.07 06/24/2017 1028   CREATININE 1.1 04/07/2017 1016      Component Value Date/Time   CALCIUM 9.4 07/04/2017 1645   CALCIUM 9.5 04/07/2017 1016   ALKPHOS 88 06/24/2017 1028   ALKPHOS 94 04/07/2017 1016   AST 13 06/24/2017 1028   AST 13 04/07/2017 1016   ALT 14 06/24/2017 1028   ALT 8 04/07/2017 1016   BILITOT 0.4 06/24/2017 1028   BILITOT 0.36 04/07/2017 1016       RADIOGRAPHIC STUDIES: Dg Chest 2 View  Result Date: 06/09/2017 CLINICAL DATA:  Cough, congestion, shortness of breath, low-grade fever EXAM: CHEST  2 VIEW COMPARISON:  01/03/2017 FINDINGS: Postoperative changes on the left. Persistent density noted in the inferior right upper lobe. This is similar to prior study. Heart is normal size. No confluent opacity on the left. No effusions or acute bony abnormality. Old right rib fractures noted. IMPRESSION: Persistent bandlike airspace density in the inferior right upper lobe, not significantly changed since prior study, presumably post treatment related.  Postoperative changes on the left. No definite acute process. Electronically Signed   By: Rolm Baptise M.D.   On: 06/09/2017 19:34   Ct Chest Wo Contrast  Result Date: 06/23/2017 CLINICAL DATA:  Non-small-cell lung cancer. Originally diagnosed in 2012. Currently on immunotherapy. EXAM: CT CHEST WITHOUT CONTRAST TECHNIQUE: Multidetector CT imaging of the chest was performed following the standard protocol without IV contrast. COMPARISON:  03/24/2017 FINDINGS: Cardiovascular: Aortic and branch vessel atherosclerosis. Normal heart size, without pericardial effusion. Multivessel coronary artery atherosclerosis. Mediastinum/Nodes: No supraclavicular adenopathy. No well-defined mediastinal adenopathy. Hilar regions poorly evaluated without intravenous contrast. Lungs/Pleura: Small right-sided pleural effusion is decreased. 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 Right upper lobe endobronchial compression/narrowing is similar. Left upper lobectomy. Central right upper lobe nodular density measures 1.4 x 1.3 cm on image 62/7 versus similar on the prior exam (when remeasured). The 8 mm ground-glass nodule and adjacent 2 mm soft tissue nodule in the right lower lobe are felt to be similar, including on 94/7. Minimal nodularity along the right major fissure is felt to be new on image 78/7. Anteromedial right lower lobe band like consolidation could be treatment related and is similar. Minimal improvement in right upper lobe aeration with ground-glass and airspace disease remaining. Re-demonstration of collapse/consolidation in the posteromedial right upper lobe. Posterior left lower lobe atelectasis or scar. more anterior left lower lobe ground-glass opacity is similar, including on image 60/7. Upper Abdomen: Normal imaged portions of the liver, spleen, stomach, pancreas, gallbladder, left kidney.  Bilateral low-density adrenal nodularity is not significantly changed, favoring adenomas. Musculoskeletal: Mild osteopenia.  Posttraumatic or postsurgical defects about the right chest wall/ribs. Status post T4 vertebral augmentation with similar mild to moderate T5 superior endplate compression deformity. IMPRESSION: 1. Slight improvement in right-sided aeration. Residual right upper lobe nodular density, not significantly changed. Areas of right upper lobe ground-glass and consolidation persist. 2. Aortic atherosclerosis (ICD10-I70.0), coronary artery atherosclerosis and emphysema (ICD10-J43.9). 3. Slight decrease in right pleural effusion. 4. Similar left lower lobe ground-glass opacity. Electronically Signed   By: Abigail Miyamoto M.D.   On: 06/23/2017 10:00    ASSESSMENT AND PLAN:  This is a very pleasant 71 years old white female with recurrent non-small cell lung cancer, adenocarcinoma that was initially diagnosed as unresectable stage IIA in December 2012 status post systemic chemotherapy as well as immunotherapy.  She is currently undergoing treatment with immunotherapy with Nivolumab 240 mg IV every 2 weeks.  She is status post 7 cycles of the new regimen. Her last scan showed no evidence for disease progression.  The patient is feeling much better today.  I recommended for her to proceed with cycle #8 today as a scheduled. She will come back for follow-up visit in 2 weeks for evaluation before cycle #9. The patient was advised to call immediately if she has any concerning symptoms in the interval. The patient voices understanding of current disease status and treatment options and is in agreement with the current care plan. All questions were answered. The patient knows to call the clinic with any problems, questions or concerns. We can certainly see the patient much sooner if necessary.  Disclaimer: This note was dictated with voice recognition software. Similar sounding words can inadvertently be transcribed and may not be corrected upon review.

## 2017-07-07 NOTE — Patient Instructions (Signed)
[facility] Discharge Instructions for Patients Receiving Chemotherapy  Today you received the following chemotherapy agents: Nivolumab (Opdivo)  To help prevent nausea and vomiting after your treatment, we encourage you to take your nausea medication as prescribed.    If you develop nausea and vomiting that is not controlled by your nausea medication, call the clinic.   BELOW ARE SYMPTOMS THAT SHOULD BE REPORTED IMMEDIATELY:  *FEVER GREATER THAN 100.5 F  *CHILLS WITH OR WITHOUT FEVER  NAUSEA AND VOMITING THAT IS NOT CONTROLLED WITH YOUR NAUSEA MEDICATION  *UNUSUAL SHORTNESS OF BREATH  *UNUSUAL BRUISING OR BLEEDING  TENDERNESS IN MOUTH AND THROAT WITH OR WITHOUT PRESENCE OF ULCERS  *URINARY PROBLEMS  *BOWEL PROBLEMS  UNUSUAL RASH Items with * indicate a potential emergency and should be followed up as soon as possible.  Feel free to call the clinic should you have any questions or concerns. The clinic phone number is [phone].  Please show the CHEMO ALERT CARD at check-in to the Emergency Department and triage nurse.   

## 2017-07-07 NOTE — Telephone Encounter (Signed)
Scheduled appt per 3/28 los - Gave patient AVS and calender per los.

## 2017-07-07 NOTE — Telephone Encounter (Signed)
Called patient unable to reach left message to give us a call back.

## 2017-07-08 NOTE — Telephone Encounter (Signed)
Spoke with patient. She stated that [employer] switched her tanks yesterday to smaller ones. She is able to use these better compared to the old ones that she had. She stated that [employer] will be sending over paperwork regarding the new tanks.    She also was confused about her appt with RB on 07/12/17. She stated that she was just seen by RB last week and was advised to follow up in 4 months. Advised patient that if she felt like she did not need to be seen, I would cancel her appt. She wished to have this appt cancelled therefore it has.   Nothing else needed at time of call.

## 2017-07-12 ENCOUNTER — Ambulatory Visit: Payer: Medicare Other | Admitting: Emergency Medicine

## 2017-07-14 ENCOUNTER — Other Ambulatory Visit: Payer: Medicare Other

## 2017-07-14 ENCOUNTER — Ambulatory Visit: Payer: Medicare Other | Admitting: Oncology

## 2017-07-14 ENCOUNTER — Ambulatory Visit: Payer: Medicare Other

## 2017-07-21 ENCOUNTER — Inpatient Hospital Stay (HOSPITAL_BASED_OUTPATIENT_CLINIC_OR_DEPARTMENT_OTHER): Payer: Medicare Other | Admitting: Internal Medicine

## 2017-07-21 ENCOUNTER — Encounter: Payer: Self-pay | Admitting: Internal Medicine

## 2017-07-21 ENCOUNTER — Inpatient Hospital Stay: Payer: Medicare Other

## 2017-07-21 ENCOUNTER — Inpatient Hospital Stay: Payer: Medicare Other | Attending: Medical

## 2017-07-21 ENCOUNTER — Telehealth: Payer: Self-pay

## 2017-07-21 VITALS — HR 105

## 2017-07-21 VITALS — BP 127/49 | HR 108 | Temp 99.1°F | Resp 17 | Ht 65.0 in | Wt 178.5 lb

## 2017-07-21 DIAGNOSIS — Z923 Personal history of irradiation: Secondary | ICD-10-CM | POA: Diagnosis not present

## 2017-07-21 DIAGNOSIS — C3411 Malignant neoplasm of upper lobe, right bronchus or lung: Secondary | ICD-10-CM | POA: Diagnosis not present

## 2017-07-21 DIAGNOSIS — Z5112 Encounter for antineoplastic immunotherapy: Secondary | ICD-10-CM

## 2017-07-21 DIAGNOSIS — Z9981 Dependence on supplemental oxygen: Secondary | ICD-10-CM | POA: Diagnosis not present

## 2017-07-21 DIAGNOSIS — R5383 Other fatigue: Secondary | ICD-10-CM | POA: Diagnosis not present

## 2017-07-21 DIAGNOSIS — R05 Cough: Secondary | ICD-10-CM

## 2017-07-21 LAB — CBC WITH DIFFERENTIAL/PLATELET
BASOS ABS: 0.1 10*3/uL (ref 0.0–0.1)
Basophils Relative: 1 %
EOS ABS: 0.8 10*3/uL — AB (ref 0.0–0.5)
Eosinophils Relative: 7 %
HCT: 36.3 % (ref 34.8–46.6)
Hemoglobin: 12.2 g/dL (ref 11.6–15.9)
LYMPHS ABS: 1.5 10*3/uL (ref 0.9–3.3)
LYMPHS PCT: 13 %
MCH: 30.2 pg (ref 25.1–34.0)
MCHC: 33.7 g/dL (ref 31.5–36.0)
MCV: 89.7 fL (ref 79.5–101.0)
Monocytes Absolute: 0.8 10*3/uL (ref 0.1–0.9)
Monocytes Relative: 7 %
NEUTROS PCT: 72 %
Neutro Abs: 7.9 10*3/uL — ABNORMAL HIGH (ref 1.5–6.5)
Platelets: 278 10*3/uL (ref 145–400)
RBC: 4.05 MIL/uL (ref 3.70–5.45)
RDW: 13.2 % (ref 11.2–14.5)
WBC: 11 10*3/uL — AB (ref 3.9–10.3)

## 2017-07-21 LAB — COMPREHENSIVE METABOLIC PANEL
ALT: 11 U/L (ref 0–55)
AST: 16 U/L (ref 5–34)
Albumin: 3.2 g/dL — ABNORMAL LOW (ref 3.5–5.0)
Alkaline Phosphatase: 83 U/L (ref 40–150)
Anion gap: 11 (ref 3–11)
BUN: 8 mg/dL (ref 7–26)
CHLORIDE: 101 mmol/L (ref 98–109)
CO2: 29 mmol/L (ref 22–29)
CREATININE: 0.9 mg/dL (ref 0.60–1.10)
Calcium: 9.6 mg/dL (ref 8.4–10.4)
Glucose, Bld: 108 mg/dL (ref 70–140)
POTASSIUM: 3.2 mmol/L — AB (ref 3.5–5.1)
Sodium: 141 mmol/L (ref 136–145)
Total Bilirubin: 0.4 mg/dL (ref 0.2–1.2)
Total Protein: 6.2 g/dL — ABNORMAL LOW (ref 6.4–8.3)

## 2017-07-21 MED ORDER — SODIUM CHLORIDE 0.9 % IV SOLN
Freq: Once | INTRAVENOUS | Status: AC
  Administered 2017-07-21: 11:00:00 via INTRAVENOUS

## 2017-07-21 MED ORDER — SODIUM CHLORIDE 0.9 % IV SOLN
240.0000 mg | Freq: Once | INTRAVENOUS | Status: AC
  Administered 2017-07-21: 240 mg via INTRAVENOUS
  Filled 2017-07-21: qty 24

## 2017-07-21 NOTE — Patient Instructions (Signed)
[facility] Discharge Instructions for Patients Receiving Chemotherapy  Today you received the following chemotherapy agents: Nivolumab (Opdivo)  To help prevent nausea and vomiting after your treatment, we encourage you to take your nausea medication as prescribed.    If you develop nausea and vomiting that is not controlled by your nausea medication, call the clinic.   BELOW ARE SYMPTOMS THAT SHOULD BE REPORTED IMMEDIATELY:  *FEVER GREATER THAN 100.5 F  *CHILLS WITH OR WITHOUT FEVER  NAUSEA AND VOMITING THAT IS NOT CONTROLLED WITH YOUR NAUSEA MEDICATION  *UNUSUAL SHORTNESS OF BREATH  *UNUSUAL BRUISING OR BLEEDING  TENDERNESS IN MOUTH AND THROAT WITH OR WITHOUT PRESENCE OF ULCERS  *URINARY PROBLEMS  *BOWEL PROBLEMS  UNUSUAL RASH Items with * indicate a potential emergency and should be followed up as soon as possible.  Feel free to call the clinic should you have any questions or concerns. The clinic phone number is [phone].  Please show the CHEMO ALERT CARD at check-in to the Emergency Department and triage nurse.   

## 2017-07-21 NOTE — Progress Notes (Signed)
[place] [phone]   [phone]  OFFICE PROGRESS NOTE  [name], MD [name] [address]  DIAGNOSIS: Recurrent non-small cell lung cancer initially diagnosed as a stage IIA adenocarcinoma in December 2012 with no actionable mutations.   PRIOR THERAPY: 1) Status post radiation therapy to the right upper lobe lung mass completed on 06/04/2011 under the care of Dr. Pablo Ledger.  2) Systemic chemotherapy with carboplatin for AUC of 5 and Alimta 500 mg/M2 giving every 3 weeks, status post 3 cycles, last dose was given on 07/21/2011.  3) Systemic chemotherapy with carboplatin for AUC of 5 and Alimta 500 MG/M2 every 3 weeks. First dose on 01/02/2015. Status post 2 cycles, last dose was given 01/30/2015. Her treatment is currently on hold secondary to intolerance. 4) Immunotherapy with Nivolumab 240 M IV every 2 weeks status post 14 cycles.  5)immunotherapy with Nivolumab 480 mg IV every 4 weeks. Patient is status post 1 cycle. The patient discontinued treatment with Nivolumab every 4 weeks because she want to switch back to every 2 weeks due to increased side effects with the monthly dosing.  CURRENT THERAPY: The patient will resume her treatment again with immunotherapy with Nivolumab 240 mg IV every 2 weeks. First dose 09/15/2016. Status post 8 cycles  INTERVAL HISTORY: [name] 72 y.o. female returns to the clinic today for follow-up visit.  The patient is feeling fine except for fatigue.  She was recovering from cold symptoms.  She denied having any chest pain but continues to have shortness of breath and she is currently on home oxygen.  She also has cough and using Mucinex for her cough.  She denied having any hemoptysis.  The patient continues to have fatigue.  She denied having any fever or chills she has no nausea, vomiting, diarrhea or constipation.  She tolerated the last cycle of her treatment well except for  lack of taste.  She is here today for evaluation before starting cycle #9.   MEDICAL HISTORY: Past Medical History:  Diagnosis Date  . Adenocarcinoma, lung ([place])    upper left lobe  . Anemia in neoplastic disease   . Anxiety   . Arthritis    "all over my body"  . Asthma   . Asymptomatic varicose veins   . Atherosclerosis of native arteries of the extremities, unspecified   . Atrial fibrillation ([place])   . Bronchial pneumonia    history  . 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 Chemotherapy adverse reaction 09/15/11   "makes me light headed and pass out; this is 3rd blood transfusion since going thru it"  . CHF (congestive heart failure) ([place])   . Chronic airway obstruction, not elsewhere classified   . Chronic fatigue 08/19/2015  . Chronic sinusitis   . Complication of anesthesia    slow to wake up  . COPD with emphysema ([place])   . Coronary artery disease    Dr. Burt Knack had stress test done  . DDD (degenerative disc disease), lumbar   . Depressive disorder, not elsewhere classified   . Diseases of lips   . Disorder of bone and cartilage, unspecified   . DVT (deep venous thrombosis) ([place])   . Fibromyalgia   . Functional urinary incontinence   . GERD (gastroesophageal reflux disease)   . Hard of hearing, right   . Herpes zoster without mention of complication   . Hip pain, chronic 09/17/2014  .  History of blood clots    "2 in my aorta"  . History of blood transfusion   . History of bronchitis   . History of echocardiogram    Echo 11/18: mild LVH, EF 60-65, no RWMA, Gr 1 DD, Ao Sclerosis, mild AI, RVH with low nl RSVF, trivial eff  . Hx of radiation therapy 04/28/11 -06/08/11   right upper lobe-lung  . Hyperlipidemia   . Hypertension   . Kidney infection    "related to chemo"  . Lumbago   . Malignant neoplasm of bronchus and lung, unspecified site   . Mental disorder   . Muscle weakness (generalized)   . Non-small cell lung cancer ([place]) 03/31/2011   Adenocarcinoma Rt  upper lobe mass  . Osteoarthrosis, unspecified whether generalized or localized, unspecified site   . Osteoporosis   . Other abnormal blood chemistry   . Other and unspecified hyperlipidemia   . PAD (peripheral artery disease) ([place])   . Pain in thoracic spine   . 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 Primary cancer of right upper lobe of lung ([place]) 02/08/2011   Biopsy Rt upper lobe mass 03/31/11>>adenocarcinoma DIAGNOSIS: Stage IIB/IIIA non-small cell lung cancer consistent with adenocarcinoma diagnosed in December 2012.   PRIOR THERAPY:  1) Status post radiation therapy to the right upper lobe lung mass completed on 06/04/2011 under the care of Dr. Pablo Ledger.  2) Systemic chemotherapy with carboplatin for AUC of 5 and Alimta 500 mg/M2 giving every 3   . Reflux   . Reflux esophagitis   . Respiratory failure with hypoxia 01/02/2010  . Restless legs syndrome (RLS)   . Scoliosis   . Screening for thyroid disorder   . Shortness of breath    "anytime really"  . Sinus headache    "all the time"  . Spasm of muscle   . Syncope and collapse   . Type II or unspecified type diabetes mellitus with neurological manifestations, uncontrolled(250.62)   . Type II or unspecified type diabetes mellitus without mention of complication, not stated as uncontrolled   . Type II or unspecified type diabetes mellitus without mention of complication, not stated as uncontrolled   . Unspecified constipation   . Unspecified essential hypertension   . Unspecified hearing loss   . Unspecified hereditary and idiopathic peripheral neuropathy   . Unspecified vitamin D deficiency     ALLERGIES:  is allergic to iohexol; other; anoro ellipta [umeclidinium-vilanterol]; codeine; and protonix [pantoprazole sodium].  MEDICATIONS:  Current Outpatient Medications  Medication Sig Dispense Refill  . ACCU-CHEK SOFTCLIX LANCETS lancets by Other route. Check blood sugar twice daily. Dx: E11.9, E11.49    . albuterol  (PROVENTIL HFA;VENTOLIN HFA) 108 (90 Base) MCG/ACT inhaler Inhale 2 puffs into the lungs every 6 (six) hours as needed for wheezing or shortness of breath. 1 Inhaler 6  . albuterol (PROVENTIL) (2.5 MG/3ML) 0.083% nebulizer solution Take 3 mLs (2.5 mg total) by nebulization every 6 (six) hours as needed for wheezing or shortness of breath. 75 mL 5  . ALPRAZolam (XANAX) 1 MG tablet Take 1 tablet (1 mg total) by mouth 3 (three) times daily as needed. for anxiety 90 tablet 2  . aspirin 81 MG tablet Take 81 mg by mouth daily.    Marland Kitchen atorvastatin (LIPITOR) 20 MG tablet Take 1 tablet (20 mg total) by mouth daily. 90 tablet 3  . Blood Glucose Monitoring Suppl (ACCU-CHEK AVIVA PLUS) w/Device KIT Use as directed to check blood sugar Dx: E11.9, E11.49 1 kit 0  .  Calcium Carbonate-Vit D-Min (CALTRATE PLUS PO) Take 1 tablet by mouth daily.    . fexofenadine (ALLEGRA) 180 MG tablet Take 180 mg by mouth daily.    . fluticasone (FLONASE) 50 MCG/ACT nasal spray SHAKE LIQUID WELL AND USE 2 SPRAYS IN EACH NOSTRIL TWICE DAILY 16 g 5  . Fluticasone-Umeclidin-Vilant (TRELEGY ELLIPTA) 100-62.5-25 MCG/INH AEPB Inhale 1 puff daily into the lungs. 1 each 6  . folic acid (FOLVITE) 1 MG tablet Take 1 tablet (1 mg total) by mouth daily. 30 tablet 4  . HYDROcodone-acetaminophen (NORCO) 10-325 MG tablet Take 1 tablet by mouth 2 (two) times daily as needed. 90 tablet 0  . loratadine (CLARITIN) 10 MG tablet TAKE 1 TABLET BY MOUTH EVERY DAY 30 tablet 5  . metFORMIN (GLUCOPHAGE) 500 MG tablet TAKE 1 TABLET BY MOUTH EVERY MORNING AS NEEDED FOR BLOOD SUGAR OVER 160 AND 1 TABLET BY MOUTH EVERY EVENING 60 tablet 1  . omeprazole (PRILOSEC) 20 MG capsule Take 1 capsule (20 mg total) by mouth daily. 90 capsule 3  . OXYGEN Inhale 4 L into the lungs.    . potassium chloride (K-DUR,KLOR-CON) 10 MEQ tablet Take 1 tablet (10 mEq total) daily by mouth. 90 tablet 3  . potassium chloride (K-DUR,KLOR-CON) 10 MEQ tablet TAKE 1 TABLET BY MOUTH ONCE  DAILY 30 tablet 0  . senna (SENOKOT) 8.6 MG tablet Take 1 tablet by mouth daily.    . traMADol (ULTRAM) 50 MG tablet Take 2 tablets (100 mg total) every 6 (six) hours as needed by mouth for severe pain. 90 tablet 1  . UNABLE TO FIND Med Name: Cranial Prothesis for Chemotherapy 1 each 0  . furosemide (LASIX) 40 MG tablet Take 1 tablet (40 mg total) by mouth daily for 3 doses. 3 tablet 0   No current facility-administered medications for this visit.    Facility-Administered Medications Ordered in Other Visits  Medication Dose Route Frequency Provider Last Rate Last Dose  . hyaluronate sodium (RADIAPLEXRX) gel   Topical Once Thea Silversmith, MD      . sodium chloride flush (NS) 0.9 % injection 10 mL  10 mL Intravenous PRN Curt Bears, MD      . topical emolient (BIAFINE) emulsion   Topical PRN Thea Silversmith, MD        SURGICAL HISTORY:  Past Surgical History:  Procedure Laterality Date  . BRONCHOSCOPY  02/2011  . CARDIAC CATHETERIZATION    . CATARACT EXTRACTION  03/12/2013   Left Eye   . DILATION AND CURETTAGE OF UTERUS  1980's  . EYE SURGERY Left 2014   Cataract  . FETAL BLOOD TRANSFUSION  09/16/11  . JOINT REPLACEMENT  04/13/10   Left Hip  . LUNG LOBECTOMY  1986   left  . SEPTOPLASTY    . TOTAL HIP ARTHROPLASTY  01/2010   left  . TUBAL LIGATION  1980's    REVIEW OF SYSTEMS:  A comprehensive review of systems was negative except for: Constitutional: positive for fatigue Respiratory: positive for cough and dyspnea on exertion   PHYSICAL EXAMINATION: General appearance: alert, cooperative, fatigued and mild distress Head: Normocephalic, without obvious abnormality, atraumatic Neck: no adenopathy, no JVD, supple, symmetrical, trachea midline and thyroid not enlarged, symmetric, no tenderness/mass/nodules Lymph nodes: Cervical, supraclavicular, and axillary nodes normal. Resp: wheezes bilaterally Back: symmetric, no curvature. ROM normal. No CVA tenderness. Cardio: regular  rate and rhythm, S1, S2 normal, no murmur, click, rub or gallop GI: soft, non-tender; bowel sounds normal; no masses,  no organomegaly Extremities:  extremities normal, atraumatic, no cyanosis or edema  ECOG PERFORMANCE STATUS: 1 - Symptomatic but completely ambulatory  Blood pressure (!) 127/49, pulse (!) 108, temperature 99.1 F (37.3 C), temperature source Oral, resp. rate 17, height 5' 5" (1.651 m), weight 178 lb 8 oz (81 kg), SpO2 100 %.  LABORATORY DATA: Lab Results  Component Value Date   WBC 11.0 (H) 07/21/2017   HGB 12.2 07/21/2017   HCT 36.3 07/21/2017   MCV 89.7 07/21/2017   PLT 278 07/21/2017      Chemistry      Component Value Date/Time   NA 140 07/07/2017 1035   NA 141 04/07/2017 1016   K 3.1 (L) 07/07/2017 1035   K 3.4 (L) 04/07/2017 1016   CL 100 07/07/2017 1035   CL 99 07/21/2012 1125   CO2 29 07/07/2017 1035   CO2 27 04/07/2017 1016   BUN 15 07/07/2017 1035   BUN 13.8 04/07/2017 1016   CREATININE 0.96 07/07/2017 1035   CREATININE 1.1 04/07/2017 1016      Component Value Date/Time   CALCIUM 9.2 07/07/2017 1035   CALCIUM 9.5 04/07/2017 1016   ALKPHOS 86 07/07/2017 1035   ALKPHOS 94 04/07/2017 1016   AST 14 07/07/2017 1035   AST 13 04/07/2017 1016   ALT 12 07/07/2017 1035   ALT 8 04/07/2017 1016   BILITOT 0.5 07/07/2017 1035   BILITOT 0.36 04/07/2017 1016       RADIOGRAPHIC STUDIES: Ct Chest Wo Contrast  Result Date: 06/23/2017 CLINICAL DATA:  Non-small-cell lung cancer. Originally diagnosed in 2012. Currently on immunotherapy. EXAM: CT CHEST WITHOUT CONTRAST TECHNIQUE: Multidetector CT imaging of the chest was performed following the standard protocol without IV contrast. COMPARISON:  03/24/2017 FINDINGS: Cardiovascular: Aortic and branch vessel atherosclerosis. Normal heart size, without pericardial effusion. Multivessel coronary artery atherosclerosis. Mediastinum/Nodes: No supraclavicular adenopathy. No well-defined mediastinal adenopathy. Hilar  regions poorly evaluated without intravenous contrast. Lungs/Pleura: Small right-sided pleural effusion is decreased. 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 Right upper lobe endobronchial compression/narrowing is similar. Left upper lobectomy. Central right upper lobe nodular density measures 1.4 x 1.3 cm on image 62/7 versus similar on the prior exam (when remeasured). The 8 mm ground-glass nodule and adjacent 2 mm soft tissue nodule in the right lower lobe are felt to be similar, including on 94/7. Minimal nodularity along the right major fissure is felt to be new on image 78/7. Anteromedial right lower lobe band like consolidation could be treatment related and is similar. Minimal improvement in right upper lobe aeration with ground-glass and airspace disease remaining. Re-demonstration of collapse/consolidation in the posteromedial right upper lobe. Posterior left lower lobe atelectasis or scar. more anterior left lower lobe ground-glass opacity is similar, including on image 60/7. Upper Abdomen: Normal imaged portions of the liver, spleen, stomach, pancreas, gallbladder, left kidney. Bilateral low-density adrenal nodularity is not significantly changed, favoring adenomas. Musculoskeletal: Mild osteopenia. Posttraumatic or postsurgical defects about the right chest wall/ribs. Status post T4 vertebral augmentation with similar mild to moderate T5 superior endplate compression deformity. IMPRESSION: 1. Slight improvement in right-sided aeration. Residual right upper lobe nodular density, not significantly changed. Areas of right upper lobe ground-glass and consolidation persist. 2. Aortic atherosclerosis (ICD10-I70.0), coronary artery atherosclerosis and emphysema (ICD10-J43.9). 3. Slight decrease in right pleural effusion. 4. Similar left lower lobe ground-glass opacity. Electronically Signed   By: Abigail Miyamoto M.D.   On: 06/23/2017 10:00    ASSESSMENT AND PLAN:  This is a very pleasant 72 years old white  female with recurrent non-small cell lung cancer, adenocarcinoma that was initially diagnosed as unresectable stage IIA in December 2012 status post systemic chemotherapy as well as immunotherapy.  She is currently undergoing treatment with immunotherapy with Nivolumab 240 mg IV every 2 weeks.  She is status post 8 cycles of the new regimen. The patient continues to tolerate his treatment fairly well. I recommended for her to proceed with cycle #9 today as a scheduled. She will come back for follow-up visit in 2 weeks for evaluation before the next cycle of her treatment. She was advised to call immediately if she has any concerning symptoms in the interval. The patient voices understanding of current disease status and treatment options and is in agreement with the current care plan. All questions were answered. The patient knows to call the clinic with any problems, questions or concerns. We can certainly see the patient much sooner if necessary.  Disclaimer: This note was dictated with voice recognition software. Similar sounding words can inadvertently be transcribed and may not be corrected upon review.

## 2017-07-21 NOTE — Telephone Encounter (Signed)
Printed avs and calender of upcoming appointment. Per 4/11 los

## 2017-07-21 NOTE — Progress Notes (Signed)
[name] ok to treat with 106 heart rate.

## 2017-07-25 ENCOUNTER — Other Ambulatory Visit: Payer: Self-pay | Admitting: Internal Medicine

## 2017-07-25 DIAGNOSIS — M542 Cervicalgia: Secondary | ICD-10-CM

## 2017-07-25 NOTE — Telephone Encounter (Signed)
Done hardcopy to [facility] - I think rx needs to be faxed

## 2017-07-25 NOTE — Telephone Encounter (Signed)
05/16/2017 90# 

## 2017-07-25 NOTE — Telephone Encounter (Signed)
Faxed

## 2017-07-28 ENCOUNTER — Ambulatory Visit: Payer: Medicare Other

## 2017-07-28 ENCOUNTER — Ambulatory Visit: Payer: Medicare Other | Admitting: Oncology

## 2017-07-28 ENCOUNTER — Other Ambulatory Visit: Payer: Medicare Other

## 2017-08-02 ENCOUNTER — Other Ambulatory Visit: Payer: Self-pay | Admitting: Internal Medicine

## 2017-08-02 ENCOUNTER — Telehealth: Payer: Self-pay | Admitting: Medical Oncology

## 2017-08-02 ENCOUNTER — Other Ambulatory Visit: Payer: Self-pay | Admitting: Emergency Medicine

## 2017-08-02 ENCOUNTER — Telehealth: Payer: Self-pay | Admitting: *Deleted

## 2017-08-02 ENCOUNTER — Ambulatory Visit (HOSPITAL_COMMUNITY): Payer: Medicare Other

## 2017-08-02 ENCOUNTER — Other Ambulatory Visit: Payer: Self-pay | Admitting: *Deleted

## 2017-08-02 DIAGNOSIS — J449 Chronic obstructive pulmonary disease, unspecified: Secondary | ICD-10-CM

## 2017-08-02 NOTE — Telephone Encounter (Signed)
More SOB, -pt wants to cancel appt this week ,but wants a cxr-this call was handled by Shelle Iron , RN

## 2017-08-02 NOTE — Telephone Encounter (Signed)
Patient called concerned about the increasing shortness of breath that she is experiencing.  She states that she is feeling like she is being smothered.  Patient reports using 4 liters of oxygen and wants to have a chest xray.  Concerned she might have pnemonia.  Also reports she wants to cancel her infusion for Thursday.  Reviewed with Sandi Mealy, PA as patient denies needing to go to Emergency room at present time and unable to come to [facility] today and can come tomorrow.  Ok to schedule Lab/Chest Xray/[facility] visit for 08/03/2017.

## 2017-08-03 ENCOUNTER — Other Ambulatory Visit: Payer: Self-pay

## 2017-08-03 ENCOUNTER — Emergency Department (HOSPITAL_COMMUNITY): Payer: Medicare Other

## 2017-08-03 ENCOUNTER — Encounter: Payer: Medicare Other | Admitting: Medical

## 2017-08-03 ENCOUNTER — Emergency Department (HOSPITAL_COMMUNITY)
Admission: EM | Admit: 2017-08-03 | Discharge: 2017-08-03 | Discharge status: Against Medical Advice | Payer: Medicare Other | Attending: Emergency Medicine | Admitting: Emergency Medicine

## 2017-08-03 ENCOUNTER — Other Ambulatory Visit: Payer: Medicare Other

## 2017-08-03 ENCOUNTER — Telehealth: Payer: Self-pay | Admitting: *Deleted

## 2017-08-03 ENCOUNTER — Encounter (HOSPITAL_COMMUNITY): Payer: Self-pay

## 2017-08-03 DIAGNOSIS — R0602 Shortness of breath: Secondary | ICD-10-CM | POA: Diagnosis present

## 2017-08-03 DIAGNOSIS — C3411 Malignant neoplasm of upper lobe, right bronchus or lung: Secondary | ICD-10-CM | POA: Diagnosis not present

## 2017-08-03 DIAGNOSIS — Z5112 Encounter for antineoplastic immunotherapy: Secondary | ICD-10-CM | POA: Diagnosis not present

## 2017-08-03 DIAGNOSIS — R05 Cough: Secondary | ICD-10-CM | POA: Diagnosis not present

## 2017-08-03 DIAGNOSIS — Z5321 Procedure and treatment not carried out due to patient leaving prior to being seen by health care provider: Secondary | ICD-10-CM | POA: Insufficient documentation

## 2017-08-03 NOTE — Telephone Encounter (Signed)
06/13/2017 60# 

## 2017-08-03 NOTE — ED Triage Notes (Addendum)
Patient c/o increased SOB x 1 1/2 weeks. Patient is currently receiving infusions for lung cancer. Patient c/o a productive cough with white/yellow sputum.

## 2017-08-03 NOTE — Telephone Encounter (Addendum)
Returned patient's phone call regarding today's appointment with Sandi Mealy, PA, in [facility] at 11:00 am. Patient stated,"I'm having shortness of breath and I use 4L of oxygen a day. Should I turn it up to 5 liters?" I said, "No. You need to call 911 now and let the ambulance bring you to the ER. Do you want me to call them for you? She said,"No. I will wait until my son gets off work and he can bring me around 4:00 or 5:00 clock. I think I may have pneumonia." I reinforced the need to call 911 now and not later. She said, "thanks for calling."

## 2017-08-03 NOTE — ED Notes (Addendum)
Pt and family have ask numerous times for an update as they sat in Triage 3 for a few hours.  Writer was unable to give results to pt, it is out of my scope.  Writer informed triage nurses of request, it was stated that they would talk to pt.  Pt family came to blood draw room and ask for writer to [facility] pt and they would go and see her primary care provider in the morning.  RN notified.  Pt left without being seen by doctor

## 2017-08-03 NOTE — Telephone Encounter (Signed)
Received voice mail message from patient stating,"I can't make it today because I don't have any transportation. I'm going to lay back down and get some more rest. I should be up around 11:00 am if you can call me back then. My return number is [phone]."

## 2017-08-03 NOTE — Telephone Encounter (Signed)
Xanax refill too soon

## 2017-08-03 NOTE — ED Notes (Signed)
Family to desk and reported they were going to leave

## 2017-08-04 ENCOUNTER — Inpatient Hospital Stay: Payer: Medicare Other

## 2017-08-04 ENCOUNTER — Telehealth: Payer: Self-pay | Admitting: Emergency Medicine

## 2017-08-04 ENCOUNTER — Inpatient Hospital Stay: Payer: Medicare Other | Admitting: Oncology

## 2017-08-04 MED ORDER — AMOXICILLIN-POT CLAVULANATE 875-125 MG PO TABS: 1.0000 | ORAL_TABLET | Freq: Two times a day (BID) | ORAL | 0 refills | Status: DC

## 2017-08-04 NOTE — Telephone Encounter (Signed)
Spoke with pt's son Legrand Como, requesting cxr results from pt's ED stay last night.  Pt left the ED before the cxr results were relayed.    Sending to DOD as RB is off this week.  CY please advise on cxr results/recs.  Thanks!

## 2017-08-04 NOTE — Telephone Encounter (Signed)
Pt and son aware of recs.  rx sent to preferred pharmacy.  Nothing further needed at this time.

## 2017-08-04 NOTE — Telephone Encounter (Signed)
CXR- There are old treatment changes in the lungs. There is increased density in the right upper lung which might be pneumonia. If she feels as if she has an infection, suggest we offer  augmentin 875 mg, # 14, 1 twice daily.

## 2017-08-05 ENCOUNTER — Telehealth: Payer: Self-pay | Admitting: Emergency Medicine

## 2017-08-05 MED ORDER — PREDNISONE 10 MG (21) PO TBPK: ORAL_TABLET | Freq: Every day | ORAL | 0 refills | Status: DC

## 2017-08-05 NOTE — Telephone Encounter (Signed)
Pt requesting a pred taper to go with the augmentin called in yesterday for possible PNA based off of 4/25 cxr.  CY called in augmentin 875mg  X14, 1 po bid.    Pt uses [facility] on [address].    Sending to DOD as RB is unavailable today.  MW please advise.  Thanks.

## 2017-08-05 NOTE — Telephone Encounter (Signed)
Prednisone 10 mg take  4 each am x 2 days,   2 each am x 2 days,  1 each am x 2 days and stop  

## 2017-08-05 NOTE — Telephone Encounter (Signed)
Called and spoke to patient. Advised patient that MW is prescribing a prednisone taper. Patient verbalized understanding and thanked staff for getting this sent in for her. Rx sent to pharmacy of choice. Nothing further needed at this time.

## 2017-08-09 ENCOUNTER — Telehealth: Payer: Self-pay | Admitting: Emergency Medicine

## 2017-08-09 NOTE — Telephone Encounter (Signed)
Pt seen in ED on 4/24 but left before evaluation. Called to office on 4/25, prescribed Augmentin 875mg  BID X7 days, Called to office again on 4/26, was prescribed Pred taper.  Spoke with pt, states that she was taking Augmentin QD instead of BID until 4/28.  Pt is not feeling any better- c/o sob, chest congestion, prod cough with green/yellow mucus, fatigue, chills.  Pt is concerned that she may need to be admitted to hospital, but does not want to go back to ED.    RB please advise.  Thanks.

## 2017-08-09 NOTE — Telephone Encounter (Signed)
Called and spoke with patient, she refused to go to the ER. Patient has been scheduled for an appointment with TP tomorrow.

## 2017-08-09 NOTE — Telephone Encounter (Signed)
Per RB >> Pt needs to be evaluated by our office or been seen in the ED.

## 2017-08-10 ENCOUNTER — Encounter: Payer: Self-pay | Admitting: Adult Health

## 2017-08-10 ENCOUNTER — Ambulatory Visit (INDEPENDENT_AMBULATORY_CARE_PROVIDER_SITE_OTHER): Payer: Medicare Other | Admitting: Adult Health

## 2017-08-10 ENCOUNTER — Ambulatory Visit: Payer: Medicare Other | Admitting: Gastroenterology

## 2017-08-10 ENCOUNTER — Telehealth: Payer: Self-pay | Admitting: Adult Health

## 2017-08-10 DIAGNOSIS — J441 Chronic obstructive pulmonary disease with (acute) exacerbation: Secondary | ICD-10-CM

## 2017-08-10 DIAGNOSIS — J189 Pneumonia, unspecified organism: Secondary | ICD-10-CM | POA: Diagnosis not present

## 2017-08-10 DIAGNOSIS — C3411 Malignant neoplasm of upper lobe, right bronchus or lung: Secondary | ICD-10-CM | POA: Diagnosis not present

## 2017-08-10 MED ORDER — AMOXICILLIN-POT CLAVULANATE 875-125 MG PO TABS: 1.0000 | ORAL_TABLET | Freq: Two times a day (BID) | ORAL | 0 refills | Status: AC

## 2017-08-10 NOTE — Patient Instructions (Addendum)
Extend out Augmentin for additional 3 days ,take with food.  Mucinex Twice daily  As needed  Cough /congestion .  Continue on TRELEGY daily , rinse after use.  Follow up in 1 week with chest xray with [name]  And As needed   Please contact office for sooner follow up if symptoms do not improve or worsen or seek emergency care

## 2017-08-10 NOTE — Progress Notes (Signed)
_0  ID: [name], female    DOB: 06-Jan-1946, 72 y.o.   MRN: [id number]  Chief Complaint  Patient presents with  . 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  And oxygen dependent respiratory failure Past medical history significant for chronic pain and fibromyalgia  08/10/2017 Follow up ; PNA , COPD  Patient returns for a follow-up from recent emergency room visit.  Patient was recently seen in the emergency room and found to have a possible pneumonia with increased consolidation along the right upper lobe.  Patient had increased cough and congestion with thick yellow to green mucus.  Increased fatigue and shortness of breath.  Patient was started on Augmentin.  She says she is starting to feel some better.  She has 3 days left of her antibiotic.  She was also given a steroid taper.  She does feel that her breathing has improved some.  She denies any hemoptysis, chest pain, orthopnea, increased leg swelling. Appetite is fair.  No nausea vomiting or diarrhea..  Patient has known lung cancer followed by oncology currently on immunotherapy. Most recent CT chest showed slight improvement in the right-sided aeration.  Residual right upper lobe nodular density is not significantly changed.  Areas of right upper lobe groundglass and consolidation persist.  Decrease in the right pleural effusion.    Allergies  Allergen Reactions  . Iohexol Shortness Of Breath and Other (See Comments)    "burns me up inside"    Pt does fine with 13 hour prep  01/17/12  . Other     CAN'T STAND THE SMELL OF PURE BLEACH; "HAVE TO BACK UP AND POUR AND DON'T BREATH IT TIL i GET CAP BACK ON; DILUTE IT TO SPRAY"  . Anoro Ellipta [Umeclidinium-Vilanterol] Swelling    Swelling of lips and mouth  . Codeine Itching and Nausea And Vomiting    REACTION: GI upset  . Protonix [Pantoprazole  Sodium] Diarrhea    Immunization History  Administered Date(s) Administered  . Influenza Split 02/08/2007, 03/19/2011, 01/27/2012  . Influenza Whole 01/02/2010  . Influenza, High Dose Seasonal PF 02/03/2017  . Influenza,inj,Quad PF,6+ Mos 12/27/2012, 02/12/2014, 01/22/2016  . Pneumococcal Conjugate-13 02/14/2017  . Pneumococcal Polysaccharide-23 03/27/2013  . Tdap 02/14/2017    Past Medical History:  Diagnosis Date  . Adenocarcinoma, lung ([place])    upper left lobe  . Anemia in neoplastic disease   . Anxiety   . Arthritis    "all over my body"  . Asthma   . Asymptomatic varicose veins   . Atherosclerosis of native arteries of the extremities, unspecified   . Atrial fibrillation ([place])   . Bronchial pneumonia    history  . 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 Chemotherapy adverse reaction 09/15/11   "makes me light headed and pass out; this is 3rd blood transfusion since going thru it"  . CHF (congestive heart failure) ([place])   . Chronic airway obstruction, not elsewhere classified   . Chronic fatigue 08/19/2015  . Chronic sinusitis   . Complication of anesthesia    slow to wake up  . COPD with emphysema ([place])   . Coronary artery disease    Dr. Burt Knack had stress test done  . DDD (degenerative disc disease), lumbar   . Depressive disorder, not elsewhere classified   . Diseases of lips   .  Disorder of bone and cartilage, unspecified   . DVT (deep venous thrombosis) ([place])   . Fibromyalgia   . Functional urinary incontinence   . GERD (gastroesophageal reflux disease)   . Hard of hearing, right   . Herpes zoster without mention of complication   . Hip pain, chronic 09/17/2014  . History of blood clots    "2 in my aorta"  . History of blood transfusion   . History of bronchitis   . History of echocardiogram    Echo 11/18: mild LVH, EF 60-65, no RWMA, Gr 1 DD, Ao Sclerosis, mild AI, RVH with low nl RSVF, trivial eff  . Hx of radiation therapy 04/28/11 -06/08/11   right  upper lobe-lung  . Hyperlipidemia   . Hypertension   . Kidney infection    "related to chemo"  . Lumbago   . Malignant neoplasm of bronchus and lung, unspecified site   . Mental disorder   . Muscle weakness (generalized)   . Non-small cell lung cancer ([place]) 03/31/2011   Adenocarcinoma Rt upper lobe mass  . Osteoarthrosis, unspecified whether generalized or localized, unspecified site   . Osteoporosis   . Other abnormal blood chemistry   . Other and unspecified hyperlipidemia   . PAD (peripheral artery disease) ([place])   . Pain in thoracic spine   . 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 Primary cancer of right upper lobe of lung ([place]) 02/08/2011   Biopsy Rt upper lobe mass 03/31/11>>adenocarcinoma DIAGNOSIS: Stage IIB/IIIA non-small cell lung cancer consistent with adenocarcinoma diagnosed in December 2012.   PRIOR THERAPY:  1) Status post radiation therapy to the right upper lobe lung mass completed on 06/04/2011 under the care of Dr. Pablo Ledger.  2) Systemic chemotherapy with carboplatin for AUC of 5 and Alimta 500 mg/M2 giving every 3   . Reflux   . Reflux esophagitis   . Respiratory failure with hypoxia 01/02/2010  . Restless legs syndrome (RLS)   . Scoliosis   . Screening for thyroid disorder   . Shortness of breath    "anytime really"  . Sinus headache    "all the time"  . Spasm of muscle   . Syncope and collapse   . Type II or unspecified type diabetes mellitus with neurological manifestations, uncontrolled(250.62)   . Type II or unspecified type diabetes mellitus without mention of complication, not stated as uncontrolled   . Type II or unspecified type diabetes mellitus without mention of complication, not stated as uncontrolled   . Unspecified constipation   . Unspecified essential hypertension   . Unspecified hearing loss   . Unspecified hereditary and idiopathic peripheral neuropathy   . Unspecified vitamin D deficiency     Tobacco History: Social History    Tobacco Use  Smoking Status Former Smoker  . Packs/day: 1.00  . Years: 40.00  . Pack years: 40.00  . Types: Cigarettes  . Last attempt to quit: 02/10/1998  . Years since quitting: 19.5  Smokeless Tobacco Never Used   Counseling given: Not Answered   Outpatient Encounter Medications as of 08/10/2017  Medication Sig  . ACCU-CHEK SOFTCLIX LANCETS lancets by Other route. Check blood sugar twice daily. Dx: E11.9, E11.49  . albuterol (PROVENTIL HFA;VENTOLIN HFA) 108 (90 Base) MCG/ACT inhaler Inhale 2 puffs into the lungs every 6 (six) hours as needed for wheezing or shortness of breath.  Marland Kitchen albuterol (PROVENTIL) (2.5 MG/3ML) 0.083% nebulizer solution USE 1 VIAL VIA NEBULIZER EVERY 6 HOURS AS NEEDED FOR WHEEZING OR SHORTNESS OF BREATH  .  ALPRAZolam (XANAX) 1 MG tablet Take 1 tablet (1 mg total) by mouth 3 (three) times daily as needed. for anxiety  . amoxicillin-clavulanate (AUGMENTIN) 875-125 MG tablet Take 1 tablet by mouth 2 (two) times daily.  Marland Kitchen aspirin 81 MG tablet Take 81 mg by mouth daily.  Marland Kitchen atorvastatin (LIPITOR) 20 MG tablet Take 1 tablet (20 mg total) by mouth daily.  . Blood Glucose Monitoring Suppl (ACCU-CHEK AVIVA PLUS) w/Device KIT Use as directed to check blood sugar Dx: E11.9, E11.49  . Calcium Carbonate-Vit D-Min (CALTRATE PLUS PO) Take 1 tablet by mouth daily.  . fexofenadine (ALLEGRA) 180 MG tablet Take 180 mg by mouth daily.  . fluticasone (FLONASE) 50 MCG/ACT nasal spray SHAKE LIQUID WELL AND USE 2 SPRAYS IN EACH NOSTRIL TWICE DAILY  . Fluticasone-Umeclidin-Vilant (TRELEGY ELLIPTA) 100-62.5-25 MCG/INH AEPB Inhale 1 puff daily into the lungs.  . folic acid (FOLVITE) 1 MG tablet Take 1 tablet (1 mg total) by mouth daily.  Marland Kitchen HYDROcodone-acetaminophen (NORCO) 10-325 MG tablet Take 1 tablet by mouth 2 (two) times daily as needed.  . loratadine (CLARITIN) 10 MG tablet TAKE 1 TABLET BY MOUTH EVERY DAY  . metFORMIN (GLUCOPHAGE) 500 MG tablet TAKE 1 TABLET BY MOUTH EVERY MORNING  AS NEEDED FOR BLOOD SUGAR OVER 160 AND 1 TABLET BY MOUTH EVERY EVENING  . omeprazole (PRILOSEC) 20 MG capsule Take 1 capsule (20 mg total) by mouth daily.  . OXYGEN Inhale 4 L into the lungs.  . potassium chloride (K-DUR,KLOR-CON) 10 MEQ tablet Take 1 tablet (10 mEq total) daily by mouth.  . potassium chloride (K-DUR,KLOR-CON) 10 MEQ tablet TAKE 1 TABLET BY MOUTH ONCE DAILY  . predniSONE (STERAPRED UNI-PAK 21 TAB) 10 MG (21) TBPK tablet Take by mouth daily. Take 34m X 2 days, 222mX 2, 1079m 2 then stop  . senna (SENOKOT) 8.6 MG tablet Take 1 tablet by mouth daily.  . traMADol (ULTRAM) 50 MG tablet TAKE 2 TABLETS (100MG TOTAL) BY MOUTH EVERY 6 HOURS AS NEEDED FOR SEVERE PAIN  . UNABLE TO FIND Med Name: Cranial Prothesis for Chemotherapy  . VENTOLIN HFA 108 (90 Base) MCG/ACT inhaler INHALE 2 PUFFS BY MOUTH EVERY 6 HOURS AS NEEDED FOR WHEEZING OR SHORTNESS OF BREATH  . amoxicillin-clavulanate (AUGMENTIN) 875-125 MG tablet Take 1 tablet by mouth 2 (two) times daily for 3 days.  . furosemide (LASIX) 40 MG tablet Take 1 tablet (40 mg total) by mouth daily for 3 doses.  . [DISCONTINUED] calcium carbonate 200 MG capsule Take 250 mg by mouth daily. Does not take everyday   Facility-Administered Encounter Medications as of 08/10/2017  Medication  . hyaluronate sodium (RADIAPLEXRX) gel  . sodium chloride flush (NS) 0.9 % injection 10 mL  . topical emolient (BIAFINE) emulsion     Review of Systems  Constitutional:   No  weight loss, night sweats,  Fevers, chills,  +fatigue, or  lassitude.  HEENT:   No headaches,  Difficulty swallowing,  Tooth/dental problems, or  Sore throat,                No sneezing, itching, ear ache, nasal congestion, post nasal drip,   CV:  No chest pain,  Orthopnea, PND, swelling in lower extremities, anasarca, dizziness, palpitations, syncope.   GI  No heartburn, indigestion, abdominal pain, nausea, vomiting, diarrhea, change in bowel habits, loss of appetite, bloody  stools.   Resp:  .  No chest wall deformity  Skin: no rash or lesions.  GU: no dysuria, change in  color of urine, no urgency or frequency.  No flank pain, no hematuria   MS:  No joint pain or swelling.  No decreased range of motion.  No back pain.    Physical Exam  BP (!) 142/62 (BP Location: Left Arm, Cuff Size: Normal)   Pulse 88   Temp 98.6 F (37 C)   Ht _0  (1.651 m)   Wt 171 lb (77.6 kg)   SpO2 99%   BMI 28.46 kg/m   GEN: A/Ox3; pleasant , NAD, elderly in wc    HEENT:  [place]/AT,  EACs-clear, TMs-wnl, NOSE-clear, THROAT-clear, no lesions, no postnasal drip or exudate noted.   NECK:  Supple w/ fair ROM; no JVD; normal carotid impulses w/o bruits; no thyromegaly or nodules palpated; no lymphadenopathy.    RESP  Few trace rhonchi , no accessory muscle use, no dullness to percussion  CARD:  RRR, no m/r/g, tr  peripheral edema, pulses intact, no cyanosis or clubbing.  GI:   Soft & nt; nml bowel sounds; no organomegaly or masses detected.   Musco: Warm bil, no deformities or joint swelling noted.   Neuro: alert, no focal deficits noted.    Skin: Warm, no lesions or rashes    Lab Results:  CBC    Component Value Date/Time   WBC 11.0 (H) 07/21/2017 0856   RBC 4.05 07/21/2017 0856   HGB 12.2 07/21/2017 0856   HGB 12.5 07/07/2017 1035   HGB 12.7 04/07/2017 1015   HCT 36.3 07/21/2017 0856   HCT 39.0 04/07/2017 1015   PLT 278 07/21/2017 0856   PLT 234 07/07/2017 1035   PLT 203 04/07/2017 1015   MCV 89.7 07/21/2017 0856   MCV 90.1 04/07/2017 1015   MCH 30.2 07/21/2017 0856   MCHC 33.7 07/21/2017 0856   RDW 13.2 07/21/2017 0856   RDW 13.5 04/07/2017 1015   LYMPHSABS 1.5 07/21/2017 0856   LYMPHSABS 2.1 04/07/2017 1015   MONOABS 0.8 07/21/2017 0856   MONOABS 0.7 04/07/2017 1015   EOSABS 0.8 (H) 07/21/2017 0856   EOSABS 1.1 (H) 04/07/2017 1015   EOSABS 0.4 08/06/2015 1036   BASOSABS 0.1 07/21/2017 0856   BASOSABS 0.0 04/07/2017 1015    BMET      Component Value Date/Time   NA 141 07/21/2017 0856   NA 141 04/07/2017 1016   K 3.2 (L) 07/21/2017 0856   K 3.4 (L) 04/07/2017 1016   CL 101 07/21/2017 0856   CL 99 07/21/2012 1125   CO2 29 07/21/2017 0856   CO2 27 04/07/2017 1016   GLUCOSE 108 07/21/2017 0856   GLUCOSE 93 04/07/2017 1016   GLUCOSE 184 (H) 07/21/2012 1125   BUN 8 07/21/2017 0856   BUN 13.8 04/07/2017 1016   CREATININE 0.90 07/21/2017 0856   CREATININE 0.96 07/07/2017 1035   CREATININE 1.1 04/07/2017 1016   CALCIUM 9.6 07/21/2017 0856   CALCIUM 9.5 04/07/2017 1016   GFRNONAA >60 07/21/2017 0856   GFRNONAA 57 (L) 07/07/2017 1035   GFRAA >60 07/21/2017 0856   GFRAA >60 07/07/2017 1035    BNP    Component Value Date/Time   BNP 18.2 06/27/2013 1421    ProBNP    Component Value Date/Time   PROBNP 160 11/30/2016 1627    Imaging: Dg Chest 2 View  Result Date: 08/03/2017 CLINICAL DATA:  Increased dyspnea for 1-1/2 weeks. Patient is receiving infusions for lung cancer and complains of productive cough. EXAM: CHEST - 2 VIEW COMPARISON:  06/22/2017 CT and 06/09/2017 CXR FINDINGS:  Heart and mediastinal contours are stable with surgical clips and chain sutures about the mediastinum and left hilum as before. Bandlike opacity in inferior right upper lobe appears more confluent than prior studies and may reflect progression of airspace disease and potentially pneumonia. Chronic right-sided rib fractures and upper thoracic kyphoplasty. IMPRESSION: More confluent bandlike opacity in the inferior right upper lobe may reflect superimposed pneumonia on post treatment change. Stable postop appearance of the left hilum and mediastinum. Electronically Signed   By: Ashley Royalty M.D.   On: 08/03/2017 19:10     Assessment & Plan:   COPD (chronic obstructive pulmonary disease) (HCC) Exacerbation with associated pneumonia.  Patient is slowly resolving.  Plan Patient Instructions  Extend out Augmentin for additional 3 days ,take  with food.  Mucinex Twice daily  As needed  Cough /congestion .  Continue on TRELEGY daily , rinse after use.  Follow up in 1 week with chest xray with [name]  And As needed   Please contact office for sooner follow up if symptoms do not improve or worsen or seek emergency care       Pneumonia Probable RUL with underlying PNA .  Clinically improving with abx  Will extend out for 3 days , for 10 d total .   Plan  Patient Instructions  Extend out Augmentin for additional 3 days ,take with food.  Mucinex Twice daily  As needed  Cough /congestion .  Continue on TRELEGY daily , rinse after use.  Follow up in 1 week with chest xray with [name]  And As needed   Please contact office for sooner follow up if symptoms do not improve or worsen or seek emergency care       Primary cancer of right upper lobe of lung ([place]) Recurrent flare - possible PNA , if not clinically better on return in 1 week , may need to hold cancer tx briefly so she can rebound.       Rexene Edison, NP 08/10/2017

## 2017-08-10 NOTE — Telephone Encounter (Signed)
Pt seen by TP today for COPD exacerbation Pt requesting refill on Vicodin 10-325mg  BID prin Pt stated [facility] has filled this for her in the past for a 90 day supply Should this be continued to be filled by this office?  PCP last filled for #90 on 3.25.19  Dr Lamonte Sakai please advise, thank you.

## 2017-08-10 NOTE — Assessment & Plan Note (Signed)
Probable RUL with underlying PNA .  Clinically improving with abx  Will extend out for 3 days , for 10 d total .   Plan  Patient Instructions  Extend out Augmentin for additional 3 days ,take with food.  Mucinex Twice daily  As needed  Cough /congestion .  Continue on TRELEGY daily , rinse after use.  Follow up in 1 week with chest xray with [name]  And As needed   Please contact office for sooner follow up if symptoms do not improve or worsen or seek emergency care

## 2017-08-10 NOTE — Assessment & Plan Note (Signed)
Exacerbation with associated pneumonia.  Patient is slowly resolving.  Plan Patient Instructions  Extend out Augmentin for additional 3 days ,take with food.  Mucinex Twice daily  As needed  Cough /congestion .  Continue on TRELEGY daily , rinse after use.  Follow up in 1 week with chest xray with [name]  And As needed   Please contact office for sooner follow up if symptoms do not improve or worsen or seek emergency care

## 2017-08-10 NOTE — Assessment & Plan Note (Signed)
Recurrent flare - possible PNA , if not clinically better on return in 1 week , may need to hold cancer tx briefly so she can rebound.

## 2017-08-11 ENCOUNTER — Emergency Department (HOSPITAL_COMMUNITY)
Admission: EM | Admit: 2017-08-11 | Discharge: 2017-08-12 | Disposition: A | Discharge status: Home / Self Care | Payer: Medicare Other | Attending: Emergency Medicine | Admitting: Emergency Medicine

## 2017-08-11 ENCOUNTER — Emergency Department (HOSPITAL_COMMUNITY): Payer: Medicare Other

## 2017-08-11 ENCOUNTER — Encounter (HOSPITAL_COMMUNITY): Payer: Self-pay | Admitting: *Deleted

## 2017-08-11 DIAGNOSIS — Z85118 Personal history of other malignant neoplasm of bronchus and lung: Secondary | ICD-10-CM | POA: Insufficient documentation

## 2017-08-11 DIAGNOSIS — M7989 Other specified soft tissue disorders: Secondary | ICD-10-CM | POA: Diagnosis not present

## 2017-08-11 DIAGNOSIS — R600 Localized edema: Secondary | ICD-10-CM | POA: Insufficient documentation

## 2017-08-11 DIAGNOSIS — R05 Cough: Secondary | ICD-10-CM | POA: Insufficient documentation

## 2017-08-11 DIAGNOSIS — I509 Heart failure, unspecified: Secondary | ICD-10-CM | POA: Insufficient documentation

## 2017-08-11 DIAGNOSIS — Z86718 Personal history of other venous thrombosis and embolism: Secondary | ICD-10-CM | POA: Diagnosis not present

## 2017-08-11 DIAGNOSIS — I4891 Unspecified atrial fibrillation: Secondary | ICD-10-CM | POA: Diagnosis not present

## 2017-08-11 DIAGNOSIS — Z87891 Personal history of nicotine dependence: Secondary | ICD-10-CM | POA: Insufficient documentation

## 2017-08-11 DIAGNOSIS — R0602 Shortness of breath: Secondary | ICD-10-CM

## 2017-08-11 DIAGNOSIS — J449 Chronic obstructive pulmonary disease, unspecified: Secondary | ICD-10-CM | POA: Insufficient documentation

## 2017-08-11 DIAGNOSIS — R1110 Vomiting, unspecified: Secondary | ICD-10-CM | POA: Insufficient documentation

## 2017-08-11 DIAGNOSIS — I2510 Atherosclerotic heart disease of native coronary artery without angina pectoris: Secondary | ICD-10-CM | POA: Insufficient documentation

## 2017-08-11 DIAGNOSIS — R062 Wheezing: Secondary | ICD-10-CM | POA: Insufficient documentation

## 2017-08-11 DIAGNOSIS — Z79899 Other long term (current) drug therapy: Secondary | ICD-10-CM | POA: Diagnosis not present

## 2017-08-11 DIAGNOSIS — I10 Essential (primary) hypertension: Secondary | ICD-10-CM | POA: Insufficient documentation

## 2017-08-11 LAB — URINALYSIS, ROUTINE W REFLEX MICROSCOPIC
Bacteria, UA: NONE SEEN
Bilirubin Urine: NEGATIVE
Glucose, UA: NEGATIVE mg/dL
KETONES UR: NEGATIVE mg/dL
Leukocytes, UA: NEGATIVE
NITRITE: NEGATIVE
PH: 5 (ref 5.0–8.0)
Protein, ur: NEGATIVE mg/dL
Specific Gravity, Urine: 1.005 (ref 1.005–1.030)

## 2017-08-11 LAB — I-STAT CG4 LACTIC ACID, ED: LACTIC ACID, VENOUS: 1.14 mmol/L (ref 0.5–1.9)

## 2017-08-11 NOTE — ED Provider Notes (Signed)
[facility] DEPT Provider Note  CSN: [id number] Arrival date & time: 08/11/17 2000  Chief Complaint(s) Shortness of Breath  HPI [name] is a 72 y.o. female   The history is provided by the patient.  Shortness of Breath  This is a recurrent problem. Duration: several days. The problem occurs intermittently.The current episode started more than 2 days ago. The problem has been resolved. Associated symptoms include coryza, rhinorrhea, cough, sputum production, wheezing, vomiting and leg swelling. Pertinent negatives include no fever and no hemoptysis. Associated medical issues include asthma, COPD, pneumonia, heart failure and DVT.   Patient diagnosed with pneumonia 1 week ago and is been on Augmentin since.  Past Medical History Past Medical History:  Diagnosis Date  . 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 Anemia in neoplastic disease   . Anxiety   . Arthritis    "all over my body"  . Asthma   . Asymptomatic varicose veins   . Atherosclerosis of native arteries of the extremities, unspecified   . Atrial fibrillation ([place])   . Bronchial pneumonia    history  . 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 Chemotherapy adverse reaction 09/15/11   "makes me light headed and pass out; this is 3rd blood transfusion since going thru it"  . CHF (congestive heart failure) ([place])   . Chronic airway obstruction, not elsewhere classified   . Chronic fatigue 08/19/2015  . Chronic sinusitis   . Complication of anesthesia    slow to wake up  . COPD with emphysema ([place])   . Coronary artery disease    Dr. Burt Knack had stress test done  . DDD (degenerative disc disease), lumbar   . Depressive disorder, not elsewhere classified   . Diseases of lips   . Disorder of bone and cartilage, unspecified   . DVT (deep venous thrombosis) ([place])   . Fibromyalgia   . Functional urinary incontinence   . GERD (gastroesophageal reflux disease)   . Hard of  hearing, right   . Herpes zoster without mention of complication   . Hip pain, chronic 09/17/2014  . History of blood clots    "2 in my aorta"  . History of blood transfusion   . History of bronchitis   . History of echocardiogram    Echo 11/18: mild LVH, EF 60-65, no RWMA, Gr 1 DD, Ao Sclerosis, mild AI, RVH with low nl RSVF, trivial eff  . Hx of radiation therapy 04/28/11 -06/08/11   right upper lobe-lung  . Hyperlipidemia   . Hypertension   . Kidney infection    "related to chemo"  . Lumbago   . Malignant neoplasm of bronchus and lung, unspecified site   . Mental disorder   . Muscle weakness (generalized)   . Non-small cell lung cancer ([place]) 03/31/2011   Adenocarcinoma Rt upper lobe mass  . Osteoarthrosis, unspecified whether generalized or localized, unspecified site   . Osteoporosis   . Other abnormal blood chemistry   . Other and unspecified hyperlipidemia   . PAD (peripheral artery disease) ([place])   . Pain in thoracic spine   . 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 Primary cancer of right upper lobe of lung ([place]) 02/08/2011   Biopsy Rt upper lobe mass 03/31/11>>adenocarcinoma DIAGNOSIS: Stage IIB/IIIA non-small cell lung cancer consistent with adenocarcinoma diagnosed in December 2012.   PRIOR THERAPY:  1) Status post radiation therapy to the right upper lobe lung mass completed on 06/04/2011  under the care of Dr. Pablo Ledger.  2) Systemic chemotherapy with carboplatin for AUC of 5 and Alimta 500 mg/M2 giving every 3   . Reflux   . Reflux esophagitis   . Respiratory failure with hypoxia 01/02/2010  . Restless legs syndrome (RLS)   . Scoliosis   . Screening for thyroid disorder   . Shortness of breath    "anytime really"  . Sinus headache    "all the time"  . Spasm of muscle   . Syncope and collapse   . Type II or unspecified type diabetes mellitus with neurological manifestations, uncontrolled(250.62)   . Type II or unspecified type diabetes mellitus without mention of  complication, not stated as uncontrolled   . Type II or unspecified type diabetes mellitus without mention of complication, not stated as uncontrolled   . Unspecified constipation   . Unspecified essential hypertension   . Unspecified hearing loss   . Unspecified hereditary and idiopathic peripheral neuropathy   . Unspecified vitamin D deficiency    Patient Active Problem List   Diagnosis Date Noted  . Pneumonia 08/10/2017  . Urinary frequency 07/04/2017  . Tachycardia 07/04/2017  . Chronic pain 07/04/2017  . Port-A-Cath in place 05/05/2017  . Memory changes 02/14/2017  . Constipation 02/14/2017  . CAD (coronary artery disease) 11/30/2016  . Candida infection 07/21/2016  . Benign paroxysmal positional vertigo 01/20/2016  . Encounter for antineoplastic immunotherapy 08/19/2015  . Chronic fatigue 08/19/2015  . Anxiety and depression 07/29/2015  . Headache, tension type, chronic 09/17/2014  . Hip pain, chronic 09/17/2014  . History of fall 05/21/2014  . Insomnia 01/09/2014  . Gait abnormality 08/16/2013  . DM type 2 with diabetic peripheral neuropathy ([place]) 08/01/2013  . Oropharyngeal dysphagia 08/01/2013  . PUD (peptic ulcer disease) 08/01/2013  . Myalgia and myositis 08/01/2013  . Esophageal reflux 06/27/2013  . Atherosclerosis of native arteries of the extremities with intermittent claudication 10/26/2012  . 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 Restless legs syndrome (RLS)   . GERD (gastroesophageal reflux disease)   . Heart murmur   . History of blood clots   . Syncope and collapse   . History of blood transfusion   . Fibromyalgia   . DDD (degenerative disc disease), lumbar   . Scoliosis   . Mental disorder   . CHF (congestive heart failure) ([place])   . Hx of radiation therapy   . Back pain 11/26/2011  . Chronic cough 11/26/2011  . SOB (shortness of breath) 09/15/2011  . Weakness generalized 09/15/2011  . Anemia 09/15/2011  . Hypokalemia 08/13/2011  . Primary  cancer of right upper lobe of lung ([place]) 02/08/2011  . Respiratory failure with hypoxia ([place]) 01/02/2010  . PAD (peripheral artery disease) ([place]) 08/06/2008  . Dyslipidemia 10/28/2006  . Essential hypertension 10/28/2006  . Sinusitis, chronic 10/28/2006  . COPD (chronic obstructive pulmonary disease) ([place]) 10/28/2006   Home Medication(s) Prior to Admission medications   Medication Sig Start Date End Date Taking? Authorizing Provider  albuterol (PROVENTIL HFA;VENTOLIN HFA) 108 (90 Base) MCG/ACT inhaler Inhale 2 puffs into the lungs every 6 (six) hours as needed for wheezing or shortness of breath. 06/01/17  Yes Collene Gobble, MD  albuterol (PROVENTIL) (2.5 MG/3ML) 0.083% nebulizer solution USE 1 VIAL VIA NEBULIZER EVERY 6 HOURS AS NEEDED FOR WHEEZING OR SHORTNESS OF BREATH 08/03/17  Yes Byrum, Rose Fillers, MD  ALPRAZolam Duanne Moron) 1 MG tablet Take 1 tablet (1 mg total) by mouth 3 (three) times daily as needed. for anxiety  Patient taking differently: Take 1 mg by mouth 3 (three) times daily as needed for anxiety. for anxiety 06/13/17  Yes [name], MD  amoxicillin-clavulanate (AUGMENTIN) 875-125 MG tablet Take 1 tablet by mouth 2 (two) times daily for 3 days. 08/10/17 08/13/17 Yes Parrett, Tammy S, NP  aspirin 81 MG tablet Take 81 mg by mouth daily.   Yes [provider]  atorvastatin (LIPITOR) 20 MG tablet Take 1 tablet (20 mg total) by mouth daily. 07/06/17 07/06/18 Yes [name], MD  Calcium Carbonate-Vit D-Min (CALTRATE PLUS PO) Take 1 tablet by mouth daily.   Yes [provider]  fluticasone (FLONASE) 50 MCG/ACT nasal spray SHAKE LIQUID WELL AND USE 2 SPRAYS IN EACH NOSTRIL TWICE DAILY Patient taking differently: SHAKE LIQUID WELL AND USE 2 SPRAYS IN EACH NOSTRIL DAILY 04/27/17  Yes Byrum, Rose Fillers, MD  Fluticasone-Umeclidin-Vilant (TRELEGY ELLIPTA) 100-62.5-25 MCG/INH AEPB Inhale 1 puff daily into the lungs. 02/15/17  Yes Collene Gobble, MD  guaiFENesin (MUCINEX) 600 MG 12 hr  tablet Take 600 mg by mouth 2 (two) times daily as needed for cough.   Yes [provider]  loratadine (CLARITIN) 10 MG tablet TAKE 1 TABLET BY MOUTH EVERY DAY 04/27/17  Yes Byrum, Rose Fillers, MD  metFORMIN (GLUCOPHAGE) 500 MG tablet TAKE 1 TABLET BY MOUTH EVERY MORNING AS NEEDED FOR BLOOD SUGAR OVER 160 AND 1 TABLET BY MOUTH EVERY EVENING 06/29/17  Yes [name], MD  omeprazole (PRILOSEC) 20 MG capsule Take 1 capsule (20 mg total) by mouth daily. 07/04/17  Yes [name], MD  OXYGEN Inhale 4 L into the lungs.   Yes [provider]  potassium chloride (K-DUR,KLOR-CON) 10 MEQ tablet TAKE 1 TABLET BY MOUTH ONCE DAILY 06/29/17  Yes [name], MD  senna (SENOKOT) 8.6 MG tablet Take 1 tablet by mouth 2 (two) times daily as needed for constipation.    Yes [provider]  traMADol (ULTRAM) 50 MG tablet TAKE 2 TABLETS ('100MG'$  TOTAL) BY MOUTH EVERY 6 HOURS AS NEEDED FOR SEVERE PAIN 07/25/17  Yes [name], MD  triamterene-hydrochlorothiazide (MAXZIDE-25) 37.5-25 MG tablet Take 1 tablet by mouth daily.  07/25/17  Yes [provider]  ACCU-CHEK SOFTCLIX LANCETS lancets by Other route. Check blood sugar twice daily. Dx: E11.9, E11.49    [provider]  amoxicillin-clavulanate (AUGMENTIN) 875-125 MG tablet Take 1 tablet by mouth 2 (two) times daily. Patient not taking: Reported on 08/11/2017 08/04/17   Baird Lyons D, MD  Blood Glucose Monitoring Suppl (ACCU-CHEK AVIVA PLUS) w/Device KIT Use as directed to check blood sugar Dx: E11.9, E11.49 04/21/16   Estill Dooms, MD  folic acid (FOLVITE) 1 MG tablet Take 1 tablet (1 mg total) by mouth daily. Patient not taking: Reported on 08/11/2017 06/16/17   Curt Bears, MD  furosemide (LASIX) 40 MG tablet Take 1 tablet (40 mg total) by mouth daily for 3 doses. 06/09/17 06/12/17  Collene Gobble, MD  HYDROcodone-acetaminophen (NORCO) 10-325 MG tablet Take 1 tablet by mouth 2 (two) times daily as needed. Patient taking  differently: Take 1 tablet by mouth 2 (two) times daily as needed for moderate pain or severe pain.  07/04/17   [name], MD  potassium chloride (K-DUR,KLOR-CON) 10 MEQ tablet Take 1 tablet (10 mEq total) daily by mouth. Patient not taking: Reported on 08/11/2017 02/14/17   [name], MD  predniSONE (STERAPRED UNI-PAK 21 TAB) 10 MG (21) TBPK tablet Take by mouth  daily. Take '40mg'$  X 2 days, '20mg'$  X 2, '10mg'$  X 2 then stop Patient not taking: Reported on 08/11/2017 08/05/17   Tanda Rockers, MD  UNABLE TO FIND Med Name: Cranial Prothesis for Chemotherapy 12/09/16   Maryanna Shape, NP  VENTOLIN HFA 108 (90 Base) MCG/ACT inhaler INHALE 2 PUFFS BY MOUTH EVERY 6 HOURS AS NEEDED FOR WHEEZING OR SHORTNESS OF BREATH 08/03/17   Collene Gobble, MD  calcium carbonate 200 MG capsule Take 250 mg by mouth daily. Does not take everyday  07/04/11  [provider]                                                                                                                                    Past Surgical History Past Surgical History:  Procedure Laterality Date  . BRONCHOSCOPY  02/2011  . CARDIAC CATHETERIZATION    . CATARACT EXTRACTION  03/12/2013   Left Eye   . DILATION AND CURETTAGE OF UTERUS  1980's  . EYE SURGERY Left 2014   Cataract  . FETAL BLOOD TRANSFUSION  09/16/11  . JOINT REPLACEMENT  04/13/10   Left Hip  . LUNG LOBECTOMY  1986   left  . SEPTOPLASTY    . TOTAL HIP ARTHROPLASTY  01/2010   left  . TUBAL LIGATION  1980's   Family History Family History  Problem Relation Age of Onset  . Bone cancer Father   . Cancer Father        Bone  . Diabetes Mother   . Deep vein thrombosis Mother   . Heart disease Mother        Heart Disease before age 21-  PVD  . Hyperlipidemia Mother   . Hypertension Mother   . Varicose Veins Mother   . Arthritis Daughter     Social History Social History   Tobacco Use  . Smoking status: Former Smoker    Packs/day: 1.00    Years: 40.00    Pack  years: 40.00    Types: Cigarettes    Last attempt to quit: 02/10/1998    Years since quitting: 19.5  . Smokeless tobacco: Never Used  Substance Use Topics  . Alcohol use: No  . Drug use: No   Allergies Iohexol; Other; Anoro ellipta [umeclidinium-vilanterol]; Codeine; and Protonix [pantoprazole sodium]  Review of Systems Review of Systems  Constitutional: Negative for fever.  HENT: Positive for rhinorrhea.   Respiratory: Positive for cough, sputum production, shortness of breath and wheezing. Negative for hemoptysis.   Cardiovascular: Positive for leg swelling.  Gastrointestinal: Positive for vomiting.   All other systems are reviewed and are negative for acute change except as noted in the HPI  Physical Exam Vital Signs  I have reviewed the triage vital signs BP (!) 148/55 (BP Location: Left Arm)   Pulse 98   Temp 98.7 F (37.1 C) (Oral)   Resp (!) 21   SpO2 100%  Physical Exam  Constitutional: She is oriented to person, place, and time. She appears well-developed and well-nourished. No distress.  HENT:  Head: Normocephalic and atraumatic.  Nose: Nose normal.  Eyes: Pupils are equal, round, and reactive to light. Conjunctivae and EOM are normal. Right eye exhibits no discharge. Left eye exhibits no discharge. No scleral icterus.  Neck: Normal range of motion. Neck supple.  Cardiovascular: Normal rate and regular rhythm. Exam reveals no gallop and no friction rub.  No murmur heard. Pulmonary/Chest: Effort normal and breath sounds normal. No stridor. No respiratory distress. She has no rales.  Abdominal: Soft. She exhibits no distension. There is no tenderness.  Musculoskeletal: She exhibits no edema or tenderness.  1+BLE edema  Neurological: She is alert and oriented to person, place, and time.  Skin: Skin is warm and dry. No rash noted. She is not diaphoretic. No erythema.  Psychiatric: She has a normal mood and affect.  Vitals reviewed.   ED Results and  Treatments Labs (all labs ordered are listed, but only abnormal results are displayed) Labs Reviewed  COMPREHENSIVE METABOLIC PANEL - Abnormal; Notable for the following components:      Result Value   Glucose, Bld 117 (*)    ALT 11 (*)    GFR calc non Af Amer 57 (*)    All other components within normal limits  URINALYSIS, ROUTINE W REFLEX MICROSCOPIC - Abnormal; Notable for the following components:   Color, Urine STRAW (*)    Hgb urine dipstick SMALL (*)    All other components within normal limits  CULTURE, BLOOD (ROUTINE X 2)  CULTURE, BLOOD (ROUTINE X 2)  BRAIN NATRIURETIC PEPTIDE  CBC WITH DIFFERENTIAL/PLATELET  CBC WITH DIFFERENTIAL/PLATELET  I-STAT CG4 LACTIC ACID, ED  I-STAT CG4 LACTIC ACID, ED                                                                                                                         EKG  EKG Interpretation  Date/Time:    Ventricular Rate:    PR Interval:    QRS Duration:   QT Interval:    QTC Calculation:   R Axis:     Text Interpretation:        Radiology Dg Chest 2 View  Result Date: 08/11/2017 CLINICAL DATA:  72 year old female with cough. EXAM: CHEST - 2 VIEW COMPARISON:  Chest radiograph dated 08/03/2017 FINDINGS: There is a confluent area of density extending from the right hilum to the lateral pleural surface which appears more prominent and more confluent compared to the prior radiograph and may represent interval development of superimposed infection on an area of post treatment changes. Clinical correlation is recommended. A small right pleural effusion is seen. The left lung is clear. There is no pneumothorax. The cardiac silhouette is within normal limits. Postsurgical changes of the left hilum and multiple surgical clips noted. There is osteopenia with degenerative changes of the spine and upper thoracic vertebroplasty. Sclerotic changes of the left  humeral head as before. No acute osseous pathology. IMPRESSION: Slight  interval prominence of the density in the right mid lung field may represent superimposed infection on post treatment changes. Clinical correlation is recommended. Trace right pleural effusion. Electronically Signed   By: Anner Crete M.D.   On: 08/11/2017 22:10   Ct Chest Wo Contrast  Result Date: 08/11/2017 CLINICAL DATA:  72 year old female with shortness of breath. History of non-small-cell right upper lobe lung cancer status post prior radiation treatment in the right upper lobe mass as well as chemotherapy. EXAM: CT CHEST WITHOUT CONTRAST TECHNIQUE: Multidetector CT imaging of the chest was performed following the standard protocol without IV contrast. COMPARISON:  Chest radiograph dated 08/11/2017 and CT dated 06/22/2017 FINDINGS: Evaluation of this exam is limited in the absence of intravenous contrast. Cardiovascular: There is no cardiomegaly or pericardial effusion. Coronary vascular calcification noted. There is moderate atherosclerotic calcification of the thoracic aorta. No aneurysmal dilatation. The central pulmonary arteries are grossly unremarkable as visualized. Mediastinum/Nodes: Evaluation of the hila is limited in the absence of intravenous contrast. No mediastinal adenopathy. Liquid content noted within the esophagus. The esophagus is otherwise grossly unremarkable. Lungs/Pleura: There is a small right pleural effusion which is similar or slightly increased compared to the prior CT. Stable or minimally increased rounded masslike density in the right suprahilar region now measuring 3.7 x 3.4 cm (previously 3.8 x 3.0 cm) measured on the coronal series. This lesion measures 3.1 x 2.5 cm (previously 2.7 x 2.7 cm) on the axial series. Patchy area of density extending from the right hilum to the pleural surface most likely post radiation treatment and scarring. Multiple small nodular density in the right lung anterior to the postradiation changes again noted without significant interval change  since the prior CT. Nodular density anterior to the right major fissure measures up to 9 mm (series 5, image 80), stable. A 5 mm ground-glass nodular density in the right lower lobe (series 5, image 98) appears similar. There is a background of emphysema. No pneumothorax. Upper Abdomen: Gallstones. Bilateral low attenuating adrenal lesions appears similar with negative attenuation consistent with adenoma. Musculoskeletal: Osteopenia with degenerative changes of the spine. T4 and T5 old compression fracture as well as T4 vertebroplasty changes. No acute osseous pathology. IMPRESSION: 1. Post treatment changes of the right lung with area of scarring extending from the right hilum to the right pleural surface. Right suprahilar soft tissue density appears similar or slightly increased compared to prior CT. This may represent postsurgical changes although recurrent or residual disease is not entirely excluded. PET-CT may provide better evaluation. 2. Stable or slightly increased in the size of the right pleural effusion. 3. Multiple small nodular densities in the right lung, similar to prior CT. 4. Aortic Atherosclerosis (ICD10-I70.0) and Emphysema (ICD10-J43.9). Electronically Signed   By: Anner Crete M.D.   On: 08/11/2017 23:50   Pertinent labs & imaging results that were available during my care of the patient were reviewed by me and considered in my medical decision making (see chart for details).  Medications Ordered in ED Medications - No data to display  Procedures Procedures  (including critical care time)  Medical Decision Making / ED Course I have reviewed the nursing notes for this encounter and the patient's prior records (if available in EHR or on provided paperwork).   Patient is well-appearing, well-hydrated, nontoxic.  Lungs clear to auscultation bilaterally.   Chest x-ray obtained which revealed increase opacity in the right middle lobe.  A CT was obtained to better characterize the opacity which noted to be postsurgical changes from a lobectomy.  There is concern for possible recurrence of neoplastic disease.  There was no evidence of pneumonia.  Lactic acid within normal limits.  No significant electrolyte derangements or renal insufficiency on chemistry panel. BNP WNL. Doubt HF. Doubt PE.  Currently awaiting CBC.  If stable, pt is safe for discharge home with oncology follow up.  Patient care turned over to Dr Eulis Foster at [facility]. Patient case and results discussed in detail; please see their note for further ED managment.      Final Clinical Impression(s) / ED Diagnoses Final diagnoses:  Shortness of breath      This chart was dictated using voice recognition software.  Despite best efforts to proofread,  errors can occur which can change the documentation meaning.   Fatima Blank, MD 08/12/17 [phone]

## 2017-08-11 NOTE — ED Notes (Addendum)
Pt. Documented in error DG Chest 2 view.

## 2017-08-11 NOTE — ED Notes (Signed)
Bed: WA22 Expected date:  Expected time:  Means of arrival:  Comments: Triage 3  

## 2017-08-11 NOTE — ED Notes (Signed)
WAITING FOR PT TO RETURN FORM X-RAY TO DO THE EKG

## 2017-08-11 NOTE — ED Triage Notes (Signed)
Pt stated "I'm a cancer pt.  I have lung cancer in the right,I was here last week, dx'd with pneumonia, saw my doctor's PA yesterday.  I need my throat, my ears, my head checked out.  I need to be admitted."

## 2017-08-12 ENCOUNTER — Telehealth: Payer: Self-pay | Admitting: Emergency Medicine

## 2017-08-12 ENCOUNTER — Telehealth: Payer: Self-pay | Admitting: Medical Oncology

## 2017-08-12 DIAGNOSIS — R0602 Shortness of breath: Secondary | ICD-10-CM | POA: Diagnosis not present

## 2017-08-12 LAB — CBC WITH DIFFERENTIAL/PLATELET
BASOS ABS: 0.1 10*3/uL (ref 0.0–0.1)
BASOS PCT: 0 %
EOS PCT: 5 %
Eosinophils Absolute: 0.6 10*3/uL (ref 0.0–0.7)
HCT: 35.8 % — ABNORMAL LOW (ref 36.0–46.0)
Hemoglobin: 11.6 g/dL — ABNORMAL LOW (ref 12.0–15.0)
LYMPHS PCT: 19 %
Lymphs Abs: 2.4 10*3/uL (ref 0.7–4.0)
MCH: 29.9 pg (ref 26.0–34.0)
MCHC: 32.4 g/dL (ref 30.0–36.0)
MCV: 92.3 fL (ref 78.0–100.0)
Monocytes Absolute: 0.9 10*3/uL (ref 0.1–1.0)
Monocytes Relative: 7 %
Neutro Abs: 8.5 10*3/uL — ABNORMAL HIGH (ref 1.7–7.7)
Neutrophils Relative %: 69 %
Platelets: 276 10*3/uL (ref 150–400)
RBC: 3.88 MIL/uL (ref 3.87–5.11)
RDW: 12.9 % (ref 11.5–15.5)
WBC: 12.5 10*3/uL — AB (ref 4.0–10.5)

## 2017-08-12 LAB — COMPREHENSIVE METABOLIC PANEL
ALBUMIN: 3.8 g/dL (ref 3.5–5.0)
ALK PHOS: 79 U/L (ref 38–126)
ALT: 11 U/L — AB (ref 14–54)
AST: 16 U/L (ref 15–41)
Anion gap: 11 (ref 5–15)
BUN: 18 mg/dL (ref 6–20)
CHLORIDE: 104 mmol/L (ref 101–111)
CO2: 28 mmol/L (ref 22–32)
CREATININE: 0.96 mg/dL (ref 0.44–1.00)
Calcium: 9.3 mg/dL (ref 8.9–10.3)
GFR calc non Af Amer: 57 mL/min — ABNORMAL LOW (ref >60)
GLUCOSE: 117 mg/dL — AB (ref 65–99)
Potassium: 3.5 mmol/L (ref 3.5–5.1)
Sodium: 143 mmol/L (ref 135–145)
Total Bilirubin: 0.7 mg/dL (ref 0.3–1.2)
Total Protein: 6.6 g/dL (ref 6.5–8.1)

## 2017-08-12 LAB — BRAIN NATRIURETIC PEPTIDE: B NATRIURETIC PEPTIDE 5: 33.4 pg/mL (ref 0.0–100.0)

## 2017-08-12 MED ORDER — HYDROCODONE-ACETAMINOPHEN 10-325 MG PO TABS: 1.0000 | ORAL_TABLET | Freq: Two times a day (BID) | ORAL | 0 refills | Status: DC | PRN

## 2017-08-12 NOTE — Telephone Encounter (Signed)
Spoke with pt, aware of recs.  Pt declined a home health assessment, stating that she already has someone come into the home to help her.  Pt scheduled to see SG on Wednesday to discuss hospice.  Nothing further needed at this time.

## 2017-08-12 NOTE — Telephone Encounter (Signed)
OK to refill for her

## 2017-08-12 NOTE — Telephone Encounter (Signed)
rx printed, signed, and placed in brown accordion folder for pickup.  Pt aware.  Nothing further needed.

## 2017-08-12 NOTE — ED Provider Notes (Signed)
Evaluation post return of CBC at the request of Dr. Leonette Monarch.  Patient currently being treated for pneumonia with Augmentin and had CT scan that does not indicate pneumonia.  CT scan indicates right-sided lung mass, somewhat larger, concerning for cancer.  CBC with white count 12.5, elevated, hemoglobin low 11.6.  Repeat vitals, are reassuring.  At this time the patient is comfortable.  She states she came here earlier because she is having intermittent problems breathing.  She is on oxygen at home.  She has no further complaints and understands the follow-up instructions.   Daleen Bo, MD 08/12/17 [phone]

## 2017-08-12 NOTE — Telephone Encounter (Signed)
Please let her know that I would like to perform a home health assessment to determine her home needs, what supplies or services she might need / qualify for. Also, I would be happy to talk to her about hospice services - maybe we should make an OV to do so, would allow her to get info and ask me any questions about it

## 2017-08-12 NOTE — Telephone Encounter (Signed)
.   Dr Agustina Caroli notes reviewed. Returned call  Pt reviewed events last 24 hours. Patient asked me to please ask [name] to sign off on hospice. I told her I told her Dr Lamonte Sakai was going to see her wed about hospice . I did tell her I  will call [facility] re Palliative care, not Hospice  and to go back to ED if symptoms worsen.  Referred pt to palliative care.

## 2017-08-12 NOTE — Telephone Encounter (Signed)
Spoke with pt, states that she was in ED again last night for dyspnea.  Pt is requesting hospice care.  Pt described wanting to be housed in hospice home- states that she lives alone and feels that she is no longer able to care for herself.  I tried to explain the hospice process to pt but she then became upset stating that she no longer wants to go back to the ED and needs help with her care, and that she cannot do this at home any longer.  RB please advise if you are willing to refer patient to hospice.  Thanks.

## 2017-08-15 ENCOUNTER — Telehealth: Payer: Self-pay | Admitting: Internal Medicine

## 2017-08-15 ENCOUNTER — Telehealth: Payer: Self-pay | Admitting: *Deleted

## 2017-08-15 ENCOUNTER — Telehealth: Payer: Self-pay

## 2017-08-15 NOTE — Telephone Encounter (Signed)
Xanax too soon, since #90 with 2 refills were sent mar 4 ot her pharmacy

## 2017-08-15 NOTE — Telephone Encounter (Signed)
Returned call to pt who advised Enid Derry will be at her house to discuss Palliative care tomorrow at 1:30pm No further concerns.

## 2017-08-15 NOTE — Telephone Encounter (Signed)
Ok, good to know

## 2017-08-15 NOTE — Telephone Encounter (Signed)
Copied from [place] [id number]. Topic: Quick Communication - See Telephone Encounter >> Aug 15, 2017 12:08 PM [name] wrote: CRM for notification. See Telephone encounter for: 08/15/17.  Patient said to let Dr Jenny Reichmann know to NOT write a prescription for HYDROcodone-acetaminophen (NORCO) 10-325 MG tablet. She said that Dr Lamonte Sakai gives her more & she is going to let him keep giving it to her

## 2017-08-15 NOTE — Telephone Encounter (Signed)
Pharmacy  [facility] [place]  [address] [place] [address]  Phone: [phone] Fax: [phone]   It needs to be written as 3 tablets 3 times a day

## 2017-08-15 NOTE — Telephone Encounter (Signed)
Copied from [place] [id number]. Topic: Quick Communication - See Telephone Encounter >> Aug 15, 2017 12:11 PM Vernona Rieger wrote: CRM for notification. See Telephone encounter for: 08/15/17.  Patient said that her prescription for ALPRAZolam (XANAX) 1 MG tablet "90 tablets", she said that its "60" written on the bottle and that is not enough. I advised patient that on my end it shows that Dr Jenny Reichmann prescribed her 90 tablets with 2 refills In march. She said that is in correct. Please advise.

## 2017-08-15 NOTE — Telephone Encounter (Signed)
Phone call placed to patient to introduce palliative care and to schedule visit. Patient receptive to this. Scheduled 08/16/17.

## 2017-08-16 ENCOUNTER — Telehealth: Payer: Self-pay | Admitting: Emergency Medicine

## 2017-08-16 ENCOUNTER — Other Ambulatory Visit: Payer: Medicare Other | Admitting: Internal Medicine

## 2017-08-16 ENCOUNTER — Encounter: Payer: Self-pay | Admitting: Internal Medicine

## 2017-08-16 DIAGNOSIS — R0602 Shortness of breath: Secondary | ICD-10-CM

## 2017-08-16 DIAGNOSIS — M546 Pain in thoracic spine: Secondary | ICD-10-CM

## 2017-08-16 DIAGNOSIS — G8929 Other chronic pain: Secondary | ICD-10-CM

## 2017-08-16 DIAGNOSIS — Z515 Encounter for palliative care: Secondary | ICD-10-CM

## 2017-08-16 NOTE — Telephone Encounter (Signed)
Called and spoke with [facility], he needed quantity verification. Verified. Nothing further needed.

## 2017-08-16 NOTE — Progress Notes (Signed)
Telephone call from patient stated that she is not taking the Ibuprofen/Advil that was recommended x 1 only as a test to improve inflammatory pain.  Patient will continue to take her regularly prescribed analgesics that she uses chronically for pain relief.  Gonzella Lex, NP-C

## 2017-08-17 ENCOUNTER — Ambulatory Visit: Payer: Medicare Other | Admitting: Internal Medicine

## 2017-08-17 ENCOUNTER — Ambulatory Visit: Payer: Medicare Other | Admitting: Acute Care

## 2017-08-17 ENCOUNTER — Other Ambulatory Visit: Payer: Self-pay | Admitting: Oncology

## 2017-08-17 DIAGNOSIS — Z5112 Encounter for antineoplastic immunotherapy: Secondary | ICD-10-CM

## 2017-08-17 DIAGNOSIS — C3411 Malignant neoplasm of upper lobe, right bronchus or lung: Secondary | ICD-10-CM

## 2017-08-17 DIAGNOSIS — R5382 Chronic fatigue, unspecified: Secondary | ICD-10-CM

## 2017-08-17 LAB — CULTURE, BLOOD (ROUTINE X 2)
Culture: NO GROWTH
Culture: NO GROWTH
Special Requests: ADEQUATE
Special Requests: ADEQUATE

## 2017-08-18 ENCOUNTER — Telehealth: Payer: Self-pay

## 2017-08-18 ENCOUNTER — Inpatient Hospital Stay: Payer: Medicare Other | Admitting: Oncology

## 2017-08-18 ENCOUNTER — Inpatient Hospital Stay: Payer: Medicare Other

## 2017-08-18 ENCOUNTER — Inpatient Hospital Stay: Payer: Medicare Other | Attending: Medical

## 2017-08-18 ENCOUNTER — Telehealth: Payer: Self-pay | Admitting: Emergency Medicine

## 2017-08-18 DIAGNOSIS — R0602 Shortness of breath: Secondary | ICD-10-CM | POA: Insufficient documentation

## 2017-08-18 DIAGNOSIS — J449 Chronic obstructive pulmonary disease, unspecified: Secondary | ICD-10-CM | POA: Insufficient documentation

## 2017-08-18 DIAGNOSIS — F419 Anxiety disorder, unspecified: Secondary | ICD-10-CM | POA: Insufficient documentation

## 2017-08-18 DIAGNOSIS — K130 Diseases of lips: Secondary | ICD-10-CM | POA: Insufficient documentation

## 2017-08-18 DIAGNOSIS — C3411 Malignant neoplasm of upper lobe, right bronchus or lung: Secondary | ICD-10-CM | POA: Insufficient documentation

## 2017-08-18 NOTE — Progress Notes (Signed)
PALLIATIVE CARE CONSULT VISIT   PATIENT NAME: [name] DOB: Oct 02, 1945 MRN: [id number]  PRIMARY CARE PROVIDER:   [name], MD  REFERRING PROVIDER:      [name], MD [place], [place] [address]  RESPONSIBLE PARTY:   Self   RECOMMENDATIONS and PLAN:  1. Shortness of breath R06.02:  Chronic and multifactorial.  Increase nebulizer treatments from qday to 2-4 times per day.  Continue Ventolin inhaler q4Hrs prn.  Add Mucinex BID.  Instructed to incorporate use of pulse oximeter.  Seek emergency treatment if no improvement or worsening of symptoms after performing home therapies. Keep FU appt. With Pulmonologist.  Discuss potential prognosis with Pulmonologist and Oncologist.  2. Chronic right-sided thoracic back pain M54.6:   Appears to pleuritic and arthritic pain. Tylenol and heating pad prn.  May take 1 Advil 286m after a meal as a trial dose. Discuss with Pulmonologist during FU.  3.  Palliative care encounter:  Explanation of Palliative vs Hospice care.  Pt. Is still interested in receiving immunotherapy treatments as previously scheduled.  She does not plan on having it this week due to current illness.   Reviewed advanced care planning with pt. and son.  She was not able to make any decisions related to code status and MOST form.  She will remain a FULL CODE at this time.  Plan on readdressing ACP during next visit in one month after she and her children have joint discussion.  Palliative care will continue to follow.  Next visit will be at her daughter's home where pt. Will be moving to in early June.   I spent 90 minutes providing this consultation,  from 1:00pm to 2:pm at home. More than 50% of the time in this consultation was spent coordinating communication with pt, son, daughter and caregiver.   HISTORY OF PRESENT ILLNESS:  BSHANINE KREIGERis a 72y.o. year old female with multiple medical problems including COPD, long term use of oxygen, L lung  cancer(3104yrago) and R lung cancer.  She reports receipt of immunotherapy for the R lung cancer and has no current plans of discontinuing treatments. She is completing treatment for a suspected pneumonia. She does report increased pulmonary congestion with a productive cough, intermittent "inflammatory pain" of the R posterior back.  She resides at home and her as assistance of her ADLs by a caregiver, son and daughter. She is continent and feeds self.  Palliative Care was asked to help address goals of care.   CODE STATUS: FULL CODE  PPS: 50% HOSPICE ELIGIBILITY/DIAGNOSIS: Not at this time.  Pt choses to continue immunotherapy tx.  PAST MEDICAL HISTORY:  Past Medical History:  Diagnosis Date  . [place], [place] ([facility]   upper left lobe  . Anemia in neoplastic disease   . Anxiety   . Arthritis    "all over my body"  . Asthma   . Asymptomatic varicose veins   . Atherosclerosis of native arteries of the extremities, unspecified   . Atrial fibrillation ([facility]  . Bronchial pneumonia    history  . 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 Chemotherapy adverse reaction 09/15/11   "makes me light headed and pass out; this is 3rd blood transfusion since going thru it"  . CHF (congestive heart failure) ([facility]  . Chronic airway obstruction, not elsewhere classified   . Chronic fatigue 08/19/2015  . Chronic sinusitis   . Complication  of anesthesia    slow to wake up  . COPD with emphysema ([place])   . Coronary artery disease    Dr. Burt Knack had stress test done  . DDD (degenerative disc disease), lumbar   . Depressive disorder, not elsewhere classified   . Diseases of lips   . Disorder of bone and cartilage, unspecified   . DVT (deep venous thrombosis) ([place])   . Fibromyalgia   . Functional urinary incontinence   . GERD (gastroesophageal reflux disease)   . Hard of hearing, right   . Herpes zoster without mention of complication   . Hip pain, chronic 09/17/2014  . History of blood clots      "2 in my aorta"  . History of blood transfusion   . History of bronchitis   . History of echocardiogram    Echo 11/18: mild LVH, EF 60-65, no RWMA, Gr 1 DD, Ao Sclerosis, mild AI, RVH with low nl RSVF, trivial eff  . Hx of radiation therapy 04/28/11 -06/08/11   right upper lobe-lung  . Hyperlipidemia   . Hypertension   . Kidney infection    "related to chemo"  . Lumbago   . Malignant neoplasm of bronchus and lung, unspecified site   . Mental disorder   . Muscle weakness (generalized)   . Non-small cell lung cancer ([place]) 03/31/2011   Adenocarcinoma Rt upper lobe mass  . Osteoarthrosis, unspecified whether generalized or localized, unspecified site   . Osteoporosis   . Other abnormal blood chemistry   . Other and unspecified hyperlipidemia   . PAD (peripheral artery disease) ([place])   . Pain in thoracic spine   . 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 Primary cancer of right upper lobe of lung ([place]) 02/08/2011   Biopsy Rt upper lobe mass 03/31/11>>adenocarcinoma DIAGNOSIS: Stage IIB/IIIA non-small cell lung cancer consistent with adenocarcinoma diagnosed in December 2012.   PRIOR THERAPY:  1) Status post radiation therapy to the right upper lobe lung mass completed on 06/04/2011 under the care of Dr. Pablo Ledger.  2) Systemic chemotherapy with carboplatin for AUC of 5 and Alimta 500 mg/M2 giving every 3   . Reflux   . Reflux esophagitis   . Respiratory failure with hypoxia 01/02/2010  . Restless legs syndrome (RLS)   . Scoliosis   . Screening for thyroid disorder   . Shortness of breath    "anytime really"  . Sinus headache    "all the time"  . Spasm of muscle   . Syncope and collapse   . Type II or unspecified type diabetes mellitus with neurological manifestations, uncontrolled(250.62)   . Type II or unspecified type diabetes mellitus without mention of complication, not stated as uncontrolled   . Type II or unspecified type diabetes mellitus without mention of complication, not  stated as uncontrolled   . Unspecified constipation   . Unspecified essential hypertension   . Unspecified hearing loss   . Unspecified hereditary and idiopathic peripheral neuropathy   . Unspecified vitamin D deficiency     SOCIAL HX:  Social History   Tobacco Use  . Smoking status: Former Smoker    Packs/day: 1.00    Years: 40.00    Pack years: 40.00    Types: Cigarettes    Last attempt to quit: 02/10/1998    Years since quitting: 19.5  . Smokeless tobacco: Never Used  Substance Use Topics  . Alcohol use: No    ALLERGIES:  Allergies  Allergen Reactions  . Iohexol Shortness Of Breath  and Other (See Comments)    "burns me up inside"    Pt does fine with 13 hour prep  01/17/12  . Other     CAN'T STAND THE SMELL OF PURE BLEACH; "HAVE TO BACK UP AND POUR AND DON'T BREATH IT TIL i GET CAP BACK ON; DILUTE IT TO SPRAY"  . Anoro Ellipta [Umeclidinium-Vilanterol] Swelling    Swelling of lips and mouth  . Codeine Itching and Nausea And Vomiting    REACTION: GI upset  . Protonix [Pantoprazole Sodium] Diarrhea     PERTINENT MEDICATIONS:  Outpatient Encounter Medications as of 08/16/2017  Medication Sig  . ACCU-CHEK SOFTCLIX LANCETS lancets by Other route. Check blood sugar twice daily. Dx: E11.9, E11.49  . albuterol (PROVENTIL HFA;VENTOLIN HFA) 108 (90 Base) MCG/ACT inhaler Inhale 2 puffs into the lungs every 6 (six) hours as needed for wheezing or shortness of breath.  Marland Kitchen albuterol (PROVENTIL) (2.5 MG/3ML) 0.083% nebulizer solution USE 1 VIAL VIA NEBULIZER EVERY 6 HOURS AS NEEDED FOR WHEEZING OR SHORTNESS OF BREATH  . ALPRAZolam (XANAX) 1 MG tablet Take 1 tablet (1 mg total) by mouth 3 (three) times daily as needed. for anxiety (Patient taking differently: Take 1 mg by mouth 3 (three) times daily as needed for anxiety. for anxiety)  . amoxicillin-clavulanate (AUGMENTIN) 875-125 MG tablet Take 1 tablet by mouth 2 (two) times daily. (Patient not taking: Reported on 08/11/2017)  . aspirin 81  MG tablet Take 81 mg by mouth daily.  Marland Kitchen atorvastatin (LIPITOR) 20 MG tablet Take 1 tablet (20 mg total) by mouth daily.  . Blood Glucose Monitoring Suppl (ACCU-CHEK AVIVA PLUS) w/Device KIT Use as directed to check blood sugar Dx: E11.9, E11.49  . Calcium Carbonate-Vit D-Min (CALTRATE PLUS PO) Take 1 tablet by mouth daily.  . fluticasone (FLONASE) 50 MCG/ACT nasal spray SHAKE LIQUID WELL AND USE 2 SPRAYS IN EACH NOSTRIL TWICE DAILY (Patient taking differently: SHAKE LIQUID WELL AND USE 2 SPRAYS IN EACH NOSTRIL DAILY)  . Fluticasone-Umeclidin-Vilant (TRELEGY ELLIPTA) 100-62.5-25 MCG/INH AEPB Inhale 1 puff daily into the lungs.  . folic acid (FOLVITE) 1 MG tablet Take 1 tablet (1 mg total) by mouth daily. (Patient not taking: Reported on 08/11/2017)  . guaiFENesin (MUCINEX) 600 MG 12 hr tablet Take 600 mg by mouth 2 (two) times daily as needed for cough.  Marland Kitchen HYDROcodone-acetaminophen (NORCO) 10-325 MG tablet Take 1 tablet by mouth 2 (two) times daily as needed.  . loratadine (CLARITIN) 10 MG tablet TAKE 1 TABLET BY MOUTH EVERY DAY  . metFORMIN (GLUCOPHAGE) 500 MG tablet TAKE 1 TABLET BY MOUTH EVERY MORNING AS NEEDED FOR BLOOD SUGAR OVER 160 AND 1 TABLET BY MOUTH EVERY EVENING  . omeprazole (PRILOSEC) 20 MG capsule Take 1 capsule (20 mg total) by mouth daily.  . OXYGEN Inhale 4 L into the lungs.  . potassium chloride (K-DUR,KLOR-CON) 10 MEQ tablet Take 1 tablet (10 mEq total) daily by mouth. (Patient not taking: Reported on 08/11/2017)  . potassium chloride (K-DUR,KLOR-CON) 10 MEQ tablet TAKE 1 TABLET BY MOUTH ONCE DAILY  . predniSONE (STERAPRED UNI-PAK 21 TAB) 10 MG (21) TBPK tablet Take by mouth daily. Take 46m X 2 days, 248mX 2, 1038m 2 then stop (Patient not taking: Reported on 08/11/2017)  . senna (SENOKOT) 8.6 MG tablet Take 1 tablet by mouth 2 (two) times daily as needed for constipation.   . traMADol (ULTRAM) 50 MG tablet TAKE 2 TABLETS (100MG TOTAL) BY MOUTH EVERY 6 HOURS AS NEEDED FOR SEVERE PAIN   .  triamterene-hydrochlorothiazide (MAXZIDE-25) 37.5-25 MG tablet Take 1 tablet by mouth daily.   Marland Kitchen UNABLE TO FIND Med Name: Cranial Prothesis for Chemotherapy  . VENTOLIN HFA 108 (90 Base) MCG/ACT inhaler INHALE 2 PUFFS BY MOUTH EVERY 6 HOURS AS NEEDED FOR WHEEZING OR SHORTNESS OF BREATH   Facility-Administered Encounter Medications as of 08/16/2017  Medication  . hyaluronate sodium (RADIAPLEXRX) gel  . sodium chloride flush (NS) 0.9 % injection 10 mL  . topical emolient (BIAFINE) emulsion    PHYSICAL EXAM:   General: Well nourished elderly female resting in recliner.  In NAD Cardiovascular: regular rate and rhythm Pulmonary: Clear with diminished breath sounds of bases.  O2 via [place] in use.  Abdomen: soft, nontender, + bowel sounds Extremities: no edema or ecchymosis Skin: Exposed skin is intact Neurological: Alert and oriented x 3.  Generalized weakness Psych:  Slightly anxious and cooperative.   Gonzella Lex, NP-C

## 2017-08-18 NOTE — Telephone Encounter (Signed)
Received a phone call from patient who would like to speak with Enid Derry NP regarding medication questions.

## 2017-08-18 NOTE — Telephone Encounter (Signed)
Called and spoke to [place] with [employer]. Quita Skye stated that he called in yesterday to verify Rx for Norco 10mg . Rx for Norco 10mg  was writing on 08/12/17 for 90 tabs with a note that original Rx was to be 60 tabs. 30 was sent, sending second half of 30 tabs.  Quita Skye stated he was advised yesterday by our office that Rx should had been written for 30 tablets. Per 08/10/17 phone note- RB okayed to refill Norco 10-325 #90 tablets.  RB please advise if okay to print Rx for additional 60 tabs, as only 30 tablets were giving to pt.

## 2017-08-19 ENCOUNTER — Telehealth: Payer: Self-pay | Admitting: Internal Medicine

## 2017-08-19 MED ORDER — HYDROCODONE-ACETAMINOPHEN 10-325 MG PO TABS: 1.0000 | ORAL_TABLET | Freq: Two times a day (BID) | ORAL | 0 refills | Status: DC | PRN

## 2017-08-19 NOTE — Telephone Encounter (Signed)
Yes I am Ok with giving the additional 60

## 2017-08-19 NOTE — Telephone Encounter (Signed)
I spoke with patient and she stated that she will not and has not taken the trial dose tablet of Advil/Ibuprofen.  She will continue to take her routine Rx of Hydrocodone for back pain.  Advil/Ibuprofen is not on medication list. This provider will not be filling or refilling any prescriptions. Gonzella Lex, NP-C

## 2017-08-19 NOTE — Telephone Encounter (Addendum)
Rx printed and placed into RB to sign folder. I will call pt once signed.   I did advise pt it would be Monday but she stated she had enough for the weekend.

## 2017-08-23 NOTE — Telephone Encounter (Signed)
Rx has been signed and returned to me. Pt is aware that this is ready for pick up. Rx has been placed up front for pick up. Nothing further was needed.

## 2017-08-24 ENCOUNTER — Ambulatory Visit: Payer: Medicare Other | Admitting: Acute Care

## 2017-08-29 ENCOUNTER — Telehealth: Payer: Self-pay | Admitting: Emergency Medicine

## 2017-08-29 MED ORDER — NYSTATIN 100000 UNIT/ML MT SUSP: OROMUCOSAL | 0 refills | Status: DC

## 2017-08-29 NOTE — Telephone Encounter (Signed)
Spoke with pt, Dr Lamonte Sakai states we could send in Nystatin. Rx sent in to [facility]. Nothing further is needed.

## 2017-08-29 NOTE — Telephone Encounter (Signed)
Called and spoke to pt.  Pt states since being on abx prescribed on 08/04/17, her mouth is raw, dry and cracked on the corner on her lips.  Pt states that she also has one white patch on the inside of her mouth. Pt is requesting recommendations.   RB please advise. Thanks

## 2017-08-30 ENCOUNTER — Telehealth: Payer: Self-pay | Admitting: Internal Medicine

## 2017-08-31 ENCOUNTER — Telehealth: Payer: Self-pay | Admitting: Internal Medicine

## 2017-08-31 MED ORDER — ALPRAZOLAM 1 MG PO TABS: ORAL_TABLET | ORAL | 2 refills | Status: DC

## 2017-08-31 NOTE — Telephone Encounter (Signed)
Patient states she take 3 tablets daily, please advise. States she is suppose to get 90 tablets a month. Call back [phone]

## 2017-08-31 NOTE — Telephone Encounter (Signed)
Pt son came in and states his mother cannot wait until the 2nd her anxiety is really bad. I asked how often she is taking it and he said most of the time 3x a day. I informed him since it is prescribed for 2x a day that's make her due early for the medicine.  He understood but does not believe she can wait that long. Can her new med be prescribed for 3x a day so the same thing does not happen next time. Please call son back.

## 2017-08-31 NOTE — Telephone Encounter (Addendum)
Very sorry, I declines the change, as I will not treat anxiety with high dose xanax (controlled substance) that can only lead to dependence and withdrawal reactions at some point  I can refer to counseling or psychiatry if needed, but I will not increase the rx

## 2017-08-31 NOTE — Telephone Encounter (Signed)
08/22/2017 60# 

## 2017-08-31 NOTE — Telephone Encounter (Signed)
Done, with date of refill September 13, 2017  We cannot authorize early refills; pt was given 90 days total tx on Jun 15 2017

## 2017-09-01 ENCOUNTER — Encounter: Payer: Self-pay | Admitting: Oncology

## 2017-09-01 ENCOUNTER — Inpatient Hospital Stay: Payer: Medicare Other

## 2017-09-01 ENCOUNTER — Inpatient Hospital Stay (HOSPITAL_BASED_OUTPATIENT_CLINIC_OR_DEPARTMENT_OTHER): Payer: Medicare Other | Admitting: Oncology

## 2017-09-01 ENCOUNTER — Telehealth: Payer: Self-pay | Admitting: Internal Medicine

## 2017-09-01 VITALS — BP 126/58 | HR 108 | Temp 98.8°F | Resp 18 | Ht 65.0 in | Wt 176.9 lb

## 2017-09-01 DIAGNOSIS — F419 Anxiety disorder, unspecified: Secondary | ICD-10-CM

## 2017-09-01 DIAGNOSIS — R5382 Chronic fatigue, unspecified: Secondary | ICD-10-CM

## 2017-09-01 DIAGNOSIS — R0602 Shortness of breath: Secondary | ICD-10-CM

## 2017-09-01 DIAGNOSIS — C3411 Malignant neoplasm of upper lobe, right bronchus or lung: Secondary | ICD-10-CM

## 2017-09-01 DIAGNOSIS — J449 Chronic obstructive pulmonary disease, unspecified: Secondary | ICD-10-CM

## 2017-09-01 DIAGNOSIS — Z5112 Encounter for antineoplastic immunotherapy: Secondary | ICD-10-CM

## 2017-09-01 DIAGNOSIS — K130 Diseases of lips: Secondary | ICD-10-CM | POA: Diagnosis not present

## 2017-09-01 DIAGNOSIS — Z7189 Other specified counseling: Secondary | ICD-10-CM

## 2017-09-01 LAB — CMP (CANCER CENTER ONLY)
ALT: 8 U/L (ref 0–55)
AST: 15 U/L (ref 5–34)
Albumin: 3.5 g/dL (ref 3.5–5.0)
Alkaline Phosphatase: 88 U/L (ref 40–150)
Anion gap: 10 (ref 3–11)
BILIRUBIN TOTAL: 0.6 mg/dL (ref 0.2–1.2)
BUN: 8 mg/dL (ref 7–26)
CALCIUM: 9.4 mg/dL (ref 8.4–10.4)
CHLORIDE: 99 mmol/L (ref 98–109)
CO2: 30 mmol/L — ABNORMAL HIGH (ref 22–29)
CREATININE: 0.85 mg/dL (ref 0.60–1.10)
GFR, Est AFR Am: >60 mL/min (ref >60)
GFR, Estimated: >60 mL/min (ref >60)
GLUCOSE: 126 mg/dL (ref 70–140)
POTASSIUM: 3.1 mmol/L — AB (ref 3.5–5.1)
Sodium: 139 mmol/L (ref 136–145)
TOTAL PROTEIN: 6.4 g/dL (ref 6.4–8.3)

## 2017-09-01 LAB — CBC WITH DIFFERENTIAL (CANCER CENTER ONLY)
BASOS ABS: 0 10*3/uL (ref 0.0–0.1)
BASOS PCT: 0 %
EOS ABS: 0.4 10*3/uL (ref 0.0–0.5)
Eosinophils Relative: 5 %
HCT: 33.6 % — ABNORMAL LOW (ref 34.8–46.6)
Hemoglobin: 11 g/dL — ABNORMAL LOW (ref 11.6–15.9)
Lymphocytes Relative: 13 %
Lymphs Abs: 1.1 10*3/uL (ref 0.9–3.3)
MCH: 29.6 pg (ref 25.1–34.0)
MCHC: 32.7 g/dL (ref 31.5–36.0)
MCV: 90.6 fL (ref 79.5–101.0)
MONO ABS: 0.7 10*3/uL (ref 0.1–0.9)
MONOS PCT: 9 %
Neutro Abs: 6 10*3/uL (ref 1.5–6.5)
Neutrophils Relative %: 73 %
Platelet Count: 286 10*3/uL (ref 145–400)
RBC: 3.71 MIL/uL (ref 3.70–5.45)
RDW: 13.1 % (ref 11.2–14.5)
WBC Count: 8.3 10*3/uL (ref 3.9–10.3)

## 2017-09-01 LAB — TSH: TSH: 1.362 u[IU]/mL (ref 0.308–3.960)

## 2017-09-01 MED ORDER — NYSTATIN 100000 UNIT/GM EX CREA: 1.0000 "application " | TOPICAL_CREAM | Freq: Two times a day (BID) | CUTANEOUS | 0 refills | Status: DC

## 2017-09-01 NOTE — Progress Notes (Signed)
[facility] OFFICE PROGRESS NOTE  [name], MD [place] [address]  DIAGNOSIS: Recurrent non-small cell lung cancer initially diagnosed as a stage IIA adenocarcinoma in December 2012 with no actionable mutations.   PRIOR THERAPY: 1) Status post radiation therapy to the right upper lobe lung mass completed on 06/04/2011 under the care of Dr. Pablo Ledger.  2) Systemic chemotherapy with carboplatin for AUC of 5 and Alimta 500 mg/M2 giving every 3 weeks, status post 3 cycles, last dose was given on 07/21/2011.  3) Systemic chemotherapy with carboplatin for AUC of 5 and Alimta 500 MG/M2 every 3 weeks. First dose on 01/02/2015. Status post 2 cycles, last dose was given 01/30/2015. Her treatment is currently on hold secondary to intolerance. 4) Immunotherapy with Nivolumab 240 M IV every 2 weeks status post 14 cycles.  5)immunotherapy with Nivolumab 480 mg IV every 4 weeks. Patient is status post 1 cycle. The patient discontinued treatment with Nivolumab every 4 weeks because she want to switch back to every 2 weeks due to increased side effects with the monthly dosing. 6) The patient resumed her treatment again with immunotherapy with Nivolumab 240 mg IV every 2 weeks. First dose 09/15/2016. Status post 8 cycles.  CURRENT THERAPY: Observation.  INTERVAL HISTORY: [name] 72 y.o. female returns for routine follow-up visit accompanied by her son.  The patient has missed her last 2 appointments.  Her last treatment was given on 07/21/2017.  Since her last visit, the patient has been seen in the emergency room twice.  During her first visit she was diagnosed with pneumonia and placed on antibiotics.  She continued to have shortness of breath and was reevaluated in the emergency room.  No specific findings were found at the second emergency room visit.  The patient continues to have fatigue and shortness of breath. The patient's son thinks that some of her  issues are related to anxiety and her COPD.  The patient denies fevers and chills.  Denies chest pain, cough, hemoptysis.  She has ongoing shortness of breath and wears home oxygen.  She is using nebulizers and inhalers at home.  Denies nausea, vomiting, constipation, diarrhea.  Reports pain to the bilateral corners of her mouth.  Reports lower extremity edema.  The patient was recently seen by outpatient palliative care who is trying to help manage her shortness of breath.  The patient will be moving in with her daughter in the near future because she needs more assistance.  The patient is due to follow-up with pulmonology next week.  The patient had a CT scan of the chest during one of her emergency room visits and is here to discuss the results.  MEDICAL HISTORY: Past Medical History:  Diagnosis Date  . Adenocarcinoma, lung ([facility])    upper left lobe  . Anemia in neoplastic disease   . Anxiety   . Arthritis    "all over my body"  . Asthma   . Asymptomatic varicose veins   . Atherosclerosis of native arteries of the extremities, unspecified   . Atrial fibrillation ([place])   . Bronchial pneumonia    history  . 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 Chemotherapy adverse reaction 09/15/11   "makes me light headed and pass out; this is 3rd blood transfusion since going thru it"  . CHF (congestive heart failure) ([facility])   . Chronic airway obstruction, not elsewhere classified   . Chronic fatigue 08/19/2015  .  Chronic sinusitis   . Complication of anesthesia    slow to wake up  . 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 Coronary artery disease    Dr. Burt Knack had stress test done  . DDD (degenerative disc disease), lumbar   . Depressive disorder, not elsewhere classified   . Diseases of lips   . Disorder of bone and cartilage, unspecified   . DVT (deep venous thrombosis) ([place])   . Fibromyalgia   . Functional urinary incontinence   . GERD (gastroesophageal reflux disease)   . Hard of hearing, right   .  Herpes zoster without mention of complication   . Hip pain, chronic 09/17/2014  . History of blood clots    "2 in my aorta"  . History of blood transfusion   . History of bronchitis   . History of echocardiogram    Echo 11/18: mild LVH, EF 60-65, no RWMA, Gr 1 DD, Ao Sclerosis, mild AI, RVH with low nl RSVF, trivial eff  . Hx of radiation therapy 04/28/11 -06/08/11   right upper lobe-lung  . Hyperlipidemia   . Hypertension   . Kidney infection    "related to chemo"  . Lumbago   . Malignant neoplasm of bronchus and lung, unspecified site   . Mental disorder   . Muscle weakness (generalized)   . Non-small cell lung cancer ([facility]) 03/31/2011   Adenocarcinoma Rt upper lobe mass  . Osteoarthrosis, unspecified whether generalized or localized, unspecified site   . Osteoporosis   . Other abnormal blood chemistry   . Other and unspecified hyperlipidemia   . PAD (peripheral artery disease) ([place])   . Pain in thoracic spine   . 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 Primary cancer of right upper lobe of lung ([place]) 02/08/2011   Biopsy Rt upper lobe mass 03/31/11>>adenocarcinoma DIAGNOSIS: Stage IIB/IIIA non-small cell lung cancer consistent with adenocarcinoma diagnosed in December 2012.   PRIOR THERAPY:  1) Status post radiation therapy to the right upper lobe lung mass completed on 06/04/2011 under the care of Dr. Pablo Ledger.  2) Systemic chemotherapy with carboplatin for AUC of 5 and Alimta 500 mg/M2 giving every 3   . Reflux   . Reflux esophagitis   . Respiratory failure with hypoxia 01/02/2010  . Restless legs syndrome (RLS)   . Scoliosis   . Screening for thyroid disorder   . Shortness of breath    "anytime really"  . Sinus headache    "all the time"  . Spasm of muscle   . Syncope and collapse   . Type II or unspecified type diabetes mellitus with neurological manifestations, uncontrolled(250.62)   . Type II or unspecified type diabetes mellitus without mention of complication, not  stated as uncontrolled   . Type II or unspecified type diabetes mellitus without mention of complication, not stated as uncontrolled   . Unspecified constipation   . Unspecified essential hypertension   . Unspecified hearing loss   . Unspecified hereditary and idiopathic peripheral neuropathy   . Unspecified vitamin D deficiency     ALLERGIES:  is allergic to iohexol; other; anoro ellipta [umeclidinium-vilanterol]; codeine; and protonix [pantoprazole sodium].  MEDICATIONS:  Current Outpatient Medications  Medication Sig Dispense Refill  . ACCU-CHEK SOFTCLIX LANCETS lancets by Other route. Check blood sugar twice daily. Dx: E11.9, E11.49    . albuterol (PROVENTIL HFA;VENTOLIN HFA) 108 (90 Base) MCG/ACT inhaler Inhale 2 puffs into the lungs every 6 (six) hours as needed for wheezing or shortness of breath. 1 Inhaler  6  . albuterol (PROVENTIL) (2.5 MG/3ML) 0.083% nebulizer solution USE 1 VIAL VIA NEBULIZER EVERY 6 HOURS AS NEEDED FOR WHEEZING OR SHORTNESS OF BREATH 75 mL 3  . ALPRAZolam (XANAX) 1 MG tablet TAKE 1 TABLET BY MOUTH 2 TIMES A DAY AS NEEDED FOR ANXIETY 60 tablet 2  . aspirin 81 MG tablet Take 81 mg by mouth daily.    Marland Kitchen atorvastatin (LIPITOR) 20 MG tablet Take 1 tablet (20 mg total) by mouth daily. 90 tablet 3  . Blood Glucose Monitoring Suppl (ACCU-CHEK AVIVA PLUS) w/Device KIT Use as directed to check blood sugar Dx: E11.9, E11.49 1 kit 0  . Calcium Carbonate-Vit D-Min (CALTRATE PLUS PO) Take 1 tablet by mouth daily.    . fluticasone (FLONASE) 50 MCG/ACT nasal spray SHAKE LIQUID WELL AND USE 2 SPRAYS IN EACH NOSTRIL TWICE DAILY (Patient taking differently: SHAKE LIQUID WELL AND USE 2 SPRAYS IN EACH NOSTRIL DAILY) 16 g 5  . Fluticasone-Umeclidin-Vilant (TRELEGY ELLIPTA) 100-62.5-25 MCG/INH AEPB Inhale 1 puff daily into the lungs. 1 each 6  . guaiFENesin (MUCINEX) 600 MG 12 hr tablet Take 600 mg by mouth 2 (two) times daily as needed for cough.    Marland Kitchen HYDROcodone-acetaminophen (NORCO)  10-325 MG tablet Take 1 tablet by mouth 2 (two) times daily as needed. 60 tablet 0  . loratadine (CLARITIN) 10 MG tablet TAKE 1 TABLET BY MOUTH EVERY DAY 30 tablet 5  . metFORMIN (GLUCOPHAGE) 500 MG tablet TAKE 1 TABLET BY MOUTH EVERY MORNING AS NEEDED FOR BLOOD SUGAR OVER 160 AND 1 TABLET BY MOUTH EVERY EVENING 60 tablet 1  . nystatin (MYCOSTATIN) 100000 UNIT/ML suspension Swish and swallow 58m four times daily x5-7days 140 mL 0  . omeprazole (PRILOSEC) 20 MG capsule Take 1 capsule (20 mg total) by mouth daily. 90 capsule 3  . OXYGEN Inhale 4 L into the lungs.    . potassium chloride (K-DUR,KLOR-CON) 10 MEQ tablet TAKE 1 TABLET BY MOUTH ONCE DAILY 30 tablet 5  . senna (SENOKOT) 8.6 MG tablet Take 1 tablet by mouth 2 (two) times daily as needed for constipation.     . traMADol (ULTRAM) 50 MG tablet TAKE 2 TABLETS (100MG TOTAL) BY MOUTH EVERY 6 HOURS AS NEEDED FOR SEVERE PAIN 90 tablet 2  . triamterene-hydrochlorothiazide (MAXZIDE-25) 37.5-25 MG tablet Take 1 tablet by mouth daily.     .Marland KitchenUNABLE TO FIND Med Name: Cranial Prothesis for Chemotherapy 1 each 0  . VENTOLIN HFA 108 (90 Base) MCG/ACT inhaler INHALE 2 PUFFS BY MOUTH EVERY 6 HOURS AS NEEDED FOR WHEEZING OR SHORTNESS OF BREATH 18 each 4  . amoxicillin-clavulanate (AUGMENTIN) 875-125 MG tablet Take 1 tablet by mouth 2 (two) times daily. (Patient not taking: Reported on 08/11/2017) 14 tablet 0  . folic acid (FOLVITE) 1 MG tablet Take 1 tablet (1 mg total) by mouth daily. (Patient not taking: Reported on 08/11/2017) 30 tablet 4  . furosemide (LASIX) 40 MG tablet Take 1 tablet (40 mg total) by mouth daily for 3 doses. 3 tablet 0  . nystatin cream (MYCOSTATIN) Apply 1 application topically 2 (two) times daily. Apply to corners of mouth twice a day as needed. 30 g 0  . potassium chloride (K-DUR,KLOR-CON) 10 MEQ tablet Take 1 tablet (10 mEq total) daily by mouth. (Patient not taking: Reported on 08/11/2017) 90 tablet 3  . predniSONE (STERAPRED UNI-PAK 21  TAB) 10 MG (21) TBPK tablet Take by mouth daily. Take 430mX 2 days, 2029m 2, 84m5m2 then  stop (Patient not taking: Reported on 08/11/2017) 14 tablet 0   No current facility-administered medications for this visit.    Facility-Administered Medications Ordered in Other Visits  Medication Dose Route Frequency Provider Last Rate Last Dose  . hyaluronate sodium (RADIAPLEXRX) gel   Topical Once Thea Silversmith, MD      . topical emolient (BIAFINE) emulsion   Topical PRN Thea Silversmith, MD        SURGICAL HISTORY:  Past Surgical History:  Procedure Laterality Date  . BRONCHOSCOPY  02/2011  . CARDIAC CATHETERIZATION    . CATARACT EXTRACTION  03/12/2013   Left Eye   . DILATION AND CURETTAGE OF UTERUS  1980's  . EYE SURGERY Left 2014   Cataract  . FETAL BLOOD TRANSFUSION  09/16/11  . JOINT REPLACEMENT  04/13/10   Left Hip  . LUNG LOBECTOMY  1986   left  . SEPTOPLASTY    . TOTAL HIP ARTHROPLASTY  01/2010   left  . TUBAL LIGATION  1980's    REVIEW OF SYSTEMS:   Review of Systems  Constitutional: Negative for appetite change, chills, fever and unexpected weight change. Positive for fatigue. HENT:   Negative for nosebleeds, sore throat and trouble swallowing.  Positive for pain and sores at the corners of her mouth.  Eyes: Negative for eye problems and icterus.  Respiratory: Negative for cough, hemoptysis, and wheezing.  Positive for shortness of breath.  Cardiovascular: Negative for chest pain. Positive for lower extremity edema. Gastrointestinal: Negative for abdominal pain, constipation, diarrhea, nausea and vomiting.  Genitourinary: Negative for bladder incontinence, difficulty urinating, dysuria, frequency and hematuria.   Musculoskeletal: Negative for neck pain and neck stiffness. Positive for back pain. Skin: Negative for itching and rash.  Neurological: Negative for dizziness, extremity weakness, headaches, light-headedness and seizures.  Hematological: Negative for adenopathy.  Does not bruise/bleed easily.  Psychiatric/Behavioral: Negative for confusion, depression and sleep disturbance.  Positive for anxiety.     PHYSICAL EXAMINATION:  Blood pressure (!) 126/58, pulse (!) 108, temperature 98.8 F (37.1 C), temperature source Oral, resp. rate 18, height 5' 5"  (1.651 m), weight 176 lb 14.4 oz (80.2 kg), SpO2 100 %.  ECOG PERFORMANCE STATUS: 1 - Symptomatic but completely ambulatory  Physical Exam  Constitutional: Oriented to person, place, and time. No distress.  HENT:  Head: Normocephalic and atraumatic.  Mouth/Throat: Oropharynx is clear and moist. No oropharyngeal exudate. Angular reticulosis noted to her mouth. Eyes: Conjunctivae are normal. Right eye exhibits no discharge. Left eye exhibits no discharge. No scleral icterus.  Neck: Normal range of motion. Neck supple.  Cardiovascular: Normal rate, regular rhythm, normal heart sounds and intact distal pulses.   Pulmonary/Chest: Effort normal. No respiratory distress. No wheezes. No rales. Diminished breath sounds bilaterally. Abdominal: Soft. Bowel sounds are normal. Exhibits no distension and no mass. There is no tenderness.  Musculoskeletal: Normal range of motion.  1+ edema to the bilateral lower extremities.  Lymphadenopathy:    No cervical adenopathy.  Neurological: Alert and oriented to person, place, and time. Exhibits normal muscle tone. Coordination normal.  Skin: Skin is warm and dry. No rash noted. Not diaphoretic. No erythema. No pallor.  Psychiatric: Mood, memory and judgment normal. Anxious at times. Vitals reviewed.  LABORATORY DATA: Lab Results  Component Value Date   WBC 8.3 09/01/2017   HGB 11.0 (L) 09/01/2017   HCT 33.6 (L) 09/01/2017   MCV 90.6 09/01/2017   PLT 286 09/01/2017      Chemistry      Component  Value Date/Time   NA 139 09/01/2017 1318   NA 141 04/07/2017 1016   K 3.1 (L) 09/01/2017 1318   K 3.4 (L) 04/07/2017 1016   CL 99 09/01/2017 1318   CL 99 07/21/2012 1125    CO2 30 (H) 09/01/2017 1318   CO2 27 04/07/2017 1016   BUN 8 09/01/2017 1318   BUN 13.8 04/07/2017 1016   CREATININE 0.85 09/01/2017 1318   CREATININE 1.1 04/07/2017 1016      Component Value Date/Time   CALCIUM 9.4 09/01/2017 1318   CALCIUM 9.5 04/07/2017 1016   ALKPHOS 88 09/01/2017 1318   ALKPHOS 94 04/07/2017 1016   AST 15 09/01/2017 1318   AST 13 04/07/2017 1016   ALT 8 09/01/2017 1318   ALT 8 04/07/2017 1016   BILITOT 0.6 09/01/2017 1318   BILITOT 0.36 04/07/2017 1016       RADIOGRAPHIC STUDIES:  Dg Chest 2 View  Result Date: 08/11/2017 CLINICAL DATA:  72 year old female with cough. EXAM: CHEST - 2 VIEW COMPARISON:  Chest radiograph dated 08/03/2017 FINDINGS: There is a confluent area of density extending from the right hilum to the lateral pleural surface which appears more prominent and more confluent compared to the prior radiograph and may represent interval development of superimposed infection on an area of post treatment changes. Clinical correlation is recommended. A small right pleural effusion is seen. The left lung is clear. There is no pneumothorax. The cardiac silhouette is within normal limits. Postsurgical changes of the left hilum and multiple surgical clips noted. There is osteopenia with degenerative changes of the spine and upper thoracic vertebroplasty. Sclerotic changes of the left humeral head as before. No acute osseous pathology. IMPRESSION: Slight interval prominence of the density in the right mid lung field may represent superimposed infection on post treatment changes. Clinical correlation is recommended. Trace right pleural effusion. Electronically Signed   By: Anner Crete M.D.   On: 08/11/2017 22:10   Dg Chest 2 View  Result Date: 08/03/2017 CLINICAL DATA:  Increased dyspnea for 1-1/2 weeks. Patient is receiving infusions for lung cancer and complains of productive cough. EXAM: CHEST - 2 VIEW COMPARISON:  06/22/2017 CT and 06/09/2017 CXR  FINDINGS: Heart and mediastinal contours are stable with surgical clips and chain sutures about the mediastinum and left hilum as before. Bandlike opacity in inferior right upper lobe appears more confluent than prior studies and may reflect progression of airspace disease and potentially pneumonia. Chronic right-sided rib fractures and upper thoracic kyphoplasty. IMPRESSION: More confluent bandlike opacity in the inferior right upper lobe may reflect superimposed pneumonia on post treatment change. Stable postop appearance of the left hilum and mediastinum. Electronically Signed   By: Ashley Royalty M.D.   On: 08/03/2017 19:10   Ct Chest Wo Contrast  Result Date: 08/11/2017 CLINICAL DATA:  72 year old female with shortness of breath. History of non-small-cell right upper lobe lung cancer status post prior radiation treatment in the right upper lobe mass as well as chemotherapy. EXAM: CT CHEST WITHOUT CONTRAST TECHNIQUE: Multidetector CT imaging of the chest was performed following the standard protocol without IV contrast. COMPARISON:  Chest radiograph dated 08/11/2017 and CT dated 06/22/2017 FINDINGS: Evaluation of this exam is limited in the absence of intravenous contrast. Cardiovascular: There is no cardiomegaly or pericardial effusion. Coronary vascular calcification noted. There is moderate atherosclerotic calcification of the thoracic aorta. No aneurysmal dilatation. The central pulmonary arteries are grossly unremarkable as visualized. Mediastinum/Nodes: Evaluation of the hila is limited in the absence of  intravenous contrast. No mediastinal adenopathy. Liquid content noted within the esophagus. The esophagus is otherwise grossly unremarkable. Lungs/Pleura: There is a small right pleural effusion which is similar or slightly increased compared to the prior CT. Stable or minimally increased rounded masslike density in the right suprahilar region now measuring 3.7 x 3.4 cm (previously 3.8 x 3.0 cm) measured  on the coronal series. This lesion measures 3.1 x 2.5 cm (previously 2.7 x 2.7 cm) on the axial series. Patchy area of density extending from the right hilum to the pleural surface most likely post radiation treatment and scarring. Multiple small nodular density in the right lung anterior to the postradiation changes again noted without significant interval change since the prior CT. Nodular density anterior to the right major fissure measures up to 9 mm (series 5, image 80), stable. A 5 mm ground-glass nodular density in the right lower lobe (series 5, image 98) appears similar. There is a background of emphysema. No pneumothorax. Upper Abdomen: Gallstones. Bilateral low attenuating adrenal lesions appears similar with negative attenuation consistent with adenoma. Musculoskeletal: Osteopenia with degenerative changes of the spine. T4 and T5 old compression fracture as well as T4 vertebroplasty changes. No acute osseous pathology. IMPRESSION: 1. Post treatment changes of the right lung with area of scarring extending from the right hilum to the right pleural surface. Right suprahilar soft tissue density appears similar or slightly increased compared to prior CT. This may represent postsurgical changes although recurrent or residual disease is not entirely excluded. PET-CT may provide better evaluation. 2. Stable or slightly increased in the size of the right pleural effusion. 3. Multiple small nodular densities in the right lung, similar to prior CT. 4. Aortic Atherosclerosis (ICD10-I70.0) and Emphysema (ICD10-J43.9). Electronically Signed   By: Anner Crete M.D.   On: 08/11/2017 23:50     ASSESSMENT/PLAN:  Primary cancer of right upper lobe of lung [facility]) This is a very pleasant 72 year old white female with recurrent non-small cell lung cancer, adenocarcinoma that was initially diagnosed as unresectable stage IIA in December 2012 status post systemic chemotherapy as well as immunotherapy.  She is  currently undergoing treatment with immunotherapy with Nivolumab 240 mg IV every 2 weeks.  She is status post 8 cycles of the new regimen. She has tolerated her treatment fairly well.  She has missed several doses due to increasing shortness of breath. She was seen in the emergency room and had a CT scan of the chest and is here to discuss the results and her treatment options.  The patient was seen with Dr. [name].  CT scan results were discussed with the patient and her son.  We discussed she has no evidence of disease progression.  We again reviewed treatment options with the patient including continuing with immunotherapy versus observation.  The patient is concerned that she is too short of breath and not feeling well enough to continue on treatment at this time.  She is opted for observation.  Plan is for a repeat CT scan of the chest in approximately 2 months.  She will follow-up 1 to 2 days after the CT scan to discuss the results.  For her COPD, recommend she follow-up with pulmonology.  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  She was advised to call immediately if she has any concerning symptoms in the interval. The patient voices understanding of current disease status and treatment  options and is in agreement with the current care plan. All questions were answered. The patient knows to call the clinic with any problems, questions or concerns. We can certainly see the patient much sooner if necessary.   Orders Placed This Encounter  Procedures  . CT CHEST W CONTRAST    Standing Status:   Future    Standing Expiration Date:   09/02/2018    Order Specific Question:   If indicated for the ordered procedure, I authorize the administration of contrast media per Radiology protocol    Answer:   Yes    Order Specific Question:   Preferred imaging location?    Answer:   [facility]    Order  Specific Question:   Radiology Contrast Protocol - do NOT remove file path    Answer:   [email or web address]    Order Specific Question:   Reason for Exam additional comments    Answer:   Lung cancer. Restaging.  Marland Kitchen CBC with Differential (Cancer Center Only)    Standing Status:   Future    Standing Expiration Date:   09/02/2018  . CMP ([place] [facility] only)    Standing Status:   Future    Standing Expiration Date:   09/02/2018   Mikey Bussing, DNP, AGPCNP-BC, AOCNP 09/01/17   ADDENDUM: Hematology/Oncology Attending: I had a face-to-face encounter with the patient.  I recommended her care plan.  This is a very pleasant 72 years old white female with recurrent non-small cell lung cancer, adenocarcinoma and has been on treatment with nivolumab recently.  Her last dose was 6 weeks ago.  The patient has persistent shortness of breath secondary to COPD.  She is currently followed by [name] for her condition.  She has been on treatment with prednisone recently. The patient had repeat CT scan of the chest performed recently that showed no concerning findings for disease progression. I had a lengthy discussion with the patient and her son about her current condition and treatment options. I think his main problem is COPD at this point.  Her lung cancer may be mildly contributing to her shortness of breath but it has been stable for several months even without treatment. I gave the patient the option of resuming her treatment with Nivolumab versus continuous observation and monitoring.  She is not interested in starting nivolumab right now.  I will arrange for her to come back for follow-up visit in 2 months for evaluation with repeat CT scan of the chest for restaging of her disease. She will continue her routine follow-up visit and management of her COPD by [name]. She was advised to call if she has any concerning symptoms in the interval.  Disclaimer: This note was  dictated with voice recognition software. Similar sounding words can inadvertently be transcribed and may be missed upon review. Eilleen Kempf, MD 09/02/17

## 2017-09-01 NOTE — Assessment & Plan Note (Signed)
This is a very pleasant 72 year old white female with recurrent non-small cell lung cancer, adenocarcinoma that was initially diagnosed as unresectable stage IIA in December 2012 status post systemic chemotherapy as well as immunotherapy.  She is currently undergoing treatment with immunotherapy with Nivolumab 240 mg IV every 2 weeks.  She is status post 8 cycles of the new regimen. She has tolerated her treatment fairly well.  She has missed several doses due to increasing shortness of breath. She was seen in the emergency room and had a CT scan of the chest and is here to discuss the results and her treatment options.  The patient was seen with Dr. [name].  CT scan results were discussed with the patient and her son.  We discussed she has no evidence of disease progression.  We again reviewed treatment options with the patient including continuing with immunotherapy versus observation.  The patient is concerned that she is too short of breath and not feeling well enough to continue on treatment at this time.  She is opted for observation.  Plan is for a repeat CT scan of the chest in approximately 2 months.  She will follow-up 1 to 2 days after the CT scan to discuss the results.  For her COPD, recommend she follow-up with pulmonology.  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  She was advised to call immediately if she has any concerning symptoms in the interval. The patient voices understanding of current disease status and treatment options and is in agreement with the current care plan. All questions were answered. The patient knows to call the clinic with any problems, questions or concerns. We can certainly see the patient much sooner if necessary.

## 2017-09-01 NOTE — Telephone Encounter (Signed)
Pt has been informed, expressed understanding but was very argumentative over PCP's answer stating that she would go find another doctor, I offered her the referral and an option to schedule an OV to come in to discuss with PCP. She stated that she will call back tomorrow to make an appt.

## 2017-09-01 NOTE — Telephone Encounter (Signed)
Appointments scheduled AVS/Calendar printed per 5/23 los

## 2017-09-02 ENCOUNTER — Telehealth: Payer: Self-pay | Admitting: Oncology

## 2017-09-02 NOTE — Telephone Encounter (Signed)
Letter written per son's request. Mailed to patient's home address.

## 2017-09-07 ENCOUNTER — Telehealth: Payer: Self-pay

## 2017-09-07 NOTE — Telephone Encounter (Signed)
Spoke with pt's son regarding their request for a letter from Mikey Bussing, NP. Informed pt's son that the letter was mailed on 5/24. Informed pt's son that a letter will also be at the front desk for them to pick up if they prefer. Pt's son verbalizes understanding.

## 2017-09-08 ENCOUNTER — Encounter: Payer: Self-pay | Admitting: Emergency Medicine

## 2017-09-08 ENCOUNTER — Ambulatory Visit (INDEPENDENT_AMBULATORY_CARE_PROVIDER_SITE_OTHER): Payer: Medicare Other | Admitting: Emergency Medicine

## 2017-09-08 DIAGNOSIS — J320 Chronic maxillary sinusitis: Secondary | ICD-10-CM

## 2017-09-08 DIAGNOSIS — C3411 Malignant neoplasm of upper lobe, right bronchus or lung: Secondary | ICD-10-CM

## 2017-09-08 DIAGNOSIS — B370 Candidal stomatitis: Secondary | ICD-10-CM

## 2017-09-08 DIAGNOSIS — J441 Chronic obstructive pulmonary disease with (acute) exacerbation: Secondary | ICD-10-CM | POA: Diagnosis not present

## 2017-09-08 MED ORDER — FLUCONAZOLE 100 MG PO TABS: 100.0000 mg | ORAL_TABLET | Freq: Every day | ORAL | 0 refills | Status: DC

## 2017-09-08 NOTE — Assessment & Plan Note (Signed)
She complains of oral thrush, also at the corners of her mouth.  She relates she has vaginal yeast infection as well.  I believe she needs fluconazole we will order this now.  She can continue use the nystatin cream on the corners of her mouth.

## 2017-09-08 NOTE — Assessment & Plan Note (Signed)
Lots of clear nasal drainage.  I suspect that this is chronic rhinitis, has been refractory to nasal steroid and antihistamine.  She has not tried Atrovent or Astelin nasal sprays.  She could not tolerate nasal saline.  She is very concerned about her prior sinus surgery and chronic sinus disease as a possible contributor.  She wants to go back to see Dr. Lucia Gaskins with ENT.  I will refer her there and also check a CT scan of her sinuses so this can be reviewed when she goes for that appointment.

## 2017-09-08 NOTE — Addendum Note (Signed)
Addended by: Desmond Dike C on: 09/08/2017 02:56 PM   Modules accepted: Orders

## 2017-09-08 NOTE — Patient Instructions (Signed)
Please take fluconazole 100 mg daily for the next 5 days. We will perform a CT scan of your sinuses We will refer you back to see Dr. Lucia Gaskins with ENT to assess your sinuses, congestion and review your CT scan. Please continue your Trelegy and albuterol as you have been using them. Please continue your oxygen as you have been using it. Please follow with [name] as planned. Follow with Dr Lamonte Sakai in 3 months or sooner if you have any problems.

## 2017-09-08 NOTE — Assessment & Plan Note (Signed)
Continue her current inhaler regimen which includes Trelegy, albuterol as needed.

## 2017-09-08 NOTE — Assessment & Plan Note (Signed)
Currently off of immunotherapy.  It was held because it was felt that she was having recurrent bouts of her COPD, was unstable to tolerate.  Hopefully we will be able to achieve some degree of stability and she can restart.  Her most recent CT scan does not show any progression of disease.

## 2017-09-08 NOTE — Progress Notes (Signed)
Subjective:    Patient ID: [name], female    DOB: 1945-12-30, 72 y.o.   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  She has asked me to manage her Norco and I have been filling this for her.  I gave her a prednisone taper on 06/14/17 for increased congestion, cough.  Based on her continued pulmonary symptoms her immunotherapy has been held.  A CT scan of her chest was done on 06/23/17 to look for interval change and I have reviewed.  This shows slight improvement in her right sided aeration with some residual right upper lobe nodular density that is unchanged.  Slight decrease in her right pleural effusion her left lower lobe groundglass opacity is unchanged. She has a lot of nasal clear drainage - is on allegra rotated w loratadine, flonase.   ROV 09/08/17 --72 year old woman with severe COPD and associated chronic hypoxemic respiratory failure.  She is followed by 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 Her immunotherapy is on hold right now pending some improvement in her COPD, sinus disease.  Her chronic issues include chronic cough, pain and we have refilled her Norco.  She has some chronic right sided airspace disease and a chronic right pleural effusion.  Also he has a lot of clear nasal and sinus drainage.  Her most recent CT scan of the chest was 08/11/2017 and I have reviewed.  This shows little change in her right suprahilar soft tissue density with associated right sided scarring.  Her pleural effusion is for the most part stable, possibly slightly larger. She is having a lot of nasosinus drainage, clear, causing her to get choked. She is on flonase, is unable to tolerate [place]. She wants to go back to ENT. She is also having thrush, involving  corners of her mouth.      Objective:   Physical Exam Vitals:   09/08/17 1425  BP: (!) 152/88  Pulse: (!) 114  SpO2: 96%  Weight: 174 lb (78.9 kg)   Gen: Pleasant, elderly woman, in no distress,  normal affect  ENT: No lesions,  Some white coating of her tongue, red dried skin around her mouth  Neck: No JVD, no stridor  Lungs: No use of accessory muscles, distant, no wheeze  Cardiovascular: regular, 3/6 systolic M with an intact S2.    Musculoskeletal: No deformities, no cyanosis or clubbing.   Neuro: alert, non focal  Skin: Warm, no lesions or rashes   CBC Latest Ref Rng & Units 09/01/2017 08/12/2017 07/21/2017  WBC 3.9 - 10.3 K/uL 8.3 12.5(H) 11.0(H)  Hemoglobin 11.6 - 15.9 g/dL 11.0(L) 11.6(L) 12.2  Hematocrit 34.8 - 46.6 % 33.6(L) 35.8(L) 36.3  Platelets 145 - 400 K/uL 286 276 278   BMET    Component Value Date/Time   NA 139 09/01/2017 1318   NA 141 04/07/2017 1016   K 3.1 (L) 09/01/2017 1318   K 3.4 (L) 04/07/2017 1016   CL 99 09/01/2017 1318   CL 99 07/21/2012 1125   CO2 30 (H) 09/01/2017 1318   CO2 27 04/07/2017 1016   GLUCOSE 126 09/01/2017 1318   GLUCOSE 93 04/07/2017 1016   GLUCOSE 184 (H) 07/21/2012 1125   BUN 8 09/01/2017 1318   BUN 13.8 04/07/2017 1016  CREATININE 0.85 09/01/2017 1318   CREATININE 1.1 04/07/2017 1016   CALCIUM 9.4 09/01/2017 1318   CALCIUM 9.5 04/07/2017 1016   GFRNONAA >60 09/01/2017 1318   GFRAA >60 09/01/2017 1318   CT scan of the chest 06/22/17  COMPARISON:  03/24/2017  FINDINGS: Cardiovascular: Aortic and branch vessel atherosclerosis. Normal heart size, without pericardial effusion. Multivessel coronary artery atherosclerosis.  Mediastinum/Nodes: No supraclavicular adenopathy. No well-defined mediastinal adenopathy. Hilar regions poorly evaluated without intravenous contrast.  Lungs/Pleura: Small right-sided pleural effusion is decreased. 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 Right upper  lobe endobronchial compression/narrowing is similar. Left upper lobectomy.  Central right upper lobe nodular density measures 1.4 x 1.3 cm on image 62/7 versus similar on the prior exam (when remeasured).  The 8 mm ground-glass nodule and adjacent 2 mm soft tissue nodule in the right lower lobe are felt to be similar, including on 94/7. Minimal nodularity along the right major fissure is felt to be new on image 78/7.  Anteromedial right lower lobe band like consolidation could be treatment related and is similar. Minimal improvement in right upper lobe aeration with ground-glass and airspace disease remaining.  Re-demonstration of collapse/consolidation in the posteromedial right upper lobe.  Posterior left lower lobe atelectasis or scar. more anterior left lower lobe ground-glass opacity is similar, including on image 60/7.  Upper Abdomen: Normal imaged portions of the liver, spleen, stomach, pancreas, gallbladder, left kidney. Bilateral low-density adrenal nodularity is not significantly changed, favoring adenomas.  Musculoskeletal: Mild osteopenia. Posttraumatic or postsurgical defects about the right chest wall/ribs. Status post T4 vertebral augmentation with similar mild to moderate T5 superior endplate compression deformity.  IMPRESSION: 1. Slight improvement in right-sided aeration. Residual right upper lobe nodular density, not significantly changed. Areas of right upper lobe ground-glass and consolidation persist. 2. Aortic atherosclerosis (ICD10-I70.0), coronary artery atherosclerosis and emphysema (ICD10-J43.9). 3. Slight decrease in right pleural effusion. 4. Similar left lower lobe ground-glass opacity.      Assessment & Plan:  Sinusitis, chronic Lots of clear nasal drainage.  I suspect that this is chronic rhinitis, has been refractory to nasal steroid and antihistamine.  She has not tried Atrovent or Astelin nasal sprays.  She could not tolerate  nasal saline.  She is very concerned about her prior sinus surgery and chronic sinus disease as a possible contributor.  She wants to go back to see Dr. Lucia Gaskins with ENT.  I will refer her there and also check a CT scan of her sinuses so this can be reviewed when she goes for that appointment.  Thrush She complains of oral thrush, also at the corners of her mouth.  She relates she has vaginal yeast infection as well.  I believe she needs fluconazole we will order this now.  She can continue use the nystatin cream on the corners of her mouth.  COPD (chronic obstructive pulmonary disease) (HCC) Continue her current inhaler regimen which includes Trelegy, albuterol as needed.  Primary cancer of right upper lobe of lung ([place]) Currently off of immunotherapy.  It was held because it was felt that she was having recurrent bouts of her COPD, was unstable to tolerate.  Hopefully we will be able to achieve some degree of stability and she can restart.  Her most recent CT scan does not show any progression of disease.  Baltazar Apo, MD, PhD 09/08/2017, 2:50 PM [facility] [phone] or if no answer [phone]

## 2017-09-09 ENCOUNTER — Telehealth: Payer: Self-pay | Admitting: Emergency Medicine

## 2017-09-09 MED ORDER — FLUCONAZOLE 100 MG PO TABS: 100.0000 mg | ORAL_TABLET | Freq: Every day | ORAL | 0 refills | Status: DC

## 2017-09-09 NOTE — Telephone Encounter (Signed)
Spoke with patient. She was seen by RB yesterday and lost her RX for her Diflucan. Patient is in the process of moving and can not find RX anywhere. Advised patient I would go ahead and send in another RX for her. She verbalized understanding.   Nothing further needed at time of call.

## 2017-09-13 ENCOUNTER — Telehealth: Payer: Self-pay | Admitting: Internal Medicine

## 2017-09-13 DIAGNOSIS — I509 Heart failure, unspecified: Secondary | ICD-10-CM

## 2017-09-13 NOTE — Telephone Encounter (Signed)
Copied from [place] [id number]. Topic: General - Other >> Sep 13, 2017  4:55 PM Marin Olp L wrote: Reason for CRM: Patient would like an order for a high rise portable commode to be sent to [facility]? Please advise.

## 2017-09-14 NOTE — Telephone Encounter (Signed)
Done erx 

## 2017-09-14 NOTE — Telephone Encounter (Signed)
Faxed

## 2017-09-16 ENCOUNTER — Other Ambulatory Visit: Payer: Medicare Other

## 2017-09-19 ENCOUNTER — Other Ambulatory Visit: Payer: Medicare Other | Admitting: Internal Medicine

## 2017-09-19 DIAGNOSIS — Z515 Encounter for palliative care: Secondary | ICD-10-CM

## 2017-09-21 ENCOUNTER — Ambulatory Visit (INDEPENDENT_AMBULATORY_CARE_PROVIDER_SITE_OTHER)
Admission: RE | Admit: 2017-09-21 | Discharge: 2017-09-21 | Disposition: A | Discharge status: Home / Self Care | Payer: Medicare Other | Source: Ambulatory Visit | Attending: Emergency Medicine | Admitting: Emergency Medicine

## 2017-09-21 DIAGNOSIS — J320 Chronic maxillary sinusitis: Secondary | ICD-10-CM

## 2017-09-21 NOTE — Progress Notes (Signed)
    PALLIATIVE CARE CONSULT VISIT   PATIENT NAME: [name] DOB: Mar 24, 1946 MRN: [id number]  PRIMARY CARE PROVIDER:   [name], MD  REFERRING PROVIDER:      [name], MD [place], [place] [address]  RESPONSIBLE PARTY:  Self     NOTE:    Arrived at patient residence on [address] for routine follow-up visit.  No response at the door.  Phoned and spoke to patient.  She stated that she has moved into her daughter's home permanently and is not interested in Palliative Care at this time.  I informed her that we would follow-up with her via phone in the near future.  She agreed with plan.     Gonzella Lex, NP

## 2017-09-23 ENCOUNTER — Telehealth: Payer: Self-pay

## 2017-09-23 DIAGNOSIS — I509 Heart failure, unspecified: Secondary | ICD-10-CM

## 2017-09-23 NOTE — Telephone Encounter (Signed)
Done erx 

## 2017-09-23 NOTE — Telephone Encounter (Signed)
sihrron - can you place this and pend in the orders? thanks

## 2017-09-23 NOTE — Telephone Encounter (Signed)
Copied from [place] [id number]. Topic: Inquiry >> Sep 23, 2017 12:36 PM Pricilla Handler wrote: Reason for CRM: Patient called wanting to speak with Dr. Jenny Reichmann regarding the portable commode she received. Patient stated that she did not want the potty seat she received, but the Elevated Commode Seat. The Elevated Commode Seat can be added on top of a standard commode in many restrooms. Patient would like for Dr. Jenny Reichmann to order a Elevated Commode Seat for her home asap. Please call this patient today at [phone].         Thank You!!!

## 2017-09-23 NOTE — Telephone Encounter (Signed)
Done.  I had to add the type she requested in the "note" section of the order.

## 2017-09-23 NOTE — Addendum Note (Signed)
Addended by: Juliet Rude on: 09/23/2017 03:58 PM   Modules accepted: Orders

## 2017-09-23 NOTE — Addendum Note (Signed)
Addended by: [name] on: 09/23/2017 04:50 PM   Modules accepted: Orders

## 2017-09-26 ENCOUNTER — Other Ambulatory Visit: Payer: Self-pay | Admitting: Emergency Medicine

## 2017-09-26 NOTE — Telephone Encounter (Signed)
Faxed to [facility]  

## 2017-09-27 DIAGNOSIS — J310 Chronic rhinitis: Secondary | ICD-10-CM | POA: Diagnosis not present

## 2017-09-30 ENCOUNTER — Telehealth: Payer: Self-pay | Admitting: Emergency Medicine

## 2017-09-30 MED ORDER — FLUCONAZOLE 100 MG PO TABS: 100.0000 mg | ORAL_TABLET | Freq: Every day | ORAL | 0 refills | Status: DC

## 2017-09-30 MED ORDER — DOXYCYCLINE HYCLATE 100 MG PO TABS: 100.0000 mg | ORAL_TABLET | Freq: Two times a day (BID) | ORAL | 0 refills | Status: DC

## 2017-09-30 NOTE — Telephone Encounter (Signed)
Per RB- Prescriptions for Doxycycline 100mg , BID for 7 days and Fluconazole 100mg  QD for 3 days sent to preferred pharmacy, [facility].  Called and spoke with Patient.  Instructions for prescriptions given to Patient and repeated back.  Nothing further at this time.

## 2017-09-30 NOTE — Telephone Encounter (Signed)
Called and spoke to pt and relayed below recommendations.  Pt is requesting a stronger abx, as she feels she is immuned to [place].  Pt also stated that when taking abx, she developed a yeast infection. Pt is requesting Rx for yeast infection as well.   RB please advise. Thanks.

## 2017-09-30 NOTE — Telephone Encounter (Signed)
Ok to order doxycycline 100mg  bid x 7 days instead.  Ok to order fluconazole 100mg  qd x 3 days > do not start it unless she develops signs of a yeast infxn

## 2017-09-30 NOTE — Telephone Encounter (Signed)
Called and spoke with patient, she stated that she has not been feeling good since Tuesday of this week. Patient complains of congestion, sneezing, low grade fever, body aches, and coughing up green mucus.  RB please advise, thank you.

## 2017-09-30 NOTE — Telephone Encounter (Signed)
Please ask her to try doing nasal saline washes once daily if she can tolerate. Also ask her to temporarily start tylenol Cold & Flu as directed OTC  Call in azithromycin Z pack

## 2017-10-04 ENCOUNTER — Ambulatory Visit: Payer: Medicare Other | Admitting: Emergency Medicine

## 2017-10-05 ENCOUNTER — Telehealth: Payer: Self-pay | Admitting: Emergency Medicine

## 2017-10-05 NOTE — Telephone Encounter (Signed)
Form has been received. It is waiting to be signed by [name]. Will update chart once it's signed.

## 2017-10-07 ENCOUNTER — Other Ambulatory Visit: Payer: Self-pay | Admitting: *Deleted

## 2017-10-07 MED ORDER — NYSTATIN 100000 UNIT/GM EX CREA: 1.0000 "application " | TOPICAL_CREAM | Freq: Two times a day (BID) | CUTANEOUS | 0 refills | Status: DC

## 2017-10-07 NOTE — Telephone Encounter (Signed)
Cassandra, [facility], calling to see if can get RX for ipratropium/albuterol sent back today for the patient.  Fax [phone].  Phone [phone].

## 2017-10-07 NOTE — Telephone Encounter (Signed)
Called [facility] and spoke with Redgie Grayer is requesting update on the Rx form that was faxed for patient's generic Duoneb Per 6.26.19 documentation, form is awaiting RB's signature  Routing back to [place] for form signature Thank you

## 2017-10-07 NOTE — Telephone Encounter (Signed)
Pt called [facility] requesting  refill on Nystatin cream for lips as she has run out and " it is helping quite a bit" Rx reordered.

## 2017-10-11 ENCOUNTER — Telehealth: Payer: Self-pay | Admitting: Emergency Medicine

## 2017-10-11 NOTE — Telephone Encounter (Signed)
Called and spoke to [place] with [facility], who requested current med list and Rx for Duoneb be faxed. I have located signed Rx for Duoneb. Rx and med list has been faxed to provided fax number.  Nothing further is needed.

## 2017-10-14 ENCOUNTER — Telehealth: Payer: Self-pay | Admitting: Emergency Medicine

## 2017-10-14 MED ORDER — CEFDINIR 300 MG PO CAPS: 300.0000 mg | ORAL_CAPSULE | Freq: Two times a day (BID) | ORAL | 0 refills | Status: DC

## 2017-10-14 NOTE — Telephone Encounter (Signed)
Called and spoke with pt letting her know the recommendations per MW.  Pt stated the places are just in her nose due to her picking at them after her nose has become irritated some from the flonase nasal spray.  Verified pt's pharmacy and sent script of omnicef to pt's pharmacy. Stated to her she can pick up neosporin in the first aid section at the pharmacy.  Nothing further needed.

## 2017-10-14 NOTE — Telephone Encounter (Signed)
If they are all inside the nose can use neosporin ointment and omnicef 300 mg bid x 7 day- if any of them are on the skin it could be a form of shingles and would need to UC or PCP to be sure this is not the case and rx appropriately if so

## 2017-10-14 NOTE — Telephone Encounter (Signed)
Spoke with patient. She stated that she has developed sores in her nose over the past week. She had a head cold and noticed them afterwards. They are making her entire nose hurt. Denies seeing any blood. She has been taking Claritin during the day and Flonase at night. She wants to know what else can she use to help heal the sore.   She wishes to use [facility] on [address].   MW, please advise since RB is not in the office today. Thanks!

## 2017-10-14 NOTE — Telephone Encounter (Signed)
ATC pt- unable to leave vm, due to mailbox not being setup.  Will call back

## 2017-10-18 ENCOUNTER — Telehealth: Payer: Self-pay | Admitting: Emergency Medicine

## 2017-10-18 MED ORDER — PREDNISONE 10 MG PO TABS: ORAL_TABLET | ORAL | 0 refills | Status: DC

## 2017-10-18 NOTE — Telephone Encounter (Signed)
Called spoke with patient Advised of RB's recommendations as stated below Pt voiced her understanding Rx sent to verified pharmacy Pt is aware to contact the office if symptoms do not improve or they worsen  Nothing further needed; will sign off

## 2017-10-18 NOTE — Telephone Encounter (Signed)
Please have her take prednisone 30mg  qd x 5 days.

## 2017-10-18 NOTE — Telephone Encounter (Signed)
Pt c/o increased sob, chest congestion X1 day.  Cough is mostly nonprod with occasional green/white mucus.  Also notes increased fatigue.   Denies chest pain, fever.  Pt has been taking otc cough syrup, mucinex, and claritin to help with s/s, as well as maintenance meds.  Pt is also on Omnicef prescribed on 7/5 by our office.  Requesting additional recs.  Pt uses [facility] on [address].    RB please advise on recs.  Thanks.

## 2017-10-20 ENCOUNTER — Telehealth: Payer: Self-pay | Admitting: Emergency Medicine

## 2017-10-20 NOTE — Telephone Encounter (Signed)
Spoke with pt. She states that she is still having issues with congestion. Pt wanted to know if she could take Mucinex. Advised her that it would be okay. Nothing further was needed at this time.

## 2017-10-24 ENCOUNTER — Telehealth: Payer: Self-pay | Admitting: Emergency Medicine

## 2017-10-24 MED ORDER — LEVOFLOXACIN 500 MG PO TABS: 500.0000 mg | ORAL_TABLET | Freq: Every day | ORAL | 0 refills | Status: DC

## 2017-10-24 NOTE — Telephone Encounter (Signed)
Called and spoke with pt who states she has congestion in her head, ears, sores in her nose, rattling in her chest, and also states she feels like she is running a fever today, 7/15.  Advised pt that due to to the frequent calls we have had with her calling in not feeling well, stated to pt we needed to schedule an OV for her to come by office.  Pt expressed understanding and stated she wanted me to check with RB to see if he would prescribe an abx as well as steroids due to pt not sure if she will be able to get a ride. Pt stated to me she would try to see if her daughter could bring her by the office one day this week for an OV but said she wanted [place] to go ahead and check with RB as well.  [name], please advise on recs for pt. Thanks!

## 2017-10-24 NOTE — Telephone Encounter (Signed)
She just completed brief course prednisone. I agree that she needs to be seen. Not clear to me that all of the problems at hand are related, particularly the nasal sores.   Have her start levaquin 500mg  daily x 7 days now as a precaution, come in for an OV to be checked.

## 2017-10-24 NOTE — Telephone Encounter (Signed)
Spoke with the pt and notified of recs per RB  She verbalized understanding  Rx sent and appt scheduled

## 2017-10-25 ENCOUNTER — Ambulatory Visit (INDEPENDENT_AMBULATORY_CARE_PROVIDER_SITE_OTHER)
Admission: RE | Admit: 2017-10-25 | Discharge: 2017-10-25 | Disposition: A | Discharge status: Home / Self Care | Payer: Medicare Other | Source: Ambulatory Visit | Attending: Primary Care | Admitting: Primary Care

## 2017-10-25 ENCOUNTER — Encounter: Payer: Self-pay | Admitting: Primary Care

## 2017-10-25 ENCOUNTER — Ambulatory Visit (INDEPENDENT_AMBULATORY_CARE_PROVIDER_SITE_OTHER): Payer: Medicare Other | Admitting: Primary Care

## 2017-10-25 VITALS — BP 148/90 | HR 114 | Temp 99.9°F | Ht 66.0 in | Wt 167.2 lb

## 2017-10-25 DIAGNOSIS — R05 Cough: Secondary | ICD-10-CM | POA: Diagnosis not present

## 2017-10-25 DIAGNOSIS — J320 Chronic maxillary sinusitis: Secondary | ICD-10-CM | POA: Diagnosis not present

## 2017-10-25 DIAGNOSIS — J440 Chronic obstructive pulmonary disease with acute lower respiratory infection: Secondary | ICD-10-CM

## 2017-10-25 DIAGNOSIS — B370 Candidal stomatitis: Secondary | ICD-10-CM

## 2017-10-25 DIAGNOSIS — C3411 Malignant neoplasm of upper lobe, right bronchus or lung: Secondary | ICD-10-CM | POA: Diagnosis not present

## 2017-10-25 DIAGNOSIS — J3489 Other specified disorders of nose and nasal sinuses: Secondary | ICD-10-CM

## 2017-10-25 DIAGNOSIS — J209 Acute bronchitis, unspecified: Secondary | ICD-10-CM

## 2017-10-25 DIAGNOSIS — J441 Chronic obstructive pulmonary disease with (acute) exacerbation: Secondary | ICD-10-CM | POA: Diagnosis not present

## 2017-10-25 DIAGNOSIS — R000 Tachycardia, unspecified: Secondary | ICD-10-CM | POA: Diagnosis not present

## 2017-10-25 MED ORDER — PREDNISONE 10 MG PO TABS: ORAL_TABLET | ORAL | 0 refills | Status: DC

## 2017-10-25 MED ORDER — NYSTATIN 100000 UNIT/ML MT SUSP: 5.0000 mL | Freq: Four times a day (QID) | OROMUCOSAL | 0 refills | Status: AC

## 2017-10-25 NOTE — Patient Instructions (Addendum)
-  Continue mucinex twice daily for head congestion (with full glass of water) -NasoGel ([facility]) to help sooth/moisturize nasal membrane  -Stop flonase, continue with normal saline spray -Nystatin swish/swallow 4 times a day for thrush to mouth (1-2 weeks)  -Tylenol for fever/chills -CXR today  -Follow up in 2 weeks (with [name],NP) -Continue Levaquin as prescribed by [name]  -Add prednisone taper (sent to pharmacy)

## 2017-10-25 NOTE — Assessment & Plan Note (Signed)
-   Following with oncology, next apt on July 25th - CXR showing increased density of RUL opacity

## 2017-10-25 NOTE — Progress Notes (Signed)
@Patient  ID: [name], female    DOB: 1945-06-12, 72 y.o.   MRN: [id number]  Chief Complaint  Patient presents with  . Acute Visit    coughing ,wheezing, nasal congestion , chest congestion     Referring provider: [name], MD  HPI: 72 year old female. 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 Patient of [name], last seen on 09/08/17. Next apt with oncology is on July 25th with [name].  Patient has called a number of times this past month d/t not feeling well, complaints include nasal congestion, sneezing, cough with green mucus. Sent RX for azithromycin, patient requested abx be changed to something stronger. Started on doxycycline end of June. Called on 7/5 with acute complaints of head cold and nose sores. Continued taking Flonase and Claritin. RX for neosporin ointment and omnicef sent. Third call on 7/9 for increased sob, chest congestion and cough with some colored sputum. Given RX for prednisone x5days. Called again on 7/11 for issues with head and chest congestion, taking mucinex. Also states she has sores in her nose and low grade temp. Given RX levaquin, started on 7/15. Instructed to follow up in office on 7/16.    10/25/2017 Presents today for office eval for continued complaints of head/chest congestion, nose sores and low grade temp. She took first dose of levaquin yesterday. Cough is productive with green/yellow sputum. Associated sinus and ear congestin. Taking mucinex and flonase. Continues Trelegy and albuterol prn.     Allergies  Allergen Reactions  . Iohexol Shortness Of Breath and Other (See Comments)    "burns me up inside"    Pt does fine with 13 hour prep  01/17/12  . Other     CAN'T STAND THE SMELL OF PURE BLEACH; "HAVE TO BACK UP AND POUR AND DON'T BREATH IT TIL i GET CAP BACK ON; DILUTE IT TO SPRAY"  . Anoro Ellipta [Umeclidinium-Vilanterol] Swelling    Swelling of lips and  mouth  . Codeine Itching and Nausea And Vomiting    REACTION: GI upset  . Protonix [Pantoprazole Sodium] Diarrhea    Immunization History  Administered Date(s) Administered  . Influenza Split 02/08/2007, 03/19/2011, 01/27/2012  . Influenza Whole 01/02/2010  . Influenza, High Dose Seasonal PF 02/03/2017  . Influenza,inj,Quad PF,6+ Mos 12/27/2012, 02/12/2014, 01/22/2016  . Pneumococcal Conjugate-13 02/14/2017  . Pneumococcal Polysaccharide-23 03/27/2013  . Tdap 02/14/2017    Past Medical History:  Diagnosis Date  . Adenocarcinoma, lung ([place])    upper left lobe  . Anemia in neoplastic disease   . Anxiety   . Arthritis    "all over my body"  . Asthma   . Asymptomatic varicose veins   . Atherosclerosis of native arteries of the extremities, unspecified   . Atrial fibrillation ([place])   . Bronchial pneumonia    history  . 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 Chemotherapy adverse reaction 09/15/11   "makes me light headed and pass out; this is 3rd blood transfusion since going thru it"  . CHF (congestive heart failure) ([place])   . Chronic airway obstruction, not elsewhere classified   . Chronic fatigue 08/19/2015  . Chronic sinusitis   . Complication of anesthesia    slow to wake up  . COPD with emphysema ([place])   . Coronary artery disease    Dr. Burt Knack had stress test done  . DDD (degenerative disc disease), lumbar   .  Depressive disorder, not elsewhere classified   . Diseases of lips   . Disorder of bone and cartilage, unspecified   . DVT (deep venous thrombosis) ([place])   . Fibromyalgia   . Functional urinary incontinence   . GERD (gastroesophageal reflux disease)   . Hard of hearing, right   . Herpes zoster without mention of complication   . Hip pain, chronic 09/17/2014  . History of blood clots    "2 in my aorta"  . History of blood transfusion   . History of bronchitis   . History of echocardiogram    Echo 11/18: mild LVH, EF 60-65, no RWMA, Gr 1 DD, Ao Sclerosis,  mild AI, RVH with low nl RSVF, trivial eff  . Hx of radiation therapy 04/28/11 -06/08/11   right upper lobe-lung  . Hyperlipidemia   . Hypertension   . Kidney infection    "related to chemo"  . Lumbago   . Malignant neoplasm of bronchus and lung, unspecified site   . Mental disorder   . Muscle weakness (generalized)   . Non-small cell lung cancer ([place]) 03/31/2011   Adenocarcinoma Rt upper lobe mass  . Osteoarthrosis, unspecified whether generalized or localized, unspecified site   . Osteoporosis   . Other abnormal blood chemistry   . Other and unspecified hyperlipidemia   . PAD (peripheral artery disease) ([place])   . Pain in thoracic spine   . 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 Primary cancer of right upper lobe of lung ([place]) 02/08/2011   Biopsy Rt upper lobe mass 03/31/11>>adenocarcinoma DIAGNOSIS: Stage IIB/IIIA non-small cell lung cancer consistent with adenocarcinoma diagnosed in December 2012.   PRIOR THERAPY:  1) Status post radiation therapy to the right upper lobe lung mass completed on 06/04/2011 under the care of Dr. Pablo Ledger.  2) Systemic chemotherapy with carboplatin for AUC of 5 and Alimta 500 mg/M2 giving every 3   . Reflux   . Reflux esophagitis   . Respiratory failure with hypoxia 01/02/2010  . Restless legs syndrome (RLS)   . Scoliosis   . Screening for thyroid disorder   . Shortness of breath    "anytime really"  . Sinus headache    "all the time"  . Spasm of muscle   . Syncope and collapse   . Type II or unspecified type diabetes mellitus with neurological manifestations, uncontrolled(250.62)   . Type II or unspecified type diabetes mellitus without mention of complication, not stated as uncontrolled   . Type II or unspecified type diabetes mellitus without mention of complication, not stated as uncontrolled   . Unspecified constipation   . Unspecified essential hypertension   . Unspecified hearing loss   . Unspecified hereditary and idiopathic peripheral  neuropathy   . Unspecified vitamin D deficiency     Tobacco History: Social History   Tobacco Use  Smoking Status Former Smoker  . Packs/day: 1.00  . Years: 40.00  . Pack years: 40.00  . Types: Cigarettes  . Last attempt to quit: 02/10/1998  . Years since quitting: 19.7  Smokeless Tobacco Never Used   Counseling given: Not Answered   Outpatient Medications Prior to Visit  Medication Sig Dispense Refill  . ACCU-CHEK SOFTCLIX LANCETS lancets by Other route. Check blood sugar twice daily. Dx: E11.9, E11.49    . albuterol (PROVENTIL HFA;VENTOLIN HFA) 108 (90 Base) MCG/ACT inhaler Inhale 2 puffs into the lungs every 6 (six) hours as needed for wheezing or shortness of breath. 1 Inhaler 6  . albuterol (PROVENTIL) (2.5  MG/3ML) 0.083% nebulizer solution USE 1 VIAL VIA NEBULIZER EVERY 6 HOURS AS NEEDED FOR WHEEZING OR SHORTNESS OF BREATH 75 mL 3  . ALPRAZolam (XANAX) 1 MG tablet TAKE 1 TABLET BY MOUTH 2 TIMES A DAY AS NEEDED FOR ANXIETY 60 tablet 2  . aspirin 81 MG tablet Take 81 mg by mouth daily.    Marland Kitchen atorvastatin (LIPITOR) 20 MG tablet Take 1 tablet (20 mg total) by mouth daily. 90 tablet 3  . Blood Glucose Monitoring Suppl (ACCU-CHEK AVIVA PLUS) w/Device KIT Use as directed to check blood sugar Dx: E11.9, E11.49 1 kit 0  . Calcium Carbonate-Vit D-Min (CALTRATE PLUS PO) Take 1 tablet by mouth daily.    . fluticasone (FLONASE) 50 MCG/ACT nasal spray SHAKE LIQUID WELL AND USE 2 SPRAYS IN EACH NOSTRIL TWICE DAILY (Patient taking differently: SHAKE LIQUID WELL AND USE 2 SPRAYS IN EACH NOSTRIL DAILY) 16 g 5  . folic acid (FOLVITE) 1 MG tablet Take 1 tablet (1 mg total) by mouth daily. 30 tablet 4  . guaiFENesin (MUCINEX) 600 MG 12 hr tablet Take 600 mg by mouth 2 (two) times daily as needed for cough.    Marland Kitchen HYDROcodone-acetaminophen (NORCO) 10-325 MG tablet Take 1 tablet by mouth 2 (two) times daily as needed. 60 tablet 0  . ipratropium-albuterol (DUONEB) 0.5-2.5 (3) MG/3ML SOLN Take 3 mLs by  nebulization.    Marland Kitchen levofloxacin (LEVAQUIN) 500 MG tablet Take 1 tablet (500 mg total) by mouth daily. 7 tablet 0  . loratadine (CLARITIN) 10 MG tablet TAKE 1 TABLET BY MOUTH EVERY DAY 30 tablet 5  . metFORMIN (GLUCOPHAGE) 500 MG tablet TAKE 1 TABLET BY MOUTH EVERY MORNING AS NEEDED FOR BLOOD SUGAR OVER 160 AND 1 TABLET BY MOUTH EVERY EVENING 60 tablet 1  . nystatin cream (MYCOSTATIN) Apply 1 application topically 2 (two) times daily. Apply to corners of mouth twice a day as needed. 30 g 0  . omeprazole (PRILOSEC) 20 MG capsule Take 1 capsule (20 mg total) by mouth daily. 90 capsule 3  . OXYGEN Inhale 4 L into the lungs.    . potassium chloride (K-DUR,KLOR-CON) 10 MEQ tablet Take 1 tablet (10 mEq total) daily by mouth. 90 tablet 3  . potassium chloride (K-DUR,KLOR-CON) 10 MEQ tablet TAKE 1 TABLET BY MOUTH ONCE DAILY 30 tablet 5  . senna (SENOKOT) 8.6 MG tablet Take 1 tablet by mouth 2 (two) times daily as needed for constipation.     . traMADol (ULTRAM) 50 MG tablet TAKE 2 TABLETS (100MG TOTAL) BY MOUTH EVERY 6 HOURS AS NEEDED FOR SEVERE PAIN 90 tablet 2  . TRELEGY ELLIPTA 100-62.5-25 MCG/INH AEPB INHALE 1 PUFF BY MOUTH ONCE DAILY 60 each 5  . triamterene-hydrochlorothiazide (MAXZIDE-25) 37.5-25 MG tablet Take 1 tablet by mouth daily.     Marland Kitchen UNABLE TO FIND Med Name: Cranial Prothesis for Chemotherapy 1 each 0  . VENTOLIN HFA 108 (90 Base) MCG/ACT inhaler INHALE 2 PUFFS BY MOUTH EVERY 6 HOURS AS NEEDED FOR WHEEZING OR SHORTNESS OF BREATH 18 each 4  . nystatin (MYCOSTATIN) 100000 UNIT/ML suspension Swish and swallow 87m four times daily x5-7days 140 mL 0  . predniSONE (DELTASONE) 10 MG tablet Take 3 tablets (340m in the morning for 5 days 15 tablet 0  . furosemide (LASIX) 40 MG tablet Take 1 tablet (40 mg total) by mouth daily for 3 doses. 3 tablet 0  . cefdinir (OMNICEF) 300 MG capsule Take 1 capsule (300 mg total) by mouth 2 (two) times daily.  14 capsule 0  . doxycycline (VIBRA-TABS) 100 MG tablet  Take 1 tablet (100 mg total) by mouth 2 (two) times daily. 14 tablet 0  . fluconazole (DIFLUCAN) 100 MG tablet Take 1 tablet (100 mg total) by mouth daily. 5 tablet 0  . fluconazole (DIFLUCAN) 100 MG tablet Take 1 tablet (100 mg total) by mouth daily. 3 tablet 0   Facility-Administered Medications Prior to Visit  Medication Dose Route Frequency Provider Last Rate Last Dose  . hyaluronate sodium (RADIAPLEXRX) gel   Topical Once Thea Silversmith, MD      . topical emolient (BIAFINE) emulsion   Topical PRN Thea Silversmith, MD          Review of Systems  Review of Systems  HENT: Positive for congestion, postnasal drip and rhinorrhea.        Nose sore  Respiratory: Positive for cough.   Neurological: Negative.    Physical Exam  BP (!) 148/90   Pulse (!) 114   Temp 99.9 F (37.7 C) (Oral)   Ht 5' 6"  (1.676 m)   Wt 167 lb 3.2 oz (75.8 kg)   SpO2 98%   BMI 26.99 kg/m  Physical Exam  Constitutional: She is oriented to person, place, and time.  HENT:  Head: Normocephalic and atraumatic.  Right Ear: Tympanic membrane normal.  Left Ear: Tympanic membrane normal.  Nose: Mucosal edema and rhinorrhea present.  Mouth/Throat: Uvula is midline. Posterior oropharyngeal erythema present.  Fissure to tongue, white film to right lateral posterior tongue. Nasal mucous membranes swollen, red (no external or internal lesions seen on exam)  Neck: Normal range of motion. Neck supple.  Cardiovascular: Regular rhythm. Tachycardia present.  Pulmonary/Chest: Accessory muscle usage present. No tachypnea. No respiratory distress. She has wheezes.  Abdominal: Soft. Normal appearance.  Lymphadenopathy:       Head (right side): No submandibular and no tonsillar adenopathy present.       Head (left side): No submandibular and no tonsillar adenopathy present.    She has no cervical adenopathy.  Neurological: She is alert and oriented to person, place, and time.  Skin: Skin is warm, dry and intact.    Psychiatric: She has a normal mood and affect. Her speech is normal and behavior is normal. Thought content normal.     Lab Results:  CBC    Component Value Date/Time   WBC 8.3 09/01/2017 1318   WBC 12.5 (H) 08/12/2017 0147   RBC 3.71 09/01/2017 1318   HGB 11.0 (L) 09/01/2017 1318   HGB 12.7 04/07/2017 1015   HCT 33.6 (L) 09/01/2017 1318   HCT 39.0 04/07/2017 1015   PLT 286 09/01/2017 1318   PLT 203 04/07/2017 1015   MCV 90.6 09/01/2017 1318   MCV 90.1 04/07/2017 1015   MCH 29.6 09/01/2017 1318   MCHC 32.7 09/01/2017 1318   RDW 13.1 09/01/2017 1318   RDW 13.5 04/07/2017 1015   LYMPHSABS 1.1 09/01/2017 1318   LYMPHSABS 2.1 04/07/2017 1015   MONOABS 0.7 09/01/2017 1318   MONOABS 0.7 04/07/2017 1015   EOSABS 0.4 09/01/2017 1318   EOSABS 1.1 (H) 04/07/2017 1015   EOSABS 0.4 08/06/2015 1036   BASOSABS 0.0 09/01/2017 1318   BASOSABS 0.0 04/07/2017 1015    BMET    Component Value Date/Time   NA 139 09/01/2017 1318   NA 141 04/07/2017 1016   K 3.1 (L) 09/01/2017 1318   K 3.4 (L) 04/07/2017 1016   CL 99 09/01/2017 1318   CL 99 07/21/2012 1125  CO2 30 (H) 09/01/2017 1318   CO2 27 04/07/2017 1016   GLUCOSE 126 09/01/2017 1318   GLUCOSE 93 04/07/2017 1016   GLUCOSE 184 (H) 07/21/2012 1125   BUN 8 09/01/2017 1318   BUN 13.8 04/07/2017 1016   CREATININE 0.85 09/01/2017 1318   CREATININE 1.1 04/07/2017 1016   CALCIUM 9.4 09/01/2017 1318   CALCIUM 9.5 04/07/2017 1016   GFRNONAA >60 09/01/2017 1318   GFRAA >60 09/01/2017 1318    BNP    Component Value Date/Time   BNP 33.4 08/11/2017 2131    ProBNP    Component Value Date/Time   PROBNP 160 11/30/2016 1627    Imaging: Dg Chest 2 View  Result Date: 10/25/2017 CLINICAL DATA:  Cough for 2 months. Low-grade fever. History of lung cancer. EXAM: CHEST - 2 VIEW COMPARISON:  PA and lateral chest 06/09/2017 and 08/11/2017. CT chest 08/11/2017, 03/24/2017 and 11/10/2016. FINDINGS: The lungs are emphysematous. Masslike  right upper lobe opacity is again seen and more dense than on the 06/09/2017 examination appears more prominent on the lateral view compared to the most recent plain films. The left lung is clear. Surgical clips projecting in the left hilum are unchanged. No pneumothorax or pleural effusion. The patient is status post upper thoracic vertebral augmentation. No acute or focal bony abnormality. IMPRESSION: Right upper lobe opacity demonstrates increased density since the prior plain films, particularly the 06/09/2017. This could represent infection superimposed on post treatment change but underlying carcinoma is possible. Recommend repeat PA and lateral films in 4-6 weeks after appropriate therapy. If the patient's chest film does not return to baseline of the 06/09/2017, chest CT with contrast or PET CT scan is recommended for further evaluation. Electronically Signed   By: Inge Rise M.D.   On: 10/25/2017 15:25     Assessment & Plan:   Sinusitis, chronic - Follows with ENT - CT maxillofacial in June showing clear paranasal sinuses  Thrush - Sending nystatin swish/swallow  Nasal sore - Nasal mucous membranes swollen, red (no external or internal lesions seen on exam) - Advised stop flonase which may be irritating lining - Try NasoGel and saline spray    Primary cancer of right upper lobe of lung ([facility]) - Following with oncology, next apt on July 25th - CXR showing increased density of RUL opacity    COPD with acute exacerbation ([facility]) - No  improvement with doxy or prednisone - Started on levaquin per [name], sending additional prednisone course  - CXR showing increase density of RUL opacity. This could represent infection superimposed on post treatment change but underlying carcinoma is possible. Recommend repeat PA and lateral films in 4-6 weeks  - FU in 2 week in office  Tachycardia - Regular rhythm, HR 114-120 (hx of tachycardia during a previous OV) - Likely d/t low grade  temp, encourage tylenol and fluids      Martyn Ehrich, NP 10/25/2017

## 2017-10-25 NOTE — Assessment & Plan Note (Signed)
-   Sending nystatin swish/swallow

## 2017-10-25 NOTE — Assessment & Plan Note (Signed)
-   Nasal mucous membranes swollen, red (no external or internal lesions seen on exam) - Advised stop flonase which may be irritating lining - Try NasoGel and saline spray

## 2017-10-25 NOTE — Assessment & Plan Note (Signed)
-   No  improvement with doxy or prednisone - Started on levaquin per [name], sending additional prednisone course  - CXR showing increase density of RUL opacity. This could represent infection superimposed on post treatment change but underlying carcinoma is possible. Recommend repeat PA and lateral films in 4-6 weeks  - FU in 2 week in office

## 2017-10-25 NOTE — Assessment & Plan Note (Signed)
-   Regular rhythm, HR 114-120 (hx of tachycardia during a previous OV) - Likely d/t low grade temp, encourage tylenol and fluids

## 2017-10-25 NOTE — Assessment & Plan Note (Signed)
-   Follows with ENT - CT maxillofacial in June showing clear paranasal sinuses

## 2017-11-01 ENCOUNTER — Inpatient Hospital Stay: Payer: Medicare Other | Attending: Medical

## 2017-11-01 ENCOUNTER — Telehealth: Payer: Self-pay | Admitting: Internal Medicine

## 2017-11-01 ENCOUNTER — Ambulatory Visit (HOSPITAL_COMMUNITY)
Admission: RE | Admit: 2017-11-01 | Discharge: 2017-11-01 | Disposition: A | Discharge status: Home / Self Care | Payer: Medicare Other | Source: Ambulatory Visit | Attending: Oncology | Admitting: Oncology

## 2017-11-01 ENCOUNTER — Telehealth: Payer: Self-pay | Admitting: Emergency Medicine

## 2017-11-01 DIAGNOSIS — J449 Chronic obstructive pulmonary disease, unspecified: Secondary | ICD-10-CM | POA: Insufficient documentation

## 2017-11-01 DIAGNOSIS — R2243 Localized swelling, mass and lump, lower limb, bilateral: Secondary | ICD-10-CM | POA: Diagnosis not present

## 2017-11-01 DIAGNOSIS — C3411 Malignant neoplasm of upper lobe, right bronchus or lung: Secondary | ICD-10-CM

## 2017-11-01 LAB — CMP (CANCER CENTER ONLY)
ALK PHOS: 111 U/L (ref 38–126)
ALT: 9 U/L (ref 0–44)
AST: 11 U/L — AB (ref 15–41)
Albumin: 3.9 g/dL (ref 3.5–5.0)
Anion gap: 13 (ref 5–15)
BUN: 25 mg/dL — AB (ref 8–23)
CALCIUM: 9.7 mg/dL (ref 8.9–10.3)
CO2: 29 mmol/L (ref 22–32)
CREATININE: 1.01 mg/dL — AB (ref 0.44–1.00)
Chloride: 99 mmol/L (ref 98–111)
GFR, EST NON AFRICAN AMERICAN: 54 mL/min — AB (ref >60)
GFR, Est AFR Am: >60 mL/min (ref >60)
Glucose, Bld: 138 mg/dL — ABNORMAL HIGH (ref 70–99)
Potassium: 4.1 mmol/L (ref 3.5–5.1)
SODIUM: 141 mmol/L (ref 135–145)
Total Bilirubin: 0.4 mg/dL (ref 0.3–1.2)
Total Protein: 6.6 g/dL (ref 6.5–8.1)

## 2017-11-01 LAB — CBC WITH DIFFERENTIAL (CANCER CENTER ONLY)
BASOS ABS: 0.1 10*3/uL (ref 0.0–0.1)
Basophils Relative: 0 %
EOS ABS: 0 10*3/uL (ref 0.0–0.5)
EOS PCT: 0 %
HEMATOCRIT: 35.8 % (ref 34.8–46.6)
HEMOGLOBIN: 11.8 g/dL (ref 11.6–15.9)
LYMPHS ABS: 1.1 10*3/uL (ref 0.9–3.3)
Lymphocytes Relative: 8 %
MCH: 28.3 pg (ref 25.1–34.0)
MCHC: 32.8 g/dL (ref 31.5–36.0)
MCV: 86.2 fL (ref 79.5–101.0)
MONO ABS: 0.4 10*3/uL (ref 0.1–0.9)
MONOS PCT: 3 %
Neutro Abs: 12.7 10*3/uL — ABNORMAL HIGH (ref 1.5–6.5)
Neutrophils Relative %: 89 %
Platelet Count: 266 10*3/uL (ref 145–400)
RBC: 4.15 MIL/uL (ref 3.70–5.45)
RDW: 14.1 % (ref 11.2–14.5)
WBC Count: 14.3 10*3/uL — ABNORMAL HIGH (ref 3.9–10.3)

## 2017-11-01 MED ORDER — HYDROCODONE-ACETAMINOPHEN 10-325 MG PO TABS: 1.0000 | ORAL_TABLET | Freq: Two times a day (BID) | ORAL | 0 refills | Status: DC | PRN

## 2017-11-01 NOTE — Telephone Encounter (Signed)
Rx for Norco 10mg  has been printed and placed up front for pickup. lmtcb x1 for pt, as pt's son Octavia Bruckner is not on DPR.

## 2017-11-01 NOTE — Telephone Encounter (Signed)
09/11/2017 60#  

## 2017-11-01 NOTE — Telephone Encounter (Signed)
Informed Home Care Delivery form was received however PCP has been out of the office and form will be faxed back as soon as it has been completed. Rep expressed understanding and will notate in patient file.

## 2017-11-01 NOTE — Telephone Encounter (Signed)
Copied from [place] [phone]. Topic: Quick Communication - Rx Refill/Question >> Nov 01, 2017  9:58 AM Judyann Munson wrote: Medication: ALPRAZolam Duanne Moron) 1 MG tablet     Has the patient contacted their pharmacy? No   Preferred Pharmacy (with phone number or street name):   Agent: Please be advised that RX refills may take up to 3 business days. We ask that you follow-up with your pharmacy.

## 2017-11-01 NOTE — Telephone Encounter (Signed)
Pt is requesting a refill on Vicodin 10/325.   Last refill:  08/19/17 #60 with 0 refills. Take 1 tab BID prn. Pt would like son to pick up this rx this afternoon if possible.  RB please advise if ok to refill.  Thanks!

## 2017-11-01 NOTE — Telephone Encounter (Signed)
OK with me to fill this.

## 2017-11-01 NOTE — Telephone Encounter (Signed)
Copied from [place] [id number]. Topic: Quick Communication - See Telephone Encounter >> Nov 01, 2017 12:57 PM Conception Chancy, NT wrote: CRM for notification. See Telephone encounter for: 11/01/17.  Estill Bamberg is calling from [place] and would like to confirm the office received the fax for incontinence supplies.  Cb# [phone]

## 2017-11-02 ENCOUNTER — Other Ambulatory Visit: Payer: Self-pay | Admitting: Medical Oncology

## 2017-11-02 ENCOUNTER — Telehealth: Payer: Self-pay | Admitting: Medical Oncology

## 2017-11-02 DIAGNOSIS — Z91041 Radiographic dye allergy status: Secondary | ICD-10-CM

## 2017-11-02 MED ORDER — PREDNISONE 50 MG PO TABS: ORAL_TABLET | ORAL | 2 refills | Status: DC

## 2017-11-02 NOTE — Telephone Encounter (Signed)
Forms have been faxed 

## 2017-11-02 NOTE — Telephone Encounter (Signed)
Called and spoke with pt's son [name] regarding Rx is ready for p/u today in front office Advised Tim that he is currently not on DPR He is coming in office tomorrow to p/u rx and sign DPR Nothing further is needed

## 2017-11-02 NOTE — Telephone Encounter (Signed)
"  knot behind R knee and left knee."- Noticed them yesterday. Hurts when she walks.

## 2017-11-03 ENCOUNTER — Encounter: Payer: Self-pay | Admitting: Internal Medicine

## 2017-11-03 ENCOUNTER — Ambulatory Visit (HOSPITAL_COMMUNITY)
Admission: RE | Admit: 2017-11-03 | Discharge: 2017-11-03 | Disposition: A | Discharge status: Home / Self Care | Payer: Medicare Other | Source: Ambulatory Visit | Attending: Oncology | Admitting: Oncology

## 2017-11-03 ENCOUNTER — Inpatient Hospital Stay (HOSPITAL_BASED_OUTPATIENT_CLINIC_OR_DEPARTMENT_OTHER): Payer: Medicare Other | Admitting: Internal Medicine

## 2017-11-03 ENCOUNTER — Other Ambulatory Visit (HOSPITAL_COMMUNITY): Payer: Medicare Other

## 2017-11-03 ENCOUNTER — Ambulatory Visit (HOSPITAL_COMMUNITY): Payer: Medicare Other

## 2017-11-03 ENCOUNTER — Telehealth: Payer: Self-pay | Admitting: Internal Medicine

## 2017-11-03 ENCOUNTER — Ambulatory Visit (HOSPITAL_COMMUNITY): Admission: RE | Admit: 2017-11-03 | Payer: Medicare Other | Source: Ambulatory Visit

## 2017-11-03 VITALS — BP 152/56 | HR 85 | Temp 98.2°F | Resp 18 | Ht 66.0 in | Wt 169.2 lb

## 2017-11-03 DIAGNOSIS — J90 Pleural effusion, not elsewhere classified: Secondary | ICD-10-CM | POA: Diagnosis not present

## 2017-11-03 DIAGNOSIS — R918 Other nonspecific abnormal finding of lung field: Secondary | ICD-10-CM | POA: Diagnosis not present

## 2017-11-03 DIAGNOSIS — R2243 Localized swelling, mass and lump, lower limb, bilateral: Secondary | ICD-10-CM

## 2017-11-03 DIAGNOSIS — J439 Emphysema, unspecified: Secondary | ICD-10-CM | POA: Diagnosis not present

## 2017-11-03 DIAGNOSIS — I10 Essential (primary) hypertension: Secondary | ICD-10-CM

## 2017-11-03 DIAGNOSIS — K8020 Calculus of gallbladder without cholecystitis without obstruction: Secondary | ICD-10-CM | POA: Insufficient documentation

## 2017-11-03 DIAGNOSIS — J449 Chronic obstructive pulmonary disease, unspecified: Secondary | ICD-10-CM | POA: Diagnosis not present

## 2017-11-03 DIAGNOSIS — C3491 Malignant neoplasm of unspecified part of right bronchus or lung: Secondary | ICD-10-CM | POA: Diagnosis not present

## 2017-11-03 DIAGNOSIS — I700 Atherosclerosis of aorta: Secondary | ICD-10-CM | POA: Diagnosis not present

## 2017-11-03 DIAGNOSIS — J984 Other disorders of lung: Secondary | ICD-10-CM | POA: Diagnosis not present

## 2017-11-03 DIAGNOSIS — C3490 Malignant neoplasm of unspecified part of unspecified bronchus or lung: Secondary | ICD-10-CM

## 2017-11-03 DIAGNOSIS — I2510 Atherosclerotic heart disease of native coronary artery without angina pectoris: Secondary | ICD-10-CM | POA: Diagnosis not present

## 2017-11-03 DIAGNOSIS — C3411 Malignant neoplasm of upper lobe, right bronchus or lung: Secondary | ICD-10-CM | POA: Diagnosis not present

## 2017-11-03 DIAGNOSIS — I739 Peripheral vascular disease, unspecified: Secondary | ICD-10-CM

## 2017-11-03 DIAGNOSIS — J441 Chronic obstructive pulmonary disease with (acute) exacerbation: Secondary | ICD-10-CM

## 2017-11-03 MED ORDER — IOHEXOL 300 MG/ML  SOLN
75.0000 mL | Freq: Once | INTRAMUSCULAR | Status: AC | PRN
  Administered 2017-11-03: 75 mL via INTRAVENOUS

## 2017-11-03 NOTE — Progress Notes (Signed)
[place] [phone]   [phone]  OFFICE PROGRESS NOTE  [name], MD [place] [address]  DIAGNOSIS: Recurrent non-small cell lung cancer initially diagnosed as a stage IIA adenocarcinoma in December 2012 with no actionable mutations.   PRIOR THERAPY: 1) Status post radiation therapy to the right upper lobe lung mass completed on 06/04/2011 under the care of Dr. Pablo Ledger.  2) Systemic chemotherapy with carboplatin for AUC of 5 and Alimta 500 mg/M2 giving every 3 weeks, status post 3 cycles, last dose was given on 07/21/2011.  3) Systemic chemotherapy with carboplatin for AUC of 5 and Alimta 500 MG/M2 every 3 weeks. First dose on 01/02/2015. Status post 2 cycles, last dose was given 01/30/2015. Her treatment is currently on hold secondary to intolerance. 4) Immunotherapy with Nivolumab 240 M IV every 2 weeks status post 14 cycles.  5)immunotherapy with Nivolumab 480 mg IV every 4 weeks. Patient is status post 1 cycle. The patient discontinued treatment with Nivolumab every 4 weeks because she want to switch back to every 2 weeks due to increased side effects with the monthly dosing.  CURRENT THERAPY: The patient will resume her treatment again with immunotherapy with Nivolumab 240 mg IV every 2 weeks. First dose 09/15/2016. Status post 8 cycles  INTERVAL HISTORY: [name] 72 y.o. female returns to the clinic today for 3 months follow-up visit accompanied by her son.  The patient continues to complain of the baseline shortness of breath and she is on home oxygen.  She is currently undergoing treatment for COPD under the care of [name].  She is complaining of feeling knots on the back of her legs bilaterally.  She is concerned about inflammation or clots in these legs.  She denied having any swelling.  She denied having any fever or chills.  She has no nausea, vomiting, diarrhea or constipation.  She has no recent  weight loss or night sweats.  She had repeat CT scan of the chest performed earlier today and she is here for evaluation and discussion of her risk her results and treatment options.  MEDICAL HISTORY: Past Medical History:  Diagnosis Date  . Adenocarcinoma, lung ([place])    upper left lobe  . Anemia in neoplastic disease   . Anxiety   . Arthritis    "all over my body"  . Asthma   . Asymptomatic varicose veins   . Atherosclerosis of native arteries of the extremities, unspecified   . Atrial fibrillation ([place])   . Bronchial pneumonia    history  . 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 Chemotherapy adverse reaction 09/15/11   "makes me light headed and pass out; this is 3rd blood transfusion since going thru it"  . CHF (congestive heart failure) ([place])   . Chronic airway obstruction, not elsewhere classified   . Chronic fatigue 08/19/2015  . Chronic sinusitis   . Complication of anesthesia    slow to wake up  . COPD with emphysema ([place])   . Coronary artery disease    Dr. Burt Knack had stress test done  . DDD (degenerative disc disease), lumbar   . Depressive disorder, not elsewhere classified   . Diseases of lips   . Disorder of bone and cartilage, unspecified   . DVT (deep venous thrombosis) ([place])   . Fibromyalgia   . Functional urinary incontinence   . GERD (gastroesophageal reflux disease)   .  Hard of hearing, right   . Herpes zoster without mention of complication   . Hip pain, chronic 09/17/2014  . History of blood clots    "2 in my aorta"  . History of blood transfusion   . History of bronchitis   . History of echocardiogram    Echo 11/18: mild LVH, EF 60-65, no RWMA, Gr 1 DD, Ao Sclerosis, mild AI, RVH with low nl RSVF, trivial eff  . Hx of radiation therapy 04/28/11 -06/08/11   right upper lobe-lung  . Hyperlipidemia   . Hypertension   . Kidney infection    "related to chemo"  . Lumbago   . Malignant neoplasm of bronchus and lung, unspecified site   . Mental disorder    . Muscle weakness (generalized)   . Non-small cell lung cancer ([place]) 03/31/2011   Adenocarcinoma Rt upper lobe mass  . Osteoarthrosis, unspecified whether generalized or localized, unspecified site   . Osteoporosis   . Other abnormal blood chemistry   . Other and unspecified hyperlipidemia   . PAD (peripheral artery disease) ([place])   . Pain in thoracic spine   . 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 Primary cancer of right upper lobe of lung ([place]) 02/08/2011   Biopsy Rt upper lobe mass 03/31/11>>adenocarcinoma DIAGNOSIS: Stage IIB/IIIA non-small cell lung cancer consistent with adenocarcinoma diagnosed in December 2012.   PRIOR THERAPY:  1) Status post radiation therapy to the right upper lobe lung mass completed on 06/04/2011 under the care of Dr. Pablo Ledger.  2) Systemic chemotherapy with carboplatin for AUC of 5 and Alimta 500 mg/M2 giving every 3   . Reflux   . Reflux esophagitis   . Respiratory failure with hypoxia 01/02/2010  . Restless legs syndrome (RLS)   . Scoliosis   . Screening for thyroid disorder   . Shortness of breath    "anytime really"  . Sinus headache    "all the time"  . Spasm of muscle   . Syncope and collapse   . Type II or unspecified type diabetes mellitus with neurological manifestations, uncontrolled(250.62)   . Type II or unspecified type diabetes mellitus without mention of complication, not stated as uncontrolled   . Type II or unspecified type diabetes mellitus without mention of complication, not stated as uncontrolled   . Unspecified constipation   . Unspecified essential hypertension   . Unspecified hearing loss   . Unspecified hereditary and idiopathic peripheral neuropathy   . Unspecified vitamin D deficiency     ALLERGIES:  is allergic to iohexol; other; anoro ellipta [umeclidinium-vilanterol]; codeine; and protonix [pantoprazole sodium].  MEDICATIONS:  Current Outpatient Medications  Medication Sig Dispense Refill  . ACCU-CHEK SOFTCLIX  LANCETS lancets by Other route. Check blood sugar twice daily. Dx: E11.9, E11.49    . albuterol (PROVENTIL HFA;VENTOLIN HFA) 108 (90 Base) MCG/ACT inhaler Inhale 2 puffs into the lungs every 6 (six) hours as needed for wheezing or shortness of breath. 1 Inhaler 6  . albuterol (PROVENTIL) (2.5 MG/3ML) 0.083% nebulizer solution USE 1 VIAL VIA NEBULIZER EVERY 6 HOURS AS NEEDED FOR WHEEZING OR SHORTNESS OF BREATH 75 mL 3  . ALPRAZolam (XANAX) 1 MG tablet TAKE 1 TABLET BY MOUTH 2 TIMES A DAY AS NEEDED FOR ANXIETY 60 tablet 2  . aspirin 81 MG tablet Take 81 mg by mouth daily.    Marland Kitchen atorvastatin (LIPITOR) 20 MG tablet Take 1 tablet (20 mg total) by mouth daily. 90 tablet 3  . Blood Glucose Monitoring Suppl (ACCU-CHEK AVIVA PLUS)  w/Device KIT Use as directed to check blood sugar Dx: E11.9, E11.49 1 kit 0  . Calcium Carbonate-Vit D-Min (CALTRATE PLUS PO) Take 1 tablet by mouth daily.    . fluticasone (FLONASE) 50 MCG/ACT nasal spray SHAKE LIQUID WELL AND USE 2 SPRAYS IN EACH NOSTRIL TWICE DAILY (Patient taking differently: SHAKE LIQUID WELL AND USE 2 SPRAYS IN EACH NOSTRIL DAILY) 16 g 5  . folic acid (FOLVITE) 1 MG tablet Take 1 tablet (1 mg total) by mouth daily. 30 tablet 4  . furosemide (LASIX) 40 MG tablet Take 1 tablet (40 mg total) by mouth daily for 3 doses. 3 tablet 0  . guaiFENesin (MUCINEX) 600 MG 12 hr tablet Take 600 mg by mouth 2 (two) times daily as needed for cough.    Marland Kitchen HYDROcodone-acetaminophen (NORCO) 10-325 MG tablet Take 1 tablet by mouth 2 (two) times daily as needed. 60 tablet 0  . ipratropium-albuterol (DUONEB) 0.5-2.5 (3) MG/3ML SOLN Take 3 mLs by nebulization.    Marland Kitchen levofloxacin (LEVAQUIN) 500 MG tablet Take 1 tablet (500 mg total) by mouth daily. 7 tablet 0  . loratadine (CLARITIN) 10 MG tablet TAKE 1 TABLET BY MOUTH EVERY DAY 30 tablet 5  . metFORMIN (GLUCOPHAGE) 500 MG tablet TAKE 1 TABLET BY MOUTH EVERY MORNING AS NEEDED FOR BLOOD SUGAR OVER 160 AND 1 TABLET BY MOUTH EVERY EVENING  60 tablet 1  . nystatin (MYCOSTATIN) 100000 UNIT/ML suspension Take 5 mLs (500,000 Units total) by mouth 4 (four) times daily for 10 days. 60 mL 0  . nystatin cream (MYCOSTATIN) Apply 1 application topically 2 (two) times daily. Apply to corners of mouth twice a day as needed. 30 g 0  . omeprazole (PRILOSEC) 20 MG capsule Take 1 capsule (20 mg total) by mouth daily. 90 capsule 3  . OXYGEN Inhale 4 L into the lungs.    . potassium chloride (K-DUR,KLOR-CON) 10 MEQ tablet Take 1 tablet (10 mEq total) daily by mouth. 90 tablet 3  . potassium chloride (K-DUR,KLOR-CON) 10 MEQ tablet TAKE 1 TABLET BY MOUTH ONCE DAILY 30 tablet 5  . predniSONE (DELTASONE) 10 MG tablet Take 4 tabs po daily x 2 days; then 3 tabs for 2 days; then 2 tabs for 2 days; then 1 tab for 2 days 20 tablet 0  . predniSONE (DELTASONE) 50 MG tablet Take one tablet 13 hours , 1 tablet 7 hours and 1 tablet 1 hours prior to CT scan 3 tablet 2  . senna (SENOKOT) 8.6 MG tablet Take 1 tablet by mouth 2 (two) times daily as needed for constipation.     . traMADol (ULTRAM) 50 MG tablet TAKE 2 TABLETS (100MG TOTAL) BY MOUTH EVERY 6 HOURS AS NEEDED FOR SEVERE PAIN 90 tablet 2  . TRELEGY ELLIPTA 100-62.5-25 MCG/INH AEPB INHALE 1 PUFF BY MOUTH ONCE DAILY 60 each 5  . triamterene-hydrochlorothiazide (MAXZIDE-25) 37.5-25 MG tablet Take 1 tablet by mouth daily.     Marland Kitchen UNABLE TO FIND Med Name: Cranial Prothesis for Chemotherapy 1 each 0  . VENTOLIN HFA 108 (90 Base) MCG/ACT inhaler INHALE 2 PUFFS BY MOUTH EVERY 6 HOURS AS NEEDED FOR WHEEZING OR SHORTNESS OF BREATH 18 each 4   No current facility-administered medications for this visit.    Facility-Administered Medications Ordered in Other Visits  Medication Dose Route Frequency Provider Last Rate Last Dose  . hyaluronate sodium (RADIAPLEXRX) gel   Topical Once Thea Silversmith, MD      . topical emolient (BIAFINE) emulsion   Topical PRN  Thea Silversmith, MD        SURGICAL HISTORY:  Past Surgical  History:  Procedure Laterality Date  . BRONCHOSCOPY  02/2011  . CARDIAC CATHETERIZATION    . CATARACT EXTRACTION  03/12/2013   Left Eye   . DILATION AND CURETTAGE OF UTERUS  1980's  . EYE SURGERY Left 2014   Cataract  . FETAL BLOOD TRANSFUSION  09/16/11  . JOINT REPLACEMENT  04/13/10   Left Hip  . LUNG LOBECTOMY  1986   left  . SEPTOPLASTY    . TOTAL HIP ARTHROPLASTY  01/2010   left  . TUBAL LIGATION  1980's    REVIEW OF SYSTEMS:  A comprehensive review of systems was negative except for: Constitutional: positive for fatigue Respiratory: positive for cough, dyspnea on exertion and wheezing   PHYSICAL EXAMINATION: General appearance: alert, cooperative, fatigued and mild distress Head: Normocephalic, without obvious abnormality, atraumatic Neck: no adenopathy, no JVD, supple, symmetrical, trachea midline and thyroid not enlarged, symmetric, no tenderness/mass/nodules Lymph nodes: Cervical, supraclavicular, and axillary nodes normal. Resp: wheezes bilaterally Back: symmetric, no curvature. ROM normal. No CVA tenderness. Cardio: regular rate and rhythm, S1, S2 normal, no murmur, click, rub or gallop GI: soft, non-tender; bowel sounds normal; no masses,  no organomegaly Extremities: extremities normal, atraumatic, no cyanosis or edema  ECOG PERFORMANCE STATUS: 1 - Symptomatic but completely ambulatory  Blood pressure (!) 152/56, pulse 85, temperature 98.2 F (36.8 C), temperature source Oral, resp. rate 18, height 5' 6"  (1.676 m), weight 169 lb 3.2 oz (76.7 kg), SpO2 100 %.  LABORATORY DATA: Lab Results  Component Value Date   WBC 14.3 (H) 11/01/2017   HGB 11.8 11/01/2017   HCT 35.8 11/01/2017   MCV 86.2 11/01/2017   PLT 266 11/01/2017      Chemistry      Component Value Date/Time   NA 141 11/01/2017 1309   NA 141 04/07/2017 1016   K 4.1 11/01/2017 1309   K 3.4 (L) 04/07/2017 1016   CL 99 11/01/2017 1309   CL 99 07/21/2012 1125   CO2 29 11/01/2017 1309   CO2 27  04/07/2017 1016   BUN 25 (H) 11/01/2017 1309   BUN 13.8 04/07/2017 1016   CREATININE 1.01 (H) 11/01/2017 1309   CREATININE 1.1 04/07/2017 1016      Component Value Date/Time   CALCIUM 9.7 11/01/2017 1309   CALCIUM 9.5 04/07/2017 1016   ALKPHOS 111 11/01/2017 1309   ALKPHOS 94 04/07/2017 1016   AST 11 (L) 11/01/2017 1309   AST 13 04/07/2017 1016   ALT 9 11/01/2017 1309   ALT 8 04/07/2017 1016   BILITOT 0.4 11/01/2017 1309   BILITOT 0.36 04/07/2017 1016       RADIOGRAPHIC STUDIES: Dg Chest 2 View  Result Date: 10/25/2017 CLINICAL DATA:  Cough for 2 months. Low-grade fever. History of lung cancer. EXAM: CHEST - 2 VIEW COMPARISON:  PA and lateral chest 06/09/2017 and 08/11/2017. CT chest 08/11/2017, 03/24/2017 and 11/10/2016. FINDINGS: The lungs are emphysematous. Masslike right upper lobe opacity is again seen and more dense than on the 06/09/2017 examination appears more prominent on the lateral view compared to the most recent plain films. The left lung is clear. Surgical clips projecting in the left hilum are unchanged. No pneumothorax or pleural effusion. The patient is status post upper thoracic vertebral augmentation. No acute or focal bony abnormality. IMPRESSION: Right upper lobe opacity demonstrates increased density since the prior plain films, particularly the 06/09/2017. This could represent infection superimposed  on post treatment change but underlying carcinoma is possible. Recommend repeat PA and lateral films in 4-6 weeks after appropriate therapy. If the patient's chest film does not return to baseline of the 06/09/2017, chest CT with contrast or PET CT scan is recommended for further evaluation. Electronically Signed   By: Inge Rise M.D.   On: 10/25/2017 15:25    ASSESSMENT AND PLAN:  This is a very pleasant 72 years old white female with recurrent non-small cell lung cancer, adenocarcinoma that was initially diagnosed as unresectable stage IIA in December 2012  status post systemic chemotherapy as well as immunotherapy.  She is currently undergoing treatment with immunotherapy with Nivolumab 240 mg IV every 2 weeks.  She is status post 8 cycles of the new regimen. The patient has been doing fine with no concerning complaints except for her baseline shortness of breath secondary to COPD.  She had repeat CT scan of the chest performed earlier today.  I personally and independently reviewed the scan images and compared to the previous one.  I do not see any concerning findings for progression but I would wait for the final report for confirmation. I recommended for the patient to continue on observation with repeat CT scan of the chest in 3 months. For the subcutaneous knots in the lower extremities, I will order ultrasound Doppler of the lower extremity to rule out superficial phlebitis or deep venous thrombosis. The patient was advised to call immediately if she has any concerning symptoms in the interval. The patient voices understanding of current disease status and treatment options and is in agreement with the current care plan. All questions were answered. The patient knows to call the clinic with any problems, questions or concerns. We can certainly see the patient much sooner if necessary.  Disclaimer: This note was dictated with voice recognition software. Similar sounding words can inadvertently be transcribed and may not be corrected upon review.

## 2017-11-03 NOTE — Telephone Encounter (Signed)
Scheduled appt per 7/25 los - gave pt AVS and calender per los.

## 2017-11-08 ENCOUNTER — Ambulatory Visit (INDEPENDENT_AMBULATORY_CARE_PROVIDER_SITE_OTHER)
Admission: RE | Admit: 2017-11-08 | Discharge: 2017-11-08 | Disposition: A | Discharge status: Home / Self Care | Payer: Medicare Other | Source: Ambulatory Visit | Attending: Primary Care | Admitting: Primary Care

## 2017-11-08 ENCOUNTER — Other Ambulatory Visit (INDEPENDENT_AMBULATORY_CARE_PROVIDER_SITE_OTHER): Payer: Medicare Other

## 2017-11-08 ENCOUNTER — Ambulatory Visit (INDEPENDENT_AMBULATORY_CARE_PROVIDER_SITE_OTHER): Payer: Medicare Other | Admitting: Primary Care

## 2017-11-08 ENCOUNTER — Encounter: Payer: Self-pay | Admitting: Primary Care

## 2017-11-08 VITALS — BP 120/62 | HR 120 | Ht 66.0 in | Wt 167.0 lb

## 2017-11-08 DIAGNOSIS — R350 Frequency of micturition: Secondary | ICD-10-CM

## 2017-11-08 DIAGNOSIS — R000 Tachycardia, unspecified: Secondary | ICD-10-CM | POA: Diagnosis not present

## 2017-11-08 DIAGNOSIS — R7989 Other specified abnormal findings of blood chemistry: Secondary | ICD-10-CM

## 2017-11-08 DIAGNOSIS — J320 Chronic maxillary sinusitis: Secondary | ICD-10-CM

## 2017-11-08 DIAGNOSIS — J441 Chronic obstructive pulmonary disease with (acute) exacerbation: Secondary | ICD-10-CM | POA: Diagnosis not present

## 2017-11-08 DIAGNOSIS — R05 Cough: Secondary | ICD-10-CM | POA: Diagnosis not present

## 2017-11-08 LAB — URINALYSIS, ROUTINE W REFLEX MICROSCOPIC
Bilirubin Urine: NEGATIVE
Hgb urine dipstick: NEGATIVE
Ketones, ur: NEGATIVE
Leukocytes, UA: NEGATIVE
Nitrite: NEGATIVE
PH: 7.5 (ref 5.0–8.0)
RBC / HPF: NONE SEEN (ref 0–?)
SPECIFIC GRAVITY, URINE: 1.01 (ref 1.000–1.030)
Total Protein, Urine: 30 — AB
URINE GLUCOSE: NEGATIVE
Urobilinogen, UA: 0.2 (ref 0.0–1.0)

## 2017-11-08 LAB — CBC WITH DIFFERENTIAL/PLATELET
BASOS PCT: 0.8 % (ref 0.0–3.0)
Basophils Absolute: 0.1 10*3/uL (ref 0.0–0.1)
EOS PCT: 2.3 % (ref 0.0–5.0)
Eosinophils Absolute: 0.3 10*3/uL (ref 0.0–0.7)
HEMATOCRIT: 36.7 % (ref 36.0–46.0)
HEMOGLOBIN: 11.9 g/dL — AB (ref 12.0–15.0)
LYMPHS PCT: 12.7 % (ref 12.0–46.0)
Lymphs Abs: 1.7 10*3/uL (ref 0.7–4.0)
MCHC: 32.5 g/dL (ref 30.0–36.0)
MCV: 87.3 fl (ref 78.0–100.0)
MONO ABS: 0.8 10*3/uL (ref 0.1–1.0)
Monocytes Relative: 6.4 % (ref 3.0–12.0)
NEUTROS ABS: 10.4 10*3/uL — AB (ref 1.4–7.7)
NEUTROS PCT: 77.8 % — AB (ref 43.0–77.0)
PLATELETS: 262 10*3/uL (ref 150.0–400.0)
RBC: 4.21 Mil/uL (ref 3.87–5.11)
RDW: 14.1 % (ref 11.5–15.5)
WBC: 13.3 10*3/uL — ABNORMAL HIGH (ref 4.0–10.5)

## 2017-11-08 LAB — BASIC METABOLIC PANEL
BUN: 13 mg/dL (ref 6–23)
CHLORIDE: 92 meq/L — AB (ref 96–112)
CO2: 35 meq/L — AB (ref 19–32)
CREATININE: 0.96 mg/dL (ref 0.40–1.20)
Calcium: 9.5 mg/dL (ref 8.4–10.5)
GFR: 60.44 mL/min (ref >60.00)
Glucose, Bld: 118 mg/dL — ABNORMAL HIGH (ref 70–99)
POTASSIUM: 3.3 meq/L — AB (ref 3.5–5.1)
Sodium: 136 mEq/L (ref 135–145)

## 2017-11-08 MED ORDER — PREDNISONE 10 MG PO TABS: ORAL_TABLET | ORAL | 0 refills | Status: DC

## 2017-11-08 MED ORDER — FLUTTER DEVI: 0 refills | Status: AC

## 2017-11-08 MED ORDER — AMOXICILLIN-POT CLAVULANATE 875-125 MG PO TABS: 1.0000 | ORAL_TABLET | Freq: Two times a day (BID) | ORAL | 0 refills | Status: AC

## 2017-11-08 NOTE — Assessment & Plan Note (Signed)
-   UA neg for leuks or nitrates

## 2017-11-08 NOTE — Patient Instructions (Addendum)
  Orders: Check sputum culture and urine culture  Labs and CXR today EKG sinus tachycardia  Plan: Starting AUGMENTIN x 10 days  Push oral fluids, stay well hydrated   FU in 1 week with [name] if possible, if not Eustaquio Maize NP

## 2017-11-08 NOTE — Assessment & Plan Note (Signed)
EKG showing sinus tachycardia, likely d/t fever today in office of 100.6  Recommend pushing oral fluids

## 2017-11-08 NOTE — Progress Notes (Signed)
@Patient  ID: [name], female    DOB: Aug 20, 1945, 72 y.o.   MRN: [id number]  Chief Complaint  Patient presents with  . Follow-up    Has had a lot of congestion she is coughing up yellow mucus.     Referring provider: [name], MD   HPI: 72 year old female. Hx severe COPD, chronic hypoxemic resp failure, recurrent non-small cell lung cancer stage IIA adenocarcinoma (tx Nivolumb). Patient of [name], last seen on 09/08/17. Patient saw [name] with oncology on July 25th. Patient continues with Nivolumab 230m IV every 2 weeks.  Repeat CT with no concerning findings for progression.  Recommended repeating every 3 months. Additional complaints of knots in bilateral legs, ordered for a Doppler ultrasound study of lower extremities tomorrow.            10/25/2017 Patient has called a number of times this past month d/t not feeling well, complaints include nasal congestion, sneezing, cough with green mucus. Sent RX for azithromycin, patient requested abx be changed to something stronger. Started on doxycycline end of June. Called on 7/5 with acute complaints of head cold and nose sores. Continued taking Flonase and Claritin. RX for neosporin ointment and omnicef sent. Third call on 7/9 for increased sob, chest congestion and cough with some colored sputum. Given RX for prednisone x5days. Called again on 7/11 for issues with head and chest congestion, taking mucinex. Also states she has sores in her nose and low grade teomp. Given RX levaquin, started on 7/15. Instructed to follow up in office. Presents today for eval for continued complaints of head/chest congestion, nose sores and low grade temp. She took first dose of levaquin yesterday. Cough is productive with green/yellow sputum. Associated sinus and ear congestin. Taking mucinex and flonase. Continues Trelegy and albuterol prn.   11/08/2017  Presents today for 2 week follow up with her daughter. Patient is in wheelchair with home  oxygen. Appears comfortable. Feels a little better but complains of continued cough, nasal congestion and weakness. Levaquin and prednisone did help initially. Cough is worse at night. Reports orange colored sputum. Associated sinus problems and nasal drainage. Complains of sore, dried up nose. Low grade temp 99 at home. States that she is sob all the time, wheezing in am/pm. Requiring rescue inhaler 2-3 times a day. Continues Trelegy. Used nebulizer yesterday once. Taking mucinex.    Allergies  Allergen Reactions  . Iohexol Shortness Of Breath and Other (See Comments)    "burns me up inside"    Pt does fine with 13 hour prep  01/17/12  . Other     CAN'T STAND THE SMELL OF PURE BLEACH; "HAVE TO BACK UP AND POUR AND DON'T BREATH IT TIL i GET CAP BACK ON; DILUTE IT TO SPRAY"  . Anoro Ellipta [Umeclidinium-Vilanterol] Swelling    Swelling of lips and mouth  . Codeine Itching and Nausea And Vomiting    REACTION: GI upset  . Protonix [Pantoprazole Sodium] Diarrhea    Immunization History  Administered Date(s) Administered  . Influenza Split 02/08/2007, 03/19/2011, 01/27/2012  . Influenza Whole 01/02/2010  . Influenza, High Dose Seasonal PF 02/03/2017  . Influenza,inj,Quad PF,6+ Mos 12/27/2012, 02/12/2014, 01/22/2016  . Pneumococcal Conjugate-13 02/14/2017  . Pneumococcal Polysaccharide-23 03/27/2013  . Tdap 02/14/2017    Past Medical History:  Diagnosis Date  . [place]    upper [place] lobe  . Anemia in neoplastic disease   . Anxiety   . Arthritis    "  all over my body"  . Asthma   . Asymptomatic varicose veins   . Atherosclerosis of native arteries of the extremities, unspecified   . Atrial fibrillation ([place])   . Bronchial pneumonia    history  . 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 Chemotherapy adverse reaction 09/15/11   "makes me light headed and pass out; this is 3rd blood transfusion since going thru it"  . CHF (congestive heart failure) ([place])   .  Chronic airway obstruction, not elsewhere classified   . Chronic fatigue 08/19/2015  . Chronic sinusitis   . Complication of anesthesia    slow to wake up  . COPD with emphysema ([place])   . Coronary artery disease    Dr. Burt Knack had stress test done  . DDD (degenerative disc disease), lumbar   . Depressive disorder, not elsewhere classified   . Diseases of lips   . Disorder of bone and cartilage, unspecified   . DVT (deep venous thrombosis) ([place])   . Fibromyalgia   . Functional urinary incontinence   . GERD (gastroesophageal reflux disease)   . Hard of hearing, right   . Herpes zoster without mention of complication   . Hip pain, chronic 09/17/2014  . History of blood clots    "2 in my aorta"  . History of blood transfusion   . History of bronchitis   . History of echocardiogram    Echo 11/18: mild LVH, EF 60-65, no RWMA, Gr 1 DD, Ao Sclerosis, mild AI, RVH with low nl RSVF, trivial eff  . Hx of radiation therapy 04/28/11 -06/08/11   right upper lobe-lung  . Hyperlipidemia   . Hypertension   . Kidney infection    "related to chemo"  . Lumbago   . Malignant neoplasm of bronchus and lung, unspecified site   . Mental disorder   . Muscle weakness (generalized)   . Non-small cell lung cancer ([place]) 03/31/2011   Adenocarcinoma Rt upper lobe mass  . Osteoarthrosis, unspecified whether generalized or localized, unspecified site   . Osteoporosis   . Other abnormal blood chemistry   . Other and unspecified hyperlipidemia   . PAD (peripheral artery disease) ([place])   . Pain in thoracic spine   . 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 Primary cancer of right upper lobe of lung ([place]) 02/08/2011   Biopsy Rt upper lobe mass 03/31/11>>adenocarcinoma DIAGNOSIS: Stage IIB/IIIA non-small cell lung cancer consistent with adenocarcinoma diagnosed in December 2012.   PRIOR THERAPY:  1) Status post radiation therapy to the right upper lobe lung mass completed on 06/04/2011 under the care of Dr. Pablo Ledger.   2) Systemic chemotherapy with carboplatin for AUC of 5 and Alimta 500 mg/M2 giving every 3   . Reflux   . Reflux esophagitis   . Respiratory failure with hypoxia 01/02/2010  . Restless legs syndrome (RLS)   . Scoliosis   . Screening for thyroid disorder   . Shortness of breath    "anytime really"  . Sinus headache    "all the time"  . Spasm of muscle   . Syncope and collapse   . Type II or unspecified type diabetes mellitus with neurological manifestations, uncontrolled(250.62)   . Type II or unspecified type diabetes mellitus without mention of complication, not stated as uncontrolled   . Type II or unspecified type diabetes mellitus without mention of complication, not stated as uncontrolled   . Unspecified constipation   . Unspecified essential hypertension   . Unspecified hearing  loss   . Unspecified hereditary and idiopathic peripheral neuropathy   . Unspecified vitamin D deficiency     Tobacco History: Social History   Tobacco Use  Smoking Status Former Smoker  . Packs/day: 1.00  . Years: 40.00  . Pack years: 40.00  . Types: Cigarettes  . Last attempt to quit: 02/10/1998  . Years since quitting: 19.7  Smokeless Tobacco Never Used   Counseling given: Not Answered   Outpatient Medications Prior to Visit  Medication Sig Dispense Refill  . ACCU-CHEK SOFTCLIX LANCETS lancets by Other route. Check blood sugar twice daily. Dx: E11.9, E11.49    . albuterol (PROVENTIL HFA;VENTOLIN HFA) 108 (90 Base) MCG/ACT inhaler Inhale 2 puffs into the lungs every 6 (six) hours as needed for wheezing or shortness of breath. 1 Inhaler 6  . albuterol (PROVENTIL) (2.5 MG/3ML) 0.083% nebulizer solution USE 1 VIAL VIA NEBULIZER EVERY 6 HOURS AS NEEDED FOR WHEEZING OR SHORTNESS OF BREATH 75 mL 3  . ALPRAZolam (XANAX) 1 MG tablet TAKE 1 TABLET BY MOUTH 2 TIMES A DAY AS NEEDED FOR ANXIETY 60 tablet 2  . aspirin 81 MG tablet Take 81 mg by mouth daily.    Marland Kitchen atorvastatin (LIPITOR) 20 MG tablet Take  1 tablet (20 mg total) by mouth daily. 90 tablet 3  . Blood Glucose Monitoring Suppl (ACCU-CHEK AVIVA PLUS) w/Device KIT Use as directed to check blood sugar Dx: E11.9, E11.49 1 kit 0  . Calcium Carbonate-Vit D-Min (CALTRATE PLUS PO) Take 1 tablet by mouth daily.    . fluticasone (FLONASE) 50 MCG/ACT nasal spray SHAKE LIQUID WELL AND USE 2 SPRAYS IN EACH NOSTRIL TWICE DAILY (Patient taking differently: SHAKE LIQUID WELL AND USE 2 SPRAYS IN EACH NOSTRIL DAILY) 16 g 5  . folic acid (FOLVITE) 1 MG tablet Take 1 tablet (1 mg total) by mouth daily. 30 tablet 4  . guaiFENesin (MUCINEX) 600 MG 12 hr tablet Take 600 mg by mouth 2 (two) times daily as needed for cough.    Marland Kitchen HYDROcodone-acetaminophen (NORCO) 10-325 MG tablet Take 1 tablet by mouth 2 (two) times daily as needed. 60 tablet 0  . ipratropium-albuterol (DUONEB) 0.5-2.5 (3) MG/3ML SOLN Take 3 mLs by nebulization.    Marland Kitchen loratadine (CLARITIN) 10 MG tablet TAKE 1 TABLET BY MOUTH EVERY DAY 30 tablet 5  . metFORMIN (GLUCOPHAGE) 500 MG tablet TAKE 1 TABLET BY MOUTH EVERY MORNING AS NEEDED FOR BLOOD SUGAR OVER 160 AND 1 TABLET BY MOUTH EVERY EVENING 60 tablet 1  . omeprazole (PRILOSEC) 20 MG capsule Take 1 capsule (20 mg total) by mouth daily. 90 capsule 3  . OXYGEN Inhale 4 L into the lungs.    . potassium chloride (K-DUR,KLOR-CON) 10 MEQ tablet Take 1 tablet (10 mEq total) daily by mouth. 90 tablet 3  . potassium chloride (K-DUR,KLOR-CON) 10 MEQ tablet TAKE 1 TABLET BY MOUTH ONCE DAILY 30 tablet 5  . senna (SENOKOT) 8.6 MG tablet Take 1 tablet by mouth 2 (two) times daily as needed for constipation.     . traMADol (ULTRAM) 50 MG tablet TAKE 2 TABLETS (100MG TOTAL) BY MOUTH EVERY 6 HOURS AS NEEDED FOR SEVERE PAIN 90 tablet 2  . TRELEGY ELLIPTA 100-62.5-25 MCG/INH AEPB INHALE 1 PUFF BY MOUTH ONCE DAILY 60 each 5  . triamterene-hydrochlorothiazide (MAXZIDE-25) 37.5-25 MG tablet Take 1 tablet by mouth daily.     Marland Kitchen UNABLE TO FIND Med Name: Cranial Prothesis  for Chemotherapy 1 each 0  . VENTOLIN HFA 108 (90 Base) MCG/ACT  inhaler INHALE 2 PUFFS BY MOUTH EVERY 6 HOURS AS NEEDED FOR WHEEZING OR SHORTNESS OF BREATH 18 each 4  . furosemide (LASIX) 40 MG tablet Take 1 tablet (40 mg total) by mouth daily for 3 doses. 3 tablet 0   Facility-Administered Medications Prior to Visit  Medication Dose Route Frequency Provider Last Rate Last Dose  . hyaluronate sodium (RADIAPLEXRX) gel   Topical Once Thea Silversmith, MD      . topical emolient (BIAFINE) emulsion   Topical PRN Thea Silversmith, MD        Review of Systems  Review of Systems  Constitutional: Positive for fever.  HENT: Positive for congestion and postnasal drip.   Respiratory: Positive for cough, shortness of breath and wheezing.   Cardiovascular: Negative.   Neurological: Positive for weakness.   Physical Exam  BP 120/62 (BP Location: Left Arm, Cuff Size: Normal)   Pulse (!) 120   Ht 5' 6"  (1.676 m)   Wt 167 lb (75.8 kg)   SpO2 92%   BMI 26.95 kg/m  Physical Exam  Constitutional: She is oriented to person, place, and time. She appears well-developed and well-nourished.  No acute distress   HENT:  Head: Normocephalic and atraumatic.  Eyes: Pupils are equal, round, and reactive to light. EOM are normal.  Neck: Normal range of motion. Neck supple.  Cardiovascular: Regular rhythm, normal heart sounds and intact distal pulses.  Tachycardic 120, regular   Pulmonary/Chest: Effort normal. No accessory muscle usage. No tachypnea. No respiratory distress. She has decreased breath sounds in the right middle field. She has no wheezes. She has no rhonchi. She has no rales.  On home oxygen, no accessory use. Able to speak in full sentences.   Abdominal: Soft.  Neurological: She is alert and oriented to person, place, and time.  Skin: Skin is warm and dry.  Vitals reviewed.    Lab Results:  CBC    Component Value Date/Time   WBC 13.3 (H) 11/08/2017 1611   RBC 4.21 11/08/2017 1611    HGB 11.9 (L) 11/08/2017 1611   HGB 11.8 11/01/2017 1309   HGB 12.7 04/07/2017 1015   HCT 36.7 11/08/2017 1611   HCT 39.0 04/07/2017 1015   PLT 262.0 11/08/2017 1611   PLT 266 11/01/2017 1309   PLT 203 04/07/2017 1015   MCV 87.3 11/08/2017 1611   MCV 90.1 04/07/2017 1015   MCH 28.3 11/01/2017 1309   MCHC 32.5 11/08/2017 1611   RDW 14.1 11/08/2017 1611   RDW 13.5 04/07/2017 1015   LYMPHSABS 1.7 11/08/2017 1611   LYMPHSABS 2.1 04/07/2017 1015   MONOABS 0.8 11/08/2017 1611   MONOABS 0.7 04/07/2017 1015   EOSABS 0.3 11/08/2017 1611   EOSABS 1.1 (H) 04/07/2017 1015   EOSABS 0.4 08/06/2015 1036   BASOSABS 0.1 11/08/2017 1611   BASOSABS 0.0 04/07/2017 1015    BMET    Component Value Date/Time   NA 136 11/08/2017 1611   NA 141 04/07/2017 1016   K 3.3 (L) 11/08/2017 1611   K 3.4 (L) 04/07/2017 1016   CL 92 (L) 11/08/2017 1611   CL 99 07/21/2012 1125   CO2 35 (H) 11/08/2017 1611   CO2 27 04/07/2017 1016   GLUCOSE 118 (H) 11/08/2017 1611   GLUCOSE 93 04/07/2017 1016   GLUCOSE 184 (H) 07/21/2012 1125   BUN 13 11/08/2017 1611   BUN 13.8 04/07/2017 1016   CREATININE 0.96 11/08/2017 1611   CREATININE 1.01 (H) 11/01/2017 1309   CREATININE 1.1 04/07/2017 1016  CALCIUM 9.5 11/08/2017 1611   CALCIUM 9.5 04/07/2017 1016   GFRNONAA 54 (L) 11/01/2017 1309   GFRAA >60 11/01/2017 1309    BNP    Component Value Date/Time   BNP 33.4 08/11/2017 2131    ProBNP    Component Value Date/Time   PROBNP 160 11/30/2016 1627    Imaging: Dg Chest 2 View  Result Date: 11/08/2017 CLINICAL DATA:  Cough and congestion. Fever intermittently for 3 months. Patient on antibiotics and steroids with no relief. History of lung cancer and left upper lobectomy. EXAM: CHEST - 2 VIEW COMPARISON:  October 25, 2017 FINDINGS: The opacity in the right mid lung is unchanged since Aug 11, 2017 and October 25, 2017 but worsened since June 09, 2017. No pneumothorax. No other interval changes. IMPRESSION: The  infiltrate in the right mid lung is unchanged since Aug 11, 2017 and October 25, 2017 but increased since February 2019. No other change. Electronically Signed   By: Dorise Bullion III M.D   On: 11/08/2017 16:18   Dg Chest 2 View  Result Date: 10/25/2017 CLINICAL DATA:  Cough for 2 months. Low-grade fever. History of lung cancer. EXAM: CHEST - 2 VIEW COMPARISON:  PA and lateral chest 06/09/2017 and 08/11/2017. CT chest 08/11/2017, 03/24/2017 and 11/10/2016. FINDINGS: The lungs are emphysematous. Masslike right upper lobe opacity is again seen and more dense than on the 06/09/2017 examination appears more prominent on the lateral view compared to the most recent plain films. The left lung is clear. Surgical clips projecting in the left hilum are unchanged. No pneumothorax or pleural effusion. The patient is status post upper thoracic vertebral augmentation. No acute or focal bony abnormality. IMPRESSION: Right upper lobe opacity demonstrates increased density since the prior plain films, particularly the 06/09/2017. This could represent infection superimposed on post treatment change but underlying carcinoma is possible. Recommend repeat PA and lateral films in 4-6 weeks after appropriate therapy. If the patient's chest film does not return to baseline of the 06/09/2017, chest CT with contrast or PET CT scan is recommended for further evaluation. Electronically Signed   By: Inge Rise M.D.   On: 10/25/2017 15:25   Ct Chest W Contrast  Result Date: 11/03/2017 CLINICAL DATA:  Right-sided lung cancer with radiation therapy and chemotherapy complete. Immunotherapy. Remote history of left-sided lung cancer with surgery only. Right upper lobe primary. EXAM: CT CHEST WITH CONTRAST TECHNIQUE: Multidetector CT imaging of the chest was performed during intravenous contrast administration. CONTRAST:  58m OMNIPAQUE IOHEXOL 300 MG/ML  SOLN COMPARISON:  Plain film 10/25/2017.  CT of 08/11/2017. FINDINGS:  Cardiovascular: Advanced aortic and branch vessel atherosclerosis. Normal heart size, without pericardial effusion. Lad coronary artery atherosclerosis. No central pulmonary embolism, on this non-dedicated study. Mediastinum/Nodes: No supraclavicular adenopathy. No mediastinal or hilar adenopathy. Lungs/Pleura: Minimal decrease in small right pleural effusion since the prior CT. Right upper lobe endobronchial cutoff, similar including on 57/7. Status post left upper lobectomy. Moderate centrilobular emphysema. Right upper lobe ground-glass and airspace disease, with slightly decreased airspace component and increased ground-glass component. Similarly, minimal decrease in inferior right upper lobe consolidation including on 68/7. Primarily similar scattered pulmonary nodular densities, including interstitial or ground-glass opacity in the left lower lobe on 58/7 and a mixed attenuation 4 mm nodule in the right lower lobe on 88/7. Nonspecific 6 mm soft tissue density nodule in the right lower lobe on 117/7, new. Right upper lobe pulmonary nodules x2 at 3-4 mm on 32/7 and 52/7. The right suprahilar soft tissue  density measures 3.0 x 2.6 cm on 55/2 and is felt to be similar to 3.1 x 2.5 cm on the prior. Upper Abdomen: 7 mm gallstone. Normal imaged portions of the liver, spleen, stomach pancreas, kidneys. Bilateral adrenal thickening and nodularity are similar. Abdominal aortic atherosclerosis. Musculoskeletal: Mild osteopenia. Upper right rib fragmentation and surrounding heterotopic ossification are again identified, possibly radiation induced. Avascular necrosis of the right humeral head. Compression deformities at T4 and T5 are unchanged, status post T4 vertebral augmentation. IMPRESSION: 1. Similar size of a right suprahilar soft tissue density lesion with obstruction of the right upper lobe bronchus. 2. Slightly improved right upper lobe aeration otherwise, with mixed airspace and ground-glass opacity persisting.  3. Primarily similar bilateral pulmonary nodules. A right lower lobe 6 mm nodule is new since the prior and warrants followup attention. 4. Decrease in small right pleural effusion. 5. No thoracic adenopathy. 6. Aortic atherosclerosis (ICD10-I70.0), coronary artery atherosclerosis and emphysema (ICD10-J43.9). 7. Cholelithiasis. Electronically Signed   By: Abigail Miyamoto M.D.   On: 11/03/2017 10:37     Assessment & Plan:  72 year old female, hx recurrent non-small cell lung ca (tx with Nivolumb), severe COPD and resp failure on home oxygen. Chief complaints of cough with color sputum, sob and wheezing. Associated nasal congestion. Patient is febrile in office today. EKG showing sinus tachycardia, which is not new and likely d/t acute illness and fever. Checking D-dimer to r/o PE, however, patient recent had CT with contrast that did not mention concern of pulmonary embolism. Patient is also having doppler study of BLE tomorrow to r/o DVT. If no improvement heart rate with resolution of symptoms would recommend patient follow up with PCP may need rate controlling medication. CXR obtained today showing opacity in right mid lung field which is unchanged from May but worse since February. No improvement in cough with doxy or levaquin. Starting augmentin x 10 days and obtaining sputum culture. Consider cipro when results come back if needed. She is safe to be treated outpatient, able to eat and drink. She is speaking in full sentences and does not appear to be in respiratory distress. Daughter is able to help at home. FU in 1 week. Aware to present to ED if develop chest pain, worsening fever or sob.   COPD with acute exacerbation (HCC) - Complains of cough with colored sputum, sob, wheeze and low grade temp. No significant improvement with doxy or levaquin - CXR today- opacity in the right mid lung is unchanged since Aug 11, 2017 and October 25, 2017 but worsened since June 09, 2017. No pneumothorax. No other  interval changes. - Concern for bacterial infection, underlying PNA. RX for augmentin sent and prednisone. Check sputum culture   Tachycardia EKG showing sinus tachycardia, likely d/t fever today in office of 100.6  Recommend pushing oral fluids   Urinary frequency - UA neg for leuks or nitrates      Martyn Ehrich, NP 11/08/2017

## 2017-11-08 NOTE — Assessment & Plan Note (Addendum)
-   Complains of cough with colored sputum, sob, wheeze and low grade temp. No significant improvement with doxy or levaquin - CXR today- opacity in the right mid lung is unchanged since Aug 11, 2017 and October 25, 2017 but worsened since June 09, 2017. No pneumothorax. No other interval changes. - Concern for bacterial infection, underlying PNA. RX for augmentin sent and prednisone. Check sputum culture

## 2017-11-09 ENCOUNTER — Ambulatory Visit (HOSPITAL_COMMUNITY)
Admission: RE | Admit: 2017-11-09 | Discharge: 2017-11-09 | Disposition: A | Discharge status: Home / Self Care | Payer: Medicare Other | Source: Ambulatory Visit | Attending: Internal Medicine | Admitting: Internal Medicine

## 2017-11-09 ENCOUNTER — Other Ambulatory Visit: Payer: Medicare Other

## 2017-11-09 DIAGNOSIS — C3490 Malignant neoplasm of unspecified part of unspecified bronchus or lung: Secondary | ICD-10-CM | POA: Diagnosis not present

## 2017-11-09 DIAGNOSIS — J441 Chronic obstructive pulmonary disease with (acute) exacerbation: Secondary | ICD-10-CM | POA: Diagnosis not present

## 2017-11-09 LAB — URINE CULTURE
MICRO NUMBER:: 90899334
Result:: NO GROWTH
SPECIMEN QUALITY:: ADEQUATE

## 2017-11-09 LAB — D-DIMER, QUANTITATIVE: D-Dimer, Quant: 1.82 mcg/mL FEU — ABNORMAL HIGH (ref <0.50)

## 2017-11-09 MED ORDER — PREDNISONE 50 MG PO TABS: ORAL_TABLET | ORAL | 0 refills | Status: DC

## 2017-11-09 NOTE — Progress Notes (Deleted)
*  Preliminary Results* Bilateral lower extremity venous duplex completed. Bilateral lower extremities are negative for deep vein thrombosis. There is no evidence of Baker's cyst bilaterally.  11/09/2017 11:37 AM Maudry Mayhew, BS, RVT, RDCS

## 2017-11-09 NOTE — Progress Notes (Signed)
*  Preliminary Results* Bilateral lower extremity venous duplex completed. Bilateral lower extremities are negative for deep vein thrombosis. There is no evidence of Baker's cyst bilaterally.  11/09/2017 11:27 AM Maudry Mayhew, BS, RVT, RDCS

## 2017-11-10 ENCOUNTER — Ambulatory Visit (HOSPITAL_COMMUNITY)
Admission: RE | Admit: 2017-11-10 | Discharge: 2017-11-10 | Disposition: A | Discharge status: Home / Self Care | Payer: Medicare Other | Source: Ambulatory Visit | Attending: Primary Care | Admitting: Primary Care

## 2017-11-10 ENCOUNTER — Telehealth: Payer: Self-pay | Admitting: Primary Care

## 2017-11-10 DIAGNOSIS — J439 Emphysema, unspecified: Secondary | ICD-10-CM | POA: Insufficient documentation

## 2017-11-10 DIAGNOSIS — J90 Pleural effusion, not elsewhere classified: Secondary | ICD-10-CM | POA: Diagnosis not present

## 2017-11-10 DIAGNOSIS — I700 Atherosclerosis of aorta: Secondary | ICD-10-CM | POA: Diagnosis not present

## 2017-11-10 DIAGNOSIS — R7989 Other specified abnormal findings of blood chemistry: Secondary | ICD-10-CM | POA: Diagnosis not present

## 2017-11-10 MED ORDER — IOPAMIDOL (ISOVUE-370) INJECTION 76%
INTRAVENOUS | Status: AC
  Administered 2017-11-10: 100 mL
  Filled 2017-11-10: qty 100

## 2017-11-10 MED ORDER — PREDNISONE 10 MG PO TABS: ORAL_TABLET | ORAL | 0 refills | Status: DC

## 2017-11-10 NOTE — Telephone Encounter (Signed)
Attempted to call patient today regarding rx for prednisone from last ov with BW 11/08/2017. I did not receive an answer at time of call, no VM option. X1  [name] sent rx for abx Augmentin on ov 11/08/17, and not for prednisone. In the notes states that [facility] sent abx and prednisone. Pt is wanting to know in her message to our office, why prednisone was not sent to pharmacy. DOD is CY will send message to him for review.  CY please advise

## 2017-11-10 NOTE — Telephone Encounter (Signed)
Prednisone 10 mg, # 20, 4 X 2 DAYS, 3 X 2 DAYS, 2 X 2 DAYS, 1 X 2 DAYS  

## 2017-11-10 NOTE — Telephone Encounter (Signed)
Spoke with the pt to verify pharm  Rx was sent to [facility]

## 2017-11-12 LAB — RESPIRATORY CULTURE OR RESPIRATORY AND SPUTUM CULTURE
MICRO NUMBER:: 90905548
RESULT: NORMAL
SPECIMEN QUALITY: ADEQUATE

## 2017-11-14 ENCOUNTER — Other Ambulatory Visit: Payer: Self-pay | Admitting: Internal Medicine

## 2017-11-16 ENCOUNTER — Other Ambulatory Visit: Payer: Self-pay

## 2017-11-16 ENCOUNTER — Encounter: Payer: Self-pay | Admitting: Primary Care

## 2017-11-16 ENCOUNTER — Ambulatory Visit (INDEPENDENT_AMBULATORY_CARE_PROVIDER_SITE_OTHER): Payer: Medicare Other | Admitting: Primary Care

## 2017-11-16 VITALS — BP 140/60 | HR 116 | Ht 66.0 in | Wt 170.4 lb

## 2017-11-16 DIAGNOSIS — R000 Tachycardia, unspecified: Secondary | ICD-10-CM

## 2017-11-16 DIAGNOSIS — C3411 Malignant neoplasm of upper lobe, right bronchus or lung: Secondary | ICD-10-CM | POA: Diagnosis not present

## 2017-11-16 DIAGNOSIS — J441 Chronic obstructive pulmonary disease with (acute) exacerbation: Secondary | ICD-10-CM | POA: Diagnosis not present

## 2017-11-16 NOTE — Patient Instructions (Addendum)
Repeat CXR and labs in 1 week after finishing abx and prednisone   Please follow up with PCP regarding elevated HR and BP  Continue flutter valve   O2 sat 88% RA on ambulation, recovered to 94% RA at rest. Requalified for home oxygen with [employer].

## 2017-11-16 NOTE — Progress Notes (Signed)
@Patient  ID: Margaretmary Lombard, female    DOB: Sep 28, 1945, 72 y.o.   MRN: [id number]  Chief Complaint  Patient presents with  . Follow-up    liked flutter valve-cough with clear to white mucous-rattling-    Referring provider: [name], MD  HPI: 72 year old female. Hx severe COPD, chronic hypoxemic resp failure, recurrent non-small cell lung cancer stage IIA adenocarcinoma, initially diagnosed as unresectable in 2012 s/p chemotherapy and immunotherapy (Currently undergoing immunotherapy tx with Nivolumb). Hx left lobectomy. Patient of [name], last seen on 09/08/17. Patient saw [name] with oncology on July 25th. Patient continues with Nivolumab 234m IV every 2 weeks.  Repeat CHEST CT W/contrast reviewed by Dr. MJulien Nordmannshowing no concerning findings for progression. There is a new RLL nodule 668m Recommended repeating every 3 months. Additional complaints of knots in bilateral legs, ordered for a Doppler ultrasound study of lower extremities.           10/25/2017 Patient has called a number of times this past month d/t not feeling well, complaints include nasal congestion, sneezing, cough with green mucus. Sent RX for azithromycin, patient requested abx be changed to something stronger. Started on doxycycline end of June. Called on 7/5 with acute complaints of head cold and nose sores. Continued taking Flonase and Claritin. RX for neosporin ointment and omnicef sent. Third call on 7/9 for increased sob, chest congestion and cough with some colored sputum. Given RX for prednisone x5days. Called again on 7/11 for issues with head and chest congestion, taking mucinex. Also states she has sores in her nose and low grade teomp. Given RX levaquin, started on 7/15. Instructed to follow up in office. Presents today for eval for continued complaints of head/chest congestion, nose sores and low grade temp. She took first dose of levaquin yesterday. Cough is productive with green/yellow sputum.  Associated sinus and ear congestin. Taking mucinex and flonase. Continues Trelegy and albuterol prn.   11/08/2017  Presents today for 2 week follow up with her daughter. Patient is in wheelchair with home oxygen. Appears comfortable. Feels a little better but complains of continued cough, nasal congestion and weakness. Levaquin and prednisone did help initially. Cough is worse at night. Reports orange colored sputum. Associated sinus problems and nasal drainage. Complains of sore, dried up nose. Low grade temp 99 at home. States that she is sob all the time, wheezing in am/pm. Requiring rescue inhaler 2-3 times a day. Continues Trelegy. Used nebulizer yesterday once. Taking mucinex.   Review testing: >>Chest CT (11/03/17)- IMPRESSION: 1. Similar size of a right suprahilar soft tissue density lesion with obstruction of the right upper lobe bronchus. 2. Slightly improved right upper lobe aeration otherwise, with mixed airspace and ground-glass opacity persisting. 3. Primarily similar bilateral pulmonary nodules. A right lower lobe 6 mm nodule is new since the prior and warrants followup attention. 4. Decrease in small right pleural effusion.  >>CXR (11/08/17) - The infiltrate in the right mid lung is unchanged since Aug 11, 2017 and October 25, 2017 but increased since February 2019. No other Change.  >>CTA (11/10/17)-  IMPRESSION: 1. No evidence of pulmonary emboli. 2. Stable changes bilaterally similar to that seen on the exam from 7 days previous. 3. Slight increase in size right-sided pleural effusion when compared with the recent exam.   11/17/2017 Patient presents today for 1 week follow up for COPD with acute exacerbation. Symptoms have been persistent with no improvement with doxycyline or Levaquin. She was started on  Augmentin and prednisone taper last week, has a few days left. She feels better today, continues to have sputum production but color has improved to clear. States that the flutter  valve has really helped her. Sputum culture negative showing normal oropharyngeal flora. D-dimer eleavted, CTA negative PE. Family concerned with new 10m in RLL on CT scan in July, will discuss with Dr. BLamonte Sakai Oncology states they are following with serial Chest CT scans q3 months. Continues immunotherapy q2weeks.   Patient needs re-certification for home oxygen. Uses [employer].  Patient ambulated in office with documented O2 sat desaturation of 88% RA, recovered to 92-94% RA with rest.   Allergies  Allergen Reactions  . Iohexol Shortness Of Breath and Other (See Comments)    "burns me up inside"    Pt does fine with 13 hour prep  01/17/12  . Other     CAN'T STAND THE SMELL OF PURE BLEACH; "HAVE TO BACK UP AND POUR AND DON'T BREATH IT TIL i GET CAP BACK ON; DILUTE IT TO SPRAY"  . Anoro Ellipta [Umeclidinium-Vilanterol] Swelling    Swelling of lips and mouth  . Codeine Itching and Nausea And Vomiting    REACTION: GI upset  . Protonix [Pantoprazole Sodium] Diarrhea    Immunization History  Administered Date(s) Administered  . Influenza Split 02/08/2007, 03/19/2011, 01/27/2012  . Influenza Whole 01/02/2010  . Influenza, High Dose Seasonal PF 02/03/2017  . Influenza,inj,Quad PF,6+ Mos 12/27/2012, 02/12/2014, 01/22/2016  . Pneumococcal Conjugate-13 02/14/2017  . Pneumococcal Polysaccharide-23 03/27/2013  . Tdap 02/14/2017    Past Medical History:  Diagnosis Date  . Adenocarcinoma, lung ([place]    upper left lobe  . Anemia in neoplastic disease   . Anxiety   . Arthritis    "all over my body"  . Asthma   . Asymptomatic varicose veins   . Atherosclerosis of native arteries of the extremities, unspecified   . Atrial fibrillation ([facility]   . Bronchial pneumonia    history  . 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 Chemotherapy adverse reaction 09/15/11   "makes me light headed and pass out; this is 3rd blood transfusion since going thru it"  . CHF (congestive heart failure) ([facility]   .  Chronic airway obstruction, not elsewhere classified   . Chronic fatigue 08/19/2015  . Chronic sinusitis   . Complication of anesthesia    slow to wake up  . COPD with emphysema ([facility]   . Coronary artery disease    Dr. CBurt Knackhad stress test done  . DDD (degenerative disc disease), lumbar   . Depressive disorder, not elsewhere classified   . Diseases of lips   . Disorder of bone and cartilage, unspecified   . DVT (deep venous thrombosis) ([facility]   . Fibromyalgia   . Functional urinary incontinence   . GERD (gastroesophageal reflux disease)   . Hard of hearing, right   . Herpes zoster without mention of complication   . Hip pain, chronic 09/17/2014  . History of blood clots    "2 in my aorta"  . History of blood transfusion   . History of bronchitis   . History of echocardiogram    Echo 11/18: mild LVH, EF 60-65, no RWMA, Gr 1 DD, Ao Sclerosis, mild AI, RVH with low nl RSVF, trivial eff  . Hx of radiation therapy 04/28/11 -06/08/11   right upper lobe-lung  . Hyperlipidemia   . Hypertension   . Kidney infection    "related to chemo"  .  Lumbago   . Malignant neoplasm of bronchus and lung, unspecified site   . Mental disorder   . Muscle weakness (generalized)   . Non-small cell lung cancer ([place]) 03/31/2011   Adenocarcinoma Rt upper lobe mass  . Osteoarthrosis, unspecified whether generalized or localized, unspecified site   . Osteoporosis   . Other abnormal blood chemistry   . Other and unspecified hyperlipidemia   . PAD (peripheral artery disease) ([place])   . Pain in thoracic spine   . 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 Primary cancer of right upper lobe of lung ([place]) 02/08/2011   Biopsy Rt upper lobe mass 03/31/11>>adenocarcinoma DIAGNOSIS: Stage IIB/IIIA non-small cell lung cancer consistent with adenocarcinoma diagnosed in December 2012.   PRIOR THERAPY:  1) Status post radiation therapy to the right upper lobe lung mass completed on 06/04/2011 under the care of Dr. Pablo Ledger.   2) Systemic chemotherapy with carboplatin for AUC of 5 and Alimta 500 mg/M2 giving every 3   . Reflux   . Reflux esophagitis   . Respiratory failure with hypoxia 01/02/2010  . Restless legs syndrome (RLS)   . Scoliosis   . Screening for thyroid disorder   . Shortness of breath    "anytime really"  . Sinus headache    "all the time"  . Spasm of muscle   . Syncope and collapse   . Type II or unspecified type diabetes mellitus with neurological manifestations, uncontrolled(250.62)   . Type II or unspecified type diabetes mellitus without mention of complication, not stated as uncontrolled   . Type II or unspecified type diabetes mellitus without mention of complication, not stated as uncontrolled   . Unspecified constipation   . Unspecified essential hypertension   . Unspecified hearing loss   . Unspecified hereditary and idiopathic peripheral neuropathy   . Unspecified vitamin D deficiency     Tobacco History: Social History   Tobacco Use  Smoking Status Former Smoker  . Packs/day: 1.00  . Years: 40.00  . Pack years: 40.00  . Types: Cigarettes  . Last attempt to quit: 02/10/1998  . Years since quitting: 19.7  Smokeless Tobacco Never Used   Counseling given: Not Answered   Outpatient Medications Prior to Visit  Medication Sig Dispense Refill  . ACCU-CHEK AVIVA PLUS test strip USE AS DIRECTED TO CHECK BLOOD SUGAR THREE TIMES DAILY 100 each 2  . ACCU-CHEK SOFTCLIX LANCETS lancets by Other route. Check blood sugar twice daily. Dx: E11.9, E11.49    . albuterol (PROVENTIL HFA;VENTOLIN HFA) 108 (90 Base) MCG/ACT inhaler Inhale 2 puffs into the lungs every 6 (six) hours as needed for wheezing or shortness of breath. 1 Inhaler 6  . albuterol (PROVENTIL) (2.5 MG/3ML) 0.083% nebulizer solution USE 1 VIAL VIA NEBULIZER EVERY 6 HOURS AS NEEDED FOR WHEEZING OR SHORTNESS OF BREATH 75 mL 3  . ALPRAZolam (XANAX) 1 MG tablet TAKE 1 TABLET BY MOUTH 2 TIMES A DAY AS NEEDED FOR ANXIETY 60  tablet 2  . amoxicillin-clavulanate (AUGMENTIN) 875-125 MG tablet Take 1 tablet by mouth 2 (two) times daily for 10 days. 20 tablet 0  . aspirin 81 MG tablet Take 81 mg by mouth daily.    Marland Kitchen atorvastatin (LIPITOR) 20 MG tablet Take 1 tablet (20 mg total) by mouth daily. 90 tablet 3  . Blood Glucose Monitoring Suppl (ACCU-CHEK AVIVA PLUS) w/Device KIT Use as directed to check blood sugar Dx: E11.9, E11.49 1 kit 0  . Calcium Carbonate-Vit D-Min (CALTRATE PLUS PO) Take 1 tablet by  mouth daily.    . fluticasone (FLONASE) 50 MCG/ACT nasal spray SHAKE LIQUID WELL AND USE 2 SPRAYS IN EACH NOSTRIL TWICE DAILY (Patient taking differently: SHAKE LIQUID WELL AND USE 2 SPRAYS IN EACH NOSTRIL DAILY) 16 g 5  . folic acid (FOLVITE) 1 MG tablet Take 1 tablet (1 mg total) by mouth daily. 30 tablet 4  . guaiFENesin (MUCINEX) 600 MG 12 hr tablet Take 600 mg by mouth 2 (two) times daily as needed for cough.    Marland Kitchen HYDROcodone-acetaminophen (NORCO) 10-325 MG tablet Take 1 tablet by mouth 2 (two) times daily as needed. 60 tablet 0  . ipratropium-albuterol (DUONEB) 0.5-2.5 (3) MG/3ML SOLN Take 3 mLs by nebulization.    Marland Kitchen loratadine (CLARITIN) 10 MG tablet TAKE 1 TABLET BY MOUTH EVERY DAY 30 tablet 5  . metFORMIN (GLUCOPHAGE) 500 MG tablet TAKE 1 TABLET BY MOUTH EVERY MORNING AS NEEDED FOR BLOOD SUGAR OVER 160 AND 1 TABLET BY MOUTH EVERY EVENING 60 tablet 1  . omeprazole (PRILOSEC) 20 MG capsule Take 1 capsule (20 mg total) by mouth daily. 90 capsule 3  . OXYGEN Inhale 4 L into the lungs.    . potassium chloride (K-DUR,KLOR-CON) 10 MEQ tablet Take 1 tablet (10 mEq total) daily by mouth. 90 tablet 3  . potassium chloride (K-DUR,KLOR-CON) 10 MEQ tablet TAKE 1 TABLET BY MOUTH ONCE DAILY 30 tablet 5  . predniSONE (DELTASONE) 10 MG tablet Take 4 tabs po daily x 2 days; then 3 tabs for 2 days; then 2 tabs for 2 days; then 1 tab for 2 days 20 tablet 0  . predniSONE (DELTASONE) 10 MG tablet 4 x 2 days, 3 x 2 days, 2 x 2 days, 1 x 2  days then stop 20 tablet 0  . predniSONE (DELTASONE) 50 MG tablet Please take 65m 13hours ,7 hours ,1hour prior to study. 3 tablet 0  . Respiratory Therapy Supplies (FLUTTER) DEVI Use a directed 1 each 0  . senna (SENOKOT) 8.6 MG tablet Take 1 tablet by mouth 2 (two) times daily as needed for constipation.     . traMADol (ULTRAM) 50 MG tablet TAKE 2 TABLETS (100MG TOTAL) BY MOUTH EVERY 6 HOURS AS NEEDED FOR SEVERE PAIN 90 tablet 2  . TRELEGY ELLIPTA 100-62.5-25 MCG/INH AEPB INHALE 1 PUFF BY MOUTH ONCE DAILY 60 each 5  . triamterene-hydrochlorothiazide (MAXZIDE-25) 37.5-25 MG tablet Take 1 tablet by mouth daily.     .Marland KitchenUNABLE TO FIND Med Name: Cranial Prothesis for Chemotherapy 1 each 0  . VENTOLIN HFA 108 (90 Base) MCG/ACT inhaler INHALE 2 PUFFS BY MOUTH EVERY 6 HOURS AS NEEDED FOR WHEEZING OR SHORTNESS OF BREATH 18 each 4  . furosemide (LASIX) 40 MG tablet Take 1 tablet (40 mg total) by mouth daily for 3 doses. 3 tablet 0   Facility-Administered Medications Prior to Visit  Medication Dose Route Frequency Provider Last Rate Last Dose  . hyaluronate sodium (RADIAPLEXRX) gel   Topical Once WThea Silversmith MD      . topical emolient (BIAFINE) emulsion   Topical PRN WThea Silversmith MD        Review of Systems  Review of Systems  Constitutional: Negative for fever.  HENT: Positive for congestion and postnasal drip. Negative for sinus pressure and sinus pain.   Respiratory: Positive for cough and shortness of breath. Negative for wheezing.        Chronic cough with clear mucus production. SOB with activity  Genitourinary: Negative for dysuria.  Skin: Negative.  Physical Exam  BP 140/60 (BP Location: Right Arm, Cuff Size: Normal)   Pulse (!) 116   Ht 5' 6"  (1.676 m)   Wt 170 lb 6.4 oz (77.3 kg)   SpO2 94%   BMI 27.50 kg/m  Physical Exam  Constitutional: She is oriented to person, place, and time. She appears well-developed and well-nourished.  HENT:  Head: Normocephalic and  atraumatic.  Eyes: Pupils are equal, round, and reactive to light. EOM are normal.  Neck: Normal range of motion. Neck supple.  Cardiovascular: Regular rhythm.  Regular rhythm, increased HR 116  Pulmonary/Chest: Effort normal. She has no wheezes.  LSC, on home oxygen. Some accessory use  Musculoskeletal: Normal range of motion.  Neurological: She is alert and oriented to person, place, and time.  Skin: Skin is warm and dry.  Psychiatric: She has a normal mood and affect. Her behavior is normal. Judgment and thought content normal.    Lab Results:  CBC    Component Value Date/Time   WBC 13.3 (H) 11/08/2017 1611   RBC 4.21 11/08/2017 1611   HGB 11.9 (L) 11/08/2017 1611   HGB 11.8 11/01/2017 1309   HGB 12.7 04/07/2017 1015   HCT 36.7 11/08/2017 1611   HCT 39.0 04/07/2017 1015   PLT 262.0 11/08/2017 1611   PLT 266 11/01/2017 1309   PLT 203 04/07/2017 1015   MCV 87.3 11/08/2017 1611   MCV 90.1 04/07/2017 1015   MCH 28.3 11/01/2017 1309   MCHC 32.5 11/08/2017 1611   RDW 14.1 11/08/2017 1611   RDW 13.5 04/07/2017 1015   LYMPHSABS 1.7 11/08/2017 1611   LYMPHSABS 2.1 04/07/2017 1015   MONOABS 0.8 11/08/2017 1611   MONOABS 0.7 04/07/2017 1015   EOSABS 0.3 11/08/2017 1611   EOSABS 1.1 (H) 04/07/2017 1015   EOSABS 0.4 08/06/2015 1036   BASOSABS 0.1 11/08/2017 1611   BASOSABS 0.0 04/07/2017 1015    BMET    Component Value Date/Time   NA 136 11/08/2017 1611   NA 141 04/07/2017 1016   K 3.3 (L) 11/08/2017 1611   K 3.4 (L) 04/07/2017 1016   CL 92 (L) 11/08/2017 1611   CL 99 07/21/2012 1125   CO2 35 (H) 11/08/2017 1611   CO2 27 04/07/2017 1016   GLUCOSE 118 (H) 11/08/2017 1611   GLUCOSE 93 04/07/2017 1016   GLUCOSE 184 (H) 07/21/2012 1125   BUN 13 11/08/2017 1611   BUN 13.8 04/07/2017 1016   CREATININE 0.96 11/08/2017 1611   CREATININE 1.01 (H) 11/01/2017 1309   CREATININE 1.1 04/07/2017 1016   CALCIUM 9.5 11/08/2017 1611   CALCIUM 9.5 04/07/2017 1016   GFRNONAA 54 (L)  11/01/2017 1309   GFRAA >60 11/01/2017 1309    BNP    Component Value Date/Time   BNP 33.4 08/11/2017 2131    ProBNP    Component Value Date/Time   PROBNP 160 11/30/2016 1627    Imaging: Dg Chest 2 View  Result Date: 11/08/2017 CLINICAL DATA:  Cough and congestion. Fever intermittently for 3 months. Patient on antibiotics and steroids with no relief. History of lung cancer and left upper lobectomy. EXAM: CHEST - 2 VIEW COMPARISON:  October 25, 2017 FINDINGS: The opacity in the right mid lung is unchanged since Aug 11, 2017 and October 25, 2017 but worsened since June 09, 2017. No pneumothorax. No other interval changes. IMPRESSION: The infiltrate in the right mid lung is unchanged since Aug 11, 2017 and October 25, 2017 but increased since February 2019. No other change.  Electronically Signed   By: Dorise Bullion III M.D   On: 11/08/2017 16:18   Dg Chest 2 View  Result Date: 10/25/2017 CLINICAL DATA:  Cough for 2 months. Low-grade fever. History of lung cancer. EXAM: CHEST - 2 VIEW COMPARISON:  PA and lateral chest 06/09/2017 and 08/11/2017. CT chest 08/11/2017, 03/24/2017 and 11/10/2016. FINDINGS: The lungs are emphysematous. Masslike right upper lobe opacity is again seen and more dense than on the 06/09/2017 examination appears more prominent on the lateral view compared to the most recent plain films. The left lung is clear. Surgical clips projecting in the left hilum are unchanged. No pneumothorax or pleural effusion. The patient is status post upper thoracic vertebral augmentation. No acute or focal bony abnormality. IMPRESSION: Right upper lobe opacity demonstrates increased density since the prior plain films, particularly the 06/09/2017. This could represent infection superimposed on post treatment change but underlying carcinoma is possible. Recommend repeat PA and lateral films in 4-6 weeks after appropriate therapy. If the patient's chest film does not return to baseline of the  06/09/2017, chest CT with contrast or PET CT scan is recommended for further evaluation. Electronically Signed   By: Inge Rise M.D.   On: 10/25/2017 15:25   Ct Chest W Contrast  Result Date: 11/03/2017 CLINICAL DATA:  Right-sided lung cancer with radiation therapy and chemotherapy complete. Immunotherapy. Remote history of left-sided lung cancer with surgery only. Right upper lobe primary. EXAM: CT CHEST WITH CONTRAST TECHNIQUE: Multidetector CT imaging of the chest was performed during intravenous contrast administration. CONTRAST:  69m OMNIPAQUE IOHEXOL 300 MG/ML  SOLN COMPARISON:  Plain film 10/25/2017.  CT of 08/11/2017. FINDINGS: Cardiovascular: Advanced aortic and branch vessel atherosclerosis. Normal heart size, without pericardial effusion. Lad coronary artery atherosclerosis. No central pulmonary embolism, on this non-dedicated study. Mediastinum/Nodes: No supraclavicular adenopathy. No mediastinal or hilar adenopathy. Lungs/Pleura: Minimal decrease in small right pleural effusion since the prior CT. Right upper lobe endobronchial cutoff, similar including on 57/7. Status post left upper lobectomy. Moderate centrilobular emphysema. Right upper lobe ground-glass and airspace disease, with slightly decreased airspace component and increased ground-glass component. Similarly, minimal decrease in inferior right upper lobe consolidation including on 68/7. Primarily similar scattered pulmonary nodular densities, including interstitial or ground-glass opacity in the left lower lobe on 58/7 and a mixed attenuation 4 mm nodule in the right lower lobe on 88/7. Nonspecific 6 mm soft tissue density nodule in the right lower lobe on 117/7, new. Right upper lobe pulmonary nodules x2 at 3-4 mm on 32/7 and 52/7. The right suprahilar soft tissue density measures 3.0 x 2.6 cm on 55/2 and is felt to be similar to 3.1 x 2.5 cm on the prior. Upper Abdomen: 7 mm gallstone. Normal imaged portions of the liver, spleen,  stomach pancreas, kidneys. Bilateral adrenal thickening and nodularity are similar. Abdominal aortic atherosclerosis. Musculoskeletal: Mild osteopenia. Upper right rib fragmentation and surrounding heterotopic ossification are again identified, possibly radiation induced. Avascular necrosis of the right humeral head. Compression deformities at T4 and T5 are unchanged, status post T4 vertebral augmentation. IMPRESSION: 1. Similar size of a right suprahilar soft tissue density lesion with obstruction of the right upper lobe bronchus. 2. Slightly improved right upper lobe aeration otherwise, with mixed airspace and ground-glass opacity persisting. 3. Primarily similar bilateral pulmonary nodules. A right lower lobe 6 mm nodule is new since the prior and warrants followup attention. 4. Decrease in small right pleural effusion. 5. No thoracic adenopathy. 6. Aortic atherosclerosis (ICD10-I70.0), coronary artery atherosclerosis and  emphysema (ICD10-J43.9). 7. Cholelithiasis. Electronically Signed   By: Abigail Miyamoto M.D.   On: 11/03/2017 10:37   Ct Angio Chest W/cm &/or Wo Cm  Result Date: 11/10/2017 CLINICAL DATA:  Right-sided chest pain for 2 weeks with elevated D-dimer EXAM: CT ANGIOGRAPHY CHEST WITH CONTRAST TECHNIQUE: Multidetector CT imaging of the chest was performed using the standard protocol during bolus administration of intravenous contrast. Multiplanar CT image reconstructions and MIPs were obtained to evaluate the vascular anatomy. CONTRAST:  100 mL Isovue 370. COMPARISON:  11/03/2017 FINDINGS: Cardiovascular: Thoracic aorta demonstrates atherosclerotic calcifications without aneurysmal dilatation or dissection. Mild coronary calcifications are noted. No significant cardiac enlargement is seen. The pulmonary artery shows a normal branching pattern with the exception of changes consistent with the known left upper lobectomy. No filling defects to suggest pulmonary emboli are identified. Mediastinum/Nodes:  The thoracic inlet is within normal limits. Scattered small mediastinal lymph nodes are again identified and stable. No sizable hilar adenopathy is seen. Lungs/Pleura: Persistent right suprahilar mass lesion is noted with occlusion of the right upper lobe bronchus. The overall appearance is stable from 7 days previous. Previously seen solid and ground-glass density in the right upper lobe is again identified and stable. Increase in the degree of right-sided pleural effusion is noted when compared with the prior exam. No new focal infiltrate is seen. Emphysematous changes are again identified. Upper Abdomen: Cholelithiasis is noted without complicating factors. Musculoskeletal: Degenerative changes of the thoracic spine are noted. Changes along the right chest wall are again seen similar to that noted on the recent exam. No new focal bony abnormality is seen. Stable changes of vertebral augmentation at T4 with T5 compression fracture noted. Review of the MIP images confirms the above findings. IMPRESSION: No evidence of pulmonary emboli. Stable changes bilaterally similar to that seen on the exam from 7 days previous. Slight increase in size right-sided pleural effusion when compared with the recent exam. Aortic Atherosclerosis (ICD10-I70.0) and Emphysema (ICD10-J43.9). Electronically Signed   By: Inez Catalina M.D.   On: 11/10/2017 09:27     Assessment & Plan:   COPD with acute exacerbation ([place]) - Improved with Augmentin; Appears at baseline, continues to have sputum production however color has cleared - Sputum culture with normal growth  - Will recheck CXR and labs 1 week after completion of abx/steriod  Tachycardia - EKG in July showing Sinus tachycardia, compared to EKG in 2017 this is similar and not new  - Needs to fu with pcp for management   Primary cancer of right upper lobe of lung ([place]) Recurrent non-small cell lung cancer stage IIA adenocarcinoma, initially diagnosed as unresectable in  2012 s/p chemotherapy and immunotherapy. Currently undergoing immunotherapy tx with Nivolumb q2 weeks. Hx left lobectomy.   Repeat CHEST CT W/contrast reviewed by [name] showing no concerning findings for progression. There is a new RLL nodule 30m. Recommended repeating every 3 months. Family is concerned with new finding, will discuss with Dr. BLamonte Sakai   Routine follow-up with Dr. BLamonte Sakaiin 2-3 months    EMartyn Ehrich NP 11/17/2017

## 2017-11-17 ENCOUNTER — Other Ambulatory Visit: Payer: Self-pay

## 2017-11-17 ENCOUNTER — Encounter: Payer: Self-pay | Admitting: Primary Care

## 2017-11-17 ENCOUNTER — Telehealth: Payer: Self-pay | Admitting: Primary Care

## 2017-11-17 ENCOUNTER — Telehealth: Payer: Self-pay | Admitting: Medical Oncology

## 2017-11-17 NOTE — Assessment & Plan Note (Signed)
Recurrent non-small cell lung cancer stage IIA adenocarcinoma, initially diagnosed as unresectable in 2012 s/p chemotherapy and immunotherapy. Currently undergoing immunotherapy tx with Nivolumb q2 weeks. Hx left lobectomy.   Repeat CHEST CT W/contrast reviewed by [name] showing no concerning findings for progression. There is a new RLL nodule 59mm. Recommended repeating every 3 months. Family is concerned with new finding, will discuss with [name]

## 2017-11-17 NOTE — Assessment & Plan Note (Addendum)
-   Improved with Augmentin; Appears at baseline, continues to have sputum production however color has cleared - Sputum culture with normal growth  - Will recheck CXR and labs 1 week after completion of abx/steriod

## 2017-11-17 NOTE — Telephone Encounter (Signed)
[name],  I saw your patient a couple times in the office for acute copd exac symptoms. She had called the office numerous times and was treated empirically with doxy and levaquin with no improvement. I started her on augmentin and she appears better yesterday. Sputum culture negative. Mucus is clear now and she does not look sob. HR is elevated, regular. CTA neg PE. I will have her fu with pcp.   Would you mind reviewing the below image results. Patient follows with oncology for recurrent non-small cell lung ca. Per Dr. Worthy Flank note he did not feel there was progression in her disease. CT did show a new RLL nodule that she was unaware about. They are checking serial CTs q6mon but family was concerned about this new finding. Also, CXR showed unchanged infiltrate in right mid lobe increased since feb.   Thanks, Geraldo Pitter NP  Chest CT (11/03/17)- IMPRESSION: 1. Similar size of a right suprahilar soft tissue density lesion with obstruction of the right upper lobe bronchus. 2. Slightly improved right upper lobe aeration otherwise, with mixed airspace and ground-glass opacity persisting. 3. Primarily similar bilateral pulmonary nodules. A right lower lobe 6 mm nodule is new since the prior and warrants followup attention. 4. Decrease in small right pleural effusion.  CXR (11/08/17) - The infiltrate in the right mid lung is unchanged since Aug 11, 2017 and October 25, 2017 but increased since February 2019. No other Change.  CTA (11/10/17)-  IMPRESSION: 1. No evidence of pulmonary emboli. 2. Stable changes bilaterally similar to that seen on the exam from 7 days previous. 3. Slight increase in size right-sided pleural effusion when compared with the recent exam.

## 2017-11-17 NOTE — Addendum Note (Signed)
Addended by: Karmen Stabs on: 11/17/2017 11:24 AM   Modules accepted: Orders

## 2017-11-17 NOTE — Assessment & Plan Note (Signed)
-   EKG in July showing Sinus tachycardia, compared to EKG in 2017 this is similar and not new  - Needs to fu with pcp for management

## 2017-11-17 NOTE — Telephone Encounter (Signed)
Leg Pain-"What can I do for the pain in my legs"/. I instructed pt to contact PCP .

## 2017-11-18 ENCOUNTER — Telehealth: Payer: Self-pay

## 2017-11-18 ENCOUNTER — Ambulatory Visit: Payer: Medicare Other | Admitting: Internal Medicine

## 2017-11-18 NOTE — Telephone Encounter (Signed)
Returned patient's call. Having some leg pain recently. Recommended she start with her PCP. Will schedule appointment with [place] if needed.

## 2017-11-21 NOTE — Telephone Encounter (Signed)
Thanks for this info, very helpful.  I think I need to set up an OV to see her, review her sx, imaging plans. I can then try to coordinate with Dr Julien Nordmann.

## 2017-11-22 NOTE — Telephone Encounter (Signed)
Spoke with pt. She has been scheduled to see RB on 11/30/17 at 3:45pm. Nothing further was needed.

## 2017-11-23 ENCOUNTER — Ambulatory Visit: Payer: Medicare Other | Admitting: Internal Medicine

## 2017-11-28 ENCOUNTER — Encounter: Payer: Self-pay | Admitting: Internal Medicine

## 2017-11-28 ENCOUNTER — Ambulatory Visit (INDEPENDENT_AMBULATORY_CARE_PROVIDER_SITE_OTHER): Payer: Medicare Other | Admitting: Internal Medicine

## 2017-11-28 VITALS — BP 152/78 | HR 122 | Temp 98.8°F | Ht 66.0 in | Wt 165.0 lb

## 2017-11-28 DIAGNOSIS — E1142 Type 2 diabetes mellitus with diabetic polyneuropathy: Secondary | ICD-10-CM

## 2017-11-28 DIAGNOSIS — F419 Anxiety disorder, unspecified: Secondary | ICD-10-CM | POA: Diagnosis not present

## 2017-11-28 DIAGNOSIS — J9611 Chronic respiratory failure with hypoxia: Secondary | ICD-10-CM | POA: Diagnosis not present

## 2017-11-28 DIAGNOSIS — F32A Depression, unspecified: Secondary | ICD-10-CM

## 2017-11-28 DIAGNOSIS — F329 Major depressive disorder, single episode, unspecified: Secondary | ICD-10-CM | POA: Diagnosis not present

## 2017-11-28 DIAGNOSIS — I10 Essential (primary) hypertension: Secondary | ICD-10-CM | POA: Diagnosis not present

## 2017-11-28 DIAGNOSIS — R000 Tachycardia, unspecified: Secondary | ICD-10-CM

## 2017-11-28 MED ORDER — ALPRAZOLAM 1 MG PO TABS: ORAL_TABLET | ORAL | 2 refills | Status: DC

## 2017-11-28 NOTE — Progress Notes (Signed)
Subjective:    Patient ID: [name], female    DOB: 10/11/1945, 72 y.o.   MRN: [id number]  HPI  Here to f/u with c/o > 3 wks onset feels lightheaded and weak. Has persistent sinus tachy per ECG 7/30, helped somewhat per benzo per pt. Also with feet and ankle swelling, and c/o pain to ends of all toes and fingers.  Drinks plenty of fluids, takes lasix daily as prescribed but also taking maxide daily as well.   Feels overall jittery, not really palpiations.  Sugars have overall been worse recently as well - low of 97 in the AM and high of 287 later in the evenings except she did have one 391 last wk.  Not taking the metformin as prescribed, will hld if sugars are low in the AM, and only taking for > 160 in the PM (which is usually every day).  BP at ome have been mostly 110-130's sbp despite todays.  Pt denies chest pain, increased sob or doe, wheezing, orthopnea, PND, or syncope.  Pt denies polydipsia, polyuria,  Wt Readings from Last 3 Encounters:  11/28/17 165 lb (74.8 kg)  11/16/17 170 lb 6.4 oz (77.3 kg)  11/08/17 167 lb (75.8 kg)   Temp Readings from Last 3 Encounters:  11/28/17 98.8 F (37.1 C) (Oral)  11/03/17 98.2 F (36.8 C) (Oral)  10/25/17 99.9 F (37.7 C) (Oral)   BP Readings from Last 3 Encounters:  11/28/17 (!) 152/78  11/16/17 140/60  11/08/17 120/62   Pulse Readings from Last 3 Encounters:  11/28/17 (!) 122  11/16/17 (!) 116  11/08/17 (!) 120   Past Medical History:  Diagnosis Date  . Adenocarcinoma, lung ([place])    upper left lobe  . Anemia in neoplastic disease   . Anxiety   . Arthritis    "all over my body"  . Asthma   . Asymptomatic varicose veins   . Atherosclerosis of native arteries of the extremities, unspecified   . Atrial fibrillation ([place])   . Bronchial pneumonia    history  . 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 Chemotherapy adverse reaction 09/15/11   "makes me light headed and pass out; this is 3rd blood transfusion since going thru  it"  . CHF (congestive heart failure) ([place])   . Chronic airway obstruction, not elsewhere classified   . Chronic fatigue 08/19/2015  . Chronic sinusitis   . Complication of anesthesia    slow to wake up  . COPD with emphysema ([place])   . Coronary artery disease    Dr. Burt Knack had stress test done  . DDD (degenerative disc disease), lumbar   . Depressive disorder, not elsewhere classified   . Diseases of lips   . Disorder of bone and cartilage, unspecified   . DVT (deep venous thrombosis) ([place])   . Fibromyalgia   . Functional urinary incontinence   . GERD (gastroesophageal reflux disease)   . Hard of hearing, right   . Herpes zoster without mention of complication   . Hip pain, chronic 09/17/2014  . History of blood clots    "2 in my aorta"  . History of blood transfusion   . History of bronchitis   . History of echocardiogram    Echo 11/18: mild LVH, EF 60-65, no RWMA, Gr 1 DD, Ao Sclerosis, mild AI, RVH with low nl RSVF, trivial eff  . Hx of radiation therapy 04/28/11 -06/08/11   right upper lobe-lung  . Hyperlipidemia   .  Hypertension   . Kidney infection    "related to chemo"  . Lumbago   . Malignant neoplasm of bronchus and lung, unspecified site   . Mental disorder   . Muscle weakness (generalized)   . Non-small cell lung cancer ([place]) 03/31/2011   Adenocarcinoma Rt upper lobe mass  . Osteoarthrosis, unspecified whether generalized or localized, unspecified site   . Osteoporosis   . Other abnormal blood chemistry   . Other and unspecified hyperlipidemia   . PAD (peripheral artery disease) ([place])   . Pain in thoracic spine   . 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 Primary cancer of right upper lobe of lung ([place]) 02/08/2011   Biopsy Rt upper lobe mass 03/31/11>>adenocarcinoma DIAGNOSIS: Stage IIB/IIIA non-small cell lung cancer consistent with adenocarcinoma diagnosed in December 2012.   PRIOR THERAPY:  1) Status post radiation therapy to the right upper lobe lung mass  completed on 06/04/2011 under the care of Dr. Pablo Ledger.  2) Systemic chemotherapy with carboplatin for AUC of 5 and Alimta 500 mg/M2 giving every 3   . Reflux   . Reflux esophagitis   . Respiratory failure with hypoxia 01/02/2010  . Restless legs syndrome (RLS)   . Scoliosis   . Screening for thyroid disorder   . Shortness of breath    "anytime really"  . Sinus headache    "all the time"  . Spasm of muscle   . Syncope and collapse   . Type II or unspecified type diabetes mellitus with neurological manifestations, uncontrolled(250.62)   . Type II or unspecified type diabetes mellitus without mention of complication, not stated as uncontrolled   . Type II or unspecified type diabetes mellitus without mention of complication, not stated as uncontrolled   . Unspecified constipation   . Unspecified essential hypertension   . Unspecified hearing loss   . Unspecified hereditary and idiopathic peripheral neuropathy   . Unspecified vitamin D deficiency    Past Surgical History:  Procedure Laterality Date  . BRONCHOSCOPY  02/2011  . CARDIAC CATHETERIZATION    . CATARACT EXTRACTION  03/12/2013   Left Eye   . DILATION AND CURETTAGE OF UTERUS  1980's  . EYE SURGERY Left 2014   Cataract  . FETAL BLOOD TRANSFUSION  09/16/11  . JOINT REPLACEMENT  04/13/10   Left Hip  . LUNG LOBECTOMY  1986   left  . SEPTOPLASTY    . TOTAL HIP ARTHROPLASTY  01/2010   left  . TUBAL LIGATION  1980's    reports that she quit smoking about 19 years ago. Her smoking use included cigarettes. She has a 40.00 pack-year smoking history. She has never used smokeless tobacco. She reports that she does not drink alcohol or use drugs. family history includes Arthritis in her daughter; Bone cancer in her father; Cancer in her father; Deep vein thrombosis in her mother; Diabetes in her mother; Heart disease in her mother; Hyperlipidemia in her mother; Hypertension in her mother; Varicose Veins in her mother. Allergies    Allergen Reactions  . Iohexol Shortness Of Breath and Other (See Comments)    "burns me up inside"    Pt does fine with 13 hour prep  01/17/12  . Other     CAN'T STAND THE SMELL OF PURE BLEACH; "HAVE TO BACK UP AND POUR AND DON'T BREATH IT TIL i GET CAP BACK ON; DILUTE IT TO SPRAY"  . Anoro Ellipta [Umeclidinium-Vilanterol] Swelling    Swelling of lips and mouth  . Codeine Itching and  Nausea And Vomiting    REACTION: GI upset  . Protonix [Pantoprazole Sodium] Diarrhea   Current Outpatient Medications on File Prior to Visit  Medication Sig Dispense Refill  . ACCU-CHEK AVIVA PLUS test strip USE AS DIRECTED TO CHECK BLOOD SUGAR THREE TIMES DAILY 100 each 2  . ACCU-CHEK SOFTCLIX LANCETS lancets by Other route. Check blood sugar twice daily. Dx: E11.9, E11.49    . albuterol (PROVENTIL HFA;VENTOLIN HFA) 108 (90 Base) MCG/ACT inhaler Inhale 2 puffs into the lungs every 6 (six) hours as needed for wheezing or shortness of breath. 1 Inhaler 6  . albuterol (PROVENTIL) (2.5 MG/3ML) 0.083% nebulizer solution USE 1 VIAL VIA NEBULIZER EVERY 6 HOURS AS NEEDED FOR WHEEZING OR SHORTNESS OF BREATH 75 mL 3  . aspirin 81 MG tablet Take 81 mg by mouth daily.    Marland Kitchen atorvastatin (LIPITOR) 20 MG tablet Take 1 tablet (20 mg total) by mouth daily. 90 tablet 3  . Blood Glucose Monitoring Suppl (ACCU-CHEK AVIVA PLUS) w/Device KIT Use as directed to check blood sugar Dx: E11.9, E11.49 1 kit 0  . Calcium Carbonate-Vit D-Min (CALTRATE PLUS PO) Take 1 tablet by mouth daily.    Marland Kitchen guaiFENesin (MUCINEX) 600 MG 12 hr tablet Take 600 mg by mouth 2 (two) times daily as needed for cough.    Marland Kitchen HYDROcodone-acetaminophen (NORCO) 10-325 MG tablet Take 1 tablet by mouth 2 (two) times daily as needed. 60 tablet 0  . loratadine (CLARITIN) 10 MG tablet TAKE 1 TABLET BY MOUTH EVERY DAY 30 tablet 5  . metFORMIN (GLUCOPHAGE) 500 MG tablet TAKE 1 TABLET BY MOUTH EVERY MORNING AS NEEDED FOR BLOOD SUGAR OVER 160 AND 1 TABLET BY MOUTH EVERY  EVENING 60 tablet 1  . omeprazole (PRILOSEC) 20 MG capsule Take 1 capsule (20 mg total) by mouth daily. 90 capsule 3  . OXYGEN Inhale 4 L into the lungs.    . potassium chloride (K-DUR,KLOR-CON) 10 MEQ tablet Take 1 tablet (10 mEq total) daily by mouth. 90 tablet 3  . Respiratory Therapy Supplies (FLUTTER) DEVI Use a directed 1 each 0  . senna (SENOKOT) 8.6 MG tablet Take 1 tablet by mouth 2 (two) times daily as needed for constipation.     . traMADol (ULTRAM) 50 MG tablet TAKE 2 TABLETS (100MG TOTAL) BY MOUTH EVERY 6 HOURS AS NEEDED FOR SEVERE PAIN 90 tablet 2  . TRELEGY ELLIPTA 100-62.5-25 MCG/INH AEPB INHALE 1 PUFF BY MOUTH ONCE DAILY 60 each 5  . UNABLE TO FIND Med Name: Cranial Prothesis for Chemotherapy 1 each 0  . furosemide (LASIX) 40 MG tablet Take 1 tablet (40 mg total) by mouth daily for 3 doses. 3 tablet 0  . [DISCONTINUED] calcium carbonate 200 MG capsule Take 250 mg by mouth daily. Does not take everyday     Current Facility-Administered Medications on File Prior to Visit  Medication Dose Route Frequency Provider Last Rate Last Dose  . hyaluronate sodium (RADIAPLEXRX) gel   Topical Once Thea Silversmith, MD      . topical emolient (BIAFINE) emulsion   Topical PRN Thea Silversmith, MD       Review of Systems  Constitutional: Negative for other unusual diaphoresis or sweats HENT: Negative for ear discharge or swelling Eyes: Negative for other worsening visual disturbances Respiratory: Negative for stridor or other swelling  Gastrointestinal: Negative for worsening distension or other blood Genitourinary: Negative for retention or other urinary change Musculoskeletal: Negative for other MSK pain or swelling Skin: Negative for color change or  other new lesions Neurological: Negative for worsening tremors and other numbness  Psychiatric/Behavioral: Negative for worsening agitation or other fatigue All other system neg per pt    Objective:   Physical Exam BP (!) 152/78    Pulse (!) 122   Temp 98.8 F (37.1 C) (Oral)   Ht 5' 6"  (1.676 m)   Wt 165 lb (74.8 kg)   SpO2 95%   BMI 26.63 kg/m  VS noted, thin, on home o2 Constitutional: Pt appears in NAD HENT: Head: NCAT.  Right Ear: External ear normal.  Left Ear: External ear normal.  Eyes: . Pupils are equal, round, and reactive to light. Conjunctivae and EOM are normal Nose: without d/c or deformity Neck: Neck supple. Gross normal ROM Cardiovascular: tachy rate and regular rhythm.   Pulmonary/Chest: Effort normal and breath sounds without rales or wheezing.  Abd:  Soft, NT, ND, + BS, no organomegaly Neurological: Pt is alert. At baseline orientation, motor grossly intact Skin: Skin is warm. No rashes, other new lesions, no LE edema Psychiatric: Pt behavior is normal without agitation, 2+ nervous  No other exam findings  Lab Results  Component Value Date   WBC 13.3 (H) 11/08/2017   HGB 11.9 (L) 11/08/2017   HCT 36.7 11/08/2017   PLT 262.0 11/08/2017   GLUCOSE 118 (H) 11/08/2017   CHOL 207 (H) 07/04/2017   TRIG 275.0 (H) 07/04/2017   HDL 61.10 07/04/2017   LDLDIRECT 131.0 07/04/2017   LDLCALC 96 11/22/2016   ALT 9 11/01/2017   AST 11 (L) 11/01/2017   NA 136 11/08/2017   K 3.3 (L) 11/08/2017   CL 92 (L) 11/08/2017   CREATININE 0.96 11/08/2017   BUN 13 11/08/2017   CO2 35 (H) 11/08/2017   TSH 1.362 09/01/2017   INR 0.94 05/23/2012   HGBA1C 5.9 07/04/2017   MICROALBUR 9.1 (H) 07/04/2017       Assessment & Plan:

## 2017-11-28 NOTE — Assessment & Plan Note (Signed)
Chronic stable, cont same tx

## 2017-11-28 NOTE — Patient Instructions (Addendum)
Your blood pressure did not dropp on standing today, but I still feel you should not be taking 2 fluid pills  Please stop the "triamterene" medication  Please only take the Lasix As Needed for swelling to feet and ankles, or weight increase of 3-5 lbs  Please take the metformin twice per day, as even though you have lost weight, your sugars have been high and taking the metformin is safe; this will help decrease the fluid lost by urinating sugar as well  OK to incresae the xanax to three times per day  Pelase return in 1 week, to check Blood Pressures, Heart rate for need for any further med changes and for blood work that day as well  Please continue all other medications as before, and refills have been done if requested.  Please have the pharmacy call with any other refills you may need.  Please keep your appointments with your specialists as you may have planned  Please return in 1 month, or sooner if needed

## 2017-11-28 NOTE — Assessment & Plan Note (Signed)
Etiology unclear, not orthostatic today, but will d/c triam/hct, cont lasix qd only,  F/u 1 wk  Note:  Total time for pt hx, exam, review of record with pt in the room, determination of diagnoses and plan for further eval and tx is > 40 min, with over 50% spent in coordination and counseling of patient including the differential dx, tx, further evaluation and other management of sinus tachycardia, DM, and anxiety

## 2017-11-28 NOTE — Assessment & Plan Note (Signed)
Uncontrolled, may be possibly related to tachy if has low intravascular volume due to high sugars and poluria, encouraged pt to take metformin as prescribed

## 2017-11-28 NOTE — Assessment & Plan Note (Signed)
[place] for xanax refill,  to f/u any worsening symptoms or concerns

## 2017-11-28 NOTE — Assessment & Plan Note (Signed)
stable overall by history and exam, recent data reviewed with pt, and pt to continue medical treatment as before,  to f/u any worsening symptoms or concerns  

## 2017-11-30 ENCOUNTER — Encounter: Payer: Self-pay | Admitting: Emergency Medicine

## 2017-11-30 ENCOUNTER — Ambulatory Visit (INDEPENDENT_AMBULATORY_CARE_PROVIDER_SITE_OTHER): Payer: Medicare Other | Admitting: Emergency Medicine

## 2017-11-30 DIAGNOSIS — J9611 Chronic respiratory failure with hypoxia: Secondary | ICD-10-CM

## 2017-11-30 DIAGNOSIS — J441 Chronic obstructive pulmonary disease with (acute) exacerbation: Secondary | ICD-10-CM

## 2017-11-30 DIAGNOSIS — C3411 Malignant neoplasm of upper lobe, right bronchus or lung: Secondary | ICD-10-CM

## 2017-11-30 MED ORDER — GUAIFENESIN ER 1200 MG PO TB12: 1.0000 | ORAL_TABLET | Freq: Two times a day (BID) | ORAL | 2 refills | Status: DC

## 2017-11-30 NOTE — Assessment & Plan Note (Signed)
Continue o2 as ordered at all times.

## 2017-11-30 NOTE — Assessment & Plan Note (Signed)
No evidence of acute exacerbation.  She does have daily symptoms, is quite limited.  I explained to her today that it is possible that her tachycardia, compromised cardiac function, edema or at least in part related to her lung disease.  Certainly other contributors may be present as well and she is working on this with Dr. Jenny Reichmann.  With regard to her COPD she is stable on Trelegy.  She has improved secretion management although it still intermittently a problem.  I have asked her to increase her Mucinex to 1200 mg twice a day, continue flutter valve, continue loratadine.

## 2017-11-30 NOTE — Assessment & Plan Note (Signed)
Difficult situation, unclear to me that she will be able to tolerate reinitiation of the nivolumab.  She is following with [name] and there was a new 6 mm right lower lobe nodule noted on her most recent CT scan.  She is currently on observation.  It may not be possible to start her on anything else.  If that is the case then we will need to consider symptom management, possible transition to a palliative mode

## 2017-11-30 NOTE — Progress Notes (Signed)
Subjective:    Patient ID: [name], female    DOB: [birth year], 72 y.o.   MRN: [id number] HPI History of Present Illness:  ROV 09/08/17 --72 year old woman with severe COPD and associated chronic hypoxemic respiratory failure.  She is followed by 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 Her immunotherapy is on hold right now pending some improvement in her COPD, sinus disease.  Her chronic issues include chronic cough, pain and we have refilled her Norco.  She has some chronic right sided airspace disease and a chronic right pleural effusion.  Also he has a lot of clear nasal and sinus drainage.  Her most recent CT scan of the chest was 08/11/2017 and I have reviewed.  This shows little change in her right suprahilar soft tissue density with associated right sided scarring.  Her pleural effusion is for the most part stable, possibly slightly larger. She is having a lot of nasosinus drainage, clear, causing her to get choked. She is on flonase, is unable to tolerate [place]. She wants to go back to ENT. She is also having thrush, involving corners of her mouth.   ROV 11/30/17 --[name] is 69 with a history of severe COPD, stage IIIa adenocarcinoma that principally involves the right upper lobe most recently treated with immunotherapy (currently off), sinus congestion and chronic nasal drainage, chronic hypoxemic respiratory failure.  She has been seen in our office on multiple occasions since I last saw her in May with suspected bronchitis, difficulty managing secretions, acute exacerbations of her COPD.  She was treated with serial antibiotics and finally seemed to benefit the most from a prednisone taper.  She is also being given a flutter valve which she has found to be helpful.  Because she was experiencing some right-sided chest discomfort repeat CT chest was done on 11/10/2017 that I have reviewed.  This showed a stable right suprahilar soft tissue density that  impacts the right upper lobe airway, slightly improved right upper lobe aeration with some mixed groundglass density, multiple stable pulmonary nodules with a new right lower lobe 6 mm nodule of unclear significance.  She has a stable right pleural effusion and scattered emphysematous change.      Objective:   Physical Exam Vitals:   11/30/17 1546  BP: (!) 142/60  Pulse: (!) 124  SpO2: 98%  Weight: 172 lb (78 kg)  Height: 5\' 6"  (1.676 m)   Gen: Pleasant, elderly woman, in no distress,  normal affect  ENT: No lesions,  Some white coating of her tongue, red dried skin around her mouth  Neck: No JVD, no stridor  Lungs: No use of accessory muscles, distant, no wheeze  Cardiovascular: regular, 3/6 systolic M with an intact S2.    Musculoskeletal: No deformities, no cyanosis or clubbing.   Neuro: alert, non focal  Skin: Warm, no lesions or rashes   CBC Latest Ref Rng & Units 11/08/2017 11/01/2017 09/01/2017  WBC 4.0 - 10.5 K/uL 13.3(H) 14.3(H) 8.3  Hemoglobin 12.0 - 15.0 g/dL 11.9(L) 11.8 11.0(L)  Hematocrit 36.0 - 46.0 % 36.7 35.8 33.6(L)  Platelets 150.0 - 400.0 K/uL 262.0 266 286   BMET    Component Value Date/Time   NA 136 11/08/2017 1611   NA 141 04/07/2017 1016   K 3.3 (L) 11/08/2017 1611   K 3.4 (L) 04/07/2017 1016   CL 92 (L) 11/08/2017 1611   CL 99 07/21/2012 1125   CO2 35 (H) 11/08/2017 1611  CO2 27 04/07/2017 1016   GLUCOSE 118 (H) 11/08/2017 1611   GLUCOSE 93 04/07/2017 1016   GLUCOSE 184 (H) 07/21/2012 1125   BUN 13 11/08/2017 1611   BUN 13.8 04/07/2017 1016   CREATININE 0.96 11/08/2017 1611   CREATININE 1.01 (H) 11/01/2017 1309   CREATININE 1.1 04/07/2017 1016   CALCIUM 9.5 11/08/2017 1611   CALCIUM 9.5 04/07/2017 1016   GFRNONAA 54 (L) 11/01/2017 1309   GFRAA >60 11/01/2017 1309   CT scan of the chest 06/22/17  COMPARISON:  03/24/2017  FINDINGS: Cardiovascular: Aortic and branch vessel atherosclerosis. Normal heart size, without pericardial  effusion. Multivessel coronary artery atherosclerosis.  Mediastinum/Nodes: No supraclavicular adenopathy. No well-defined mediastinal adenopathy. Hilar regions poorly evaluated without intravenous contrast.  Lungs/Pleura: Small right-sided pleural effusion is decreased. 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 Right upper lobe endobronchial compression/narrowing is similar. Left upper lobectomy.  Central right upper lobe nodular density measures 1.4 x 1.3 cm on image 62/7 versus similar on the prior exam (when remeasured).  The 8 mm ground-glass nodule and adjacent 2 mm soft tissue nodule in the right lower lobe are felt to be similar, including on 94/7. Minimal nodularity along the right major fissure is felt to be new on image 78/7.  Anteromedial right lower lobe band like consolidation could be treatment related and is similar. Minimal improvement in right upper lobe aeration with ground-glass and airspace disease remaining.  Re-demonstration of collapse/consolidation in the posteromedial right upper lobe.  Posterior left lower lobe atelectasis or scar. more anterior left lower lobe ground-glass opacity is similar, including on image 60/7.  Upper Abdomen: Normal imaged portions of the liver, spleen, stomach, pancreas, gallbladder, left kidney. Bilateral low-density adrenal nodularity is not significantly changed, favoring adenomas.  Musculoskeletal: Mild osteopenia. Posttraumatic or postsurgical defects about the right chest wall/ribs. Status post T4 vertebral augmentation with similar mild to moderate T5 superior endplate compression deformity.  IMPRESSION: 1. Slight improvement in right-sided aeration. Residual right upper lobe nodular density, not significantly changed. Areas of right upper lobe ground-glass and consolidation persist. 2. Aortic atherosclerosis (ICD10-I70.0), coronary artery atherosclerosis and emphysema (ICD10-J43.9). 3. Slight decrease  in right pleural effusion. 4. Similar left lower lobe ground-glass opacity.      Assessment & Plan:  COPD (chronic obstructive pulmonary disease) ([place]) No evidence of acute exacerbation.  She does have daily symptoms, is quite limited.  I explained to her today that it is possible that her tachycardia, compromised cardiac function, edema or at least in part related to her lung disease.  Certainly other contributors may be present as well and she is working on this with Dr. Jenny Reichmann.  With regard to her COPD she is stable on Trelegy.  She has improved secretion management although it still intermittently a problem.  I have asked her to increase her Mucinex to 1200 mg twice a day, continue flutter valve, continue loratadine.  Primary cancer of right upper lobe of lung ([place]) Difficult situation, unclear to me that she will be able to tolerate reinitiation of the nivolumab.  She is following with [name] and there was a new 6 mm right lower lobe nodule noted on her most recent CT scan.  She is currently on observation.  It may not be possible to start her on anything else.  If that is the case then we will need to consider symptom management, possible transition to a palliative mode  Respiratory failure with hypoxia ([place]) Continue o2 as ordered at all times.   Herbie [place]  Lamonte Sakai, MD, PhD 11/30/2017, 4:19 PM [facility] [phone] or if no answer [phone]

## 2017-11-30 NOTE — Patient Instructions (Addendum)
Please continue Trelegy once daily as you have been taking it.  Rinse and gargle after using this medication. Keep albuterol available to use either 1 nebulizer or 2 puffs up to every 4 hours as needed for shortness of breath, chest tightness, wheezing. Continue your oxygen at all times as you have been using it. Please continue loratadine 10 mg (Claritin) once daily. Please increase Mucinex to 1200 mg twice a day. Continue your flutter valve as needed to help clear mucus from your chest. Follow-up with Dr. Jenny Reichmann and with [name] as planned. Follow with Dr Lamonte Sakai in 6 weeks or sooner if you have any problems

## 2017-12-01 ENCOUNTER — Other Ambulatory Visit: Payer: Self-pay | Admitting: *Deleted

## 2017-12-01 MED ORDER — HYDROCODONE-ACETAMINOPHEN 10-325 MG PO TABS: 1.0000 | ORAL_TABLET | Freq: Two times a day (BID) | ORAL | 0 refills | Status: DC | PRN

## 2017-12-05 ENCOUNTER — Encounter: Payer: Self-pay | Admitting: Internal Medicine

## 2017-12-05 ENCOUNTER — Ambulatory Visit (INDEPENDENT_AMBULATORY_CARE_PROVIDER_SITE_OTHER): Payer: Medicare Other | Admitting: Internal Medicine

## 2017-12-05 VITALS — BP 154/68 | HR 97 | Temp 98.1°F | Ht 66.0 in

## 2017-12-05 DIAGNOSIS — I509 Heart failure, unspecified: Secondary | ICD-10-CM

## 2017-12-05 DIAGNOSIS — I10 Essential (primary) hypertension: Secondary | ICD-10-CM | POA: Diagnosis not present

## 2017-12-05 DIAGNOSIS — R000 Tachycardia, unspecified: Secondary | ICD-10-CM | POA: Diagnosis not present

## 2017-12-05 DIAGNOSIS — E1142 Type 2 diabetes mellitus with diabetic polyneuropathy: Secondary | ICD-10-CM

## 2017-12-05 MED ORDER — FUROSEMIDE 40 MG PO TABS: 40.0000 mg | ORAL_TABLET | Freq: Every day | ORAL | 11 refills | Status: DC

## 2017-12-05 NOTE — Assessment & Plan Note (Signed)
With increased volume overload, ok to cont off the triamterene, increase the lasix 40 mg qd, consider BID if wt not reducing with daily wts, minimize oral fluids,  f/u 1 wk

## 2017-12-05 NOTE — Assessment & Plan Note (Signed)
Elevated today, declines other med change as she is wary of even the lasix increased, o/w stable overall by history and exam, recent data reviewed with pt, and pt to continue medical treatment as before,  to f/u any worsening symptoms or concerns BP Readings from Last 3 Encounters:  12/05/17 (!) 154/68  11/30/17 (!) 142/60  11/28/17 (!) 152/78

## 2017-12-05 NOTE — Assessment & Plan Note (Signed)
Improved, I suspect due to dual diuretic use but now volume overloaded

## 2017-12-05 NOTE — Progress Notes (Signed)
Subjective:    Patient ID: [name], female    DOB: [birth year], 72 y.o.   MRN: [id number]  HPI   Here to f/u recent inappropriate tachycardia due to presumed low volume related to dual diuretic use, now reduced to lasix 20 qd prn.  HR now improved and overall felt ok after a few days, but now Wt up 7 lbs, only taking lasix about every other day despite the worsening edema now moderate to leg to knees with tightness, itching and pain.  Not clear if she has significant diuresis with the 20 mg lasix.  Also states she is drinking more fluids b/c "I am so thirsty."  No fever, leg weepiness or cellulitis.  Pt denies chest pain, increased sob or doe, wheezing, orthopnea, PND, palpitations, dizziness or syncope.   Pt denies polydipsia, polyuria  Wt Readings from Last 3 Encounters:  11/30/17 172 lb (78 kg)  11/28/17 165 lb (74.8 kg)  11/16/17 170 lb 6.4 oz (77.3 kg)   Past Medical History:  Diagnosis Date  . Adenocarcinoma, lung ([place])    upper left lobe  . Anemia in neoplastic disease   . Anxiety   . Arthritis    "all over my body"  . Asthma   . Asymptomatic varicose veins   . Atherosclerosis of native arteries of the extremities, unspecified   . Atrial fibrillation ([place])   . Bronchial pneumonia    history  . 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 Chemotherapy adverse reaction 09/15/11   "makes me light headed and pass out; this is 3rd blood transfusion since going thru it"  . CHF (congestive heart failure) ([place])   . Chronic airway obstruction, not elsewhere classified   . Chronic fatigue 08/19/2015  . Chronic sinusitis   . Complication of anesthesia    slow to wake up  . COPD with emphysema ([place])   . Coronary artery disease    Dr. Burt Knack had stress test done  . DDD (degenerative disc disease), lumbar   . Depressive disorder, not elsewhere classified   . Diseases of lips   . Disorder of bone and cartilage, unspecified   . DVT (deep venous thrombosis) ([place])   . Fibromyalgia     . Functional urinary incontinence   . GERD (gastroesophageal reflux disease)   . Hard of hearing, right   . Herpes zoster without mention of complication   . Hip pain, chronic 09/17/2014  . History of blood clots    "2 in my aorta"  . History of blood transfusion   . History of bronchitis   . History of echocardiogram    Echo 11/18: mild LVH, EF 60-65, no RWMA, Gr 1 DD, Ao Sclerosis, mild AI, RVH with low nl RSVF, trivial eff  . Hx of radiation therapy 04/28/11 -06/08/11   right upper lobe-lung  . Hyperlipidemia   . Hypertension   . Kidney infection    "related to chemo"  . Lumbago   . Malignant neoplasm of bronchus and lung, unspecified site   . Mental disorder   . Muscle weakness (generalized)   . Non-small cell lung cancer ([place]) 03/31/2011   Adenocarcinoma Rt upper lobe mass  . Osteoarthrosis, unspecified whether generalized or localized, unspecified site   . Osteoporosis   . Other abnormal blood chemistry   . Other and unspecified hyperlipidemia   . PAD (peripheral artery disease) ([place])   . Pain in thoracic spine   . 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  Primary cancer of right upper lobe of lung ([place]) 02/08/2011   Biopsy Rt upper lobe mass 03/31/11>>adenocarcinoma DIAGNOSIS: Stage IIB/IIIA non-small cell lung cancer consistent with adenocarcinoma diagnosed in December 2012.   PRIOR THERAPY:  1) Status post radiation therapy to the right upper lobe lung mass completed on 06/04/2011 under the care of Dr. Pablo Ledger.  2) Systemic chemotherapy with carboplatin for AUC of 5 and Alimta 500 mg/M2 giving every 3   . Reflux   . Reflux esophagitis   . Respiratory failure with hypoxia 01/02/2010  . Restless legs syndrome (RLS)   . Scoliosis   . Screening for thyroid disorder   . Shortness of breath    "anytime really"  . Sinus headache    "all the time"  . Spasm of muscle   . Syncope and collapse   . Type II or unspecified type diabetes mellitus with neurological manifestations,  uncontrolled(250.62)   . Type II or unspecified type diabetes mellitus without mention of complication, not stated as uncontrolled   . Type II or unspecified type diabetes mellitus without mention of complication, not stated as uncontrolled   . Unspecified constipation   . Unspecified essential hypertension   . Unspecified hearing loss   . Unspecified hereditary and idiopathic peripheral neuropathy   . Unspecified vitamin D deficiency    Past Surgical History:  Procedure Laterality Date  . BRONCHOSCOPY  02/2011  . CARDIAC CATHETERIZATION    . CATARACT EXTRACTION  03/12/2013   Left Eye   . DILATION AND CURETTAGE OF UTERUS  1980's  . EYE SURGERY Left 2014   Cataract  . FETAL BLOOD TRANSFUSION  09/16/11  . JOINT REPLACEMENT  04/13/10   Left Hip  . LUNG LOBECTOMY  1986   left  . SEPTOPLASTY    . TOTAL HIP ARTHROPLASTY  01/2010   left  . TUBAL LIGATION  1980's    reports that she quit smoking about 19 years ago. Her smoking use included cigarettes. She has a 40.00 pack-year smoking history. She has never used smokeless tobacco. She reports that she does not drink alcohol or use drugs. family history includes Arthritis in her daughter; Bone cancer in her father; Cancer in her father; Deep vein thrombosis in her mother; Diabetes in her mother; Heart disease in her mother; Hyperlipidemia in her mother; Hypertension in her mother; Varicose Veins in her mother. Allergies  Allergen Reactions  . Iohexol Shortness Of Breath and Other (See Comments)    "burns me up inside"    Pt does fine with 13 hour prep  01/17/12  . Other     CAN'T STAND THE SMELL OF PURE BLEACH; "HAVE TO BACK UP AND POUR AND DON'T BREATH IT TIL i GET CAP BACK ON; DILUTE IT TO SPRAY"  . Anoro Ellipta [Umeclidinium-Vilanterol] Swelling    Swelling of lips and mouth  . Codeine Itching and Nausea And Vomiting    REACTION: GI upset  . Protonix [Pantoprazole Sodium] Diarrhea   Current Outpatient Medications on File Prior to Visit   Medication Sig Dispense Refill  . ACCU-CHEK AVIVA PLUS test strip USE AS DIRECTED TO CHECK BLOOD SUGAR THREE TIMES DAILY 100 each 2  . ACCU-CHEK SOFTCLIX LANCETS lancets by Other route. Check blood sugar twice daily. Dx: E11.9, E11.49    . albuterol (PROVENTIL HFA;VENTOLIN HFA) 108 (90 Base) MCG/ACT inhaler Inhale 2 puffs into the lungs every 6 (six) hours as needed for wheezing or shortness of breath. 1 Inhaler 6  . albuterol (PROVENTIL) (2.5  MG/3ML) 0.083% nebulizer solution USE 1 VIAL VIA NEBULIZER EVERY 6 HOURS AS NEEDED FOR WHEEZING OR SHORTNESS OF BREATH 75 mL 3  . ALPRAZolam (XANAX) 1 MG tablet TAKE 1 TABLET BY MOUTH 3 TIMES A DAY AS NEEDED FOR ANXIETY 90 tablet 2  . aspirin 81 MG tablet Take 81 mg by mouth daily.    . Blood Glucose Monitoring Suppl (ACCU-CHEK AVIVA PLUS) w/Device KIT Use as directed to check blood sugar Dx: E11.9, E11.49 1 kit 0  . Calcium Carbonate-Vit D-Min (CALTRATE PLUS PO) Take 1 tablet by mouth daily.    Marland Kitchen guaiFENesin (MUCINEX) 600 MG 12 hr tablet Take 600 mg by mouth 2 (two) times daily as needed for cough.    . Guaifenesin 1200 MG TB12 Take 1 tablet (1,200 mg total) by mouth 2 (two) times daily. 60 tablet 2  . HYDROcodone-acetaminophen (NORCO) 10-325 MG tablet Take 1 tablet by mouth 2 (two) times daily as needed. 60 tablet 0  . loratadine (CLARITIN) 10 MG tablet TAKE 1 TABLET BY MOUTH EVERY DAY 30 tablet 5  . metFORMIN (GLUCOPHAGE) 500 MG tablet TAKE 1 TABLET BY MOUTH EVERY MORNING AS NEEDED FOR BLOOD SUGAR OVER 160 AND 1 TABLET BY MOUTH EVERY EVENING 60 tablet 1  . omeprazole (PRILOSEC) 20 MG capsule Take 1 capsule (20 mg total) by mouth daily. 90 capsule 3  . OXYGEN Inhale 4 L into the lungs.    . potassium chloride (K-DUR,KLOR-CON) 10 MEQ tablet Take 1 tablet (10 mEq total) daily by mouth. 90 tablet 3  . Respiratory Therapy Supplies (FLUTTER) DEVI Use a directed 1 each 0  . senna (SENOKOT) 8.6 MG tablet Take 1 tablet by mouth 2 (two) times daily as needed for  constipation.     . traMADol (ULTRAM) 50 MG tablet TAKE 2 TABLETS (100MG TOTAL) BY MOUTH EVERY 6 HOURS AS NEEDED FOR SEVERE PAIN 90 tablet 2  . TRELEGY ELLIPTA 100-62.5-25 MCG/INH AEPB INHALE 1 PUFF BY MOUTH ONCE DAILY 60 each 5  . UNABLE TO FIND Med Name: Cranial Prothesis for Chemotherapy 1 each 0  . [DISCONTINUED] calcium carbonate 200 MG capsule Take 250 mg by mouth daily. Does not take everyday     Current Facility-Administered Medications on File Prior to Visit  Medication Dose Route Frequency Provider Last Rate Last Dose  . hyaluronate sodium (RADIAPLEXRX) gel   Topical Once Thea Silversmith, MD      . topical emolient (BIAFINE) emulsion   Topical PRN Thea Silversmith, MD       Review of Systems  Constitutional: Negative for other unusual diaphoresis or sweats HENT: Negative for ear discharge or swelling Eyes: Negative for other worsening visual disturbances Respiratory: Negative for stridor or other swelling  Gastrointestinal: Negative for worsening distension or other blood Genitourinary: Negative for retention or other urinary change Musculoskeletal: Negative for other MSK pain or swelling Skin: Negative for color change or other new lesions Neurological: Negative for worsening tremors and other numbness  Psychiatric/Behavioral: Negative for worsening agitation or other fatigue All other system neg per pt    Objective:   Physical Exam BP (!) 154/68   Pulse 97   Temp 98.1 F (36.7 C) (Oral)   Ht _0  (1.676 m)   SpO2 93%   BMI 27.76 kg/m  VS noted,  Constitutional: Pt appears in NAD HENT: Head: NCAT.  Right Ear: External ear normal.  Left Ear: External ear normal.  Eyes: . Pupils are equal, round, and reactive to light. Conjunctivae and EOM  are normal Nose: without d/c or deformity Neck: Neck supple. Gross normal ROM Cardiovascular: Normal rate and regular rhythm.   Pulmonary/Chest: Effort normal and breath sounds without rales or wheezing.  Neurological: Pt is  alert. At baseline orientation, motor grossly intact Skin: Skin is warm. No rashes, other new lesions, 2+ bilat LE edema to knees Psychiatric: Pt behavior is normal without agitation  No other exam findings Lab Results  Component Value Date   WBC 13.3 (H) 11/08/2017   HGB 11.9 (L) 11/08/2017   HCT 36.7 11/08/2017   PLT 262.0 11/08/2017   GLUCOSE 118 (H) 11/08/2017   CHOL 207 (H) 07/04/2017   TRIG 275.0 (H) 07/04/2017   HDL 61.10 07/04/2017   LDLDIRECT 131.0 07/04/2017   LDLCALC 96 11/22/2016   ALT 9 11/01/2017   AST 11 (L) 11/01/2017   NA 136 11/08/2017   K 3.3 (L) 11/08/2017   CL 92 (L) 11/08/2017   CREATININE 0.96 11/08/2017   BUN 13 11/08/2017   CO2 35 (H) 11/08/2017   TSH 1.362 09/01/2017   INR 0.94 05/23/2012   HGBA1C 5.9 07/04/2017   MICROALBUR 9.1 (H) 07/04/2017       Assessment & Plan:

## 2017-12-05 NOTE — Assessment & Plan Note (Signed)
stable overall by history and exam, recent data reviewed with pt, and pt to continue medical treatment as before,  to f/u any worsening symptoms or concerns Lab Results  Component Value Date   HGBA1C 5.9 07/04/2017

## 2017-12-05 NOTE — Patient Instructions (Signed)
OK to take the lasix at 40 mg per day (every day)  It is OK to drink fluids but only if you are thirsty  Please continue all other medications as before, and refills have been done if requested.  Please have the pharmacy call with any other refills you may need.  Please keep your appointments with your specialists as you may have planned  Please return in 1 week, or sooner if needed

## 2017-12-06 ENCOUNTER — Telehealth: Payer: Self-pay | Admitting: Emergency Medicine

## 2017-12-06 ENCOUNTER — Ambulatory Visit: Payer: Self-pay | Admitting: *Deleted

## 2017-12-06 NOTE — Telephone Encounter (Signed)
Pt called with several issues: diarrhea,and swelling and pain in her legs and feet.  She stated the pain is a burning type pain in her feet and fingers. She saw her pcp in the office yesterday for the swelling. He recommended that she increase her lasix to 40 mg daily. She has not done this, feels like it is not going to help. Her legs and feet or painful with some redness in spots per pt. Making it difficult to walk.  She had a CT scan done previously and it ruled out blood clots. She wants to be hospitalized so someone can find out what is going with her.  She started with diarrhea last evening. The stool is orange yellow, loose and watery. She has some abd cramping when she needs to go to the bathroom.  She is not eating very much and also forcing herself to drink fluids. She also stated that her heart rate is up and was up yesterday at the office. She is on Oxygen at 4 Liters and her O2 sat now is 96 and HR is 121-125. Advised to calm down and and just keep an eye on her heart rate. To call EMS if it keeps going up and not feeling well.  She does not want to call the EMS because she will have to wait in the ED. Re enforce that the EMS can assess her to determine if she needs to go to the ED. Pt voiced understanding.  Also advised to call for any increase in symptoms. Pt voiced understanding.  [facility] notified regarding pt's issues.  Pt is requesting a call back from her pcp. Reason for Disposition . [1] MODERATE leg swelling (e.g., swelling extends up to knees) AND [2] new onset or worsening . [1] MODERATE diarrhea (e.g., 4-6 times / day more than normal) AND [2] age > 70 years  Answer Assessment - Initial Assessment Questions 1. ONSET: "When did the swelling start?" (e.g., minutes, hours, days)     About a month 2. LOCATION: "What part of the leg is swollen?"  "Are both legs swollen or just one leg?"     Both legs 3. SEVERITY: "How bad is the swelling?" (e.g., localized;  mild, moderate, severe)  - Localized - small area of swelling localized to one leg  - MILD pedal edema - swelling limited to foot and ankle, pitting edema < 1/4 inch (6 mm) deep, rest and elevation eliminate most or all swelling  - MODERATE edema - swelling of lower leg to knee, pitting edema > 1/4 inch (6 mm) deep, rest and elevation only partially reduce swelling  - SEVERE edema - swelling extends above knee, facial or hand swelling present      moderate 4. REDNESS: "Does the swelling look red or infected?"     Redness in different spots 5. PAIN: "Is the swelling painful to touch?" If so, ask: "How painful is it?"   (Scale 1-10; mild, moderate or severe)     Burning pain, pain # 8 or 9 6. FEVER: "Do you have a fever?" If so, ask: "What is it, how was it measured, and when did it start?"      Have cold spells 7. CAUSE: "What do you think is causing the leg swelling?"      Not sure 8. MEDICAL HISTORY: "Do you have a history of heart failure, kidney disease, liver failure, or cancer?"     Cancer, heart disease,  9. RECURRENT SYMPTOM: "Have  you had leg swelling before?" If so, ask: "When was the last time?" "What happened that time?"     no 10. OTHER SYMPTOMS: "Do you have any other symptoms?" (e.g., chest pain, difficulty breathing)       Hx of shortness of breath with copd and has lung cancer  Answer Assessment - Initial Assessment Questions 1. DIARRHEA SEVERITY: "How bad is the diarrhea?" "How many extra stools have you had in the past 24 hours than normal?"    - NO DIARRHEA (SCALE 0)   - MILD (SCALE 1-3): Few loose or mushy BMs; increase of 1-3 stools over normal daily number of stools; mild increase in ostomy output.   -  MODERATE (SCALE 4-7): Increase of 4-6 stools daily over normal; moderate increase in ostomy output. * SEVERE (SCALE 8-10; OR 'WORST POSSIBLE'): Increase of 7 or more stools daily over normal; moderate increase in ostomy output; incontinence.     moderate 2. ONSET:  "When did the diarrhea begin?"      yesterday 3. BM CONSISTENCY: "How loose or watery is the diarrhea?"      Loose and watery 4. VOMITING: "Are you also vomiting?" If so, ask: "How many times in the past 24 hours?"      Feels sick to stomach and no vomitus 5. ABDOMINAL PAIN: "Are you having any abdominal pain?" If yes: "What does it feel like?" (e.g., crampy, dull, intermittent, constant)      A little bit of abd cramping  6. ABDOMINAL PAIN SEVERITY: If present, ask: "How bad is the pain?"  (e.g., Scale 1-10; mild, moderate, or severe)   - MILD (1-3): doesn't interfere with normal activities, abdomen soft and not tender to touch    - MODERATE (4-7): interferes with normal activities or awakens from sleep, tender to touch    - SEVERE (8-10): excruciating pain, doubled over, unable to do any normal activities       Mild to moderate 7. ORAL INTAKE: If vomiting, "Have you been able to drink liquids?" "How much fluids have you had in the past 24 hours?"     No vomiting, half glass of water and drink some gatorade 8. HYDRATION: "Any signs of dehydration?" (e.g., dry mouth [not just dry lips], too weak to stand, dizziness, new weight loss) "When did you last urinate?"     Dry mouth, dry lips, weak when standing and dizzy. Last urination was with bowel movment. 9. EXPOSURE: "Have you traveled to a foreign country recently?" "Have you been exposed to anyone with diarrhea?" "Could you have eaten any food that was spoiled?"     no 10. ANTIBIOTIC USE: "Are you taking antibiotics now or have you taken antibiotics in the past 2 months?"       no 11. OTHER SYMPTOMS: "Do you have any other symptoms?" (e.g., fever, blood in stool)       no  Protocols used: LEG SWELLING AND EDEMA-A-AH, DIARRHEA-A-AH

## 2017-12-06 NOTE — Telephone Encounter (Signed)
I agree with lasix as ordered, though I understand for some reason she is not convinced this will help her swelling  Ok for EMS for any worsening, and monitoring HR

## 2017-12-06 NOTE — Telephone Encounter (Signed)
Called and spoke with patients son, he stated that the patient saw Dr. Kendall Flack and because of what Dr. Judi Cong said when seeing the patient, the son would like [name] to admit the patient to the hospital. Advised patients son that the patient would have to be seen by [place] for [place] to admit her or she would need to go to the ER herself to be evaluated and admitted. Verbalized understanding. Nothing further needed.

## 2017-12-07 NOTE — Telephone Encounter (Signed)
Pt has been informed.

## 2017-12-07 NOTE — Telephone Encounter (Signed)
There are no physicians at [facility] taking new or transfer patients at this time

## 2017-12-07 NOTE — Telephone Encounter (Signed)
Pt has been informed and expressed understanding.   She voiced that she would like to transfer care because she doesn't feel like her declining health is being taken seriously. She would like to transfer to [facility]. I informed her that I would find out if he is accepting new patients and give her a call back.

## 2017-12-09 ENCOUNTER — Inpatient Hospital Stay (HOSPITAL_COMMUNITY)
Admission: EM | Admit: 2017-12-09 | Discharge: 2017-12-15 | DRG: 291 | Disposition: A | Discharge status: Home / Self Care | Payer: Medicare Other | Attending: Internal Medicine | Admitting: Internal Medicine

## 2017-12-09 ENCOUNTER — Encounter (HOSPITAL_COMMUNITY): Payer: Self-pay

## 2017-12-09 ENCOUNTER — Telehealth: Payer: Self-pay | Admitting: Emergency Medicine

## 2017-12-09 ENCOUNTER — Other Ambulatory Visit: Payer: Self-pay

## 2017-12-09 ENCOUNTER — Emergency Department (HOSPITAL_COMMUNITY): Payer: Medicare Other

## 2017-12-09 DIAGNOSIS — R0602 Shortness of breath: Secondary | ICD-10-CM | POA: Diagnosis not present

## 2017-12-09 DIAGNOSIS — R0902 Hypoxemia: Secondary | ICD-10-CM | POA: Diagnosis not present

## 2017-12-09 DIAGNOSIS — T502X5A Adverse effect of carbonic-anhydrase inhibitors, benzothiadiazides and other diuretics, initial encounter: Secondary | ICD-10-CM | POA: Diagnosis not present

## 2017-12-09 DIAGNOSIS — D649 Anemia, unspecified: Secondary | ICD-10-CM | POA: Diagnosis not present

## 2017-12-09 DIAGNOSIS — Z9981 Dependence on supplemental oxygen: Secondary | ICD-10-CM

## 2017-12-09 DIAGNOSIS — F329 Major depressive disorder, single episode, unspecified: Secondary | ICD-10-CM | POA: Diagnosis present

## 2017-12-09 DIAGNOSIS — Z85118 Personal history of other malignant neoplasm of bronchus and lung: Secondary | ICD-10-CM | POA: Diagnosis not present

## 2017-12-09 DIAGNOSIS — E1151 Type 2 diabetes mellitus with diabetic peripheral angiopathy without gangrene: Secondary | ICD-10-CM | POA: Diagnosis present

## 2017-12-09 DIAGNOSIS — M5136 Other intervertebral disc degeneration, lumbar region: Secondary | ICD-10-CM | POA: Diagnosis present

## 2017-12-09 DIAGNOSIS — M79609 Pain in unspecified limb: Secondary | ICD-10-CM | POA: Diagnosis not present

## 2017-12-09 DIAGNOSIS — F32A Depression, unspecified: Secondary | ICD-10-CM | POA: Diagnosis present

## 2017-12-09 DIAGNOSIS — D638 Anemia in other chronic diseases classified elsewhere: Secondary | ICD-10-CM | POA: Diagnosis present

## 2017-12-09 DIAGNOSIS — I70209 Unspecified atherosclerosis of native arteries of extremities, unspecified extremity: Secondary | ICD-10-CM | POA: Diagnosis not present

## 2017-12-09 DIAGNOSIS — E559 Vitamin D deficiency, unspecified: Secondary | ICD-10-CM | POA: Diagnosis present

## 2017-12-09 DIAGNOSIS — K219 Gastro-esophageal reflux disease without esophagitis: Secondary | ICD-10-CM | POA: Diagnosis not present

## 2017-12-09 DIAGNOSIS — J449 Chronic obstructive pulmonary disease, unspecified: Secondary | ICD-10-CM | POA: Diagnosis not present

## 2017-12-09 DIAGNOSIS — H9191 Unspecified hearing loss, right ear: Secondary | ICD-10-CM | POA: Diagnosis present

## 2017-12-09 DIAGNOSIS — M810 Age-related osteoporosis without current pathological fracture: Secondary | ICD-10-CM | POA: Diagnosis present

## 2017-12-09 DIAGNOSIS — R846 Abnormal cytological findings in specimens from respiratory organs and thorax: Secondary | ICD-10-CM | POA: Diagnosis not present

## 2017-12-09 DIAGNOSIS — Z6827 Body mass index (BMI) 27.0-27.9, adult: Secondary | ICD-10-CM

## 2017-12-09 DIAGNOSIS — K210 Gastro-esophageal reflux disease with esophagitis: Secondary | ICD-10-CM | POA: Diagnosis present

## 2017-12-09 DIAGNOSIS — R0789 Other chest pain: Secondary | ICD-10-CM | POA: Diagnosis not present

## 2017-12-09 DIAGNOSIS — Z96642 Presence of left artificial hip joint: Secondary | ICD-10-CM | POA: Diagnosis present

## 2017-12-09 DIAGNOSIS — I10 Essential (primary) hypertension: Secondary | ICD-10-CM | POA: Diagnosis not present

## 2017-12-09 DIAGNOSIS — Z8349 Family history of other endocrine, nutritional and metabolic diseases: Secondary | ICD-10-CM

## 2017-12-09 DIAGNOSIS — Z66 Do not resuscitate: Secondary | ICD-10-CM | POA: Diagnosis present

## 2017-12-09 DIAGNOSIS — C3411 Malignant neoplasm of upper lobe, right bronchus or lung: Secondary | ICD-10-CM | POA: Diagnosis not present

## 2017-12-09 DIAGNOSIS — I2510 Atherosclerotic heart disease of native coronary artery without angina pectoris: Secondary | ICD-10-CM | POA: Diagnosis present

## 2017-12-09 DIAGNOSIS — Z923 Personal history of irradiation: Secondary | ICD-10-CM | POA: Diagnosis not present

## 2017-12-09 DIAGNOSIS — Z809 Family history of malignant neoplasm, unspecified: Secondary | ICD-10-CM | POA: Diagnosis not present

## 2017-12-09 DIAGNOSIS — J310 Chronic rhinitis: Secondary | ICD-10-CM | POA: Diagnosis present

## 2017-12-09 DIAGNOSIS — Z7982 Long term (current) use of aspirin: Secondary | ICD-10-CM

## 2017-12-09 DIAGNOSIS — R600 Localized edema: Secondary | ICD-10-CM | POA: Diagnosis not present

## 2017-12-09 DIAGNOSIS — E1142 Type 2 diabetes mellitus with diabetic polyneuropathy: Secondary | ICD-10-CM | POA: Diagnosis present

## 2017-12-09 DIAGNOSIS — J90 Pleural effusion, not elsewhere classified: Secondary | ICD-10-CM | POA: Diagnosis present

## 2017-12-09 DIAGNOSIS — I5033 Acute on chronic diastolic (congestive) heart failure: Secondary | ICD-10-CM | POA: Diagnosis present

## 2017-12-09 DIAGNOSIS — Z7984 Long term (current) use of oral hypoglycemic drugs: Secondary | ICD-10-CM

## 2017-12-09 DIAGNOSIS — J9621 Acute and chronic respiratory failure with hypoxia: Secondary | ICD-10-CM | POA: Diagnosis not present

## 2017-12-09 DIAGNOSIS — M419 Scoliosis, unspecified: Secondary | ICD-10-CM | POA: Diagnosis present

## 2017-12-09 DIAGNOSIS — F419 Anxiety disorder, unspecified: Secondary | ICD-10-CM | POA: Diagnosis present

## 2017-12-09 DIAGNOSIS — Z885 Allergy status to narcotic agent status: Secondary | ICD-10-CM

## 2017-12-09 DIAGNOSIS — C3490 Malignant neoplasm of unspecified part of unspecified bronchus or lung: Secondary | ICD-10-CM | POA: Diagnosis not present

## 2017-12-09 DIAGNOSIS — Z86718 Personal history of other venous thrombosis and embolism: Secondary | ICD-10-CM

## 2017-12-09 DIAGNOSIS — I4891 Unspecified atrial fibrillation: Secondary | ICD-10-CM | POA: Diagnosis present

## 2017-12-09 DIAGNOSIS — Z833 Family history of diabetes mellitus: Secondary | ICD-10-CM

## 2017-12-09 DIAGNOSIS — J9691 Respiratory failure, unspecified with hypoxia: Secondary | ICD-10-CM | POA: Diagnosis present

## 2017-12-09 DIAGNOSIS — M159 Polyosteoarthritis, unspecified: Secondary | ICD-10-CM | POA: Diagnosis not present

## 2017-12-09 DIAGNOSIS — R000 Tachycardia, unspecified: Secondary | ICD-10-CM | POA: Diagnosis not present

## 2017-12-09 DIAGNOSIS — G2581 Restless legs syndrome: Secondary | ICD-10-CM | POA: Diagnosis present

## 2017-12-09 DIAGNOSIS — G609 Hereditary and idiopathic neuropathy, unspecified: Secondary | ICD-10-CM | POA: Diagnosis present

## 2017-12-09 DIAGNOSIS — Z9842 Cataract extraction status, left eye: Secondary | ICD-10-CM

## 2017-12-09 DIAGNOSIS — Z87891 Personal history of nicotine dependence: Secondary | ICD-10-CM

## 2017-12-09 DIAGNOSIS — R079 Chest pain, unspecified: Secondary | ICD-10-CM | POA: Diagnosis present

## 2017-12-09 DIAGNOSIS — J439 Emphysema, unspecified: Secondary | ICD-10-CM | POA: Diagnosis present

## 2017-12-09 DIAGNOSIS — I110 Hypertensive heart disease with heart failure: Principal | ICD-10-CM | POA: Diagnosis present

## 2017-12-09 DIAGNOSIS — E1149 Type 2 diabetes mellitus with other diabetic neurological complication: Secondary | ICD-10-CM | POA: Diagnosis present

## 2017-12-09 DIAGNOSIS — M797 Fibromyalgia: Secondary | ICD-10-CM | POA: Diagnosis present

## 2017-12-09 DIAGNOSIS — D630 Anemia in neoplastic disease: Secondary | ICD-10-CM | POA: Diagnosis not present

## 2017-12-09 DIAGNOSIS — J9611 Chronic respiratory failure with hypoxia: Secondary | ICD-10-CM

## 2017-12-09 DIAGNOSIS — J441 Chronic obstructive pulmonary disease with (acute) exacerbation: Secondary | ICD-10-CM | POA: Diagnosis not present

## 2017-12-09 DIAGNOSIS — E1165 Type 2 diabetes mellitus with hyperglycemia: Secondary | ICD-10-CM | POA: Diagnosis not present

## 2017-12-09 DIAGNOSIS — T380X5A Adverse effect of glucocorticoids and synthetic analogues, initial encounter: Secondary | ICD-10-CM | POA: Diagnosis not present

## 2017-12-09 DIAGNOSIS — Z8249 Family history of ischemic heart disease and other diseases of the circulatory system: Secondary | ICD-10-CM

## 2017-12-09 DIAGNOSIS — I4711 Inappropriate sinus tachycardia, so stated: Secondary | ICD-10-CM | POA: Diagnosis present

## 2017-12-09 DIAGNOSIS — R0600 Dyspnea, unspecified: Secondary | ICD-10-CM

## 2017-12-09 DIAGNOSIS — E876 Hypokalemia: Secondary | ICD-10-CM | POA: Diagnosis not present

## 2017-12-09 DIAGNOSIS — R627 Adult failure to thrive: Secondary | ICD-10-CM | POA: Diagnosis present

## 2017-12-09 DIAGNOSIS — E785 Hyperlipidemia, unspecified: Secondary | ICD-10-CM | POA: Diagnosis present

## 2017-12-09 DIAGNOSIS — Z888 Allergy status to other drugs, medicaments and biological substances status: Secondary | ICD-10-CM

## 2017-12-09 LAB — CBC
HCT: 32.6 % — ABNORMAL LOW (ref 36.0–46.0)
Hemoglobin: 10 g/dL — ABNORMAL LOW (ref 12.0–15.0)
MCH: 28.4 pg (ref 26.0–34.0)
MCHC: 30.7 g/dL (ref 30.0–36.0)
MCV: 92.6 fL (ref 78.0–100.0)
PLATELETS: 355 10*3/uL (ref 150–400)
RBC: 3.52 MIL/uL — ABNORMAL LOW (ref 3.87–5.11)
RDW: 14.6 % (ref 11.5–15.5)
WBC: 8.2 10*3/uL (ref 4.0–10.5)

## 2017-12-09 LAB — BASIC METABOLIC PANEL
Anion gap: 11 (ref 5–15)
BUN: 7 mg/dL — AB (ref 8–23)
CO2: 28 mmol/L (ref 22–32)
CREATININE: 0.86 mg/dL (ref 0.44–1.00)
Calcium: 8.7 mg/dL — ABNORMAL LOW (ref 8.9–10.3)
Chloride: 100 mmol/L (ref 98–111)
GFR calc Af Amer: >60 mL/min (ref >60)
GFR calc non Af Amer: >60 mL/min (ref >60)
Glucose, Bld: 115 mg/dL — ABNORMAL HIGH (ref 70–99)
Potassium: 3.8 mmol/L (ref 3.5–5.1)
Sodium: 139 mmol/L (ref 135–145)

## 2017-12-09 LAB — I-STAT TROPONIN, ED: Troponin i, poc: 0.01 ng/mL (ref 0.00–0.08)

## 2017-12-09 LAB — TSH: TSH: 1.5 u[IU]/mL (ref 0.350–4.500)

## 2017-12-09 LAB — TROPONIN I: Troponin I: <0.03 ng/mL (ref <0.03)

## 2017-12-09 LAB — BRAIN NATRIURETIC PEPTIDE: B Natriuretic Peptide: 27.3 pg/mL (ref 0.0–100.0)

## 2017-12-09 LAB — T4, FREE: Free T4: 1.2 ng/dL (ref 0.82–1.77)

## 2017-12-09 MED ORDER — SODIUM CHLORIDE 0.9 % IV SOLN
250.0000 mL | INTRAVENOUS | Status: DC | PRN
  Administered 2017-12-13: 250 mL via INTRAVENOUS

## 2017-12-09 MED ORDER — GUAIFENESIN ER 600 MG PO TB12
600.0000 mg | ORAL_TABLET | Freq: Two times a day (BID) | ORAL | Status: DC
  Administered 2017-12-10 – 2017-12-15 (×12): 600 mg via ORAL
  Filled 2017-12-09 (×12): qty 1

## 2017-12-09 MED ORDER — ACETAMINOPHEN 650 MG RE SUPP: 650.0000 mg | Freq: Four times a day (QID) | RECTAL | Status: DC | PRN

## 2017-12-09 MED ORDER — OMEPRAZOLE 20 MG PO CPDR
20.0000 mg | DELAYED_RELEASE_CAPSULE | Freq: Every day | ORAL | Status: DC
  Administered 2017-12-10: 20 mg via ORAL
  Filled 2017-12-09: qty 1

## 2017-12-09 MED ORDER — UMECLIDINIUM BROMIDE 62.5 MCG/INH IN AEPB
1.0000 | INHALATION_SPRAY | Freq: Every day | RESPIRATORY_TRACT | Status: DC
  Administered 2017-12-10: 1 via RESPIRATORY_TRACT
  Filled 2017-12-09: qty 7

## 2017-12-09 MED ORDER — ACETAMINOPHEN 325 MG PO TABS: 650.0000 mg | ORAL_TABLET | Freq: Four times a day (QID) | ORAL | Status: DC | PRN

## 2017-12-09 MED ORDER — ASPIRIN EC 81 MG PO TBEC
81.0000 mg | DELAYED_RELEASE_TABLET | Freq: Every day | ORAL | Status: DC
  Administered 2017-12-10 – 2017-12-15 (×5): 81 mg via ORAL
  Filled 2017-12-09 (×5): qty 1

## 2017-12-09 MED ORDER — FLUTICASONE-UMECLIDIN-VILANT 100-62.5-25 MCG/INH IN AEPB: 1.0000 | INHALATION_SPRAY | Freq: Every day | RESPIRATORY_TRACT | Status: DC

## 2017-12-09 MED ORDER — ONDANSETRON HCL 4 MG PO TABS: 4.0000 mg | ORAL_TABLET | Freq: Four times a day (QID) | ORAL | Status: DC | PRN

## 2017-12-09 MED ORDER — ALPRAZOLAM 0.5 MG PO TABS
1.0000 mg | ORAL_TABLET | Freq: Three times a day (TID) | ORAL | Status: DC | PRN
  Administered 2017-12-10 – 2017-12-15 (×12): 1 mg via ORAL
  Filled 2017-12-09 (×12): qty 2

## 2017-12-09 MED ORDER — LEVALBUTEROL HCL 0.63 MG/3ML IN NEBU
0.6300 mg | INHALATION_SOLUTION | Freq: Four times a day (QID) | RESPIRATORY_TRACT | Status: DC | PRN
  Administered 2017-12-10: 0.63 mg via RESPIRATORY_TRACT
  Filled 2017-12-09: qty 3

## 2017-12-09 MED ORDER — PANTOPRAZOLE SODIUM 40 MG PO TBEC: 40.0000 mg | DELAYED_RELEASE_TABLET | Freq: Every day | ORAL | Status: DC

## 2017-12-09 MED ORDER — FLUTICASONE FUROATE-VILANTEROL 100-25 MCG/INH IN AEPB
1.0000 | INHALATION_SPRAY | Freq: Every day | RESPIRATORY_TRACT | Status: DC
  Administered 2017-12-10: 1 via RESPIRATORY_TRACT
  Filled 2017-12-09: qty 28

## 2017-12-09 MED ORDER — HYDROCODONE-ACETAMINOPHEN 10-325 MG PO TABS
1.0000 | ORAL_TABLET | Freq: Four times a day (QID) | ORAL | Status: DC | PRN
  Administered 2017-12-10 – 2017-12-14 (×7): 1 via ORAL
  Filled 2017-12-09 (×7): qty 1

## 2017-12-09 MED ORDER — SODIUM CHLORIDE 0.9% FLUSH
3.0000 mL | Freq: Two times a day (BID) | INTRAVENOUS | Status: DC
  Administered 2017-12-10 – 2017-12-15 (×12): 3 mL via INTRAVENOUS

## 2017-12-09 MED ORDER — LORATADINE 10 MG PO TABS
10.0000 mg | ORAL_TABLET | Freq: Every day | ORAL | Status: DC
  Administered 2017-12-10 – 2017-12-15 (×6): 10 mg via ORAL
  Filled 2017-12-09 (×6): qty 1

## 2017-12-09 MED ORDER — SALINE SPRAY 0.65 % NA SOLN
1.0000 | NASAL | Status: DC | PRN
  Filled 2017-12-09: qty 44

## 2017-12-09 MED ORDER — TRAMADOL HCL 50 MG PO TABS
100.0000 mg | ORAL_TABLET | Freq: Four times a day (QID) | ORAL | Status: DC | PRN
  Administered 2017-12-10 – 2017-12-14 (×5): 100 mg via ORAL
  Filled 2017-12-09 (×5): qty 2

## 2017-12-09 MED ORDER — FUROSEMIDE 10 MG/ML IJ SOLN: 40.0000 mg | Freq: Once | INTRAMUSCULAR | Status: DC

## 2017-12-09 MED ORDER — SODIUM CHLORIDE 0.9 % IV BOLUS: 1000.0000 mL | Freq: Once | INTRAVENOUS | Status: DC

## 2017-12-09 MED ORDER — INSULIN ASPART 100 UNIT/ML ~~LOC~~ SOLN
0.0000 [IU] | SUBCUTANEOUS | Status: DC
  Administered 2017-12-10: 2 [IU] via SUBCUTANEOUS
  Administered 2017-12-10: 1 [IU] via SUBCUTANEOUS
  Administered 2017-12-10: 3 [IU] via SUBCUTANEOUS
  Administered 2017-12-11 (×6): 2 [IU] via SUBCUTANEOUS
  Administered 2017-12-12: 3 [IU] via SUBCUTANEOUS
  Administered 2017-12-12: 2 [IU] via SUBCUTANEOUS
  Administered 2017-12-12: 1 [IU] via SUBCUTANEOUS
  Administered 2017-12-13 (×2): 3 [IU] via SUBCUTANEOUS
  Administered 2017-12-13: 1 [IU] via SUBCUTANEOUS
  Administered 2017-12-14: 2 [IU] via SUBCUTANEOUS
  Administered 2017-12-14: 1 [IU] via SUBCUTANEOUS
  Administered 2017-12-14: 3 [IU] via SUBCUTANEOUS
  Administered 2017-12-15: 1 [IU] via SUBCUTANEOUS
  Administered 2017-12-15: 2 [IU] via SUBCUTANEOUS

## 2017-12-09 MED ORDER — FUROSEMIDE 10 MG/ML IJ SOLN
40.0000 mg | Freq: Two times a day (BID) | INTRAMUSCULAR | Status: DC
  Administered 2017-12-10 – 2017-12-11 (×4): 40 mg via INTRAVENOUS
  Filled 2017-12-09 (×4): qty 4

## 2017-12-09 MED ORDER — SODIUM CHLORIDE 0.9% FLUSH: 3.0000 mL | INTRAVENOUS | Status: DC | PRN

## 2017-12-09 MED ORDER — ONDANSETRON HCL 4 MG/2ML IJ SOLN: 4.0000 mg | Freq: Four times a day (QID) | INTRAMUSCULAR | Status: DC | PRN

## 2017-12-09 NOTE — H&P (Addendum)
[name] WOE:[id number] DOB: 1945-10-11 DOA: 12/09/2017     PCP: [name], MD   Outpatient Specialists:   CARDS:  Dr. Chryl Heck Cardiology   Pulmonary   [name],   Oncology  [name]. Patient arrived to ER on 12/09/17 at 1243  Patient coming from:   home Lives   With family    Chief Complaint:  Chief Complaint  Patient presents with  . Chest Pain  . Shortness of Breath    HPI: [name] is a 72 y.o. 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 On 9 July she was given a prednisone taper for 5 days then on 11 July patient continued to do poorly and was given Levaquin. Patient was seen in office in 16 July was noted to have thrush and inflammation of her nose she was advised to start using nystatin and stop using Flonase. At that time chest x-ray showing worsening right upper lobe capacity felt to be could be infectious versus underlying carcinoma Initially a course of prednisone Levaquin helped somewhat she continued to have some low-grade temperatures at home up to 99 She notes that point started to be treated with Augmentin and prednisone.  Repeated imaging including chest CT on 25 July and CT on 1 August show clear infiltrate. She started to use flutter valve sputum culture was negative. Patient stabilized until was seen in primary care office on 19 August patient remained persistently tachycardic for 2  months now.  She was reporting feeling lightheaded and weak in general stated her tachycardia was held by benzodiazepine and feeling jittery.  Primary care seen her and thought that maybe she was intravascularly depleted and triamterene/hydrochlorothiazide Patient to only take Lasix as needed and increase dose of metformin Metformin twice a day. Her heart rate has improved but then she started to gain weight she gained 7 pounds.  Patient continue to take Lasix only every other day despite continued to gain fluid.  She started to drink more fluids because she is constantly feeling very thirsty Lasix then was increased to 40 mg a day patient did not increase her Lasix as she did not feel that this is going to help her.  Dopplers done negative for DVT Patient started to have diarrhea and persistent leg swelling started to pain in her both feet and fingers.  She called to PCP office stating she would like to be hospitalized Stools have been yellow-orange and loose associated with abdominal cramping she started to have decreased p.o. intake  She started to be persistently tachycardic back up to 120s 125 Patient was instructed to call EMS if she feels unwell but stated she did not want to do that because she did not want to wait in the emergency department she would like to be directly admitted.   Yesterday patient started to have chest pain feels like pressure but today it somewhat less severe she continued to have increased  shortness of breath and generalized edema EMS administered 324 of aspirin 4 mg of Zofran patient has refused nitroglycerin. She continued to be persistently tachycardic   Regarding pertinent Chronic problems:  Regarding history of lung cancer status post radiation completed in 2013 and systemic chemotherapy of carboplatin and Alimta she was treated in 2013 and again 2016 completed immunotherapy currently on Nivolumab 240 mg IV every 2 weeks.  CT chest done on 25 July showed  improvement right upper lobe decrease in small right pleural effusion   CTA of the chest done on 1 August showed no PE and stable from prior although right-sided pleural effusion was slightly increasing   Echo 11/18: mild LVH, EF 60-65, no RWMA, Gr 1 DD    While in ER:  The following Work up has been ordered so far:  Orders Placed This Encounter  Procedures  . DG Chest 2 View  . Basic metabolic panel  . CBC  . Brain natriuretic peptide  . Cardiac monitoring  . Saline Lock IV, Maintain IV access  . Consult for [facility] Admission  . Pulse oximetry, continuous  . I-stat troponin, ED  . EKG 12-Lead  . ED EKG within 10 minutes      Following Medications were ordered in ER: Medications - No data to display  Significant initial  Findings: Abnormal Labs Reviewed  BASIC METABOLIC PANEL - Abnormal; Notable for the following components:      Result Value   Glucose, Bld 115 (*)    BUN 7 (*)    Calcium 8.7 (*)    All other components within normal limits  CBC - Abnormal; Notable for the following components:   RBC 3.52 (*)    Hemoglobin 10.0 (*)    HCT 32.6 (*)    All other components within normal limits     Na 139 K 3.8  Cr   stable,   Lab Results  Component Value Date   CREATININE 0.86 12/09/2017   CREATININE 0.96 11/08/2017   CREATININE 1.01 (H) 11/01/2017      WBC  8.2  HG/HCT   Stable,     Component Value Date/Time   HGB 10.0 (L) 12/09/2017 1304   HGB 11.8 11/01/2017 1309   HGB 12.7 04/07/2017 1015   HCT 32.6 (L) 12/09/2017 1304   HCT 39.0 04/07/2017 1015       Troponin (Point of Care Test) Recent Labs    12/09/17 1309  TROPIPOC 0.01       BNP (last 3 results) Recent Labs    08/11/17 2131 12/09/17 1304  BNP 33.4 27.3     Lactic Acid, Venous    Component Value Date/Time   LATICACIDVEN 1.14 08/11/2017 2333      UA   not ordered   CXR - Increased RIGHT pleural effusion and RIGHT basilar opacity. Stable appearance of  masslike consolidation in the RIGHT UPPER lobe.     ECG:  Personally reviewed by me showing: HR : 115 Rhythm: sinus tachycardia   no evidence of ischemic changes QTC 433     ED Triage Vitals [12/09/17 1259]  Enc Vitals Group     BP (!) 145/52     Pulse Rate 77     Resp (!) 22     Temp 99.3 F (37.4 C)     Temp Source Axillary     SpO2 100 %     Weight 164 lb (74.4 kg)     Height _0  (1.676 m)  Head Circumference      Peak Flow      Pain Score 2     Pain Loc      Pain Edu?      Excl. in [place]?   BPZW(25)@       Latest  Blood pressure (!) 156/49, pulse (!) 113, temperature 99.3 F (37.4 C), temperature source Axillary, resp. rate (!) 26, height _0  (1.676 m), weight 74.4 kg, SpO2 92 %.       Hospitalist was called for admission for worsening pleural effusion and lower extremity edema in the setting of recent decrease in Lasix suggestive of acute on chronic diastolic CHF exacerbation   Review of Systems:    Pertinent positives include: abdominal pain,  shortness of breath at rest. dyspnea on exertion, Bilateral lower extremity swelling   Constitutional:  No weight loss, night sweats, Fevers, chills, fatigue, weight loss  HEENT:  No headaches, Difficulty swallowing,Tooth/dental problems,Sore throat,  No sneezing, itching, ear ache, nasal congestion, post nasal drip,  Cardio-vascular:  No chest pain, Orthopnea, PND, anasarca, dizziness, palpitations.no GI:  No heartburn, indigestion, nausea, vomiting, diarrhea, change in bowel habits, loss of appetite, melena, blood in stool, hematemesis Resp:   , No excess mucus, no productive cough, No non-productive cough, No coughing up of blood.No change in color of mucus.No wheezing. Skin:  no rash or lesions. No jaundice GU:  no dysuria, change in color of urine, no urgency or frequency. No straining to urinate.  No flank pain.  Musculoskeletal:  No joint pain or no joint swelling. No decreased range of motion. No  back pain.  Psych:  No change in mood or affect. No depression or anxiety. No memory loss.  Neuro: no localizing neurological complaints, no tingling, no weakness, no double vision, no gait abnormality, no slurred speech, no confusion  All systems reviewed and apart from [place] all are negative  Past Medical History:   Past Medical History:  Diagnosis Date  . Adenocarcinoma, lung ([place])    upper left lobe  . Anemia in neoplastic disease   . Anxiety   . Arthritis    "all over my body"  . Asthma   . Asymptomatic varicose veins   . Atherosclerosis of native arteries of the extremities, unspecified   . Atrial fibrillation ([place])   . Bronchial pneumonia    history  . 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 Chemotherapy adverse reaction 09/15/11   "makes me light headed and pass out; this is 3rd blood transfusion since going thru it"  . CHF (congestive heart failure) ([place])   . Chronic airway obstruction, not elsewhere classified   . Chronic fatigue 08/19/2015  . Chronic sinusitis   . Complication of anesthesia    slow to wake up  . COPD with emphysema ([place])   . Coronary artery disease    Dr. Burt Knack had stress test done  . DDD (degenerative disc disease), lumbar   . Depressive disorder, not elsewhere classified   . Diseases of lips   . Disorder of bone and cartilage, unspecified   . DVT (deep venous thrombosis) ([place])   . Fibromyalgia   . Functional urinary incontinence   . GERD (gastroesophageal reflux disease)   . Hard of hearing, right   . Herpes zoster without mention of complication   . Hip pain, chronic 09/17/2014  . History of blood clots    "2 in my aorta"  . History of blood transfusion   . History of  bronchitis   . History of echocardiogram    Echo 11/18: mild LVH, EF 60-65, no RWMA, Gr 1 DD, Ao Sclerosis, mild AI, RVH with low nl RSVF, trivial eff  . Hx of radiation therapy 04/28/11 -06/08/11   right upper lobe-lung  . Hyperlipidemia   . Hypertension   . Kidney  infection    "related to chemo"  . Lumbago   . Malignant neoplasm of bronchus and lung, unspecified site   . Mental disorder   . Muscle weakness (generalized)   . Non-small cell lung cancer ([place]) 03/31/2011   Adenocarcinoma Rt upper lobe mass  . Osteoarthrosis, unspecified whether generalized or localized, unspecified site   . Osteoporosis   . Other abnormal blood chemistry   . Other and unspecified hyperlipidemia   . PAD (peripheral artery disease) ([place])   . Pain in thoracic spine   . 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 Primary cancer of right upper lobe of lung ([place]) 02/08/2011   Biopsy Rt upper lobe mass 03/31/11>>adenocarcinoma DIAGNOSIS: Stage IIB/IIIA non-small cell lung cancer consistent with adenocarcinoma diagnosed in December 2012.   PRIOR THERAPY:  1) Status post radiation therapy to the right upper lobe lung mass completed on 06/04/2011 under the care of Dr. Pablo Ledger.  2) Systemic chemotherapy with carboplatin for AUC of 5 and Alimta 500 mg/M2 giving every 3   . Reflux   . Reflux esophagitis   . Respiratory failure with hypoxia 01/02/2010  . Restless legs syndrome (RLS)   . Scoliosis   . Screening for thyroid disorder   . Shortness of breath    "anytime really"  . Sinus headache    "all the time"  . Spasm of muscle   . Syncope and collapse   . Type II or unspecified type diabetes mellitus with neurological manifestations, uncontrolled(250.62)   . Type II or unspecified type diabetes mellitus without mention of complication, not stated as uncontrolled   . Type II or unspecified type diabetes mellitus without mention of complication, not stated as uncontrolled   . Unspecified constipation   . Unspecified essential hypertension   . Unspecified hearing loss   . Unspecified hereditary and idiopathic peripheral neuropathy   . Unspecified vitamin D deficiency       Past Surgical History:  Procedure Laterality Date  . BRONCHOSCOPY  02/2011  . CARDIAC  CATHETERIZATION    . CATARACT EXTRACTION  03/12/2013   Left Eye   . DILATION AND CURETTAGE OF UTERUS  1980's  . EYE SURGERY Left 2014   Cataract  . FETAL BLOOD TRANSFUSION  09/16/11  . JOINT REPLACEMENT  04/13/10   Left Hip  . LUNG LOBECTOMY  1986   left  . SEPTOPLASTY    . TOTAL HIP ARTHROPLASTY  01/2010   left  . TUBAL LIGATION  1980's    Social History:  Ambulatory   cane,       reports that she quit smoking about 19 years ago. Her smoking use included cigarettes. She has a 40.00 pack-year smoking history. She has never used smokeless tobacco. She reports that she does not drink alcohol or use drugs.     Family History:   Family History  Problem Relation Age of Onset  . Bone cancer Father   . Cancer Father        Bone  . Diabetes Mother   . Deep vein thrombosis Mother   . Heart disease Mother        Heart Disease before age 67-  PVD  . Hyperlipidemia Mother   . Hypertension Mother   . Varicose Veins Mother   . Arthritis Daughter     Allergies: Allergies  Allergen Reactions  . Iohexol Shortness Of Breath and Other (See Comments)    "burns me up inside"    Pt does fine with 13 hour prep  01/17/12  . Other     CAN'T STAND THE SMELL OF PURE BLEACH; "HAVE TO BACK UP AND POUR AND DON'T BREATH IT TIL i GET CAP BACK ON; DILUTE IT TO SPRAY"  . Anoro Ellipta [Umeclidinium-Vilanterol] Swelling    Swelling of lips and mouth  . Codeine Itching and Nausea And Vomiting    REACTION: GI upset  . Protonix [Pantoprazole Sodium] Diarrhea     Prior to Admission medications   Medication Sig Start Date End Date Taking? Authorizing Provider  ACCU-CHEK AVIVA PLUS test strip USE AS DIRECTED TO CHECK BLOOD SUGAR THREE TIMES DAILY 11/14/17   [name], MD  ACCU-CHEK SOFTCLIX LANCETS lancets by Other route. Check blood sugar twice daily. Dx: E11.9, E11.49    [provider]  albuterol (PROVENTIL HFA;VENTOLIN HFA) 108 (90 Base) MCG/ACT inhaler Inhale 2 puffs into the lungs  every 6 (six) hours as needed for wheezing or shortness of breath. 06/01/17   Collene Gobble, MD  albuterol (PROVENTIL) (2.5 MG/3ML) 0.083% nebulizer solution USE 1 VIAL VIA NEBULIZER EVERY 6 HOURS AS NEEDED FOR WHEEZING OR SHORTNESS OF BREATH 08/03/17   Byrum, Rose Fillers, MD  ALPRAZolam Duanne Moron) 1 MG tablet TAKE 1 TABLET BY MOUTH 3 TIMES A DAY AS NEEDED FOR ANXIETY 11/28/17   [name], MD  aspirin 81 MG tablet Take 81 mg by mouth daily.    [provider]  Blood Glucose Monitoring Suppl (ACCU-CHEK AVIVA PLUS) w/Device KIT Use as directed to check blood sugar Dx: E11.9, E11.49 04/21/16   Estill Dooms, MD  Calcium Carbonate-Vit D-Min (CALTRATE PLUS PO) Take 1 tablet by mouth daily.    [provider]  furosemide (LASIX) 40 MG tablet Take 1 tablet (40 mg total) by mouth daily. 12/05/17 12/05/18  [name], MD  guaiFENesin (MUCINEX) 600 MG 12 hr tablet Take 600 mg by mouth 2 (two) times daily as needed for cough.    [provider]  Guaifenesin 1200 MG TB12 Take 1 tablet (1,200 mg total) by mouth 2 (two) times daily. 11/30/17   Collene Gobble, MD  HYDROcodone-acetaminophen (NORCO) 10-325 MG tablet Take 1 tablet by mouth 2 (two) times daily as needed. 12/01/17   Collene Gobble, MD  loratadine (CLARITIN) 10 MG tablet TAKE 1 TABLET BY MOUTH EVERY DAY 04/27/17   Collene Gobble, MD  metFORMIN (GLUCOPHAGE) 500 MG tablet TAKE 1 TABLET BY MOUTH EVERY MORNING AS NEEDED FOR BLOOD SUGAR OVER 160 AND 1 TABLET BY MOUTH EVERY EVENING 06/29/17   [name], MD  omeprazole (PRILOSEC) 20 MG capsule Take 1 capsule (20 mg total) by mouth daily. 07/04/17   [name], MD  OXYGEN Inhale 4 L into the lungs.    [provider]  potassium chloride (K-DUR,KLOR-CON) 10 MEQ tablet Take 1 tablet (10 mEq total) daily by mouth. 02/14/17   [name], MD  Respiratory Therapy Supplies (FLUTTER) DEVI Use a directed 11/08/17   Martyn Ehrich, NP  senna (SENOKOT) 8.6 MG tablet Take 1  tablet by mouth 2 (two) times daily as needed for constipation.     [provider]  traMADol (ULTRAM) 50 MG tablet TAKE 2 TABLETS (100MG TOTAL) BY MOUTH EVERY 6 HOURS AS NEEDED FOR SEVERE PAIN 07/25/17   [name], MD  TRELEGY ELLIPTA 100-62.5-25 MCG/INH AEPB INHALE 1 PUFF BY MOUTH ONCE DAILY 09/26/17   Collene Gobble, MD  UNABLE TO FIND Med Name: Cranial Prothesis for Chemotherapy 12/09/16   Maryanna Shape, NP  calcium carbonate 200 MG capsule Take 250 mg by mouth daily. Does not take everyday  07/04/11  [provider]   Physical Exam: Blood pressure (!) 156/49, pulse (!) 113, temperature 99.3 F (37.4 C), temperature source Axillary, resp. rate (!) 26, height _0  (1.676 m), weight 74.4 kg, SpO2 92 %. 1. General:  in No Acute distress  Chronically ill -appearing 2. Psychological: Alert and   Oriented 3. Head/ENT:   Moist   Mucous Membranes                          Head Non traumatic, neck supple                           Poor Dentition 4. SKIN: normal   Skin turgor,  Skin clean Dry and intact no rash 5. Heart: Regular rate and rhythm no  Murmur, no Rub or gallop 6. Lungs:    no wheezes or diminished on the right  7. Abdomen: Soft, non-tender, Non distended  obese  bowel sounds present 8. Lower extremities: no clubbing, cyanosis, 4+edema and bilateral erythema 9. Neurologically Grossly intact, moving all 4 extremities equally  10. MSK: Normal range of motion   LABS:     Recent Labs  Lab 12/09/17 1304  WBC 8.2  HGB 10.0*  HCT 32.6*  MCV 92.6  PLT 758   Basic Metabolic Panel: Recent Labs  Lab 12/09/17 1304  NA 139  K 3.8  CL 100  CO2 28  GLUCOSE 115*  BUN 7*  CREATININE 0.86  CALCIUM 8.7*      No results for input(s): AST, ALT, ALKPHOS, BILITOT, PROT, ALBUMIN in the last 168 hours. No results for input(s): LIPASE, AMYLASE in the last 168 hours. No results for input(s): AMMONIA in the last 168 hours.    HbA1C: No results for input(s):  HGBA1C in the last 72 hours. CBG: No results for input(s): GLUCAP in the last 168 hours.    Urine analysis:    Component Value Date/Time   COLORURINE YELLOW 11/08/2017 1611   APPEARANCEUR CLEAR 11/08/2017 1611   LABSPEC 1.010 11/08/2017 1611   LABSPEC 1.020 06/18/2011 1547   PHURINE 7.5 11/08/2017 1611   GLUCOSEU NEGATIVE 11/08/2017 1611   HGBUR NEGATIVE 11/08/2017 1611   BILIRUBINUR NEGATIVE 11/08/2017 1611   BILIRUBINUR Neg 12/04/2013 1643   BILIRUBINUR Negative 06/18/2011 1547   KETONESUR NEGATIVE 11/08/2017 1611   PROTEINUR NEGATIVE 08/11/2017 2131   UROBILINOGEN 0.2 11/08/2017 1611   NITRITE NEGATIVE 11/08/2017 1611   LEUKOCYTESUR NEGATIVE 11/08/2017 1611   LEUKOCYTESUR Trace 06/18/2011 1547       Cultures:    Component Value Date/Time   SDES  08/11/2017 2136    BLOOD RIGHT HAND Performed at [facility], [place] [address] [place], [place] [address]    SPECREQUEST  08/11/2017 2136    BOTTLES DRAWN AEROBIC AND ANAEROBIC Blood Culture adequate volume Performed at [facility] [address], [place], [place] [address]    CULT  08/11/2017 2136  NO GROWTH 5 DAYS Performed at [facility], [place] [address], [place], [place] [address]    REPTSTATUS 08/17/2017 FINAL 08/11/2017 2136     Radiological Exams on Admission: Dg Chest 2 View  Result Date: 12/09/2017 CLINICAL DATA:  Patient reports sob, nausea, diarrhea, and left chest pain for 1 month. Swelling, and tingling in hands and feet for 2 months. No blood seen in stool. Hx of adenocarcinoma lung ULL, asthma, atrial fibrillation. EXAM: CHEST - 2 VIEW COMPARISON:  11/10/2017 CT, 11/08/2017 x-ray FINDINGS: The continues to be a masslike consolidation of the RIGHT UPPER lobe. RIGHT pleural effusion has increased. There is RIGHT basilar opacity, partially obscuring the hemidiaphragm. Stable appearance of postoperative changes in the LEFT hilar region. LEFT lung is clear.  IMPRESSION: Increased RIGHT pleural effusion and RIGHT basilar opacity. Stable appearance of masslike consolidation in the RIGHT UPPER lobe. Electronically Signed   By: Nolon Nations M.D.   On: 12/09/2017 13:37    Chart has been reviewed    Assessment/Plan   72 y.o. 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 Acute on chronic diastolic CHF (congestive heart failure) ([place]) -  - admit on telemetry,  cycle cardiac enzymes,    obtain serial ECG  to evaluate for ischemia as a cause of heart failure  monitor daily weight:  Filed Weights   12/09/17 1259  Weight: 74.4 kg   Last BNP BNP (last 3 results) Recent Labs    08/11/17 2131 12/09/17 1304  BNP 33.4 27.3    ProBNP (last 3 results) No results for input(s): PROBNP in the last 8760 hours.   diurese with IV lasix and monitor orthostatics and creatinine to avoid over diuresis.  Order echogram to evaluate EF and valves   cardiology emailed Pleural effusion - likely due to diastolic CHF exacerbation given recently decreased lasix.  We will diuresis and if persists will need thoracentesis discussed with pulmonology  . Anemia - chronic, obtain anemia panel will follow CBC . Anxiety and depression -for now continue as needed Xanax but once respiratory status stabilizes suspect would benefit from more long-term medication such as Klonopin to prevent withdrawals . CAD (coronary artery disease) stable cycle cardiac enzymes continue aspirin  . COPD (chronic obstructive pulmonary disease) (H[facility]) Severe with chronic hypoxia currently on 4 L no wheezing continue home  medications appreciate pulmonology consult given dyspnea and severe end-stage disease With persistent tachycardia to see if medications could be adjusted   DM type 2 with diabetic peripheral neuropathy ([place]) -   Order Sensitive  SSI   -  check TSH and HgA1C  - Hold by mouth medications   . Essential hypertension stable continue home medications . GERD (gastroesophageal reflux disease) stable continue home medications . Inappropriate sinus tachycardia - tsh wnl, WILL CHECK echo, HOLD OFF ON albuterol and chnge to xopenex xanax seems to improve her tachycardia. Could be due toend stage COPD chronic use of SABA  . Primary cancer of right upper lobe of lung [facility]) emailed oncology to let them know patient is being admitted . Respiratory failure with hypoxia (HCC) - currently on 4L around her baseline  Other plan as per orders.  DVT prophylaxis:  SCD  Code Status:    DNR/DNI  as per patient   I had personally discussed CODE STATUS with patient      Family Communication:   Family not  at  Bedside    Disposition Plan:     likely will need placement for rehabilitation                                          Would benefit from PT/OT eval prior to DC  Ordered                                      Consults called: Pulmonology aware  email oncology  Admission status:   inpatient     Level of care     tele          Toy Baker 12/09/2017, 10:32 PM    [facility]  Pager [phone]   after 2 AM please page floor coverage PA If 7AM-7PM, please contact the day team taking care of the patient  [email or web address]  Password [email or web address]

## 2017-12-09 NOTE — ED Triage Notes (Signed)
Pt brought in by [facility] from home for sudden onset left sided chest pain that started yesterday. Pt states feels like pressure today and is less severe. Pt has hx of lung cancer x8 years and has had chronic right sided chest pain. Pt wears 4L O2 chronically. Pt endorses diarrhea and nausea x1week, denies vomiting. Pt also endorses having an intermittent fever for several weeks. Pt states her PCP has been changing her lasix dose, most recently changed from 10mg  to 40mg . Pt c/o generalized edema and an increase in SOB. Per EMS pt lung sounds are clear. Pt given 324mg  aspirin and 4mg  zofran PTA, pt refused nitro.

## 2017-12-09 NOTE — ED Notes (Signed)
Patient transported to X-ray 

## 2017-12-09 NOTE — Telephone Encounter (Signed)
Spoke with Olin Hauser at [employer], requesting update on FL2 form that was mailed to MD [employer]. Pam made aware form was not able to be located. Fax number given to re-send form. Olin Hauser also made aware MD Byrum would not be back in the office until Wednesday of next week. Voiced understanding. Awaiting fax, once received will place in MD Byrum's cubby for completion.

## 2017-12-09 NOTE — ED Provider Notes (Signed)
[facility] Emergency Department Provider Note MRN:  [id number]  Arrival date & time: 12/09/17     Chief Complaint   Chest Pain and Shortness of Breath   History of Present Illness   [name] is a 72 y.o. year-old female with a history of lung cancer, CHF presenting to the ED with chief complaint of chest pain shortness of breath.  2 days of progressively worsening, gradual onset shortness of breath.  Associated with sharp left-sided chest pain, moderate in severity, constant.  No exacerbating or alleviating factors.  Patient also endorsing slowly worsening lower extremity edema, leg pain, leg redness.  Explains that she has been evaluated by her regular doctors for DVT and does not have any DVTs.  Denies fever cough, no abdominal pain.  Review of Systems  A complete 10 system review of systems was obtained and all systems are negative except as noted in the HPI and PMH.   Patient's Health History    Past Medical History:  Diagnosis Date  . Adenocarcinoma, lung ([place])    upper left lobe  . Anemia in neoplastic disease   . Anxiety   . Arthritis    "all over my body"  . Asthma   . Asymptomatic varicose veins   . Atherosclerosis of native arteries of the extremities, unspecified   . Atrial fibrillation ([place])   . Bronchial pneumonia    history  . 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 Chemotherapy adverse reaction 09/15/11   "makes me light headed and pass out; this is 3rd blood transfusion since going thru it"  . CHF (congestive heart failure) ([place])   . Chronic airway obstruction, not elsewhere classified   . Chronic fatigue 08/19/2015  . Chronic sinusitis   . Complication of anesthesia    slow to wake up  . COPD with emphysema ([place])   . Coronary artery disease    Dr. Burt Knack had stress test done  . DDD (degenerative disc disease), lumbar   . Depressive disorder, not elsewhere classified   . Diseases of lips   . Disorder of bone and cartilage,  unspecified   . DVT (deep venous thrombosis) ([place])   . Fibromyalgia   . Functional urinary incontinence   . GERD (gastroesophageal reflux disease)   . Hard of hearing, right   . Herpes zoster without mention of complication   . Hip pain, chronic 09/17/2014  . History of blood clots    "2 in my aorta"  . History of blood transfusion   . History of bronchitis   . History of echocardiogram    Echo 11/18: mild LVH, EF 60-65, no RWMA, Gr 1 DD, Ao Sclerosis, mild AI, RVH with low nl RSVF, trivial eff  . Hx of radiation therapy 04/28/11 -06/08/11   right upper lobe-lung  . Hyperlipidemia   . Hypertension   . Kidney infection    "related to chemo"  . Lumbago   . Malignant neoplasm of bronchus and lung, unspecified site   . Mental disorder   . Muscle weakness (generalized)   . Non-small cell lung cancer ([place]) 03/31/2011   Adenocarcinoma Rt upper lobe mass  . Osteoarthrosis, unspecified whether generalized or localized, unspecified site   . Osteoporosis   . Other abnormal blood chemistry   . Other and unspecified hyperlipidemia   . PAD (peripheral artery disease) ([place])   . Pain in thoracic spine   . 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 Primary cancer of right  upper lobe of lung ([place]) 02/08/2011   Biopsy Rt upper lobe mass 03/31/11>>adenocarcinoma DIAGNOSIS: Stage IIB/IIIA non-small cell lung cancer consistent with adenocarcinoma diagnosed in December 2012.   PRIOR THERAPY:  1) Status post radiation therapy to the right upper lobe lung mass completed on 06/04/2011 under the care of Dr. Pablo Ledger.  2) Systemic chemotherapy with carboplatin for AUC of 5 and Alimta 500 mg/M2 giving every 3   . Reflux   . Reflux esophagitis   . Respiratory failure with hypoxia 01/02/2010  . Restless legs syndrome (RLS)   . Scoliosis   . Screening for thyroid disorder   . Shortness of breath    "anytime really"  . Sinus headache    "all the time"  . Spasm of muscle   . Syncope and collapse   . Type II  or unspecified type diabetes mellitus with neurological manifestations, uncontrolled(250.62)   . Type II or unspecified type diabetes mellitus without mention of complication, not stated as uncontrolled   . Type II or unspecified type diabetes mellitus without mention of complication, not stated as uncontrolled   . Unspecified constipation   . Unspecified essential hypertension   . Unspecified hearing loss   . Unspecified hereditary and idiopathic peripheral neuropathy   . Unspecified vitamin D deficiency     Past Surgical History:  Procedure Laterality Date  . BRONCHOSCOPY  02/2011  . CARDIAC CATHETERIZATION    . CATARACT EXTRACTION  03/12/2013   Left Eye   . DILATION AND CURETTAGE OF UTERUS  1980's  . EYE SURGERY Left 2014   Cataract  . FETAL BLOOD TRANSFUSION  09/16/11  . JOINT REPLACEMENT  04/13/10   Left Hip  . LUNG LOBECTOMY  1986   left  . SEPTOPLASTY    . TOTAL HIP ARTHROPLASTY  01/2010   left  . TUBAL LIGATION  1980's    Family History  Problem Relation Age of Onset  . Bone cancer Father   . Cancer Father        Bone  . Diabetes Mother   . Deep vein thrombosis Mother   . Heart disease Mother        Heart Disease before age 25-  PVD  . Hyperlipidemia Mother   . Hypertension Mother   . Varicose Veins Mother   . Arthritis Daughter     Social History   Socioeconomic History  . Marital status: Divorced    Spouse name: Not on file  . Number of children: 3  . Years of education: Not on file  . Highest education level: Not on file  Occupational History  . Occupation: disabled  . Occupation: RETIRED    Employer: RETIRED  Social Needs  . Financial resource strain: Not on file  . Food insecurity:    Worry: Not on file    Inability: Not on file  . Transportation needs:    Medical: Not on file    Non-medical: Not on file  Tobacco Use  . Smoking status: Former Smoker    Packs/day: 1.00    Years: 40.00    Pack years: 40.00    Types: Cigarettes    Last attempt  to quit: 02/10/1998    Years since quitting: 19.8  . Smokeless tobacco: Never Used  Substance and Sexual Activity  . Alcohol use: No  . Drug use: No  . Sexual activity: Never  Lifestyle  . Physical activity:    Days per week: Not on file    Minutes per session:  Not on file  . Stress: Not on file  Relationships  . Social connections:    Talks on phone: Not on file    Gets together: Not on file    Attends religious service: Not on file    Active member of club or organization: Not on file    Attends meetings of clubs or organizations: Not on file    Relationship status: Not on file  . Intimate partner violence:    Fear of current or ex partner: Not on file    Emotionally abused: Not on file    Physically abused: Not on file    Forced sexual activity: Not on file  Other Topics Concern  . Not on file  Social History Narrative  . Not on file     Physical Exam  Vital Signs and Nursing Notes reviewed Vitals:   12/09/17 1800 12/09/17 1815  BP: (!) 156/49 (!) 151/55  Pulse: (!) 113 (!) 110  Resp: (!) 26 (!) 26  Temp:    SpO2: 92% 92%    CONSTITUTIONAL: Chronically ill-appearing, NAD  NEURO:  Alert and oriented x 3, no focal deficits EYES:  eyes equal and reactive ENT/NECK:  no LAD, no JVD CARDIO: Tachycardic rate, well-perfused, normal S1 and S2 PULM:  CTAB no wheezing or rhonchi GI/GU:  normal bowel sounds, non-distended, non-tender MSK/SPINE:  No gross deformities, diffuse bilateral lower extremity erythema, mild tenderness to palpation, 2+ pitting edema SKIN:  no rash, atraumatic PSYCH:  Appropriate speech and behavior  Diagnostic and Interventional Summary    EKG Interpretation  Date/Time:  Friday December 09 2017 12:57:08 EDT Ventricular Rate:  115 PR Interval:    QRS Duration: 76 QT Interval:  313 QTC Calculation: 433 R Axis:   37 Text Interpretation:  Sinus tachycardia Consider right atrial enlargement Consider right ventricular hypertrophy Baseline wander in  lead(s) V1 Confirmed by Gerlene Fee [phone]) on 12/09/2017 1:09:21 PM Also confirmed by Gerlene Fee [phone]), editor Lynder Parents [phone])  on 12/09/2017 2:39:13 PM      Labs Reviewed  BASIC METABOLIC PANEL - Abnormal; Notable for the following components:      Result Value   Glucose, Bld 115 (*)    BUN 7 (*)    Calcium 8.7 (*)    All other components within normal limits  CBC - Abnormal; Notable for the following components:   RBC 3.52 (*)    Hemoglobin 10.0 (*)    HCT 32.6 (*)    All other components within normal limits  BRAIN NATRIURETIC PEPTIDE  TROPONIN I  TROPONIN I  TROPONIN I  TSH  T4, FREE  T3  I-STAT TROPONIN, ED    DG Chest 2 View  Final Result      Medications - No data to display   Procedures Critical Care  ED Course and Medical Decision Making  I have reviewed the triage vital signs and the nursing notes.  Pertinent labs & imaging results that were available during my care of the patient were reviewed by me and considered in my medical decision making (see below for details). Clinical Course as of Dec 10 2022  Fri Dec 09, 2017  1715 Considering CHF exacerbation versus pneumonia versus worsening tumor burden (lung cancer), less likely PE in this 72 year old female with chest pain shortness of breath.  Recent evaluation with CTA of the chest which was negative for PE.  Bilateral lower extremity edema more suggestive of CHF.  Diffuse bilateral redness most suggestive of lipodermatosclerosis.   [  MB]    Clinical Course User Index [MB] Maudie Flakes, MD    BNP within normal limits.  Chest x-ray reveals worsening pleural effusion, likely causing patient's symptoms.  To be admitted to hospitalist service for further care and evaluation.  Barth Kirks. [place]onia Small, [place] [email or web address]  Final Clinical Impressions(s) / ED Diagnoses     ICD-10-CM   1. Shortness of breath R06.02   2. Chest pain,  unspecified type R07.9   3. Pleural effusion J90     ED Discharge Orders    None         Maudie Flakes, MD 12/09/17 2024

## 2017-12-10 ENCOUNTER — Other Ambulatory Visit: Payer: Self-pay

## 2017-12-10 DIAGNOSIS — J449 Chronic obstructive pulmonary disease, unspecified: Secondary | ICD-10-CM

## 2017-12-10 DIAGNOSIS — C3411 Malignant neoplasm of upper lobe, right bronchus or lung: Secondary | ICD-10-CM

## 2017-12-10 LAB — COMPREHENSIVE METABOLIC PANEL
ALBUMIN: 2.8 g/dL — AB (ref 3.5–5.0)
ALK PHOS: 64 U/L (ref 38–126)
ALT: 11 U/L (ref 0–44)
ANION GAP: 12 (ref 5–15)
AST: 16 U/L (ref 15–41)
BUN: 7 mg/dL — ABNORMAL LOW (ref 8–23)
CALCIUM: 8.4 mg/dL — AB (ref 8.9–10.3)
CO2: 30 mmol/L (ref 22–32)
Chloride: 98 mmol/L (ref 98–111)
Creatinine, Ser: 0.94 mg/dL (ref 0.44–1.00)
GFR calc Af Amer: >60 mL/min (ref >60)
GFR calc non Af Amer: 59 mL/min — ABNORMAL LOW (ref >60)
GLUCOSE: 98 mg/dL (ref 70–99)
Potassium: 3.3 mmol/L — ABNORMAL LOW (ref 3.5–5.1)
SODIUM: 140 mmol/L (ref 135–145)
Total Bilirubin: 1.1 mg/dL (ref 0.3–1.2)
Total Protein: 5.8 g/dL — ABNORMAL LOW (ref 6.5–8.1)

## 2017-12-10 LAB — GLUCOSE, CAPILLARY
GLUCOSE-CAPILLARY: 112 mg/dL — AB (ref 70–99)
GLUCOSE-CAPILLARY: 135 mg/dL — AB (ref 70–99)
GLUCOSE-CAPILLARY: 162 mg/dL — AB (ref 70–99)
Glucose-Capillary: 100 mg/dL — ABNORMAL HIGH (ref 70–99)
Glucose-Capillary: 103 mg/dL — ABNORMAL HIGH (ref 70–99)
Glucose-Capillary: 230 mg/dL — ABNORMAL HIGH (ref 70–99)

## 2017-12-10 LAB — HEMOGLOBIN A1C
Hgb A1c MFr Bld: 5.8 % — ABNORMAL HIGH (ref 4.8–5.6)
Mean Plasma Glucose: 119.76 mg/dL

## 2017-12-10 LAB — CBC
HEMATOCRIT: 32.5 % — AB (ref 36.0–46.0)
HEMOGLOBIN: 9.9 g/dL — AB (ref 12.0–15.0)
MCH: 28 pg (ref 26.0–34.0)
MCHC: 30.5 g/dL (ref 30.0–36.0)
MCV: 92.1 fL (ref 78.0–100.0)
Platelets: 372 10*3/uL (ref 150–400)
RBC: 3.53 MIL/uL — AB (ref 3.87–5.11)
RDW: 14.9 % (ref 11.5–15.5)
WBC: 7.8 10*3/uL (ref 4.0–10.5)

## 2017-12-10 LAB — PHOSPHORUS: Phosphorus: 3.3 mg/dL (ref 2.5–4.6)

## 2017-12-10 LAB — TROPONIN I: Troponin I: <0.03 ng/mL (ref <0.03)

## 2017-12-10 LAB — MAGNESIUM: Magnesium: 1.1 mg/dL — ABNORMAL LOW (ref 1.7–2.4)

## 2017-12-10 MED ORDER — METHYLPREDNISOLONE SODIUM SUCC 125 MG IJ SOLR
60.0000 mg | Freq: Two times a day (BID) | INTRAMUSCULAR | Status: DC
  Administered 2017-12-10 – 2017-12-11 (×2): 60 mg via INTRAVENOUS
  Filled 2017-12-10 (×2): qty 2

## 2017-12-10 MED ORDER — BUDESONIDE 0.25 MG/2ML IN SUSP
0.2500 mg | Freq: Two times a day (BID) | RESPIRATORY_TRACT | Status: DC
  Administered 2017-12-10 – 2017-12-15 (×11): 0.25 mg via RESPIRATORY_TRACT
  Filled 2017-12-10 (×11): qty 2

## 2017-12-10 MED ORDER — METOPROLOL TARTRATE 25 MG PO TABS
25.0000 mg | ORAL_TABLET | Freq: Two times a day (BID) | ORAL | Status: DC
  Administered 2017-12-10 – 2017-12-15 (×11): 25 mg via ORAL
  Filled 2017-12-10 (×11): qty 1

## 2017-12-10 MED ORDER — LEVALBUTEROL HCL 0.63 MG/3ML IN NEBU: 0.6300 mg | INHALATION_SOLUTION | RESPIRATORY_TRACT | Status: DC | PRN

## 2017-12-10 MED ORDER — SODIUM CHLORIDE 0.9 % IV SOLN
1.0000 g | INTRAVENOUS | Status: DC
  Administered 2017-12-10: 1 g via INTRAVENOUS
  Filled 2017-12-10 (×2): qty 10

## 2017-12-10 MED ORDER — IPRATROPIUM BROMIDE 0.02 % IN SOLN
0.5000 mg | Freq: Four times a day (QID) | RESPIRATORY_TRACT | Status: DC
  Administered 2017-12-10 – 2017-12-14 (×18): 0.5 mg via RESPIRATORY_TRACT
  Filled 2017-12-10 (×18): qty 2.5

## 2017-12-10 MED ORDER — LISINOPRIL 5 MG PO TABS
2.5000 mg | ORAL_TABLET | Freq: Every day | ORAL | Status: DC
  Administered 2017-12-10 – 2017-12-15 (×6): 2.5 mg via ORAL
  Filled 2017-12-10 (×6): qty 1

## 2017-12-10 MED ORDER — LEVALBUTEROL HCL 0.63 MG/3ML IN NEBU
0.6300 mg | INHALATION_SOLUTION | Freq: Four times a day (QID) | RESPIRATORY_TRACT | Status: DC
  Administered 2017-12-10 – 2017-12-14 (×18): 0.63 mg via RESPIRATORY_TRACT
  Filled 2017-12-10 (×18): qty 3

## 2017-12-10 MED ORDER — AZITHROMYCIN 500 MG PO TABS
500.0000 mg | ORAL_TABLET | Freq: Every day | ORAL | Status: DC
  Administered 2017-12-10: 500 mg via ORAL
  Filled 2017-12-10: qty 1

## 2017-12-10 MED ORDER — METOPROLOL TARTRATE 5 MG/5ML IV SOLN
5.0000 mg | Freq: Once | INTRAVENOUS | Status: AC
  Administered 2017-12-10: 5 mg via INTRAVENOUS
  Filled 2017-12-10: qty 5

## 2017-12-10 MED ORDER — SODIUM CHLORIDE 0.9 % IV SOLN
500.0000 mg | INTRAVENOUS | Status: DC
  Administered 2017-12-11: 500 mg via INTRAVENOUS
  Filled 2017-12-10 (×2): qty 500

## 2017-12-10 NOTE — Progress Notes (Signed)
Pt's oxygen is maintained at 88-91% at 4l/oxygen via [place], while sleeping sometimes goes down to 85%, MD aware, asked rapid to evaluate her just for a second opinion, K is low, paged MD, pt is getting up and using BSC, will continue to monitor  Palma Holter, RN

## 2017-12-10 NOTE — Progress Notes (Signed)
RT to bedside to administer PRN neb tx. Pt c/o SOB. Upon arrival, pt's [place] was under her chin, SpO2 81%. Pt states she wears 4L O2 at home.  [place] applied correctly, and PRN neb tx given. Pt with clear/diminished bbs, no wheeze noted. RT will continue to monitor.

## 2017-12-10 NOTE — Evaluation (Addendum)
Physical Therapy Evaluation Patient Details Name: [name] MRN: [id number] DOB: 11/20/1945 Today's Date: 12/10/2017   History of Present Illness  Pt is a 72 y.o. female admitted 12/09/17 with SOB, BLE edema and fatigue. Worked up for CHF exacerbation. 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    Clinical Impression  Pt presents with an overall decrease in functional mobility secondary to above. PTA, pt mod indep with SPC and lives with daughter; pt limited to short household distances secondary to [place]. Today, amb short distance with RW at supervision-level. Recommend use of rollator at home for added stability and energy conservation. Pt would benefit from continued acute PT services to maximize functional mobility and independence prior to d/c with HHPT services.     Follow Up Recommendations Home health PT;Supervision for mobility/OOB    Equipment Recommendations  None recommended by PT    Recommendations for Other Services       Precautions / Restrictions Precautions Precautions: Fall Restrictions Weight Bearing Restrictions: No      Mobility  Bed Mobility Overal bed mobility: Needs Assistance Bed Mobility: Supine to Sit     Supine to sit: Supervision;HOB elevated        Transfers Overall transfer level: Needs assistance Equipment used: Rolling walker (2 wheeled) Transfers: Sit to/from Stand Sit to Stand: Supervision;Min guard         General transfer comment: Repeated cues for correct hand placement. Stood from bed and toilet with supervision to min guard for balance  Ambulation/Gait Ambulation/Gait assistance: Supervision Gait Distance (Feet): 20 Feet Assistive device: Rolling walker (2 wheeled) Gait Pattern/deviations: Step-through pattern;Decreased stride length;Trunk flexed Gait velocity: Decreased Gait velocity interpretation: 1.31 - 2.62 ft/sec, indicative of limited community  ambulator General Gait Details: Slow amb with supervision for safety; DOE 3/4  Stairs            Wheelchair Mobility    Modified Rankin (Stroke Patients Only)       Balance Overall balance assessment: Needs assistance   Sitting balance-[name] Scale: Fair       Standing balance-[name] Scale: Fair Standing balance comment: Required assist to don socks               High Level Balance Comments: Can static stand with no UE support             Pertinent Vitals/Pain Pain Assessment: No/denies pain    Home Living Family/patient expects to be discharged to:: Private residence Living Arrangements: Children(daughter works during day) Available Help at Discharge: Family;Available PRN/intermittently Type of Home: House Home Access: Stairs to enter Entrance Stairs-Rails: Right;Left;Can reach both Entrance Stairs-Number of Steps: 10 front, 8 back Home Layout: One level Home Equipment: Cane - single point;Shower seat;Walker - 2 wheels;Walker - 4 wheels;Grab bars - tub/shower Additional Comments: 4L home O2    Prior Function Level of Independence: Independent with assistive device(s)         Comments: Mod indep with SPC. Limited to short household distances due to SOB/fatigue. Daughter drives. Pt reports only leaves house for MD appointments. Sits in recliner majority of day     Hand Dominance   Dominant Hand: Right    Extremity/Trunk Assessment   Upper Extremity Assessment Upper Extremity Assessment: Generalized weakness    Lower Extremity Assessment Lower Extremity Assessment: Generalized weakness       Communication   Communication: HOH  Cognition Arousal/Alertness: Awake/alert Behavior During Therapy: WFL for tasks assessed/performed Overall  Cognitive Status: No family/caregiver present to determine baseline cognitive functioning Area of Impairment: Orientation;Attention;Memory;Problem solving                   Current Attention Level:  Selective Memory: Decreased short-term memory       Problem Solving: Slow processing;Requires verbal cues General Comments: Initially stating [place] was president. Poor memory and attention. Likely baseline cognition      General Comments      Exercises     Assessment/Plan    PT Assessment Patient needs continued PT services  PT Problem List Decreased strength;Decreased activity tolerance;Decreased balance;Decreased mobility;Decreased knowledge of use of DME;Decreased safety awareness;Cardiopulmonary status limiting activity       PT Treatment Interventions DME instruction;Gait training;Stair training;Functional mobility training;Therapeutic activities;Therapeutic exercise;Balance training;Patient/family education    PT Goals (Current goals can be found in the Care Plan section)  Acute Rehab PT Goals Patient Stated Goal: None stated PT Goal Formulation: With patient Time For Goal Achievement: 12/24/17    Frequency Min 3X/week   Barriers to discharge        Co-evaluation               AM-PAC PT "6 Clicks" Daily Activity  Outcome Measure Difficulty turning over in bed (including adjusting bedclothes, sheets and blankets)?: A Little Difficulty moving from lying on back to sitting on the side of the bed? : A Little Difficulty sitting down on and standing up from a chair with arms (e.g., wheelchair, bedside commode, etc,.)?: Unable Help needed moving to and from a bed to chair (including a wheelchair)?: A Little Help needed walking in hospital room?: A Little Help needed climbing 3-5 steps with a railing? : A Little 6 Click Score: 16    End of Session Equipment Utilized During Treatment: Gait belt;Oxygen Activity Tolerance: Patient limited by fatigue Patient left: in chair;with call bell/phone within reach;with chair alarm set Nurse Communication: Mobility status PT Visit Diagnosis: Other abnormalities of gait and mobility (R26.89)    Time: 0955-1010 PT Time  Calculation (min) (ACUTE ONLY): 15 min   Charges:   PT Evaluation $PT Eval Moderate Complexity: [place], PT, DPT Acute Rehabilitation Services  Pager [phone] Office [place] 12/10/2017, 11:42 AM

## 2017-12-10 NOTE — Consult Note (Signed)
[name]  [id number]:[id number] DOB: February 09, 1946 DOA: 12/09/2017 PCP: [name], MD    LOS: 1 day   Reason for Consult / Chief Complaint:  COPD excerbation   Consulting MD and date:  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 Patient was felt to have underlying COPD exacerbation and decompensated diastolic heart failure.  She was started on Lasix 40 mg IV twice daily.  A 2D echo is pending.  She had negative troponins and EKG.Cardiology has been consulted.  She was started on azithromycin and systemic steroids.  PCCM was consulted 12/10/17.  Patient is the office patient of [name].  She is followed by oncology Dr. [name] for stage IIIa adenocarcinoma of the right upper lobe..  She has been on immunotherapy but is currently on hold for the last few months.  Most recent CT chest done on November 10, 2017 showed a stable right suprahilar soft tissue density that impacts the right upper lobe airway.  There was slightly improved right upper lobe aeration with some mixed groundglass density, multiple stable pulmonary nodules and a new right lower lobe nodule measuring 6 mm.  She had a stable to slightly increased right pleural effusion and scattered emphysematous changes.  Chest x-ray on admission showed a increased right pleural effusion and right basilar opacity. C Diff panel is pending .  Pt says she is very weak, has no energy . Wears out with minimal activity .  Has been sick for months and feels she is getting weaker each day.        Subjective:  Pulmonary consult for COPD exacerbation and acute on chronic respiratory failure.  Objective   Blood  pressure (!) 159/51, pulse (!) 121, temperature 98.8 F (37.1 C), temperature source Oral, resp. rate 16, height 5' 6"  (1.676 m), weight 82 kg, SpO2 96 %.    FiO2 (%):  [40 %] 40 %   Intake/Output Summary (Last 24 hours) at 12/10/2017 1218 Last data filed at 12/10/2017 1159 Gross per 24 hour  Intake -  Output 1400 ml  Net -1400 ml   Filed Weights   12/09/17 1259 12/10/17 0400  Weight: 74.4 kg 82 kg    Examination: General: elderly , frail  , nad  HENT:dry mucosa , AT /[place]  Lungs: few trace rhonchi , no wheezing  Cardiovascular:ST , no mr/g  Abdomen: soft , BS+ , No guarding  Extremities: intact, no deformity  Neuro: intact , a/ox 3 , maewx4    Consults: date of consult/date signed off & final recs:  Consult  Date 12/10/17    Procedures:   Significant Diagnostic Tests: 2 D Echo 8/31>>  Micro Data: GI panel 8/30 >>  Antimicrobials:  8./31 Azithromycin oral  >>8/31 8/31 Azithromycin IV >> 8/31 Rocephin >>   Resolved Hospital Problem list    Assessment & Plan:  1. Acute COPD Exacerbation +/- PNA  Plan  Pulmonary hygiene  Change inhalers to Nebs  Pulmonary hygiene, add IS, flutter  Expand ABX coverage for CAP .  Cont IV steroids    2. Acute on chronic hypoxic Resp Failrue  Plan  O2 to keep O2 sats >90%  3. Decompensated  Diastolic CHF  Plan  Cards consult pending  2 Echo pending  Diuresis as b/p and scr will allow   4. Tachycardia ? Etiology   Plan  Echo and cards consult pending    5. Diarrhea ? Etiology  Plan  GI panel pending  D/c PPI .   6. Stage III a LUng Cancer  Plan  Keep OP ONcology follow up   Disposition / Summary of Today's Plan 12/10/17   72 yo female with known stage IIIa lung cancer, immunotherapy on hold with progressive decline admitted with COPD exacerbation , decompensated CHF  .  Best Practice / Goals of Care / Disposition.   DVT prophylaxis: SCD  GI prophylaxis: none  Diet: reg  Mobility: as tolerated  Code Status:  DNR  Family Communication: updated pt and daughter at bedside   Labs   CBC: Recent Labs  Lab 12/09/17 1304 12/10/17 0815  WBC 8.2 7.8  HGB 10.0* 9.9*  HCT 32.6* 32.5*  MCV 92.6 92.1  PLT 355 109   Basic Metabolic Panel: Recent Labs  Lab 12/09/17 1304 12/10/17 0815  NA 139 140  K 3.8 3.3*  CL 100 98  CO2 28 30  GLUCOSE 115* 98  BUN 7* 7*  CREATININE 0.86 0.94  CALCIUM 8.7* 8.4*  MG  --  1.1*  PHOS  --  3.3   GFR: Estimated Creatinine Clearance: 57.6 mL/min (by C-G formula based on SCr of 0.94 mg/dL). Recent Labs  Lab 12/09/17 1304 12/10/17 0815  WBC 8.2 7.8   Liver Function Tests: Recent Labs  Lab 12/10/17 0815  AST 16  ALT 11  ALKPHOS 64  BILITOT 1.1  PROT 5.8*  ALBUMIN 2.8*   No results for input(s): LIPASE, AMYLASE in the last 168 hours. No results for input(s): AMMONIA in the last 168 hours. ABG    Component Value Date/Time   TCO2 31 03/08/2016 1717    Coagulation Profile: No results for input(s): INR, PROTIME in the last 168 hours. Cardiac Enzymes: Recent Labs  Lab 12/09/17 2011 12/10/17 0331 12/10/17 0815  TROPONINI <0.03 <0.03 <0.03   HbA1C: Hgb A1c MFr Bld  Date/Time Value Ref Range Status  12/10/2017 03:31 AM 5.8 (H) 4.8 - 5.6 % Final    Comment:    (NOTE) Pre diabetes:          5.7%-6.4% Diabetes:              >6.4% Glycemic control for   <7.0% adults with diabetes   07/04/2017 04:45 PM 5.9 4.6 - 6.5 % Final    Comment:    Glycemic Control Guidelines for People with Diabetes:Non Diabetic:  <6%Goal of Therapy: <7%Additional Action Suggested:  >8%    CBG: Recent Labs  Lab 12/10/17 0002 12/10/17 0349 12/10/17 0836 12/10/17 1154  GLUCAP 103* 112* 100* 135*     Review of Systems:    Past medical history  She,  has a past medical history of Adenocarcinoma, lung ([place]), Anemia in neoplastic disease, Anxiety, Arthritis, Asthma, Asymptomatic varicose veins, Atherosclerosis of native arteries of the extremities,  unspecified, Atrial fibrillation ([place]), Bronchial pneumonia, 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, Chemotherapy adverse reaction (09/15/11), CHF (congestive heart failure) ([place]), Chronic airway obstruction, not elsewhere classified, Chronic fatigue (08/19/2015), Chronic sinusitis, Complication of anesthesia, COPD with emphysema ([place]), Coronary artery disease, DDD (degenerative disc disease), lumbar, Depressive disorder, not elsewhere classified, Diseases of lips, Disorder of bone and cartilage, unspecified, DVT (deep venous thrombosis) ([place]), Fibromyalgia, Functional urinary incontinence, GERD (  gastroesophageal reflux disease), Hard of hearing, right, Herpes zoster without mention of complication, Hip pain, chronic (09/17/2014), History of blood clots, History of blood transfusion, History of bronchitis, History of echocardiogram, radiation therapy (04/28/11 -06/08/11), Hyperlipidemia, Hypertension, Kidney infection, Lumbago, Malignant neoplasm of bronchus and lung, unspecified site, Mental disorder, Muscle weakness (generalized), Non-small cell lung cancer ([place]) (03/31/2011), Osteoarthrosis, unspecified whether generalized or localized, unspecified site, Osteoporosis, Other abnormal blood chemistry, Other and unspecified hyperlipidemia, PAD (peripheral artery disease) ([place]), Pain in thoracic spine, Polyneuropathy in diabetes(357.2), Primary cancer of right upper lobe of lung ([place]) (02/08/2011), Reflux, Reflux esophagitis, Respiratory failure with hypoxia (01/02/2010), Restless legs syndrome (RLS), Scoliosis, Screening for thyroid disorder, Shortness of breath, Sinus headache, Spasm of muscle, Syncope and collapse, Type II or unspecified type diabetes mellitus with neurological manifestations, uncontrolled(250.62), Type II or unspecified type diabetes mellitus without mention of complication, not stated as uncontrolled, Type II or unspecified type diabetes mellitus without mention of complication, not stated as  uncontrolled, Unspecified constipation, Unspecified essential hypertension, Unspecified hearing loss, Unspecified hereditary and idiopathic peripheral neuropathy, and Unspecified vitamin D deficiency.   Surgical History    Past Surgical History:  Procedure Laterality Date  . BRONCHOSCOPY  02/2011  . CARDIAC CATHETERIZATION    . CATARACT EXTRACTION  03/12/2013   Left Eye   . DILATION AND CURETTAGE OF UTERUS  1980's  . EYE SURGERY Left 2014   Cataract  . FETAL BLOOD TRANSFUSION  09/16/11  . JOINT REPLACEMENT  04/13/10   Left Hip  . LUNG LOBECTOMY  1986   left  . SEPTOPLASTY    . TOTAL HIP ARTHROPLASTY  01/2010   left  . TUBAL LIGATION  1980's     Social History   Social History   Socioeconomic History  . Marital status: Divorced    Spouse name: Not on file  . Number of children: 3  . Years of education: Not on file  . Highest education level: Not on file  Occupational History  . Occupation: disabled  . Occupation: RETIRED    Employer: RETIRED  Social Needs  . Financial resource strain: Not on file  . Food insecurity:    Worry: Not on file    Inability: Not on file  . Transportation needs:    Medical: Not on file    Non-medical: Not on file  Tobacco Use  . Smoking status: Former Smoker    Packs/day: 1.00    Years: 40.00    Pack years: 40.00    Types: Cigarettes    Last attempt to quit: 02/10/1998    Years since quitting: 19.8  . Smokeless tobacco: Never Used  Substance and Sexual Activity  . Alcohol use: No  . Drug use: No  . Sexual activity: Never  Lifestyle  . Physical activity:    Days per week: Not on file    Minutes per session: Not on file  . Stress: Not on file  Relationships  . Social connections:    Talks on phone: Not on file    Gets together: Not on file    Attends religious service: Not on file    Active member of club or organization: Not on file    Attends meetings of clubs or organizations: Not on file    Relationship status: Not on file    . Intimate partner violence:    Fear of current or ex partner: Not on file    Emotionally abused: Not on file    Physically abused: Not on file  Forced sexual activity: Not on file  Other Topics Concern  . Not on file  Social History Narrative  . Not on file  ,  reports that she quit smoking about 19 years ago. Her smoking use included cigarettes. She has a 40.00 pack-year smoking history. She has never used smokeless tobacco. She reports that she does not drink alcohol or use drugs.   Family history   Her family history includes Arthritis in her daughter; Bone cancer in her father; Cancer in her father; Deep vein thrombosis in her mother; Diabetes in her mother; Heart disease in her mother; Hyperlipidemia in her mother; Hypertension in her mother; Varicose Veins in her mother.   Allergies Allergies  Allergen Reactions  . Iohexol Shortness Of Breath and Other (See Comments)    "burns me up inside"    Pt does fine with 13 hour prep  01/17/12  . Other     CAN'T STAND THE SMELL OF PURE BLEACH; "HAVE TO BACK UP AND POUR AND DON'T BREATH IT TIL i GET CAP BACK ON; DILUTE IT TO SPRAY"  . Anoro Ellipta [Umeclidinium-Vilanterol] Swelling    Swelling of lips and mouth  . Codeine Itching and Nausea And Vomiting    REACTION: GI upset  . Protonix [Pantoprazole Sodium] Diarrhea    Home meds  Prior to Admission medications   Medication Sig Start Date End Date Taking? Authorizing Provider  albuterol (PROVENTIL HFA;VENTOLIN HFA) 108 (90 Base) MCG/ACT inhaler Inhale 2 puffs into the lungs every 6 (six) hours as needed for wheezing or shortness of breath. 06/01/17  Yes Collene Gobble, MD  albuterol (PROVENTIL) (2.5 MG/3ML) 0.083% nebulizer solution USE 1 VIAL VIA NEBULIZER EVERY 6 HOURS AS NEEDED FOR WHEEZING OR SHORTNESS OF BREATH Patient taking differently: Take 2.5 mg by nebulization every 6 (six) hours as needed for wheezing or shortness of breath.  08/03/17  Yes Byrum, Rose Fillers, MD  ALPRAZolam  Duanne Moron) 1 MG tablet TAKE 1 TABLET BY MOUTH 3 TIMES A DAY AS NEEDED FOR ANXIETY Patient taking differently: Take 1 mg by mouth 3 (three) times daily as needed for anxiety.  11/28/17  Yes [name], MD  aspirin 81 MG tablet Take 81 mg by mouth daily.   Yes [provider]  Calcium Carbonate-Vit D-Min (CALTRATE PLUS PO) Take 1 tablet by mouth daily.   Yes [provider]  Guaifenesin 1200 MG TB12 Take 1 tablet (1,200 mg total) by mouth 2 (two) times daily. 11/30/17  Yes Collene Gobble, MD  HYDROcodone-acetaminophen (NORCO) 10-325 MG tablet Take 1 tablet by mouth 2 (two) times daily as needed. 12/01/17  Yes Collene Gobble, MD  loratadine (CLARITIN) 10 MG tablet TAKE 1 TABLET BY MOUTH EVERY DAY Patient taking differently: Take 10 mg by mouth daily.  04/27/17  Yes Collene Gobble, MD  metFORMIN (GLUCOPHAGE) 500 MG tablet TAKE 1 TABLET BY MOUTH EVERY MORNING AS NEEDED FOR BLOOD SUGAR OVER 160 AND 1 TABLET BY MOUTH EVERY EVENING Patient taking differently: Take 500 mg by mouth 2 (two) times daily.  06/29/17  Yes [name], MD  omeprazole (PRILOSEC) 20 MG capsule Take 1 capsule (20 mg total) by mouth daily. 07/04/17  Yes [name], MD  OXYGEN Inhale 4 L into the lungs.   Yes [provider]  potassium chloride (K-DUR,KLOR-CON) 10 MEQ tablet Take 1 tablet (10 mEq total) daily by mouth. 02/14/17  Yes [name], MD  senna (SENOKOT) 8.6 MG tablet Take 1  tablet by mouth 2 (two) times daily as needed for constipation.    Yes [provider]  traMADol (ULTRAM) 50 MG tablet TAKE 2 TABLETS (100MG TOTAL) BY MOUTH EVERY 6 HOURS AS NEEDED FOR SEVERE PAIN Patient taking differently: Take 100 mg by mouth every 6 (six) hours as needed for severe pain.  07/25/17  Yes [name], MD  TRELEGY ELLIPTA 100-62.5-25 MCG/INH AEPB INHALE 1 PUFF BY MOUTH ONCE DAILY Patient taking differently: Inhale 1 puff into the lungs daily.  09/26/17  Yes Collene Gobble, MD  ACCU-CHEK AVIVA PLUS  test strip USE AS DIRECTED TO CHECK BLOOD SUGAR THREE TIMES DAILY 11/14/17   [name], MD  Blood Glucose Monitoring Suppl (ACCU-CHEK AVIVA PLUS) w/Device KIT Use as directed to check blood sugar Dx: E11.9, E11.49 04/21/16   Estill Dooms, MD  furosemide (LASIX) 40 MG tablet Take 1 tablet (40 mg total) by mouth daily. Patient not taking: Reported on 12/09/2017 12/05/17 12/05/18  [name], MD  Respiratory Therapy Supplies (FLUTTER) DEVI Use a directed 11/08/17   Martyn Ehrich, NP  UNABLE TO FIND Med Name: Cranial Prothesis for Chemotherapy Patient not taking: Reported on 12/09/2017 12/09/16   Maryanna Shape, NP  calcium carbonate 200 MG capsule Take 250 mg by mouth daily. Does not take everyday  07/04/11  [provider]

## 2017-12-10 NOTE — Evaluation (Signed)
Occupational Therapy Evaluation Patient Details Name: [name] MRN: [id number] DOB: 1946-01-04 Today's Date: 12/10/2017    History of Present Illness Pt is a 72 y.o. female admitted 12/09/17 with SOB, BLE edema and fatigue. Worked up for CHF exacerbation. 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   Clinical Impression   PTA Pt mod I in home environment with ADL, daughter performs IADL (cooking/laundry/cleaning). Currently Pt is mod A for LB dressing (can manage underwear min guard but requires assist to don socks), min guard with decreased activity tolerance for sink level grooming, and min guard for toilet transfers. Cues for safety throughout. Pt will benefit from skilled OT in the acute setting with focus on energy conservation and potentially AE education.     Follow Up Recommendations  No OT follow up;Supervision - Intermittent    Equipment Recommendations  None recommended by OT(Pt has appropriate DME)    Recommendations for Other Services       Precautions / Restrictions Precautions Precautions: Fall Restrictions Weight Bearing Restrictions: No      Mobility Bed Mobility Overal bed mobility: Needs Assistance Bed Mobility: Supine to Sit     Supine to sit: Supervision;HOB elevated        Transfers Overall transfer level: Needs assistance Equipment used: Rolling walker (2 wheeled) Transfers: Sit to/from Stand Sit to Stand: Supervision;Min guard         General transfer comment: Repeated cues for correct hand placement. Stood from bed and toilet with supervision to min guard for balance    Balance Overall balance assessment: Needs assistance   Sitting balance-[name] Scale: Fair       Standing balance-[name] Scale: Fair Standing balance comment: Required assist to don socks               High Level Balance Comments: Can static stand with no UE support           ADL either  performed or assessed with clinical judgement   ADL Overall ADL's : Needs assistance/impaired Eating/Feeding: Modified independent;Sitting   Grooming: Wash/dry face;Wash/dry hands;Min guard;Standing Grooming Details (indicate cue type and reason): unable to stand at sink for more than 2 activities Upper Body Bathing: Set up;Sitting   Lower Body Bathing: Moderate assistance;Sitting/lateral leans   Upper Body Dressing : Set up   Lower Body Dressing: Moderate assistance Lower Body Dressing Details (indicate cue type and reason): able to manage underwear, but unable to don socks without assist Toilet Transfer: Min guard;Ambulation;RW;Comfort height toilet;Grab bars Toilet Transfer Details (indicate cue type and reason): pt uses a toilet riser at home Toileting- Clothing Manipulation and Hygiene: Min guard;Sit to/from stand Toileting - Clothing Manipulation Details (indicate cue type and reason): able to manage peri care and underwear     Functional mobility during ADLs: Min guard;Rolling walker;Cueing for sequencing General ADL Comments: impacted by DOE, SOB, and decreased activity tolerance     Vision Patient Visual Report: No change from baseline Vision Assessment?: No apparent visual deficits     Perception     Praxis      Pertinent Vitals/Pain Pain Assessment: No/denies pain     Hand Dominance Right   Extremity/Trunk Assessment Upper Extremity Assessment Upper Extremity Assessment: Generalized weakness   Lower Extremity Assessment Lower Extremity Assessment: Generalized weakness   Cervical / Trunk Assessment Cervical / Trunk Assessment: Kyphotic   Communication Communication Communication: HOH   Cognition Arousal/Alertness: Awake/alert Behavior During Therapy: WFL for tasks assessed/performed  Overall Cognitive Status: No family/caregiver present to determine baseline cognitive functioning Area of Impairment: Orientation;Attention;Memory;Problem solving                  Orientation Level: (thought that Clinton was president) Current Attention Level: Selective Memory: Decreased short-term memory       Problem Solving: Slow processing;Requires verbal cues General Comments: Initially stating Clinton was president. Poor memory and attention. Likely baseline cognition   General Comments       Exercises     Shoulder Instructions      Home Living Family/patient expects to be discharged to:: Private residence Living Arrangements: Children(daughter works during the day) Available Help at Discharge: Family;Available PRN/intermittently Type of Home: House Home Access: Stairs to enter [employer] of Steps: 10 front, 8 back Entrance Stairs-Rails: Right;Left;Can reach both Home Layout: One level     Bathroom Shower/Tub: Tub/shower unit;Curtain   Biochemist, clinical: Standard     Home Equipment: Cane - single point;Shower seat;Walker - 2 wheels;Walker - 4 wheels;Grab bars - tub/shower   Additional Comments: 4L home O2      Prior Functioning/Environment Level of Independence: Independent with assistive device(s)        Comments: Mod indep with SPC. Limited to short household distances due to SOB/fatigue. Daughter drives. Pt reports only leaves house for MD appointments. Sits in recliner majority of day        OT Problem List: Decreased activity tolerance;Impaired balance (sitting and/or standing);Decreased safety awareness;Decreased knowledge of use of DME or AE;Cardiopulmonary status limiting activity      OT Treatment/Interventions: Self-care/ADL training;Therapeutic exercise;Energy conservation;DME and/or AE instruction;Therapeutic activities;Cognitive remediation/compensation;Patient/family education;Balance training    OT Goals(Current goals can be found in the care plan section) Acute Rehab OT Goals Patient Stated Goal: None stated ADL Goals Pt Will Transfer to Toilet: with modified independence;ambulating Pt  Will Perform Toileting - Clothing Manipulation and hygiene: sit to/from stand;with modified independence Pt Will Perform Tub/Shower Transfer: Tub transfer;ambulating;shower seat Additional ADL Goal #1: Pt will recall 3 ways of conserving energy during ADL/IADL at indpendent level  OT Frequency: Min 2X/week   Barriers to D/C:            Co-evaluation              AM-PAC PT "6 Clicks" Daily Activity     Outcome Measure Help from another person eating meals?: None Help from another person taking care of personal grooming?: A Little Help from another person toileting, which includes using toliet, bedpan, or urinal?: A Little Help from another person bathing (including washing, rinsing, drying)?: A Little Help from another person to put on and taking off regular upper body clothing?: A Little Help from another person to put on and taking off regular lower body clothing?: A Lot 6 Click Score: 18   End of Session Equipment Utilized During Treatment: Gait belt;Rolling walker;Oxygen(6L throughout mobility) Nurse Communication: Mobility status  Activity Tolerance: Patient tolerated treatment well Patient left: in chair;with call bell/phone within reach;with chair alarm set;with SCD's reapplied  OT Visit Diagnosis: Unsteadiness on feet (R26.81);Other abnormalities of gait and mobility (R26.89);Muscle weakness (generalized) (M62.81);Other symptoms and signs involving cognitive function                Time: 1010-1029 OT Time Calculation (min): 19 min Charges:  OT General Charges $OT Visit: 1 Visit OT Evaluation $OT Eval Moderate Complexity: [place] OTR/L Acute Rehabilitation Services Pager: [phone] Office: [place] 12/10/2017, 12:14 PM

## 2017-12-10 NOTE — Progress Notes (Signed)
Pt is alert and oriented. Her heart rate has been in the 120's the last hour, and increases to 130's, 140's when she gets up to use the bathroom. Provider on-call has been notified via [place], waiting for response. Will continue to monitor the patient.

## 2017-12-10 NOTE — Progress Notes (Signed)
PROGRESS NOTE    [name]  JSE:[id number] DOB: [birth year] DOA: 12/09/2017 PCP: [name], MD     Brief Narrative:  72 year old woman admitted from home on 8/30 due to shortness of breath and lower extremity edema.  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  She has had worsening fatigue and shortness of breath, is having extreme difficulty performing activities of daily living.  In July was treated for URI/bronchitis/pneumonia.  On day of admission she started complaining of increased shortness of breath and worsening lower extremity edema and was sent to the hospital for evaluation.   Assessment & Plan:   Active Problems:   Essential hypertension   COPD (chronic obstructive pulmonary disease) (HCC)   Respiratory failure with hypoxia (HCC)   Primary cancer of right upper lobe of lung (HCC)   Anemia   Restless legs syndrome (RLS)   GERD (gastroesophageal reflux disease)   DM type 2 with diabetic peripheral neuropathy (HCC)   Anxiety and depression   CAD (coronary artery disease)   Inappropriate sinus tachycardia   Acute on chronic diastolic CHF (congestive heart failure) (HCC)   Pleural effusion   Chest pain   Acute on chronic hypoxemic respiratory failure -Presumed due to a combination of acute diastolic heart failure and COPD with acute exacerbation, please see below for details.    Acute on chronic diastolic heart failure -Echocardiogram from November 2018 shows normal ejection fraction of 60 to 65% with grade 1 diastolic dysfunction. -She was started on Lasix 40 mg IV twice daily and has diuresed 1 L overnight.  Continue to strive for negative fluid balance. -Still appears significantly volume overloaded on exam. -Repeat 2D echo is ordered and pending. -Has ruled out for ACS with negative  troponins and EKG without acute ischemic changes. -No plans for cardiology consultation at present -Is on beta-blocker, aspirin.  With normal renal function will add low-dose ACE inhibitor.  COPD with acute exacerbation -With URI symptoms (cough, runny/stuffy nose). -She is maintaining saturations in the mid 90s on her chronic 4 L of oxygen. -With significantly decreased air movement on chest auscultation, will start systemic steroids, will add azithromycin and continue as needed Xopenex.  Benign essential hypertension -Fair control with systolics in the 073X. -Given history of diastolic heart failure, will start low-dose beta-blockade and ACE inhibitor. -Continue to follow and monitor renal function.  GERD -Continue PPI  Type 2 diabetes with diabetic peripheral neuropathy -Well-controlled, continue current regimen.  Adenocarcinoma of the lung -Patient follow-up with oncology.    Sinus tachycardia -Suspect due to acute respiratory failure and URI, -Follow stop   DVT prophylaxis: SCDs Code Status: DNR Family Communication: Patient only Disposition Plan: PT evaluation once less acute to determine posthospitalization care  Consultants:   None  Procedures:   2D echo, pending  Antimicrobials:  Anti-infectives (From admission, onward)   None       Subjective: Sitting in chair at bedside, complains of significant shortness of breath and weakness, no diarrhea since admission  Objective: Vitals:   12/10/17 0036 12/10/17 0400 12/10/17 0738 12/10/17 0741  BP: (!) 152/55 (!) 148/61    Pulse: (!) 121     Resp:      Temp: 98.4 F (36.9 C) 98.3 F (36.8 C)    TempSrc: Oral Oral    SpO2: 93% 94% 94% 94%  Weight:  82 kg    Height:        Intake/Output Summary (Last 24 hours) at 12/10/2017 1100 Last data filed at 12/10/2017 0404 Gross per 24 hour  Intake -  Output 1050 ml  Net -1050 ml   Filed Weights   12/09/17 1259 12/10/17 0400  Weight: 74.4 kg 82 kg     Examination:  General exam: Alert, awake, oriented x 3 Respiratory system: Decreased bilateral breath sounds and crackles respiratory effort normal. Cardiovascular system: Tachycardic, regular rhythm. No murmurs, rubs, gallops. Gastrointestinal system: Abdomen is nondistended, soft and nontender. No organomegaly or masses felt. Normal bowel sounds heard. Central nervous system: Alert and oriented. No focal neurological deficits. Extremities: 2-3+ pitting edema bilaterally +pedal pulses Skin: No rashes, lesions or ulcers Psychiatry: Judgement and insight appear normal. Mood & affect appropriate.     Data Reviewed: I have personally reviewed following labs and imaging studies  CBC: Recent Labs  Lab 12/09/17 1304 12/10/17 0815  WBC 8.2 7.8  HGB 10.0* 9.9*  HCT 32.6* 32.5*  MCV 92.6 92.1  PLT 355 347   Basic Metabolic Panel: Recent Labs  Lab 12/09/17 1304 12/10/17 0815  NA 139 140  K 3.8 3.3*  CL 100 98  CO2 28 30  GLUCOSE 115* 98  BUN 7* 7*  CREATININE 0.86 0.94  CALCIUM 8.7* 8.4*  MG  --  1.1*  PHOS  --  3.3   GFR: Estimated Creatinine Clearance: 57.6 mL/min (by C-G formula based on SCr of 0.94 mg/dL). Liver Function Tests: Recent Labs  Lab 12/10/17 0815  AST 16  ALT 11  ALKPHOS 64  BILITOT 1.1  PROT 5.8*  ALBUMIN 2.8*   No results for input(s): LIPASE, AMYLASE in the last 168 hours. No results for input(s): AMMONIA in the last 168 hours. Coagulation Profile: No results for input(s): INR, PROTIME in the last 168 hours. Cardiac Enzymes: Recent Labs  Lab 12/09/17 2011 12/10/17 0331 12/10/17 0815  TROPONINI <0.03 <0.03 <0.03   BNP (last 3 results) No results for input(s): PROBNP in the last 8760 hours. HbA1C: Recent Labs    12/10/17 0331  HGBA1C 5.8*   CBG: Recent Labs  Lab 12/10/17 0002 12/10/17 0349 12/10/17 0836  GLUCAP 103* 112* 100*   Lipid Profile: No results for input(s): CHOL, HDL, LDLCALC, TRIG, CHOLHDL, LDLDIRECT in the  last 72 hours. Thyroid Function Tests: Recent Labs    12/09/17 2011  TSH 1.500  FREET4 1.20   Anemia Panel: No results for input(s): VITAMINB12, FOLATE, FERRITIN, TIBC, IRON, RETICCTPCT in the last 72 hours. Urine analysis:    Component Value Date/Time   COLORURINE YELLOW 11/08/2017 1611   APPEARANCEUR CLEAR 11/08/2017 1611   LABSPEC 1.010 11/08/2017 1611   LABSPEC 1.020 06/18/2011 1547   PHURINE 7.5 11/08/2017 1611   GLUCOSEU NEGATIVE 11/08/2017 1611   HGBUR NEGATIVE 11/08/2017 1611   BILIRUBINUR NEGATIVE 11/08/2017 1611   BILIRUBINUR Neg 12/04/2013 1643   BILIRUBINUR Negative 06/18/2011 1547   KETONESUR NEGATIVE 11/08/2017 1611   PROTEINUR NEGATIVE 08/11/2017 2131   UROBILINOGEN 0.2 11/08/2017 1611   NITRITE NEGATIVE 11/08/2017 1611   LEUKOCYTESUR NEGATIVE 11/08/2017 1611   LEUKOCYTESUR Trace 06/18/2011 1547   Sepsis Labs: @LABRCNTIP (procalcitonin:4,lacticidven:4)  )No results found for this or any previous visit (from the past 240 hour(s)).       Radiology Studies: Dg Chest 2 View  Result Date: 12/09/2017 CLINICAL DATA:  Patient reports sob, nausea, diarrhea, and left chest pain for 1 month. Swelling, and tingling  in hands and feet for 2 months. No blood seen in stool. Hx of adenocarcinoma lung ULL, asthma, atrial fibrillation. EXAM: CHEST - 2 VIEW COMPARISON:  11/10/2017 CT, 11/08/2017 x-ray FINDINGS: The continues to be a masslike consolidation of the RIGHT UPPER lobe. RIGHT pleural effusion has increased. There is RIGHT basilar opacity, partially obscuring the hemidiaphragm. Stable appearance of postoperative changes in the LEFT hilar region. LEFT lung is clear. IMPRESSION: Increased RIGHT pleural effusion and RIGHT basilar opacity. Stable appearance of masslike consolidation in the RIGHT UPPER lobe. Electronically Signed   By: Nolon Nations M.D.   On: 12/09/2017 13:37        Scheduled Meds: . aspirin EC  81 mg Oral Daily  . fluticasone furoate-vilanterol   1 puff Inhalation Daily   And  . umeclidinium bromide  1 puff Inhalation Daily  . furosemide  40 mg Intravenous BID  . guaiFENesin  600 mg Oral BID  . insulin aspart  0-9 Units Subcutaneous Q4H  . loratadine  10 mg Oral Daily  . omeprazole  20 mg Oral Daily  . sodium chloride flush  3 mL Intravenous Q12H   Continuous Infusions: . sodium chloride       LOS: 1 day    Time spent: 35 minutes. Greater than 50% of this time was spent in direct contact with the patient, coordinating care and discussing relevant ongoing clinical issues, including plans to continue diuresis for her acute diastolic heart failure with significant volume overload, plan to add beta-blocker and ACE inhibitor to her medication regimen, possible need for SNF after hospitalization pending PT recommendations.     Lelon Frohlich, MD [facility] Pager [phone]  If 7PM-7AM, please contact night-coverage [email or web address] Password [email or web address] 12/10/2017, 11:00 AM

## 2017-12-11 ENCOUNTER — Inpatient Hospital Stay (HOSPITAL_COMMUNITY): Payer: Medicare Other

## 2017-12-11 DIAGNOSIS — I5033 Acute on chronic diastolic (congestive) heart failure: Secondary | ICD-10-CM

## 2017-12-11 DIAGNOSIS — F329 Major depressive disorder, single episode, unspecified: Secondary | ICD-10-CM

## 2017-12-11 DIAGNOSIS — J441 Chronic obstructive pulmonary disease with (acute) exacerbation: Secondary | ICD-10-CM

## 2017-12-11 DIAGNOSIS — F419 Anxiety disorder, unspecified: Secondary | ICD-10-CM

## 2017-12-11 LAB — GLUCOSE, CAPILLARY
GLUCOSE-CAPILLARY: 119 mg/dL — AB (ref 70–99)
GLUCOSE-CAPILLARY: 199 mg/dL — AB (ref 70–99)
GLUCOSE-CAPILLARY: 200 mg/dL — AB (ref 70–99)
Glucose-Capillary: 161 mg/dL — ABNORMAL HIGH (ref 70–99)
Glucose-Capillary: 163 mg/dL — ABNORMAL HIGH (ref 70–99)
Glucose-Capillary: 195 mg/dL — ABNORMAL HIGH (ref 70–99)
Glucose-Capillary: 199 mg/dL — ABNORMAL HIGH (ref 70–99)

## 2017-12-11 LAB — COMPREHENSIVE METABOLIC PANEL
ALT: 10 U/L (ref 0–44)
AST: 13 U/L — ABNORMAL LOW (ref 15–41)
Albumin: 2.6 g/dL — ABNORMAL LOW (ref 3.5–5.0)
Alkaline Phosphatase: 62 U/L (ref 38–126)
Anion gap: 12 (ref 5–15)
BILIRUBIN TOTAL: 0.6 mg/dL (ref 0.3–1.2)
BUN: 19 mg/dL (ref 8–23)
CHLORIDE: 93 mmol/L — AB (ref 98–111)
CO2: 32 mmol/L (ref 22–32)
Calcium: 8 mg/dL — ABNORMAL LOW (ref 8.9–10.3)
Creatinine, Ser: 1.06 mg/dL — ABNORMAL HIGH (ref 0.44–1.00)
GFR calc non Af Amer: 51 mL/min — ABNORMAL LOW (ref >60)
GFR, EST AFRICAN AMERICAN: 59 mL/min — AB (ref >60)
Glucose, Bld: 191 mg/dL — ABNORMAL HIGH (ref 70–99)
POTASSIUM: 3.3 mmol/L — AB (ref 3.5–5.1)
Sodium: 137 mmol/L (ref 135–145)
TOTAL PROTEIN: 5.4 g/dL — AB (ref 6.5–8.1)

## 2017-12-11 LAB — CBC
HEMATOCRIT: 30 % — AB (ref 36.0–46.0)
Hemoglobin: 9.4 g/dL — ABNORMAL LOW (ref 12.0–15.0)
MCH: 28.2 pg (ref 26.0–34.0)
MCHC: 31.3 g/dL (ref 30.0–36.0)
MCV: 90.1 fL (ref 78.0–100.0)
PLATELETS: 372 10*3/uL (ref 150–400)
RBC: 3.33 MIL/uL — AB (ref 3.87–5.11)
RDW: 14.4 % (ref 11.5–15.5)
WBC: 7.1 10*3/uL (ref 4.0–10.5)

## 2017-12-11 LAB — ECHOCARDIOGRAM COMPLETE
HEIGHTINCHES: 66 in
WEIGHTICAEL: 2696 [oz_av]

## 2017-12-11 MED ORDER — POTASSIUM CHLORIDE CRYS ER 20 MEQ PO TBCR
40.0000 meq | EXTENDED_RELEASE_TABLET | ORAL | Status: AC
  Administered 2017-12-11 (×2): 40 meq via ORAL
  Filled 2017-12-11: qty 2

## 2017-12-11 MED ORDER — METHYLPREDNISOLONE SODIUM SUCC 40 MG IJ SOLR
40.0000 mg | Freq: Every day | INTRAMUSCULAR | Status: DC
  Administered 2017-12-12: 40 mg via INTRAVENOUS
  Filled 2017-12-11: qty 1

## 2017-12-11 MED ORDER — FUROSEMIDE 40 MG PO TABS
40.0000 mg | ORAL_TABLET | Freq: Every day | ORAL | Status: DC
  Administered 2017-12-12 – 2017-12-14 (×3): 40 mg via ORAL
  Filled 2017-12-11 (×3): qty 1

## 2017-12-11 NOTE — Progress Notes (Signed)
[name]  VWU:[id number] DOB: [birth year] DOA: 12/09/2017 PCP: [name], MD    LOS: 2 days   Reason for Consult / Chief Complaint:  COPD excerbation   Consulting MD and date:  Dr. Deniece Ree [facility] 12/10/2017  HPI/Summary of hospital stay:  72 year old female former smoker with known history of stage III adenocarcinoma of the right upper lung, severe COPD and chronic hypoxic respiratory failure on home oxygen at 4 L admitted to the hospital on December 09, 2017 for progressive shortness of breath and lower extremity edema, diarrhea and progressive weakness . Patient was felt to have underlying COPD exacerbation and decompensated diastolic heart failure.  She was started on Lasix 40 mg IV twice daily.  A 2D echo is pending.  She had negative troponins and EKG.Cardiology has been consulted.  She was started on azithromycin and systemic steroids.  PCCM was consulted 12/10/17.  Patient is the office patient of [name].  She is followed by oncology Dr. [name] for stage IIIa adenocarcinoma of the right upper lobe..  She has been on immunotherapy but is currently on hold for the last few months.  Most recent CT chest done on November 10, 2017 showed a stable right suprahilar soft tissue density that impacts the right upper lobe airway.  There was slightly improved right upper lobe aeration with some mixed groundglass density, multiple stable pulmonary nodules and a new right lower lobe nodule measuring 6 mm.  She had a stable to slightly increased right pleural effusion and scattered emphysematous changes.  Chest x-ray on admission showed a increased right pleural effusion and right basilar opacity. C Diff panel is pending .  Pt says she is very weak, has no energy . Wears out with minimal activity .  Has been sick for months and feels she is getting weaker each day.        Subjective:  Improved today , only 1 stool since admit (was unable to send for cx /panel )  O2 sats good at rest ,  HR  has come down , HR mid 80s    Objective   Blood pressure 108/60, pulse 78, temperature 97.6 F (36.4 C), temperature source Oral, resp. rate 20, height 5\' 6"  (1.676 m), weight 76.4 kg, SpO2 93 %.    FiO2 (%):  [36 %] 36 %   Intake/Output Summary (Last 24 hours) at 12/11/2017 1031 Last data filed at 12/11/2017 0956 Gross per 24 hour  Intake 1320 ml  Output 1250 ml  Net 70 ml   Filed Weights   12/09/17 1259 12/10/17 0400 12/11/17 0251  Weight: 74.4 kg 82 kg 76.4 kg    Examination: General: elderly , frail , nad  HENT: AT , [place] , MMM   Lungs:  CTA decreased BS in bases   Cardiovascular RRR , no mrg Abdomen: soft BS + no guarding  Extremities: intact w/ +1 edema  Neuro: alert, no deficits    Consults: date of consult/date signed off & final recs:  Consult  Date 12/10/17    Procedures:   Significant Diagnostic Tests: 2 D Echo 8/31>>  Micro Data: GI panel 8/30 >>  Antimicrobials:  8./31 Azithromycin oral  >>8/31 8/31 Azithromycin IV >> 8/31 Rocephin >>   Resolved Hospital Problem list    Assessment & Plan:  1. Acute COPD Exacerbation +/- PNA  Plan  Pulmonary hygiene  Cont Nebs  IS /Flutter   Cont IV abx  Wean steroids as able  Mobilize pt  Limit sedation Call pulmonary if needed, set up OP follow up after discharge.    2. Acute on chronic hypoxic Resp Failrue  Plan  O2 to keep O2 sats >90%  3. Decompensated Diastolic CHF  Plan  2 Echo pending  Diuresis as b/p and scr will allow   4. Tachycardia improved   Plan  Echo pending    5. Diarrhea ? Etiology -not an issue since admit  Plan  GI panel pending   PPI . Stopped 8/31   6. Stage III a LUng Cancer  Plan  Keep OP ONcology follow up   Disposition / Summary of Today's Plan 12/11/17   72 yo female with known stage IIIa lung cancer, immunotherapy on hold with progressive decline admitted with COPD exacerbation , decompensated CHF  . Appears improved with good O2 sats and resolved tachycardia .    Best Practice / Goals of Care / Disposition.   DVT prophylaxis: SCD  GI prophylaxis: none  Diet: reg  Mobility: as tolerated  Code Status: DNR  Family Communication: updated pt at bedside 9/1   Labs   CBC: Recent Labs  Lab 12/09/17 1304 12/10/17 0815 12/11/17 0641  WBC 8.2 7.8 7.1  HGB 10.0* 9.9* 9.4*  HCT 32.6* 32.5* 30.0*  MCV 92.6 92.1 90.1  PLT 355 372 650   Basic Metabolic Panel: Recent Labs  Lab 12/09/17 1304 12/10/17 0815 12/11/17 0641  NA 139 140 137  K 3.8 3.3* 3.3*  CL 100 98 93*  CO2 28 30 32  GLUCOSE 115* 98 191*  BUN 7* 7* 19  CREATININE 0.86 0.94 1.06*  CALCIUM 8.7* 8.4* 8.0*  MG  --  1.1*  --   PHOS  --  3.3  --    GFR: Estimated Creatinine Clearance: 49.3 mL/min (A) (by C-G formula based on SCr of 1.06 mg/dL (H)). Recent Labs  Lab 12/09/17 1304 12/10/17 0815 12/11/17 0641  WBC 8.2 7.8 7.1   Liver Function Tests: Recent Labs  Lab 12/10/17 0815 12/11/17 0641  AST 16 13*  ALT 11 10  ALKPHOS 64 62  BILITOT 1.1 0.6  PROT 5.8* 5.4*  ALBUMIN 2.8* 2.6*   No results for input(s): LIPASE, AMYLASE in the last 168 hours. No results for input(s): AMMONIA in the last 168 hours. ABG    Component Value Date/Time   TCO2 31 03/08/2016 1717    Coagulation Profile: No results for input(s): INR, PROTIME in the last 168 hours. Cardiac Enzymes: Recent Labs  Lab 12/09/17 2011 12/10/17 0331 12/10/17 0815  TROPONINI <0.03 <0.03 <0.03   HbA1C: Hgb A1c MFr Bld  Date/Time Value Ref Range Status  12/10/2017 03:31 AM 5.8 (H) 4.8 - 5.6 % Final    Comment:    (NOTE) Pre diabetes:          5.7%-6.4% Diabetes:              >6.4% Glycemic control for   <7.0% adults with diabetes   07/04/2017 04:45 PM 5.9 4.6 - 6.5 % Final    Comment:    Glycemic Control Guidelines for People with Diabetes:Non Diabetic:  <6%Goal of Therapy: <7%Additional Action Suggested:  >8%    CBG: Recent Labs  Lab 12/10/17 1648 12/10/17 1955 12/11/17 0013  12/11/17 0458 12/11/17 0758  GLUCAP 162* 230* 163* 200* 161*    Tammy Parrett NP-C  [facility]  [phone]   12/11/2017

## 2017-12-11 NOTE — Progress Notes (Signed)
  Echocardiogram 2D Echocardiogram has been performed.  [name] 12/11/2017, 1:32 PM

## 2017-12-11 NOTE — Progress Notes (Addendum)
Per pt she wants to do her thoracentesis tomorrow, it is so little time and she is not prepared and her kids out of town till tomorrow to let them know and she wants them to be here, RN called radiology and paged ordering MD.  Palma Holter, RN

## 2017-12-11 NOTE — Progress Notes (Signed)
PROGRESS NOTE    [name]  [id number]:[id number] DOB: Aug 11, 1945 DOA: 12/09/2017 PCP: [name], MD    Brief Narrative:  72 year old female who presented with chest pain and dyspnea.  She does have significant past medical history for type 2 diabetes mellitus, stage III lung adenocarcinoma right upper lobe, asthma, COPD with chronic hypoxic respiratory failure, atrial fibrillation, diastolic heart failure, coronary artery disease and history of deep vein thrombosis.  Patient reported not feeling well few months, including upper and lower respiratory tract infection treated with antibiotics, with mild improvement of her symptoms, but now she had recurrent symptoms with dyspnea on exertion, associated with diarrhea.  On her initial physical examination blood pressure was 145/52, heart rate 77, respiratory rate 22, temperature 99.3, oxygen saturation 100% on oxygen.  Her lungs had no wheezing, no rales or rhonchi, abdomen soft nontender, positive lower extremity edema.  Sodium 139, potassium 3.9, chloride 100, bicarb 28, glucose 115, BUN 7, creatinine 0.86, white count 8.2, hemoglobin 10.0, hematocrit 32.6, platelets 355.  X-ray with a right upper lobe infiltrate, new right-sided effusion.  EKG normal sinus rhythm.  Patient was admitted to the hospital with a working diagnosis acute on chronic decompensation of diastolic heart failure  Assessment & Plan:   Active Problems:   Essential hypertension   COPD (chronic obstructive pulmonary disease) (HCC)   Respiratory failure with hypoxia (HCC)   Primary cancer of right upper lobe of lung (HCC)   Anemia   Restless legs syndrome (RLS)   GERD (gastroesophageal reflux disease)   DM type 2 with diabetic peripheral neuropathy (HCC)   Anxiety and depression   CAD (coronary artery disease)   Inappropriate sinus tachycardia   Acute on chronic diastolic CHF (congestive heart failure) (HCC)   Pleural effusion   Chest pain   1. Acute on chronic  diastolic heart failure exacerbation. Will continue gentle diuresis to keep negative fluid balance. Will continue blood pressure control with lisinopril and metoprolol.   2. COPD exacerbation. Continue bronchodilator therapy with levalbuterol and ipratropium, oxymetry monitoring and systemic steroids. Will decrease dose of methylprednisolone to 40 mg daily. Continue budesonide.   3. T2DM. Continue glucose cover and monitoring with insulin sliding scale, capillary glucose 230, 163, 200, 161, 199. Patient is tolerating po well.   4. HTN. Continue blood pressure control with lisinopril and metoprolol.   5. Right upper lobe lung adenocarcinoma, complicated with right pleural effusion. New effusion on the right side, personally reviewed imaging, will plant for IR US guided thoracentesis, follow fluids analysis, to rule out a malignant effusion or para-pneumonic effusion.   6. Anxiety. Continue alprazolam.   7. Hypokalemia. Will continue potassium correction with po Kcl. Follow on electrolytes in am, renal function continue to be preserved with serum cr at 1.0.    DVT prophylaxis: enoxaparin   Code Status:  dnr  Family Communication: no family at the bedside  Disposition Plan/ discharge barriers: pending clinical improvement.    Consultants:     Procedures:     Antimicrobials:   Azithromycin.     Subjective: Patient feeling better, but not back to baseline yet. At home uses supplemental 02 all the time 4 LPM per [place]. Lived with her daughter. No chest pain, no nausea or vomiting.   Objective: Vitals:   12/11/17 0251 12/11/17 0526 12/11/17 0807 12/11/17 0912  BP:  (!) 112/49  108/60  Pulse:  79  78  Resp:  20    Temp:  97.6 F (36.4 C)  TempSrc:  Oral    SpO2:  95% 93%   Weight: 76.4 kg     Height:        Intake/Output Summary (Last 24 hours) at 12/11/2017 1007 Last data filed at 12/11/2017 0956 Gross per 24 hour  Intake 1320 ml  Output 1250 ml  Net 70 ml   Filed  Weights   12/09/17 1259 12/10/17 0400 12/11/17 0251  Weight: 74.4 kg 82 kg 76.4 kg    Examination:   General: Not in pain or dyspnea, deconditioned  Neurology: Awake and alert, non focal  E ENT: mild pallor, no icterus, oral mucosa moist Cardiovascular: No JVD. S1-S2 present, rhythmic, no gallops, rubs, or murmurs. +/++ pitting lower extremity edema. Pulmonary: positive breath sounds bilaterally, decreased breath sounds at the right base, no wheezing or  Rhonchi, scattered rales. Gastrointestinal. Abdomen with no organomegaly, non tender, no rebound or guarding Skin. No rashes Musculoskeletal: no joint deformities     Data Reviewed: I have personally reviewed following labs and imaging studies  CBC: Recent Labs  Lab 12/09/17 1304 12/10/17 0815 12/11/17 0641  WBC 8.2 7.8 7.1  HGB 10.0* 9.9* 9.4*  HCT 32.6* 32.5* 30.0*  MCV 92.6 92.1 90.1  PLT 355 372 308   Basic Metabolic Panel: Recent Labs  Lab 12/09/17 1304 12/10/17 0815 12/11/17 0641  NA 139 140 137  K 3.8 3.3* 3.3*  CL 100 98 93*  CO2 28 30 32  GLUCOSE 115* 98 191*  BUN 7* 7* 19  CREATININE 0.86 0.94 1.06*  CALCIUM 8.7* 8.4* 8.0*  MG  --  1.1*  --   PHOS  --  3.3  --    GFR: Estimated Creatinine Clearance: 49.3 mL/min (A) (by C-G formula based on SCr of 1.06 mg/dL (H)). Liver Function Tests: Recent Labs  Lab 12/10/17 0815 12/11/17 0641  AST 16 13*  ALT 11 10  ALKPHOS 64 62  BILITOT 1.1 0.6  PROT 5.8* 5.4*  ALBUMIN 2.8* 2.6*   No results for input(s): LIPASE, AMYLASE in the last 168 hours. No results for input(s): AMMONIA in the last 168 hours. Coagulation Profile: No results for input(s): INR, PROTIME in the last 168 hours. Cardiac Enzymes: Recent Labs  Lab 12/09/17 2011 12/10/17 0331 12/10/17 0815  TROPONINI <0.03 <0.03 <0.03   BNP (last 3 results) No results for input(s): PROBNP in the last 8760 hours. HbA1C: Recent Labs    12/10/17 0331  HGBA1C 5.8*   CBG: Recent Labs  Lab  12/10/17 1648 12/10/17 1955 12/11/17 0013 12/11/17 0458 12/11/17 0758  GLUCAP 162* 230* 163* 200* 161*   Lipid Profile: No results for input(s): CHOL, HDL, LDLCALC, TRIG, CHOLHDL, LDLDIRECT in the last 72 hours. Thyroid Function Tests: Recent Labs    12/09/17 2011  TSH 1.500  FREET4 1.20   Anemia Panel: No results for input(s): VITAMINB12, FOLATE, FERRITIN, TIBC, IRON, RETICCTPCT in the last 72 hours.    Radiology Studies: I have reviewed all of the imaging during this hospital visit personally     Scheduled Meds: . aspirin EC  81 mg Oral Daily  . budesonide (PULMICORT) nebulizer solution  0.25 mg Nebulization BID  . furosemide  40 mg Intravenous BID  . guaiFENesin  600 mg Oral BID  . insulin aspart  0-9 Units Subcutaneous Q4H  . ipratropium  0.5 mg Nebulization Q6H  . levalbuterol  0.63 mg Nebulization Q6H  . lisinopril  2.5 mg Oral Daily  . loratadine  10 mg Oral Daily  .  methylPREDNISolone (SOLU-MEDROL) injection  60 mg Intravenous Q12H  . metoprolol tartrate  25 mg Oral BID  . sodium chloride flush  3 mL Intravenous Q12H   Continuous Infusions: . sodium chloride    . azithromycin    . cefTRIAXone (ROCEPHIN)  IV 1 g (12/10/17 1510)     LOS: 2 days        Mauricio Gerome Apley, MD [facility] Pager [phone]

## 2017-12-11 NOTE — Progress Notes (Signed)
Pt's enteric precaution is discontinued as per verbal order from MD  Palma Holter, RN

## 2017-12-11 NOTE — Plan of Care (Signed)
  Problem: Pain Managment: Goal: General experience of comfort will improve Outcome: Progressing   

## 2017-12-11 NOTE — Progress Notes (Signed)
Occupational Therapy Treatment Patient Details Name: [name] MRN: [id number] DOB: 03-12-1946 Today's Date: 12/11/2017    History of present illness Pt is a 72 y.o. female admitted 12/09/17 with SOB, BLE edema and fatigue. Worked up for CHF exacerbation. 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   OT comments  Energy conservation handout provided   Follow Up Recommendations  No OT follow up;Supervision/Assistance - 24 hour    Equipment Recommendations  None recommended by OT(Pt has appropriate DME)    Recommendations for Other Services      Precautions / Restrictions Precautions Precautions: Fall Restrictions Weight Bearing Restrictions: No       Mobility Bed Mobility Overal bed mobility: Needs Assistance Bed Mobility: Supine to Sit;Sit to Supine     Supine to sit: Supervision;HOB elevated Sit to supine: Min guard      Transfers Overall transfer level: Needs assistance Equipment used: Rolling walker (2 wheeled) Transfers: Sit to/from [facility] Sit to Stand: Supervision;Min guard Stand pivot transfers: Min guard                ADL either performed or assessed with clinical judgement   ADL Overall ADL's : Needs assistance/impaired                         Toilet Transfer: Min guard;RW;Stand-pivot;Cueing for safety;Cueing for sequencing   Toileting- Water quality scientist and Hygiene: Min guard;Sit to/from stand;Cueing for safety         General ADL Comments: Energy conservation handout provided and gone over in detail. Pt able to verbalized examples of ways she can use strategies at home     Vision Baseline Vision/History: No visual deficits            Cognition Arousal/Alertness: Awake/alert Behavior During Therapy: WFL for tasks assessed/performed Overall Cognitive Status: No family/caregiver present to determine baseline cognitive functioning(pt  did seem a bit confused at times regarding location of room.  she initially thought she was waiting on a room) Area of Impairment: Orientation;Attention;Memory;Problem solving                 Orientation Level: (thought that Clinton was president) Current Attention Level: Selective Memory: Decreased short-term memory       Problem Solving: Slow processing;Requires verbal cues General Comments: Initially stating Clinton was president. Poor memory and attention. Likely baseline cognition                   Pertinent Vitals/ Pain       Pain Assessment: No/denies pain     Prior Functioning/Environment              Frequency  Min 2X/week        Progress Toward Goals  OT Goals(current goals can now be found in the care plan section)  Progress towards OT goals: Progressing toward goals     Plan Discharge plan remains appropriate    Co-evaluation                 AM-PAC PT "6 Clicks" Daily Activity     Outcome Measure   Help from another person eating meals?: None Help from another person taking care of personal grooming?: A Little Help from another person toileting, which includes using toliet, bedpan, or urinal?: A Little Help from another person bathing (including washing, rinsing, drying)?: A Little Help from another person to put on and taking  off regular upper body clothing?: A Little Help from another person to put on and taking off regular lower body clothing?: A Lot 6 Click Score: 18    End of Session Equipment Utilized During Treatment: Gait belt;Rolling walker;Oxygen(6L throughout mobility)  OT Visit Diagnosis: Unsteadiness on feet (R26.81);Other abnormalities of gait and mobility (R26.89);Muscle weakness (generalized) (M62.81);Other symptoms and signs involving cognitive function   Activity Tolerance Patient tolerated treatment well   Patient Left with SCD's reapplied;in bed;with bed alarm set   Nurse Communication Mobility status         Time: 1034-1100 OT Time Calculation (min): 26 min  Charges: OT Treatments $Self Care/Home Management : 23-37 mins  [place], [place]   Betsy Pries 12/11/2017, 12:59 PM

## 2017-12-12 ENCOUNTER — Inpatient Hospital Stay (HOSPITAL_COMMUNITY): Payer: Medicare Other

## 2017-12-12 DIAGNOSIS — I2510 Atherosclerotic heart disease of native coronary artery without angina pectoris: Secondary | ICD-10-CM

## 2017-12-12 LAB — BODY FLUID CELL COUNT WITH DIFFERENTIAL
Lymphs, Fluid: 40 %
Monocyte-Macrophage-Serous Fluid: 40 % — ABNORMAL LOW (ref 50–90)
Neutrophil Count, Fluid: 20 % (ref 0–25)
WBC FLUID: 533 uL (ref 0–1000)

## 2017-12-12 LAB — T3: T3 TOTAL: 71 ng/dL (ref 71–180)

## 2017-12-12 LAB — GLUCOSE, CAPILLARY
GLUCOSE-CAPILLARY: 198 mg/dL — AB (ref 70–99)
Glucose-Capillary: 118 mg/dL — ABNORMAL HIGH (ref 70–99)
Glucose-Capillary: 103 mg/dL — ABNORMAL HIGH (ref 70–99)
Glucose-Capillary: 145 mg/dL — ABNORMAL HIGH (ref 70–99)
Glucose-Capillary: 207 mg/dL — ABNORMAL HIGH (ref 70–99)

## 2017-12-12 LAB — GRAM STAIN

## 2017-12-12 LAB — LACTATE DEHYDROGENASE, PLEURAL OR PERITONEAL FLUID: LD, Fluid: 108 U/L — ABNORMAL HIGH (ref 3–23)

## 2017-12-12 LAB — LACTATE DEHYDROGENASE: LDH: 132 U/L (ref 98–192)

## 2017-12-12 LAB — BASIC METABOLIC PANEL
Anion gap: 11 (ref 5–15)
BUN: 21 mg/dL (ref 8–23)
CO2: 32 mmol/L (ref 22–32)
Calcium: 8 mg/dL — ABNORMAL LOW (ref 8.9–10.3)
Chloride: 98 mmol/L (ref 98–111)
Creatinine, Ser: 0.98 mg/dL (ref 0.44–1.00)
GFR calc Af Amer: >60 mL/min (ref >60)
GFR, EST NON AFRICAN AMERICAN: 56 mL/min — AB (ref >60)
GLUCOSE: 122 mg/dL — AB (ref 70–99)
Potassium: 3.3 mmol/L — ABNORMAL LOW (ref 3.5–5.1)
Sodium: 141 mmol/L (ref 135–145)

## 2017-12-12 LAB — GLUCOSE, PLEURAL OR PERITONEAL FLUID: GLUCOSE FL: 131 mg/dL

## 2017-12-12 LAB — PROTEIN, PLEURAL OR PERITONEAL FLUID: Total protein, fluid: <3 g/dL

## 2017-12-12 MED ORDER — FLUTICASONE PROPIONATE 50 MCG/ACT NA SUSP
2.0000 | Freq: Every day | NASAL | Status: DC
  Administered 2017-12-12 – 2017-12-13 (×2): 2 via NASAL
  Filled 2017-12-12: qty 16

## 2017-12-12 MED ORDER — AZITHROMYCIN 500 MG PO TABS
500.0000 mg | ORAL_TABLET | Freq: Every day | ORAL | Status: DC
  Administered 2017-12-12 – 2017-12-15 (×4): 500 mg via ORAL
  Filled 2017-12-12 (×4): qty 1

## 2017-12-12 MED ORDER — SALINE SPRAY 0.65 % NA SOLN
1.0000 | Freq: Three times a day (TID) | NASAL | Status: DC
  Administered 2017-12-12 – 2017-12-15 (×10): 1 via NASAL
  Filled 2017-12-12: qty 44

## 2017-12-12 MED ORDER — LIDOCAINE HCL (PF) 1 % IJ SOLN
INTRAMUSCULAR | Status: AC
  Filled 2017-12-12: qty 30

## 2017-12-12 MED ORDER — POTASSIUM CHLORIDE CRYS ER 20 MEQ PO TBCR
40.0000 meq | EXTENDED_RELEASE_TABLET | ORAL | Status: AC
  Administered 2017-12-12 (×2): 40 meq via ORAL
  Filled 2017-12-12 (×2): qty 2

## 2017-12-12 NOTE — Progress Notes (Signed)
PROGRESS NOTE    KANITA DELAGE  IRW:[id number] DOB: 1945-06-10 DOA: 12/09/2017 PCP: [name], MD    Brief Narrative:  72 year old female who presented with chest pain and dyspnea.  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  Patient reported not feeling well for few months, including upper and lower respiratory tract infections treated with antibiotics, with mild improvement of her symptoms, but then she had recurrent symptoms with significant dyspnea on exertion, associated with diarrhea.  On her initial physical examination blood pressure was 145/52, heart rate 77, respiratory rate 22, temperature 99.3, oxygen saturation 100% on supplemental oxygen.  Her lungs had no wheezing, no rales or rhonchi, abdomen soft nontender, positive lower extremity edema.  Sodium 139, potassium 3.9, chloride 100, bicarb 28, glucose 115, BUN 7, creatinine 0.86, white count 8.2, hemoglobin 10.0, hematocrit 32.6, platelets 355. Chest x-ray with a right upper lobe infiltrate, new right-sided effusion.  EKG normal sinus rhythm.  Patient was admitted to the hospital with a working diagnosis acute on chronic decompensation of diastolic heart failure.    Assessment & Plan:   Active Problems:   Essential hypertension   COPD (chronic obstructive pulmonary disease) (HCC)   Respiratory failure with hypoxia (HCC)   Primary cancer of right upper lobe of lung (HCC)   Anemia   Restless legs syndrome (RLS)   GERD (gastroesophageal reflux disease)   DM type 2 with diabetic peripheral neuropathy (HCC)   Anxiety and depression   CAD (coronary artery disease)   Inappropriate sinus tachycardia   Acute on chronic diastolic CHF (congestive heart failure) (HCC)   Pleural  effusion   Chest pain   1. Acute on chronic diastolic heart failure exacerbation. Urine output over last 24 hours 1,225 ml, will continue furosemide for diuresis with 40 mg daily, continue heart failure management with lisinopril and metoprolol.   2. COPD exacerbation. Positive nasal congestion and postnasal driping, will add flonase and saline nasal spry. Continue  bronchodilator therapy with levalbuterol and ipratropium, inhaled corticosteroids, oxymetry monitoring and systemic steroids.   3. T2DM with steroid induced hyperglycemia. Continue glucose cover and monitoring with insulin sliding scale, capillary glucose 199, 119, 118, 103, 145. Improved glycemia with reduction of systemic steroids.   4. HTN. On lisinopril and metoprolol, blood pressure systolic 761 to 950 mmHg.   5. Right upper lobe lung adenocarcinoma, complicated with right pleural effusion. US guided thoracentesis, removed 1.4 l of clear yellow fluid, seems to be transudate per protein ration, but LDH is 108 suggesting exudate. Will follow on cytology and radiographic surveillance.     6. Anxiety. On alprazolam, no agitation or confusion.   7. Diuretic induced hypokalemia. Serum K at 3,3 with preserved renal function with serum cr at 0.98, will order 80 meq kcl in 2 divided doses and will follow on renal panel in am.   DVT prophylaxis: enoxaparin   Code Status:  dnr  Family Communication: no family at the bedside  Disposition Plan/ discharge barriers: pending clinical improvement.    Consultants:     Procedures:     Antimicrobials:   Azithromycin.     Subjective: Mild improvement in dyspnea, but continue to have persistent symptoms, not back to baseline, positive nasal congestion and postnasal dripping. No chest pain, no nausea or vomiting.   Objective:  Vitals:   12/11/17 2122 12/12/17 0135 12/12/17 0432 12/12/17 0808  BP: (!) 148/54  (!) 127/52   Pulse: 97  79   Resp: 19  18   Temp:    97.7 F (36.5 C)   TempSrc:   Oral   SpO2: 94% 92% 91% 91%  Weight:   77.4 kg   Height:        Intake/Output Summary (Last 24 hours) at 12/12/2017 0907 Last data filed at 12/12/2017 0818 Gross per 24 hour  Intake 846 ml  Output 1525 ml  Net -679 ml   Filed Weights   12/10/17 0400 12/11/17 0251 12/12/17 0432  Weight: 82 kg 76.4 kg 77.4 kg    Examination:   General: deconditioned and ill looking appearing  Neurology: Awake and alert, non focal  E ENT: mild pallor, no icterus, oral mucosa moist Cardiovascular: No JVD. S1-S2 present, rhythmic, no gallops, rubs, or murmurs. +++ pitting lower extremity edema. Pulmonary: positive breath sounds bilaterally, decreased breath sounds at the right base, positive expiratory wheezing, with scattered rhonchi and rales. Gastrointestinal. Abdomen with no organomegaly, non tender, no rebound or guarding Skin. No rashes Musculoskeletal: no joint deformities     Data Reviewed: I have personally reviewed following labs and imaging studies  CBC: Recent Labs  Lab 12/09/17 1304 12/10/17 0815 12/11/17 0641  WBC 8.2 7.8 7.1  HGB 10.0* 9.9* 9.4*  HCT 32.6* 32.5* 30.0*  MCV 92.6 92.1 90.1  PLT 355 372 458   Basic Metabolic Panel: Recent Labs  Lab 12/09/17 1304 12/10/17 0815 12/11/17 0641 12/12/17 0516  NA 139 140 137 141  K 3.8 3.3* 3.3* 3.3*  CL 100 98 93* 98  CO2 28 30 32 32  GLUCOSE 115* 98 191* 122*  BUN 7* 7* 19 21  CREATININE 0.86 0.94 1.06* 0.98  CALCIUM 8.7* 8.4* 8.0* 8.0*  MG  --  1.1*  --   --   PHOS  --  3.3  --   --    GFR: Estimated Creatinine Clearance: 53.7 mL/min (by C-G formula based on SCr of 0.98 mg/dL). Liver Function Tests: Recent Labs  Lab 12/10/17 0815 12/11/17 0641  AST 16 13*  ALT 11 10  ALKPHOS 64 62  BILITOT 1.1 0.6  PROT 5.8* 5.4*  ALBUMIN 2.8* 2.6*   No results for input(s): LIPASE, AMYLASE in the last 168 hours. No results for input(s): AMMONIA in the last 168 hours. Coagulation  Profile: No results for input(s): INR, PROTIME in the last 168 hours. Cardiac Enzymes: Recent Labs  Lab 12/09/17 2011 12/10/17 0331 12/10/17 0815  TROPONINI <0.03 <0.03 <0.03   BNP (last 3 results) No results for input(s): PROBNP in the last 8760 hours. HbA1C: Recent Labs    12/10/17 0331  HGBA1C 5.8*   CBG: Recent Labs  Lab 12/11/17 1530 12/11/17 2018 12/11/17 2343 12/12/17 0335 12/12/17 0748  GLUCAP 195* 199* 119* 118* 103*   Lipid Profile: No results for input(s): CHOL, HDL, LDLCALC, TRIG, CHOLHDL, LDLDIRECT in the last 72 hours. Thyroid Function Tests: Recent Labs    12/09/17 2011  TSH 1.500  FREET4 1.20   Anemia Panel: No results for input(s): VITAMINB12, FOLATE, FERRITIN, TIBC, IRON, RETICCTPCT in the last 72 hours.    Radiology Studies: I have reviewed all of the imaging during this hospital visit personally     Scheduled Meds: . aspirin EC  81 mg Oral Daily  . budesonide (PULMICORT) nebulizer solution  0.25 mg Nebulization BID  . furosemide  40 mg Oral Daily  . guaiFENesin  600 mg Oral BID  . insulin aspart  0-9 Units Subcutaneous Q4H  . ipratropium  0.5 mg Nebulization Q6H  . levalbuterol  0.63 mg Nebulization Q6H  . lisinopril  2.5 mg Oral Daily  . loratadine  10 mg Oral Daily  . methylPREDNISolone (SOLU-MEDROL) injection  40 mg Intravenous Daily  . metoprolol tartrate  25 mg Oral BID  . sodium chloride flush  3 mL Intravenous Q12H   Continuous Infusions: . sodium chloride    . azithromycin 500 mg (12/11/17 1140)     LOS: 3 days        Rashonda Warrior Gerome Apley, MD [facility] Pager [phone]

## 2017-12-12 NOTE — Progress Notes (Signed)
Patient returned from thoracentesis with mild tenderness in her back.  Alert and oriented.  Bandaid clean, dry, and intact.  Will continue to monitor.

## 2017-12-12 NOTE — Procedures (Signed)
PROCEDURE SUMMARY:  Successful US guided right thoracentesis. Yielded 1.4 L of clear yellow fluid. Pt tolerated procedure well. No immediate complications.  Specimen was sent for labs. CXR ordered.  Ascencion Dike PA-C 12/12/2017 10:03 AM

## 2017-12-12 NOTE — Progress Notes (Signed)
Physical Therapy Treatment Patient Details Name: [name] MRN: [id number] DOB: Dec 15, 1945 Today's Date: 12/12/2017    History of Present Illness Pt is a 72 y.o. female admitted 12/09/17 with SOB, BLE edema and fatigue. CHF exacerbation and pleural effusion planned thoracentesis 9/2. 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    PT Comments    PT pleasant and HOH but concerned over possible thoracentesis today. RN entered during session to clarify procedure and clarified that PA will discuss with pt prior to procedure. Pt utilized RW for gait today and reports gait improved with RW and appreciated ability to walk and be OOB today. Encouraged continued RW use. Will continue to follow to maximize gait with pt reporting baseline limited gait grossly 30' recently.   91% on 4L with gait HR 79    Follow Up Recommendations  Home health PT;Supervision for mobility/OOB     Equipment Recommendations  None recommended by PT    Recommendations for Other Services       Precautions / Restrictions Precautions Precautions: Fall    Mobility  Bed Mobility   Bed Mobility: Supine to Sit     Supine to sit: HOB elevated;Min guard     General bed mobility comments: guarding for lines and cues to initiate  Transfers Overall transfer level: Needs assistance   Transfers: Sit to/from Stand Sit to Stand: Supervision         General transfer comment: no cues for hand placement needed, cues to scoot to EOB  Ambulation/Gait Ambulation/Gait assistance: Supervision Gait Distance (Feet): 22 Feet Assistive device: Rolling walker (2 wheeled) Gait Pattern/deviations: Step-through pattern;Decreased stride length   Gait velocity interpretation: >2.62 ft/sec, indicative of community ambulatory General Gait Details: 55' to bathroom then 24' in room with pt DOE 3/4 last 3' of gait. pt reports increased tolerance with use of RW for  gait. cues for posture and position in rW   Stairs             Wheelchair Mobility    Modified Rankin (Stroke Patients Only)       Balance Overall balance assessment: Needs assistance   Sitting balance-[name] Scale: Good       Standing balance-[name] Scale: Fair                              Cognition Arousal/Alertness: Awake/alert Behavior During Therapy: WFL for tasks assessed/performed   Area of Impairment: Memory                     Memory: Decreased short-term memory         General Comments: pt unable to recall informaation regarding pending thoracentesis      Exercises      General Comments        Pertinent Vitals/Pain Pain Assessment: No/denies pain    Home Living                      Prior Function            PT Goals (current goals can now be found in the care plan section) Progress towards PT goals: Progressing toward goals    Frequency    Min 3X/week      PT Plan Current plan remains appropriate    Co-evaluation              AM-PAC  PT "6 Clicks" Daily Activity  Outcome Measure  Difficulty turning over in bed (including adjusting bedclothes, sheets and blankets)?: A Little Difficulty moving from lying on back to sitting on the side of the bed? : A Little Difficulty sitting down on and standing up from a chair with arms (e.g., wheelchair, bedside commode, etc,.)?: A Little Help needed moving to and from a bed to chair (including a wheelchair)?: A Little Help needed walking in hospital room?: A Little Help needed climbing 3-5 steps with a railing? : A Little 6 Click Score: 18    End of Session Equipment Utilized During Treatment: Gait belt;Oxygen Activity Tolerance: Patient limited by fatigue Patient left: in chair;with call bell/phone within reach;with chair alarm set;with family/visitor present;with nursing/sitter in room Nurse Communication: Mobility status PT Visit Diagnosis: Other  abnormalities of gait and mobility (R26.89)     Time: 1216-2446 PT Time Calculation (min) (ACUTE ONLY): 22 min  Charges:  $Gait Training: 8-22 mins                     [place] Pager: [phone] Office: [place] 12/12/2017, 8:29 AM

## 2017-12-12 NOTE — Progress Notes (Signed)
Patient requesting a shower. MD paged for order.

## 2017-12-13 ENCOUNTER — Ambulatory Visit: Payer: Medicare Other | Admitting: Internal Medicine

## 2017-12-13 ENCOUNTER — Inpatient Hospital Stay (HOSPITAL_COMMUNITY): Payer: Medicare Other

## 2017-12-13 DIAGNOSIS — J9621 Acute and chronic respiratory failure with hypoxia: Secondary | ICD-10-CM

## 2017-12-13 DIAGNOSIS — J90 Pleural effusion, not elsewhere classified: Secondary | ICD-10-CM

## 2017-12-13 DIAGNOSIS — I5033 Acute on chronic diastolic (congestive) heart failure: Secondary | ICD-10-CM

## 2017-12-13 LAB — BASIC METABOLIC PANEL
Anion gap: 6 (ref 5–15)
BUN: 28 mg/dL — ABNORMAL HIGH (ref 8–23)
CHLORIDE: 102 mmol/L (ref 98–111)
CO2: 34 mmol/L — ABNORMAL HIGH (ref 22–32)
Calcium: 8.4 mg/dL — ABNORMAL LOW (ref 8.9–10.3)
Creatinine, Ser: 0.96 mg/dL (ref 0.44–1.00)
GFR calc Af Amer: >60 mL/min (ref >60)
GFR, EST NON AFRICAN AMERICAN: 57 mL/min — AB (ref >60)
Glucose, Bld: 101 mg/dL — ABNORMAL HIGH (ref 70–99)
POTASSIUM: 3.9 mmol/L (ref 3.5–5.1)
SODIUM: 142 mmol/L (ref 135–145)

## 2017-12-13 LAB — GLUCOSE, CAPILLARY
GLUCOSE-CAPILLARY: 116 mg/dL — AB (ref 70–99)
GLUCOSE-CAPILLARY: 204 mg/dL — AB (ref 70–99)
GLUCOSE-CAPILLARY: 86 mg/dL (ref 70–99)
Glucose-Capillary: 85 mg/dL (ref 70–99)
Glucose-Capillary: 125 mg/dL — ABNORMAL HIGH (ref 70–99)
Glucose-Capillary: 221 mg/dL — ABNORMAL HIGH (ref 70–99)

## 2017-12-13 LAB — MAGNESIUM: MAGNESIUM: 1.4 mg/dL — AB (ref 1.7–2.4)

## 2017-12-13 MED ORDER — OXYMETAZOLINE HCL 0.05 % NA SOLN
1.0000 | Freq: Two times a day (BID) | NASAL | Status: DC
  Administered 2017-12-13 – 2017-12-15 (×5): 1 via NASAL
  Filled 2017-12-13: qty 15

## 2017-12-13 MED ORDER — PREDNISONE 20 MG PO TABS
40.0000 mg | ORAL_TABLET | Freq: Every day | ORAL | Status: DC
  Administered 2017-12-13 – 2017-12-15 (×3): 40 mg via ORAL
  Filled 2017-12-13 (×4): qty 2

## 2017-12-13 MED ORDER — POTASSIUM CHLORIDE CRYS ER 20 MEQ PO TBCR
20.0000 meq | EXTENDED_RELEASE_TABLET | Freq: Once | ORAL | Status: AC
  Administered 2017-12-13: 20 meq via ORAL
  Filled 2017-12-13: qty 1

## 2017-12-13 MED ORDER — MAGNESIUM SULFATE 2 GM/50ML IV SOLN
2.0000 g | Freq: Once | INTRAVENOUS | Status: AC
  Administered 2017-12-13: 2 g via INTRAVENOUS
  Filled 2017-12-13: qty 50

## 2017-12-13 NOTE — Progress Notes (Addendum)
[name]  BMW:[id number] DOB: 1945-06-17 DOA: 12/09/2017 PCP: [name], MD    LOS: 4 days   Reason for Consult / Chief Complaint:  COPD excerbation   Consulting MD and date:  Dr. Deniece Ree [facility] 12/10/2017  HPI/Summary of hospital stay:  72 year old female former smoker with known history of stage III adenocarcinoma of the right upper lung, severe COPD and chronic hypoxic respiratory failure on home oxygen at 4 L admitted to the hospital on December 09, 2017 for progressive shortness of breath and lower extremity edema, diarrhea and progressive weakness . Patient was felt to have underlying COPD exacerbation and decompensated diastolic heart failure.  She was started on Lasix 40 mg IV twice daily.  A 2D echo is pending.  She had negative troponins and EKG.Cardiology has been consulted.  She was started on azithromycin and systemic steroids.  PCCM was consulted 12/10/17.  Patient is the office patient of [name].  She is followed by oncology Dr. [name] for stage IIIa adenocarcinoma of the right upper lobe..  She has been on immunotherapy but is currently on hold for the last few months.  Most recent CT chest done on November 10, 2017 showed a stable right suprahilar soft tissue density that impacts the right upper lobe airway.  There was slightly improved right upper lobe aeration with some mixed groundglass density, multiple stable pulmonary nodules and a new right lower lobe nodule measuring 6 mm.  She had a stable to slightly increased right pleural effusion and scattered emphysematous changes.  Chest x-ray on admission showed a increased right pleural effusion and right basilar opacity. C Diff panel is pending .  Pt says she is very weak, has no energy . Wears out with minimal activity .  Has been sick for months and feels she is getting weaker each day.     Subjective:  Overall probably better, although she does note her chronic SOB, also her chronic nasal congestion and nasal  obstruction which bothers her significantly.    Objective   Blood pressure (!) 138/51, pulse 79, temperature 98 F (36.7 C), temperature source Oral, resp. rate 19, height 5\' 6"  (1.676 m), weight 76.9 kg, SpO2 98 %.        Intake/Output Summary (Last 24 hours) at 12/13/2017 0904 Last data filed at 12/13/2017 0656 Gross per 24 hour  Intake 843 ml  Output 1201 ml  Net -358 ml   Filed Weights   12/11/17 0251 12/12/17 0432 12/13/17 0609  Weight: 76.4 kg 77.4 kg 76.9 kg    Examination: General: no distress, weak, on 3.5L/min HENT: OP clear, she does have nasal congestion, some difficulty moving air through her nares Lungs: no wheeze, few R sided crackles.  Cardiovascular regular Abdomen: soft, benign Extremities: 1+ edema Neuro: awake, a bit tangential but oriented. Non-focal   Consults: date of consult/date signed off & final recs:  Consult  Date 12/10/17   Procedures: R thora 9/2   Significant Diagnostic Tests: 2 D Echo 8/31>> EF 2725%, grade 1 diastolic dysfxn, PASP 33 mmHg Cytology pleural fluid 9/2 >>   Micro Data: GI panel 8/30 >>  Antimicrobials:  8./31 Azithromycin oral  >>8/31 8/31 Azithromycin IV >> 8/31 Rocephin >>   Resolved Hospital Problem list    Assessment & Plan:  1. Acute COPD Exacerbation +/- PNA (doubt based on clinical response to diuresis) Plan  Continue pulm hygiene, IS, flutter valve Change solumedrol to pred and plan taper:  - Take 40mg   daily for 3 days, then 30mg  daily for 3 days, then 20mg  daily for 3 days, then 10mg  daily for 3 days, then stop Convert BD's back to her home regimen of Trelegy once daily at time of d/c (not formulary while admitted) Cont Nebs as ordered for now Agree with change back to azithro PO, complete 5 days total abx Follow w Dr Lamonte Sakai outpt 10/2  2. Acute on chronic hypoxic Resp Failrue  Plan  Titrate O2, goal SpO2 > 88%  3. Apparent acute on chronic Diastolic CHF  Plan  Clinical improvement with diuresis and  R thoracentesis Note addition metoprolol, lisinopril to regimen Likely will need to be on scheduled lasix at home (was only taking prn)  4. Tachycardia improved. Appeared to be sinus tachy, ? promary proces vs secondary to her pulmonary disease.  Metoprolol as ordered.   5. Diarrhea, improved Plan  GI PCR panel was cancelled  6. Stage III a LUng Cancer  Plan  Follow cytology on R pleural fluid. Suspect that this was due to volume status, not a malignant effusion.  Surveillance CT chest as an outpt a planned.   7. Acute on chronic rhinitis, nasal congestion > always has a significant influence on her WOB Agree with loratadine, saline nasl spray, flonase  8. Goals of care Patient asked me today about whether I believed she had made the right decision to defer CPR, MV. I wholeheartedly believe that she would do poorly with ACLS, MV. Explained this to her and supported her decision to be DNR/I. She understands, but admits that she does sometimes have second thoughts because she does not want to die. After our conversation I believe she understands that we have every intention of continuing to treat any and all medical issues aggressively, but that she would not survive CPR, would not be able to be liberated from MV should she decompensate and require it. We both agreed that it is in her best interests to leave the DNR orders intact.   Disposition / Summary of Today's Plan 12/13/17   72 yo female with known stage IIIa lung cancer, immunotherapy on hold with progressive decline admitted with COPD exacerbation , decompensated CHF . Improved Simplify steroids Transition BD back to Trelegy at d/c Appreciate IR thora > will follow cytology Agree with metoprolol, lisinopril, lasix Code status discussed with her today  PCCM will sign off. Call if we can help  Best Practice / Goals of Care / Disposition.   DVT prophylaxis: SCD  GI prophylaxis: none  Diet: reg  Mobility: as tolerated  Code  Status:  Family Communication: updated pt at bedside 9/1   Labs   CBC: Recent Labs  Lab 12/09/17 1304 12/10/17 0815 12/11/17 0641  WBC 8.2 7.8 7.1  HGB 10.0* 9.9* 9.4*  HCT 32.6* 32.5* 30.0*  MCV 92.6 92.1 90.1  PLT 355 372 768   Basic Metabolic Panel: Recent Labs  Lab 12/09/17 1304 12/10/17 0815 12/11/17 0641 12/12/17 0516 12/13/17 0657  NA 139 140 137 141 142  K 3.8 3.3* 3.3* 3.3* 3.9  CL 100 98 93* 98 102  CO2 28 30 32 32 34*  GLUCOSE 115* 98 191* 122* 101*  BUN 7* 7* 19 21 28*  CREATININE 0.86 0.94 1.06* 0.98 0.96  CALCIUM 8.7* 8.4* 8.0* 8.0* 8.4*  MG  --  1.1*  --   --  1.4*  PHOS  --  3.3  --   --   --    GFR: Estimated Creatinine  Clearance: 54.6 mL/min (by C-G formula based on SCr of 0.96 mg/dL). Recent Labs  Lab 12/09/17 1304 12/10/17 0815 12/11/17 0641  WBC 8.2 7.8 7.1   Liver Function Tests: Recent Labs  Lab 12/10/17 0815 12/11/17 0641  AST 16 13*  ALT 11 10  ALKPHOS 64 62  BILITOT 1.1 0.6  PROT 5.8* 5.4*  ALBUMIN 2.8* 2.6*   No results for input(s): LIPASE, AMYLASE in the last 168 hours. No results for input(s): AMMONIA in the last 168 hours. ABG    Component Value Date/Time   TCO2 31 03/08/2016 1717    Coagulation Profile: No results for input(s): INR, PROTIME in the last 168 hours. Cardiac Enzymes: Recent Labs  Lab 12/09/17 2011 12/10/17 0331 12/10/17 0815  TROPONINI <0.03 <0.03 <0.03   HbA1C: Hgb A1c MFr Bld  Date/Time Value Ref Range Status  12/10/2017 03:31 AM 5.8 (H) 4.8 - 5.6 % Final    Comment:    (NOTE) Pre diabetes:          5.7%-6.4% Diabetes:              >6.4% Glycemic control for   <7.0% adults with diabetes   07/04/2017 04:45 PM 5.9 4.6 - 6.5 % Final    Comment:    Glycemic Control Guidelines for People with Diabetes:Non Diabetic:  <6%Goal of Therapy: <7%Additional Action Suggested:  >8%    CBG: Recent Labs  Lab 12/12/17 1648 12/12/17 1929 12/13/17 0031 12/13/17 0453 12/13/17 0750  GLUCAP 207*  198* 86 116* 85   31 minutes critical care time, chiefly devoted to discussing goals of care and code status with the patient.   Baltazar Apo, MD, PhD 12/13/2017, 9:23 AM [facility] [phone] or if no answer [phone]

## 2017-12-13 NOTE — Care Management Important Message (Signed)
Important Message  Patient Details  Name: [name] MRN: [id number] Date of Birth: [birth year]   Medicare Important Message Given:  Yes    [name] 12/13/2017, 4:42 PM

## 2017-12-13 NOTE — Progress Notes (Signed)
PROGRESS NOTE    PRINCETTA UPLINGER  YNW:[id number] DOB: [birth year] DOA: 12/09/2017 PCP: [name], MD    Brief Narrative:  72 year old female who presented with chest pain and dyspnea. 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 Patient reported not feeling well for few months, including upper and lower respiratory tract infections treated with antibiotics, with mild improvement of her symptoms,but thenshe hadrecurrent symptoms with significant dyspnea on exertion, associated with diarrhea. On her initial physical examination blood pressure was 145/52, heart rate 77, respiratory rate22, temperature 99.3, oxygen saturation 100% on supplemental oxygen. Her lungs had no wheezing, no rales or rhonchi, abdomen soft nontender,positive lower extremity edema. Sodium 139, potassium 3.9, chloride 100, bicarb 28, glucose 115, BUN 7, creatinine 0.86, white count 8.2, hemoglobin 10.0, hematocrit 32.6, platelets 355.Chest x-ray with a right upper lobe infiltrate, new right-sided effusion.EKG normal sinus rhythm.  Patient was admitted to the hospital with a working diagnosis acute on chronic decompensation of diastolic heart failure.   Assessment & Plan:   Active Problems:   Essential hypertension   COPD (chronic obstructive pulmonary disease) (HCC)   Respiratory failure with hypoxia (HCC)   Primary cancer of right upper lobe of lung (HCC)   Anemia   Restless legs syndrome (RLS)   GERD (gastroesophageal reflux disease)   DM type 2 with diabetic peripheral neuropathy (HCC)   Anxiety and depression   CAD (coronary artery disease)   Inappropriate sinus tachycardia   Acute on chronic diastolic CHF (congestive heart failure) (HCC)   Pleural  effusion   Chest pain   1. Acute on chronic diastolic heart failure exacerbation. Urine output over last 24 hours 1,501 ml. Responding well to diuresis with furosemide. Heart failure management with lisinopril and metoprolol. Improved symptoms but not back to baseline.   2. COPDexacerbation with associated sinustis, complicated with acute on chronic hypoxic respiratory failure. Continue paranasal sinus regimen with flonase and saline nasal spry, will add afrin bid.  On  bronchodilator therapy with levalbuterol and ipratropium plus inhaled corticosteroids. Further taper steroids. Oxymetry this am 100% on 4 LPM oxygen per nasal cannula.   3. T2DM with steroid induced hyperglycemia. Glucose cover and monitoring with insulin sliding scale, capillary glucose 198, 86, 116, 85, 125. Tolerating po well.   4. HTN. Continue with lisinopril and metoprolol, for blood pressure control.  5.Right upper lobe lung adenocarcinoma, complicated with right exudate pleural effusion. SpUS guided thoracentesis, removed 1.4 L. Improved oxygenation and dyspnea, follow up chest radiograph with improvement of right effusion. Will need close follow up as outpatient, follow on cytology. Follows up with [name], last visit on 10/2017, with treatment with immunotherapy Nivolumab  6. Anxiety. Continue with alprazolam. Tolerating well.    7. Diuretic induced hypokalemia. Serum K at 3,9 with Mg at 1,4, will correct Mg with Mag sulfate, follow renal panel in am. For today will give 20 meq Kcl. Renal function continue to be preserved with serum cr at 0.96.   DVT prophylaxis:enoxaparin Code Status:dnr Family Communication:no family at the bedside Disposition Plan/ discharge barriers:pending clinical improvement.   Consultants:    Procedures:    Antimicrobials:  Azithromycin.   Subjective: Dyspnea continue to improve but still not back to baseline, patient is very concerned about  returning home today. No nausea or vomiting, no chest  pain. Feeling very weak and deconditioned.   Objective: Vitals:   12/12/17 2126 12/13/17 0120 12/13/17 0609 12/13/17 0728  BP:   (!) 138/51   Pulse: 88  79   Resp:   19   Temp:   98 F (36.7 C)   TempSrc:   Oral   SpO2:  99% 98% 98%  Weight:   76.9 kg   Height:        Intake/Output Summary (Last 24 hours) at 12/13/2017 1014 Last data filed at 12/13/2017 0656 Gross per 24 hour  Intake 843 ml  Output 1201 ml  Net -358 ml   Filed Weights   12/11/17 0251 12/12/17 0432 12/13/17 0609  Weight: 76.4 kg 77.4 kg 76.9 kg    Examination:   General: deconditioned and ill looking appearing Neurology: Awake and alert, non focal  E ENT: mild pallor, no icterus, oral mucosa moist Cardiovascular: No JVD. S1-S2 present, rhythmic, no gallops, rubs, or murmurs. No lower extremity edema. Pulmonary: positive breath sounds bilaterally, decreased air movement, no wheezing, rhonchi, but scattered rales. Gastrointestinal. Abdomen with no organomegaly, non tender, no rebound or guarding Skin. No rashes Musculoskeletal: no joint deformities     Data Reviewed: I have personally reviewed following labs and imaging studies  CBC: Recent Labs  Lab 12/09/17 1304 12/10/17 0815 12/11/17 0641  WBC 8.2 7.8 7.1  HGB 10.0* 9.9* 9.4*  HCT 32.6* 32.5* 30.0*  MCV 92.6 92.1 90.1  PLT 355 372 676   Basic Metabolic Panel: Recent Labs  Lab 12/09/17 1304 12/10/17 0815 12/11/17 0641 12/12/17 0516 12/13/17 0657  NA 139 140 137 141 142  K 3.8 3.3* 3.3* 3.3* 3.9  CL 100 98 93* 98 102  CO2 28 30 32 32 34*  GLUCOSE 115* 98 191* 122* 101*  BUN 7* 7* 19 21 28*  CREATININE 0.86 0.94 1.06* 0.98 0.96  CALCIUM 8.7* 8.4* 8.0* 8.0* 8.4*  MG  --  1.1*  --   --  1.4*  PHOS  --  3.3  --   --   --    GFR: Estimated Creatinine Clearance: 54.6 mL/min (by C-G formula based on SCr of 0.96 mg/dL). Liver Function Tests: Recent Labs  Lab 12/10/17 0815  12/11/17 0641  AST 16 13*  ALT 11 10  ALKPHOS 64 62  BILITOT 1.1 0.6  PROT 5.8* 5.4*  ALBUMIN 2.8* 2.6*   No results for input(s): LIPASE, AMYLASE in the last 168 hours. No results for input(s): AMMONIA in the last 168 hours. Coagulation Profile: No results for input(s): INR, PROTIME in the last 168 hours. Cardiac Enzymes: Recent Labs  Lab 12/09/17 2011 12/10/17 0331 12/10/17 0815  TROPONINI <0.03 <0.03 <0.03   BNP (last 3 results) No results for input(s): PROBNP in the last 8760 hours. HbA1C: No results for input(s): HGBA1C in the last 72 hours. CBG: Recent Labs  Lab 12/12/17 1648 12/12/17 1929 12/13/17 0031 12/13/17 0453 12/13/17 0750  GLUCAP 207* 198* 86 116* 85   Lipid Profile: No results for input(s): CHOL, HDL, LDLCALC, TRIG, CHOLHDL, LDLDIRECT in the last 72 hours. Thyroid Function Tests: No results for input(s): TSH, T4TOTAL, FREET4, T3FREE, THYROIDAB in the last 72 hours. Anemia Panel: No results for input(s): VITAMINB12, FOLATE, FERRITIN, TIBC, IRON, RETICCTPCT in the last 72 hours.    Radiology Studies: I have reviewed all of the imaging during this hospital visit personally     Scheduled Meds: . aspirin EC  81 mg Oral Daily  . azithromycin  500  mg Oral Daily  . budesonide (PULMICORT) nebulizer solution  0.25 mg Nebulization BID  . fluticasone  2 spray Each Nare Daily  . furosemide  40 mg Oral Daily  . guaiFENesin  600 mg Oral BID  . insulin aspart  0-9 Units Subcutaneous Q4H  . ipratropium  0.5 mg Nebulization Q6H  . levalbuterol  0.63 mg Nebulization Q6H  . lisinopril  2.5 mg Oral Daily  . loratadine  10 mg Oral Daily  . metoprolol tartrate  25 mg Oral BID  . predniSONE  40 mg Oral Q breakfast  . sodium chloride  1 spray Each Nare TID  . sodium chloride flush  3 mL Intravenous Q12H   Continuous Infusions: . sodium chloride       LOS: 4 days        Monish Haliburton Gerome Apley, MD [facility] Pager [phone]

## 2017-12-13 NOTE — Progress Notes (Signed)
PT Cancellation Note  Patient Details Name: [name] MRN: [id number] DOB: 1946/01/11   Cancelled Treatment:    Reason Eval/Treat Not Completed: Patient declined, no reason specified Patient politely declines PT this afternoon due to general fatigue and malaise, not feeling well at all this afternoon. She was left in bed with all needs met.    Deniece Ree PT, DPT, CBIS  Supplemental Physical Therapist [facility]   Pager [phone]

## 2017-12-13 NOTE — Telephone Encounter (Signed)
Called [name], unable to reach left message to give [place] a call back.

## 2017-12-13 NOTE — Progress Notes (Addendum)
Occupational Therapy Treatment Patient Details Name: [name] MRN: [id number] DOB: 05-05-1945 Today's Date: 12/13/2017    History of present illness Pt is a 72 y.o. female admitted 12/09/17 with SOB, BLE edema and fatigue. CHF exacerbation and pleural effusion planned thoracentesis 9/2. 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   OT comments  Pt progressing towards established OT goals. Pt performing grooming at sink and toileting with supervision for safety. Pt performing hallway distance functional mobility with RW and Min Guard A. Pt Spo2 in 90s on 4L throughout activity. Pt requiring Max A for doffing socks due to back pain and would benefit from education on AE for LB ADLs.  Continue to recommend dc home once medically stable and will continue to follow acutely as admitted.    Follow Up Recommendations  No OT follow up;Supervision/Assistance - 24 hour    Equipment Recommendations  None recommended by OT(Pt has appropriate DME)    Recommendations for Other Services      Precautions / Restrictions Precautions Precautions: Fall Restrictions Weight Bearing Restrictions: No       Mobility Bed Mobility   Bed Mobility: Supine to Sit;Sit to Supine     Supine to sit: Supervision Sit to supine: Supervision   General bed mobility comments: supervision for safety. increased time   Transfers Overall transfer level: Needs assistance Equipment used: Rolling walker (2 wheeled) Transfers: Sit to/from Stand Sit to Stand: Supervision         General transfer comment: supervision for safety    Balance Overall balance assessment: Needs assistance   Sitting balance-[name] Scale: Good       Standing balance-[name] Scale: Fair Standing balance comment: able to maintain standing at sink without UE support                           ADL either performed or assessed with clinical judgement   ADL  Overall ADL's : Needs assistance/impaired     Grooming: Standing;Set up;Supervision/safety;Oral care;Wash/dry face;Brushing hair Grooming Details (indicate cue type and reason): supervision for safety         Upper Body Dressing : Supervision/safety;Standing Upper Body Dressing Details (indicate cue type and reason): donning/doffing second gown like jacket while standing Lower Body Dressing: Sit to/from stand;Maximal assistance Lower Body Dressing Details (indicate cue type and reason): Max A for doffing socks due to back/bottom pain. Pt would benefit form AE education Toilet Transfer: Supervision/safety;Set up;RW;Ambulation;Regular Glass blower/designer Details (indicate cue type and reason): supervision for safety Toileting- Clothing Manipulation and Hygiene: Supervision/safety;Sitting/lateral lean       Functional mobility during ADLs: Min guard;Rolling walker General ADL Comments: Pt performing grooming, toileting, and hallway distance functional mobility with supervision-Min Guard A for safety. Pt SpO2 in 90s on 4L throughout activity.     Vision   Vision Assessment?: No apparent visual deficits   Perception     Praxis      Cognition Arousal/Alertness: Awake/alert Behavior During Therapy: WFL for tasks assessed/performed Overall Cognitive Status: Within Functional Limits for tasks assessed                                 General Comments: Pt benefiting from increased time for processing. Feel pt is near baseline cognition        Exercises     Shoulder Instructions  General Comments Spo2 staying in 90s on 4L    Pertinent Vitals/ Pain       Pain Assessment: No/denies pain  Home Living                                          Prior Functioning/Environment              Frequency  Min 2X/week        Progress Toward Goals  OT Goals(current goals can now be found in the care plan section)  Progress towards OT  goals: Progressing toward goals  Acute Rehab OT Goals Patient Stated Goal: None stated ADL Goals Pt Will Transfer to Toilet: with modified independence;ambulating Pt Will Perform Toileting - Clothing Manipulation and hygiene: sit to/from stand;with modified independence Pt Will Perform Tub/Shower Transfer: Tub transfer;ambulating;shower seat Additional ADL Goal #1: Pt will recall 3 ways of conserving energy during ADL/IADL at indpendent level  Plan Discharge plan remains appropriate    Co-evaluation                 AM-PAC PT "6 Clicks" Daily Activity     Outcome Measure   Help from another person eating meals?: None Help from another person taking care of personal grooming?: A Little Help from another person toileting, which includes using toliet, bedpan, or urinal?: A Little Help from another person bathing (including washing, rinsing, drying)?: A Little Help from another person to put on and taking off regular upper body clothing?: A Little Help from another person to put on and taking off regular lower body clothing?: A Little 6 Click Score: 19    End of Session Equipment Utilized During Treatment: Rolling walker;Oxygen  OT Visit Diagnosis: Unsteadiness on feet (R26.81);Other abnormalities of gait and mobility (R26.89);Muscle weakness (generalized) (M62.81);Other symptoms and signs involving cognitive function   Activity Tolerance Patient tolerated treatment well   Patient Left with call bell/phone within reach;in bed(with MD)   Nurse Communication Mobility status        Time: 6967-8938 OT Time Calculation (min): 21 min  Charges: OT General Charges $OT Visit: 1 Visit OT Treatments $Self Care/Home Management : 8-22 mins  [place], OTR/L Acute Rehab Pager: [phone] Office: [place] 12/13/2017, 11:19 AM

## 2017-12-14 ENCOUNTER — Inpatient Hospital Stay (HOSPITAL_COMMUNITY): Payer: Medicare Other

## 2017-12-14 ENCOUNTER — Telehealth: Payer: Self-pay | Admitting: Emergency Medicine

## 2017-12-14 DIAGNOSIS — M79609 Pain in unspecified limb: Secondary | ICD-10-CM

## 2017-12-14 DIAGNOSIS — I10 Essential (primary) hypertension: Secondary | ICD-10-CM

## 2017-12-14 DIAGNOSIS — R600 Localized edema: Secondary | ICD-10-CM

## 2017-12-14 LAB — GLUCOSE, CAPILLARY
Glucose-Capillary: 129 mg/dL — ABNORMAL HIGH (ref 70–99)
Glucose-Capillary: 141 mg/dL — ABNORMAL HIGH (ref 70–99)
Glucose-Capillary: 169 mg/dL — ABNORMAL HIGH (ref 70–99)
Glucose-Capillary: 220 mg/dL — ABNORMAL HIGH (ref 70–99)
Glucose-Capillary: 91 mg/dL (ref 70–99)
Glucose-Capillary: 93 mg/dL (ref 70–99)
Glucose-Capillary: 98 mg/dL (ref 70–99)

## 2017-12-14 LAB — BASIC METABOLIC PANEL
Anion gap: 7 (ref 5–15)
BUN: 21 mg/dL (ref 8–23)
CALCIUM: 8.4 mg/dL — AB (ref 8.9–10.3)
CO2: 32 mmol/L (ref 22–32)
CREATININE: 0.87 mg/dL (ref 0.44–1.00)
Chloride: 102 mmol/L (ref 98–111)
GFR calc Af Amer: >60 mL/min (ref >60)
Glucose, Bld: 99 mg/dL (ref 70–99)
Potassium: 4.4 mmol/L (ref 3.5–5.1)
SODIUM: 141 mmol/L (ref 135–145)

## 2017-12-14 LAB — PH, BODY FLUID: pH, Body Fluid: 7.8

## 2017-12-14 LAB — MAGNESIUM: Magnesium: 1.9 mg/dL (ref 1.7–2.4)

## 2017-12-14 MED ORDER — FUROSEMIDE 10 MG/ML IJ SOLN
40.0000 mg | Freq: Two times a day (BID) | INTRAMUSCULAR | Status: DC
  Administered 2017-12-14 – 2017-12-15 (×2): 40 mg via INTRAVENOUS
  Filled 2017-12-14 (×2): qty 4

## 2017-12-14 NOTE — Progress Notes (Signed)
Bilateral lower extremity venous duplex has been completed. Negative for DVT.  12/14/17 3:00 PM [name] RVT

## 2017-12-14 NOTE — Progress Notes (Signed)
Patient stated that after working with PT her legs are more swollen and  Hurting. She is concerned with going home. Paged MD Hongalgi and made him aware.  Janaiah Vetrano, RN

## 2017-12-14 NOTE — Progress Notes (Signed)
Per MD will start Lasix IV and do VAS [place] Lower Extremity. Patient is to stay one more night. Patient and son informed and agreeable with plan.  Lacosta Hargan, RN

## 2017-12-14 NOTE — Progress Notes (Signed)
PROGRESS NOTE   [name]  WUJ:[id number]    DOB: Feb 16, 1946    DOA: 12/09/2017  PCP: [name], MD   I have briefly reviewed patients previous medical records in [facility].  Brief Narrative:  72 year old female with PMH of right upper lobe lung cancer status post radiation and chemotherapy, anemia, A. fib, COPD, chronic respiratory failure on home oxygen 4 L/min, CAD, anxiety, depression, DVT, GERD, HLD, HTN, DM 2 presented to ED on 8/30 due to progressive dyspnea, prolonged history of URI symptoms unrelieved with outpatient courses of steroids and antibiotics, had persistent tachycardia ongoing for 2 months, weight gain after holding Lasix, diarrhea and persistent leg swelling.  She was admitted for worsening pleural effusion, lower extremity edema due to acute on chronic diastolic CHF.   Assessment & Plan:   Active Problems:   Essential hypertension   COPD (chronic obstructive pulmonary disease) (HCC)   Respiratory failure with hypoxia (HCC)   Primary cancer of right upper lobe of lung (HCC)   Anemia   Restless legs syndrome (RLS)   GERD (gastroesophageal reflux disease)   DM type 2 with diabetic peripheral neuropathy (HCC)   Anxiety and depression   CAD (coronary artery disease)   Inappropriate sinus tachycardia   Acute on chronic diastolic CHF (congestive heart failure) (HCC)   Pleural effusion   Chest pain   Acute on chronic diastolic CHF: Initially treated with IV Lasix and then subsequently transitioned to oral Lasix 40 mg daily.  However patient has significant and painful bilateral lower extremity edema.  TTE 9/1: LVEF 60-65% and grade 1 diastolic dysfunction.  Bilateral lower extremity venous Dopplers negative for DVT.  Change Lasix back to IV 40 mg every 12 hours on 9/4.  Strict intake output and daily weights.  -4.1 L thus far.  Still appears significantly volume overloaded.  Continue lisinopril and metoprolol.  Bilateral lower extremity edema: Likely  due to decompensated CHF, hypoalbuminemia.  Lower extremity venous Dopplers 9/4- for DVT.  Continue IV Lasix.  COPD exacerbation: Improved.  Continue budesonide, fluticasone, bronchodilator nebulizations and prednisone taper.  Pulmonology had consulted and signed off 9/3.  Patient has follow-up appointment with [name] on 10/2.  Acute on chronic hypoxic respiratory failure: Likely related to decompensated CHF and COPD exacerbation.  Improved.  Type II DM with hyperglycemia: Reasonable inpatient control.  Continue SSI.  Essential hypertension: Controlled.  Continue lisinopril and metoprolol.  Right upper lobe lung cancer stage IIIa/mass complicated by exudative right pleural effusion: Status post ultrasound-guided thoracentesis 1.4 L.  Pleural fluid cytology 9/2: No malignant cells identified.  Reactive mesothelial cells present..  Outpatient follow-up with pulmonology.  Outpatient follow-up with oncology.  Improved.  Anxiety: Stable.  Hypokalemia and hypomagnesemia: Replaced.  Anemia of chronic disease: Stable.  Tachycardia: Resolved.  Likely multifactorial due to acute pulmonary issues and decompensated CHF.  Continue metoprolol.  Diarrhea: Improved.  Adult failure to thrive: Multifactorial as above.  DNR now.     DVT prophylaxis: Lovenox Code Status: DNR Family Communication: None at bedside Disposition: DC home pending clinical improvement, reassess in a.m.   Consultants:  Pulmonology. IR  Procedures:  Thoracentesis  Antimicrobials:  None   Subjective: Patient reports dyspnea has improved but breathing is not yet at baseline.  Reports chronic dyspnea since lung cancer diagnosis.  Indicates worsening bilateral lower extremity swelling and pain after PT today.  Diffuse leg pain.  ROS: As above, otherwise negative.  Objective:  Vitals:   12/14/17 0938 12/14/17 1143  12/14/17 1334 12/14/17 1750  BP: (!) 121/52 (!) 147/53  (!) 152/54  Pulse: 78 69  75  Resp:  20     Temp:  98.2 F (36.8 C)    TempSrc:  Oral    SpO2: 98% 99% 98% 98%  Weight:      Height:        Examination:  General exam: Pleasant elderly female, moderately built and nourished, sitting up comfortably in chair this morning. Respiratory system: Slightly diminished breath sounds in the bases but otherwise clear to auscultation. Respiratory effort normal. Cardiovascular system: S1 & S2 heard, RRR. No JVD, murmurs, rubs, gallops or clicks.  2+ pitting bilateral leg edema.  Telemetry personally reviewed: Sinus rhythm. Gastrointestinal system: Abdomen is nondistended, soft and nontender. No organomegaly or masses felt. Normal bowel sounds heard. Central nervous system: Alert and oriented. No focal neurological deficits. Extremities: Symmetric 5 x 5 power. Skin: No rashes, lesions or ulcers Psychiatry: Judgement and insight appear impaired. Mood & affect appropriate.     Data Reviewed: I have personally reviewed following labs and imaging studies  CBC: Recent Labs  Lab 12/09/17 1304 12/10/17 0815 12/11/17 0641  WBC 8.2 7.8 7.1  HGB 10.0* 9.9* 9.4*  HCT 32.6* 32.5* 30.0*  MCV 92.6 92.1 90.1  PLT 355 372 628   Basic Metabolic Panel: Recent Labs  Lab 12/10/17 0815 12/11/17 0641 12/12/17 0516 12/13/17 0657 12/14/17 0436  NA 140 137 141 142 141  K 3.3* 3.3* 3.3* 3.9 4.4  CL 98 93* 98 102 102  CO2 30 32 32 34* 32  GLUCOSE 98 191* 122* 101* 99  BUN 7* 19 21 28* 21  CREATININE 0.94 1.06* 0.98 0.96 0.87  CALCIUM 8.4* 8.0* 8.0* 8.4* 8.4*  MG 1.1*  --   --  1.4* 1.9  PHOS 3.3  --   --   --   --    Liver Function Tests: Recent Labs  Lab 12/10/17 0815 12/11/17 0641  AST 16 13*  ALT 11 10  ALKPHOS 64 62  BILITOT 1.1 0.6  PROT 5.8* 5.4*  ALBUMIN 2.8* 2.6*   Coagulation Profile: No results for input(s): INR, PROTIME in the last 168 hours. Cardiac Enzymes: Recent Labs  Lab 12/09/17 2011 12/10/17 0331 12/10/17 0815  TROPONINI <0.03 <0.03 <0.03   HbA1C: No  results for input(s): HGBA1C in the last 72 hours. CBG: Recent Labs  Lab 12/14/17 0043 12/14/17 0346 12/14/17 0811 12/14/17 1141 12/14/17 1640  GLUCAP 93 98 91 169* 141*    Recent Results (from the past 240 hour(s))  Culture, body fluid-bottle     Status: None (Preliminary result)   Collection Time: 12/12/17 10:29 AM  Result Value Ref Range Status   Specimen Description PLEURAL FLUID  Final   Special Requests NONE  Final   Culture   Final    NO GROWTH 2 DAYS Performed at [facility], [place] [address] [place], [place] [address]    Report Status PENDING  Incomplete  Gram stain     Status: None   Collection Time: 12/12/17 10:29 AM  Result Value Ref Range Status   Specimen Description PLEURAL FLUID  Final   Special Requests NONE  Final   Gram Stain   Final    ABUNDANT WBC PRESENT, PREDOMINANTLY MONONUCLEAR NO ORGANISMS SEEN Performed at [facility], [address] [place], [place] [address]    Report Status 12/12/2017 FINAL  Final         Radiology  Studies: Dg Chest 2 View  Result Date: 12/13/2017 CLINICAL DATA:  Lung carcinoma with shortness of breath EXAM: CHEST - 2 VIEW COMPARISON:  December 12, 2017 chest radiograph; chest CT November 10, 2017 FINDINGS: The previous consolidation with probable mass involving the axillary subsegment of the right upper lobe is again noted, similar to 1 day prior. There is postoperative change on the left, stable. No new opacity is evident. Heart size is normal. There is distortion of pulmonary vascularity on the right, stable. There is concern for adenopathy in the right perihilar region, stable. There are no blastic or lytic bone lesions. Patient is status post kyphoplasty in the upper thoracic region. There is an adjacent wedge compression fracture in the upper thoracic region, stable. No pneumothorax. IMPRESSION: Similar appearance compared to 1 day prior. Apparent mass with consolidation on the right in the axillary segment  right upper lobe region. Areas of volume loss bilaterally noted. Questionable right hilar adenopathy. Stable cardiac silhouette. No demonstrable pneumothorax. Electronically Signed   By: Lowella Grip III M.D.   On: 12/13/2017 08:15        Scheduled Meds: . aspirin EC  81 mg Oral Daily  . azithromycin  500 mg Oral Daily  . budesonide (PULMICORT) nebulizer solution  0.25 mg Nebulization BID  . fluticasone  2 spray Each Nare Daily  . furosemide  40 mg Intravenous BID  . guaiFENesin  600 mg Oral BID  . insulin aspart  0-9 Units Subcutaneous Q4H  . ipratropium  0.5 mg Nebulization Q6H  . levalbuterol  0.63 mg Nebulization Q6H  . lisinopril  2.5 mg Oral Daily  . loratadine  10 mg Oral Daily  . metoprolol tartrate  25 mg Oral BID  . oxymetazoline  1 spray Each Nare BID  . predniSONE  40 mg Oral Q breakfast  . sodium chloride  1 spray Each Nare TID  . sodium chloride flush  3 mL Intravenous Q12H   Continuous Infusions: . sodium chloride 250 mL (12/13/17 1159)     LOS: 5 days     Vernell Leep, MD, FACP, [facility]. [facility] Pager [phone]  If 7PM-7AM, please contact night-coverage [email or web address] Password [facility] 12/14/2017, 6:47 PM

## 2017-12-14 NOTE — Telephone Encounter (Signed)
Called and spoke to pt's case worker, Hassan Rowan.  Hassan Rowan is requesting RB's recommendations for PCP, as pt wishes to switch from Dr. Jenny Reichmann.  RB please advise. Thanks

## 2017-12-14 NOTE — Psychosocial Assessment (Signed)
If patient is to be discharged home tomorrow please call the son(Phillips Michael) 40 minutes before discharging. He is going to come and pick up the patient.

## 2017-12-14 NOTE — Progress Notes (Signed)
Physical Therapy Treatment Patient Details Name: [name] MRN: [id number] DOB: Jun 11, 1945 Today's Date: 12/14/2017    History of Present Illness Pt is a 72 y.o. female admitted 12/09/17 with SOB, BLE edema and fatigue. CHF exacerbation and pleural effusion planned thoracentesis 9/2. 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   PT Comments    Pt progressing with mobility. Today's session focused on ambulation and ADL task, pt able to complete with RW at supervision-level; assist for LB bathing. Required intermittent seated rest break during standing tasks secondary to fatigue and DOE. SpO2 >90% on 4L O2 [name]. Pt hopeful to return home today but remains concerned about BLE swelling. Will follow acutely.   Follow Up Recommendations  Home health PT;Supervision for mobility/OOB     Equipment Recommendations  None recommended by PT    Recommendations for Other Services       Precautions / Restrictions Precautions Precautions: Fall Restrictions Weight Bearing Restrictions: No    Mobility  Bed Mobility               General bed mobility comments: Received sitting in recliner  Transfers Overall transfer level: Needs assistance Equipment used: Rolling walker (2 wheeled) Transfers: Sit to/from Stand Sit to Stand: Supervision         General transfer comment: Stood multiple times from sitting at sink and toilet with RW and supervision  Ambulation/Gait Ambulation/Gait assistance: Supervision Gait Distance (Feet): 40 Feet Assistive device: Rolling walker (2 wheeled) Gait Pattern/deviations: Step-through pattern;Decreased stride length;Trunk flexed Gait velocity: Decreased Gait velocity interpretation: 1.31 - 2.62 ft/sec, indicative of limited community ambulator General Gait Details: DOE 2/4; SpO2 >90% on 4L O2 [name]   Stairs             Wheelchair Mobility    Modified Rankin (Stroke Patients  Only)       Balance Overall balance assessment: Needs assistance   Sitting balance-[name] Scale: Good       Standing balance-[name] Scale: Fair Standing balance comment: Able to stand at sink for upper body bathing without UE support                            Cognition Arousal/Alertness: Awake/alert Behavior During Therapy: WFL for tasks assessed/performed Overall Cognitive Status: History of cognitive impairments - at baseline Area of Impairment: Memory;Attention                   Current Attention Level: Selective Memory: Decreased short-term memory         General Comments: Likely baseline cognition       Exercises      General Comments        Pertinent Vitals/Pain Pain Assessment: No/denies pain    Home Living                      Prior Function            PT Goals (current goals can now be found in the care plan section) Acute Rehab PT Goals Patient Stated Goal: Return home today with less feet swelling PT Goal Formulation: With patient Time For Goal Achievement: 12/24/17 Progress towards PT goals: Progressing toward goals    Frequency    Min 3X/week      PT Plan Current plan remains appropriate    Co-evaluation  AM-PAC PT "6 Clicks" Daily Activity  Outcome Measure  Difficulty turning over in bed (including adjusting bedclothes, sheets and blankets)?: None Difficulty moving from lying on back to sitting on the side of the bed? : None Difficulty sitting down on and standing up from a chair with arms (e.g., wheelchair, bedside commode, etc,.)?: A Little Help needed moving to and from a bed to chair (including a wheelchair)?: A Little Help needed walking in hospital room?: A Little Help needed climbing 3-5 steps with a railing? : A Little 6 Click Score: 20    End of Session Equipment Utilized During Treatment: Gait belt;Oxygen Activity Tolerance: Patient tolerated treatment well Patient left:  in bed;with bed alarm set;with family/visitor present;with call bell/phone within reach Nurse Communication: Mobility status PT Visit Diagnosis: Other abnormalities of gait and mobility (R26.89)     Time: 4171-2787 PT Time Calculation (min) (ACUTE ONLY): 35 min  Charges:  $Gait Training: 8-22 mins $Therapeutic Activity: 8-22 mins                     Mabeline Caras, PT, DPT Acute Rehabilitation Services  Pager [phone] Office [place] 12/14/2017, 10:49 AM

## 2017-12-14 NOTE — Progress Notes (Signed)
Occupational Therapy Treatment Patient Details Name: FLONNIE WIERMAN MRN: [id number] DOB: [birth year] Today's Date: 12/14/2017    History of present illness Pt is a 72 y.o. female admitted 12/09/17 with SOB, BLE edema and fatigue. CHF exacerbation and pleural effusion planned thoracentesis 9/2. 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   OT comments  Pt progressing towards established OT goals. Providing pt with education and handout on AE for LB ADLs to increase independence and energy conservation as well as decreased pain. Pt performing LB dressing with AE, toileting, hand hygiene, and functional mobility with supervision. SpO2 90s on 4L throughout session. Continue to recommend dc home and will continue to follow acutely as admitted to facilitate safe dc.    Follow Up Recommendations  No OT follow up;Supervision/Assistance - 24 hour    Equipment Recommendations  None recommended by OT(Pt has appropriate DME)    Recommendations for Other Services      Precautions / Restrictions Precautions Precautions: Fall Restrictions Weight Bearing Restrictions: No       Mobility Bed Mobility Overal bed mobility: Needs Assistance Bed Mobility: Supine to Sit     Supine to sit: Supervision     General bed mobility comments: supervision for safety  Transfers Overall transfer level: Needs assistance Equipment used: Rolling walker (2 wheeled) Transfers: Sit to/from Stand Sit to Stand: Supervision         General transfer comment: supervision for safety    Balance Overall balance assessment: Needs assistance   Sitting balance-[name] Scale: Good       Standing balance-[name] Scale: Fair Standing balance comment: Able to stand at sink for upper body bathing without UE support                           ADL either performed or assessed with clinical judgement   ADL Overall ADL's : Needs  assistance/impaired     Grooming: Supervision/safety;Standing;Wash/dry hands         Lower Body Bathing Details (indicate cue type and reason): Educating pt on AE for LB bathing to increased independence.     Lower Body Dressing: Sit to/from stand;Min guard;With adaptive equipment Lower Body Dressing Details (indicate cue type and reason): Educating pt on AE for LB dressing. Pt donning socks with AE and Min Guard A fro Office manager: Supervision/safety;Set up;RW;Ambulation;Regular Glass blower/designer Details (indicate cue type and reason): supervision for safety Toileting- Clothing Manipulation and Hygiene: Supervision/safety;Sitting/lateral lean Toileting - Clothing Manipulation Details (indicate cue type and reason): able to manage peri care and underwear     Functional mobility during ADLs: Rolling walker;Supervision/safety General ADL Comments: Pt performing LB dressing, toileting, and functional mobility with supervision. Providing education and handout on AE for LB ADLs.      Vision   Vision Assessment?: No apparent visual deficits   Perception     Praxis      Cognition Arousal/Alertness: Awake/alert Behavior During Therapy: WFL for tasks assessed/performed Overall Cognitive Status: History of cognitive impairments - at baseline Area of Impairment: Memory;Attention                 Orientation Level: (thought that Clinton was president) Current Attention Level: Selective Memory: Decreased short-term memory       Problem Solving: Slow processing;Requires verbal cues General Comments: Feel pt is at baseline cognition        Exercises     Shoulder Instructions  General Comments SpO2 in 90s throughout session on 4L    Pertinent Vitals/ Pain       Pain Assessment: No/denies pain  Home Living                                          Prior Functioning/Environment              Frequency  Min 2X/week         Progress Toward Goals  OT Goals(current goals can now be found in the care plan section)  Progress towards OT goals: Progressing toward goals  Acute Rehab OT Goals Patient Stated Goal: Return home today with less feet swelling ADL Goals Pt Will Transfer to Toilet: with modified independence;ambulating Pt Will Perform Toileting - Clothing Manipulation and hygiene: sit to/from stand;with modified independence Pt Will Perform Tub/Shower Transfer: Tub transfer;ambulating;shower seat Additional ADL Goal #1: Pt will recall 3 ways of conserving energy during ADL/IADL at indpendent level  Plan Discharge plan remains appropriate    Co-evaluation                 AM-PAC PT "6 Clicks" Daily Activity     Outcome Measure   Help from another person eating meals?: None Help from another person taking care of personal grooming?: A Little Help from another person toileting, which includes using toliet, bedpan, or urinal?: A Little Help from another person bathing (including washing, rinsing, drying)?: A Little Help from another person to put on and taking off regular upper body clothing?: A Little Help from another person to put on and taking off regular lower body clothing?: A Little 6 Click Score: 19    End of Session Equipment Utilized During Treatment: Rolling walker;Oxygen  OT Visit Diagnosis: Unsteadiness on feet (R26.81);Other abnormalities of gait and mobility (R26.89);Muscle weakness (generalized) (M62.81);Other symptoms and signs involving cognitive function   Activity Tolerance Patient tolerated treatment well   Patient Left with call bell/phone within reach;in chair;with nursing/sitter in room(with MD)   Nurse Communication Mobility status        Time: 5638-7564 OT Time Calculation (min): 20 min  Charges: OT General Charges $OT Visit: 1 Visit OT Treatments $Self Care/Home Management : 8-22 mins  [place], OTR/L Acute Rehab Pager:  [phone] Office: [place] 12/14/2017, 11:59 AM

## 2017-12-14 NOTE — Progress Notes (Signed)
Pain medications administered and effective. Lower legs elevated.  Marland Kitchen

## 2017-12-14 NOTE — Care Management Note (Signed)
Case Management Note  Patient Details  Name: KATASHA RIGA MRN: [id number] Date of Birth: 1945/06/22  Subjective/Objective:    Anemia               Action/Plan: Patient lives at home with daughter; PCP is Dr Cathlean Cower but patient does not want to return to his office and is requesting another PCP that is older and more understanding; CM called Dr Lamonte Sakai, (patient's Pulmonologist ) nurse for recommendation for a new PCP; has private insurance with Medicare/ Medicaid with prescription drug coverage; pharmacy of choice is [facility]. DME - home oxygen through [place], nebulizer machine, walker; she is refusing all [facility] services at this time; she has a personal care service / nurses aide that provides 79 hrs a month assistance.  Expected Discharge Date:     Possibly 12/14/2017             Expected Discharge Plan:   Home   HH Arranged:   Refused  Status of Service:   In progress  Sherrilyn Rist [phone] 12/14/2017, 11:34 AM

## 2017-12-15 DIAGNOSIS — D649 Anemia, unspecified: Secondary | ICD-10-CM

## 2017-12-15 DIAGNOSIS — R000 Tachycardia, unspecified: Secondary | ICD-10-CM

## 2017-12-15 DIAGNOSIS — E1142 Type 2 diabetes mellitus with diabetic polyneuropathy: Secondary | ICD-10-CM

## 2017-12-15 LAB — BASIC METABOLIC PANEL
ANION GAP: 12 (ref 5–15)
BUN: 24 mg/dL — ABNORMAL HIGH (ref 8–23)
CO2: 32 mmol/L (ref 22–32)
CREATININE: 0.96 mg/dL (ref 0.44–1.00)
Calcium: 8.7 mg/dL — ABNORMAL LOW (ref 8.9–10.3)
Chloride: 98 mmol/L (ref 98–111)
GFR calc Af Amer: >60 mL/min (ref >60)
GFR calc non Af Amer: 57 mL/min — ABNORMAL LOW (ref >60)
GLUCOSE: 99 mg/dL (ref 70–99)
Potassium: 3.9 mmol/L (ref 3.5–5.1)
Sodium: 142 mmol/L (ref 135–145)

## 2017-12-15 LAB — GLUCOSE, CAPILLARY
Glucose-Capillary: 76 mg/dL (ref 70–99)
Glucose-Capillary: 87 mg/dL (ref 70–99)
Glucose-Capillary: 152 mg/dL — ABNORMAL HIGH (ref 70–99)

## 2017-12-15 LAB — CBC
HCT: 34.8 % — ABNORMAL LOW (ref 36.0–46.0)
Hemoglobin: 10.4 g/dL — ABNORMAL LOW (ref 12.0–15.0)
MCH: 28 pg (ref 26.0–34.0)
MCHC: 29.9 g/dL — ABNORMAL LOW (ref 30.0–36.0)
MCV: 93.8 fL (ref 78.0–100.0)
Platelets: 365 10*3/uL (ref 150–400)
RBC: 3.71 MIL/uL — ABNORMAL LOW (ref 3.87–5.11)
RDW: 14.5 % (ref 11.5–15.5)
WBC: 10.3 10*3/uL (ref 4.0–10.5)

## 2017-12-15 MED ORDER — PREDNISONE 10 MG PO TABS: ORAL_TABLET | ORAL | 0 refills | Status: DC

## 2017-12-15 MED ORDER — ALUM & MAG HYDROXIDE-SIMETH 200-200-20 MG/5ML PO SUSP
30.0000 mL | Freq: Once | ORAL | Status: AC
  Administered 2017-12-15: 30 mL via ORAL
  Filled 2017-12-15: qty 30

## 2017-12-15 MED ORDER — LEVALBUTEROL HCL 0.63 MG/3ML IN NEBU
0.6300 mg | INHALATION_SOLUTION | Freq: Three times a day (TID) | RESPIRATORY_TRACT | Status: DC
  Administered 2017-12-15 (×2): 0.63 mg via RESPIRATORY_TRACT
  Filled 2017-12-15 (×2): qty 3

## 2017-12-15 MED ORDER — METFORMIN HCL 500 MG PO TABS: 500.0000 mg | ORAL_TABLET | Freq: Two times a day (BID) | ORAL | Status: AC

## 2017-12-15 MED ORDER — FAMOTIDINE 20 MG PO TABS
20.0000 mg | ORAL_TABLET | Freq: Every day | ORAL | Status: DC
  Filled 2017-12-15: qty 1

## 2017-12-15 MED ORDER — METOPROLOL TARTRATE 25 MG PO TABS: 25.0000 mg | ORAL_TABLET | Freq: Two times a day (BID) | ORAL | 0 refills | Status: DC

## 2017-12-15 MED ORDER — LISINOPRIL 2.5 MG PO TABS: 2.5000 mg | ORAL_TABLET | Freq: Every day | ORAL | 0 refills | Status: DC

## 2017-12-15 MED ORDER — GUAIFENESIN-DM 100-10 MG/5ML PO SYRP
5.0000 mL | ORAL_SOLUTION | ORAL | Status: DC | PRN
  Administered 2017-12-15: 5 mL via ORAL
  Filled 2017-12-15: qty 5

## 2017-12-15 MED ORDER — IPRATROPIUM BROMIDE 0.02 % IN SOLN
0.5000 mg | Freq: Three times a day (TID) | RESPIRATORY_TRACT | Status: DC
  Administered 2017-12-15 (×2): 0.5 mg via RESPIRATORY_TRACT
  Filled 2017-12-15 (×2): qty 2.5

## 2017-12-15 MED ORDER — FUROSEMIDE 40 MG PO TABS: 40.0000 mg | ORAL_TABLET | Freq: Every day | ORAL | 0 refills | Status: DC

## 2017-12-15 NOTE — Discharge Instructions (Signed)

## 2017-12-15 NOTE — Telephone Encounter (Signed)
Called and spoke to [place] and relayed to her that the patient's son should have all the paperwork. Nothing further is needed.

## 2017-12-15 NOTE — Telephone Encounter (Signed)
I don't necessarily have a preference, but if she would not like to choose another provider within [facility] then I have confidence in all of the Physicians at [facility].

## 2017-12-15 NOTE — Telephone Encounter (Signed)
This document has been received and is in RB's look at. This form was already filled out once and given to the pt's son at her last OV.

## 2017-12-15 NOTE — Progress Notes (Signed)
RN informed pt of discharge instructions. Pt verbalizes understanding and states she has no questions. Pt belongings with pt. Pt is not in distress. Pt discharged via wheelchair. Pt's son is driving her home.

## 2017-12-15 NOTE — Consult Note (Signed)
   [facility] Inpatient Consult   12/15/2017  DOROTHYE BERNI 1945-09-21 [id number]   Patient screened for high risk in [place].Patient is in the [place] of the [place] Management services under patient's Medicare  plan.  Met with the patient and her son at the bedside to discuss [place] Management services. Patient is currently with [facility] this office provides the transition of care calls and follow up. Chart review for needs.  Patient has personal care services. Declining home health.  Patient states she is willing to have post hospital calls for COPD EMMI.   Asked if she was obligated and her son said, "No she told you it was a free service and all." Patient was given a brochure and 24 hour nurse advise line magnet.  Patient states she might want to talk further about SCAT for just in case.  "I am not that able to just wait around on them with my breathing problems and all."  Son states, "you are not going to go through all of that with [facility], momma.  You can't tolerate that."  For questions contact:   Natividad Brood, RN BSN [facility] Liaison  [phone] business mobile phone Toll free office [phone]

## 2017-12-15 NOTE — Plan of Care (Signed)
?  Problem: Education: ?Goal: Ability to demonstrate management of disease process will improve ?Outcome: Progressing ?  ?Problem: Activity: ?Goal: Capacity to carry out activities will improve ?Outcome: Progressing ?  ?Problem: Cardiac: ?Goal: Ability to achieve and maintain adequate cardiopulmonary perfusion will improve ?Outcome: Progressing ?  ?

## 2017-12-15 NOTE — Telephone Encounter (Signed)
Ria Comment do you know if this was ever received?

## 2017-12-15 NOTE — Telephone Encounter (Signed)
Called and relayed Dr. Agustina Caroli recommendations to Kevin.  Nothing further needed at this time.

## 2017-12-15 NOTE — Discharge Summary (Signed)
Physician Discharge Summary  DARENDA FIKE DHR:[id number] DOB: 08-27-1945  PCP: [name], MD  Admit date: 12/09/2017 Discharge date: 12/15/2017  Recommendations for Outpatient Follow-up:  1. Dr. Cathlean Cower, PCP in 1 week with repeat labs (CBC & BMP).  Patient indicates that she is unhappy with her current PCP and is exploring options to find new PCP. 2. Dr. Baltazar Apo, pulmonology on 01/11/2018 at 3:30 PM. 3. Richardson Dopp, PA-C/Cardiology 4. Dr. Curt Bears, Medical Oncology  Home Health: Patient declined home health services that were offered. Equipment/Devices: None  Discharge Condition: Improved and stable. CODE STATUS: DNR. Diet recommendation: Heart healthy and diabetic diet.  Discharge Diagnoses:  Active Problems:   Essential hypertension   COPD (chronic obstructive pulmonary disease) (HCC)   Respiratory failure with hypoxia (HCC)   Primary cancer of right upper lobe of lung (HCC)   Anemia   Restless legs syndrome (RLS)   GERD (gastroesophageal reflux disease)   DM type 2 with diabetic peripheral neuropathy (HCC)   Anxiety and depression   CAD (coronary artery disease)   Inappropriate sinus tachycardia   Acute on chronic diastolic CHF (congestive heart failure) (HCC)   Pleural effusion   Chest pain   Brief Summary: 72 year old female with PMH of right upper lobe lung cancer status post radiation and chemotherapy, anemia, A. fib, COPD, chronic respiratory failure on home oxygen 4 L/min, CAD, anxiety, depression, DVT, GERD, HLD, HTN, DM 2 presented to ED on 8/30 due to progressive dyspnea, prolonged history of URI symptoms unrelieved with outpatient courses of steroids and antibiotics, had persistent tachycardia ongoing for 2 months, weight gain after holding Lasix, diarrhea and persistent leg swelling.  She was admitted for worsening pleural effusion, lower extremity edema due to acute on chronic diastolic CHF.   Assessment & Plan:    Acute on chronic  diastolic CHF: Initially treated with IV Lasix and then subsequently transitioned to oral Lasix 40 mg daily. TTE 9/1: LVEF 60-65% and grade 1 diastolic dysfunction.  Lisinopril and metoprolol were initiated.  On day prior to discharge, patient reported persistent significant leg swelling/edema with pain post activity with PT.  Given history of DVT and malignancy, obtained bilateral lower extremity venous Dopplers which were negative for DVT. Briefly changed Lasix back to IV 40 mg every 12 hours on 9/4.  She diuresed aggressively, -3.1 L yesterday and -4.8 L since admission.  Volume status has significantly improved.  Leg pains have resolved.  Resume prior home dose of Lasix 40 mg daily which she had not been taking PTA.  Counseled her regarding importance of compliance with medications, salt and water restriction.  She verbalized understanding.  Improved and stable.  Also recommend outpatient follow-up with cardiology Home she last saw mid last year but has not followed up.  Nuclear stress test and echocardiogram were then recommended which patient had declined.  Bilateral lower extremity edema: Likely due to decompensated CHF, hypoalbuminemia.  Lower extremity venous Dopplers 9/4 neg for DVT.  Diuresed as indicated above and improved.  Continue Lasix at home.  COPD exacerbation: Exacerbation has resolved.  She completed a course of azithromycin.  Pulmonology had consulted and signed off 9/3.  Patient has follow-up appointment with [name] on 10/2.  Continue prior home regimen of albuterol inhaler/nebulizations, prednisone taper, Trelegy Ellipta.  Acute on chronic hypoxic respiratory failure: Likely related to decompensated CHF and COPD exacerbation.  Improved and back to baseline..  Type II DM with hyperglycemia: Reasonable inpatient control with SSI.  Resume metformin  at discharge.  Essential hypertension: Controlled.  Continue lisinopril and metoprolol.  Right upper lobe lung cancer stage  IIIa/mass complicated by exudative right pleural effusion: Status post ultrasound-guided thoracentesis 1.4 L.  Pleural fluid cytology 9/2: No malignant cells identified.  Reactive mesothelial cells present..  Outpatient follow-up with pulmonology.  Outpatient follow-up with oncology.  Improved.  Anxiety: Stable.  Continue prior home Xanax.  Hypokalemia and hypomagnesemia: Replaced.  Anemia of chronic disease: Stable.  Tachycardia:  Sinus tachycardia.  Resolved.  Likely multifactorial due to acute pulmonary issues and decompensated CHF.  Continue metoprolol.  Diarrhea: Seems to have resolved.  Adult failure to thrive: Multifactorial as above.  DNR now.      Consultants:  Pulmonology. IR  Procedures:  Ultrasound-guided right thoracentesis by IR.   Discharge Instructions  Discharge Instructions    (HEART FAILURE PATIENTS) Call MD:  Anytime you have any of the following symptoms: 1) 3 pound weight gain in 24 hours or 5 pounds in 1 week 2) shortness of breath, with or without a dry hacking cough 3) swelling in the hands, feet or stomach 4) if you have to sleep on extra pillows at night in order to breathe.   Complete by:  As directed    Call MD for:  difficulty breathing, headache or visual disturbances   Complete by:  As directed    Call MD for:  extreme fatigue   Complete by:  As directed    Call MD for:  persistant dizziness or light-headedness   Complete by:  As directed    Call MD for:  persistant nausea and vomiting   Complete by:  As directed    Call MD for:  severe uncontrolled pain   Complete by:  As directed    Call MD for:  temperature >100.4   Complete by:  As directed    Diet - low sodium heart healthy   Complete by:  As directed    Diet Carb Modified   Complete by:  As directed    Increase activity slowly   Complete by:  As directed        Medication List    STOP taking these medications   UNABLE TO FIND     TAKE these medications    ACCU-CHEK AVIVA PLUS test strip Generic drug:  glucose blood USE AS DIRECTED TO CHECK BLOOD SUGAR THREE TIMES DAILY   ACCU-CHEK AVIVA PLUS w/Device Kit Use as directed to check blood sugar Dx: E11.9, E11.49 Notes to patient:  See directions   albuterol 108 (90 Base) MCG/ACT inhaler Commonly known as:  PROVENTIL HFA;VENTOLIN HFA Inhale 2 puffs into the lungs every 6 (six) hours as needed for wheezing or shortness of breath. What changed:  Another medication with the same name was changed. Make sure you understand how and when to take each.   albuterol (2.5 MG/3ML) 0.083% nebulizer solution Commonly known as:  PROVENTIL USE 1 VIAL VIA NEBULIZER EVERY 6 HOURS AS NEEDED FOR WHEEZING OR SHORTNESS OF BREATH What changed:  See the new instructions.   ALPRAZolam 1 MG tablet Commonly known as:  XANAX TAKE 1 TABLET BY MOUTH 3 TIMES A DAY AS NEEDED FOR ANXIETY What changed:    how much to take  how to take this  when to take this  reasons to take this  additional instructions   aspirin 81 MG tablet Take 81 mg by mouth daily.   CALTRATE PLUS PO Take 1 tablet by mouth daily.   FLUTTER Kerrin Mo  Use a directed Notes to patient:  See directions   furosemide 40 MG tablet Commonly known as:  LASIX Take 1 tablet (40 mg total) by mouth daily.   Guaifenesin 1200 MG Tb12 Take 1 tablet (1,200 mg total) by mouth 2 (two) times daily.   HYDROcodone-acetaminophen 10-325 MG tablet Commonly known as:  NORCO Take 1 tablet by mouth 2 (two) times daily as needed.   lisinopril 2.5 MG tablet Commonly known as:  PRINIVIL,ZESTRIL Take 1 tablet (2.5 mg total) by mouth daily. Start taking on:  12/16/2017   loratadine 10 MG tablet Commonly known as:  CLARITIN TAKE 1 TABLET BY MOUTH EVERY DAY   metFORMIN 500 MG tablet Commonly known as:  GLUCOPHAGE Take 1 tablet (500 mg total) by mouth 2 (two) times daily. What changed:  See the new instructions.   metoprolol tartrate 25 MG tablet Commonly  known as:  LOPRESSOR Take 1 tablet (25 mg total) by mouth 2 (two) times daily.   omeprazole 20 MG capsule Commonly known as:  PRILOSEC Take 1 capsule (20 mg total) by mouth daily.   OXYGEN Inhale 4 L into the lungs. Notes to patient:  See directions   potassium chloride 10 MEQ tablet Commonly known as:  K-DUR,KLOR-CON Take 1 tablet (10 mEq total) daily by mouth.   predniSONE 10 MG tablet Commonly known as:  DELTASONE Take 3 tabs daily for 3 days, then 2 tabs daily for 3 days, then 1 tab daily for 3 days, then stop. Start taking on:  12/16/2017 Notes to patient:  See directions   senna 8.6 MG tablet Commonly known as:  SENOKOT Take 1 tablet by mouth 2 (two) times daily as needed for constipation.   traMADol 50 MG tablet Commonly known as:  ULTRAM TAKE 2 TABLETS (100MG TOTAL) BY MOUTH EVERY 6 HOURS AS NEEDED FOR SEVERE PAIN What changed:  See the new instructions.   TRELEGY ELLIPTA 100-62.5-25 MCG/INH Aepb Generic drug:  Fluticasone-Umeclidin-Vilant INHALE 1 PUFF BY MOUTH ONCE DAILY What changed:  See the new instructions.      Follow-up Information    [name], MD. Schedule an appointment as soon as possible for a visit in 1 week(s).   Specialties:  Internal Medicine, Radiology Why:  To be seen with repeat labs (CBC & BMP). Contact information: [place] [address] [phone]        Collene Gobble, MD Follow up on 01/11/2018.   Specialty:  Pulmonary Disease Why:  3:30 pm. Contact information: [address] [place] [address] [phone]        Curt Bears, MD. Schedule an appointment as soon as possible for a visit.   Specialty:  Oncology Contact information: [place] [address] [phone]        Richardson Dopp T, [place]. Schedule an appointment as soon as possible for a visit.   Specialties:  Cardiology, Physician Assistant Contact information: [address] [place]  [address] [phone]          Allergies  Allergen Reactions  . Iohexol Shortness Of Breath and Other (See Comments)    "burns me up inside"    Pt does fine with 13 hour prep  01/17/12  . Other     CAN'T STAND THE SMELL OF PURE BLEACH; "HAVE TO BACK UP AND POUR AND DON'T BREATH IT TIL i GET CAP BACK ON; DILUTE IT TO SPRAY"  . Anoro Ellipta [Umeclidinium-Vilanterol] Swelling  Swelling of lips and mouth  . Codeine Itching and Nausea And Vomiting    REACTION: GI upset  . Protonix [Pantoprazole Sodium] Diarrhea      Procedures/Studies: Dg Chest 1 View  Result Date: 12/12/2017 CLINICAL DATA:  Status post right thoracentesis today. EXAM: CHEST  1 VIEW COMPARISON:  PA and lateral chest 12/09/2017 and 11/08/2017. FINDINGS: Right pleural effusion is markedly decreased after thoracentesis. No pneumothorax. Right upper lobe opacity seen on prior examinations is unchanged. Left lung is clear. Heart size is normal. IMPRESSION: Negative for pneumothorax after thoracentesis. No change in a masslike right upper lobe opacity. Electronically Signed   By: Inge Rise M.D.   On: 12/12/2017 10:28   Dg Chest 2 View  Result Date: 12/13/2017 CLINICAL DATA:  Lung carcinoma with shortness of breath EXAM: CHEST - 2 VIEW COMPARISON:  December 12, 2017 chest radiograph; chest CT November 10, 2017 FINDINGS: The previous consolidation with probable mass involving the axillary subsegment of the right upper lobe is again noted, similar to 1 day prior. There is postoperative change on the left, stable. No new opacity is evident. Heart size is normal. There is distortion of pulmonary vascularity on the right, stable. There is concern for adenopathy in the right perihilar region, stable. There are no blastic or lytic bone lesions. Patient is status post kyphoplasty in the upper thoracic region. There is an adjacent wedge compression fracture in the upper thoracic region, stable. No pneumothorax.  IMPRESSION: Similar appearance compared to 1 day prior. Apparent mass with consolidation on the right in the axillary segment right upper lobe region. Areas of volume loss bilaterally noted. Questionable right hilar adenopathy. Stable cardiac silhouette. No demonstrable pneumothorax. Electronically Signed   By: Lowella Grip III M.D.   On: 12/13/2017 08:15   Dg Chest 2 View  Result Date: 12/09/2017 CLINICAL DATA:  Patient reports sob, nausea, diarrhea, and left chest pain for 1 month. Swelling, and tingling in hands and feet for 2 months. No blood seen in stool. Hx of adenocarcinoma lung ULL, asthma, atrial fibrillation. EXAM: CHEST - 2 VIEW COMPARISON:  11/10/2017 CT, 11/08/2017 x-ray FINDINGS: The continues to be a masslike consolidation of the RIGHT UPPER lobe. RIGHT pleural effusion has increased. There is RIGHT basilar opacity, partially obscuring the hemidiaphragm. Stable appearance of postoperative changes in the LEFT hilar region. LEFT lung is clear. IMPRESSION: Increased RIGHT pleural effusion and RIGHT basilar opacity. Stable appearance of masslike consolidation in the RIGHT UPPER lobe. Electronically Signed   By: Nolon Nations M.D.   On: 12/09/2017 13:37   US Thoracentesis Asp Pleural Space W/img Guide  Result Date: 12/12/2017 INDICATION: Shortness of breath. Lung cancer. Right-sided pleural effusion. Request diagnostic and therapeutic thoracentesis. EXAM: ULTRASOUND GUIDED RIGHT THORACENTESIS MEDICATIONS: None. COMPLICATIONS: None immediate. PROCEDURE: An ultrasound guided thoracentesis was thoroughly discussed with the patient and questions answered. The benefits, risks, alternatives and complications were also discussed. The patient understands and wishes to proceed with the procedure. Written consent was obtained. Ultrasound was performed to localize and mark an adequate pocket of fluid in the right chest. The area was then prepped and draped in the normal sterile fashion. 1% Lidocaine  was used for local anesthesia. Under ultrasound guidance a 6 Fr Safe-T-Centesis catheter was introduced. Thoracentesis was performed. The catheter was removed and a dressing applied. FINDINGS: A total of approximately 1.4 L of clear yellow fluid was removed. Samples were sent to the laboratory as requested by the clinical team. IMPRESSION: Successful ultrasound guided right thoracentesis  yielding 1.4 L of pleural fluid. Read by: Ascencion Dike PA-C Electronically Signed   By: Jerilynn Mages.  Shick M.D.   On: 12/12/2017 10:09      Subjective: Patient interviewed and examined along with daughter at bedside.  She reports feeling much better.  No dyspnea, cough reported.  States that she has dryness of her nose and mouth.  Leg swelling significantly improved and no further pain.  Urinated well after IV Lasix yesterday.  Denies any other complaints.  Discharge Exam:  Vitals:   12/15/17 0723 12/15/17 0815 12/15/17 1209 12/15/17 1323  BP:  (!) 128/58 (!) 127/46   Pulse:  65 75   Resp:   18   Temp:   98.7 F (37.1 C)   TempSrc:   Oral   SpO2: 100%  99% 98%  Weight:      Height:        General exam: Pleasant elderly female, moderately built and nourished, sitting up comfortably in bed this morning. Respiratory system: Slightly diminished breath sounds in the bases but otherwise clear to auscultation. Respiratory effort normal. Cardiovascular system: S1 & S2 heard, RRR. No JVD, murmurs, rubs, gallops or clicks.    Bilateral leg edema significantly improved and now trace leg edema present.  Telemetry personally reviewed: Sinus rhythm. Gastrointestinal system: Abdomen is nondistended, soft and nontender. No organomegaly or masses felt. Normal bowel sounds heard. Central nervous system: Alert and oriented. No focal neurological deficits.  Hard of hearing. Extremities: Symmetric 5 x 5 power. Skin: No rashes, lesions or ulcers Psychiatry: Judgement and insight appear impaired. Mood & affect appropriate.      The results of significant diagnostics from this hospitalization (including imaging, microbiology, ancillary and laboratory) are listed below for reference.     Microbiology: Recent Results (from the past 240 hour(s))  Culture, body fluid-bottle     Status: None (Preliminary result)   Collection Time: 12/12/17 10:29 AM  Result Value Ref Range Status   Specimen Description PLEURAL FLUID  Final   Special Requests NONE  Final   Culture   Final    NO GROWTH 3 DAYS Performed at [facility], [address], [place], [place] [address]    Report Status PENDING  Incomplete  Gram stain     Status: None   Collection Time: 12/12/17 10:29 AM  Result Value Ref Range Status   Specimen Description PLEURAL FLUID  Final   Special Requests NONE  Final   Gram Stain   Final    ABUNDANT WBC PRESENT, PREDOMINANTLY MONONUCLEAR NO ORGANISMS SEEN Performed at [facility], [address] [place], [place] [address]    Report Status 12/12/2017 FINAL  Final     Labs: CBC: Recent Labs  Lab 12/09/17 1304 12/10/17 0815 12/11/17 0641 12/15/17 0434  WBC 8.2 7.8 7.1 10.3  HGB 10.0* 9.9* 9.4* 10.4*  HCT 32.6* 32.5* 30.0* 34.8*  MCV 92.6 92.1 90.1 93.8  PLT 355 372 372 341   Basic Metabolic Panel: Recent Labs  Lab 12/10/17 0815 12/11/17 0641 12/12/17 0516 12/13/17 0657 12/14/17 0436 12/15/17 0434  NA 140 137 141 142 141 142  K 3.3* 3.3* 3.3* 3.9 4.4 3.9  CL 98 93* 98 102 102 98  CO2 30 32 32 34* 32 32  GLUCOSE 98 191* 122* 101* 99 99  BUN 7* 19 21 28* 21 24*  CREATININE 0.94 1.06* 0.98 0.96 0.87 0.96  CALCIUM 8.4* 8.0* 8.0* 8.4* 8.4* 8.7*  MG 1.1*  --   --  1.4* 1.9  --   PHOS 3.3  --   --   --   --   --    Liver Function Tests: Recent Labs  Lab 12/10/17 0815 12/11/17 0641  AST 16 13*  ALT 11 10  ALKPHOS 64 62  BILITOT 1.1 0.6  PROT 5.8* 5.4*  ALBUMIN 2.8* 2.6*   BNP (last 3 results) Recent Labs    08/11/17 2131 12/09/17 1304  BNP 33.4 27.3   Cardiac  Enzymes: Recent Labs  Lab 12/09/17 2011 12/10/17 0331 12/10/17 0815  TROPONINI <0.03 <0.03 <0.03   CBG: Recent Labs  Lab 12/14/17 2017 12/14/17 2349 12/15/17 0402 12/15/17 0802 12/15/17 1211  GLUCAP 220* 129* 76 87 152*    Discussed in detail with patient's daughter at bedside.  Updated care and answered all questions.  Discussed with patient's RN and case management.   Time coordinating discharge: 40 minutes  SIGNED:  Vernell Leep, MD, FACP, [facility]. [facility] Pager [phone]  If 7PM-7AM, please contact night-coverage [email or web address] Password [email or web address] 12/15/2017, 1:33 PM

## 2017-12-16 ENCOUNTER — Telehealth: Payer: Self-pay | Admitting: Emergency Medicine

## 2017-12-16 ENCOUNTER — Telehealth: Payer: Self-pay | Admitting: *Deleted

## 2017-12-16 ENCOUNTER — Ambulatory Visit: Payer: Medicare Other | Admitting: Podiatry

## 2017-12-16 NOTE — Telephone Encounter (Addendum)
Called son (on [place]) as requested and discussed patient's concerns. Son is understanding that patient should continue her PTA meds and also start the new medications that were rx'd by hospitalist. He states that he has also tried to explain this to patient multiple times and that she is getting "crazy" with her worry over her medications. He is in agreement that [facility] services would be useful for patient and that sooner HFU appointment is a good idea. He will discuss with patient and call back to arrange a sooner appointment and will discuss [facility] w/ PCP at appt. He also requested we mail another med list to patient. I placed two copies of patient's med list in the mail (one for patient and one for son) along with a note to take all meds as directed and that it is okay to take her PTA meds along with the new meds and to call us with any questions. No further needs identified at this time.

## 2017-12-16 NOTE — Telephone Encounter (Signed)
Transition Care Management Follow-up Telephone Call  Per Discharge Summary:  PCP: [name], MD  Admit date: 12/09/2017 Discharge date: 12/15/2017  Recommendations for Outpatient Follow-up:  1. Dr. Cathlean Cower, PCP in 1 week with repeat labs (CBC & BMP).  Patient indicates that she is unhappy with her current PCP and is exploring options to find new PCP. 2. Dr. Baltazar Apo, pulmonology on 01/11/2018 at 3:30 PM. 3. Richardson Dopp, PA-C/Cardiology 4. Dr. Curt Bears, Medical Oncology  Home Health: Patient declined home health services that were offered. Equipment/Devices: None  Discharge Condition: Improved and stable. CODE STATUS: DNR. Diet recommendation: Heart healthy and diabetic diet.  --   How have you been since you were released from the hospital? "Very confused, my medicine is messed up and I don't know if I'm taking the right stuff." All medications reviewed with patient and questions answered.   Do you understand why you were in the hospital? yes   Do you understand the discharge instructions? no   Where were you discharged to? Home   Items Reviewed:  Medications reviewed: yes  Allergies reviewed: yes  Dietary changes reviewed: yes  Referrals reviewed: yes   Functional Questionnaire:   Activities of Daily Living (ADLs):   She states they are independent in the following: ambulation, feeding and grooming States they require assistance with the following: bathing and hygiene, grooming, toileting and dressing   Any transportation issues/concerns?: yes, "but I don't want the SCAT. I do the best I can."   Any patient concerns? yes, patient would like to know if she should continue to take atorvastatin. This was dc'd from her medication list on 11/30/17 at pulmonology appointment, but it is unclear if this was intentional. Patient would like a call back prior to appointment with instructions regarding continuing atorvastatin.   Confirmed importance  and date/time of follow-up visits scheduled yes  Provider Appointment booked with Dr. Cathlean Cower 01/02/18 @ 3:40pm. This was a previously scheduled 1 month follow up and patient declined to schedule a hospital follow-up appointment within 2 weeks.  Confirmed with patient if condition begins to worsen call PCP or go to the ER.  Patient was given the office number and encouraged to call back with question or concerns.  : yes

## 2017-12-16 NOTE — Telephone Encounter (Addendum)
Pt notified of instructions and atorvastatin added back to med list. However, on return call she then stated that she does not know what medications she is supposed to be taking and does not understand why she needs all of these medications. I again reviewed medications and indications, which we also did during initial phone call at which time she had take notes about the indications for each medicine. Patient states she is afraid that all of these medication are going to kill her. I explained to her that her medications work differently and they have been reviewed by multiple doctors to ensure that her regimen is safe for her. She continued to state that she was confused and requested that I call her son in 20 minutes and review her medications with him, although she currently manages/prepares her own meds. I also discussed the possibility of [facility] RN for medication management/transitional care, which discharge summary states she declined while inpatient. She is agreeable to [facility] services now, but is wary of cost. Lastly, I again recommended that she schedule a sooner appointment with Dr. Jenny Reichmann than 9/23 so she can discuss her medications and other concerns, but patient stated, "I can't, I just got out of the hospital."  Will call son and review medications as requested.

## 2017-12-16 NOTE — Telephone Encounter (Signed)
Ok to continue the statin

## 2017-12-17 LAB — CULTURE, BODY FLUID W GRAM STAIN -BOTTLE: Culture: NO GROWTH

## 2017-12-17 LAB — CULTURE, BODY FLUID-BOTTLE

## 2017-12-19 NOTE — Telephone Encounter (Signed)
Error

## 2017-12-21 ENCOUNTER — Ambulatory Visit: Payer: Medicare Other | Admitting: Primary Care

## 2017-12-21 ENCOUNTER — Telehealth: Payer: Self-pay | Admitting: Emergency Medicine

## 2017-12-21 NOTE — Telephone Encounter (Signed)
Per RB last recommendations patient will need to be seen if she continues to request prednisone for sickness. Patient is currently on prednisone and wants another antibiotic. Patient has fever, chills, shortness of breath, congestion and cough. Patient is using current medication regimen that she is on.   Patient has been scheduled for appointment with NP.

## 2017-12-23 DIAGNOSIS — E119 Type 2 diabetes mellitus without complications: Secondary | ICD-10-CM | POA: Diagnosis not present

## 2017-12-23 DIAGNOSIS — I509 Heart failure, unspecified: Secondary | ICD-10-CM | POA: Diagnosis not present

## 2017-12-23 DIAGNOSIS — M1990 Unspecified osteoarthritis, unspecified site: Secondary | ICD-10-CM | POA: Diagnosis not present

## 2017-12-23 DIAGNOSIS — J449 Chronic obstructive pulmonary disease, unspecified: Secondary | ICD-10-CM | POA: Diagnosis not present

## 2017-12-23 DIAGNOSIS — C3490 Malignant neoplasm of unspecified part of unspecified bronchus or lung: Secondary | ICD-10-CM | POA: Diagnosis not present

## 2017-12-26 ENCOUNTER — Ambulatory Visit (INDEPENDENT_AMBULATORY_CARE_PROVIDER_SITE_OTHER): Payer: Medicare Other | Admitting: Emergency Medicine

## 2017-12-26 ENCOUNTER — Encounter: Payer: Self-pay | Admitting: Emergency Medicine

## 2017-12-26 DIAGNOSIS — K219 Gastro-esophageal reflux disease without esophagitis: Secondary | ICD-10-CM

## 2017-12-26 DIAGNOSIS — C3411 Malignant neoplasm of upper lobe, right bronchus or lung: Secondary | ICD-10-CM

## 2017-12-26 DIAGNOSIS — J90 Pleural effusion, not elsewhere classified: Secondary | ICD-10-CM

## 2017-12-26 MED ORDER — VALSARTAN 80 MG PO TABS: 80.0000 mg | ORAL_TABLET | Freq: Every day | ORAL | 2 refills | Status: DC

## 2017-12-26 MED ORDER — FLUTICASONE PROPIONATE 50 MCG/ACT NA SUSP: 2.0000 | Freq: Every day | NASAL | 2 refills | Status: DC

## 2017-12-26 NOTE — Patient Instructions (Signed)
Please continue Trelegy as you have been taking it.  Remember to rinse and gargle after using. Use your albuterol either 2 puffs or 1 nebulizer treatment up to every 4 hours as needed for shortness of breath, congestion, chest tightness, wheezing. Continue your oxygen as you have been using it. Continue Lasix 40 mg daily as you have been doing since her recent hospitalization. Please stop lisinopril. Start valsartan 80 mg once daily. Please stop loratadine (Claritin). Start Allegra (or generic Fexofenadine) 180 mg once daily. Start fluticasone nasal spray, 2 sprays each nostril once daily. Depending on how you improve we will decide whether you need to be on low-dose daily prednisone. Get your CT scan of the chest and follow-up with [name] as already planned. Follow with Dr Lamonte Sakai in 1 month

## 2017-12-26 NOTE — Assessment & Plan Note (Signed)
She recently finished her prednisone taper post hospitalization.  She is having more mucus but it sounds like nasal congestion and throat mucus.  Unclear to me whether this is worsening control of her COPD.  She is labile and may ultimately need to be on daily prednisone to manage symptoms.  We will try to address her rhinitis component as aggressively as possible.  She does not believe loratadine is helping her, I will change this to fexofenadine, and fluticasone nasal spray.  Continue Trelegy and albuterol as needed.

## 2017-12-26 NOTE — Assessment & Plan Note (Signed)
Given her history of cough, pulmonary symptoms I do not want her on lisinopril.  I will change this over to valsartan.

## 2017-12-26 NOTE — Assessment & Plan Note (Addendum)
Currently on observation.  She has a new nodule that we are following for interval change.  The rest of her abnormalities are subacute, chronic in the right upper lobe.  She had been on immunotherapy but with her other significant medical issues, COPD, mucus production, diastolic CHF she was not tolerating.

## 2017-12-26 NOTE — Progress Notes (Signed)
Subjective:    Patient ID: [name], female    DOB: [birth year], 72 y.o.   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  She also has hypertension with diastolic dysfunction.  She was hospitalized in late August with progressive dyspnea, edema.  She was diuresed with improvement.  She was also treated empirically for possible exacerbation of COPD.  A right thoracentesis was done on 12/12/2017 and fluid appear to be most consistent with a transudate, culture negative, cytology negative. She reports that she is having some increased cough, nasal congestion, white sputum. She is on loratadine, omeprazole. She is on trelegy, uses albuterol nebs about 2x a day. Remains on lasix 40mg  daily, was started on lisinopril during the hospitalization. She finished her pred taper end of last week.   She is unsure when she is supposed to have her repeat CT chest, see Dr Julien Nordmann.       Objective:   Physical Exam Vitals:   12/26/17 1200  BP: 124/64  Pulse: 97  SpO2: 98%  Weight: 166 lb (75.3 kg)  Height: 5\' 6"  (1.676 m)   Gen: Pleasant, elderly woman, in no distress,  normal affect  ENT: No lesions, some nasal congestion  Neck: No JVD, no stridor  Lungs: No use of accessory muscles, coarse B especially at bases, soft exp wheze.   Cardiovascular: regular, 3/6 systolic M with an intact S2. Edema is improved  Musculoskeletal: No deformities, no cyanosis or clubbing.   Neuro: alert, non focal  Skin: Warm, no lesions or rashes   CBC Latest Ref Rng & Units 12/15/2017 12/11/2017 12/10/2017  WBC 4.0 - 10.5 K/uL 10.3 7.1 7.8  Hemoglobin 12.0 - 15.0 g/dL 10.4(L) 9.4(L) 9.9(L)  Hematocrit 36.0 - 46.0 % 34.8(L) 30.0(L) 32.5(L)  Platelets 150 - 400 K/uL 365 372 372   BMP Latest Ref Rng & Units  12/15/2017 12/14/2017 12/13/2017  Glucose 70 - 99 mg/dL 99 99 101(H)  BUN 8 - 23 mg/dL 24(H) 21 28(H)  Creatinine 0.44 - 1.00 mg/dL 0.96 0.87 0.96  BUN/Creat Ratio 12 - 28 - - -  Sodium 135 - 145 mmol/L 142 141 142  Potassium 3.5 - 5.1 mmol/L 3.9 4.4 3.9  Chloride 98 - 111 mmol/L 98 102 102  CO2 22 - 32 mmol/L 32 32 34(H)  Calcium 8.9 - 10.3 mg/dL 8.7(L) 8.4(L) 8.4(L)      CT chest 11/10/17:  COMPARISON:  11/03/2017  FINDINGS: Cardiovascular: Thoracic aorta demonstrates atherosclerotic calcifications without aneurysmal dilatation or dissection. Mild coronary calcifications are noted. No significant cardiac enlargement is seen. The pulmonary artery shows a normal branching pattern with the exception of changes consistent with the known left upper lobectomy. No filling defects to suggest pulmonary emboli are identified.  Mediastinum/Nodes: The thoracic inlet is within normal limits. Scattered small mediastinal lymph nodes are again identified and stable. No sizable hilar adenopathy is seen.  Lungs/Pleura: Persistent right suprahilar mass lesion is noted with occlusion of the right upper lobe bronchus. The overall appearance is stable from 7 days previous. Previously seen solid and ground-glass density in the right upper lobe is again identified and stable. Increase in the degree of right-sided pleural effusion is noted when compared with the prior exam. No new focal infiltrate is seen. Emphysematous changes are again identified.  Upper Abdomen: Cholelithiasis is noted without complicating factors.  Musculoskeletal: Degenerative changes of the thoracic spine are noted. Changes along the right chest wall are again seen similar to that noted on the recent exam. No new focal bony abnormality is seen. Stable changes of vertebral augmentation at T4 with T5 compression fracture noted.  Review of the MIP images confirms the above findings.  IMPRESSION: No evidence of pulmonary  emboli.  Stable changes bilaterally similar to that seen on the exam from 7 days previous.  Slight increase in size right-sided pleural effusion when compared with the recent exam.      Assessment & Plan:  COPD (chronic obstructive pulmonary disease) ([place]) She recently finished her prednisone taper post hospitalization.  She is having more mucus but it sounds like nasal congestion and throat mucus.  Unclear to me whether this is worsening control of her COPD.  She is labile and may ultimately need to be on daily prednisone to manage symptoms.  We will try to address her rhinitis component as aggressively as possible.  She does not believe loratadine is helping her, I will change this to fexofenadine, and fluticasone nasal spray.  Continue Trelegy and albuterol as needed.    Chronic cough Given her history of cough, pulmonary symptoms I do not want her on lisinopril.  I will change this over to valsartan.  GERD (gastroesophageal reflux disease) Continue omeprazole as ordered.  She does states she has some breakthrough GERD which certainly could contribute to her upper airway symptoms.  Pleural effusion Continue to follow serial imaging.  She did clinically benefit when she had a thoracentesis done during her recent hospitalization.  This appeared to be transudate of fluid.  Cytology was negative.  Primary cancer of right upper lobe of lung ([place]) Currently on observation.  She has a new nodule that we are following for interval change.  The rest of her abnormalities are subacute, chronic in the right upper lobe.  She had been on immunotherapy but with her other significant medical issues, COPD, mucus production, diastolic CHF she was not tolerating.  Baltazar Apo, MD, PhD 12/26/2017, 12:46 PM [facility] [phone] or if no answer [phone]

## 2017-12-26 NOTE — Assessment & Plan Note (Signed)
Continue to follow serial imaging.  She did clinically benefit when she had a thoracentesis done during her recent hospitalization.  This appeared to be transudate of fluid.  Cytology was negative.

## 2017-12-26 NOTE — Assessment & Plan Note (Signed)
Continue omeprazole as ordered.  She does states she has some breakthrough GERD which certainly could contribute to her upper airway symptoms.

## 2018-01-02 ENCOUNTER — Telehealth: Payer: Self-pay | Admitting: Emergency Medicine

## 2018-01-02 ENCOUNTER — Ambulatory Visit: Payer: Medicare Other | Admitting: Internal Medicine

## 2018-01-02 MED ORDER — LEVOFLOXACIN 500 MG PO TABS: 500.0000 mg | ORAL_TABLET | Freq: Every day | ORAL | 0 refills | Status: DC

## 2018-01-02 NOTE — Telephone Encounter (Signed)
Patient's son, Vonzella Nipple, is calling to add to previous message.  States patient's BP is running very low the past couple of days, systolic and diastolic, does not know exact number. States blood sugar was 146 this morning. He states patient is having trouble with her phone.  May call him at [phone], he listed on patient's DPR.

## 2018-01-02 NOTE — Telephone Encounter (Signed)
Noted  

## 2018-01-02 NOTE — Telephone Encounter (Signed)
Patients states she is running a fever, Having cold chills , coughing up brown and green mucus. For over a week now. An feels she may have bronchitis. Feels she can not make it in for an appointment.   She would like something precsribed to [facility] on [address]    Last OV  12-26-17  [name] please advise.

## 2018-01-02 NOTE — Telephone Encounter (Signed)
Okay to call in Levaquin 500 mg once daily for 7 days.  If she continues to have low blood pressure, feel systemically ill, or if she worsens in any way then she needs to go to be seen in the emergency department.

## 2018-01-02 NOTE — Telephone Encounter (Signed)
Pt is giving a new number to call her back on [phone]

## 2018-01-02 NOTE — Telephone Encounter (Signed)
Spoke with patient. She is aware of RB's response. Will go ahead and call in Levaquin for her. Verbalized understanding. Nothing else needed at time of call.

## 2018-01-02 NOTE — Telephone Encounter (Signed)
Spoke with pt's son Legrand Como (dpr on file), who verified that pt's blood pressure has been running low, and her blood sugars are running is the 140's.  Legrand Como has also asked that we call with recs instead of pt d/t her phone having issues.    RB please advise on recs.  Thanks!

## 2018-01-05 ENCOUNTER — Other Ambulatory Visit: Payer: Self-pay

## 2018-01-05 NOTE — Patient Outreach (Signed)
[facility]) Care Management  01/05/2018  ALYANAH ELLIOTT 1945/10/06 [id number]     EMMI-COPD RED ON EMMI ALERT Day # 14 Date: 01/04/18 Red Alert Reason: " Feeling worse overall? Yes  More chest pain than yesterday? Yes Coughing more than yesterday? Yes  More mucus or thicker mucus? Yes Mucus change color? Yes  Problems refilling medications? Yes"   Outreach attempt # 1 to patient. Spoke with patient who was very [place] and requested call be completed with her daughter. Spoke with daughter reviewed and discussed red alerts. She confirmed with patient that responses were inaccurate and due to patient not being able to hear the machine. She denies any of these red flag issues. Patient has a new PCP and states she has already had MD follow up appt. She confirmed that patient has all her meds and denies any issues or concerns regarding them. Patient has supportive son and daughter assisting her as needed. Both patient and spouse denies any RN CM needs or concerns at this time. Patient requested that she stop receiving automated call as she is unable to understand them.       Plan: RN CM will close case at this time.  RN CM will notify [facility] administrative assistant to deactivate calls.   Enzo Montgomery, RN,BSN,CCM [facility] Telephonic Care Management Coordinator Direct Phone: [phone] Toll Free: [phone] Fax: [phone]

## 2018-01-09 ENCOUNTER — Telehealth: Payer: Self-pay | Admitting: Emergency Medicine

## 2018-01-09 NOTE — Telephone Encounter (Signed)
Called and spoke with patient. She has been scheduled with Derl Barrow NP tomorrow 01/10/2018 at 1100 per Dr. Agustina Caroli rec  Nothing further needed.

## 2018-01-09 NOTE — Telephone Encounter (Signed)
ATC patient x2 - line never rang and automated message stated that patient does not have a VM that is set up yet.  WCB.

## 2018-01-09 NOTE — Telephone Encounter (Signed)
I think she will need to be seen- not sure what to add over the phone

## 2018-01-09 NOTE — Telephone Encounter (Signed)
Called and spoke with patient. Patient states having a fever (99.6), SOB, body aches especially in the back, coughing up white sputum, patient just here 12/26/2017 had levaquin ordered for her on 01/02/2018 per phone note. Patient states she has 2 pills left. Patient also c/o ankles swelling.   [name] please advise  RB paged at [phone]

## 2018-01-10 ENCOUNTER — Ambulatory Visit (INDEPENDENT_AMBULATORY_CARE_PROVIDER_SITE_OTHER)
Admission: RE | Admit: 2018-01-10 | Discharge: 2018-01-10 | Disposition: A | Discharge status: Home / Self Care | Payer: Medicare Other | Source: Ambulatory Visit | Attending: Primary Care | Admitting: Primary Care

## 2018-01-10 ENCOUNTER — Encounter: Payer: Self-pay | Admitting: Primary Care

## 2018-01-10 ENCOUNTER — Ambulatory Visit (INDEPENDENT_AMBULATORY_CARE_PROVIDER_SITE_OTHER): Payer: Medicare Other | Admitting: Primary Care

## 2018-01-10 ENCOUNTER — Other Ambulatory Visit (INDEPENDENT_AMBULATORY_CARE_PROVIDER_SITE_OTHER): Payer: Medicare Other

## 2018-01-10 VITALS — BP 102/50 | HR 98 | Ht 66.0 in | Wt 167.4 lb

## 2018-01-10 DIAGNOSIS — I5022 Chronic systolic (congestive) heart failure: Secondary | ICD-10-CM | POA: Diagnosis not present

## 2018-01-10 DIAGNOSIS — I10 Essential (primary) hypertension: Secondary | ICD-10-CM

## 2018-01-10 DIAGNOSIS — J90 Pleural effusion, not elsewhere classified: Secondary | ICD-10-CM

## 2018-01-10 DIAGNOSIS — J441 Chronic obstructive pulmonary disease with (acute) exacerbation: Secondary | ICD-10-CM

## 2018-01-10 DIAGNOSIS — I5033 Acute on chronic diastolic (congestive) heart failure: Secondary | ICD-10-CM | POA: Diagnosis not present

## 2018-01-10 DIAGNOSIS — R0602 Shortness of breath: Secondary | ICD-10-CM | POA: Diagnosis not present

## 2018-01-10 DIAGNOSIS — R509 Fever, unspecified: Secondary | ICD-10-CM | POA: Insufficient documentation

## 2018-01-10 LAB — BASIC METABOLIC PANEL
BUN: 12 mg/dL (ref 6–23)
CO2: 29 mEq/L (ref 19–32)
Calcium: 9.2 mg/dL (ref 8.4–10.5)
Chloride: 100 mEq/L (ref 96–112)
Creatinine, Ser: 1.21 mg/dL — ABNORMAL HIGH (ref 0.40–1.20)
GFR: 46.25 mL/min — AB (ref >60.00)
GLUCOSE: 98 mg/dL (ref 70–99)
POTASSIUM: 4.1 meq/L (ref 3.5–5.1)
Sodium: 138 mEq/L (ref 135–145)

## 2018-01-10 LAB — CBC WITH DIFFERENTIAL/PLATELET
BASOS PCT: 0.6 % (ref 0.0–3.0)
Basophils Absolute: 0.1 10*3/uL (ref 0.0–0.1)
EOS ABS: 0.3 10*3/uL (ref 0.0–0.7)
Eosinophils Relative: 3.8 % (ref 0.0–5.0)
HCT: 28.6 % — ABNORMAL LOW (ref 36.0–46.0)
Hemoglobin: 9.6 g/dL — ABNORMAL LOW (ref 12.0–15.0)
LYMPHS ABS: 1.1 10*3/uL (ref 0.7–4.0)
Lymphocytes Relative: 13.6 % (ref 12.0–46.0)
MCHC: 33.5 g/dL (ref 30.0–36.0)
MCV: 87.2 fl (ref 78.0–100.0)
Monocytes Absolute: 0.6 10*3/uL (ref 0.1–1.0)
Monocytes Relative: 7.9 % (ref 3.0–12.0)
Neutro Abs: 5.8 10*3/uL (ref 1.4–7.7)
Neutrophils Relative %: 74.1 % (ref 43.0–77.0)
PLATELETS: 343 10*3/uL (ref 150.0–400.0)
RBC: 3.27 Mil/uL — ABNORMAL LOW (ref 3.87–5.11)
RDW: 15.8 % — AB (ref 11.5–15.5)
WBC: 7.8 10*3/uL (ref 4.0–10.5)

## 2018-01-10 LAB — BRAIN NATRIURETIC PEPTIDE: Pro B Natriuretic peptide (BNP): 27 pg/mL (ref 0.0–100.0)

## 2018-01-10 MED ORDER — LEVOFLOXACIN 500 MG PO TABS: ORAL_TABLET | ORAL | 0 refills | Status: DC

## 2018-01-10 MED ORDER — PREDNISONE 10 MG PO TABS: ORAL_TABLET | ORAL | 0 refills | Status: DC

## 2018-01-10 NOTE — Assessment & Plan Note (Addendum)
-   Stable; O2 sat 99% 3L  - Not requiring additional oxygen

## 2018-01-10 NOTE — Progress Notes (Signed)
@Patient  ID: Margaretmary Lombard, female    DOB: 08-14-1945, 72 y.o.   MRN: [id number]  Chief Complaint  Patient presents with  . Acute Visit    fever, SOB with exertion, cough with white to yellow sputum, swelling, disturbed sleep x2  weeks    Referring provider: [name], DO  HPI: 72 year old female, former smoker quit 1999. 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 Hx left lobectomy. Patient of [name]. Followed by [name] with oncology, last seen on July 25th. Patient continues with Nivolumab 269m IV every 2 weeks.  Repeat CHEST CT W/contrast reviewed by Dr. MJulien Nordmannshowing no concerning findings for progression. There is a new RLL nodule 682m Recommended repeating every 3 months.  12/26/17 -ROV/Dr. ByLamonte Sakaihis follow-up visit for [age]ear old woman with severe COPD, stage III adenocarcinoma with residual right-sided disease, right pleural effusion, associated hypoxemic respiratory failure.  She also has hypertension with diastolic dysfunction.  She was hospitalized in late August 08/30-09/05 with progressive dyspnea, edema.  She was diuresed with improvement.  She was also treated empirically for possible exacerbation of COPD.  A right thoracentesis was done on 12/12/2017 and fluid appear to be most consistent with a transudate, culture negative, cytology negative. She reports that she is having some increased cough, nasal congestion, white sputum. She is on loratadine, omeprazole. She is on trelegy, uses albuterol nebs about 2x a day. Remains on lasix 4088maily, was started on lisinopril during the hospitalization. She finished her pred taper end of last week.   01/10/2018 Last seen on 09/16 for hospital follow-up for dyspnea and COPD exac. She was diuresed with improvement and is s/p  right thoracentesis. Started on [employer]isinopril change to valsartan 69m29mr Dr. ByruLamonte Sakainsideration for low dose daily prednisone.  Patient presents today for acute visit, complains of not feeling well. Called office on 09/23 with reports of fever 99.6 and productive cough with brown sputum. Started on Levaquin x 7 days. She is taking Levquin daily, has one day left. Feels more congested. Cough is mostly clear, started yellow today. Not coughing as much today, states its slowing down. Wheezing in the morning, Taking Allegra, Mucinex 1200mg65mce daily. Low grade temp 99.1, she's been taking tylenol. Doesn't have much of an appetite, making herself eat. Drinking fluids and staying hydrated. Had gatoraid last night. Feet have been swelling. Takes lasix 40mg 60my daily. Oxygen remains stable. Loose stool, not diarrhea. Denies urinary frequency, dysuria, N/V, abdominal pain. Rapid flu negative. Plan obtain stat CXR and labs   BP low today 100/50 (124/64). Plan hold Diovan, restart 1/2 tabs if >140/80.      Allergies  Allergen Reactions  . Iohexol Shortness Of Breath and Other (See Comments)    "burns me up inside"    Pt does fine with 13 hour prep  01/17/12  . Other     CAN'T STAND THE SMELL OF PURE BLEACH; "HAVE TO BACK UP AND POUR AND DON'T BREATH IT TIL i GET CAP BACK ON; DILUTE IT TO SPRAY"  . Anoro Ellipta [Umeclidinium-Vilanterol] Swelling    Swelling of lips and mouth  . Codeine Itching and Nausea And Vomiting    REACTION: GI upset  . Protonix [Pantoprazole Sodium] Diarrhea    Immunization History  Administered Date(s) Administered  . Influenza Split 02/08/2007, 03/19/2011, 01/27/2012  . Influenza Whole  01/02/2010  . Influenza, High Dose Seasonal PF 02/03/2017  . Influenza,inj,Quad PF,6+ Mos 12/27/2012, 02/12/2014, 01/22/2016  . Pneumococcal Conjugate-13 02/14/2017  . Pneumococcal Polysaccharide-23 03/27/2013  . Tdap 02/14/2017    Past Medical History:  Diagnosis Date  .  Adenocarcinoma, lung ([place])    upper left lobe  . Anemia in neoplastic disease   . Anxiety   . Arthritis    "all over my body"  . Asthma   . Asymptomatic varicose veins   . Atherosclerosis of native arteries of the extremities, unspecified   . Atrial fibrillation ([place])   . Bronchial pneumonia    history  . 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 Chemotherapy adverse reaction 09/15/11   "makes me light headed and pass out; this is 3rd blood transfusion since going thru it"  . CHF (congestive heart failure) ([place])   . Chronic airway obstruction, not elsewhere classified   . Chronic fatigue 08/19/2015  . Chronic sinusitis   . Complication of anesthesia    slow to wake up  . COPD with emphysema ([place])   . Coronary artery disease    Dr. Burt Knack had stress test done  . DDD (degenerative disc disease), lumbar   . Depressive disorder, not elsewhere classified   . Diseases of lips   . Disorder of bone and cartilage, unspecified   . DVT (deep venous thrombosis) ([place])   . Fibromyalgia   . Functional urinary incontinence   . GERD (gastroesophageal reflux disease)   . Hard of hearing, right   . Herpes zoster without mention of complication   . Hip pain, chronic 09/17/2014  . History of blood clots    "2 in my aorta"  . History of blood transfusion   . History of bronchitis   . History of echocardiogram    Echo 11/18: mild LVH, EF 60-65, no RWMA, Gr 1 DD, Ao Sclerosis, mild AI, RVH with low nl RSVF, trivial eff  . Hx of radiation therapy 04/28/11 -06/08/11   right upper lobe-lung  . Hyperlipidemia   . Hypertension   . Kidney infection    "related to chemo"  . Lumbago   . Malignant neoplasm of bronchus and lung, unspecified site   . Mental disorder   . Muscle weakness (generalized)   . Non-small cell lung cancer ([place]) 03/31/2011   Adenocarcinoma Rt upper lobe mass  . Osteoarthrosis, unspecified whether generalized or localized, unspecified site   . Osteoporosis   . Other abnormal blood  chemistry   . Other and unspecified hyperlipidemia   . PAD (peripheral artery disease) ([place])   . Pain in thoracic spine   . 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 Primary cancer of right upper lobe of lung ([place]) 02/08/2011   Biopsy Rt upper lobe mass 03/31/11>>adenocarcinoma DIAGNOSIS: Stage IIB/IIIA non-small cell lung cancer consistent with adenocarcinoma diagnosed in December 2012.   PRIOR THERAPY:  1) Status post radiation therapy to the right upper lobe lung mass completed on 06/04/2011 under the care of Dr. Pablo Ledger.  2) Systemic chemotherapy with carboplatin for AUC of 5 and Alimta 500 mg/M2 giving every 3   . Reflux   . Reflux esophagitis   . Respiratory failure with hypoxia 01/02/2010  . Restless legs syndrome (RLS)   . Scoliosis   . Screening for thyroid disorder   . Shortness of breath    "anytime really"  . Sinus headache    "all the time"  . Spasm of muscle   . Syncope  and collapse   . Type II or unspecified type diabetes mellitus with neurological manifestations, uncontrolled(250.62)   . Type II or unspecified type diabetes mellitus without mention of complication, not stated as uncontrolled   . Type II or unspecified type diabetes mellitus without mention of complication, not stated as uncontrolled   . Unspecified constipation   . Unspecified essential hypertension   . Unspecified hearing loss   . Unspecified hereditary and idiopathic peripheral neuropathy   . Unspecified vitamin D deficiency     Tobacco History: Social History   Tobacco Use  Smoking Status Former Smoker  . Packs/day: 1.00  . Years: 40.00  . Pack years: 40.00  . Types: Cigarettes  . Last attempt to quit: 02/10/1998  . Years since quitting: 19.9  Smokeless Tobacco Never Used   Counseling given: Not Answered   Outpatient Medications Prior to Visit  Medication Sig Dispense Refill  . ACCU-CHEK AVIVA PLUS test strip USE AS DIRECTED TO CHECK BLOOD SUGAR THREE TIMES DAILY 100 each 2  .  albuterol (PROVENTIL HFA;VENTOLIN HFA) 108 (90 Base) MCG/ACT inhaler Inhale 2 puffs into the lungs every 6 (six) hours as needed for wheezing or shortness of breath. 1 Inhaler 6  . albuterol (PROVENTIL) (2.5 MG/3ML) 0.083% nebulizer solution USE 1 VIAL VIA NEBULIZER EVERY 6 HOURS AS NEEDED FOR WHEEZING OR SHORTNESS OF BREATH (Patient taking differently: Take 2.5 mg by nebulization every 6 (six) hours as needed for wheezing or shortness of breath. ) 75 mL 3  . ALPRAZolam (XANAX) 1 MG tablet TAKE 1 TABLET BY MOUTH 3 TIMES A DAY AS NEEDED FOR ANXIETY 90 tablet 2  . aspirin 81 MG tablet Take 81 mg by mouth daily.    Marland Kitchen atorvastatin (LIPITOR) 20 MG tablet Take 20 mg by mouth daily.    . Blood Glucose Monitoring Suppl (ACCU-CHEK AVIVA PLUS) w/Device KIT Use as directed to check blood sugar Dx: E11.9, E11.49 1 kit 0  . Calcium Carbonate-Vit D-Min (CALTRATE PLUS PO) Take 1 tablet by mouth daily.    . fluticasone (FLONASE) 50 MCG/ACT nasal spray Place 2 sprays into both nostrils daily. 16 g 2  . furosemide (LASIX) 40 MG tablet Take 1 tablet (40 mg total) by mouth daily. 30 tablet 0  . Guaifenesin 1200 MG TB12 Take 1 tablet (1,200 mg total) by mouth 2 (two) times daily. 60 tablet 2  . HYDROcodone-acetaminophen (NORCO) 10-325 MG tablet Take 1 tablet by mouth 2 (two) times daily as needed. 60 tablet 0  . levofloxacin (LEVAQUIN) 500 MG tablet Take 1 tablet (500 mg total) by mouth daily. 7 tablet 0  . metFORMIN (GLUCOPHAGE) 500 MG tablet Take 1 tablet (500 mg total) by mouth 2 (two) times daily.    . metoprolol tartrate (LOPRESSOR) 25 MG tablet Take 1 tablet (25 mg total) by mouth 2 (two) times daily. 60 tablet 0  . omeprazole (PRILOSEC) 20 MG capsule Take 1 capsule (20 mg total) by mouth daily. 90 capsule 3  . OXYGEN Inhale 4 L into the lungs.    . potassium chloride (K-DUR,KLOR-CON) 10 MEQ tablet Take 1 tablet (10 mEq total) daily by mouth. 90 tablet 3  . Respiratory Therapy Supplies (FLUTTER) DEVI Use a  directed 1 each 0  . senna (SENOKOT) 8.6 MG tablet Take 1 tablet by mouth 2 (two) times daily as needed for constipation.     . traMADol (ULTRAM) 50 MG tablet TAKE 2 TABLETS (100MG TOTAL) BY MOUTH EVERY 6 HOURS AS NEEDED FOR SEVERE PAIN (  Patient taking differently: Take 100 mg by mouth every 6 (six) hours as needed for severe pain. ) 90 tablet 2  . TRELEGY ELLIPTA 100-62.5-25 MCG/INH AEPB INHALE 1 PUFF BY MOUTH ONCE DAILY (Patient taking differently: Inhale 1 puff into the lungs daily. ) 60 each 5  . valsartan (DIOVAN) 80 MG tablet Take 1 tablet (80 mg total) by mouth daily. 30 tablet 2   Facility-Administered Medications Prior to Visit  Medication Dose Route Frequency Provider Last Rate Last Dose  . hyaluronate sodium (RADIAPLEXRX) gel   Topical Once Thea Silversmith, MD      . topical emolient (BIAFINE) emulsion   Topical PRN Thea Silversmith, MD        Review of Systems  Review of Systems  Constitutional: Positive for appetite change and fever. Negative for activity change, chills, diaphoresis, fatigue and unexpected weight change.  Respiratory: Positive for cough and wheezing. Negative for chest tightness and shortness of breath.   Cardiovascular: Positive for leg swelling. Negative for chest pain and palpitations.  Skin: Negative.     Physical Exam  BP (!) 102/50   Pulse 98   Ht 5' 6"  (1.676 m)   Wt 167 lb 6.4 oz (75.9 kg)   SpO2 99%   BMI 27.02 kg/m  Physical Exam  Constitutional: She is oriented to person, place, and time. She appears well-developed and well-nourished. No distress.  Elderly female, chronically ill. Appears ok today.   HENT:  Head: Normocephalic and atraumatic.  Eyes: Pupils are equal, round, and reactive to light. EOM are normal.  Neck: Normal range of motion. Neck supple.  Cardiovascular: Normal rate and regular rhythm.  +1-2 BLE edema   Pulmonary/Chest: Effort normal.  Diminished right base. Diffuse insp wheeze. O2 sat 99% 3L. No resp distress or  accessory use.   Musculoskeletal:  In WC, gait no assessed  Neurological: She is alert and oriented to person, place, and time.  Skin: Skin is warm and dry.  Psychiatric: She has a normal mood and affect. Her behavior is normal. Judgment and thought content normal.     Lab Results:  CBC    Component Value Date/Time   WBC 7.8 01/10/2018 1146   RBC 3.27 (L) 01/10/2018 1146   HGB 9.6 (L) 01/10/2018 1146   HGB 11.8 11/01/2017 1309   HGB 12.7 04/07/2017 1015   HCT 28.6 (L) 01/10/2018 1146   HCT 39.0 04/07/2017 1015   PLT 343.0 01/10/2018 1146   PLT 266 11/01/2017 1309   PLT 203 04/07/2017 1015   MCV 87.2 01/10/2018 1146   MCV 90.1 04/07/2017 1015   MCH 28.0 12/15/2017 0434   MCHC 33.5 01/10/2018 1146   RDW 15.8 (H) 01/10/2018 1146   RDW 13.5 04/07/2017 1015   LYMPHSABS 1.1 01/10/2018 1146   LYMPHSABS 2.1 04/07/2017 1015   MONOABS 0.6 01/10/2018 1146   MONOABS 0.7 04/07/2017 1015   EOSABS 0.3 01/10/2018 1146   EOSABS 1.1 (H) 04/07/2017 1015   EOSABS 0.4 08/06/2015 1036   BASOSABS 0.1 01/10/2018 1146   BASOSABS 0.0 04/07/2017 1015    BMET    Component Value Date/Time   NA 138 01/10/2018 1146   NA 141 04/07/2017 1016   K 4.1 01/10/2018 1146   K 3.4 (L) 04/07/2017 1016   CL 100 01/10/2018 1146   CL 99 07/21/2012 1125   CO2 29 01/10/2018 1146   CO2 27 04/07/2017 1016   GLUCOSE 98 01/10/2018 1146   GLUCOSE 93 04/07/2017 1016   GLUCOSE 184 (H)  07/21/2012 1125   BUN 12 01/10/2018 1146   BUN 13.8 04/07/2017 1016   CREATININE 1.21 (H) 01/10/2018 1146   CREATININE 1.01 (H) 11/01/2017 1309   CREATININE 1.1 04/07/2017 1016   CALCIUM 9.2 01/10/2018 1146   CALCIUM 9.5 04/07/2017 1016   GFRNONAA 57 (L) 12/15/2017 0434   GFRNONAA 54 (L) 11/01/2017 1309   GFRAA >60 12/15/2017 0434   GFRAA >60 11/01/2017 1309    BNP    Component Value Date/Time   BNP 27.3 12/09/2017 1304    ProBNP    Component Value Date/Time   PROBNP 27.0 01/10/2018 1146    Imaging: Dg Chest 1  View  Result Date: 12/12/2017 CLINICAL DATA:  Status post right thoracentesis today. EXAM: CHEST  1 VIEW COMPARISON:  PA and lateral chest 12/09/2017 and 11/08/2017. FINDINGS: Right pleural effusion is markedly decreased after thoracentesis. No pneumothorax. Right upper lobe opacity seen on prior examinations is unchanged. Left lung is clear. Heart size is normal. IMPRESSION: Negative for pneumothorax after thoracentesis. No change in a masslike right upper lobe opacity. Electronically Signed   By: Inge Rise M.D.   On: 12/12/2017 10:28   Dg Chest 2 View  Result Date: 01/10/2018 CLINICAL DATA:  Shortness of breath.  Fever.  Edema. EXAM: CHEST - 2 VIEW COMPARISON:  12/13/2017. FINDINGS: Surgical clips and sutures noted over the chest. Heart size normal. Persistent right upper lung mass/consolidation right upper lung without significant change. Associated right hilar adenopathy may be present. Small right pleural effusion again noted. Mild left upper lobe subsegmental atelectasis. No pneumothorax. IMPRESSION: 1. Right upper lung mass/consolidation without significant change. Associated right hilar adenopathy may be present. Associated small right pleural effusion again noted. 2.  Mild left upper lobe subsegmental atelectasis. Electronically Signed   By: Marcello Moores  Register   On: 01/10/2018 12:35   Dg Chest 2 View  Result Date: 12/13/2017 CLINICAL DATA:  Lung carcinoma with shortness of breath EXAM: CHEST - 2 VIEW COMPARISON:  December 12, 2017 chest radiograph; chest CT November 10, 2017 FINDINGS: The previous consolidation with probable mass involving the axillary subsegment of the right upper lobe is again noted, similar to 1 day prior. There is postoperative change on the left, stable. No new opacity is evident. Heart size is normal. There is distortion of pulmonary vascularity on the right, stable. There is concern for adenopathy in the right perihilar region, stable. There are no blastic or lytic bone  lesions. Patient is status post kyphoplasty in the upper thoracic region. There is an adjacent wedge compression fracture in the upper thoracic region, stable. No pneumothorax. IMPRESSION: Similar appearance compared to 1 day prior. Apparent mass with consolidation on the right in the axillary segment right upper lobe region. Areas of volume loss bilaterally noted. Questionable right hilar adenopathy. Stable cardiac silhouette. No demonstrable pneumothorax. Electronically Signed   By: Lowella Grip III M.D.   On: 12/13/2017 08:15   US Thoracentesis Asp Pleural Space W/img Guide  Result Date: 12/12/2017 INDICATION: Shortness of breath. Lung cancer. Right-sided pleural effusion. Request diagnostic and therapeutic thoracentesis. EXAM: ULTRASOUND GUIDED RIGHT THORACENTESIS MEDICATIONS: None. COMPLICATIONS: None immediate. PROCEDURE: An ultrasound guided thoracentesis was thoroughly discussed with the patient and questions answered. The benefits, risks, alternatives and complications were also discussed. The patient understands and wishes to proceed with the procedure. Written consent was obtained. Ultrasound was performed to localize and mark an adequate pocket of fluid in the right chest. The area was then prepped and draped in the normal sterile  fashion. 1% Lidocaine was used for local anesthesia. Under ultrasound guidance a 6 Fr Safe-T-Centesis catheter was introduced. Thoracentesis was performed. The catheter was removed and a dressing applied. FINDINGS: A total of approximately 1.4 L of clear yellow fluid was removed. Samples were sent to the laboratory as requested by the clinical team. IMPRESSION: Successful ultrasound guided right thoracentesis yielding 1.4 L of pleural fluid. Read by: Ascencion Dike PA-C Electronically Signed   By: Jerilynn Mages.  Shick M.D.   On: 12/12/2017 10:09     Assessment & Plan:   Acute on chronic diastolic CHF (congestive heart failure) (HCC) - Stable; Pro-BNP 27 - Trace BLE edema -  Continue Lasix 4m daily   Pleural effusion - Stable; repeat CXR reviewed independently shows significant improvement in right pleural effusion s/p thoracentesis on 09/02.   Fever of unknown origin - Temp 99.1 today, recheck 98.6 - Rapid flu negative  - Checking UA, stool culture and sputum  - Tylenol as needed   COPD with acute exacerbation (HCC) - Cough with clear/white mucus, slowly improving. Wheezing in am.  - Extend Levaquin additional 3 days - Prednisone 236mx 5 days  - CXR showed right upper lung mass/consolidation without significant change. Associated right hilar adenopathy may be present. Associated small right pleural effusion again noted. Mild left upper lobe subsegmental atelectasis. - Labs essentially normal  - FU in 3 days   Respiratory failure with hypoxia (HCC) - Stable; O2 sat 99% 3L  - Not requiring additional oxygen   Essential hypertension - Started on Diovan 8075maily in September  - BP low normal today; 102/50 manual by provider  - Plan hold Diovan, if bp >140/80 resume at 5m26mCOPD (chronic obstructive pulmonary disease) (HCC)KendallContinues Trelegy, prn albuterol nebulizer q6hrs - Consider daily low dose prednisone at next visit     ElizMartyn Ehrich 01/10/2018

## 2018-01-10 NOTE — Assessment & Plan Note (Addendum)
-   Cough with clear/white mucus, slowly improving. Wheezing in am.  - Extend Levaquin additional 3 days - Prednisone 20mg  x 5 days  - CXR showed right upper lung mass/consolidation without significant change. Associated right hilar adenopathy may be present. Associated small right pleural effusion again noted. Mild left upper lobe subsegmental atelectasis. - Labs essentially normal  - FU in 3 days

## 2018-01-10 NOTE — Patient Instructions (Addendum)
Extend Levaquin for additional 3 days  Temp 98.6 recheck today  CXR looks stable, no re-accumulation of pleural effusion. Stable right upper lobe mass/consolidation  BP was 100/50, hold Diovan. If BP >140/80 restart Diovan at 40mg  (half tab)  Labs were normal Flu negative   Check urine, stool and sputum culture   FU on Friday with [name] or NP

## 2018-01-10 NOTE — Assessment & Plan Note (Addendum)
-   Temp 99.1 today, recheck 98.6 - Rapid flu negative  - Checking UA, stool culture and sputum  - Tylenol as needed

## 2018-01-10 NOTE — Assessment & Plan Note (Addendum)
-   Continues Trelegy, prn albuterol nebulizer q6hrs - Consider daily low dose prednisone at next visit

## 2018-01-10 NOTE — Assessment & Plan Note (Signed)
-   Started on Diovan 80mg  daily in September  - BP low normal today; 102/50 manual by provider  - Plan hold Diovan, if bp >140/80 resume at 40mg

## 2018-01-10 NOTE — Assessment & Plan Note (Addendum)
-   Stable; repeat CXR reviewed independently shows significant improvement in right pleural effusion s/p thoracentesis on 09/02.

## 2018-01-10 NOTE — Assessment & Plan Note (Signed)
-   Stable; Pro-BNP 27 - Trace BLE edema - Continue Lasix 40mg  daily

## 2018-01-11 ENCOUNTER — Telehealth: Payer: Self-pay | Admitting: Emergency Medicine

## 2018-01-11 ENCOUNTER — Ambulatory Visit: Payer: Medicare Other | Admitting: Emergency Medicine

## 2018-01-11 NOTE — Telephone Encounter (Signed)
Called [facility] and let them know we were waiting for a signature from MD which received today and Rodena Piety is faxing it back now.   Nothing further needed

## 2018-01-12 ENCOUNTER — Telehealth: Payer: Self-pay | Admitting: Emergency Medicine

## 2018-01-12 MED ORDER — HYDROCODONE-ACETAMINOPHEN 10-325 MG PO TABS: 1.0000 | ORAL_TABLET | Freq: Two times a day (BID) | ORAL | 0 refills | Status: DC | PRN

## 2018-01-12 NOTE — Telephone Encounter (Signed)
Called and spoke to patient verified pharmacy as the [facility] [address].   Patient in office tomorrow and will just pick up RX tomorrow during her OV.  Will print RX and give to [employer] and given to [place].  Nothing further needed.

## 2018-01-12 NOTE — Telephone Encounter (Signed)
Refill request for Norco 10/329m  Last OV: 01/10/2018 with BW Next OV: 01/13/2018 with RB  Last ordered by : RB Quantity: 60  Patient states she is taking 2-3 pills a day mostly 3 a day. [place] [facility] [place]  Allergies  Allergen Reactions  . Iohexol Shortness Of Breath and Other (See Comments)    "burns me up inside"    Pt does fine with 13 hour prep  01/17/12  . Other     CAN'T STAND THE SMELL OF PURE BLEACH; "HAVE TO BACK UP AND POUR AND DON'T BREATH IT TIL i GET CAP BACK ON; DILUTE IT TO SPRAY"  . Anoro Ellipta [Umeclidinium-Vilanterol] Swelling    Swelling of lips and mouth  . Codeine Itching and Nausea And Vomiting    REACTION: GI upset  . Protonix [Pantoprazole Sodium] Diarrhea    Current Outpatient Medications on File Prior to Visit  Medication Sig Dispense Refill  . ACCU-CHEK AVIVA PLUS test strip USE AS DIRECTED TO CHECK BLOOD SUGAR THREE TIMES DAILY 100 each 2  . albuterol (PROVENTIL HFA;VENTOLIN HFA) 108 (90 Base) MCG/ACT inhaler Inhale 2 puffs into the lungs every 6 (six) hours as needed for wheezing or shortness of breath. 1 Inhaler 6  . albuterol (PROVENTIL) (2.5 MG/3ML) 0.083% nebulizer solution USE 1 VIAL VIA NEBULIZER EVERY 6 HOURS AS NEEDED FOR WHEEZING OR SHORTNESS OF BREATH (Patient taking differently: Take 2.5 mg by nebulization every 6 (six) hours as needed for wheezing or shortness of breath. ) 75 mL 3  . ALPRAZolam (XANAX) 1 MG tablet TAKE 1 TABLET BY MOUTH 3 TIMES A DAY AS NEEDED FOR ANXIETY 90 tablet 2  . aspirin 81 MG tablet Take 81 mg by mouth daily.    .Marland Kitchenatorvastatin (LIPITOR) 20 MG tablet Take 20 mg by mouth daily.    . Blood Glucose Monitoring Suppl (ACCU-CHEK AVIVA PLUS) w/Device KIT Use as directed to check blood sugar Dx: E11.9, E11.49 1 kit 0  . Calcium Carbonate-Vit D-Min (CALTRATE PLUS PO) Take 1 tablet by mouth daily.    . fluticasone (FLONASE) 50 MCG/ACT nasal spray Place 2 sprays into both nostrils  daily. 16 g 2  . furosemide (LASIX) 40 MG tablet Take 1 tablet (40 mg total) by mouth daily. 30 tablet 0  . Guaifenesin 1200 MG TB12 Take 1 tablet (1,200 mg total) by mouth 2 (two) times daily. 60 tablet 2  . HYDROcodone-acetaminophen (NORCO) 10-325 MG tablet Take 1 tablet by mouth 2 (two) times daily as needed. 60 tablet 0  . levofloxacin (LEVAQUIN) 500 MG tablet Take 1 tablet (500 mg total) by mouth daily. 7 tablet 0  . levofloxacin (LEVAQUIN) 500 MG tablet 1 tab daily for additional 3 days 7 tablet 0  . metFORMIN (GLUCOPHAGE) 500 MG tablet Take 1 tablet (500 mg total) by mouth 2 (two) times daily.    . metoprolol tartrate (LOPRESSOR) 25 MG tablet Take 1 tablet (25 mg total) by mouth 2 (two) times daily. 60 tablet 0  . omeprazole (PRILOSEC) 20 MG capsule Take 1 capsule (20 mg total) by mouth daily. 90 capsule 3  . OXYGEN Inhale 4 L into the lungs.    . potassium chloride (K-DUR,KLOR-CON) 10 MEQ tablet Take 1 tablet (10 mEq total) daily by mouth. 90 tablet 3  . predniSONE (DELTASONE) 10 MG tablet Take 2 tabs daily x 5 days 10 tablet 0  . Respiratory Therapy Supplies (FLUTTER) DEVI Use a directed 1  each 0  . senna (SENOKOT) 8.6 MG tablet Take 1 tablet by mouth 2 (two) times daily as needed for constipation.     . traMADol (ULTRAM) 50 MG tablet TAKE 2 TABLETS (100MG TOTAL) BY MOUTH EVERY 6 HOURS AS NEEDED FOR SEVERE PAIN (Patient taking differently: Take 100 mg by mouth every 6 (six) hours as needed for severe pain. ) 90 tablet 2  . TRELEGY ELLIPTA 100-62.5-25 MCG/INH AEPB INHALE 1 PUFF BY MOUTH ONCE DAILY (Patient taking differently: Inhale 1 puff into the lungs daily. ) 60 each 5  . valsartan (DIOVAN) 80 MG tablet Take 1 tablet (80 mg total) by mouth daily. 30 tablet 2  . [DISCONTINUED] calcium carbonate 200 MG capsule Take 250 mg by mouth daily. Does not take everyday     Current Facility-Administered Medications on File Prior to Visit  Medication Dose Route Frequency Provider Last Rate Last  Dose  . hyaluronate sodium (RADIAPLEXRX) gel   Topical Once Thea Silversmith, MD      . topical emolient (BIAFINE) emulsion   Topical PRN Thea Silversmith, MD

## 2018-01-12 NOTE — Telephone Encounter (Signed)
OK to fill

## 2018-01-13 ENCOUNTER — Ambulatory Visit (INDEPENDENT_AMBULATORY_CARE_PROVIDER_SITE_OTHER): Payer: Medicare Other | Admitting: Primary Care

## 2018-01-13 ENCOUNTER — Encounter: Payer: Self-pay | Admitting: Primary Care

## 2018-01-13 ENCOUNTER — Ambulatory Visit: Payer: Medicare Other | Admitting: Physician Assistant

## 2018-01-13 ENCOUNTER — Other Ambulatory Visit (INDEPENDENT_AMBULATORY_CARE_PROVIDER_SITE_OTHER): Payer: Medicare Other

## 2018-01-13 VITALS — BP 122/74 | Ht 66.0 in | Wt 166.0 lb

## 2018-01-13 DIAGNOSIS — J441 Chronic obstructive pulmonary disease with (acute) exacerbation: Secondary | ICD-10-CM

## 2018-01-13 DIAGNOSIS — R509 Fever, unspecified: Secondary | ICD-10-CM

## 2018-01-13 DIAGNOSIS — I10 Essential (primary) hypertension: Secondary | ICD-10-CM | POA: Diagnosis not present

## 2018-01-13 DIAGNOSIS — C3411 Malignant neoplasm of upper lobe, right bronchus or lung: Secondary | ICD-10-CM | POA: Diagnosis not present

## 2018-01-13 DIAGNOSIS — J449 Chronic obstructive pulmonary disease, unspecified: Secondary | ICD-10-CM

## 2018-01-13 LAB — URINALYSIS
Bilirubin Urine: NEGATIVE
KETONES UR: NEGATIVE
Leukocytes, UA: NEGATIVE
Nitrite: NEGATIVE
SPECIFIC GRAVITY, URINE: 1.01 (ref 1.000–1.030)
Total Protein, Urine: NEGATIVE
URINE GLUCOSE: NEGATIVE
UROBILINOGEN UA: 0.2 (ref 0.0–1.0)
pH: 5.5 (ref 5.0–8.0)

## 2018-01-13 MED ORDER — PREDNISONE 10 MG PO TABS: 10.0000 mg | ORAL_TABLET | Freq: Every day | ORAL | 6 refills | Status: AC

## 2018-01-13 MED ORDER — VALSARTAN 40 MG PO TABS: 40.0000 mg | ORAL_TABLET | Freq: Every day | ORAL | 6 refills | Status: DC

## 2018-01-13 NOTE — Assessment & Plan Note (Signed)
-  Tramadol 60mg  q6hrs prn for moderate pain - Norco q12hours prn for severe pain

## 2018-01-13 NOTE — Assessment & Plan Note (Signed)
-   Adding daily prednisone 10mg   - Continue Trelegy  - Albuterol nebulizer q6 hours prn sob/wheezing

## 2018-01-13 NOTE — Assessment & Plan Note (Signed)
-   Improved: BP 122/74 - Continue lower dose of Diovan (40mg )

## 2018-01-13 NOTE — Assessment & Plan Note (Addendum)
-   Symptoms appear largely viral/allergic sinusitis  - No signs of bacterial infection today - Sinus CT 02/02/2011 Clear paranasal sinuse - Encourage mucinex, antihistamine and nasal irrigation

## 2018-01-13 NOTE — Patient Instructions (Addendum)
  Starting you on daily prednisone   Try wearing compression stockings  Nasal ocean spray or irrigation  Continue daily antihistamine (zyrtec or Claritin)  Palliative care consult re: COPD  FU in 4 weeks with [name] or Eustaquio Maize NP  *Will mail med sheet next week

## 2018-01-13 NOTE — Progress Notes (Signed)
_0  ID: [name], female    DOB: 09/08/1945, 72 y.o.   MRN: [id number]  Chief Complaint  Patient presents with  . Follow-up    COPD >> Breathing is worse since last OV. Reports increased SOB, increased fatigue and leg edema.     Referring provider: [name], DO  HPI:  72 year old female, former smoker quit 1999. 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 Hx left lobectomy. Patient of [name]. Followed by [name] with oncology, last seen on July 25th. Patient continues with Nivolumab 273m IV every 2 weeks.  Repeat CHEST CT W/contrast reviewed by Dr. MJulien Nordmannshowing no concerning findings for progression. There is a new RLL nodule 656m Recommended repeating every 3 months.  12/26/17 -ROV/Dr. ByLamonte Sakaihis follow-up visit for [age]ear old woman with severe COPD, stage III adenocarcinoma with residual right-sided disease, right pleural effusion, associated hypoxemic respiratory failure.  She also has hypertension with diastolic dysfunction.  She was hospitalized in late August 08/30-09/05 with progressive dyspnea, edema.  She was diuresed with improvement.  She was also treated empirically for possible exacerbation of COPD.  A right thoracentesis was done on 12/12/2017 and fluid appear to be most consistent with a transudate, culture negative, cytology negative. She reports that she is having some increased cough, nasal congestion, white sputum. She is on loratadine, omeprazole. She is on trelegy, uses albuterol nebs about 2x a day. Remains on lasix 4038maily, was started on lisinopril during the hospitalization. She finished her pred taper end of last week.   01/10/2018 Last seen on 09/16 for hospital follow-up for dyspnea and COPD exac. She was diuresed with improvement and is s/p  right thoracentesis. Started on All[employer] Corporationisinopril change to valsartan 84m57mr Dr. ByruLamonte Sakainsideration for low dose daily prednisone.  Patient presents today for acute visit, complains of not feeling well. Called office on 09/23 with reports of fever 99.6 and productive cough with brown sputum. Started on Levaquin x 7 days. She is taking Levquin daily, has one day left. Feels more congested. Cough is mostly clear, started yellow today. Not coughing as much today, states its slowing down. Wheezing in the morning, Taking Allegra, Mucinex 1200mg47mce daily. Low grade temp 99.1, she's been taking tylenol. Doesn't have much of an appetite, making herself eat. Drinking fluids and staying hydrated. Had gatoraid last night. Feet have been swelling. Takes lasix 40mg 15my daily. Oxygen remains stable. Loose stool, not diarrhea. Denies urinary frequency, dysuria, N/V, abdominal pain. Rapid flu negative. Plan obtain stat CXR and labs. BP 100/50 (124/64). Plan hold Diovan, restart 1/2 tabs if >140/80.    01/13/2018 Patient presents today for 3 days follow-up visit for COPD exacerbation. CXR showed right upper lung mass/consolidation without significant change. Associated right hilar adenopathy may be present. Associated small right pleural effusion again noted. Mild left upper lobe subsegmental atelectasis. Rapid flu negative. Labs were normal. Levaquin extended for 3 additional days and started prednisone 20mg x14mays. Plan to consider placing patient on daily prednisone, discussed with Dr. Byrum. Lamonte Sakai00/50 three days ago, holding diovan. Consider restarting at lower dose.    Patient still complains of sob. Reports that she has been having panic attacks. Takes xanax 1mg to 22mep and half tab prn. Difficulty managing her medications, looking for an easy medication chart to follow. Anxiety is driven by  her fear of not being in control and knowing she is not getting better. Complains of arthritic pain and  fibromyalgia, takes Tramadol prn and if that does not help will take Norco pain medication. Continues to have some leg swelling, states that she has to keep feet elevated. Reports that she has been taking 56m of lasix. Has not been wearig compression stockings. Aid comes at 11am and can help her put stockings on. Lots of nasal congestion and PND. Lung sounds mostly clear. Occ insp wheeze. Breathing is not labored at rest. Tearful today.     Allergies  Allergen Reactions  . Iohexol Shortness Of Breath and Other (See Comments)    "burns me up inside"    Pt does fine with 13 hour prep  01/17/12  . Other     CAN'T STAND THE SMELL OF PURE BLEACH; "HAVE TO BACK UP AND POUR AND DON'T BREATH IT TIL i GET CAP BACK ON; DILUTE IT TO SPRAY"  . Anoro Ellipta [Umeclidinium-Vilanterol] Swelling    Swelling of lips and mouth  . Codeine Itching and Nausea And Vomiting    REACTION: GI upset  . Protonix [Pantoprazole Sodium] Diarrhea    Immunization History  Administered Date(s) Administered  . Influenza Split 02/08/2007, 03/19/2011, 01/27/2012  . Influenza Whole 01/02/2010  . Influenza, High Dose Seasonal PF 02/03/2017  . Influenza,inj,Quad PF,6+ Mos 12/27/2012, 02/12/2014, 01/22/2016  . Pneumococcal Conjugate-13 02/14/2017  . Pneumococcal Polysaccharide-23 03/27/2013  . Tdap 02/14/2017    Past Medical History:  Diagnosis Date  . Adenocarcinoma, lung ([place]    upper left lobe  . Anemia in neoplastic disease   . Anxiety   . Arthritis    "all over my body"  . Asthma   . Asymptomatic varicose veins   . Atherosclerosis of native arteries of the extremities, unspecified   . Atrial fibrillation ([place]   . Bronchial pneumonia    history  . 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 Chemotherapy adverse reaction 09/15/11   "makes me light headed and pass out; this is 3rd blood transfusion since going thru it"  . CHF (congestive heart failure) ([place]   . Chronic airway obstruction, not elsewhere  classified   . Chronic fatigue 08/19/2015  . Chronic sinusitis   . Complication of anesthesia    slow to wake up  . COPD with emphysema ([place]   . Coronary artery disease    Dr. CBurt Knackhad stress test done  . DDD (degenerative disc disease), lumbar   . Depressive disorder, not elsewhere classified   . Diseases of lips   . Disorder of bone and cartilage, unspecified   . DVT (deep venous thrombosis) ([place]   . Fibromyalgia   . Functional urinary incontinence   . GERD (gastroesophageal reflux disease)   . Hard of hearing, right   . Herpes zoster without mention of complication   . Hip pain, chronic 09/17/2014  . History of blood clots    "2 in my aorta"  . History of blood transfusion   . History of bronchitis   . History of echocardiogram    Echo 11/18: mild LVH, EF 60-65, no RWMA, Gr 1 DD, Ao Sclerosis, mild AI, RVH with low nl RSVF, trivial eff  . Hx of radiation therapy 04/28/11 -06/08/11   right upper lobe-lung  . Hyperlipidemia   . Hypertension   . Kidney infection    "related to chemo"  . Lumbago   . Malignant neoplasm of bronchus and lung,  unspecified site   . Mental disorder   . Muscle weakness (generalized)   . Non-small cell lung cancer ([place]) 03/31/2011   Adenocarcinoma Rt upper lobe mass  . Osteoarthrosis, unspecified whether generalized or localized, unspecified site   . Osteoporosis   . Other abnormal blood chemistry   . Other and unspecified hyperlipidemia   . PAD (peripheral artery disease) ([place])   . Pain in thoracic spine   . 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 Primary cancer of right upper lobe of lung ([place]) 02/08/2011   Biopsy Rt upper lobe mass 03/31/11>>adenocarcinoma DIAGNOSIS: Stage IIB/IIIA non-small cell lung cancer consistent with adenocarcinoma diagnosed in December 2012.   PRIOR THERAPY:  1) Status post radiation therapy to the right upper lobe lung mass completed on 06/04/2011 under the care of Dr. Pablo Ledger.  2) Systemic chemotherapy with  carboplatin for AUC of 5 and Alimta 500 mg/M2 giving every 3   . Reflux   . Reflux esophagitis   . Respiratory failure with hypoxia 01/02/2010  . Restless legs syndrome (RLS)   . Scoliosis   . Screening for thyroid disorder   . Shortness of breath    "anytime really"  . Sinus headache    "all the time"  . Spasm of muscle   . Syncope and collapse   . Type II or unspecified type diabetes mellitus with neurological manifestations, uncontrolled(250.62)   . Type II or unspecified type diabetes mellitus without mention of complication, not stated as uncontrolled   . Type II or unspecified type diabetes mellitus without mention of complication, not stated as uncontrolled   . Unspecified constipation   . Unspecified essential hypertension   . Unspecified hearing loss   . Unspecified hereditary and idiopathic peripheral neuropathy   . Unspecified vitamin D deficiency     Tobacco History: Social History   Tobacco Use  Smoking Status Former Smoker  . Packs/day: 1.00  . Years: 40.00  . Pack years: 40.00  . Types: Cigarettes  . Last attempt to quit: 02/10/1998  . Years since quitting: 19.9  Smokeless Tobacco Never Used   Counseling given: Not Answered   Outpatient Medications Prior to Visit  Medication Sig Dispense Refill  . ACCU-CHEK AVIVA PLUS test strip USE AS DIRECTED TO CHECK BLOOD SUGAR THREE TIMES DAILY 100 each 2  . albuterol (PROVENTIL HFA;VENTOLIN HFA) 108 (90 Base) MCG/ACT inhaler Inhale 2 puffs into the lungs every 6 (six) hours as needed for wheezing or shortness of breath. 1 Inhaler 6  . albuterol (PROVENTIL) (2.5 MG/3ML) 0.083% nebulizer solution USE 1 VIAL VIA NEBULIZER EVERY 6 HOURS AS NEEDED FOR WHEEZING OR SHORTNESS OF BREATH (Patient taking differently: Take 2.5 mg by nebulization every 6 (six) hours as needed for wheezing or shortness of breath. ) 75 mL 3  . ALPRAZolam (XANAX) 1 MG tablet TAKE 1 TABLET BY MOUTH 3 TIMES A DAY AS NEEDED FOR ANXIETY 90 tablet 2  .  aspirin 81 MG tablet Take 81 mg by mouth daily.    Marland Kitchen atorvastatin (LIPITOR) 20 MG tablet Take 20 mg by mouth daily.    . Blood Glucose Monitoring Suppl (ACCU-CHEK AVIVA PLUS) w/Device KIT Use as directed to check blood sugar Dx: E11.9, E11.49 1 kit 0  . Calcium Carbonate-Vit D-Min (CALTRATE PLUS PO) Take 1 tablet by mouth daily.    . furosemide (LASIX) 40 MG tablet Take 1 tablet (40 mg total) by mouth daily. 30 tablet 0  . Guaifenesin 1200 MG TB12 Take 1 tablet (1,200 mg  total) by mouth 2 (two) times daily. 60 tablet 2  . HYDROcodone-acetaminophen (NORCO) 10-325 MG tablet Take 1 tablet by mouth 2 (two) times daily as needed. 60 tablet 0  . metFORMIN (GLUCOPHAGE) 500 MG tablet Take 1 tablet (500 mg total) by mouth 2 (two) times daily.    . metoprolol tartrate (LOPRESSOR) 25 MG tablet Take 1 tablet (25 mg total) by mouth 2 (two) times daily. 60 tablet 0  . omeprazole (PRILOSEC) 20 MG capsule Take 1 capsule (20 mg total) by mouth daily. 90 capsule 3  . OXYGEN Inhale 4 L into the lungs.    . potassium chloride (K-DUR,KLOR-CON) 10 MEQ tablet Take 1 tablet (10 mEq total) daily by mouth. 90 tablet 3  . Respiratory Therapy Supplies (FLUTTER) DEVI Use a directed 1 each 0  . senna (SENOKOT) 8.6 MG tablet Take 1 tablet by mouth 2 (two) times daily as needed for constipation.     . traMADol (ULTRAM) 50 MG tablet TAKE 2 TABLETS (100MG TOTAL) BY MOUTH EVERY 6 HOURS AS NEEDED FOR SEVERE PAIN (Patient taking differently: Take 100 mg by mouth every 6 (six) hours as needed for severe pain. ) 90 tablet 2  . TRELEGY ELLIPTA 100-62.5-25 MCG/INH AEPB INHALE 1 PUFF BY MOUTH ONCE DAILY (Patient taking differently: Inhale 1 puff into the lungs daily. ) 60 each 5  . fluticasone (FLONASE) 50 MCG/ACT nasal spray Place 2 sprays into both nostrils daily. 16 g 2  . levofloxacin (LEVAQUIN) 500 MG tablet Take 1 tablet (500 mg total) by mouth daily. 7 tablet 0  . levofloxacin (LEVAQUIN) 500 MG tablet 1 tab daily for additional 3  days 7 tablet 0  . predniSONE (DELTASONE) 10 MG tablet Take 2 tabs daily x 5 days 10 tablet 0  . valsartan (DIOVAN) 80 MG tablet Take 1 tablet (80 mg total) by mouth daily. 30 tablet 2   Facility-Administered Medications Prior to Visit  Medication Dose Route Frequency Provider Last Rate Last Dose  . hyaluronate sodium (RADIAPLEXRX) gel   Topical Once Thea Silversmith, MD      . topical emolient (BIAFINE) emulsion   Topical PRN Thea Silversmith, MD        Review of Systems  Review of Systems  Constitutional: Negative.   HENT: Positive for congestion and postnasal drip.   Respiratory: Positive for shortness of breath.   Cardiovascular: Positive for leg swelling. Negative for chest pain and palpitations.  Musculoskeletal: Positive for arthralgias.  Psychiatric/Behavioral: Positive for sleep disturbance. The patient is nervous/anxious.    Physical Exam  BP 122/74 (BP Location: Left Arm, Cuff Size: Normal)   Ht _0  (1.676 m)   Wt 166 lb (75.3 kg)   SpO2 97%   BMI 26.79 kg/m  Physical Exam  Constitutional: She is oriented to person, place, and time. She appears well-developed and well-nourished. No distress.  Chronically ill appearing, no acute distress.   HENT:  Head: Normocephalic and atraumatic.  Eyes: Pupils are equal, round, and reactive to light. EOM are normal.  Neck: Normal range of motion. Neck supple.  Cardiovascular: Normal rate and regular rhythm.  +1 BLE edema   Pulmonary/Chest: Effort normal. No respiratory distress. She has wheezes. She has no rales.  Lung sounds mostly clear, occ insp wheeze.   Musculoskeletal: Normal range of motion.  Neurological: She is alert and oriented to person, place, and time.  Skin: Skin is warm and dry.  Psychiatric: Thought content normal.  Anxious/tearful. Forgetful      Lab  Results:  CBC    Component Value Date/Time   WBC 7.8 01/10/2018 1146   RBC 3.27 (L) 01/10/2018 1146   HGB 9.6 (L) 01/10/2018 1146   HGB 11.8  11/01/2017 1309   HGB 12.7 04/07/2017 1015   HCT 28.6 (L) 01/10/2018 1146   HCT 39.0 04/07/2017 1015   PLT 343.0 01/10/2018 1146   PLT 266 11/01/2017 1309   PLT 203 04/07/2017 1015   MCV 87.2 01/10/2018 1146   MCV 90.1 04/07/2017 1015   MCH 28.0 12/15/2017 0434   MCHC 33.5 01/10/2018 1146   RDW 15.8 (H) 01/10/2018 1146   RDW 13.5 04/07/2017 1015   LYMPHSABS 1.1 01/10/2018 1146   LYMPHSABS 2.1 04/07/2017 1015   MONOABS 0.6 01/10/2018 1146   MONOABS 0.7 04/07/2017 1015   EOSABS 0.3 01/10/2018 1146   EOSABS 1.1 (H) 04/07/2017 1015   EOSABS 0.4 08/06/2015 1036   BASOSABS 0.1 01/10/2018 1146   BASOSABS 0.0 04/07/2017 1015    BMET    Component Value Date/Time   NA 138 01/10/2018 1146   NA 141 04/07/2017 1016   K 4.1 01/10/2018 1146   K 3.4 (L) 04/07/2017 1016   CL 100 01/10/2018 1146   CL 99 07/21/2012 1125   CO2 29 01/10/2018 1146   CO2 27 04/07/2017 1016   GLUCOSE 98 01/10/2018 1146   GLUCOSE 93 04/07/2017 1016   GLUCOSE 184 (H) 07/21/2012 1125   BUN 12 01/10/2018 1146   BUN 13.8 04/07/2017 1016   CREATININE 1.21 (H) 01/10/2018 1146   CREATININE 1.01 (H) 11/01/2017 1309   CREATININE 1.1 04/07/2017 1016   CALCIUM 9.2 01/10/2018 1146   CALCIUM 9.5 04/07/2017 1016   GFRNONAA 57 (L) 12/15/2017 0434   GFRNONAA 54 (L) 11/01/2017 1309   GFRAA >60 12/15/2017 0434   GFRAA >60 11/01/2017 1309    BNP    Component Value Date/Time   BNP 27.3 12/09/2017 1304    ProBNP    Component Value Date/Time   PROBNP 27.0 01/10/2018 1146    Imaging: Dg Chest 2 View  Result Date: 01/10/2018 CLINICAL DATA:  Shortness of breath.  Fever.  Edema. EXAM: CHEST - 2 VIEW COMPARISON:  12/13/2017. FINDINGS: Surgical clips and sutures noted over the chest. Heart size normal. Persistent right upper lung mass/consolidation right upper lung without significant change. Associated right hilar adenopathy may be present. Small right pleural effusion again noted. Mild left upper lobe subsegmental  atelectasis. No pneumothorax. IMPRESSION: 1. Right upper lung mass/consolidation without significant change. Associated right hilar adenopathy may be present. Associated small right pleural effusion again noted. 2.  Mild left upper lobe subsegmental atelectasis. Electronically Signed   By: Marcello Moores  Register   On: 01/10/2018 12:35     Assessment & Plan:   Shortness of breath is largely anxiety driven. Patient has severe COPD and recurrent non-small cell lung cancer. Her breathing has been declining over the last few months and she knows she is not getting better. Maintained on Trelegy, prn albuterol nebulizer. Will add daily prednisone 35m to therapy to help. Discussed palliative care at length, patient is agreeing to consult and Dr. BLamonte Sakaiis on board with plan.   COPD (chronic obstructive pulmonary disease) (HCC) - Adding daily prednisone 131m - Continue Trelegy  - Albuterol nebulizer q6 hours prn sob/wheezing   Primary cancer of right upper lobe of lung (HCC) - Hx stage IIB/IIIA non-small cell lung cancer December 2012, s/p chemo with carboplatin and Alimta x 2 cycles. Treatment initiated with nivolumab  08/27/15 - Currently on observation. Patient had been on immunotherapy but with her other significant medical issues she was not tolerating  - Recent decline over the last few months, breathing has worsened  - Refer to palliative care for consult   Essential hypertension - Improved: BP 122/74 - Continue lower dose of Diovan (76m)  Chronic pain -Tramadol [id number] prn for moderate pain - Norco q12hours prn for severe pain   Sinusitis, chronic - Symptoms appear largely viral/allergic sinusitis  - No signs of bacterial infection today - Sinus CT 02/02/2011 Clear paranasal sinuse - Encourage mucinex, antihistamine and nasal irrigation   >4544m spent on case, 50% of that time face-to-face with patient   EliMartyn EhrichP 01/13/2018

## 2018-01-13 NOTE — Assessment & Plan Note (Addendum)
-   Hx stage IIB/IIIA non-small cell lung cancer December 2012, s/p chemo with carboplatin and Alimta x 2 cycles. Treatment initiated with nivolumab 08/27/15 - Currently on observation. Patient had been on immunotherapy but with her other significant medical issues she was not tolerating  - Recent decline over the last few months, breathing has worsened  - Refer to palliative care for consult

## 2018-01-15 LAB — URINE CULTURE
MICRO NUMBER: 91195680
SPECIMEN QUALITY:: ADEQUATE

## 2018-01-16 ENCOUNTER — Other Ambulatory Visit: Payer: Self-pay

## 2018-01-16 ENCOUNTER — Telehealth: Payer: Self-pay | Admitting: Emergency Medicine

## 2018-01-16 LAB — RESPIRATORY CULTURE OR RESPIRATORY AND SPUTUM CULTURE
MICRO NUMBER:: 91195745
RESULT: NORMAL
SPECIMEN QUALITY:: ADEQUATE

## 2018-01-16 MED ORDER — ONDANSETRON HCL 4 MG PO TABS: 4.0000 mg | ORAL_TABLET | Freq: Three times a day (TID) | ORAL | 1 refills | Status: AC | PRN

## 2018-01-16 NOTE — Telephone Encounter (Signed)
Called and spoke to patient, she states she is nauseated and the little things she does eat she is unable to keep them down. Denies any respiratory symptoms. States she has lost her appetite. Patient was informed [name] is not in the office and since she is not having in respiratory symptoms she should follow up with her PCP. Nothing further is needed at this time.

## 2018-01-22 NOTE — Progress Notes (Signed)
Cardiology Office Note:    Date:  01/23/2018   ID:  [name], [name] 1945-10-04, MRN [id number]  PCP:  [name], DO  Cardiologist:  Sherren Mocha, MD  Electrophysiologist:  None  Oncologist: [name] Pulmonologist: [name]   Referring MD: [name], MD   Chief Complaint  Patient presents with  . Hospitalization Follow-up    CHF    History of Present Illness:    [name] is a 72 y.o. 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  She was last seen in clinic in 2018.  She was admitted 8/30-9/5 with acute on chronic respiratory failure secondary to acute diastolic congestive heart failure and COPD exacerbation complicated by R pleural effusion.  She required IV diuresis and R thoracentesis.  Venous dopplers were neg for DVT.  She has been seen several times by Pulmonology since discharge for worsening respiratory symptoms.  She was referred to palliative care.    [name] returns for follow-up.  She is here with her daughter.  She started having diarrhea around the time of discharge from the hospital.  This seems to be intermittent.  She denies any bloody stools.  She has had some episodes of chest discomfort.  These are left-sided.  They occur with no activity.  She notes significant lower extremity swelling.  Her daughter thinks this may have improved somewhat since discharge from the hospital.  She notes paroxysmal nocturnal dyspnea as well as orthopnea.  Of note, she had several medication changes in the hospital.  Diltiazem was stopped and she was placed on metoprolol.  I assume the diltiazem was stopped due to lower extremity swelling.  Prior CV studies:   The following studies were reviewed today:  Echo 12/11/17 EF 60-65, no RWMA, Gr 1 DD, trivial AI, PASP 33  Echo 02/18/17 Mild  LVH, EF 60-65, no RWMA, Gr 1 DD, Ao sclerosis without stenosis, mild AI, trivial MR, RVH, noremal RVSF, trivial effusion  Echo 7/15 EF 60-65, normal wall motion, grade 1 diastolic dysfunction, mild AI, mild LAE  Cardiac MRI 01/2011 Impression: 1) Small posterior pericardial effusion 2) Large epicardial fat pad 3) Normal EF 62% 4) No hyperenhancement 5) Mild LAE 6) Suspicious 3.5cm lesion in the RUL concerning for lung caner  Nuclear stress test 01/2011 Probably normal stress nuclear study with a small fixed anterior defect suggestive of breast attenuation but no ischemia. EF 89  Cardiac Catheterization 01/2005 Vigorous LVF  LAD 30-50 LCx irregularities  Past Medical History:  Diagnosis Date  . Adenocarcinoma, lung ([place])    upper left lobe  . Anemia in neoplastic disease   . Anxiety   . Arthritis    "all over my body"  . Asthma   . Asymptomatic varicose veins   . Atherosclerosis of native arteries of the extremities, unspecified   . Atrial fibrillation ([place])   . Bronchial pneumonia    history  . 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 Chemotherapy adverse reaction 09/15/11   "makes me light headed and pass out; this is 3rd blood transfusion since going thru it"  . CHF (congestive heart failure) ([place])   . Chronic airway obstruction, not elsewhere classified   . Chronic fatigue 08/19/2015  . Chronic sinusitis   . Complication of anesthesia    slow to wake up  . COPD with emphysema (  [place])   . Coronary artery disease    Dr. Burt Knack had stress test done  . DDD (degenerative disc disease), lumbar   . Depressive disorder, not elsewhere classified   . Diseases of lips   . Disorder of bone and cartilage, unspecified   . DVT (deep venous thrombosis) ([place])   . Fibromyalgia   . Functional urinary incontinence   . GERD (gastroesophageal reflux disease)   . Hard of hearing, right   . Herpes zoster without mention of complication   . Hip pain,  chronic 09/17/2014  . History of blood clots    "2 in my aorta"  . History of blood transfusion   . History of bronchitis   . History of echocardiogram    Echo 11/18: mild LVH, EF 60-65, no RWMA, Gr 1 DD, Ao Sclerosis, mild AI, RVH with low nl RSVF, trivial eff  . Hx of radiation therapy 04/28/11 -06/08/11   right upper lobe-lung  . Hyperlipidemia   . Hypertension   . Kidney infection    "related to chemo"  . Lumbago   . Malignant neoplasm of bronchus and lung, unspecified site   . Mental disorder   . Muscle weakness (generalized)   . Non-small cell lung cancer ([place]) 03/31/2011   Adenocarcinoma Rt upper lobe mass  . Osteoarthrosis, unspecified whether generalized or localized, unspecified site   . Osteoporosis   . Other abnormal blood chemistry   . Other and unspecified hyperlipidemia   . PAD (peripheral artery disease) ([place])   . Pain in thoracic spine   . 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 Primary cancer of right upper lobe of lung ([place]) 02/08/2011   Biopsy Rt upper lobe mass 03/31/11>>adenocarcinoma DIAGNOSIS: Stage IIB/IIIA non-small cell lung cancer consistent with adenocarcinoma diagnosed in December 2012.   PRIOR THERAPY:  1) Status post radiation therapy to the right upper lobe lung mass completed on 06/04/2011 under the care of Dr. Pablo Ledger.  2) Systemic chemotherapy with carboplatin for AUC of 5 and Alimta 500 mg/M2 giving every 3   . Reflux   . Reflux esophagitis   . Respiratory failure with hypoxia 01/02/2010  . Restless legs syndrome (RLS)   . Scoliosis   . Screening for thyroid disorder   . Shortness of breath    "anytime really"  . Sinus headache    "all the time"  . Spasm of muscle   . Syncope and collapse   . Type II or unspecified type diabetes mellitus with neurological manifestations, uncontrolled(250.62)   . Type II or unspecified type diabetes mellitus without mention of complication, not stated as uncontrolled   . Type II or unspecified type diabetes  mellitus without mention of complication, not stated as uncontrolled   . Unspecified constipation   . Unspecified essential hypertension   . Unspecified hearing loss   . Unspecified hereditary and idiopathic peripheral neuropathy   . Unspecified vitamin D deficiency    Surgical Hx: The patient  has a past surgical history that includes Lung lobectomy (1986); Septoplasty; Total hip arthroplasty (01/2010); Bronchoscopy (02/2011); Dilation and curettage of uterus (1980's); Tubal ligation (1980's); Cardiac catheterization; Fetal blood transfusion (09/16/11); Joint replacement (04/13/10); Cataract extraction (03/12/2013); and Eye surgery (Left, 2014).   Current Medications: Current Meds  Medication Sig  . ACCU-CHEK AVIVA PLUS test strip USE AS DIRECTED TO CHECK BLOOD SUGAR THREE TIMES DAILY  . albuterol (PROVENTIL HFA;VENTOLIN HFA) 108 (90 Base) MCG/ACT inhaler Inhale 2 puffs into the lungs every 6 (six) hours as needed  for wheezing or shortness of breath.  Marland Kitchen albuterol (PROVENTIL) (2.5 MG/3ML) 0.083% nebulizer solution USE 1 VIAL VIA NEBULIZER EVERY 6 HOURS AS NEEDED FOR WHEEZING OR SHORTNESS OF BREATH  . ALPRAZolam (XANAX) 1 MG tablet TAKE 1 TABLET BY MOUTH 3 TIMES A DAY AS NEEDED FOR ANXIETY  . aspirin 81 MG tablet Take 81 mg by mouth daily.  . Blood Glucose Monitoring Suppl (ACCU-CHEK AVIVA PLUS) w/Device KIT Use as directed to check blood sugar Dx: E11.9, E11.49  . Calcium 200 MG TABS Take 200 mg by mouth daily.  . Calcium Carbonate-Vit D-Min (CALTRATE PLUS PO) Take 1 tablet by mouth daily.  . furosemide (LASIX) 40 MG tablet Take 1 tablet (40 mg total) by mouth daily.  . Guaifenesin 1200 MG TB12 Take 1 tablet (1,200 mg total) by mouth 2 (two) times daily.  Marland Kitchen HYDROcodone-acetaminophen (NORCO) 10-325 MG tablet Take 1 tablet by mouth 2 (two) times daily as needed.  . metFORMIN (GLUCOPHAGE) 500 MG tablet Take 1 tablet (500 mg total) by mouth 2 (two) times daily.  . metoprolol tartrate (LOPRESSOR) 25  MG tablet Take 1 tablet (25 mg total) by mouth 2 (two) times daily.  Marland Kitchen omeprazole (PRILOSEC) 20 MG capsule Take 1 capsule (20 mg total) by mouth daily.  . ondansetron (ZOFRAN) 4 MG tablet Take 1 tablet (4 mg total) by mouth every 8 (eight) hours as needed for nausea or vomiting.  . OXYGEN Inhale 4 L into the lungs.  . potassium chloride (K-DUR,KLOR-CON) 10 MEQ tablet Take 1 tablet (10 mEq total) by mouth daily.  . predniSONE (DELTASONE) 10 MG tablet Take 1 tablet (10 mg total) by mouth daily with breakfast.  . Respiratory Therapy Supplies (FLUTTER) DEVI Use a directed  . senna (SENOKOT) 8.6 MG tablet Take 1 tablet by mouth 2 (two) times daily as needed for constipation.   . traMADol (ULTRAM) 50 MG tablet TAKE 2 TABLETS (100MG TOTAL) BY MOUTH EVERY 6 HOURS AS NEEDED FOR SEVERE PAIN  . TRELEGY ELLIPTA 100-62.5-25 MCG/INH AEPB INHALE 1 PUFF BY MOUTH ONCE DAILY  . valsartan (DIOVAN) 40 MG tablet Take 1 tablet (40 mg total) by mouth daily.  . [DISCONTINUED] atorvastatin (LIPITOR) 20 MG tablet Take 20 mg by mouth daily.  . [DISCONTINUED] calcium carbonate 200 MG capsule Take 250 mg by mouth daily. Does not take everyday  . [DISCONTINUED] furosemide (LASIX) 40 MG tablet Take 1 tablet (40 mg total) by mouth daily.  . [DISCONTINUED] metoprolol tartrate (LOPRESSOR) 25 MG tablet Take 1 tablet (25 mg total) by mouth 2 (two) times daily.  . [DISCONTINUED] potassium chloride (K-DUR,KLOR-CON) 10 MEQ tablet Take 1 tablet (10 mEq total) daily by mouth.  . [DISCONTINUED] valsartan (DIOVAN) 40 MG tablet Take 1 tablet (40 mg total) by mouth daily.     Allergies:   Iohexol; Other; Anoro ellipta [umeclidinium-vilanterol]; Codeine; and Protonix [pantoprazole sodium]   Social History   Tobacco Use  . Smoking status: Former Smoker    Packs/day: 1.00    Years: 40.00    Pack years: 40.00    Types: Cigarettes    Last attempt to quit: 02/10/1998    Years since quitting: 19.9  . Smokeless tobacco: Never Used    Substance Use Topics  . Alcohol use: No  . Drug use: No     Family Hx: The patient's family history includes Arthritis in her daughter; Bone cancer in her father; Cancer in her father; Deep vein thrombosis in her mother; Diabetes in her mother; Heart disease  in her mother; Hyperlipidemia in her mother; Hypertension in her mother; Varicose Veins in her mother.  ROS:   Please see the history of present illness.    Review of Systems  Constitution: Positive for chills, decreased appetite, diaphoresis, fever and malaise/fatigue.  HENT: Positive for hearing loss.   Eyes: Positive for visual disturbance.  Cardiovascular: Positive for chest pain, irregular heartbeat and leg swelling.  Respiratory: Positive for cough, shortness of breath, snoring and wheezing.   Hematologic/Lymphatic: Bruises/bleeds easily.  Musculoskeletal: Positive for back pain, joint pain, joint swelling and myalgias.  Gastrointestinal: Positive for abdominal pain, diarrhea and nausea.  Neurological: Positive for dizziness, headaches and loss of balance.  Psychiatric/Behavioral: Positive for depression. The patient is nervous/anxious.    All other systems reviewed and are negative.   EKGs/Labs/Other Test Reviewed:    EKG:  EKG is  ordered today.  The ekg ordered today demonstrates sinus tachycardia, heart rate 87, no acute ST-T wave changes, QTc 443, similar to old EKGs  Recent Labs: 12/09/2017: B Natriuretic Peptide 27.3; TSH 1.500 12/11/2017: ALT 10 12/14/2017: Magnesium 1.9 01/10/2018: BUN 12; Creatinine, Ser 1.21; Hemoglobin 9.6; Platelets 343.0; Potassium 4.1; Pro B Natriuretic peptide (BNP) 27.0; Sodium 138   Recent Lipid Panel Lab Results  Component Value Date/Time   CHOL 207 (H) 07/04/2017 04:45 PM   CHOL 207 (H) 12/18/2013 11:22 AM   TRIG 275.0 (H) 07/04/2017 04:45 PM   HDL 61.10 07/04/2017 04:45 PM   HDL 63 12/18/2013 11:22 AM   CHOLHDL 3 07/04/2017 04:45 PM   LDLCALC 96 11/22/2016 02:25 PM   LDLCALC 110  (H) 12/18/2013 11:22 AM   LDLDIRECT 131.0 07/04/2017 04:45 PM    Physical Exam:    VS:  BP (!) 118/40   Pulse (!) 112   Ht 5' 6"  (1.676 m)   Wt 163 lb 6.4 oz (74.1 kg)   SpO2 96%   BMI 26.37 kg/m     Wt Readings from Last 3 Encounters:  01/23/18 163 lb 6.4 oz (74.1 kg)  01/13/18 166 lb (75.3 kg)  01/10/18 167 lb 6.4 oz (75.9 kg)     Physical Exam  Constitutional: She is oriented to person, place, and time. She appears well-developed and well-nourished. No distress.  HENT:  Head: Normocephalic and atraumatic.  Eyes: No scleral icterus.  Neck: No JVD present. No thyromegaly present.  Cardiovascular: Normal rate and regular rhythm.  No murmur heard. Pulmonary/Chest: Effort normal. She has no wheezes. She has no rales.  Abdominal: Soft. She exhibits no distension.  Musculoskeletal: She exhibits edema (tight 1+ bilateral ankle edema).  Lymphadenopathy:    She has no cervical adenopathy.  Neurological: She is alert and oriented to person, place, and time.  Skin: Skin is warm and dry.  Psychiatric: She has a normal mood and affect.    ASSESSMENT & PLAN:    Chronic diastolic heart failure [facility])  Echocardiogram September 2019 with normal LV function and mild diastolic dysfunction.  Overall, volume status appears stable on exam.  She does have significant lower extremity swelling.  I suspect that this is multifactorial.  On exam, her lungs are clear and her neck veins are flat.  Given her symptoms, I will obtain a BMET, BNP.  BNP is significantly elevated, I will increase her Lasix.  Other chest pain Somewhat atypical.  She had nonobstructive disease by cardiac catheterization in 2006.  Given her advanced lung disease and recurrent cancer, I do not think she is a candidate for Cardiac Catheterization.  Therefore, I do not think that stress testing will be helpful.  Her ECG does not demonstrate acute changes.  I will give her a Rx for NTG to use as needed.  She can continue  ASA.  Sinus tachycardia  She has a chronic hx of sinus tachycardia.  This is probably related to ongoing lung issues.  I do not think her blood pressure will tolerate increasing her beta-blocker further. Continue current Rx.   Primary cancer of right upper lobe of lung ([place]) Continue follow up with oncology and pulmonology as planned.  Leg swelling  I suspect her leg swelling is multifactorial.  Her albumin in 12/2017 was 2.6.  I suspect hypoalbuminemia is contributing.  I have encouraged her to keep her legs elevated, wear compression stockings, increase protein in her diet, use Glucerna and discuss managing her protein further with oncology.     Dispo:  Return in about 3 months (around 04/25/2018) for Routine Follow Up, w/ Dr. Burt Knack, or Richardson Dopp, PA-C.   Medication Adjustments/Labs and Tests Ordered: Current medicines are reviewed at length with the patient today.  Concerns regarding medicines are outlined above.  Tests Ordered: Orders Placed This Encounter  Procedures  . Basic Metabolic Panel (BMET)  . Pro b natriuretic peptide  . EKG 12-Lead   Medication Changes: Meds ordered this encounter  Medications  . nitroGLYCERIN (NITROSTAT) 0.4 MG SL tablet    Sig: Place 1 tablet (0.4 mg total) under the tongue every 5 (five) minutes as needed.    Dispense:  25 tablet    Refill:  3  . furosemide (LASIX) 40 MG tablet    Sig: Take 1 tablet (40 mg total) by mouth daily.    Dispense:  90 tablet    Refill:  3  . metoprolol tartrate (LOPRESSOR) 25 MG tablet    Sig: Take 1 tablet (25 mg total) by mouth 2 (two) times daily.    Dispense:  180 tablet    Refill:  3  . potassium chloride (K-DUR,KLOR-CON) 10 MEQ tablet    Sig: Take 1 tablet (10 mEq total) by mouth daily.    Dispense:  90 tablet    Refill:  3  . valsartan (DIOVAN) 40 MG tablet    Sig: Take 1 tablet (40 mg total) by mouth daily.    Dispense:  90 tablet    Refill:  3    Signed, Richardson Dopp, PA-C  01/23/2018 2:00 PM     [address], [place], [place]  [address] Phone: [phone]; Fax: [phone]

## 2018-01-23 ENCOUNTER — Encounter: Payer: Self-pay | Admitting: Physician Assistant

## 2018-01-23 ENCOUNTER — Telehealth: Payer: Self-pay | Admitting: Emergency Medicine

## 2018-01-23 ENCOUNTER — Ambulatory Visit (INDEPENDENT_AMBULATORY_CARE_PROVIDER_SITE_OTHER): Payer: Medicare Other | Admitting: Physician Assistant

## 2018-01-23 ENCOUNTER — Encounter

## 2018-01-23 VITALS — BP 118/40 | HR 112 | Ht 66.0 in | Wt 163.4 lb

## 2018-01-23 DIAGNOSIS — R000 Tachycardia, unspecified: Secondary | ICD-10-CM

## 2018-01-23 DIAGNOSIS — C3411 Malignant neoplasm of upper lobe, right bronchus or lung: Secondary | ICD-10-CM | POA: Diagnosis not present

## 2018-01-23 DIAGNOSIS — M7989 Other specified soft tissue disorders: Secondary | ICD-10-CM

## 2018-01-23 DIAGNOSIS — R0789 Other chest pain: Secondary | ICD-10-CM | POA: Diagnosis not present

## 2018-01-23 DIAGNOSIS — I5032 Chronic diastolic (congestive) heart failure: Secondary | ICD-10-CM | POA: Diagnosis not present

## 2018-01-23 MED ORDER — VALSARTAN 40 MG PO TABS: 40.0000 mg | ORAL_TABLET | Freq: Every day | ORAL | 3 refills | Status: AC

## 2018-01-23 MED ORDER — POTASSIUM CHLORIDE CRYS ER 10 MEQ PO TBCR: 10.0000 meq | EXTENDED_RELEASE_TABLET | Freq: Every day | ORAL | 3 refills | Status: AC

## 2018-01-23 MED ORDER — METOPROLOL TARTRATE 25 MG PO TABS: 25.0000 mg | ORAL_TABLET | Freq: Two times a day (BID) | ORAL | 3 refills | Status: AC

## 2018-01-23 MED ORDER — FUROSEMIDE 40 MG PO TABS: 40.0000 mg | ORAL_TABLET | Freq: Every day | ORAL | 3 refills | Status: AC

## 2018-01-23 MED ORDER — NITROGLYCERIN 0.4 MG SL SUBL: 0.4000 mg | SUBLINGUAL_TABLET | SUBLINGUAL | 3 refills | Status: AC | PRN

## 2018-01-23 NOTE — Telephone Encounter (Signed)
Called pt's son Legrand Como who stated pt has not been able to breathe and has been worse x3 days due to feeling like pt has fluid building up in lungs.  Wyona Almas to take pt to the ER due to pt struggling to breathe.  Legrand Como expressed understanding and stated they would get pt to the ER. Nothing further needed.

## 2018-01-23 NOTE — Patient Instructions (Addendum)
Medication Instructions:  AN RX FOR NITROGLYCERIN HAS BEEN SENT IN   REFILLS HAVE BEEN SENT IN FOR YOUR CARDIAC MEDICATIONS  If you need a refill on your cardiac medications before your next appointment, please call your pharmacy.   Lab work: 1. TODAY BMET, PRO BNP If you have labs (blood work) drawn today and your tests are completely normal, you will receive your results only by: Marland Kitchen MyChart Message (if you have MyChart) OR . A paper copy in the mail If you have any lab test that is abnormal or we need to change your treatment, we will call you to review the results.  Testing/Procedures: NONE ORDERED TODAY  Follow-Up: At [facility], you and your health needs are our priority.  As part of our continuing mission to provide you with exceptional heart care, we have created designated Provider Care Teams.  These Care Teams include your primary Cardiologist (physician) and Advanced Practice Providers (APPs -  Physician Assistants and Nurse Practitioners) who all work together to provide you with the care you need, when you need it. You will need a follow up appointment in:  3 months.  Please call our office 2 months in advance to schedule this appointment.  You may see Sherren Mocha, MD or one of the following Advanced Practice Providers on your designated Care Team: Richardson Dopp, [place] 05/03/18 @ 1:45 PM  Vin Bhagat, PA-C . Daune Perch, NP  Any Other Special Instructions Will Be Listed Below (If Applicable). 1. FOLLOW A BLAND DIET FOR SEVERAL DAYS 2. WEIGH DAILY; CALL IF YOUR WEIGHT IS INCREASED BY 3 LB'S IN 1 DAY OR 5 LB'S IN 1 WEEK [phone]; SCOTT WEAVER, PAC  3. TRY GLUCERNA AS A MEAL REPLACEMENT  4. TRY A PROBIOTIC 5. MAKE SURE TO FOLLOW UP WITH PRIMARY CARE ABOUT DIARRHEA

## 2018-01-24 ENCOUNTER — Telehealth: Payer: Self-pay | Admitting: *Deleted

## 2018-01-24 LAB — BASIC METABOLIC PANEL
BUN/Creatinine Ratio: 12 (ref 12–28)
BUN: 12 mg/dL (ref 8–27)
CALCIUM: 9 mg/dL (ref 8.7–10.3)
CO2: 28 mmol/L (ref 20–29)
Chloride: 96 mmol/L (ref 96–106)
Creatinine, Ser: 0.98 mg/dL (ref 0.57–1.00)
GFR, EST AFRICAN AMERICAN: 66 mL/min/{1.73_m2} (ref >59)
GFR, EST NON AFRICAN AMERICAN: 57 mL/min/{1.73_m2} — AB (ref >59)
Glucose: 137 mg/dL — ABNORMAL HIGH (ref 65–99)
POTASSIUM: 3.6 mmol/L (ref 3.5–5.2)
Sodium: 144 mmol/L (ref 134–144)

## 2018-01-24 LAB — PRO B NATRIURETIC PEPTIDE: NT-Pro BNP: 199 pg/mL (ref 0–301)

## 2018-01-24 NOTE — Telephone Encounter (Signed)
-----   Message from Liliane Shi, PA-C sent at 01/24/2018  7:48 AM EDT ----- Renal function, potassium normal.  Glucose elevated.  BNP normal.  Recommendations:  - Continue current medications and follow up as planned.  Richardson Dopp, PA-C    01/24/2018 7:47 AM

## 2018-01-24 NOTE — Telephone Encounter (Signed)
DPR ok to s/w pt's daughter who has been notified of lab results for the pt. Pt's daughter aware to continue on current Tx plan. Pt's daughter thanked me for the call .

## 2018-01-26 ENCOUNTER — Telehealth: Payer: Self-pay | Admitting: Medical Oncology

## 2018-01-26 DIAGNOSIS — G4700 Insomnia, unspecified: Secondary | ICD-10-CM | POA: Diagnosis not present

## 2018-01-26 DIAGNOSIS — F418 Other specified anxiety disorders: Secondary | ICD-10-CM | POA: Diagnosis not present

## 2018-01-26 DIAGNOSIS — J449 Chronic obstructive pulmonary disease, unspecified: Secondary | ICD-10-CM | POA: Diagnosis not present

## 2018-01-26 DIAGNOSIS — C3490 Malignant neoplasm of unspecified part of unspecified bronchus or lung: Secondary | ICD-10-CM | POA: Diagnosis not present

## 2018-01-26 DIAGNOSIS — I509 Heart failure, unspecified: Secondary | ICD-10-CM | POA: Diagnosis not present

## 2018-01-26 NOTE — Telephone Encounter (Signed)
Left message  with appts for October.

## 2018-01-27 ENCOUNTER — Inpatient Hospital Stay (HOSPITAL_COMMUNITY)
Admission: EM | Admit: 2018-01-27 | Discharge: 2018-01-30 | DRG: 194 | Disposition: A | Discharge status: Home Health | Payer: Medicare Other | Attending: Internal Medicine | Admitting: Internal Medicine

## 2018-01-27 ENCOUNTER — Emergency Department (HOSPITAL_COMMUNITY): Payer: Medicare Other

## 2018-01-27 ENCOUNTER — Other Ambulatory Visit: Payer: Self-pay

## 2018-01-27 ENCOUNTER — Encounter (HOSPITAL_COMMUNITY): Payer: Self-pay

## 2018-01-27 DIAGNOSIS — J181 Lobar pneumonia, unspecified organism: Secondary | ICD-10-CM

## 2018-01-27 DIAGNOSIS — Z923 Personal history of irradiation: Secondary | ICD-10-CM

## 2018-01-27 DIAGNOSIS — G2581 Restless legs syndrome: Secondary | ICD-10-CM | POA: Diagnosis present

## 2018-01-27 DIAGNOSIS — Z7952 Long term (current) use of systemic steroids: Secondary | ICD-10-CM | POA: Diagnosis not present

## 2018-01-27 DIAGNOSIS — M797 Fibromyalgia: Secondary | ICD-10-CM | POA: Diagnosis present

## 2018-01-27 DIAGNOSIS — K210 Gastro-esophageal reflux disease with esophagitis: Secondary | ICD-10-CM | POA: Diagnosis present

## 2018-01-27 DIAGNOSIS — Z9981 Dependence on supplemental oxygen: Secondary | ICD-10-CM | POA: Diagnosis not present

## 2018-01-27 DIAGNOSIS — E876 Hypokalemia: Secondary | ICD-10-CM | POA: Diagnosis not present

## 2018-01-27 DIAGNOSIS — J440 Chronic obstructive pulmonary disease with acute lower respiratory infection: Secondary | ICD-10-CM | POA: Diagnosis present

## 2018-01-27 DIAGNOSIS — R0789 Other chest pain: Secondary | ICD-10-CM | POA: Diagnosis not present

## 2018-01-27 DIAGNOSIS — Z85118 Personal history of other malignant neoplasm of bronchus and lung: Secondary | ICD-10-CM | POA: Diagnosis not present

## 2018-01-27 DIAGNOSIS — R079 Chest pain, unspecified: Secondary | ICD-10-CM

## 2018-01-27 DIAGNOSIS — Z7982 Long term (current) use of aspirin: Secondary | ICD-10-CM

## 2018-01-27 DIAGNOSIS — J189 Pneumonia, unspecified organism: Secondary | ICD-10-CM | POA: Diagnosis present

## 2018-01-27 DIAGNOSIS — R5382 Chronic fatigue, unspecified: Secondary | ICD-10-CM | POA: Diagnosis present

## 2018-01-27 DIAGNOSIS — E785 Hyperlipidemia, unspecified: Secondary | ICD-10-CM | POA: Diagnosis present

## 2018-01-27 DIAGNOSIS — J90 Pleural effusion, not elsewhere classified: Secondary | ICD-10-CM | POA: Diagnosis present

## 2018-01-27 DIAGNOSIS — R0602 Shortness of breath: Secondary | ICD-10-CM

## 2018-01-27 DIAGNOSIS — R000 Tachycardia, unspecified: Secondary | ICD-10-CM | POA: Diagnosis not present

## 2018-01-27 DIAGNOSIS — I480 Paroxysmal atrial fibrillation: Secondary | ICD-10-CM | POA: Diagnosis present

## 2018-01-27 DIAGNOSIS — Z87891 Personal history of nicotine dependence: Secondary | ICD-10-CM | POA: Diagnosis not present

## 2018-01-27 DIAGNOSIS — C3411 Malignant neoplasm of upper lobe, right bronchus or lung: Secondary | ICD-10-CM

## 2018-01-27 DIAGNOSIS — J449 Chronic obstructive pulmonary disease, unspecified: Secondary | ICD-10-CM | POA: Diagnosis present

## 2018-01-27 DIAGNOSIS — Z8249 Family history of ischemic heart disease and other diseases of the circulatory system: Secondary | ICD-10-CM

## 2018-01-27 DIAGNOSIS — Z833 Family history of diabetes mellitus: Secondary | ICD-10-CM

## 2018-01-27 DIAGNOSIS — E1151 Type 2 diabetes mellitus with diabetic peripheral angiopathy without gangrene: Secondary | ICD-10-CM | POA: Diagnosis present

## 2018-01-27 DIAGNOSIS — K219 Gastro-esophageal reflux disease without esophagitis: Secondary | ICD-10-CM | POA: Diagnosis present

## 2018-01-27 DIAGNOSIS — E1142 Type 2 diabetes mellitus with diabetic polyneuropathy: Secondary | ICD-10-CM | POA: Diagnosis present

## 2018-01-27 DIAGNOSIS — Z79899 Other long term (current) drug therapy: Secondary | ICD-10-CM

## 2018-01-27 DIAGNOSIS — J9611 Chronic respiratory failure with hypoxia: Secondary | ICD-10-CM | POA: Diagnosis present

## 2018-01-27 DIAGNOSIS — Z7984 Long term (current) use of oral hypoglycemic drugs: Secondary | ICD-10-CM

## 2018-01-27 DIAGNOSIS — F410 Panic disorder [episodic paroxysmal anxiety] without agoraphobia: Secondary | ICD-10-CM | POA: Diagnosis present

## 2018-01-27 DIAGNOSIS — M810 Age-related osteoporosis without current pathological fracture: Secondary | ICD-10-CM | POA: Diagnosis present

## 2018-01-27 DIAGNOSIS — Z96642 Presence of left artificial hip joint: Secondary | ICD-10-CM | POA: Diagnosis present

## 2018-01-27 DIAGNOSIS — I10 Essential (primary) hypertension: Secondary | ICD-10-CM | POA: Diagnosis present

## 2018-01-27 DIAGNOSIS — G8929 Other chronic pain: Secondary | ICD-10-CM | POA: Diagnosis present

## 2018-01-27 DIAGNOSIS — I2510 Atherosclerotic heart disease of native coronary artery without angina pectoris: Secondary | ICD-10-CM | POA: Diagnosis present

## 2018-01-27 DIAGNOSIS — Z86718 Personal history of other venous thrombosis and embolism: Secondary | ICD-10-CM

## 2018-01-27 DIAGNOSIS — J9691 Respiratory failure, unspecified with hypoxia: Secondary | ICD-10-CM | POA: Diagnosis present

## 2018-01-27 DIAGNOSIS — E1149 Type 2 diabetes mellitus with other diabetic neurological complication: Secondary | ICD-10-CM | POA: Diagnosis present

## 2018-01-27 DIAGNOSIS — I509 Heart failure, unspecified: Secondary | ICD-10-CM | POA: Diagnosis present

## 2018-01-27 DIAGNOSIS — I110 Hypertensive heart disease with heart failure: Secondary | ICD-10-CM | POA: Diagnosis present

## 2018-01-27 DIAGNOSIS — Z9221 Personal history of antineoplastic chemotherapy: Secondary | ICD-10-CM

## 2018-01-27 DIAGNOSIS — E559 Vitamin D deficiency, unspecified: Secondary | ICD-10-CM | POA: Diagnosis present

## 2018-01-27 LAB — BRAIN NATRIURETIC PEPTIDE: B Natriuretic Peptide: 39.5 pg/mL (ref 0.0–100.0)

## 2018-01-27 LAB — CBC WITH DIFFERENTIAL/PLATELET
Abs Immature Granulocytes: 0.02 10*3/uL (ref 0.00–0.07)
BASOS ABS: 0 10*3/uL (ref 0.0–0.1)
BASOS PCT: 0 %
EOS ABS: 0 10*3/uL (ref 0.0–0.5)
EOS PCT: 0 %
HEMATOCRIT: 33.4 % — AB (ref 36.0–46.0)
Hemoglobin: 10.2 g/dL — ABNORMAL LOW (ref 12.0–15.0)
IMMATURE GRANULOCYTES: 0 %
LYMPHS ABS: 1 10*3/uL (ref 0.7–4.0)
Lymphocytes Relative: 10 %
MCH: 28.7 pg (ref 26.0–34.0)
MCHC: 30.5 g/dL (ref 30.0–36.0)
MCV: 93.8 fL (ref 80.0–100.0)
Monocytes Absolute: 0.4 10*3/uL (ref 0.1–1.0)
Monocytes Relative: 4 %
NEUTROS PCT: 86 %
NRBC: 0 % (ref 0.0–0.2)
Neutro Abs: 8.1 10*3/uL — ABNORMAL HIGH (ref 1.7–7.7)
PLATELETS: 237 10*3/uL (ref 150–400)
RBC: 3.56 MIL/uL — AB (ref 3.87–5.11)
RDW: 14.6 % (ref 11.5–15.5)
WBC: 9.5 10*3/uL (ref 4.0–10.5)

## 2018-01-27 LAB — URINALYSIS, ROUTINE W REFLEX MICROSCOPIC
BILIRUBIN URINE: NEGATIVE
Glucose, UA: NEGATIVE mg/dL
HGB URINE DIPSTICK: NEGATIVE
Ketones, ur: NEGATIVE mg/dL
Leukocytes, UA: NEGATIVE
Nitrite: NEGATIVE
PH: 8 (ref 5.0–8.0)
Protein, ur: NEGATIVE mg/dL
SPECIFIC GRAVITY, URINE: 1.006 (ref 1.005–1.030)

## 2018-01-27 LAB — COMPREHENSIVE METABOLIC PANEL
ALBUMIN: 3.6 g/dL (ref 3.5–5.0)
ALT: 10 U/L (ref 0–44)
AST: 13 U/L — AB (ref 15–41)
Alkaline Phosphatase: 80 U/L (ref 38–126)
Anion gap: 10 (ref 5–15)
BILIRUBIN TOTAL: 0.6 mg/dL (ref 0.3–1.2)
BUN: 12 mg/dL (ref 8–23)
CHLORIDE: 101 mmol/L (ref 98–111)
CO2: 32 mmol/L (ref 22–32)
Calcium: 9 mg/dL (ref 8.9–10.3)
Creatinine, Ser: 0.78 mg/dL (ref 0.44–1.00)
GFR calc Af Amer: >60 mL/min (ref >60)
GFR calc non Af Amer: >60 mL/min (ref >60)
GLUCOSE: 139 mg/dL — AB (ref 70–99)
POTASSIUM: 3.5 mmol/L (ref 3.5–5.1)
SODIUM: 143 mmol/L (ref 135–145)
TOTAL PROTEIN: 6.6 g/dL (ref 6.5–8.1)

## 2018-01-27 LAB — GLUCOSE, CAPILLARY: Glucose-Capillary: 169 mg/dL — ABNORMAL HIGH (ref 70–99)

## 2018-01-27 LAB — PROTIME-INR
INR: 0.88
PROTHROMBIN TIME: 11.8 s (ref 11.4–15.2)

## 2018-01-27 LAB — INFLUENZA PANEL BY PCR (TYPE A & B)
Influenza A By PCR: NEGATIVE
Influenza B By PCR: NEGATIVE

## 2018-01-27 LAB — I-STAT TROPONIN, ED
TROPONIN I, POC: 0 ng/mL (ref 0.00–0.08)
Troponin i, poc: 0 ng/mL (ref 0.00–0.08)

## 2018-01-27 LAB — MAGNESIUM: Magnesium: 1.3 mg/dL — ABNORMAL LOW (ref 1.7–2.4)

## 2018-01-27 MED ORDER — TRAMADOL HCL 50 MG PO TABS
100.0000 mg | ORAL_TABLET | Freq: Four times a day (QID) | ORAL | Status: DC | PRN
  Administered 2018-01-28 – 2018-01-30 (×2): 100 mg via ORAL
  Filled 2018-01-27 (×2): qty 2

## 2018-01-27 MED ORDER — ONDANSETRON HCL 4 MG PO TABS
4.0000 mg | ORAL_TABLET | Freq: Three times a day (TID) | ORAL | Status: DC | PRN
  Administered 2018-01-28: 4 mg via ORAL
  Filled 2018-01-27: qty 1

## 2018-01-27 MED ORDER — SODIUM CHLORIDE 0.9 % IV SOLN
2.0000 g | Freq: Once | INTRAVENOUS | Status: AC
  Administered 2018-01-27: 2 g via INTRAVENOUS
  Filled 2018-01-27: qty 2

## 2018-01-27 MED ORDER — HYDROCODONE-ACETAMINOPHEN 10-325 MG PO TABS
1.0000 | ORAL_TABLET | Freq: Four times a day (QID) | ORAL | Status: DC | PRN
  Administered 2018-01-29: 1 via ORAL
  Filled 2018-01-27: qty 1

## 2018-01-27 MED ORDER — LEVOFLOXACIN IN D5W 750 MG/150ML IV SOLN
750.0000 mg | INTRAVENOUS | Status: DC
  Administered 2018-01-27 – 2018-01-28 (×2): 750 mg via INTRAVENOUS
  Filled 2018-01-27 (×2): qty 150

## 2018-01-27 MED ORDER — SENNA 8.6 MG PO TABS: 1.0000 | ORAL_TABLET | Freq: Two times a day (BID) | ORAL | Status: DC | PRN

## 2018-01-27 MED ORDER — SODIUM CHLORIDE 0.9 % IV SOLN
250.0000 mL | INTRAVENOUS | Status: DC | PRN
  Administered 2018-01-27: 250 mL via INTRAVENOUS

## 2018-01-27 MED ORDER — PANTOPRAZOLE SODIUM 40 MG PO TBEC
40.0000 mg | DELAYED_RELEASE_TABLET | Freq: Every day | ORAL | Status: DC
  Administered 2018-01-28 – 2018-01-29 (×2): 40 mg via ORAL
  Filled 2018-01-27 (×2): qty 1

## 2018-01-27 MED ORDER — ALBUTEROL SULFATE (2.5 MG/3ML) 0.083% IN NEBU
2.5000 mg | INHALATION_SOLUTION | Freq: Four times a day (QID) | RESPIRATORY_TRACT | Status: DC | PRN
  Administered 2018-01-27 – 2018-01-30 (×3): 2.5 mg via RESPIRATORY_TRACT
  Filled 2018-01-27 (×3): qty 3

## 2018-01-27 MED ORDER — ENOXAPARIN SODIUM 40 MG/0.4ML ~~LOC~~ SOLN
40.0000 mg | SUBCUTANEOUS | Status: DC
  Administered 2018-01-27 – 2018-01-29 (×3): 40 mg via SUBCUTANEOUS
  Filled 2018-01-27 (×3): qty 0.4

## 2018-01-27 MED ORDER — SODIUM CHLORIDE 0.9% FLUSH
3.0000 mL | Freq: Two times a day (BID) | INTRAVENOUS | Status: DC
  Administered 2018-01-27 – 2018-01-29 (×3): 3 mL via INTRAVENOUS

## 2018-01-27 MED ORDER — ALPRAZOLAM 1 MG PO TABS: 1.0000 mg | ORAL_TABLET | Freq: Three times a day (TID) | ORAL | Status: DC | PRN

## 2018-01-27 MED ORDER — ONDANSETRON HCL 4 MG/2ML IJ SOLN
4.0000 mg | Freq: Once | INTRAMUSCULAR | Status: AC
  Administered 2018-01-27: 4 mg via INTRAVENOUS
  Filled 2018-01-27: qty 2

## 2018-01-27 MED ORDER — SODIUM CHLORIDE 0.9 % IV SOLN
1.0000 g | Freq: Three times a day (TID) | INTRAVENOUS | Status: DC
  Administered 2018-01-28: 1 g via INTRAVENOUS
  Filled 2018-01-27 (×4): qty 1

## 2018-01-27 MED ORDER — ASPIRIN EC 81 MG PO TBEC
81.0000 mg | DELAYED_RELEASE_TABLET | Freq: Every day | ORAL | Status: DC
  Administered 2018-01-28 – 2018-01-30 (×3): 81 mg via ORAL
  Filled 2018-01-27 (×3): qty 1

## 2018-01-27 MED ORDER — FUROSEMIDE 40 MG PO TABS
40.0000 mg | ORAL_TABLET | Freq: Every day | ORAL | Status: DC
  Administered 2018-01-28 – 2018-01-30 (×3): 40 mg via ORAL
  Filled 2018-01-27 (×3): qty 1

## 2018-01-27 MED ORDER — METOPROLOL TARTRATE 25 MG PO TABS
25.0000 mg | ORAL_TABLET | Freq: Two times a day (BID) | ORAL | Status: DC
  Administered 2018-01-27 – 2018-01-30 (×6): 25 mg via ORAL
  Filled 2018-01-27 (×6): qty 1

## 2018-01-27 MED ORDER — BUSPIRONE HCL 5 MG PO TABS
15.0000 mg | ORAL_TABLET | Freq: Every day | ORAL | Status: DC
  Administered 2018-01-27 – 2018-01-30 (×2): 15 mg via ORAL
  Filled 2018-01-27 (×4): qty 3

## 2018-01-27 MED ORDER — PREDNISONE 20 MG PO TABS
40.0000 mg | ORAL_TABLET | Freq: Every day | ORAL | Status: DC
  Administered 2018-01-28: 40 mg via ORAL
  Filled 2018-01-27 (×2): qty 2

## 2018-01-27 MED ORDER — SODIUM CHLORIDE 0.9% FLUSH: 3.0000 mL | INTRAVENOUS | Status: DC | PRN

## 2018-01-27 MED ORDER — UMECLIDINIUM BROMIDE 62.5 MCG/INH IN AEPB
1.0000 | INHALATION_SPRAY | Freq: Every day | RESPIRATORY_TRACT | Status: DC
  Administered 2018-01-28 – 2018-01-30 (×3): 1 via RESPIRATORY_TRACT
  Filled 2018-01-27: qty 7

## 2018-01-27 MED ORDER — FLUTICASONE-UMECLIDIN-VILANT 100-62.5-25 MCG/INH IN AEPB: 1.0000 | INHALATION_SPRAY | Freq: Every day | RESPIRATORY_TRACT | Status: DC

## 2018-01-27 MED ORDER — VANCOMYCIN HCL 10 G IV SOLR
1500.0000 mg | Freq: Once | INTRAVENOUS | Status: DC
  Administered 2018-01-27: 1500 mg via INTRAVENOUS
  Filled 2018-01-27: qty 1500

## 2018-01-27 MED ORDER — FLUTICASONE FUROATE-VILANTEROL 100-25 MCG/INH IN AEPB
1.0000 | INHALATION_SPRAY | Freq: Every day | RESPIRATORY_TRACT | Status: DC
  Administered 2018-01-28 – 2018-01-30 (×3): 1 via RESPIRATORY_TRACT
  Filled 2018-01-27: qty 28

## 2018-01-27 MED ORDER — IRBESARTAN 75 MG PO TABS
75.0000 mg | ORAL_TABLET | Freq: Every day | ORAL | Status: DC
  Administered 2018-01-28 – 2018-01-30 (×3): 75 mg via ORAL
  Filled 2018-01-27 (×3): qty 1

## 2018-01-27 NOTE — ED Notes (Signed)
Bed: WA13 Expected date:  Expected time:  Means of arrival:  Comments: EMS-SOB 

## 2018-01-27 NOTE — Progress Notes (Signed)
A consult was received from an ED physician for vancomycin per pharmacy dosing.  The patient's profile has been reviewed for ht/wt/allergies/indication/available labs.    A one time order has been placed for vancomycin 1500 mg.  Further antibiotics/pharmacy consults should be ordered by admitting physician if indicated.                       Thank you, Lenis Noon, PharmD 01/27/2018  6:10 PM

## 2018-01-27 NOTE — Progress Notes (Signed)
Pharmacy Antibiotic Note  [name] is a 72 y.o. female admitted on 01/27/2018 with pneumonia.  Pharmacy has been consulted for cefepime and levaquin dosing.  WBC WNL, SCr WNL, AF. CXR: New small subtle rounded opacity within the LEFT upper lung. This likely represents a small focus of rounded atelectasis or early developing pneumonia, with progressive metastatic disease considered less likely given the short-term interval since previous chest x-ray.  Plan: Levaquin 750 mg IV q24 Cefepime 2 gm IV x 1 in ED then Cefepime 1 gm IV q8h F/u renal fxn, WBC, temp, culture data  Height: 5\' 6"  (167.6 cm) Weight: 166 lb (75.3 kg) IBW/kg (Calculated) : 59.3  Temp (24hrs), Avg:98.9 F (37.2 C), Min:98.9 F (37.2 C), Max:98.9 F (37.2 C)  Recent Labs  Lab 01/23/18 1406 01/27/18 1526  WBC  --  9.5  CREATININE 0.98 0.78    Estimated Creatinine Clearance: 65 mL/min (by C-G formula based on SCr of 0.78 mg/dL).    Allergies  Allergen Reactions  . Iohexol Shortness Of Breath and Other (See Comments)    "burns me up inside"    Pt does fine with 13 hour prep  01/17/12  . Other     CAN'T STAND THE SMELL OF PURE BLEACH; "HAVE TO BACK UP AND POUR AND DON'T BREATH IT TIL i GET CAP BACK ON; DILUTE IT TO SPRAY"  . Anoro Ellipta [Umeclidinium-Vilanterol] Swelling    Swelling of lips and mouth  . Codeine Itching and Nausea And Vomiting    REACTION: GI upset  . Protonix [Pantoprazole Sodium] Diarrhea    Antimicrobials this admission: 10/18 lvq>> 10/18 cefepime>> Dose adjustments this admission:  Microbiology results: 10/18 BCx2>>sent 10/18 Uxc>>  Thank you for allowing pharmacy to be a part of this patient's care.  Eudelia Bunch, Pharm.D [phone] 01/27/2018 7:23 PM

## 2018-01-27 NOTE — ED Provider Notes (Signed)
[facility] DEPT Provider Note   CSN: [id number] Arrival date & time: 01/27/18  1318     History   Chief Complaint Chief Complaint  Patient presents with  . Shortness of Breath    HPI [name] is a 72 y.o. female.   Shortness of Breath  This is a recurrent problem. The average episode lasts 2 weeks. The problem occurs continuously.The current episode started more than 1 week ago. The problem has been gradually worsening. Associated symptoms include a fever, cough, sputum production, chest pain and leg swelling. Pertinent negatives include no headaches, no rhinorrhea, no neck pain, no hemoptysis, no wheezing, no syncope, no vomiting, no abdominal pain, no rash and no leg pain. She has tried nothing for the symptoms. The treatment provided no relief. Associated medical issues include COPD, CAD, heart failure and DVT (prior per chart).    Past Medical History:  Diagnosis Date  . 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 Anemia in neoplastic disease   . Anxiety   . Arthritis    "all over my body"  . Asthma   . Asymptomatic varicose veins   . Atherosclerosis of native arteries of the extremities, unspecified   . Atrial fibrillation ([place])   . Bronchial pneumonia    history  . 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 Chemotherapy adverse reaction 09/15/11   "makes me light headed and pass out; this is 3rd blood transfusion since going thru it"  . CHF (congestive heart failure) ([place])   . Chronic airway obstruction, not elsewhere classified   . Chronic fatigue 08/19/2015  . Chronic sinusitis   . Complication of anesthesia    slow to wake up  . COPD with emphysema ([place])   . Coronary artery disease    Dr. Burt Knack had stress test done  . DDD (degenerative disc disease), lumbar   . Depressive disorder, not elsewhere classified   . Diseases of lips   . Disorder of bone and cartilage, unspecified   . DVT (deep venous thrombosis) ([place])     . Fibromyalgia   . Functional urinary incontinence   . GERD (gastroesophageal reflux disease)   . Hard of hearing, right   . Herpes zoster without mention of complication   . Hip pain, chronic 09/17/2014  . History of blood clots    "2 in my aorta"  . History of blood transfusion   . History of bronchitis   . History of echocardiogram    Echo 11/18: mild LVH, EF 60-65, no RWMA, Gr 1 DD, Ao Sclerosis, mild AI, RVH with low nl RSVF, trivial eff  . Hx of radiation therapy 04/28/11 -06/08/11   right upper lobe-lung  . Hyperlipidemia   . Hypertension   . Kidney infection    "related to chemo"  . Lumbago   . Malignant neoplasm of bronchus and lung, unspecified site   . Mental disorder   . Muscle weakness (generalized)   . Non-small cell lung cancer ([place]) 03/31/2011   Adenocarcinoma Rt upper lobe mass  . Osteoarthrosis, unspecified whether generalized or localized, unspecified site   . Osteoporosis   . Other abnormal blood chemistry   . Other and unspecified hyperlipidemia   . PAD (peripheral artery disease) ([place])   . Pain in thoracic spine   . 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 Primary cancer of right upper lobe of lung ([place]) 02/08/2011   Biopsy Rt upper lobe mass 03/31/11>>adenocarcinoma DIAGNOSIS: Stage  IIB/IIIA non-small cell lung cancer consistent with adenocarcinoma diagnosed in December 2012.   PRIOR THERAPY:  1) Status post radiation therapy to the right upper lobe lung mass completed on 06/04/2011 under the care of Dr. Pablo Ledger.  2) Systemic chemotherapy with carboplatin for AUC of 5 and Alimta 500 mg/M2 giving every 3   . Reflux   . Reflux esophagitis   . Respiratory failure with hypoxia 01/02/2010  . Restless legs syndrome (RLS)   . Scoliosis   . Screening for thyroid disorder   . Shortness of breath    "anytime really"  . Sinus headache    "all the time"  . Spasm of muscle   . Syncope and collapse   . Type II or unspecified type diabetes mellitus with  neurological manifestations, uncontrolled(250.62)   . Type II or unspecified type diabetes mellitus without mention of complication, not stated as uncontrolled   . Type II or unspecified type diabetes mellitus without mention of complication, not stated as uncontrolled   . Unspecified constipation   . Unspecified essential hypertension   . Unspecified hearing loss   . Unspecified hereditary and idiopathic peripheral neuropathy   . Unspecified vitamin D deficiency     Patient Active Problem List   Diagnosis Date Noted  . Fever of unknown origin 01/10/2018  . Pleural effusion 12/09/2017  . Chest pain 12/09/2017  . 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 Nasal sore 10/25/2017  . COPD with acute exacerbation ([place]) 10/25/2017  . Goals of care, counseling/discussion 09/01/2017  . Encounter for antineoplastic immunotherapy 09/01/2017  . Pneumonia 08/10/2017  . Urinary frequency 07/04/2017  . Chronic pain 07/04/2017  . Port-A-Cath in place 05/05/2017  . Memory changes 02/14/2017  . Constipation 02/14/2017  . CAD (coronary artery disease) 11/30/2016  . Thrush 07/21/2016  . Benign paroxysmal positional vertigo 01/20/2016  . Palliative care encounter 08/19/2015  . Chronic fatigue 08/19/2015  . Anxiety and depression 07/29/2015  . Headache, tension type, chronic 09/17/2014  . Hip pain, chronic 09/17/2014  . History of fall 05/21/2014  . Insomnia 01/09/2014  . Gait abnormality 08/16/2013  . 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 Oropharyngeal dysphagia 08/01/2013  . PUD (peptic ulcer disease) 08/01/2013  . Myalgia and myositis 08/01/2013  . Esophageal reflux 06/27/2013  . Atherosclerosis of native arteries of the extremities with intermittent claudication 10/26/2012  . 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 Restless legs syndrome (RLS)   . GERD (gastroesophageal reflux disease)   . Heart murmur   . History of blood clots   . Syncope and collapse   .  History of blood transfusion   . Fibromyalgia   . DDD (degenerative disc disease), lumbar   . Scoliosis   . Mental disorder   . Chronic diastolic heart failure ([place])   . Hx of radiation therapy   . Back pain 11/26/2011  . Chronic cough 11/26/2011  . SOB (shortness of breath) 09/15/2011  . Weakness generalized 09/15/2011  . Anemia 09/15/2011  . Hypokalemia 08/13/2011  . Primary cancer of right upper lobe of lung ([place]) 02/08/2011  . Respiratory failure with hypoxia ([place]) 01/02/2010  . PAD (peripheral artery disease) ([place]) 08/06/2008  . Dyslipidemia 10/28/2006  . Essential hypertension 10/28/2006  . Sinusitis, chronic 10/28/2006  . COPD (chronic obstructive pulmonary disease) ([place]) 10/28/2006    Past Surgical History:  Procedure Laterality Date  . BRONCHOSCOPY  02/2011  . CARDIAC CATHETERIZATION    . CATARACT EXTRACTION  03/12/2013   Left  Eye   . DILATION AND CURETTAGE OF UTERUS  1980's  . EYE SURGERY Left 2014   Cataract  . FETAL BLOOD TRANSFUSION  09/16/11  . JOINT REPLACEMENT  04/13/10   Left Hip  . LUNG LOBECTOMY  1986   left  . SEPTOPLASTY    . TOTAL HIP ARTHROPLASTY  01/2010   left  . TUBAL LIGATION  1980's     OB History   None      Home Medications    Prior to Admission medications   Medication Sig Start Date End Date Taking? Authorizing Provider  albuterol (PROVENTIL HFA;VENTOLIN HFA) 108 (90 Base) MCG/ACT inhaler Inhale 2 puffs into the lungs every 6 (six) hours as needed for wheezing or shortness of breath. 06/01/17  Yes Collene Gobble, MD  albuterol (PROVENTIL) (2.5 MG/3ML) 0.083% nebulizer solution USE 1 VIAL VIA NEBULIZER EVERY 6 HOURS AS NEEDED FOR WHEEZING OR SHORTNESS OF BREATH Patient taking differently: Take 2.5 mg by nebulization every 6 (six) hours as needed for wheezing or shortness of breath.  08/03/17  Yes Collene Gobble, MD  aspirin 81 MG tablet Take 81 mg by mouth daily.   Yes [provider]  busPIRone (BUSPAR) 15 MG tablet Take 15  mg by mouth daily.   Yes [provider]  Calcium Carbonate-Vit D-Min (CALTRATE PLUS PO) Take 1 tablet by mouth every other day.    Yes [provider]  furosemide (LASIX) 40 MG tablet Take 1 tablet (40 mg total) by mouth daily. 01/23/18 02/22/18 Yes Weaver, Scott T, PA-C  Guaifenesin 1200 MG TB12 Take 1 tablet (1,200 mg total) by mouth 2 (two) times daily. 11/30/17  Yes Collene Gobble, MD  HYDROcodone-acetaminophen (NORCO) 10-325 MG tablet Take 1 tablet by mouth 2 (two) times daily as needed. Patient taking differently: Take 1 tablet by mouth every 6 (six) hours as needed for moderate pain or severe pain.  01/12/18  Yes Collene Gobble, MD  metFORMIN (GLUCOPHAGE) 500 MG tablet Take 1 tablet (500 mg total) by mouth 2 (two) times daily. 12/15/17  Yes Hongalgi, Lenis Dickinson, MD  metoprolol tartrate (LOPRESSOR) 25 MG tablet Take 1 tablet (25 mg total) by mouth 2 (two) times daily. 01/23/18  Yes Weaver, Scott T, PA-C  omeprazole (PRILOSEC) 20 MG capsule Take 1 capsule (20 mg total) by mouth daily. 07/04/17  Yes [name], MD  potassium chloride (K-DUR,KLOR-CON) 10 MEQ tablet Take 1 tablet (10 mEq total) by mouth daily. 01/23/18  Yes Weaver, Scott T, PA-C  predniSONE (DELTASONE) 10 MG tablet Take 1 tablet (10 mg total) by mouth daily with breakfast. 01/13/18  Yes Martyn Ehrich, NP  TRELEGY ELLIPTA 100-62.5-25 MCG/INH AEPB INHALE 1 PUFF BY MOUTH ONCE DAILY Patient taking differently: Inhale 1 puff into the lungs daily.  09/26/17  Yes Collene Gobble, MD  valsartan (DIOVAN) 40 MG tablet Take 1 tablet (40 mg total) by mouth daily. 01/23/18  Yes Weaver, Scott T, PA-C  ACCU-CHEK AVIVA PLUS test strip USE AS DIRECTED TO CHECK BLOOD SUGAR THREE TIMES DAILY 11/14/17   [name], MD  ALPRAZolam Duanne Moron) 1 MG tablet TAKE 1 TABLET BY MOUTH 3 TIMES A DAY AS NEEDED FOR ANXIETY Patient not taking: Reported on 01/27/2018 11/28/17   [name], MD  Blood Glucose Monitoring Suppl (ACCU-CHEK AVIVA PLUS)  w/Device KIT Use as directed to check blood sugar Dx: E11.9, E11.49 04/21/16   Estill Dooms, MD  nitroGLYCERIN (NITROSTAT) 0.4 MG SL tablet  Place 1 tablet (0.4 mg total) under the tongue every 5 (five) minutes as needed. Patient taking differently: Place 0.4 mg under the tongue every 5 (five) minutes as needed for chest pain.  01/23/18   Richardson Dopp T, PA-C  ondansetron (ZOFRAN) 4 MG tablet Take 1 tablet (4 mg total) by mouth every 8 (eight) hours as needed for nausea or vomiting. 01/16/18   Martyn Ehrich, NP  OXYGEN Inhale 4 L into the lungs.    [provider]  Respiratory Therapy Supplies (FLUTTER) DEVI Use a directed 11/08/17   Martyn Ehrich, NP  senna (SENOKOT) 8.6 MG tablet Take 1 tablet by mouth 2 (two) times daily as needed for constipation.     [provider]  traMADol (ULTRAM) 50 MG tablet TAKE 2 TABLETS (100MG TOTAL) BY MOUTH EVERY 6 HOURS AS NEEDED FOR SEVERE PAIN Patient taking differently: Take 100 mg by mouth every 6 (six) hours as needed for moderate pain or severe pain.  07/25/17   [name], MD  calcium carbonate 200 MG capsule Take 250 mg by mouth daily. Does not take everyday  01/23/18  [provider]    Family History Family History  Problem Relation Age of Onset  . Bone cancer Father   . Cancer Father        Bone  . Diabetes Mother   . Deep vein thrombosis Mother   . Heart disease Mother        Heart Disease before age 58-  PVD  . Hyperlipidemia Mother   . Hypertension Mother   . Varicose Veins Mother   . Arthritis Daughter     Social History Social History   Tobacco Use  . Smoking status: Former Smoker    Packs/day: 1.00    Years: 40.00    Pack years: 40.00    Types: Cigarettes    Last attempt to quit: 02/10/1998    Years since quitting: 19.9  . Smokeless tobacco: Never Used  Substance Use Topics  . Alcohol use: No  . Drug use: No     Allergies   Iohexol; Other; Anoro ellipta [umeclidinium-vilanterol];  Codeine; and Protonix [pantoprazole sodium]   Review of Systems Review of Systems  Constitutional: Positive for chills, fatigue and fever.  HENT: Negative for congestion and rhinorrhea.   Respiratory: Positive for cough, sputum production, chest tightness and shortness of breath. Negative for hemoptysis, wheezing and stridor.   Cardiovascular: Positive for chest pain, palpitations and leg swelling. Negative for syncope.  Gastrointestinal: Positive for nausea. Negative for abdominal pain, constipation, diarrhea and vomiting.  Genitourinary: Negative for dysuria and flank pain.  Musculoskeletal: Negative for back pain, neck pain and neck stiffness.  Skin: Negative for rash and wound.  Neurological: Negative for light-headedness and headaches.  Psychiatric/Behavioral: Negative for agitation.  All other systems reviewed and are negative.    Physical Exam Updated Vital Signs BP (!) 167/67   Pulse (!) 108   Temp 98.9 F (37.2 C) (Oral)   Resp (!) 26   Ht _0  (1.676 m)   Wt 75.3 kg   SpO2 99%   BMI 26.79 kg/m   Physical Exam  Constitutional: She appears well-developed and well-nourished.  Non-toxic appearance. She does not appear ill. No distress.  HENT:  Head: Normocephalic and atraumatic.  Mouth/Throat: Oropharynx is clear and moist.  Eyes: Pupils are equal, round, and reactive to light. Conjunctivae are normal.  Neck: Neck supple.  Cardiovascular: Regular rhythm. Tachycardia present.  Murmur heard.  Pulmonary/Chest: Effort normal. Tachypnea noted. No respiratory distress. She has rales in the right lower field and the left lower field.  Abdominal: Soft. There is no tenderness.  Musculoskeletal:       Right lower leg: She exhibits edema.       Left lower leg: She exhibits edema.  Neurological: She is alert.  Skin: Skin is warm and dry. Capillary refill takes less than 2 seconds. No erythema.  Psychiatric: She has a normal mood and affect.  Nursing note and vitals  reviewed.    ED Treatments / Results  Labs (all labs ordered are listed, but only abnormal results are displayed) Labs Reviewed  CBC WITH DIFFERENTIAL/PLATELET - Abnormal; Notable for the following components:      Result Value   RBC 3.56 (*)    Hemoglobin 10.2 (*)    HCT 33.4 (*)    Neutro Abs 8.1 (*)    All other components within normal limits  COMPREHENSIVE METABOLIC PANEL - Abnormal; Notable for the following components:   Glucose, Bld 139 (*)    AST 13 (*)    All other components within normal limits  MAGNESIUM - Abnormal; Notable for the following components:   Magnesium 1.3 (*)    All other components within normal limits  URINALYSIS, ROUTINE W REFLEX MICROSCOPIC - Abnormal; Notable for the following components:   Color, Urine STRAW (*)    All other components within normal limits  GLUCOSE, CAPILLARY - Abnormal; Notable for the following components:   Glucose-Capillary 169 (*)    All other components within normal limits  URINE CULTURE  CULTURE, BLOOD (ROUTINE X 2)  CULTURE, BLOOD (ROUTINE X 2)  BRAIN NATRIURETIC PEPTIDE  PROTIME-INR  INFLUENZA PANEL BY PCR (TYPE A & B)  LEGIONELLA PNEUMOPHILA SEROGP 1 UR AG  BASIC METABOLIC PANEL  CBC  I-STAT TROPONIN, ED  I-STAT TROPONIN, ED    EKG EKG Interpretation  Date/Time:  Friday January 27 2018 13:29:35 EDT Ventricular Rate:  107 PR Interval:    QRS Duration: 72 QT Interval:  323 QTC Calculation: 431 R Axis:   35 Text Interpretation:  Sinus tachycardia Abnormal R-wave progression, early transition When compared to prior, faster rate.  No STEMI Confirmed by Antony Blackbird [phone]) on 01/27/2018 3:09:09 PM   Radiology Dg Chest 2 View  Result Date: 01/27/2018 CLINICAL DATA:  Shortness of breath today.  History of lung cancer. EXAM: CHEST - 2 VIEW COMPARISON:  Chest x-rays dated 01/10/2017 and 12/13/2017. Chest CT dated 11/10/2017. FINDINGS: Slightly increased extent/density of the RIGHT upper lobe  mass/consolidation, with associated RIGHT hilar lymphadenopathy. Persistent small RIGHT pleural effusion. New small rounded opacity within the LEFT upper lung, presumably rounded atelectasis or early developing pneumonia. No pneumothorax seen. Heart size and mediastinal contours appear stable. No acute appearing osseous abnormality. IMPRESSION: 1. Slightly increased extent/density of the RIGHT upper lobe mass/consolidation, with associated RIGHT hilar lymphadenopathy, better demonstrated on earlier chest CT of 11/10/2017. 2. Small RIGHT pleural effusion, stable. 3. New small subtle rounded opacity within the LEFT upper lung. This likely represents a small focus of rounded atelectasis or early developing pneumonia, with progressive metastatic disease considered less likely given the short-term interval since previous chest x-ray. Electronically Signed   By: Franki Cabot M.D.   On: 01/27/2018 16:31    Procedures Procedures (including critical care time)  CRITICAL CARE Performed by: Gwenyth Allegra [place] Wolfert Total critical care time: 35 minutes Critical care time was exclusive of separately billable procedures and treating other patients.  Critical care was necessary to treat or prevent imminent or life-threatening deterioration. Critical care was time spent personally by me on the following activities: development of treatment plan with patient and/or surrogate as well as nursing, discussions with consultants, evaluation of patient's response to treatment, examination of patient, obtaining history from patient or surrogate, ordering and performing treatments and interventions, ordering and review of laboratory studies, ordering and review of radiographic studies, pulse oximetry and re-evaluation of patient's condition.   Medications Ordered in ED Medications  aspirin EC tablet 81 mg (has no administration in time range)  HYDROcodone-acetaminophen (NORCO) 10-325 MG per tablet 1 tablet (has no  administration in time range)  traMADol (ULTRAM) tablet 100 mg (has no administration in time range)  furosemide (LASIX) tablet 40 mg (has no administration in time range)  metoprolol tartrate (LOPRESSOR) tablet 25 mg (25 mg Oral Given 01/27/18 2253)  irbesartan (AVAPRO) tablet 75 mg (has no administration in time range)  busPIRone (BUSPAR) tablet 15 mg (15 mg Oral Given 01/27/18 2253)  predniSONE (DELTASONE) tablet 40 mg (has no administration in time range)  pantoprazole (PROTONIX) EC tablet 40 mg (has no administration in time range)  senna (SENOKOT) tablet 8.6 mg (has no administration in time range)  ondansetron (ZOFRAN) tablet 4 mg (has no administration in time range)  albuterol (PROVENTIL) (2.5 MG/3ML) 0.083% nebulizer solution 2.5 mg (2.5 mg Nebulization Given 01/27/18 2213)  enoxaparin (LOVENOX) injection 40 mg (40 mg Subcutaneous Given 01/27/18 2252)  sodium chloride flush (NS) 0.9 % injection 3 mL (3 mLs Intravenous Given 01/27/18 2217)  sodium chloride flush (NS) 0.9 % injection 3 mL (has no administration in time range)  0.9 %  sodium chloride infusion (250 mLs Intravenous New Bag/Given 01/27/18 2254)  levofloxacin (LEVAQUIN) IVPB 750 mg (750 mg Intravenous New Bag/Given 01/27/18 2256)  ceFEPIme (MAXIPIME) 1 g in sodium chloride 0.9 % 100 mL IVPB (has no administration in time range)  umeclidinium bromide (INCRUSE ELLIPTA) 62.5 MCG/INH 1 puff (has no administration in time range)    And  fluticasone furoate-vilanterol (BREO ELLIPTA) 100-25 MCG/INH 1 puff (has no administration in time range)  ondansetron (ZOFRAN) injection 4 mg (4 mg Intravenous Given 01/27/18 1547)  ceFEPIme (MAXIPIME) 2 g in sodium chloride 0.9 % 100 mL IVPB (0 g Intravenous Stopped 01/27/18 1912)     Initial Impression / Assessment and Plan / ED Course  I have reviewed the triage vital signs and the nursing notes.  Pertinent labs & imaging results that were available during my care of the patient were  reviewed by me and considered in my medical decision making (see chart for details).     [name] is a 72 y.o. female with a past medical history significant for COPD, hypertension, hyperlipidemia, lung cancer with recent pleural effusion requiring thoracentesis, prior A. fib, CAD, CHF, diabetes, and documentation of prior DVT not on anticoagulation, and fibromyalgia who presents with worsening fatigue, shortness of breath, chest pain, and peripheral edema.  Patient reports that she was admitted several weeks ago for fluid around her lung that was drained off.  She says that she had a good week with an over the last 2 weeks has had worsening shortness of breath.  She reports having chest tightness and some chest pressure.  She reports it is in her left chest.  She describes it as nonradiating.  She reports associated palpitations.  She reports is very short of breath that is exertional.  She reports a productive cough with some  yellow sputum.  She reports subjective fevers and chills.  She reports that her legs have been swelling progressively for the last 2 weeks.    On exam, patient has no wheezing but has crackles in the bases of her lungs.  Chest was nontender.  Legs were edematous on exam.  Patient's abdomen and chest are nontender.  Clinically I am most concerned about recurrent pleural effusion versus worsening fluid overload in her legs and lungs, or pulmonary embolism.  Work-up shows the patient had a PE study several months ago that was negative however chart shows that she is allergic to iohexol contrast but tolerates a 13-hour protocol.  Due to the patient's tachycardia, chest pain, exertional shortness of breath, documented prior DVT despite patient's lack of knowledge of this, and her lung cancer I am concerned about pulmonary embolism.   We will have discussion with patient and likely admitting hospitalist team as to necessity of heparinization while patient awaits a 13-hour  protocol and PE study however I suspect with the worsening fluid overload and tachycardia with symptoms she will need admission regardless.  6:05 PM Patient's work-up revealed evidence of new pneumonia on chest x-ray when compared to 1 several weeks ago.  Given the patient's productive cough, chills, shortness of breath, chest pain, tachycardia, and x-ray showing pneumonia, patient will be treated for H CAP.  BNP not elevated.  Troponin negative.  Next  Will discuss with hospitalist team about anticoagulation versus doing the prolonged protocol for PET scan however given the discovery of an abnormality that fits with symptoms, will hold on anticoagulants at this time.  Will be admitted for further management.  Final Clinical Impressions(s) / ED Diagnoses   Final diagnoses:  HCAP (healthcare-associated pneumonia)  SOB (shortness of breath)  Chest pain, unspecified type  Tachycardia    ED Discharge Orders    None     Clinical Impression: 1. HCAP (healthcare-associated pneumonia)   2. SOB (shortness of breath)   3. Chest pain, unspecified type   4. Tachycardia     Disposition: Admit  This note was prepared with assistance of Dragon voice recognition software. Occasional wrong-word or sound-a-like substitutions may have occurred due to the inherent limitations of voice recognition software.     Samiah Ricklefs, Gwenyth Allegra, MD 01/28/18 0005

## 2018-01-27 NOTE — ED Notes (Signed)
ED TO INPATIENT HANDOFF REPORT  Name/Age/Gender West Carbo Zandi 72 y.o. female  Code Status Code Status History    Date Active Date Inactive Code Status Order ID Comments User Context   12/09/2017 2343 12/15/2017 1742 DNR [id number]  Toy [place], MD Inpatient   08/13/2011 2158 08/16/2011 1930 Full Code [id number]  Martie Round, RN Inpatient    Questions for Most Recent Historical Code Status (Order [id number])    Question Answer Comment   In the event of cardiac or respiratory ARREST Do not call a "code blue"    In the event of cardiac or respiratory ARREST Do not perform Intubation, CPR, defibrillation or ACLS    In the event of cardiac or respiratory ARREST Use medication by any route, position, wound care, and other measures to relive pain and suffering. May use oxygen, suction and manual treatment of airway obstruction as needed for comfort.       Home/SNF/Other Home  Chief Complaint shortness of breath  Level of Care/Admitting Diagnosis ED Disposition    ED Disposition Condition Comment   Admit  Hospital Area: [facility] [[id number]]  Level of Care: Med-Surg [16]  Diagnosis: Community acquired pneumonia [[id number]]  Admitting Physician: Eston Esters  Attending Physician: Gwynne Edinger [[id number]]  Estimated length of stay: past midnight tomorrow  Certification:: I certify this patient will need inpatient services for at least 2 midnights  PT Class (Do Not Modify): Inpatient [101]  PT Acc Code (Do Not Modify): Private [1]       Medical History Past Medical History:  Diagnosis Date  . 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 Anemia in neoplastic disease   . Anxiety   . Arthritis    "all over my body"  . Asthma   . Asymptomatic varicose veins   . Atherosclerosis of native arteries of the extremities, unspecified   . Atrial fibrillation ([place])   . Bronchial pneumonia    history  . 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 Chemotherapy adverse reaction 09/15/11   "makes me light headed and pass out; this is 3rd blood transfusion since going thru it"  . CHF (congestive heart failure) ([place])   . Chronic airway obstruction, not elsewhere classified   . Chronic fatigue 08/19/2015  . Chronic sinusitis   . Complication of anesthesia    slow to wake up  . COPD with emphysema ([place])   . Coronary artery disease    Dr. Burt Knack had stress test done  . DDD (degenerative disc disease), lumbar   . Depressive disorder, not elsewhere classified   . Diseases of lips   . Disorder of bone and cartilage, unspecified   . DVT (deep venous thrombosis) ([place])   . Fibromyalgia   . Functional urinary incontinence   . GERD (gastroesophageal reflux disease)   . Hard of hearing, right   . Herpes zoster without mention of complication   . Hip pain, chronic 09/17/2014  . History of blood clots    "2 in my aorta"  . History of blood transfusion   . History of bronchitis   . History of echocardiogram    Echo 11/18: mild LVH, EF 60-65, no RWMA, Gr 1 DD, Ao Sclerosis, mild AI, RVH with low nl RSVF, trivial eff  . Hx of radiation therapy 04/28/11 -06/08/11   right upper lobe-lung  . Hyperlipidemia   . Hypertension   . Kidney infection    "related to  chemo"  . Lumbago   . Malignant neoplasm of bronchus and lung, unspecified site   . Mental disorder   . Muscle weakness (generalized)   . Non-small cell lung cancer ([place]) 03/31/2011   Adenocarcinoma Rt upper lobe mass  . Osteoarthrosis, unspecified whether generalized or localized, unspecified site   . Osteoporosis   . Other abnormal blood chemistry   . Other and unspecified hyperlipidemia   . PAD (peripheral artery disease) ([place])   . Pain in thoracic spine   . 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 Primary cancer of right upper lobe of lung ([place]) 02/08/2011   Biopsy Rt upper lobe mass 03/31/11>>adenocarcinoma DIAGNOSIS: Stage IIB/IIIA non-small cell lung cancer consistent with  adenocarcinoma diagnosed in December 2012.   PRIOR THERAPY:  1) Status post radiation therapy to the right upper lobe lung mass completed on 06/04/2011 under the care of Dr. Pablo Ledger.  2) Systemic chemotherapy with carboplatin for AUC of 5 and Alimta 500 mg/M2 giving every 3   . Reflux   . Reflux esophagitis   . Respiratory failure with hypoxia 01/02/2010  . Restless legs syndrome (RLS)   . Scoliosis   . Screening for thyroid disorder   . Shortness of breath    "anytime really"  . Sinus headache    "all the time"  . Spasm of muscle   . Syncope and collapse   . Type II or unspecified type diabetes mellitus with neurological manifestations, uncontrolled(250.62)   . Type II or unspecified type diabetes mellitus without mention of complication, not stated as uncontrolled   . Type II or unspecified type diabetes mellitus without mention of complication, not stated as uncontrolled   . Unspecified constipation   . Unspecified essential hypertension   . Unspecified hearing loss   . Unspecified hereditary and idiopathic peripheral neuropathy   . Unspecified vitamin D deficiency     Allergies Allergies  Allergen Reactions  . Iohexol Shortness Of Breath and Other (See Comments)    "burns me up inside"    Pt does fine with 13 hour prep  01/17/12  . Other     CAN'T STAND THE SMELL OF PURE BLEACH; "HAVE TO BACK UP AND POUR AND DON'T BREATH IT TIL i GET CAP BACK ON; DILUTE IT TO SPRAY"  . Anoro Ellipta [Umeclidinium-Vilanterol] Swelling    Swelling of lips and mouth  . Codeine Itching and Nausea And Vomiting    REACTION: GI upset  . Protonix [Pantoprazole Sodium] Diarrhea    IV Location/Drains/Wounds Patient Lines/Drains/Airways Status   Active Line/Drains/Airways    Name:   Placement date:   Placement time:   Site:   Days:   Peripheral IV 01/27/18 Left Forearm   01/27/18    1550    Forearm   less than 1          Labs/Imaging Results for orders placed or performed during the  hospital encounter of 01/27/18 (from the past 48 hour(s))  CBC with Differential     Status: Abnormal   Collection Time: 01/27/18  3:26 PM  Result Value Ref Range   WBC 9.5 4.0 - 10.5 K/uL   RBC 3.56 (L) 3.87 - 5.11 MIL/uL   Hemoglobin 10.2 (L) 12.0 - 15.0 g/dL   HCT 33.4 (L) 36.0 - 46.0 %   MCV 93.8 80.0 - 100.0 fL   MCH 28.7 26.0 - 34.0 pg   MCHC 30.5 30.0 - 36.0 g/dL   RDW 14.6 11.5 - 15.5 %   Platelets 237  150 - 400 K/uL   nRBC 0.0 0.0 - 0.2 %   Neutrophils Relative % 86 %   Neutro Abs 8.1 (H) 1.7 - 7.7 K/uL   Lymphocytes Relative 10 %   Lymphs Abs 1.0 0.7 - 4.0 K/uL   Monocytes Relative 4 %   Monocytes Absolute 0.4 0.1 - 1.0 K/uL   Eosinophils Relative 0 %   Eosinophils Absolute 0.0 0.0 - 0.5 K/uL   Basophils Relative 0 %   Basophils Absolute 0.0 0.0 - 0.1 K/uL   Immature Granulocytes 0 %   Abs Immature Granulocytes 0.02 0.00 - 0.07 K/uL    Comment: Performed at [facility], [place] [address] [place] [address], [place], [place] [address]  Comprehensive metabolic panel     Status: Abnormal   Collection Time: 01/27/18  3:26 PM  Result Value Ref Range   Sodium 143 135 - 145 mmol/L   Potassium 3.5 3.5 - 5.1 mmol/L   Chloride 101 98 - 111 mmol/L   CO2 32 22 - 32 mmol/L   Glucose, Bld 139 (H) 70 - 99 mg/dL   BUN 12 8 - 23 mg/dL   Creatinine, Ser 0.78 0.44 - 1.00 mg/dL   Calcium 9.0 8.9 - 10.3 mg/dL   Total Protein 6.6 6.5 - 8.1 g/dL   Albumin 3.6 3.5 - 5.0 g/dL   AST 13 (L) 15 - 41 U/L   ALT 10 0 - 44 U/L   Alkaline Phosphatase 80 38 - 126 U/L   Total Bilirubin 0.6 0.3 - 1.2 mg/dL   GFR calc non Af Amer >60 >60 mL/min   GFR calc Af Amer >60 >60 mL/min    Comment: (NOTE) The eGFR has been calculated using the CKD EPI equation. This calculation has not been validated in all clinical situations. eGFR's persistently <60 mL/min signify possible Chronic Kidney Disease.    Anion gap 10 5 - 15    Comment: Performed at [facility], [place] [address]  [place], [place] [address]  Brain natriuretic peptide     Status: None   Collection Time: 01/27/18  3:26 PM  Result Value Ref Range   B Natriuretic Peptide 39.5 0.0 - 100.0 pg/mL    Comment: Performed at [facility], [place] [address] [place], [place] [address]  Protime-INR     Status: None   Collection Time: 01/27/18  3:26 PM  Result Value Ref Range   Prothrombin Time 11.8 11.4 - 15.2 seconds   INR 0.88     Comment: Performed at [facility], [place] [address] [place], [place] [address]  Magnesium     Status: Abnormal   Collection Time: 01/27/18  3:26 PM  Result Value Ref Range   Magnesium 1.3 (L) 1.7 - 2.4 mg/dL    Comment: Performed at [facility], [place] [address] [place], [place] [address]  I-stat troponin, ED     Status: None   Collection Time: 01/27/18  3:53 PM  Result Value Ref Range   Troponin i, poc 0.00 0.00 - 0.08 ng/mL   Comment 3            Comment: Due to the release kinetics of cTnI, a negative result within the first hours of the onset of symptoms does not rule out myocardial infarction with certainty. If myocardial infarction is still suspected, repeat the test at appropriate intervals.   Urinalysis, Routine w reflex microscopic     Status: Abnormal   Collection Time: 01/27/18  4:05 PM  Result Value Ref Range   Color, Urine STRAW (A) YELLOW   APPearance CLEAR CLEAR   Specific Gravity, Urine 1.006 1.005 - 1.030   pH 8.0 5.0 - 8.0   Glucose, UA NEGATIVE NEGATIVE mg/dL   Hgb urine dipstick NEGATIVE NEGATIVE   Bilirubin Urine NEGATIVE NEGATIVE   Ketones, ur NEGATIVE NEGATIVE mg/dL   Protein, ur NEGATIVE NEGATIVE mg/dL   Nitrite NEGATIVE NEGATIVE   Leukocytes, UA NEGATIVE NEGATIVE    Comment: Performed at [place] [address], [place], [place] [address]  I-stat troponin, ED     Status: None   Collection Time: 01/27/18  6:29 PM  Result Value Ref Range   Troponin i, poc 0.00 0.00 -  0.08 ng/mL   Comment 3            Comment: Due to the release kinetics of cTnI, a negative result within the first hours of the onset of symptoms does not rule out myocardial infarction with certainty. If myocardial infarction is still suspected, repeat the test at appropriate intervals.    *Note: Due to a large number of results and/or encounters for the requested time period, some results have not been displayed. A complete set of results can be found in Results Review.   Dg Chest 2 View  Result Date: 01/27/2018 CLINICAL DATA:  Shortness of breath today.  History of lung cancer. EXAM: CHEST - 2 VIEW COMPARISON:  Chest x-rays dated 01/10/2017 and 12/13/2017. Chest CT dated 11/10/2017. FINDINGS: Slightly increased extent/density of the RIGHT upper lobe mass/consolidation, with associated RIGHT hilar lymphadenopathy. Persistent small RIGHT pleural effusion. New small rounded opacity within the LEFT upper lung, presumably rounded atelectasis or early developing pneumonia. No pneumothorax seen. Heart size and mediastinal contours appear stable. No acute appearing osseous abnormality. IMPRESSION: 1. Slightly increased extent/density of the RIGHT upper lobe mass/consolidation, with associated RIGHT hilar lymphadenopathy, better demonstrated on earlier chest CT of 11/10/2017. 2. Small RIGHT pleural effusion, stable. 3. New small subtle rounded opacity within the LEFT upper lung. This likely represents a small focus of rounded atelectasis or early developing pneumonia, with progressive metastatic disease considered less likely given the short-term interval since previous chest x-ray. Electronically Signed   By: Franki Cabot M.D.   On: 01/27/2018 16:31   EKG Interpretation  Date/Time:  Friday January 27 2018 13:29:35 EDT Ventricular Rate:  107 PR Interval:    QRS Duration: 72 QT Interval:  323 QTC Calculation: 431 R Axis:   35 Text Interpretation:  Sinus tachycardia Abnormal R-wave progression,  early transition When compared to prior, faster rate.  No STEMI Confirmed by Antony Blackbird [phone]) on 01/27/2018 3:09:09 PM   Pending Labs Unresulted Labs (From admission, onward)    Start     Ordered   01/27/18 1928  Influenza panel by PCR (type A & B)  Once,   R     01/27/18 1927   01/27/18 1928  Legionella Pneumophila Serogp 1 Ur Ag  Once,   R     01/27/18 1927   01/27/18 1806  Blood culture (routine x 2)  BLOOD CULTURE X 2,   STAT     01/27/18 1805   01/27/18 1532  Urine culture  STAT,   STAT     01/27/18 1532   Signed and Held  CBC  (enoxaparin (LOVENOX)    CrCl >/= 30 ml/min)  Once,   R    Comments:  Baseline for enoxaparin therapy IF NOT ALREADY  DRAWN.  Notify MD if PLT < 100 K.    Signed and Held   Signed and Held  Creatinine, serum  (enoxaparin (LOVENOX)    CrCl >/= 30 ml/min)  Once,   R    Comments:  Baseline for enoxaparin therapy IF NOT ALREADY DRAWN.    Signed and Held   Signed and Held  Creatinine, serum  (enoxaparin (LOVENOX)    CrCl >/= 30 ml/min)  Weekly,   R    Comments:  while on enoxaparin therapy    Signed and Held   Signed and Held  Basic metabolic panel  Tomorrow morning,   R     Signed and Held   Signed and Held  CBC  Tomorrow morning,   R     Signed and Held   Signed and Held  Glucose, random  Daily,   R     Signed and Held          Vitals/Pain Today's Vitals   01/27/18 1500 01/27/18 1530 01/27/18 1845 01/27/18 1900  BP: (!) 167/67 (!) 172/68 (!) 147/67 (!) 139/102  Pulse: (!) 108 (!) 110 (!) 103 (!) 115  Resp: (!) 26 (!) 22 (!) 23 (!) 30  Temp:      TempSrc:      SpO2: 99% 98% 98% 100%  Weight:      Height:      PainSc:        Isolation Precautions No active isolations  Medications Medications  vancomycin (VANCOCIN) 1,500 mg in sodium chloride 0.9 % 500 mL IVPB (1,500 mg Intravenous New Bag/Given 01/27/18 1933)  levofloxacin (LEVAQUIN) IVPB 750 mg (has no administration in time range)  ceFEPIme (MAXIPIME) 1 g in sodium chloride 0.9  % 100 mL IVPB (has no administration in time range)  ondansetron (ZOFRAN) injection 4 mg (4 mg Intravenous Given 01/27/18 1547)  ceFEPIme (MAXIPIME) 2 g in sodium chloride 0.9 % 100 mL IVPB (0 g Intravenous Stopped 01/27/18 1912)    Mobility walks with person assist

## 2018-01-27 NOTE — ED Triage Notes (Signed)
Per EMS- patient is from home. Patient c/o SOB since 0200 today. Patient has a history of lung cancer. Patient reported that she is not doing any treatment. Patient had a thoracentesis 3 weeks ago where she had 1 liter drawn off of the right lung.

## 2018-01-27 NOTE — H&P (Addendum)
History and Physical    [name] DOB: 1945-08-23 DOA: 01/27/2018  PCP: [name], DO  Patient coming from: home   Chief Complaint: shortness ofbreath  HPI: [name] is a 72 y.o. 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  Admission one month ago for dCHF/copd exacerbation.  Felt more or less back to baseline after discharge, but for past couple of weeks has noticed more fatigue than normal, worsened dyspnea on exertion. Also has intermittent cough. Stable 3 l home o2. Awakens at night feeling very short of breath. No worsening orthopnea. Leg swelling is more or less at baseline. Cough is mostly dry, but sometimes productive. No fevers. Was treated with abx last month. No new leg swelling. No blood in cough. No chest pain. No dysuria. No recent med changes.  ED Course: labs, antibiotics, cxr  Review of Systems: As per HPI otherwise 10 point review of systems negative.    Past Medical History:  Diagnosis Date  . Adenocarcinoma, lung ([place])    upper left lobe  . Anemia in neoplastic disease   . Anxiety   . Arthritis    "all over my body"  . Asthma   . Asymptomatic varicose veins   . Atherosclerosis of native arteries of the extremities, unspecified   . Atrial fibrillation ([place])   . Bronchial pneumonia    history  . 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 Chemotherapy adverse reaction 09/15/11   "makes me light headed and pass out; this is 3rd blood transfusion since going thru it"  . CHF (congestive heart failure) ([place])   . Chronic airway obstruction, not elsewhere classified   . Chronic fatigue 08/19/2015  . Chronic sinusitis   . Complication of anesthesia    slow to wake up  . COPD with emphysema ([place])   . Coronary artery  disease    Dr. Burt Knack had stress test done  . DDD (degenerative disc disease), lumbar   . Depressive disorder, not elsewhere classified   . Diseases of lips   . Disorder of bone and cartilage, unspecified   . DVT (deep venous thrombosis) ([place])   . Fibromyalgia   . Functional urinary incontinence   . GERD (gastroesophageal reflux disease)   . Hard of hearing, right   . Herpes zoster without mention of complication   . Hip pain, chronic 09/17/2014  . History of blood clots    "2 in my aorta"  . History of blood transfusion   . History of bronchitis   . History of echocardiogram    Echo 11/18: mild LVH, EF 60-65, no RWMA, Gr 1 DD, Ao Sclerosis, mild AI, RVH with low nl RSVF, trivial eff  . Hx of radiation therapy 04/28/11 -06/08/11   right upper lobe-lung  . Hyperlipidemia   . Hypertension   . Kidney infection    "related to chemo"  . Lumbago   . Malignant neoplasm of bronchus and lung, unspecified site   . Mental disorder   . Muscle weakness (generalized)   . Non-small cell lung cancer ([place]) 03/31/2011   Adenocarcinoma Rt upper lobe mass  . Osteoarthrosis, unspecified whether generalized or localized, unspecified site   . Osteoporosis   . Other abnormal blood chemistry   . Other and unspecified hyperlipidemia   . PAD (peripheral artery  disease) ([place])   . Pain in thoracic spine   . 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 Primary cancer of right upper lobe of lung ([place]) 02/08/2011   Biopsy Rt upper lobe mass 03/31/11>>adenocarcinoma DIAGNOSIS: Stage IIB/IIIA non-small cell lung cancer consistent with adenocarcinoma diagnosed in December 2012.   PRIOR THERAPY:  1) Status post radiation therapy to the right upper lobe lung mass completed on 06/04/2011 under the care of Dr. Pablo Ledger.  2) Systemic chemotherapy with carboplatin for AUC of 5 and Alimta 500 mg/M2 giving every 3   . Reflux   . Reflux esophagitis   . Respiratory failure with hypoxia 01/02/2010  . Restless legs syndrome  (RLS)   . Scoliosis   . Screening for thyroid disorder   . Shortness of breath    "anytime really"  . Sinus headache    "all the time"  . Spasm of muscle   . Syncope and collapse   . Type II or unspecified type diabetes mellitus with neurological manifestations, uncontrolled(250.62)   . Type II or unspecified type diabetes mellitus without mention of complication, not stated as uncontrolled   . Type II or unspecified type diabetes mellitus without mention of complication, not stated as uncontrolled   . Unspecified constipation   . Unspecified essential hypertension   . Unspecified hearing loss   . Unspecified hereditary and idiopathic peripheral neuropathy   . Unspecified vitamin D deficiency     Past Surgical History:  Procedure Laterality Date  . BRONCHOSCOPY  02/2011  . CARDIAC CATHETERIZATION    . CATARACT EXTRACTION  03/12/2013   Left Eye   . DILATION AND CURETTAGE OF UTERUS  1980's  . EYE SURGERY Left 2014   Cataract  . FETAL BLOOD TRANSFUSION  09/16/11  . JOINT REPLACEMENT  04/13/10   Left Hip  . LUNG LOBECTOMY  1986   left  . SEPTOPLASTY    . TOTAL HIP ARTHROPLASTY  01/2010   left  . TUBAL LIGATION  1980's     reports that she quit smoking about 19 years ago. Her smoking use included cigarettes. She has a 40.00 pack-year smoking history. She has never used smokeless tobacco. She reports that she does not drink alcohol or use drugs.  Allergies  Allergen Reactions  . Iohexol Shortness Of Breath and Other (See Comments)    "burns me up inside"    Pt does fine with 13 hour prep  01/17/12  . Other     CAN'T STAND THE SMELL OF PURE BLEACH; "HAVE TO BACK UP AND POUR AND DON'T BREATH IT TIL i GET CAP BACK ON; DILUTE IT TO SPRAY"  . Anoro Ellipta [Umeclidinium-Vilanterol] Swelling    Swelling of lips and mouth  . Codeine Itching and Nausea And Vomiting    REACTION: GI upset  . Protonix [Pantoprazole Sodium] Diarrhea    Family History  Problem Relation Age of Onset  .  Bone cancer Father   . Cancer Father        Bone  . Diabetes Mother   . Deep vein thrombosis Mother   . Heart disease Mother        Heart Disease before age 71-  PVD  . Hyperlipidemia Mother   . Hypertension Mother   . Varicose Veins Mother   . Arthritis Daughter     Prior to Admission medications   Medication Sig Start Date End Date Taking? Authorizing Provider  albuterol (PROVENTIL HFA;VENTOLIN HFA) 108 (90 Base) MCG/ACT inhaler Inhale 2 puffs into the  lungs every 6 (six) hours as needed for wheezing or shortness of breath. 06/01/17  Yes Collene Gobble, MD  albuterol (PROVENTIL) (2.5 MG/3ML) 0.083% nebulizer solution USE 1 VIAL VIA NEBULIZER EVERY 6 HOURS AS NEEDED FOR WHEEZING OR SHORTNESS OF BREATH Patient taking differently: Take 2.5 mg by nebulization every 6 (six) hours as needed for wheezing or shortness of breath.  08/03/17  Yes Collene Gobble, MD  aspirin 81 MG tablet Take 81 mg by mouth daily.   Yes [provider]  busPIRone (BUSPAR) 15 MG tablet Take 15 mg by mouth daily.   Yes [provider]  Calcium Carbonate-Vit D-Min (CALTRATE PLUS PO) Take 1 tablet by mouth every other day.    Yes [provider]  furosemide (LASIX) 40 MG tablet Take 1 tablet (40 mg total) by mouth daily. 01/23/18 02/22/18 Yes Weaver, Scott T, PA-C  Guaifenesin 1200 MG TB12 Take 1 tablet (1,200 mg total) by mouth 2 (two) times daily. 11/30/17  Yes Collene Gobble, MD  HYDROcodone-acetaminophen (NORCO) 10-325 MG tablet Take 1 tablet by mouth 2 (two) times daily as needed. Patient taking differently: Take 1 tablet by mouth every 6 (six) hours as needed for moderate pain or severe pain.  01/12/18  Yes Collene Gobble, MD  metFORMIN (GLUCOPHAGE) 500 MG tablet Take 1 tablet (500 mg total) by mouth 2 (two) times daily. 12/15/17  Yes Hongalgi, Lenis Dickinson, MD  metoprolol tartrate (LOPRESSOR) 25 MG tablet Take 1 tablet (25 mg total) by mouth 2 (two) times daily. 01/23/18  Yes Weaver, Scott T,  PA-C  omeprazole (PRILOSEC) 20 MG capsule Take 1 capsule (20 mg total) by mouth daily. 07/04/17  Yes [name], MD  potassium chloride (K-DUR,KLOR-CON) 10 MEQ tablet Take 1 tablet (10 mEq total) by mouth daily. 01/23/18  Yes Weaver, Scott T, PA-C  predniSONE (DELTASONE) 10 MG tablet Take 1 tablet (10 mg total) by mouth daily with breakfast. 01/13/18  Yes Martyn Ehrich, NP  TRELEGY ELLIPTA 100-62.5-25 MCG/INH AEPB INHALE 1 PUFF BY MOUTH ONCE DAILY Patient taking differently: Inhale 1 puff into the lungs daily.  09/26/17  Yes Collene Gobble, MD  valsartan (DIOVAN) 40 MG tablet Take 1 tablet (40 mg total) by mouth daily. 01/23/18  Yes Weaver, Scott T, PA-C  ACCU-CHEK AVIVA PLUS test strip USE AS DIRECTED TO CHECK BLOOD SUGAR THREE TIMES DAILY 11/14/17   [name], MD  ALPRAZolam Duanne Moron) 1 MG tablet TAKE 1 TABLET BY MOUTH 3 TIMES A DAY AS NEEDED FOR ANXIETY Patient not taking: Reported on 01/27/2018 11/28/17   [name], MD  Blood Glucose Monitoring Suppl (ACCU-CHEK AVIVA PLUS) w/Device KIT Use as directed to check blood sugar Dx: E11.9, E11.49 04/21/16   Estill Dooms, MD  nitroGLYCERIN (NITROSTAT) 0.4 MG SL tablet Place 1 tablet (0.4 mg total) under the tongue every 5 (five) minutes as needed. Patient taking differently: Place 0.4 mg under the tongue every 5 (five) minutes as needed for chest pain.  01/23/18   Richardson Dopp T, PA-C  ondansetron (ZOFRAN) 4 MG tablet Take 1 tablet (4 mg total) by mouth every 8 (eight) hours as needed for nausea or vomiting. 01/16/18   Martyn Ehrich, NP  OXYGEN Inhale 4 L into the lungs.    [provider]  Respiratory Therapy Supplies (FLUTTER) DEVI Use a directed 11/08/17   Martyn Ehrich, NP  senna (SENOKOT) 8.6 MG tablet Take 1 tablet by mouth 2 (two) times daily  as needed for constipation.     [provider]  traMADol (ULTRAM) 50 MG tablet TAKE 2 TABLETS (100MG TOTAL) BY MOUTH EVERY 6 HOURS AS NEEDED FOR SEVERE PAIN Patient  taking differently: Take 100 mg by mouth every 6 (six) hours as needed for moderate pain or severe pain.  07/25/17   [name], MD  calcium carbonate 200 MG capsule Take 250 mg by mouth daily. Does not take everyday  01/23/18  [provider]    Physical Exam: Vitals:   01/27/18 1430 01/27/18 1500 01/27/18 1530 01/27/18 1845  BP: (!) 155/59 (!) 167/67 (!) 172/68 (!) 147/67  Pulse: (!) 106 (!) 108 (!) 110 (!) 103  Resp: 20 (!) 26 (!) 22 (!) 23  Temp:      TempSrc:      SpO2: 99% 99% 98% 98%  Weight:      Height:        Constitutional: No acute distress, chronically ill appearing Head: Atraumatic Eyes: Conjunctiva clear ENM: Moist mucous membranes. poordentition.  Neck: Supple Respiratory: rales at bases, decreased breath sounds right base, no wheeze or rhonchi Cardiovascular: tachycardic, reg rhythm, soft systolic murmur,ps. Abdomen: Non-tender, non-distended. No masses. No rebound or guarding. Positive bowel sounds. Musculoskeletal: No joint deformity upper and lower extremities. Normal ROM, no contractures. Normal muscle tone.  Skin: No rashes, lesions, or ulcers.  Extremities: warm, mod pitting edema to knees Neurologic: Alert, moving all 4 extremities. Psychiatric: Normal insight and judgement.   Labs on Admission: I have personally reviewed following labs and imaging studies  CBC: Recent Labs  Lab 01/27/18 1526  WBC 9.5  NEUTROABS 8.1*  HGB 10.2*  HCT 33.4*  MCV 93.8  PLT 222   Basic Metabolic Panel: Recent Labs  Lab 01/23/18 1406 01/27/18 1526  NA 144 143  K 3.6 3.5  CL 96 101  CO2 28 32  GLUCOSE 137* 139*  BUN 12 12  CREATININE 0.98 0.78  CALCIUM 9.0 9.0  MG  --  1.3*   GFR: Estimated Creatinine Clearance: 65 mL/min (by C-G formula based on SCr of 0.78 mg/dL). Liver Function Tests: Recent Labs  Lab 01/27/18 1526  AST 13*  ALT 10  ALKPHOS 80  BILITOT 0.6  PROT 6.6  ALBUMIN 3.6   No results for input(s): LIPASE, AMYLASE in the  last 168 hours. No results for input(s): AMMONIA in the last 168 hours. Coagulation Profile: Recent Labs  Lab 01/27/18 1526  INR 0.88   Cardiac Enzymes: No results for input(s): CKTOTAL, CKMB, CKMBINDEX, TROPONINI in the last 168 hours. BNP (last 3 results) Recent Labs    01/10/18 1146 01/23/18 1406  PROBNP 27.0 199   HbA1C: No results for input(s): HGBA1C in the last 72 hours. CBG: No results for input(s): GLUCAP in the last 168 hours. Lipid Profile: No results for input(s): CHOL, HDL, LDLCALC, TRIG, CHOLHDL, LDLDIRECT in the last 72 hours. Thyroid Function Tests: No results for input(s): TSH, T4TOTAL, FREET4, T3FREE, THYROIDAB in the last 72 hours. Anemia Panel: No results for input(s): VITAMINB12, FOLATE, FERRITIN, TIBC, IRON, RETICCTPCT in the last 72 hours. Urine analysis:    Component Value Date/Time   COLORURINE STRAW (A) 01/27/2018 [place] 01/27/2018 1605   LABSPEC 1.006 01/27/2018 1605   LABSPEC 1.020 06/18/2011 1547   PHURINE 8.0 01/27/2018 1605   GLUCOSEU NEGATIVE 01/27/2018 1605   GLUCOSEU NEGATIVE 01/13/2018 1553   HGBUR NEGATIVE 01/27/2018 1605   BILIRUBINUR NEGATIVE 01/27/2018 [place]  12/04/2013 1643   BILIRUBINUR Negative 06/18/2011 1547   [place] 01/27/2018 1605   PROTEINUR NEGATIVE 01/27/2018 1605   UROBILINOGEN 0.2 01/13/2018 1553   NITRITE NEGATIVE 01/27/2018 1605   LEUKOCYTESUR NEGATIVE 01/27/2018 1605   LEUKOCYTESUR Trace 06/18/2011 1547    Radiological Exams on Admission: Dg Chest 2 View  Result Date: 01/27/2018 CLINICAL DATA:  Shortness of breath today.  History of lung cancer. EXAM: CHEST - 2 VIEW COMPARISON:  Chest x-rays dated 01/10/2017 and 12/13/2017. Chest CT dated 11/10/2017. FINDINGS: Slightly increased extent/density of the RIGHT upper lobe mass/consolidation, with associated RIGHT hilar lymphadenopathy. Persistent small RIGHT pleural effusion. New small rounded opacity within the LEFT  upper lung, presumably rounded atelectasis or early developing pneumonia. No pneumothorax seen. Heart size and mediastinal contours appear stable. No acute appearing osseous abnormality. IMPRESSION: 1. Slightly increased extent/density of the RIGHT upper lobe mass/consolidation, with associated RIGHT hilar lymphadenopathy, better demonstrated on earlier chest CT of 11/10/2017. 2. Small RIGHT pleural effusion, stable. 3. New small subtle rounded opacity within the LEFT upper lung. This likely represents a small focus of rounded atelectasis or early developing pneumonia, with progressive metastatic disease considered less likely given the short-term interval since previous chest x-ray. Electronically Signed   By: Franki Cabot M.D.   On: 01/27/2018 16:31    EKG: Independently reviewed. Sinus tachycardia  Assessment/Plan Active Problems:   Essential hypertension   COPD (chronic obstructive pulmonary disease) ([facility])   Respiratory failure with hypoxia ([facility])   Primary cancer of right upper lobe of lung ([facility])   DM type 2 with diabetic peripheral neuropathy ([place])   Community acquired pneumonia   Pleural effusion   # Community acquired pneumonia - I am not wholly convinced of this diagnosis but admitting to treat thusly given worsening respiratory symptoms and new opacity in LUL in a frail adult, though very stable appearing on exam. Patient has severe multi-factorial pulmonary disease (copd, lung cancer, pleural effusion) and current symtptoms could represent waxing and waning course. No leukocytosis, is tachycardic but that is chronic for the patient, no fever, no sig increased sputum production. No sig wheezing or respiratory distress, do not think sig copd exacerbation co-occurring. bnp wnl and no sig change in edema or worsening pulm edema/effusion, do not think chf exacerbation is co occurring. Pt unable to give details of hx of dvt. No signs LE dvt on exam, pt is not anticoagulated. Apparently has  contrast intolerance and requires extended prep for CTA. - cefepime/levaquin given pseudomonal risk factors - cont home inhalers - increase daily prednisone to pulse dose 40 mg po qd (home dose is 10 qd) - cont home lasix - eval for LE dvt with LE dopplers; if neg and respiratory status fails to improve may need further PE eval with CTA - f/u flu panel and legionella urine antigen  # HTN - here bp elevated - substitute irbesartan for home valsartan; cont home metoprolol and lasix - ctm, may need dose escalation  # Oxygen dependent - o2 wnl on home o2 of 3L - [place] o2  # Chronic pain # Anxiety disorder - cont home prn tramadol and oxycodone and  Alprazolam  # T2DM - well controlled, a1c 5.8 11/2017, on low dose metformin - hold metformin while hospitalized - daily fasting sugars while on increased dose steroids  DVT prophylaxis: lovenox, ted hose Code Status: full code. Hx dnr, but patient clearly states "I want to change that" and son, who is in room, agrees and says that is  what patient wants. I clearly and at length discussed what full code vs dnr means, and pt chooses to be full code, is aware can change this if she chooses  Family Communication: daughter Etheleen Nicks [phone]  Disposition Plan: tbd  Consults called: none  Admission status: med/surg    Desma Maxim MD [facility] Pager [phone]  If 7PM-7AM, please contact night-coverage [email or web address] Password [email or web address]  01/27/2018, 7:12 PM

## 2018-01-27 NOTE — ED Notes (Signed)
Pt will be bringing personal oxygen tank to floor with pt. Pt forgot to add this to list of personal possessions while talking to the admitting nurse.

## 2018-01-28 ENCOUNTER — Inpatient Hospital Stay (HOSPITAL_COMMUNITY): Payer: Medicare Other

## 2018-01-28 DIAGNOSIS — K219 Gastro-esophageal reflux disease without esophagitis: Secondary | ICD-10-CM

## 2018-01-28 DIAGNOSIS — J9611 Chronic respiratory failure with hypoxia: Secondary | ICD-10-CM

## 2018-01-28 DIAGNOSIS — E876 Hypokalemia: Secondary | ICD-10-CM

## 2018-01-28 DIAGNOSIS — R0602 Shortness of breath: Secondary | ICD-10-CM

## 2018-01-28 LAB — BASIC METABOLIC PANEL
Anion gap: 12 (ref 5–15)
BUN: 15 mg/dL (ref 8–23)
CHLORIDE: 102 mmol/L (ref 98–111)
CO2: 27 mmol/L (ref 22–32)
CREATININE: 0.94 mg/dL (ref 0.44–1.00)
Calcium: 8.5 mg/dL — ABNORMAL LOW (ref 8.9–10.3)
GFR, EST NON AFRICAN AMERICAN: 59 mL/min — AB (ref >60)
Glucose, Bld: 155 mg/dL — ABNORMAL HIGH (ref 70–99)
POTASSIUM: 3.4 mmol/L — AB (ref 3.5–5.1)
SODIUM: 141 mmol/L (ref 135–145)

## 2018-01-28 LAB — MAGNESIUM: MAGNESIUM: 1.3 mg/dL — AB (ref 1.7–2.4)

## 2018-01-28 LAB — CBC
HEMATOCRIT: 32 % — AB (ref 36.0–46.0)
HEMOGLOBIN: 9.6 g/dL — AB (ref 12.0–15.0)
MCH: 28.8 pg (ref 26.0–34.0)
MCHC: 30 g/dL (ref 30.0–36.0)
MCV: 96.1 fL (ref 80.0–100.0)
NRBC: 0 % (ref 0.0–0.2)
Platelets: 233 10*3/uL (ref 150–400)
RBC: 3.33 MIL/uL — ABNORMAL LOW (ref 3.87–5.11)
RDW: 14.7 % (ref 11.5–15.5)
WBC: 12.7 10*3/uL — ABNORMAL HIGH (ref 4.0–10.5)

## 2018-01-28 MED ORDER — POTASSIUM CHLORIDE CRYS ER 20 MEQ PO TBCR
40.0000 meq | EXTENDED_RELEASE_TABLET | Freq: Once | ORAL | Status: AC
  Administered 2018-01-28: 40 meq via ORAL
  Filled 2018-01-28: qty 2

## 2018-01-28 MED ORDER — MAGNESIUM SULFATE 4 GM/100ML IV SOLN
4.0000 g | Freq: Once | INTRAVENOUS | Status: AC
  Administered 2018-01-28: 4 g via INTRAVENOUS
  Filled 2018-01-28: qty 100

## 2018-01-28 MED ORDER — GUAIFENESIN ER 1200 MG PO TB12: 1.0000 | ORAL_TABLET | Freq: Two times a day (BID) | ORAL | Status: DC

## 2018-01-28 MED ORDER — SODIUM CHLORIDE 0.9 % IV SOLN
1.0000 g | Freq: Two times a day (BID) | INTRAVENOUS | Status: DC
  Administered 2018-01-28 – 2018-01-29 (×2): 1 g via INTRAVENOUS
  Filled 2018-01-28 (×3): qty 1

## 2018-01-28 MED ORDER — LORATADINE 10 MG PO TABS
10.0000 mg | ORAL_TABLET | Freq: Every day | ORAL | Status: DC
  Administered 2018-01-28 – 2018-01-30 (×3): 10 mg via ORAL
  Filled 2018-01-28 (×3): qty 1

## 2018-01-28 MED ORDER — METOCLOPRAMIDE HCL 5 MG/ML IJ SOLN
5.0000 mg | Freq: Four times a day (QID) | INTRAMUSCULAR | Status: DC | PRN
  Administered 2018-01-28: 5 mg via INTRAVENOUS
  Filled 2018-01-28: qty 2

## 2018-01-28 MED ORDER — GUAIFENESIN ER 600 MG PO TB12
1200.0000 mg | ORAL_TABLET | Freq: Two times a day (BID) | ORAL | Status: DC
  Administered 2018-01-28 – 2018-01-30 (×4): 1200 mg via ORAL
  Filled 2018-01-28 (×4): qty 2

## 2018-01-28 MED ORDER — METOCLOPRAMIDE HCL 5 MG/ML IJ SOLN: 10.0000 mg | Freq: Four times a day (QID) | INTRAMUSCULAR | Status: DC | PRN

## 2018-01-28 MED ORDER — METOCLOPRAMIDE HCL 5 MG/ML IJ SOLN
10.0000 mg | Freq: Once | INTRAMUSCULAR | Status: AC
  Administered 2018-01-28: 10 mg via INTRAVENOUS
  Filled 2018-01-28: qty 2

## 2018-01-28 MED ORDER — ALPRAZOLAM 0.25 MG PO TABS
0.2500 mg | ORAL_TABLET | Freq: Once | ORAL | Status: AC
  Administered 2018-01-28: 0.25 mg via ORAL
  Filled 2018-01-28: qty 1

## 2018-01-28 MED ORDER — ALPRAZOLAM 0.25 MG PO TABS
0.2500 mg | ORAL_TABLET | Freq: Three times a day (TID) | ORAL | Status: DC | PRN
  Administered 2018-01-28 – 2018-01-30 (×6): 0.25 mg via ORAL
  Filled 2018-01-28 (×6): qty 1

## 2018-01-28 MED ORDER — ALBUTEROL SULFATE (2.5 MG/3ML) 0.083% IN NEBU
2.5000 mg | INHALATION_SOLUTION | Freq: Three times a day (TID) | RESPIRATORY_TRACT | Status: DC
  Administered 2018-01-28 – 2018-01-30 (×7): 2.5 mg via RESPIRATORY_TRACT
  Filled 2018-01-28 (×8): qty 3

## 2018-01-28 MED ORDER — ALPRAZOLAM 1 MG PO TABS
1.0000 mg | ORAL_TABLET | Freq: Three times a day (TID) | ORAL | Status: DC | PRN
  Filled 2018-01-28: qty 1

## 2018-01-28 MED ORDER — SALINE SPRAY 0.65 % NA SOLN
1.0000 | NASAL | Status: DC | PRN
  Administered 2018-01-28: 1 via NASAL
  Filled 2018-01-28: qty 44

## 2018-01-28 NOTE — Progress Notes (Signed)
LE venous duplex prelim: negative for DVT.  Landry Mellow, RDMS, RVT   Recent negative LE venous studies from 12/14/17 and 11/09/17 can be viewed for comparison.

## 2018-01-28 NOTE — Progress Notes (Signed)
Pharmacy Antibiotic Note  [name] is a 72 y.o. female admitted on 01/27/2018 with pneumonia.  Pharmacy has been consulted for cefepime and levaquin dosing.  WBC WNL, SCr WNL, AF. CXR: New small subtle rounded opacity within the LEFT upper lung. This likely represents a small focus of rounded atelectasis or early developing pneumonia, with progressive metastatic disease considered less likely given the short-term interval since previous chest x-ray.  01/28/18 - SCr increased slightly  - WBC 9.5>12.7, afebrile  Plan: Continue levaquin 750 mg IV q24 Decrease cefepime dosing to 1 g iv q 12 hours F/u renal fxn, WBC, temp, culture data  Height: 5\' 6"  (167.6 cm) Weight: 168 lb 3.4 oz (76.3 kg) IBW/kg (Calculated) : 59.3  Temp (24hrs), Avg:98.8 F (37.1 C), Min:98.2 F (36.8 C), Max:99.2 F (37.3 C)  Recent Labs  Lab 01/23/18 1406 01/27/18 1526 01/28/18 0412  WBC  --  9.5 12.7*  CREATININE 0.98 0.78 0.94    Estimated Creatinine Clearance: 55.6 mL/min (by C-G formula based on SCr of 0.94 mg/dL).    Allergies  Allergen Reactions  . Iohexol Shortness Of Breath and Other (See Comments)    "burns me up inside"    Pt does fine with 13 hour prep  01/17/12  . Other     CAN'T STAND THE SMELL OF PURE BLEACH; "HAVE TO BACK UP AND POUR AND DON'T BREATH IT TIL i GET CAP BACK ON; DILUTE IT TO SPRAY"  . Anoro Ellipta [Umeclidinium-Vilanterol] Swelling    Swelling of lips and mouth  . Codeine Itching and Nausea And Vomiting    REACTION: GI upset  . Protonix [Pantoprazole Sodium] Diarrhea    Antimicrobials this admission: 10/18 lvq>> 10/18 cefepime>> Dose adjustments this admission:  Microbiology results: 10/18 BCx2>>NGTD 10/18 Uxc>>  Thank you for allowing pharmacy to be a part of this patient's care.  Ulice Dash, PharmD Clinical Pharmacist Pager # [phone]  01/28/2018 11:24 AM

## 2018-01-28 NOTE — Progress Notes (Signed)
PROGRESS NOTE    [name]  FMB:[id number] DOB: Apr 06, 1946 DOA: 01/27/2018 PCP: [name], DO    Brief Narrative:  [name] is a 72 y.o. 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  Admission one month ago for dCHF/copd exacerbation.  Felt more or less back to baseline after discharge, but for past couple of weeks has noticed more fatigue than normal, worsened dyspnea on exertion. Also has intermittent cough. Stable 3 l home o2. Awakens at night feeling very short of breath. No worsening orthopnea. Leg swelling is more or less at baseline. Cough is mostly dry, but sometimes productive. No fevers. Was treated with abx last month. No new leg swelling. No blood in cough. No chest pain. No dysuria. No recent med changes.  ED Course: labs, antibiotics, cxr   Assessment & Plan:   Active Problems:   Essential hypertension   COPD (chronic obstructive pulmonary disease) (HCC)   Respiratory failure with hypoxia (HCC)   Primary cancer of right upper lobe of lung (HCC)   Hypokalemia   Esophageal reflux   DM type 2 with diabetic peripheral neuropathy ([place])   Community acquired pneumonia   Pleural effusion   Hypomagnesemia  1 probable community-acquired pneumonia Patient presenting with worsening respiratory symptoms chest x-ray concerning for new opacity in the left upper lobe in the frail individual with multifactorial pulmonary disease of COPD, lung cancer, pleural effusion.  Patient also with a history of CHF.  Patient on examination with no significant wheezing and no use of accessory muscles of respiration.  BNP within normal limits.  Influenza PCR negative.  Urine Legionella and urine pneumococcus antigen pending.  No significant volume overload noted  on examination.  Patient with some clinical improvement.  Lower extremity Dopplers done were negative for DVT.  Patient noted to have an increased risk for Pseudomonas and as such placed empirically on IV cefepime and IV Levaquin.  Continue current dose of prednisone, home dose Lasix, scheduled nebulizers, empiric IV cefepime and IV Levaquin.  Placed on Claritin 10 mg daily and add Mucinex.  Supportive care.  Follow.  2.  Hypertension Continue current regimen of ARB, Lopressor, Lasix.  Follow.  3.  Chronic respiratory failure on home O2 Continue O2.  4.  Chronic pain/anxiety disorder Stable.  Resume home regimen of Xanax.  Continue BuSpar.  Continue current pain regimen.  5.  Well-controlled type 2 diabetes mellitus Hemoglobin A1c 5.8 on August 2019.  Hold oral hypoglycemic agents.  Sliding scale insulin.  6.  Hypomagnesemia/hypokalemia Replete.  Keep magnesium greater than 2.   DVT prophylaxis: Lovenox Code Status: Full Family Communication: Updated patient.  No family at bedside. Disposition Plan: Likely home when clinically improved and at baseline hopefully in the next 24 to 48 hours.   Consultants:   None  Procedures:   Chest x-ray 01/27/2018  Bilateral lower extremity Dopplers 01/28/2018  Antimicrobials:   IV cefepime 01/27/2018  IV Levaquin 01/27/2018   Subjective: Patient sitting up in bed.  Patient states some improvement with her shortness of breath since admission however not at baseline.  Patient complain of some congestion requesting Mucinex.  Objective: Vitals:   01/27/18 2213 01/28/18 0312 01/28/18 0843 01/28/18 0849  BP:  (!) 150/70    Pulse:  94    Resp:  16    Temp:  99.2 F (37.3 C)    TempSrc:  Oral    SpO2: 99% 98% 99% 99%  Weight:      Height:        Intake/Output Summary (Last 24 hours) at 01/28/2018 1433 Last data filed at 01/28/2018 0919 Gross per 24 hour  Intake 600.72 ml  Output -  Net 600.72 ml   Filed Weights   01/27/18  1340 01/27/18 2020  Weight: 75.3 kg 76.3 kg    Examination:  General exam: Appears calm and comfortable  Respiratory system: Some scattered coarse breath sounds.  No wheezing.  No crackles.  Respiratory effort normal. Cardiovascular system: S1 & S2 heard, RRR. No JVD, murmurs, rubs, gallops or clicks. No pedal edema. Gastrointestinal system: Abdomen is soft, nontender, nondistended, positive bowel sounds.  No rebound.  No guarding.  Central nervous system: Alert and oriented. No focal neurological deficits. Extremities: Symmetric 5 x 5 power. Skin: No rashes, lesions or ulcers Psychiatry: Judgement and insight appear normal. Mood & affect appropriate.     Data Reviewed: I have personally reviewed following labs and imaging studies  CBC: Recent Labs  Lab 01/27/18 1526 01/28/18 0412  WBC 9.5 12.7*  NEUTROABS 8.1*  --   HGB 10.2* 9.6*  HCT 33.4* 32.0*  MCV 93.8 96.1  PLT 237 096   Basic Metabolic Panel: Recent Labs  Lab 01/23/18 1406 01/27/18 1526 01/28/18 0412  NA 144 143 141  K 3.6 3.5 3.4*  CL 96 101 102  CO2 28 32 27  GLUCOSE 137* 139* 155*  BUN 12 12 15   CREATININE 0.98 0.78 0.94  CALCIUM 9.0 9.0 8.5*  MG  --  1.3* 1.3*   GFR: Estimated Creatinine Clearance: 55.6 mL/min (by C-G formula based on SCr of 0.94 mg/dL). Liver Function Tests: Recent Labs  Lab 01/27/18 1526  AST 13*  ALT 10  ALKPHOS 80  BILITOT 0.6  PROT 6.6  ALBUMIN 3.6   No results for input(s): LIPASE, AMYLASE in the last 168 hours. No results for input(s): AMMONIA in the last 168 hours. Coagulation Profile: Recent Labs  Lab 01/27/18 1526  INR 0.88   Cardiac Enzymes: No results for input(s): CKTOTAL, CKMB, CKMBINDEX, TROPONINI in the last 168 hours. BNP (last 3 results) Recent Labs    01/10/18 1146 01/23/18 1406  PROBNP 27.0 199   HbA1C: No results for input(s): HGBA1C in the last 72 hours. CBG: Recent Labs  Lab 01/27/18 2314  GLUCAP 169*   Lipid Profile: No results  for input(s): CHOL, HDL, LDLCALC, TRIG, CHOLHDL, LDLDIRECT in the last 72 hours. Thyroid Function Tests: No results for input(s): TSH, T4TOTAL, FREET4, T3FREE, THYROIDAB in the last 72 hours. Anemia Panel: No results for input(s): VITAMINB12, FOLATE, FERRITIN, TIBC, IRON, RETICCTPCT in the last 72 hours. Sepsis Labs: No results for input(s): PROCALCITON, LATICACIDVEN in the last 168 hours.  Recent Results (from the past 240 hour(s))  Blood culture (routine x 2)     Status: None (Preliminary result)   Collection Time: 01/27/18  6:18 PM  Result Value Ref Range Status   Specimen Description   Final    BLOOD LEFT ANTECUBITAL Performed at [facility] [address], [place], [place] [address]    Special Requests   Final    BOTTLES DRAWN AEROBIC AND ANAEROBIC Blood Culture adequate volume Performed at [facility] [address] [place], [place] [address]    Culture   Final    NO GROWTH < 12 HOURS Performed  at [facility], [place] [address] [place], [place] [address]    Report Status PENDING  Incomplete  Blood culture (routine x 2)     Status: None (Preliminary result)   Collection Time: 01/27/18  6:34 PM  Result Value Ref Range Status   Specimen Description   Final    BLOOD RIGHT HAND Performed at [facility] [address], [place], [place] [address]    Special Requests   Final    BOTTLES DRAWN AEROBIC AND ANAEROBIC Blood Culture adequate volume Performed at [facility] [address], [place], [place] [address]    Culture   Final    NO GROWTH < 12 HOURS Performed at [facility] [address] [place], [place] [address]    Report Status PENDING  Incomplete         Radiology Studies: Dg Chest 2 View  Result Date: 01/27/2018 CLINICAL DATA:  Shortness of breath today.  History of lung cancer. EXAM: CHEST - 2 VIEW COMPARISON:  Chest x-rays dated 01/10/2017 and 12/13/2017. Chest  CT dated 11/10/2017. FINDINGS: Slightly increased extent/density of the RIGHT upper lobe mass/consolidation, with associated RIGHT hilar lymphadenopathy. Persistent small RIGHT pleural effusion. New small rounded opacity within the LEFT upper lung, presumably rounded atelectasis or early developing pneumonia. No pneumothorax seen. Heart size and mediastinal contours appear stable. No acute appearing osseous abnormality. IMPRESSION: 1. Slightly increased extent/density of the RIGHT upper lobe mass/consolidation, with associated RIGHT hilar lymphadenopathy, better demonstrated on earlier chest CT of 11/10/2017. 2. Small RIGHT pleural effusion, stable. 3. New small subtle rounded opacity within the LEFT upper lung. This likely represents a small focus of rounded atelectasis or early developing pneumonia, with progressive metastatic disease considered less likely given the short-term interval since previous chest x-ray. Electronically Signed   By: Franki Cabot M.D.   On: 01/27/2018 16:31        Scheduled Meds: . albuterol  2.5 mg Nebulization TID  . aspirin EC  81 mg Oral Daily  . busPIRone  15 mg Oral Daily  . enoxaparin (LOVENOX) injection  40 mg Subcutaneous Q24H  . umeclidinium bromide  1 puff Inhalation Daily   And  . fluticasone furoate-vilanterol  1 puff Inhalation Daily  . furosemide  40 mg Oral Daily  . guaiFENesin  1,200 mg Oral BID  . irbesartan  75 mg Oral Daily  . loratadine  10 mg Oral Daily  . metoprolol tartrate  25 mg Oral BID  . pantoprazole  40 mg Oral Daily  . predniSONE  40 mg Oral Q breakfast  . sodium chloride flush  3 mL Intravenous Q12H   Continuous Infusions: . sodium chloride Stopped (01/27/18 2259)  . ceFEPime (MAXIPIME) IV    . levofloxacin (LEVAQUIN) IV Stopped (01/28/18 0029)  . magnesium sulfate 1 - 4 g bolus IVPB 4 g (01/28/18 1312)     LOS: 1 day    Time spent: 35 minutes    Irine Seal, MD [facility] Pager [phone]  If 7PM-7AM,  please contact night-coverage [email or web address] Password [email or web address] 01/28/2018, 2:33 PM

## 2018-01-29 LAB — BASIC METABOLIC PANEL
ANION GAP: 9 (ref 5–15)
BUN: 14 mg/dL (ref 8–23)
CHLORIDE: 102 mmol/L (ref 98–111)
CO2: 29 mmol/L (ref 22–32)
Calcium: 8.9 mg/dL (ref 8.9–10.3)
Creatinine, Ser: 0.84 mg/dL (ref 0.44–1.00)
GFR calc Af Amer: >60 mL/min (ref >60)
GLUCOSE: 99 mg/dL (ref 70–99)
POTASSIUM: 3.4 mmol/L — AB (ref 3.5–5.1)
Sodium: 140 mmol/L (ref 135–145)

## 2018-01-29 LAB — CBC
HEMATOCRIT: 28.6 % — AB (ref 36.0–46.0)
HEMOGLOBIN: 8.7 g/dL — AB (ref 12.0–15.0)
MCH: 29.1 pg (ref 26.0–34.0)
MCHC: 30.4 g/dL (ref 30.0–36.0)
MCV: 95.7 fL (ref 80.0–100.0)
NRBC: 0.2 % (ref 0.0–0.2)
Platelets: 200 10*3/uL (ref 150–400)
RBC: 2.99 MIL/uL — ABNORMAL LOW (ref 3.87–5.11)
RDW: 14.6 % (ref 11.5–15.5)
WBC: 8.5 10*3/uL (ref 4.0–10.5)

## 2018-01-29 LAB — GLUCOSE, CAPILLARY
GLUCOSE-CAPILLARY: 216 mg/dL — AB (ref 70–99)
Glucose-Capillary: 149 mg/dL — ABNORMAL HIGH (ref 70–99)
Glucose-Capillary: 139 mg/dL — ABNORMAL HIGH (ref 70–99)

## 2018-01-29 LAB — MAGNESIUM: Magnesium: 2.1 mg/dL (ref 1.7–2.4)

## 2018-01-29 MED ORDER — LEVOFLOXACIN 750 MG PO TABS
750.0000 mg | ORAL_TABLET | Freq: Every day | ORAL | Status: DC
Start: 2018-01-29 — End: 2018-01-30
  Administered 2018-01-29 – 2018-01-30 (×2): 750 mg via ORAL
  Filled 2018-01-29 (×2): qty 1

## 2018-01-29 MED ORDER — PANTOPRAZOLE SODIUM 40 MG PO TBEC
40.0000 mg | DELAYED_RELEASE_TABLET | Freq: Two times a day (BID) | ORAL | Status: DC
  Administered 2018-01-29 – 2018-01-30 (×2): 40 mg via ORAL
  Filled 2018-01-29 (×2): qty 1

## 2018-01-29 MED ORDER — POTASSIUM CHLORIDE CRYS ER 20 MEQ PO TBCR
40.0000 meq | EXTENDED_RELEASE_TABLET | Freq: Once | ORAL | Status: AC
  Administered 2018-01-29: 40 meq via ORAL
  Filled 2018-01-29: qty 2

## 2018-01-29 MED ORDER — SODIUM CHLORIDE 0.9 % IV SOLN
1.0000 g | Freq: Three times a day (TID) | INTRAVENOUS | Status: DC
  Administered 2018-01-29: 1 g via INTRAVENOUS
  Filled 2018-01-29 (×2): qty 1

## 2018-01-29 MED ORDER — ALUM & MAG HYDROXIDE-SIMETH 200-200-20 MG/5ML PO SUSP
30.0000 mL | Freq: Once | ORAL | Status: AC
  Administered 2018-01-29: 30 mL via ORAL
  Filled 2018-01-29: qty 30

## 2018-01-29 MED ORDER — PROCHLORPERAZINE EDISYLATE 10 MG/2ML IJ SOLN: 10.0000 mg | Freq: Four times a day (QID) | INTRAMUSCULAR | Status: DC | PRN

## 2018-01-29 MED ORDER — GI COCKTAIL ~~LOC~~
30.0000 mL | Freq: Three times a day (TID) | ORAL | Status: DC | PRN
  Filled 2018-01-29: qty 30

## 2018-01-29 MED ORDER — PREDNISONE 20 MG PO TABS
20.0000 mg | ORAL_TABLET | Freq: Every day | ORAL | Status: DC
  Administered 2018-01-29 – 2018-01-30 (×2): 20 mg via ORAL
  Filled 2018-01-29: qty 1

## 2018-01-29 MED ORDER — SIMETHICONE 80 MG PO CHEW
80.0000 mg | CHEWABLE_TABLET | Freq: Four times a day (QID) | ORAL | Status: DC
  Administered 2018-01-29 – 2018-01-30 (×4): 80 mg via ORAL
  Filled 2018-01-29 (×4): qty 1

## 2018-01-29 NOTE — Progress Notes (Signed)
PROGRESS NOTE    [name]  VEL:[id number] DOB: 1945-08-27 DOA: 01/27/2018 PCP: [name], DO    Brief Narrative:  [name] is a 72 y.o. 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  Admission one month ago for dCHF/copd exacerbation.  Felt more or less back to baseline after discharge, but for past couple of weeks has noticed more fatigue than normal, worsened dyspnea on exertion. Also has intermittent cough. Stable 3 l home o2. Awakens at night feeling very short of breath. No worsening orthopnea. Leg swelling is more or less at baseline. Cough is mostly dry, but sometimes productive. No fevers. Was treated with abx last month. No new leg swelling. No blood in cough. No chest pain. No dysuria. No recent med changes.  ED Course: labs, antibiotics, cxr   Assessment & Plan:   Active Problems:   Essential hypertension   COPD (chronic obstructive pulmonary disease) (HCC)   Respiratory failure with hypoxia (HCC)   Primary cancer of right upper lobe of lung (HCC)   Hypokalemia   Esophageal reflux   DM type 2 with diabetic peripheral neuropathy ([place])   Community acquired pneumonia   Pleural effusion   Hypomagnesemia  1 probable community-acquired pneumonia Patient presenting with worsening respiratory symptoms chest x-ray concerning for new opacity in the left upper lobe in the frail individual with multifactorial pulmonary disease of COPD, lung cancer, pleural effusion.  Patient also with a history of CHF.  Patient on examination with no significant wheezing and no use of accessory muscles of respiration.  BNP within normal limits.  Influenza PCR negative.  Urine Legionella and urine pneumococcus antigen pending.  No significant volume overload noted  on examination.  Patient with some clinical improvement.  Lower extremity Dopplers done were negative for DVT.  Patient noted to have an increased risk for Pseudomonas and as such placed empirically on IV cefepime and IV Levaquin.  Decrease prednisone to 20 mg daily and taper back to home dose.  Continue home dose Lasix, scheduled nebs.  Discontinue IV cefepime.  Transition to oral Levaquin.  Continue Claritin, Mucinex.  Supportive care.  Will need outpatient follow-up with pulmonary.   2.  Hypertension Continue current regimen of ARB, Lopressor, Lasix.  Follow.  3.  Chronic respiratory failure on home O2 Continue O2.  4.  Chronic pain/anxiety disorder Stable.  Patient states was not taking BuSpar at home and hesitant to take it here.  Continue home regimen of Xanax.  Continue current pain regimen.   5.  Well-controlled type 2 diabetes mellitus Hemoglobin A1c 5.8 on August 2019.  CBG of 139 today.  Continue to hold oral hypoglycemic agents.  Continue sliding scale insulin.   6.  Hypomagnesemia/hypokalemia Replete.  Keep magnesium greater than 2.   DVT prophylaxis: Lovenox Code Status: Full Family Communication: Updated patient.  No family at bedside. Disposition Plan: Likely home when clinically improved and at baseline hopefully in the next 24 to 48 hours.   Consultants:   None  Procedures:   Chest x-ray 01/27/2018  Bilateral lower extremity Dopplers 01/28/2018  Antimicrobials:   IV cefepime 01/27/2018>>>>>> 01/29/2018  IV Levaquin 01/27/2018>>>> oral Levaquin 01/29/2018   Subjective: Patient sitting up in bed.  Patient with multiple complaints today including nausea, abdominal discomfort, panic attacks.  Patient states she has a history of gallstones.  Shortness of breath improving.  No chest pain.   Objective: Vitals:   01/28/18 1945 01/28/18 2024 01/29/18 0437 01/29/18 0836  BP:  134/74 (!) 159/54   Pulse:  74 96   Resp:  16 14   Temp:  97.9 F (36.6 C) 98.2 F  (36.8 C)   TempSrc:  Oral Oral   SpO2: 98% 99% 99% 97%  Weight:      Height:        Intake/Output Summary (Last 24 hours) at 01/29/2018 1220 Last data filed at 01/29/2018 0942 Gross per 24 hour  Intake 360 ml  Output 500 ml  Net -140 ml   Filed Weights   01/27/18 1340 01/27/18 2020  Weight: 75.3 kg 76.3 kg    Examination:  General exam: Appears calm and comfortable  Respiratory system: Scattered coarse breath sounds.  No wheezing noted.  Normal respiratory effort.  Cardiovascular system: Regular rate rhythm no murmurs rubs or gallops.  No JVD.  No lower extremity edema.  Gastrointestinal system: Abdomen is nontender, nondistended, soft, positive bowel sounds.  No rebound.  No guarding.  Central nervous system: Alert and oriented. No focal neurological deficits. Extremities: Symmetric 5 x 5 power. Skin: No rashes, lesions or ulcers Psychiatry: Judgement and insight appear normal. Mood & affect appropriate.     Data Reviewed: I have personally reviewed following labs and imaging studies  CBC: Recent Labs  Lab 01/27/18 1526 01/28/18 0412 01/29/18 0512  WBC 9.5 12.7* 8.5  NEUTROABS 8.1*  --   --   HGB 10.2* 9.6* 8.7*  HCT 33.4* 32.0* 28.6*  MCV 93.8 96.1 95.7  PLT 237 233 831   Basic Metabolic Panel: Recent Labs  Lab 01/23/18 1406 01/27/18 1526 01/28/18 0412 01/29/18 0512  NA 144 143 141 140  K 3.6 3.5 3.4* 3.4*  CL 96 101 102 102  CO2 28 32 27 29  GLUCOSE 137* 139* 155* 99  BUN 12 12 15 14   CREATININE 0.98 0.78 0.94 0.84  CALCIUM 9.0 9.0 8.5* 8.9  MG  --  1.3* 1.3* 2.1   GFR: Estimated Creatinine Clearance: 62.2 mL/min (by C-G formula based on SCr of 0.84 mg/dL). Liver Function Tests: Recent Labs  Lab 01/27/18 1526  AST 13*  ALT 10  ALKPHOS 80  BILITOT 0.6  PROT 6.6  ALBUMIN 3.6   No results for input(s): LIPASE, AMYLASE in the last 168 hours. No results for input(s): AMMONIA in the last 168 hours. Coagulation Profile: Recent Labs  Lab  01/27/18 1526  INR 0.88   Cardiac Enzymes: No results for input(s): CKTOTAL, CKMB, CKMBINDEX, TROPONINI in the last 168 hours. BNP (last 3 results) Recent Labs    01/10/18 1146 01/23/18 1406  PROBNP 27.0 199   HbA1C: No results for input(s): HGBA1C in the last 72 hours. CBG: Recent Labs  Lab 01/27/18 2314 01/28/18 2027 01/29/18 1212  GLUCAP 169* 149* 139*   Lipid Profile: No results for input(s): CHOL, HDL, LDLCALC, TRIG, CHOLHDL, LDLDIRECT in the last 72 hours. Thyroid Function Tests: No results for input(s): TSH, T4TOTAL, FREET4, T3FREE, THYROIDAB in the last 72 hours. Anemia Panel: No results for input(s): VITAMINB12, FOLATE, FERRITIN, TIBC, IRON, RETICCTPCT in the last 72 hours. Sepsis Labs: No results for input(s): PROCALCITON, LATICACIDVEN in the last 168 hours.  Recent Results (from the past 240 hour(s))  Blood culture (routine x 2)     Status: None (Preliminary result)   Collection Time: 01/27/18  6:18 PM  Result Value Ref Range Status  Specimen Description   Final    BLOOD LEFT ANTECUBITAL Performed at [facility] [address], [place], [place] [address]    Special Requests   Final    BOTTLES DRAWN AEROBIC AND ANAEROBIC Blood Culture adequate volume Performed at [facility] [address] [place], [place] [address]    Culture   Final    NO GROWTH < 24 HOURS Performed at [facility] [address] [place], [place] [address]    Report Status PENDING  Incomplete  Blood culture (routine x 2)     Status: None (Preliminary result)   Collection Time: 01/27/18  6:34 PM  Result Value Ref Range Status   Specimen Description   Final    BLOOD RIGHT HAND Performed at [facility] [address] [place], [place] [address]    Special Requests   Final    BOTTLES DRAWN AEROBIC AND ANAEROBIC Blood Culture adequate volume Performed at [facility] [address],  [place], [place] [address]    Culture   Final    NO GROWTH < 24 HOURS Performed at [facility] [address], [place], [place] [address]    Report Status PENDING  Incomplete         Radiology Studies: Dg Chest 2 View  Result Date: 01/27/2018 CLINICAL DATA:  Shortness of breath today.  History of lung cancer. EXAM: CHEST - 2 VIEW COMPARISON:  Chest x-rays dated 01/10/2017 and 12/13/2017. Chest CT dated 11/10/2017. FINDINGS: Slightly increased extent/density of the RIGHT upper lobe mass/consolidation, with associated RIGHT hilar lymphadenopathy. Persistent small RIGHT pleural effusion. New small rounded opacity within the LEFT upper lung, presumably rounded atelectasis or early developing pneumonia. No pneumothorax seen. Heart size and mediastinal contours appear stable. No acute appearing osseous abnormality. IMPRESSION: 1. Slightly increased extent/density of the RIGHT upper lobe mass/consolidation, with associated RIGHT hilar lymphadenopathy, better demonstrated on earlier chest CT of 11/10/2017. 2. Small RIGHT pleural effusion, stable. 3. New small subtle rounded opacity within the LEFT upper lung. This likely represents a small focus of rounded atelectasis or early developing pneumonia, with progressive metastatic disease considered less likely given the short-term interval since previous chest x-ray. Electronically Signed   By: Franki Cabot M.D.   On: 01/27/2018 16:31        Scheduled Meds: . albuterol  2.5 mg Nebulization TID  . aspirin EC  81 mg Oral Daily  . busPIRone  15 mg Oral Daily  . enoxaparin (LOVENOX) injection  40 mg Subcutaneous Q24H  . umeclidinium bromide  1 puff Inhalation Daily   And  . fluticasone furoate-vilanterol  1 puff Inhalation Daily  . furosemide  40 mg Oral Daily  . guaiFENesin  1,200 mg Oral BID  . irbesartan  75 mg Oral Daily  . levofloxacin  750 mg Oral Daily  . loratadine  10 mg Oral Daily  . metoprolol tartrate  25 mg Oral BID  .  pantoprazole  40 mg Oral Daily  . predniSONE  40 mg Oral Q breakfast  . sodium chloride flush  3 mL Intravenous Q12H   Continuous Infusions: . sodium chloride Stopped (01/27/18 2259)  . ceFEPime (MAXIPIME) IV 1 g (01/29/18 1022)     LOS: 2 days    Time spent: 35 minutes    Irine Seal, MD [facility] Pager [phone]  If 7PM-7AM, please contact night-coverage [email or web address] Password [email or web address] 01/29/2018, 12:20 PM

## 2018-01-29 NOTE — Progress Notes (Signed)
Pharmacy Antibiotic Note  [name] is a 72 y.o. female admitted on 01/27/2018 with pneumonia.  Pharmacy has been consulted for cefepime and levaquin dosing.  WBC WNL, SCr WNL, AF. CXR: New small subtle rounded opacity within the LEFT upper lung. This likely represents a small focus of rounded atelectasis or early developing pneumonia, with progressive metastatic disease considered less likely given the short-term interval since previous chest x-ray.  01/29/18 - SCr relatively stable - WBC 9.5>12.7>8.5, afebrile  Plan: Convert Levaquin to 750 mg po daily.  Increase cefepime dosing to 1 g iv q8h F/u renal fxn, WBC, temp, culture data  Height: 5\' 6"  (167.6 cm) Weight: 168 lb 3.4 oz (76.3 kg) IBW/kg (Calculated) : 59.3  Temp (24hrs), Avg:98.2 F (36.8 C), Min:97.9 F (36.6 C), Max:98.4 F (36.9 C)  Recent Labs  Lab 01/23/18 1406 01/27/18 1526 01/28/18 0412 01/29/18 0512  WBC  --  9.5 12.7* 8.5  CREATININE 0.98 0.78 0.94 0.84    Estimated Creatinine Clearance: 62.2 mL/min (by C-G formula based on SCr of 0.84 mg/dL).    Allergies  Allergen Reactions  . Iohexol Shortness Of Breath and Other (See Comments)    "burns me up inside"    Pt does fine with 13 hour prep  01/17/12  . Other     CAN'T STAND THE SMELL OF PURE BLEACH; "HAVE TO BACK UP AND POUR AND DON'T BREATH IT TIL i GET CAP BACK ON; DILUTE IT TO SPRAY"  . Anoro Ellipta [Umeclidinium-Vilanterol] Swelling    Swelling of lips and mouth  . Codeine Itching and Nausea And Vomiting    REACTION: GI upset  . Protonix [Pantoprazole Sodium] Diarrhea    Antimicrobials this admission: 10/18 lvq>> 10/18 cefepime>>  Dose adjustments this admission: 10/19- decrease cefepime 1g q12h 10/20- increase cefepime 1g q 8h  Microbiology results: 10/18 BCx2>>NGTD 10/18 Uxc>>  Thank you for allowing pharmacy to be a part of this patient's care.  Ulice Dash, PharmD Clinical Pharmacist Pager # [phone]  01/29/2018 9:52  AM

## 2018-01-29 NOTE — Evaluation (Addendum)
Physical Therapy Evaluation Patient Details Name: [name] MRN: [id number] DOB: 04-Nov-1945 Today's Date: 01/29/2018   History of Present Illness  72 yo female admitted with Pna. Hx of NSCLC, COPD-O2 dep, CHF, DM, neuropathy, CAD, A fib, fibromyalgia  Clinical Impression  On eval, pt was Min guard assist for mobility. She walked ~15' x 1, then another 30'x1 while holding on to the IV pole for support. She c/o lightheadedness and requested to return to her room. Pt had multiple complaints during session and she was not very motivated to walk in hallway on today. Will continue to follow and progress activity as tolerated. Recommend daily ambulation in hallway with nursing assistance in addition to PT sessions.     Follow Up Recommendations Home health PT;Supervision - Intermittent    Equipment Recommendations  None recommended by PT    Recommendations for Other Services       Precautions / Restrictions Precautions Precautions: Fall Precaution Comments: monitor O2-O2 dep Restrictions Weight Bearing Restrictions: No      Mobility  Bed Mobility   Bed Mobility: Supine to Sit;Sit to Supine     Supine to sit: Supervision Sit to supine: Supervision   General bed mobility comments: for lines.   Transfers Overall transfer level: Needs assistance Equipment used: None Transfers: Sit to/from Stand Sit to Stand: Min guard         General transfer comment: close guard for safety.   Ambulation/Gait Ambulation/Gait assistance: Min guard Gait Distance (Feet): 30 Feet(15'x1) Assistive device: IV Pole Gait Pattern/deviations: Step-through pattern;Decreased stride length     General Gait Details: Close guard for safety. Pt held on to IV pole with 2 hands. She c/o lightheadedness while ambulating and requested to return to the room. O2 sat >90% on [place] O2.   Stairs            Wheelchair Mobility    Modified Rankin (Stroke Patients Only)       Balance Overall  balance assessment: Needs assistance           Standing balance-[name] Scale: Fair                               Pertinent Vitals/Pain Pain Assessment: 0-10 Pain Score: 5  Pain Location: neck Pain Descriptors / Indicators: Aching;Sore Pain Intervention(s): Monitored during session;Repositioned    Home Living Family/patient expects to be discharged to:: Private residence Living Arrangements: Children Available Help at Discharge: Family;Available PRN/intermittently Type of Home: House Home Access: Stairs to enter Entrance Stairs-Rails: Right;Left;Can reach both Entrance Stairs-Number of Steps: 10 front, 8 back Home Layout: One level Home Equipment: Cane - single point;Shower seat;Walker - 2 wheels;Walker - 4 wheels;Grab bars - tub/shower;Transport chair Additional Comments: 4L home O2    Prior Function Level of Independence: Independent with assistive device(s)         Comments: Mod indep with SPC. Limited to short household distances due to SOB/fatigue. Daughter drives. Pt reports only leaves house for MD appointments. Sits in recliner majority of day     Hand Dominance        Extremity/Trunk Assessment   Upper Extremity Assessment Upper Extremity Assessment: Generalized weakness    Lower Extremity Assessment Lower Extremity Assessment: Generalized weakness    Cervical / Trunk Assessment Cervical / Trunk Assessment: Kyphotic  Communication      Cognition Arousal/Alertness: Awake/alert Behavior During Therapy: WFL for tasks assessed/performed Overall Cognitive Status: Within Functional Limits for tasks  assessed                                        General Comments      Exercises     Assessment/Plan    PT Assessment Patient needs continued PT services  PT Problem List Decreased balance;Decreased mobility;Decreased activity tolerance       PT Treatment Interventions DME instruction;Gait training;Functional mobility  training;Therapeutic activities;Balance training;Patient/family education;Therapeutic exercise    PT Goals (Current goals can be found in the Care Plan section)  Acute Rehab PT Goals Patient Stated Goal: none stated PT Goal Formulation: With patient Time For Goal Achievement: 02/12/18 Potential to Achieve Goals: Good    Frequency Min 3X/week   Barriers to discharge        Co-evaluation               AM-PAC PT "6 Clicks" Daily Activity  Outcome Measure Difficulty turning over in bed (including adjusting bedclothes, sheets and blankets)?: None Difficulty moving from lying on back to sitting on the side of the bed? : None Difficulty sitting down on and standing up from a chair with arms (e.g., wheelchair, bedside commode, etc,.)?: A Little Help needed moving to and from a bed to chair (including a wheelchair)?: A Little Help needed walking in hospital room?: A Little Help needed climbing 3-5 steps with a railing? : A Little 6 Click Score: 20    End of Session Equipment Utilized During Treatment: Oxygen Activity Tolerance: Patient limited by fatigue Patient left: in bed;with call bell/phone within reach   PT Visit Diagnosis: Unsteadiness on feet (R26.81);Difficulty in walking, not elsewhere classified (R26.2)    Time: 1610-9604 PT Time Calculation (min) (ACUTE ONLY): 19 min   Charges:   PT Evaluation $PT Eval Moderate Complexity: [place], PT Acute Rehabilitation Services Pager: [phone] Office: [phone]

## 2018-01-30 ENCOUNTER — Telehealth: Payer: Self-pay | Admitting: Emergency Medicine

## 2018-01-30 DIAGNOSIS — J189 Pneumonia, unspecified organism: Secondary | ICD-10-CM

## 2018-01-30 LAB — BASIC METABOLIC PANEL
Anion gap: 9 (ref 5–15)
BUN: 18 mg/dL (ref 8–23)
CHLORIDE: 103 mmol/L (ref 98–111)
CO2: 28 mmol/L (ref 22–32)
Calcium: 9.1 mg/dL (ref 8.9–10.3)
Creatinine, Ser: 0.97 mg/dL (ref 0.44–1.00)
GFR calc Af Amer: >60 mL/min (ref >60)
GFR calc non Af Amer: 57 mL/min — ABNORMAL LOW (ref >60)
GLUCOSE: 101 mg/dL — AB (ref 70–99)
Potassium: 3.7 mmol/L (ref 3.5–5.1)
Sodium: 140 mmol/L (ref 135–145)

## 2018-01-30 LAB — GLUCOSE, CAPILLARY
GLUCOSE-CAPILLARY: 123 mg/dL — AB (ref 70–99)
Glucose-Capillary: 80 mg/dL (ref 70–99)

## 2018-01-30 MED ORDER — SIMETHICONE 80 MG PO CHEW: 80.0000 mg | CHEWABLE_TABLET | Freq: Four times a day (QID) | ORAL | 0 refills | Status: DC | PRN

## 2018-01-30 MED ORDER — LORATADINE 10 MG PO TABS: 10.0000 mg | ORAL_TABLET | Freq: Every day | ORAL | 0 refills | Status: AC

## 2018-01-30 MED ORDER — PANTOPRAZOLE SODIUM 40 MG PO TBEC: 40.0000 mg | DELAYED_RELEASE_TABLET | Freq: Two times a day (BID) | ORAL | 0 refills | Status: AC

## 2018-01-30 MED ORDER — LEVOFLOXACIN 750 MG PO TABS: 750.0000 mg | ORAL_TABLET | Freq: Every day | ORAL | 0 refills | Status: AC

## 2018-01-30 MED ORDER — ALPRAZOLAM 0.5 MG PO TABS: ORAL_TABLET | ORAL | 0 refills | Status: AC

## 2018-01-30 MED ORDER — SALINE SPRAY 0.65 % NA SOLN: 1.0000 | NASAL | 0 refills | Status: DC | PRN

## 2018-01-30 NOTE — Discharge Summary (Signed)
Physician Discharge Summary  [name] JJO:[id number] DOB: [birth year] DOA: 01/27/2018  PCP: [name], DO  Admit date: 01/27/2018 Discharge date: 01/30/2018  Time spent: 60 minutes  Recommendations for Outpatient Follow-up:  1. Follow-up with [name], DO in 2 weeks.  On follow-up patient's anxiety will need to be reassessed.  Patient will need a basic metabolic profile done to follow-up on electrolytes and renal function. 2. Follow-up with [name] pulmonary as previously scheduled. 3. Follow-up with Dr. Lorna Few, oncology as previously scheduled.   Discharge Diagnoses:  Principal Problem:   Community acquired pneumonia Active Problems:   Essential hypertension   COPD (chronic obstructive pulmonary disease) (HCC)   Respiratory failure with hypoxia ([place])   Primary cancer of right upper lobe of lung (HCC)   Hypokalemia   Esophageal reflux   DM type 2 with diabetic peripheral neuropathy (HCC)   Pleural effusion   Hypomagnesemia   Discharge Condition: Stable and improved  Diet recommendation: Heart healthy  Filed Weights   01/27/18 1340 01/27/18 2020  Weight: 75.3 kg 76.3 kg    History of present illness:  Per Dr. Remo Lipps is a 72 y.o. 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    Admission one month ago for dCHF/copd exacerbation.  Felt more or less back to baseline after discharge, but for past couple of weeks has noticed more fatigue than normal, worsened dyspnea on exertion. Also has intermittent cough. Stable 3 l home o2. Awakens at night feeling very short of breath. No worsening orthopnea. Leg swelling is more or less at baseline. Cough is mostly dry, but sometimes productive. No fevers. Was  treated with abx last month. No new leg swelling. No blood in cough. No chest pain. No dysuria. No recent med changes.  ED Course: labs, antibiotics, cxr   Hospital Course:  1 probable community-acquired pneumonia Patient presented with worsening respiratory symptoms chest x-ray concerning for new opacity in the left upper lobe in the frail individual with multifactorial pulmonary disease of COPD, lung cancer, pleural effusion.  Patient also with a history of CHF.  Patient on examination with no significant wheezing and no use of accessory muscles of respiration.  BNP within normal limits.  Influenza PCR negative.  Urine Legionella and urine pneumococcus antigen pending.  No significant volume overload noted on examination.  Patient with some clinical improvement.  Lower extremity Dopplers done were negative for DVT.  Patient noted to have an increased risk for Pseudomonas and as such placed empirically on IV cefepime and IV Levaquin.    Patient was also placed on prednisone 40 mg daily and does taper during the hospitalization.  Patient also maintained on home regimen Lasix and scheduled nebs.  IV cefepime was subsequently discontinued and patient was transitioned from IV Levaquin to oral Levaquin.  Patient also placed on home regimen of Mucinex and started on Claritin.  Patient improved clinically.  Patient be discharged home in stable and improved condition and is to follow-up with her pulmonologist as previously scheduled as well as PCP.  2.  Hypertension Patient maintained on home regimen of ARB, Lopressor, Lasix.   3.  Chronic respiratory failure on home O2 Continue on home O2.  4.  Chronic pain/anxiety disorder Stable.  Patient states was not taking BuSpar at home and hesitant to  take it here.    Patient was placed back on home regimen of Xanax 0.25 mg 3 times daily which was decreased from her prescribed dose of 1 mg 3 times daily.  Patient with some improvement with anxiety. Patient will  be discharged on Xanax 0.5 mg 3 times daily as needed.  Patient was given a prescription with 10 tablets until she is able to follow-up with PCP.  Patient was maintained on home regimen of pain management.  Outpatient follow-up.    5.  Well-controlled type 2 diabetes mellitus Hemoglobin A1c 5.8 on August 2019.  CBG of 123 today.    Oral hypoglycemic agents were held during the hospitalization and patient maintained on sliding scale insulin.  Oral hypoglycemic agents will be resumed on discharge.    6.  Hypomagnesemia/hypokalemia Repleted.   Procedures:  Chest x-ray 01/27/2018  Bilateral lower extremity Dopplers 01/28/2018   Consultations:  None  Discharge Exam: Vitals:   01/30/18 0616 01/30/18 0836  BP: (!) 160/113   Pulse: (!) 123   Resp: 16   Temp: 98.5 F (36.9 C)   SpO2: 90% 98%    General: NAD Cardiovascular: RRR Respiratory: CTAB  Discharge Instructions   Discharge Instructions    Diet - low sodium heart healthy   Complete by:  As directed    Increase activity slowly   Complete by:  As directed      Allergies as of 01/30/2018      Reactions   Iohexol Shortness Of Breath, Other (See Comments)   "burns me up inside"    Pt does fine with 13 hour prep  01/17/12   Other    CAN'T STAND THE SMELL OF PURE BLEACH; "HAVE TO BACK UP AND POUR AND DON'T BREATH IT TIL i GET CAP BACK ON; DILUTE IT TO SPRAY"   Anoro Ellipta [umeclidinium-vilanterol] Swelling   Swelling of lips and mouth   Codeine Itching, Nausea And Vomiting   REACTION: GI upset   Protonix [pantoprazole Sodium] Diarrhea      Medication List    STOP taking these medications   busPIRone 15 MG tablet Commonly known as:  BUSPAR   omeprazole 20 MG capsule Commonly known as:  PRILOSEC Replaced by:  pantoprazole 40 MG tablet     TAKE these medications   ACCU-CHEK AVIVA PLUS test strip Generic drug:  glucose blood USE AS DIRECTED TO CHECK BLOOD SUGAR THREE TIMES DAILY   ACCU-CHEK AVIVA PLUS  w/Device Kit Use as directed to check blood sugar Dx: E11.9, E11.49   albuterol 108 (90 Base) MCG/ACT inhaler Commonly known as:  PROVENTIL HFA;VENTOLIN HFA Inhale 2 puffs into the lungs every 6 (six) hours as needed for wheezing or shortness of breath. What changed:  Another medication with the same name was changed. Make sure you understand how and when to take each.   albuterol (2.5 MG/3ML) 0.083% nebulizer solution Commonly known as:  PROVENTIL USE 1 VIAL VIA NEBULIZER EVERY 6 HOURS AS NEEDED FOR WHEEZING OR SHORTNESS OF BREATH What changed:  See the new instructions.   ALPRAZolam 0.5 MG tablet Commonly known as:  XANAX TAKE 1 TABLET BY MOUTH 3 TIMES A DAY AS NEEDED FOR ANXIETY What changed:  medication strength   aspirin 81 MG tablet Take 81 mg by mouth daily.   CALTRATE PLUS PO Take 1 tablet by mouth every other day.   FLUTTER Devi Use a directed   furosemide 40 MG tablet Commonly known as:  LASIX Take 1 tablet (40 mg  total) by mouth daily.   Guaifenesin 1200 MG Tb12 Take 1 tablet (1,200 mg total) by mouth 2 (two) times daily.   HYDROcodone-acetaminophen 10-325 MG tablet Commonly known as:  NORCO Take 1 tablet by mouth 2 (two) times daily as needed. What changed:    when to take this  reasons to take this   levofloxacin 750 MG tablet Commonly known as:  LEVAQUIN Take 1 tablet (750 mg total) by mouth daily for 4 days. Start taking on:  01/31/2018   loratadine 10 MG tablet Commonly known as:  CLARITIN Take 1 tablet (10 mg total) by mouth daily. Start taking on:  01/31/2018   metFORMIN 500 MG tablet Commonly known as:  GLUCOPHAGE Take 1 tablet (500 mg total) by mouth 2 (two) times daily.   metoprolol tartrate 25 MG tablet Commonly known as:  LOPRESSOR Take 1 tablet (25 mg total) by mouth 2 (two) times daily.   nitroGLYCERIN 0.4 MG SL tablet Commonly known as:  NITROSTAT Place 1 tablet (0.4 mg total) under the tongue every 5 (five) minutes as  needed. What changed:  reasons to take this   ondansetron 4 MG tablet Commonly known as:  ZOFRAN Take 1 tablet (4 mg total) by mouth every 8 (eight) hours as needed for nausea or vomiting.   OXYGEN Inhale 4 L into the lungs.   pantoprazole 40 MG tablet Commonly known as:  PROTONIX Take 1 tablet (40 mg total) by mouth 2 (two) times daily before a meal. Replaces:  omeprazole 20 MG capsule   potassium chloride 10 MEQ tablet Commonly known as:  K-DUR,KLOR-CON Take 1 tablet (10 mEq total) by mouth daily.   predniSONE 10 MG tablet Commonly known as:  DELTASONE Take 1 tablet (10 mg total) by mouth daily with breakfast.   senna 8.6 MG tablet Commonly known as:  SENOKOT Take 1 tablet by mouth 2 (two) times daily as needed for constipation.   simethicone 80 MG chewable tablet Commonly known as:  MYLICON Chew 1 tablet (80 mg total) by mouth 4 (four) times daily as needed for flatulence.   sodium chloride 0.65 % Soln nasal spray Commonly known as:  OCEAN Place 1 spray into both nostrils as needed for congestion.   traMADol 50 MG tablet Commonly known as:  ULTRAM TAKE 2 TABLETS (100MG TOTAL) BY MOUTH EVERY 6 HOURS AS NEEDED FOR SEVERE PAIN What changed:  See the new instructions.   TRELEGY ELLIPTA 100-62.5-25 MCG/INH Aepb Generic drug:  Fluticasone-Umeclidin-Vilant INHALE 1 PUFF BY MOUTH ONCE DAILY What changed:  See the new instructions.   valsartan 40 MG tablet Commonly known as:  DIOVAN Take 1 tablet (40 mg total) by mouth daily.      Allergies  Allergen Reactions  . Iohexol Shortness Of Breath and Other (See Comments)    "burns me up inside"    Pt does fine with 13 hour prep  01/17/12  . Other     CAN'T STAND THE SMELL OF PURE BLEACH; "HAVE TO BACK UP AND POUR AND DON'T BREATH IT TIL i GET CAP BACK ON; DILUTE IT TO SPRAY"  . Anoro Ellipta [Umeclidinium-Vilanterol] Swelling    Swelling of lips and mouth  . Codeine Itching and Nausea And Vomiting    REACTION: GI upset   . Protonix [Pantoprazole Sodium] Diarrhea   Follow-up Information    [name], DO. Schedule an appointment as soon as possible for a visit in 2 week(s).   Specialty:  Family Medicine Contact information: [address] [place] [address] [phone]        Sherren Mocha, MD .   Specialty:  Cardiology Contact information: [phone] [address] [place] [address] [phone]        Collene Gobble, MD Follow up.   Specialty:  Pulmonary Disease Why:  F/U AS SCHEDULED Contact information: [address] [place] [address] [phone]        Curt Bears, MD Follow up.   Specialty:  Oncology Why:  F/U AS SCHEDULED. Contact information: [place] [address] [phone]            The results of significant diagnostics from this hospitalization (including imaging, microbiology, ancillary and laboratory) are listed below for reference.    Significant Diagnostic Studies: Dg Chest 2 View  Result Date: 01/27/2018 CLINICAL DATA:  Shortness of breath today.  History of lung cancer. EXAM: CHEST - 2 VIEW COMPARISON:  Chest x-rays dated 01/10/2017 and 12/13/2017. Chest CT dated 11/10/2017. FINDINGS: Slightly increased extent/density of the RIGHT upper lobe mass/consolidation, with associated RIGHT hilar lymphadenopathy. Persistent small RIGHT pleural effusion. New small rounded opacity within the LEFT upper lung, presumably rounded atelectasis or early developing pneumonia. No pneumothorax seen. Heart size and mediastinal contours appear stable. No acute appearing osseous abnormality. IMPRESSION: 1. Slightly increased extent/density of the RIGHT upper lobe mass/consolidation, with associated RIGHT hilar lymphadenopathy, better demonstrated on earlier chest CT of 11/10/2017. 2. Small RIGHT pleural effusion, stable. 3. New small subtle rounded opacity within the LEFT upper lung. This likely represents a  small focus of rounded atelectasis or early developing pneumonia, with progressive metastatic disease considered less likely given the short-term interval since previous chest x-ray. Electronically Signed   By: Franki Cabot M.D.   On: 01/27/2018 16:31   Dg Chest 2 View  Result Date: 01/10/2018 CLINICAL DATA:  Shortness of breath.  Fever.  Edema. EXAM: CHEST - 2 VIEW COMPARISON:  12/13/2017. FINDINGS: Surgical clips and sutures noted over the chest. Heart size normal. Persistent right upper lung mass/consolidation right upper lung without significant change. Associated right hilar adenopathy may be present. Small right pleural effusion again noted. Mild left upper lobe subsegmental atelectasis. No pneumothorax. IMPRESSION: 1. Right upper lung mass/consolidation without significant change. Associated right hilar adenopathy may be present. Associated small right pleural effusion again noted. 2.  Mild left upper lobe subsegmental atelectasis. Electronically Signed   By: Marcello Moores  Register   On: 01/10/2018 12:35    Microbiology: Recent Results (from the past 240 hour(s))  Blood culture (routine x 2)     Status: None (Preliminary result)   Collection Time: 01/27/18  6:18 PM  Result Value Ref Range Status   Specimen Description   Final    BLOOD LEFT ANTECUBITAL Performed at [facility] [address] [place], [place] [address]    Special Requests   Final    BOTTLES DRAWN AEROBIC AND ANAEROBIC Blood Culture adequate volume Performed at [facility] [address], [place], [place] [address]    Culture   Final    NO GROWTH 3 DAYS Performed at [facility], [place] [address], [place], [place] [address]    Report Status PENDING  Incomplete  Blood culture (routine x 2)     Status: None (Preliminary result)   Collection Time: 01/27/18  6:34 PM  Result Value Ref Range Status   Specimen Description   Final    BLOOD RIGHT HAND Performed  at [facility], [place] [address], [place], [place] [address]    Special Requests   Final    BOTTLES DRAWN AEROBIC AND ANAEROBIC Blood Culture adequate volume Performed at [facility] [address], [place], [place] [address]    Culture   Final    NO GROWTH 3 DAYS Performed at [facility], [place] [address], [place], [place] [address]    Report Status PENDING  Incomplete     Labs: Basic Metabolic Panel: Recent Labs  Lab 01/23/18 1406 01/27/18 1526 01/28/18 0412 01/29/18 0512 01/30/18 0527  NA 144 143 141 140 140  K 3.6 3.5 3.4* 3.4* 3.7  CL 96 101 102 102 103  CO2 28 32 27 29 28   GLUCOSE 137* 139* 155* 99 101*  BUN 12 12 15 14 18   CREATININE 0.98 0.78 0.94 0.84 0.97  CALCIUM 9.0 9.0 8.5* 8.9 9.1  MG  --  1.3* 1.3* 2.1  --    Liver Function Tests: Recent Labs  Lab 01/27/18 1526  AST 13*  ALT 10  ALKPHOS 80  BILITOT 0.6  PROT 6.6  ALBUMIN 3.6   No results for input(s): LIPASE, AMYLASE in the last 168 hours. No results for input(s): AMMONIA in the last 168 hours. CBC: Recent Labs  Lab 01/27/18 1526 01/28/18 0412 01/29/18 0512  WBC 9.5 12.7* 8.5  NEUTROABS 8.1*  --   --   HGB 10.2* 9.6* 8.7*  HCT 33.4* 32.0* 28.6*  MCV 93.8 96.1 95.7  PLT 237 233 200   Cardiac Enzymes: No results for input(s): CKTOTAL, CKMB, CKMBINDEX, TROPONINI in the last 168 hours. BNP: BNP (last 3 results) Recent Labs    08/11/17 2131 12/09/17 1304 01/27/18 1526  BNP 33.4 27.3 39.5    ProBNP (last 3 results) Recent Labs    01/10/18 1146 01/23/18 1406  PROBNP 27.0 199    CBG: Recent Labs  Lab 01/28/18 2027 01/29/18 1212 01/29/18 1945 01/30/18 0000 01/30/18 0758  GLUCAP 149* 139* 216* 80 123*       Signed:  Irine Seal MD.  [facility] 01/30/2018, 12:33 PM

## 2018-01-30 NOTE — Care Management Important Message (Signed)
Important Message  Patient Details  Name: KONNOR JORDEN MRN: [id number] Date of Birth: 1945-08-23   Medicare Important Message Given:  Yes    Kerin Salen 01/30/2018, 10:51 AMImportant Message  Patient Details  Name: ANIAYA BACHA MRN: [id number] Date of Birth: [birth year]   Medicare Important Message Given:  Yes    Kerin Salen 01/30/2018, 10:51 AM

## 2018-01-30 NOTE — Care Management Note (Signed)
Case Management Note  Patient Details  Name: [name] MRN: [id number] Date of Birth: 1945/08/21  This CM spoke with pt and son at bedside for DC planning. Choice offered for [facility] services and [facility] chosen. [facility] rep called for referral. Pt requesting HHPT/RN. She has a personal care aide through her Medicaid for 3/hrs a day.  Lynnell Catalan, RN 01/30/2018, 12:59 PM [phone]

## 2018-01-30 NOTE — Progress Notes (Signed)
Discharged instructions reviewed with patient. Son at bedside. Patient verbalized understanding of discharge instructions.

## 2018-01-30 NOTE — Telephone Encounter (Signed)
Spoke with patient's son. He stated that his mother has been in the hospital for PNA since Friday night. She may possibly go home today. He just wanted to make RB aware.   I advised him that RB is currently out of the office. He verbalized understanding, he just wants RB to review her hospital note when he returns.   Will route to RB.

## 2018-01-30 NOTE — Evaluation (Signed)
Occupational Therapy Evaluation Patient Details Name: [name] MRN: [id number] DOB: 1945/12/23 Today's Date: 01/30/2018    History of Present Illness 72 yo female admitted with Pna. Hx of NSCLC, COPD-O2 dep, CHF, DM, neuropathy, CAD, A fib, fibromyalgia   Clinical Impression   Pt admitted with PNA. Pt currently with functional limitations due to the deficits listed below (see OT Problem List).  Pt will benefit from skilled OT to increase their safety and independence with ADL and functional mobility for ADL to facilitate discharge to venue listed below.      Follow Up Recommendations  Supervision - Intermittent    Equipment Recommendations  None recommended by OT       Precautions / Restrictions Precautions Precautions: Fall Precaution Comments: monitor O2-O2 dep Restrictions Weight Bearing Restrictions: No      Mobility Bed Mobility   Bed Mobility: Supine to Sit     Supine to sit: Supervision     General bed mobility comments: for lines.   Transfers Overall transfer level: Needs assistance Equipment used: None Transfers: Sit to/from Stand Sit to Stand: Min guard         General transfer comment: close guard for safety.     Balance Overall balance assessment: Needs assistance           Standing balance-[name] Scale: Fair                             ADL either performed or assessed with clinical judgement   ADL Overall ADL's : Needs assistance/impaired Eating/Feeding: Set up;Sitting   Grooming: Set up;Sitting   Upper Body Bathing: Set up;Sitting   Lower Body Bathing: Minimal assistance;Sit to/from stand;Cueing for sequencing;Cueing for safety   Upper Body Dressing : Set up;Sitting   Lower Body Dressing: Minimal assistance;Sit to/from stand;Cueing for sequencing;Cueing for safety   Toilet Transfer: Min guard;Ambulation;Comfort height toilet   Toileting- Clothing Manipulation and Hygiene: Minimal assistance;Sit to/from  stand;Cueing for sequencing;Cueing for safety               Vision Patient Visual Report: No change from baseline       Perception     Praxis      Pertinent Vitals/Pain Pain Score: 5  Pain Descriptors / Indicators: Aching;Sore Pain Intervention(s): Limited activity within patient's tolerance           Communication Communication Communication: No difficulties   Cognition Arousal/Alertness: Awake/alert Behavior During Therapy: WFL for tasks assessed/performed Overall Cognitive Status: Within Functional Limits for tasks assessed                                                Home Living Family/patient expects to be discharged to:: Private residence Living Arrangements: Children Available Help at Discharge: Family;Available PRN/intermittently Type of Home: House Home Access: Stairs to enter [employer] of Steps: 10 front, 8 back Entrance Stairs-Rails: Right;Left;Can reach both Home Layout: One level     Bathroom Shower/Tub: Tub/shower unit         Home Equipment: Cane - single point;Shower seat;Walker - 2 wheels;Walker - 4 wheels;Grab bars - tub/shower;Transport chair   Additional Comments: 4L home O2      Prior Functioning/Environment Level of Independence: Independent with assistive device(s)        Comments: Mod indep with SPC. Limited to short  household distances due to SOB/fatigue. Daughter drives. Pt reports only leaves house for MD appointments. Sits in recliner majority of day        OT Problem List: Decreased strength;Decreased activity tolerance      OT Treatment/Interventions: Self-care/ADL training;DME and/or AE instruction;Patient/family education    OT Goals(Current goals can be found in the care plan section) Acute Rehab OT Goals Patient Stated Goal: none stated ADL Goals Pt Will Transfer to Toilet: with modified independence;ambulating;regular height toilet Pt Will Perform Toileting - Clothing  Manipulation and hygiene: with modified independence;sit to/from stand Additional ADL Goal #1: Pt will verbalize energy conservation strategies during  at ADL activity  OT Frequency: Min 2X/week              AM-PAC PT "6 Clicks" Daily Activity     Outcome Measure Help from another person eating meals?: None Help from another person taking care of personal grooming?: A Little Help from another person toileting, which includes using toliet, bedpan, or urinal?: A Little Help from another person bathing (including washing, rinsing, drying)?: None Help from another person to put on and taking off regular upper body clothing?: A Little Help from another person to put on and taking off regular lower body clothing?: A Little 6 Click Score: 20   End of Session Equipment Utilized During Treatment: Rolling walker Nurse Communication: Mobility status  Activity Tolerance: Patient tolerated treatment well Patient left: in chair;with call bell/phone within reach  OT Visit Diagnosis: Unsteadiness on feet (R26.81);History of falling (Z91.81)                Time: 0761-5183 OT Time Calculation (min): 20 min Charges:  OT General Charges $OT Visit: 1 Visit OT Evaluation $OT Eval Moderate Complexity: 1 Mod  Kari Baars, [place] [phone] Office- [phone]     Avan Gullett, Edwena Felty D 01/30/2018, 10:59 AM

## 2018-01-31 ENCOUNTER — Telehealth: Payer: Self-pay | Admitting: Emergency Medicine

## 2018-01-31 NOTE — Telephone Encounter (Signed)
Spoke with patient. She wanted to know how many times she could use her neb machine. Advised patient that based on her chart, she can use the albuterol once every 6 hours as needed. She verbalized understanding.   She also wanted to know if [facility] would refill her Xanax. Advised patient that she needed to speak with her PCP about this. She verbalized understanding.   Nothing further needed at time of call.

## 2018-02-01 LAB — CULTURE, BLOOD (ROUTINE X 2)
Culture: NO GROWTH
Culture: NO GROWTH
SPECIAL REQUESTS: ADEQUATE
Special Requests: ADEQUATE

## 2018-02-02 ENCOUNTER — Other Ambulatory Visit: Payer: Medicare Other | Admitting: Licensed Clinical Social Worker

## 2018-02-02 ENCOUNTER — Other Ambulatory Visit: Payer: Medicare Other | Admitting: *Deleted

## 2018-02-02 DIAGNOSIS — Z7952 Long term (current) use of systemic steroids: Secondary | ICD-10-CM | POA: Diagnosis not present

## 2018-02-02 DIAGNOSIS — Z7982 Long term (current) use of aspirin: Secondary | ICD-10-CM | POA: Diagnosis not present

## 2018-02-02 DIAGNOSIS — D630 Anemia in neoplastic disease: Secondary | ICD-10-CM | POA: Diagnosis not present

## 2018-02-02 DIAGNOSIS — J440 Chronic obstructive pulmonary disease with acute lower respiratory infection: Secondary | ICD-10-CM | POA: Diagnosis not present

## 2018-02-02 DIAGNOSIS — E1151 Type 2 diabetes mellitus with diabetic peripheral angiopathy without gangrene: Secondary | ICD-10-CM | POA: Diagnosis not present

## 2018-02-02 DIAGNOSIS — Z87891 Personal history of nicotine dependence: Secondary | ICD-10-CM | POA: Diagnosis not present

## 2018-02-02 DIAGNOSIS — Z9981 Dependence on supplemental oxygen: Secondary | ICD-10-CM | POA: Diagnosis not present

## 2018-02-02 DIAGNOSIS — Z7984 Long term (current) use of oral hypoglycemic drugs: Secondary | ICD-10-CM | POA: Diagnosis not present

## 2018-02-02 DIAGNOSIS — Z515 Encounter for palliative care: Secondary | ICD-10-CM

## 2018-02-02 DIAGNOSIS — I480 Paroxysmal atrial fibrillation: Secondary | ICD-10-CM | POA: Diagnosis not present

## 2018-02-02 DIAGNOSIS — J9691 Respiratory failure, unspecified with hypoxia: Secondary | ICD-10-CM | POA: Diagnosis not present

## 2018-02-02 DIAGNOSIS — C3412 Malignant neoplasm of upper lobe, left bronchus or lung: Secondary | ICD-10-CM | POA: Diagnosis not present

## 2018-02-02 DIAGNOSIS — I110 Hypertensive heart disease with heart failure: Secondary | ICD-10-CM | POA: Diagnosis not present

## 2018-02-02 DIAGNOSIS — J189 Pneumonia, unspecified organism: Secondary | ICD-10-CM | POA: Diagnosis not present

## 2018-02-02 DIAGNOSIS — M797 Fibromyalgia: Secondary | ICD-10-CM | POA: Diagnosis not present

## 2018-02-02 DIAGNOSIS — F419 Anxiety disorder, unspecified: Secondary | ICD-10-CM | POA: Diagnosis not present

## 2018-02-02 DIAGNOSIS — G8929 Other chronic pain: Secondary | ICD-10-CM | POA: Diagnosis not present

## 2018-02-02 DIAGNOSIS — E1142 Type 2 diabetes mellitus with diabetic polyneuropathy: Secondary | ICD-10-CM | POA: Diagnosis not present

## 2018-02-02 DIAGNOSIS — I5030 Unspecified diastolic (congestive) heart failure: Secondary | ICD-10-CM | POA: Diagnosis not present

## 2018-02-02 NOTE — Progress Notes (Signed)
COMMUNITY PALLIATIVE CARE SW NOTE  PATIENT NAME: [name] DOB: Oct 28, 1945 MRN: [id number]  PRIMARY CARE PROVIDER: [name], DO  RESPONSIBLE PARTY:  Acct ID - Guarantor Home Phone Work Phone Relationship Acct Type  [id number] LASHEA, [phone]  Self P/F     [address] [place], [place] [address]     PLAN OF CARE and INTERVENTIONS:             1. GOALS OF CARE/ ADVANCE CARE PLANNING:  Patient wishes to continue living with her daughter.  Patient's EMR indicates she is a full code. 2. SOCIAL/EMOTIONAL/SPIRITUAL ASSESSMENT/ INTERVENTIONS:  SW and Palliative Care RN, Daryl Eastern, met with patient and her CNA from [place].  Patient was sitting in a recliner in her room with her legs elevated.  She did not recall having made the appointment with SW the day before.  Patient provided her psychosocial history, but began falling asleep.  The [facility] RN had helped arrange patient's medications earlier and patient had recently taken pain medication.  Patient lives with her daughter.  She has a son in [place].  Patient had a daughter who was 15 years old when she died from a drug overdose.  Patient has lived with her daughter for three months.  She said she had to give up her apartment and her dog.  Patient expressed feelings of loss.  Provided active listening and supportive counseling. 3. PATIENT/CAREGIVER EDUCATION/ COPING:  Patient copes by expressing her feelings openly. 4. PERSONAL EMERGENCY PLAN:  Patient informs her daughter or caregiver of medical concerns. 5. COMMUNITY RESOURCES COORDINATION/ HEALTH CARE NAVIGATION:  Patient has private caregivers for three hours a day. 6. FINANCIAL/LEGAL CONCERNS/INTERVENTIONS:  Patient is on a fixed income.     SOCIAL HX:  Social History   Tobacco Use  . Smoking status: Former Smoker    Packs/day: 1.00    Years: 40.00    Pack years: 40.00    Types: Cigarettes    Last attempt to quit: 02/10/1998    Years since  quitting: 19.9  . Smokeless tobacco: Never Used  Substance Use Topics  . Alcohol use: No    CODE STATUS:  Full Code ADVANCED DIRECTIVES: N MOST FORM COMPLETE:  N HOSPICE EDUCATION PROVIDED:  N PPS: Patient reports her appetite is poor.  She uses a can to ambulate in the house and a w/c to go to the doctor's office. Duration of visit and documentation:  45 minutes.      Creola Corn Deandrae Wajda, LCSW

## 2018-02-05 ENCOUNTER — Other Ambulatory Visit: Payer: Self-pay | Admitting: Oncology

## 2018-02-05 ENCOUNTER — Other Ambulatory Visit: Payer: Self-pay | Admitting: Emergency Medicine

## 2018-02-06 DIAGNOSIS — C3412 Malignant neoplasm of upper lobe, left bronchus or lung: Secondary | ICD-10-CM | POA: Diagnosis not present

## 2018-02-06 DIAGNOSIS — J9691 Respiratory failure, unspecified with hypoxia: Secondary | ICD-10-CM | POA: Diagnosis not present

## 2018-02-06 DIAGNOSIS — J440 Chronic obstructive pulmonary disease with acute lower respiratory infection: Secondary | ICD-10-CM | POA: Diagnosis not present

## 2018-02-06 DIAGNOSIS — J189 Pneumonia, unspecified organism: Secondary | ICD-10-CM | POA: Diagnosis not present

## 2018-02-06 DIAGNOSIS — I110 Hypertensive heart disease with heart failure: Secondary | ICD-10-CM | POA: Diagnosis not present

## 2018-02-06 DIAGNOSIS — I5030 Unspecified diastolic (congestive) heart failure: Secondary | ICD-10-CM | POA: Diagnosis not present

## 2018-02-06 NOTE — Progress Notes (Signed)
COMMUNITY PALLIATIVE CARE RN NOTE  PATIENT NAME: [name] DOB: 1946-01-23 MRN: [id number]  PRIMARY CARE PROVIDER: [name], DO  RESPONSIBLE PARTY:  Acct ID - Guarantor Home Phone Work Phone Relationship Acct Type  [id number] ADDISYNN, [phone]  Self P/F     [address], [place], [place] [address]    PLAN OF CARE and INTERVENTION:  1. ADVANCE CARE PLANNING/GOALS OF CARE: Remain in the home with her daughter and avoid hospitalizations 2. PATIENT/CAREGIVER EDUCATION: Explained Palliative Care Services, Safe Mobility and Fall Prevention 3. DISEASE STATUS: Joint visit made with Palliative Care SW, Lynn Duffy. Met with patient in her bedroom with hired caregiver present. She is alert and oriented x 2 (person/place). Intermittently confused and forgetful, but able to answer simple questions appropriately. She provided recent health history. She was recently hospitalized for PNA. She is currently on a course of antibiotics. She reports generalized pain. Hydrocodone is effective. She was having issues with her medications (not knowing which medications to continue/discontinue s/p hospital discharge). She is currently being followed by [facility] RN who visited patient this am and clarified patient's medications. She is also being followed by a Physical Therapist. She is able to ambulate short distances using her cane. She is transported via wheelchair when travelling outside of the home. Requires 1 person assistance with bathing and dressing. She reports generalized weakness and spending most of her time sitting up in recliner. She is on Oxygen continuously at 3L/min via [place]. No dyspnea noted during conversation, however is reported on exertion. Her intake is poor at this time. She reports early satiety or no feelings of hunger. Denies dysphagia. She is having difficulties sleeping during the night, so was sleepy during visit. She is hopeful that with medication clarification that she  will rest better overall. She is requesting a bedside commode and rails for her bathroom. Will reach out to PCP for order for [facility] and collaborate with [facility] on rail issue. Patient is agreeable to monthly Palliative Care visits. Will continue to monitor.   HISTORY OF PRESENT ILLNESS:  This is a 72 yo female who resides with her daughter. She was recently hospitalized from 01/27/18 - 01/30/18 with Left lobe PNA. Palliative Care Team was asked to follow patient to assess overall condition and assist with symptom management needs. Next visit scheduled in 1 month.   CODE STATUS: FULL CODE ADVANCED DIRECTIVES: N MOST FORM: no PPS: 40%   PHYSICAL EXAM:   LUNGS: decreased breath sounds CARDIAC: Cor RRR EXTREMITIES: Trace edema to bilateral lower extremitites SKIN: Exposed skin is dry and intact, denies any skin breakdown  NEURO: Alert and oriented x 2, intermittent confusion, forgetful, generalized weakness, ambulatory with cane in the home   (Duration of visit and documentation 75 minutes)    Daryl Eastern, RN, BSN

## 2018-02-07 ENCOUNTER — Ambulatory Visit (HOSPITAL_COMMUNITY)
Admission: RE | Admit: 2018-02-07 | Discharge: 2018-02-07 | Disposition: A | Discharge status: Home / Self Care | Payer: Medicare Other | Source: Ambulatory Visit | Attending: Internal Medicine | Admitting: Internal Medicine

## 2018-02-07 ENCOUNTER — Inpatient Hospital Stay: Payer: Medicare Other | Attending: Internal Medicine

## 2018-02-07 DIAGNOSIS — J90 Pleural effusion, not elsewhere classified: Secondary | ICD-10-CM | POA: Diagnosis not present

## 2018-02-07 DIAGNOSIS — C3490 Malignant neoplasm of unspecified part of unspecified bronchus or lung: Secondary | ICD-10-CM

## 2018-02-07 DIAGNOSIS — J9811 Atelectasis: Secondary | ICD-10-CM | POA: Diagnosis not present

## 2018-02-07 DIAGNOSIS — I700 Atherosclerosis of aorta: Secondary | ICD-10-CM | POA: Insufficient documentation

## 2018-02-07 DIAGNOSIS — R0602 Shortness of breath: Secondary | ICD-10-CM | POA: Diagnosis not present

## 2018-02-07 DIAGNOSIS — R918 Other nonspecific abnormal finding of lung field: Secondary | ICD-10-CM | POA: Diagnosis not present

## 2018-02-07 DIAGNOSIS — I2510 Atherosclerotic heart disease of native coronary artery without angina pectoris: Secondary | ICD-10-CM | POA: Insufficient documentation

## 2018-02-07 DIAGNOSIS — J439 Emphysema, unspecified: Secondary | ICD-10-CM | POA: Insufficient documentation

## 2018-02-07 DIAGNOSIS — C3491 Malignant neoplasm of unspecified part of right bronchus or lung: Secondary | ICD-10-CM | POA: Diagnosis not present

## 2018-02-07 DIAGNOSIS — I110 Hypertensive heart disease with heart failure: Secondary | ICD-10-CM | POA: Diagnosis not present

## 2018-02-07 DIAGNOSIS — R5382 Chronic fatigue, unspecified: Secondary | ICD-10-CM

## 2018-02-07 DIAGNOSIS — I5033 Acute on chronic diastolic (congestive) heart failure: Secondary | ICD-10-CM | POA: Diagnosis not present

## 2018-02-07 DIAGNOSIS — C3411 Malignant neoplasm of upper lobe, right bronchus or lung: Secondary | ICD-10-CM

## 2018-02-07 DIAGNOSIS — R000 Tachycardia, unspecified: Secondary | ICD-10-CM | POA: Diagnosis not present

## 2018-02-07 DIAGNOSIS — Z5112 Encounter for antineoplastic immunotherapy: Secondary | ICD-10-CM

## 2018-02-07 DIAGNOSIS — J9621 Acute and chronic respiratory failure with hypoxia: Secondary | ICD-10-CM | POA: Diagnosis not present

## 2018-02-07 LAB — CMP (CANCER CENTER ONLY)
ALT: 10 U/L (ref 0–44)
AST: 13 U/L — AB (ref 15–41)
Albumin: 3.6 g/dL (ref 3.5–5.0)
Alkaline Phosphatase: 84 U/L (ref 38–126)
Anion gap: 11 (ref 5–15)
BUN: 15 mg/dL (ref 8–23)
CHLORIDE: 102 mmol/L (ref 98–111)
CO2: 29 mmol/L (ref 22–32)
CREATININE: 1.14 mg/dL — AB (ref 0.44–1.00)
Calcium: 9.6 mg/dL (ref 8.9–10.3)
GFR, EST NON AFRICAN AMERICAN: 47 mL/min — AB (ref >60)
GFR, Est AFR Am: 54 mL/min — ABNORMAL LOW (ref >60)
Glucose, Bld: 159 mg/dL — ABNORMAL HIGH (ref 70–99)
POTASSIUM: 3.8 mmol/L (ref 3.5–5.1)
SODIUM: 142 mmol/L (ref 135–145)
Total Bilirubin: 0.5 mg/dL (ref 0.3–1.2)
Total Protein: 6.6 g/dL (ref 6.5–8.1)

## 2018-02-07 LAB — CBC WITH DIFFERENTIAL (CANCER CENTER ONLY)
ABS IMMATURE GRANULOCYTES: 0.06 10*3/uL (ref 0.00–0.07)
BASOS ABS: 0 10*3/uL (ref 0.0–0.1)
Basophils Relative: 0 %
Eosinophils Absolute: 0.1 10*3/uL (ref 0.0–0.5)
Eosinophils Relative: 1 %
HEMATOCRIT: 35.2 % — AB (ref 36.0–46.0)
HEMOGLOBIN: 10.7 g/dL — AB (ref 12.0–15.0)
Immature Granulocytes: 1 %
LYMPHS ABS: 0.7 10*3/uL (ref 0.7–4.0)
Lymphocytes Relative: 6 %
MCH: 29.2 pg (ref 26.0–34.0)
MCHC: 30.4 g/dL (ref 30.0–36.0)
MCV: 96.2 fL (ref 80.0–100.0)
MONO ABS: 0.5 10*3/uL (ref 0.1–1.0)
Monocytes Relative: 4 %
NEUTROS ABS: 10.9 10*3/uL — AB (ref 1.7–7.7)
NRBC: 0 % (ref 0.0–0.2)
Neutrophils Relative %: 88 %
PLATELETS: 280 10*3/uL (ref 150–400)
RBC: 3.66 MIL/uL — AB (ref 3.87–5.11)
RDW: 14.8 % (ref 11.5–15.5)
WBC: 12.3 10*3/uL — AB (ref 4.0–10.5)

## 2018-02-07 LAB — TSH: TSH: 0.838 u[IU]/mL (ref 0.308–3.960)

## 2018-02-08 ENCOUNTER — Inpatient Hospital Stay (HOSPITAL_COMMUNITY)
Admission: EM | Admit: 2018-02-08 | Discharge: 2018-02-11 | DRG: 291 | Disposition: A | Discharge status: Hospice | Payer: Medicare Other | Attending: Internal Medicine | Admitting: Internal Medicine

## 2018-02-08 ENCOUNTER — Emergency Department (HOSPITAL_COMMUNITY): Payer: Medicare Other

## 2018-02-08 ENCOUNTER — Telehealth: Payer: Self-pay | Admitting: Emergency Medicine

## 2018-02-08 ENCOUNTER — Other Ambulatory Visit: Payer: Self-pay

## 2018-02-08 DIAGNOSIS — F411 Generalized anxiety disorder: Secondary | ICD-10-CM | POA: Diagnosis not present

## 2018-02-08 DIAGNOSIS — J9811 Atelectasis: Secondary | ICD-10-CM | POA: Diagnosis present

## 2018-02-08 DIAGNOSIS — Z888 Allergy status to other drugs, medicaments and biological substances status: Secondary | ICD-10-CM | POA: Diagnosis not present

## 2018-02-08 DIAGNOSIS — I959 Hypotension, unspecified: Secondary | ICD-10-CM | POA: Diagnosis not present

## 2018-02-08 DIAGNOSIS — J90 Pleural effusion, not elsewhere classified: Secondary | ICD-10-CM | POA: Diagnosis present

## 2018-02-08 DIAGNOSIS — Z9981 Dependence on supplemental oxygen: Secondary | ICD-10-CM

## 2018-02-08 DIAGNOSIS — Z923 Personal history of irradiation: Secondary | ICD-10-CM | POA: Diagnosis not present

## 2018-02-08 DIAGNOSIS — R000 Tachycardia, unspecified: Secondary | ICD-10-CM | POA: Diagnosis not present

## 2018-02-08 DIAGNOSIS — Z79899 Other long term (current) drug therapy: Secondary | ICD-10-CM

## 2018-02-08 DIAGNOSIS — Z885 Allergy status to narcotic agent status: Secondary | ICD-10-CM | POA: Diagnosis not present

## 2018-02-08 DIAGNOSIS — R05 Cough: Secondary | ICD-10-CM | POA: Diagnosis not present

## 2018-02-08 DIAGNOSIS — Z9221 Personal history of antineoplastic chemotherapy: Secondary | ICD-10-CM

## 2018-02-08 DIAGNOSIS — I2510 Atherosclerotic heart disease of native coronary artery without angina pectoris: Secondary | ICD-10-CM | POA: Diagnosis present

## 2018-02-08 DIAGNOSIS — E1142 Type 2 diabetes mellitus with diabetic polyneuropathy: Secondary | ICD-10-CM | POA: Diagnosis present

## 2018-02-08 DIAGNOSIS — C3411 Malignant neoplasm of upper lobe, right bronchus or lung: Secondary | ICD-10-CM | POA: Diagnosis not present

## 2018-02-08 DIAGNOSIS — R0602 Shortness of breath: Secondary | ICD-10-CM

## 2018-02-08 DIAGNOSIS — J449 Chronic obstructive pulmonary disease, unspecified: Secondary | ICD-10-CM | POA: Diagnosis not present

## 2018-02-08 DIAGNOSIS — G893 Neoplasm related pain (acute) (chronic): Secondary | ICD-10-CM | POA: Diagnosis not present

## 2018-02-08 DIAGNOSIS — R627 Adult failure to thrive: Secondary | ICD-10-CM | POA: Diagnosis present

## 2018-02-08 DIAGNOSIS — E1151 Type 2 diabetes mellitus with diabetic peripheral angiopathy without gangrene: Secondary | ICD-10-CM | POA: Diagnosis present

## 2018-02-08 DIAGNOSIS — J439 Emphysema, unspecified: Secondary | ICD-10-CM | POA: Diagnosis present

## 2018-02-08 DIAGNOSIS — R918 Other nonspecific abnormal finding of lung field: Secondary | ICD-10-CM | POA: Diagnosis not present

## 2018-02-08 DIAGNOSIS — I5033 Acute on chronic diastolic (congestive) heart failure: Secondary | ICD-10-CM | POA: Diagnosis present

## 2018-02-08 DIAGNOSIS — Z7189 Other specified counseling: Secondary | ICD-10-CM | POA: Diagnosis not present

## 2018-02-08 DIAGNOSIS — J440 Chronic obstructive pulmonary disease with acute lower respiratory infection: Secondary | ICD-10-CM | POA: Diagnosis not present

## 2018-02-08 DIAGNOSIS — C3490 Malignant neoplasm of unspecified part of unspecified bronchus or lung: Secondary | ICD-10-CM | POA: Diagnosis not present

## 2018-02-08 DIAGNOSIS — M797 Fibromyalgia: Secondary | ICD-10-CM | POA: Diagnosis present

## 2018-02-08 DIAGNOSIS — I110 Hypertensive heart disease with heart failure: Secondary | ICD-10-CM | POA: Diagnosis present

## 2018-02-08 DIAGNOSIS — I5032 Chronic diastolic (congestive) heart failure: Secondary | ICD-10-CM

## 2018-02-08 DIAGNOSIS — I4891 Unspecified atrial fibrillation: Secondary | ICD-10-CM | POA: Diagnosis present

## 2018-02-08 DIAGNOSIS — C3491 Malignant neoplasm of unspecified part of right bronchus or lung: Secondary | ICD-10-CM | POA: Diagnosis present

## 2018-02-08 DIAGNOSIS — F329 Major depressive disorder, single episode, unspecified: Secondary | ICD-10-CM | POA: Diagnosis not present

## 2018-02-08 DIAGNOSIS — G2581 Restless legs syndrome: Secondary | ICD-10-CM | POA: Diagnosis present

## 2018-02-08 DIAGNOSIS — Z515 Encounter for palliative care: Secondary | ICD-10-CM | POA: Diagnosis present

## 2018-02-08 DIAGNOSIS — F419 Anxiety disorder, unspecified: Secondary | ICD-10-CM

## 2018-02-08 DIAGNOSIS — G8929 Other chronic pain: Secondary | ICD-10-CM | POA: Diagnosis present

## 2018-02-08 DIAGNOSIS — H9191 Unspecified hearing loss, right ear: Secondary | ICD-10-CM | POA: Diagnosis present

## 2018-02-08 DIAGNOSIS — Z96642 Presence of left artificial hip joint: Secondary | ICD-10-CM | POA: Diagnosis present

## 2018-02-08 DIAGNOSIS — Z86718 Personal history of other venous thrombosis and embolism: Secondary | ICD-10-CM | POA: Diagnosis not present

## 2018-02-08 DIAGNOSIS — I70209 Unspecified atherosclerosis of native arteries of extremities, unspecified extremity: Secondary | ICD-10-CM | POA: Diagnosis present

## 2018-02-08 DIAGNOSIS — J910 Malignant pleural effusion: Secondary | ICD-10-CM | POA: Diagnosis not present

## 2018-02-08 DIAGNOSIS — M810 Age-related osteoporosis without current pathological fracture: Secondary | ICD-10-CM | POA: Diagnosis present

## 2018-02-08 DIAGNOSIS — J9621 Acute and chronic respiratory failure with hypoxia: Secondary | ICD-10-CM | POA: Diagnosis present

## 2018-02-08 DIAGNOSIS — I5030 Unspecified diastolic (congestive) heart failure: Secondary | ICD-10-CM | POA: Diagnosis not present

## 2018-02-08 DIAGNOSIS — Z66 Do not resuscitate: Secondary | ICD-10-CM | POA: Diagnosis present

## 2018-02-08 DIAGNOSIS — C384 Malignant neoplasm of pleura: Secondary | ICD-10-CM | POA: Diagnosis not present

## 2018-02-08 DIAGNOSIS — R0600 Dyspnea, unspecified: Secondary | ICD-10-CM | POA: Diagnosis not present

## 2018-02-08 DIAGNOSIS — Z7984 Long term (current) use of oral hypoglycemic drugs: Secondary | ICD-10-CM

## 2018-02-08 DIAGNOSIS — Z7951 Long term (current) use of inhaled steroids: Secondary | ICD-10-CM

## 2018-02-08 DIAGNOSIS — E785 Hyperlipidemia, unspecified: Secondary | ICD-10-CM | POA: Diagnosis present

## 2018-02-08 DIAGNOSIS — Z87891 Personal history of nicotine dependence: Secondary | ICD-10-CM

## 2018-02-08 DIAGNOSIS — J9691 Respiratory failure, unspecified with hypoxia: Secondary | ICD-10-CM | POA: Diagnosis not present

## 2018-02-08 DIAGNOSIS — F32A Depression, unspecified: Secondary | ICD-10-CM | POA: Diagnosis present

## 2018-02-08 DIAGNOSIS — J189 Pneumonia, unspecified organism: Secondary | ICD-10-CM | POA: Diagnosis not present

## 2018-02-08 DIAGNOSIS — Z9889 Other specified postprocedural states: Secondary | ICD-10-CM | POA: Diagnosis not present

## 2018-02-08 DIAGNOSIS — Z7982 Long term (current) use of aspirin: Secondary | ICD-10-CM

## 2018-02-08 DIAGNOSIS — C3412 Malignant neoplasm of upper lobe, left bronchus or lung: Secondary | ICD-10-CM | POA: Diagnosis not present

## 2018-02-08 DIAGNOSIS — J181 Lobar pneumonia, unspecified organism: Secondary | ICD-10-CM | POA: Diagnosis not present

## 2018-02-08 LAB — COMPREHENSIVE METABOLIC PANEL
ALBUMIN: 3.5 g/dL (ref 3.5–5.0)
ALK PHOS: 72 U/L (ref 38–126)
ALT: 13 U/L (ref 0–44)
AST: 16 U/L (ref 15–41)
Anion gap: 8 (ref 5–15)
BILIRUBIN TOTAL: 0.7 mg/dL (ref 0.3–1.2)
BUN: 14 mg/dL (ref 8–23)
CO2: 28 mmol/L (ref 22–32)
CREATININE: 1.02 mg/dL — AB (ref 0.44–1.00)
Calcium: 9.2 mg/dL (ref 8.9–10.3)
Chloride: 103 mmol/L (ref 98–111)
GFR calc Af Amer: >60 mL/min (ref >60)
GFR, EST NON AFRICAN AMERICAN: 53 mL/min — AB (ref >60)
GLUCOSE: 157 mg/dL — AB (ref 70–99)
Potassium: 4 mmol/L (ref 3.5–5.1)
Sodium: 139 mmol/L (ref 135–145)
TOTAL PROTEIN: 6.4 g/dL — AB (ref 6.5–8.1)

## 2018-02-08 LAB — URINALYSIS, ROUTINE W REFLEX MICROSCOPIC
Bacteria, UA: NONE SEEN
Bilirubin Urine: NEGATIVE
GLUCOSE, UA: NEGATIVE mg/dL
Ketones, ur: NEGATIVE mg/dL
LEUKOCYTES UA: NEGATIVE
Nitrite: NEGATIVE
PROTEIN: NEGATIVE mg/dL
Specific Gravity, Urine: 1.006 (ref 1.005–1.030)
pH: 6 (ref 5.0–8.0)

## 2018-02-08 LAB — CBC WITH DIFFERENTIAL/PLATELET
ABS IMMATURE GRANULOCYTES: 0.04 10*3/uL (ref 0.00–0.07)
BASOS PCT: 0 %
Basophils Absolute: 0 10*3/uL (ref 0.0–0.1)
EOS ABS: 0 10*3/uL (ref 0.0–0.5)
Eosinophils Relative: 0 %
HEMATOCRIT: 35.8 % — AB (ref 36.0–46.0)
Hemoglobin: 11 g/dL — ABNORMAL LOW (ref 12.0–15.0)
Immature Granulocytes: 0 %
LYMPHS ABS: 0.7 10*3/uL (ref 0.7–4.0)
Lymphocytes Relative: 6 %
MCH: 29.1 pg (ref 26.0–34.0)
MCHC: 30.7 g/dL (ref 30.0–36.0)
MCV: 94.7 fL (ref 80.0–100.0)
MONO ABS: 0.4 10*3/uL (ref 0.1–1.0)
MONOS PCT: 3 %
NEUTROS PCT: 91 %
Neutro Abs: 10.3 10*3/uL — ABNORMAL HIGH (ref 1.7–7.7)
Platelets: 282 10*3/uL (ref 150–400)
RBC: 3.78 MIL/uL — ABNORMAL LOW (ref 3.87–5.11)
RDW: 14.5 % (ref 11.5–15.5)
WBC: 11.5 10*3/uL — ABNORMAL HIGH (ref 4.0–10.5)
nRBC: 0 % (ref 0.0–0.2)

## 2018-02-08 LAB — I-STAT VENOUS BLOOD GAS, ED
ACID-BASE EXCESS: 9 mmol/L — AB (ref 0.0–2.0)
BICARBONATE: 35.1 mmol/L — AB (ref 20.0–28.0)
O2 Saturation: 65 %
PH VEN: 7.432 — AB (ref 7.250–7.430)
TCO2: 37 mmol/L — AB (ref 22–32)
pCO2, Ven: 52.6 mmHg (ref 44.0–60.0)
pO2, Ven: 34 mmHg (ref 32.0–45.0)

## 2018-02-08 LAB — CBG MONITORING, ED: Glucose-Capillary: 119 mg/dL — ABNORMAL HIGH (ref 70–99)

## 2018-02-08 LAB — I-STAT CG4 LACTIC ACID, ED
LACTIC ACID, VENOUS: 1.92 mmol/L — AB (ref 0.5–1.9)
Lactic Acid, Venous: 0.94 mmol/L (ref 0.5–1.9)

## 2018-02-08 LAB — GLUCOSE, CAPILLARY: Glucose-Capillary: 152 mg/dL — ABNORMAL HIGH (ref 70–99)

## 2018-02-08 LAB — PROTIME-INR
INR: 0.96
PROTHROMBIN TIME: 12.7 s (ref 11.4–15.2)

## 2018-02-08 MED ORDER — METOPROLOL TARTRATE 25 MG PO TABS
25.0000 mg | ORAL_TABLET | Freq: Two times a day (BID) | ORAL | Status: DC
  Administered 2018-02-08 – 2018-02-10 (×3): 25 mg via ORAL
  Filled 2018-02-08 (×3): qty 1

## 2018-02-08 MED ORDER — SIMETHICONE 80 MG PO CHEW
80.0000 mg | CHEWABLE_TABLET | Freq: Four times a day (QID) | ORAL | Status: DC | PRN
  Filled 2018-02-08: qty 1

## 2018-02-08 MED ORDER — ENOXAPARIN SODIUM 40 MG/0.4ML ~~LOC~~ SOLN
40.0000 mg | Freq: Every day | SUBCUTANEOUS | Status: DC
  Administered 2018-02-08 – 2018-02-10 (×3): 40 mg via SUBCUTANEOUS
  Filled 2018-02-08 (×4): qty 0.4

## 2018-02-08 MED ORDER — ASPIRIN EC 81 MG PO TBEC
81.0000 mg | DELAYED_RELEASE_TABLET | Freq: Every day | ORAL | Status: DC
  Administered 2018-02-09 – 2018-02-11 (×3): 81 mg via ORAL
  Filled 2018-02-08 (×3): qty 1

## 2018-02-08 MED ORDER — IPRATROPIUM-ALBUTEROL 0.5-2.5 (3) MG/3ML IN SOLN
3.0000 mL | Freq: Four times a day (QID) | RESPIRATORY_TRACT | Status: DC
  Administered 2018-02-08 – 2018-02-09 (×4): 3 mL via RESPIRATORY_TRACT
  Filled 2018-02-08 (×4): qty 3

## 2018-02-08 MED ORDER — PREDNISONE 10 MG PO TABS
10.0000 mg | ORAL_TABLET | Freq: Every day | ORAL | Status: DC
  Administered 2018-02-09 – 2018-02-11 (×3): 10 mg via ORAL
  Filled 2018-02-08 (×3): qty 1

## 2018-02-08 MED ORDER — HYDROCODONE-ACETAMINOPHEN 10-325 MG PO TABS
1.0000 | ORAL_TABLET | Freq: Four times a day (QID) | ORAL | Status: DC | PRN
  Administered 2018-02-08: 1 via ORAL
  Filled 2018-02-08: qty 1

## 2018-02-08 MED ORDER — INSULIN ASPART 100 UNIT/ML ~~LOC~~ SOLN
0.0000 [IU] | Freq: Three times a day (TID) | SUBCUTANEOUS | Status: DC
  Administered 2018-02-09: 2 [IU] via SUBCUTANEOUS
  Administered 2018-02-09: 5 [IU] via SUBCUTANEOUS

## 2018-02-08 MED ORDER — FUROSEMIDE 10 MG/ML IJ SOLN
40.0000 mg | Freq: Two times a day (BID) | INTRAMUSCULAR | Status: DC
  Administered 2018-02-09 (×2): 40 mg via INTRAVENOUS
  Filled 2018-02-08 (×3): qty 4

## 2018-02-08 MED ORDER — TRAMADOL HCL 50 MG PO TABS: 100.0000 mg | ORAL_TABLET | Freq: Four times a day (QID) | ORAL | Status: DC | PRN

## 2018-02-08 MED ORDER — GUAIFENESIN ER 600 MG PO TB12
1200.0000 mg | ORAL_TABLET | Freq: Two times a day (BID) | ORAL | Status: DC
  Administered 2018-02-08 – 2018-02-11 (×6): 1200 mg via ORAL
  Filled 2018-02-08 (×6): qty 2

## 2018-02-08 MED ORDER — ONDANSETRON HCL 4 MG PO TABS: 4.0000 mg | ORAL_TABLET | Freq: Three times a day (TID) | ORAL | Status: DC | PRN

## 2018-02-08 MED ORDER — ALPRAZOLAM 0.5 MG PO TABS
0.5000 mg | ORAL_TABLET | Freq: Three times a day (TID) | ORAL | Status: DC | PRN
  Administered 2018-02-08 – 2018-02-10 (×5): 0.5 mg via ORAL
  Filled 2018-02-08 (×6): qty 1

## 2018-02-08 MED ORDER — FUROSEMIDE 10 MG/ML IJ SOLN
40.0000 mg | Freq: Once | INTRAMUSCULAR | Status: AC
  Administered 2018-02-08: 40 mg via INTRAVENOUS
  Filled 2018-02-08: qty 4

## 2018-02-08 MED ORDER — LORATADINE 10 MG PO TABS
10.0000 mg | ORAL_TABLET | Freq: Every day | ORAL | Status: DC
  Administered 2018-02-09 – 2018-02-11 (×3): 10 mg via ORAL
  Filled 2018-02-08 (×3): qty 1

## 2018-02-08 MED ORDER — SALINE SPRAY 0.65 % NA SOLN
1.0000 | NASAL | Status: DC | PRN
  Filled 2018-02-08: qty 44

## 2018-02-08 MED ORDER — IRBESARTAN 75 MG PO TABS
75.0000 mg | ORAL_TABLET | Freq: Every day | ORAL | Status: DC
  Administered 2018-02-09 – 2018-02-10 (×2): 75 mg via ORAL
  Filled 2018-02-08 (×2): qty 1

## 2018-02-08 MED ORDER — PANTOPRAZOLE SODIUM 40 MG PO TBEC
40.0000 mg | DELAYED_RELEASE_TABLET | Freq: Two times a day (BID) | ORAL | Status: DC
  Administered 2018-02-08 – 2018-02-11 (×6): 40 mg via ORAL
  Filled 2018-02-08 (×6): qty 1

## 2018-02-08 MED ORDER — SENNA 8.6 MG PO TABS: 1.0000 | ORAL_TABLET | Freq: Two times a day (BID) | ORAL | Status: DC | PRN

## 2018-02-08 MED ORDER — NITROGLYCERIN 0.4 MG SL SUBL: 0.4000 mg | SUBLINGUAL_TABLET | SUBLINGUAL | Status: DC | PRN

## 2018-02-08 NOTE — Consult Note (Signed)
NAME:  [name], MRN:  [id number], DOB:  04/17/1945, LOS: 0 ADMISSION DATE:  02/08/2018, CONSULTATION DATE:  10/30 REFERRING MD:  Jeanell Sparrow, CHIEF COMPLAINT:  Dyspnea    Brief History   72 year old female with chronic respiratory failure in setting of advanced COPD, diastolic heart failure, and deconditioning just discharged from hospital.  Presents back to the emergency room on 10/39 days following discharge for progressive shortness of breath  Past Medical History  OPD on oxygen and prednisone, diastolic heart dysfunction.  Adenocarcinoma of the lung, recent transudate right pleural effusion Significant Hospital Events     Consults: date of consult/date signed off & final recs:  Merry consulted 10/30  Procedures (surgical and bedside):    Significant Diagnostic Tests:   10/30: CT chest:Large masslike area of the right upper lobe which appears somewhat progressed in size there is right hilar mass which is similar to prior exam there is new atelectasis and consolidation of the right middle lobe which may be postobstructive in nature.  Progressive right lower lobe pulmonary nodules concerning for metastatic disease there are small right pleural effusion.  Micro Data:   Antimicrobials:    Subjective:  No acute distress when resting  Objective   Blood pressure (Abnormal) 126/50, pulse (Abnormal) 104, temperature 99.2 F (37.3 C), temperature source Oral, resp. rate (Abnormal) 23, SpO2 100 %.       No intake or output data in the 24 hours ending 02/08/18 1558 There were no vitals filed for this visit.  Examination: General: Frail 72 year old white female currently resting in bed she is in no acute distress HENT: Normocephalic atraumatic no jugular venous distention mucous membranes moist Lungs: Diminished bases no wheezing no accessory use Cardiovascular: Regular rate and rhythm without murmur rub or gallop Abdomen: Soft nontender no organomegally  Extremities: Chronic  lower extremity edema strong pulses Neuro: intact  GU: due to void   Resolved Hospital Problem list     Assessment & Plan:   Acute on chronic respiratory failure suspect primarily decompensated diastolic HF w/ volume overload, pleural effusion and postobstructive atelectasis superimposed on underlying severe COPD, further c/b severe deconditioning -clinically no wheezing to support bronchospasm -no compelling findings to support PNA -CT chest reviewed: RUL mass looks like perhaps bigger in size, Progressive RLL nodule increased R effusion.   Plan Admit to medsurg  DNR status Lasix  Cont home pred 35m/d Scheduled BDs Repeat CXR in am  Consider diagnostic thoracentesis May need SNF  Non-small cell lung cancer currently on immunotherapy (nivolumab) -Could have progression of her cancer Plan Consider thora 154/62  Diastolic HF  Plan Lasix  Daily weight  Tele  Cont home meds  Chronic pain /anxiety  Plan PRN rx   DM Plan ssi    Deconditioning/FTT Plan May need to consider SNF   Disposition / Summary of Today's Plan 02/08/18   Admit to MedSurg/telemetry IV diuresis, reassess chest x-ray in a.m. to consider thoracentesis I think she just likely needs SNF     Diet: NPO Pain/Anxiety/Delirium protocol (if indicated): na VAP protocol (if indicated): na DVT prophylaxis:  heparin  GI prophylaxis: NA Hyperglycemia protocol: SSI Mobility: oob w/ assist  Code Status: DNR  Family Communication: updated   Labs   CBC: Recent Labs  Lab 02/07/18 1108 02/08/18 1342  WBC 12.3* 11.5*  NEUTROABS 10.9* 10.3*  HGB 10.7* 11.0*  HCT 35.2* 35.8*  MCV 96.2 94.7  PLT 280 2703   Basic Metabolic Panel: Recent Labs  Lab  02/07/18 1108 02/08/18 1342  NA 142 139  K 3.8 4.0  CL 102 103  CO2 29 28  GLUCOSE 159* 157*  BUN 15 14  CREATININE 1.14* 1.02*  CALCIUM 9.6 9.2   GFR: Estimated Creatinine Clearance: 51.3 mL/min (A) (by C-G formula based on SCr of 1.02  mg/dL (H)). Recent Labs  Lab 02/07/18 1108 02/08/18 1342 02/08/18 1456  WBC 12.3* 11.5*  --   LATICACIDVEN  --   --  1.92*    Liver Function Tests: Recent Labs  Lab 02/07/18 1108 02/08/18 1342  AST 13* 16  ALT 10 13  ALKPHOS 84 72  BILITOT 0.5 0.7  PROT 6.6 6.4*  ALBUMIN 3.6 3.5   No results for input(s): LIPASE, AMYLASE in the last 168 hours. No results for input(s): AMMONIA in the last 168 hours.  ABG    Component Value Date/Time   TCO2 31 03/08/2016 1717     Coagulation Profile: No results for input(s): INR, PROTIME in the last 168 hours.  Cardiac Enzymes: No results for input(s): CKTOTAL, CKMB, CKMBINDEX, TROPONINI in the last 168 hours.  HbA1C: Hgb A1c MFr Bld  Date/Time Value Ref Range Status  12/10/2017 03:31 AM 5.8 (H) 4.8 - 5.6 % Final    Comment:    (NOTE) Pre diabetes:          5.7%-6.4% Diabetes:              >6.4% Glycemic control for   <7.0% adults with diabetes   07/04/2017 04:45 PM 5.9 4.6 - 6.5 % Final    Comment:    Glycemic Control Guidelines for People with Diabetes:Non Diabetic:  <6%Goal of Therapy: <7%Additional Action Suggested:  >8%     CBG: No results for input(s): GLUCAP in the last 168 hours.  Admitting History of Present Illness.   72 year old white female with history of chronic respiratory failure in the setting of advanced chronic obstructive pulmonary disease followed by our office currently on prednisone, and chronic oxygen.  Just discharged from the hospital approximately 9 days prior to presentation following admission for volume overload plus minus pneumonia.  Completed antibiotics.  States never really felt improved and was short of breath at the time of discharge.  She reports her shortness of breath has progressed since time of discharge.  Denies new sick exposures, denies chest pain, denies nasal discharge, sore throat, sinus congestion.  Does endorse wheezing, worsening shortness of breath, chest pressure and  "suffocating", tolerates very little activity able to ambulate just short distances since the time she was discharged.  Reports intermittent cough, at times productive mostly unable to expectorate.  No significant wheezing on exam but endorses this historically.  Has had increased swelling in her lower extremities, generalized weakness and poor activity tolerance.  Presented from home stating she could not tolerate her symptoms any further.  Review of Systems:   Review of Systems - History obtained from the patient General ROS: positive for  - fatigue, malaise and weight gain negative for - chills, fever, hot flashes or night sweats Psychological ROS: positive for - anxiety ENT ROS: negative Allergy and Immunology ROS: negative Endocrine ROS: negative Respiratory ROS: positive for - cough, shortness of breath and wheezing negative for - hemoptysis, orthopnea, pleuritic pain, sputum changes or wheezing Cardiovascular ROS: no chest pain or dyspnea on exertion Gastrointestinal ROS: no abdominal pain, change in bowel habits, or black or bloody stools Genito-Urinary ROS: no dysuria, trouble voiding, or hematuria Musculoskeletal ROS: negative Neurological ROS: no  TIA or stroke symptoms  Past Medical History  She,  has a past medical history of Adenocarcinoma, lung ([place]), Anemia in neoplastic disease, Anxiety, Arthritis, Asthma, Asymptomatic varicose veins, Atherosclerosis of native arteries of the extremities, unspecified, Atrial fibrillation ([place]), Bronchial pneumonia, 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, Chemotherapy adverse reaction (09/15/11), CHF (congestive heart failure) ([place]), Chronic airway obstruction, not elsewhere classified, Chronic fatigue (08/19/2015), Chronic sinusitis, Complication of anesthesia, COPD with emphysema ([place]), Coronary artery disease, DDD (degenerative disc disease), lumbar, Depressive disorder, not elsewhere classified, Diseases of lips, Disorder of bone and cartilage,  unspecified, DVT (deep venous thrombosis) ([place]), Fibromyalgia, Functional urinary incontinence, GERD (gastroesophageal reflux disease), Hard of hearing, right, Herpes zoster without mention of complication, Hip pain, chronic (09/17/2014), History of blood clots, History of blood transfusion, History of bronchitis, History of echocardiogram, radiation therapy (04/28/11 -06/08/11), Hyperlipidemia, Hypertension, Kidney infection, Lumbago, Malignant neoplasm of bronchus and lung, unspecified site, Mental disorder, Muscle weakness (generalized), Non-small cell lung cancer ([place]) (03/31/2011), Osteoarthrosis, unspecified whether generalized or localized, unspecified site, Osteoporosis, Other abnormal blood chemistry, Other and unspecified hyperlipidemia, PAD (peripheral artery disease) ([place]), Pain in thoracic spine, Polyneuropathy in diabetes(357.2), Primary cancer of right upper lobe of lung ([place]) (02/08/2011), Reflux, Reflux esophagitis, Respiratory failure with hypoxia (01/02/2010), Restless legs syndrome (RLS), Scoliosis, Screening for thyroid disorder, Shortness of breath, Sinus headache, Spasm of muscle, Syncope and collapse, Type II or unspecified type diabetes mellitus with neurological manifestations, uncontrolled(250.62), Type II or unspecified type diabetes mellitus without mention of complication, not stated as uncontrolled, Type II or unspecified type diabetes mellitus without mention of complication, not stated as uncontrolled, Unspecified constipation, Unspecified essential hypertension, Unspecified hearing loss, Unspecified hereditary and idiopathic peripheral neuropathy, and Unspecified vitamin D deficiency.   Surgical History    Past Surgical History:  Procedure Laterality Date  . BRONCHOSCOPY  02/2011  . CARDIAC CATHETERIZATION    . CATARACT EXTRACTION  03/12/2013   Left Eye   . DILATION AND CURETTAGE OF UTERUS  1980's  . EYE SURGERY Left 2014   Cataract  . FETAL BLOOD TRANSFUSION  09/16/11  .  JOINT REPLACEMENT  04/13/10   Left Hip  . LUNG LOBECTOMY  1986   left  . SEPTOPLASTY    . TOTAL HIP ARTHROPLASTY  01/2010   left  . TUBAL LIGATION  1980's     Social History   Social History   Socioeconomic History  . Marital status: Divorced    Spouse name: Not on file  . Number of children: 3  . Years of education: Not on file  . Highest education level: Not on file  Occupational History  . Occupation: disabled  . Occupation: RETIRED    Employer: RETIRED  Social Needs  . Financial resource strain: Not on file  . Food insecurity:    Worry: Not on file    Inability: Not on file  . Transportation needs:    Medical: Not on file    Non-medical: Not on file  Tobacco Use  . Smoking status: Former Smoker    Packs/day: 1.00    Years: 40.00    Pack years: 40.00    Types: Cigarettes    Last attempt to quit: 02/10/1998    Years since quitting: 20.0  . Smokeless tobacco: Never Used  Substance and Sexual Activity  . Alcohol use: No  . Drug use: No  . Sexual activity: Never  Lifestyle  . Physical activity:    Days per week: Not on file    Minutes  per session: Not on file  . Stress: Not on file  Relationships  . Social connections:    Talks on phone: Not on file    Gets together: Not on file    Attends religious service: Not on file    Active member of club or organization: Not on file    Attends meetings of clubs or organizations: Not on file    Relationship status: Not on file  . Intimate partner violence:    Fear of current or ex partner: Not on file    Emotionally abused: Not on file    Physically abused: Not on file    Forced sexual activity: Not on file  Other Topics Concern  . Not on file  Social History Narrative  . Not on file  ,  reports that she quit smoking about 20 years ago. Her smoking use included cigarettes. She has a 40.00 pack-year smoking history. She has never used smokeless tobacco. She reports that she does not drink alcohol or use drugs.    Family History   Her family history includes Arthritis in her daughter; Bone cancer in her father; Cancer in her father; Deep vein thrombosis in her mother; Diabetes in her mother; Heart disease in her mother; Hyperlipidemia in her mother; Hypertension in her mother; Varicose Veins in her mother.   Allergies Allergies  Allergen Reactions  . Iohexol Shortness Of Breath and Other (See Comments)    "burns me up inside"    Pt does fine with 13 hour prep  01/17/12  . Other     CAN'T STAND THE SMELL OF PURE BLEACH; "HAVE TO BACK UP AND POUR AND DON'T BREATH IT TIL i GET CAP BACK ON; DILUTE IT TO SPRAY"  . Anoro Ellipta [Umeclidinium-Vilanterol] Swelling    Swelling of lips and mouth  . Codeine Itching and Nausea And Vomiting    REACTION: GI upset  . Protonix [Pantoprazole Sodium] Diarrhea     Home Medications  Prior to Admission medications   Medication Sig Start Date End Date Taking? Authorizing Provider  albuterol (PROVENTIL HFA;VENTOLIN HFA) 108 (90 Base) MCG/ACT inhaler Inhale 2 puffs into the lungs every 6 (six) hours as needed for wheezing or shortness of breath. 06/01/17  Yes Collene Gobble, MD  albuterol (PROVENTIL) (2.5 MG/3ML) 0.083% nebulizer solution USE 1 VIAL VIA NEBULIZER EVERY 6 HOURS AS NEEDED FOR WHEEZING OR SHORTNESS OF BREATH Patient taking differently: Take 2.5 mg by nebulization every 6 (six) hours as needed for wheezing or shortness of breath.  08/03/17  Yes Byrum, Rose Fillers, MD  ALPRAZolam Duanne Moron) 0.5 MG tablet TAKE 1 TABLET BY MOUTH 3 TIMES A DAY AS NEEDED FOR ANXIETY Patient taking differently: Take 0.5 mg by mouth 3 (three) times daily as needed for anxiety.  01/30/18  Yes Eugenie Filler, MD  aspirin 81 MG tablet Take 81 mg by mouth daily.   Yes [provider]  Calcium Carbonate-Vit D-Min (CALTRATE PLUS PO) Take 1 tablet by mouth every other day.    Yes [provider]  furosemide (LASIX) 40 MG tablet Take 1 tablet (40 mg total) by mouth daily.  01/23/18 02/22/18 Yes Weaver, Scott T, PA-C  Guaifenesin 1200 MG TB12 Take 1 tablet (1,200 mg total) by mouth 2 (two) times daily. 11/30/17  Yes Collene Gobble, MD  HYDROcodone-acetaminophen (NORCO) 10-325 MG tablet Take 1 tablet by mouth 2 (two) times daily as needed. Patient taking differently: Take 1 tablet by mouth every 6 (six) hours as  needed for moderate pain.  01/12/18  Yes Collene Gobble, MD  loratadine (CLARITIN) 10 MG tablet Take 1 tablet (10 mg total) by mouth daily. 01/31/18  Yes Eugenie Filler, MD  metFORMIN (GLUCOPHAGE) 500 MG tablet Take 1 tablet (500 mg total) by mouth 2 (two) times daily. 12/15/17  Yes Hongalgi, Lenis Dickinson, MD  metoprolol tartrate (LOPRESSOR) 25 MG tablet Take 1 tablet (25 mg total) by mouth 2 (two) times daily. 01/23/18  Yes Weaver, Scott T, PA-C  nitroGLYCERIN (NITROSTAT) 0.4 MG SL tablet Place 1 tablet (0.4 mg total) under the tongue every 5 (five) minutes as needed. Patient taking differently: Place 0.4 mg under the tongue every 5 (five) minutes as needed for chest pain.  01/23/18  Yes Weaver, Scott T, PA-C  ondansetron (ZOFRAN) 4 MG tablet Take 1 tablet (4 mg total) by mouth every 8 (eight) hours as needed for nausea or vomiting. 01/16/18  Yes Martyn Ehrich, NP  OXYGEN Inhale 3-4 L into the lungs.    Yes [provider]  pantoprazole (PROTONIX) 40 MG tablet Take 1 tablet (40 mg total) by mouth 2 (two) times daily before a meal. 01/30/18  Yes Eugenie Filler, MD  potassium chloride (K-DUR,KLOR-CON) 10 MEQ tablet Take 1 tablet (10 mEq total) by mouth daily. 01/23/18  Yes Weaver, Scott T, PA-C  predniSONE (DELTASONE) 10 MG tablet Take 1 tablet (10 mg total) by mouth daily with breakfast. 01/13/18  Yes Martyn Ehrich, NP  senna (SENOKOT) 8.6 MG tablet Take 1 tablet by mouth 2 (two) times daily as needed for constipation.    Yes [provider]  simethicone (MYLICON) 80 MG chewable tablet Chew 1 tablet (80 mg total) by mouth 4 (four) times  daily as needed for flatulence. 01/30/18  Yes Eugenie Filler, MD  sodium chloride (OCEAN) 0.65 % SOLN nasal spray Place 1 spray into both nostrils as needed for congestion. 01/30/18  Yes Eugenie Filler, MD  traMADol (ULTRAM) 50 MG tablet TAKE 2 TABLETS (100MG TOTAL) BY MOUTH EVERY 6 HOURS AS NEEDED FOR SEVERE PAIN Patient taking differently: Take 100 mg by mouth every 6 (six) hours as needed for moderate pain.  07/25/17  Yes [name], MD  TRELEGY ELLIPTA 100-62.5-25 MCG/INH AEPB INHALE 1 PUFF BY MOUTH ONCE DAILY Patient taking differently: Inhale 1 puff into the lungs daily.  09/26/17  Yes Collene Gobble, MD  valsartan (DIOVAN) 40 MG tablet Take 1 tablet (40 mg total) by mouth daily. 01/23/18  Yes Weaver, Scott T, PA-C  ACCU-CHEK AVIVA PLUS test strip USE AS DIRECTED TO CHECK BLOOD SUGAR THREE TIMES DAILY 11/14/17   [name], MD  Blood Glucose Monitoring Suppl (ACCU-CHEK AVIVA PLUS) w/Device KIT Use as directed to check blood sugar Dx: E11.9, E11.49 04/21/16   Estill Dooms, MD  Respiratory Therapy Supplies (FLUTTER) DEVI Use a directed 11/08/17   Martyn Ehrich, NP  calcium carbonate 200 MG capsule Take 250 mg by mouth daily. Does not take everyday  01/23/18  [provider]     Critical care time: na     Erick Colace ACNP-BC [place] Pager # [phone] OR # [phone] if no answer

## 2018-02-08 NOTE — ED Notes (Signed)
Unable to locate Dr. about venous blood gas. Result given to RN [facility]

## 2018-02-08 NOTE — ED Notes (Addendum)
Pt given turkey sandwich and water

## 2018-02-08 NOTE — ED Notes (Signed)
Got patient into a gown on the monitor patient is resting with call bell in reach

## 2018-02-08 NOTE — ED Provider Notes (Signed)
[facility] EMERGENCY DEPARTMENT Provider Note   CSN: [id number] Arrival date & time: 02/08/18  1314     History   Chief Complaint Chief Complaint  Patient presents with  . Shortness of Breath    HPI [name] is a 72 y.o. female.  HPI  72 year old female history of lung cancer, COPD, recent admission for community-acquired pneumonia discharged 1021 presents today with increasing cough, dyspnea, and tachycardia.  She reports that she has been at home living with her daughter.  She is on oxygen at baseline.  Her activity is limited by dyspnea.  Had decreased appetite but has been eating and drinking.  Has completed her antibiotics.  She states that when her aide came in today she told her that her heart rate was 125.  Subsequently, EMS was called for transport.  She denies pain.  She endorses that she has had continued productive cough.  She states that she now feels like "it" is on the right side.  She states that she was seen in her oncology office yesterday and had a CT scan and labs.   Past Medical History:  Diagnosis Date  . Adenocarcinoma, lung ([place])    upper left lobe  . Anemia in neoplastic disease   . Anxiety   . Arthritis    "all over my body"  . Asthma   . Asymptomatic varicose veins   . Atherosclerosis of native arteries of the extremities, unspecified   . Atrial fibrillation ([place])   . Bronchial pneumonia    history  . 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 Chemotherapy adverse reaction 09/15/11   "makes me light headed and pass out; this is 3rd blood transfusion since going thru it"  . CHF (congestive heart failure) ([place])   . Chronic airway obstruction, not elsewhere classified   . Chronic fatigue 08/19/2015  . Chronic sinusitis   . Complication of anesthesia    slow to wake up  . COPD with emphysema ([place])   . Coronary artery disease    Dr. Burt Knack had stress test done  . DDD (degenerative disc disease), lumbar   . Depressive  disorder, not elsewhere classified   . Diseases of lips   . Disorder of bone and cartilage, unspecified   . DVT (deep venous thrombosis) ([place])   . Fibromyalgia   . Functional urinary incontinence   . GERD (gastroesophageal reflux disease)   . Hard of hearing, right   . Herpes zoster without mention of complication   . Hip pain, chronic 09/17/2014  . History of blood clots    "2 in my aorta"  . History of blood transfusion   . History of bronchitis   . History of echocardiogram    Echo 11/18: mild LVH, EF 60-65, no RWMA, Gr 1 DD, Ao Sclerosis, mild AI, RVH with low nl RSVF, trivial eff  . Hx of radiation therapy 04/28/11 -06/08/11   right upper lobe-lung  . Hyperlipidemia   . Hypertension   . Kidney infection    "related to chemo"  . Lumbago   . Malignant neoplasm of bronchus and lung, unspecified site   . Mental disorder   . Muscle weakness (generalized)   . Non-small cell lung cancer ([place]) 03/31/2011   Adenocarcinoma Rt upper lobe mass  . Osteoarthrosis, unspecified whether generalized or localized, unspecified site   . Osteoporosis   . Other abnormal blood chemistry   . Other and unspecified hyperlipidemia   . PAD (peripheral  artery disease) ([place])   . Pain in thoracic spine   . 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 Primary cancer of right upper lobe of lung ([place]) 02/08/2011   Biopsy Rt upper lobe mass 03/31/11>>adenocarcinoma DIAGNOSIS: Stage IIB/IIIA non-small cell lung cancer consistent with adenocarcinoma diagnosed in December 2012.   PRIOR THERAPY:  1) Status post radiation therapy to the right upper lobe lung mass completed on 06/04/2011 under the care of Dr. Pablo Ledger.  2) Systemic chemotherapy with carboplatin for AUC of 5 and Alimta 500 mg/M2 giving every 3   . Reflux   . Reflux esophagitis   . Respiratory failure with hypoxia 01/02/2010  . Restless legs syndrome (RLS)   . Scoliosis   . Screening for thyroid disorder   . Shortness of breath    "anytime really"  .  Sinus headache    "all the time"  . Spasm of muscle   . Syncope and collapse   . Type II or unspecified type diabetes mellitus with neurological manifestations, uncontrolled(250.62)   . Type II or unspecified type diabetes mellitus without mention of complication, not stated as uncontrolled   . Type II or unspecified type diabetes mellitus without mention of complication, not stated as uncontrolled   . Unspecified constipation   . Unspecified essential hypertension   . Unspecified hearing loss   . Unspecified hereditary and idiopathic peripheral neuropathy   . Unspecified vitamin D deficiency     Patient Active Problem List   Diagnosis Date Noted  . HCAP (healthcare-associated pneumonia)   . Hypomagnesemia 01/28/2018  . Fever of unknown origin 01/10/2018  . Pleural effusion 12/09/2017  . Chest pain 12/09/2017  . 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 Nasal sore 10/25/2017  . COPD with acute exacerbation ([place]) 10/25/2017  . Goals of care, counseling/discussion 09/01/2017  . Encounter for antineoplastic immunotherapy 09/01/2017  . Community acquired pneumonia 08/10/2017  . Urinary frequency 07/04/2017  . Chronic pain 07/04/2017  . Port-A-Cath in place 05/05/2017  . Memory changes 02/14/2017  . Constipation 02/14/2017  . CAD (coronary artery disease) 11/30/2016  . Thrush 07/21/2016  . Benign paroxysmal positional vertigo 01/20/2016  . Palliative care encounter 08/19/2015  . Chronic fatigue 08/19/2015  . Anxiety and depression 07/29/2015  . Headache, tension type, chronic 09/17/2014  . Hip pain, chronic 09/17/2014  . History of fall 05/21/2014  . Insomnia 01/09/2014  . Gait abnormality 08/16/2013  . 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 Oropharyngeal dysphagia 08/01/2013  . PUD (peptic ulcer disease) 08/01/2013  . Myalgia and myositis 08/01/2013  . Esophageal reflux 06/27/2013  . Atherosclerosis of native arteries of the extremities with  intermittent claudication 10/26/2012  . 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 Restless legs syndrome (RLS)   . GERD (gastroesophageal reflux disease)   . Heart murmur   . History of blood clots   . Syncope and collapse   . History of blood transfusion   . Fibromyalgia   . DDD (degenerative disc disease), lumbar   . Scoliosis   . Mental disorder   . Chronic diastolic heart failure ([place])   . Hx of radiation therapy   . Back pain 11/26/2011  . Chronic cough 11/26/2011  . SOB (shortness of breath) 09/15/2011  . Weakness generalized 09/15/2011  . Anemia 09/15/2011  . Hypokalemia 08/13/2011  . Primary cancer of right upper lobe of lung ([place]) 02/08/2011  . Respiratory failure with hypoxia ([place]) 01/02/2010  . PAD (peripheral artery disease) ([place]) 08/06/2008  .  Dyslipidemia 10/28/2006  . Essential hypertension 10/28/2006  . Sinusitis, chronic 10/28/2006  . COPD (chronic obstructive pulmonary disease) ([place]) 10/28/2006    Past Surgical History:  Procedure Laterality Date  . BRONCHOSCOPY  02/2011  . CARDIAC CATHETERIZATION    . CATARACT EXTRACTION  03/12/2013   Left Eye   . DILATION AND CURETTAGE OF UTERUS  1980's  . EYE SURGERY Left 2014   Cataract  . FETAL BLOOD TRANSFUSION  09/16/11  . JOINT REPLACEMENT  04/13/10   Left Hip  . LUNG LOBECTOMY  1986   left  . SEPTOPLASTY    . TOTAL HIP ARTHROPLASTY  01/2010   left  . TUBAL LIGATION  1980's     OB History   None      Home Medications    Prior to Admission medications   Medication Sig Start Date End Date Taking? Authorizing Provider  ACCU-CHEK AVIVA PLUS test strip USE AS DIRECTED TO CHECK BLOOD SUGAR THREE TIMES DAILY 11/14/17   [name], MD  albuterol (PROVENTIL HFA;VENTOLIN HFA) 108 (90 Base) MCG/ACT inhaler Inhale 2 puffs into the lungs every 6 (six) hours as needed for wheezing or shortness of breath. 06/01/17   Collene Gobble, MD  albuterol (PROVENTIL) (2.5 MG/3ML) 0.083% nebulizer solution USE 1 VIAL VIA NEBULIZER  EVERY 6 HOURS AS NEEDED FOR WHEEZING OR SHORTNESS OF BREATH Patient taking differently: Take 2.5 mg by nebulization every 6 (six) hours as needed for wheezing or shortness of breath.  08/03/17   Collene Gobble, MD  ALPRAZolam Duanne Moron) 0.5 MG tablet TAKE 1 TABLET BY MOUTH 3 TIMES A DAY AS NEEDED FOR ANXIETY 01/30/18   Eugenie Filler, MD  aspirin 81 MG tablet Take 81 mg by mouth daily.    [provider]  Blood Glucose Monitoring Suppl (ACCU-CHEK AVIVA PLUS) w/Device KIT Use as directed to check blood sugar Dx: E11.9, E11.49 04/21/16   Estill Dooms, MD  Calcium Carbonate-Vit D-Min (CALTRATE PLUS PO) Take 1 tablet by mouth every other day.     [provider]  furosemide (LASIX) 40 MG tablet Take 1 tablet (40 mg total) by mouth daily. 01/23/18 02/22/18  Richardson Dopp T, PA-C  Guaifenesin 1200 MG TB12 Take 1 tablet (1,200 mg total) by mouth 2 (two) times daily. 11/30/17   Collene Gobble, MD  HYDROcodone-acetaminophen (NORCO) 10-325 MG tablet Take 1 tablet by mouth 2 (two) times daily as needed. Patient taking differently: Take 1 tablet by mouth every 6 (six) hours as needed for moderate pain or severe pain.  01/12/18   Collene Gobble, MD  loratadine (CLARITIN) 10 MG tablet Take 1 tablet (10 mg total) by mouth daily. 01/31/18   Eugenie Filler, MD  metFORMIN (GLUCOPHAGE) 500 MG tablet Take 1 tablet (500 mg total) by mouth 2 (two) times daily. 12/15/17   Hongalgi, Lenis Dickinson, MD  metoprolol tartrate (LOPRESSOR) 25 MG tablet Take 1 tablet (25 mg total) by mouth 2 (two) times daily. 01/23/18   Richardson Dopp T, PA-C  nitroGLYCERIN (NITROSTAT) 0.4 MG SL tablet Place 1 tablet (0.4 mg total) under the tongue every 5 (five) minutes as needed. Patient taking differently: Place 0.4 mg under the tongue every 5 (five) minutes as needed for chest pain.  01/23/18   Richardson Dopp T, PA-C  ondansetron (ZOFRAN) 4 MG tablet Take 1 tablet (4 mg total) by mouth every 8 (eight) hours as needed for nausea  or vomiting. 01/16/18   Martyn Ehrich, NP  OXYGEN Inhale 4 L into the lungs.    [provider]  pantoprazole (PROTONIX) 40 MG tablet Take 1 tablet (40 mg total) by mouth 2 (two) times daily before a meal. 01/30/18   Eugenie Filler, MD  potassium chloride (K-DUR,KLOR-CON) 10 MEQ tablet Take 1 tablet (10 mEq total) by mouth daily. 01/23/18   Richardson Dopp T, PA-C  predniSONE (DELTASONE) 10 MG tablet Take 1 tablet (10 mg total) by mouth daily with breakfast. 01/13/18   Martyn Ehrich, NP  Respiratory Therapy Supplies (FLUTTER) DEVI Use a directed 11/08/17   Martyn Ehrich, NP  senna (SENOKOT) 8.6 MG tablet Take 1 tablet by mouth 2 (two) times daily as needed for constipation.     [provider]  simethicone (MYLICON) 80 MG chewable tablet Chew 1 tablet (80 mg total) by mouth 4 (four) times daily as needed for flatulence. 01/30/18   Eugenie Filler, MD  sodium chloride (OCEAN) 0.65 % SOLN nasal spray Place 1 spray into both nostrils as needed for congestion. 01/30/18   Eugenie Filler, MD  traMADol (ULTRAM) 50 MG tablet TAKE 2 TABLETS (100MG TOTAL) BY MOUTH EVERY 6 HOURS AS NEEDED FOR SEVERE PAIN Patient taking differently: Take 100 mg by mouth every 6 (six) hours as needed for moderate pain or severe pain.  07/25/17   [name], MD  TRELEGY ELLIPTA 100-62.5-25 MCG/INH AEPB INHALE 1 PUFF BY MOUTH ONCE DAILY Patient taking differently: Inhale 1 puff into the lungs daily.  09/26/17   Collene Gobble, MD  valsartan (DIOVAN) 40 MG tablet Take 1 tablet (40 mg total) by mouth daily. 01/23/18   Richardson Dopp T, PA-C  calcium carbonate 200 MG capsule Take 250 mg by mouth daily. Does not take everyday  01/23/18  [provider]    Family History Family History  Problem Relation Age of Onset  . Bone cancer Father   . Cancer Father        Bone  . Diabetes Mother   . Deep vein thrombosis Mother   . Heart disease Mother        Heart Disease before age 81-   PVD  . Hyperlipidemia Mother   . Hypertension Mother   . Varicose Veins Mother   . Arthritis Daughter     Social History Social History   Tobacco Use  . Smoking status: Former Smoker    Packs/day: 1.00    Years: 40.00    Pack years: 40.00    Types: Cigarettes    Last attempt to quit: 02/10/1998    Years since quitting: 20.0  . Smokeless tobacco: Never Used  Substance Use Topics  . Alcohol use: No  . Drug use: No     Allergies   Iohexol; Other; Anoro ellipta [umeclidinium-vilanterol]; Codeine; and Protonix [pantoprazole sodium]   Review of Systems Review of Systems   Physical Exam Updated Vital Signs BP (!) 119/54   Pulse (!) 107   Temp 99.2 F (37.3 C) (Oral)   Resp (!) 31   SpO2 98%   Physical Exam  Constitutional: She is oriented to person, place, and time.  Chronically ill appearing female mild distress  HENT:  Head: Normocephalic and atraumatic.  Eyes: Pupils are equal, round, and reactive to light. EOM are normal.  Neck: Normal range of motion.  Cardiovascular: Regular rhythm. Tachycardia present.  Pulmonary/Chest: Effort normal. She has decreased breath sounds. She has no wheezes. She has no rhonchi. She has no rales.  Abdominal:  Soft. Bowel sounds are normal.  Musculoskeletal: Normal range of motion.  Neurological: She is alert and oriented to person, place, and time.  Skin: Skin is warm. Capillary refill takes less than 2 seconds.  Psychiatric: She has a normal mood and affect. Her behavior is normal.  Nursing note and vitals reviewed.    ED Treatments / Results  Labs (all labs ordered are listed, but only abnormal results are displayed) Labs Reviewed  CULTURE, BLOOD (ROUTINE X 2)  CULTURE, BLOOD (ROUTINE X 2)  COMPREHENSIVE METABOLIC PANEL  CBC WITH DIFFERENTIAL/PLATELET  URINALYSIS, ROUTINE W REFLEX MICROSCOPIC  PROTIME-INR  I-STAT CG4 LACTIC ACID, ED    EKG EKG Interpretation  Date/Time:  Wednesday February 08 2018 13:17:47  EDT Ventricular Rate:  112 PR Interval:  160 QRS Duration: 52 QT Interval:  308 QTC Calculation: 420 R Axis:   23 Text Interpretation:  Sinus tachycardia Otherwise normal ECG T wave inversion v1 and v2 Confirmed by Pattricia Boss [phone]) on 02/08/2018 4:06:13 PM   Radiology Ct Chest Wo Contrast  Result Date: 02/07/2018 CLINICAL DATA:  Followup non-small cell lung cancer. EXAM: CT CHEST WITHOUT CONTRAST TECHNIQUE: Multidetector CT imaging of the chest was performed following the standard protocol without IV contrast. COMPARISON:  11/03/2017 FINDINGS: Cardiovascular: The heart size appears within normal limits. No pericardial effusion. Aortic atherosclerosis. Calcification within the LAD, left circumflex coronary arteries noted. Mediastinum/Nodes: Normal appearance of the thyroid gland. The trachea appears patent and is midline. Normal appearance of the esophagus. No enlarged axillary or supraclavicular lymph nodes. No mediastinal adenopathy identified. Within the limitations of unenhanced technique the previous right hilar mass measures 3.1 cm, image 59/2. Previously 3.0 cm. Lungs/Pleura: The lungs are emphysematous. There is a moderate partially loculated right pleural effusion which has increased in volume from the previous examination. Within the right upper lobe there is a large area of progressive masslike architectural distortion measuring 5.6 x 8.4 by 5.0 cm, image 66/2. Previously this appeared less solid measuring 7.5 by 5.2 by 3.9 cm. Surrounding ground-glass attenuation is noted which appears progressive from previous exam. There is new complete postobstructive atelectasis of the right middle lobe, image 81/5. Multiple new or enlarging nodules are identified within the right lower lobe. The largest nodule measures 1.8 cm, image 120/5. New from previous exam. Also new from previous exam is a 1.6 cm right lower lobe lung nodule, image 134/5. There are mild patchy areas of ground-glass  attenuation within the left lung. Upper Abdomen: No acute abnormality. Gallstone again noted. Small bilateral low-attenuation adrenal nodules are favored to represent adenomas. Unchanged. Musculoskeletal: Multiple chronic right posterior rib deformities are identified. Rib right humeral head unchanged compression deformities within the upper thoracic spine. Avascular necrosis. IMPRESSION: 1. Large masslike area of architectural distortion within the right upper lobe appears is increased in size and increasingly solid worrisome for tumor progression. 2. The right hilar mass is similar in size to previous exam but there is near new complete atelectasis and consolidation of the right middle lobe. 3. Progressive right lower lobe pulmonary nodule concerning for metastatic disease. 4. Increase in volume of right pleural effusion. Consider further evaluation with diagnostic thoracentesis if there is a concern for malignant effusion. 5. Aortic Atherosclerosis (ICD10-I70.0) and Emphysema (ICD10-J43.9). 6. Multi vessel coronary artery atherosclerotic calcifications. Electronically Signed   By: Kerby Moors M.D.   On: 02/07/2018 16:42    Procedures Procedures (including critical care time)  Medications Ordered in ED Medications - No data to display   Initial  Impression / Assessment and Plan / ED Course  I have reviewed the triage vital signs and the nursing notes.  Pertinent labs & imaging results that were available during my care of the patient were reviewed by me and considered in my medical decision making (see chart for details). 72 yo female with lung ca, copd , recent admission for cap presents today with tachycardia and worsening dyspnea. Pulmonary consulted to assist with evaluation re admission vs op tx.   CT obtained yesteray with increased right pleural effusion increeasing right upper lobe mass     3:27 PM Discussed with Dr. Valeta Harms  Pulmonary team seeing patient and plan diuresis- state  they will right orders for lasix and abx D/W hospital for readmission 4:22 PM Critical care call back to inform they are not starting antibiotics today pending further testing tomorrow  Discussed with Dr. Jamse Arn and she will see for admission for hospitalist Final Clinical Impressions(s) / ED Diagnoses   Final diagnoses:  Pleural effusion  SOB (shortness of breath)  Tachycardia    ED Discharge Orders    None       Pattricia Boss, MD 02/08/18 1623

## 2018-02-08 NOTE — H&P (Signed)
History and Physical:    [name]   UDJ:[id number] DOB: 1945-08-18 DOA: 02/08/2018  Referring MD/provider: Dr Jeanell Sparrow PCP: [name], DO   Patient coming from: Home  Chief Complaint: "I don't feel any better and I should".  History of Present Illness:   [name] is an 72 y.o. female  with past medical history significant for chronic hypoxic respiratory failure secondary to advanced COPD and complicated bynon-small cell lung cancer on immunotherapy, diabetes mellitus, atrial fibrillation, diastolic heart failure  was recently discharged from [facility] last week after treatment for presumed community-acquired pneumonia. Patient now returns to Hospital stating that she doesn't feel any better than when she was initially admitted although admits that she does not feel any worse since discharge however does feel that she should be feeling better than she is feeling. Patient notes that she is unable to walk around her house due to severe shortness of breath. She is also short of breath at rest and has to have oxygen in place continuously. Patient denies any new fevers or chills although states that she has a low-grade fever at 99 continuously. Patient states she also has a continuous cough and lots of secretions which have been chronic. Patient denies any change in her cough. Patient admits to ongoing orthopnea and PND which are long-standing. Patient admits to intermittent chest pain which she's also had for a while but no new substernal chest pressure or signs or symptoms of cardiac ischemia.  ED Course:  The patient was seen by pulmonary consultation in the emergency room who recommended admission for diuresis as well as possible pleural tap in the morning to be done by them. They have written orders for Lasix. They also note that the right upper lobe mass looks perhaps somewhat bigger and may well have progression of her cancer.  ROS:   ROS   Review of  Systems: General:admits to low-grade fever. Skin: No rashes, lesions, wounds Eyes: no discharge, redness, pain HENT: no ear pain, hearing loss, drainage, tinnitus Endocrine: no heat/cold intolerance, no polyuria Respiratory: Admits to chronic shortness of breath Cardiovascular: No palpitations, GI: No nausea, vomiting, But admits to persistent diarrhea without change  Past Medical History:   Past Medical History:  Diagnosis Date  . Adenocarcinoma, lung ([place])    upper left lobe  . Anemia in neoplastic disease   . Anxiety   . Arthritis    "all over my body"  . Asthma   . Asymptomatic varicose veins   . Atherosclerosis of native arteries of the extremities, unspecified   . Atrial fibrillation ([place])   . Bronchial pneumonia    history  . 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 Chemotherapy adverse reaction 09/15/11   "makes me light headed and pass out; this is 3rd blood transfusion since going thru it"  . CHF (congestive heart failure) ([place])   . Chronic airway obstruction, not elsewhere classified   . Chronic fatigue 08/19/2015  . Chronic sinusitis   . Complication of anesthesia    slow to wake up  . COPD with emphysema ([place])   . Coronary artery disease    Dr. Burt Knack had stress test done  . DDD (degenerative disc disease), lumbar   . Depressive disorder, not elsewhere classified   . Diseases of lips   . Disorder of bone and cartilage, unspecified   . DVT (deep venous thrombosis) ([place])   . Fibromyalgia   . Functional urinary incontinence   .  GERD (gastroesophageal reflux disease)   . Hard of hearing, right   . Herpes zoster without mention of complication   . Hip pain, chronic 09/17/2014  . History of blood clots    "2 in my aorta"  . History of blood transfusion   . History of bronchitis   . History of echocardiogram    Echo 11/18: mild LVH, EF 60-65, no RWMA, Gr 1 DD, Ao Sclerosis, mild AI, RVH with low nl RSVF, trivial eff  . Hx of radiation therapy 04/28/11 -06/08/11    right upper lobe-lung  . Hyperlipidemia   . Hypertension   . Kidney infection    "related to chemo"  . Lumbago   . Malignant neoplasm of bronchus and lung, unspecified site   . Mental disorder   . Muscle weakness (generalized)   . Non-small cell lung cancer ([place]) 03/31/2011   Adenocarcinoma Rt upper lobe mass  . Osteoarthrosis, unspecified whether generalized or localized, unspecified site   . Osteoporosis   . Other abnormal blood chemistry   . Other and unspecified hyperlipidemia   . PAD (peripheral artery disease) ([place])   . Pain in thoracic spine   . 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 Primary cancer of right upper lobe of lung ([place]) 02/08/2011   Biopsy Rt upper lobe mass 03/31/11>>adenocarcinoma DIAGNOSIS: Stage IIB/IIIA non-small cell lung cancer consistent with adenocarcinoma diagnosed in December 2012.   PRIOR THERAPY:  1) Status post radiation therapy to the right upper lobe lung mass completed on 06/04/2011 under the care of Dr. Pablo Ledger.  2) Systemic chemotherapy with carboplatin for AUC of 5 and Alimta 500 mg/M2 giving every 3   . Reflux   . Reflux esophagitis   . Respiratory failure with hypoxia 01/02/2010  . Restless legs syndrome (RLS)   . Scoliosis   . Screening for thyroid disorder   . Shortness of breath    "anytime really"  . Sinus headache    "all the time"  . Spasm of muscle   . Syncope and collapse   . Type II or unspecified type diabetes mellitus with neurological manifestations, uncontrolled(250.62)   . Type II or unspecified type diabetes mellitus without mention of complication, not stated as uncontrolled   . Type II or unspecified type diabetes mellitus without mention of complication, not stated as uncontrolled   . Unspecified constipation   . Unspecified essential hypertension   . Unspecified hearing loss   . Unspecified hereditary and idiopathic peripheral neuropathy   . Unspecified vitamin D deficiency     Past Surgical History:   Past  Surgical History:  Procedure Laterality Date  . BRONCHOSCOPY  02/2011  . CARDIAC CATHETERIZATION    . CATARACT EXTRACTION  03/12/2013   Left Eye   . DILATION AND CURETTAGE OF UTERUS  1980's  . EYE SURGERY Left 2014   Cataract  . FETAL BLOOD TRANSFUSION  09/16/11  . JOINT REPLACEMENT  04/13/10   Left Hip  . LUNG LOBECTOMY  1986   left  . SEPTOPLASTY    . TOTAL HIP ARTHROPLASTY  01/2010   left  . TUBAL LIGATION  1980's    Social History:   Social History   Socioeconomic History  . Marital status: Divorced    Spouse name: Not on file  . Number of children: 3  . Years of education: Not on file  . Highest education level: Not on file  Occupational History  . Occupation: disabled  . Occupation: RETIRED    Employer: RETIRED  Social Needs  . Financial resource strain: Not on file  . Food insecurity:    Worry: Not on file    Inability: Not on file  . Transportation needs:    Medical: Not on file    Non-medical: Not on file  Tobacco Use  . Smoking status: Former Smoker    Packs/day: 1.00    Years: 40.00    Pack years: 40.00    Types: Cigarettes    Last attempt to quit: 02/10/1998    Years since quitting: 20.0  . Smokeless tobacco: Never Used  Substance and Sexual Activity  . Alcohol use: No  . Drug use: No  . Sexual activity: Never  Lifestyle  . Physical activity:    Days per week: Not on file    Minutes per session: Not on file  . Stress: Not on file  Relationships  . Social connections:    Talks on phone: Not on file    Gets together: Not on file    Attends religious service: Not on file    Active member of club or organization: Not on file    Attends meetings of clubs or organizations: Not on file    Relationship status: Not on file  . Intimate partner violence:    Fear of current or ex partner: Not on file    Emotionally abused: Not on file    Physically abused: Not on file    Forced sexual activity: Not on file  Other Topics Concern  . Not on file   Social History Narrative  . Not on file    Allergies   Iohexol; Other; Anoro ellipta [umeclidinium-vilanterol]; Codeine; and Protonix [pantoprazole sodium]  Family history:   Family History  Problem Relation Age of Onset  . Bone cancer Father   . Cancer Father        Bone  . Diabetes Mother   . Deep vein thrombosis Mother   . Heart disease Mother        Heart Disease before age 80-  PVD  . Hyperlipidemia Mother   . Hypertension Mother   . Varicose Veins Mother   . Arthritis Daughter     Current Medications:   Prior to Admission medications   Medication Sig Start Date End Date Taking? Authorizing Provider  albuterol (PROVENTIL HFA;VENTOLIN HFA) 108 (90 Base) MCG/ACT inhaler Inhale 2 puffs into the lungs every 6 (six) hours as needed for wheezing or shortness of breath. 06/01/17  Yes Collene Gobble, MD  albuterol (PROVENTIL) (2.5 MG/3ML) 0.083% nebulizer solution USE 1 VIAL VIA NEBULIZER EVERY 6 HOURS AS NEEDED FOR WHEEZING OR SHORTNESS OF BREATH Patient taking differently: Take 2.5 mg by nebulization every 6 (six) hours as needed for wheezing or shortness of breath.  08/03/17  Yes Byrum, Rose Fillers, MD  ALPRAZolam Duanne Moron) 0.5 MG tablet TAKE 1 TABLET BY MOUTH 3 TIMES A DAY AS NEEDED FOR ANXIETY Patient taking differently: Take 0.5 mg by mouth 3 (three) times daily as needed for anxiety.  01/30/18  Yes Eugenie Filler, MD  aspirin 81 MG tablet Take 81 mg by mouth daily.   Yes [provider]  Calcium Carbonate-Vit D-Min (CALTRATE PLUS PO) Take 1 tablet by mouth every other day.    Yes [provider]  furosemide (LASIX) 40 MG tablet Take 1 tablet (40 mg total) by mouth daily. 01/23/18 02/22/18 Yes Weaver, Scott T, PA-C  Guaifenesin 1200 MG TB12 Take 1 tablet (1,200 mg total) by mouth 2 (two) times  daily. 11/30/17  Yes Collene Gobble, MD  HYDROcodone-acetaminophen (NORCO) 10-325 MG tablet Take 1 tablet by mouth 2 (two) times daily as needed. Patient taking  differently: Take 1 tablet by mouth every 6 (six) hours as needed for moderate pain.  01/12/18  Yes Collene Gobble, MD  loratadine (CLARITIN) 10 MG tablet Take 1 tablet (10 mg total) by mouth daily. 01/31/18  Yes Eugenie Filler, MD  metFORMIN (GLUCOPHAGE) 500 MG tablet Take 1 tablet (500 mg total) by mouth 2 (two) times daily. 12/15/17  Yes Hongalgi, Lenis Dickinson, MD  metoprolol tartrate (LOPRESSOR) 25 MG tablet Take 1 tablet (25 mg total) by mouth 2 (two) times daily. 01/23/18  Yes Weaver, Scott T, PA-C  nitroGLYCERIN (NITROSTAT) 0.4 MG SL tablet Place 1 tablet (0.4 mg total) under the tongue every 5 (five) minutes as needed. Patient taking differently: Place 0.4 mg under the tongue every 5 (five) minutes as needed for chest pain.  01/23/18  Yes Weaver, Scott T, PA-C  ondansetron (ZOFRAN) 4 MG tablet Take 1 tablet (4 mg total) by mouth every 8 (eight) hours as needed for nausea or vomiting. 01/16/18  Yes Martyn Ehrich, NP  OXYGEN Inhale 3-4 L into the lungs.    Yes [provider]  pantoprazole (PROTONIX) 40 MG tablet Take 1 tablet (40 mg total) by mouth 2 (two) times daily before a meal. 01/30/18  Yes Eugenie Filler, MD  potassium chloride (K-DUR,KLOR-CON) 10 MEQ tablet Take 1 tablet (10 mEq total) by mouth daily. 01/23/18  Yes Weaver, Scott T, PA-C  predniSONE (DELTASONE) 10 MG tablet Take 1 tablet (10 mg total) by mouth daily with breakfast. 01/13/18  Yes Martyn Ehrich, NP  senna (SENOKOT) 8.6 MG tablet Take 1 tablet by mouth 2 (two) times daily as needed for constipation.    Yes [provider]  simethicone (MYLICON) 80 MG chewable tablet Chew 1 tablet (80 mg total) by mouth 4 (four) times daily as needed for flatulence. 01/30/18  Yes Eugenie Filler, MD  sodium chloride (OCEAN) 0.65 % SOLN nasal spray Place 1 spray into both nostrils as needed for congestion. 01/30/18  Yes Eugenie Filler, MD  traMADol (ULTRAM) 50 MG tablet TAKE 2 TABLETS (100MG TOTAL) BY MOUTH  EVERY 6 HOURS AS NEEDED FOR SEVERE PAIN Patient taking differently: Take 100 mg by mouth every 6 (six) hours as needed for moderate pain.  07/25/17  Yes [name], MD  TRELEGY ELLIPTA 100-62.5-25 MCG/INH AEPB INHALE 1 PUFF BY MOUTH ONCE DAILY Patient taking differently: Inhale 1 puff into the lungs daily.  09/26/17  Yes Collene Gobble, MD  valsartan (DIOVAN) 40 MG tablet Take 1 tablet (40 mg total) by mouth daily. 01/23/18  Yes Weaver, Scott T, PA-C  ACCU-CHEK AVIVA PLUS test strip USE AS DIRECTED TO CHECK BLOOD SUGAR THREE TIMES DAILY 11/14/17   [name], MD  Blood Glucose Monitoring Suppl (ACCU-CHEK AVIVA PLUS) w/Device KIT Use as directed to check blood sugar Dx: E11.9, E11.49 04/21/16   Estill Dooms, MD  Respiratory Therapy Supplies (FLUTTER) DEVI Use a directed 11/08/17   Martyn Ehrich, NP  calcium carbonate 200 MG capsule Take 250 mg by mouth daily. Does not take everyday  01/23/18  [provider]    Physical Exam:   Vitals:   02/08/18 1326 02/08/18 1403 02/08/18 1500 02/08/18 1701  BP: 133/60 (!) 119/54 (!) 126/50 (!) 146/63  Pulse: (!) 112 (!) 107 (!) 104 (!) 113  Resp: 16 (!) 31 (!) 23 (!) 23  Temp: 99.2 F (37.3 C)     TempSrc: Oral     SpO2: 99% 98% 100% 100%     Physical Exam: Blood pressure (!) 146/63, pulse (!) 113, temperature 99.2 F (37.3 C), temperature source Oral, resp. rate (!) 23, SpO2 100 %. Gen: remarkably well-appearing female in good spirits sitting up in bed with oxygen in place using suction to manage secretions. Patient is able to speak in full sentences without difficulty. Eyes: Sclerae anicteric. Conjunctiva mildly injected. Neck: Supple, no jugular venous distention. Chest: patient is bronchial breath sounds in her upper lung fields area bilaterally left greater than right. She has decreased breath sounds in her right lower lung field CV: Distant, regular, no audible murmurs. Abdomen: NABS, soft, nondistended, nontender. mild  tenderness to  deep palpationdiffusely.. No rebound, no guarding. Extremities: 1+ edema bilaterally. Skin: she has candidal rash in groin area. Neuro: Alert and oriented times 3; grossly nonfocal. Psych: Patient is cooperative, logical and coherent with appropriate mood and affect.  Data Review:    Labs: Basic Metabolic Panel: Recent Labs  Lab 02/07/18 1108 02/08/18 1342  NA 142 139  K 3.8 4.0  CL 102 103  CO2 29 28  GLUCOSE 159* 157*  BUN 15 14  CREATININE 1.14* 1.02*  CALCIUM 9.6 9.2   Liver Function Tests: Recent Labs  Lab 02/07/18 1108 02/08/18 1342  AST 13* 16  ALT 10 13  ALKPHOS 84 72  BILITOT 0.5 0.7  PROT 6.6 6.4*  ALBUMIN 3.6 3.5   No results for input(s): LIPASE, AMYLASE in the last 168 hours. No results for input(s): AMMONIA in the last 168 hours. CBC: Recent Labs  Lab 02/07/18 1108 02/08/18 1342  WBC 12.3* 11.5*  NEUTROABS 10.9* 10.3*  HGB 10.7* 11.0*  HCT 35.2* 35.8*  MCV 96.2 94.7  PLT 280 282   Cardiac Enzymes: No results for input(s): CKTOTAL, CKMB, CKMBINDEX, TROPONINI in the last 168 hours.  BNP (last 3 results) Recent Labs    01/10/18 1146 01/23/18 1406  PROBNP 27.0 199   CBG: No results for input(s): GLUCAP in the last 168 hours.  Urinalysis    Component Value Date/Time   COLORURINE STRAW (A) 02/08/2018 1625   APPEARANCEUR CLEAR 02/08/2018 1625   LABSPEC 1.006 02/08/2018 1625   LABSPEC 1.020 06/18/2011 1547   PHURINE 6.0 02/08/2018 1625   GLUCOSEU NEGATIVE 02/08/2018 1625   GLUCOSEU NEGATIVE 01/13/2018 1553   HGBUR SMALL (A) 02/08/2018 1625   BILIRUBINUR NEGATIVE 02/08/2018 1625   BILIRUBINUR Neg 12/04/2013 1643   BILIRUBINUR Negative 06/18/2011 1547   KETONESUR NEGATIVE 02/08/2018 1625   PROTEINUR NEGATIVE 02/08/2018 1625   UROBILINOGEN 0.2 01/13/2018 1553   NITRITE NEGATIVE 02/08/2018 1625   LEUKOCYTESUR NEGATIVE 02/08/2018 1625   LEUKOCYTESUR Trace 06/18/2011 1547      Radiographic Studies: Dg Chest 2  View  Result Date: 02/08/2018 CLINICAL DATA:  Increasing dyspnea over the past few days. EXAM: CHEST - 2 VIEW COMPARISON:  CT 02/07/2018 FINDINGS: Normal heart size with aortic atherosclerosis. Redemonstration of pulmonary consolidation in the right upper lobe abutting the major fissure with right hilar prominence suspicious for postobstructive change. Emphysematous changes are noted of both lungs, upper lobe predominant. Probable trace right effusion or thickening. 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 Chronic compression deformities in the upper thoracic spine with one level kyphoplasty noted. IMPRESSION: Redemonstration of pulmonary confluent opacities in the right upper lobe with  right hilar prominence suspicious for postobstructive change. Underlying malignancy is not excluded. Blunting of the right costophrenic angle may reflect a small effusion or thickening. AVN of left humeral head. Chronic moderate compression deformities of the upper thoracic spine with one level kyphoplasty noted. Electronically Signed   By: Ashley Royalty M.D.   On: 02/08/2018 14:52   Ct Chest Wo Contrast  Result Date: 02/07/2018 CLINICAL DATA:  Followup non-small cell lung cancer. EXAM: CT CHEST WITHOUT CONTRAST TECHNIQUE: Multidetector CT imaging of the chest was performed following the standard protocol without IV contrast. COMPARISON:  11/03/2017 FINDINGS: Cardiovascular: The heart size appears within normal limits. No pericardial effusion. Aortic atherosclerosis. Calcification within the LAD, left circumflex coronary arteries noted. Mediastinum/Nodes: Normal appearance of the thyroid gland. The trachea appears patent and is midline. Normal appearance of the esophagus. No enlarged axillary or supraclavicular lymph nodes. No mediastinal adenopathy identified. Within the limitations of unenhanced technique the previous right hilar mass measures 3.1 cm, image 59/2. Previously 3.0  cm. Lungs/Pleura: The lungs are emphysematous. There is a moderate partially loculated right pleural effusion which has increased in volume from the previous examination. Within the right upper lobe there is a large area of progressive masslike architectural distortion measuring 5.6 x 8.4 by 5.0 cm, image 66/2. Previously this appeared less solid measuring 7.5 by 5.2 by 3.9 cm. Surrounding ground-glass attenuation is noted which appears progressive from previous exam. There is new complete postobstructive atelectasis of the right middle lobe, image 81/5. Multiple new or enlarging nodules are identified within the right lower lobe. The largest nodule measures 1.8 cm, image 120/5. New from previous exam. Also new from previous exam is a 1.6 cm right lower lobe lung nodule, image 134/5. There are mild patchy areas of ground-glass attenuation within the left lung. Upper Abdomen: No acute abnormality. Gallstone again noted. Small bilateral low-attenuation adrenal nodules are favored to represent adenomas. Unchanged. Musculoskeletal: Multiple chronic right posterior rib deformities are identified. Rib right humeral head unchanged compression deformities within the upper thoracic spine. Avascular necrosis. IMPRESSION: 1. Large masslike area of architectural distortion within the right upper lobe appears is increased in size and increasingly solid worrisome for tumor progression. 2. The right hilar mass is similar in size to previous exam but there is near new complete atelectasis and consolidation of the right middle lobe. 3. Progressive right lower lobe pulmonary nodule concerning for metastatic disease. 4. Increase in volume of right pleural effusion. Consider further evaluation with diagnostic thoracentesis if there is a concern for malignant effusion. 5. Aortic Atherosclerosis (ICD10-I70.0) and Emphysema (ICD10-J43.9). 6. Multi vessel coronary artery atherosclerotic calcifications. Electronically Signed   By: Kerby Moors M.D.   On: 02/07/2018 16:42    EKG: Independently reviewed. Sinus rhythm at 110. Normal intervals. Normal axis. She has T-wave inversions in V1 and V2.   Assessment/Plan:   Active Problems:   COPD (chronic obstructive pulmonary disease) (HCC)   Dyspnea   Chronic diastolic heart failure (HCC)   DM type 2 with diabetic peripheral neuropathy (HCC)   Anxiety and depression   Chronic pain   Lung cancer ([place])  This is a complicated patient with multiple reasons to have respiratory failure including advanced COPD, stage III B lung cancer, pleural effusions and progressive pulmonary nodules who was admitted for subacute dyspnea. She was recently treated for presumptive community-acquired pneumonia however states she does not feel any better. She was seen by pulmonary consultation in the ED recommended admission for diuresis and possible pleural tap  in the morning.  DYSPNEA Dyspnea is essentially being managed by pulmonary consultation. They do not feel further antibiotics are warranted and have written for Lasix. Pleural tap per them tomorrow. Continue inhaled bronchodilators, low-dose prednisone at 10 mg daily and oxygen therapy.  DIABETES I'm holding metformin. Sliding scale insulin before meals at bedtime ordered.  HYPERTENSION Continue home medications of Lopressor, ARB and diuretics.  ANXIETY DISORDER Continue home regimen.  CHRONIC PAIN Continue home regimen           Other information:   DVT prophylaxis: Lovenox ordered. Code Status: DO NOT RESUSCITATE Family Communication: patient's daughter is aware that patient is admitted Disposition Plan: a benefit from discharge to an SNF Consults called: pulmonary Admission status: inpatient   The medical decision making on this patient was of high complexity and the patient is at high risk for clinical deterioration, therefore this is a level 3 visit.    Dewaine Oats Tublu Jabreel Chimento [facility]  If  7PM-7AM, please contact night-coverage [email or web address] Password [email or web address] 02/08/2018, 5:32 PM

## 2018-02-08 NOTE — Telephone Encounter (Signed)
LMTCB

## 2018-02-08 NOTE — ED Notes (Signed)
Pt unable to get a full sentence out without stopping to take a breath.

## 2018-02-08 NOTE — ED Notes (Signed)
Attempted to call report, was told the charge nurse was reviewing and contacting the [facility]. Will wait to hear back.

## 2018-02-08 NOTE — ED Provider Notes (Signed)
Patient placed in Quick Look pathway, seen and evaluated   Chief Complaint: short of breath  HPI:   Pt reports increased shortness of breath  ROS: no fever  Physical Exam:   Gen: No distress  Neuro: Awake and Alert  Skin: Warm    Focused Exam: Lung decreased breath sounds. Heart Tachy   Initiation of care has begun. The patient has been counseled on the process, plan, and necessity for staying for the completion/evaluation, and the remainder of the medical screening examination   Sidney Ace 02/08/18 1335    Carmin Muskrat, MD 02/08/18 1534

## 2018-02-08 NOTE — ED Triage Notes (Signed)
Pt arrives by [facility] for sob increasing over the last few days. Pt was recently treated for pneumonia also hx of lung CA. Had CT yesterday that showed pleural effusion.

## 2018-02-09 ENCOUNTER — Inpatient Hospital Stay (HOSPITAL_COMMUNITY): Payer: Medicare Other

## 2018-02-09 ENCOUNTER — Telehealth: Payer: Self-pay | Admitting: Medical Oncology

## 2018-02-09 ENCOUNTER — Encounter (HOSPITAL_COMMUNITY): Payer: Self-pay

## 2018-02-09 ENCOUNTER — Ambulatory Visit: Payer: Medicare Other | Admitting: Internal Medicine

## 2018-02-09 DIAGNOSIS — C3411 Malignant neoplasm of upper lobe, right bronchus or lung: Secondary | ICD-10-CM

## 2018-02-09 DIAGNOSIS — E1142 Type 2 diabetes mellitus with diabetic polyneuropathy: Secondary | ICD-10-CM

## 2018-02-09 DIAGNOSIS — R0602 Shortness of breath: Secondary | ICD-10-CM

## 2018-02-09 DIAGNOSIS — Z9889 Other specified postprocedural states: Secondary | ICD-10-CM

## 2018-02-09 LAB — BODY FLUID CELL COUNT WITH DIFFERENTIAL
Lymphs, Fluid: 84 %
MONOCYTE-MACROPHAGE-SEROUS FLUID: 4 % — AB (ref 50–90)
Neutrophil Count, Fluid: 12 % (ref 0–25)
Total Nucleated Cell Count, Fluid: 360 cu mm (ref 0–1000)

## 2018-02-09 LAB — LACTATE DEHYDROGENASE, PLEURAL OR PERITONEAL FLUID: LD, Fluid: 120 U/L — ABNORMAL HIGH (ref 3–23)

## 2018-02-09 LAB — PROTEIN, PLEURAL OR PERITONEAL FLUID: TOTAL PROTEIN, FLUID: 3.4 g/dL

## 2018-02-09 LAB — CBC
HCT: 32.8 % — ABNORMAL LOW (ref 36.0–46.0)
Hemoglobin: 9.8 g/dL — ABNORMAL LOW (ref 12.0–15.0)
MCH: 27.9 pg (ref 26.0–34.0)
MCHC: 29.9 g/dL — AB (ref 30.0–36.0)
MCV: 93.4 fL (ref 80.0–100.0)
PLATELETS: 266 10*3/uL (ref 150–400)
RBC: 3.51 MIL/uL — ABNORMAL LOW (ref 3.87–5.11)
RDW: 14.6 % (ref 11.5–15.5)
WBC: 9.7 10*3/uL (ref 4.0–10.5)
nRBC: 0 % (ref 0.0–0.2)

## 2018-02-09 LAB — COMPREHENSIVE METABOLIC PANEL
ALBUMIN: 3.2 g/dL — AB (ref 3.5–5.0)
ALK PHOS: 63 U/L (ref 38–126)
ALT: 11 U/L (ref 0–44)
AST: 14 U/L — AB (ref 15–41)
Anion gap: 7 (ref 5–15)
BILIRUBIN TOTAL: 0.8 mg/dL (ref 0.3–1.2)
BUN: 17 mg/dL (ref 8–23)
CALCIUM: 8.5 mg/dL — AB (ref 8.9–10.3)
CO2: 30 mmol/L (ref 22–32)
CREATININE: 1.21 mg/dL — AB (ref 0.44–1.00)
Chloride: 103 mmol/L (ref 98–111)
GFR calc Af Amer: 50 mL/min — ABNORMAL LOW (ref >60)
GFR calc non Af Amer: 43 mL/min — ABNORMAL LOW (ref >60)
GLUCOSE: 91 mg/dL (ref 70–99)
Potassium: 3.4 mmol/L — ABNORMAL LOW (ref 3.5–5.1)
Sodium: 140 mmol/L (ref 135–145)
Total Protein: 5.5 g/dL — ABNORMAL LOW (ref 6.5–8.1)

## 2018-02-09 LAB — LACTATE DEHYDROGENASE: LDH: 172 U/L (ref 98–192)

## 2018-02-09 LAB — GLUCOSE, CAPILLARY
GLUCOSE-CAPILLARY: 154 mg/dL — AB (ref 70–99)
Glucose-Capillary: 112 mg/dL — ABNORMAL HIGH (ref 70–99)
Glucose-Capillary: 113 mg/dL — ABNORMAL HIGH (ref 70–99)
Glucose-Capillary: 251 mg/dL — ABNORMAL HIGH (ref 70–99)
Glucose-Capillary: 86 mg/dL (ref 70–99)

## 2018-02-09 LAB — CHOLESTEROL, TOTAL: CHOLESTEROL: 153 mg/dL (ref 0–200)

## 2018-02-09 LAB — PROTEIN, TOTAL: Total Protein: 5.5 g/dL — ABNORMAL LOW (ref 6.5–8.1)

## 2018-02-09 MED ORDER — ENSURE ENLIVE PO LIQD
237.0000 mL | Freq: Two times a day (BID) | ORAL | Status: DC
  Administered 2018-02-09 – 2018-02-11 (×3): 237 mL via ORAL

## 2018-02-09 MED ORDER — POTASSIUM CHLORIDE CRYS ER 20 MEQ PO TBCR
40.0000 meq | EXTENDED_RELEASE_TABLET | Freq: Once | ORAL | Status: AC
  Administered 2018-02-09: 40 meq via ORAL
  Filled 2018-02-09: qty 2

## 2018-02-09 NOTE — Progress Notes (Addendum)
Initial Nutrition Assessment  DOCUMENTATION CODES:   Not applicable  INTERVENTION:  -Ensure Enlive BID. Each supplement provides 350 kcal and 20 grams protein.   -Downgrade to Dysphagia 3 (mechanical soft) diet   NUTRITION DIAGNOSIS:   Inadequate oral intake related to decreased appetite as evidenced by per patient/family report.  GOAL:   Patient will meet greater than or equal to 90% of their needs   MONITOR:   PO intake, Supplement acceptance, Diet advancement, Weight trends  REASON FOR ASSESSMENT:   Malnutrition Screening Tool165    ASSESSMENT:   [name] is a 72 yo female with PMH of COPD, T2DM, afib, dCHF, anxiety and depression, non-small cell lung cancer stage III on immunotherapy, recently discharged from [facility] for community-aquired PNA, admitted for subacute dyspnea. In ED pt was sen by pulmonary consultation who recommended admission for diuresis and pleural tap.    Per chart pt with right-sided non-small cell lung cancer with right lung metastasis. Pt currently followed by home hospice. Palliative care consult pending.  Visited pt at bedside. She reports decreased appetite of ~2 months due to illness (pneumonia).  Pt also with pleural effusion causing discomfort. She reports taste changes described as constant metallic taste in mouth.  Pt reports having "2 bad teeth" which cause pain while chewing. Pt amenable to changing to mechanical soft diet for ease of intake.   Breakfast tray at bedside; visualized ~80% completion. No other meal completion available per chart.  Obtained bed wt of 73.7 kg. Wt trending down however difficult to decipher wt trends due to alterations in fluid status.   Pt lives at home w/ daughter who cooks her meals. Pt reports having Ensure at home in fridge. She is amenable to receiving them during hospitalization.   Meds: lasix, novolog ss, Protonix, prednisone Labs: CBGs 80-152, potassium 3.4  NUTRITION - FOCUSED PHYSICAL EXAM:   Most Recent Value  Orbital Region  No depletion  Upper Arm Region  Mild depletion  Thoracic and Lumbar Region  No depletion  Buccal Region  No depletion  Temple Region  Mild depletion  Clavicle Bone Region  Mild depletion  Clavicle and Acromion Bone Region  Mild depletion  Scapular Bone Region  Unable to assess  Dorsal Hand  Mild depletion  Patellar Region  No depletion  Anterior Thigh Region  No depletion  Posterior Calf Region  No depletion  Edema (RD Assessment)  Moderate  Hair  Reviewed  Eyes  Reviewed  Mouth  Reviewed  Skin  Reviewed  Nails  Reviewed       Diet Order:   Diet Order            DIET DYS 3 Room service appropriate? Yes; Fluid consistency: Thin  Diet effective now              EDUCATION NEEDS:   No education needs have been identified at this time  Skin:  Skin Assessment: Reviewed RN Assessment  Last BM:  10/30 per pt  Height:   Ht Readings from Last 1 Encounters:  02/09/18 5\' 6"  (1.676 m)    Weight:   Wt Readings from Last 1 Encounters:  02/09/18 73.7 kg    Ideal Body Weight:  59 kg  BMI:  Body mass index is 26.22 kg/m.  Estimated Nutritional Needs:   Kcal:  1650-1800  Protein:  83-90 grams  Fluid:  >/=1.65 L/day    Naida Sleight, Dietetic Intern [phone]

## 2018-02-09 NOTE — Progress Notes (Signed)
NAME:  [name], MRN:  [id number], DOB:  1945-05-06, LOS: 1 ADMISSION DATE:  02/08/2018, CONSULTATION DATE:  10/30 REFERRING MD:  Jeanell Sparrow, CHIEF COMPLAINT:  Dyspnea    Brief History   72 year old female with chronic respiratory failure in setting of advanced COPD, diastolic heart failure, and deconditioning just discharged from hospital.  Presents back to the emergency room on 10/39 days following discharge for progressive shortness of breath  Past Medical History  OPD on oxygen and prednisone, diastolic heart dysfunction.  Adenocarcinoma of the lung, recent transudate right pleural effusion Significant Hospital Events     Consults: date of consult/date signed off & final recs:  Merry consulted 10/30  Procedures (surgical and bedside):    Significant Diagnostic Tests:   10/30: CT chest:Large masslike area of the right upper lobe which appears somewhat progressed in size there is right hilar mass which is similar to prior exam there is new atelectasis and consolidation of the right middle lobe which may be postobstructive in nature.  Progressive right lower lobe pulmonary nodules concerning for metastatic disease there are small right pleural effusion.  Micro Data:   Antimicrobials:    Subjective:  Feels a little better  Objective   Blood pressure (Abnormal) 116/46, pulse 89, temperature 98.3 F (36.8 C), temperature source Oral, resp. rate 18, height 5\' 6"  (1.676 m), weight 73.7 kg, SpO2 99 %.    FiO2 (%):  [97 %] 97 %   Intake/Output Summary (Last 24 hours) at 02/09/2018 1317 Last data filed at 02/08/2018 1859 Gross per 24 hour  Intake no documentation  Output 850 ml  Net -850 ml   Filed Weights   02/09/18 1135  Weight: 73.7 kg    Examination: General 72 year old female resting in bed. No distress HENT NCAT no JVD  Pulm no wheeze decreased right base  Card RRR w/out MRG  Ext brisk CR  abd not tender  Neuro intact  Resolved Hospital Problem list      Assessment & Plan:   Acute on chronic respiratory failure suspect primarily decompensated diastolic HF w/ volume overload, pleural effusion and postobstructive atelectasis superimposed on underlying severe COPD, further c/b severe deconditioning -I think she has very little reserves from a pulmonary stand-point given she already has advanced lung disease further c/b post-obstructive atelectasis from her lung mass. The pleural effusion is moderate to large in size. It was transudate on last thora.  -pcxr personally reviewed RUL density worse. Little change in effusion.  Plan thora today (both therapeutic and diagnostic) Cont BDs Cont pred  Cont oxygen  F/u CXR and pleural fluid analysis   Non-small cell lung cancer currently on immunotherapy (nivolumab) -Could have progression of her cancer Plan F/u cytology  F/u w/ heme/onc    Diastolic HF  Plan Cont lasix and daily weight   Chronic pain /anxiety  Plan Cont home rx   DM Plan ssi   Deconditioning/FTT Plan Consider PT consult    Disposition / Summary of Today's Plan 02/09/18   Would consider PT consult -->think she needs SNF      Labs   CBC: Recent Labs  Lab 02/07/18 1108 02/08/18 1342 02/09/18 0419  WBC 12.3* 11.5* 9.7  NEUTROABS 10.9* 10.3*  --   HGB 10.7* 11.0* 9.8*  HCT 35.2* 35.8* 32.8*  MCV 96.2 94.7 93.4  PLT 280 282 673    Basic Metabolic Panel: Recent Labs  Lab 02/07/18 1108 02/08/18 1342 02/09/18 0419  NA 142 139 140  K  3.8 4.0 3.4*  CL 102 103 103  CO2 29 28 30   GLUCOSE 159* 157* 91  BUN 15 14 17   CREATININE 1.14* 1.02* 1.21*  CALCIUM 9.6 9.2 8.5*   GFR: Estimated Creatinine Clearance: 42.6 mL/min (A) (by C-G formula based on SCr of 1.21 mg/dL (H)). Recent Labs  Lab 02/07/18 1108 02/08/18 1342 02/08/18 1456 02/08/18 1607 02/09/18 0419  WBC 12.3* 11.5*  --   --  9.7  LATICACIDVEN  --   --  1.92* 0.94  --     Liver Function Tests: Recent Labs  Lab 02/07/18 1108  02/08/18 1342 02/09/18 0419  AST 13* 16 14*  ALT 10 13 11   ALKPHOS 84 72 63  BILITOT 0.5 0.7 0.8  PROT 6.6 6.4* 5.5*  ALBUMIN 3.6 3.5 3.2*   No results for input(s): LIPASE, AMYLASE in the last 168 hours. No results for input(s): AMMONIA in the last 168 hours.  ABG    Component Value Date/Time   HCO3 35.1 (H) 02/08/2018 1611   TCO2 37 (H) 02/08/2018 1611   O2SAT 65.0 02/08/2018 1611     Coagulation Profile: Recent Labs  Lab 02/08/18 2141  INR 0.96    Cardiac Enzymes: No results for input(s): CKTOTAL, CKMB, CKMBINDEX, TROPONINI in the last 168 hours.  HbA1C: Hgb A1c MFr Bld  Date/Time Value Ref Range Status  12/10/2017 03:31 AM 5.8 (H) 4.8 - 5.6 % Final    Comment:    (NOTE) Pre diabetes:          5.7%-6.4% Diabetes:              >6.4% Glycemic control for   <7.0% adults with diabetes   07/04/2017 04:45 PM 5.9 4.6 - 6.5 % Final    Comment:    Glycemic Control Guidelines for People with Diabetes:Non Diabetic:  <6%Goal of Therapy: <7%Additional Action Suggested:  >8%     CBG: Recent Labs  Lab 02/08/18 1851 02/08/18 2221 02/09/18 0841 02/09/18 1255  GLUCAP 119* 152* 112* [place] ACNP-BC [place] Pager # [phone] OR # [phone] if no answer

## 2018-02-09 NOTE — Telephone Encounter (Signed)
Attempted to contact pt. I did not receive an answer. I have left a message for pt to return our call.  

## 2018-02-09 NOTE — Progress Notes (Signed)
Assumed care of pt from  ED .  VSS with no  distress noted. Pt was Oriented to unit and fall education completed . Pt Verbalized  Understanding. Pt with Blanchable redness to sacrum otherwise skin intact

## 2018-02-09 NOTE — Evaluation (Signed)
Physical Therapy Evaluation Patient Details Name: [name] MRN: [id number] DOB: 12-29-1945 Today's Date: 02/09/2018   History of Present Illness  Pt is an 72 y/o female with PMH significant for chronic hypoxic respiratory failure secondary to advanced COPD and complicated by non-small cell lung cancer on immunotherapy, DM, atrial fibrillation, diastolic heart failure who was recently discharged from [facility] last week after treatment for presumed CAP, comes back for not feeling well. Work-up here showed she had right-sided pleural effusion along with lung cancer and atelectasis.  Clinical Impression  Pt admitted with above diagnosis. Pt currently with functional limitations due to the deficits listed below (see PT Problem List). At the time of PT eval pt was able to perform transfers and ambulation with gross supervision for safety with RW. RW helpful for energy conservation as well as balance support - pt typically uses a rollator at home which is likely easier for her to manage. Pt near baseline of function. Acutely, pt will benefit from skilled PT to increase their independence and safety with mobility to allow discharge to the venue listed below.     Follow Up Recommendations No PT follow up;Supervision for mobility/OOB    Equipment Recommendations  None recommended by PT    Recommendations for Other Services       Precautions / Restrictions Precautions Precautions: Fall Precaution Comments: 3L/min supplemental O2 at home Restrictions Weight Bearing Restrictions: No      Mobility  Bed Mobility Overal bed mobility: Modified Independent Bed Mobility: Supine to Sit;Sit to Supine           General bed mobility comments: No assist required for transition to/from EOB. HOB elevated but feel pt could have managed with HOB flat if needed.   Transfers Overall transfer level: Needs assistance Equipment used: Rolling walker (2 wheeled) Transfers: Sit to/from Stand Sit to Stand:  Supervision         General transfer comment: Light supervision for safety. No assist required.   Ambulation/Gait Ambulation/Gait assistance: Supervision Gait Distance (Feet): 50 Feet(25' x2) Assistive device: Rolling walker (2 wheeled) Gait Pattern/deviations: Step-through pattern;Decreased stride length Gait velocity: Decreased Gait velocity interpretation: 1.31 - 2.62 ft/sec, indicative of limited community ambulator General Gait Details: Supervision for safety and management of O2 tank. Pt ambulated in room only x2. After walk on 3L/min supplemental O2, HR 120 bpm and SpO2 96%  Stairs            Wheelchair Mobility    Modified Rankin (Stroke Patients Only)       Balance Overall balance assessment: Needs assistance Sitting-balance support: Feet supported Sitting balance-[name] Scale: Good     Standing balance support: No upper extremity supported;During functional activity Standing balance-[name] Scale: Fair Standing balance comment: Steadier with RW however able to manage without UE support                             Pertinent Vitals/Pain Pain Assessment: No/denies pain    Home Living Family/patient expects to be discharged to:: Private residence Living Arrangements: Children Available Help at Discharge: Family;Personal care attendant;Available PRN/intermittently Type of Home: House Home Access: Stairs to enter Entrance Stairs-Rails: None Entrance Stairs-Number of Steps: 6 in the front, 3 in the back spaced out Home Layout: Two level;Able to live on main level with bedroom/bathroom Home Equipment: Shower seat;Bedside commode;Cane - single point;Walker - 2 wheels;Walker - 4 wheels;Transport chair Additional Comments: Above information is for daughter's house who she is  going to stay with after d/c.     Prior Function Level of Independence: Needs assistance   Gait / Transfers Assistance Needed: Mainly transferring to transfer chair due to fatigue  and SOB since last admission at [place] / Homemaking Assistance Needed: Aide washes back and shampoos hair and assists pt in and out of the tub. Aide also assists with dressing.         Hand Dominance   Dominant Hand: Right    Extremity/Trunk Assessment   Upper Extremity Assessment Upper Extremity Assessment: Generalized weakness    Lower Extremity Assessment Lower Extremity Assessment: Generalized weakness    Cervical / Trunk Assessment Cervical / Trunk Assessment: Kyphotic  Communication   Communication: No difficulties  Cognition Arousal/Alertness: Awake/alert Behavior During Therapy: WFL for tasks assessed/performed Overall Cognitive Status: Within Functional Limits for tasks assessed                                        General Comments      Exercises     Assessment/Plan    PT Assessment Patient needs continued PT services  PT Problem List Decreased strength;Decreased range of motion;Decreased activity tolerance;Decreased balance;Decreased mobility;Decreased knowledge of use of DME;Decreased safety awareness;Decreased knowledge of precautions;Cardiopulmonary status limiting activity       PT Treatment Interventions DME instruction;Gait training;Stair training;Functional mobility training;Therapeutic activities;Therapeutic exercise;Neuromuscular re-education;Patient/family education    PT Goals (Current goals can be found in the Care Plan section)  Acute Rehab PT Goals Patient Stated Goal: none stated PT Goal Formulation: With patient Time For Goal Achievement: 02/16/18 Potential to Achieve Goals: Good    Frequency Min 3X/week   Barriers to discharge        Co-evaluation               AM-PAC PT "6 Clicks" Daily Activity  Outcome Measure Difficulty turning over in bed (including adjusting bedclothes, sheets and blankets)?: None Difficulty moving from lying on back to sitting on the side of the bed? : None Difficulty sitting  down on and standing up from a chair with arms (e.g., wheelchair, bedside commode, etc,.)?: A Little Help needed moving to and from a bed to chair (including a wheelchair)?: A Little Help needed walking in hospital room?: A Little Help needed climbing 3-5 steps with a railing? : A Little 6 Click Score: 20    End of Session Equipment Utilized During Treatment: Oxygen;Gait belt Activity Tolerance: Patient tolerated treatment well Patient left: in bed;with call bell/phone within reach Nurse Communication: Mobility status PT Visit Diagnosis: Unsteadiness on feet (R26.81);Difficulty in walking, not elsewhere classified (R26.2)    Time: 1448-1856 PT Time Calculation (min) (ACUTE ONLY): 23 min   Charges:   PT Evaluation $PT Eval Moderate Complexity: 1 Mod PT Treatments $Gait Training: 8-22 mins        Rolinda Roan, PT, DPT Acute Rehabilitation Services Pager: [phone] Office: [phone]   Thelma Comp 02/09/2018, 2:46 PM

## 2018-02-09 NOTE — Progress Notes (Signed)
PMT RN Note: Consult order noted. PMT is experiencing very high consult volume and will be staffed with emergency coverage only on 11/1 due to the team being out of the office at the [facility].   If you need interim recommendations, please call [phone] to leave a message for the nurse. All medical emergencies should be directed to the attending physician.   Once there is an available provider (likely 11/2 or 11/3 due to weekend staffing), this patient will be evaluated and seen by our team.  Marjie Skiff. Aviyon Hocevar, RN, BSN, [facility] Palliative Medicine Team 02/09/2018 1:42 PM Office [phone]

## 2018-02-09 NOTE — Procedures (Signed)
Thoracentesis Procedure Note  Pre-operative Diagnosis:right pleural effusion   Post-operative Diagnosis: normal, same  Indications: evaluation of pleural fluid and treatment of shortness of breath     Procedure Details  Consent: Informed consent was obtained. Risks of the procedure were discussed including: infection, bleeding, pain, pneumothorax.  Under sterile conditions the patient was positioned. Betadine solution and sterile drapes were utilized.  1% buffered lidocaine was used to anesthetize the rib space which was identified via real time [place]. Fluid was obtained without any difficulties and minimal blood loss.  A dressing was applied to the wound and wound care instructions were provided.   Findings 1000 ml of clear pleural fluid was obtained. A sample was sent to Pathology for cell counts, as well as for infection analysis.  Complications:  None; patient tolerated the procedure well.          Condition: stable  Plan A follow up chest x-ray was ordered.   Erick Colace ACNP-BC [place] Pager # [phone] OR # [phone] if no answer

## 2018-02-09 NOTE — Progress Notes (Signed)
PROGRESS NOTE                                                                                                                                                                                                             Patient Demographics:    [name], is a 72 y.o. female, DOB - 05-Sep-1945, YKD:[id number]  Admit date - 02/08/2018   Admitting Physician Vashti Hey, MD  Outpatient Primary MD for the patient is [name], DO  LOS - 1  Chief Complaint  Patient presents with  . Shortness of Breath       Brief Narrative  [name] is an 71 y.o. 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  She was seen by pulmonary and we were requested to admit the patient.   Subjective:    [name] today has, No headache, No chest pain, No abdominal pain - No Nausea, No new weakness tingling or numbness, No Cough - Mild SOB.   Assessment  & Plan :     1.  Acute on chronic hypoxic respiratory failure.  She is already on home oxygen, appears to be in no distress, her mild decompensation was most likely due to some element of acute on chronic diastolic CHF, she has underlying right-sided lung cancer with right-sided atelectasis and right-sided pleural effusion.  He is very minimally decompensated and close to her baseline however I think her issues mostly a psychological and she is afraid to go home, she is home hospice status and is also followed by oncologist [name] and pulmonologist [name].  This morning she appears to be in  no distress however she certainly does not want to go home, I discussed her case with the pulmonary team.  They would like to repeat a two-view chest x-ray, continue diuresis for CHF decompensation, if effusion is worsening then try ultrasound-guided thoracentesis.  She appears close to baseline I will have palliative care also talked to the patient to discuss goals of care and for patient to realize her guarded state of health, she already is followed by home hospice but I do not think she has realistic  expectations.  2.  Acute on chronic diastolic CHF 25% on recent echocardiogram.  Continue IV Lasix, continue beta-blocker and monitor.  3.  Right-sided non-small cell lung cancer with right lung mets, hilar involvement with right-sided lung collapse/atelectasis and right-sided pleural effusion.  This effusion was recently tapped and it was a transudate, see discussion above.  Pulmonary team involved.  Follow with [name] oncologist post discharge.  4.  Deconditioning.  PT eval.  5.  Essential hypertension.  Stable continue Lopressor, ARB along with Lasix.  6.  Chronic anxiety and chronic pain.  Home regimen continued.  7.  DM type II.  Hold metformin.  Continue sliding scale.  CBG (last 3)  Recent Labs    02/08/18 1851 02/08/18 2221 02/09/18 0841  GLUCAP 119* 152* 112*     Family Communication  :  None  Code Status :  DNR  Disposition Plan  :  Med  Consults  :  Pulm  Procedures  :    CT - 1. Large masslike area of architectural distortion within the right upper lobe appears is increased in size and increasingly solid worrisome for tumor progression. 2. The right hilar mass is similar in size to previous exam but there is near new complete atelectasis and consolidation of the right middle lobe. 3. Progressive right lower lobe pulmonary nodule concerning for metastatic disease. 4. Increase in volume of right pleural effusion. Consider further evaluation with diagnostic  thoracentesis if there is a concern for malignant effusion. 5. Aortic Atherosclerosis (ICD10-I70.0) and Emphysema (ICD10-J43.9). 6. Multi vessel coronary artery atherosclerotic calcifications.  DVT Prophylaxis  :  Lovenox    Lab Results  Component Value Date   PLT 266 02/09/2018    Diet :  Diet Order            Diet Carb Modified Fluid consistency: Thin; Room service appropriate? Yes  Diet effective now               Inpatient Medications Scheduled Meds: . aspirin EC  81 mg Oral Daily  . enoxaparin (LOVENOX) injection  40 mg Subcutaneous QHS  . furosemide  40 mg Intravenous BID  . guaiFENesin  1,200 mg Oral BID  . insulin aspart  0-9 Units Subcutaneous TID WC  . ipratropium-albuterol  3 mL Nebulization Q6H  . irbesartan  75 mg Oral Daily  . loratadine  10 mg Oral Daily  . metoprolol tartrate  25 mg Oral BID  . pantoprazole  40 mg Oral BID AC  . predniSONE  10 mg Oral Q breakfast   Continuous Infusions: PRN Meds:.ALPRAZolam, HYDROcodone-acetaminophen, nitroGLYCERIN, ondansetron, senna, simethicone, sodium chloride, traMADol  Antibiotics  :   Anti-infectives (From admission, onward)   None          Objective:   Vitals:   02/09/18 0104 02/09/18 0522 02/09/18 0959 02/09/18 1135  BP:  (!) 116/46    Pulse:  89    Resp:  18    Temp:  98.3 F (36.8 C)    TempSrc:  Oral    SpO2: 96% 99%    Weight:    73.7 kg  Height:   5\' 6"  (1.676 m)     Wt Readings from Last 3 Encounters:  02/09/18 73.7 kg  01/27/18 76.3 kg  01/23/18 74.1 kg     Intake/Output Summary (Last 24 hours) at 02/09/2018 1156 Last data filed at 02/08/2018 1859 Gross per 24 hour  Intake -  Output 850 ml  Net -850 ml  Physical Exam  Awake Alert, Oriented X 3, No new F.N deficits, Normal affect [place].AT,PERRAL Supple Neck,No JVD, No cervical lymphadenopathy appriciated.  Symmetrical Chest wall movement, Good air movement bilaterally, few rales RRR,No Gallops,Rubs or new Murmurs, No  Parasternal Heave +ve B.Sounds, Abd Soft, No tenderness, No organomegaly appriciated, No rebound - guarding or rigidity. No Cyanosis, Clubbing or edema, No new Rash or bruise       Data Review:    CBC Recent Labs  Lab 02/07/18 1108 02/08/18 1342 02/09/18 0419  WBC 12.3* 11.5* 9.7  HGB 10.7* 11.0* 9.8*  HCT 35.2* 35.8* 32.8*  PLT 280 282 266  MCV 96.2 94.7 93.4  MCH 29.2 29.1 27.9  MCHC 30.4 30.7 29.9*  RDW 14.8 14.5 14.6  LYMPHSABS 0.7 0.7  --   MONOABS 0.5 0.4  --   EOSABS 0.1 0.0  --   BASOSABS 0.0 0.0  --     Chemistries  Recent Labs  Lab 02/07/18 1108 02/08/18 1342 02/09/18 0419  NA 142 139 140  K 3.8 4.0 3.4*  CL 102 103 103  CO2 29 28 30   GLUCOSE 159* 157* 91  BUN 15 14 17   CREATININE 1.14* 1.02* 1.21*  CALCIUM 9.6 9.2 8.5*  AST 13* 16 14*  ALT 10 13 11   ALKPHOS 84 72 63  BILITOT 0.5 0.7 0.8   ------------------------------------------------------------------------------------------------------------------ No results for input(s): CHOL, HDL, LDLCALC, TRIG, CHOLHDL, LDLDIRECT in the last 72 hours.  Lab Results  Component Value Date   HGBA1C 5.8 (H) 12/10/2017   ------------------------------------------------------------------------------------------------------------------ Recent Labs    02/07/18 1109  TSH 0.838   ------------------------------------------------------------------------------------------------------------------ No results for input(s): VITAMINB12, FOLATE, FERRITIN, TIBC, IRON, RETICCTPCT in the last 72 hours.  Coagulation profile Recent Labs  Lab 02/08/18 2141  INR 0.96    No results for input(s): DDIMER in the last 72 hours.  Cardiac Enzymes No results for input(s): CKMB, TROPONINI, MYOGLOBIN in the last 168 hours.  Invalid input(s): CK ------------------------------------------------------------------------------------------------------------------    Component Value Date/Time   BNP 39.5 01/27/2018 1526    Micro  Results No results found for this or any previous visit (from the past 240 hour(s)).  Radiology Reports    Ct Chest Wo Contrast  Result Date: 02/07/2018 CLINICAL DATA:  Followup non-small cell lung cancer. EXAM: CT CHEST WITHOUT CONTRAST TECHNIQUE: Multidetector CT imaging of the chest was performed following the standard protocol without IV contrast. COMPARISON:  11/03/2017 FINDINGS: Cardiovascular: The heart size appears within normal limits. No pericardial effusion. Aortic atherosclerosis. Calcification within the LAD, left circumflex coronary arteries noted. Mediastinum/Nodes: Normal appearance of the thyroid gland. The trachea appears patent and is midline. Normal appearance of the esophagus. No enlarged axillary or supraclavicular lymph nodes. No mediastinal adenopathy identified. Within the limitations of unenhanced technique the previous right hilar mass measures 3.1 cm, image 59/2. Previously 3.0 cm. Lungs/Pleura: The lungs are emphysematous. There is a moderate partially loculated right pleural effusion which has increased in volume from the previous examination. Within the right upper lobe there is a large area of progressive masslike architectural distortion measuring 5.6 x 8.4 by 5.0 cm, image 66/2. Previously this appeared less solid measuring 7.5 by 5.2 by 3.9 cm. Surrounding ground-glass attenuation is noted which appears progressive from previous exam. There is new complete postobstructive atelectasis of the right middle lobe, image 81/5. Multiple new or enlarging nodules are identified within the right lower lobe. The largest nodule measures 1.8 cm, image 120/5. New from previous exam. Also new from previous  exam is a 1.6 cm right lower lobe lung nodule, image 134/5. There are mild patchy areas of ground-glass attenuation within the left lung. Upper Abdomen: No acute abnormality. Gallstone again noted. Small bilateral low-attenuation adrenal nodules are favored to represent adenomas.  Unchanged. Musculoskeletal: Multiple chronic right posterior rib deformities are identified. Rib right humeral head unchanged compression deformities within the upper thoracic spine. Avascular necrosis. IMPRESSION: 1. Large masslike area of architectural distortion within the right upper lobe appears is increased in size and increasingly solid worrisome for tumor progression. 2. The right hilar mass is similar in size to previous exam but there is near new complete atelectasis and consolidation of the right middle lobe. 3. Progressive right lower lobe pulmonary nodule concerning for metastatic disease. 4. Increase in volume of right pleural effusion. Consider further evaluation with diagnostic thoracentesis if there is a concern for malignant effusion. 5. Aortic Atherosclerosis (ICD10-I70.0) and Emphysema (ICD10-J43.9). 6. Multi vessel coronary artery atherosclerotic calcifications. Electronically Signed   By: Kerby Moors M.D.   On: 02/07/2018 16:42   Dg Chest Port 1 View  Result Date: 02/09/2018 CLINICAL DATA:  Shortness of breath EXAM: PORTABLE CHEST 1 VIEW COMPARISON:  02/08/2018 FINDINGS: Increasing pulmonary opacity within the right upper lobe. Persistent right hilar density is noted consistent with the known underlying mass. Right-sided pleural effusion is seen. Left lung is clear. A skin fold lies laterally over the left lung. No bony abnormality is seen. Postsurgical changes are again noted. IMPRESSION: Increasing density in the right upper lobe likely representing some postobstructive change the right hilar mass is again noted and stable. Large right pleural effusion is again seen and stable. Electronically Signed   By: Inez Catalina M.D.   On: 02/09/2018 07:10         Time Spent in minutes  30   Lala Lund M.D on 02/09/2018 at 11:56 AM  To page go to [email or web address] - password [email or web address]

## 2018-02-09 NOTE — Consult Note (Signed)
   [facility] Inpatient Consult   02/09/2018  [name] 1946/02/13 [id number]   Patient screened for with extreme high risk score for unplanned readmissions with Medicare in [place] and for [facility] services needed. 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  She was seen by pulmonary and we were requested to admit the patient.  Patient has a palliative care encounter noted on 02/02/18 with [facility] with their home base palliative program. A palliaitive consult has been ordered for inpatient as well.  Will follow for progress and disposition for [facility] Care Management needs..   Please place a [facility] Care Management consult or for questions contact:   Natividad Brood, RN BSN [facility] Liaison  [phone] business mobile phone Toll free office [phone]

## 2018-02-09 NOTE — Telephone Encounter (Signed)
LVM to call when she gets out of hospital/ Schedule request sent.

## 2018-02-10 ENCOUNTER — Inpatient Hospital Stay (HOSPITAL_COMMUNITY): Payer: Medicare Other

## 2018-02-10 ENCOUNTER — Telehealth: Payer: Self-pay | Admitting: Internal Medicine

## 2018-02-10 LAB — CHOLESTEROL, TOTAL: CHOLESTEROL: 173 mg/dL (ref 0–200)

## 2018-02-10 LAB — BASIC METABOLIC PANEL
ANION GAP: 9 (ref 5–15)
BUN: 25 mg/dL — AB (ref 8–23)
CHLORIDE: 101 mmol/L (ref 98–111)
CO2: 27 mmol/L (ref 22–32)
Calcium: 8.9 mg/dL (ref 8.9–10.3)
Creatinine, Ser: 1.22 mg/dL — ABNORMAL HIGH (ref 0.44–1.00)
GFR calc Af Amer: 50 mL/min — ABNORMAL LOW (ref >60)
GFR, EST NON AFRICAN AMERICAN: 43 mL/min — AB (ref >60)
GLUCOSE: 108 mg/dL — AB (ref 70–99)
POTASSIUM: 3.9 mmol/L (ref 3.5–5.1)
Sodium: 137 mmol/L (ref 135–145)

## 2018-02-10 LAB — GLUCOSE, CAPILLARY
GLUCOSE-CAPILLARY: 148 mg/dL — AB (ref 70–99)
GLUCOSE-CAPILLARY: 162 mg/dL — AB (ref 70–99)
Glucose-Capillary: 116 mg/dL — ABNORMAL HIGH (ref 70–99)
Glucose-Capillary: 173 mg/dL — ABNORMAL HIGH (ref 70–99)

## 2018-02-10 LAB — PH, BODY FLUID: pH, Body Fluid: 7.9

## 2018-02-10 LAB — MAGNESIUM: MAGNESIUM: 1.3 mg/dL — AB (ref 1.7–2.4)

## 2018-02-10 MED ORDER — ALBUTEROL SULFATE (2.5 MG/3ML) 0.083% IN NEBU
2.5000 mg | INHALATION_SOLUTION | Freq: Three times a day (TID) | RESPIRATORY_TRACT | Status: DC
  Administered 2018-02-10 – 2018-02-11 (×4): 2.5 mg via RESPIRATORY_TRACT
  Filled 2018-02-10 (×4): qty 3

## 2018-02-10 MED ORDER — METOPROLOL TARTRATE 25 MG PO TABS
25.0000 mg | ORAL_TABLET | Freq: Once | ORAL | Status: AC
  Administered 2018-02-10: 25 mg via ORAL
  Filled 2018-02-10: qty 1

## 2018-02-10 MED ORDER — SODIUM CHLORIDE 0.9 % IV BOLUS: 250.0000 mL | Freq: Once | INTRAVENOUS | Status: DC

## 2018-02-10 MED ORDER — FUROSEMIDE 10 MG/ML IJ SOLN
60.0000 mg | Freq: Once | INTRAMUSCULAR | Status: AC
  Administered 2018-02-10: 60 mg via INTRAVENOUS
  Filled 2018-02-10: qty 6

## 2018-02-10 MED ORDER — GUAIFENESIN-DM 100-10 MG/5ML PO SYRP
5.0000 mL | ORAL_SOLUTION | ORAL | Status: DC | PRN
  Administered 2018-02-10 – 2018-02-11 (×2): 5 mL via ORAL
  Filled 2018-02-10 (×2): qty 5

## 2018-02-10 MED ORDER — POTASSIUM CHLORIDE CRYS ER 20 MEQ PO TBCR
40.0000 meq | EXTENDED_RELEASE_TABLET | Freq: Once | ORAL | Status: AC
  Administered 2018-02-10: 40 meq via ORAL
  Filled 2018-02-10: qty 2

## 2018-02-10 MED ORDER — PHENYLEPHRINE HCL-NACL 10-0.9 MG/250ML-% IV SOLN: 0.0000 ug/min | INTRAVENOUS | Status: DC

## 2018-02-10 MED ORDER — IPRATROPIUM-ALBUTEROL 0.5-2.5 (3) MG/3ML IN SOLN: 3.0000 mL | Freq: Three times a day (TID) | RESPIRATORY_TRACT | Status: DC

## 2018-02-10 MED ORDER — METOPROLOL TARTRATE 50 MG PO TABS
50.0000 mg | ORAL_TABLET | Freq: Two times a day (BID) | ORAL | Status: DC
  Administered 2018-02-11: 50 mg via ORAL
  Filled 2018-02-10: qty 1

## 2018-02-10 MED ORDER — INSULIN ASPART 100 UNIT/ML ~~LOC~~ SOLN: 0.0000 [IU] | Freq: Every day | SUBCUTANEOUS | Status: DC

## 2018-02-10 MED ORDER — LORAZEPAM 2 MG/ML IJ SOLN
0.5000 mg | Freq: Once | INTRAMUSCULAR | Status: AC
  Administered 2018-02-10: 0.5 mg via INTRAVENOUS
  Filled 2018-02-10: qty 1

## 2018-02-10 MED ORDER — INSULIN ASPART 100 UNIT/ML ~~LOC~~ SOLN: 0.0000 [IU] | SUBCUTANEOUS | Status: DC

## 2018-02-10 MED ORDER — INSULIN ASPART 100 UNIT/ML ~~LOC~~ SOLN
0.0000 [IU] | Freq: Three times a day (TID) | SUBCUTANEOUS | Status: DC
  Administered 2018-02-10: 2 [IU] via SUBCUTANEOUS
  Administered 2018-02-10 – 2018-02-11 (×2): 1 [IU] via SUBCUTANEOUS

## 2018-02-10 NOTE — Progress Notes (Signed)
Pt HR 117, unable to take metoprolol due to low diastolic pressures. Current BP 116/51, Map of 70. Contacted Schorr, NP. Will continue to monitor.

## 2018-02-10 NOTE — Progress Notes (Signed)
Spoke with patients son Mr. Hardin Negus. Per son he will be here by 1000 to pick his mom up for discharge.

## 2018-02-10 NOTE — Progress Notes (Signed)
Informed Patient that she will be ab express discharge tomorrow. Per patient daughter is out of town and will not be able to come and pick her up. Per patient she would liek to call son first to come and pick her up. If son doesn't answer this RN will call both children about discharge.

## 2018-02-10 NOTE — Telephone Encounter (Signed)
Scheduled appt per 10/31 sch message - left message for patient with appt date and time.

## 2018-02-10 NOTE — Telephone Encounter (Signed)
Attempted to call pt but no answer. Left message for pt to return call. 

## 2018-02-10 NOTE — Progress Notes (Signed)
PROGRESS NOTE                                                                                                                                                                                                             Patient Demographics:    [name], is a 72 y.o. female, DOB - [birth year], SLH:[id number]  Admit date - 02/08/2018   Admitting Physician Vashti Hey, MD  Outpatient Primary MD for the patient is [name], DO  LOS - 2  Chief Complaint  Patient presents with  . Shortness of Breath       Brief Narrative  [name] is an 72 y.o. female  with past medical history significant for chronic hypoxic respiratory failure secondary to advanced COPD and complicated by non-small cell lung cancer on immunotherapy, diabetes mellitus, atrial fibrillation, diastolic heart failure  was recently discharged from [facility] last week after treatment for presumed community-acquired pneumonia, comes back for not feeling well work-up here showed she had right-sided pleural effusion along with lung cancer and atelectasis.  She was seen by pulmonary and we were requested to admit the patient.   Subjective:    [name] today has, No headache, No chest pain, No abdominal pain - No Nausea, No new weakness tingling or numbness, No Cough - Mild SOB.   Assessment  & Plan :     1.  Acute on chronic hypoxic respiratory failure.  She is already on home oxygen, appears to be in no distress, she had mild decompensation was most likely due to some element of acute on chronic diastolic CHF, she has underlying right-sided lung cancer with right-sided atelectasis and moderate to large right-sided pleural effusion.  She is very minimally decompensated and close to her baseline however I think her issues mostly a psychological and she is afraid to go home, she is home hospice status and is also followed by oncologist [name] and pulmonologist [name].  She still  does not feel confident going home and states that she will come right back, I think she has still not reconciled with her medical situation.  She does have recurrent right-sided pleural effusion and underwent ultrasound-guided thoracentesis by pulmonary on 02/09/2018 yielding exudative fluid, cytology is pending, previously this fluid was transudative, repeat two-view chest x-ray shows right-sided mass but overall effusion is minimal, will await pulmonary input post thoracentesis, await palliative care to provide their opinion as well.  She appears close to baseline I will have palliative  care also talked to the patient to discuss goals of care and for patient to realize her guarded state of health, she already is followed by home hospice but I do not think she has realistic expectations.  2.  Acute on chronic diastolic CHF 37% on recent echocardiogram.  Continue IV Lasix, continue beta-blocker and monitor.  3.  Right-sided non-small cell lung cancer with right lung mets, hilar involvement with right-sided lung collapse/atelectasis and right-sided pleural effusion.  This effusion was recently tapped and it was a transudate, see discussion above.  Pulmonary team involved.  Follow with [name] oncologist post discharge.  Monitor cytology from thoracentesis done this admission on 02/09/2018, fluid now seems to be transudative.  4.  Deconditioning.  PT eval.  5.  Essential hypertension.  Stable continue Lopressor, ARB along with Lasix.  6.  Chronic anxiety and chronic pain.  Home regimen continued.  7.  DM type II.  Hold metformin.  Continue sliding scale.  CBG (last 3)  Recent Labs    02/09/18 2155 02/09/18 2353 02/10/18 0806  GLUCAP 86 113* 116*     Family Communication  :  None  Code Status :  DNR  Disposition Plan  :  Med  Consults  :  Pulm  Procedures  :    US guided right-sided thoracentesis on 02/09/2018.  CT - 1. Large masslike area of architectural distortion within  the right upper lobe appears is increased in size and increasingly solid worrisome for tumor progression. 2. The right hilar mass is similar in size to previous exam but there is near new complete atelectasis and consolidation of the right middle lobe. 3. Progressive right lower lobe pulmonary nodule concerning for metastatic disease. 4. Increase in volume of right pleural effusion. Consider further evaluation with diagnostic thoracentesis if there is a concern for malignant effusion. 5. Aortic Atherosclerosis (ICD10-I70.0) and Emphysema (ICD10-J43.9). 6. Multi vessel coronary artery atherosclerotic calcifications.  DVT Prophylaxis  :  Lovenox    Lab Results  Component Value Date   PLT 266 02/09/2018    Diet :  Diet Order            DIET DYS 3 Room service appropriate? Yes; Fluid consistency: Thin  Diet effective now               Inpatient Medications Scheduled Meds: . albuterol  2.5 mg Nebulization TID  . aspirin EC  81 mg Oral Daily  . enoxaparin (LOVENOX) injection  40 mg Subcutaneous QHS  . feeding supplement (ENSURE ENLIVE)  237 mL Oral BID BM  . guaiFENesin  1,200 mg Oral BID  . insulin aspart  0-5 Units Subcutaneous QHS  . insulin aspart  0-9 Units Subcutaneous TID WC  . loratadine  10 mg Oral Daily  . metoprolol tartrate  25 mg Oral Once  . metoprolol tartrate  50 mg Oral BID  . pantoprazole  40 mg Oral BID AC  . potassium chloride  40 mEq Oral Once  . predniSONE  10 mg Oral Q breakfast   Continuous Infusions: . sodium chloride Stopped (02/10/18 1500)   PRN Meds:.ALPRAZolam, HYDROcodone-acetaminophen, nitroGLYCERIN, ondansetron, senna, simethicone, sodium chloride  Antibiotics  :   Anti-infectives (From admission, onward)   None          Objective:   Vitals:   02/10/18 0113 02/10/18 0413 02/10/18 0830 02/10/18 0845  BP: (!) 116/51 (!) 115/56  132/64  Pulse: (!) 116 (!) 116  (!) 120  Resp:  19  Temp:  98.2 F (36.8 C)    TempSrc:  Oral    SpO2:  99%  99%   Weight:      Height:        Wt Readings from Last 3 Encounters:  02/09/18 73.7 kg  01/27/18 76.3 kg  01/23/18 74.1 kg     Intake/Output Summary (Last 24 hours) at 02/10/2018 1124 Last data filed at 02/10/2018 0845 Gross per 24 hour  Intake 240 ml  Output -  Net 240 ml     Physical Exam  Awake Alert, Oriented X 3, No new F.N deficits, anxious affect [place].AT,PERRAL Supple Neck,No JVD, No cervical lymphadenopathy appriciated.  Symmetrical Chest wall movement, Good air movement bilaterally, reduced right-sided basilar breath sounds RRR,No Gallops, Rubs or new Murmurs, No Parasternal Heave +ve B.Sounds, Abd Soft, No tenderness, No organomegaly appriciated, No rebound - guarding or rigidity. No Cyanosis, Clubbing or edema, No new Rash or bruise       Data Review:    CBC Recent Labs  Lab 02/07/18 1108 02/08/18 1342 02/09/18 0419  WBC 12.3* 11.5* 9.7  HGB 10.7* 11.0* 9.8*  HCT 35.2* 35.8* 32.8*  PLT 280 282 266  MCV 96.2 94.7 93.4  MCH 29.2 29.1 27.9  MCHC 30.4 30.7 29.9*  RDW 14.8 14.5 14.6  LYMPHSABS 0.7 0.7  --   MONOABS 0.5 0.4  --   EOSABS 0.1 0.0  --   BASOSABS 0.0 0.0  --     Chemistries  Recent Labs  Lab 02/07/18 1108 02/08/18 1342 02/09/18 0419 02/10/18 0306  NA 142 139 140 137  K 3.8 4.0 3.4* 3.9  CL 102 103 103 101  CO2 29 28 30 27   GLUCOSE 159* 157* 91 108*  BUN 15 14 17  25*  CREATININE 1.14* 1.02* 1.21* 1.22*  CALCIUM 9.6 9.2 8.5* 8.9  MG  --   --   --  1.3*  AST 13* 16 14*  --   ALT 10 13 11   --   ALKPHOS 84 72 63  --   BILITOT 0.5 0.7 0.8  --    ------------------------------------------------------------------------------------------------------------------ Recent Labs    02/09/18 1604 02/10/18 0306  CHOL 153 173    Lab Results  Component Value Date   HGBA1C 5.8 (H) 12/10/2017   ------------------------------------------------------------------------------------------------------------------ No results for input(s):  TSH, T4TOTAL, T3FREE, THYROIDAB in the last 72 hours.  Invalid input(s): FREET3 ------------------------------------------------------------------------------------------------------------------ No results for input(s): VITAMINB12, FOLATE, FERRITIN, TIBC, IRON, RETICCTPCT in the last 72 hours.  Coagulation profile Recent Labs  Lab 02/08/18 2141  INR 0.96    No results for input(s): DDIMER in the last 72 hours.  Cardiac Enzymes No results for input(s): CKMB, TROPONINI, MYOGLOBIN in the last 168 hours.  Invalid input(s): CK ------------------------------------------------------------------------------------------------------------------    Component Value Date/Time   BNP 39.5 01/27/2018 1526    Micro Results Recent Results (from the past 240 hour(s))  Culture, blood (Routine x 2)     Status: None (Preliminary result)   Collection Time: 02/08/18  1:35 PM  Result Value Ref Range Status   Specimen Description BLOOD LEFT HAND  Final   Special Requests   Final    BOTTLES DRAWN AEROBIC AND ANAEROBIC Blood Culture adequate volume   Culture   Final    NO GROWTH 2 DAYS Performed at [facility], [address] [place], [place] [address]    Report Status PENDING  Incomplete  Culture, blood (Routine x 2)     Status: None (Preliminary  result)   Collection Time: 02/08/18  3:59 PM  Result Value Ref Range Status   Specimen Description BLOOD RIGHT ANTECUBITAL  Final   Special Requests   Final    BOTTLES DRAWN AEROBIC AND ANAEROBIC Blood Culture results may not be optimal due to an inadequate volume of blood received in culture bottles   Culture   Final    NO GROWTH 2 DAYS Performed at [facility] [address] [place], [place] [address]    Report Status PENDING  Incomplete  Body fluid culture     Status: None (Preliminary result)   Collection Time: 02/09/18  2:58 PM  Result Value Ref Range Status   Specimen Description THORACENTESIS  Final   Special Requests NONE   Final   Gram Stain   Final    RARE WBC PRESENT, PREDOMINANTLY MONONUCLEAR NO ORGANISMS SEEN Performed at [facility], [address] [place], [place] [address]    Culture PENDING  Incomplete   Report Status PENDING  Incomplete    Radiology Reports    Ct Chest Wo Contrast  Result Date: 02/07/2018 CLINICAL DATA:  Followup non-small cell lung cancer. EXAM: CT CHEST WITHOUT CONTRAST TECHNIQUE: Multidetector CT imaging of the chest was performed following the standard protocol without IV contrast. COMPARISON:  11/03/2017 FINDINGS: Cardiovascular: The heart size appears within normal limits. No pericardial effusion. Aortic atherosclerosis. Calcification within the LAD, left circumflex coronary arteries noted. Mediastinum/Nodes: Normal appearance of the thyroid gland. The trachea appears patent and is midline. Normal appearance of the esophagus. No enlarged axillary or supraclavicular lymph nodes. No mediastinal adenopathy identified. Within the limitations of unenhanced technique the previous right hilar mass measures 3.1 cm, image 59/2. Previously 3.0 cm. Lungs/Pleura: The lungs are emphysematous. There is a moderate partially loculated right pleural effusion which has increased in volume from the previous examination. Within the right upper lobe there is a large area of progressive masslike architectural distortion measuring 5.6 x 8.4 by 5.0 cm, image 66/2. Previously this appeared less solid measuring 7.5 by 5.2 by 3.9 cm. Surrounding ground-glass attenuation is noted which appears progressive from previous exam. There is new complete postobstructive atelectasis of the right middle lobe, image 81/5. Multiple new or enlarging nodules are identified within the right lower lobe. The largest nodule measures 1.8 cm, image 120/5. New from previous exam. Also new from previous exam is a 1.6 cm right lower lobe lung nodule, image 134/5. There are mild patchy areas of ground-glass attenuation within the  left lung. Upper Abdomen: No acute abnormality. Gallstone again noted. Small bilateral low-attenuation adrenal nodules are favored to represent adenomas. Unchanged. Musculoskeletal: Multiple chronic right posterior rib deformities are identified. Rib right humeral head unchanged compression deformities within the upper thoracic spine. Avascular necrosis. IMPRESSION: 1. Large masslike area of architectural distortion within the right upper lobe appears is increased in size and increasingly solid worrisome for tumor progression. 2. The right hilar mass is similar in size to previous exam but there is near new complete atelectasis and consolidation of the right middle lobe. 3. Progressive right lower lobe pulmonary nodule concerning for metastatic disease. 4. Increase in volume of right pleural effusion. Consider further evaluation with diagnostic thoracentesis if there is a concern for malignant effusion. 5. Aortic Atherosclerosis (ICD10-I70.0) and Emphysema (ICD10-J43.9). 6. Multi vessel coronary artery atherosclerotic calcifications. Electronically Signed   By: Kerby Moors M.D.   On: 02/07/2018 16:42   Dg Chest Port 1 View  Result Date: 02/09/2018 CLINICAL DATA:  Shortness of breath EXAM: PORTABLE CHEST 1 VIEW COMPARISON:  02/08/2018 FINDINGS: Increasing pulmonary opacity within the right upper lobe. Persistent right hilar density is noted consistent with the known underlying mass. Right-sided pleural effusion is seen. Left lung is clear. A skin fold lies laterally over the left lung. No bony abnormality is seen. Postsurgical changes are again noted. IMPRESSION: Increasing density in the right upper lobe likely representing some postobstructive change the right hilar mass is again noted and stable. Large right pleural effusion is again seen and stable. Electronically Signed   By: Inez Catalina M.D.   On: 02/09/2018 07:10     Time Spent in minutes  30   Lala Lund M.D on 02/10/2018 at 11:24 AM  To  page go to [email or web address] - password [facility]

## 2018-02-11 ENCOUNTER — Inpatient Hospital Stay (HOSPITAL_COMMUNITY): Payer: Medicare Other

## 2018-02-11 DIAGNOSIS — Z515 Encounter for palliative care: Secondary | ICD-10-CM

## 2018-02-11 DIAGNOSIS — F411 Generalized anxiety disorder: Secondary | ICD-10-CM

## 2018-02-11 DIAGNOSIS — Z7189 Other specified counseling: Secondary | ICD-10-CM

## 2018-02-11 DIAGNOSIS — R0600 Dyspnea, unspecified: Secondary | ICD-10-CM

## 2018-02-11 LAB — RHEUMATOID FACTORS, FLUID: RHEUMATOID ARTHRITIS, QN/FLUID: NEGATIVE

## 2018-02-11 LAB — GLUCOSE, CAPILLARY: GLUCOSE-CAPILLARY: 132 mg/dL — AB (ref 70–99)

## 2018-02-11 MED ORDER — OXYCODONE HCL 5 MG PO TABS: 5.0000 mg | ORAL_TABLET | ORAL | Status: DC | PRN

## 2018-02-11 MED ORDER — ALPRAZOLAM 0.25 MG PO TABS: 0.2500 mg | ORAL_TABLET | Freq: Three times a day (TID) | ORAL | Status: DC | PRN

## 2018-02-11 MED ORDER — CETIRIZINE HCL 10 MG PO CAPS: 1.0000 | ORAL_CAPSULE | Freq: Every evening | ORAL | 0 refills | Status: DC | PRN

## 2018-02-11 MED ORDER — BENZONATATE 100 MG PO CAPS: 100.0000 mg | ORAL_CAPSULE | Freq: Four times a day (QID) | ORAL | 0 refills | Status: DC | PRN

## 2018-02-11 MED ORDER — ALBUTEROL SULFATE (2.5 MG/3ML) 0.083% IN NEBU: 2.5000 mg | INHALATION_SOLUTION | Freq: Four times a day (QID) | RESPIRATORY_TRACT | Status: DC | PRN

## 2018-02-11 MED ORDER — ALPRAZOLAM 0.5 MG PO TABS
0.5000 mg | ORAL_TABLET | Freq: Two times a day (BID) | ORAL | Status: DC
  Administered 2018-02-11: 0.5 mg via ORAL
  Filled 2018-02-11: qty 1

## 2018-02-11 MED ORDER — GUAIFENESIN-DM 100-10 MG/5ML PO SYRP: 5.0000 mL | ORAL_SOLUTION | ORAL | 0 refills | Status: DC | PRN

## 2018-02-11 NOTE — Progress Notes (Signed)
Nsg Discharge Note  Admit Date:  02/08/2018 Discharge date: 02/11/2018   [name] to be D/C'd Home per MD order.  AVS completed.  Copy for chart, and copy for patient signed, and dated. Patient/caregiver able to verbalize understanding.  Discharge Medication: Allergies as of 02/11/2018      Reactions   Iohexol Shortness Of Breath, Other (See Comments)   "burns me up inside"    Pt does fine with 13 hour prep  01/17/12   Other    CAN'T STAND THE SMELL OF PURE BLEACH; "HAVE TO BACK UP AND POUR AND DON'T BREATH IT TIL i GET CAP BACK ON; DILUTE IT TO SPRAY"   Anoro Ellipta [umeclidinium-vilanterol] Swelling   Swelling of lips and mouth   Codeine Itching, Nausea And Vomiting   REACTION: GI upset   Protonix [pantoprazole Sodium] Diarrhea      Medication List    STOP taking these medications   Guaifenesin 1200 MG Tb12     TAKE these medications   ACCU-CHEK AVIVA PLUS test strip Generic drug:  glucose blood USE AS DIRECTED TO CHECK BLOOD SUGAR THREE TIMES DAILY   ACCU-CHEK AVIVA PLUS w/Device Kit Use as directed to check blood sugar Dx: E11.9, E11.49   albuterol 108 (90 Base) MCG/ACT inhaler Commonly known as:  PROVENTIL HFA;VENTOLIN HFA Inhale 2 puffs into the lungs every 6 (six) hours as needed for wheezing or shortness of breath. What changed:  Another medication with the same name was changed. Make sure you understand how and when to take each.   albuterol (2.5 MG/3ML) 0.083% nebulizer solution Commonly known as:  PROVENTIL USE 1 VIAL VIA NEBULIZER EVERY 6 HOURS AS NEEDED FOR WHEEZING OR SHORTNESS OF BREATH What changed:  See the new instructions.   ALPRAZolam 0.5 MG tablet Commonly known as:  XANAX TAKE 1 TABLET BY MOUTH 3 TIMES A DAY AS NEEDED FOR ANXIETY What changed:    how much to take  how to take this  when to take this  reasons to take this  additional instructions   aspirin 81 MG tablet Take 81 mg by mouth daily.   benzonatate 100 MG  capsule Commonly known as:  TESSALON Take 1 capsule (100 mg total) by mouth every 6 (six) hours as needed for cough.   CALTRATE PLUS PO Take 1 tablet by mouth every other day.   Cetirizine HCl 10 MG Caps Take 1 capsule (10 mg total) by mouth at bedtime and may repeat dose one time if needed.   FLUTTER Devi Use a directed   furosemide 40 MG tablet Commonly known as:  LASIX Take 1 tablet (40 mg total) by mouth daily.   guaiFENesin-dextromethorphan 100-10 MG/5ML syrup Commonly known as:  ROBITUSSIN DM Take 5 mLs by mouth every 4 (four) hours as needed for cough.   HYDROcodone-acetaminophen 10-325 MG tablet Commonly known as:  NORCO Take 1 tablet by mouth 2 (two) times daily as needed. What changed:    when to take this  reasons to take this   loratadine 10 MG tablet Commonly known as:  CLARITIN Take 1 tablet (10 mg total) by mouth daily.   metFORMIN 500 MG tablet Commonly known as:  GLUCOPHAGE Take 1 tablet (500 mg total) by mouth 2 (two) times daily.   metoprolol tartrate 25 MG tablet Commonly known as:  LOPRESSOR Take 1 tablet (25 mg total) by mouth 2 (two) times daily.   nitroGLYCERIN 0.4 MG SL tablet Commonly known as:  NITROSTAT Place 1  tablet (0.4 mg total) under the tongue every 5 (five) minutes as needed. What changed:  reasons to take this   ondansetron 4 MG tablet Commonly known as:  ZOFRAN Take 1 tablet (4 mg total) by mouth every 8 (eight) hours as needed for nausea or vomiting.   OXYGEN Inhale 3-4 L into the lungs.   pantoprazole 40 MG tablet Commonly known as:  PROTONIX Take 1 tablet (40 mg total) by mouth 2 (two) times daily before a meal.   potassium chloride 10 MEQ tablet Commonly known as:  K-DUR,KLOR-CON Take 1 tablet (10 mEq total) by mouth daily.   predniSONE 10 MG tablet Commonly known as:  DELTASONE Take 1 tablet (10 mg total) by mouth daily with breakfast.   senna 8.6 MG tablet Commonly known as:  SENOKOT Take 1 tablet by mouth  2 (two) times daily as needed for constipation.   simethicone 80 MG chewable tablet Commonly known as:  MYLICON Chew 1 tablet (80 mg total) by mouth 4 (four) times daily as needed for flatulence.   sodium chloride 0.65 % Soln nasal spray Commonly known as:  OCEAN Place 1 spray into both nostrils as needed for congestion.   traMADol 50 MG tablet Commonly known as:  ULTRAM TAKE 2 TABLETS (100MG TOTAL) BY MOUTH EVERY 6 HOURS AS NEEDED FOR SEVERE PAIN What changed:  See the new instructions.   TRELEGY ELLIPTA 100-62.5-25 MCG/INH Aepb Generic drug:  Fluticasone-Umeclidin-Vilant INHALE 1 PUFF BY MOUTH ONCE DAILY What changed:  See the new instructions.   valsartan 40 MG tablet Commonly known as:  DIOVAN Take 1 tablet (40 mg total) by mouth daily.       Discharge Assessment: Vitals:   02/11/18 0829 02/11/18 0928  BP:  (!) 152/48  Pulse:  (!) 110  Resp:    Temp:    SpO2: 100%    Skin clean, dry and intact without evidence of skin break down, no evidence of skin tears noted. IV catheter discontinued intact. Site without signs and symptoms of complications - no redness or edema noted at insertion site, patient denies c/o pain - only slight tenderness at site.  Dressing with slight pressure applied.  D/c Instructions-Education: Discharge instructions given to patient/family with verbalized understanding. D/c education completed with patient/family including follow up instructions, medication list, d/c activities limitations if indicated, with other d/c instructions as indicated by MD - patient able to verbalize understanding, all questions fully answered. Patient instructed to return to ED, call 911, or call MD for any changes in condition.  Patient escorted via [place], and D/C home via private auto.  [place] N Siaosi Alter, RN 02/11/2018 12:43 PM

## 2018-02-11 NOTE — Discharge Instructions (Signed)
Follow with Primary MD [name], DO in 7 days   Activity: As tolerated with Full fall precautions use walker/cane & assistance as needed  Disposition Home    Diet: Soft Heart Healthy and 1.5 lit/day fluid restriction.  Special Instructions: If you have smoked or chewed Tobacco  in the last 2 yrs please stop smoking, stop any regular Alcohol  and or any Recreational drug use.  On your next visit with your primary care physician please Get Medicines reviewed and adjusted.  Please request your Prim.MD to go over all Hospital Tests and Procedure/Radiological results at the follow up, please get all Hospital records sent to your Prim MD by signing hospital release before you go home.  If you experience worsening of your admission symptoms, develop shortness of breath, life threatening emergency, suicidal or homicidal thoughts you must seek medical attention immediately by calling 911 or calling your MD immediately  if symptoms less severe.  You Must read complete instructions/literature along with all the possible adverse reactions/side effects for all the Medicines you take and that have been prescribed to you. Take any new Medicines after you have completely understood and accpet all the possible adverse reactions/side effects.

## 2018-02-11 NOTE — Consult Note (Signed)
Consultation Note Date: 02/11/2018   Patient Name: [name]  DOB: 1945/10/21  MRN: [id number]  Age / Sex: 72 y.o., female  PCP: [name], DO Referring Physician: Thurnell Lose, MD  Reason for Consultation: Establishing goals of care  HPI/Patient Profile: 72 y.o. female  with past medical history of chronic respiratory failure with COPD on home oxygen 3L, non-small cell lung cancer, diabetes mellitus, atrial fibrillation, diastolic heart failure, fibromyalgia, GERD, DVT admitted on 02/08/2018 with shortness of breath and weakness. Recent admission at [facility] for pneumonia. CT chest revealed large RUL mass, right pleural effusion, left lung nodule, and multiple progressive RLL nodules. Underwent ultrasound-guided thoracentesis on 10/31, pathology pending. Palliative medicine consultation for goals of care.   Clinical Assessment and Goals of Care:  I have reviewed medical records, discussed with care team, and met with patient at bedside to discuss diagnosis, prognosis, GOC, EOL wishes, disposition and options. Patient is awake, alert, oriented. Mildly anxious.   Introduced Palliative Medicine as specialized medical care for people living with serious illness. It focuses on providing relief from the symptoms and stress of a serious illness. The goal is to improve quality of life for both the patient and the family.  We discussed a brief life review of the patient. About three months ago, she moved in with her daughter. She has functionally become weaker and requiring assist with ADL's. Medical equipment in the home. Appetite fair.   Discussed events leading up to hospitalization and course of hospital diagnoses and interventions. Patient understands her CT scan reveals progressive cancer in her lungs. We discussed her being too weak for aggressive chemotherapy. Discussed concern with worsening  respiratory failure secondary to cancer, COPD, and CHF contributing to recurrent hospitalization.   I attempted to elicit values and goals of care important to the patient. Patient initially tells me she wants to "survive" and then continues on to share she doesn't want her chest "pressed" on and ribs cracked. Patient confirms her wishes with DNR code status. Later on in the conversation she shares that she just wants "peace." She is tired with coming back and forth to the hospital (three times in three weeks).   The difference between aggressive medical intervention and comfort care was considered in light of the patient's goals of care.   Discussed at length outpatient palliative versus hospice options. Educated on hospice philosophy and role of comfort, quality of life, and dignity. Explained role of symptom management to prevent re-current hospitalization. Patient calls her daughter while I am in the room. When off the phone, she then shares that her son and daughter want her to try and receive more chemotherapy. Today, she is firm on her decision for comfort approach and hospice services, again explaining that she understands she is too weak for chemotherapy. She wishes to start hospice services at home on discharge. She also has support through caregivers provided by [employer].   Discussed her symptoms of anxiety, pain, and dyspnea. Discussed scheduled xanax to stay on top of  the anxiety. Discussed starting prn oxycodone to not only assist with pain management but help with dyspnea. Patient agreeable with this plan. She will likely discharge today, so I explained that I would write my recommendation in note and discuss with Dr. Candiss Norse but this medication will not be started until hospice enrollment is initiated.   Therapeutic listening as patient shares her thoughts and understanding of poor prognosis. Spiritual support given.   Questions and concerns were addressed. PMT contact information  given. Patient is eager to get home today.   SUMMARY OF RECOMMENDATIONS    DNR code status.   Patient wishes to focus on her comfort and symptom management. She is tired of frequent hospital admissions.   Discussed at length outpatient palliative versus hospice options. Discussed hospice philosophy and role of comfort and symptom management. Discussed prognosis. Patient wishes to initiate hospice services at home on discharge. She is hopeful for discharge today. She understands hospice will likely not admit her at home until Sunday or Monday.  Symptom management--see below.    Code Status/Advance Care Planning:  DNR  Symptom Management:   Xanax 0.29m PO BID anxiety  PRN Xanax 0.274mTID prn breakthrough anxiety  Oxycodone 74m84mO q4h prn pain/dyspnea  Senna scheduled  Palliative Prophylaxis:   Bowel Regimen, Delirium Protocol, Frequent Pain Assessment and Turn Reposition   Psycho-social/Spiritual:   Desire for further Chaplaincy support:yes  Additional Recommendations: Caregiving  Support/Resources and Education on Hospice  Prognosis:   Likely less than 6 months and eligible for hospice services with chronic respiratory failure secondary to lung cancer, COPD, CHF, and pleural effusion.  Discharge Planning: Home with Hospice      Primary Diagnoses: Present on Admission: . Lung cancer ([facility] Anxiety and depression . Chronic diastolic heart failure ([place] Chronic pain . COPD (chronic obstructive pulmonary disease) ([place]CC[place] DM type 2 with diabetic peripheral neuropathy ([place] I have reviewed the medical record, interviewed the patient and family, and examined the patient. The following aspects are pertinent.  Past Medical History:  Diagnosis Date  . Adenocarcinoma, lung ([facility]  upper left lobe  . Anemia in neoplastic disease   . Anxiety   . Arthritis    "all over my body"  . Asthma   . Asymptomatic varicose veins   . Atherosclerosis of native arteries  of the extremities, unspecified   . Atrial fibrillation ([facility] . Bronchial pneumonia    history  . 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 Chemotherapy adverse reaction 09/15/11   "makes me light headed and pass out; this is 3rd blood transfusion since going thru it"  . CHF (congestive heart failure) ([facility] . Chronic airway obstruction, not elsewhere classified   . Chronic fatigue 08/19/2015  . Chronic sinusitis   . Complication of anesthesia    slow to wake up  . COPD with emphysema ([place] . Coronary artery disease    Dr. CooBurt Knackd stress test done  . DDD (degenerative disc disease), lumbar   . Depressive disorder, not elsewhere classified   . Diseases of lips   . Disorder of bone and cartilage, unspecified   . DVT (deep venous thrombosis) (HCC[place] . Fibromyalgia   . Functional urinary incontinence   . GERD (gastroesophageal reflux disease)   . Hard of hearing, right   . Herpes zoster without mention of complication   . Hip pain, chronic 09/17/2014  . History of blood clots    "  2 in my aorta"  . History of blood transfusion   . History of bronchitis   . History of echocardiogram    Echo 11/18: mild LVH, EF 60-65, no RWMA, Gr 1 DD, Ao Sclerosis, mild AI, RVH with low nl RSVF, trivial eff  . Hx of radiation therapy 04/28/11 -06/08/11   right upper lobe-lung  . Hyperlipidemia   . Hypertension   . Kidney infection    "related to chemo"  . Lumbago   . Malignant neoplasm of bronchus and lung, unspecified site   . Mental disorder   . Muscle weakness (generalized)   . Non-small cell lung cancer ([place]) 03/31/2011   Adenocarcinoma Rt upper lobe mass  . Osteoarthrosis, unspecified whether generalized or localized, unspecified site   . Osteoporosis   . Other abnormal blood chemistry   . Other and unspecified hyperlipidemia   . PAD (peripheral artery disease) ([place])   . Pain in thoracic spine   . 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 Primary cancer of right upper lobe of lung  ([place]) 02/08/2011   Biopsy Rt upper lobe mass 03/31/11>>adenocarcinoma DIAGNOSIS: Stage IIB/IIIA non-small cell lung cancer consistent with adenocarcinoma diagnosed in December 2012.   PRIOR THERAPY:  1) Status post radiation therapy to the right upper lobe lung mass completed on 06/04/2011 under the care of Dr. Pablo Ledger.  2) Systemic chemotherapy with carboplatin for AUC of 5 and Alimta 500 mg/M2 giving every 3   . Reflux   . Reflux esophagitis   . Respiratory failure with hypoxia 01/02/2010  . Restless legs syndrome (RLS)   . Scoliosis   . Screening for thyroid disorder   . Shortness of breath    "anytime really"  . Sinus headache    "all the time"  . Spasm of muscle   . Syncope and collapse   . Type II or unspecified type diabetes mellitus with neurological manifestations, uncontrolled(250.62)   . Type II or unspecified type diabetes mellitus without mention of complication, not stated as uncontrolled   . Type II or unspecified type diabetes mellitus without mention of complication, not stated as uncontrolled   . Unspecified constipation   . Unspecified essential hypertension   . Unspecified hearing loss   . Unspecified hereditary and idiopathic peripheral neuropathy   . Unspecified vitamin D deficiency    Social History   Socioeconomic History  . Marital status: Divorced    Spouse name: Not on file  . Number of children: 3  . Years of education: Not on file  . Highest education level: Not on file  Occupational History  . Occupation: disabled  . Occupation: RETIRED    Employer: RETIRED  Social Needs  . Financial resource strain: Not on file  . Food insecurity:    Worry: Not on file    Inability: Not on file  . Transportation needs:    Medical: Not on file    Non-medical: Not on file  Tobacco Use  . Smoking status: Former Smoker    Packs/day: 1.00    Years: 40.00    Pack years: 40.00    Types: Cigarettes    Last attempt to quit: 02/10/1998    Years since quitting:  20.0  . Smokeless tobacco: Never Used  Substance and Sexual Activity  . Alcohol use: No  . Drug use: No  . Sexual activity: Never  Lifestyle  . Physical activity:    Days per week: Not on file    Minutes per session: Not on file  .  Stress: Not on file  Relationships  . Social connections:    Talks on phone: Not on file    Gets together: Not on file    Attends religious service: Not on file    Active member of club or organization: Not on file    Attends meetings of clubs or organizations: Not on file    Relationship status: Not on file  Other Topics Concern  . Not on file  Social History Narrative  . Not on file   Family History  Problem Relation Age of Onset  . Bone cancer Father   . Cancer Father        Bone  . Diabetes Mother   . Deep vein thrombosis Mother   . Heart disease Mother        Heart Disease before age 46-  PVD  . Hyperlipidemia Mother   . Hypertension Mother   . Varicose Veins Mother   . Arthritis Daughter    Scheduled Meds: . albuterol  2.5 mg Nebulization TID  . ALPRAZolam  0.5 mg Oral BID  . aspirin EC  81 mg Oral Daily  . enoxaparin (LOVENOX) injection  40 mg Subcutaneous QHS  . feeding supplement (ENSURE ENLIVE)  237 mL Oral BID BM  . guaiFENesin  1,200 mg Oral BID  . insulin aspart  0-5 Units Subcutaneous QHS  . insulin aspart  0-9 Units Subcutaneous TID WC  . loratadine  10 mg Oral Daily  . metoprolol tartrate  50 mg Oral BID  . pantoprazole  40 mg Oral BID AC  . predniSONE  10 mg Oral Q breakfast   Continuous Infusions: . sodium chloride Stopped (02/10/18 1500)   PRN Meds:.albuterol, ALPRAZolam, guaiFENesin-dextromethorphan, nitroGLYCERIN, ondansetron, oxyCODONE, senna, simethicone, sodium chloride Medications Prior to Admission:  Prior to Admission medications   Medication Sig Start Date End Date Taking? Authorizing Provider  albuterol (PROVENTIL HFA;VENTOLIN HFA) 108 (90 Base) MCG/ACT inhaler Inhale 2 puffs into the lungs every 6  (six) hours as needed for wheezing or shortness of breath. 06/01/17  Yes Collene Gobble, MD  albuterol (PROVENTIL) (2.5 MG/3ML) 0.083% nebulizer solution USE 1 VIAL VIA NEBULIZER EVERY 6 HOURS AS NEEDED FOR WHEEZING OR SHORTNESS OF BREATH Patient taking differently: Take 2.5 mg by nebulization every 6 (six) hours as needed for wheezing or shortness of breath.  08/03/17  Yes Byrum, Rose Fillers, MD  ALPRAZolam Duanne Moron) 0.5 MG tablet TAKE 1 TABLET BY MOUTH 3 TIMES A DAY AS NEEDED FOR ANXIETY Patient taking differently: Take 0.5 mg by mouth 3 (three) times daily as needed for anxiety.  01/30/18  Yes Eugenie Filler, MD  aspirin 81 MG tablet Take 81 mg by mouth daily.   Yes [provider]  Calcium Carbonate-Vit D-Min (CALTRATE PLUS PO) Take 1 tablet by mouth every other day.    Yes [provider]  furosemide (LASIX) 40 MG tablet Take 1 tablet (40 mg total) by mouth daily. 01/23/18 02/22/18 Yes Weaver, Scott T, PA-C  Guaifenesin 1200 MG TB12 Take 1 tablet (1,200 mg total) by mouth 2 (two) times daily. 11/30/17  Yes Collene Gobble, MD  HYDROcodone-acetaminophen (NORCO) 10-325 MG tablet Take 1 tablet by mouth 2 (two) times daily as needed. Patient taking differently: Take 1 tablet by mouth every 6 (six) hours as needed for moderate pain.  01/12/18  Yes Collene Gobble, MD  loratadine (CLARITIN) 10 MG tablet Take 1 tablet (10 mg total) by mouth daily. 01/31/18  Yes Eugenie Filler,  MD  metFORMIN (GLUCOPHAGE) 500 MG tablet Take 1 tablet (500 mg total) by mouth 2 (two) times daily. 12/15/17  Yes Hongalgi, Lenis Dickinson, MD  metoprolol tartrate (LOPRESSOR) 25 MG tablet Take 1 tablet (25 mg total) by mouth 2 (two) times daily. 01/23/18  Yes Weaver, Scott T, PA-C  nitroGLYCERIN (NITROSTAT) 0.4 MG SL tablet Place 1 tablet (0.4 mg total) under the tongue every 5 (five) minutes as needed. Patient taking differently: Place 0.4 mg under the tongue every 5 (five) minutes as needed for chest pain.  01/23/18   Yes Weaver, Scott T, PA-C  ondansetron (ZOFRAN) 4 MG tablet Take 1 tablet (4 mg total) by mouth every 8 (eight) hours as needed for nausea or vomiting. 01/16/18  Yes Martyn Ehrich, NP  OXYGEN Inhale 3-4 L into the lungs.    Yes [provider]  pantoprazole (PROTONIX) 40 MG tablet Take 1 tablet (40 mg total) by mouth 2 (two) times daily before a meal. 01/30/18  Yes Eugenie Filler, MD  potassium chloride (K-DUR,KLOR-CON) 10 MEQ tablet Take 1 tablet (10 mEq total) by mouth daily. 01/23/18  Yes Weaver, Scott T, PA-C  predniSONE (DELTASONE) 10 MG tablet Take 1 tablet (10 mg total) by mouth daily with breakfast. 01/13/18  Yes Martyn Ehrich, NP  senna (SENOKOT) 8.6 MG tablet Take 1 tablet by mouth 2 (two) times daily as needed for constipation.    Yes [provider]  simethicone (MYLICON) 80 MG chewable tablet Chew 1 tablet (80 mg total) by mouth 4 (four) times daily as needed for flatulence. 01/30/18  Yes Eugenie Filler, MD  sodium chloride (OCEAN) 0.65 % SOLN nasal spray Place 1 spray into both nostrils as needed for congestion. 01/30/18  Yes Eugenie Filler, MD  traMADol (ULTRAM) 50 MG tablet TAKE 2 TABLETS (100MG TOTAL) BY MOUTH EVERY 6 HOURS AS NEEDED FOR SEVERE PAIN Patient taking differently: Take 100 mg by mouth every 6 (six) hours as needed for moderate pain.  07/25/17  Yes [name], MD  TRELEGY ELLIPTA 100-62.5-25 MCG/INH AEPB INHALE 1 PUFF BY MOUTH ONCE DAILY Patient taking differently: Inhale 1 puff into the lungs daily.  09/26/17  Yes Collene Gobble, MD  valsartan (DIOVAN) 40 MG tablet Take 1 tablet (40 mg total) by mouth daily. 01/23/18  Yes Weaver, Scott T, PA-C  ACCU-CHEK AVIVA PLUS test strip USE AS DIRECTED TO CHECK BLOOD SUGAR THREE TIMES DAILY 11/14/17   [name], MD  Blood Glucose Monitoring Suppl (ACCU-CHEK AVIVA PLUS) w/Device KIT Use as directed to check blood sugar Dx: E11.9, E11.49 04/21/16   Estill Dooms, MD  Respiratory Therapy  Supplies (FLUTTER) DEVI Use a directed 11/08/17   Martyn Ehrich, NP  calcium carbonate 200 MG capsule Take 250 mg by mouth daily. Does not take everyday  01/23/18  [provider]   Allergies  Allergen Reactions  . Iohexol Shortness Of Breath and Other (See Comments)    "burns me up inside"    Pt does fine with 13 hour prep  01/17/12  . Other     CAN'T STAND THE SMELL OF PURE BLEACH; "HAVE TO BACK UP AND POUR AND DON'T BREATH IT TIL i GET CAP BACK ON; DILUTE IT TO SPRAY"  . Anoro Ellipta [Umeclidinium-Vilanterol] Swelling    Swelling of lips and mouth  . Codeine Itching and Nausea And Vomiting    REACTION: GI upset  . Protonix [Pantoprazole Sodium] Diarrhea   Review of Systems  Constitutional: Positive for activity change and fatigue.  Respiratory: Positive for cough, shortness of breath and wheezing.   Neurological: Positive for weakness.   Physical Exam  Constitutional: She is oriented to person, place, and time. She is cooperative.  HENT:  Head: Normocephalic and atraumatic.  Cardiovascular: Regular rhythm.  Pulmonary/Chest: No accessory muscle usage. No tachypnea. No respiratory distress.  Home oxygen 3L  Abdominal: There is no tenderness.  Neurological: She is alert and oriented to person, place, and time.  Skin: Skin is warm and dry.  Psychiatric: Her speech is normal and behavior is normal. Her mood appears anxious. Cognition and memory are normal.  Nursing note and vitals reviewed.  Vital Signs: BP (!) 131/51 (BP Location: Right Arm)   Pulse (!) 115   Temp 98.1 F (36.7 C) (Oral)   Resp 18   Ht _0  (1.676 m)   Wt 73.7 kg   SpO2 100%   BMI 26.22 kg/m  Pain Scale: 0-10   Pain Score: 0-No pain   SpO2: SpO2: 100 % O2 Device:SpO2: 100 % O2 Flow Rate: .O2 Flow Rate (L/min): 3 L/min  IO: Intake/output summary:   Intake/Output Summary (Last 24 hours) at 02/11/2018 2119 Last data filed at 02/11/2018 0800 Gross per 24 hour  Intake 200 ml  Output -    Net 200 ml    LBM: Last BM Date: 02/11/18 Baseline Weight: Weight: 73.7 kg Most recent weight: Weight: 73.7 kg     Palliative Assessment/Data: PPS 40%   Flowsheet Rows     Most Recent Value  Intake Tab  Referral Department  Hospitalist  Unit at Time of Referral  Med/Surg Unit  Palliative Care Primary Diagnosis  Cancer  Palliative Care Type  New Palliative care  Reason for referral  Clarify Goals of Care  Date first seen by Palliative Care  02/11/18  Clinical Assessment  Palliative Performance Scale Score  40%  Psychosocial & Spiritual Assessment  Palliative Care Outcomes  Patient/Family meeting held?  Yes  Who was at the meeting?  patient  Palliative Care Outcomes  Clarified goals of care, Counseled regarding hospice, Provided psychosocial or spiritual support, ACP counseling assistance, Provided end of life care assistance, Improved pain interventions, Improved non-pain symptom therapy      Time In: 0815 Time Out: 0940 Time Total: 38mn Greater than 50%  of this time was spent counseling and coordinating care related to the above assessment and plan.  Signed by:  MIhor Dow FNP-C Palliative Medicine Team  Phone: [phone]: [phone]  Please contact Palliative Medicine Team phone at [phone] questions and concerns.  For individual provider: See AShea Evans

## 2018-02-11 NOTE — Progress Notes (Signed)
Pt requesting medication for worsening cough. States she feels she is "smothering" in phlegm. Contacted [place], NP x 2. Awaiting new orders.

## 2018-02-11 NOTE — Care Management Note (Signed)
Case Management Note  Patient Details  Name: [name] MRN: [id number] Date of Birth: [birth year]  Subjective/Objective:                    Action/Plan:  Notified by Palliative NP that patient would like to DC to home w Home Hospice. She has been linked with [facility] and [facility]. She was agreeable to [facility], and referral made to clinical liaison Lattie Haw.  Patient has home oxygen, hospital bed, RW, WC, no DME needs. Son will provide transport home and is bringing oxygen for transport. Patient ok to DC to home, anticipate home hospice nurse to see patient today or tomorrow.  No other CM needs, signing off.  Expected Discharge Date:                  Expected Discharge Plan:  Home w Hospice Care([place]/ [facility], M -Sat (17 hrs/week). 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, dates discussed:    Additional Comments:  Carles Collet, RN 02/11/2018, 11:10 AM

## 2018-02-11 NOTE — Discharge Summary (Signed)
[name] WKM:[id number] DOB: 1945/05/08 DOA: 02/08/2018  PCP: [name], DO  Admit date: 02/08/2018  Discharge date: 02/11/2018  Admitted From: Home  Disposition: Home with hospice   Recommendations for Outpatient Follow-up:   Follow up with PCP in 1-2 weeks  PCP Please obtain BMP/CBC, 2 view CXR in 1week,  (see Discharge instructions)   PCP Please follow up on the following pending results: Pleural fluid cytology   Home Health: Home hospice Equipment/Devices: None Consultations: PCCM and palliative care Discharge Condition: Fair CODE STATUS: DNR Diet Recommendation: Heart Healthy(soft) 1.5 L/day total fluid restriction   Chief Complaint  Patient presents with  . 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  She was seen by pulmonary and we were requested to admit the patient.                                                                 Hospital Course    1.  Acute on chronic hypoxic respiratory failure.  She is already on home oxygen, appears to be in no distress, she had mild decompensation was most likely due to some element of acute on chronic diastolic CHF, she has underlying right-sided lung cancer with right-sided  atelectasis and moderate to large right-sided pleural effusion.  She is very minimally decompensated and close to her baseline however I think her issues mostly a psychological and she is afraid to go home, she is home hospice status and is also followed by oncologist [name] and pulmonologist [name].  She still does not feel confident going home and states that she will come right back, I think she has still not reconciled with her medical situation.  She does have recurrent right-sided pleural effusion and underwent ultrasound-guided thoracentesis by pulmonary on 02/09/2018 yielding exudative fluid, cytology is pending, previously this fluid was transudative, repeat two-view chest x-ray shows right-sided mass but overall effusion is minimal, he was seen by pulmonary along with palliative care, after detailed discussions with patient and different teams it was decided that she would be best served with home with hospice which she chose, goal of care now is comfort, she can continue palliative chemotherapy or radiation.  Will follow with home hospice along with her oncologist and pulmonologist after discharge.   2.  Acute on chronic  diastolic CHF 61% on recent echocardiogram.    With IV Lasix and beta-blocker, back to baseline, resume home dose Lasix and potassium supplementation along with fluid restriction at the time of discharge.  3.  Right-sided non-small cell lung cancer with right lung mets, hilar involvement with right-sided lung collapse/atelectasis and right-sided pleural effusion.  This effusion was recently tapped and it was a transudate, see discussion above.  Pulmonary team involved.  Follow with [name] oncologist post discharge.  Monitor cytology which is still pending at the time of discharge from thoracentesis done this admission on 02/09/2018, fluid now seems to be transudative.  4.  Deconditioning.  PT eval. now deciding to go with home hospice.  5.  Essential  hypertension.    Home regimen continued along with home dose Lasix.  6.  Chronic anxiety and chronic pain.  Home regimen continued.  7.  DM type II.    Continue home regimen post discharge   Discharge diagnosis     Active Problems:   COPD (chronic obstructive pulmonary disease) (HCC)   SOB (shortness of breath)   Chronic diastolic heart failure (HCC)   DM type 2 with diabetic peripheral neuropathy (HCC)   Anxiety and depression   Chronic pain   Lung cancer ([place])   S/P thoracentesis    Discharge instructions    Discharge Instructions    Discharge instructions   Complete by:  As directed    Follow with Primary MD [name], DO in 7 days   Activity: As tolerated with Full fall precautions use walker/cane & assistance as needed  Disposition Home    Diet: Soft Heart Healthy and 1.5 lit/day fluid restriction.  Special Instructions: If you have smoked or chewed Tobacco  in the last 2 yrs please stop smoking, stop any regular Alcohol  and or any Recreational drug use.  On your next visit with your primary care physician please Get Medicines reviewed and adjusted.  Please request your Prim.MD to go over all Hospital Tests and Procedure/Radiological results at the follow up, please get all Hospital records sent to your Prim MD by signing hospital release before you go home.  If you experience worsening of your admission symptoms, develop shortness of breath, life threatening emergency, suicidal or homicidal thoughts you must seek medical attention immediately by calling 911 or calling your MD immediately  if symptoms less severe.  You Must read complete instructions/literature along with all the possible adverse reactions/side effects for all the Medicines you take and that have been prescribed to you. Take any new Medicines after you have completely understood and accpet all the possible adverse reactions/side effects.   Increase activity slowly   Complete by:  As directed         Discharge Medications   Allergies as of 02/11/2018      Reactions   Iohexol Shortness Of Breath, Other (See Comments)   "burns me up inside"    Pt does fine with 13 hour prep  01/17/12   Other    CAN'T STAND THE SMELL OF PURE BLEACH; "HAVE TO BACK UP AND POUR AND DON'T BREATH IT TIL i GET CAP BACK ON; DILUTE IT TO SPRAY"   Anoro Ellipta [umeclidinium-vilanterol] Swelling   Swelling of lips and mouth   Codeine Itching, Nausea And Vomiting   REACTION: GI upset   Protonix [pantoprazole Sodium] Diarrhea      Medication List    STOP taking these medications   Guaifenesin 1200 MG Tb12  TAKE these medications   ACCU-CHEK AVIVA PLUS test strip Generic drug:  glucose blood USE AS DIRECTED TO CHECK BLOOD SUGAR THREE TIMES DAILY   ACCU-CHEK AVIVA PLUS w/Device Kit Use as directed to check blood sugar Dx: E11.9, E11.49   albuterol 108 (90 Base) MCG/ACT inhaler Commonly known as:  PROVENTIL HFA;VENTOLIN HFA Inhale 2 puffs into the lungs every 6 (six) hours as needed for wheezing or shortness of breath. What changed:  Another medication with the same name was changed. Make sure you understand how and when to take each.   albuterol (2.5 MG/3ML) 0.083% nebulizer solution Commonly known as:  PROVENTIL USE 1 VIAL VIA NEBULIZER EVERY 6 HOURS AS NEEDED FOR WHEEZING OR SHORTNESS OF BREATH What changed:  See the new instructions.   ALPRAZolam 0.5 MG tablet Commonly known as:  XANAX TAKE 1 TABLET BY MOUTH 3 TIMES A DAY AS NEEDED FOR ANXIETY What changed:    how much to take  how to take this  when to take this  reasons to take this  additional instructions   aspirin 81 MG tablet Take 81 mg by mouth daily.   benzonatate 100 MG capsule Commonly known as:  TESSALON Take 1 capsule (100 mg total) by mouth every 6 (six) hours as needed for cough.   CALTRATE PLUS PO Take 1 tablet by mouth every other day.   Cetirizine HCl 10 MG Caps Take 1 capsule (10 mg total) by mouth  at bedtime and may repeat dose one time if needed.   FLUTTER Devi Use a directed   furosemide 40 MG tablet Commonly known as:  LASIX Take 1 tablet (40 mg total) by mouth daily.   guaiFENesin-dextromethorphan 100-10 MG/5ML syrup Commonly known as:  ROBITUSSIN DM Take 5 mLs by mouth every 4 (four) hours as needed for cough.   HYDROcodone-acetaminophen 10-325 MG tablet Commonly known as:  NORCO Take 1 tablet by mouth 2 (two) times daily as needed. What changed:    when to take this  reasons to take this   loratadine 10 MG tablet Commonly known as:  CLARITIN Take 1 tablet (10 mg total) by mouth daily.   metFORMIN 500 MG tablet Commonly known as:  GLUCOPHAGE Take 1 tablet (500 mg total) by mouth 2 (two) times daily.   metoprolol tartrate 25 MG tablet Commonly known as:  LOPRESSOR Take 1 tablet (25 mg total) by mouth 2 (two) times daily.   nitroGLYCERIN 0.4 MG SL tablet Commonly known as:  NITROSTAT Place 1 tablet (0.4 mg total) under the tongue every 5 (five) minutes as needed. What changed:  reasons to take this   ondansetron 4 MG tablet Commonly known as:  ZOFRAN Take 1 tablet (4 mg total) by mouth every 8 (eight) hours as needed for nausea or vomiting.   OXYGEN Inhale 3-4 L into the lungs.   pantoprazole 40 MG tablet Commonly known as:  PROTONIX Take 1 tablet (40 mg total) by mouth 2 (two) times daily before a meal.   potassium chloride 10 MEQ tablet Commonly known as:  K-DUR,KLOR-CON Take 1 tablet (10 mEq total) by mouth daily.   predniSONE 10 MG tablet Commonly known as:  DELTASONE Take 1 tablet (10 mg total) by mouth daily with breakfast.   senna 8.6 MG tablet Commonly known as:  SENOKOT Take 1 tablet by mouth 2 (two) times daily as needed for constipation.   simethicone 80 MG chewable tablet Commonly known as:  MYLICON Chew 1 tablet (80 mg total) by  mouth 4 (four) times daily as needed for flatulence.   sodium chloride 0.65 % Soln nasal  spray Commonly known as:  OCEAN Place 1 spray into both nostrils as needed for congestion.   traMADol 50 MG tablet Commonly known as:  ULTRAM TAKE 2 TABLETS (100MG TOTAL) BY MOUTH EVERY 6 HOURS AS NEEDED FOR SEVERE PAIN What changed:  See the new instructions.   TRELEGY ELLIPTA 100-62.5-25 MCG/INH Aepb Generic drug:  Fluticasone-Umeclidin-Vilant INHALE 1 PUFF BY MOUTH ONCE DAILY What changed:  See the new instructions.   valsartan 40 MG tablet Commonly known as:  DIOVAN Take 1 tablet (40 mg total) by mouth daily.       Follow-up Information    [facility] Follow up.   Why:  For home hospice services, they will contact you oday and start services either today or tomorrow at home.  Contact information: [address] [place], [place], [place]. Schedule an appointment as soon as possible for a visit in 1 week(s).   Specialty:  Family Medicine Contact information: [address] [place] [address] [phone]        Sherren Mocha, MD .   Specialty:  Cardiology Contact information: [phone] [address] [place] [address] [phone]           Major procedures and Radiology Reports - PLEASE review detailed and final reports thoroughly  -     US guided right-sided thoracentesis on 02/09/2018.  CT - 1. Large masslike area of architectural distortion within the right upper lobe appears is increased in size and increasingly solid worrisome for tumor progression. 2. The right hilar mass is similar in size to previous exam but there is near new complete atelectasis and consolidation of the right middle lobe. 3. Progressive right lower lobe pulmonary nodule concerning for metastatic disease. 4. Increase in volume of right pleural effusion. Consider further evaluation with diagnostic thoracentesis if there is a concern for malignant effusion. 5. Aortic  Atherosclerosis (ICD10-I70.0) and Emphysema (ICD10-J43.9). 6. Multi vessel coronary artery atherosclerotic calcifications.   Dg Chest 2 View  Result Date: 02/10/2018 CLINICAL DATA:  Shortness of breath. EXAM: CHEST - 2 VIEW COMPARISON:  02/09/2018 FINDINGS: The cardiomediastinal silhouette is unchanged in the setting of a known right hilar mass. Postoperative changes are again noted in the left hilar region. Masslike consolidation in the right upper lobe is stable to slightly increased. There is a persistent small right pleural effusion. The left lung remains clear. No pneumothorax is identified. T4 and T5 compression fractures are again noted with prior augmentation at T4. There is mild thoracic levoscoliosis. IMPRESSION: Stable to slightly increased masslike consolidation in the right upper lobe. Small right pleural effusion. Electronically Signed   By: Logan Bores M.D.   On: 02/10/2018 09:48   Dg Chest 2 View  Result Date: 02/08/2018 CLINICAL DATA:  Increasing dyspnea over the past few days. EXAM: CHEST - 2 VIEW COMPARISON:  CT 02/07/2018 FINDINGS: Normal heart size with aortic atherosclerosis. Redemonstration of pulmonary consolidation in the right upper lobe abutting the major fissure with right hilar prominence suspicious for postobstructive change. Emphysematous changes are noted of both lungs, upper lobe predominant. Probable trace right effusion or thickening. 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 Chronic compression deformities in the upper thoracic spine with one level kyphoplasty noted. IMPRESSION: Redemonstration of pulmonary confluent opacities in the right  upper lobe with right hilar prominence suspicious for postobstructive change. Underlying malignancy is not excluded. Blunting of the right costophrenic angle may reflect a small effusion or thickening. AVN of left humeral head. Chronic moderate compression deformities of the upper thoracic  spine with one level kyphoplasty noted. Electronically Signed   By: Ashley Royalty M.D.   On: 02/08/2018 14:52   Dg Chest 2 View  Result Date: 01/27/2018 CLINICAL DATA:  Shortness of breath today.  History of lung cancer. EXAM: CHEST - 2 VIEW COMPARISON:  Chest x-rays dated 01/10/2017 and 12/13/2017. Chest CT dated 11/10/2017. FINDINGS: Slightly increased extent/density of the RIGHT upper lobe mass/consolidation, with associated RIGHT hilar lymphadenopathy. Persistent small RIGHT pleural effusion. New small rounded opacity within the LEFT upper lung, presumably rounded atelectasis or early developing pneumonia. No pneumothorax seen. Heart size and mediastinal contours appear stable. No acute appearing osseous abnormality. IMPRESSION: 1. Slightly increased extent/density of the RIGHT upper lobe mass/consolidation, with associated RIGHT hilar lymphadenopathy, better demonstrated on earlier chest CT of 11/10/2017. 2. Small RIGHT pleural effusion, stable. 3. New small subtle rounded opacity within the LEFT upper lung. This likely represents a small focus of rounded atelectasis or early developing pneumonia, with progressive metastatic disease considered less likely given the short-term interval since previous chest x-ray. Electronically Signed   By: Franki Cabot M.D.   On: 01/27/2018 16:31   Ct Chest Wo Contrast  Result Date: 02/07/2018 CLINICAL DATA:  Followup non-small cell lung cancer. EXAM: CT CHEST WITHOUT CONTRAST TECHNIQUE: Multidetector CT imaging of the chest was performed following the standard protocol without IV contrast. COMPARISON:  11/03/2017 FINDINGS: Cardiovascular: The heart size appears within normal limits. No pericardial effusion. Aortic atherosclerosis. Calcification within the LAD, left circumflex coronary arteries noted. Mediastinum/Nodes: Normal appearance of the thyroid gland. The trachea appears patent and is midline. Normal appearance of the esophagus. No enlarged axillary or  supraclavicular lymph nodes. No mediastinal adenopathy identified. Within the limitations of unenhanced technique the previous right hilar mass measures 3.1 cm, image 59/2. Previously 3.0 cm. Lungs/Pleura: The lungs are emphysematous. There is a moderate partially loculated right pleural effusion which has increased in volume from the previous examination. Within the right upper lobe there is a large area of progressive masslike architectural distortion measuring 5.6 x 8.4 by 5.0 cm, image 66/2. Previously this appeared less solid measuring 7.5 by 5.2 by 3.9 cm. Surrounding ground-glass attenuation is noted which appears progressive from previous exam. There is new complete postobstructive atelectasis of the right middle lobe, image 81/5. Multiple new or enlarging nodules are identified within the right lower lobe. The largest nodule measures 1.8 cm, image 120/5. New from previous exam. Also new from previous exam is a 1.6 cm right lower lobe lung nodule, image 134/5. There are mild patchy areas of ground-glass attenuation within the left lung. Upper Abdomen: No acute abnormality. Gallstone again noted. Small bilateral low-attenuation adrenal nodules are favored to represent adenomas. Unchanged. Musculoskeletal: Multiple chronic right posterior rib deformities are identified. Rib right humeral head unchanged compression deformities within the upper thoracic spine. Avascular necrosis. IMPRESSION: 1. Large masslike area of architectural distortion within the right upper lobe appears is increased in size and increasingly solid worrisome for tumor progression. 2. The right hilar mass is similar in size to previous exam but there is near new complete atelectasis and consolidation of the right middle lobe. 3. Progressive right lower lobe pulmonary nodule concerning for metastatic disease. 4. Increase in volume of right pleural effusion. Consider further evaluation  with diagnostic thoracentesis if there is a concern for  malignant effusion. 5. Aortic Atherosclerosis (ICD10-I70.0) and Emphysema (ICD10-J43.9). 6. Multi vessel coronary artery atherosclerotic calcifications. Electronically Signed   By: Kerby Moors M.D.   On: 02/07/2018 16:42   Dg Chest Port 1 View  Result Date: 02/11/2018 CLINICAL DATA:  Increased SOB and congestion today EXAM: PORTABLE CHEST 1 VIEW COMPARISON:  02/10/2018, CT 02/07/2018 FINDINGS: Normal cardiac silhouette. RIGHT basilar atelectasis and effusion unchanged. LEFT lung clear. Masslike consolidation in the RIGHT upper lobe again noted. RIGHT hilar density also unchanged IMPRESSION: 1. No significant interval change. 2. Masslike consolidation in the RIGHT upper lobe and RIGHT hilar density unchanged. 3. RIGHT pleural effusion and basilar atelectasis. Electronically Signed   By: Suzy Bouchard M.D.   On: 02/11/2018 08:11   Dg Chest Port 1 View  Result Date: 02/09/2018 CLINICAL DATA:  72 year old female with non-small cell lung cancer status post thoracentesis today with cough. EXAM: PORTABLE CHEST 1 VIEW COMPARISON:  0420 hours today. Chest CT 02/07/2018 and earlier. FINDINGS: Portable AP semi upright view at 1610 hours. Decreased layering in veiling opacity in the right lung with no right pneumothorax identified. Continued confluent right upper lobe airspace disease. Stable cardiac size and mediastinal contours. Postoperative changes to the left hilum. The left lung remains clear allowing for portable technique. Ununited fractures of the posterior right 4th through 6th ribs redemonstrated. IMPRESSION: 1. Decreased right pleural effusion with no pneumothorax identified following thoracentesis. 2. Otherwise stable chest. Electronically Signed   By: Genevie Ann M.D.   On: 02/09/2018 16:21   Dg Chest Port 1 View  Result Date: 02/09/2018 CLINICAL DATA:  Shortness of breath EXAM: PORTABLE CHEST 1 VIEW COMPARISON:  02/08/2018 FINDINGS: Increasing pulmonary opacity within the right upper lobe.  Persistent right hilar density is noted consistent with the known underlying mass. Right-sided pleural effusion is seen. Left lung is clear. A skin fold lies laterally over the left lung. No bony abnormality is seen. Postsurgical changes are again noted. IMPRESSION: Increasing density in the right upper lobe likely representing some postobstructive change the right hilar mass is again noted and stable. Large right pleural effusion is again seen and stable. Electronically Signed   By: Inez Catalina M.D.   On: 02/09/2018 07:10   Vas [place] Lower Extremity Venous (dvt)  Result Date: 01/29/2018  Lower Venous Study Indications: SOB.  Comparison Study: 12/14/17, 11/09/17 negative studies Performing Technologist: Landry Mellow RDMS, RVT  Examination Guidelines: A complete evaluation includes B-mode imaging, spectral Doppler, color Doppler, and power Doppler as needed of all accessible portions of each vessel. Bilateral testing is considered an integral part of a complete examination. Limited examinations for reoccurring indications may be performed as noted.  Right Venous Findings: +---------+---------------+---------+-----------+----------+-------------------+          CompressibilityPhasicitySpontaneityPropertiesSummary             +---------+---------------+---------+-----------+----------+-------------------+ CFV      Full           Yes      Yes                                      +---------+---------------+---------+-----------+----------+-------------------+ SFJ      Full                                                             +---------+---------------+---------+-----------+----------+-------------------+  FV Prox  Full                                                             +---------+---------------+---------+-----------+----------+-------------------+ FV Mid   Full                                                              +---------+---------------+---------+-----------+----------+-------------------+ FV DistalFull                                                             +---------+---------------+---------+-----------+----------+-------------------+ PFV      Full                                                             +---------+---------------+---------+-----------+----------+-------------------+ POP      Full           Yes      Yes                                      +---------+---------------+---------+-----------+----------+-------------------+ PTV      Full                                                             +---------+---------------+---------+-----------+----------+-------------------+ PERO     Full                                         not well visualized +---------+---------------+---------+-----------+----------+-------------------+  Left Venous Findings: +---------+---------------+---------+-----------+----------+-------------------+          CompressibilityPhasicitySpontaneityPropertiesSummary             +---------+---------------+---------+-----------+----------+-------------------+ CFV      Full           Yes      Yes                                      +---------+---------------+---------+-----------+----------+-------------------+ SFJ      Full                                                             +---------+---------------+---------+-----------+----------+-------------------+  FV Prox  Full                                                             +---------+---------------+---------+-----------+----------+-------------------+ FV Mid   Full                                                             +---------+---------------+---------+-----------+----------+-------------------+ FV DistalFull                                                              +---------+---------------+---------+-----------+----------+-------------------+ PFV      Full                                                             +---------+---------------+---------+-----------+----------+-------------------+ POP      Full           Yes      Yes                                      +---------+---------------+---------+-----------+----------+-------------------+ PTV      Full                                                             +---------+---------------+---------+-----------+----------+-------------------+ PERO     Full                                         not well visualized +---------+---------------+---------+-----------+----------+-------------------+    Summary: Right: There is no evidence of deep vein thrombosis in the lower extremity. However, portions of this examination were limited- see technologist comments above. No cystic structure found in the popliteal fossa. Left: There is no evidence of deep vein thrombosis in the lower extremity. However, portions of this examination were limited- see technologist comments above. No cystic structure found in the popliteal fossa.  *See table(s) above for measurements and observations. Electronically signed by Harold Barban MD on 01/29/2018 at 4:20:39 AM.    Final     Micro Results     Recent Results (from the past 240 hour(s))  Culture, blood (Routine x 2)     Status: None (Preliminary result)   Collection Time: 02/08/18  1:35 PM  Result Value Ref Range Status   Specimen Description BLOOD LEFT HAND  Final   Special Requests   Final    BOTTLES DRAWN  AEROBIC AND ANAEROBIC Blood Culture adequate volume   Culture   Final    NO GROWTH 3 DAYS Performed at [facility], [place] [address] [place], [place] [address]    Report Status PENDING  Incomplete  Culture, blood (Routine x 2)     Status: None (Preliminary result)   Collection Time: 02/08/18  3:59 PM  Result Value Ref Range Status    Specimen Description BLOOD RIGHT ANTECUBITAL  Final   Special Requests   Final    BOTTLES DRAWN AEROBIC AND ANAEROBIC Blood Culture results may not be optimal due to an inadequate volume of blood received in culture bottles   Culture   Final    NO GROWTH 3 DAYS Performed at [facility], [place] [address], [place], [place] [address]    Report Status PENDING  Incomplete  Body fluid culture     Status: None (Preliminary result)   Collection Time: 02/09/18  2:58 PM  Result Value Ref Range Status   Specimen Description THORACENTESIS  Final   Special Requests NONE  Final   Gram Stain   Final    RARE WBC PRESENT, PREDOMINANTLY MONONUCLEAR NO ORGANISMS SEEN    Culture   Final    NO GROWTH 2 DAYS Performed at [facility], [place] [address], [place], [place] [address]    Report Status PENDING  Incomplete    Today   Subjective    [name] today has no headache,no chest abdominal pain,no new weakness tingling or numbness, feels much better wants to go home today.     Objective   Blood pressure (!) 152/48, pulse 95 temperature 98.1 F (36.7 C), temperature source Oral, resp. rate 18, height 5' 6"  (1.676 m), weight 73.7 kg, SpO2 100 %.   Intake/Output Summary (Last 24 hours) at 02/11/2018 1131 Last data filed at 02/11/2018 0800 Gross per 24 hour  Intake 80 ml  Output -  Net 80 ml    Exam Awake Alert, Oriented x 3, No new F.N deficits, Normal affect [place].AT,PERRAL Supple Neck,No JVD, No cervical lymphadenopathy appriciated.  Symmetrical Chest wall movement, reduced right-sided breath sounds, few rales RRR,No Gallops,Rubs or new Murmurs, No Parasternal Heave +ve B.Sounds, Abd Soft, Non tender, No organomegaly appriciated, No rebound -guarding or rigidity. No Cyanosis, Clubbing or edema, No new Rash or bruise   Data Review   CBC w Diff:  Lab Results  Component Value Date   WBC 9.7 02/09/2018   HGB 9.8 (L) 02/09/2018   HGB 10.7 (L) 02/07/2018   HGB 12.7  04/07/2017   HCT 32.8 (L) 02/09/2018   HCT 39.0 04/07/2017   PLT 266 02/09/2018   PLT 280 02/07/2018   PLT 203 04/07/2017   LYMPHOPCT 6 02/08/2018   LYMPHOPCT 23.7 04/07/2017   MONOPCT 3 02/08/2018   MONOPCT 8.0 04/07/2017   EOSPCT 0 02/08/2018   EOSPCT 12.9 (H) 04/07/2017   BASOPCT 0 02/08/2018   BASOPCT 0.5 04/07/2017    CMP:  Lab Results  Component Value Date   NA 137 02/10/2018   NA 144 01/23/2018   NA 141 04/07/2017   K 3.9 02/10/2018   K 3.4 (L) 04/07/2017   CL 101 02/10/2018   CL 99 07/21/2012   CO2 27 02/10/2018   CO2 27 04/07/2017   BUN 25 (H) 02/10/2018   BUN 12 01/23/2018   BUN 13.8 04/07/2017   CREATININE 1.22 (H) 02/10/2018   CREATININE 1.14 (H) 02/07/2018   CREATININE 1.1 04/07/2017  PROT 5.5 (L) 02/09/2018   PROT 5.5 (L) 02/09/2018   PROT 6.7 04/07/2017   ALBUMIN 3.2 (L) 02/09/2018   ALBUMIN 3.9 04/07/2017   BILITOT 0.8 02/09/2018   BILITOT 0.5 02/07/2018   BILITOT 0.36 04/07/2017   ALKPHOS 63 02/09/2018   ALKPHOS 94 04/07/2017   AST 14 (L) 02/09/2018   AST 13 (L) 02/07/2018   AST 13 04/07/2017   ALT 11 02/09/2018   ALT 10 02/07/2018   ALT 8 04/07/2017  .   Total Time in preparing paper work, data evaluation and todays exam - 89 minutes  Lala Lund M.D on 02/11/2018 at 11:31 AM  [facility]   Office  [phone]

## 2018-02-12 LAB — CHOLESTEROL, BODY FLUID: CHOL FL: 47 mg/dL

## 2018-02-12 LAB — ADENOSIDE DEAMINASE, PLEURAL FL

## 2018-02-13 LAB — BODY FLUID CULTURE: CULTURE: NO GROWTH

## 2018-02-13 LAB — CULTURE, BLOOD (ROUTINE X 2): Culture: NO GROWTH

## 2018-02-13 NOTE — Telephone Encounter (Signed)
Patient advise she had went to the hospital over the weekend and is no longer neededs anything. She will follow up Wednesday with [name] at 2:30pm

## 2018-02-14 DIAGNOSIS — F339 Major depressive disorder, recurrent, unspecified: Secondary | ICD-10-CM | POA: Diagnosis not present

## 2018-02-14 DIAGNOSIS — J9610 Chronic respiratory failure, unspecified whether with hypoxia or hypercapnia: Secondary | ICD-10-CM | POA: Diagnosis not present

## 2018-02-14 DIAGNOSIS — J918 Pleural effusion in other conditions classified elsewhere: Secondary | ICD-10-CM | POA: Diagnosis not present

## 2018-02-14 DIAGNOSIS — J449 Chronic obstructive pulmonary disease, unspecified: Secondary | ICD-10-CM | POA: Diagnosis not present

## 2018-02-14 DIAGNOSIS — I2510 Atherosclerotic heart disease of native coronary artery without angina pectoris: Secondary | ICD-10-CM | POA: Diagnosis not present

## 2018-02-14 DIAGNOSIS — I509 Heart failure, unspecified: Secondary | ICD-10-CM | POA: Diagnosis not present

## 2018-02-14 DIAGNOSIS — C3490 Malignant neoplasm of unspecified part of unspecified bronchus or lung: Secondary | ICD-10-CM | POA: Diagnosis not present

## 2018-02-14 DIAGNOSIS — E1159 Type 2 diabetes mellitus with other circulatory complications: Secondary | ICD-10-CM | POA: Diagnosis not present

## 2018-02-14 DIAGNOSIS — D6869 Other thrombophilia: Secondary | ICD-10-CM | POA: Diagnosis not present

## 2018-02-14 DIAGNOSIS — I4892 Unspecified atrial flutter: Secondary | ICD-10-CM | POA: Diagnosis not present

## 2018-02-14 DIAGNOSIS — I10 Essential (primary) hypertension: Secondary | ICD-10-CM | POA: Diagnosis not present

## 2018-02-15 ENCOUNTER — Ambulatory Visit: Payer: Medicare Other | Admitting: Primary Care

## 2018-02-15 ENCOUNTER — Ambulatory Visit (INDEPENDENT_AMBULATORY_CARE_PROVIDER_SITE_OTHER): Admitting: Primary Care

## 2018-02-15 ENCOUNTER — Encounter: Payer: Self-pay | Admitting: Primary Care

## 2018-02-15 ENCOUNTER — Ambulatory Visit (INDEPENDENT_AMBULATORY_CARE_PROVIDER_SITE_OTHER)
Admission: RE | Admit: 2018-02-15 | Discharge: 2018-02-15 | Disposition: A | Discharge status: Home / Self Care | Source: Ambulatory Visit | Attending: Primary Care | Admitting: Primary Care

## 2018-02-15 ENCOUNTER — Other Ambulatory Visit (INDEPENDENT_AMBULATORY_CARE_PROVIDER_SITE_OTHER)

## 2018-02-15 VITALS — BP 120/60 | HR 98 | Temp 98.7°F | Ht 66.0 in

## 2018-02-15 DIAGNOSIS — C3490 Malignant neoplasm of unspecified part of unspecified bronchus or lung: Secondary | ICD-10-CM | POA: Diagnosis not present

## 2018-02-15 DIAGNOSIS — J90 Pleural effusion, not elsewhere classified: Secondary | ICD-10-CM

## 2018-02-15 DIAGNOSIS — J918 Pleural effusion in other conditions classified elsewhere: Secondary | ICD-10-CM | POA: Diagnosis not present

## 2018-02-15 DIAGNOSIS — I5032 Chronic diastolic (congestive) heart failure: Secondary | ICD-10-CM | POA: Diagnosis not present

## 2018-02-15 DIAGNOSIS — J449 Chronic obstructive pulmonary disease, unspecified: Secondary | ICD-10-CM | POA: Diagnosis not present

## 2018-02-15 DIAGNOSIS — J9610 Chronic respiratory failure, unspecified whether with hypoxia or hypercapnia: Secondary | ICD-10-CM | POA: Diagnosis not present

## 2018-02-15 DIAGNOSIS — D6869 Other thrombophilia: Secondary | ICD-10-CM | POA: Diagnosis not present

## 2018-02-15 DIAGNOSIS — R0602 Shortness of breath: Secondary | ICD-10-CM | POA: Diagnosis not present

## 2018-02-15 DIAGNOSIS — C3411 Malignant neoplasm of upper lobe, right bronchus or lung: Secondary | ICD-10-CM

## 2018-02-15 DIAGNOSIS — R05 Cough: Secondary | ICD-10-CM | POA: Diagnosis not present

## 2018-02-15 DIAGNOSIS — I509 Heart failure, unspecified: Secondary | ICD-10-CM | POA: Diagnosis not present

## 2018-02-15 LAB — CULTURE, BLOOD (ROUTINE X 2)
Culture: NO GROWTH
Special Requests: ADEQUATE

## 2018-02-15 LAB — CBC
HEMATOCRIT: 32.6 % — AB (ref 36.0–46.0)
HEMOGLOBIN: 10.7 g/dL — AB (ref 12.0–15.0)
MCHC: 32.9 g/dL (ref 30.0–36.0)
MCV: 90.1 fl (ref 78.0–100.0)
Platelets: 296 10*3/uL (ref 150.0–400.0)
RBC: 3.62 Mil/uL — ABNORMAL LOW (ref 3.87–5.11)
RDW: 15 % (ref 11.5–15.5)
WBC: 10.1 10*3/uL (ref 4.0–10.5)

## 2018-02-15 LAB — BASIC METABOLIC PANEL
BUN: 21 mg/dL (ref 6–23)
CALCIUM: 9.1 mg/dL (ref 8.4–10.5)
CO2: 30 mEq/L (ref 19–32)
CREATININE: 1.14 mg/dL (ref 0.40–1.20)
Chloride: 98 mEq/L (ref 96–112)
GFR: 49.53 mL/min — AB (ref >60.00)
Glucose, Bld: 175 mg/dL — ABNORMAL HIGH (ref 70–99)
POTASSIUM: 4.2 meq/L (ref 3.5–5.1)
Sodium: 137 mEq/L (ref 135–145)

## 2018-02-15 LAB — BRAIN NATRIURETIC PEPTIDE: Pro B Natriuretic peptide (BNP): 36 pg/mL (ref 0.0–100.0)

## 2018-02-15 MED ORDER — BENZONATATE 100 MG PO CAPS: 100.0000 mg | ORAL_CAPSULE | Freq: Four times a day (QID) | ORAL | 1 refills | Status: AC | PRN

## 2018-02-15 MED ORDER — BENZONATATE 100 MG PO CAPS: 100.0000 mg | ORAL_CAPSULE | Freq: Four times a day (QID) | ORAL | 1 refills | Status: DC | PRN

## 2018-02-15 NOTE — Progress Notes (Addendum)
_0  ID: [name], female    DOB: 1945-05-25, 72 y.o.   MRN: [id number]  Chief Complaint  Patient presents with  . Follow-up    SOB all the time-Cough with white to beige mucus-chest tightness and wheezing    Referring provider: [name], DO  HPI: 72 year old female, former smoker quit 1999. 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 Hx left lobectomy. Patient of [name]. Followed by [name] with oncology, last seen on July 25th. Patient continues with Nivolumab 273m IV every 2 weeks.  Repeat CHEST CT W/contrast reviewed by Dr. MJulien Nordmannshowing no concerning findings for progression. There is a new RLL nodule 64m Recommended repeating every 3 months.  12/26/17 -ROV/Dr. ByLamonte Sakaihis follow-up visit for [age]ear old woman with severe COPD, stage III adenocarcinoma with residual right-sided disease, right pleural effusion, associated hypoxemic respiratory failure.  She also has hypertension with diastolic dysfunction.  She was hospitalized in late August 08/30-09/05 with progressive dyspnea, edema.  She was diuresed with improvement.  She was also treated empirically for possible exacerbation of COPD.  A right thoracentesis was done on 12/12/2017 and fluid appear to be most consistent with a transudate, culture negative, cytology negative. She reports that she is having some increased cough, nasal congestion, white sputum. She is on loratadine, omeprazole. She is on trelegy, uses albuterol nebs about 2x a day. Remains on lasix 4039maily, was started on lisinopril during the hospitalization. She finished her pred taper end of last week.   01/10/2018 Last seen on 09/16 for hospital follow-up for dyspnea and COPD exac. She was diuresed with improvement and is s/p right thoracentesis.  Started on [employer]isinopril change to valsartan 66m68mr Dr. ByruLamonte Sakainsideration for low dose daily prednisone.  Patient presents today for acute visit, complains of not feeling well. Called office on 09/23 with reports of fever 99.6 and productive cough with brown sputum. Started on Levaquin x 7 days. She is taking Levquin daily, has one day left. Feels more congested. Cough is mostly clear, started yellow today. Not coughing as much today, states its slowing down. Wheezing in the morning, Taking Allegra, Mucinex 1200mg83mce daily. Low grade temp 99.1, she's been taking tylenol. Doesn't have much of an appetite, making herself eat. Drinking fluids and staying hydrated. Had gatoraid last night. Feet have been swelling. Takes lasix 40mg 46my daily. Oxygen remains stable. Loose stool, not diarrhea. Denies urinary frequency, dysuria, N/V, abdominal pain. Rapid flu negative. Plan obtain stat CXR and labs. BP 100/50 (124/64). Plan hold Diovan, restart 1/2 tabs if >140/80.    01/13/2018 Patient presents today for 3 days follow-up visit for COPD exacerbation. CXR showed right upper lung mass/consolidation without significant change. Associated right hilar adenopathy may be present. Associated small right pleural effusion again noted. Mild left upper lobe subsegmental atelectasis. Rapid flu negative. Labs were normal. Levaquin extended for 3 additional days and started prednisone 20mg x28mays. Plan to consider placing patient on daily prednisone, discussed with Dr. Byrum. Lamonte Sakai00/50 three days ago, holding diovan. Consider restarting at lower dose.    Patient still complains of sob. Reports that she has been having panic attacks. Takes xanax 1mg to 33mep and half tab prn. Difficulty managing her medications, looking for an easy medication chart to follow. Anxiety is driven by her fear of not being in  control and knowing she is not getting better. Complains of arthritic pain and fibromyalgia, takes Tramadol prn  and if that does not help will take Norco pain medication. Continues to have some leg swelling, states that she has to keep feet elevated. Reports that she has been taking 34m of lasix. Has not been wearig compression stockings. Aid comes at 11am and can help her put stockings on. Lots of nasal congestion and PND. Lung sounds mostly clear. Occ insp wheeze. Breathing is not labored at rest. Tearful today.    02/17/2018 Patient presents today for 4 week follow-up office visit, accompanied by home health aid. Since last being seen in early October she was hospitalized twice, for presumed CAP treated with IV cefepime and Levaquin and moderate-large right sided recurrent pleural effusion. She has hx right sided non-small cell lung cancer with right lung mets. Hilar involvement with right sided lung collapse/atelectasis and right sided pleural effusion. Underwent ultrasound-guided thoracentesis on 10/31 showing exudative fluid. She was discharged home with hospice on 02/11/18. Goal of care now is comfort, cont palliative chemotherapy or radiation.   States that she feels sob all the time. O2 sat 97% on 3.5 L oxygen. Continues Trelegy daily and using nebulizer 2-3 times a day. Prednisone 168mdaily. Needs refill of tessalon perles. Complains of nasal drainage, white mucus color. Feels congestion is worse since being on Zyrtec, prefers Claritin. Uses ocean nasal spray. Has pain in rib/back on right side, taking tramadol as needed. Has Norco available if pain not controlled.   Allergies  Allergen Reactions  . Iohexol Shortness Of Breath and Other (See Comments)    "burns me up inside"    Pt does fine with 13 hour prep  01/17/12  . Other     CAN'T STAND THE SMELL OF PURE BLEACH; "HAVE TO BACK UP AND POUR AND DON'T BREATH IT TIL i GET CAP BACK ON; DILUTE IT TO SPRAY"  . Anoro Ellipta [Umeclidinium-Vilanterol] Swelling    Swelling of lips and mouth  . Codeine Itching and Nausea And Vomiting    REACTION: GI upset    . Protonix [Pantoprazole Sodium] Diarrhea    Immunization History  Administered Date(s) Administered  . Influenza Split 02/08/2007, 03/19/2011, 01/27/2012  . Influenza Whole 01/02/2010  . Influenza, High Dose Seasonal PF 02/03/2017  . Influenza,inj,Quad PF,6+ Mos 12/27/2012, 02/12/2014, 01/22/2016  . Pneumococcal Conjugate-13 02/14/2017  . Pneumococcal Polysaccharide-23 03/27/2013  . Tdap 02/14/2017    Past Medical History:  Diagnosis Date  . Adenocarcinoma, lung ([facility]   upper left lobe  . Anemia in neoplastic disease   . Anxiety   . Arthritis    "all over my body"  . Asthma   . Asymptomatic varicose veins   . Atherosclerosis of native arteries of the extremities, unspecified   . Atrial fibrillation ([facility]  . Bronchial pneumonia    history  . 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 Chemotherapy adverse reaction 09/15/11   "makes me light headed and pass out; this is 3rd blood transfusion since going thru it"  . CHF (congestive heart failure) ([facility]  . Chronic airway obstruction, not elsewhere classified   . Chronic fatigue 08/19/2015  . Chronic sinusitis   . Complication of anesthesia    slow to wake up  . COPD with emphysema ([facility]  . Coronary artery disease    Dr. CoBurt Knackad stress test done  . DDD (degenerative disc disease), lumbar   . Depressive disorder, not  elsewhere classified   . Diseases of lips   . Disorder of bone and cartilage, unspecified   . DVT (deep venous thrombosis) (HCC)   . Fibromyalgia   . Functional urinary incontinence   . GERD (gastroesophageal reflux disease)   . Hard of hearing, right   . Herpes zoster without mention of complication   . Hip pain, chronic 09/17/2014  . History of blood clots    "2 in my aorta"  . History of blood transfusion   . History of bronchitis   . History of echocardiogram    Echo 11/18: mild LVH, EF 60-65, no RWMA, Gr 1 DD, Ao Sclerosis, mild AI, RVH with low nl RSVF, trivial eff  . Hx of radiation therapy  04/28/11 -06/08/11   right upper lobe-lung  . Hyperlipidemia   . Hypertension   . Kidney infection    "related to chemo"  . Lumbago   . Malignant neoplasm of bronchus and lung, unspecified site   . Mental disorder   . Muscle weakness (generalized)   . Non-small cell lung cancer (HCC) 03/31/2011   Adenocarcinoma Rt upper lobe mass  . Osteoarthrosis, unspecified whether generalized or localized, unspecified site   . Osteoporosis   . Other abnormal blood chemistry   . Other and unspecified hyperlipidemia   . PAD (peripheral artery disease) (HCC)   . Pain in thoracic spine   . 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 Primary cancer of right upper lobe of lung (HCC) 02/08/2011   Biopsy Rt upper lobe mass 03/31/11>>adenocarcinoma DIAGNOSIS: Stage IIB/IIIA non-small cell lung cancer consistent with adenocarcinoma diagnosed in December 2012.   PRIOR THERAPY:  1) Status post radiation therapy to the right upper lobe lung mass completed on 06/04/2011 under the care of Dr. Wentworth.  2) Systemic chemotherapy with carboplatin for AUC of 5 and Alimta 500 mg/M2 giving every 3   . Reflux   . Reflux esophagitis   . Respiratory failure with hypoxia 01/02/2010  . Restless legs syndrome (RLS)   . Scoliosis   . Screening for thyroid disorder   . Shortness of breath    "anytime really"  . Sinus headache    "all the time"  . Spasm of muscle   . Syncope and collapse   . Type II or unspecified type diabetes mellitus with neurological manifestations, uncontrolled(250.62)   . Type II or unspecified type diabetes mellitus without mention of complication, not stated as uncontrolled   . Type II or unspecified type diabetes mellitus without mention of complication, not stated as uncontrolled   . Unspecified constipation   . Unspecified essential hypertension   . Unspecified hearing loss   . Unspecified hereditary and idiopathic peripheral neuropathy   . Unspecified vitamin D deficiency     Tobacco  History: Social History   Tobacco Use  Smoking Status Former Smoker  . Packs/day: 1.00  . Years: 40.00  . Pack years: 40.00  . Types: Cigarettes  . Last attempt to quit: 02/10/1998  . Years since quitting: 20.0  Smokeless Tobacco Never Used   Counseling given: Not Answered   Outpatient Medications Prior to Visit  Medication Sig Dispense Refill  . ACCU-CHEK AVIVA PLUS test strip USE AS DIRECTED TO CHECK BLOOD SUGAR THREE TIMES DAILY 100 each 2  . albuterol (PROVENTIL HFA;VENTOLIN HFA) 108 (90 Base) MCG/ACT inhaler Inhale 2 puffs into the lungs every 6 (six) hours as needed for wheezing or shortness of breath. 1 Inhaler 6  . albuterol (PROVENTIL) (2.5 MG/3ML) 0.083%   nebulizer solution USE 1 VIAL VIA NEBULIZER EVERY 6 HOURS AS NEEDED FOR WHEEZING OR SHORTNESS OF BREATH (Patient taking differently: Take 2.5 mg by nebulization every 6 (six) hours as needed for wheezing or shortness of breath. ) 75 mL 3  . aspirin 81 MG tablet Take 81 mg by mouth daily.    . Blood Glucose Monitoring Suppl (ACCU-CHEK AVIVA PLUS) w/Device KIT Use as directed to check blood sugar Dx: E11.9, E11.49 1 kit 0  . Calcium Carbonate-Vit D-Min (CALTRATE PLUS PO) Take 1 tablet by mouth every other day.     . furosemide (LASIX) 40 MG tablet Take 1 tablet (40 mg total) by mouth daily. 90 tablet 3  . LORazepam (ATIVAN) 0.5 MG tablet Take 0.5 mg by mouth every 4 (four) hours as needed for anxiety.    . metFORMIN (GLUCOPHAGE) 500 MG tablet Take 1 tablet (500 mg total) by mouth 2 (two) times daily.    . metoprolol tartrate (LOPRESSOR) 25 MG tablet Take 1 tablet (25 mg total) by mouth 2 (two) times daily. 180 tablet 3  . OXYGEN Inhale 3-4 L into the lungs.     . pantoprazole (PROTONIX) 40 MG tablet Take 1 tablet (40 mg total) by mouth 2 (two) times daily before a meal. 60 tablet 0  . potassium chloride (K-DUR,KLOR-CON) 10 MEQ tablet Take 1 tablet (10 mEq total) by mouth daily. 90 tablet 3  . predniSONE (DELTASONE) 10 MG tablet  Take 1 tablet (10 mg total) by mouth daily with breakfast. 30 tablet 6  . Respiratory Therapy Supplies (FLUTTER) DEVI Use a directed 1 each 0  . sodium chloride (OCEAN) 0.65 % SOLN nasal spray Place 1 spray into both nostrils as needed for congestion.  0  . traMADol (ULTRAM) 50 MG tablet TAKE 2 TABLETS (100MG TOTAL) BY MOUTH EVERY 6 HOURS AS NEEDED FOR SEVERE PAIN (Patient taking differently: Take 100 mg by mouth every 6 (six) hours as needed for moderate pain. ) 90 tablet 2  . TRELEGY ELLIPTA 100-62.5-25 MCG/INH AEPB INHALE 1 PUFF BY MOUTH ONCE DAILY (Patient taking differently: Inhale 1 puff into the lungs daily. ) 60 each 5  . valsartan (DIOVAN) 40 MG tablet Take 1 tablet (40 mg total) by mouth daily. 90 tablet 3  . benzonatate (TESSALON PERLES) 100 MG capsule Take 1 capsule (100 mg total) by mouth every 6 (six) hours as needed for cough. 30 capsule 0  . Cetirizine HCl (ZYRTEC ALLERGY) 10 MG CAPS Take 1 capsule (10 mg total) by mouth at bedtime and may repeat dose one time if needed. 30 capsule 0  . ALPRAZolam (XANAX) 0.5 MG tablet TAKE 1 TABLET BY MOUTH 3 TIMES A DAY AS NEEDED FOR ANXIETY (Patient not taking: Reported on 02/15/2018) 10 tablet 0  . guaiFENesin-dextromethorphan (ROBITUSSIN DM) 100-10 MG/5ML syrup Take 5 mLs by mouth every 4 (four) hours as needed for cough. (Patient not taking: Reported on 02/15/2018) 118 mL 0  . HYDROcodone-acetaminophen (NORCO) 10-325 MG tablet Take 1 tablet by mouth 2 (two) times daily as needed. (Patient not taking: Reported on 02/15/2018) 60 tablet 0  . loratadine (CLARITIN) 10 MG tablet Take 1 tablet (10 mg total) by mouth daily. (Patient not taking: Reported on 02/15/2018) 30 tablet 0  . nitroGLYCERIN (NITROSTAT) 0.4 MG SL tablet Place 1 tablet (0.4 mg total) under the tongue every 5 (five) minutes as needed. (Patient not taking: Reported on 02/15/2018) 25 tablet 3  . ondansetron (ZOFRAN) 4 MG tablet Take 1 tablet (4 mg  total) by mouth every 8 (eight) hours as  needed for nausea or vomiting. (Patient not taking: Reported on 02/15/2018) 20 tablet 1  . senna (SENOKOT) 8.6 MG tablet Take 1 tablet by mouth 2 (two) times daily as needed for constipation.     . simethicone (MYLICON) 80 MG chewable tablet Chew 1 tablet (80 mg total) by mouth 4 (four) times daily as needed for flatulence. (Patient not taking: Reported on 02/15/2018) 30 tablet 0   Facility-Administered Medications Prior to Visit  Medication Dose Route Frequency Provider Last Rate Last Dose  . hyaluronate sodium (RADIAPLEXRX) gel   Topical Once Wentworth, Stacy, MD      . topical emolient (BIAFINE) emulsion   Topical PRN Wentworth, Stacy, MD        Review of Systems  Review of Systems  Constitutional: Negative.   HENT: Positive for congestion and postnasal drip.   Respiratory: Positive for cough and shortness of breath. Negative for wheezing.   Cardiovascular: Negative.   Musculoskeletal: Positive for back pain.    Physical Exam  BP 120/60 (BP Location: Left Arm, Cuff Size: Normal)   Pulse 98   Temp 98.7 F (37.1 C)   Ht 5' 6" (1.676 m)   SpO2 97%   BMI 26.22 kg/m  Physical Exam  Constitutional: She is oriented to person, place, and time. She appears well-developed and well-nourished. No distress.  HENT:  Head: Normocephalic and atraumatic.  Eyes: Pupils are equal, round, and reactive to light. EOM are normal.  Neck: Normal range of motion. Neck supple.  Cardiovascular: Normal rate and regular rhythm.  Pulmonary/Chest: Effort normal. No respiratory distress. She has no wheezes.  Min diminished, scattered rales   Neurological: She is alert and oriented to person, place, and time.  Skin: Skin is warm and dry.  Psychiatric: Her behavior is normal.  Anxious at baseline      Lab Results:  CBC    Component Value Date/Time   WBC 10.1 02/15/2018 1551   RBC 3.62 (L) 02/15/2018 1551   HGB 10.7 (L) 02/15/2018 1551   HGB 10.7 (L) 02/07/2018 1108   HGB 12.7 04/07/2017 1015    HCT 32.6 (L) 02/15/2018 1551   HCT 39.0 04/07/2017 1015   PLT 296.0 02/15/2018 1551   PLT 280 02/07/2018 1108   PLT 203 04/07/2017 1015   MCV 90.1 02/15/2018 1551   MCV 90.1 04/07/2017 1015   MCH 27.9 02/09/2018 0419   MCHC 32.9 02/15/2018 1551   RDW 15.0 02/15/2018 1551   RDW 13.5 04/07/2017 1015   LYMPHSABS 0.7 02/08/2018 1342   LYMPHSABS 2.1 04/07/2017 1015   MONOABS 0.4 02/08/2018 1342   MONOABS 0.7 04/07/2017 1015   EOSABS 0.0 02/08/2018 1342   EOSABS 1.1 (H) 04/07/2017 1015   EOSABS 0.4 08/06/2015 1036   BASOSABS 0.0 02/08/2018 1342   BASOSABS 0.0 04/07/2017 1015    BMET    Component Value Date/Time   NA 137 02/15/2018 1551   NA 144 01/23/2018 1406   NA 141 04/07/2017 1016   K 4.2 02/15/2018 1551   K 3.4 (L) 04/07/2017 1016   CL 98 02/15/2018 1551   CL 99 07/21/2012 1125   CO2 30 02/15/2018 1551   CO2 27 04/07/2017 1016   GLUCOSE 175 (H) 02/15/2018 1551   GLUCOSE 93 04/07/2017 1016   GLUCOSE 184 (H) 07/21/2012 1125   BUN 21 02/15/2018 1551   BUN 12 01/23/2018 1406   BUN 13.8 04/07/2017 1016   CREATININE 1.14 02/15/2018 1551     CREATININE 1.14 (H) 02/07/2018 1108   CREATININE 1.1 04/07/2017 1016   CALCIUM 9.1 02/15/2018 1551   CALCIUM 9.5 04/07/2017 1016   GFRNONAA 43 (L) 02/10/2018 0306   GFRNONAA 47 (L) 02/07/2018 1108   GFRAA 50 (L) 02/10/2018 0306   GFRAA 54 (L) 02/07/2018 1108    BNP    Component Value Date/Time   BNP 39.5 01/27/2018 1526    ProBNP    Component Value Date/Time   PROBNP 36.0 02/15/2018 1551    Imaging: Dg Chest 2 View  Result Date: 02/16/2018 CLINICAL DATA:  71 year old female with non-small cell lung cancer. Cough and shortness of breath. Thoracentesis on 02/09/2018. EXAM: CHEST - 2 VIEW COMPARISON:  02/11/2018 and earlier. FINDINGS: Seated AP and lateral views of the chest. Stable lung volumes and confluent right upper lobe opacity since 01/27/2018. Trace if any residual pleural fluid. Postoperative changes to both lungs and  the mediastinum. Stable cardiac size and mediastinal contours. Visualized tracheal air column is within normal limits. No pneumothorax or new pulmonary opacity. Stable visualized osseous structures. Calcified aortic atherosclerosis. Paucity of bowel gas in the upper abdomen. IMPRESSION: 1. Trace if any residual pleural effusion. Otherwise chest since 01/27/2018. 2. No new cardiopulmonary abnormality. Electronically Signed   By: Genevie Ann M.D.   On: 02/16/2018 09:13   Dg Chest 2 View  Result Date: 02/10/2018 CLINICAL DATA:  Shortness of breath. EXAM: CHEST - 2 VIEW COMPARISON:  02/09/2018 FINDINGS: The cardiomediastinal silhouette is unchanged in the setting of a known right hilar mass. Postoperative changes are again noted in the left hilar region. Masslike consolidation in the right upper lobe is stable to slightly increased. There is a persistent small right pleural effusion. The left lung remains clear. No pneumothorax is identified. T4 and T5 compression fractures are again noted with prior augmentation at T4. There is mild thoracic levoscoliosis. IMPRESSION: Stable to slightly increased masslike consolidation in the right upper lobe. Small right pleural effusion. Electronically Signed   By: Logan Bores M.D.   On: 02/10/2018 09:48   Dg Chest 2 View  Result Date: 02/08/2018 CLINICAL DATA:  Increasing dyspnea over the past few days. EXAM: CHEST - 2 VIEW COMPARISON:  CT 02/07/2018 FINDINGS: Normal heart size with aortic atherosclerosis. Redemonstration of pulmonary consolidation in the right upper lobe abutting the major fissure with right hilar prominence suspicious for postobstructive change. Emphysematous changes are noted of both lungs, upper lobe predominant. Probable trace right effusion or thickening. 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 Chronic compression deformities in the upper thoracic spine with one level kyphoplasty noted. IMPRESSION:  Redemonstration of pulmonary confluent opacities in the right upper lobe with right hilar prominence suspicious for postobstructive change. Underlying malignancy is not excluded. Blunting of the right costophrenic angle may reflect a small effusion or thickening. AVN of left humeral head. Chronic moderate compression deformities of the upper thoracic spine with one level kyphoplasty noted. Electronically Signed   By: Ashley Royalty M.D.   On: 02/08/2018 14:52   Dg Chest 2 View  Result Date: 01/27/2018 CLINICAL DATA:  Shortness of breath today.  History of lung cancer. EXAM: CHEST - 2 VIEW COMPARISON:  Chest x-rays dated 01/10/2017 and 12/13/2017. Chest CT dated 11/10/2017. FINDINGS: Slightly increased extent/density of the RIGHT upper lobe mass/consolidation, with associated RIGHT hilar lymphadenopathy. Persistent small RIGHT pleural effusion. New small rounded opacity within the LEFT upper lung, presumably rounded atelectasis or early developing pneumonia. No pneumothorax seen. Heart size and  mediastinal contours appear stable. No acute appearing osseous abnormality. IMPRESSION: 1. Slightly increased extent/density of the RIGHT upper lobe mass/consolidation, with associated RIGHT hilar lymphadenopathy, better demonstrated on earlier chest CT of 11/10/2017. 2. Small RIGHT pleural effusion, stable. 3. New small subtle rounded opacity within the LEFT upper lung. This likely represents a small focus of rounded atelectasis or early developing pneumonia, with progressive metastatic disease considered less likely given the short-term interval since previous chest x-ray. Electronically Signed   By: Franki Cabot M.D.   On: 01/27/2018 16:31   Ct Chest Wo Contrast  Result Date: 02/07/2018 CLINICAL DATA:  Followup non-small cell lung cancer. EXAM: CT CHEST WITHOUT CONTRAST TECHNIQUE: Multidetector CT imaging of the chest was performed following the standard protocol without IV contrast. COMPARISON:  11/03/2017  FINDINGS: Cardiovascular: The heart size appears within normal limits. No pericardial effusion. Aortic atherosclerosis. Calcification within the LAD, left circumflex coronary arteries noted. Mediastinum/Nodes: Normal appearance of the thyroid gland. The trachea appears patent and is midline. Normal appearance of the esophagus. No enlarged axillary or supraclavicular lymph nodes. No mediastinal adenopathy identified. Within the limitations of unenhanced technique the previous right hilar mass measures 3.1 cm, image 59/2. Previously 3.0 cm. Lungs/Pleura: The lungs are emphysematous. There is a moderate partially loculated right pleural effusion which has increased in volume from the previous examination. Within the right upper lobe there is a large area of progressive masslike architectural distortion measuring 5.6 x 8.4 by 5.0 cm, image 66/2. Previously this appeared less solid measuring 7.5 by 5.2 by 3.9 cm. Surrounding ground-glass attenuation is noted which appears progressive from previous exam. There is new complete postobstructive atelectasis of the right middle lobe, image 81/5. Multiple new or enlarging nodules are identified within the right lower lobe. The largest nodule measures 1.8 cm, image 120/5. New from previous exam. Also new from previous exam is a 1.6 cm right lower lobe lung nodule, image 134/5. There are mild patchy areas of ground-glass attenuation within the left lung. Upper Abdomen: No acute abnormality. Gallstone again noted. Small bilateral low-attenuation adrenal nodules are favored to represent adenomas. Unchanged. Musculoskeletal: Multiple chronic right posterior rib deformities are identified. Rib right humeral head unchanged compression deformities within the upper thoracic spine. Avascular necrosis. IMPRESSION: 1. Large masslike area of architectural distortion within the right upper lobe appears is increased in size and increasingly solid worrisome for tumor progression. 2. The right  hilar mass is similar in size to previous exam but there is near new complete atelectasis and consolidation of the right middle lobe. 3. Progressive right lower lobe pulmonary nodule concerning for metastatic disease. 4. Increase in volume of right pleural effusion. Consider further evaluation with diagnostic thoracentesis if there is a concern for malignant effusion. 5. Aortic Atherosclerosis (ICD10-I70.0) and Emphysema (ICD10-J43.9). 6. Multi vessel coronary artery atherosclerotic calcifications. Electronically Signed   By: Kerby Moors M.D.   On: 02/07/2018 16:42   Dg Chest Port 1 View  Result Date: 02/11/2018 CLINICAL DATA:  Increased SOB and congestion today EXAM: PORTABLE CHEST 1 VIEW COMPARISON:  02/10/2018, CT 02/07/2018 FINDINGS: Normal cardiac silhouette. RIGHT basilar atelectasis and effusion unchanged. LEFT lung clear. Masslike consolidation in the RIGHT upper lobe again noted. RIGHT hilar density also unchanged IMPRESSION: 1. No significant interval change. 2. Masslike consolidation in the RIGHT upper lobe and RIGHT hilar density unchanged. 3. RIGHT pleural effusion and basilar atelectasis. Electronically Signed   By: Suzy Bouchard M.D.   On: 02/11/2018 08:11   Dg Chest [facility]  Result Date: 02/09/2018 CLINICAL DATA:  71-year-old female with non-small cell lung cancer status post thoracentesis today with cough. EXAM: PORTABLE CHEST 1 VIEW COMPARISON:  0420 hours today. Chest CT 02/07/2018 and earlier. FINDINGS: Portable AP semi upright view at 1610 hours. Decreased layering in veiling opacity in the right lung with no right pneumothorax identified. Continued confluent right upper lobe airspace disease. Stable cardiac size and mediastinal contours. Postoperative changes to the left hilum. The left lung remains clear allowing for portable technique. Ununited fractures of the posterior right 4th through 6th ribs redemonstrated. IMPRESSION: 1. Decreased right pleural effusion with no  pneumothorax identified following thoracentesis. 2. Otherwise stable chest. Electronically Signed   By: H  Hall M.D.   On: 02/09/2018 16:21   Dg Chest Port 1 View  Result Date: 02/09/2018 CLINICAL DATA:  Shortness of breath EXAM: PORTABLE CHEST 1 VIEW COMPARISON:  02/08/2018 FINDINGS: Increasing pulmonary opacity within the right upper lobe. Persistent right hilar density is noted consistent with the known underlying mass. Right-sided pleural effusion is seen. Left lung is clear. A skin fold lies laterally over the left lung. No bony abnormality is seen. Postsurgical changes are again noted. IMPRESSION: Increasing density in the right upper lobe likely representing some postobstructive change the right hilar mass is again noted and stable. Large right pleural effusion is again seen and stable. Electronically Signed   By: Mark  Lukens M.D.   On: 02/09/2018 07:10   Vas Us Lower Extremity Venous (dvt)  Result Date: 01/29/2018  Lower Venous Study Indications: SOB.  Comparison Study: 12/14/17, 11/09/17 negative studies Performing Technologist: Jill Eunice RDMS, RVT  Examination Guidelines: A complete evaluation includes B-mode imaging, spectral Doppler, color Doppler, and power Doppler as needed of all accessible portions of each vessel. Bilateral testing is considered an integral part of a complete examination. Limited examinations for reoccurring indications may be performed as noted.  Right Venous Findings: +---------+---------------+---------+-----------+----------+-------------------+          CompressibilityPhasicitySpontaneityPropertiesSummary             +---------+---------------+---------+-----------+----------+-------------------+ CFV      Full           Yes      Yes                                      +---------+---------------+---------+-----------+----------+-------------------+ SFJ      Full                                                              +---------+---------------+---------+-----------+----------+-------------------+ FV Prox  Full                                                             +---------+---------------+---------+-----------+----------+-------------------+ FV Mid   Full                                                             +---------+---------------+---------+-----------+----------+-------------------+   FV DistalFull                                                             +---------+---------------+---------+-----------+----------+-------------------+ PFV      Full                                                             +---------+---------------+---------+-----------+----------+-------------------+ POP      Full           Yes      Yes                                      +---------+---------------+---------+-----------+----------+-------------------+ PTV      Full                                                             +---------+---------------+---------+-----------+----------+-------------------+ PERO     Full                                         not well visualized +---------+---------------+---------+-----------+----------+-------------------+  Left Venous Findings: +---------+---------------+---------+-----------+----------+-------------------+          CompressibilityPhasicitySpontaneityPropertiesSummary             +---------+---------------+---------+-----------+----------+-------------------+ CFV      Full           Yes      Yes                                      +---------+---------------+---------+-----------+----------+-------------------+ SFJ      Full                                                             +---------+---------------+---------+-----------+----------+-------------------+ FV Prox  Full                                                              +---------+---------------+---------+-----------+----------+-------------------+ FV Mid   Full                                                             +---------+---------------+---------+-----------+----------+-------------------+  FV DistalFull                                                             +---------+---------------+---------+-----------+----------+-------------------+ PFV      Full                                                             +---------+---------------+---------+-----------+----------+-------------------+ POP      Full           Yes      Yes                                      +---------+---------------+---------+-----------+----------+-------------------+ PTV      Full                                                             +---------+---------------+---------+-----------+----------+-------------------+ PERO     Full                                         not well visualized +---------+---------------+---------+-----------+----------+-------------------+    Summary: Right: There is no evidence of deep vein thrombosis in the lower extremity. However, portions of this examination were limited- see technologist comments above. No cystic structure found in the popliteal fossa. Left: There is no evidence of deep vein thrombosis in the lower extremity. However, portions of this examination were limited- see technologist comments above. No cystic structure found in the popliteal fossa.  *See table(s) above for measurements and observations. Electronically signed by Harold Barban MD on 01/29/2018 at 4:20:39 AM.    Final      Assessment & Plan:   Primary cancer of right upper lobe of lung ([place]) - Continue hospice care and palliative chemotherapy/radiation - Following with Dr. Mohamed/Oncology   Pleural effusion - S/p right ultrasound-guided thoracentesis on 10/31 showing exudative fluid, culture with no growth  - Repeat CXR showed  trace if any residual pleural effusion  COPD (chronic obstructive pulmonary disease) (HCC) - Continue Trelegy 1 puff daily - Albuterol nebulizer every 6 hour as needed for sob/wheezing   Respiratory failure with hypoxia (HCC) - Continue home oxygen  Chronic diastolic heart failure (HCC) BNP normal  Follow 1.5 fluid restriction Continue lasix 80m daily   FU with Dr. BLamonte Sakaiin December   EMartyn Ehrich NP 02/17/2018

## 2018-02-15 NOTE — Patient Instructions (Addendum)
CXR and labs today   1.5 L fluid restriction daily  Stop zyrtec Re-start claritin  Continue ocean nasal spray  Continue hospice and physical therapy   Follow-up [name] next available

## 2018-02-17 ENCOUNTER — Encounter: Payer: Self-pay | Admitting: Primary Care

## 2018-02-17 DIAGNOSIS — I509 Heart failure, unspecified: Secondary | ICD-10-CM | POA: Diagnosis not present

## 2018-02-17 DIAGNOSIS — C3490 Malignant neoplasm of unspecified part of unspecified bronchus or lung: Secondary | ICD-10-CM | POA: Diagnosis not present

## 2018-02-17 DIAGNOSIS — J918 Pleural effusion in other conditions classified elsewhere: Secondary | ICD-10-CM | POA: Diagnosis not present

## 2018-02-17 DIAGNOSIS — J9610 Chronic respiratory failure, unspecified whether with hypoxia or hypercapnia: Secondary | ICD-10-CM | POA: Diagnosis not present

## 2018-02-17 DIAGNOSIS — D6869 Other thrombophilia: Secondary | ICD-10-CM | POA: Diagnosis not present

## 2018-02-17 DIAGNOSIS — J449 Chronic obstructive pulmonary disease, unspecified: Secondary | ICD-10-CM | POA: Diagnosis not present

## 2018-02-17 NOTE — Assessment & Plan Note (Signed)
Continue home oxygen 

## 2018-02-17 NOTE — Assessment & Plan Note (Addendum)
-   Continue hospice care and palliative chemotherapy/radiation - Following with Dr. Mohamed/Oncology

## 2018-02-17 NOTE — Assessment & Plan Note (Addendum)
-   S/p right ultrasound-guided thoracentesis on 10/31 showing exudative fluid, culture with no growth  - Repeat CXR showed trace if any residual pleural effusion

## 2018-02-17 NOTE — Assessment & Plan Note (Signed)
BNP normal  Follow 1.5 fluid restriction Continue lasix 40mg  daily

## 2018-02-17 NOTE — Assessment & Plan Note (Signed)
-   Continue Trelegy 1 puff daily - Albuterol nebulizer every 6 hour as needed for sob/wheezing

## 2018-02-19 DIAGNOSIS — I509 Heart failure, unspecified: Secondary | ICD-10-CM | POA: Diagnosis not present

## 2018-02-19 DIAGNOSIS — J9610 Chronic respiratory failure, unspecified whether with hypoxia or hypercapnia: Secondary | ICD-10-CM | POA: Diagnosis not present

## 2018-02-19 DIAGNOSIS — D6869 Other thrombophilia: Secondary | ICD-10-CM | POA: Diagnosis not present

## 2018-02-19 DIAGNOSIS — J918 Pleural effusion in other conditions classified elsewhere: Secondary | ICD-10-CM | POA: Diagnosis not present

## 2018-02-19 DIAGNOSIS — J449 Chronic obstructive pulmonary disease, unspecified: Secondary | ICD-10-CM | POA: Diagnosis not present

## 2018-02-19 DIAGNOSIS — C3490 Malignant neoplasm of unspecified part of unspecified bronchus or lung: Secondary | ICD-10-CM | POA: Diagnosis not present

## 2018-02-20 DIAGNOSIS — C3490 Malignant neoplasm of unspecified part of unspecified bronchus or lung: Secondary | ICD-10-CM | POA: Diagnosis not present

## 2018-02-20 DIAGNOSIS — J9610 Chronic respiratory failure, unspecified whether with hypoxia or hypercapnia: Secondary | ICD-10-CM | POA: Diagnosis not present

## 2018-02-20 DIAGNOSIS — J449 Chronic obstructive pulmonary disease, unspecified: Secondary | ICD-10-CM | POA: Diagnosis not present

## 2018-02-20 DIAGNOSIS — J918 Pleural effusion in other conditions classified elsewhere: Secondary | ICD-10-CM | POA: Diagnosis not present

## 2018-02-20 DIAGNOSIS — I509 Heart failure, unspecified: Secondary | ICD-10-CM | POA: Diagnosis not present

## 2018-02-20 DIAGNOSIS — D6869 Other thrombophilia: Secondary | ICD-10-CM | POA: Diagnosis not present

## 2018-02-23 ENCOUNTER — Encounter: Payer: Medicare Other | Admitting: Nutrition

## 2018-02-23 ENCOUNTER — Telehealth: Payer: Self-pay | Admitting: Medical Oncology

## 2018-02-23 ENCOUNTER — Encounter: Payer: Self-pay | Admitting: Nutrition

## 2018-02-23 DIAGNOSIS — J918 Pleural effusion in other conditions classified elsewhere: Secondary | ICD-10-CM | POA: Diagnosis not present

## 2018-02-23 DIAGNOSIS — C3490 Malignant neoplasm of unspecified part of unspecified bronchus or lung: Secondary | ICD-10-CM | POA: Diagnosis not present

## 2018-02-23 DIAGNOSIS — J449 Chronic obstructive pulmonary disease, unspecified: Secondary | ICD-10-CM | POA: Diagnosis not present

## 2018-02-23 DIAGNOSIS — J9610 Chronic respiratory failure, unspecified whether with hypoxia or hypercapnia: Secondary | ICD-10-CM | POA: Diagnosis not present

## 2018-02-23 DIAGNOSIS — D6869 Other thrombophilia: Secondary | ICD-10-CM | POA: Diagnosis not present

## 2018-02-23 DIAGNOSIS — I509 Heart failure, unspecified: Secondary | ICD-10-CM | POA: Diagnosis not present

## 2018-02-23 NOTE — Progress Notes (Signed)
Will provide one complimentary case of Ensure enlive to patient. She will pick up on Friday, Nov 15.

## 2018-02-23 NOTE — Telephone Encounter (Signed)
Pt called -she is under Hospice care and said to cancel her future appts. She would like some ensure. Note to Dietician. appts cancelled.

## 2018-02-24 DIAGNOSIS — D6869 Other thrombophilia: Secondary | ICD-10-CM | POA: Diagnosis not present

## 2018-02-24 DIAGNOSIS — J449 Chronic obstructive pulmonary disease, unspecified: Secondary | ICD-10-CM | POA: Diagnosis not present

## 2018-02-24 DIAGNOSIS — C3490 Malignant neoplasm of unspecified part of unspecified bronchus or lung: Secondary | ICD-10-CM | POA: Diagnosis not present

## 2018-02-24 DIAGNOSIS — I509 Heart failure, unspecified: Secondary | ICD-10-CM | POA: Diagnosis not present

## 2018-02-24 DIAGNOSIS — J918 Pleural effusion in other conditions classified elsewhere: Secondary | ICD-10-CM | POA: Diagnosis not present

## 2018-02-24 DIAGNOSIS — J9610 Chronic respiratory failure, unspecified whether with hypoxia or hypercapnia: Secondary | ICD-10-CM | POA: Diagnosis not present

## 2018-02-28 ENCOUNTER — Ambulatory Visit: Payer: Medicare Other | Admitting: Internal Medicine

## 2018-02-28 ENCOUNTER — Telehealth: Payer: Self-pay | Admitting: Primary Care

## 2018-02-28 ENCOUNTER — Other Ambulatory Visit: Payer: Medicare Other

## 2018-02-28 DIAGNOSIS — J918 Pleural effusion in other conditions classified elsewhere: Secondary | ICD-10-CM | POA: Diagnosis not present

## 2018-02-28 DIAGNOSIS — J449 Chronic obstructive pulmonary disease, unspecified: Secondary | ICD-10-CM | POA: Diagnosis not present

## 2018-02-28 DIAGNOSIS — D6869 Other thrombophilia: Secondary | ICD-10-CM | POA: Diagnosis not present

## 2018-02-28 DIAGNOSIS — C3490 Malignant neoplasm of unspecified part of unspecified bronchus or lung: Secondary | ICD-10-CM | POA: Diagnosis not present

## 2018-02-28 DIAGNOSIS — J9610 Chronic respiratory failure, unspecified whether with hypoxia or hypercapnia: Secondary | ICD-10-CM | POA: Diagnosis not present

## 2018-02-28 DIAGNOSIS — I509 Heart failure, unspecified: Secondary | ICD-10-CM | POA: Diagnosis not present

## 2018-02-28 NOTE — Telephone Encounter (Signed)
Called and spoke with Renee with [place] who stated she received a call from pt that the benzonatate and Robitussin meds are not working for her.  Per Joseph Art, pt is saying that the tessalon are now causing her a headache. Pt is requesting some different meds to help with her cough.  [name], please advise on this for pt and Hospice RN [place]. Thanks!

## 2018-02-28 NOTE — Telephone Encounter (Signed)
She can try OTC Delsym, or if she would prefer I am [facility] writing her for hycodan 5cc q6h prn for cough.

## 2018-02-28 NOTE — Telephone Encounter (Signed)
Called and spoke with Renee, patient will do OTC delsym first and if this does not work we can do the cough syrup. Nothing further needed at this time.

## 2018-03-01 ENCOUNTER — Telehealth: Payer: Self-pay | Admitting: Emergency Medicine

## 2018-03-01 DIAGNOSIS — J9610 Chronic respiratory failure, unspecified whether with hypoxia or hypercapnia: Secondary | ICD-10-CM | POA: Diagnosis not present

## 2018-03-01 DIAGNOSIS — J918 Pleural effusion in other conditions classified elsewhere: Secondary | ICD-10-CM | POA: Diagnosis not present

## 2018-03-01 DIAGNOSIS — I509 Heart failure, unspecified: Secondary | ICD-10-CM | POA: Diagnosis not present

## 2018-03-01 DIAGNOSIS — C3490 Malignant neoplasm of unspecified part of unspecified bronchus or lung: Secondary | ICD-10-CM | POA: Diagnosis not present

## 2018-03-01 DIAGNOSIS — J449 Chronic obstructive pulmonary disease, unspecified: Secondary | ICD-10-CM | POA: Diagnosis not present

## 2018-03-01 DIAGNOSIS — D6869 Other thrombophilia: Secondary | ICD-10-CM | POA: Diagnosis not present

## 2018-03-01 MED ORDER — PREDNISONE 10 MG PO TABS: ORAL_TABLET | ORAL | 0 refills | Status: DC

## 2018-03-01 MED ORDER — DOXYCYCLINE HYCLATE 100 MG PO TABS: 100.0000 mg | ORAL_TABLET | Freq: Two times a day (BID) | ORAL | 0 refills | Status: DC

## 2018-03-01 NOTE — Telephone Encounter (Signed)
Called and spoke with Renee letting her know that [facility] stated for pt to do a prednisone taper and then have her continue on 10mg  daily after that and also stated to her that we were going to be calling in an abx for pt as well. Stated to her after next several days, if no improvement, pt would need to call office to schedule an appt.  Renee expressed understanding. meds have been called in to pt's pharmacy. Nothing further needed.

## 2018-03-01 NOTE — Telephone Encounter (Signed)
LMTCB with Renee at [place].

## 2018-03-01 NOTE — Telephone Encounter (Signed)
[name] returning phone call CB# [phone]

## 2018-03-01 NOTE — Telephone Encounter (Signed)
Please have her take prednisone taper as follows: 40mg  qd x 3 days, 30mg  qd x 3 days, 20mg  qd x 3 days then back to her baseline 10mg  daily Please have her take doxycycline 100 mg twice daily for 7 days. She needs to call us if she is not improving over the next several days.  If that is the case then she needs to be seen in the office.

## 2018-03-01 NOTE — Telephone Encounter (Signed)
Called and spoke to [employer], she reports the patient was taking the tessalon pearls and does not feel like they are working, she tool the delsym and it has made her congestions worse and she is having right ear pain. Reports she is having increased SOB worse than her baseline, cough with green mucous. Would like a antibiotic and prednisone. Denies that patient has fever.   RB please advise.

## 2018-03-02 ENCOUNTER — Telehealth: Payer: Self-pay | Admitting: Emergency Medicine

## 2018-03-02 NOTE — Telephone Encounter (Signed)
I think the prednisone is certainly strong enough, she should continue it.  Unclear that there is a bacterial process to treat here, but doxycycline should be adequate.  She may have an evolving URI or even flu - if she has fever would be reasonable to have her flu tested either at our office or via home health.  She can try using symptomatic relief to help with all the drainage to her throat > nasal saline rinses, OTC decongestants such as Thera-flu or Tylenol Cold.

## 2018-03-02 NOTE — Telephone Encounter (Signed)
Called and spoke with pt letting her know the information stated by RB. Pt stated she would come in for an appt to see if she might have something such as the flu or a URI. Pt states she does not feel good at all and has a terrible headache and stated she might have had a low grade temp.  I have scheduled pt for an appt to see Derl Barrow, NP tomorrow, 03/03/18 at 2pm. I did state in the comments for pt to wear a mask and stated she might need to be tested for the flu based on her symptoms.  Nothing further needed.

## 2018-03-02 NOTE — Telephone Encounter (Signed)
Called and spoke to pt.  Pt states she started pred taper and doxy yesterday, as prescribed by RB.  Pt states since starting these medications yesterday, she has developed increase nasal drainage & voice hoarseness.   Denied fever, chills or sweats.  Pt would like recommendations, prior to scheduling OV.  Pt is requesting additional recommendations, as she does not feel that the doxy and pred would be strong enough   RB please advise. Thanks

## 2018-03-03 ENCOUNTER — Encounter: Payer: Self-pay | Admitting: Primary Care

## 2018-03-03 ENCOUNTER — Ambulatory Visit (INDEPENDENT_AMBULATORY_CARE_PROVIDER_SITE_OTHER): Payer: Medicare Other | Admitting: Primary Care

## 2018-03-03 VITALS — BP 120/60 | HR 107 | Temp 98.7°F | Ht 66.0 in | Wt 156.0 lb

## 2018-03-03 DIAGNOSIS — H903 Sensorineural hearing loss, bilateral: Secondary | ICD-10-CM | POA: Diagnosis not present

## 2018-03-03 DIAGNOSIS — I509 Heart failure, unspecified: Secondary | ICD-10-CM | POA: Diagnosis not present

## 2018-03-03 DIAGNOSIS — J918 Pleural effusion in other conditions classified elsewhere: Secondary | ICD-10-CM | POA: Diagnosis not present

## 2018-03-03 DIAGNOSIS — C3411 Malignant neoplasm of upper lobe, right bronchus or lung: Secondary | ICD-10-CM | POA: Diagnosis not present

## 2018-03-03 DIAGNOSIS — D6869 Other thrombophilia: Secondary | ICD-10-CM | POA: Diagnosis not present

## 2018-03-03 DIAGNOSIS — J320 Chronic maxillary sinusitis: Secondary | ICD-10-CM

## 2018-03-03 DIAGNOSIS — J9610 Chronic respiratory failure, unspecified whether with hypoxia or hypercapnia: Secondary | ICD-10-CM | POA: Diagnosis not present

## 2018-03-03 DIAGNOSIS — J441 Chronic obstructive pulmonary disease with (acute) exacerbation: Secondary | ICD-10-CM | POA: Diagnosis not present

## 2018-03-03 DIAGNOSIS — C3490 Malignant neoplasm of unspecified part of unspecified bronchus or lung: Secondary | ICD-10-CM | POA: Diagnosis not present

## 2018-03-03 DIAGNOSIS — J449 Chronic obstructive pulmonary disease, unspecified: Secondary | ICD-10-CM | POA: Diagnosis not present

## 2018-03-03 LAB — POCT INFLUENZA A/B
INFLUENZA A, POC: NEGATIVE
INFLUENZA B, POC: NEGATIVE

## 2018-03-03 MED ORDER — AZELASTINE HCL 137 MCG/SPRAY NA SOLN: 1.0000 | Freq: Every day | NASAL | 1 refills | Status: AC

## 2018-03-03 MED ORDER — HYDROCODONE-ACETAMINOPHEN 10-325 MG PO TABS: 1.0000 | ORAL_TABLET | Freq: Two times a day (BID) | ORAL | 0 refills | Status: DC | PRN

## 2018-03-03 NOTE — Assessment & Plan Note (Signed)
Mild COPD exacerbation d/t sinusitis symptoms  Abx and prednisone taper Continue Trelegy and oxygen 2-3 L

## 2018-03-03 NOTE — Progress Notes (Signed)
@Patient  ID: [name], female    DOB: 1946/03/11, 72 y.o.   MRN: [id number]  Chief Complaint  Patient presents with  . Acute Visit    cough with yellow mucus, headache, right ear hurts, SOB all the time especially with lying down x1-2 weeks    Referring provider: [name], DO  HPI: 72 year old female, former smoker quit 1999. 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 Hx left lobectomy. Patient of [name]. Followed by [name] with oncology, last seen on July 25th. Patient continues with Nivolumab 275m IV every 2 weeks.  Repeat CHEST CT W/contrast reviewed by Dr. MJulien Nordmannshowing no concerning findings for progression. There is a new RLL nodule 659m Recommended repeating every 3 months.  12/26/17 -ROV/Dr. ByLamonte Sakaihis follow-up visit for [age]ear old woman with severe COPD, stage III adenocarcinoma with residual right-sided disease, right pleural effusion, associated hypoxemic respiratory failure.  She also has hypertension with diastolic dysfunction.  She was hospitalized in late August 08/30-09/05 with progressive dyspnea, edema.  She was diuresed with improvement.  She was also treated empirically for possible exacerbation of COPD.  A right thoracentesis was done on 12/12/2017 and fluid appear to be most consistent with a transudate, culture negative, cytology negative. She reports that she is having some increased cough, nasal congestion, white sputum. She is on loratadine, omeprazole. She is on trelegy, uses albuterol nebs about 2x a day. Remains on lasix 4020maily, was started on lisinopril during the hospitalization. She finished her pred taper end of last week.   01/10/2018 Last seen on 09/16 for hospital follow-up for dyspnea and COPD exac. She was diuresed with improvement  and is s/p right thoracentesis. Started on [employer]isinopril change to valsartan 88m76mr Dr. ByruLamonte Sakainsideration for low dose daily prednisone.  Patient presents today for acute visit, complains of not feeling well. Called office on 09/23 with reports of fever 99.6 and productive cough with brown sputum. Started on Levaquin x 7 days. She is taking Levquin daily, has one day left. Feels more congested. Cough is mostly clear, started yellow today. Not coughing as much today, states its slowing down. Wheezing in the morning, Taking Allegra, Mucinex 1200mg16mce daily. Low grade temp 99.1, she's been taking tylenol. Doesn't have much of an appetite, making herself eat. Drinking fluids and staying hydrated. Had gatoraid last night. Feet have been swelling. Takes lasix 40mg 59my daily. Oxygen remains stable. Loose stool, not diarrhea. Denies urinary frequency, dysuria, N/V, abdominal pain. Rapid flu negative. Plan obtain stat CXR and labs. BP 100/50 (124/64). Plan hold Diovan, restart 1/2 tabs if >140/80.   01/13/2018 Patient presents today for 3 days follow-up visit for COPD exacerbation. CXR showed right upper lung mass/consolidation without significant change. Associated right hilar adenopathy may be present. Associated small right pleural effusion again noted. Mild left upper lobe subsegmental atelectasis. Rapid flu negative. Labs were normal. Levaquin extended for 3 additional days and started prednisone 20mg x29mays. Plan to consider placing patient on daily prednisone, discussed with Dr. Byrum. Lamonte Sakai00/50 three days ago, holding diovan. Consider restarting at lower dose.    Patient still complains of sob. Reports that she has been having panic attacks. Takes xanax 1mg to 56mep and half tab prn. Difficulty managing her medications, looking for an easy medication chart to follow. Anxiety is driven by  her fear of not being in control and knowing she is not getting better. Complains of arthritic pain and  fibromyalgia, takes Tramadol prn and if that does not help will take Norco pain medication. Continues to have some leg swelling, states that she has to keep feet elevated. Reports that she has been taking 61m of lasix. Has not been wearig compression stockings. Aid comes at 11am and can help her put stockings on. Lots of nasal congestion and PND. Lung sounds mostly clear. Occ insp wheeze. Breathing is not labored at rest. Tearful today.   02/17/2018 Patient presents today for 4 week follow-up office visit, accompanied by home health aid. Since last being seen in early October she was hospitalized twice, for presumed CAP treated with IV cefepime and Levaquin and moderate-large right sided recurrent pleural effusion. She has hx right sided non-small cell lung cancer with right lung mets. Hilar involvement with right sided lung collapse/atelectasis and right sided pleural effusion. Underwent ultrasound-guided thoracentesis on 10/31 showing exudative fluid. She was discharged home with hospice on 02/11/18. Goal of care now is comfort, cont palliative chemotherapy or radiation.   States that she feels sob all the time. O2 sat 97% on 3.5 L oxygen. Continues Trelegy daily and using nebulizer 2-3 times a day. Prednisone 122mdaily. Needs refill of tessalon perles. Complains of nasal drainage, white mucus color. Feels congestion is worse since being on Zyrtec, prefers Claritin. Uses ocean nasal spray. Has pain in rib/back on right side, taking tramadol as needed. Has Norco available if pain not controlled.   03/03/2018 Patient presents today for an acute visit with complaints of right ear pain, nasal congestion, cough and shortness of breath.  She is accompanied by her son.  Continues to receive hospice.  Needs refill of her Norco.  She was started on a course of doxycycline and prednisone 2 days ago over the phone.  Called yesterday stating she still did not feel better.  Requesting flu swab.   Allergies  Allergen  Reactions  . Iohexol Shortness Of Breath and Other (See Comments)    "burns me up inside"    Pt does fine with 13 hour prep  01/17/12  . Other     CAN'T STAND THE SMELL OF PURE BLEACH; "HAVE TO BACK UP AND POUR AND DON'T BREATH IT TIL i GET CAP BACK ON; DILUTE IT TO SPRAY"  . Anoro Ellipta [Umeclidinium-Vilanterol] Swelling    Swelling of lips and mouth  . Codeine Itching and Nausea And Vomiting    REACTION: GI upset  . Protonix [Pantoprazole Sodium] Diarrhea    Immunization History  Administered Date(s) Administered  . Influenza Split 02/08/2007, 03/19/2011, 01/27/2012  . Influenza Whole 01/02/2010  . Influenza, High Dose Seasonal PF 02/03/2017  . Influenza,inj,Quad PF,6+ Mos 12/27/2012, 02/12/2014, 01/22/2016  . Pneumococcal Conjugate-13 02/14/2017  . Pneumococcal Polysaccharide-23 03/27/2013  . Tdap 02/14/2017    Past Medical History:  Diagnosis Date  . Adenocarcinoma, lung ([facility]   upper left lobe  . Anemia in neoplastic disease   . Anxiety   . Arthritis    "all over my body"  . Asthma   . Asymptomatic varicose veins   . Atherosclerosis of native arteries of the extremities, unspecified   . Atrial fibrillation ([facility]  . Bronchial pneumonia    history  . 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 Chemotherapy adverse reaction 09/15/11   "makes me light headed and pass out; this is 3rd blood transfusion since  going thru it"  . CHF (congestive heart failure) ([place])   . Chronic airway obstruction, not elsewhere classified   . Chronic fatigue 08/19/2015  . Chronic sinusitis   . Complication of anesthesia    slow to wake up  . COPD with emphysema ([place])   . Coronary artery disease    Dr. Burt Knack had stress test done  . DDD (degenerative disc disease), lumbar   . Depressive disorder, not elsewhere classified   . Diseases of lips   . Disorder of bone and cartilage, unspecified   . DVT (deep venous thrombosis) ([place])   . Fibromyalgia   . Functional urinary incontinence   .  GERD (gastroesophageal reflux disease)   . Hard of hearing, right   . Herpes zoster without mention of complication   . Hip pain, chronic 09/17/2014  . History of blood clots    "2 in my aorta"  . History of blood transfusion   . History of bronchitis   . History of echocardiogram    Echo 11/18: mild LVH, EF 60-65, no RWMA, Gr 1 DD, Ao Sclerosis, mild AI, RVH with low nl RSVF, trivial eff  . Hx of radiation therapy 04/28/11 -06/08/11   right upper lobe-lung  . Hyperlipidemia   . Hypertension   . Kidney infection    "related to chemo"  . Lumbago   . Malignant neoplasm of bronchus and lung, unspecified site   . Mental disorder   . Muscle weakness (generalized)   . Non-small cell lung cancer ([place]) 03/31/2011   Adenocarcinoma Rt upper lobe mass  . Osteoarthrosis, unspecified whether generalized or localized, unspecified site   . Osteoporosis   . Other abnormal blood chemistry   . Other and unspecified hyperlipidemia   . PAD (peripheral artery disease) ([place])   . Pain in thoracic spine   . 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 Primary cancer of right upper lobe of lung ([place]) 02/08/2011   Biopsy Rt upper lobe mass 03/31/11>>adenocarcinoma DIAGNOSIS: Stage IIB/IIIA non-small cell lung cancer consistent with adenocarcinoma diagnosed in December 2012.   PRIOR THERAPY:  1) Status post radiation therapy to the right upper lobe lung mass completed on 06/04/2011 under the care of Dr. Pablo Ledger.  2) Systemic chemotherapy with carboplatin for AUC of 5 and Alimta 500 mg/M2 giving every 3   . Reflux   . Reflux esophagitis   . Respiratory failure with hypoxia 01/02/2010  . Restless legs syndrome (RLS)   . Scoliosis   . Screening for thyroid disorder   . Shortness of breath    "anytime really"  . Sinus headache    "all the time"  . Spasm of muscle   . Syncope and collapse   . Type II or unspecified type diabetes mellitus with neurological manifestations, uncontrolled(250.62)   . Type II or  unspecified type diabetes mellitus without mention of complication, not stated as uncontrolled   . Type II or unspecified type diabetes mellitus without mention of complication, not stated as uncontrolled   . Unspecified constipation   . Unspecified essential hypertension   . Unspecified hearing loss   . Unspecified hereditary and idiopathic peripheral neuropathy   . Unspecified vitamin D deficiency     Tobacco History: Social History   Tobacco Use  Smoking Status Former Smoker  . Packs/day: 1.00  . Years: 40.00  . Pack years: 40.00  . Types: Cigarettes  . Last attempt to quit: 02/10/1998  . Years since quitting: 20.0  Smokeless Tobacco Never Used  Counseling given: Not Answered   Outpatient Medications Prior to Visit  Medication Sig Dispense Refill  . ACCU-CHEK AVIVA PLUS test strip USE AS DIRECTED TO CHECK BLOOD SUGAR THREE TIMES DAILY 100 each 2  . albuterol (PROVENTIL HFA;VENTOLIN HFA) 108 (90 Base) MCG/ACT inhaler Inhale 2 puffs into the lungs every 6 (six) hours as needed for wheezing or shortness of breath. 1 Inhaler 6  . albuterol (PROVENTIL) (2.5 MG/3ML) 0.083% nebulizer solution USE 1 VIAL VIA NEBULIZER EVERY 6 HOURS AS NEEDED FOR WHEEZING OR SHORTNESS OF BREATH (Patient taking differently: Take 2.5 mg by nebulization every 6 (six) hours as needed for wheezing or shortness of breath. ) 75 mL 3  . ALPRAZolam (XANAX) 0.5 MG tablet TAKE 1 TABLET BY MOUTH 3 TIMES A DAY AS NEEDED FOR ANXIETY 10 tablet 0  . aspirin 81 MG tablet Take 81 mg by mouth daily.    . benzonatate (TESSALON PERLES) 100 MG capsule Take 1 capsule (100 mg total) by mouth every 6 (six) hours as needed for cough. 30 capsule 1  . Blood Glucose Monitoring Suppl (ACCU-CHEK AVIVA PLUS) w/Device KIT Use as directed to check blood sugar Dx: E11.9, E11.49 1 kit 0  . Calcium Carbonate-Vit D-Min (CALTRATE PLUS PO) Take 1 tablet by mouth every other day.     Marland Kitchen doxycycline (VIBRA-TABS) 100 MG tablet Take 1 tablet (100  mg total) by mouth 2 (two) times daily. 14 tablet 0  . guaiFENesin-dextromethorphan (ROBITUSSIN DM) 100-10 MG/5ML syrup Take 5 mLs by mouth every 4 (four) hours as needed for cough. 118 mL 0  . loratadine (CLARITIN) 10 MG tablet Take 1 tablet (10 mg total) by mouth daily. 30 tablet 0  . LORazepam (ATIVAN) 0.5 MG tablet Take 0.5 mg by mouth every 4 (four) hours as needed for anxiety.    . metFORMIN (GLUCOPHAGE) 500 MG tablet Take 1 tablet (500 mg total) by mouth 2 (two) times daily.    . metoprolol tartrate (LOPRESSOR) 25 MG tablet Take 1 tablet (25 mg total) by mouth 2 (two) times daily. 180 tablet 3  . nitroGLYCERIN (NITROSTAT) 0.4 MG SL tablet Place 1 tablet (0.4 mg total) under the tongue every 5 (five) minutes as needed. 25 tablet 3  . ondansetron (ZOFRAN) 4 MG tablet Take 1 tablet (4 mg total) by mouth every 8 (eight) hours as needed for nausea or vomiting. 20 tablet 1  . OXYGEN Inhale 3-4 L into the lungs.     . pantoprazole (PROTONIX) 40 MG tablet Take 1 tablet (40 mg total) by mouth 2 (two) times daily before a meal. 60 tablet 0  . potassium chloride (K-DUR,KLOR-CON) 10 MEQ tablet Take 1 tablet (10 mEq total) by mouth daily. 90 tablet 3  . predniSONE (DELTASONE) 10 MG tablet Take 1 tablet (10 mg total) by mouth daily with breakfast. 30 tablet 6  . predniSONE (DELTASONE) 10 MG tablet Take 11mx3days,30mgx3days,20mgx3days,then continue on 11m27 tablet 0  . Respiratory Therapy Supplies (FLUTTER) DEVI Use a directed 1 each 0  . senna (SENOKOT) 8.6 MG tablet Take 1 tablet by mouth 2 (two) times daily as needed for constipation.     . simethicone (MYLICON) 80 MG chewable tablet Chew 1 tablet (80 mg total) by mouth 4 (four) times daily as needed for flatulence. 30 tablet 0  . sodium chloride (OCEAN) 0.65 % SOLN nasal spray Place 1 spray into both nostrils as needed for congestion.  0  . traMADol (ULTRAM) 50 MG tablet TAKE 2 TABLETS (100MG TOTAL)  BY MOUTH EVERY 6 HOURS AS NEEDED FOR SEVERE PAIN  (Patient taking differently: Take 100 mg by mouth every 6 (six) hours as needed for moderate pain. ) 90 tablet 2  . TRELEGY ELLIPTA 100-62.5-25 MCG/INH AEPB INHALE 1 PUFF BY MOUTH ONCE DAILY (Patient taking differently: Inhale 1 puff into the lungs daily. ) 60 each 5  . valsartan (DIOVAN) 40 MG tablet Take 1 tablet (40 mg total) by mouth daily. 90 tablet 3  . HYDROcodone-acetaminophen (NORCO) 10-325 MG tablet Take 1 tablet by mouth 2 (two) times daily as needed. 60 tablet 0  . furosemide (LASIX) 40 MG tablet Take 1 tablet (40 mg total) by mouth daily. 90 tablet 3   Facility-Administered Medications Prior to Visit  Medication Dose Route Frequency Provider Last Rate Last Dose  . hyaluronate sodium (RADIAPLEXRX) gel   Topical Once Thea Silversmith, MD      . topical emolient (BIAFINE) emulsion   Topical PRN Thea Silversmith, MD        Review of Systems  Review of Systems  Constitutional: Negative.   HENT: Positive for congestion, ear pain and sinus pressure.   Respiratory: Positive for cough, shortness of breath and wheezing.   Cardiovascular: Negative.     Physical Exam  BP 120/60 (BP Location: Left Arm, Cuff Size: Normal)   Pulse (!) 107   Temp 98.7 F (37.1 C)   Ht 5' 6"  (1.676 m)   Wt 156 lb (70.8 kg)   SpO2 92%   BMI 25.18 kg/m    Physical Exam  Constitutional: She is oriented to person, place, and time. She appears well-developed and well-nourished.  Chronically ill appearing   HENT:  Head: Normocephalic and atraumatic.  Eyes: Pupils are equal, round, and reactive to light. EOM are normal.  Neck: Normal range of motion. Neck supple.  Cardiovascular: Normal rate and regular rhythm.  Pulmonary/Chest: Effort normal. No respiratory distress. She has wheezes.  Exp wheeze, on oxygen  Neurological: She is alert and oriented to person, place, and time.  Gait not assessed, in [facility]  Skin: Skin is warm and dry.  Psychiatric: She has a normal mood and affect. Her behavior is  normal. Judgment and thought content normal.     Lab Results:  CBC    Component Value Date/Time   WBC 10.1 02/15/2018 1551   RBC 3.62 (L) 02/15/2018 1551   HGB 10.7 (L) 02/15/2018 1551   HGB 10.7 (L) 02/07/2018 1108   HGB 12.7 04/07/2017 1015   HCT 32.6 (L) 02/15/2018 1551   HCT 39.0 04/07/2017 1015   PLT 296.0 02/15/2018 1551   PLT 280 02/07/2018 1108   PLT 203 04/07/2017 1015   MCV 90.1 02/15/2018 1551   MCV 90.1 04/07/2017 1015   MCH 27.9 02/09/2018 0419   MCHC 32.9 02/15/2018 1551   RDW 15.0 02/15/2018 1551   RDW 13.5 04/07/2017 1015   LYMPHSABS 0.7 02/08/2018 1342   LYMPHSABS 2.1 04/07/2017 1015   MONOABS 0.4 02/08/2018 1342   MONOABS 0.7 04/07/2017 1015   EOSABS 0.0 02/08/2018 1342   EOSABS 1.1 (H) 04/07/2017 1015   EOSABS 0.4 08/06/2015 1036   BASOSABS 0.0 02/08/2018 1342   BASOSABS 0.0 04/07/2017 1015    BMET    Component Value Date/Time   NA 137 02/15/2018 1551   NA 144 01/23/2018 1406   NA 141 04/07/2017 1016   K 4.2 02/15/2018 1551   K 3.4 (L) 04/07/2017 1016   CL 98 02/15/2018 1551   CL 99 07/21/2012  1125   CO2 30 02/15/2018 1551   CO2 27 04/07/2017 1016   GLUCOSE 175 (H) 02/15/2018 1551   GLUCOSE 93 04/07/2017 1016   GLUCOSE 184 (H) 07/21/2012 1125   BUN 21 02/15/2018 1551   BUN 12 01/23/2018 1406   BUN 13.8 04/07/2017 1016   CREATININE 1.14 02/15/2018 1551   CREATININE 1.14 (H) 02/07/2018 1108   CREATININE 1.1 04/07/2017 1016   CALCIUM 9.1 02/15/2018 1551   CALCIUM 9.5 04/07/2017 1016   GFRNONAA 43 (L) 02/10/2018 0306   GFRNONAA 47 (L) 02/07/2018 1108   GFRAA 50 (L) 02/10/2018 0306   GFRAA 54 (L) 02/07/2018 1108    BNP    Component Value Date/Time   BNP 39.5 01/27/2018 1526    ProBNP    Component Value Date/Time   PROBNP 36.0 02/15/2018 1551    Imaging: Dg Chest 2 View  Result Date: 02/16/2018 CLINICAL DATA:  72 year old female with non-small cell lung cancer. Cough and shortness of breath. Thoracentesis on 02/09/2018. EXAM:  CHEST - 2 VIEW COMPARISON:  02/11/2018 and earlier. FINDINGS: Seated AP and lateral views of the chest. Stable lung volumes and confluent right upper lobe opacity since 01/27/2018. Trace if any residual pleural fluid. Postoperative changes to both lungs and the mediastinum. Stable cardiac size and mediastinal contours. Visualized tracheal air column is within normal limits. No pneumothorax or new pulmonary opacity. Stable visualized osseous structures. Calcified aortic atherosclerosis. Paucity of bowel gas in the upper abdomen. IMPRESSION: 1. Trace if any residual pleural effusion. Otherwise chest since 01/27/2018. 2. No new cardiopulmonary abnormality. Electronically Signed   By: Genevie Ann M.D.   On: 02/16/2018 09:13   Dg Chest 2 View  Result Date: 02/10/2018 CLINICAL DATA:  Shortness of breath. EXAM: CHEST - 2 VIEW COMPARISON:  02/09/2018 FINDINGS: The cardiomediastinal silhouette is unchanged in the setting of a known right hilar mass. Postoperative changes are again noted in the left hilar region. Masslike consolidation in the right upper lobe is stable to slightly increased. There is a persistent small right pleural effusion. The left lung remains clear. No pneumothorax is identified. T4 and T5 compression fractures are again noted with prior augmentation at T4. There is mild thoracic levoscoliosis. IMPRESSION: Stable to slightly increased masslike consolidation in the right upper lobe. Small right pleural effusion. Electronically Signed   By: Logan Bores M.D.   On: 02/10/2018 09:48   Dg Chest 2 View  Result Date: 02/08/2018 CLINICAL DATA:  Increasing dyspnea over the past few days. EXAM: CHEST - 2 VIEW COMPARISON:  CT 02/07/2018 FINDINGS: Normal heart size with aortic atherosclerosis. Redemonstration of pulmonary consolidation in the right upper lobe abutting the major fissure with right hilar prominence suspicious for postobstructive change. Emphysematous changes are noted of both lungs, upper lobe  predominant. Probable trace right effusion or thickening. 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 Chronic compression deformities in the upper thoracic spine with one level kyphoplasty noted. IMPRESSION: Redemonstration of pulmonary confluent opacities in the right upper lobe with right hilar prominence suspicious for postobstructive change. Underlying malignancy is not excluded. Blunting of the right costophrenic angle may reflect a small effusion or thickening. AVN of left humeral head. Chronic moderate compression deformities of the upper thoracic spine with one level kyphoplasty noted. Electronically Signed   By: Ashley Royalty M.D.   On: 02/08/2018 14:52   Ct Chest Wo Contrast  Result Date: 02/07/2018 CLINICAL DATA:  Followup non-small cell lung cancer. EXAM: CT CHEST WITHOUT  CONTRAST TECHNIQUE: Multidetector CT imaging of the chest was performed following the standard protocol without IV contrast. COMPARISON:  11/03/2017 FINDINGS: Cardiovascular: The heart size appears within normal limits. No pericardial effusion. Aortic atherosclerosis. Calcification within the LAD, left circumflex coronary arteries noted. Mediastinum/Nodes: Normal appearance of the thyroid gland. The trachea appears patent and is midline. Normal appearance of the esophagus. No enlarged axillary or supraclavicular lymph nodes. No mediastinal adenopathy identified. Within the limitations of unenhanced technique the previous right hilar mass measures 3.1 cm, image 59/2. Previously 3.0 cm. Lungs/Pleura: The lungs are emphysematous. There is a moderate partially loculated right pleural effusion which has increased in volume from the previous examination. Within the right upper lobe there is a large area of progressive masslike architectural distortion measuring 5.6 x 8.4 by 5.0 cm, image 66/2. Previously this appeared less solid measuring 7.5 by 5.2 by 3.9 cm. Surrounding ground-glass attenuation  is noted which appears progressive from previous exam. There is new complete postobstructive atelectasis of the right middle lobe, image 81/5. Multiple new or enlarging nodules are identified within the right lower lobe. The largest nodule measures 1.8 cm, image 120/5. New from previous exam. Also new from previous exam is a 1.6 cm right lower lobe lung nodule, image 134/5. There are mild patchy areas of ground-glass attenuation within the left lung. Upper Abdomen: No acute abnormality. Gallstone again noted. Small bilateral low-attenuation adrenal nodules are favored to represent adenomas. Unchanged. Musculoskeletal: Multiple chronic right posterior rib deformities are identified. Rib right humeral head unchanged compression deformities within the upper thoracic spine. Avascular necrosis. IMPRESSION: 1. Large masslike area of architectural distortion within the right upper lobe appears is increased in size and increasingly solid worrisome for tumor progression. 2. The right hilar mass is similar in size to previous exam but there is near new complete atelectasis and consolidation of the right middle lobe. 3. Progressive right lower lobe pulmonary nodule concerning for metastatic disease. 4. Increase in volume of right pleural effusion. Consider further evaluation with diagnostic thoracentesis if there is a concern for malignant effusion. 5. Aortic Atherosclerosis (ICD10-I70.0) and Emphysema (ICD10-J43.9). 6. Multi vessel coronary artery atherosclerotic calcifications. Electronically Signed   By: Kerby Moors M.D.   On: 02/07/2018 16:42   Dg Chest Port 1 View  Result Date: 02/11/2018 CLINICAL DATA:  Increased SOB and congestion today EXAM: PORTABLE CHEST 1 VIEW COMPARISON:  02/10/2018, CT 02/07/2018 FINDINGS: Normal cardiac silhouette. RIGHT basilar atelectasis and effusion unchanged. LEFT lung clear. Masslike consolidation in the RIGHT upper lobe again noted. RIGHT hilar density also unchanged IMPRESSION: 1.  No significant interval change. 2. Masslike consolidation in the RIGHT upper lobe and RIGHT hilar density unchanged. 3. RIGHT pleural effusion and basilar atelectasis. Electronically Signed   By: Suzy Bouchard M.D.   On: 02/11/2018 08:11   Dg Chest Port 1 View  Result Date: 02/09/2018 CLINICAL DATA:  72 year old female with non-small cell lung cancer status post thoracentesis today with cough. EXAM: PORTABLE CHEST 1 VIEW COMPARISON:  0420 hours today. Chest CT 02/07/2018 and earlier. FINDINGS: Portable AP semi upright view at 1610 hours. Decreased layering in veiling opacity in the right lung with no right pneumothorax identified. Continued confluent right upper lobe airspace disease. Stable cardiac size and mediastinal contours. Postoperative changes to the left hilum. The left lung remains clear allowing for portable technique. Ununited fractures of the posterior right 4th through 6th ribs redemonstrated. IMPRESSION: 1. Decreased right pleural effusion with no pneumothorax identified following thoracentesis. 2. Otherwise stable chest. Electronically  Signed   By: Genevie Ann M.D.   On: 02/09/2018 16:21   Dg Chest Port 1 View  Result Date: 02/09/2018 CLINICAL DATA:  Shortness of breath EXAM: PORTABLE CHEST 1 VIEW COMPARISON:  02/08/2018 FINDINGS: Increasing pulmonary opacity within the right upper lobe. Persistent right hilar density is noted consistent with the known underlying mass. Right-sided pleural effusion is seen. Left lung is clear. A skin fold lies laterally over the left lung. No bony abnormality is seen. Postsurgical changes are again noted. IMPRESSION: Increasing density in the right upper lobe likely representing some postobstructive change the right hilar mass is again noted and stable. Large right pleural effusion is again seen and stable. Electronically Signed   By: Inez Catalina M.D.   On: 02/09/2018 07:10     Assessment & Plan:   Sinusitis, chronic Continue doxycyline course and  prednisone taper as previously prescribed  Uses warm compresses to nasal bridge and humidification  Azelastine nasal spray 1 puff per nostril daily for 2-3 days only  Flu swab negative   COPD with acute exacerbation (HCC) Mild COPD exacerbation d/t sinusitis symptoms  Abx and prednisone taper Continue Trelegy and oxygen 2-3 L  Primary cancer of right upper lobe of lung ([place]) Continues with Hospice  Refill Norco twice daily for chronic pain    Martyn Ehrich, NP 03/03/2018

## 2018-03-03 NOTE — Assessment & Plan Note (Addendum)
Continue doxycyline course and prednisone taper as previously prescribed  Uses warm compresses to nasal bridge and humidification  Azelastine nasal spray 1 puff per nostril daily for 2-3 days only  Flu swab negative

## 2018-03-03 NOTE — Patient Instructions (Addendum)
Continue doxycyline course and prednisone  Uses warm compresses to nasal bridge and humidification   Azelastine nasal spray 1 puff per nostril daily for 2-3 days only   Norco twice daily for chronic pain

## 2018-03-03 NOTE — Assessment & Plan Note (Signed)
Continues with Hospice  Refill Norco twice daily for chronic pain

## 2018-03-05 DIAGNOSIS — D6869 Other thrombophilia: Secondary | ICD-10-CM | POA: Diagnosis not present

## 2018-03-05 DIAGNOSIS — I509 Heart failure, unspecified: Secondary | ICD-10-CM | POA: Diagnosis not present

## 2018-03-05 DIAGNOSIS — C3490 Malignant neoplasm of unspecified part of unspecified bronchus or lung: Secondary | ICD-10-CM | POA: Diagnosis not present

## 2018-03-05 DIAGNOSIS — J9610 Chronic respiratory failure, unspecified whether with hypoxia or hypercapnia: Secondary | ICD-10-CM | POA: Diagnosis not present

## 2018-03-05 DIAGNOSIS — J449 Chronic obstructive pulmonary disease, unspecified: Secondary | ICD-10-CM | POA: Diagnosis not present

## 2018-03-05 DIAGNOSIS — J918 Pleural effusion in other conditions classified elsewhere: Secondary | ICD-10-CM | POA: Diagnosis not present

## 2018-03-06 DIAGNOSIS — J9610 Chronic respiratory failure, unspecified whether with hypoxia or hypercapnia: Secondary | ICD-10-CM | POA: Diagnosis not present

## 2018-03-06 DIAGNOSIS — C3490 Malignant neoplasm of unspecified part of unspecified bronchus or lung: Secondary | ICD-10-CM | POA: Diagnosis not present

## 2018-03-06 DIAGNOSIS — J918 Pleural effusion in other conditions classified elsewhere: Secondary | ICD-10-CM | POA: Diagnosis not present

## 2018-03-06 DIAGNOSIS — J449 Chronic obstructive pulmonary disease, unspecified: Secondary | ICD-10-CM | POA: Diagnosis not present

## 2018-03-06 DIAGNOSIS — I509 Heart failure, unspecified: Secondary | ICD-10-CM | POA: Diagnosis not present

## 2018-03-06 DIAGNOSIS — D6869 Other thrombophilia: Secondary | ICD-10-CM | POA: Diagnosis not present

## 2018-03-06 NOTE — Progress Notes (Signed)
   Subjective:    Patient ID: [name], female    DOB: May 06, 1945, 72 y.o.   MRN: [id number]  HPI    Review of Systems     Objective:   Physical Exam        Assessment & Plan:

## 2018-03-07 ENCOUNTER — Telehealth: Payer: Self-pay | Admitting: Emergency Medicine

## 2018-03-07 NOTE — Telephone Encounter (Signed)
Advised Mucinex and flutter valve. Please order if she does not have one. I don't know if lasix would be beneficial for her complaints, she should speak with cardiology regarding that

## 2018-03-07 NOTE — Telephone Encounter (Signed)
Pt is calling back [phone]

## 2018-03-07 NOTE — Telephone Encounter (Signed)
Called and spoke with pt who states she has phlegm in her throat that is making her choke. Pt stated she had to use her neb machine at 1:30 and 3:30 this morning and has had no relief from this.  Pt states she take one of her 40mg  lasix around 4:30am but pt is wanting to know if it would be okay for her to take another tab of lasix but break it in half to take half the dose due to just taking one recently.  Pt states she just took Robitussin but has had no relief from that. Pt is scared from the choking and is concerned due to this and wants to know what she needs to do.  Due to RB being out of the office, sending to [employer] as she was the most recent one to see pt. [name], please advise on this for pt. Thanks!

## 2018-03-07 NOTE — Telephone Encounter (Signed)
Left message for patient to call back  

## 2018-03-07 NOTE — Telephone Encounter (Signed)
Called and spoke with the patient and daughter she is aware of recommendation and nothing further is needed at this time.

## 2018-03-08 DIAGNOSIS — C3490 Malignant neoplasm of unspecified part of unspecified bronchus or lung: Secondary | ICD-10-CM | POA: Diagnosis not present

## 2018-03-08 DIAGNOSIS — J449 Chronic obstructive pulmonary disease, unspecified: Secondary | ICD-10-CM | POA: Diagnosis not present

## 2018-03-08 DIAGNOSIS — D6869 Other thrombophilia: Secondary | ICD-10-CM | POA: Diagnosis not present

## 2018-03-08 DIAGNOSIS — J9610 Chronic respiratory failure, unspecified whether with hypoxia or hypercapnia: Secondary | ICD-10-CM | POA: Diagnosis not present

## 2018-03-08 DIAGNOSIS — I509 Heart failure, unspecified: Secondary | ICD-10-CM | POA: Diagnosis not present

## 2018-03-08 DIAGNOSIS — J918 Pleural effusion in other conditions classified elsewhere: Secondary | ICD-10-CM | POA: Diagnosis not present

## 2018-03-08 NOTE — Telephone Encounter (Signed)
Called and spoke with [facility], advised her of recommendations. Holding off on the scopolamine patch for now because patient will not take the patch due to it causing her pupils to dilate and it will "dry her up too much". Will route back to [facility] as an FYI so she knows we do not need to order the patch.

## 2018-03-08 NOTE — Telephone Encounter (Signed)
Spoke with [name], we have optimized almost everything we can to help her control mucus production. No indication for antibiotic and she has been on them recently.  Where is the mucus coming from, nasal or chest??? Make sure she is using nasal steroid, nasal saline rinse and taking an antihistamine daily. Let me know if she is using these three. If she is and still having difficulties we could try scopolamine patch to help dry up her secretions.

## 2018-03-08 NOTE — Telephone Encounter (Signed)
Called and spoke with Joseph Art, Hospice Nurse.  [place] recommendations given.  She stated that the Patient has a flutter valve and uses it regularly.  She stated that the Patient does not like Mucinex, because it makes her heart race and flutter.  She is requesting a antibiotic.  Will route to Derl Barrow, NP

## 2018-03-08 NOTE — Telephone Encounter (Signed)
Pt Hospice RN Rickey Uzma states she is calling back from yesterday pervious message CB# [phone]

## 2018-03-08 NOTE — Telephone Encounter (Signed)
Yup that's fine, thank you

## 2018-03-12 DIAGNOSIS — J918 Pleural effusion in other conditions classified elsewhere: Secondary | ICD-10-CM | POA: Diagnosis not present

## 2018-03-12 DIAGNOSIS — E1159 Type 2 diabetes mellitus with other circulatory complications: Secondary | ICD-10-CM | POA: Diagnosis not present

## 2018-03-12 DIAGNOSIS — D6869 Other thrombophilia: Secondary | ICD-10-CM | POA: Diagnosis not present

## 2018-03-12 DIAGNOSIS — I4892 Unspecified atrial flutter: Secondary | ICD-10-CM | POA: Diagnosis not present

## 2018-03-12 DIAGNOSIS — C3490 Malignant neoplasm of unspecified part of unspecified bronchus or lung: Secondary | ICD-10-CM | POA: Diagnosis not present

## 2018-03-12 DIAGNOSIS — I509 Heart failure, unspecified: Secondary | ICD-10-CM | POA: Diagnosis not present

## 2018-03-12 DIAGNOSIS — I2510 Atherosclerotic heart disease of native coronary artery without angina pectoris: Secondary | ICD-10-CM | POA: Diagnosis not present

## 2018-03-12 DIAGNOSIS — J9610 Chronic respiratory failure, unspecified whether with hypoxia or hypercapnia: Secondary | ICD-10-CM | POA: Diagnosis not present

## 2018-03-12 DIAGNOSIS — J449 Chronic obstructive pulmonary disease, unspecified: Secondary | ICD-10-CM | POA: Diagnosis not present

## 2018-03-12 DIAGNOSIS — I10 Essential (primary) hypertension: Secondary | ICD-10-CM | POA: Diagnosis not present

## 2018-03-12 DIAGNOSIS — F339 Major depressive disorder, recurrent, unspecified: Secondary | ICD-10-CM | POA: Diagnosis not present

## 2018-03-13 ENCOUNTER — Telehealth: Payer: Self-pay | Admitting: Emergency Medicine

## 2018-03-13 DIAGNOSIS — J918 Pleural effusion in other conditions classified elsewhere: Secondary | ICD-10-CM | POA: Diagnosis not present

## 2018-03-13 DIAGNOSIS — J449 Chronic obstructive pulmonary disease, unspecified: Secondary | ICD-10-CM | POA: Diagnosis not present

## 2018-03-13 DIAGNOSIS — D6869 Other thrombophilia: Secondary | ICD-10-CM | POA: Diagnosis not present

## 2018-03-13 DIAGNOSIS — I509 Heart failure, unspecified: Secondary | ICD-10-CM | POA: Diagnosis not present

## 2018-03-13 DIAGNOSIS — C3490 Malignant neoplasm of unspecified part of unspecified bronchus or lung: Secondary | ICD-10-CM | POA: Diagnosis not present

## 2018-03-13 DIAGNOSIS — J9610 Chronic respiratory failure, unspecified whether with hypoxia or hypercapnia: Secondary | ICD-10-CM | POA: Diagnosis not present

## 2018-03-13 MED ORDER — HYDROCODONE-HOMATROPINE 5-1.5 MG/5ML PO SYRP: ORAL_SOLUTION | ORAL | 0 refills | Status: DC

## 2018-03-13 NOTE — Telephone Encounter (Signed)
OK with me to try using the hydromet cough syrup, 5-10cc up to every 6 hours if needed for cough, disp #240cc, 0RF

## 2018-03-13 NOTE — Telephone Encounter (Signed)
[place] calling concerning pt cough/congestion. Requesting a new RX be prescribed for patient. CB# [phone]

## 2018-03-13 NOTE — Telephone Encounter (Signed)
Called and spoke with pt letting her know that [place] stated it was okay for [place] to prescribe hydromet to see if that would help with her cough. Pt expressed understanding. I stated to pt someone would have to come to our office to pick up the Rx as we cannot call this med into the pharmacy for her. Pt stated she would call her daughter to see if she could pick up the Rx.  Rx has been printed and signed by RB and has been placed up front for pt.  Renee, Hospice RN has been called so she can update pt's med list with them. Nothing further needed.

## 2018-03-13 NOTE — Telephone Encounter (Signed)
Called Joseph Art, Hospice [place] RN. Stated to [facility] that we are awaiting a response from [place] in regards to meds that we can send in for pt.  Renee expressed understanding. She wanted to make it known that if RB wanted to send in some hydromet/hydrocodone cough syrup to see if that would help pt, even though pt has hydrocodone-acetaminophen prescribed for her pain, she has not had to use that as the tramadol has been working to control her pain.  Routing back to [name]. Please advise. Thanks!

## 2018-03-13 NOTE — Telephone Encounter (Signed)
Called and spoke with patient (also see phone note from 11/26) patient is requesting something to be called in for her cough and congestion. White mucus production. No other symptoms present. No fever.   RB please advise, thank you.

## 2018-03-15 ENCOUNTER — Telehealth: Payer: Self-pay | Admitting: Emergency Medicine

## 2018-03-15 DIAGNOSIS — D6869 Other thrombophilia: Secondary | ICD-10-CM | POA: Diagnosis not present

## 2018-03-15 DIAGNOSIS — J449 Chronic obstructive pulmonary disease, unspecified: Secondary | ICD-10-CM | POA: Diagnosis not present

## 2018-03-15 DIAGNOSIS — I509 Heart failure, unspecified: Secondary | ICD-10-CM | POA: Diagnosis not present

## 2018-03-15 DIAGNOSIS — J918 Pleural effusion in other conditions classified elsewhere: Secondary | ICD-10-CM | POA: Diagnosis not present

## 2018-03-15 DIAGNOSIS — J9610 Chronic respiratory failure, unspecified whether with hypoxia or hypercapnia: Secondary | ICD-10-CM | POA: Diagnosis not present

## 2018-03-15 DIAGNOSIS — C3490 Malignant neoplasm of unspecified part of unspecified bronchus or lung: Secondary | ICD-10-CM | POA: Diagnosis not present

## 2018-03-15 MED ORDER — DOXYCYCLINE HYCLATE 100 MG PO TABS: 100.0000 mg | ORAL_TABLET | Freq: Two times a day (BID) | ORAL | 0 refills | Status: AC

## 2018-03-15 NOTE — Telephone Encounter (Signed)
Spoke with the pt  She is requesting something to be called in to help her breathing  She states feels like she is "smothering"- coughing up large amounts of white sputum  She states her ears are aching also and she has a lot of "congestion in my head"  She declined appt  Hospice pt  Please advise, thanks!

## 2018-03-15 NOTE — Telephone Encounter (Signed)
Not clear to me that antibiotics can be helpful based on what she is reporting.  All the same I do not think there is much downside to trying to treat to see if she can get benefit without having to come in for an office visit.  Please give her doxycycline 100 mg twice daily for 7 days.  If she continues to have the symptoms that she needs to come in to be seen because we are not figuring this out over the telephone.

## 2018-03-15 NOTE — Telephone Encounter (Signed)
atc Renee, stated that she was driving in her car, then line went dead.  atc back, line rang fast busy signal.  Wcb.

## 2018-03-15 NOTE — Telephone Encounter (Signed)
Called and spoke with patient, made aware of RB recommendations. Confirmed pharmacy. Order sent for Doxycycline. I explained to patient if this did not help her symptoms she would need to come into the office for a face to face visit. Patient voices understanding. Nothing further is needed at this time.

## 2018-03-16 NOTE — Telephone Encounter (Signed)
Called and spoke with [name], Therapist, sports. [name] is wanting to know if it would be okay for pt to take mucinex and if so what strength.  [name], please advise on this for pt and Renee. Thanks!

## 2018-03-17 NOTE — Telephone Encounter (Signed)
Yes - 600mg  bid with lots of water.

## 2018-03-17 NOTE — Telephone Encounter (Signed)
Call made to [facility], made aware of RB recommendations: Yes -Mucinex  600mg  bid with lots of water.   Voiced understanding. Nothing further is needed at this time.

## 2018-03-19 DIAGNOSIS — J918 Pleural effusion in other conditions classified elsewhere: Secondary | ICD-10-CM | POA: Diagnosis not present

## 2018-03-19 DIAGNOSIS — J449 Chronic obstructive pulmonary disease, unspecified: Secondary | ICD-10-CM | POA: Diagnosis not present

## 2018-03-19 DIAGNOSIS — I509 Heart failure, unspecified: Secondary | ICD-10-CM | POA: Diagnosis not present

## 2018-03-19 DIAGNOSIS — C3490 Malignant neoplasm of unspecified part of unspecified bronchus or lung: Secondary | ICD-10-CM | POA: Diagnosis not present

## 2018-03-19 DIAGNOSIS — D6869 Other thrombophilia: Secondary | ICD-10-CM | POA: Diagnosis not present

## 2018-03-19 DIAGNOSIS — J9610 Chronic respiratory failure, unspecified whether with hypoxia or hypercapnia: Secondary | ICD-10-CM | POA: Diagnosis not present

## 2018-03-21 DIAGNOSIS — J918 Pleural effusion in other conditions classified elsewhere: Secondary | ICD-10-CM | POA: Diagnosis not present

## 2018-03-21 DIAGNOSIS — C3490 Malignant neoplasm of unspecified part of unspecified bronchus or lung: Secondary | ICD-10-CM | POA: Diagnosis not present

## 2018-03-21 DIAGNOSIS — D6869 Other thrombophilia: Secondary | ICD-10-CM | POA: Diagnosis not present

## 2018-03-21 DIAGNOSIS — J9610 Chronic respiratory failure, unspecified whether with hypoxia or hypercapnia: Secondary | ICD-10-CM | POA: Diagnosis not present

## 2018-03-21 DIAGNOSIS — I509 Heart failure, unspecified: Secondary | ICD-10-CM | POA: Diagnosis not present

## 2018-03-21 DIAGNOSIS — J449 Chronic obstructive pulmonary disease, unspecified: Secondary | ICD-10-CM | POA: Diagnosis not present

## 2018-03-22 ENCOUNTER — Telehealth: Payer: Self-pay | Admitting: Emergency Medicine

## 2018-03-22 DIAGNOSIS — I509 Heart failure, unspecified: Secondary | ICD-10-CM | POA: Diagnosis not present

## 2018-03-22 DIAGNOSIS — C3490 Malignant neoplasm of unspecified part of unspecified bronchus or lung: Secondary | ICD-10-CM | POA: Diagnosis not present

## 2018-03-22 DIAGNOSIS — D6869 Other thrombophilia: Secondary | ICD-10-CM | POA: Diagnosis not present

## 2018-03-22 DIAGNOSIS — J9610 Chronic respiratory failure, unspecified whether with hypoxia or hypercapnia: Secondary | ICD-10-CM | POA: Diagnosis not present

## 2018-03-22 DIAGNOSIS — J449 Chronic obstructive pulmonary disease, unspecified: Secondary | ICD-10-CM | POA: Diagnosis not present

## 2018-03-22 DIAGNOSIS — J918 Pleural effusion in other conditions classified elsewhere: Secondary | ICD-10-CM | POA: Diagnosis not present

## 2018-03-22 MED ORDER — GUAIFENESIN ER 600 MG PO TB12: 600.0000 mg | ORAL_TABLET | Freq: Two times a day (BID) | ORAL | 3 refills | Status: AC

## 2018-03-22 NOTE — Telephone Encounter (Signed)
Called and spoke with [facility], she stated that the medication needed to be sent in as a 15 day supply. Medication sent in. Nothing further needed.

## 2018-03-24 ENCOUNTER — Encounter: Payer: Self-pay | Admitting: Pulmonary Disease

## 2018-03-24 ENCOUNTER — Other Ambulatory Visit (INDEPENDENT_AMBULATORY_CARE_PROVIDER_SITE_OTHER): Payer: Medicare Other

## 2018-03-24 ENCOUNTER — Ambulatory Visit (INDEPENDENT_AMBULATORY_CARE_PROVIDER_SITE_OTHER): Admitting: Pulmonary Disease

## 2018-03-24 VITALS — BP 140/58 | HR 128 | Ht 65.0 in | Wt 150.4 lb

## 2018-03-24 DIAGNOSIS — J90 Pleural effusion, not elsewhere classified: Secondary | ICD-10-CM | POA: Diagnosis not present

## 2018-03-24 DIAGNOSIS — J441 Chronic obstructive pulmonary disease with (acute) exacerbation: Secondary | ICD-10-CM

## 2018-03-24 DIAGNOSIS — J9611 Chronic respiratory failure with hypoxia: Secondary | ICD-10-CM

## 2018-03-24 DIAGNOSIS — R0602 Shortness of breath: Secondary | ICD-10-CM | POA: Diagnosis not present

## 2018-03-24 DIAGNOSIS — J918 Pleural effusion in other conditions classified elsewhere: Secondary | ICD-10-CM | POA: Diagnosis not present

## 2018-03-24 DIAGNOSIS — D6869 Other thrombophilia: Secondary | ICD-10-CM | POA: Diagnosis not present

## 2018-03-24 DIAGNOSIS — I509 Heart failure, unspecified: Secondary | ICD-10-CM | POA: Diagnosis not present

## 2018-03-24 DIAGNOSIS — J9610 Chronic respiratory failure, unspecified whether with hypoxia or hypercapnia: Secondary | ICD-10-CM | POA: Diagnosis not present

## 2018-03-24 DIAGNOSIS — J449 Chronic obstructive pulmonary disease, unspecified: Secondary | ICD-10-CM | POA: Diagnosis not present

## 2018-03-24 DIAGNOSIS — C3490 Malignant neoplasm of unspecified part of unspecified bronchus or lung: Secondary | ICD-10-CM | POA: Diagnosis not present

## 2018-03-24 LAB — CBC WITH DIFFERENTIAL/PLATELET
BASOS ABS: 0 10*3/uL (ref 0.0–0.1)
Basophils Relative: 0.2 % (ref 0.0–3.0)
EOS PCT: 0 % (ref 0.0–5.0)
Eosinophils Absolute: 0 10*3/uL (ref 0.0–0.7)
HEMATOCRIT: 33.5 % — AB (ref 36.0–46.0)
Hemoglobin: 11 g/dL — ABNORMAL LOW (ref 12.0–15.0)
LYMPHS PCT: 7.7 % — AB (ref 12.0–46.0)
Lymphs Abs: 1.2 10*3/uL (ref 0.7–4.0)
MCHC: 33 g/dL (ref 30.0–36.0)
MCV: 88.3 fl (ref 78.0–100.0)
MONOS PCT: 4.5 % (ref 3.0–12.0)
Monocytes Absolute: 0.7 10*3/uL (ref 0.1–1.0)
NEUTROS ABS: 14.1 10*3/uL — AB (ref 1.4–7.7)
Neutrophils Relative %: 87.6 % — ABNORMAL HIGH (ref 43.0–77.0)
PLATELETS: 368 10*3/uL (ref 150.0–400.0)
RBC: 3.79 Mil/uL — ABNORMAL LOW (ref 3.87–5.11)
RDW: 14.3 % (ref 11.5–15.5)
WBC: 16.1 10*3/uL — ABNORMAL HIGH (ref 4.0–10.5)

## 2018-03-24 LAB — COMPREHENSIVE METABOLIC PANEL
ALT: 11 U/L (ref 0–35)
AST: 14 U/L (ref 0–37)
Albumin: 3.6 g/dL (ref 3.5–5.2)
Alkaline Phosphatase: 72 U/L (ref 39–117)
BILIRUBIN TOTAL: 0.5 mg/dL (ref 0.2–1.2)
BUN: 18 mg/dL (ref 6–23)
CALCIUM: 8.1 mg/dL — AB (ref 8.4–10.5)
CHLORIDE: 89 meq/L — AB (ref 96–112)
CO2: 31 mEq/L (ref 19–32)
Creatinine, Ser: 1.02 mg/dL (ref 0.40–1.20)
GFR: 56.3 mL/min — AB (ref >60.00)
Glucose, Bld: 138 mg/dL — ABNORMAL HIGH (ref 70–99)
POTASSIUM: 3.4 meq/L — AB (ref 3.5–5.1)
Sodium: 134 mEq/L — ABNORMAL LOW (ref 135–145)
Total Protein: 6.3 g/dL (ref 6.0–8.3)

## 2018-03-24 MED ORDER — FLUTICASONE-UMECLIDIN-VILANT 100-62.5-25 MCG/INH IN AEPB: 1.0000 | INHALATION_SPRAY | Freq: Every day | RESPIRATORY_TRACT | 0 refills | Status: AC

## 2018-03-24 MED ORDER — AZITHROMYCIN 250 MG PO TABS: ORAL_TABLET | ORAL | 0 refills | Status: DC

## 2018-03-24 MED ORDER — PREDNISONE 10 MG PO TABS: ORAL_TABLET | ORAL | 0 refills | Status: DC

## 2018-03-24 MED ORDER — FLUTICASONE-UMECLIDIN-VILANT 100-62.5-25 MCG/INH IN AEPB: 1.0000 | INHALATION_SPRAY | Freq: Every day | RESPIRATORY_TRACT | 3 refills | Status: DC

## 2018-03-24 NOTE — Assessment & Plan Note (Signed)
Chest Xray today  >>>[facility]   Labwork today  >>>[facility]   Based off the [place] of medicine article on March 09, 2018 -Volume 381, #22, pages 2125-2134. >>>Patients with low clinical pretest probably based off of Wells score and a d-dimer less than 1000, does not need further imaging work-up for pulmonary embolism >>>Patients with moderate clinical pretest probability based off of Wells score and a d-dimer less than 500, does not need further imaging work-up for pulmonary embolism   Keep follow up with Dr Lamonte Sakai next week

## 2018-03-24 NOTE — Assessment & Plan Note (Signed)
History of pleural effusion  We will get chest x-ray today

## 2018-03-24 NOTE — Patient Instructions (Addendum)
Azithromycin 250mg  tablet  >>>Take 2 tablets (500mg  total) today, and then 1 tablet (250mg ) for the next four days  >>>take with food  >>>can also take probiotic and / or yogurt while on antibiotic   Prednisone 10mg  tablet  >>>Take 2 tablets (20 mg total) daily for the next 5 days, then resume 10mg  daily >>> Take with food in the morning  Chest Xray today  >>>[facility]   Labwork today  >>>[facility]   Restart Trelegy Ellipta  >>> 1 puff daily in the morning >>>rinse mouth out after use  >>> This inhaler contains 3 medications that help manage her respiratory status, contact our office if you cannot afford this medication or unable to remain on this medication >>> Do this every day no matter what  Only use your albuterol as a rescue medication to be used if you can't catch your breath by resting or doing a relaxed purse lip breathing pattern.  - The less you use it, the better it will work when you need it. - Ok to use up to 2 puffs  every 4 hours if you must but call for immediate appointment if use goes up over your usual need - Don't leave home without it !!  (think of it like the spare tire for your car)   Albuterol nebulizers can be used every 6 hours as needed for shortness of breath and wheezing  Continue oxygen therapy as prescribed  >>>maintain oxygen saturations greater than 88 percent  >>>if unable to maintain oxygen saturations please contact the office  >>>do not smoke with oxygen  >>>can use nasal saline gel or nasal saline rinses to moisturize nose if oxygen causes dryness    Keep follow up with Dr Lamonte Sakai next week     It is flu season:   >>>Remember to be washing your hands regularly, using hand sanitizer, be careful to use around herself with has contact with people who are sick will increase her chances of getting sick yourself. >>> Best ways to protect herself from the flu: Receive the yearly flu vaccine, practice good hand hygiene washing with soap and  also using hand sanitizer when available, eat a nutritious meals, get adequate rest, hydrate appropriately   Please contact the office if your symptoms worsen or you have concerns that you are not improving.   Thank you for choosing [facility] for your healthcare, and for allowing [place] to partner with you on your healthcare journey. I am thankful to be able to provide care to you today.   Wyn Quaker FNP-C

## 2018-03-24 NOTE — Assessment & Plan Note (Signed)
Azithromycin 250mg  tablet  >>>Take 2 tablets (500mg  total) today, and then 1 tablet (250mg ) for the next four days  >>>take with food  >>>can also take probiotic and / or yogurt while on antibiotic   Prednisone 10mg  tablet  >>>Take 2 tablets (20 mg total) daily for the next 5 days, then resume 10mg  daily >>> Take with food in the morning  Chest Xray today  >>>[facility]   Labwork today  >>>[facility]   Restart Trelegy Ellipta  >>> 1 puff daily in the morning >>>rinse mouth out after use  >>> This inhaler contains 3 medications that help manage her respiratory status, contact our office if you cannot afford this medication or unable to remain on this medication >>> Do this every day no matter what  Only use your albuterol as a rescue medication to be used if you can't catch your breath by resting or doing a relaxed purse lip breathing pattern.  - The less you use it, the better it will work when you need it. - Ok to use up to 2 puffs  every 4 hours if you must but call for immediate appointment if use goes up over your usual need - Don't leave home without it !!  (think of it like the spare tire for your car)   Albuterol nebulizers can be used every 6 hours as needed for shortness of breath and wheezing  Keep follow up with Dr Lamonte Sakai next week

## 2018-03-24 NOTE — Progress Notes (Addendum)
@Patient  ID: [name], female    DOB: 1945/04/28, 72 y.o.   MRN: [id number]  Chief Complaint  Patient presents with  . Acute Visit    SOB    Referring provider: [name], DO  HPI:  72 year old female former smoker (quit 1999) followed in office for severe COPD, chronic hypoxic respiratory failure  PMH: Recurrent non-small cell lung cancer stage IIa adenocarcinoma initially diagnosed as unresectable in 2012 status post chemotherapy and immunotherapy, prev underwent immunotherapy treatment with Nivolumb.  History of left lobectomy. Pt has stopped immunetherapy. Patient was formerly followed by [name]. Smoker/ Smoking History: Former smoker.  Quit 1999.  40-pack-year smoking history. Maintenance: Tr elegy Ellipta  Pt of: [name]  Patient is currently followed at home on hospice  03/24/2018  - Visit   72 year old female patient presenting today with her daughter for an acute visit.  Patient reporting she has had increased shortness of breath over the last 2 weeks.  Patient was recently treated telephonically with doxycycline.  Patient reports that sputum color has improved from yellow mucus to white during that course.  Patient still feels progressively short of breath and has had wheezing.  Patient continues to follow with hospice and the hospice nurse visits once a week.  Patient also has a hospice aide that visits on Sundays to help give her a bath.  Patient continues to be on oxygen has not needed increased oxygen needs.  Patient also reports that 2 days ago she had increased right upper lobe lung pain.  Patient says she feels like she is having pneumonia.  Patient does have a past medical history of pleural effusions.  Unfortunately patient has not been taking her Trelegy Ellipta inhaler for the last 2 to 3 weeks as patient reports she did not realize she was supposed to remain on that.  Patient is only been using albuterol nebulizers and she reports she has been  feeling very jittery and she does not like taking his medications.   Tests:   03/03/2018-flu swab-negative 02/15/2018-BNP-36 02/15/2018-CBC-hemoglobin 10.7 02/16/2018-chest x-ray-trace if any residual pleural effusion 02/07/2018-CT chest without contrast- large masslike area of architectural distortion within the right upper lobe appears increased in size and increasingly solid worrisome for tumor progression, right hilar mass is similar in size to previous exam but there is a near new complete atelectasis and consolidation of right middle lobe, progressive right lower lobe pulmonary nodule concerning for metastatic disease, increase in volume of right pleural effusion 12/11/2017-echocardiogram-LV ejection fraction 60 to 75%, grade 1 diastolic dysfunction, PA P pressure 33  A right thoracentesis was done on 12/12/2017 and fluid appear to be most consistent with a transudate, culture negative, cytology negative.  Underwent ultrasound-guided thoracentesis on 10/31 showing exudative fluid.  FENO:  No results found for: NITRICOXIDE  PFT: No flowsheet data found.  Imaging: No results found.  Chart Review:    Specialty Problems      Pulmonary Problems   COPD (chronic obstructive pulmonary disease) (HCC)    Spirometry 12/26/07>> FEV1 1.84(73%), FEV1% 69  Spirometry 12/08/09>> FEV1 1.75(67%), FEV1% 64 Spirometry 11/26/10>>FEV1 1.91(77%), FEV1% 68 Spirometry 02/08/11>>FEV1 1.50(61%), FEV1% 65 Unable to tolerate spiriva Sinus CT 02/02/2011 Clear paranasal sinuses. PFT 04/19/10>>FEV1 1.80(80%), FEV1% 60, TLC 5.10(97%), DLCO 42%, +BD         Sinusitis, chronic    Sinus CT 02/02/2011 Clear paranasal sinuses.       Respiratory failure with hypoxia (HCC)    ONO on room air 01/02/10>>Test time 9  hrs 31 min, mean SpO2 89%, low SpO2 71%, spent 3 hrs 13 min (33%) with SpO2 < 88% ONO on room air 02/11/11>>Test time 10 hrs 58 min, mean SpO2 89.2%, low SpO2 79%, spent 4 hrs 3 min with SpO2 <  88% Start home oxygen 02/15/11 2 liters oxygen with exertion and sleep.       Primary cancer of right upper lobe of lung ([place])    History of stage IIB/IIIA non-small cell lung cancer December 2012 status post concurrent chemoradiation followed by consolidation chemotherapy with carboplatin and Alimta.  Molecular study for EGFR mutation negative.  Evidence of disease progression with continuous enlargement of a right upper lobe opacity on CT 12/02/2014.  She is status post systemic chemotherapy with carboplatin and Alimta x 2 cycles with treatment subsequently placed on hold secondary to increasing fatigue and weakness as well as pancytopenia.  Restaging scan of the chest performed on 04/07/2016 showed mild improvement.  CT scan of the chest performed on 08/12/2015 showed evidence for disease progression in the right lung.  Treatment initiated with nivolumab 08/27/2015. Discharged from hospital on 02/11/18 with hospice, continue with palliative chemo/radiation       Dyspnea   Chronic cough   Oropharyngeal dysphagia   Community acquired pneumonia   COPD with acute exacerbation ([place])   Pleural effusion   HCAP (healthcare-associated pneumonia)   Lung cancer ([place])      Allergies  Allergen Reactions  . Iohexol Shortness Of Breath and Other (See Comments)    "burns me up inside"    Pt does fine with 13 hour prep  01/17/12  . Other     CAN'T STAND THE SMELL OF PURE BLEACH; "HAVE TO BACK UP AND POUR AND DON'T BREATH IT TIL i GET CAP BACK ON; DILUTE IT TO SPRAY"  . Anoro Ellipta [Umeclidinium-Vilanterol] Swelling    Swelling of lips and mouth  . Codeine Itching and Nausea And Vomiting    REACTION: GI upset  . Protonix [Pantoprazole Sodium] Diarrhea    Immunization History  Administered Date(s) Administered  . Influenza Split 02/08/2007, 03/19/2011, 01/27/2012  . Influenza Whole 01/02/2010  . Influenza, High Dose Seasonal PF 02/03/2017  . Influenza,inj,Quad PF,6+ Mos 12/27/2012,  02/12/2014, 01/22/2016  . Pneumococcal Conjugate-13 02/14/2017  . Pneumococcal Polysaccharide-23 03/27/2013  . Tdap 02/14/2017    Past Medical History:  Diagnosis Date  . Adenocarcinoma, lung ([place])    upper left lobe  . Anemia in neoplastic disease   . Anxiety   . Arthritis    "all over my body"  . Asthma   . Asymptomatic varicose veins   . Atherosclerosis of native arteries of the extremities, unspecified   . Atrial fibrillation ([place])   . Bronchial pneumonia    history  . 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 Chemotherapy adverse reaction 09/15/11   "makes me light headed and pass out; this is 3rd blood transfusion since going thru it"  . CHF (congestive heart failure) ([place])   . Chronic airway obstruction, not elsewhere classified   . Chronic fatigue 08/19/2015  . Chronic sinusitis   . Complication of anesthesia    slow to wake up  . COPD with emphysema ([place])   . Coronary artery disease    Dr. Burt Knack had stress test done  . DDD (degenerative disc disease), lumbar   . Depressive disorder, not elsewhere classified   . Diseases of lips   . Disorder of bone and cartilage, unspecified   . DVT (  deep venous thrombosis) ([place])   . Fibromyalgia   . Functional urinary incontinence   . GERD (gastroesophageal reflux disease)   . Hard of hearing, right   . Herpes zoster without mention of complication   . Hip pain, chronic 09/17/2014  . History of blood clots    "2 in my aorta"  . History of blood transfusion   . History of bronchitis   . History of echocardiogram    Echo 11/18: mild LVH, EF 60-65, no RWMA, Gr 1 DD, Ao Sclerosis, mild AI, RVH with low nl RSVF, trivial eff  . Hx of radiation therapy 04/28/11 -06/08/11   right upper lobe-lung  . Hyperlipidemia   . Hypertension   . Kidney infection    "related to chemo"  . Lumbago   . Malignant neoplasm of bronchus and lung, unspecified site   . Mental disorder   . Muscle weakness (generalized)   . Non-small cell lung cancer  ([place]) 03/31/2011   Adenocarcinoma Rt upper lobe mass  . Osteoarthrosis, unspecified whether generalized or localized, unspecified site   . Osteoporosis   . Other abnormal blood chemistry   . Other and unspecified hyperlipidemia   . PAD (peripheral artery disease) ([place])   . Pain in thoracic spine   . 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 Primary cancer of right upper lobe of lung ([place]) 02/08/2011   Biopsy Rt upper lobe mass 03/31/11>>adenocarcinoma DIAGNOSIS: Stage IIB/IIIA non-small cell lung cancer consistent with adenocarcinoma diagnosed in December 2012.   PRIOR THERAPY:  1) Status post radiation therapy to the right upper lobe lung mass completed on 06/04/2011 under the care of Dr. Pablo Ledger.  2) Systemic chemotherapy with carboplatin for AUC of 5 and Alimta 500 mg/M2 giving every 3   . Reflux   . Reflux esophagitis   . Respiratory failure with hypoxia 01/02/2010  . Restless legs syndrome (RLS)   . Scoliosis   . Screening for thyroid disorder   . Shortness of breath    "anytime really"  . Sinus headache    "all the time"  . Spasm of muscle   . Syncope and collapse   . Type II or unspecified type diabetes mellitus with neurological manifestations, uncontrolled(250.62)   . Type II or unspecified type diabetes mellitus without mention of complication, not stated as uncontrolled   . Type II or unspecified type diabetes mellitus without mention of complication, not stated as uncontrolled   . Unspecified constipation   . Unspecified essential hypertension   . Unspecified hearing loss   . Unspecified hereditary and idiopathic peripheral neuropathy   . Unspecified vitamin D deficiency     Tobacco History: Social History   Tobacco Use  Smoking Status Former Smoker  . Packs/day: 1.00  . Years: 40.00  . Pack years: 40.00  . Types: Cigarettes  . Last attempt to quit: 02/10/1998  . Years since quitting: 20.1  Smokeless Tobacco Never Used   Counseling given: Yes  Continue to  not smoke  Outpatient Encounter Medications as of 03/24/2018  Medication Sig  . ACCU-CHEK AVIVA PLUS test strip USE AS DIRECTED TO CHECK BLOOD SUGAR THREE TIMES DAILY  . albuterol (PROVENTIL HFA;VENTOLIN HFA) 108 (90 Base) MCG/ACT inhaler Inhale 2 puffs into the lungs every 6 (six) hours as needed for wheezing or shortness of breath.  Marland Kitchen albuterol (PROVENTIL) (2.5 MG/3ML) 0.083% nebulizer solution USE 1 VIAL VIA NEBULIZER EVERY 6 HOURS AS NEEDED FOR WHEEZING OR SHORTNESS OF BREATH (Patient taking differently: Take 2.5 mg by  nebulization every 6 (six) hours as needed for wheezing or shortness of breath. )  . ALPRAZolam (XANAX) 0.5 MG tablet TAKE 1 TABLET BY MOUTH 3 TIMES A DAY AS NEEDED FOR ANXIETY  . aspirin 81 MG tablet Take 81 mg by mouth daily.  . Azelastine HCl 137 MCG/SPRAY SOLN Place 1 puff into the nose daily.  . benzonatate (TESSALON PERLES) 100 MG capsule Take 1 capsule (100 mg total) by mouth every 6 (six) hours as needed for cough.  . Blood Glucose Monitoring Suppl (ACCU-CHEK AVIVA PLUS) w/Device KIT Use as directed to check blood sugar Dx: E11.9, E11.49  . Calcium Carbonate-Vit D-Min (CALTRATE PLUS PO) Take 1 tablet by mouth every other day.   Marland Kitchen guaiFENesin (MUCINEX) 600 MG 12 hr tablet Take 1 tablet (600 mg total) by mouth 2 (two) times daily.  Marland Kitchen guaiFENesin-dextromethorphan (ROBITUSSIN DM) 100-10 MG/5ML syrup Take 5 mLs by mouth every 4 (four) hours as needed for cough.  Marland Kitchen HYDROcodone-acetaminophen (NORCO) 10-325 MG tablet Take 1 tablet by mouth 2 (two) times daily as needed.  Marland Kitchen HYDROcodone-homatropine (HYDROMET) 5-1.5 MG/5ML syrup Take 5-10cc every 6 hours if needed for cough  . loratadine (CLARITIN) 10 MG tablet Take 1 tablet (10 mg total) by mouth daily.  Marland Kitchen LORazepam (ATIVAN) 0.5 MG tablet Take 0.5 mg by mouth every 4 (four) hours as needed for anxiety.  . metFORMIN (GLUCOPHAGE) 500 MG tablet Take 1 tablet (500 mg total) by mouth 2 (two) times daily.  . metoprolol tartrate  (LOPRESSOR) 25 MG tablet Take 1 tablet (25 mg total) by mouth 2 (two) times daily.  . nitroGLYCERIN (NITROSTAT) 0.4 MG SL tablet Place 1 tablet (0.4 mg total) under the tongue every 5 (five) minutes as needed.  . ondansetron (ZOFRAN) 4 MG tablet Take 1 tablet (4 mg total) by mouth every 8 (eight) hours as needed for nausea or vomiting.  . OXYGEN Inhale 3-4 L into the lungs.   . pantoprazole (PROTONIX) 40 MG tablet Take 1 tablet (40 mg total) by mouth 2 (two) times daily before a meal.  . potassium chloride (K-DUR,KLOR-CON) 10 MEQ tablet Take 1 tablet (10 mEq total) by mouth daily.  . predniSONE (DELTASONE) 10 MG tablet Take 1 tablet (10 mg total) by mouth daily with breakfast.  . Respiratory Therapy Supplies (FLUTTER) DEVI Use a directed  . senna (SENOKOT) 8.6 MG tablet Take 1 tablet by mouth 2 (two) times daily as needed for constipation.   . simethicone (MYLICON) 80 MG chewable tablet Chew 1 tablet (80 mg total) by mouth 4 (four) times daily as needed for flatulence.  . sodium chloride (OCEAN) 0.65 % SOLN nasal spray Place 1 spray into both nostrils as needed for congestion.  . traMADol (ULTRAM) 50 MG tablet TAKE 2 TABLETS (100MG TOTAL) BY MOUTH EVERY 6 HOURS AS NEEDED FOR SEVERE PAIN (Patient taking differently: Take 100 mg by mouth every 6 (six) hours as needed for moderate pain. )  . valsartan (DIOVAN) 40 MG tablet Take 1 tablet (40 mg total) by mouth daily.  Marland Kitchen azithromycin (ZITHROMAX) 250 MG tablet 533m (two tablets) today, then 2570m(1 tablet) for the next 4 days  . Fluticasone-Umeclidin-Vilant (TRELEGY ELLIPTA) 100-62.5-25 MCG/INH AEPB Inhale 1 puff into the lungs daily.  . Fluticasone-Umeclidin-Vilant (TRELEGY ELLIPTA) 100-62.5-25 MCG/INH AEPB Inhale 1 puff into the lungs daily.  . furosemide (LASIX) 40 MG tablet Take 1 tablet (40 mg total) by mouth daily.  . predniSONE (DELTASONE) 10 MG tablet 4 tabs for 2 days, then 3  tabs for 2 days, 2 tabs for 2 days, then 1 tab for 2 days, then stop    . TRELEGY ELLIPTA 100-62.5-25 MCG/INH AEPB INHALE 1 PUFF BY MOUTH ONCE DAILY (Patient not taking: No sig reported)  . [DISCONTINUED] calcium carbonate 200 MG capsule Take 250 mg by mouth daily. Does not take everyday  . [DISCONTINUED] doxycycline (VIBRA-TABS) 100 MG tablet Take 1 tablet (100 mg total) by mouth 2 (two) times daily. (Patient not taking: Reported on 03/24/2018)  . [DISCONTINUED] predniSONE (DELTASONE) 10 MG tablet Take 13mx3days,30mgx3days,20mgx3days,then continue on 122m(Patient not taking: Reported on 03/24/2018)   Facility-Administered Encounter Medications as of 03/24/2018  Medication  . hyaluronate sodium (RADIAPLEXRX) gel  . topical emolient (BIAFINE) emulsion     Review of Systems  Review of Systems  Constitutional: Positive for chills and fatigue. Negative for fever and unexpected weight change.  HENT: Positive for congestion, postnasal drip, sinus pressure and sinus pain. Negative for ear pain.   Respiratory: Positive for cough (white / yellow tinge ), chest tightness, shortness of breath and wheezing.   Cardiovascular: Negative for chest pain and palpitations.  Gastrointestinal: Positive for diarrhea and nausea. Negative for blood in stool and vomiting.  Genitourinary: Negative for dysuria, frequency and urgency.  Musculoskeletal: Negative for arthralgias.  Skin: Negative for color change.  Allergic/Immunologic: Negative for environmental allergies and food allergies.  Neurological: Negative for dizziness, light-headedness and headaches.  Psychiatric/Behavioral: Positive for confusion. Negative for dysphoric mood. The patient is not nervous/anxious.   All other systems reviewed and are negative.    Physical Exam  BP (!) 140/58 (BP Location: Left Arm, Cuff Size: Normal)   Pulse (!) 128   Ht 5' 5"  (1.651 m)   Wt 150 lb 6.4 oz (68.2 kg)   SpO2 95%   BMI 25.03 kg/m   Wt Readings from Last 5 Encounters:  03/24/18 150 lb 6.4 oz (68.2 kg)  03/03/18 156 lb  (70.8 kg)  02/09/18 162 lb 7.7 oz (73.7 kg)  01/27/18 168 lb 3.4 oz (76.3 kg)  01/23/18 163 lb 6.4 oz (74.1 kg)     Physical Exam  Constitutional: She is oriented to person, place, and time and well-developed, well-nourished, and in no distress. She has a sickly appearance. No distress.  HENT:  Head: Normocephalic and atraumatic.  Right Ear: Hearing, tympanic membrane, external ear and ear canal normal.  Left Ear: Hearing, tympanic membrane, external ear and ear canal normal.  Nose: Nose normal. Right sinus exhibits no maxillary sinus tenderness and no frontal sinus tenderness. Left sinus exhibits no maxillary sinus tenderness and no frontal sinus tenderness.  Mouth/Throat: Uvula is midline and oropharynx is clear and moist. No oropharyngeal exudate.  Eyes: Pupils are equal, round, and reactive to light.  Neck: Normal range of motion. Neck supple. No JVD present.  Cardiovascular: Regular rhythm and normal heart sounds. Tachycardia present.  Pulmonary/Chest: Effort normal. No accessory muscle usage. No respiratory distress. She has no decreased breath sounds. She has wheezes (Slight expiratory wheeze). She has no rhonchi.      Abdominal: Soft. Bowel sounds are normal. There is no abdominal tenderness.  Musculoskeletal: Normal range of motion.        General: Edema (2+ LE edema ) present.     Comments: In wheelchair   Lymphadenopathy:    She has no cervical adenopathy.  Neurological: She is alert and oriented to person, place, and time. Gait normal.  Skin: Skin is warm and dry. She is not diaphoretic. No erythema.  Psychiatric: Mood, memory, affect and judgment normal.  Nursing note and vitals reviewed.     Wells Criteria Modified Wells criteria: Clinical assessment for PE Clinical symptoms of DVT (leg swelling, pain with palpation) - 3 Other diagnoses less likely than pulmonary embolism - 3 Heart rate greater than 100 - 1.5 Immobilization (disease greater in 3 days) or surgery  in the previous 4 weeks - 1.5 Previous DVT/PE - 1.5 Hemoptysis - 1 Malignancy - 1  Probability Traditional clinical probability assessment (Wells criteria) High - greater than 6 Moderate - 2 to 6 Low less than 2  Simplify clinical probability assessment (modified Wells criteria) PE likely-greater than 4 PE unlikely little less than or equal to 4   Score: 3   Lab Results:  CBC    Component Value Date/Time   WBC 10.1 02/15/2018 1551   RBC 3.62 (L) 02/15/2018 1551   HGB 10.7 (L) 02/15/2018 1551   HGB 10.7 (L) 02/07/2018 1108   HGB 12.7 04/07/2017 1015   HCT 32.6 (L) 02/15/2018 1551   HCT 39.0 04/07/2017 1015   PLT 296.0 02/15/2018 1551   PLT 280 02/07/2018 1108   PLT 203 04/07/2017 1015   MCV 90.1 02/15/2018 1551   MCV 90.1 04/07/2017 1015   MCH 27.9 02/09/2018 0419   MCHC 32.9 02/15/2018 1551   RDW 15.0 02/15/2018 1551   RDW 13.5 04/07/2017 1015   LYMPHSABS 0.7 02/08/2018 1342   LYMPHSABS 2.1 04/07/2017 1015   MONOABS 0.4 02/08/2018 1342   MONOABS 0.7 04/07/2017 1015   EOSABS 0.0 02/08/2018 1342   EOSABS 1.1 (H) 04/07/2017 1015   EOSABS 0.4 08/06/2015 1036   BASOSABS 0.0 02/08/2018 1342   BASOSABS 0.0 04/07/2017 1015    BMET    Component Value Date/Time   NA 137 02/15/2018 1551   NA 144 01/23/2018 1406   NA 141 04/07/2017 1016   K 4.2 02/15/2018 1551   K 3.4 (L) 04/07/2017 1016   CL 98 02/15/2018 1551   CL 99 07/21/2012 1125   CO2 30 02/15/2018 1551   CO2 27 04/07/2017 1016   GLUCOSE 175 (H) 02/15/2018 1551   GLUCOSE 93 04/07/2017 1016   GLUCOSE 184 (H) 07/21/2012 1125   BUN 21 02/15/2018 1551   BUN 12 01/23/2018 1406   BUN 13.8 04/07/2017 1016   CREATININE 1.14 02/15/2018 1551   CREATININE 1.14 (H) 02/07/2018 1108   CREATININE 1.1 04/07/2017 1016   CALCIUM 9.1 02/15/2018 1551   CALCIUM 9.5 04/07/2017 1016   GFRNONAA 43 (L) 02/10/2018 0306   GFRNONAA 47 (L) 02/07/2018 1108   GFRAA 50 (L) 02/10/2018 0306   GFRAA 54 (L) 02/07/2018 1108     BNP    Component Value Date/Time   BNP 39.5 01/27/2018 1526    ProBNP    Component Value Date/Time   PROBNP 36.0 02/15/2018 1551      Assessment & Plan:   Pleasant 72 year old female patient completed an acute visit with her office today.  Unfortunately patient has not been on her current controller inhaler of Trelegy Ellipta for 3 weeks now patient with COPD exacerbation that is somewhat resolved since completing doxycycline which she was E prescribed.  Will cover patient with a Z-Pak as well as short course of prednisone as she has expiratory wheezes.  Will have patient restart Trelegy Ellipta.  Counseled patient as well as daughter on the importance of remaining on this medication.  Patient continue oxygen therapy as prescribed.  Patient with some confusion today offered arterial  blood gas to check to make sure patient is not retaining CO2 patient refused today.  Will follow on c-Met via blood work.  If elevated on c-Met will order ABG.  Patient and dtr agrees with this plan of care.  I believe patient's lung and back pain is probably musculoskeletal due to the fact that is reproducible and moves across the muscle as I palpate it.  Will get d-dimer just to rule out potential pulmonary embolism -as patient is tachycardic and has worsening shortness of breath even though I believe this is likely related to her COPD being poorly managed due to not being on trilogy Ellipta.  Patient to keep follow-up with [name] next week.   COPD with acute exacerbation (HCC) Azithromycin 236m tablet  >>>Take 2 tablets (5024mtotal) today, and then 1 tablet (25050mfor the next four days  >>>take with food  >>>can also take probiotic and / or yogurt while on antibiotic   Prednisone 33m25mblet  >>>Take 2 tablets (20 mg total) daily for the next 5 days, then resume 33mg71mly >>> Take with food in the morning  Chest Xray today  >>>[facility]   Labwork today  >>>[facility]   Restart  Trelegy Ellipta  >>> 1 puff daily in the morning >>>rinse mouth out after use  >>> This inhaler contains 3 medications that help manage her respiratory status, contact our office if you cannot afford this medication or unable to remain on this medication >>> Do this every day no matter what  Only use your albuterol as a rescue medication to be used if you can't catch your breath by resting or doing a relaxed purse lip breathing pattern.  - The less you use it, the better it will work when you need it. - Ok to use up to 2 puffs  every 4 hours if you must but call for immediate appointment if use goes up over your usual need - Don't leave home without it !!  (think of it like the spare tire for your car)   Albuterol nebulizers can be used every 6 hours as needed for shortness of breath and wheezing  Keep follow up with Dr ByrumLamonte Sakai week    Pleural effusion History of pleural effusion  We will get chest x-ray today  Respiratory failure with hypoxia (HCC) [place] prescribed  >>>maintain oxygen saturations greater than 88 percent  >>>if unable to maintain oxygen saturations please contact the office  >>>do not smoke with oxygen  >>>can use nasal saline gel or nasal saline rinses to moisturize nose if oxygen causes dryness  Keep follow up with Dr ByrumLamonte Sakai week    Dyspnea Chest Xray today  >>>[facility]   Labwork today  >>>[facility]   Based off the [facility] article on March 09, 2018 -Volume 381, #22, pages 2125-2134. >>>Patients with low clinical pretest probably based off of Wells score and a d-dimer less than 1000, does not need further imaging work-up for pulmonary embolism >>>Patients with moderate clinical pretest probability based off of Wells score and a d-dimer less than 500, does not need further imaging work-up for pulmonary embolism   Keep follow up with Dr ByrumLamonte Sakai week       BrianLauraine Rinne 03/24/2018   Addendum 03/29/18: Addended note to reflect patient's accurate oncology follow-up.  Patient is no longer being followed by Dr. MohamEarlie Serverdoes receive immunotherapy as patient is now on  hospice.

## 2018-03-24 NOTE — Assessment & Plan Note (Signed)
  Continue oxygen therapy as prescribed  >>>maintain oxygen saturations greater than 88 percent  >>>if unable to maintain oxygen saturations please contact the office  >>>do not smoke with oxygen  >>>can use nasal saline gel or nasal saline rinses to moisturize nose if oxygen causes dryness  Keep follow up with Dr Lamonte Sakai next week

## 2018-03-25 LAB — D-DIMER, QUANTITATIVE: D-Dimer, Quant: 3.39 mcg/mL FEU — ABNORMAL HIGH (ref <0.50)

## 2018-03-25 LAB — PRO B NATRIURETIC PEPTIDE: NT-PRO BNP: 336 pg/mL — AB (ref 0–301)

## 2018-03-27 ENCOUNTER — Telehealth: Payer: Self-pay | Admitting: Pulmonary Disease

## 2018-03-27 DIAGNOSIS — R0602 Shortness of breath: Secondary | ICD-10-CM

## 2018-03-27 DIAGNOSIS — R7989 Other specified abnormal findings of blood chemistry: Secondary | ICD-10-CM

## 2018-03-27 MED ORDER — DIPHENHYDRAMINE HCL 50 MG PO TABS: ORAL_TABLET | ORAL | 0 refills | Status: DC

## 2018-03-27 MED ORDER — PREDNISONE 10 MG PO TABS: ORAL_TABLET | ORAL | 0 refills | Status: DC

## 2018-03-27 NOTE — Progress Notes (Signed)
See telephone note from 03/27/18. Lab work discussed with Pt dtr. CT angio and lower extremity doppler ordered. Will not make changes to patients diuretic at this time. Pt to keep follow up with [name] this week.   Wyn Quaker FNP

## 2018-03-27 NOTE — Telephone Encounter (Signed)
03/27/18 1119  Please contact the patient's daughter E. Lovena Le via mobile to notify her that we have sent in the prescriptions for prednisone 50 mg and Benadryl 50 mg.  Patient to take 50 mg of prednisone 13 hours before CTA, then 7 hours before CTA, and then again 1 hour before CTA.  Take 50 mg each time.  Patient also take 50 mg of Benadryl 1 hour prior to CTA  Please call our office with any questions or concerns.  Wyn Quaker FNP

## 2018-03-27 NOTE — Telephone Encounter (Signed)
Please contact patients dtr E. Lovena Le via mobile phone to schedule.   Will route to PCCs.   Wyn Quaker FNP

## 2018-03-27 NOTE — Addendum Note (Signed)
Addended by: Lauraine Rinne on: 03/27/2018 11:21 AM   Modules accepted: Orders

## 2018-03-27 NOTE — Telephone Encounter (Signed)
03/27/18  0928  I was able to reach the patient's daughter E. Lovena Le and discussed lab results with daughter.  Patient was unable to complete chest x-ray on Friday as she was running out of oxygen and the x-ray facility did not have additional tanks.  Patient does not feel any better on trelegy Ellipta, prednisone and antibiotic.  Patient is now reporting leg pain.  Patient still short of breath which is more her baseline.  With the elevated d-dimer, leg pain, persistent shortness of breath, and patient still a full code wanting full care will proceed forward with a CT Angio to rule out a pulmonary embolism as well as a lower extremity Doppler to rule out DVTs.  I have discussed this with the patient's daughter she agrees with the plan of care moving forward.  We have also discussed the risk of additional imaging, contrast as well as these tests being repeated so soon as patient just recently had a CT Angio in August/2019 as well as a lower extremity Doppler in October/2019.  Patient's daughter would still like to proceed forward with care.  Pt to keep appt with [name] this week.   Wyn Quaker FNP

## 2018-03-27 NOTE — Telephone Encounter (Signed)
I called and spoke with daughter about the administration of medication and time frames. Patient's daughter read back instructions and stated she wrote them done. I also reminded her the appointment with [name] was on Thursday at 1145. Daughter thought it was on Wednesday.  Nothing further needed at this time.

## 2018-03-28 ENCOUNTER — Ambulatory Visit (HOSPITAL_COMMUNITY)
Admission: RE | Admit: 2018-03-28 | Discharge: 2018-03-28 | Disposition: A | Discharge status: Home / Self Care | Source: Ambulatory Visit | Attending: Cardiology | Admitting: Cardiology

## 2018-03-28 DIAGNOSIS — R7989 Other specified abnormal findings of blood chemistry: Secondary | ICD-10-CM

## 2018-03-28 DIAGNOSIS — C3490 Malignant neoplasm of unspecified part of unspecified bronchus or lung: Secondary | ICD-10-CM | POA: Diagnosis not present

## 2018-03-28 DIAGNOSIS — D6869 Other thrombophilia: Secondary | ICD-10-CM | POA: Diagnosis not present

## 2018-03-28 DIAGNOSIS — I509 Heart failure, unspecified: Secondary | ICD-10-CM | POA: Diagnosis not present

## 2018-03-28 DIAGNOSIS — R0602 Shortness of breath: Secondary | ICD-10-CM | POA: Diagnosis present

## 2018-03-28 DIAGNOSIS — J9610 Chronic respiratory failure, unspecified whether with hypoxia or hypercapnia: Secondary | ICD-10-CM | POA: Diagnosis not present

## 2018-03-28 DIAGNOSIS — J918 Pleural effusion in other conditions classified elsewhere: Secondary | ICD-10-CM | POA: Diagnosis not present

## 2018-03-28 DIAGNOSIS — J449 Chronic obstructive pulmonary disease, unspecified: Secondary | ICD-10-CM | POA: Diagnosis not present

## 2018-03-28 NOTE — Telephone Encounter (Signed)
Pt called back asking about the prednisone dose and I explained to her that she was supposed to take 50mg   At the interval times that [place] suggested. She kept saying she didn't want to take that much. Her daughter got on the phone and I advised her that if she doesn't take the prednisone before the test, she will not be able to have the CT angio done. The daughter relayed the message to her while I was on the phone and stated she wouldn't take ger to get the scan if she didn't take the medication. Nothing further is needed.

## 2018-03-28 NOTE — Progress Notes (Signed)
There is no DVT based on lower extremity dopplers.   Continue with CT Angio as ordered.   Wyn Quaker FNP

## 2018-03-29 ENCOUNTER — Ambulatory Visit (HOSPITAL_COMMUNITY)
Admission: RE | Admit: 2018-03-29 | Discharge: 2018-03-29 | Disposition: A | Discharge status: Home / Self Care | Source: Ambulatory Visit | Attending: Pulmonary Disease | Admitting: Pulmonary Disease

## 2018-03-29 ENCOUNTER — Telehealth: Payer: Self-pay | Admitting: Pulmonary Disease

## 2018-03-29 ENCOUNTER — Telehealth: Payer: Self-pay | Admitting: Emergency Medicine

## 2018-03-29 DIAGNOSIS — J449 Chronic obstructive pulmonary disease, unspecified: Secondary | ICD-10-CM | POA: Diagnosis not present

## 2018-03-29 DIAGNOSIS — C3490 Malignant neoplasm of unspecified part of unspecified bronchus or lung: Secondary | ICD-10-CM | POA: Diagnosis not present

## 2018-03-29 DIAGNOSIS — J9610 Chronic respiratory failure, unspecified whether with hypoxia or hypercapnia: Secondary | ICD-10-CM | POA: Diagnosis not present

## 2018-03-29 DIAGNOSIS — D6869 Other thrombophilia: Secondary | ICD-10-CM | POA: Diagnosis not present

## 2018-03-29 DIAGNOSIS — J918 Pleural effusion in other conditions classified elsewhere: Secondary | ICD-10-CM | POA: Diagnosis not present

## 2018-03-29 DIAGNOSIS — R7989 Other specified abnormal findings of blood chemistry: Secondary | ICD-10-CM

## 2018-03-29 DIAGNOSIS — R0602 Shortness of breath: Secondary | ICD-10-CM | POA: Diagnosis not present

## 2018-03-29 DIAGNOSIS — I509 Heart failure, unspecified: Secondary | ICD-10-CM | POA: Diagnosis not present

## 2018-03-29 MED ORDER — IOPAMIDOL (ISOVUE-370) INJECTION 76%
100.0000 mL | Freq: Once | INTRAVENOUS | Status: AC | PRN
  Administered 2018-03-29: 72 mL via INTRAVENOUS

## 2018-03-29 MED ORDER — SODIUM CHLORIDE (PF) 0.9 % IJ SOLN
INTRAMUSCULAR | Status: AC
  Filled 2018-03-29: qty 50

## 2018-03-29 MED ORDER — IOPAMIDOL (ISOVUE-370) INJECTION 76%
INTRAVENOUS | Status: AC
  Filled 2018-03-29: qty 100

## 2018-03-29 NOTE — Progress Notes (Signed)
See telephone note from 03/29/2018 from Wyn Quaker, [place].  Wyn Quaker FNP

## 2018-03-29 NOTE — Telephone Encounter (Signed)
03/29/18 1219  I was able to contact the patient's daughter to discuss the patient's CT results.  Explained there is no pulmonary embolism.  Explained to patient that we are seeing a recurrent right pleural effusion as well as worsened pulmonary metastasis on CT.  Patient's daughter reports that patient symptoms have slightly improved since patient has resumed Trelegy Ellipta.  Patient's daughter has also clarified that patient is now a DNR and they will bring the form so we can have a copy of that in our charting system here tomorrow to the office visit on 03/30/2018.  Patient's daughter and patient are both interested in the possibilities of a Pleurx catheter for palliative care management of these recurrent pleural effusions and patient's chronic shortness of breath.  Patient's daughter reports that patient is no longer following with oncology and is no longer doing immunotherapy.  I have discussed this case with [name] as he will be the provider seeing the patient tomorrow.  Wyn Quaker, FNP

## 2018-03-29 NOTE — Telephone Encounter (Signed)
I faxed the requested scans to the number provided. I called Renee and had to leave a message to let her know. Will leave in LM to make sure she received forms.

## 2018-03-30 ENCOUNTER — Telehealth (HOSPITAL_COMMUNITY): Payer: Self-pay

## 2018-03-30 ENCOUNTER — Encounter: Payer: Self-pay | Admitting: Emergency Medicine

## 2018-03-30 ENCOUNTER — Ambulatory Visit (INDEPENDENT_AMBULATORY_CARE_PROVIDER_SITE_OTHER): Admitting: Emergency Medicine

## 2018-03-30 ENCOUNTER — Other Ambulatory Visit: Payer: Self-pay | Admitting: Emergency Medicine

## 2018-03-30 VITALS — BP 136/60 | HR 116

## 2018-03-30 DIAGNOSIS — J9610 Chronic respiratory failure, unspecified whether with hypoxia or hypercapnia: Secondary | ICD-10-CM | POA: Diagnosis not present

## 2018-03-30 DIAGNOSIS — J918 Pleural effusion in other conditions classified elsewhere: Secondary | ICD-10-CM | POA: Diagnosis not present

## 2018-03-30 DIAGNOSIS — J90 Pleural effusion, not elsewhere classified: Secondary | ICD-10-CM | POA: Diagnosis not present

## 2018-03-30 DIAGNOSIS — C3490 Malignant neoplasm of unspecified part of unspecified bronchus or lung: Secondary | ICD-10-CM | POA: Diagnosis not present

## 2018-03-30 DIAGNOSIS — J449 Chronic obstructive pulmonary disease, unspecified: Secondary | ICD-10-CM | POA: Diagnosis not present

## 2018-03-30 DIAGNOSIS — C3411 Malignant neoplasm of upper lobe, right bronchus or lung: Secondary | ICD-10-CM | POA: Diagnosis not present

## 2018-03-30 DIAGNOSIS — I509 Heart failure, unspecified: Secondary | ICD-10-CM | POA: Diagnosis not present

## 2018-03-30 DIAGNOSIS — D6869 Other thrombophilia: Secondary | ICD-10-CM | POA: Diagnosis not present

## 2018-03-30 NOTE — Telephone Encounter (Signed)
Called to schedule pleurx catheter, no answer, no vm. AW

## 2018-03-30 NOTE — Progress Notes (Signed)
Subjective:    Patient ID: [name], female    DOB: 08-26-1945, 72 y.o.   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  She also has hypertension with diastolic dysfunction.  She was hospitalized in late August with progressive dyspnea, edema.  She was diuresed with improvement.  She was also treated empirically for possible exacerbation of COPD.  A right thoracentesis was done on 12/12/2017 and fluid appear to be most consistent with a transudate, culture negative, cytology negative. She reports that she is having some increased cough, nasal congestion, white sputum. She is on loratadine, omeprazole. She is on trelegy, uses albuterol nebs about 2x a day. Remains on lasix 40mg  daily, was started on lisinopril during the hospitalization. She finished her pred taper end of last week.   ROV 03/30/18 --[name] is 49, former smoker whom I have followed for her severe COPD, allergic rhinitis and chronic cough.  She also has a history of stage IV adenocarcinoma.  She is previously been on immunotherapy but it became quite difficult to tolerate and ultimately intolerable.  Her overall functional capacity has been limiting and has precluded her from any oncology therapy.  She is transition to home hospice.  She was seen here with pain including chest discomfort and dyspnea and a CT-PA was done on 12/18 that I reviewed.  This shows no pulmonary embolism, reaccumulation of a right large pleural effusion, combined compressive right lower lobe atelectasis and masslike right upper lobe consolidation consistent with progression of her malignancy.  Enlarging left midlung metastatic disease. She remains on Trelegy, prednisone 10mg  daily. Oxygen at 3-4 L/min. On loratadine, benadryl.  She is having more cough, more  mucous, dyspnea. Hospice is coming 2x a week. She uses ultram and benzo's more reliably than her hydrocodone. She is under-using her hydrocodone.   We discussed her terminal illness today. In particular we addressed transition to comfort based philosophy, her desire to avoid any further rx for her lung CA, the possibility of a palliative Pleurx tube. I also confirmed that she is DNR. I placed this order.       Objective:   Physical Exam Vitals:   03/30/18 1140  BP: 136/60  Pulse: (!) 116  SpO2: 94%   Gen: Pleasant, elderly woman, in no distress,  normal affect  ENT: No lesions, some nasal congestion  Neck: No JVD, no stridor  Lungs: No use of accessory muscles, coarse B especially at bases, soft exp wheze.   Cardiovascular: regular, 3/6 systolic M with an intact S2. Edema is improved  Musculoskeletal: No deformities, no cyanosis or clubbing.   Neuro: alert, non focal  Skin: Warm, no lesions or rashes   CBC Latest Ref Rng & Units 03/24/2018 02/15/2018 02/09/2018  WBC 4.0 - 10.5 K/uL 16.1(H) 10.1 9.7  Hemoglobin 12.0 - 15.0 g/dL 11.0(L) 10.7(L) 9.8(L)  Hematocrit 36.0 - 46.0 % 33.5(L) 32.6(L) 32.8(L)  Platelets 150.0 - 400.0 K/uL 368.0 296.0 266   BMP Latest Ref Rng & Units 03/24/2018 02/15/2018 02/10/2018  Glucose 70 - 99 mg/dL 138(H) 175(H) 108(H)  BUN 6 - 23 mg/dL 18 21 25(H)  Creatinine 0.40 - 1.20 mg/dL 1.02 1.14 1.22(H)  BUN/Creat Ratio 12 - 28 - - -  Sodium 135 - 145 mEq/L 134(L) 137 137  Potassium 3.5 - 5.1 mEq/L 3.4(L) 4.2 3.9  Chloride 96 -  112 mEq/L 89(L) 98 101  CO2 19 - 32 mEq/L 31 30 27   Calcium 8.4 - 10.5 mg/dL 8.1(L) 9.1 8.9         Assessment & Plan:  COPD (chronic obstructive pulmonary disease) ([place]) Please continue your Trelegy once daily.  Rinse and gargle after using. Please continue prednisone 10 mg daily. Please continue your albuterol 2 puffs or 1 nebulizer treatment up to every 4 hours if needed for shortness of breath.  Primary cancer of  right upper lobe of lung ([place]) Progressive disease with enlargement of her right effusion.  Regarding concentrate on symptom management and I think Pleurx catheter is reasonable.  I discussed it with her in detail today.  We will refer you to interventional radiology for Pleurx catheter placement to drain your right pleural effusion. We will communicate with hospice regarding symptom management of your chest discomfort.  I believe that you need to begin using your hydrocodone more regularly. We will communicate with hospice regarding drainage of your Pleurx tube intermittently once it is placed. Dr Lamonte Sakai confirmed your DNR orders in the chart today.  Follow with Dr Lamonte Sakai in 1 month  Baltazar Apo, MD, PhD 03/30/2018, 12:25 PM [facility] [phone] or if no answer [phone]

## 2018-03-30 NOTE — Telephone Encounter (Signed)
Renee/Hospice returning call. She needs to know what type of drain is going to be put in.  She did receive forms.  CB [phone].

## 2018-03-30 NOTE — Telephone Encounter (Signed)
lmtcb x2 for [name] with hospice to verify that scan were received.

## 2018-03-30 NOTE — Assessment & Plan Note (Addendum)
Progressive disease with enlargement of her right effusion.  Regarding concentrate on symptom management and I think Pleurx catheter is reasonable.  I discussed it with her in detail today.  Also discussed CODE STATUS with her and her son today.  I have confirmed that DNR orders should be placed.  She has DNR paperwork at home. 45 minutes spent discussing [place], plan of care, code status.   We will refer you to interventional radiology for Pleurx catheter placement to drain your right pleural effusion. We will communicate with hospice regarding symptom management of your chest discomfort.  I believe that you need to begin using your hydrocodone more regularly. We will communicate with hospice regarding drainage of your Pleurx tube intermittently once it is placed. Dr Lamonte Sakai confirmed your DNR orders in the chart today.  Follow with Dr Lamonte Sakai in 1 month

## 2018-03-30 NOTE — Telephone Encounter (Signed)
Called and spoke to [place] with hospice. Renee was questioning what type of drain pt would have placed.  I have made Renee aware that pt has been referred to IR for pleurx catheter.  Renee voiced her understanding and had no additional questions. Nothing further is needed.    Instructions   Please continue your Trelegy once daily.  Rinse and gargle after using. Please continue prednisone 10 mg daily. Please continue your albuterol 2 puffs or 1 nebulizer treatment up to every 4 hours if needed for shortness of breath. We will refer you to interventional radiology for Pleurx catheter placement to drain your right pleural effusion. We will communicate with hospice regarding symptom management of your chest discomfort.  I believe that you need to begin using your hydrocodone more regularly. We will communicate with hospice regarding drainage of your Pleurx tube intermittently once it is placed. Dr Lamonte Sakai confirmed your DNR orders in the chart today.  Follow with Dr Lamonte Sakai in 1 month

## 2018-03-30 NOTE — Assessment & Plan Note (Signed)
Please continue your Trelegy once daily.  Rinse and gargle after using. Please continue prednisone 10 mg daily. Please continue your albuterol 2 puffs or 1 nebulizer treatment up to every 4 hours if needed for shortness of breath.

## 2018-03-30 NOTE — Patient Instructions (Signed)
Please continue your Trelegy once daily.  Rinse and gargle after using. Please continue prednisone 10 mg daily. Please continue your albuterol 2 puffs or 1 nebulizer treatment up to every 4 hours if needed for shortness of breath. We will refer you to interventional radiology for Pleurx catheter placement to drain your right pleural effusion. We will communicate with hospice regarding symptom management of your chest discomfort.  I believe that you need to begin using your hydrocodone more regularly. We will communicate with hospice regarding drainage of your Pleurx tube intermittently once it is placed. Dr Lamonte Sakai confirmed your DNR orders in the chart today.  Follow with Dr Lamonte Sakai in 1 month

## 2018-03-31 ENCOUNTER — Emergency Department (HOSPITAL_COMMUNITY)
Admission: EM | Admit: 2018-03-31 | Discharge: 2018-03-31 | Disposition: A | Discharge status: Home / Self Care | Attending: Emergency Medicine | Admitting: Emergency Medicine

## 2018-03-31 ENCOUNTER — Encounter (HOSPITAL_COMMUNITY): Payer: Self-pay | Admitting: Internal Medicine

## 2018-03-31 ENCOUNTER — Telehealth: Payer: Self-pay | Admitting: Emergency Medicine

## 2018-03-31 ENCOUNTER — Emergency Department (HOSPITAL_COMMUNITY)

## 2018-03-31 DIAGNOSIS — I471 Supraventricular tachycardia: Secondary | ICD-10-CM | POA: Insufficient documentation

## 2018-03-31 DIAGNOSIS — I509 Heart failure, unspecified: Secondary | ICD-10-CM | POA: Diagnosis not present

## 2018-03-31 DIAGNOSIS — I110 Hypertensive heart disease with heart failure: Secondary | ICD-10-CM | POA: Diagnosis not present

## 2018-03-31 DIAGNOSIS — I2510 Atherosclerotic heart disease of native coronary artery without angina pectoris: Secondary | ICD-10-CM | POA: Insufficient documentation

## 2018-03-31 DIAGNOSIS — F419 Anxiety disorder, unspecified: Secondary | ICD-10-CM | POA: Diagnosis not present

## 2018-03-31 DIAGNOSIS — Z7982 Long term (current) use of aspirin: Secondary | ICD-10-CM | POA: Insufficient documentation

## 2018-03-31 DIAGNOSIS — Z79899 Other long term (current) drug therapy: Secondary | ICD-10-CM | POA: Diagnosis not present

## 2018-03-31 DIAGNOSIS — Z96642 Presence of left artificial hip joint: Secondary | ICD-10-CM | POA: Diagnosis not present

## 2018-03-31 DIAGNOSIS — J449 Chronic obstructive pulmonary disease, unspecified: Secondary | ICD-10-CM | POA: Insufficient documentation

## 2018-03-31 DIAGNOSIS — Z85118 Personal history of other malignant neoplasm of bronchus and lung: Secondary | ICD-10-CM | POA: Diagnosis not present

## 2018-03-31 DIAGNOSIS — E119 Type 2 diabetes mellitus without complications: Secondary | ICD-10-CM | POA: Diagnosis not present

## 2018-03-31 DIAGNOSIS — Z87891 Personal history of nicotine dependence: Secondary | ICD-10-CM | POA: Diagnosis not present

## 2018-03-31 DIAGNOSIS — J9610 Chronic respiratory failure, unspecified whether with hypoxia or hypercapnia: Secondary | ICD-10-CM | POA: Diagnosis not present

## 2018-03-31 DIAGNOSIS — Z7984 Long term (current) use of oral hypoglycemic drugs: Secondary | ICD-10-CM | POA: Insufficient documentation

## 2018-03-31 DIAGNOSIS — R0602 Shortness of breath: Secondary | ICD-10-CM | POA: Diagnosis not present

## 2018-03-31 DIAGNOSIS — R0902 Hypoxemia: Secondary | ICD-10-CM | POA: Diagnosis not present

## 2018-03-31 DIAGNOSIS — I5032 Chronic diastolic (congestive) heart failure: Secondary | ICD-10-CM | POA: Diagnosis not present

## 2018-03-31 DIAGNOSIS — Z515 Encounter for palliative care: Secondary | ICD-10-CM

## 2018-03-31 DIAGNOSIS — D6869 Other thrombophilia: Secondary | ICD-10-CM | POA: Diagnosis not present

## 2018-03-31 DIAGNOSIS — J918 Pleural effusion in other conditions classified elsewhere: Secondary | ICD-10-CM | POA: Diagnosis not present

## 2018-03-31 DIAGNOSIS — C3490 Malignant neoplasm of unspecified part of unspecified bronchus or lung: Secondary | ICD-10-CM | POA: Diagnosis not present

## 2018-03-31 DIAGNOSIS — Z743 Need for continuous supervision: Secondary | ICD-10-CM | POA: Diagnosis not present

## 2018-03-31 DIAGNOSIS — R279 Unspecified lack of coordination: Secondary | ICD-10-CM | POA: Diagnosis not present

## 2018-03-31 LAB — BASIC METABOLIC PANEL
Anion gap: 17 — ABNORMAL HIGH (ref 5–15)
BUN: 17 mg/dL (ref 8–23)
CO2: 29 mmol/L (ref 22–32)
Calcium: 8.3 mg/dL — ABNORMAL LOW (ref 8.9–10.3)
Chloride: 93 mmol/L — ABNORMAL LOW (ref 98–111)
Creatinine, Ser: 1.17 mg/dL — ABNORMAL HIGH (ref 0.44–1.00)
GFR calc Af Amer: 53 mL/min — ABNORMAL LOW (ref >60)
GFR calc non Af Amer: 46 mL/min — ABNORMAL LOW (ref >60)
Glucose, Bld: 201 mg/dL — ABNORMAL HIGH (ref 70–99)
Potassium: 3.2 mmol/L — ABNORMAL LOW (ref 3.5–5.1)
Sodium: 139 mmol/L (ref 135–145)

## 2018-03-31 LAB — CBC
HCT: 34.8 % — ABNORMAL LOW (ref 36.0–46.0)
Hemoglobin: 10.9 g/dL — ABNORMAL LOW (ref 12.0–15.0)
MCH: 29 pg (ref 26.0–34.0)
MCHC: 31.3 g/dL (ref 30.0–36.0)
MCV: 92.6 fL (ref 80.0–100.0)
Platelets: 426 10*3/uL — ABNORMAL HIGH (ref 150–400)
RBC: 3.76 MIL/uL — ABNORMAL LOW (ref 3.87–5.11)
RDW: 13.6 % (ref 11.5–15.5)
WBC: 20.3 10*3/uL — ABNORMAL HIGH (ref 4.0–10.5)
nRBC: 0 % (ref 0.0–0.2)

## 2018-03-31 MED ORDER — SODIUM CHLORIDE 0.9 % IV BOLUS
500.0000 mL | Freq: Once | INTRAVENOUS | Status: AC
  Administered 2018-03-31: 500 mL via INTRAVENOUS

## 2018-03-31 MED ORDER — LORAZEPAM 1 MG PO TABS
0.5000 mg | ORAL_TABLET | Freq: Once | ORAL | Status: AC
  Administered 2018-03-31: 0.5 mg via ORAL
  Filled 2018-03-31: qty 1

## 2018-03-31 MED ORDER — DILTIAZEM HCL 25 MG/5ML IV SOLN
20.0000 mg | Freq: Once | INTRAVENOUS | Status: AC
  Administered 2018-03-31: 20 mg via INTRAVENOUS
  Filled 2018-03-31: qty 5

## 2018-03-31 MED ORDER — DILTIAZEM HCL 120 MG PO TABS: 120.0000 mg | ORAL_TABLET | Freq: Four times a day (QID) | ORAL | 0 refills | Status: AC | PRN

## 2018-03-31 MED ORDER — MORPHINE SULFATE (PF) 2 MG/ML IV SOLN
2.0000 mg | Freq: Once | INTRAVENOUS | Status: AC
  Administered 2018-03-31: 2 mg via INTRAVENOUS
  Filled 2018-03-31: qty 1

## 2018-03-31 MED ORDER — LORAZEPAM 2 MG/ML IJ SOLN: 1.0000 mg | Freq: Once | INTRAMUSCULAR | Status: DC

## 2018-03-31 MED ORDER — DILTIAZEM HCL ER COATED BEADS 180 MG PO CP24
180.0000 mg | ORAL_CAPSULE | Freq: Once | ORAL | Status: AC
  Administered 2018-03-31: 180 mg via ORAL
  Filled 2018-03-31 (×2): qty 1

## 2018-03-31 NOTE — Telephone Encounter (Signed)
Thank you :)

## 2018-03-31 NOTE — ED Notes (Signed)
Patient's HR successfully converted from 170 to 92 NSR.

## 2018-03-31 NOTE — ED Triage Notes (Signed)
Pt BIB [facility] for shortness of breath. Hx COPD, right lower lung tumor, CHF. Pt in SVT for EMS. Given 6mg  adenosine first without successful conversion. EMS gave another 12mg  adenosine without successful conversion. After 12mg  administration of adenosine, pt had 30 second period of unresponsiveness where she was in asystole into bradycardia. Pt started posturing for EMS. Pt now A/Ox4.

## 2018-03-31 NOTE — ED Provider Notes (Signed)
pts HR increased to 170 again when EMS came to pick her up. She will be given a single dose of IV cardizem at this time for rate control. She is not very symptomatic at this time.     Jola Schmidt, MD 03/31/18 [phone]

## 2018-03-31 NOTE — ED Provider Notes (Signed)
[facility] EMERGENCY DEPARTMENT Provider Note   CSN: [id number] Arrival date & time: 03/31/18  1432     History   Chief Complaint Chief Complaint  Patient presents with  . Shortness of Breath    svt    HPI [name] is a 72 y.o. female.  HPI Patient is a 72 year old female with a history of metastatic lung cancer currently with advanced disease and involved in comfort measures with hospice and palliative care services of [place].  She developed acute onset shortness of breath last night that persisted through the day and she was sent to the emergency department by her hospice team.  She presented via EMS.  EMS reports the patient was at an SVT on their arrival and they gave HER-2 dose of adenosine the second of which led to 30 seconds of asystole and unresponsiveness.  She was able to come out of this after approximately 30 seconds.  She is now alert and oriented x4.  Her heart rate is 160.  She feels short of breath.  She has a known pleural effusion.  She is requesting something to drink and something for her anxiety and something to help her breathing.  Past Medical History:  Diagnosis Date  . Adenocarcinoma, lung ([place])    upper left lobe  . Anemia in neoplastic disease   . Anxiety   . Arthritis    "all over my body"  . Asthma   . Asymptomatic varicose veins   . Atherosclerosis of native arteries of the extremities, unspecified   . Atrial fibrillation ([place])   . Bronchial pneumonia    history  . 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 Chemotherapy adverse reaction 09/15/11   "makes me light headed and pass out; this is 3rd blood transfusion since going thru it"  . CHF (congestive heart failure) ([place])   . Chronic airway obstruction, not elsewhere classified   . Chronic fatigue 08/19/2015  . Chronic sinusitis   . Complication of anesthesia    slow to wake up  . COPD with emphysema ([place])   . Coronary artery disease    Dr. Burt Knack had stress  test done  . DDD (degenerative disc disease), lumbar   . Depressive disorder, not elsewhere classified   . Diseases of lips   . Disorder of bone and cartilage, unspecified   . DVT (deep venous thrombosis) ([place])   . Fibromyalgia   . Functional urinary incontinence   . GERD (gastroesophageal reflux disease)   . Hard of hearing, right   . Herpes zoster without mention of complication   . Hip pain, chronic 09/17/2014  . History of blood clots    "2 in my aorta"  . History of blood transfusion   . History of bronchitis   . History of echocardiogram    Echo 11/18: mild LVH, EF 60-65, no RWMA, Gr 1 DD, Ao Sclerosis, mild AI, RVH with low nl RSVF, trivial eff  . Hx of radiation therapy 04/28/11 -06/08/11   right upper lobe-lung  . Hyperlipidemia   . Hypertension   . Kidney infection    "related to chemo"  . Lumbago   . Malignant neoplasm of bronchus and lung, unspecified site   . Mental disorder   . Muscle weakness (generalized)   . Non-small cell lung cancer ([place]) 03/31/2011   Adenocarcinoma Rt upper lobe mass  . Osteoarthrosis, unspecified whether generalized or localized, unspecified site   . Osteoporosis   . Other  abnormal blood chemistry   . Other and unspecified hyperlipidemia   . PAD (peripheral artery disease) ([place])   . Pain in thoracic spine   . 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 Primary cancer of right upper lobe of lung ([place]) 02/08/2011   Biopsy Rt upper lobe mass 03/31/11>>adenocarcinoma DIAGNOSIS: Stage IIB/IIIA non-small cell lung cancer consistent with adenocarcinoma diagnosed in December 2012.   PRIOR THERAPY:  1) Status post radiation therapy to the right upper lobe lung mass completed on 06/04/2011 under the care of Dr. Pablo Ledger.  2) Systemic chemotherapy with carboplatin for AUC of 5 and Alimta 500 mg/M2 giving every 3   . Reflux   . Reflux esophagitis   . Respiratory failure with hypoxia 01/02/2010  . Restless legs syndrome (RLS)   . Scoliosis   . Screening  for thyroid disorder   . Shortness of breath    "anytime really"  . Sinus headache    "all the time"  . Spasm of muscle   . Syncope and collapse   . Type II or unspecified type diabetes mellitus with neurological manifestations, uncontrolled(250.62)   . Type II or unspecified type diabetes mellitus without mention of complication, not stated as uncontrolled   . Type II or unspecified type diabetes mellitus without mention of complication, not stated as uncontrolled   . Unspecified constipation   . Unspecified essential hypertension   . Unspecified hearing loss   . Unspecified hereditary and idiopathic peripheral neuropathy   . Unspecified vitamin D deficiency     Patient Active Problem List   Diagnosis Date Noted  . Palliative care by specialist   . Anxiety state   . S/P thoracentesis   . Lung cancer ([address]) 02/08/2018  . HCAP (healthcare-associated pneumonia)   . Hypomagnesemia 01/28/2018  . Fever of unknown origin 01/10/2018  . Pleural effusion 12/09/2017  . Chest pain 12/09/2017  . 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 Nasal sore 10/25/2017  . COPD with acute exacerbation ([place]) 10/25/2017  . Goals of care, counseling/discussion 09/01/2017  . Encounter for antineoplastic immunotherapy 09/01/2017  . Community acquired pneumonia 08/10/2017  . Urinary frequency 07/04/2017  . Chronic pain 07/04/2017  . Port-A-Cath in place 05/05/2017  . Memory changes 02/14/2017  . Constipation 02/14/2017  . CAD (coronary artery disease) 11/30/2016  . Thrush 07/21/2016  . Benign paroxysmal positional vertigo 01/20/2016  . Palliative care encounter 08/19/2015  . Chronic fatigue 08/19/2015  . Anxiety and depression 07/29/2015  . Headache, tension type, chronic 09/17/2014  . Hip pain, chronic 09/17/2014  . History of fall 05/21/2014  . Insomnia 01/09/2014  . Gait abnormality 08/16/2013  . DM type 2 with diabetic peripheral neuropathy ([place]) 08/01/2013  . Oropharyngeal dysphagia  08/01/2013  . PUD (peptic ulcer disease) 08/01/2013  . Myalgia and myositis 08/01/2013  . Esophageal reflux 06/27/2013  . Atherosclerosis of native arteries of the extremities with intermittent claudication 10/26/2012  . 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 Restless legs syndrome (RLS)   . GERD (gastroesophageal reflux disease)   . Heart murmur   . History of blood clots   . Syncope and collapse   . History of blood transfusion   . Fibromyalgia   . DDD (degenerative disc disease), lumbar   . Scoliosis   . Mental disorder   . Chronic diastolic heart failure ([place])   . Hx of radiation therapy   . Back pain 11/26/2011  . Chronic cough 11/26/2011  . Dyspnea 09/15/2011  . Weakness generalized 09/15/2011  . Anemia  09/15/2011  . Hypokalemia 08/13/2011  . Primary cancer of right upper lobe of lung ([place]) 02/08/2011  . Respiratory failure with hypoxia ([place]) 01/02/2010  . PAD (peripheral artery disease) ([place]) 08/06/2008  . Dyslipidemia 10/28/2006  . Essential hypertension 10/28/2006  . Sinusitis, chronic 10/28/2006  . COPD (chronic obstructive pulmonary disease) ([place]) 10/28/2006    Past Surgical History:  Procedure Laterality Date  . BRONCHOSCOPY  02/2011  . CARDIAC CATHETERIZATION    . CATARACT EXTRACTION  03/12/2013   Left Eye   . DILATION AND CURETTAGE OF UTERUS  1980's  . EYE SURGERY Left 2014   Cataract  . FETAL BLOOD TRANSFUSION  09/16/11  . JOINT REPLACEMENT  04/13/10   Left Hip  . LUNG LOBECTOMY  1986   left  . SEPTOPLASTY    . TOTAL HIP ARTHROPLASTY  01/2010   left  . TUBAL LIGATION  1980's     OB History   No obstetric history on file.      Home Medications    Prior to Admission medications   Medication Sig Start Date End Date Taking? Authorizing Provider  ACCU-CHEK AVIVA PLUS test strip USE AS DIRECTED TO CHECK BLOOD SUGAR THREE TIMES DAILY 11/14/17   [name], MD  albuterol (PROVENTIL HFA;VENTOLIN HFA) 108 (90 Base) MCG/ACT inhaler Inhale 2 puffs into  the lungs every 6 (six) hours as needed for wheezing or shortness of breath. 06/01/17   Collene Gobble, MD  albuterol (PROVENTIL) (2.5 MG/3ML) 0.083% nebulizer solution USE 1 VIAL VIA NEBULIZER EVERY 6 HOURS AS NEEDED FOR WHEEZING OR SHORTNESS OF BREATH Patient taking differently: Take 2.5 mg by nebulization every 6 (six) hours as needed for wheezing or shortness of breath.  08/03/17   Collene Gobble, MD  ALPRAZolam Duanne Moron) 0.5 MG tablet TAKE 1 TABLET BY MOUTH 3 TIMES A DAY AS NEEDED FOR ANXIETY 01/30/18   Eugenie Filler, MD  aspirin 81 MG tablet Take 81 mg by mouth daily.    [provider]  Azelastine HCl 137 MCG/SPRAY SOLN Place 1 puff into the nose daily. 03/03/18   Martyn Ehrich, NP  azithromycin (ZITHROMAX) 250 MG tablet 554m (two tablets) today, then 2529m(1 tablet) for the next 4 days 03/24/18   MaLauraine RinneNP  benzonatate (TESSALON PERLES) 100 MG capsule Take 1 capsule (100 mg total) by mouth every 6 (six) hours as needed for cough. 02/15/18 02/15/19  WaMartyn EhrichNP  Blood Glucose Monitoring Suppl (ACCU-CHEK AVIVA PLUS) w/Device KIT Use as directed to check blood sugar Dx: E11.9, E11.49 04/21/16   GrEstill DoomsMD  Calcium Carbonate-Vit D-Min (CALTRATE PLUS PO) Take 1 tablet by mouth every other day.     [provider]  diltiazem (CARDIZEM) 120 MG tablet Take 1 tablet (120 mg total) by mouth every 6 (six) hours as needed (rapid heart rate >140). 03/31/18   CaJola SchmidtMD  diphenhydrAMINE (BENADRYL) 50 MG tablet Take one 5094mablet 1 hour before CTA 03/27/18   MacLauraine RinneP  Fluticasone-Umeclidin-Vilant (TRELEGY ELLIPTA) 100-62.5-25 MCG/INH AEPB Inhale 1 puff into the lungs daily. 03/24/18   MacLauraine RinneP  Fluticasone-Umeclidin-Vilant (TRELEGY ELLIPTA) 100-62.5-25 MCG/INH AEPB Inhale 1 puff into the lungs daily. 03/24/18   MacLauraine RinneP  furosemide (LASIX) 40 MG tablet Take 1 tablet (40 mg total) by mouth daily. 01/23/18 02/22/18  WeaRichardson Dopp PA-C  guaiFENesin (MUCINEX) 600 MG 12 hr tablet Take 1 tablet (600 mg total)  by mouth 2 (two) times daily. 03/22/18   Collene Gobble, MD  guaiFENesin-dextromethorphan (ROBITUSSIN DM) 100-10 MG/5ML syrup Take 5 mLs by mouth every 4 (four) hours as needed for cough. 02/11/18   Thurnell Lose, MD  HYDROcodone-acetaminophen (NORCO) 10-325 MG tablet Take 1 tablet by mouth 2 (two) times daily as needed. 03/03/18   Martyn Ehrich, NP  HYDROcodone-homatropine (HYDROMET) 5-1.5 MG/5ML syrup Take 5-10cc every 6 hours if needed for cough 03/13/18   Byrum, Rose Fillers, MD  loratadine (CLARITIN) 10 MG tablet Take 1 tablet (10 mg total) by mouth daily. 01/31/18   Eugenie Filler, MD  LORazepam (ATIVAN) 0.5 MG tablet Take 0.5 mg by mouth every 4 (four) hours as needed for anxiety.    [provider]  metFORMIN (GLUCOPHAGE) 500 MG tablet Take 1 tablet (500 mg total) by mouth 2 (two) times daily. 12/15/17   Hongalgi, Lenis Dickinson, MD  metoprolol tartrate (LOPRESSOR) 25 MG tablet Take 1 tablet (25 mg total) by mouth 2 (two) times daily. 01/23/18   Richardson Dopp T, PA-C  nitroGLYCERIN (NITROSTAT) 0.4 MG SL tablet Place 1 tablet (0.4 mg total) under the tongue every 5 (five) minutes as needed. 01/23/18   Richardson Dopp T, PA-C  ondansetron (ZOFRAN) 4 MG tablet Take 1 tablet (4 mg total) by mouth every 8 (eight) hours as needed for nausea or vomiting. 01/16/18   Martyn Ehrich, NP  OXYGEN Inhale 3-4 L into the lungs.     [provider]  pantoprazole (PROTONIX) 40 MG tablet Take 1 tablet (40 mg total) by mouth 2 (two) times daily before a meal. 01/30/18   Eugenie Filler, MD  potassium chloride (K-DUR,KLOR-CON) 10 MEQ tablet Take 1 tablet (10 mEq total) by mouth daily. 01/23/18   Richardson Dopp T, PA-C  predniSONE (DELTASONE) 10 MG tablet Take 1 tablet (10 mg total) by mouth daily with breakfast. 01/13/18   Martyn Ehrich, NP  predniSONE (DELTASONE) 10 MG tablet 4 tabs for 2 days, then 3  tabs for 2 days, 2 tabs for 2 days, then 1 tab for 2 days, then stop 03/24/18   Lauraine Rinne, NP  predniSONE (DELTASONE) 10 MG tablet Take 5 tablets (61m) 13 hours before CTA, then 5103m7 hours before CTA, and 5035mgain 1 hour before CTA 03/27/18   MacLauraine RinneP  Respiratory Therapy Supplies (FLUTTER) DEVI Use a directed 11/08/17   WalMartyn EhrichP  senna (SENOKOT) 8.6 MG tablet Take 1 tablet by mouth 2 (two) times daily as needed for constipation.     [provider]  simethicone (MYLICON) 80 MG chewable tablet Chew 1 tablet (80 mg total) by mouth 4 (four) times daily as needed for flatulence. 01/30/18   ThoEugenie FillerD  sodium chloride (OCEAN) 0.65 % SOLN nasal spray Place 1 spray into both nostrils as needed for congestion. 01/30/18   ThoEugenie FillerD  traMADol (ULTRAM) 50 MG tablet TAKE 2 TABLETS (100MG TOTAL) BY MOUTH EVERY 6 HOURS AS NEEDED FOR SEVERE PAIN Patient taking differently: Take 100 mg by mouth every 6 (six) hours as needed for moderate pain.  07/25/17   JohBiagio BorgD  TRELEGY ELLIPTA 100-62.5-25 MCG/INH AEPB INHALE 1 PUFF BY MOUTH ONCE DAILY 09/26/17   ByrCollene GobbleD  valsartan (DIOVAN) 40 MG tablet Take 1 tablet (40 mg total) by mouth daily. 01/23/18   WeaRichardson Dopp PA-C  calcium carbonate 200 MG capsule Take 250  mg by mouth daily. Does not take everyday  01/23/18  [provider]    Family History Family History  Problem Relation Age of Onset  . Bone cancer Father   . Cancer Father        Bone  . Diabetes Mother   . Deep vein thrombosis Mother   . Heart disease Mother        Heart Disease before age 89-  PVD  . Hyperlipidemia Mother   . Hypertension Mother   . Varicose Veins Mother   . Arthritis Daughter     Social History Social History   Tobacco Use  . Smoking status: Former Smoker    Packs/day: 1.00    Years: 40.00    Pack years: 40.00    Types: Cigarettes    Last attempt to quit: 02/10/1998    Years  since quitting: 20.1  . Smokeless tobacco: Never Used  Substance Use Topics  . Alcohol use: No  . Drug use: No     Allergies   Iohexol; Other; Anoro ellipta [umeclidinium-vilanterol]; Codeine; and Protonix [pantoprazole sodium]   Review of Systems Review of Systems  Unable to perform ROS: Severe respiratory distress     Physical Exam Updated Vital Signs BP 137/75 (BP Location: Right Arm)   Pulse (!) 168   Temp 98.2 F (36.8 C) (Oral)   Resp (!) 25   SpO2 98%   Physical Exam Vitals signs and nursing note reviewed.  Constitutional:      General: She is in acute distress.     Appearance: She is ill-appearing.  HENT:     Head: Normocephalic and atraumatic.  Neck:     Musculoskeletal: Normal range of motion.  Cardiovascular:     Rate and Rhythm: Regular rhythm. Tachycardia present.     Heart sounds: Normal heart sounds.  Pulmonary:     Effort: Tachypnea and accessory muscle usage present.     Breath sounds: No stridor.  Abdominal:     General: There is no distension.     Palpations: Abdomen is soft.     Tenderness: There is no abdominal tenderness.  Musculoskeletal: Normal range of motion.     Right lower leg: No edema.     Left lower leg: No edema.  Skin:    General: Skin is warm and dry.  Neurological:     Mental Status: She is alert and oriented to person, place, and time.  Psychiatric:        Judgment: Judgment normal.     Comments: Anxious appearing      ED Treatments / Results  Labs (all labs ordered are listed, but only abnormal results are displayed) Labs Reviewed  CBC - Abnormal; Notable for the following components:      Result Value   WBC 20.3 (*)    RBC 3.76 (*)    Hemoglobin 10.9 (*)    HCT 34.8 (*)    Platelets 426 (*)    All other components within normal limits  BASIC METABOLIC PANEL - Abnormal; Notable for the following components:   Potassium 3.2 (*)    Chloride 93 (*)    Glucose, Bld 201 (*)    Creatinine, Ser 1.17 (*)     Calcium 8.3 (*)    GFR calc non Af Amer 46 (*)    GFR calc Af Amer 53 (*)    Anion gap 17 (*)    All other components within normal limits    EKG None  Radiology  Dg Chest Portable 1 View  Result Date: 03/31/2018 CLINICAL DATA:  Short of breath.  History of lung cancer EXAM: PORTABLE CHEST 1 VIEW COMPARISON:  CT chest 03/29/2018, chest two-view 02/15/2018 FINDINGS: Compared with prior chest x-ray 02/15/2018, right upper lobe masslike density has progressed in size. Possible surrounding pneumonia. Progressive right lower lobe airspace disease. Progression of right pleural effusion. Findings appear similar to the recent chest CT. Negative for heart failure. Postsurgical lobectomy changes on the left. Left lung remains clear. IMPRESSION: Right upper lobe mass lesion with surrounding airspace disease and right pleural effusion. Possible pneumonia or malignant effusion. Electronically Signed   By: Franchot Gallo M.D.   On: 03/31/2018 15:32    Procedures .Critical Care Performed by: Jola Schmidt, MD Authorized by: Jola Schmidt, MD   Critical care provider statement:    Critical care time (minutes):  33   Critical care was time spent personally by me on the following activities:  Discussions with consultants, evaluation of patient's response to treatment, examination of patient, ordering and performing treatments and interventions, ordering and review of laboratory studies, ordering and review of radiographic studies, pulse oximetry, re-evaluation of patient's condition, obtaining history from patient or surrogate and review of old charts   Medications Ordered in ED Medications  LORazepam (ATIVAN) injection 1 mg (has no administration in time range)  morphine 2 MG/ML injection 2 mg (2 mg Intravenous Given 03/31/18 1528)  sodium chloride 0.9 % bolus 500 mL (500 mLs Intravenous New Bag/Given 03/31/18 1529)  diltiazem (CARDIZEM) injection 20 mg (20 mg Intravenous Given 03/31/18 1528)      Initial Impression / Assessment and Plan / ED Course  I have reviewed the triage vital signs and the nursing notes.  Pertinent labs & imaging results that were available during my care of the patient were reviewed by me and considered in my medical decision making (see chart for details).     Patient is severe respiratory distress on arrival.  Advanced cancer.  DNR.  Hospice services being performed at home.  Patient given morphine and ordered Ativan for comfort.  Given her very abnormal response with adenosine I did not think this was appropriate to perform again.  She was given a single dose of Cardizem to help with heart rate and arrhythmia which hopefully would provide her additional comfort and improvement in her breathing  Patient improved with IV Cardizem to a sinus rhythm with a heart rate of 90.  She feels much better after morphine and chemical cardioversion.  Long discussion was had with the patient and the hospice team who is now at the bedside.  The patient has read been receiving steroids at home and bronchodilators.  The bronchodilators itself may have increased her heart rate and placed her in SVT.  Her metoprolol will be increased from 25 mg twice daily to 50 mg twice daily.  I have also ordered as needed Cardizem orally to be given at home for heart rates greater than 140 so that the patient may have an option if she develops SVT at home to try oral chemical cardioversion at home via assistance with the hospice team.  Patient would like to go back home at this time.  Hospice is aware of the new plan and medication changes that hopefully can provide additional comfort for this severely ill patient with advanced cancer.  Final Clinical Impressions(s) / ED Diagnoses   Final diagnoses:  SVT (supraventricular tachycardia) [facility])  Hospice care patient    ED Discharge Orders  Ordered    diltiazem (CARDIZEM) 120 MG tablet  Every 6 hours PRN     03/31/18 [id number]            Jola Schmidt, MD 03/31/18 1609

## 2018-03-31 NOTE — Discharge Instructions (Addendum)
Increase the METOPROLOL to 50mg  orally twice a day  Try a dose of CARDIZEM for HR >140

## 2018-03-31 NOTE — ED Provider Notes (Signed)
4:42 PM Single dose of 20mg  of cardizem given. HR immediately fell to 90. Will observe. Single dose of oral cardizem now. Then will transport back up with ongoing hospice services   Jola Schmidt, MD 03/31/18 [phone]

## 2018-03-31 NOTE — ED Notes (Signed)
Hospice bedside.

## 2018-03-31 NOTE — ED Notes (Signed)
[facility] called for tranport

## 2018-03-31 NOTE — Telephone Encounter (Signed)
Called and spoke with [name], hospice nurse. She stated that the patient is currently on her way to the hospital due to EMS being called. Patient was in SVT with bpm at 180. Patients BP was 200/100. Patient was in respiratory distress and pale. A neb treatment was given. Wanted RB to know as FYI.

## 2018-03-31 NOTE — Progress Notes (Signed)
[facility]) Hospital liaison note.  Notified by [facility] Case manager RN that patient was coming to ED for evaluation of severe shortness of breath.  Visited patient at bedside. She received treatment for SVT and shortness of breath and is much more comfortable now. Recommendations for future treatment reviewed with Dr. Venora Maples and relayed to her [facility] Case Manager RN.   Plan is for patient to return home today. [facility] RN will follow up her on Saturday 04/01/2018.  Please use [facility] for ambulance transport.  Please call with any hospice related questions or concerns.  Farrel Gordon, RN, [facility] Hospital Liaison (listed on [place]) [phone]

## 2018-03-31 NOTE — ED Provider Notes (Addendum)
8:25 PM- EMS was reluctant to take patient to her home.  Apparently during transfer from ED bed to EMS stretcher, she developed transient tachycardia in the range of 160 that improved after she was able to "calm down," according to the patient's nurse.  The nurse states that her heart rate decreased to 100.  The patient has received oral Cardizem 180 mg, following several IV doses of 20 mg Cardizem.  Patient is end-of-life palliative/hospice care patient.  Hospice is actively involved in her care and has seen the patient here in the ED.  Her metoprolol is being increased from 25 to 50 mg twice daily.  She has received a prescription for Cardizem pill in the pocket, for heart rates greater than 140.  There is no indication for further evaluation or treatment in the ED setting at this time.  There is no indication for hospitalization at this time.   Daleen Bo, MD 03/31/18 2029    Daleen Bo, MD 03/31/18 [phone]

## 2018-03-31 NOTE — ED Notes (Signed)
Cardizem successfully converted patient's HR from 175 to 90. Dr. Eulis Foster made aware.

## 2018-04-01 DIAGNOSIS — C3490 Malignant neoplasm of unspecified part of unspecified bronchus or lung: Secondary | ICD-10-CM | POA: Diagnosis not present

## 2018-04-01 DIAGNOSIS — J918 Pleural effusion in other conditions classified elsewhere: Secondary | ICD-10-CM | POA: Diagnosis not present

## 2018-04-01 DIAGNOSIS — J9610 Chronic respiratory failure, unspecified whether with hypoxia or hypercapnia: Secondary | ICD-10-CM | POA: Diagnosis not present

## 2018-04-01 DIAGNOSIS — D6869 Other thrombophilia: Secondary | ICD-10-CM | POA: Diagnosis not present

## 2018-04-01 DIAGNOSIS — I509 Heart failure, unspecified: Secondary | ICD-10-CM | POA: Diagnosis not present

## 2018-04-01 DIAGNOSIS — J449 Chronic obstructive pulmonary disease, unspecified: Secondary | ICD-10-CM | POA: Diagnosis not present

## 2018-04-03 ENCOUNTER — Telehealth: Payer: Self-pay | Admitting: Pulmonary Disease

## 2018-04-03 DIAGNOSIS — D6869 Other thrombophilia: Secondary | ICD-10-CM | POA: Diagnosis not present

## 2018-04-03 DIAGNOSIS — C3490 Malignant neoplasm of unspecified part of unspecified bronchus or lung: Secondary | ICD-10-CM | POA: Diagnosis not present

## 2018-04-03 DIAGNOSIS — I509 Heart failure, unspecified: Secondary | ICD-10-CM | POA: Diagnosis not present

## 2018-04-03 DIAGNOSIS — J449 Chronic obstructive pulmonary disease, unspecified: Secondary | ICD-10-CM | POA: Diagnosis not present

## 2018-04-03 DIAGNOSIS — J918 Pleural effusion in other conditions classified elsewhere: Secondary | ICD-10-CM | POA: Diagnosis not present

## 2018-04-03 DIAGNOSIS — J9610 Chronic respiratory failure, unspecified whether with hypoxia or hypercapnia: Secondary | ICD-10-CM | POA: Diagnosis not present

## 2018-04-03 NOTE — Telephone Encounter (Signed)
Started the patient is not feeling well.  This was discussed at the last patient's office visit patient was sent a referral to interventional radiology for a Pleurx drain placement.  [name] is not the one placing the Pleurx drain.  Has the patient been scheduled with interventional radiology yet?  If patient symptoms have worsened to the point where they would like an emergent Pleurx drain in place then they will need to present to an emergency room.  There is not a quicker way for [place] to be able to get this done.  Has the patient followed up with hospice regarding pain and management of her heart rate?    Wyn Quaker FNP

## 2018-04-03 NOTE — Telephone Encounter (Signed)
Attempted to call hospice RN Joseph Art but unable to reach her. Left message for Joseph Art to return call.  Called pt's son Legrand Como to see if an appt has been scheduled for pt to see IR and per Legrand Como, they have not heard from IR.  Stated to Legrand Como, if he feels like pt has worsened, pt might need to go to ER if they feel like the pleurx drain needs to be placed emergent.  I did stated to Legrand Como that I would try to call [facility] to see if they have an idea of when pt is going to be scheduled with IR to have drain placed. Stated to Legrand Como I would call him back once I spoke with someone if I would be able to speak with someone. Legrand Como expressed understanding.  Asked Legrand Como if [employer], hospice RN has said anything in regards to management for pain/heart rate and Legrand Como said that meds have been given to manage HR which pt has been taking to see if that would help. Renee said that morphine could be prescribed to help manage pain but pt did not want to go that route yet. Pt has been taking tramadol and hydrocodone to help with pain but does not want to go morphine route yet.  Called [facility] and spoke with Josh with IR to see if pt has been scheduled to have Pleurx Drain placed. Per Merrily Pew, he sees the order that was placed on 12/19 to have this done but nothing has been scheduled.  Spoke with Anderson Malta to see if there was any way pt could be placed on schedule to have pleurx drain. Stated concerns pt's family had. Per Anderson Malta, they could get pt on schedule tomorrow morning, 12/24 at 8am.  Pt would need to arrive at [facility] at 6:30am and go go short stay.  Pt would need to be NPO after midnight. Pt also needs to not take her diabetic meds AM 12/24.  Due to pt having allergy to iohexol, Anderson Malta is needing pt to do the 13hr prep to help out with the allergy: -at 7pm 12/23: pt to take pred 50mg  -at 1am 12/24: pt to take pred 50mg  -at 7am 12/24, 1 hr prior to procedure: pt to take pred 50mg  and benadryl 50mg   Called pt's son Legrand Como  letting him know that I did get pt on schedule for pleurx drain to be done tomorrow, 12/24. Asked michael if he could write down the instructions and he was currently driving and could not write anything down. Legrand Como asked if I could call him back in about 20 min and I stated to him I would.  Was able to speak with pt's daughter [name] and stated this information to her. [name] was questioning the 13hr prep to see if that was necessary and said she thought this is what got pt's HR up last time. I stated to [name] that I would call IR to see if they could call her directly. Called IR and spoke with Georgina Snell in regards to concerns of 13hr prep. Per Georgina Snell, due to pt having pleurx drain placed in chest, pt does not need to do 13hr prep as no contrast is placed. Called [place] back and stated this to her. [name] expressed understanding. Called Renee, Hospice RN and was able to speak with her to let her know this. Renee expressed understanding.Called Legrand Como back to let him know that I spoke with [name] and also clarified with him that pt does not need to do 13 hr prep as that was  a concern with [name] and him in regards to amount of pred that was needed to be involved with the prep but since no contrast media is involved, pt does not need to do that. Legrand Como expressed understanding. Nothing further needed.

## 2018-04-03 NOTE — Telephone Encounter (Signed)
Vonzella Nipple (patient's son) is calling concerned about patient's heart rate.  He is aware the Hospice nurse called this am and we put a message back for Brian's review.  He is asking if another provider could do a pleural drain this week since RB is on vacation. He is requesting a call back today.  CB is [phone].

## 2018-04-03 NOTE — Telephone Encounter (Signed)
[name] Aware.  Will close encounter.

## 2018-04-03 NOTE — Telephone Encounter (Signed)
Called pt's son Legrand Como who said pt went to ER 12/20 due to pt's HR and also due to pt being in respiratory distress. After pt was taken to ER, pt was sent up. Legrand Como states pt's HR has been going as high as 120-130 and sometimes higher.  Per Legrand Como when pt was at the hospital and even before pt had arrived at the hospital, pt's HR was as high as 178.  Since pt has been home from the ER, her O2 sats are currently at 95% on her O2 but pt's daughter Lattie Haw said her sats have dropped to 83% on the O2 when pt moves around. If pt is not moving around, sats remain stable at around 94% on the O2.  Pt's HR has been running between 110-127 today, 04/03/18 and BP today was 132/61.  While speaking with both pt's son Legrand Como and daughter Lattie Haw, they stated they knew Renee, Hospice RN had called office but they wanted to also call due to concerns. Legrand Como is asking if there is another provider that can place pleurx drain knowing that RB is on vacation. They stated that pt has had a thoracentesis performed multiple times and when this has been done, it has helped pt's breathing.  Legrand Como is very concerned about pt and this is why they are asking if another provider could do this for pt.   Due to RB being out of office, sending to APP of day. Aaron Edelman, please advise on this for pt. You can also see ER visit from 03/31/18 in regards to what was done on pt there.

## 2018-04-04 ENCOUNTER — Other Ambulatory Visit: Payer: Self-pay | Admitting: Radiology

## 2018-04-04 ENCOUNTER — Other Ambulatory Visit: Payer: Self-pay

## 2018-04-04 ENCOUNTER — Telehealth: Payer: Self-pay | Admitting: Emergency Medicine

## 2018-04-04 ENCOUNTER — Ambulatory Visit (HOSPITAL_COMMUNITY)
Admission: RE | Admit: 2018-04-04 | Discharge: 2018-04-04 | Disposition: A | Discharge status: Home / Self Care | Source: Ambulatory Visit | Attending: Emergency Medicine | Admitting: Emergency Medicine

## 2018-04-04 ENCOUNTER — Encounter (HOSPITAL_COMMUNITY): Payer: Self-pay

## 2018-04-04 ENCOUNTER — Other Ambulatory Visit: Payer: Self-pay | Admitting: Emergency Medicine

## 2018-04-04 DIAGNOSIS — Z888 Allergy status to other drugs, medicaments and biological substances status: Secondary | ICD-10-CM | POA: Insufficient documentation

## 2018-04-04 DIAGNOSIS — Z832 Family history of diseases of the blood and blood-forming organs and certain disorders involving the immune mechanism: Secondary | ICD-10-CM | POA: Diagnosis not present

## 2018-04-04 DIAGNOSIS — H9190 Unspecified hearing loss, unspecified ear: Secondary | ICD-10-CM | POA: Diagnosis not present

## 2018-04-04 DIAGNOSIS — Z9851 Tubal ligation status: Secondary | ICD-10-CM | POA: Insufficient documentation

## 2018-04-04 DIAGNOSIS — I509 Heart failure, unspecified: Secondary | ICD-10-CM | POA: Insufficient documentation

## 2018-04-04 DIAGNOSIS — Z7984 Long term (current) use of oral hypoglycemic drugs: Secondary | ICD-10-CM | POA: Diagnosis not present

## 2018-04-04 DIAGNOSIS — K219 Gastro-esophageal reflux disease without esophagitis: Secondary | ICD-10-CM | POA: Insufficient documentation

## 2018-04-04 DIAGNOSIS — I2510 Atherosclerotic heart disease of native coronary artery without angina pectoris: Secondary | ICD-10-CM | POA: Insufficient documentation

## 2018-04-04 DIAGNOSIS — C3490 Malignant neoplasm of unspecified part of unspecified bronchus or lung: Secondary | ICD-10-CM | POA: Insufficient documentation

## 2018-04-04 DIAGNOSIS — D6869 Other thrombophilia: Secondary | ICD-10-CM | POA: Diagnosis not present

## 2018-04-04 DIAGNOSIS — Z79899 Other long term (current) drug therapy: Secondary | ICD-10-CM | POA: Diagnosis not present

## 2018-04-04 DIAGNOSIS — J918 Pleural effusion in other conditions classified elsewhere: Secondary | ICD-10-CM | POA: Diagnosis not present

## 2018-04-04 DIAGNOSIS — I110 Hypertensive heart disease with heart failure: Secondary | ICD-10-CM | POA: Insufficient documentation

## 2018-04-04 DIAGNOSIS — M1990 Unspecified osteoarthritis, unspecified site: Secondary | ICD-10-CM | POA: Insufficient documentation

## 2018-04-04 DIAGNOSIS — Z955 Presence of coronary angioplasty implant and graft: Secondary | ICD-10-CM | POA: Diagnosis not present

## 2018-04-04 DIAGNOSIS — Z7982 Long term (current) use of aspirin: Secondary | ICD-10-CM | POA: Insufficient documentation

## 2018-04-04 DIAGNOSIS — J910 Malignant pleural effusion: Secondary | ICD-10-CM | POA: Diagnosis not present

## 2018-04-04 DIAGNOSIS — J90 Pleural effusion, not elsewhere classified: Secondary | ICD-10-CM

## 2018-04-04 DIAGNOSIS — E1142 Type 2 diabetes mellitus with diabetic polyneuropathy: Secondary | ICD-10-CM | POA: Insufficient documentation

## 2018-04-04 DIAGNOSIS — Z833 Family history of diabetes mellitus: Secondary | ICD-10-CM | POA: Insufficient documentation

## 2018-04-04 DIAGNOSIS — Z8249 Family history of ischemic heart disease and other diseases of the circulatory system: Secondary | ICD-10-CM | POA: Insufficient documentation

## 2018-04-04 DIAGNOSIS — Z86718 Personal history of other venous thrombosis and embolism: Secondary | ICD-10-CM | POA: Diagnosis not present

## 2018-04-04 DIAGNOSIS — J449 Chronic obstructive pulmonary disease, unspecified: Secondary | ICD-10-CM | POA: Insufficient documentation

## 2018-04-04 DIAGNOSIS — Z885 Allergy status to narcotic agent status: Secondary | ICD-10-CM | POA: Insufficient documentation

## 2018-04-04 DIAGNOSIS — Z8261 Family history of arthritis: Secondary | ICD-10-CM | POA: Insufficient documentation

## 2018-04-04 DIAGNOSIS — J9610 Chronic respiratory failure, unspecified whether with hypoxia or hypercapnia: Secondary | ICD-10-CM | POA: Diagnosis not present

## 2018-04-04 DIAGNOSIS — E785 Hyperlipidemia, unspecified: Secondary | ICD-10-CM | POA: Diagnosis not present

## 2018-04-04 DIAGNOSIS — Z87891 Personal history of nicotine dependence: Secondary | ICD-10-CM | POA: Diagnosis not present

## 2018-04-04 HISTORY — PX: IR US GUIDE BX ASP/DRAIN: IMG2392

## 2018-04-04 HISTORY — PX: IR GUIDED DRAIN W CATHETER PLACEMENT: IMG719

## 2018-04-04 LAB — CBC
HCT: 35.1 % — ABNORMAL LOW (ref 36.0–46.0)
Hemoglobin: 10.7 g/dL — ABNORMAL LOW (ref 12.0–15.0)
MCH: 28.5 pg (ref 26.0–34.0)
MCHC: 30.5 g/dL (ref 30.0–36.0)
MCV: 93.4 fL (ref 80.0–100.0)
Platelets: 383 10*3/uL (ref 150–400)
RBC: 3.76 MIL/uL — ABNORMAL LOW (ref 3.87–5.11)
RDW: 13.6 % (ref 11.5–15.5)
WBC: 19.5 10*3/uL — ABNORMAL HIGH (ref 4.0–10.5)
nRBC: 0 % (ref 0.0–0.2)

## 2018-04-04 LAB — BASIC METABOLIC PANEL
Anion gap: 15 (ref 5–15)
BUN: 25 mg/dL — AB (ref 8–23)
CO2: 30 mmol/L (ref 22–32)
Calcium: 8.8 mg/dL — ABNORMAL LOW (ref 8.9–10.3)
Chloride: 93 mmol/L — ABNORMAL LOW (ref 98–111)
Creatinine, Ser: 1.02 mg/dL — ABNORMAL HIGH (ref 0.44–1.00)
GFR calc Af Amer: >60 mL/min (ref >60)
GFR calc non Af Amer: 54 mL/min — ABNORMAL LOW (ref >60)
Glucose, Bld: 148 mg/dL — ABNORMAL HIGH (ref 70–99)
Potassium: 3.4 mmol/L — ABNORMAL LOW (ref 3.5–5.1)
Sodium: 138 mmol/L (ref 135–145)

## 2018-04-04 LAB — GLUCOSE, CAPILLARY
Glucose-Capillary: 175 mg/dL — ABNORMAL HIGH (ref 70–99)
Glucose-Capillary: 120 mg/dL — ABNORMAL HIGH (ref 70–99)

## 2018-04-04 LAB — PROTIME-INR
INR: 1.01
Prothrombin Time: 13.2 seconds (ref 11.4–15.2)

## 2018-04-04 MED ORDER — CEFAZOLIN SODIUM-DEXTROSE 2-4 GM/100ML-% IV SOLN
2.0000 g | INTRAVENOUS | Status: AC
  Administered 2018-04-04: 2 g via INTRAVENOUS

## 2018-04-04 MED ORDER — LIDOCAINE HCL 1 % IJ SOLN
INTRAMUSCULAR | Status: AC | PRN
  Administered 2018-04-04: 10 mL

## 2018-04-04 MED ORDER — SODIUM CHLORIDE 0.9 % IV SOLN: INTRAVENOUS | Status: DC

## 2018-04-04 MED ORDER — MIDAZOLAM HCL 2 MG/2ML IJ SOLN
INTRAMUSCULAR | Status: AC | PRN
  Administered 2018-04-04: 0.5 mg via INTRAVENOUS
  Administered 2018-04-04: 1 mg via INTRAVENOUS

## 2018-04-04 MED ORDER — FENTANYL CITRATE (PF) 100 MCG/2ML IJ SOLN
INTRAMUSCULAR | Status: AC | PRN
  Administered 2018-04-04: 25 ug via INTRAVENOUS
  Administered 2018-04-04: 50 ug via INTRAVENOUS

## 2018-04-04 MED ORDER — CEFAZOLIN SODIUM-DEXTROSE 2-4 GM/100ML-% IV SOLN
INTRAVENOUS | Status: AC
  Filled 2018-04-04: qty 100

## 2018-04-04 MED ORDER — FENTANYL CITRATE (PF) 100 MCG/2ML IJ SOLN
INTRAMUSCULAR | Status: AC
  Filled 2018-04-04: qty 4

## 2018-04-04 MED ORDER — MIDAZOLAM HCL 2 MG/2ML IJ SOLN
INTRAMUSCULAR | Status: AC
  Filled 2018-04-04: qty 4

## 2018-04-04 NOTE — H&P (Signed)
Chief Complaint: Patient was seen in consultation today for right pleurx catheter placement at the request of Byrum,Robert S  Referring Physician(s): Baltazar Apo S  Supervising Physician: Daryll Brod  Patient Status: [facility] - Out-pt  History of Present Illness: [name] is a 72 y.o. female   Lung Ca Recurrent Rt malignant pleural effusion [place] 12/12/17 and 02/09/18 +adenocarcinoma  Hospice now  Request for Rt Pleurx catheter placement per CCM  Past Medical History:  Diagnosis Date  . Adenocarcinoma, lung ([place])    upper left lobe  . Anemia in neoplastic disease   . Anxiety   . Arthritis    "all over my body"  . Asthma   . Asymptomatic varicose veins   . Atherosclerosis of native arteries of the extremities, unspecified   . Atrial fibrillation ([place])   . Bronchial pneumonia    history  . 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 Chemotherapy adverse reaction 09/15/11   "makes me light headed and pass out; this is 3rd blood transfusion since going thru it"  . CHF (congestive heart failure) ([place])   . Chronic airway obstruction, not elsewhere classified   . Chronic fatigue 08/19/2015  . Chronic sinusitis   . Complication of anesthesia    slow to wake up  . COPD with emphysema ([place])   . Coronary artery disease    Dr. Burt Knack had stress test done  . DDD (degenerative disc disease), lumbar   . Depressive disorder, not elsewhere classified   . Diseases of lips   . Disorder of bone and cartilage, unspecified   . DVT (deep venous thrombosis) ([place])   . Fibromyalgia   . Functional urinary incontinence   . GERD (gastroesophageal reflux disease)   . Hard of hearing, right   . Herpes zoster without mention of complication   . Hip pain, chronic 09/17/2014  . History of blood clots    "2 in my aorta"  . History of blood transfusion   . History of bronchitis   . History of echocardiogram    Echo 11/18: mild LVH, EF 60-65, no RWMA, Gr 1 DD, Ao Sclerosis, mild AI, RVH  with low nl RSVF, trivial eff  . Hx of radiation therapy 04/28/11 -06/08/11   right upper lobe-lung  . Hyperlipidemia   . Hypertension   . Kidney infection    "related to chemo"  . Lumbago   . Malignant neoplasm of bronchus and lung, unspecified site   . Mental disorder   . Muscle weakness (generalized)   . Non-small cell lung cancer ([place]) 03/31/2011   Adenocarcinoma Rt upper lobe mass  . Osteoarthrosis, unspecified whether generalized or localized, unspecified site   . Osteoporosis   . Other abnormal blood chemistry   . Other and unspecified hyperlipidemia   . PAD (peripheral artery disease) ([place])   . Pain in thoracic spine   . 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 Primary cancer of right upper lobe of lung ([place]) 02/08/2011   Biopsy Rt upper lobe mass 03/31/11>>adenocarcinoma DIAGNOSIS: Stage IIB/IIIA non-small cell lung cancer consistent with adenocarcinoma diagnosed in December 2012.   PRIOR THERAPY:  1) Status post radiation therapy to the right upper lobe lung mass completed on 06/04/2011 under the care of Dr. Pablo Ledger.  2) Systemic chemotherapy with carboplatin for AUC of 5 and Alimta 500 mg/M2 giving every 3   . Reflux   . Reflux esophagitis   . Respiratory failure with hypoxia 01/02/2010  . Restless legs syndrome (RLS)   .  Scoliosis   . Screening for thyroid disorder   . Shortness of breath    "anytime really"  . Sinus headache    "all the time"  . Spasm of muscle   . Syncope and collapse   . Type II or unspecified type diabetes mellitus with neurological manifestations, uncontrolled(250.62)   . Type II or unspecified type diabetes mellitus without mention of complication, not stated as uncontrolled   . Type II or unspecified type diabetes mellitus without mention of complication, not stated as uncontrolled   . Unspecified constipation   . Unspecified essential hypertension   . Unspecified hearing loss   . Unspecified hereditary and idiopathic peripheral neuropathy     . Unspecified vitamin D deficiency     Past Surgical History:  Procedure Laterality Date  . BRONCHOSCOPY  02/2011  . CARDIAC CATHETERIZATION    . CATARACT EXTRACTION  03/12/2013   Left Eye   . DILATION AND CURETTAGE OF UTERUS  1980's  . EYE SURGERY Left 2014   Cataract  . FETAL BLOOD TRANSFUSION  09/16/11  . JOINT REPLACEMENT  04/13/10   Left Hip  . LUNG LOBECTOMY  1986   left  . SEPTOPLASTY    . TOTAL HIP ARTHROPLASTY  01/2010   left  . TUBAL LIGATION  1980's    Allergies: Iohexol; Other; Anoro ellipta [umeclidinium-vilanterol]; Codeine; and Protonix [pantoprazole sodium]  Medications: Prior to Admission medications   Medication Sig Start Date End Date Taking? Authorizing Provider  LORazepam (ATIVAN) 0.5 MG tablet Take 0.5 mg by mouth every 4 (four) hours as needed for anxiety.   Yes [provider]  metoprolol tartrate (LOPRESSOR) 25 MG tablet Take 1 tablet (25 mg total) by mouth 2 (two) times daily. 01/23/18  Yes Weaver, Scott T, PA-C  traMADol (ULTRAM) 50 MG tablet TAKE 2 TABLETS (100MG TOTAL) BY MOUTH EVERY 6 HOURS AS NEEDED FOR SEVERE PAIN Patient taking differently: Take 100 mg by mouth every 6 (six) hours as needed for moderate pain.  07/25/17  Yes [name], MD  ACCU-CHEK AVIVA PLUS test strip USE AS DIRECTED TO CHECK BLOOD SUGAR THREE TIMES DAILY 11/14/17   [name], MD  albuterol (PROVENTIL HFA;VENTOLIN HFA) 108 (90 Base) MCG/ACT inhaler Inhale 2 puffs into the lungs every 6 (six) hours as needed for wheezing or shortness of breath. 06/01/17   Collene Gobble, MD  albuterol (PROVENTIL) (2.5 MG/3ML) 0.083% nebulizer solution USE 1 VIAL VIA NEBULIZER EVERY 6 HOURS AS NEEDED FOR WHEEZING OR SHORTNESS OF BREATH Patient taking differently: Take 2.5 mg by nebulization every 6 (six) hours as needed for wheezing or shortness of breath.  08/03/17   Collene Gobble, MD  ALPRAZolam Duanne Moron) 0.5 MG tablet TAKE 1 TABLET BY MOUTH 3 TIMES A DAY AS NEEDED FOR ANXIETY 01/30/18    Eugenie Filler, MD  aspirin 81 MG tablet Take 81 mg by mouth daily.    [provider]  Azelastine HCl 137 MCG/SPRAY SOLN Place 1 puff into the nose daily. 03/03/18   Martyn Ehrich, NP  azithromycin (ZITHROMAX) 250 MG tablet 553m (two tablets) today, then 2563m(1 tablet) for the next 4 days 03/24/18   MaLauraine RinneNP  benzonatate (TESSALON PERLES) 100 MG capsule Take 1 capsule (100 mg total) by mouth every 6 (six) hours as needed for cough. 02/15/18 02/15/19  WaMartyn EhrichNP  Blood Glucose Monitoring Suppl (ACCU-CHEK AVIVA PLUS) w/Device KIT Use as directed to check blood sugar Dx: E11.9,  E11.49 04/21/16   Estill Dooms, MD  Calcium Carbonate-Vit D-Min (CALTRATE PLUS PO) Take 1 tablet by mouth every other day.     [provider]  diltiazem (CARDIZEM) 120 MG tablet Take 1 tablet (120 mg total) by mouth every 6 (six) hours as needed (rapid heart rate >140). 03/31/18   Jola Schmidt, MD  diphenhydrAMINE (BENADRYL) 50 MG tablet Take one 48m tablet 1 hour before CTA 03/27/18   MLauraine Rinne NP  Fluticasone-Umeclidin-Vilant (TRELEGY ELLIPTA) 100-62.5-25 MCG/INH AEPB Inhale 1 puff into the lungs daily. 03/24/18   MLauraine Rinne NP  Fluticasone-Umeclidin-Vilant (TRELEGY ELLIPTA) 100-62.5-25 MCG/INH AEPB Inhale 1 puff into the lungs daily. 03/24/18   MLauraine Rinne NP  furosemide (LASIX) 40 MG tablet Take 1 tablet (40 mg total) by mouth daily. 01/23/18 02/22/18  WRichardson DoppT, PA-C  guaiFENesin (MUCINEX) 600 MG 12 hr tablet Take 1 tablet (600 mg total) by mouth 2 (two) times daily. 03/22/18   BCollene Gobble MD  guaiFENesin-dextromethorphan (ROBITUSSIN DM) 100-10 MG/5ML syrup Take 5 mLs by mouth every 4 (four) hours as needed for cough. 02/11/18   SThurnell Lose MD  HYDROcodone-acetaminophen (NORCO) 10-325 MG tablet Take 1 tablet by mouth 2 (two) times daily as needed. 03/03/18   WMartyn Ehrich NP  HYDROcodone-homatropine (HYDROMET) 5-1.5 MG/5ML syrup Take  5-10cc every 6 hours if needed for cough 03/13/18   Byrum, RRose Fillers MD  loratadine (CLARITIN) 10 MG tablet Take 1 tablet (10 mg total) by mouth daily. 01/31/18   TEugenie Filler MD  metFORMIN (GLUCOPHAGE) 500 MG tablet Take 1 tablet (500 mg total) by mouth 2 (two) times daily. 12/15/17   Hongalgi, ALenis Dickinson MD  nitroGLYCERIN (NITROSTAT) 0.4 MG SL tablet Place 1 tablet (0.4 mg total) under the tongue every 5 (five) minutes as needed. 01/23/18   WRichardson DoppT, PA-C  ondansetron (ZOFRAN) 4 MG tablet Take 1 tablet (4 mg total) by mouth every 8 (eight) hours as needed for nausea or vomiting. 01/16/18   WMartyn Ehrich NP  OXYGEN Inhale 3-4 L into the lungs.     [provider]  pantoprazole (PROTONIX) 40 MG tablet Take 1 tablet (40 mg total) by mouth 2 (two) times daily before a meal. 01/30/18   TEugenie Filler MD  potassium chloride (K-DUR,KLOR-CON) 10 MEQ tablet Take 1 tablet (10 mEq total) by mouth daily. 01/23/18   WRichardson DoppT, PA-C  predniSONE (DELTASONE) 10 MG tablet Take 1 tablet (10 mg total) by mouth daily with breakfast. 01/13/18   WMartyn Ehrich NP  predniSONE (DELTASONE) 10 MG tablet 4 tabs for 2 days, then 3 tabs for 2 days, 2 tabs for 2 days, then 1 tab for 2 days, then stop 03/24/18   MLauraine Rinne NP  predniSONE (DELTASONE) 10 MG tablet Take 5 tablets (568m 13 hours before CTA, then 5068m hours before CTA, and 93m52main 1 hour before CTA 03/27/18   MackLauraine Rinne  Respiratory Therapy Supplies (FLUTTER) DEVI Use a directed 11/08/17   WalsMartyn Ehrich  senna (SENOKOT) 8.6 MG tablet Take 1 tablet by mouth 2 (two) times daily as needed for constipation.     [provider]  simethicone (MYLICON) 80 MG chewable tablet Chew 1 tablet (80 mg total) by mouth 4 (four) times daily as needed for flatulence. 01/30/18   ThomEugenie Filler  sodium chloride (OCEAN) 0.65 % SOLN nasal spray Place 1 spray into both  nostrils as needed for congestion. 01/30/18    Eugenie Filler, MD  TRELEGY ELLIPTA 100-62.5-25 MCG/INH AEPB INHALE 1 PUFF BY MOUTH ONCE DAILY 09/26/17   Collene Gobble, MD  valsartan (DIOVAN) 40 MG tablet Take 1 tablet (40 mg total) by mouth daily. 01/23/18   Richardson Dopp T, PA-C  calcium carbonate 200 MG capsule Take 250 mg by mouth daily. Does not take everyday  01/23/18  [provider]     Family History  Problem Relation Age of Onset  . Bone cancer Father   . Cancer Father        Bone  . Diabetes Mother   . Deep vein thrombosis Mother   . Heart disease Mother        Heart Disease before age 23-  PVD  . Hyperlipidemia Mother   . Hypertension Mother   . Varicose Veins Mother   . Arthritis Daughter     Social History   Socioeconomic History  . Marital status: Divorced    Spouse name: Not on file  . Number of children: 3  . Years of education: Not on file  . Highest education level: Not on file  Occupational History  . Occupation: disabled  . Occupation: RETIRED    Employer: RETIRED  Social Needs  . Financial resource strain: Not on file  . Food insecurity:    Worry: Not on file    Inability: Not on file  . Transportation needs:    Medical: Not on file    Non-medical: Not on file  Tobacco Use  . Smoking status: Former Smoker    Packs/day: 1.00    Years: 40.00    Pack years: 40.00    Types: Cigarettes    Last attempt to quit: 02/10/1998    Years since quitting: 20.1  . Smokeless tobacco: Never Used  Substance and Sexual Activity  . Alcohol use: No  . Drug use: No  . Sexual activity: Never  Lifestyle  . Physical activity:    Days per week: Not on file    Minutes per session: Not on file  . Stress: Not on file  Relationships  . Social connections:    Talks on phone: Not on file    Gets together: Not on file    Attends religious service: Not on file    Active member of club or organization: Not on file    Attends meetings of clubs or organizations: Not on file    Relationship status:  Not on file  Other Topics Concern  . Not on file  Social History Narrative  . Not on file     Review of Systems: A 12 point ROS discussed and pertinent positives are indicated in the HPI above.  All other systems are negative.  Review of Systems  Constitutional: Positive for activity change, appetite change, fatigue and unexpected weight change.  Respiratory: Positive for cough and shortness of breath.   Cardiovascular: Negative for chest pain.  Gastrointestinal: Negative for abdominal pain.  Neurological: Positive for weakness.  Psychiatric/Behavioral: Positive for decreased concentration.    Vital Signs: BP (!) 149/71   Pulse (!) 114   Temp 98.4 F (36.9 C) (Oral)   Ht _0  (1.676 m)   Wt 155 lb (70.3 kg)   SpO2 92%   BMI 25.02 kg/m   Physical Exam Vitals signs reviewed.  Cardiovascular:     Rate and Rhythm: Normal rate and regular rhythm.  Pulmonary:     Breath sounds:  Wheezing present.     Comments: Diminished on Rt Abdominal:     General: Bowel sounds are normal.     Palpations: Abdomen is soft.  Musculoskeletal: Normal range of motion.  Skin:    General: Skin is warm and dry.  Neurological:     General: No focal deficit present.     Mental Status: She is oriented to person, place, and time.  Psychiatric:        Mood and Affect: Mood normal.     Comments: Easily falls to sleep--- groggy  Consented with Dtr at bedside     Imaging: Ct Angio Chest W/cm &/or Wo Cm  Result Date: 03/29/2018 CLINICAL DATA:  72 year old female with treated lung cancer. Right lateral rib and chest pain for 2 weeks. Shortness of breath. Abnormal D-dimer. Status post thoracentesis on 02/09/2018. EXAM: CT ANGIOGRAPHY CHEST WITH CONTRAST TECHNIQUE: Multidetector CT imaging of the chest was performed using the standard protocol during bolus administration of intravenous contrast. Multiplanar CT image reconstructions and MIPs were obtained to evaluate the vascular anatomy. CONTRAST:   51m ISOVUE-370 IOPAMIDOL (ISOVUE-370) INJECTION 76% COMPARISON:  Chest radiographs 02/15/2018 and earlier. Chest CT 02/07/2018 FINDINGS: Cardiovascular: Good contrast bolus timing in the pulmonary arterial tree. Mild respiratory motion. No focal filling defect identified in the pulmonary arteries to suggest acute pulmonary embolism. Calcified coronary artery and aortic atherosclerosis. No cardiomegaly or pericardial effusion. Mediastinum/Nodes: Fluid-filled esophagus to the gastroesophageal junction. No mediastinal lymphadenopathy. Lungs/Pleura: Large low-density right pleural effusion has re-accumulated and is progressed compared to the 02/07/2018 CT. Combined enhancing compressive atelectasis in the lower lobe with confluent abnormal right upper lobe masslike, heterogeneously enhancing consolidation which has not improved since 02/07/2018. Mass effect on the right lobar bronchi is mild but increased. There could be abnormal soft tissue encasing the lobar airways at the right hilum. Trachea is patent. Left lung airways are stable. Mild left lung atelectasis compared to October. Increasing 7 to 8 millimeter mid left lung nodule on series 11, image 69 (4-5 millimeters in October). Underlying centrilobular emphysema. No left pleural effusion. Upper Abdomen: Stable including 12 millimeter low-density left adrenal nodule which is probably a benign adenoid. No obstructing lesion identified at the gastroesophageal junction. Musculoskeletal: Stable. T4-T5 compression fractures, the former is augmented. Chronic right posterolateral rib deformities with dystrophic calcification. Review of the MIP images confirms the above findings. IMPRESSION: 1. Negative for acute pulmonary embolus. 2. Large right pleural effusion is re-accumulated/progressed since the end of October. 3. Combined compressive atelectasis (right lower lobe) and abnormal mass-like consolidation (right upper lobe). Mass effect on the right lung airways at the  hilum suspicious for malignant soft tissue. 4. Probable mid left lung round pulmonary metastasis has increased to 7-8 mm since October. 5. Fluid-filled esophagus although no obstructing lesion identified at the gastroesophageal junction. Electronically Signed   By: HGenevie AnnM.D.   On: 03/29/2018 09:36   Dg Chest Portable 1 View  Result Date: 03/31/2018 CLINICAL DATA:  Short of breath.  History of lung cancer EXAM: PORTABLE CHEST 1 VIEW COMPARISON:  CT chest 03/29/2018, chest two-view 02/15/2018 FINDINGS: Compared with prior chest x-ray 02/15/2018, right upper lobe masslike density has progressed in size. Possible surrounding pneumonia. Progressive right lower lobe airspace disease. Progression of right pleural effusion. Findings appear similar to the recent chest CT. Negative for heart failure. Postsurgical lobectomy changes on the left. Left lung remains clear. IMPRESSION: Right upper lobe mass lesion with surrounding airspace disease and right pleural effusion. Possible pneumonia or malignant  effusion. Electronically Signed   By: Franchot Gallo M.D.   On: 03/31/2018 15:32   Vas [place] Lower Extremity Venous (dvt)  Result Date: 03/28/2018  Lower Venous Study Other Indications: Elevated d-dimer, increasing shortness of breath. Risk Factors: Cancer Lung cancer COPD. Comparison Study: Previous bilateral venous duplex examinations performed in                   12/14/17 and 01/29/18 were negative for DVT. Performing Technologist: Chesley Noon RVT  Examination Guidelines: A complete evaluation includes B-mode imaging, spectral Doppler, color Doppler, and power Doppler as needed of all accessible portions of each vessel. Bilateral testing is considered an integral part of a complete examination. Limited examinations for reoccurring indications may be performed as noted.  Right Venous Findings: +---------+---------------+---------+-----------+----------+-------------------+           CompressibilityPhasicitySpontaneityPropertiesSummary             +---------+---------------+---------+-----------+----------+-------------------+ CFV      Full           Yes      Yes                                      +---------+---------------+---------+-----------+----------+-------------------+ SFJ      Full           Yes      Yes                                      +---------+---------------+---------+-----------+----------+-------------------+ FV Prox  Full           Yes      Yes                                      +---------+---------------+---------+-----------+----------+-------------------+ FV Mid   Full           Yes      Yes                                      +---------+---------------+---------+-----------+----------+-------------------+ FV DistalFull           Yes      Yes                                      +---------+---------------+---------+-----------+----------+-------------------+ PFV      Full                                                             +---------+---------------+---------+-----------+----------+-------------------+ POP      Full           Yes      Yes                                      +---------+---------------+---------+-----------+----------+-------------------+ PTV      Full  Yes      Yes                                      +---------+---------------+---------+-----------+----------+-------------------+ PERO                    Yes      Yes                  not well visualized +---------+---------------+---------+-----------+----------+-------------------+ Gastroc  Full                                                             +---------+---------------+---------+-----------+----------+-------------------+ GSV      Full           Yes      Yes                                      +---------+---------------+---------+-----------+----------+-------------------+  Left Venous  Findings: +---------+---------------+---------+-----------+----------+-------------------+          CompressibilityPhasicitySpontaneityPropertiesSummary             +---------+---------------+---------+-----------+----------+-------------------+ CFV      Full           Yes      Yes                                      +---------+---------------+---------+-----------+----------+-------------------+ SFJ      Full           Yes      Yes                                      +---------+---------------+---------+-----------+----------+-------------------+ FV Prox  Full           Yes      Yes                                      +---------+---------------+---------+-----------+----------+-------------------+ FV Mid   Full           Yes      Yes                                      +---------+---------------+---------+-----------+----------+-------------------+ FV DistalFull           Yes      Yes                                      +---------+---------------+---------+-----------+----------+-------------------+ PFV      Full                                                             +---------+---------------+---------+-----------+----------+-------------------+  POP      Full           Yes      Yes                                      +---------+---------------+---------+-----------+----------+-------------------+ PTV      Full           Yes      Yes                                      +---------+---------------+---------+-----------+----------+-------------------+ PERO                    Yes      Yes                  not well visualized +---------+---------------+---------+-----------+----------+-------------------+    Summary: Right: No evidence of deep vein thrombosis in the lower extremity. No indirect evidence of obstruction proximal to the inguinal ligament. No cystic structure found in the popliteal fossa. Left: No evidence of deep vein  thrombosis in the lower extremity. No indirect evidence of obstruction proximal to the inguinal ligament. No cystic structure found in the popliteal fossa.  *See table(s) above for measurements and observations. Electronically signed by Jenkins Rouge MD on 03/28/2018 at 6:03:58 PM.    Final     Labs:  CBC: Recent Labs    02/09/18 0419 02/15/18 1551 03/24/18 1545 03/31/18 1504  WBC 9.7 10.1 16.1* 20.3*  HGB 9.8* 10.7* 11.0* 10.9*  HCT 32.8* 32.6* 33.5* 34.8*  PLT 266 296.0 368.0 426*    COAGS: Recent Labs    01/27/18 1526 02/08/18 2141  INR 0.88 0.96    BMP: Recent Labs    02/08/18 1342 02/09/18 0419 02/10/18 0306 02/15/18 1551 03/24/18 1545 03/31/18 1504  NA 139 140 137 137 134* 139  K 4.0 3.4* 3.9 4.2 3.4* 3.2*  CL 103 103 101 98 89* 93*  CO2 _0 GLUCOSE 157* 91 108* 175* 138* 201*  BUN 14 17 25* _1 CALCIUM 9.2 8.5* 8.9 9.1 8.1* 8.3*  CREATININE 1.02* 1.21* 1.22* 1.14 1.02 1.17*  GFRNONAA 53* 43* 43*  --   --  46*  GFRAA >60 50* 50*  --   --  53*    LIVER FUNCTION TESTS: Recent Labs    02/07/18 1108 02/08/18 1342 02/09/18 0419 03/24/18 1545  BILITOT 0.5 0.7 0.8 0.5  AST 13* 16 14* 14  ALT _2 ALKPHOS 84 72 63 72  PROT 6.6 6.4* 5.5*  5.5* 6.3  ALBUMIN 3.6 3.5 3.2* 3.6    TUMOR MARKERS: No results for input(s): AFPTM, CEA, CA199, CHROMGRNA in the last 8760 hours.  Assessment and Plan:  Lung Ca Recurrent malignant effusion Scheduled for Rt PleurX catheter placement Pt and Dtr are aware of procedure benefits and risks- including but not limited to Infection; bleeding; pneumothorax; damage to surroubnding structures Agreeable to proceed Consent signed andin chart  Thank you for this interesting consult.  I greatly enjoyed meeting [name] and look forward to participating in their care.  A copy of this report was sent to the requesting provider on this date.  Electronically Signed: Lavonia Drafts,  PA-C 04/04/2018, 7:54 AM   I  spent a total of  30 Minutes   in face to face in clinical consultation, greater than 50% of which was counseling/coordinating care for Rt PleurX catheter placement

## 2018-04-04 NOTE — Telephone Encounter (Signed)
Interventional Radiology I spoke to [place] at Dr. Sudie Bailey office and informed her that I would be faxing info packet for order of pleurx bottles to Dr. Sudie Bailey office.  The fax number is  [phone].  Attention to [facility].  They will be responsible for filling out packet and faxing to [facility]. [facility]

## 2018-04-04 NOTE — Discharge Instructions (Signed)
Indwelling Pleural Catheter Home Guide  An indwelling pleural catheter is a thin, flexible tube that is inserted under your skin and into your chest. The catheter drains excess fluid that collects in the area between the chest wall and the lungs (pleural space).After the catheter is inserted, it can be attached to a bottle that collects fluid. The pleural catheter will allow you to drain fluid from your chest at home on a regular basis (sometimes daily). This will eliminate the need for frequent visits to the hospital or clinic to drain the fluid. The catheter may be removed after the excess fluid problem is resolved, usually after 2-3 months. It is important to follow instructions from your health care provider about how to drain and care for your catheter. What are the risks? Generally, this is a safe procedure. However, problems may occur, including:  Infection.  Skin damage around the catheter.  Lung damage.  Failure of the chest tube to work properly.  Spreading of cancer cells along the catheter, if you have cancer. Supplies needed:  Vacuum-sealed drainage bottle with attached drainage line.  Sterile dressing.  Sterile alcohol pads.  Sterile gloves.  Valve cap.  Sterile gauze pads, 4  4 inch (10 cm  10 cm).  Tape.  Adhesive dressing.  Sterile foam catheter pad. How to care for your catheter and insertion site  Wash your hands with soap and warm water before and after touching the catheter or insertion site. If soap and water are not available, use hand sanitizer.  Check your bandage (dressing) daily to make sure it is clean and dry.  Keep the skin around the catheter clean and dry.  Check the catheter regularly for any cracks or kinks in the tubing.  Check your catheter insertion site every day for signs of infection. Check for: ? Skin breakdown. ? Redness, swelling, or pain. ? Fluid or blood. ? Warmth. ? Pus or a bad smell. How to drain your catheter You  may need to drain your catheter every day, or more or less often as told by your health care provider. Follow instructions from your health care provider about how to drain your catheter. You may also refer to instructions that come with the drainage system. To drain the catheter: 1. Wash your hands with soap and warm water. If soap and water are not available, use hand sanitizer. 2. Carefully remove the dressing from around the catheter. 3. Wash your hands again. 4. Put on the gloves provided. 5. Prepare the vacuum-sealed drainage bottle and drainage line. Close the drainage line of the vacuum-sealed drainage bottle by squeezing the pinch clamp or rolling the wheel of the roller clamp toward the bottle. The vacuum in the bottle will be lost if the line is not closed completely. 6. Remove the access tip cover from the drainage line. Do not touch the end. Set it on a sterile surface. 7. Remove the catheter valve cap and throw it away. 8. Use an alcohol pad to clean the end of the catheter. 9. Insert the access tip into the catheter valve. Make sure the valve and access tip are securely connected. Listen for a click to confirm that they are connected. 10. Insert the T plunger to break the vacuum seal on the drainage bottle. 11. Open the clamp on the drainage line. 12. Allow the catheter to drain. Keep the catheter and the drainage bottle below the level of your chest. There may be a one-way valve on the end of the  tubing that will allow liquid and air to flow out of the catheter without letting air inside. 13. Drain the amount of fluid as told by your health care provider. It usually takes 5-15 minutes. Do not drain more than 1000 mL of fluid. You may feel a little discomfort while you are draining. If the pain is severe, stop draining and contact your health care provider. 14. After you finish draining the catheter, remove the drainage bottle tubing from the catheter. 15. Use a clean alcohol pad to  wipe the catheter tip. 16. Place a clean cap on the end of the catheter. 17. Use an alcohol pad to clean the skin around the catheter. 18. Allow the skin to air-dry. 19. Put the catheter pad on your skin. Curl the catheter into loops and place it on the pad. Do not place the catheter on your skin. 20. Replace the dressing over the catheter. 21. Discard the drainage bottle as instructed by your health care provider. Do not reuse the drainage bottle. How to change your dressing Change your dressing at least once a week, or more often if needed to keep the dressing dry. Be sure to change the dressing whenever it becomes moist. Your health care provider will tell you how often to change your dressing. 1. Wash your hands with soap and warm water. If soap and water are not available, use hand sanitizer. 2. Gently remove the old dressing. Avoid using scissors to remove the dressing. Sharp objects may damage the catheter. 3. Wash the skin around the insertion site with mild, fragrance-free soap and warm water. Rinse well, then pat the area dry with a clean cloth. 4. Check the skin around the catheter for signs of infection. Check for: ? Skin breakdown. ? Redness, swelling, or pain. ? Fluid or blood. ? Warmth. ? Pus or a bad smell. 5. If your catheter was stitched (sutured) to your skin, look at the suture to make sure it is still anchored in your skin. 6. Do not apply creams, ointments, or alcohol to the area. Let your skin air-dry completely before you apply a new dressing. 7. Curl the catheter into loops and place it on the sterile catheter pad. Do not place the catheter on your skin. 8. If you do not have a pad, use a clean dressing. Slide the dressing under the disk that holds the drainage catheter in place. 9. Use gauze to cover the catheter and the catheter pad. The catheter should rest on the pad or dressing, not on your skin. 10. Tape the dressing to your skin. You may be instructed to use an  adhesive dressing covering instead of gauze and tape. 11. Wash your hands with soap and warm water. If soap and water are not available, use hand sanitizer. General recommendations  Always wash your hands with soap and warm water before and after caring for your catheter and drainage bottle. Use a mild, fragrance-free soap. If soap and water are not available, use hand sanitizer.  Always make sure there are no leaks in the catheter or drainage bottle.  Each time you drain the catheter, note the color and amount of fluid.  Do not touch the tip of the catheter or the drainage bottle tubing.  Do not reuse drainage bottles.  Do not take baths, swim, or use a hot tub until your health care provider approves. Ask your health care provider if you may take showers. You may only be allowed to take sponge baths.  Take  deep breaths regularly, followed by a cough. Doing this can help to prevent lung infection. Contact a health care provider if:  You have any questions about caring for your catheter or drainage bottle.  You still have pain at the catheter insertion site more than 2 days after your procedure.  You have pain while draining your catheter.  Your catheter becomes bent, twisted, or cracked.  The connection between the catheter and the collection bottle becomes loose.  You have any of these around your catheter insertion site or coming from it: ? Skin breakdown. ? Redness, swelling, or pain. ? Fluid or blood. ? Warmth. ? Pus or a bad smell. Get help right away if:  You have a fever or chills.  You have chest pain.  You have dizziness or shortness of breath.  You have severe redness, swelling, or pain at your catheter insertion site.  The catheter comes out.  The catheter is blocked or clogged. Summary  An indwelling pleural catheter is a thin, flexible tube that is inserted under your skin and into your chest. The catheter drains excess fluid that collects in the area  between the chest wall and the lungs (pleural space).  It is important to follow instructions from your health care provider about how to drain and care for your catheter.  Do not touch the tip of the catheter or the drainage bottle tubing.  Always wash your hands with soap and water before and after caring for your catheter and drainage bottle. If soap and water are not available, use hand sanitizer. This information is not intended to replace advice given to you by your health care provider. Make sure you discuss any questions you have with your health care provider. Document Released: 07/22/2016 Document Revised: 07/22/2016 Document Reviewed: 07/22/2016 Elsevier Interactive Patient Education  [employer].

## 2018-04-04 NOTE — Progress Notes (Signed)
Care manager came up and spoke with patient and family to arrange home health.  Patient was sent home with drainage bottles.

## 2018-04-04 NOTE — Telephone Encounter (Signed)
Forms have been received, completed, signed by Aaron Edelman and faxed to [place].  Received successful fax. Forms have been placed in scan bin on A pod to be scanned into pt's chart.  Heather with IR has been made aware.  Nothing further is needed.

## 2018-04-04 NOTE — Progress Notes (Signed)
Dressing to right chest is CDI.

## 2018-04-04 NOTE — Procedures (Signed)
Lung ca, rt malignant pleural eff  S/p Rt pleurX drain insertion No comp Stable 2.4 L removed Full report in pacs Use prn

## 2018-04-04 NOTE — Discharge Planning (Signed)
[facility] consulted regarding [place] orders and DME needs.  EDCM met with pt and family at bedside who states she is active with [facility].  EDCM reviewed chart to find plurex management and equipment has been arranged by MD.  No further EDCM needs needed at this time.

## 2018-04-06 ENCOUNTER — Encounter (HOSPITAL_COMMUNITY): Payer: Self-pay

## 2018-04-06 ENCOUNTER — Other Ambulatory Visit: Payer: Self-pay | Admitting: Emergency Medicine

## 2018-04-06 DIAGNOSIS — J449 Chronic obstructive pulmonary disease, unspecified: Secondary | ICD-10-CM | POA: Diagnosis not present

## 2018-04-06 DIAGNOSIS — J9610 Chronic respiratory failure, unspecified whether with hypoxia or hypercapnia: Secondary | ICD-10-CM | POA: Diagnosis not present

## 2018-04-06 DIAGNOSIS — C3490 Malignant neoplasm of unspecified part of unspecified bronchus or lung: Secondary | ICD-10-CM | POA: Diagnosis not present

## 2018-04-06 DIAGNOSIS — D6869 Other thrombophilia: Secondary | ICD-10-CM | POA: Diagnosis not present

## 2018-04-06 DIAGNOSIS — J90 Pleural effusion, not elsewhere classified: Secondary | ICD-10-CM

## 2018-04-06 DIAGNOSIS — J918 Pleural effusion in other conditions classified elsewhere: Secondary | ICD-10-CM | POA: Diagnosis not present

## 2018-04-06 DIAGNOSIS — I509 Heart failure, unspecified: Secondary | ICD-10-CM | POA: Diagnosis not present

## 2018-04-07 DIAGNOSIS — J9610 Chronic respiratory failure, unspecified whether with hypoxia or hypercapnia: Secondary | ICD-10-CM | POA: Diagnosis not present

## 2018-04-07 DIAGNOSIS — I509 Heart failure, unspecified: Secondary | ICD-10-CM | POA: Diagnosis not present

## 2018-04-07 DIAGNOSIS — J449 Chronic obstructive pulmonary disease, unspecified: Secondary | ICD-10-CM | POA: Diagnosis not present

## 2018-04-07 DIAGNOSIS — J918 Pleural effusion in other conditions classified elsewhere: Secondary | ICD-10-CM | POA: Diagnosis not present

## 2018-04-07 DIAGNOSIS — D6869 Other thrombophilia: Secondary | ICD-10-CM | POA: Diagnosis not present

## 2018-04-07 DIAGNOSIS — C3490 Malignant neoplasm of unspecified part of unspecified bronchus or lung: Secondary | ICD-10-CM | POA: Diagnosis not present

## 2018-04-10 DIAGNOSIS — J9610 Chronic respiratory failure, unspecified whether with hypoxia or hypercapnia: Secondary | ICD-10-CM | POA: Diagnosis not present

## 2018-04-10 DIAGNOSIS — J449 Chronic obstructive pulmonary disease, unspecified: Secondary | ICD-10-CM | POA: Diagnosis not present

## 2018-04-10 DIAGNOSIS — C3490 Malignant neoplasm of unspecified part of unspecified bronchus or lung: Secondary | ICD-10-CM | POA: Diagnosis not present

## 2018-04-10 DIAGNOSIS — J918 Pleural effusion in other conditions classified elsewhere: Secondary | ICD-10-CM | POA: Diagnosis not present

## 2018-04-10 DIAGNOSIS — D6869 Other thrombophilia: Secondary | ICD-10-CM | POA: Diagnosis not present

## 2018-04-10 DIAGNOSIS — I509 Heart failure, unspecified: Secondary | ICD-10-CM | POA: Diagnosis not present

## 2018-04-11 DIAGNOSIS — J449 Chronic obstructive pulmonary disease, unspecified: Secondary | ICD-10-CM | POA: Diagnosis not present

## 2018-04-11 DIAGNOSIS — I509 Heart failure, unspecified: Secondary | ICD-10-CM | POA: Diagnosis not present

## 2018-04-11 DIAGNOSIS — C3490 Malignant neoplasm of unspecified part of unspecified bronchus or lung: Secondary | ICD-10-CM | POA: Diagnosis not present

## 2018-04-11 DIAGNOSIS — J9610 Chronic respiratory failure, unspecified whether with hypoxia or hypercapnia: Secondary | ICD-10-CM | POA: Diagnosis not present

## 2018-04-11 DIAGNOSIS — J918 Pleural effusion in other conditions classified elsewhere: Secondary | ICD-10-CM | POA: Diagnosis not present

## 2018-04-11 DIAGNOSIS — D6869 Other thrombophilia: Secondary | ICD-10-CM | POA: Diagnosis not present

## 2018-04-12 DIAGNOSIS — F339 Major depressive disorder, recurrent, unspecified: Secondary | ICD-10-CM | POA: Diagnosis not present

## 2018-04-12 DIAGNOSIS — I10 Essential (primary) hypertension: Secondary | ICD-10-CM | POA: Diagnosis not present

## 2018-04-12 DIAGNOSIS — J9610 Chronic respiratory failure, unspecified whether with hypoxia or hypercapnia: Secondary | ICD-10-CM | POA: Diagnosis not present

## 2018-04-12 DIAGNOSIS — R4020 Unspecified coma: Secondary | ICD-10-CM | POA: Diagnosis not present

## 2018-04-12 DIAGNOSIS — C3490 Malignant neoplasm of unspecified part of unspecified bronchus or lung: Secondary | ICD-10-CM | POA: Diagnosis not present

## 2018-04-12 DIAGNOSIS — E1159 Type 2 diabetes mellitus with other circulatory complications: Secondary | ICD-10-CM | POA: Diagnosis not present

## 2018-04-12 DIAGNOSIS — J918 Pleural effusion in other conditions classified elsewhere: Secondary | ICD-10-CM | POA: Diagnosis not present

## 2018-04-12 DIAGNOSIS — I2510 Atherosclerotic heart disease of native coronary artery without angina pectoris: Secondary | ICD-10-CM | POA: Diagnosis not present

## 2018-04-12 DIAGNOSIS — I499 Cardiac arrhythmia, unspecified: Secondary | ICD-10-CM | POA: Diagnosis not present

## 2018-04-12 DIAGNOSIS — R404 Transient alteration of awareness: Secondary | ICD-10-CM | POA: Diagnosis not present

## 2018-04-12 DIAGNOSIS — J449 Chronic obstructive pulmonary disease, unspecified: Secondary | ICD-10-CM | POA: Diagnosis not present

## 2018-04-12 DIAGNOSIS — I509 Heart failure, unspecified: Secondary | ICD-10-CM | POA: Diagnosis not present

## 2018-04-12 DIAGNOSIS — I4892 Unspecified atrial flutter: Secondary | ICD-10-CM | POA: Diagnosis not present

## 2018-04-12 DIAGNOSIS — D6869 Other thrombophilia: Secondary | ICD-10-CM | POA: Diagnosis not present

## 2018-04-28 ENCOUNTER — Telehealth: Payer: Self-pay | Admitting: Emergency Medicine

## 2018-04-28 NOTE — Telephone Encounter (Signed)
Ria Comment please look out for paperwork.

## 2018-04-28 NOTE — Telephone Encounter (Signed)
Spoke with [employer] and she needs RB to sign the medication list. She faxed the paper, will await the forms then route to RB to be signed.

## 2018-05-01 NOTE — Telephone Encounter (Signed)
Forms have been received and placed in RB's sign folder.

## 2018-05-01 NOTE — Telephone Encounter (Signed)
Ria Comment please advise if you have received the paperwork, thank you!

## 2018-05-01 NOTE — Telephone Encounter (Signed)
Spoke with [place] with Hospice. She is going to refax these forms to my attention.

## 2018-05-01 NOTE — Telephone Encounter (Signed)
I have not received any thing on the pt.

## 2018-05-02 NOTE — Telephone Encounter (Signed)
RB just returned to the office today. Form has been given to him to sign. Will update chart once it's returned to me.

## 2018-05-02 NOTE — Telephone Encounter (Signed)
[name] asking for call back once forms have been faxed back, CB is [phone].  Fax# [phone].

## 2018-05-02 NOTE — Telephone Encounter (Signed)
Ria Comment, please provide with an update once available. Thank you.

## 2018-05-03 ENCOUNTER — Ambulatory Visit: Payer: Medicare Other | Admitting: Physician Assistant

## 2018-05-03 NOTE — Progress Notes (Deleted)
Cardiology Office Note:    Date:  05/03/2018   ID:  [name], [name] [birth year], MRN [id number]  PCP:  [name], DO  Cardiologist:  Sherren Mocha, MD *** Electrophysiologist:  None   Referring MD: [name], DO   No chief complaint on file. ***  History of Present Illness:    [name] is a 73 y.o. 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  She was admitted in 11/2017 with acute on chronic respiratory failure secondary to acute diastolic congestive heart failure and COPD exacerbation complicated by R pleural effusion.  She required IV diuresis and R thoracentesis.  Venous dopplers were neg for DVT.   She was last seen in 01/2018.  She was admitted twice in October 2019 since last seen in clinic.  She was admitted with community-acquired pneumonia and then readmitted with a/c respiratory failure with recurrent R pleural effusion and volume excess.  She was treated with IV Lasix and thoracentesis.  She was noted to have a recurrent R pleural effusion on CT scan in 03/2018.  [name] referred her for PleurX catheter.  She is now on Hospice.  She did have a visit to the ED 03/31/18 with shortness of breath and developed SVT.  She developed asystole with adenosine administration.  ***  [name] ***  Prior CV studies:   The following studies were reviewed today:  *** Echo 12/11/17 EF 60-65, no RWMA, Gr 1 DD, trivial AI, PASP 33  Echo 02/18/17 Mild LVH, EF 60-65, no RWMA, Gr 1 DD, Ao sclerosis without stenosis, mild AI, trivial MR, RVH, noremal RVSF, trivial effusion  Echo 7/15 EF 60-65, normal wall motion, grade 1 diastolic dysfunction, mild AI, mild LAE  Cardiac MRI 01/2011 Impression: 1) Small posterior pericardial effusion 2) Large epicardial fat pad 3)  Normal EF 62% 4) No hyperenhancement 5) Mild LAE 6) Suspicious 3.5cm lesion in the RUL concerning for lung caner  Nuclear stress test10/2012 Probably normal stress nuclear study with a small fixed anterior defect suggestive of breast attenuation but no ischemia.EF 89  Cardiac Catheterization10/2006 Vigorous LVF  LAD 30-50 LCx irregularities  Past Medical History:  Diagnosis Date  . Adenocarcinoma, lung ([place])    upper left lobe  . Anemia in neoplastic disease   . Anxiety   . Arthritis    "all over my body"  . Asthma   . Asymptomatic varicose veins   . Atherosclerosis of native arteries of the extremities, unspecified   . Atrial fibrillation ([place])   . Bronchial pneumonia    history  . 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 Chemotherapy adverse reaction 09/15/11   "makes me light headed and pass out; this is 3rd blood transfusion since going thru it"  . CHF (congestive heart failure) ([place])   . Chronic airway obstruction, not elsewhere classified   . Chronic fatigue 08/19/2015  . Chronic sinusitis   . Complication of anesthesia    slow to wake up  . COPD with emphysema ([place])   . Coronary artery disease    Dr. Burt Knack had stress test done  . DDD (degenerative disc disease), lumbar   . Depressive disorder, not elsewhere classified   . Diseases of lips   . Disorder of bone and cartilage, unspecified   . DVT (deep venous thrombosis) ([place])   . Fibromyalgia   .  Functional urinary incontinence   . GERD (gastroesophageal reflux disease)   . Hard of hearing, right   . Herpes zoster without mention of complication   . Hip pain, chronic 09/17/2014  . History of blood clots    "2 in my aorta"  . History of blood transfusion   . History of bronchitis   . History of echocardiogram    Echo 11/18: mild LVH, EF 60-65, no RWMA, Gr 1 DD, Ao Sclerosis, mild AI, RVH with low nl RSVF, trivial eff  . Hx of radiation therapy 04/28/11 -06/08/11   right upper lobe-lung  .  Hyperlipidemia   . Hypertension   . Kidney infection    "related to chemo"  . Lumbago   . Malignant neoplasm of bronchus and lung, unspecified site   . Mental disorder   . Muscle weakness (generalized)   . Non-small cell lung cancer ([place]) 03/31/2011   Adenocarcinoma Rt upper lobe mass  . Osteoarthrosis, unspecified whether generalized or localized, unspecified site   . Osteoporosis   . Other abnormal blood chemistry   . Other and unspecified hyperlipidemia   . PAD (peripheral artery disease) ([place])   . Pain in thoracic spine   . 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 Primary cancer of right upper lobe of lung ([place]) 02/08/2011   Biopsy Rt upper lobe mass 03/31/11>>adenocarcinoma DIAGNOSIS: Stage IIB/IIIA non-small cell lung cancer consistent with adenocarcinoma diagnosed in December 2012.   PRIOR THERAPY:  1) Status post radiation therapy to the right upper lobe lung mass completed on 06/04/2011 under the care of Dr. Pablo Ledger.  2) Systemic chemotherapy with carboplatin for AUC of 5 and Alimta 500 mg/M2 giving every 3   . Reflux   . Reflux esophagitis   . Respiratory failure with hypoxia 01/02/2010  . Restless legs syndrome (RLS)   . Scoliosis   . Screening for thyroid disorder   . Shortness of breath    "anytime really"  . Sinus headache    "all the time"  . Spasm of muscle   . Syncope and collapse   . Type II or unspecified type diabetes mellitus with neurological manifestations, uncontrolled(250.62)   . Type II or unspecified type diabetes mellitus without mention of complication, not stated as uncontrolled   . Type II or unspecified type diabetes mellitus without mention of complication, not stated as uncontrolled   . Unspecified constipation   . Unspecified essential hypertension   . Unspecified hearing loss   . Unspecified hereditary and idiopathic peripheral neuropathy   . Unspecified vitamin D deficiency    Surgical Hx: The patient  has a past surgical history that  includes Lung lobectomy (1986); Septoplasty; Total hip arthroplasty (01/2010); Bronchoscopy (02/2011); Dilation and curettage of uterus (1980's); Tubal ligation (1980's); Cardiac catheterization; Fetal blood transfusion (09/16/11); Joint replacement (04/13/10); Cataract extraction (03/12/2013); Eye surgery (Left, 2014); IR Guided Drain W Catheter Placement (04/04/2018); and IR US Guide Bx Asp/Drain (04/04/2018).   Current Medications: No outpatient medications have been marked as taking for the 05/03/18 encounter (Appointment) with Richardson Dopp T, PA-C.     Allergies:   Iohexol; Other; Anoro ellipta [umeclidinium-vilanterol]; Codeine; and Protonix [pantoprazole sodium]   Social History   Tobacco Use  . Smoking status: Former Smoker    Packs/day: 1.00    Years: 40.00    Pack years: 40.00    Types: Cigarettes    Last attempt to quit: 02/10/1998    Years since quitting: 20.2  . Smokeless tobacco: Never Used  Substance  Use Topics  . Alcohol use: No  . Drug use: No     Family Hx: The patient's family history includes Arthritis in her daughter; Bone cancer in her father; Cancer in her father; Deep vein thrombosis in her mother; Diabetes in her mother; Heart disease in her mother; Hyperlipidemia in her mother; Hypertension in her mother; Varicose Veins in her mother.  ROS:   Please see the history of present illness.    ROS All other systems reviewed and are negative.   EKGs/Labs/Other Test Reviewed:    EKG:  EKG is *** ordered today.  The ekg ordered today demonstrates ***  Recent Labs: 01/27/2018: B Natriuretic Peptide 39.5 02/07/2018: TSH 0.838 02/10/2018: Magnesium 1.3 03/24/2018: ALT 11; NT-Pro BNP 336 04/04/2018: BUN 25; Creatinine, Ser 1.02; Hemoglobin 10.7; Platelets 383; Potassium 3.4; Sodium 138   Recent Lipid Panel Lab Results  Component Value Date/Time   CHOL 173 02/10/2018 03:06 AM   CHOL 207 (H) 12/18/2013 11:22 AM   TRIG 275.0 (H) 07/04/2017 04:45 PM   HDL 61.10  07/04/2017 04:45 PM   HDL 63 12/18/2013 11:22 AM   CHOLHDL 3 07/04/2017 04:45 PM   LDLCALC 96 11/22/2016 02:25 PM   LDLCALC 110 (H) 12/18/2013 11:22 AM   LDLDIRECT 131.0 07/04/2017 04:45 PM    Physical Exam:    VS:  There were no vitals taken for this visit.    Wt Readings from Last 3 Encounters:  04/04/18 155 lb (70.3 kg)  03/24/18 150 lb 6.4 oz (68.2 kg)  03/03/18 156 lb (70.8 kg)     ***Physical Exam  ASSESSMENT & PLAN:    No diagnosis found.***  Chronic diastolic heart failure (HCC)  Echocardiogram September 2019 with normal LV function and mild diastolic dysfunction.  Overall, volume status appears stable on exam.  She does have significant lower extremity swelling.  I suspect that this is multifactorial.  On exam, her lungs are clear and her neck veins are flat.  Given her symptoms, I will obtain a BMET, BNP.  BNP is significantly elevated, I will increase her Lasix.  Other chest pain Somewhat atypical.  She had nonobstructive disease by cardiac catheterization in 2006.  Given her advanced lung disease and recurrent cancer, I do not think she is a candidate for Cardiac Catheterization.  Therefore, I do not think that stress testing will be helpful.  Her ECG does not demonstrate acute changes.  I will give her a Rx for NTG to use as needed.  She can continue ASA.  Sinus tachycardia  She has a chronic hx of sinus tachycardia.  This is probably related to ongoing lung issues.  I do not think her blood pressure will tolerate increasing her beta-blocker further. Continue current Rx.   Primary cancer of right upper lobe of lung ([place]) Continue follow up with oncology and pulmonology as planned.  Leg swelling  I suspect her leg swelling is multifactorial.  Her albumin in 12/2017 was 2.6.  I suspect hypoalbuminemia is contributing.  I have encouraged her to keep her legs elevated, wear compression stockings, increase protein in her diet, use Glucerna and discuss managing her  protein further with oncology.     Dispo:  No follow-ups on file.   Medication Adjustments/Labs and Tests Ordered: Current medicines are reviewed at length with the patient today.  Concerns regarding medicines are outlined above.  Tests Ordered: No orders of the defined types were placed in this encounter.  Medication Changes: No orders of the defined types were  placed in this encounter.   Signed, Richardson Dopp, PA-C  05/03/2018 1:36 PM    [facility] [place], [place], [place]  [address] Phone: [phone]; Fax: [phone]

## 2018-05-03 NOTE — Telephone Encounter (Signed)
Forms have been signed and faxed back to Hospice. Nothing further was needed.

## 2018-05-04 ENCOUNTER — Encounter: Payer: Self-pay | Admitting: Physician Assistant

## 2018-05-13 DIAGNOSIS — 419620001 Death: Secondary | SNOMED CT | POA: Diagnosis not present

## 2018-05-13 DEATH — deceased

## 2018-05-25 ENCOUNTER — Telehealth: Payer: Self-pay | Admitting: Pulmonary Disease

## 2018-05-25 NOTE — Telephone Encounter (Signed)
05/25/2018 1542  Received form from [place] requesting information regarding [facility] hospice and patient's tenure with them.  I attempted to contact [facility] hospice to get start date for patient so I can complete forms.  Had to leave a message with medical records at [facility].  They should contact our office back.  Forms are in Wyn Quaker look at folder which will need to be faxed to [place] when completed.  Wyn Quaker, FNP

## 2018-05-29 NOTE — Telephone Encounter (Signed)
[phone] I have call hospice to see when the patient started on hospice services.lmom

## 2018-06-02 NOTE — Telephone Encounter (Signed)
[name] was able to call back and give me the start date of patient was 02/14/18. This information was put on the form and faxed to the number at the top of the form.   Nothing further needed at this time.

## 2018-06-02 NOTE — Telephone Encounter (Signed)
[name] with Medical records. Left a message for her to call back office so we can get paperwork completed.

## 2018-11-20 IMAGING — DX DG CHEST 1V
1 series · 1 of 1 positions shown · non-contrast
Comparison: PA and lateral chest 12/09/2017 and 11/08/2017.

CLINICAL DATA: Status post right thoracentesis today.

EXAM:
CHEST  1 VIEW

[chest ap]
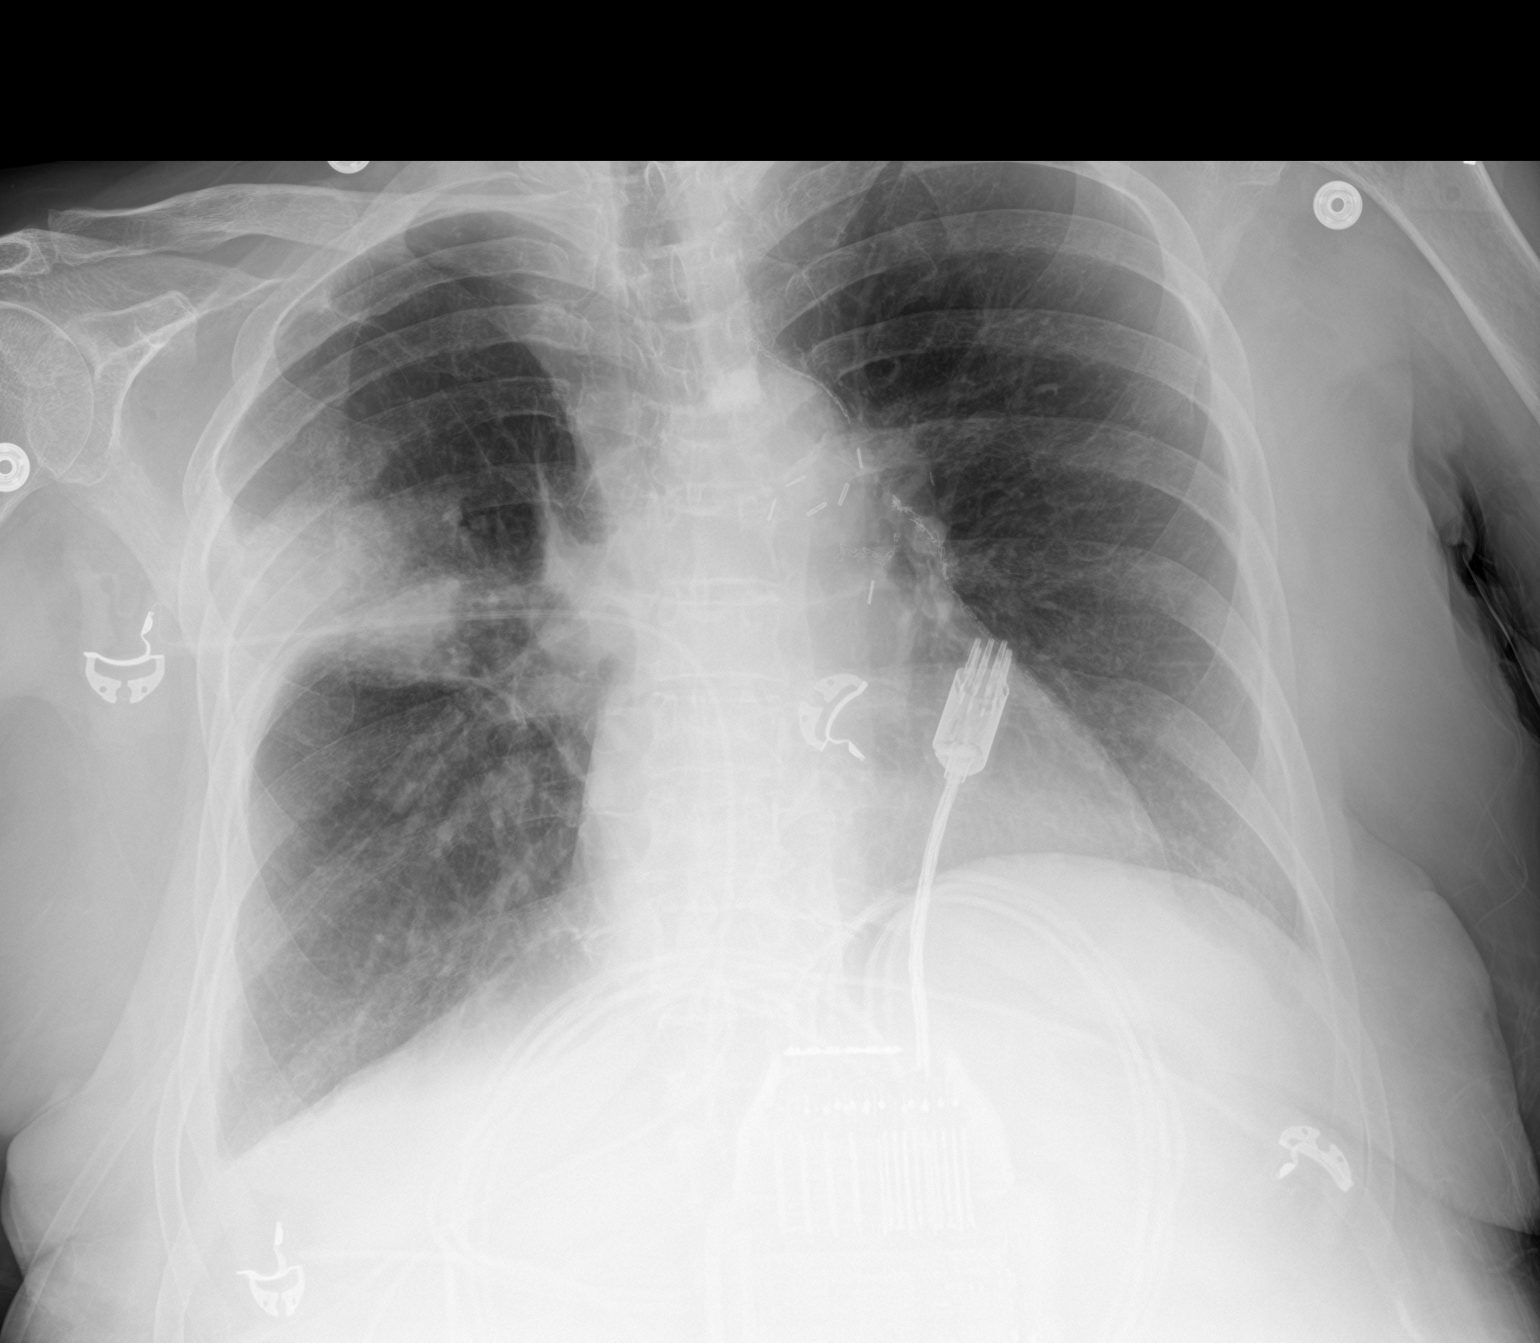

[1 of 1 positions shown; findings below may reference images not displayed]

FINDINGS: Right pleural effusion is markedly decreased after thoracentesis. No
pneumothorax. Right upper lobe opacity seen on prior examinations is
unchanged. Left lung is clear. Heart size is normal.
IMPRESSION: Negative for pneumothorax after thoracentesis.

No change in a masslike right upper lobe opacity.

## 2018-12-19 IMAGING — DX DG CHEST 2V
2 series · 2 of 2 positions shown · non-contrast
Comparison: 12/13/2017.

CLINICAL DATA: Shortness of breath.  Fever.  Edema.

EXAM:
CHEST - 2 VIEW

[chest pa]
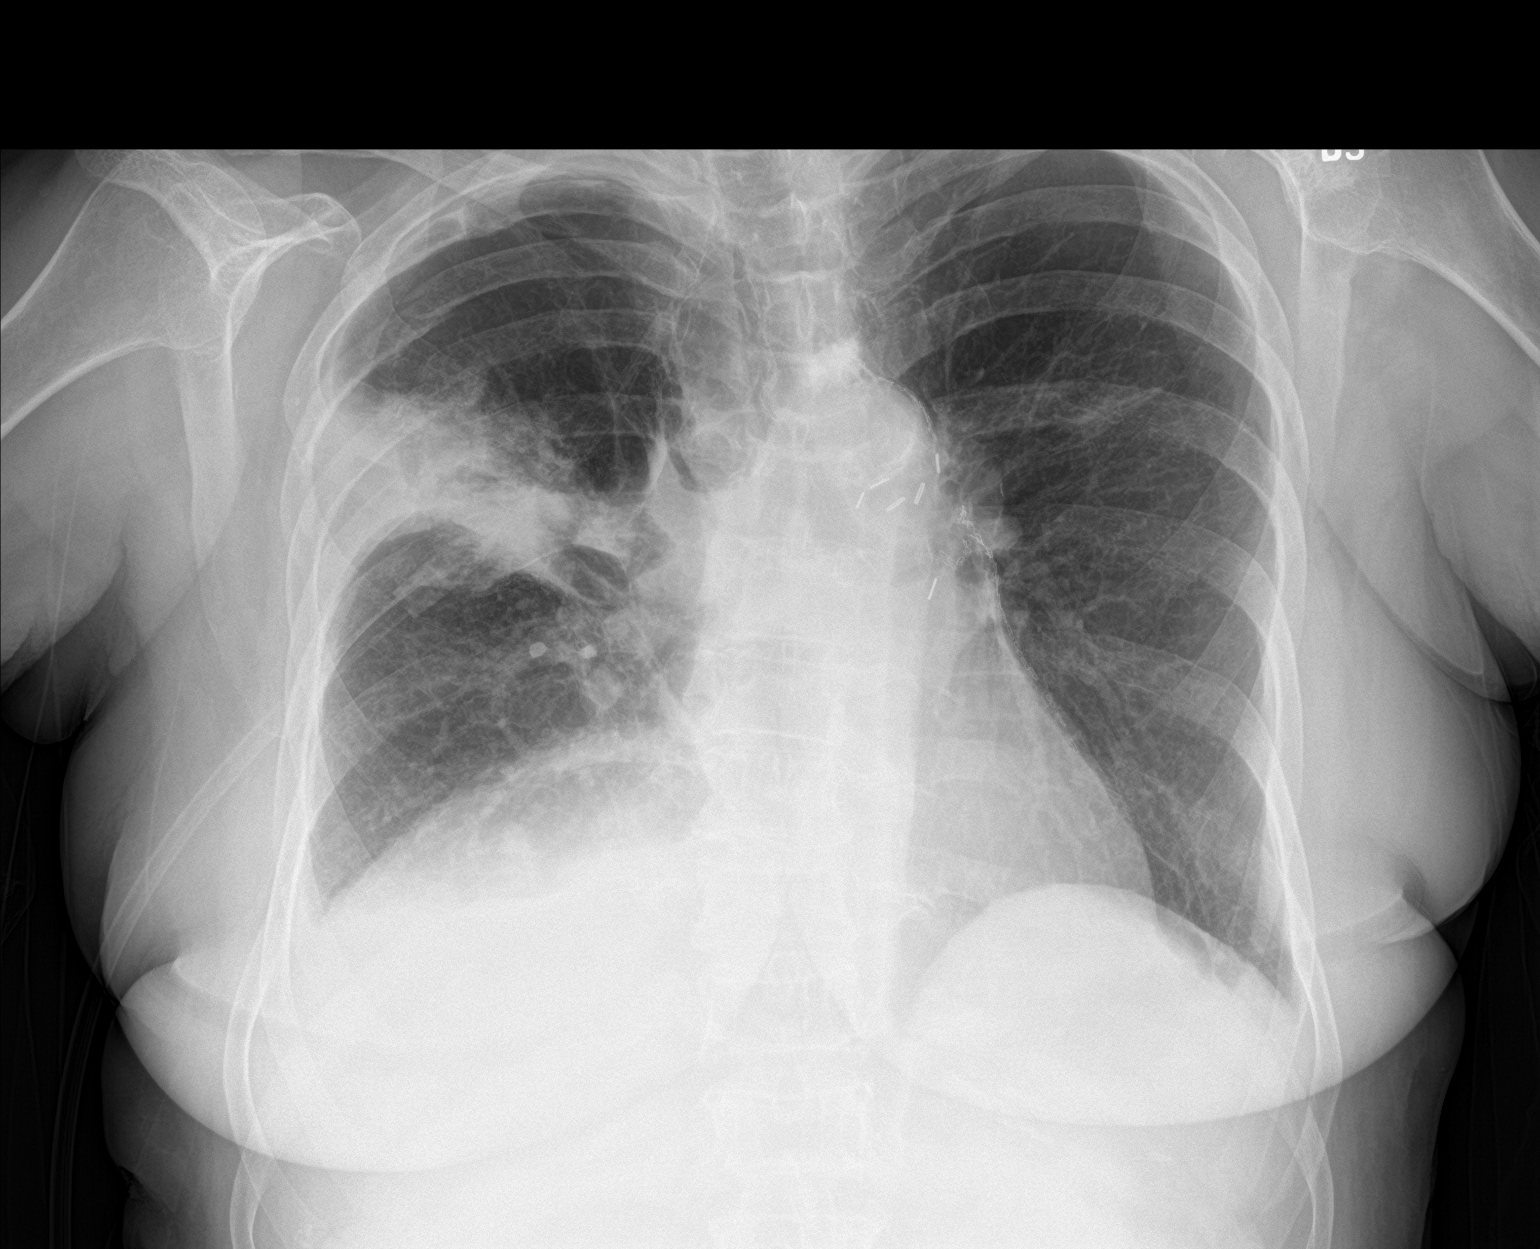

[chest lat]
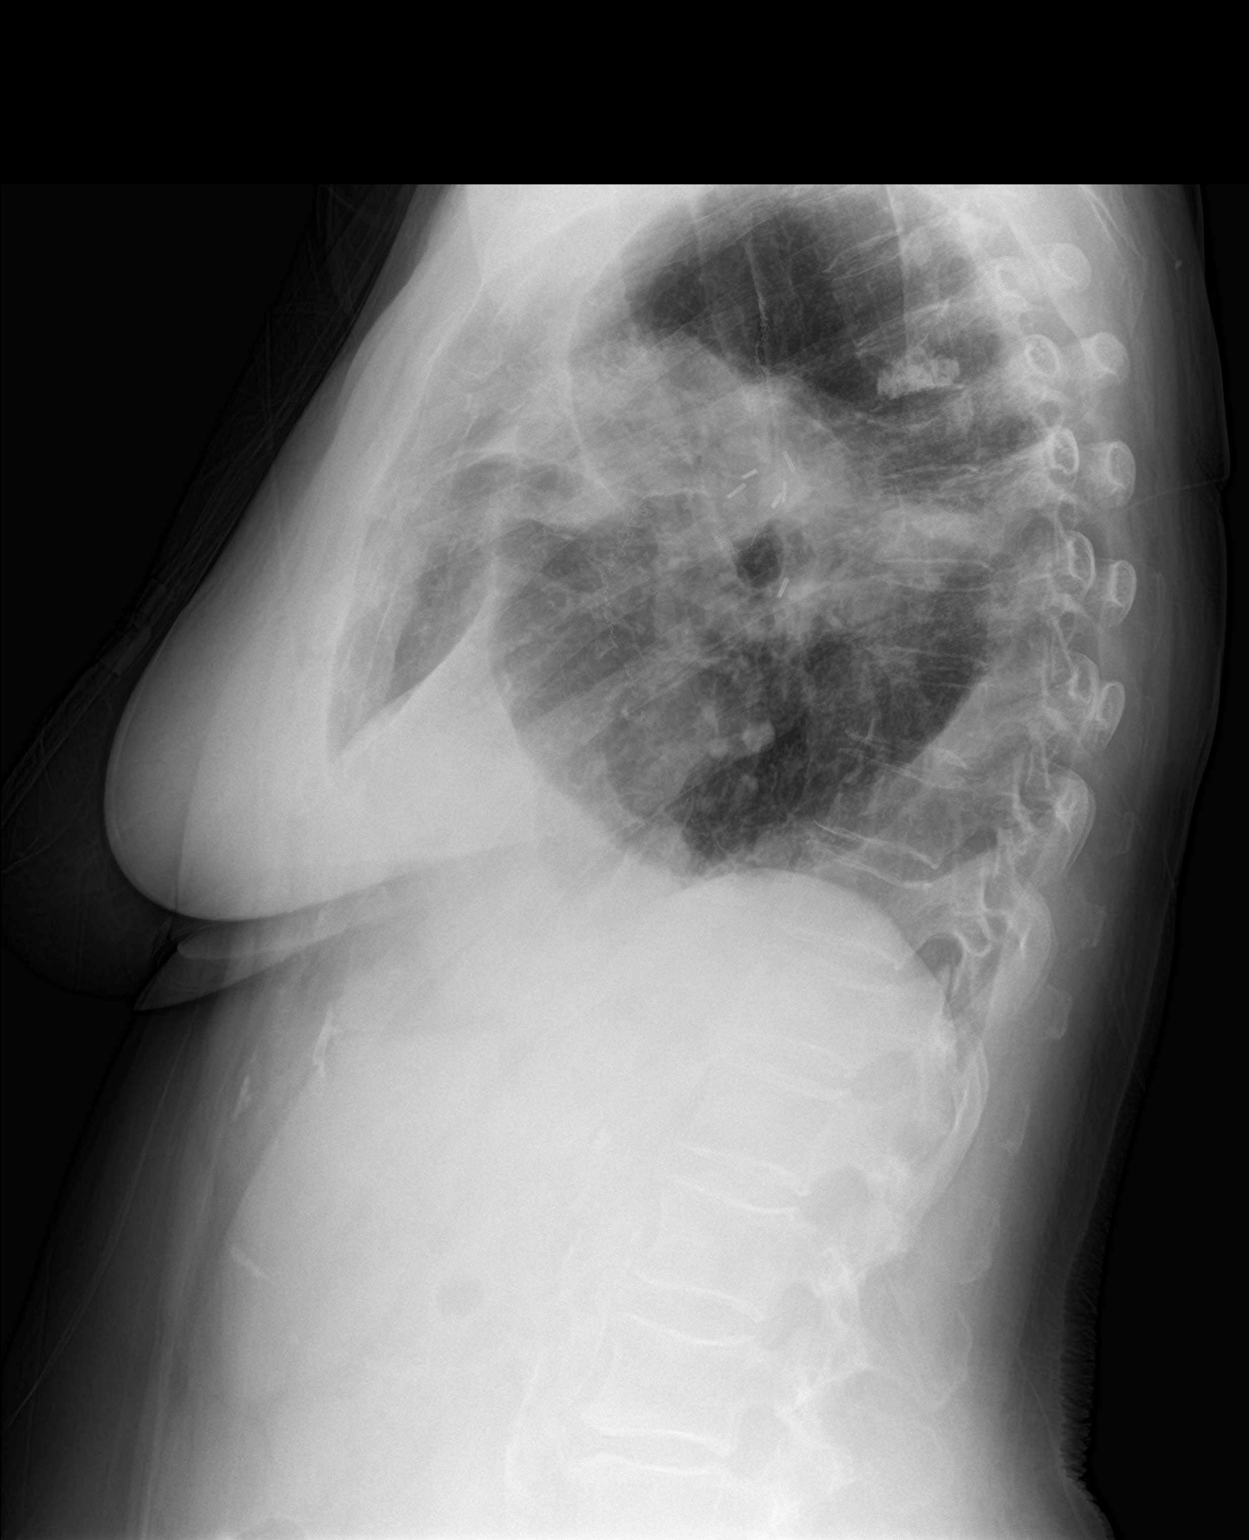

[2 of 2 positions shown; findings below may reference images not displayed]

FINDINGS: Surgical clips and sutures noted over the chest. Heart size normal.
Persistent right upper lung mass/consolidation right upper lung
without significant change. Associated right hilar adenopathy may be
present. Small right pleural effusion again noted. Mild left upper
lobe subsegmental atelectasis. No pneumothorax.
IMPRESSION: 1. Right upper lung mass/consolidation without significant change.
Associated right hilar adenopathy may be present. Associated small
right pleural effusion again noted.

2.  Mild left upper lobe subsegmental atelectasis.

## 2022-08-18 ENCOUNTER — Encounter: Payer: Self-pay | Admitting: Internal Medicine
